# Patient Record
Sex: Female | Born: 1940 | Race: White | Hispanic: No | Marital: Married | Smoking: Never smoker
Health system: Southern US, Community
[De-identification: ages and names within clinical notes are randomized; demographics above are authoritative.]

## PROBLEM LIST (undated history)

## (undated) DIAGNOSIS — I639 Cerebral infarction, unspecified: Secondary | ICD-10-CM

## (undated) DIAGNOSIS — E039 Hypothyroidism, unspecified: Secondary | ICD-10-CM

## (undated) DIAGNOSIS — C801 Malignant (primary) neoplasm, unspecified: Secondary | ICD-10-CM

## (undated) DIAGNOSIS — I7300 Raynaud's syndrome without gangrene: Secondary | ICD-10-CM

## (undated) HISTORY — PX: EYE SURGERY: SHX253

## (undated) HISTORY — DX: Hypothyroidism, unspecified: E03.9

## (undated) HISTORY — DX: Cerebral infarction, unspecified: I63.9

## (undated) HISTORY — DX: Raynaud's syndrome without gangrene: I73.00

---

## 1989-03-25 HISTORY — PX: APPENDECTOMY: SHX54

## 1989-03-25 HISTORY — PX: ABDOMINAL HYSTERECTOMY: SHX81

## 1993-03-25 HISTORY — PX: CHOLECYSTECTOMY: SHX55

## 2008-03-25 HISTORY — PX: BACK SURGERY: SHX140

## 2010-01-23 ENCOUNTER — Ambulatory Visit: Payer: Self-pay | Admitting: Internal Medicine

## 2010-01-23 IMAGING — MG MM CAD SCREENING MAMMO
1 series · 4 of 4 positions shown · non-contrast
Comparison: Mammograms from [facility] dated [DATE] and
[DATE].

REASON FOR EXAM: SCR
COMMENTS:

PROCEDURE:     MAM - MAM DGTL SCREENING MAMMO W/CAD  - [DATE] [DATE]
RESULT:

[Series 9589: R CC · right · 4 of 4 slices shown]
[im 1/4]
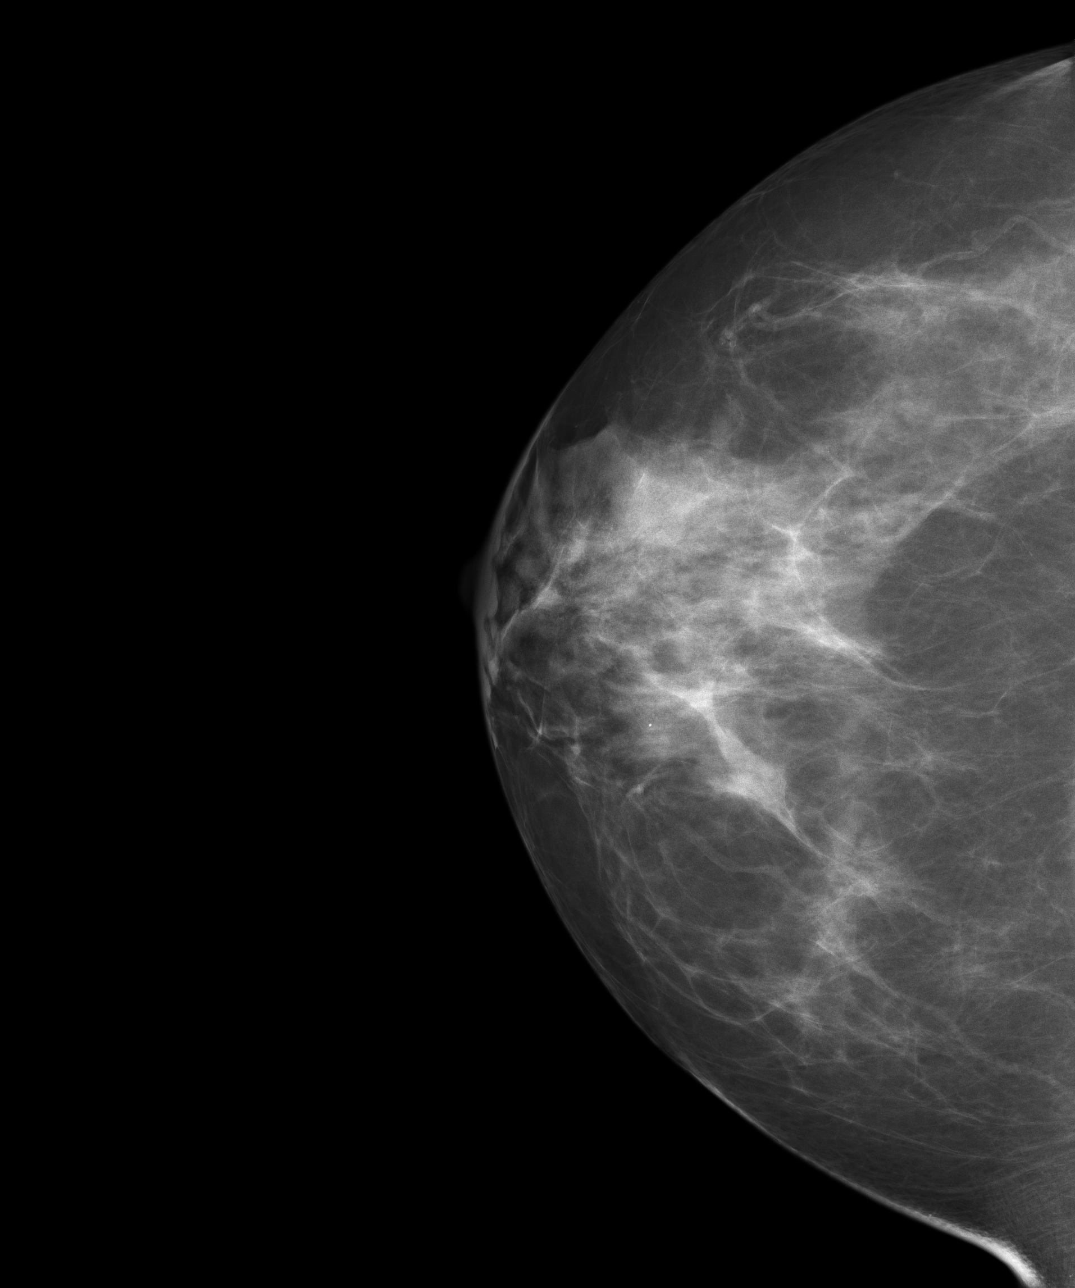
[im 2/4]
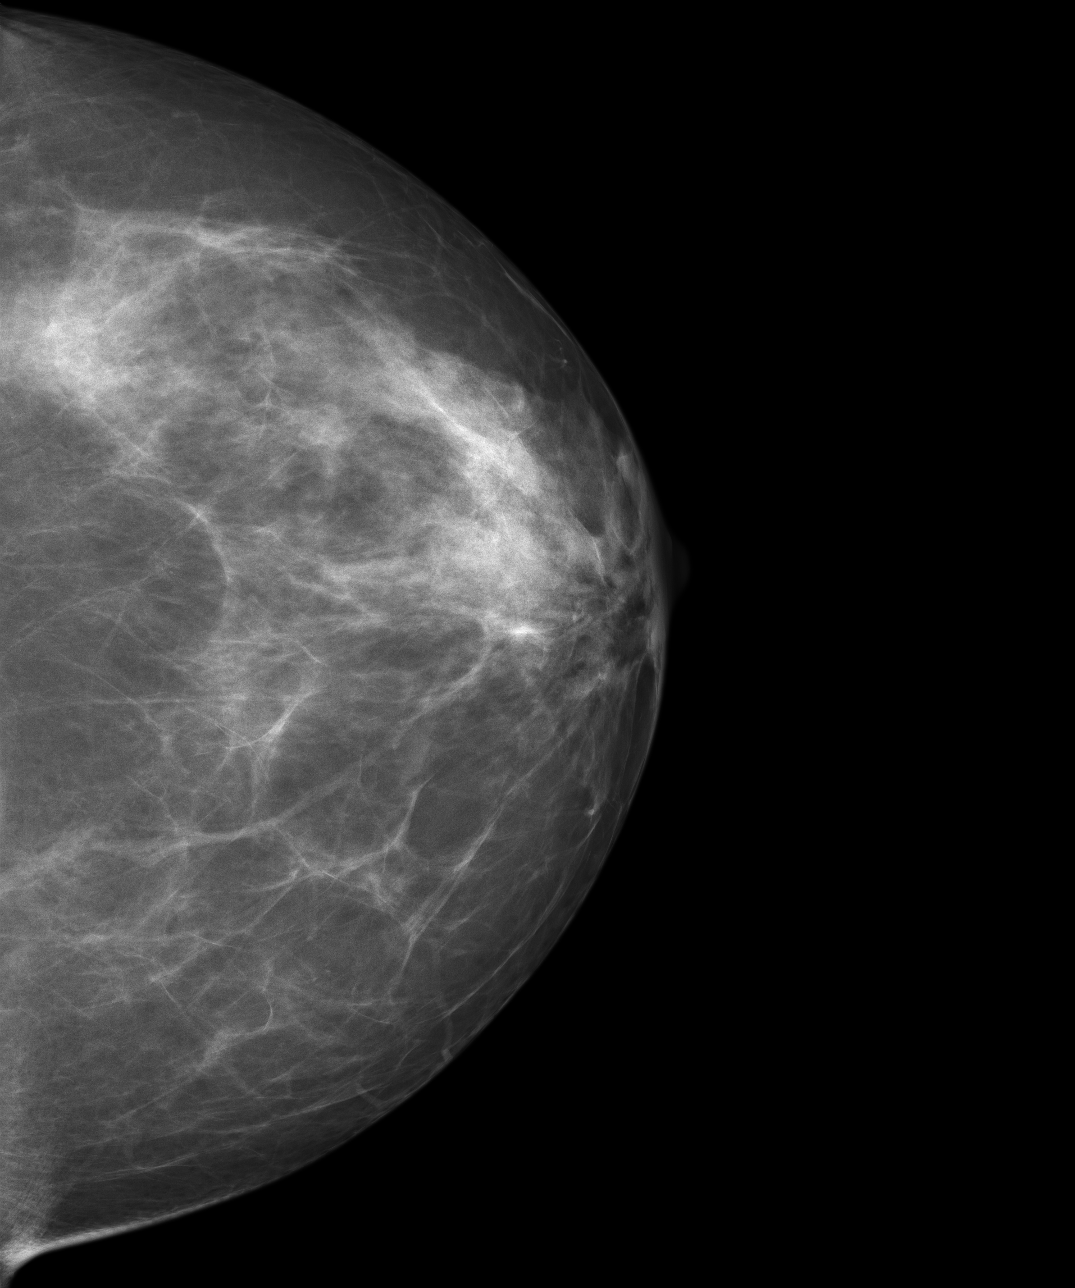
[im 3/4]
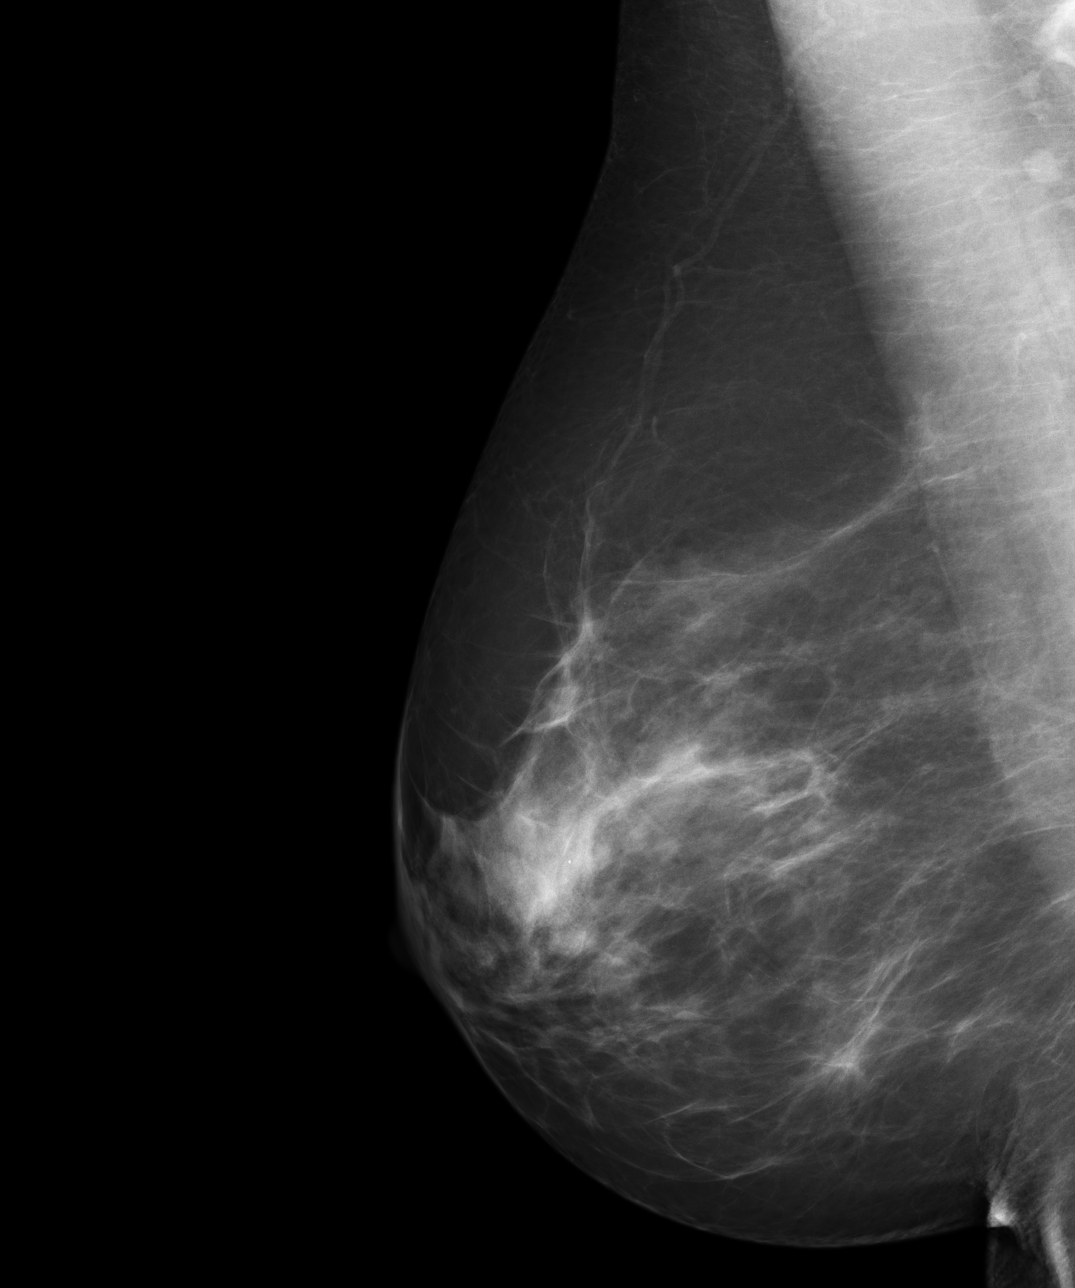
[im 4/4]
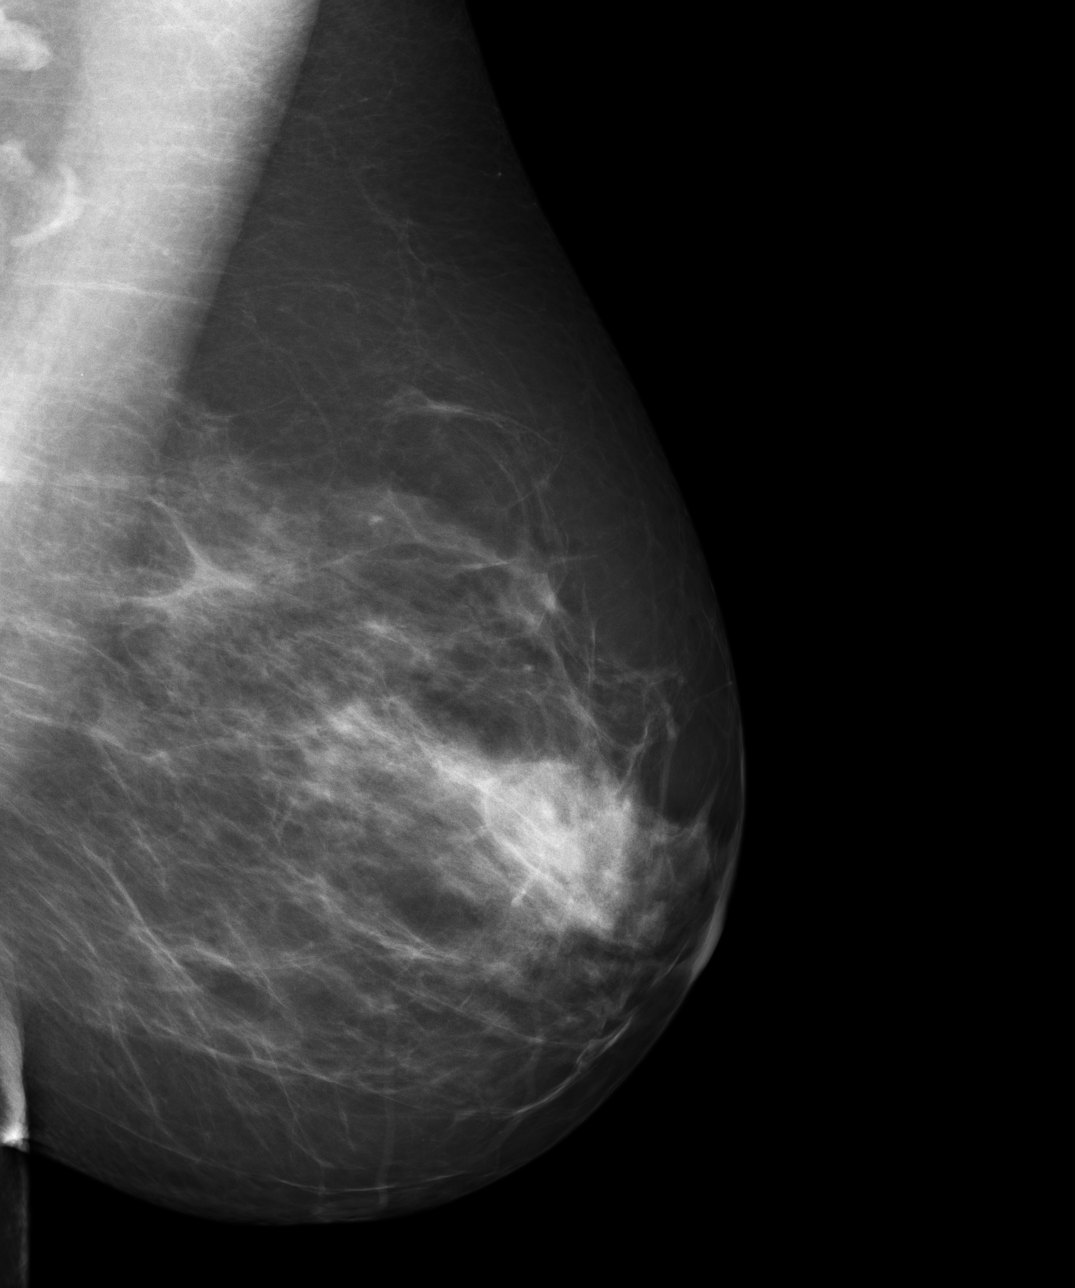

[4 of 4 positions shown; findings below may reference images not displayed]

FINDINGS: The breast tissue is heterogeneously dense.  No new or suspicious
masses or calcifications are identified. No areas of architectural
distortion.
IMPRESSION: BI-RADS: Category 1 - Negative

Continue annual screening mammography.

A NEGATIVE MAMMOGRAM REPORT DOES NOT PRECLUDE BIOPSY OR OTHER EVALUATION OF
A CLINICALLY PALPABLE OR OTHERWISE SUSPICIOUS MASS OR LESION. BREAST CANCER
MAY NOT BE DETECTED BY MAMMOGRAPHY IN UP TO 10% OF CASES.

## 2011-01-29 ENCOUNTER — Ambulatory Visit: Payer: Self-pay | Admitting: Internal Medicine

## 2011-01-29 IMAGING — MG MM CAD SCREENING MAMMO
1 series · 4 of 4 positions shown · non-contrast
Comparison: none

REASON FOR EXAM: scr mammo
COMMENTS:

PROCEDURE:     MAM - MAM DGTL SCREENING MAMMO W/CAD  - [DATE]  [DATE]
RESULT:

[Series 6373: R CC · right · 4 of 4 slices shown]
[im 1/4]
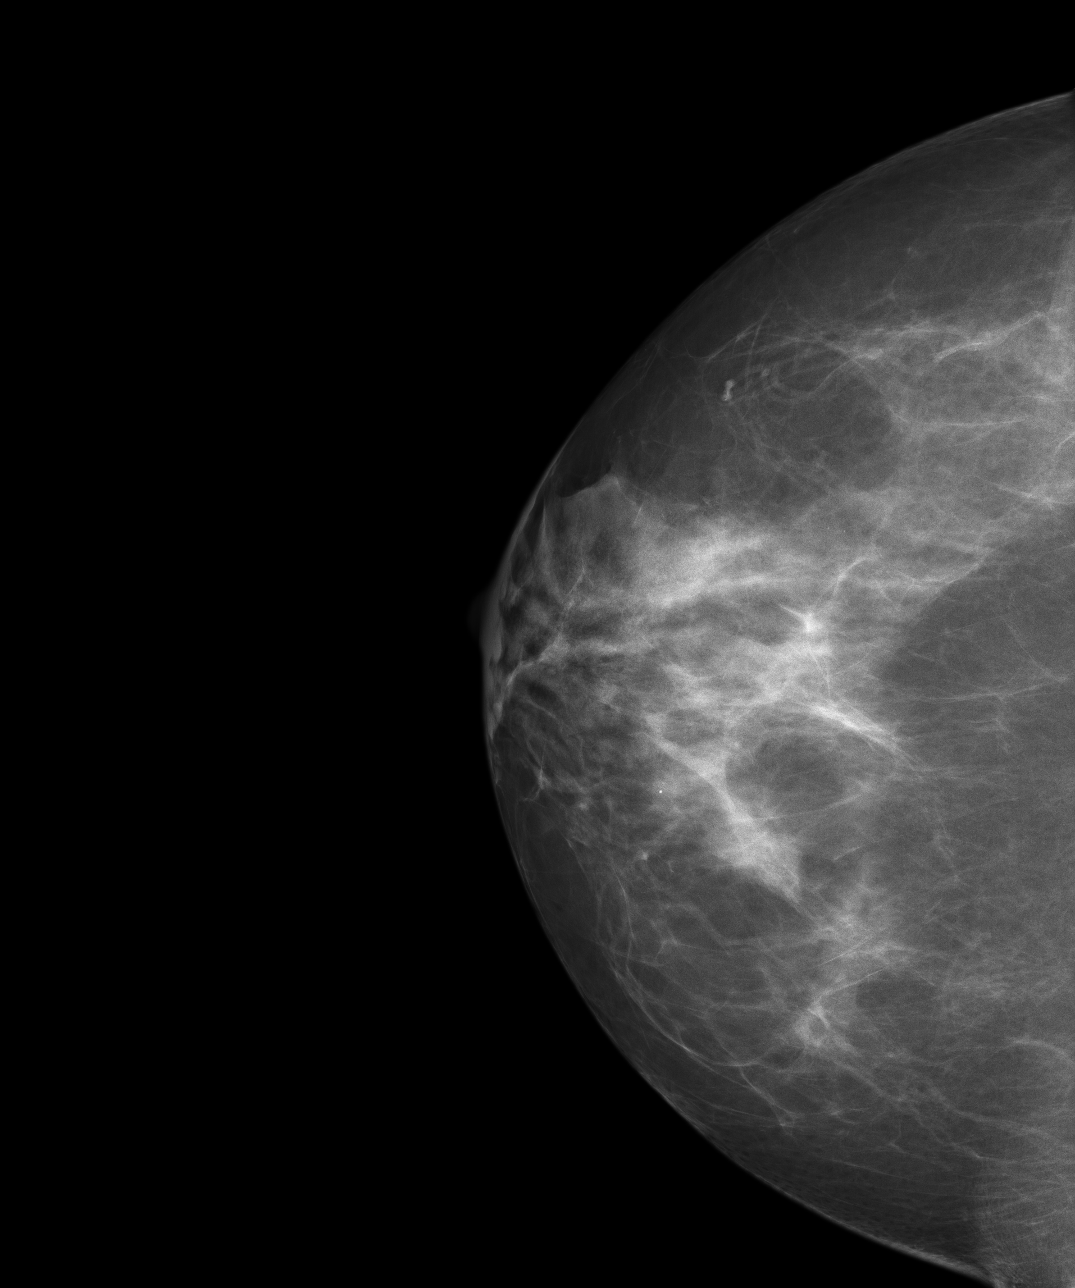
[im 2/4]
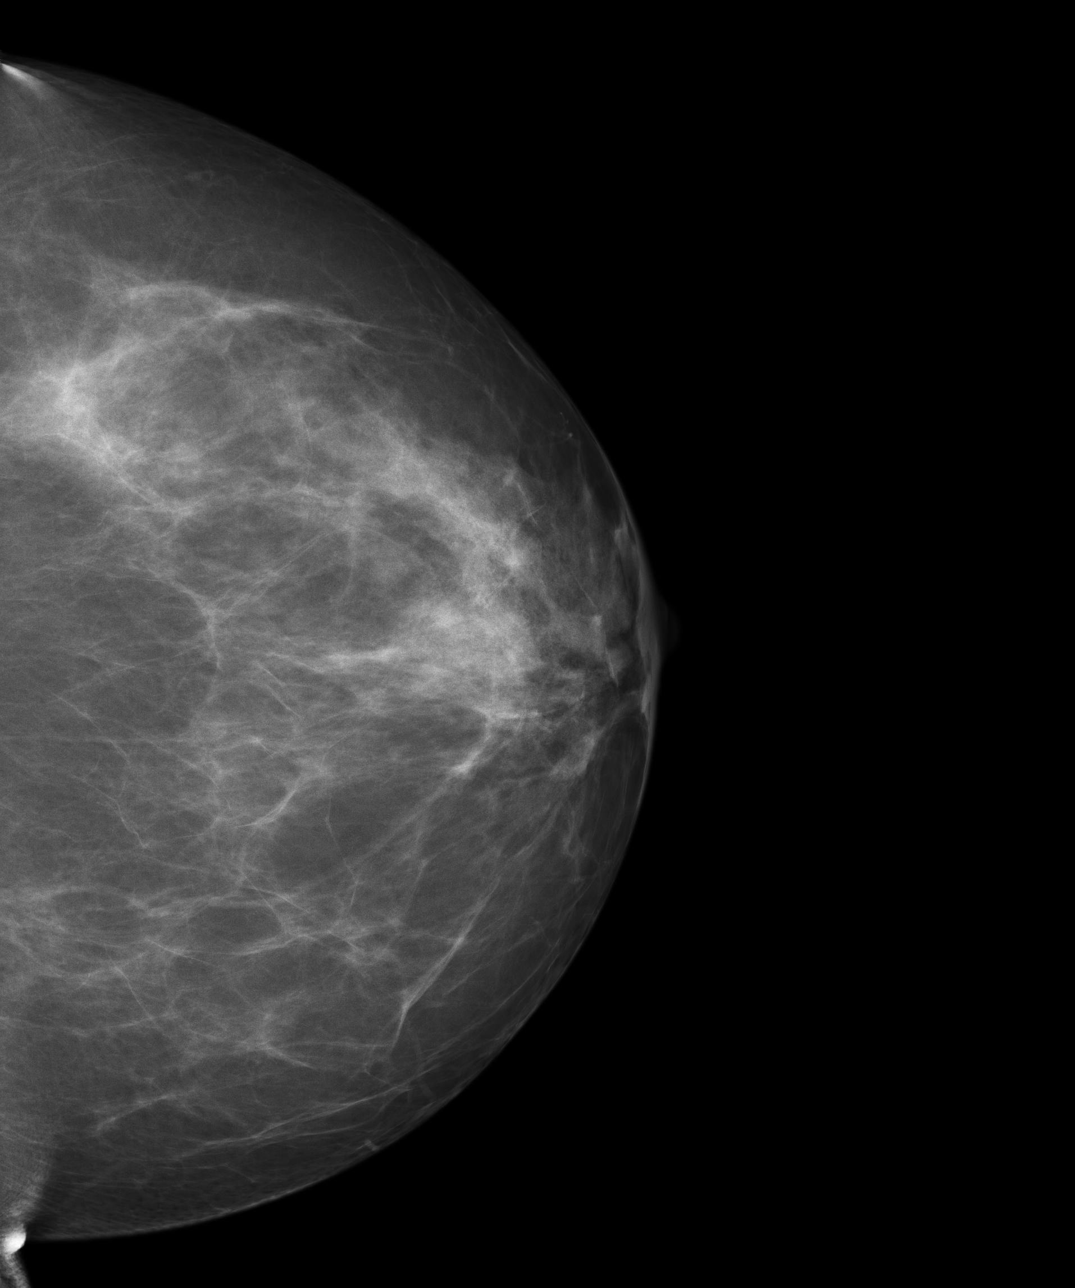
[im 3/4]
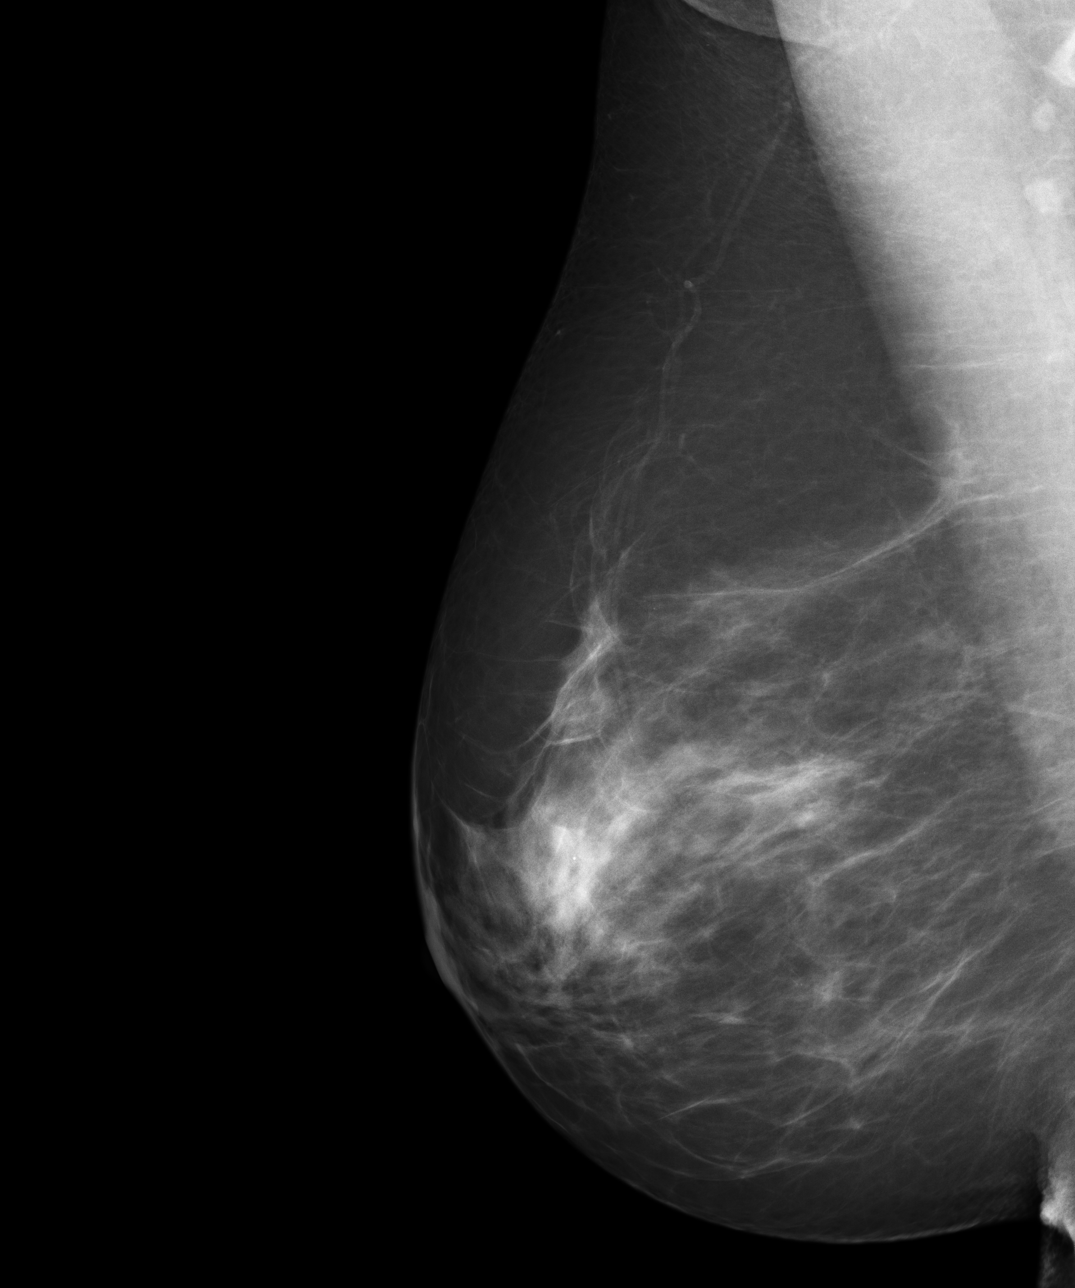
[im 4/4]
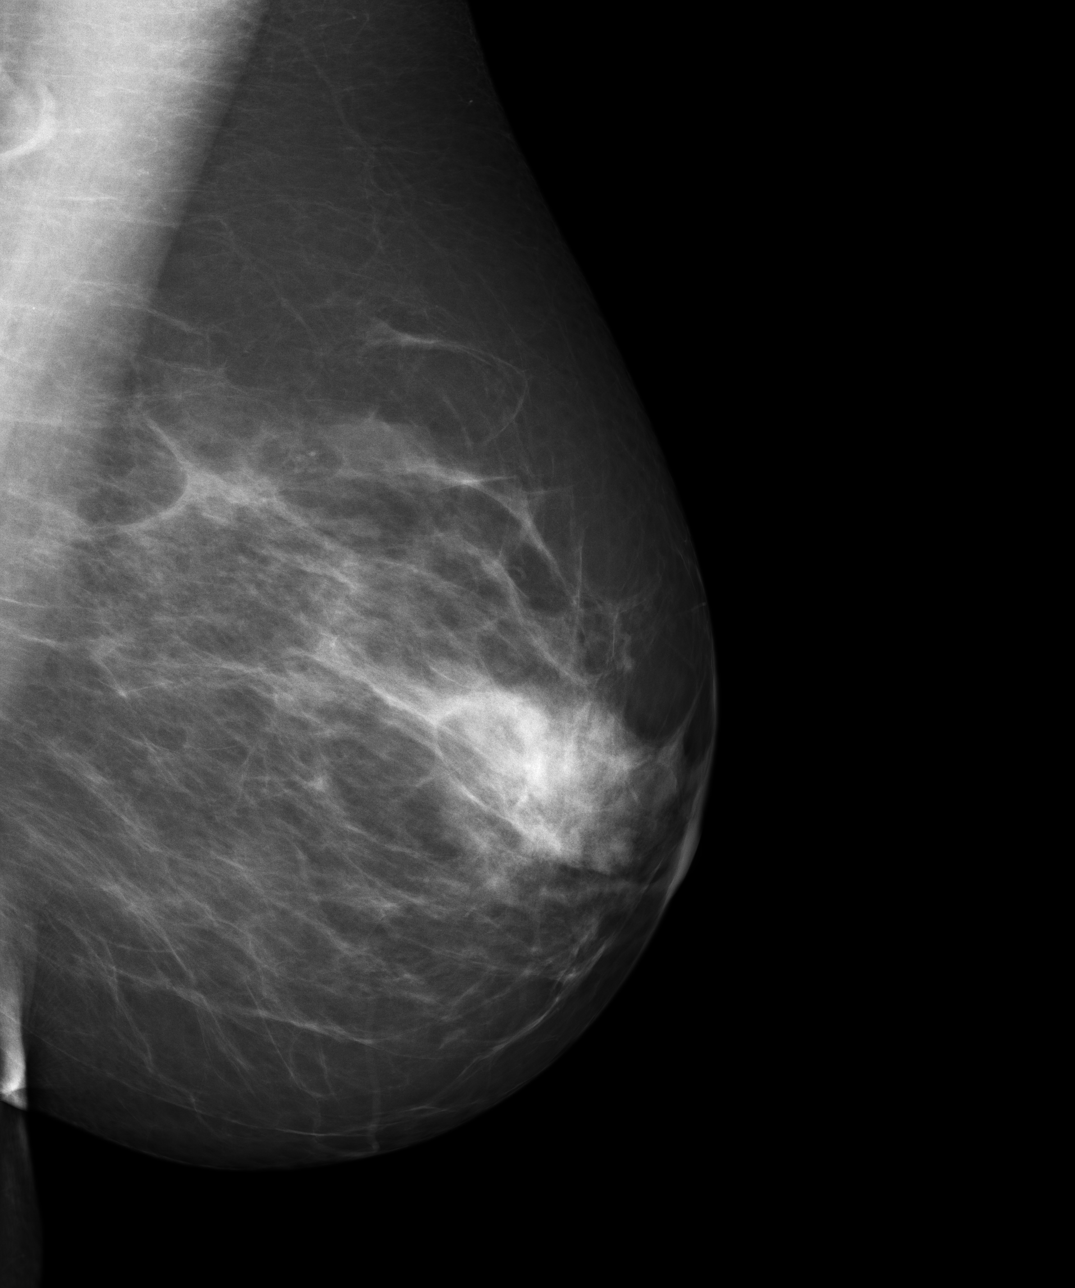

[4 of 4 positions shown; findings below may reference images not displayed]

FINDINGS: Comparison is made to the previous images from [facility]
[REDACTED] at [facility] in [place] [facility]
dated [DATE] and [DATE] as well as to digital images from [HOSPITAL] dated
[DATE].

The breasts exhibit a heterogeneous, moderate to dense parenchymal pattern
without demonstrating an area of developing parenchymal density, dominant
mass, malignant calcification or architectural distortion.
IMPRESSION: Stable, benign appearing bilateral mammogram.

BI-RADS: Category 2 - Benign Findings

RECOMMENDATION:  Please continue to encourage annual mammographic follow-up.

Thank you for this opportunity to contribute to the care of your patient.

A NEGATIVE MAMMOGRAM REPORT DOES NOT PRECLUDE BIOPSY OR OTHER EVALUATION OF
A CLINICALLY PALPABLE OR OTHERWISE SUSPICIOUS MASS OR LESION. BREAST CANCER
MAY NOT BE DETECTED BY MAMMOGRAPHY IN UP TO 10% OF CASES.

## 2011-05-06 ENCOUNTER — Ambulatory Visit: Payer: Self-pay | Admitting: Gastroenterology

## 2011-05-08 LAB — PATHOLOGY REPORT

## 2011-06-27 ENCOUNTER — Ambulatory Visit: Payer: Self-pay | Admitting: Podiatry

## 2012-02-04 ENCOUNTER — Ambulatory Visit: Payer: Self-pay | Admitting: Internal Medicine

## 2012-02-04 IMAGING — MG MM CAD SCREENING MAMMO
1 series · 5 of 5 positions shown · non-contrast
Comparison: none

REASON FOR EXAM: SCR MAMMO NO ORDER
COMMENTS:

PROCEDURE:     MAM - MAM DGTL SCRN MAM NO ORDER W/CAD  - [DATE]  [DATE]
RESULT:     Heterogeneously dense breasts. No mass. No pathologic
calcifications. CAD evaluation nonfocal. No change from prior exams.

[R CC · right · 5 of 5 slices shown]
[im 1/5]
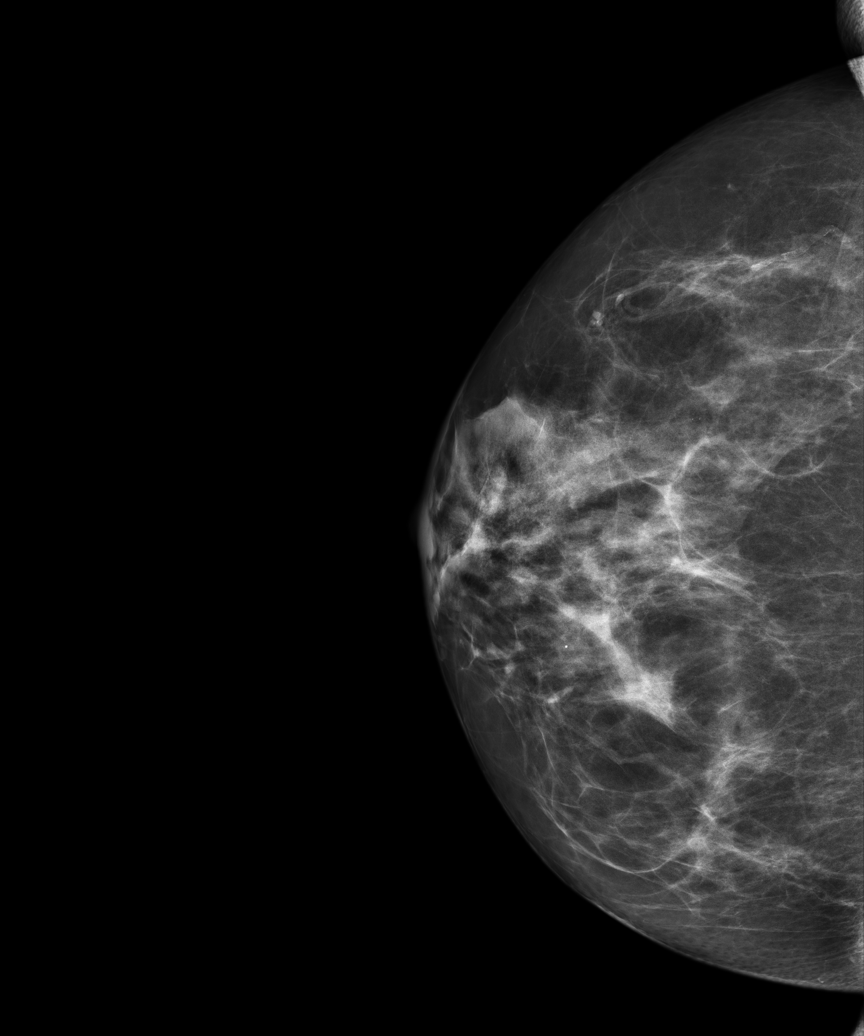
[im 2/5]
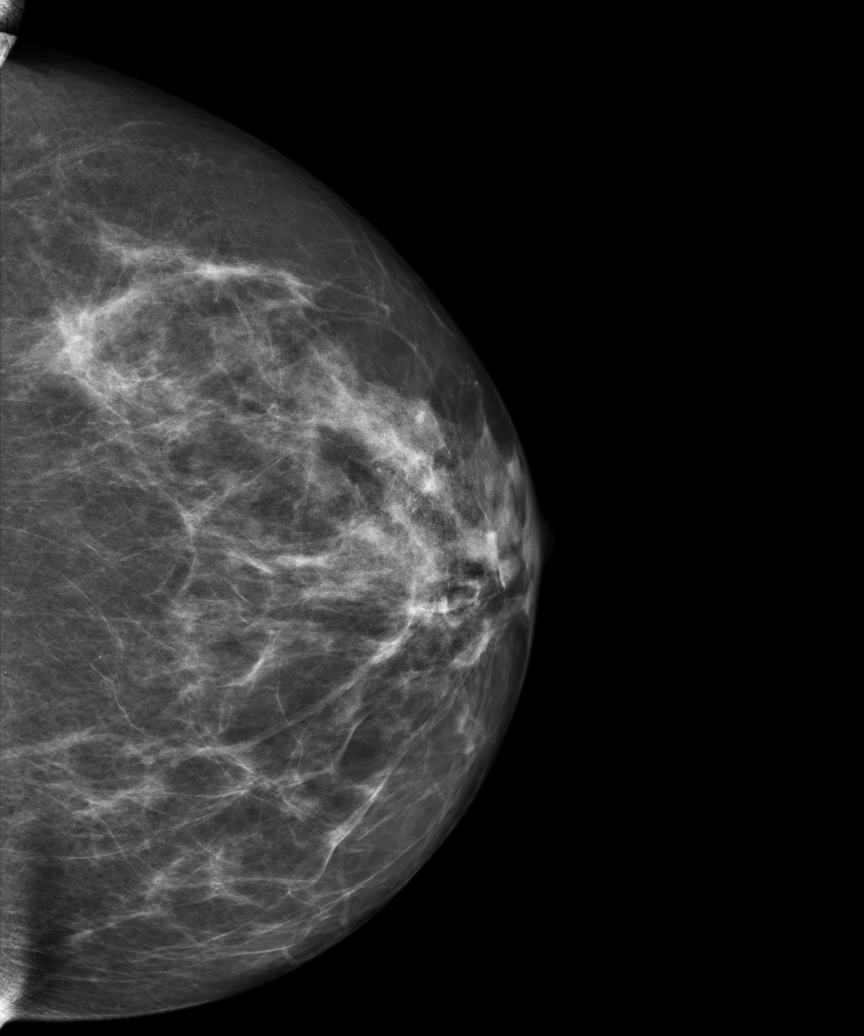
[im 3/5]
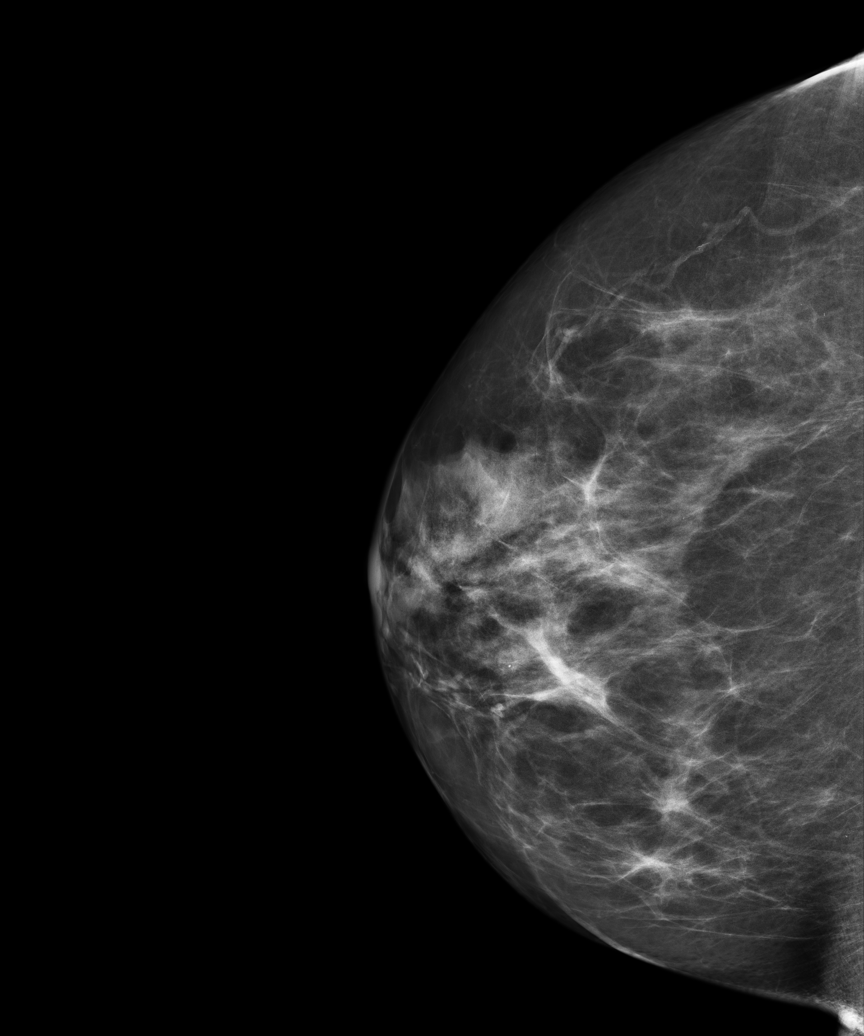
[im 4/5]
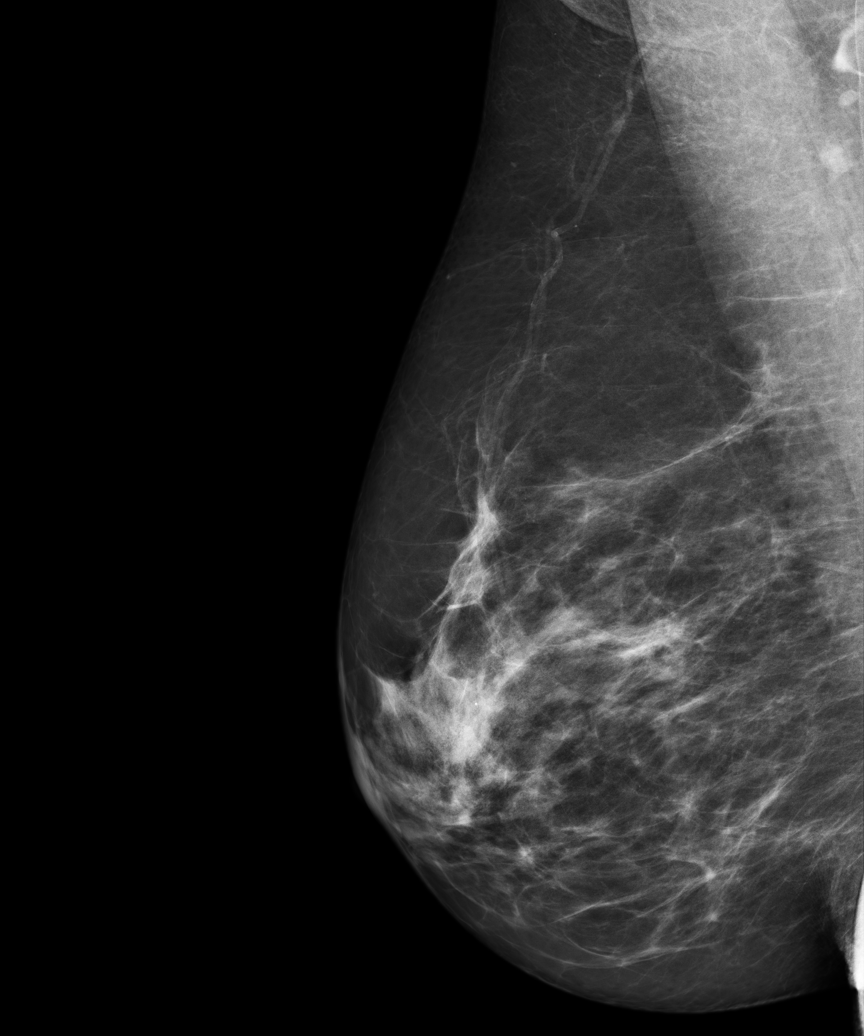
[im 5/5]
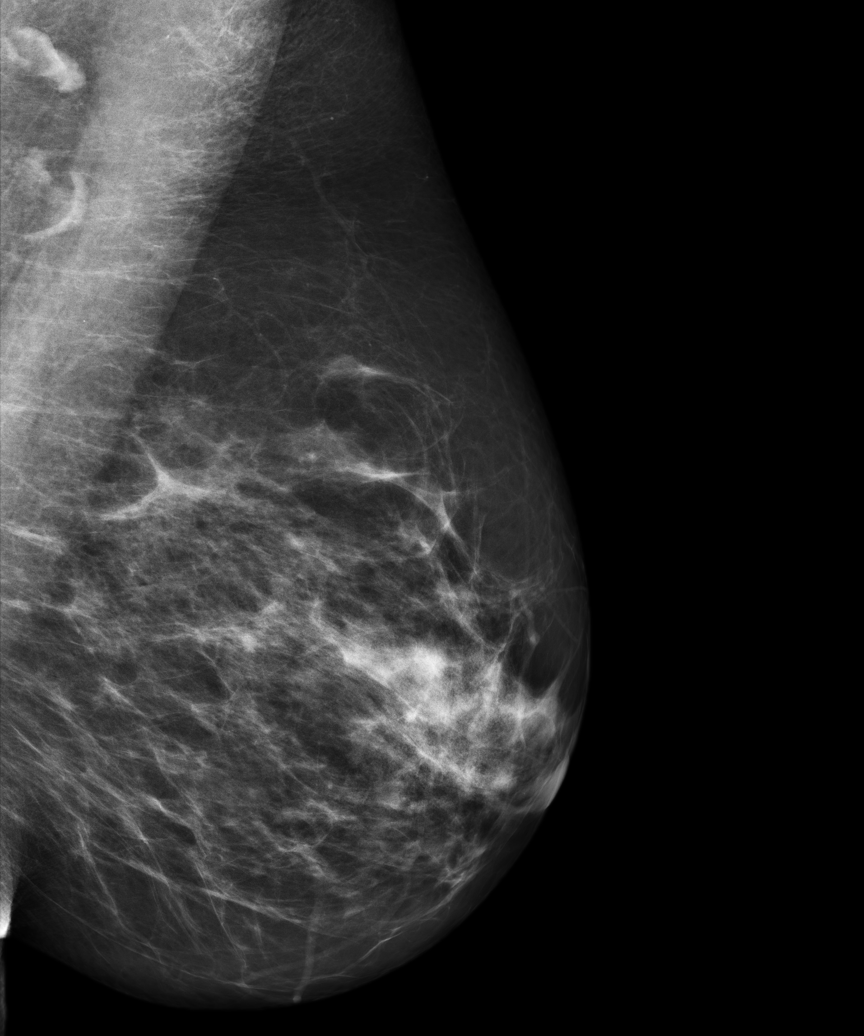

[5 of 5 positions shown; findings below may reference images not displayed]

IMPRESSION: Benign exam.

BI-RADS: Category 2- Benign Finding

A NEGATIVE MAMMOGRAM REPORT DOES NOT PRECLUDE BIOPSY OR OTHER EVALUATION OF
A CLINICALLY PALPABLE OR OTHERWISE SUSPICIOUS MASS OR LESION. BREAST CANCER
MAY NOT BE DETECTED IN UP TO 10% OF CASES.

## 2012-06-24 ENCOUNTER — Ambulatory Visit: Payer: Self-pay | Admitting: Family Medicine

## 2013-02-04 ENCOUNTER — Ambulatory Visit: Payer: Self-pay | Admitting: Internal Medicine

## 2013-02-04 IMAGING — MG MM DIGITAL SCREENING BILAT W/ CAD
1 series · 4 of 4 positions shown · non-contrast
Comparison: Previous exam(s).

CLINICAL DATA: Screening.

EXAM:
DIGITAL SCREENING BILATERAL MAMMOGRAM WITH CAD

[R CC · right · 4 of 4 slices shown]
[im 1/4]
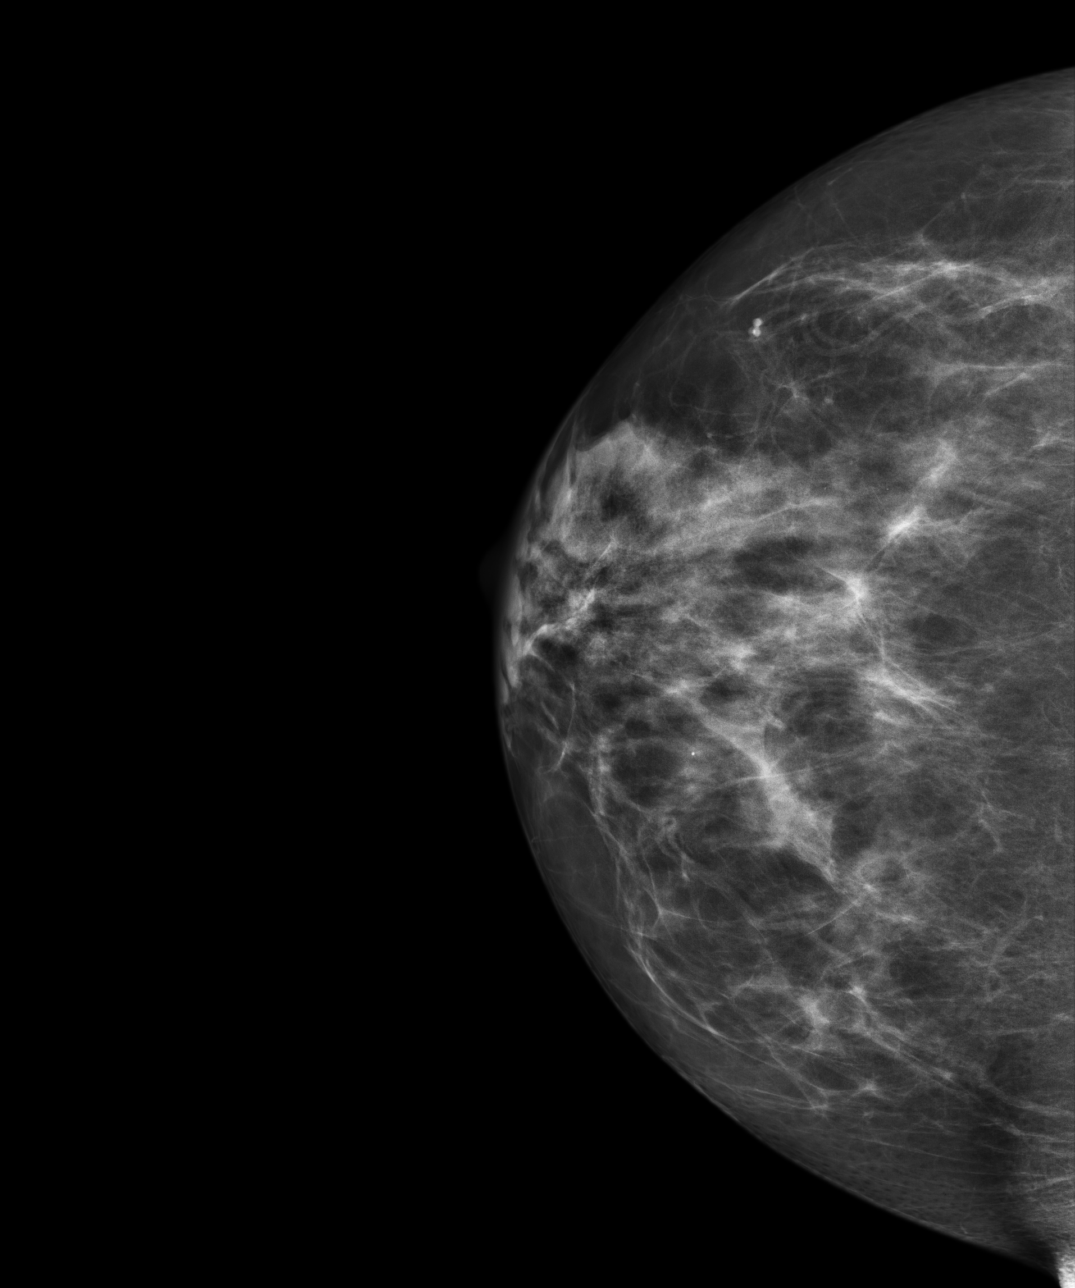
[im 2/4]
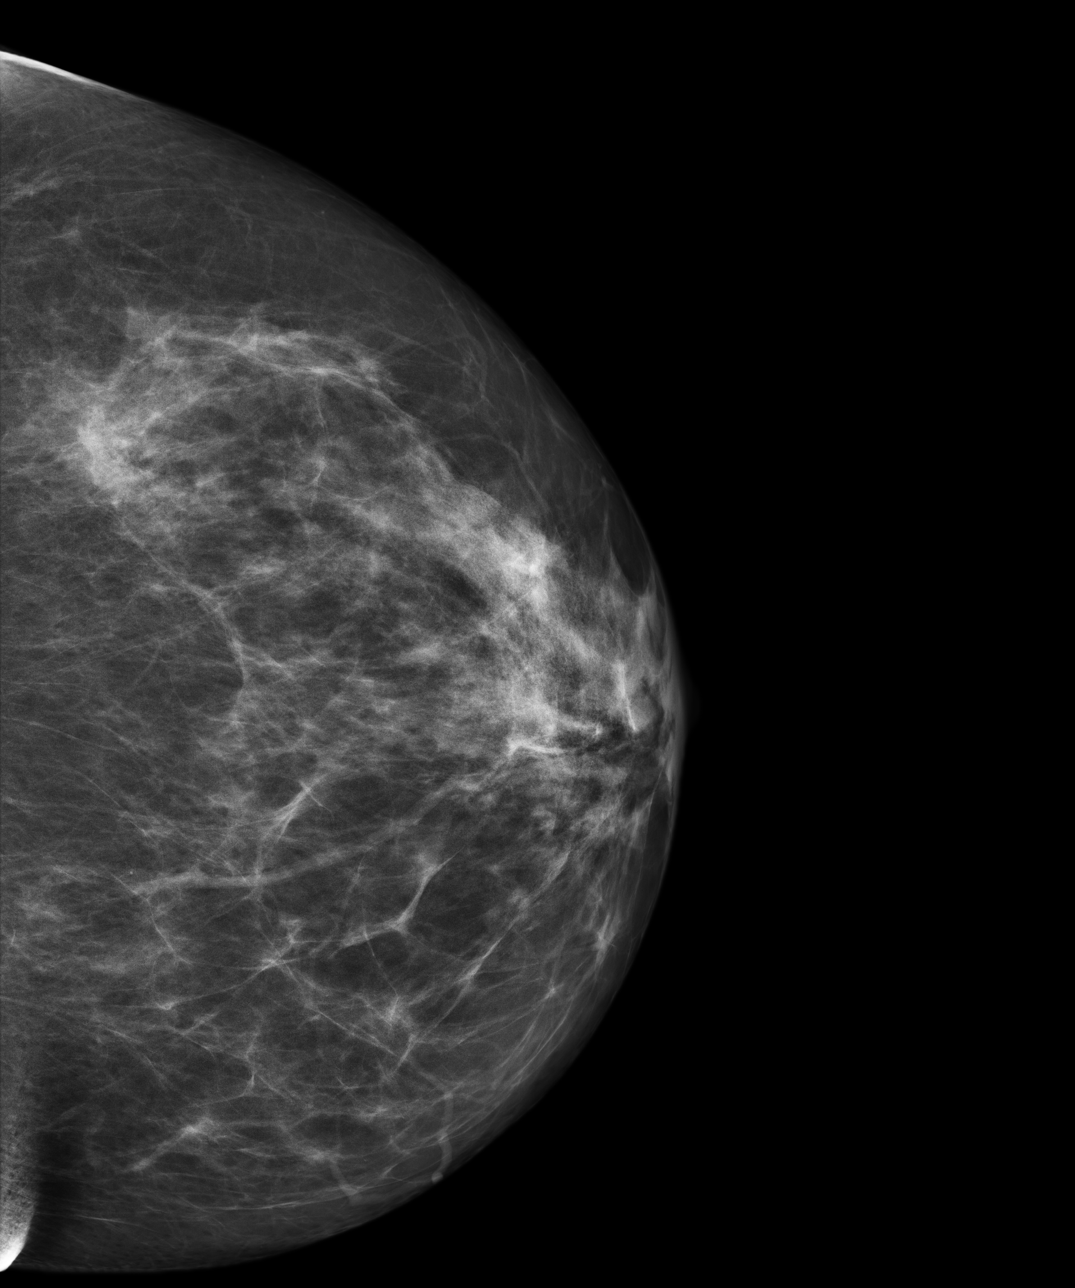
[im 3/4]
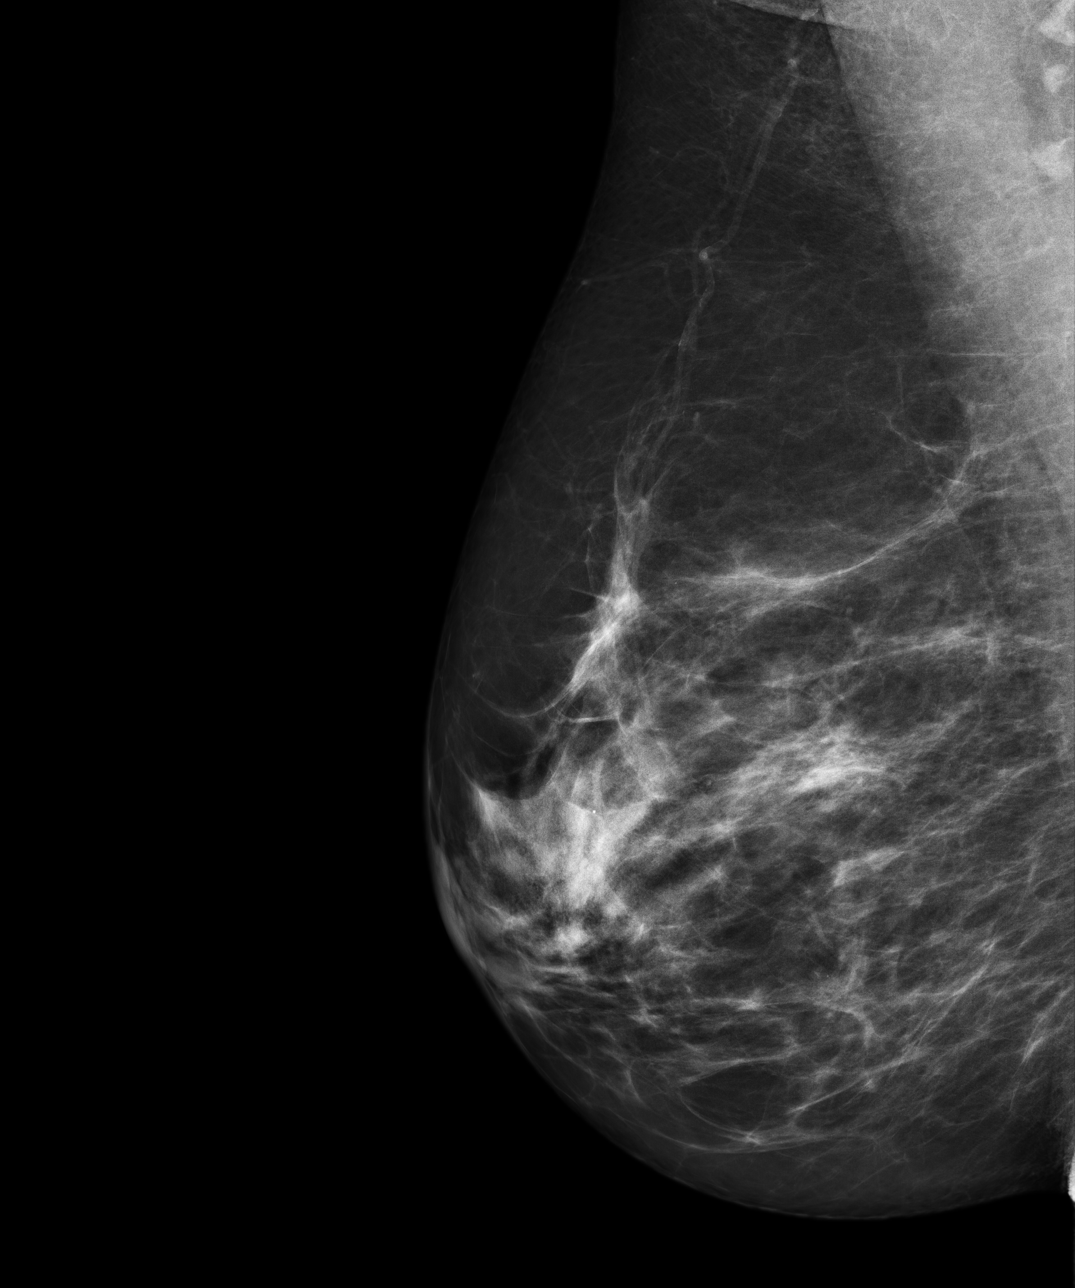
[im 4/4]
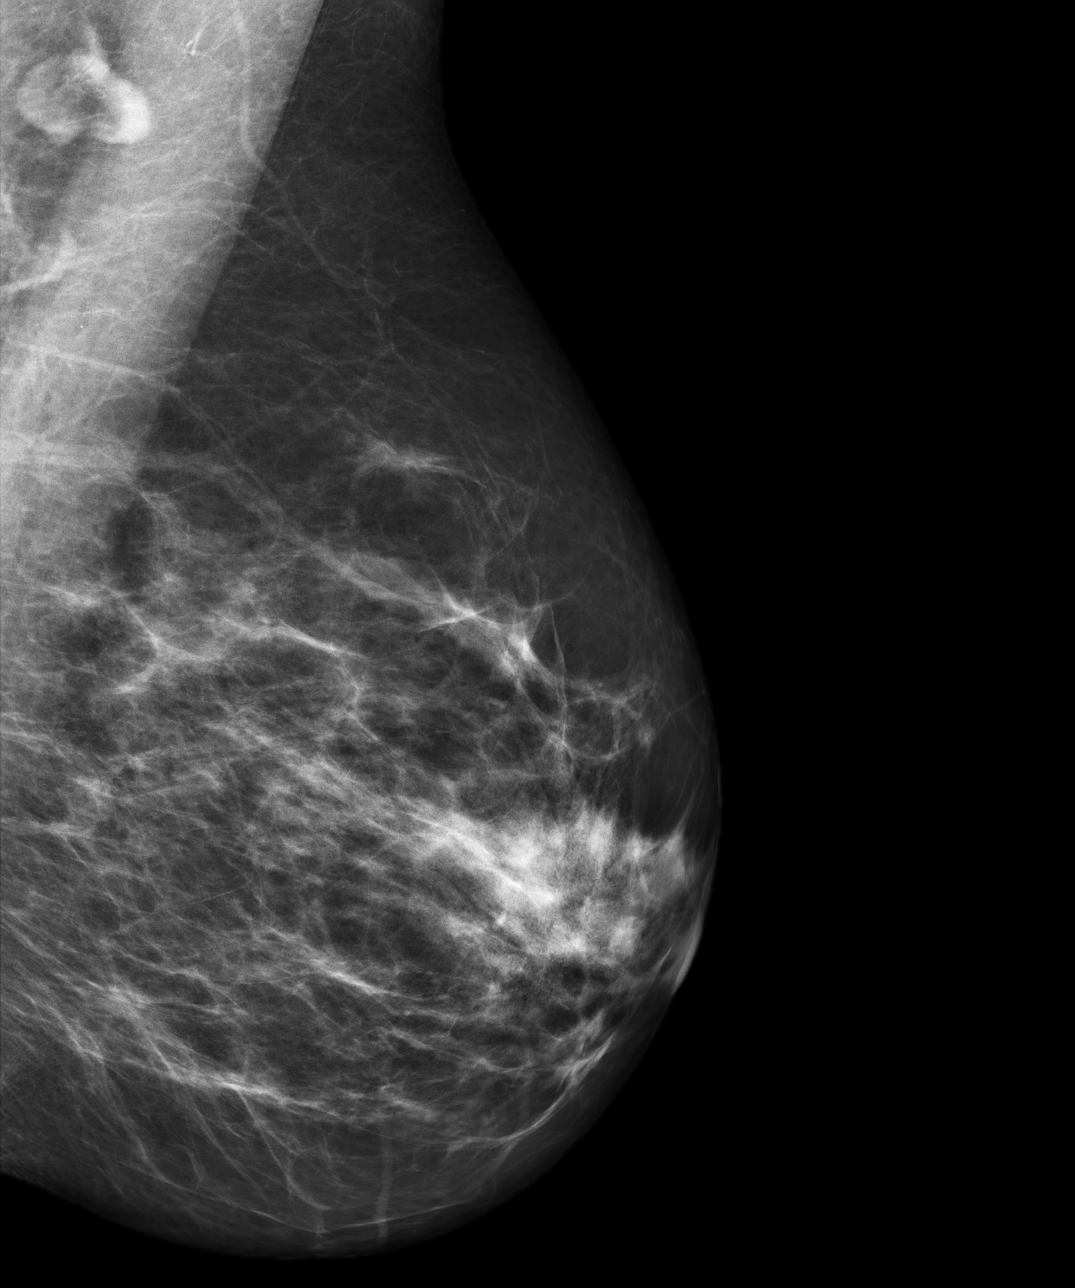

[4 of 4 positions shown; findings below may reference images not displayed]

ACR Breast Density Category b: There are scattered areas of
fibroglandular density.
FINDINGS: There are no findings suspicious for malignancy. Images were
processed with CAD.
IMPRESSION: No mammographic evidence of malignancy. A result letter of this
screening mammogram will be mailed directly to the patient.

RECOMMENDATION:
Screening mammogram in one year. (Code:[HN])

BI-RADS CATEGORY  1: Negative

## 2013-06-14 DIAGNOSIS — E039 Hypothyroidism, unspecified: Secondary | ICD-10-CM | POA: Insufficient documentation

## 2013-06-14 DIAGNOSIS — E785 Hyperlipidemia, unspecified: Secondary | ICD-10-CM | POA: Insufficient documentation

## 2013-06-14 DIAGNOSIS — I10 Essential (primary) hypertension: Secondary | ICD-10-CM | POA: Insufficient documentation

## 2013-06-14 DIAGNOSIS — I7300 Raynaud's syndrome without gangrene: Secondary | ICD-10-CM | POA: Insufficient documentation

## 2013-06-14 DIAGNOSIS — M1990 Unspecified osteoarthritis, unspecified site: Secondary | ICD-10-CM | POA: Insufficient documentation

## 2014-02-07 ENCOUNTER — Ambulatory Visit: Payer: Self-pay | Admitting: Internal Medicine

## 2014-02-07 IMAGING — MG MM DIGITAL SCREENING BILAT W/ CAD
4 series · 4 of 4 positions shown · non-contrast
Comparison: Previous exam(s).

CLINICAL DATA: Screening.

EXAM:
DIGITAL SCREENING BILATERAL MAMMOGRAM WITH CAD

[L MLO]
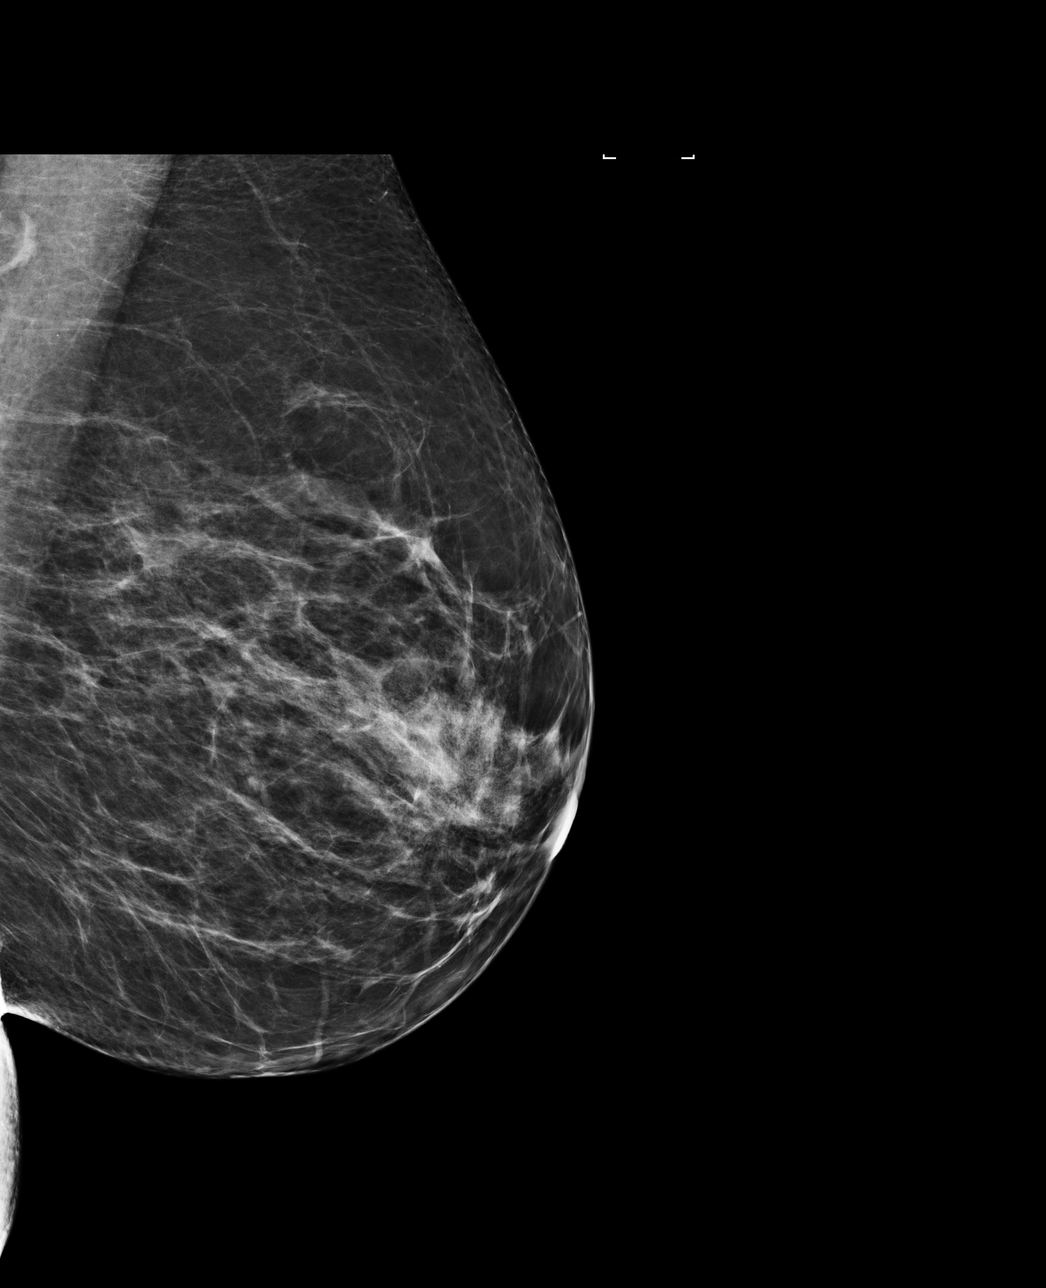

[L CC]
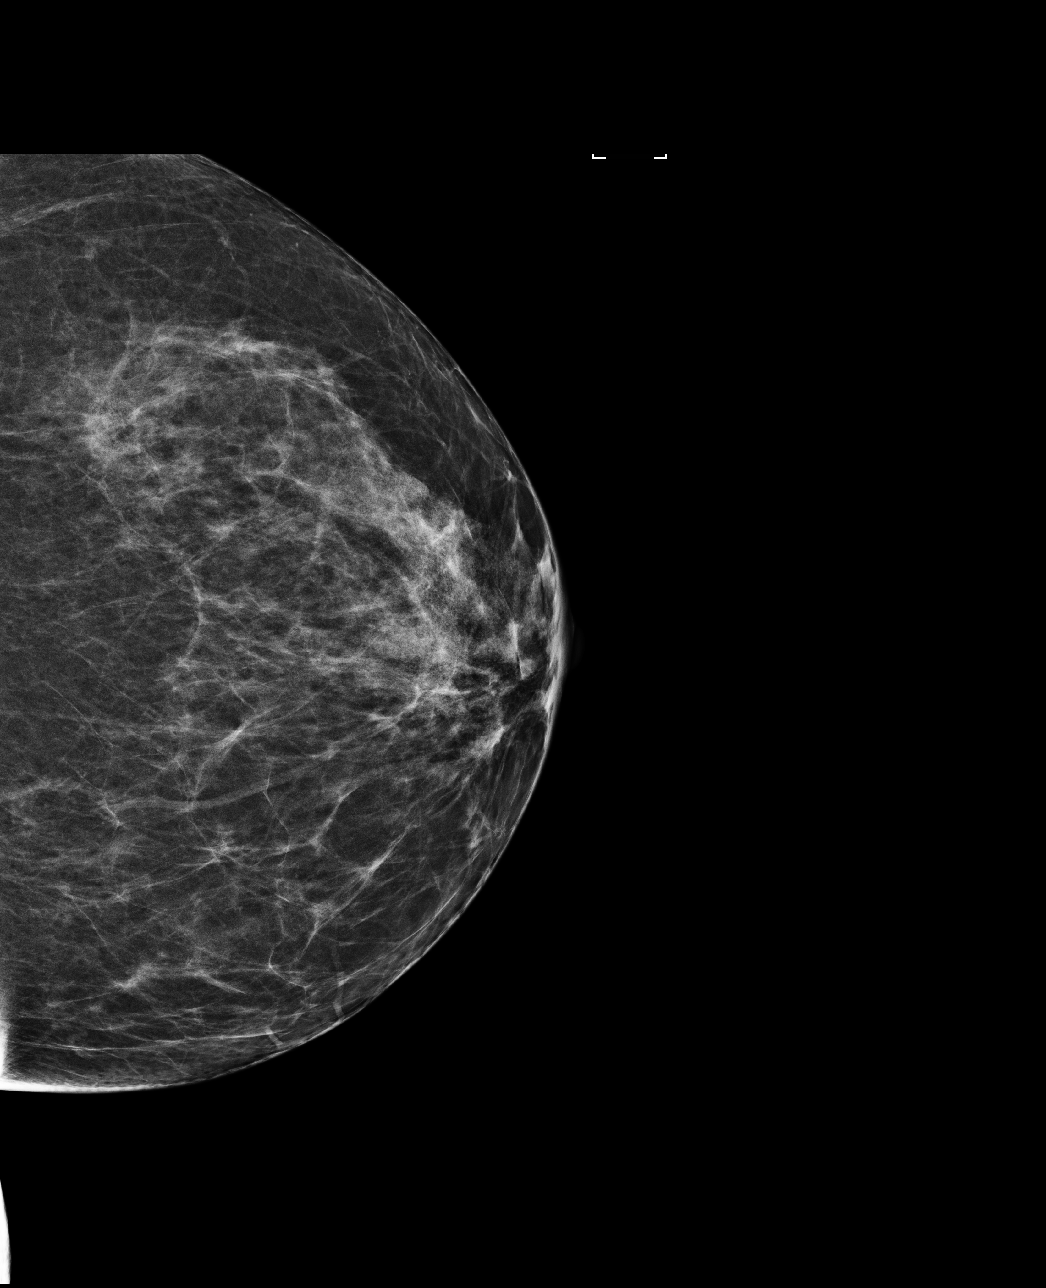

[R MLO]
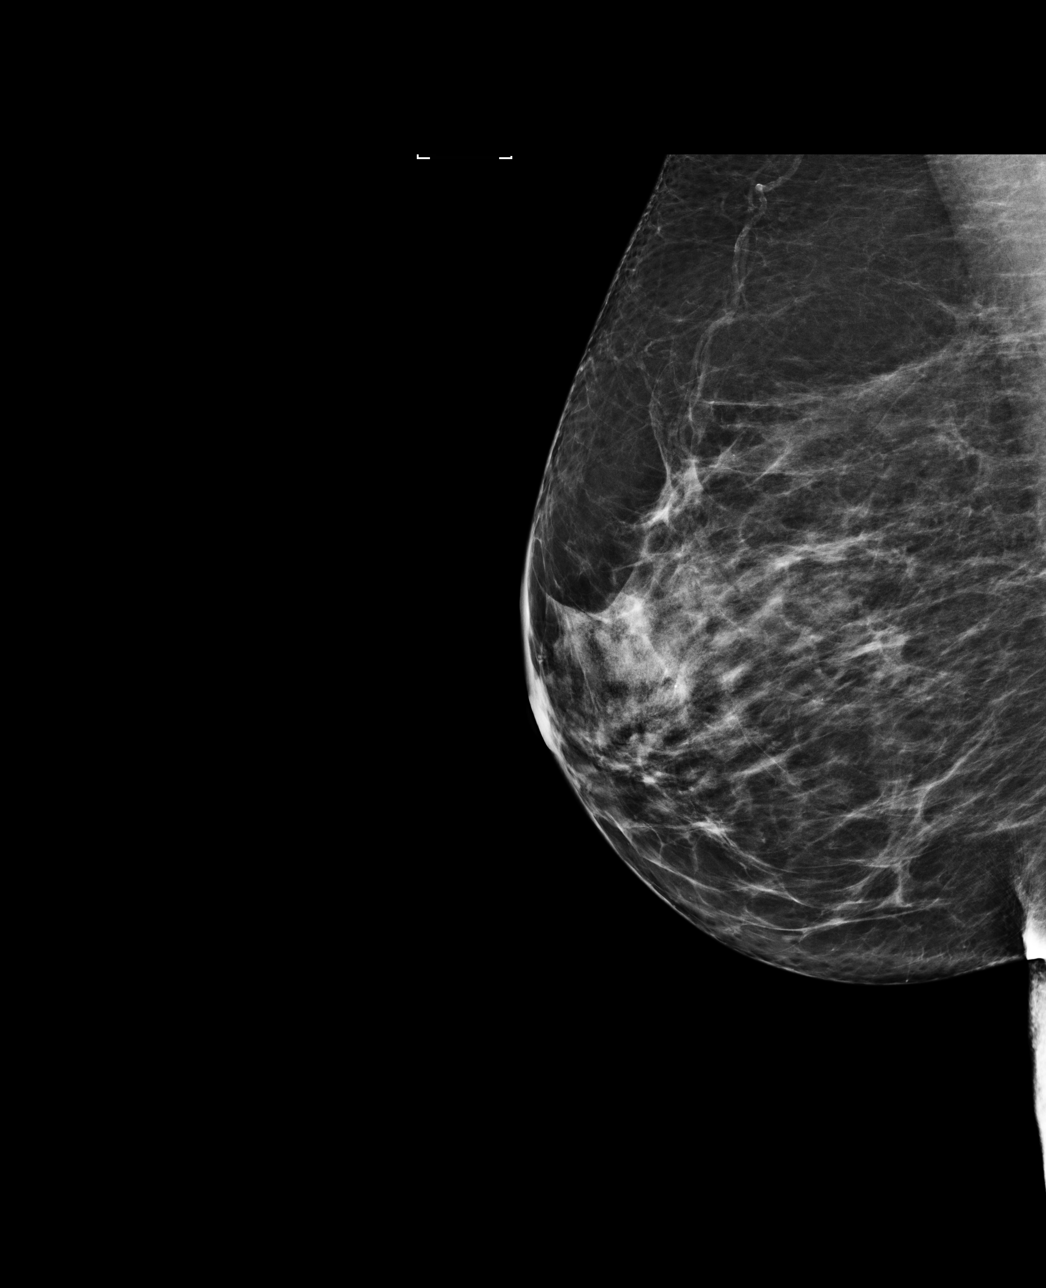

[R CC]
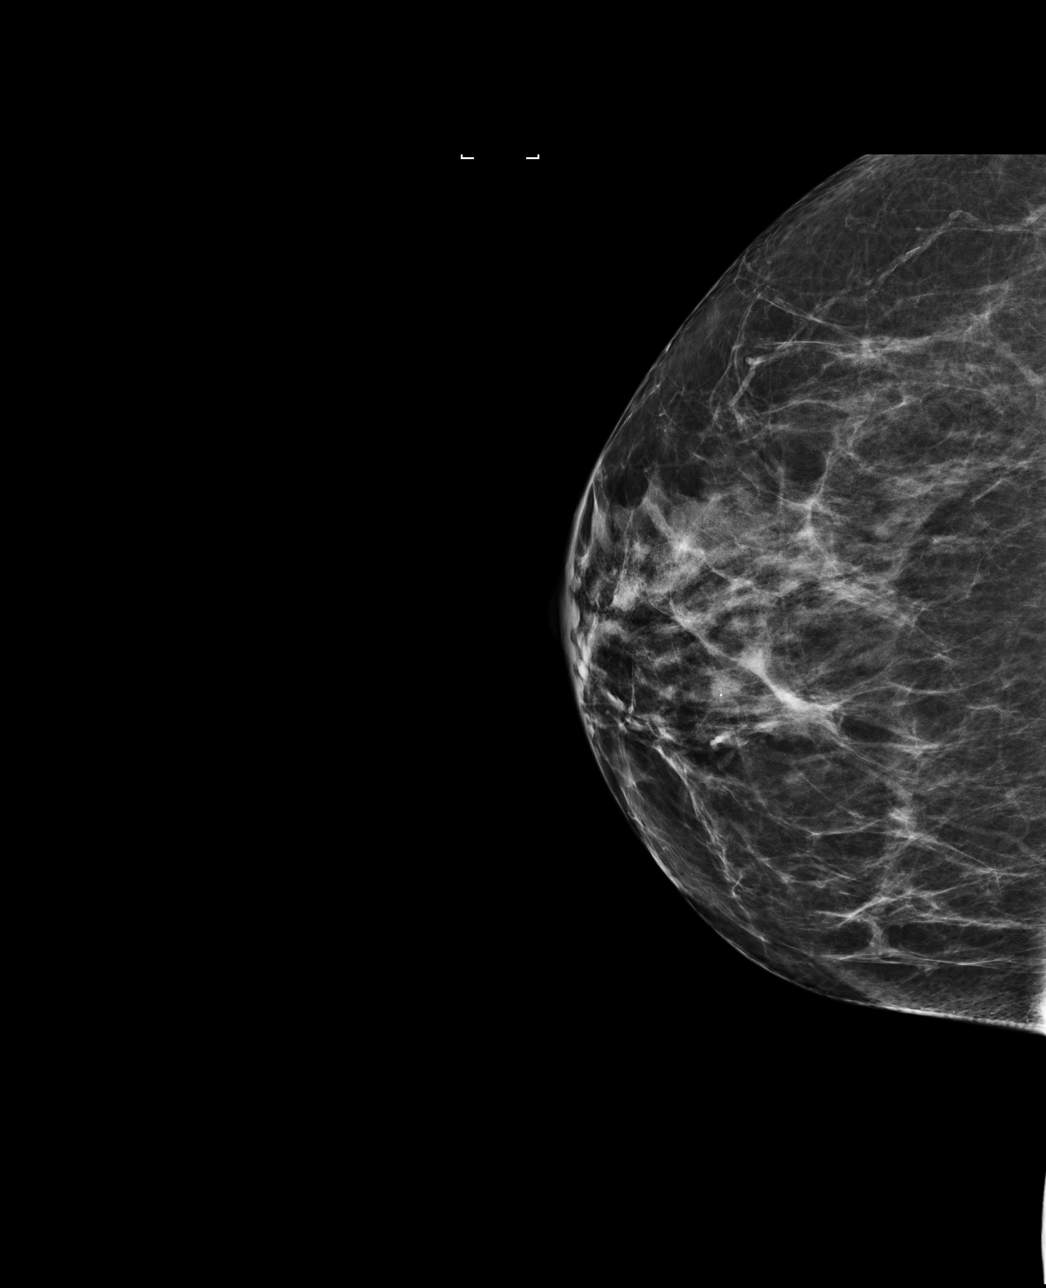

[4 of 4 positions shown; findings below may reference images not displayed]

ACR Breast Density Category c: The breast tissue is heterogeneously
dense, which may obscure small masses.
FINDINGS: There are no findings suspicious for malignancy. Images were
processed with CAD.
IMPRESSION: No mammographic evidence of malignancy. A result letter of this
screening mammogram will be mailed directly to the patient.

RECOMMENDATION:
Screening mammogram in one year. (Code:[0J])

BI-RADS CATEGORY  1: Negative.

## 2014-05-23 DIAGNOSIS — Z85828 Personal history of other malignant neoplasm of skin: Secondary | ICD-10-CM | POA: Insufficient documentation

## 2014-09-29 ENCOUNTER — Other Ambulatory Visit: Payer: Self-pay | Admitting: Internal Medicine

## 2014-09-29 DIAGNOSIS — Z1231 Encounter for screening mammogram for malignant neoplasm of breast: Secondary | ICD-10-CM

## 2015-02-13 ENCOUNTER — Ambulatory Visit
Admission: RE | Admit: 2015-02-13 | Discharge: 2015-02-13 | Disposition: A | Discharge status: Home / Self Care | Payer: Medicare PPO | Source: Ambulatory Visit | Attending: Internal Medicine | Admitting: Internal Medicine

## 2015-02-13 ENCOUNTER — Other Ambulatory Visit: Payer: Self-pay | Admitting: Internal Medicine

## 2015-02-13 DIAGNOSIS — Z1231 Encounter for screening mammogram for malignant neoplasm of breast: Secondary | ICD-10-CM | POA: Insufficient documentation

## 2015-02-13 HISTORY — DX: Malignant (primary) neoplasm, unspecified: C80.1

## 2015-02-13 IMAGING — MG MM SCREENING BREAST TOMO BILATERAL
8 of 12 series · 8 of 28 positions shown · non-contrast
Comparison: Previous exam(s).

CLINICAL DATA: Screening.

EXAM:
DIGITAL SCREENING BILATERAL MAMMOGRAM WITH 3D TOMO WITH CAD

[L MLO synth-2D]
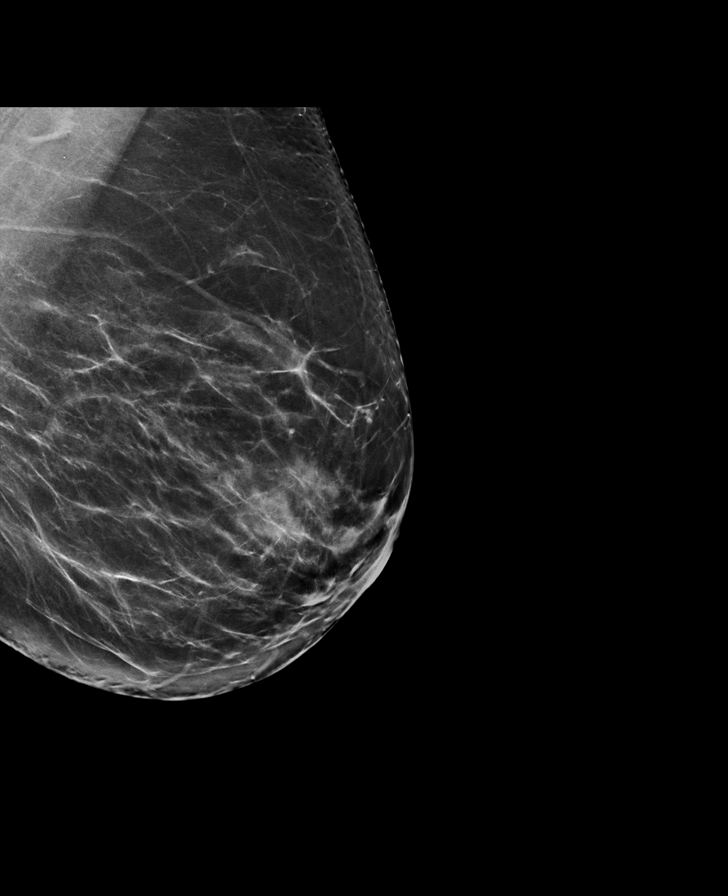

[R MLO synth-2D]
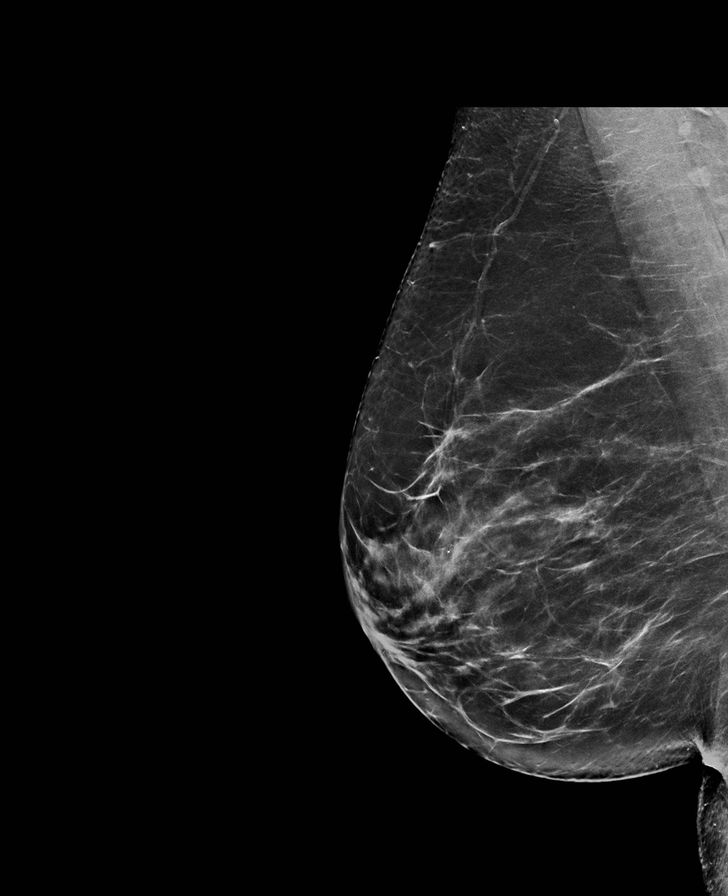

[L CC synth-2D]
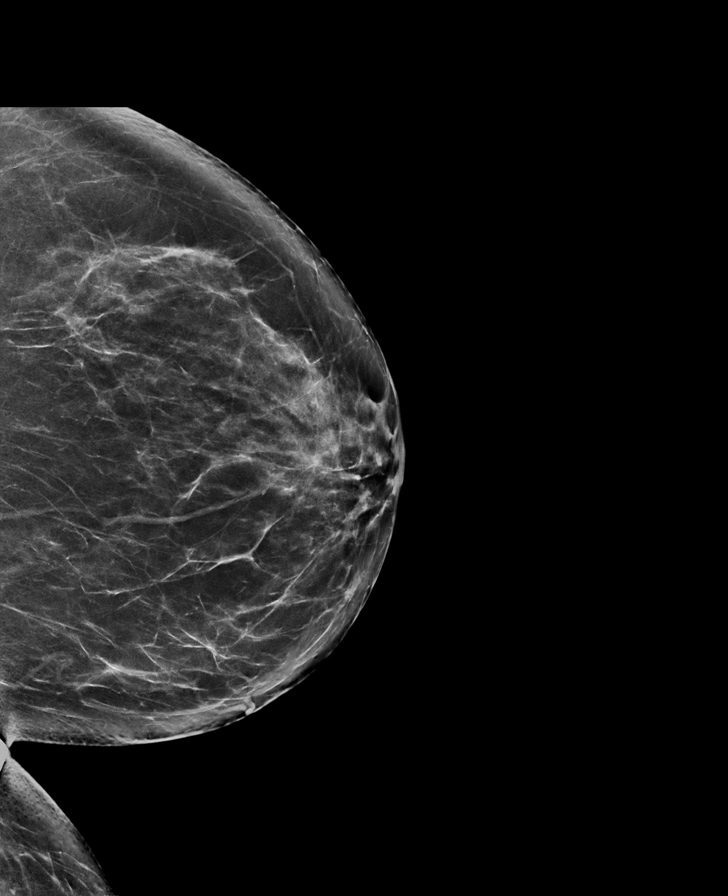

[L MLO]
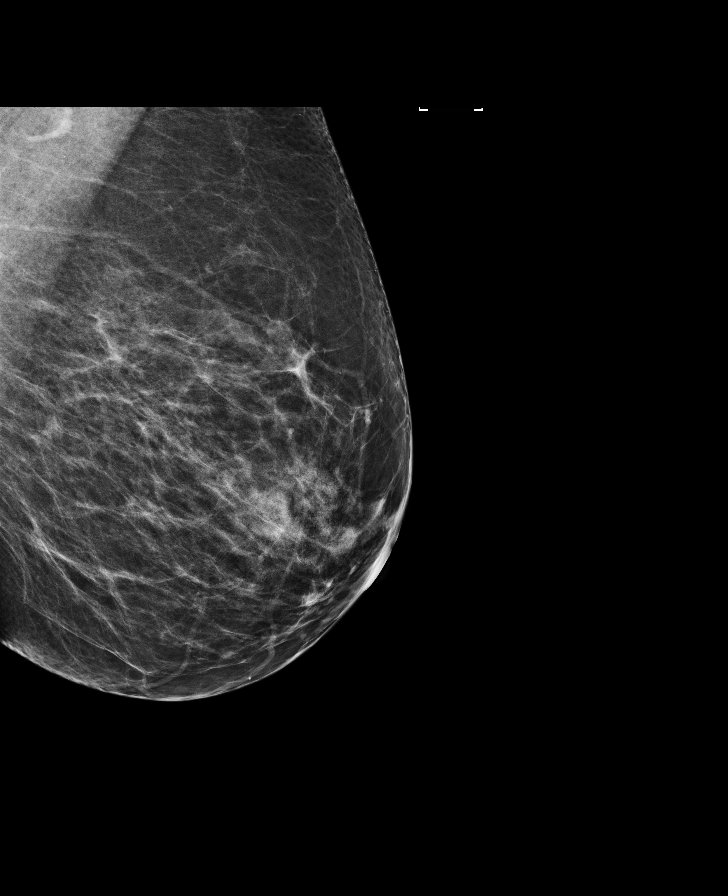

[R CC synth-2D]
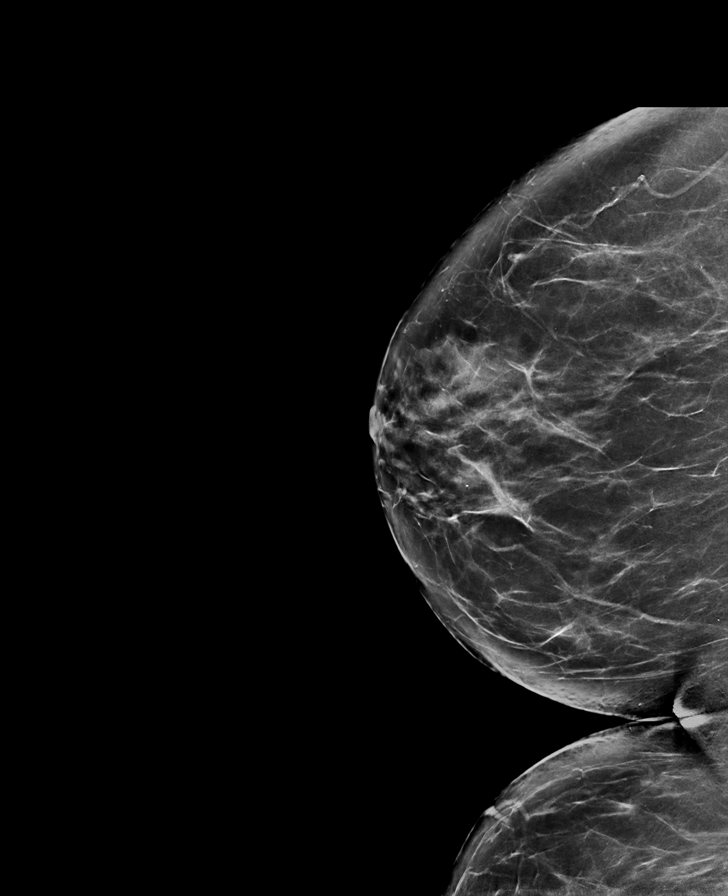

[L CC]
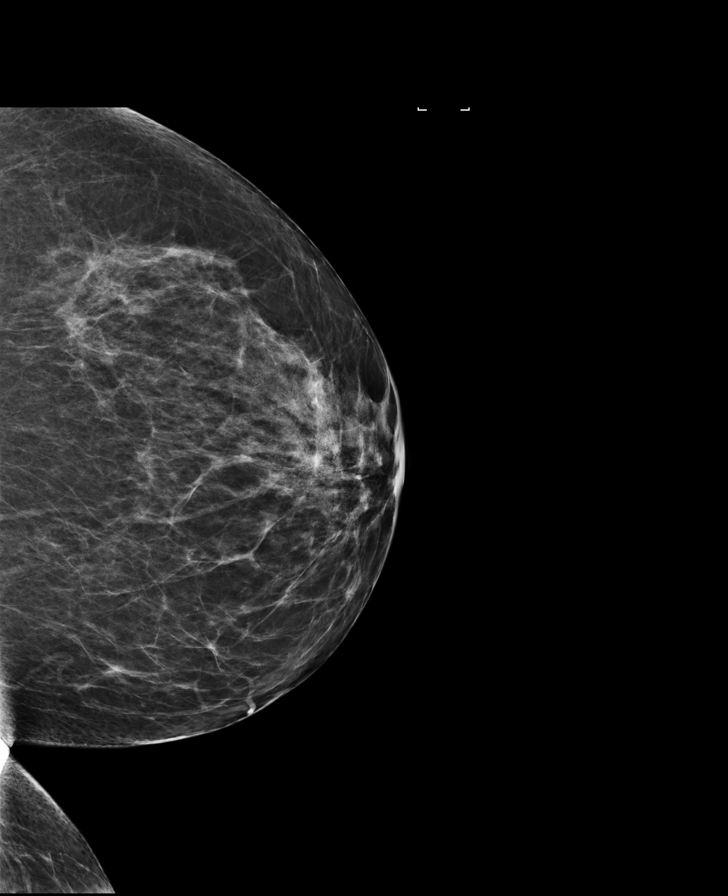

[R MLO]
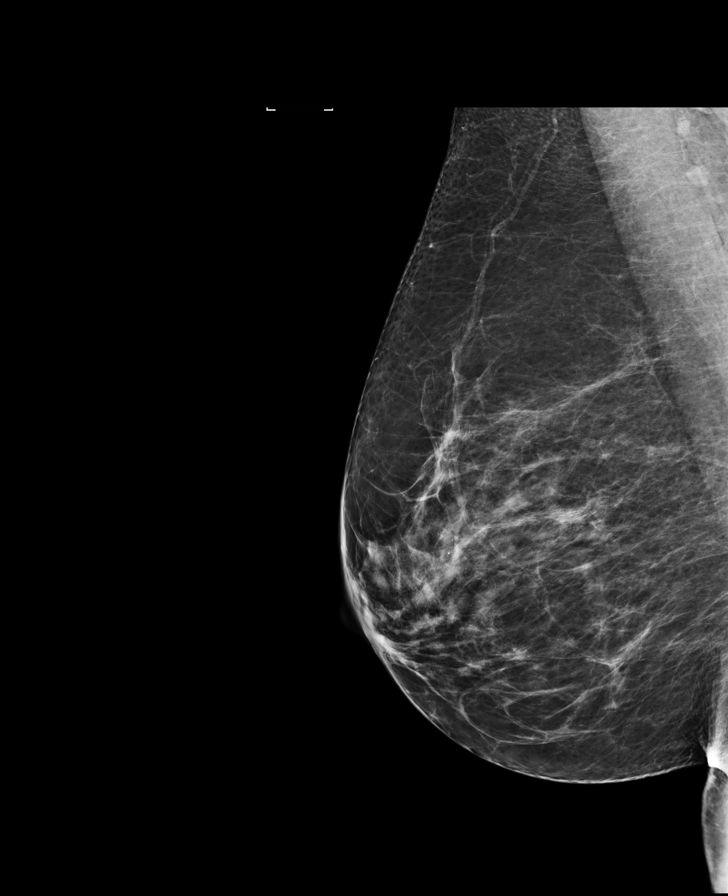

[R CC]
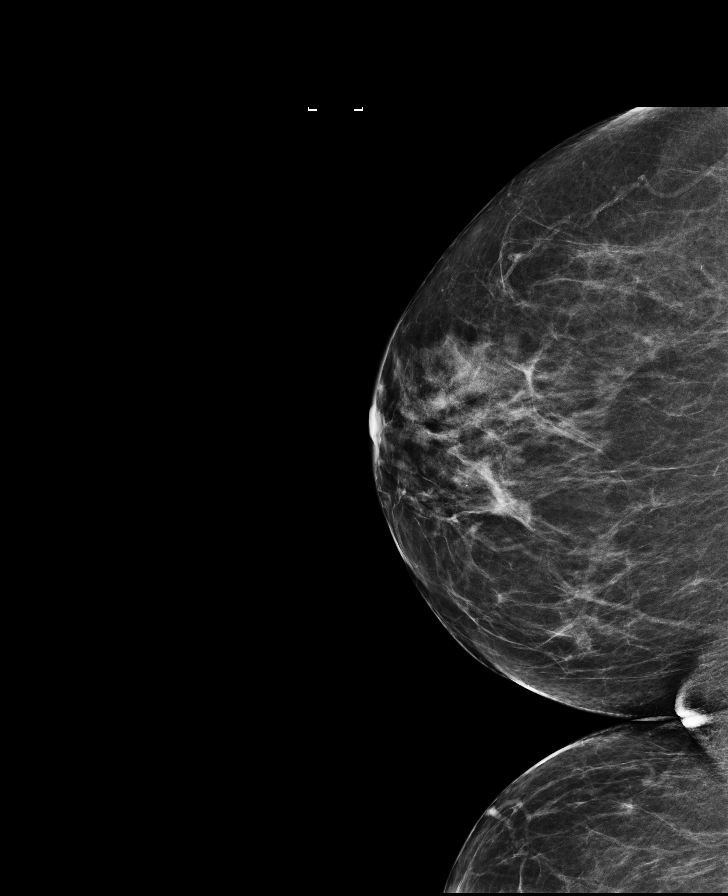

[8 of 28 positions shown; findings below may reference images not displayed]

ACR Breast Density Category b: There are scattered areas of
fibroglandular density.
FINDINGS: There are no findings suspicious for malignancy. Images were
processed with CAD.
IMPRESSION: No mammographic evidence of malignancy. A result letter of this
screening mammogram will be mailed directly to the patient.

RECOMMENDATION:
Screening mammogram in one year. (Code:[F0])

BI-RADS CATEGORY  1: Negative.

## 2015-09-05 ENCOUNTER — Other Ambulatory Visit: Payer: Self-pay | Admitting: Internal Medicine

## 2015-09-05 DIAGNOSIS — Z1231 Encounter for screening mammogram for malignant neoplasm of breast: Secondary | ICD-10-CM

## 2016-02-14 ENCOUNTER — Ambulatory Visit
Admission: RE | Admit: 2016-02-14 | Discharge: 2016-02-14 | Disposition: A | Discharge status: Home / Self Care | Payer: Medicare PPO | Source: Ambulatory Visit | Attending: Internal Medicine | Admitting: Internal Medicine

## 2016-02-14 DIAGNOSIS — Z1231 Encounter for screening mammogram for malignant neoplasm of breast: Secondary | ICD-10-CM | POA: Diagnosis present

## 2016-02-14 IMAGING — MG DIGITAL SCREENING BILATERAL MAMMOGRAM WITH CAD
4 series · 4 of 4 positions shown · non-contrast
Comparison: Previous exam(s).

CLINICAL DATA: Screening.

EXAM:
DIGITAL SCREENING BILATERAL MAMMOGRAM WITH CAD

[L CC]
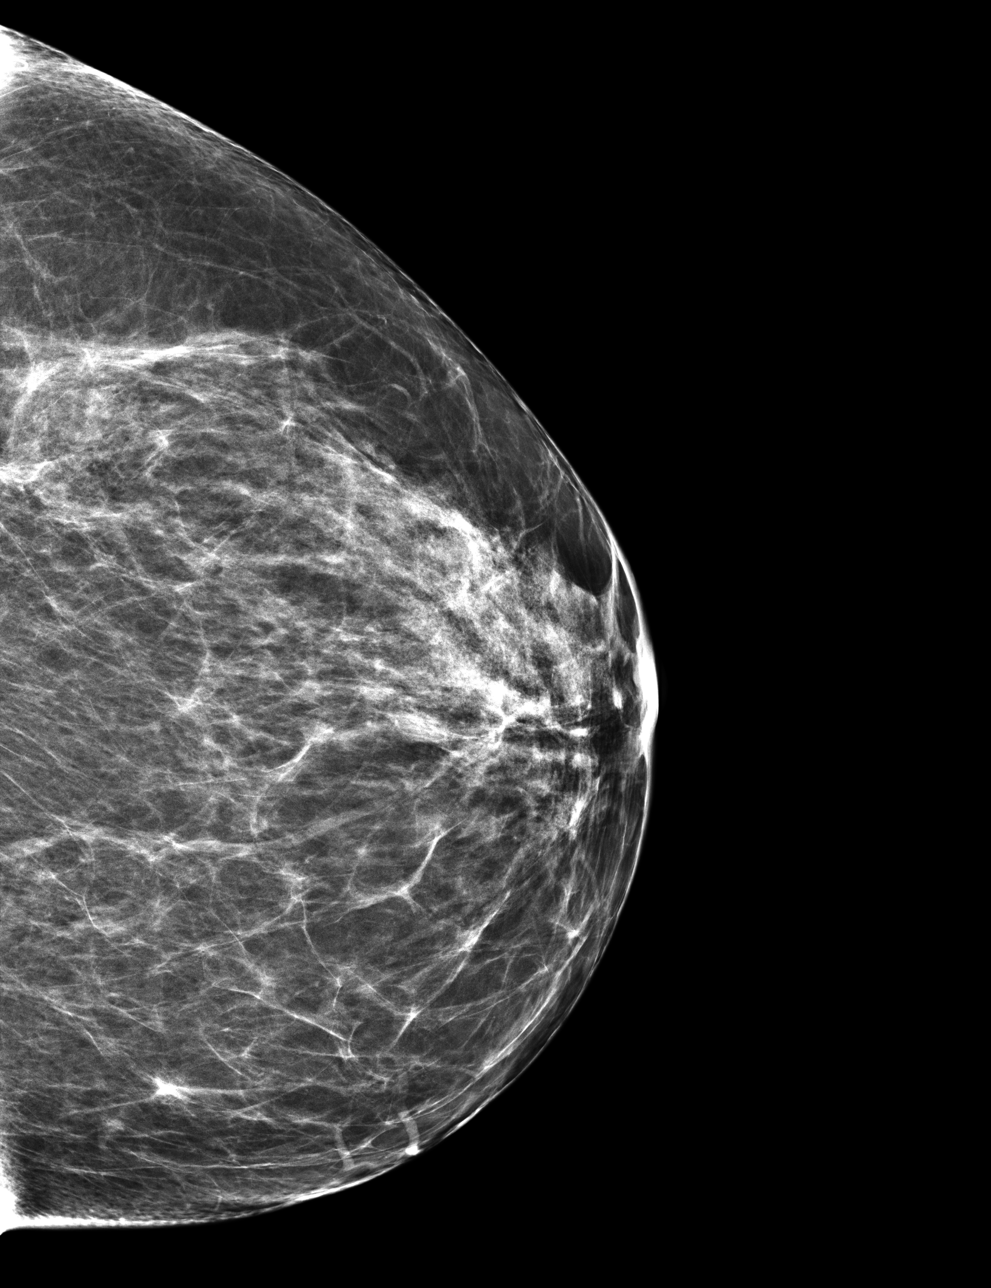

[L MLO]
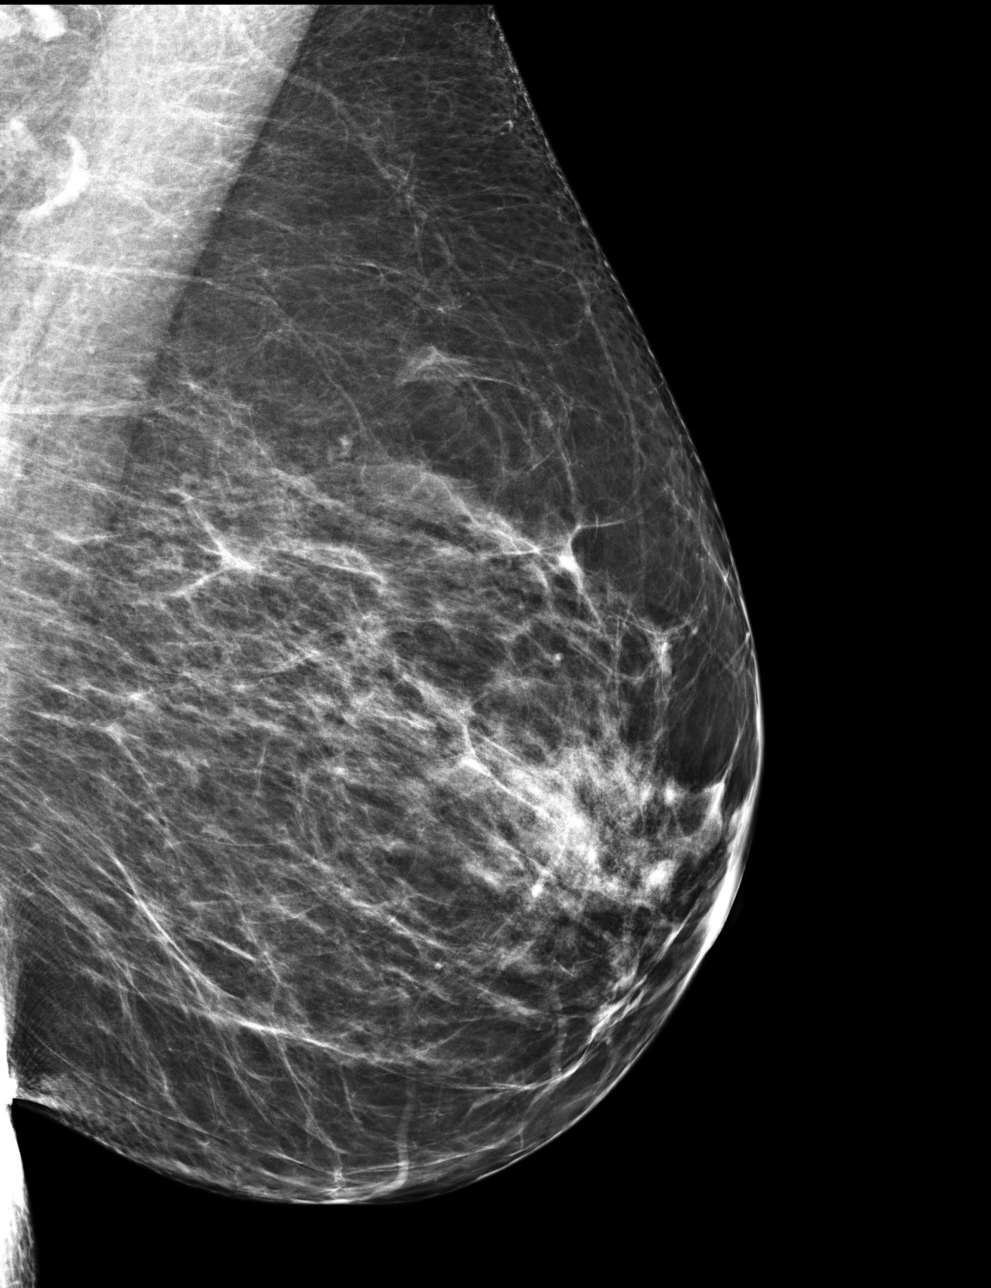

[R MLO]
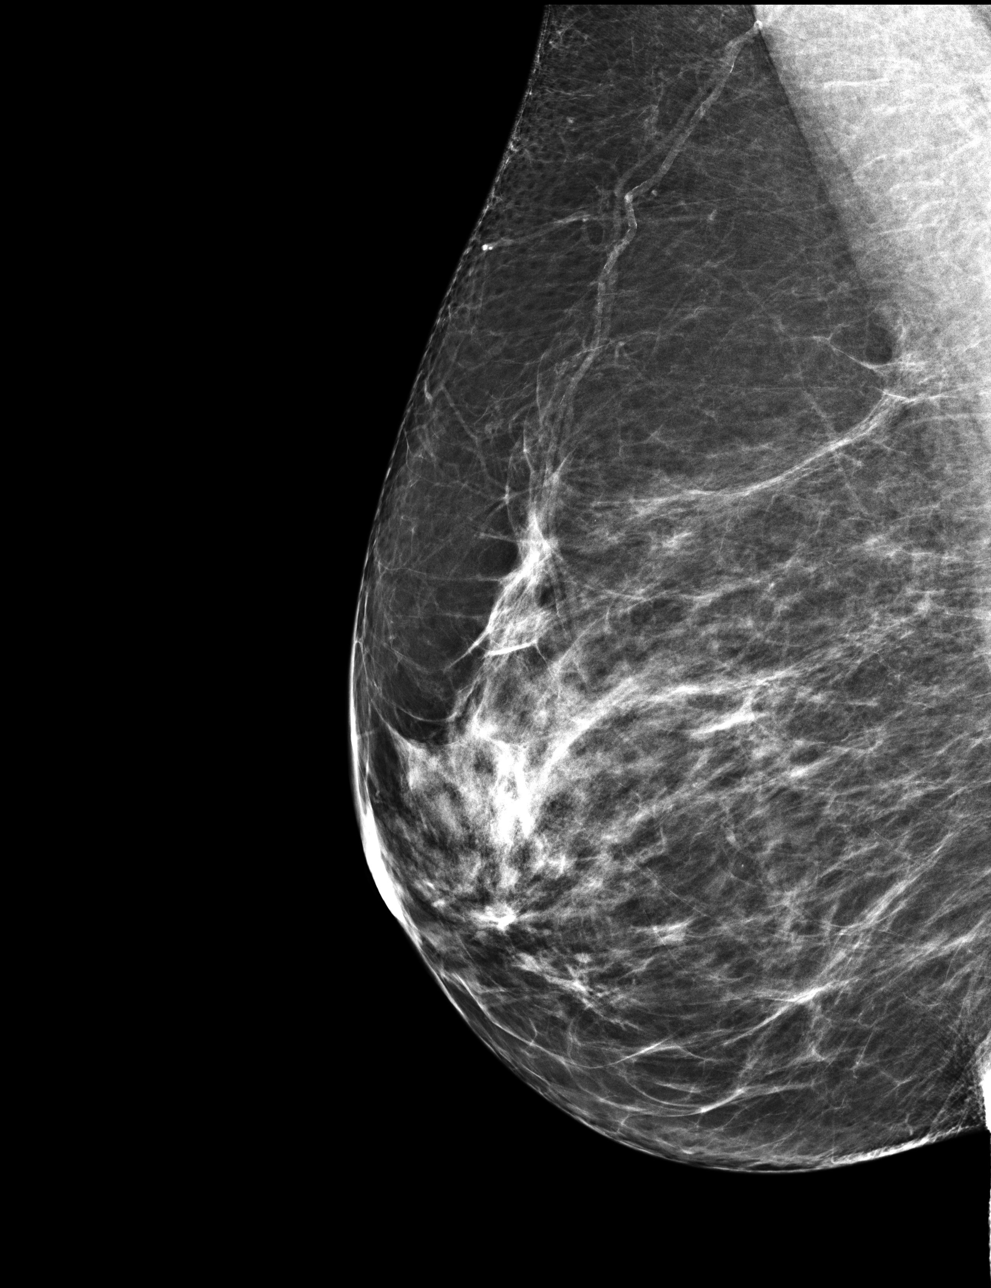

[R CC]
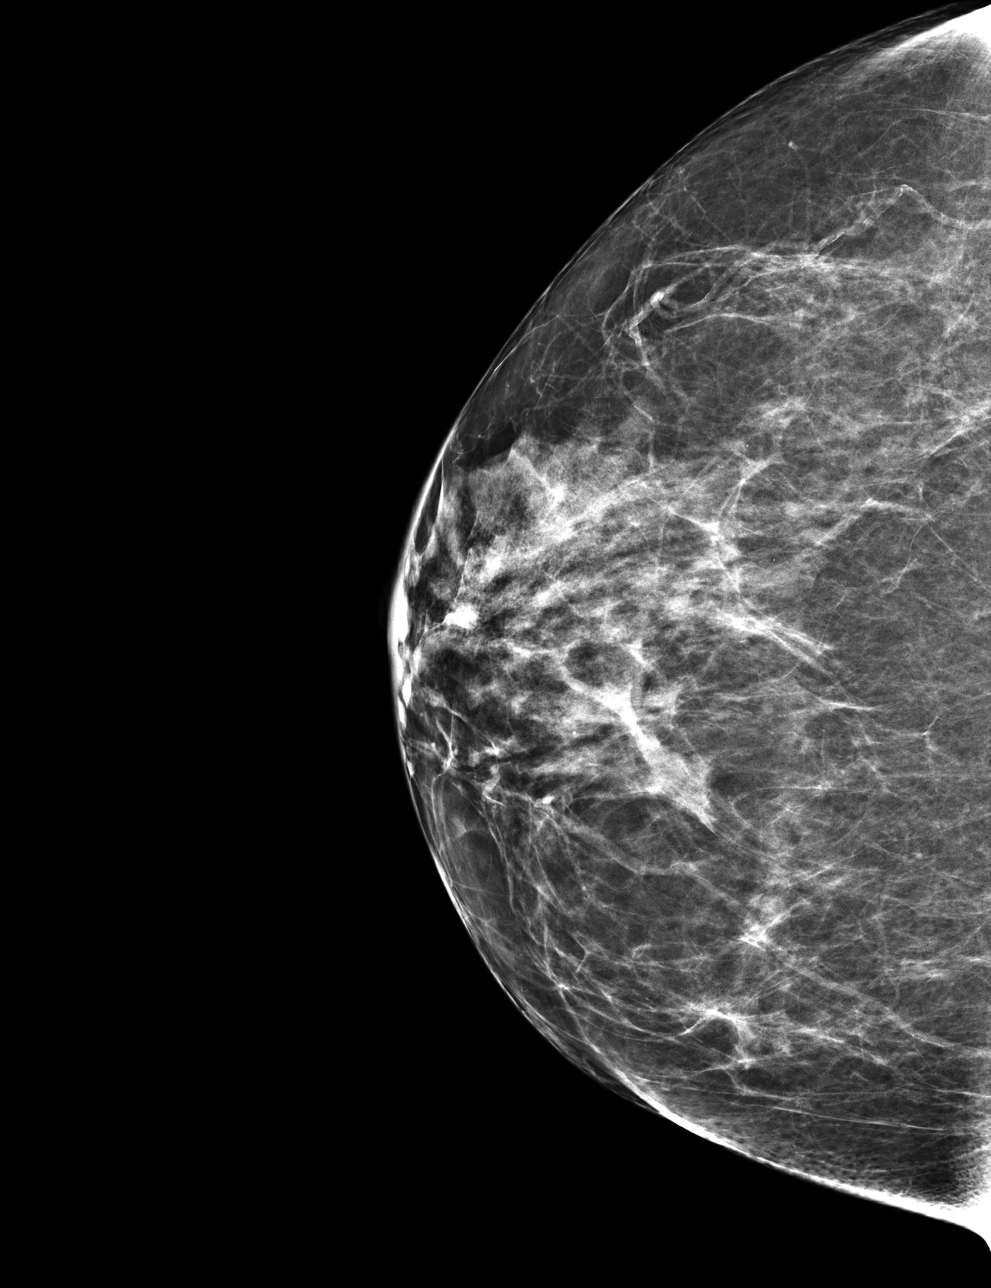

[4 of 4 positions shown; findings below may reference images not displayed]

ACR Breast Density Category c: The breast tissue is heterogeneously
dense, which may obscure small masses.
FINDINGS: There are no findings suspicious for malignancy. Images were
processed with CAD.
IMPRESSION: No mammographic evidence of malignancy. A result letter of this
screening mammogram will be mailed directly to the patient.

RECOMMENDATION:
Screening mammogram in one year. (Code:[0J])

BI-RADS CATEGORY  1: Negative.

## 2016-06-10 ENCOUNTER — Other Ambulatory Visit: Payer: Self-pay | Admitting: Internal Medicine

## 2016-06-10 DIAGNOSIS — Z1231 Encounter for screening mammogram for malignant neoplasm of breast: Secondary | ICD-10-CM

## 2016-10-23 HISTORY — PX: CATARACT EXTRACTION, BILATERAL: SHX1313

## 2017-02-17 ENCOUNTER — Ambulatory Visit
Admission: RE | Admit: 2017-02-17 | Discharge: 2017-02-17 | Disposition: A | Discharge status: Home / Self Care | Payer: Medicare PPO | Source: Ambulatory Visit | Attending: Internal Medicine | Admitting: Internal Medicine

## 2017-02-17 DIAGNOSIS — Z1231 Encounter for screening mammogram for malignant neoplasm of breast: Secondary | ICD-10-CM | POA: Insufficient documentation

## 2017-02-17 IMAGING — MG DIGITAL SCREENING BILATERAL MAMMOGRAM WITH CAD
5 series · 5 of 5 positions shown · non-contrast
Comparison: Previous exam(s).

CLINICAL DATA: Screening.

EXAM:
DIGITAL SCREENING BILATERAL MAMMOGRAM WITH CAD

[L CC]
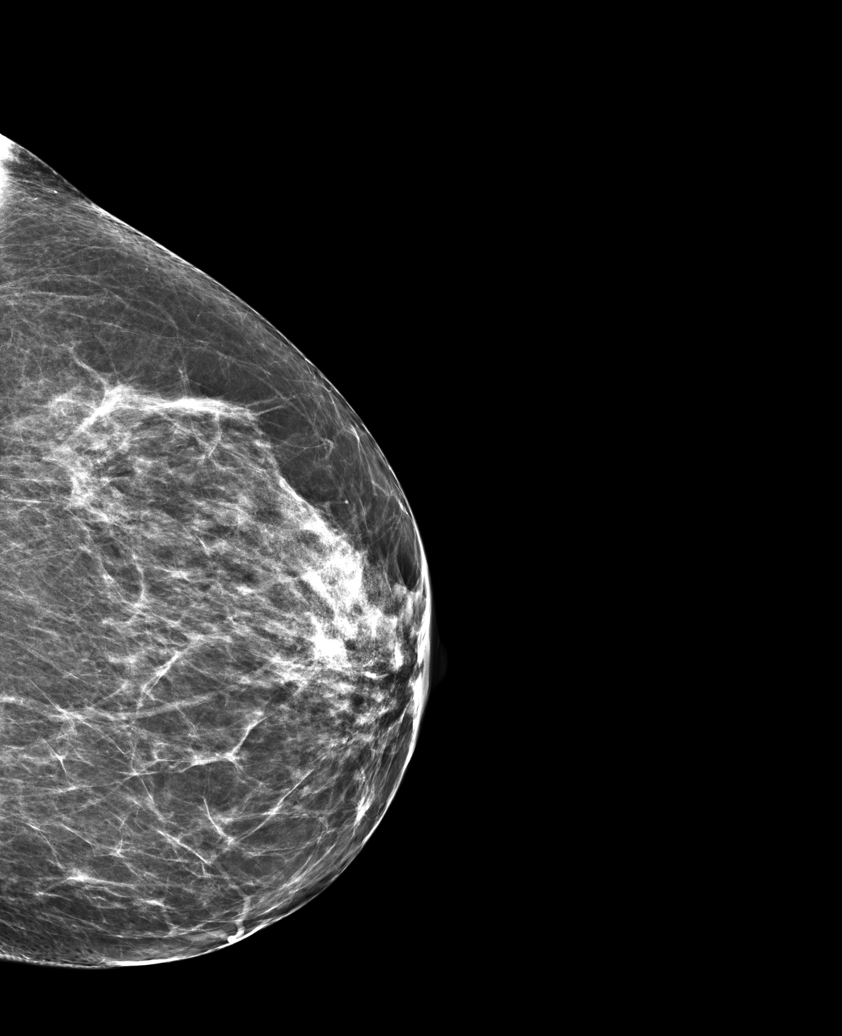

[R MLO]
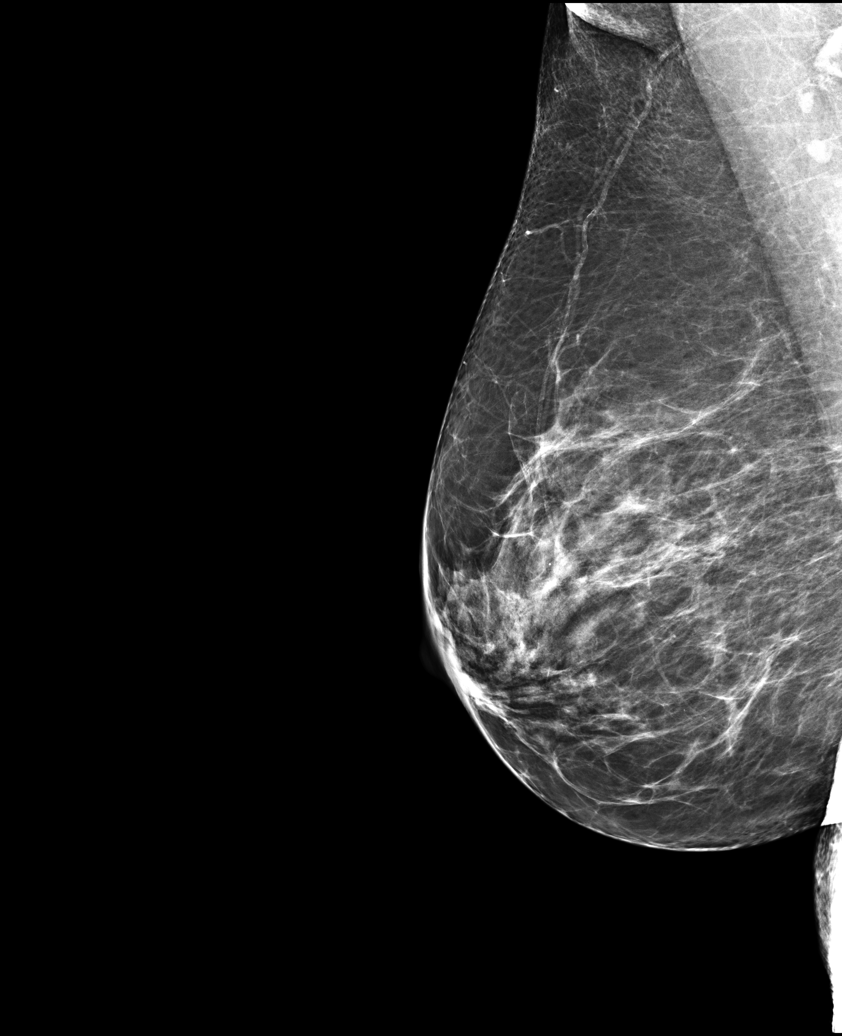

[R CC]
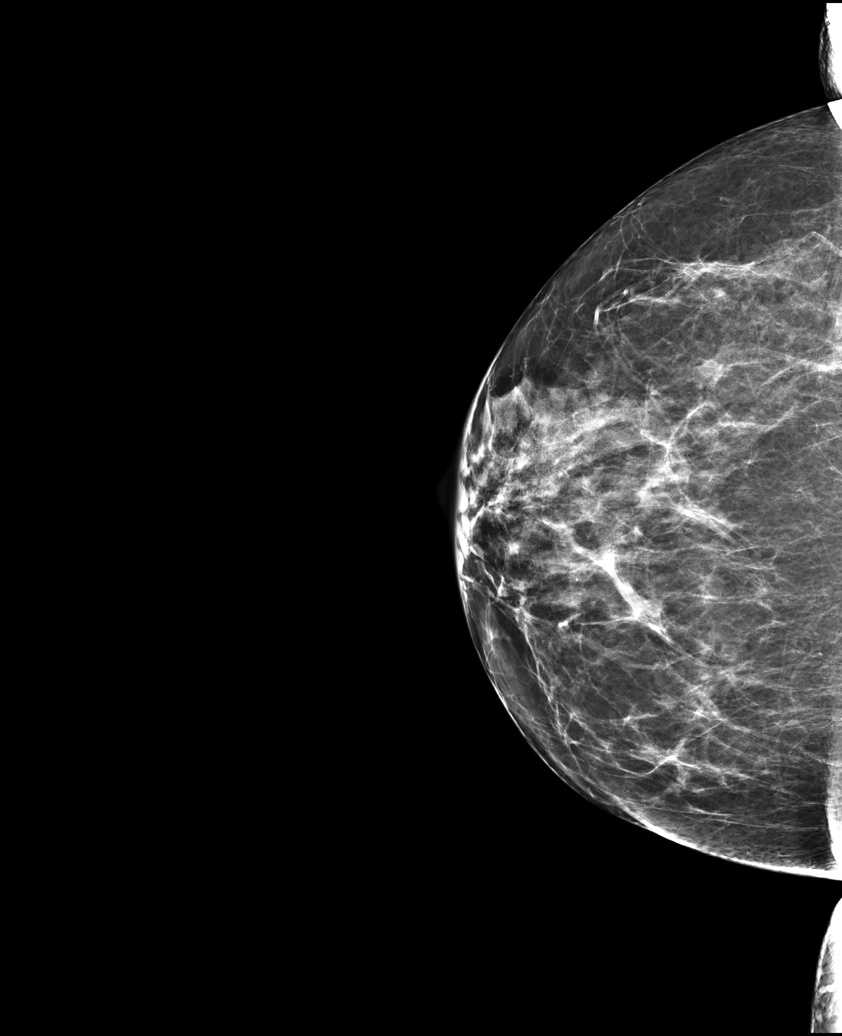

[R XCCL]
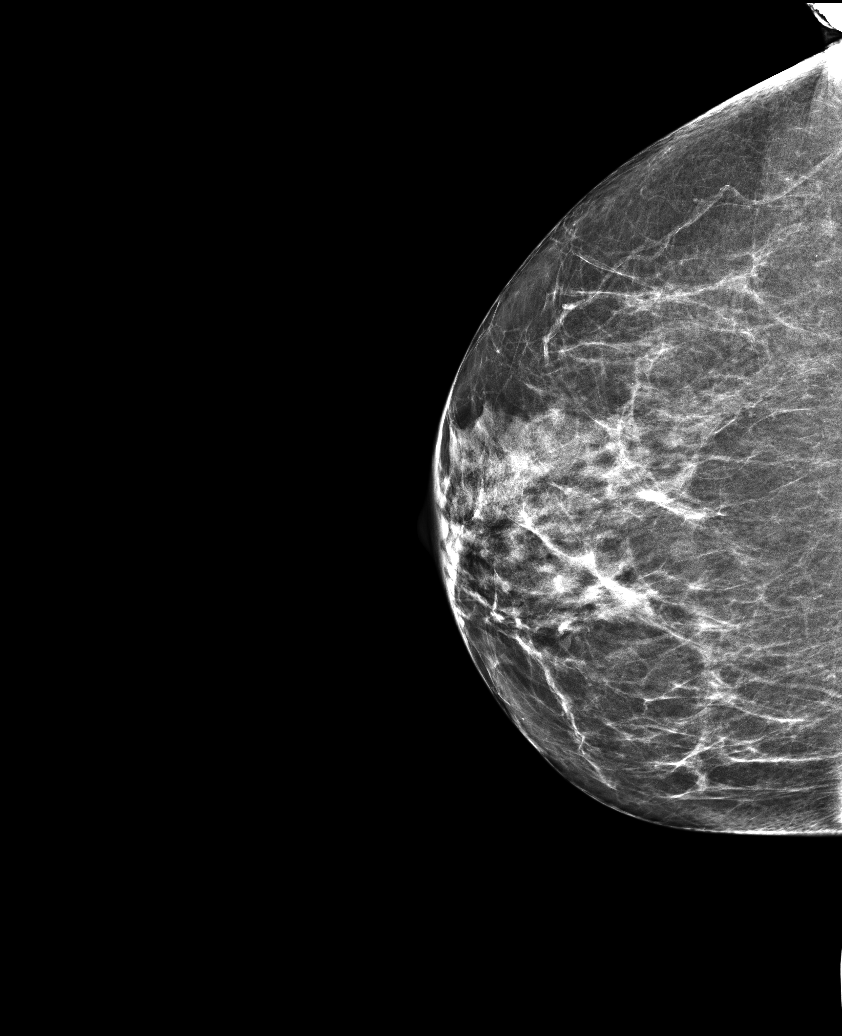

[L MLO]
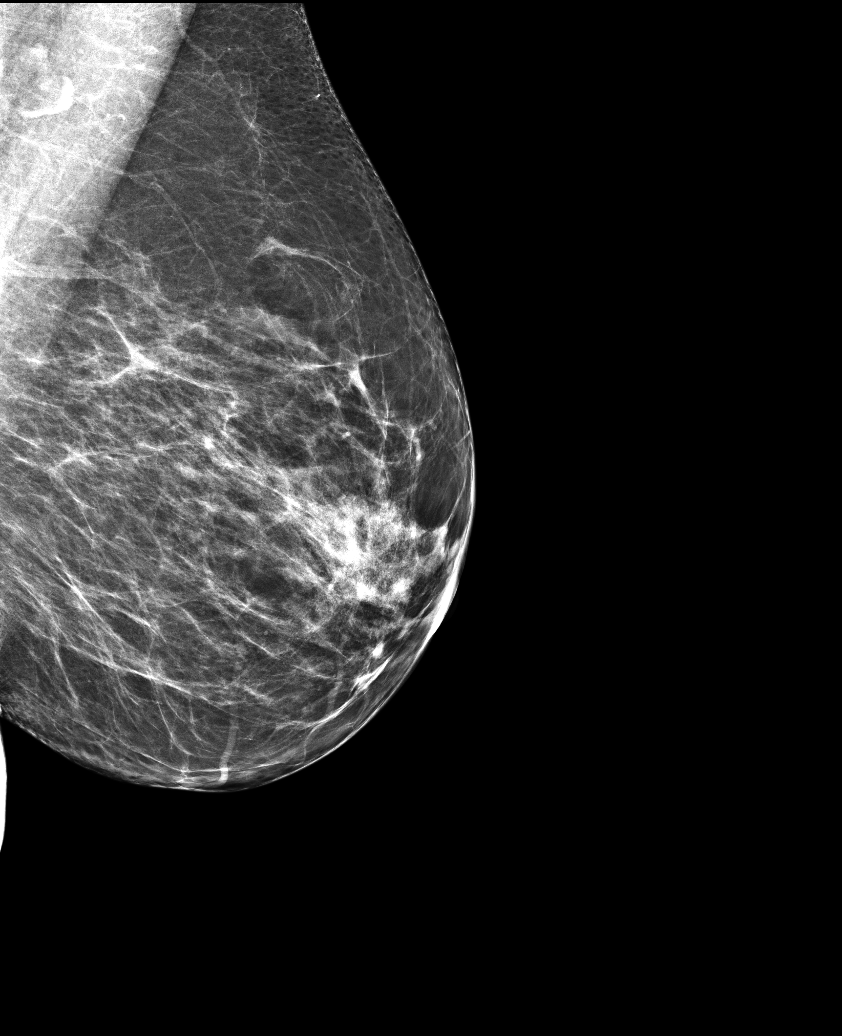

[5 of 5 positions shown; findings below may reference images not displayed]

ACR Breast Density Category c: The breast tissue is heterogeneously
dense, which may obscure small masses.
FINDINGS: There are no findings suspicious for malignancy. Images were
processed with CAD.
IMPRESSION: No mammographic evidence of malignancy. A result letter of this
screening mammogram will be mailed directly to the patient.

RECOMMENDATION:
Screening mammogram in one year. (Code:[0J])

BI-RADS CATEGORY  1: Negative.

## 2017-08-29 ENCOUNTER — Other Ambulatory Visit: Payer: Self-pay | Admitting: Internal Medicine

## 2017-08-29 DIAGNOSIS — Z1231 Encounter for screening mammogram for malignant neoplasm of breast: Secondary | ICD-10-CM

## 2018-02-18 ENCOUNTER — Ambulatory Visit
Admission: RE | Admit: 2018-02-18 | Discharge: 2018-02-18 | Disposition: A | Discharge status: Home / Self Care | Payer: Medicare PPO | Source: Ambulatory Visit | Attending: Internal Medicine | Admitting: Internal Medicine

## 2018-02-18 DIAGNOSIS — Z1231 Encounter for screening mammogram for malignant neoplasm of breast: Secondary | ICD-10-CM | POA: Diagnosis present

## 2018-02-18 IMAGING — MG DIGITAL SCREENING BILATERAL MAMMOGRAM WITH TOMO AND CAD
8 series · 8 of 24 positions shown · non-contrast
Comparison: Previous exam(s).

CLINICAL DATA: Screening.

EXAM:
DIGITAL SCREENING BILATERAL MAMMOGRAM WITH TOMO AND CAD

[L MLO synth-2D]
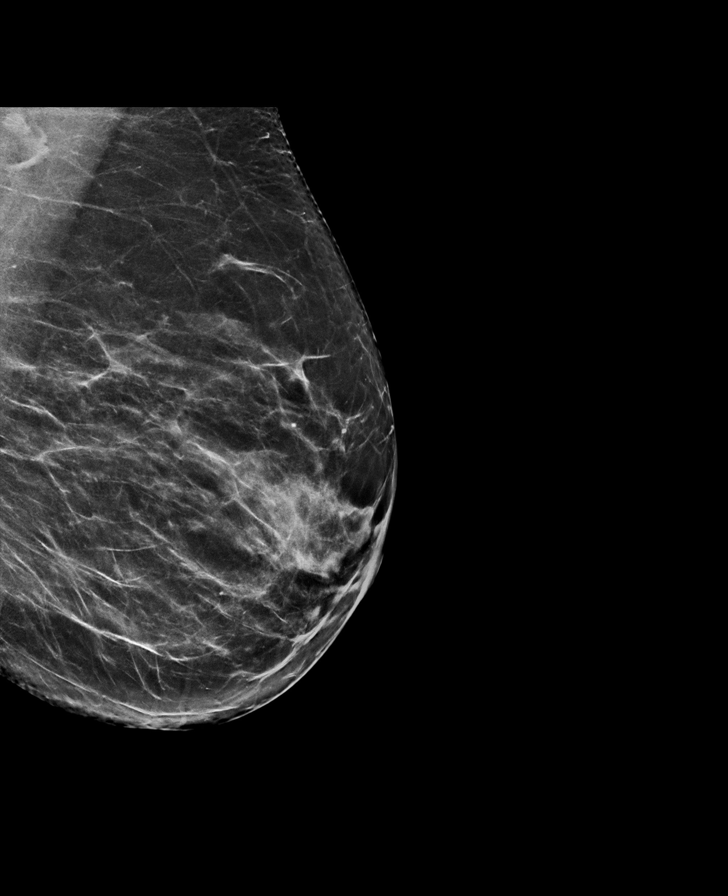

[L CC synth-2D]
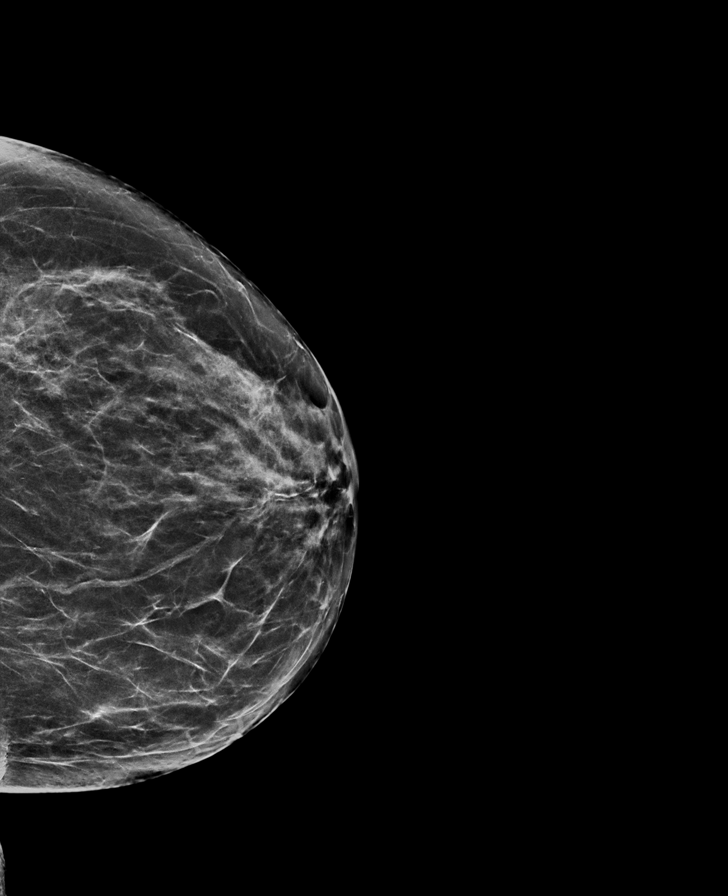

[R MLO synth-2D]
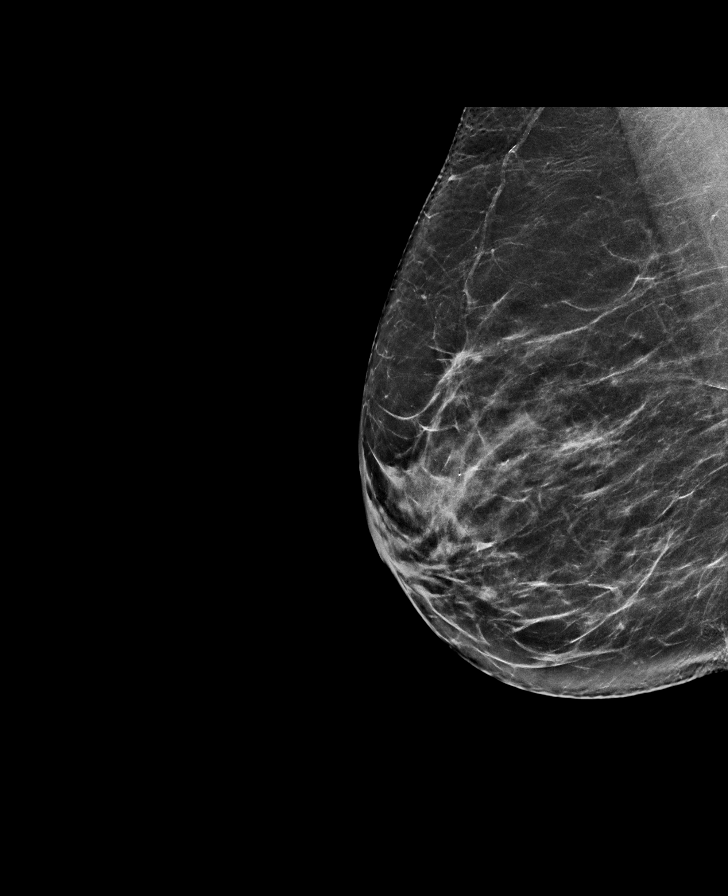

[R CC synth-2D]
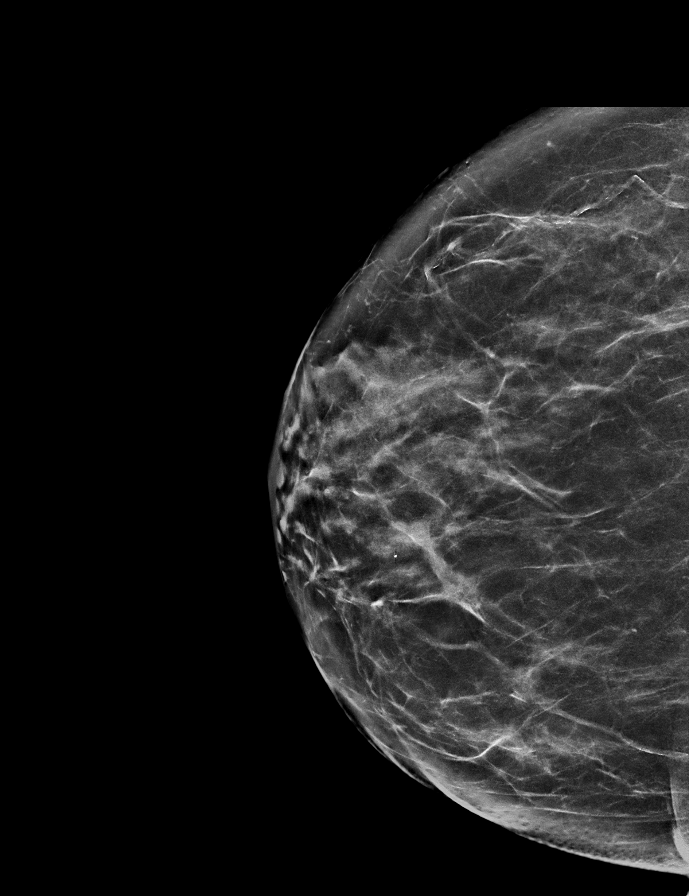

[L CC tomo · tomo slice 35/70.0]
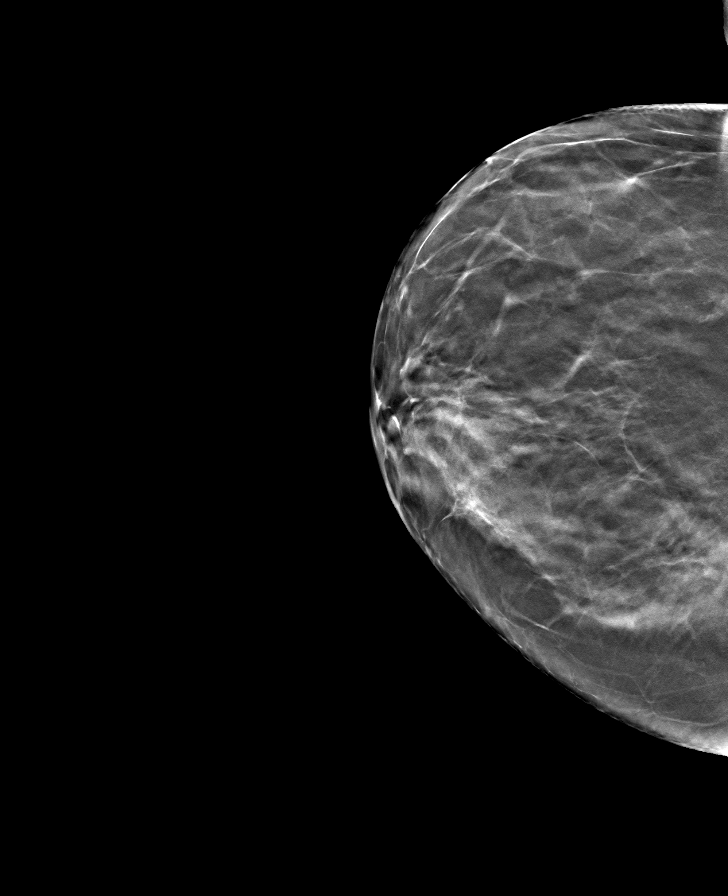

[R CC tomo · tomo slice 36/71.0]
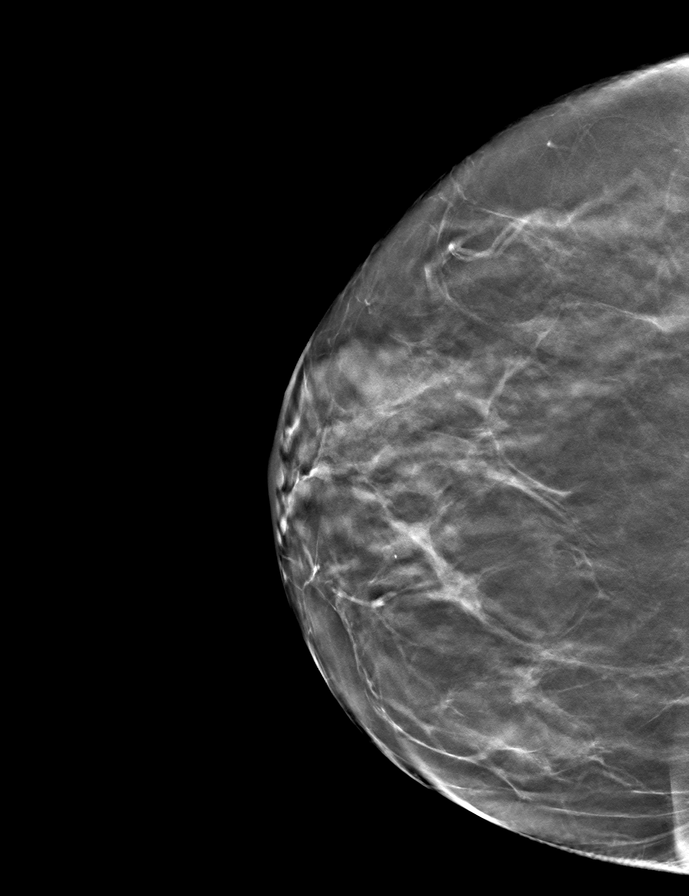

[L MLO tomo · tomo slice 40/79.0]
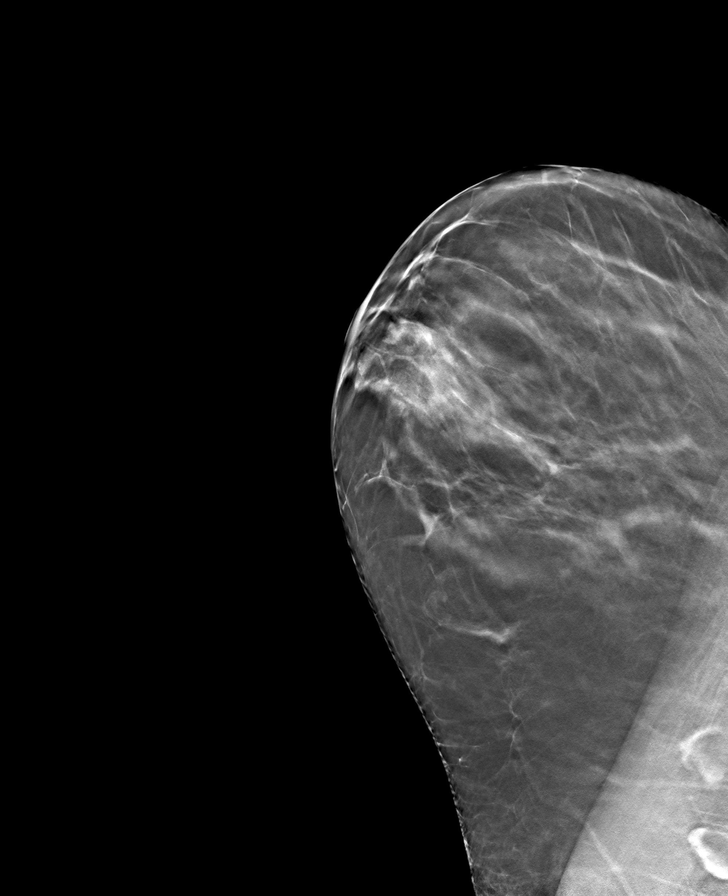

[R MLO tomo · tomo slice 37/73.0]
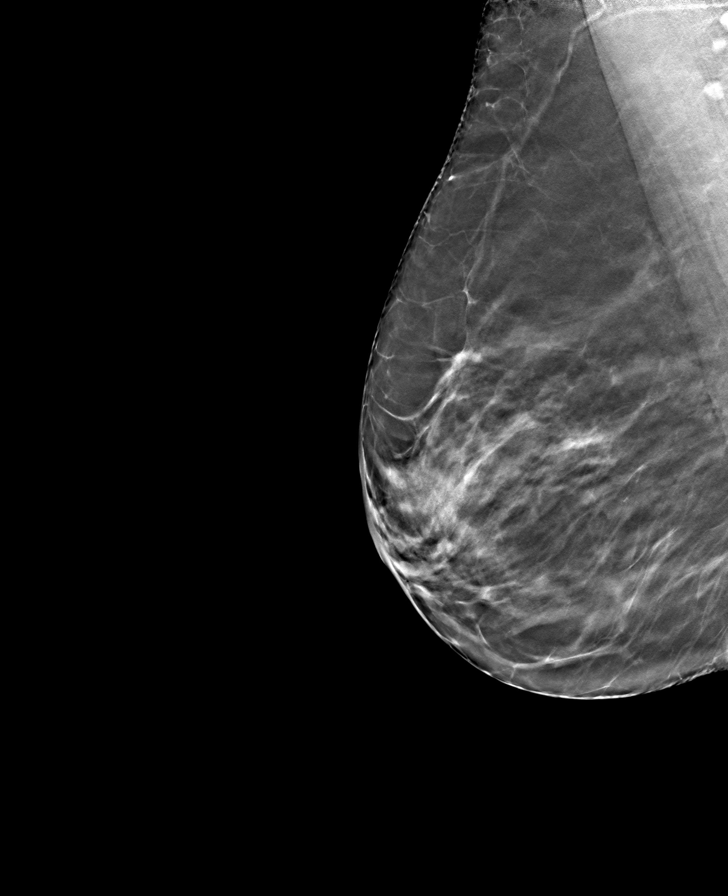

[8 of 24 positions shown; findings below may reference images not displayed]

ACR Breast Density Category c: The breast tissue is heterogeneously
dense, which may obscure small masses.
FINDINGS: There are no findings suspicious for malignancy. Images were
processed with CAD.
IMPRESSION: No mammographic evidence of malignancy. A result letter of this
screening mammogram will be mailed directly to the patient.

RECOMMENDATION:
Screening mammogram in one year. (Code:[5V])

BI-RADS CATEGORY  1: Negative.

## 2018-10-02 DIAGNOSIS — Z860101 Personal history of adenomatous and serrated colon polyps: Secondary | ICD-10-CM | POA: Insufficient documentation

## 2018-10-27 ENCOUNTER — Other Ambulatory Visit: Payer: Self-pay | Admitting: Internal Medicine

## 2018-10-27 DIAGNOSIS — Z1231 Encounter for screening mammogram for malignant neoplasm of breast: Secondary | ICD-10-CM

## 2018-12-04 ENCOUNTER — Other Ambulatory Visit
Admission: RE | Admit: 2018-12-04 | Discharge: 2018-12-04 | Disposition: A | Discharge status: Home / Self Care | Payer: Medicare PPO | Source: Ambulatory Visit | Attending: Internal Medicine | Admitting: Internal Medicine

## 2018-12-04 ENCOUNTER — Other Ambulatory Visit: Payer: Self-pay

## 2018-12-04 DIAGNOSIS — Z01812 Encounter for preprocedural laboratory examination: Secondary | ICD-10-CM | POA: Diagnosis present

## 2018-12-04 DIAGNOSIS — Z20828 Contact with and (suspected) exposure to other viral communicable diseases: Secondary | ICD-10-CM | POA: Diagnosis not present

## 2018-12-04 LAB — SARS CORONAVIRUS 2 (TAT 6-24 HRS): SARS Coronavirus 2: NEGATIVE

## 2018-12-07 ENCOUNTER — Emergency Department
Admission: EM | Admit: 2018-12-07 | Discharge: 2018-12-07 | Disposition: A | Discharge status: Home / Self Care | Payer: Medicare PPO | Attending: Emergency Medicine | Admitting: Emergency Medicine

## 2018-12-07 ENCOUNTER — Other Ambulatory Visit: Payer: Self-pay

## 2018-12-07 ENCOUNTER — Encounter: Payer: Self-pay | Admitting: *Deleted

## 2018-12-07 DIAGNOSIS — Y939 Activity, unspecified: Secondary | ICD-10-CM | POA: Diagnosis not present

## 2018-12-07 DIAGNOSIS — Y999 Unspecified external cause status: Secondary | ICD-10-CM | POA: Diagnosis not present

## 2018-12-07 DIAGNOSIS — Z23 Encounter for immunization: Secondary | ICD-10-CM | POA: Diagnosis not present

## 2018-12-07 DIAGNOSIS — Y929 Unspecified place or not applicable: Secondary | ICD-10-CM | POA: Diagnosis not present

## 2018-12-07 DIAGNOSIS — S81812A Laceration without foreign body, left lower leg, initial encounter: Secondary | ICD-10-CM

## 2018-12-07 DIAGNOSIS — E039 Hypothyroidism, unspecified: Secondary | ICD-10-CM | POA: Diagnosis not present

## 2018-12-07 DIAGNOSIS — S8992XA Unspecified injury of left lower leg, initial encounter: Secondary | ICD-10-CM | POA: Diagnosis present

## 2018-12-07 DIAGNOSIS — W268XXA Contact with other sharp object(s), not elsewhere classified, initial encounter: Secondary | ICD-10-CM | POA: Insufficient documentation

## 2018-12-07 DIAGNOSIS — Z79899 Other long term (current) drug therapy: Secondary | ICD-10-CM | POA: Insufficient documentation

## 2018-12-07 MED ORDER — TETANUS-DIPHTH-ACELL PERTUSSIS 5-2.5-18.5 LF-MCG/0.5 IM SUSP
0.5000 mL | Freq: Once | INTRAMUSCULAR | Status: AC
  Administered 2018-12-07: 0.5 mL via INTRAMUSCULAR
  Filled 2018-12-07: qty 0.5

## 2018-12-07 MED ORDER — CEPHALEXIN 500 MG PO CAPS: 1000.0000 mg | ORAL_CAPSULE | Freq: Two times a day (BID) | ORAL | 0 refills | Status: DC
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
## 2018-12-08 ENCOUNTER — Ambulatory Visit: Payer: Medicare PPO | Admitting: Anesthesiology

## 2018-12-08 ENCOUNTER — Encounter: Payer: Self-pay | Admitting: *Deleted

## 2018-12-08 ENCOUNTER — Ambulatory Visit
Admission: RE | Admit: 2018-12-08 | Discharge: 2018-12-08 | Disposition: A | Discharge status: Home / Self Care | Payer: Medicare PPO | Attending: Internal Medicine | Admitting: Internal Medicine

## 2018-12-08 ENCOUNTER — Encounter
Admission: RE | Disposition: A | Discharge status: Home / Self Care | Payer: Self-pay | Source: Home / Self Care | Attending: Internal Medicine

## 2018-12-08 DIAGNOSIS — Z79899 Other long term (current) drug therapy: Secondary | ICD-10-CM | POA: Diagnosis not present

## 2018-12-08 DIAGNOSIS — E039 Hypothyroidism, unspecified: Secondary | ICD-10-CM | POA: Insufficient documentation

## 2018-12-08 DIAGNOSIS — Z86010 Personal history of colonic polyps: Secondary | ICD-10-CM | POA: Insufficient documentation

## 2018-12-08 DIAGNOSIS — K644 Residual hemorrhoidal skin tags: Secondary | ICD-10-CM | POA: Diagnosis not present

## 2018-12-08 DIAGNOSIS — K640 First degree hemorrhoids: Secondary | ICD-10-CM | POA: Insufficient documentation

## 2018-12-08 DIAGNOSIS — Z885 Allergy status to narcotic agent status: Secondary | ICD-10-CM | POA: Insufficient documentation

## 2018-12-08 DIAGNOSIS — Z880 Allergy status to penicillin: Secondary | ICD-10-CM | POA: Insufficient documentation

## 2018-12-08 DIAGNOSIS — Z882 Allergy status to sulfonamides status: Secondary | ICD-10-CM | POA: Insufficient documentation

## 2018-12-08 DIAGNOSIS — Z1211 Encounter for screening for malignant neoplasm of colon: Secondary | ICD-10-CM | POA: Insufficient documentation

## 2018-12-08 DIAGNOSIS — I7300 Raynaud's syndrome without gangrene: Secondary | ICD-10-CM | POA: Insufficient documentation

## 2018-12-08 DIAGNOSIS — K621 Rectal polyp: Secondary | ICD-10-CM | POA: Insufficient documentation

## 2018-12-08 DIAGNOSIS — Z85828 Personal history of other malignant neoplasm of skin: Secondary | ICD-10-CM | POA: Diagnosis not present

## 2018-12-08 HISTORY — PX: COLONOSCOPY WITH PROPOFOL: SHX5780

## 2018-12-08 SURGERY — COLONOSCOPY WITH PROPOFOL
Anesthesia: General

## 2018-12-08 MED ORDER — PROPOFOL 500 MG/50ML IV EMUL
INTRAVENOUS | Status: DC | PRN
  Administered 2018-12-08: 150 ug/kg/min via INTRAVENOUS

## 2018-12-08 MED ORDER — PROPOFOL 10 MG/ML IV BOLUS
INTRAVENOUS | Status: DC | PRN
  Administered 2018-12-08: 70 mg via INTRAVENOUS

## 2018-12-08 MED ORDER — SODIUM CHLORIDE 0.9 % IV SOLN
INTRAVENOUS | Status: DC
  Administered 2018-12-08: 1000 mL via INTRAVENOUS

## 2018-12-08 MED ORDER — PROPOFOL 500 MG/50ML IV EMUL
INTRAVENOUS | Status: AC
  Filled 2018-12-08: qty 50

## 2018-12-08 NOTE — Anesthesia Post-op Follow-up Note (Signed)
Anesthesia QCDR form completed.        

## 2018-12-08 NOTE — Transfer of Care (Signed)
Immediate Anesthesia Transfer of Care Note  Patient: Reveille Guhl [facility]  Procedure(s) Performed: COLONOSCOPY WITH PROPOFOL (N/A )  Patient Location: PACU  Anesthesia Type:General  Level of Consciousness: awake and alert   Airway & Oxygen Therapy: Patient Spontanous Breathing and Patient connected to nasal cannula oxygen  Post-op Assessment: Report given to RN and Post -op Vital signs reviewed and stable  Post vital signs: Reviewed and stable  Last Vitals:  Vitals Value Taken Time  BP 111/59 12/08/18 1030  Temp    Pulse 77 12/08/18 1032  Resp 24 12/08/18 1032  SpO2 98 % 12/08/18 1032  Vitals shown include unvalidated device data.  Last Pain:  Vitals:   12/08/18 1030  TempSrc:   PainSc: 0-No pain         Complications: No apparent anesthesia complications

## 2018-12-08 NOTE — Anesthesia Preprocedure Evaluation (Signed)
Anesthesia Evaluation  Patient identified by MRN, date of birth, ID band Patient awake    Reviewed: Allergy & Precautions, H&P , NPO status , Patient's Chart, lab work & pertinent test results, reviewed documented beta blocker date and time   Airway Mallampati: II   Neck ROM: full    Dental  (+) Poor Dentition   Pulmonary neg pulmonary ROS,    Pulmonary exam normal        Cardiovascular Exercise Tolerance: Poor negative cardio ROS Normal cardiovascular exam Rhythm:regular Rate:Normal     Neuro/Psych negative neurological ROS  negative psych ROS   GI/Hepatic negative GI ROS, Neg liver ROS,   Endo/Other  negative endocrine ROSHypothyroidism   Renal/GU negative Renal ROS  negative genitourinary   Musculoskeletal   Abdominal   Peds  Hematology negative hematology ROS (+)   Anesthesia Other Findings Past Medical History: No date: Cancer ([place])     Comment:  skin No date: Hypothyroidism No date: Raynaud's disease Past Surgical History: 1991: ABDOMINAL HYSTERECTOMY 1991: APPENDECTOMY 2010: BACK SURGERY     Comment:  Cervical fusion C4-5-6-7 10/2016: CATARACT EXTRACTION, BILATERAL; Bilateral 1995: CHOLECYSTECTOMY No date: EYE SURGERY   Reproductive/Obstetrics negative OB ROS                             Anesthesia Physical Anesthesia Plan  ASA: III  Anesthesia Plan: General   Post-op Pain Management:    Induction:   PONV Risk Score and Plan:   Airway Management Planned:   Additional Equipment:   Intra-op Plan:   Post-operative Plan:   Informed Consent: I have reviewed the patients History and Physical, chart, labs and discussed the procedure including the risks, benefits and alternatives for the proposed anesthesia with the patient or authorized representative who has indicated his/her understanding and acceptance.     Dental Advisory Given  Plan Discussed with:  CRNA  Anesthesia Plan Comments:         Anesthesia Quick Evaluation

## 2018-12-08 NOTE — Anesthesia Postprocedure Evaluation (Signed)
Anesthesia Post Note  Patient: Jamia Abaya Fruchter  Procedure(s) Performed: COLONOSCOPY WITH PROPOFOL (N/A )  Patient location during evaluation: PACU Anesthesia Type: General Level of consciousness: awake and alert Pain management: pain level controlled Vital Signs Assessment: post-procedure vital signs reviewed and stable Respiratory status: spontaneous breathing, nonlabored ventilation, respiratory function stable and patient connected to nasal cannula oxygen Cardiovascular status: blood pressure returned to baseline and stable Postop Assessment: no apparent nausea or vomiting Anesthetic complications: no     Last Vitals:  Vitals:   12/08/18 1040 12/08/18 1050  BP: 114/62 120/75  Pulse: 73   Resp: 20 18  Temp:    SpO2: 96% 100%    Last Pain:  Vitals:   12/08/18 1050  TempSrc:   PainSc: 0-No pain                 Molli Barrows

## 2018-12-08 NOTE — Op Note (Signed)
[facility] Gastroenterology Patient Name: Teresa [name] Procedure Date: 12/08/2018 10:04 AM MRN: [id number] Account #: [id number] Date of Birth: 1940-11-19 Admit Type: Outpatient Age: 78 Room: [facility] ENDO ROOM 2 Gender: Female Note Status: Finalized Procedure:            Colonoscopy Indications:          Surveillance: Personal history of adenomatous polyps on                        last colonoscopy > 5 years ago Providers:            Lorie Apley K. Alice Reichert MD, MD Referring MD:         Leonie Douglas. Doy Hutching, MD (Referring MD) Medicines:            Propofol per Anesthesia Complications:        No immediate complications. Procedure:            Pre-Anesthesia Assessment:                       - The risks and benefits of the procedure and the                        sedation options and risks were discussed with the                        patient. All questions were answered and informed                        consent was obtained.                       - Patient identification and proposed procedure were                        verified prior to the procedure by the nurse. The                        procedure was verified in the procedure room.                       - ASA Grade Assessment: III - A patient with severe                        systemic disease.                       - After reviewing the risks and benefits, the patient                        was deemed in satisfactory condition to undergo the                        procedure.                       After obtaining informed consent, the colonoscope was                        passed under direct vision. Throughout the procedure,  the patient's blood pressure, pulse, and oxygen                        saturations were monitored continuously. The                        Colonoscope was introduced through the anus and                        advanced to the the cecum, identified by appendiceal                orifice and ileocecal valve. The colonoscopy was                        performed without difficulty. The patient tolerated the                        procedure well. The quality of the bowel preparation                        was good. The ileocecal valve, appendiceal orifice, and                        rectum were photographed. Findings:      Hemorrhoids were found on perianal exam.      Non-bleeding internal hemorrhoids were found during retroflexion. The       hemorrhoids were Grade I (internal hemorrhoids that do not prolapse).      Three sessile polyps were found in the rectum. The polyps were 2 to 3 mm       in size. These polyps were removed with a jumbo cold forceps. Resection       and retrieval were complete.      The exam was otherwise without abnormality. Impression:           - Hemorrhoids found on perianal exam.                       - Non-bleeding internal hemorrhoids.                       - Three 2 to 3 mm polyps in the rectum, removed with a                        jumbo cold forceps. Resected and retrieved.                       - The examination was otherwise normal. Recommendation:       - Patient has a contact number available for                        emergencies. The signs and symptoms of potential                        delayed complications were discussed with the patient.                        Return to normal activities tomorrow. Written discharge                        instructions  were provided to the patient.                       - Resume previous diet.                       - Continue present medications.                       - Await pathology results.                       - No repeat colonoscopy due to current age (66 years or                        older).                       - Return to GI office PRN. Procedure Code(s):    --- Professional ---                       [phone], Colonoscopy, flexible; with biopsy, single or                         multiple Diagnosis Code(s):    --- Professional ---                       K62.1, Rectal polyp                       K64.0, First degree hemorrhoids                       Z86.010, Personal history of colonic polyps CPT copyright 2019 [employer]. All rights reserved. The codes documented in this report are preliminary and upon coder review may  be revised to meet current compliance requirements. Efrain Sella MD, MD 12/08/2018 10:28:54 AM This report has been signed electronically. Number of Addenda: 0 Note Initiated On: 12/08/2018 10:04 AM Scope Withdrawal Time: 0 hours 8 minutes 44 seconds  Total Procedure Duration: 0 hours 13 minutes 36 seconds  Estimated Blood Loss: Estimated blood loss: none.      [facility]

## 2018-12-08 NOTE — Interval H&P Note (Signed)
History and Physical Interval Note:  12/08/2018 9:01 AM  Teresa [name]  has presented today for surgery, with the diagnosis of PERSONAL HX.OF COLON POLYPS.  The various methods of treatment have been discussed with the patient and family. After consideration of risks, benefits and other options for treatment, the patient has consented to  Procedure(s): COLONOSCOPY WITH PROPOFOL (N/A) as a surgical intervention.  The patient's history has been reviewed, patient examined, no change in status, stable for surgery.  I have reviewed the patient's chart and labs.  Questions were answered to the patient's satisfaction.     [place], [place]

## 2018-12-08 NOTE — H&P (Signed)
Outpatient short stay form Pre-procedure 12/08/2018 9:01 AM Teodoro K. Alice Reichert, M.D.  Primary Physician: [name], M.D.  Reason for visit:  Personal hx of adenomatous colon polyps.  History of present illness:                            Patient presents for colonoscopy for a personal hx of colon polyps. The patient denies abdominal pain, abnormal weight loss or rectal bleeding.    No current facility-administered medications for this encounter.   Medications Prior to Admission  Medication Sig Dispense Refill Last Dose  . gabapentin (NEURONTIN) 300 MG capsule TAKE 1 CAPSULE EVERY NIGHT   Past Week at Unknown time  . levothyroxine (SYNTHROID, LEVOTHROID) 75 MCG tablet TAKE 1 TABLET ONCE DAILY ON AN EMPTY STOMACH WITH A GLASS OF WATER AT LEAST 30 TO 60 MINUTES BEFORE BREAKFAST   12/07/2018 at Unknown time  . nisoldipine (SULAR) 34 MG 24 hr tablet TAKE 1 TABLET EVERY DAY   12/07/2018 at Unknown time  . cephALEXin (KEFLEX) 500 MG capsule Take 2 capsules (1,000 mg total) by mouth 2 (two) times daily. (Patient not taking: Reported on 12/08/2018) 28 capsule 0 Not Taking at Unknown time  . doxycycline (MONODOX) 100 MG capsule    Completed Course at Unknown time  . Ivermectin (SOOLANTRA) 1 % CREA apply ON THE SKIN daily   Completed Course at Unknown time     Allergies  Allergen Reactions  . Codeine   . Penicillins   . Sulfa Antibiotics      Past Medical History:  Diagnosis Date  . Cancer ([place])    skin  . Hypothyroidism   . Raynaud's disease     Review of systems:  Otherwise negative.    Physical Exam  Gen: Alert, oriented. Appears stated age.  HEENT: [place]/AT. PERRLA. Lungs: CTA, no wheezes. CV: RR nl S1, S2. Abd: soft, benign, no masses. BS+ Ext: No edema. Pulses 2+    Planned procedures: Proceed with colonoscopy. The patient understands the nature of the planned procedure, indications, risks, alternatives and potential complications including but not limited to bleeding,  infection, perforation, damage to internal organs and possible oversedation/side effects from anesthesia. The patient agrees and gives consent to proceed.  Please refer to procedure notes for findings, recommendations and patient disposition/instructions.     Teodoro K. Alice Reichert, M.D. Gastroenterology 12/08/2018  9:01 AM

## 2018-12-09 ENCOUNTER — Encounter: Payer: Self-pay | Admitting: Internal Medicine

## 2018-12-09 LAB — SURGICAL PATHOLOGY

## 2019-02-24 ENCOUNTER — Ambulatory Visit
Admission: RE | Admit: 2019-02-24 | Discharge: 2019-02-24 | Disposition: A | Discharge status: Home / Self Care | Payer: Medicare PPO | Source: Ambulatory Visit | Attending: Internal Medicine | Admitting: Internal Medicine

## 2019-02-24 DIAGNOSIS — Z1231 Encounter for screening mammogram for malignant neoplasm of breast: Secondary | ICD-10-CM | POA: Diagnosis not present

## 2019-02-24 IMAGING — MG DIGITAL SCREENING BILAT W/ TOMO W/ CAD
8 series · 8 of 24 positions shown · non-contrast
Comparison: Previous exam(s).

CLINICAL DATA: Screening.

EXAM:
DIGITAL SCREENING BILATERAL MAMMOGRAM WITH TOMO AND CAD

[L CC synth-2D]
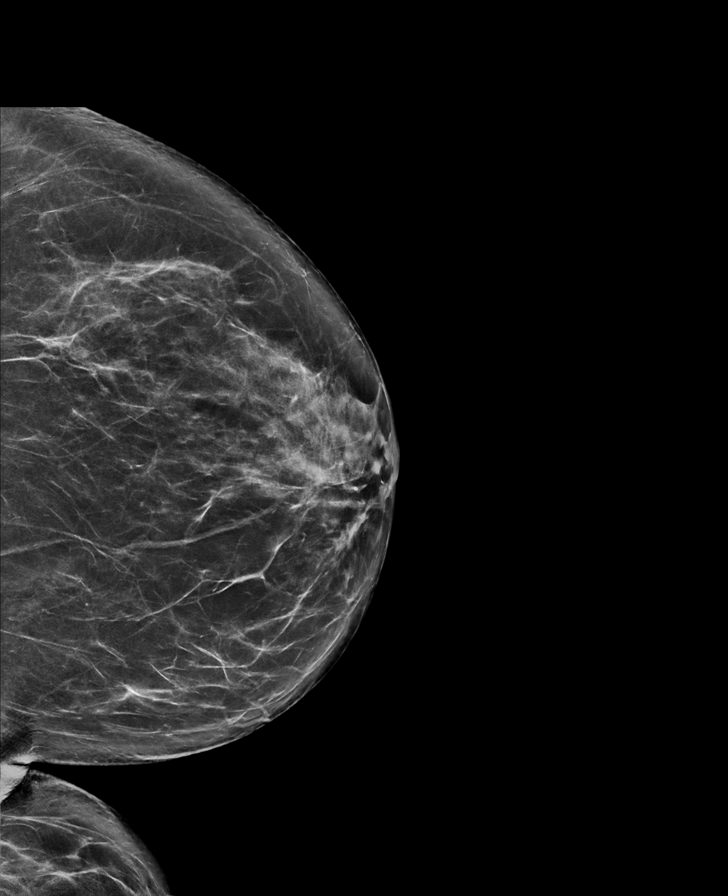

[R MLO synth-2D]
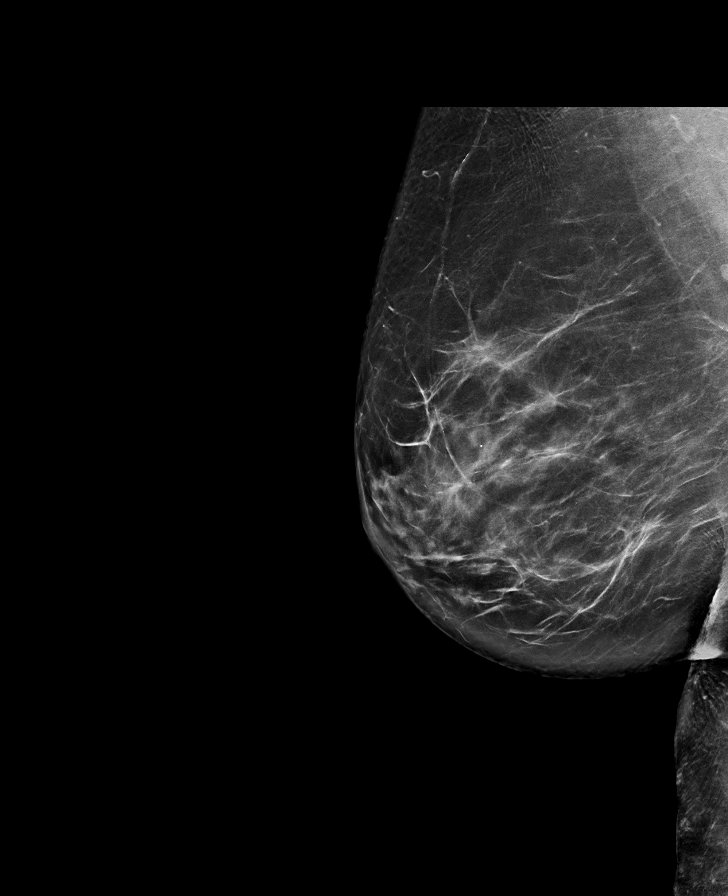

[R CC synth-2D]
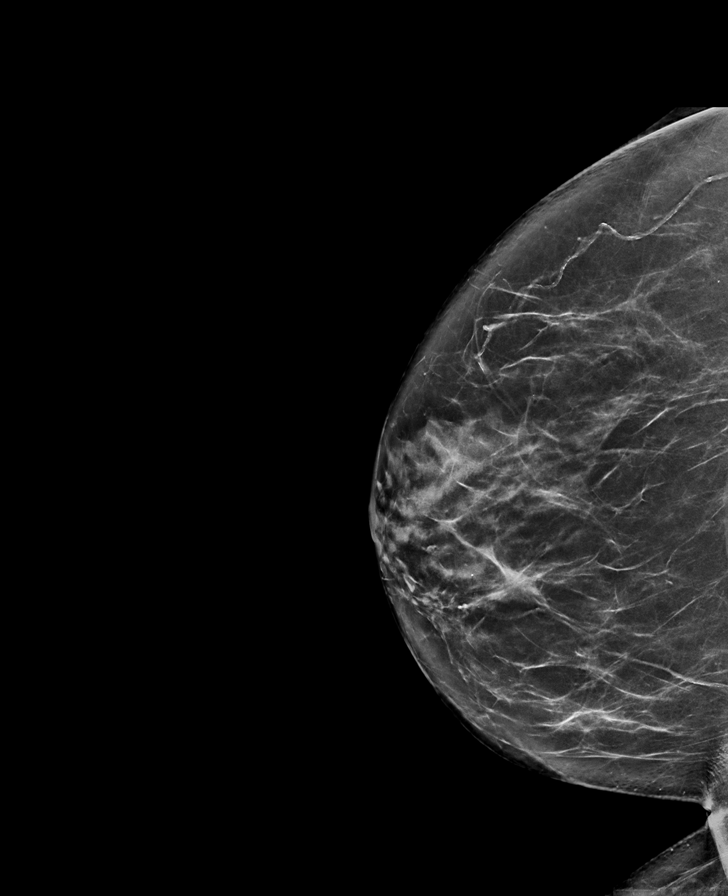

[L MLO synth-2D]
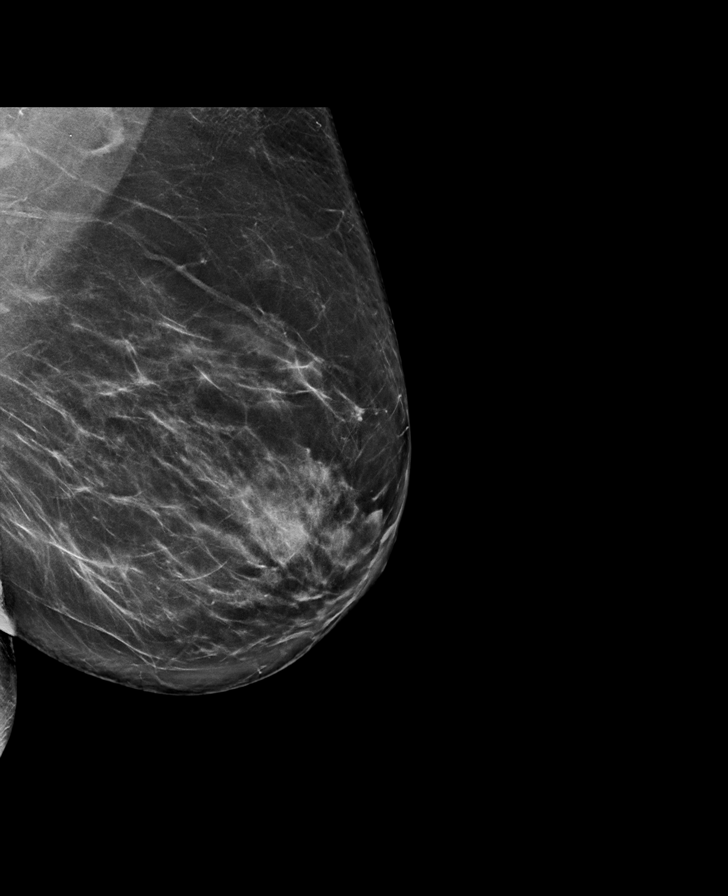

[L CC tomo · tomo slice 39/77.0]
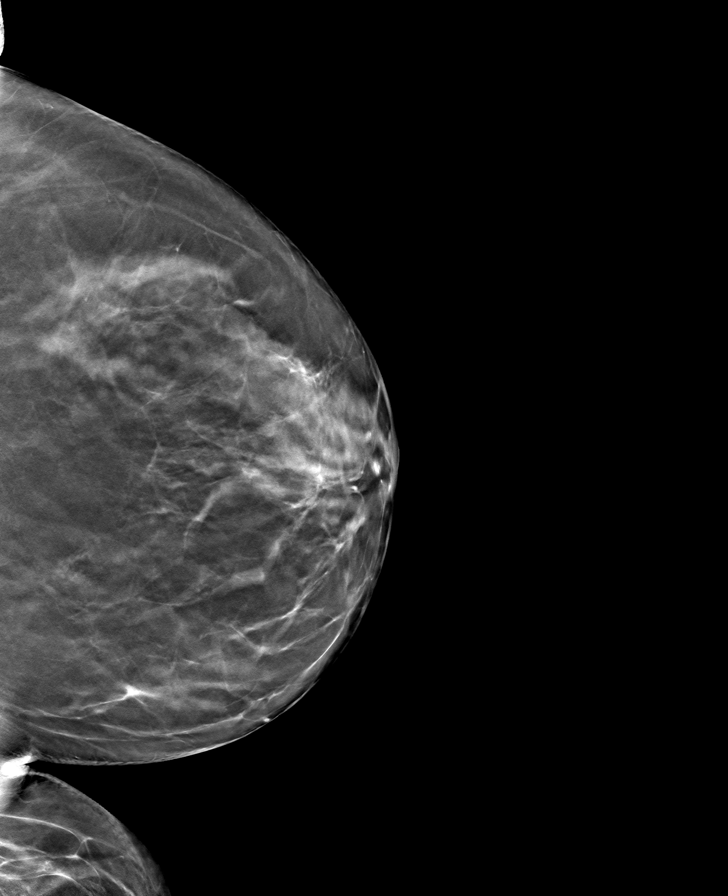

[R CC tomo · tomo slice 39/78.0]
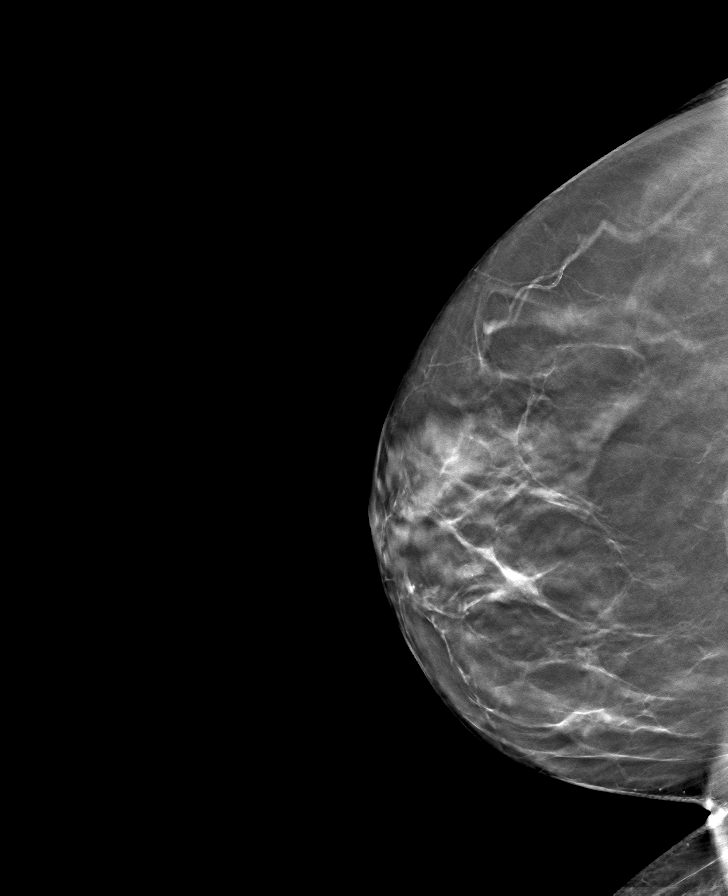

[L MLO tomo · tomo slice 44/87.0]
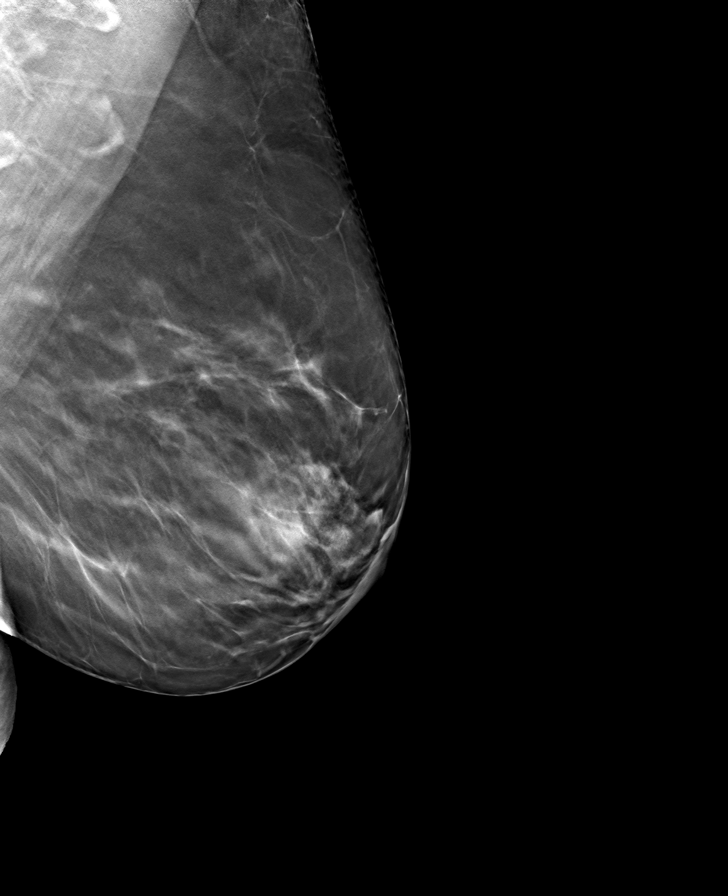

[R MLO tomo · tomo slice 44/87.0]
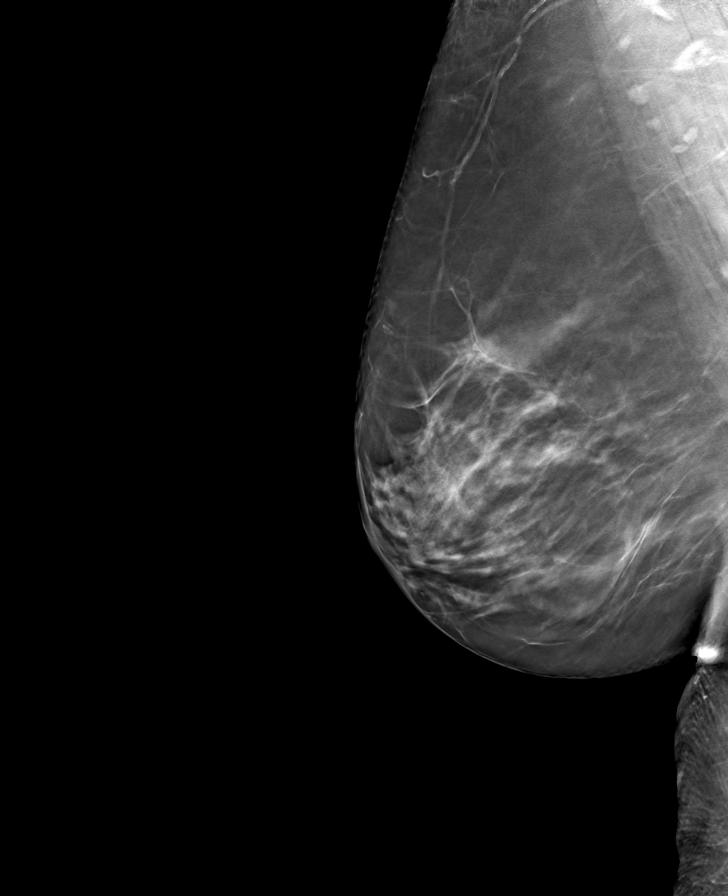

[8 of 24 positions shown; findings below may reference images not displayed]

ACR Breast Density Category b: There are scattered areas of
fibroglandular density.
FINDINGS: There are no findings suspicious for malignancy. Images were
processed with CAD.
IMPRESSION: No mammographic evidence of malignancy. A result letter of this
screening mammogram will be mailed directly to the patient.

RECOMMENDATION:
Screening mammogram in one year. (Code:[TQ])

BI-RADS CATEGORY  1: Negative.

## 2019-05-10 ENCOUNTER — Other Ambulatory Visit: Payer: Self-pay | Admitting: Internal Medicine

## 2019-05-10 DIAGNOSIS — N189 Chronic kidney disease, unspecified: Secondary | ICD-10-CM

## 2019-05-10 DIAGNOSIS — I10 Essential (primary) hypertension: Secondary | ICD-10-CM

## 2019-05-17 ENCOUNTER — Other Ambulatory Visit: Payer: Self-pay

## 2019-05-17 ENCOUNTER — Ambulatory Visit
Admission: RE | Admit: 2019-05-17 | Discharge: 2019-05-17 | Disposition: A | Discharge status: Home / Self Care | Payer: Medicare PPO | Source: Ambulatory Visit | Attending: Internal Medicine | Admitting: Internal Medicine

## 2019-05-17 DIAGNOSIS — N189 Chronic kidney disease, unspecified: Secondary | ICD-10-CM | POA: Diagnosis present

## 2019-05-17 DIAGNOSIS — I10 Essential (primary) hypertension: Secondary | ICD-10-CM

## 2019-05-17 IMAGING — US US RENAL
1 series · 14 of 25 positions shown · non-contrast
Comparison: None available.

CLINICAL DATA: Initial evaluation for chronic kidney disease.

EXAM:
RENAL / URINARY TRACT ULTRASOUND COMPLETE

[Series 1: us renal · 0.25mm/px · 14 of 37 slices shown]
[im 1/37]
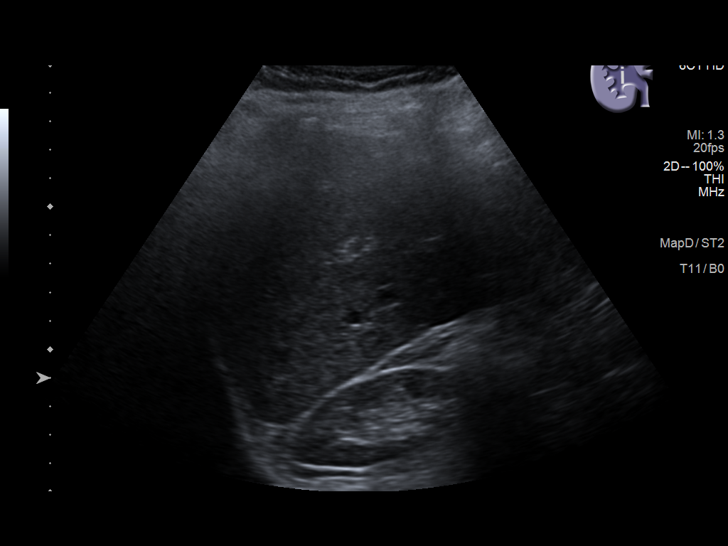
[im 4/37]
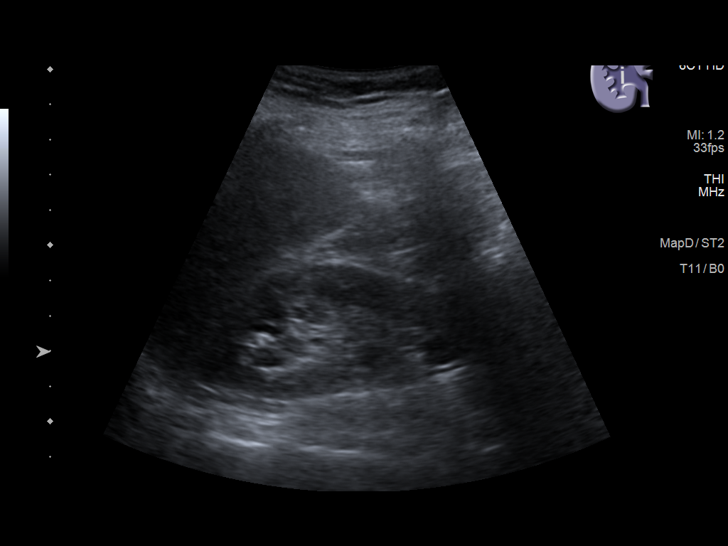
[im 7/37]
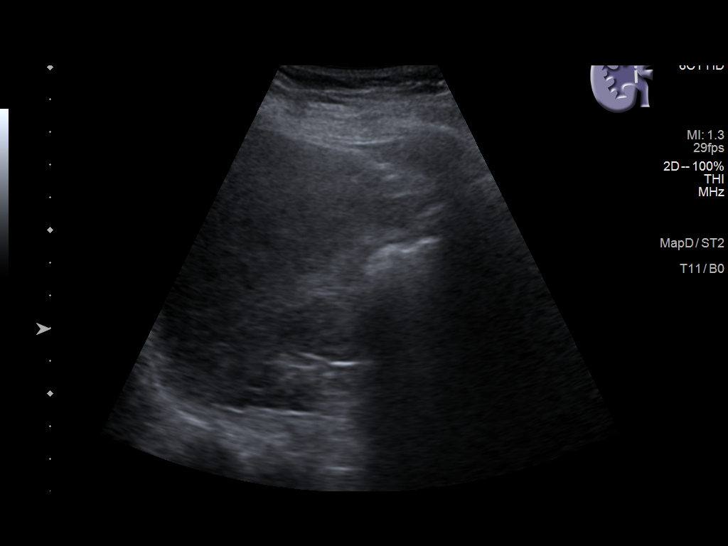
[im 10/37]
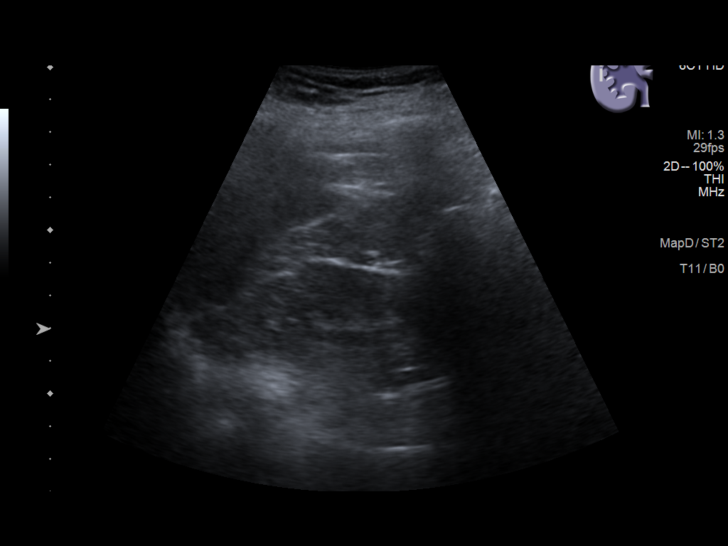
[im 13/37]
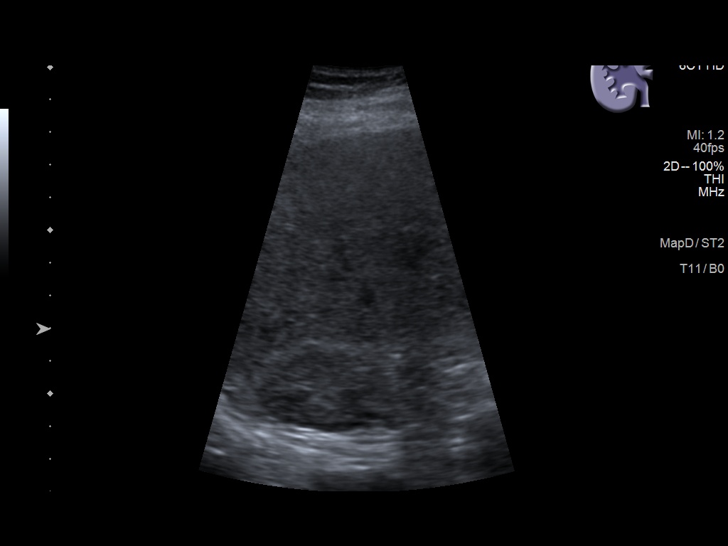
[im 14/37]
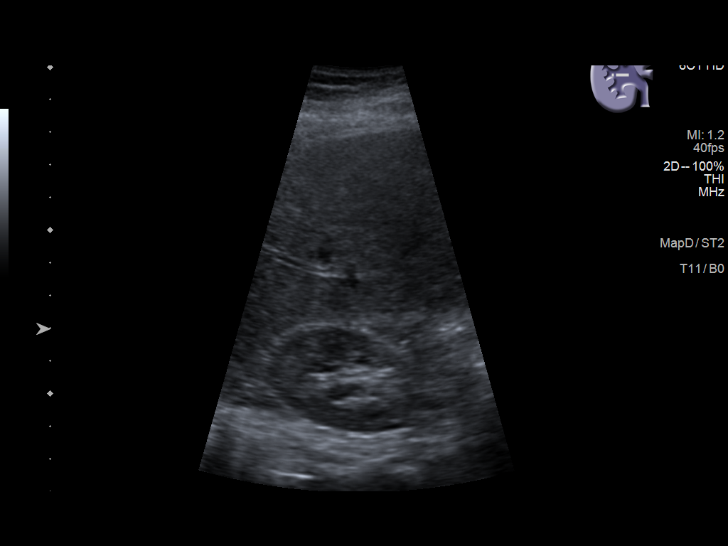
[im 17/37]
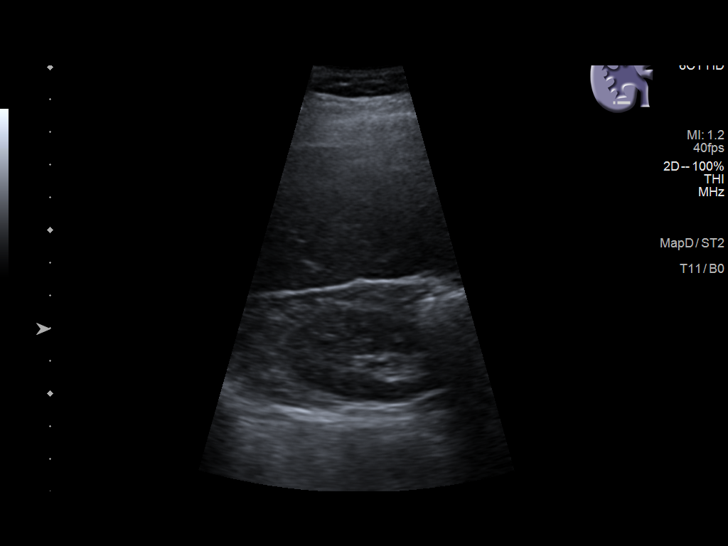
[im 20/37]
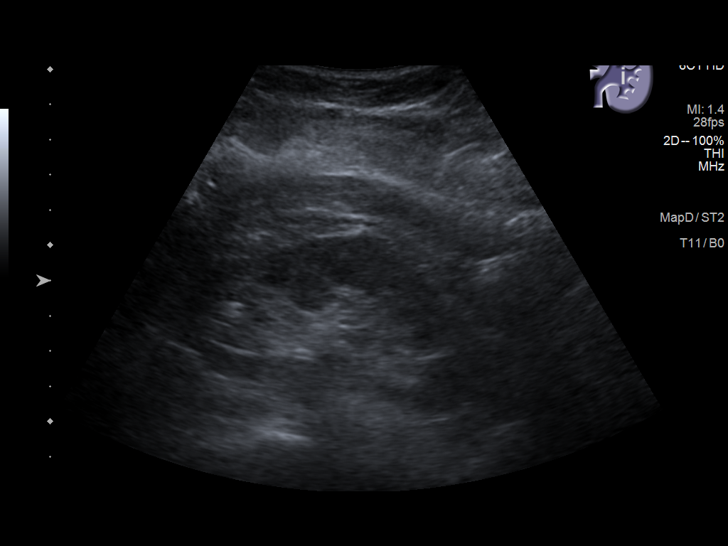
[im 23/37]
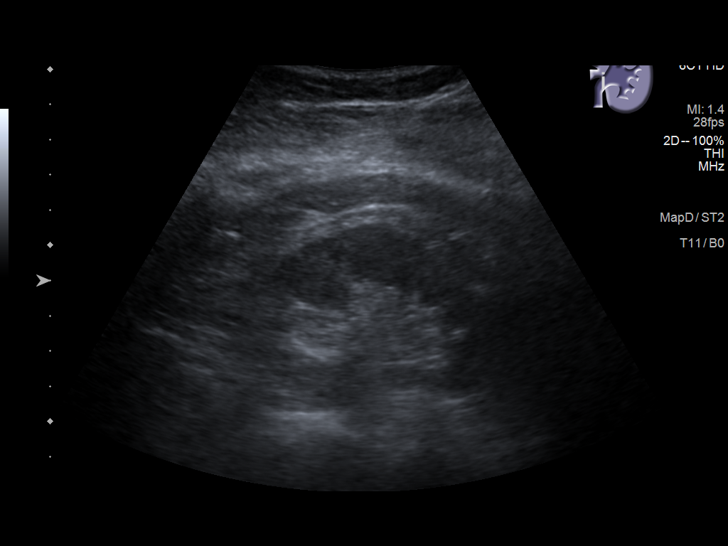
[im 25/37]
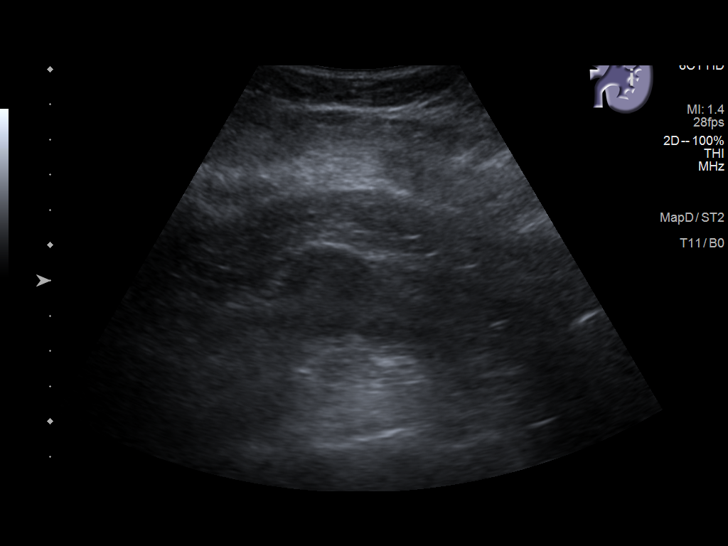
[im 28/37]
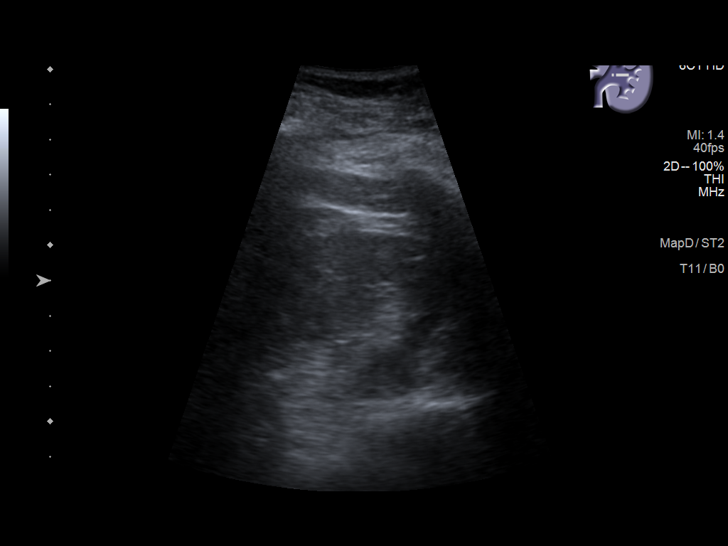
[im 31/37]
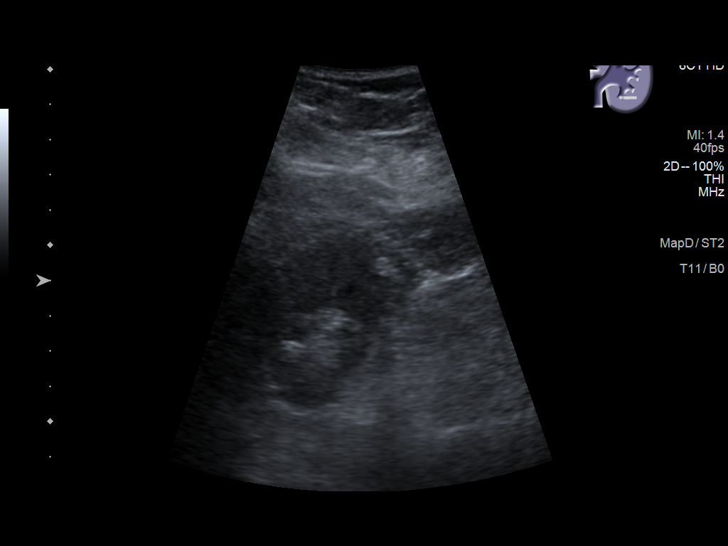
[im 34/37]
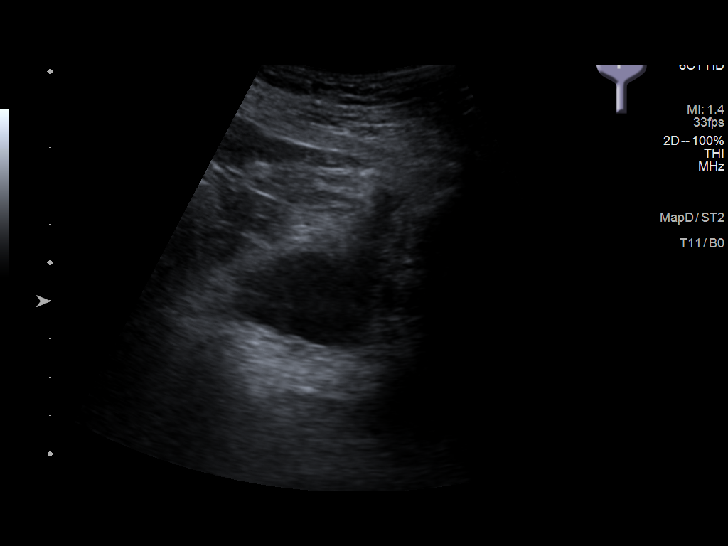
[im 37/37]
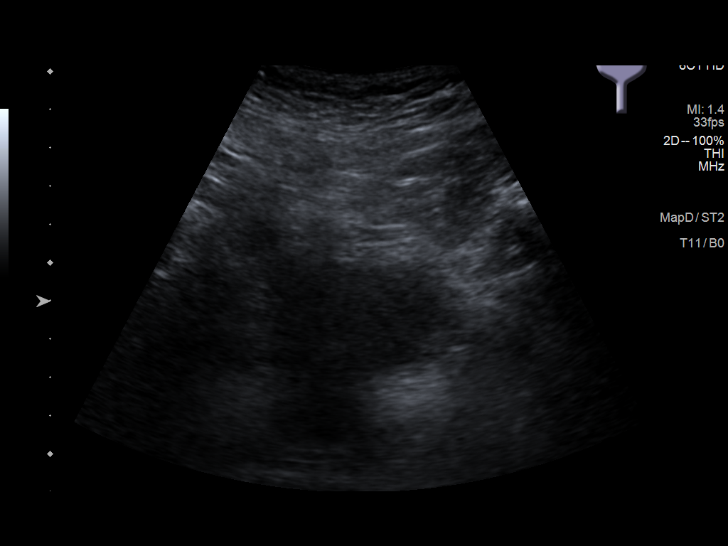

[14 of 25 positions shown; findings below may reference images not displayed]

FINDINGS: Right Kidney:

Renal measurements: 8.7 x 3.8 x 4.3 cm = volume: 73.6 mL.
Echogenicity within normal limits. No mass or hydronephrosis
visualized. No visible nephrolithiasis.

Left Kidney:

Renal measurements: 9.7 x 4.5 x 5.0 cm = volume: 115.1 mL.
Echogenicity within normal limits. No mass or hydronephrosis
visualized. No visible nephrolithiasis.

Bladder:

Appears normal for degree of bladder distention.

Other:

None.
IMPRESSION: 1. Renal asymmetry with the right kidney small as compared to the
left.
2. Otherwise unremarkable and normal renal ultrasound.

## 2019-09-08 ENCOUNTER — Other Ambulatory Visit: Payer: Self-pay | Admitting: Internal Medicine

## 2019-09-08 DIAGNOSIS — Z1231 Encounter for screening mammogram for malignant neoplasm of breast: Secondary | ICD-10-CM

## 2020-01-13 ENCOUNTER — Ambulatory Visit: Payer: Medicare PPO | Admitting: Dermatology

## 2020-01-13 ENCOUNTER — Other Ambulatory Visit: Payer: Self-pay

## 2020-01-13 DIAGNOSIS — D1800 Hemangioma unspecified site: Secondary | ICD-10-CM

## 2020-01-13 DIAGNOSIS — L578 Other skin changes due to chronic exposure to nonionizing radiation: Secondary | ICD-10-CM

## 2020-01-13 DIAGNOSIS — Z85828 Personal history of other malignant neoplasm of skin: Secondary | ICD-10-CM

## 2020-01-13 DIAGNOSIS — Z1283 Encounter for screening for malignant neoplasm of skin: Secondary | ICD-10-CM

## 2020-01-13 DIAGNOSIS — L819 Disorder of pigmentation, unspecified: Secondary | ICD-10-CM

## 2020-01-13 DIAGNOSIS — L719 Rosacea, unspecified: Secondary | ICD-10-CM | POA: Diagnosis not present

## 2020-01-13 DIAGNOSIS — L821 Other seborrheic keratosis: Secondary | ICD-10-CM | POA: Diagnosis not present

## 2020-01-13 DIAGNOSIS — D692 Other nonthrombocytopenic purpura: Secondary | ICD-10-CM

## 2020-01-13 DIAGNOSIS — D229 Melanocytic nevi, unspecified: Secondary | ICD-10-CM

## 2020-01-13 NOTE — Progress Notes (Signed)
   Follow-Up Visit   Subjective  Teresa [name] is a 79 y.o. female who presents for the following: FBSE.  Patient here for full body skin exam and skin cancer screening. She does a h/o cancer > 30 years ago at right forearm. Nothing new or changing that patient is aware of.   The following portions of the chart were reviewed this encounter and updated as appropriate:  Tobacco  Allergies  Meds  Problems  Med Hx  Surg Hx  Fam Hx      Review of Systems:  No other skin or systemic complaints except as noted in HPI or Assessment and Plan.  Objective  Well appearing patient in no apparent distress; mood and affect are within normal limits.  A full examination was performed including scalp, head, eyes, ears, nose, lips, neck, chest, axillae, abdomen, back, buttocks, bilateral upper extremities, bilateral lower extremities, hands, feet, fingers, toes, fingernails, and toenails. All findings within normal limits unless otherwise noted below.  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 Start Rhofade sample. Discussed BBL light treatment.  Given instructions for homemade topical with Afrin mixed in CeraVe PM.  She has doxycycline 20 mg at home to use twice a day as needed for flares (papules).  Advised not to use expired doxycycline.  She will call for refill as needed.  Recommend daily broad spectrum sunscreen SPF 30+ to sun-exposed areas, reapply every 2 hours as needed. Call for new or changing lesions.  Doxycycline should be taken with food to prevent nausea. Do not lay down for 30 minutes after taking. Be cautious with sun exposure and use good sun protection while on this medication.    Lentigines - Scattered tan macules - Discussed due to sun exposure - Benign, observe - Call for any changes  Seborrheic Keratoses - Stuck-on, waxy, tan-brown papules and plaques  - Discussed benign  etiology and prognosis. - Observe - Call for any changes  Melanocytic Nevi - Tan-brown and/or pink-flesh-colored symmetric macules and papules - Benign appearing on exam today - Observation - Call clinic for new or changing moles - Recommend daily use of broad spectrum spf 30+ sunscreen to sun-exposed areas.   Hemangiomas - Red papules - Discussed benign nature - Observe - Call for any changes  Actinic Damage - diffuse scaly erythematous macules with underlying dyspigmentation - Recommend daily broad spectrum sunscreen SPF 30+ to sun-exposed areas, reapply every 2 hours as needed.  - Call for new or changing lesions.  Skin cancer screening performed today.  History of Skin Cancer  -Clear. Observe for recurrence at right arm -Call clinic for new or changing lesions.   -Recommend regular skin exams, daily broad-spectrum spf 30+ sunscreen use, and photoprotection.     Purpura - Violaceous macules and patches at arms - Benign - Related to age, sun damage and/or use of blood thinners - Observe - Can use OTC arnica containing moisturizer such as Dermend Bruise Formula if desired - Call for worsening or other concerns  Return in about 1 year (around 01/12/2021) for TBSE.  Graciella Belton, RMA, am acting as scribe for Forest Gleason, MD .  Documentation: I have reviewed the above documentation for accuracy and completeness, and I agree with the above.  Forest Gleason, MD

## 2020-01-13 NOTE — Patient Instructions (Addendum)
Melanoma ABCDEs  Melanoma is the most dangerous type of skin cancer, and is the leading cause of death from skin disease.  You are more likely to develop melanoma if you:  Have light-colored skin, light-colored eyes, or red or blond hair  Spend a lot of time in the sun  Tan regularly, either outdoors or in a tanning bed  Have had blistering sunburns, especially during childhood  Have a close family member who has had a melanoma  Have atypical moles or large birthmarks  Early detection of melanoma is key since treatment is typically straightforward and cure rates are extremely high if we catch it early.   The first sign of melanoma is often a change in a mole or a new dark spot.  The ABCDE system is a way of remembering the signs of melanoma.  A for asymmetry:  The two halves do not match. B for border:  The edges of the growth are irregular. C for color:  A mixture of colors are present instead of an even brown color. D for diameter:  Melanomas are usually (but not always) greater than 2mm - the size of a pencil eraser. E for evolution:  The spot keeps changing in size, shape, and color.  Please check your skin once per month between visits. You can use a small mirror in front and a large mirror behind you to keep an eye on the back side or your body.   If you see any new or changing lesions before your next follow-up, please call to schedule a visit.  Please continue daily skin protection including broad spectrum sunscreen SPF 30+ to sun-exposed areas, reapplying every 2 hours as needed when you're outdoors.   For rosacea at face - 1 bottle of Afrin nasal spray mixed in 1 bottle of CeraVe pm moisturizer. Apply in the morning one hour before going out to help with flushing.

## 2020-01-15 ENCOUNTER — Encounter: Payer: Self-pay | Admitting: Dermatology

## 2020-02-28 ENCOUNTER — Other Ambulatory Visit: Payer: Self-pay

## 2020-02-28 ENCOUNTER — Ambulatory Visit
Admission: RE | Admit: 2020-02-28 | Discharge: 2020-02-28 | Disposition: A | Discharge status: Home / Self Care | Payer: Medicare PPO | Source: Ambulatory Visit | Attending: Internal Medicine | Admitting: Internal Medicine

## 2020-02-28 DIAGNOSIS — Z1231 Encounter for screening mammogram for malignant neoplasm of breast: Secondary | ICD-10-CM | POA: Diagnosis present

## 2020-02-28 IMAGING — MG DIGITAL SCREENING BILAT W/ TOMO W/ CAD
8 series · 9 of 24 positions shown · non-contrast
Comparison: Previous exam(s).

CLINICAL DATA: Screening.

EXAM:
DIGITAL SCREENING BILATERAL MAMMOGRAM WITH TOMO AND CAD

[R CC synth-2D]
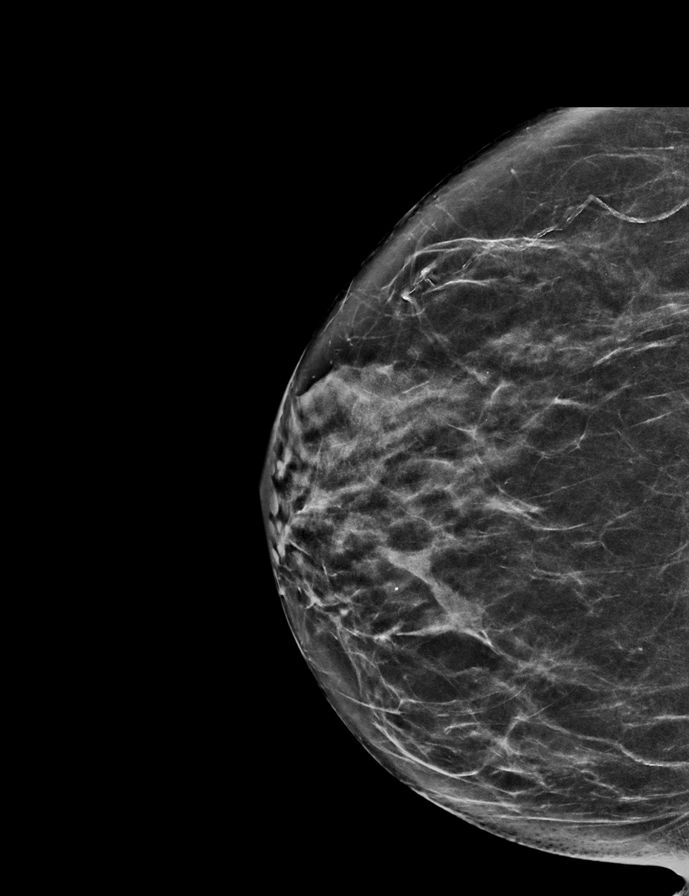

[L MLO synth-2D]
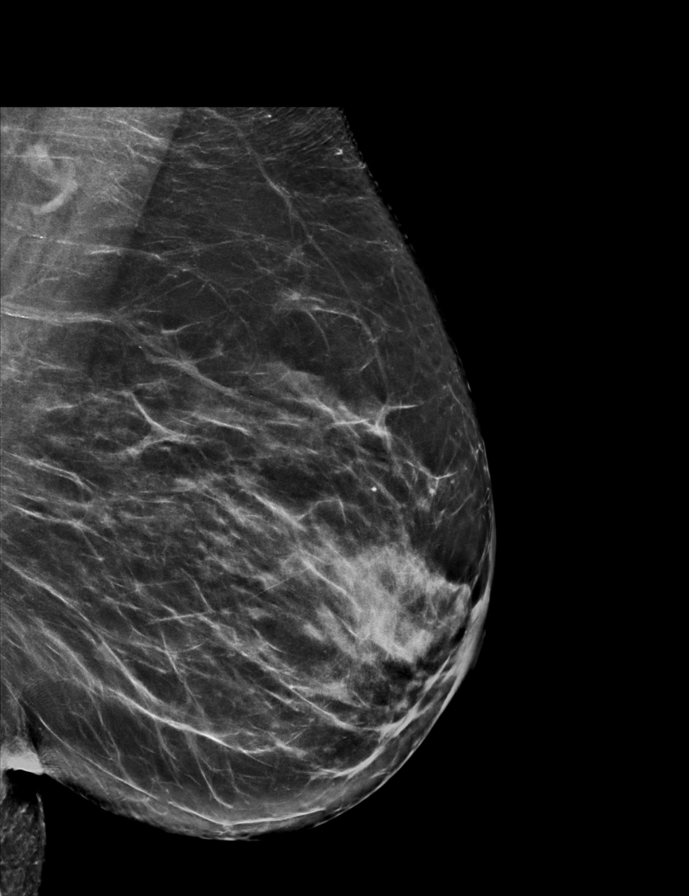

[L CC synth-2D]
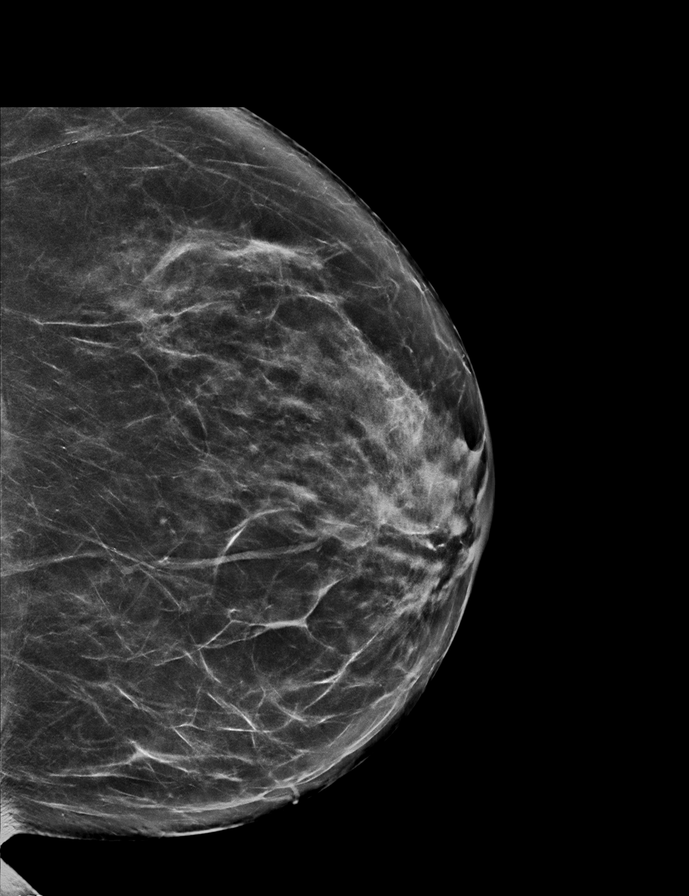

[R MLO synth-2D]
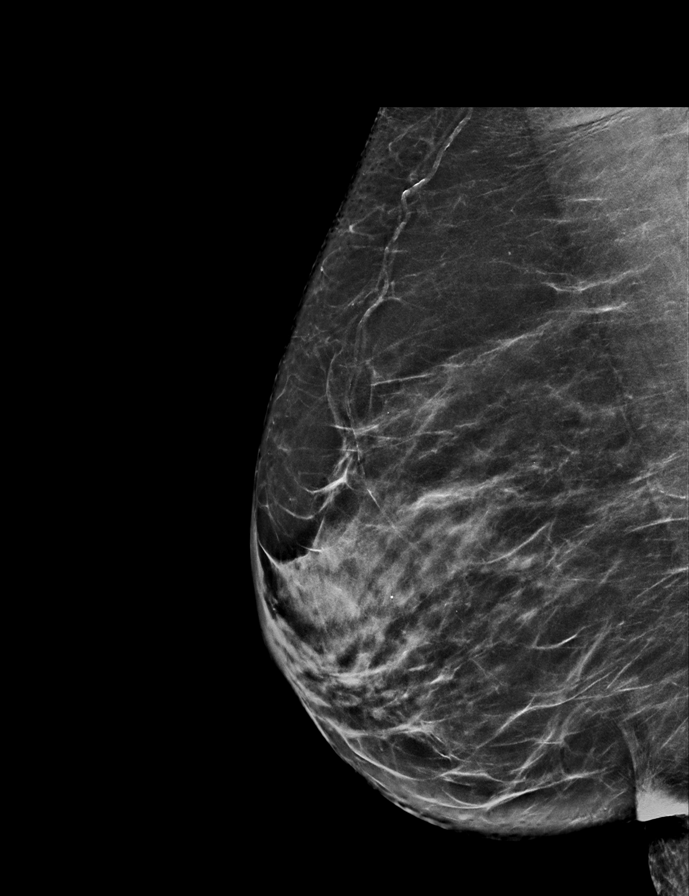

[R MLO tomo · 2 of 67 frames shown]
[frame 22/67]
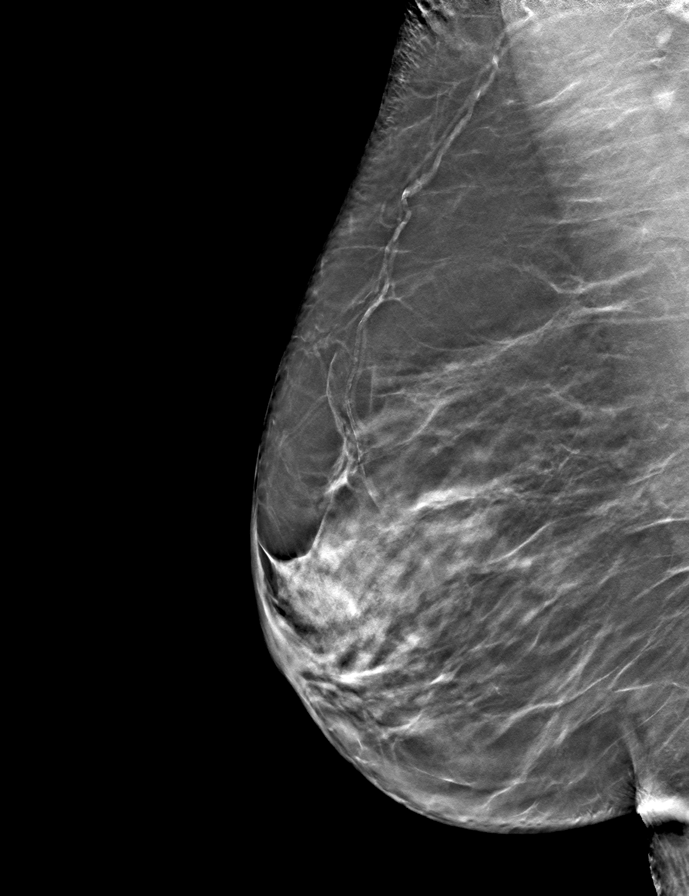
[frame 34/67]
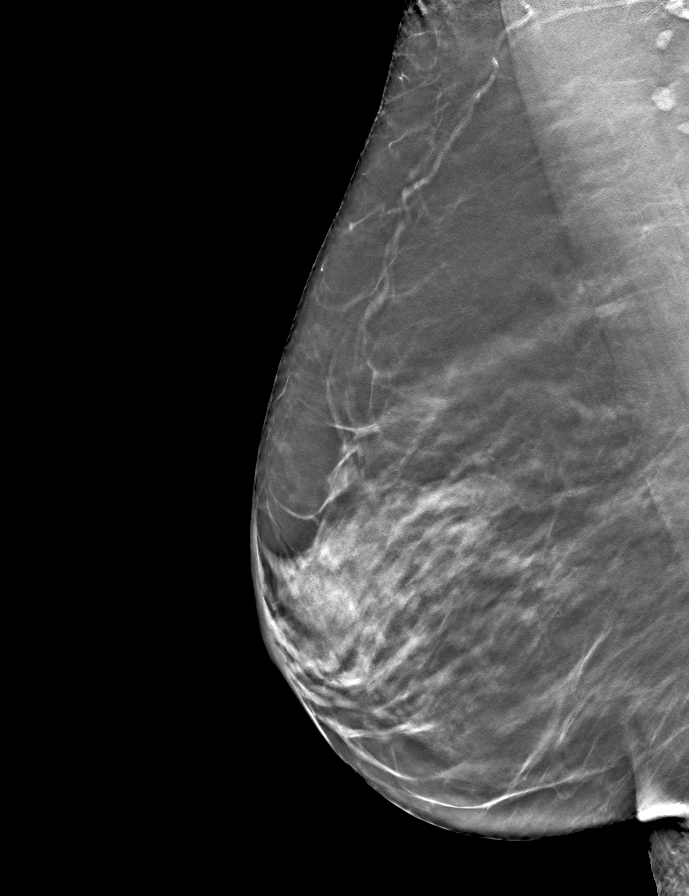

[L CC tomo · tomo slice 35/70.0]
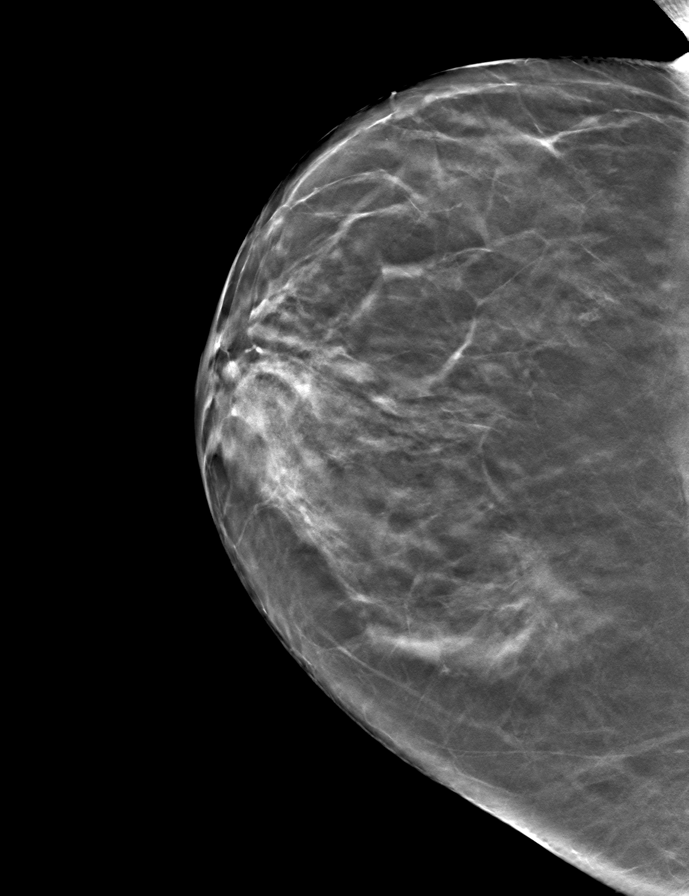

[R CC tomo · tomo slice 31/62.0]
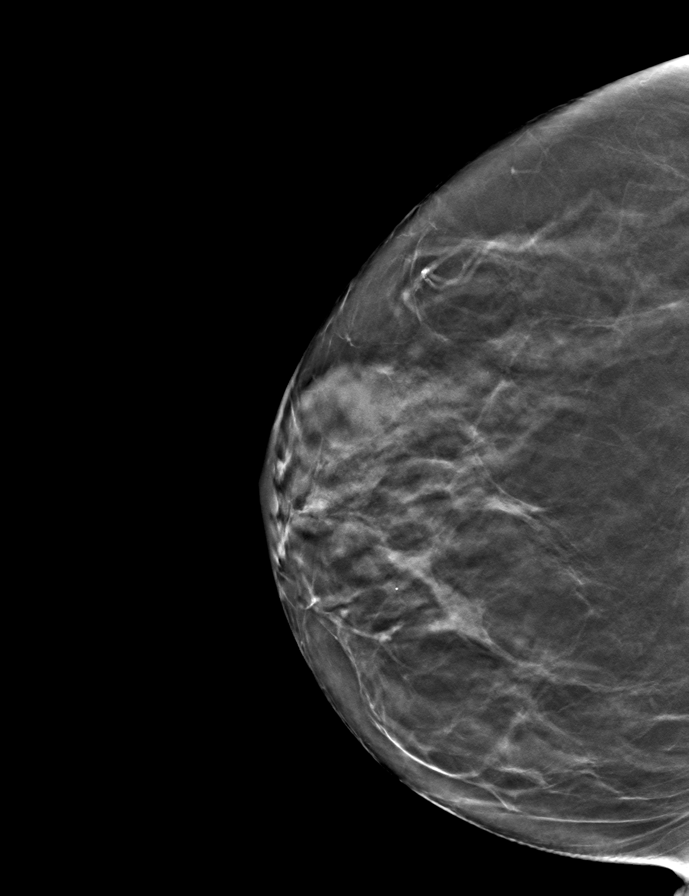

[L MLO tomo · tomo slice 37/74.0]
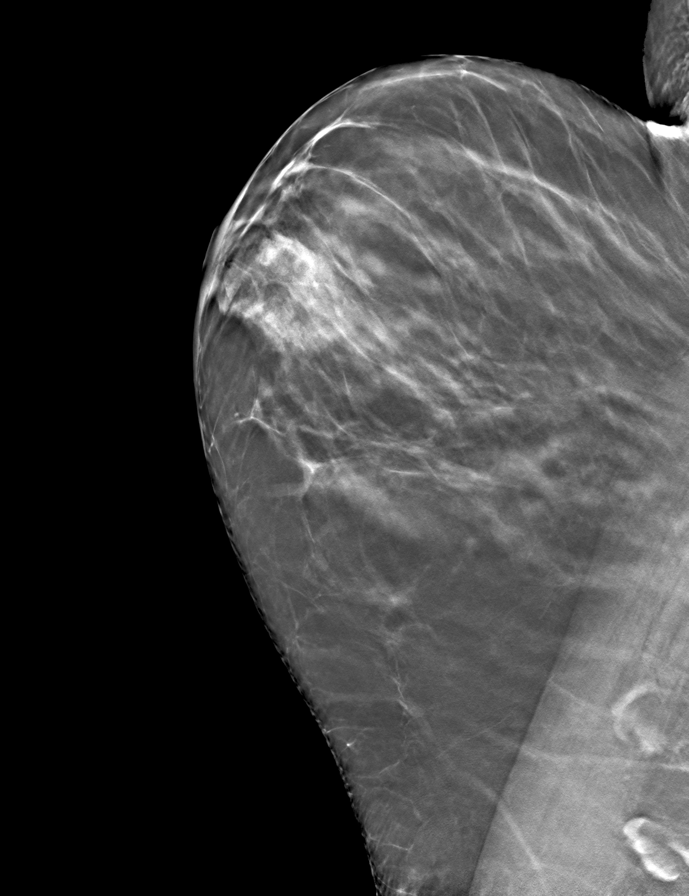

[9 of 24 positions shown; findings below may reference images not displayed]

ACR Breast Density Category c: The breast tissue is heterogeneously
dense, which may obscure small masses.
FINDINGS: There are no findings suspicious for malignancy. Images were
processed with CAD.
IMPRESSION: No mammographic evidence of malignancy. A result letter of this
screening mammogram will be mailed directly to the patient.

RECOMMENDATION:
Screening mammogram in one year. (Code:[5V])

BI-RADS CATEGORY  1: Negative.

## 2020-06-20 DIAGNOSIS — N1832 Chronic kidney disease, stage 3b: Secondary | ICD-10-CM | POA: Insufficient documentation

## 2020-09-20 ENCOUNTER — Other Ambulatory Visit: Payer: Self-pay | Admitting: Internal Medicine

## 2020-09-20 DIAGNOSIS — Z1231 Encounter for screening mammogram for malignant neoplasm of breast: Secondary | ICD-10-CM

## 2020-11-23 ENCOUNTER — Other Ambulatory Visit: Payer: Self-pay | Admitting: Nephrology

## 2020-11-23 DIAGNOSIS — N1832 Chronic kidney disease, stage 3b: Secondary | ICD-10-CM

## 2020-11-23 DIAGNOSIS — R8290 Unspecified abnormal findings in urine: Secondary | ICD-10-CM

## 2020-12-08 ENCOUNTER — Ambulatory Visit
Admission: RE | Admit: 2020-12-08 | Discharge: 2020-12-08 | Disposition: A | Discharge status: Home / Self Care | Payer: Medicare PPO | Source: Ambulatory Visit | Attending: Nephrology | Admitting: Nephrology

## 2020-12-08 ENCOUNTER — Other Ambulatory Visit: Payer: Self-pay

## 2020-12-08 DIAGNOSIS — R8290 Unspecified abnormal findings in urine: Secondary | ICD-10-CM | POA: Insufficient documentation

## 2020-12-08 DIAGNOSIS — N1832 Chronic kidney disease, stage 3b: Secondary | ICD-10-CM | POA: Insufficient documentation

## 2020-12-08 IMAGING — US US RENAL
1 series · 14 of 25 positions shown · non-contrast
Comparison: None.

CLINICAL DATA: Chronic renal disease

EXAM:
RENAL / URINARY TRACT ULTRASOUND COMPLETE

[Series 1: us renal · 0.25mm/px · 14 of 38 slices shown]
[im 1/38]
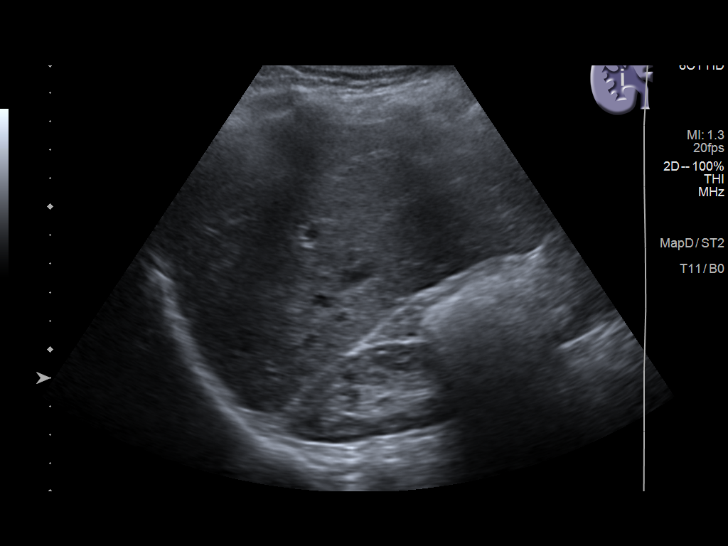
[im 4/38]
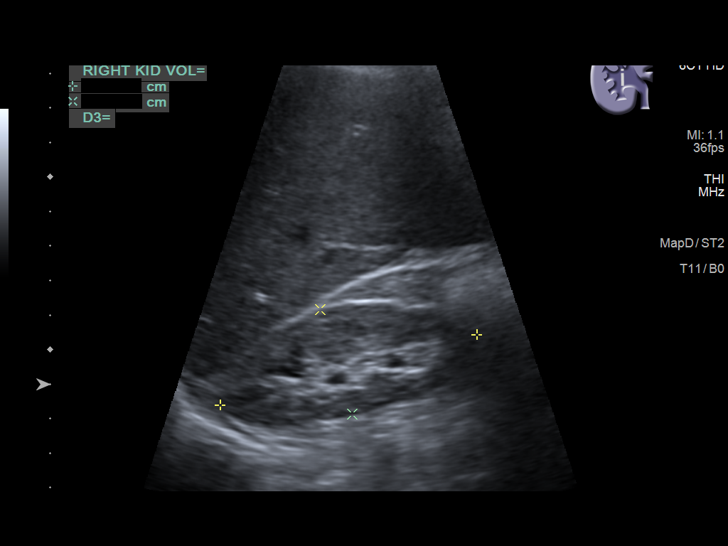
[im 7/38]
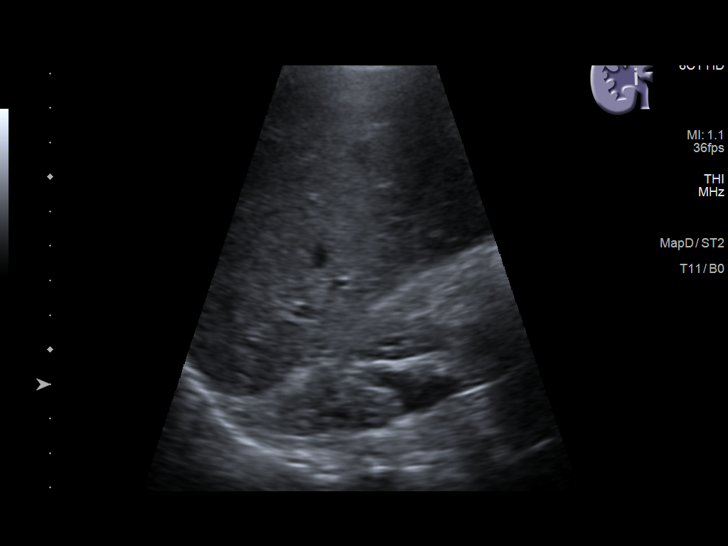
[im 10/38]
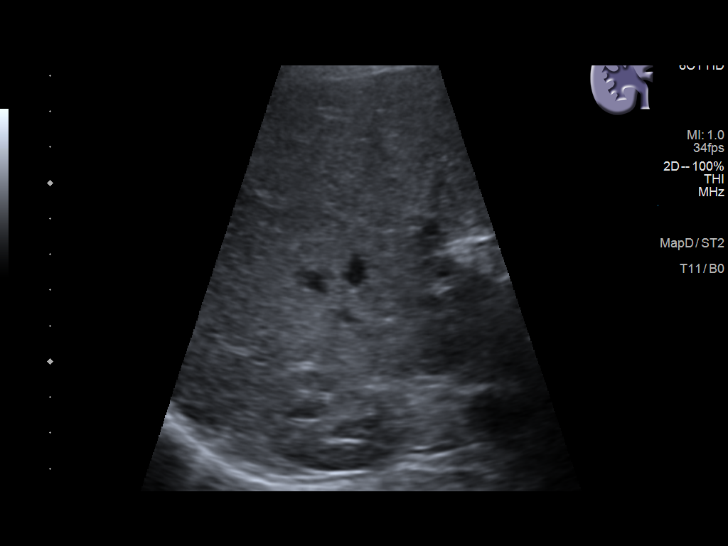
[im 13/38]
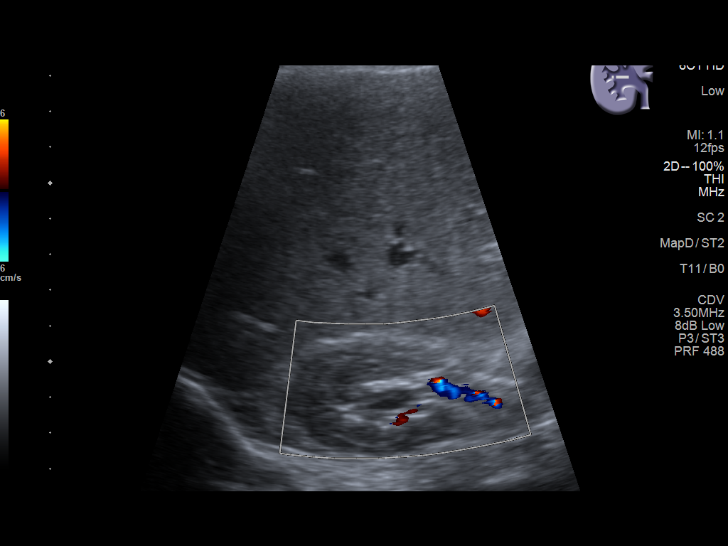
[im 14/38]
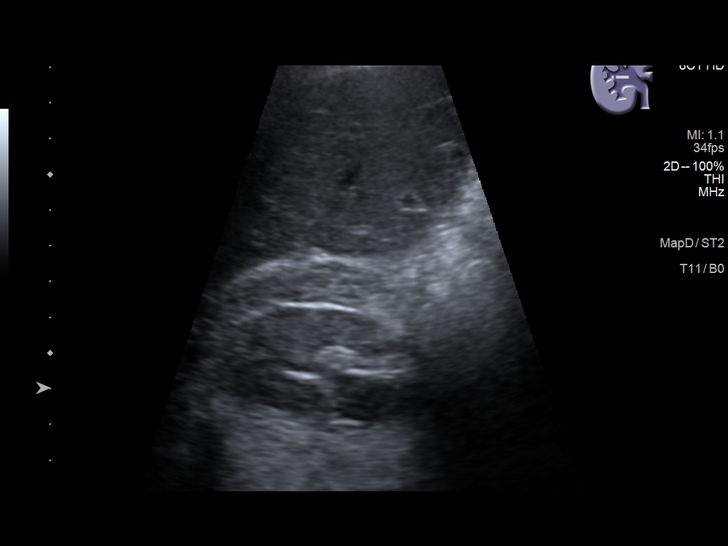
[im 17/38]
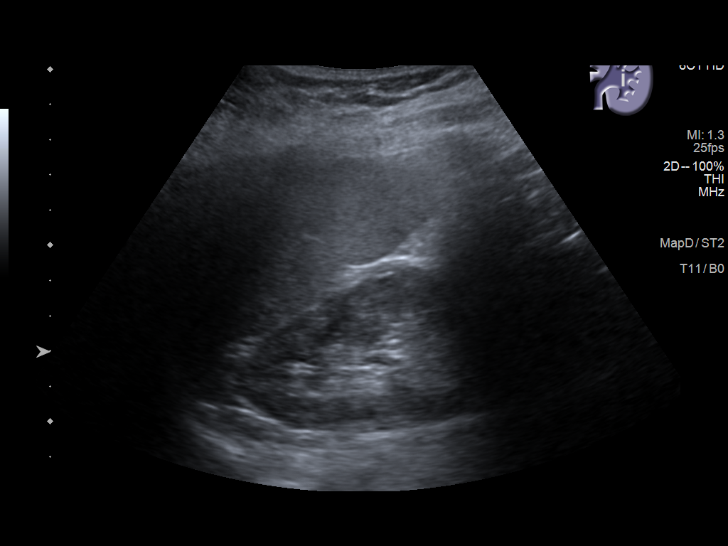
[im 21/38]
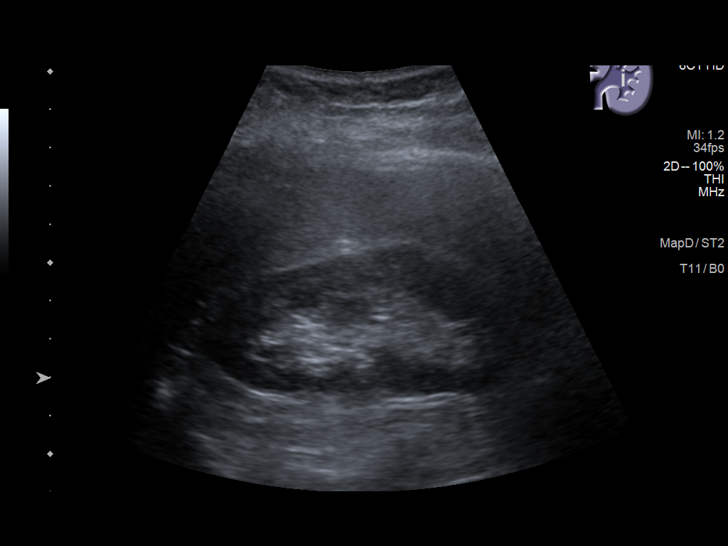
[im 24/38]
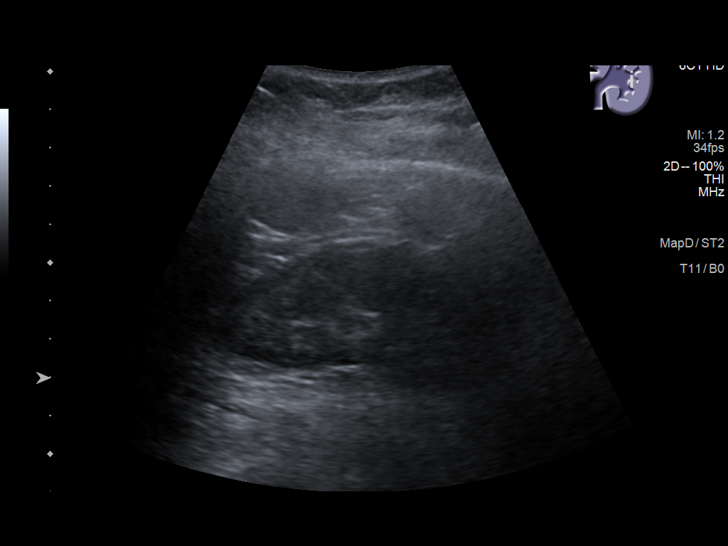
[im 25/38]
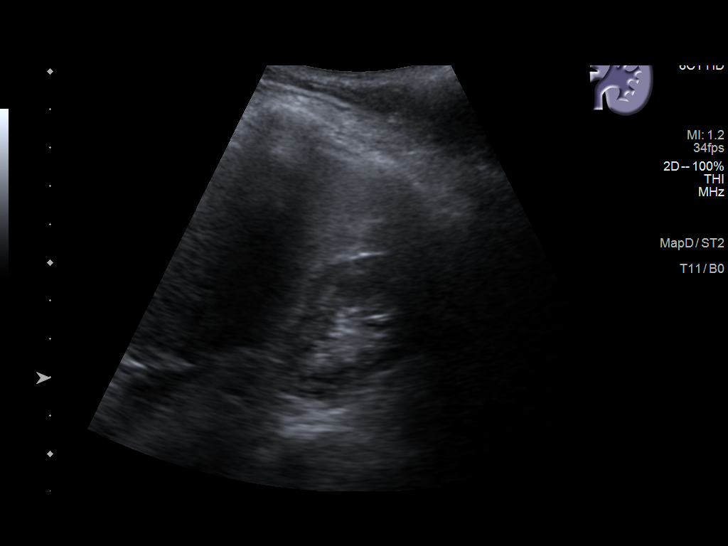
[im 28/38]
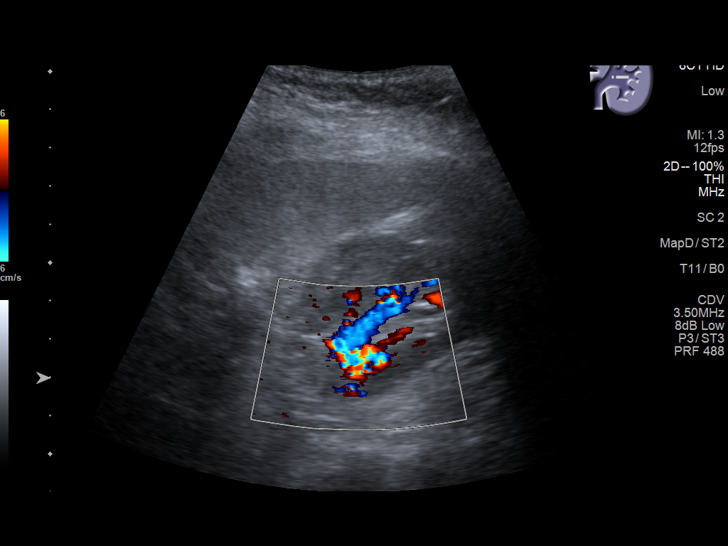
[im 31/38]
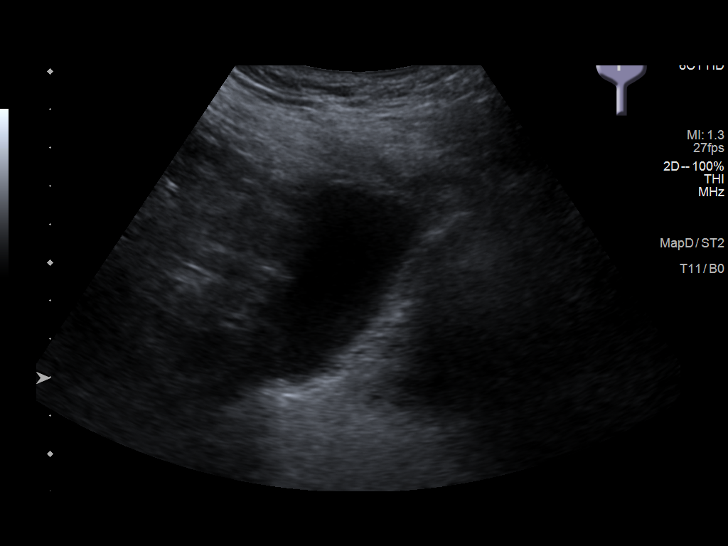
[im 34/38]
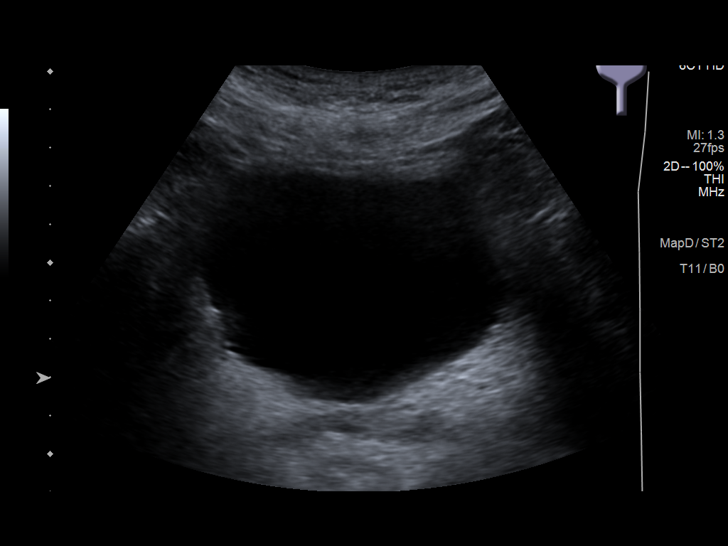
[im 38/38]
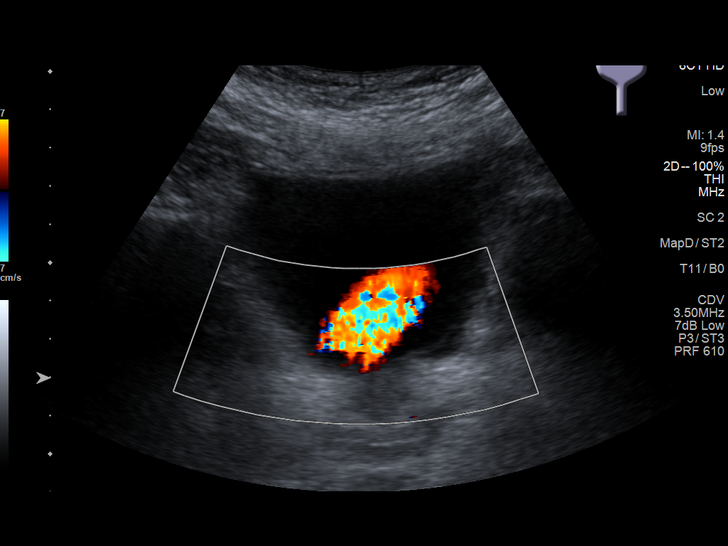

[14 of 25 positions shown; findings below may reference images not displayed]

FINDINGS: Right Kidney:

Renal measurements: 7.7 x 3.2 x 4.0 cm = volume: 51.7 mL.
Echogenicity within normal limits. No mass or hydronephrosis
visualized.

Left Kidney:

Renal measurements: 8.3 x 3.6 x 4.4 cm = volume: 68.0 mL.
Echogenicity within normal limits. No mass or hydronephrosis
visualized.

Bladder:

Appears normal for degree of bladder distention.

Other:

None.
IMPRESSION: The right kidney remains atrophic relative to the left. No other
abnormalities.

## 2021-01-18 ENCOUNTER — Ambulatory Visit: Payer: Medicare PPO | Admitting: Dermatology

## 2021-01-18 ENCOUNTER — Encounter: Payer: Self-pay | Admitting: Dermatology

## 2021-01-18 ENCOUNTER — Other Ambulatory Visit: Payer: Self-pay

## 2021-01-18 DIAGNOSIS — L570 Actinic keratosis: Secondary | ICD-10-CM | POA: Diagnosis not present

## 2021-01-18 DIAGNOSIS — L814 Other melanin hyperpigmentation: Secondary | ICD-10-CM

## 2021-01-18 DIAGNOSIS — D225 Melanocytic nevi of trunk: Secondary | ICD-10-CM

## 2021-01-18 DIAGNOSIS — D2260 Melanocytic nevi of unspecified upper limb, including shoulder: Secondary | ICD-10-CM

## 2021-01-18 DIAGNOSIS — L578 Other skin changes due to chronic exposure to nonionizing radiation: Secondary | ICD-10-CM

## 2021-01-18 DIAGNOSIS — D1801 Hemangioma of skin and subcutaneous tissue: Secondary | ICD-10-CM

## 2021-01-18 DIAGNOSIS — Z85828 Personal history of other malignant neoplasm of skin: Secondary | ICD-10-CM | POA: Diagnosis not present

## 2021-01-18 DIAGNOSIS — D2270 Melanocytic nevi of unspecified lower limb, including hip: Secondary | ICD-10-CM

## 2021-01-18 DIAGNOSIS — Z1283 Encounter for screening for malignant neoplasm of skin: Secondary | ICD-10-CM

## 2021-01-18 DIAGNOSIS — L719 Rosacea, unspecified: Secondary | ICD-10-CM

## 2021-01-18 DIAGNOSIS — L821 Other seborrheic keratosis: Secondary | ICD-10-CM

## 2021-01-18 MED ORDER — MIRVASO 0.33 % EX GEL: 1.0000 "application " | Freq: Every morning | CUTANEOUS | 11 refills | Status: AC

## 2021-01-18 MED ORDER — RHOFADE 1 % EX CREA: 1.0000 "application " | TOPICAL_CREAM | Freq: Every morning | CUTANEOUS | 6 refills | Status: DC

## 2021-01-18 MED ORDER — DOXYCYCLINE HYCLATE 20 MG PO TABS: 20.0000 mg | ORAL_TABLET | Freq: Two times a day (BID) | ORAL | 3 refills | Status: AC

## 2021-01-18 NOTE — Progress Notes (Signed)
Follow-Up Visit   Subjective  Teresa [name] is a 80 y.o. female who presents for the following: Annual Exam (Hx skin CA on right forearm - patient has noticed a peeling lesion on the L brow she would like checked today, and a lesion on her chest she would like checked.). The patient presents for Total-Body Skin Exam (TBSE) for skin cancer screening and mole check.   The following portions of the chart were reviewed this encounter and updated as appropriate:   Tobacco  Allergies  Meds  Problems  Med Hx  Surg Hx  Fam Hx      Review of Systems:  No other skin or systemic complaints except as noted in HPI or Assessment and Plan.  Objective  Well appearing patient in no apparent distress; mood and affect are within normal limits.  A full examination was performed including scalp, head, eyes, ears, nose, lips, neck, chest, axillae, abdomen, back, buttocks, bilateral upper extremities, bilateral lower extremities, hands, feet, fingers, toes, fingernails, and toenails. All findings within normal limits unless otherwise noted below.  Face Erythema of the cheeks and nose.  L forehead above brow x 1 Erythematous thin papules/macules with gritty scale.    Assessment & Plan  Rosacea Face  Chronic condition with duration over one year. Currently well-controlled.  Rosacea is a chronic progressive skin condition usually affecting the face of adults, causing redness and/or acne bumps. It is treatable but not curable. It sometimes affects the eyes (ocular rosacea) as well. It may respond to topical and/or systemic medication and can flare with stress, sun exposure, alcohol, exercise and some foods.  Daily application of broad spectrum spf 30+ sunscreen to face is recommended to reduce flares.  Continue Afrin/CeraVe mix QAM. For temporary redness reduction Mix 1 bottle of Afrin original in 1 bottle of Cerave PM, shake well, and apply 1 hour before you want redness to be improved in the  morning  Continue Doxycycline 20mg  po BID PRN flares. Doxycycline should be taken with food to prevent nausea. Do not lay down for 30 minutes after taking. Be cautious with sun exposure and use good sun protection while on this medication. Pregnant women should not take this medication.      doxycycline (PERIOSTAT) 20 MG tablet - Face Take 1 tablet (20 mg total) by mouth 2 (two) times daily. Take with food.  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 Recommend Vaseline ointment to treated areas while healing.   Call if not resolved after cryotherapy  Actinic keratoses are precancerous spots that appear secondary to cumulative UV radiation exposure/sun exposure over time. They are chronic with expected duration over 1 year. A portion of actinic keratoses will progress to squamous cell carcinoma of the skin. It is not possible to reliably predict which spots will progress to skin cancer and so treatment is recommended to prevent development of skin cancer.  Recommend daily broad spectrum sunscreen SPF 30+ to sun-exposed areas, reapply every 2 hours as needed.  Recommend staying in the shade or wearing long sleeves, sun glasses (UVA+UVB protection) and wide brim hats (4-inch brim around the entire circumference of the hat). Call for new or changing lesions.   Destruction of lesion - L forehead above brow x 1 Complexity: simple   Destruction method: cryotherapy   Informed consent: discussed and consent obtained   Timeout:  patient name, date of  birth, surgical site, and procedure verified Lesion destroyed using liquid nitrogen: Yes   Region frozen until ice ball extended beyond lesion: Yes   Outcome: patient tolerated procedure well with no complications   Post-procedure details: wound care instructions given    Lentigines - Scattered tan macules -  Due to sun exposure - Benign-appearing, observe - Recommend daily broad spectrum sunscreen SPF 30+ to sun-exposed areas, reapply every 2 hours as needed. - Call for any changes  Seborrheic Keratoses - chest, trunk, extremities - Stuck-on, waxy, tan-brown papules and/or plaques  - Benign-appearing - Discussed benign etiology and prognosis. - Observe - Call for any changes  Melanocytic Nevi - chest, trunk, extremities - Tan-brown and/or pink-flesh-colored symmetric macules and papules - Benign appearing on exam today - Observation - Call clinic for new or changing moles - Recommend daily use of broad spectrum spf 30+ sunscreen to sun-exposed areas.   Hemangiomas - Red papules - Discussed benign nature - Observe - Call for any changes  Actinic Damage - Chronic condition, secondary to cumulative UV/sun exposure - diffuse scaly erythematous macules with underlying dyspigmentation - Recommend daily broad spectrum sunscreen SPF 30+ to sun-exposed areas, reapply every 2 hours as needed.  - Staying in the shade or wearing long sleeves, sun glasses (UVA+UVB protection) and wide brim hats (4-inch brim around the entire circumference of the hat) are also recommended for sun protection.  - Call for new or changing lesions.  History of Skin Cancer - R forearm, tx 20 years ago. - No evidence of recurrence today - No lymphadenopathy - Recommend regular full body skin exams - Recommend daily broad spectrum sunscreen SPF 30+ to sun-exposed areas, reapply every 2 hours as needed.  - Call if any new or changing lesions are noted between office visits  Skin cancer screening performed today.  Return in about 1 year (around 01/18/2022) for TBSE.  [name] Redo, CMA, am acting as scribe for Forest Gleason, MD .  Documentation: I have reviewed the above documentation for accuracy and completeness, and I agree with the above.  Forest Gleason, MD

## 2021-01-18 NOTE — Patient Instructions (Signed)

## 2021-02-26 ENCOUNTER — Emergency Department: Payer: Medicare PPO

## 2021-02-26 ENCOUNTER — Observation Stay: Payer: Medicare PPO

## 2021-02-26 ENCOUNTER — Other Ambulatory Visit: Payer: Self-pay

## 2021-02-26 ENCOUNTER — Encounter: Payer: Self-pay | Admitting: Radiology

## 2021-02-26 ENCOUNTER — Inpatient Hospital Stay
Admission: EM | Admit: 2021-02-26 | Discharge: 2021-03-01 | DRG: 065 | Disposition: A | Discharge status: Rehabilitation Facility | Payer: Medicare PPO | Attending: Student | Admitting: Student

## 2021-02-26 DIAGNOSIS — I639 Cerebral infarction, unspecified: Secondary | ICD-10-CM | POA: Diagnosis present

## 2021-02-26 DIAGNOSIS — E876 Hypokalemia: Secondary | ICD-10-CM | POA: Diagnosis not present

## 2021-02-26 DIAGNOSIS — N179 Acute kidney failure, unspecified: Secondary | ICD-10-CM

## 2021-02-26 DIAGNOSIS — E639 Nutritional deficiency, unspecified: Secondary | ICD-10-CM | POA: Diagnosis not present

## 2021-02-26 DIAGNOSIS — I63511 Cerebral infarction due to unspecified occlusion or stenosis of right middle cerebral artery: Secondary | ICD-10-CM | POA: Diagnosis not present

## 2021-02-26 DIAGNOSIS — R29810 Facial weakness: Secondary | ICD-10-CM | POA: Diagnosis present

## 2021-02-26 DIAGNOSIS — E785 Hyperlipidemia, unspecified: Secondary | ICD-10-CM | POA: Diagnosis present

## 2021-02-26 DIAGNOSIS — I129 Hypertensive chronic kidney disease with stage 1 through stage 4 chronic kidney disease, or unspecified chronic kidney disease: Secondary | ICD-10-CM | POA: Diagnosis present

## 2021-02-26 DIAGNOSIS — Z885 Allergy status to narcotic agent status: Secondary | ICD-10-CM

## 2021-02-26 DIAGNOSIS — Z6826 Body mass index (BMI) 26.0-26.9, adult: Secondary | ICD-10-CM

## 2021-02-26 DIAGNOSIS — R109 Unspecified abdominal pain: Secondary | ICD-10-CM

## 2021-02-26 DIAGNOSIS — R531 Weakness: Secondary | ICD-10-CM | POA: Diagnosis not present

## 2021-02-26 DIAGNOSIS — Z90710 Acquired absence of both cervix and uterus: Secondary | ICD-10-CM

## 2021-02-26 DIAGNOSIS — R29710 NIHSS score 10: Secondary | ICD-10-CM | POA: Diagnosis present

## 2021-02-26 DIAGNOSIS — R1312 Dysphagia, oropharyngeal phase: Secondary | ICD-10-CM | POA: Diagnosis present

## 2021-02-26 DIAGNOSIS — N1832 Chronic kidney disease, stage 3b: Secondary | ICD-10-CM | POA: Diagnosis present

## 2021-02-26 DIAGNOSIS — E039 Hypothyroidism, unspecified: Secondary | ICD-10-CM | POA: Diagnosis present

## 2021-02-26 DIAGNOSIS — Z79890 Hormone replacement therapy (postmenopausal): Secondary | ICD-10-CM

## 2021-02-26 DIAGNOSIS — R4781 Slurred speech: Secondary | ICD-10-CM

## 2021-02-26 DIAGNOSIS — Z882 Allergy status to sulfonamides status: Secondary | ICD-10-CM

## 2021-02-26 DIAGNOSIS — Z85828 Personal history of other malignant neoplasm of skin: Secondary | ICD-10-CM

## 2021-02-26 DIAGNOSIS — Z9049 Acquired absence of other specified parts of digestive tract: Secondary | ICD-10-CM

## 2021-02-26 DIAGNOSIS — E669 Obesity, unspecified: Secondary | ICD-10-CM | POA: Diagnosis present

## 2021-02-26 DIAGNOSIS — R471 Dysarthria and anarthria: Secondary | ICD-10-CM | POA: Diagnosis present

## 2021-02-26 DIAGNOSIS — R338 Other retention of urine: Secondary | ICD-10-CM | POA: Diagnosis not present

## 2021-02-26 DIAGNOSIS — G459 Transient cerebral ischemic attack, unspecified: Secondary | ICD-10-CM

## 2021-02-26 DIAGNOSIS — Z20822 Contact with and (suspected) exposure to covid-19: Secondary | ICD-10-CM | POA: Diagnosis present

## 2021-02-26 DIAGNOSIS — Z79899 Other long term (current) drug therapy: Secondary | ICD-10-CM

## 2021-02-26 DIAGNOSIS — I7300 Raynaud's syndrome without gangrene: Secondary | ICD-10-CM | POA: Diagnosis present

## 2021-02-26 DIAGNOSIS — Z981 Arthrodesis status: Secondary | ICD-10-CM

## 2021-02-26 DIAGNOSIS — G8194 Hemiplegia, unspecified affecting left nondominant side: Secondary | ICD-10-CM | POA: Diagnosis present

## 2021-02-26 DIAGNOSIS — Z880 Allergy status to penicillin: Secondary | ICD-10-CM

## 2021-02-26 DIAGNOSIS — N3289 Other specified disorders of bladder: Secondary | ICD-10-CM | POA: Diagnosis present

## 2021-02-26 LAB — CBC
HCT: 39.4 % (ref 36.0–46.0)
Hemoglobin: 13.3 g/dL (ref 12.0–15.0)
MCH: 30.4 pg (ref 26.0–34.0)
MCHC: 33.8 g/dL (ref 30.0–36.0)
MCV: 90 fL (ref 80.0–100.0)
Platelets: 183 10*3/uL (ref 150–400)
RBC: 4.38 MIL/uL (ref 3.87–5.11)
RDW: 12.2 % (ref 11.5–15.5)
WBC: 4.6 10*3/uL (ref 4.0–10.5)
nRBC: 0 % (ref 0.0–0.2)

## 2021-02-26 LAB — URINE DRUG SCREEN, QUALITATIVE (ARMC ONLY)
Amphetamines, Ur Screen: NOT DETECTED
Barbiturates, Ur Screen: NOT DETECTED
Benzodiazepine, Ur Scrn: NOT DETECTED
Cannabinoid 50 Ng, Ur ~~LOC~~: NOT DETECTED
Cocaine Metabolite,Ur ~~LOC~~: NOT DETECTED
MDMA (Ecstasy)Ur Screen: NOT DETECTED
Methadone Scn, Ur: NOT DETECTED
Opiate, Ur Screen: NOT DETECTED
Phencyclidine (PCP) Ur S: NOT DETECTED
Tricyclic, Ur Screen: NOT DETECTED

## 2021-02-26 LAB — DIFFERENTIAL
Abs Immature Granulocytes: 0.01 10*3/uL (ref 0.00–0.07)
Basophils Absolute: 0 10*3/uL (ref 0.0–0.1)
Basophils Relative: 1 %
Eosinophils Absolute: 0.1 10*3/uL (ref 0.0–0.5)
Eosinophils Relative: 2 %
Immature Granulocytes: 0 %
Lymphocytes Relative: 35 %
Lymphs Abs: 1.6 10*3/uL (ref 0.7–4.0)
Monocytes Absolute: 0.3 10*3/uL (ref 0.1–1.0)
Monocytes Relative: 6 %
Neutro Abs: 2.6 10*3/uL (ref 1.7–7.7)
Neutrophils Relative %: 56 %

## 2021-02-26 LAB — URINALYSIS, ROUTINE W REFLEX MICROSCOPIC
Bilirubin Urine: NEGATIVE
Glucose, UA: NEGATIVE mg/dL
Hgb urine dipstick: NEGATIVE
Ketones, ur: NEGATIVE mg/dL
Leukocytes,Ua: NEGATIVE
Nitrite: NEGATIVE
Protein, ur: NEGATIVE mg/dL
Specific Gravity, Urine: 1.005 (ref 1.005–1.030)
pH: 8 (ref 5.0–8.0)

## 2021-02-26 LAB — COMPREHENSIVE METABOLIC PANEL
ALT: 23 U/L (ref 0–44)
AST: 30 U/L (ref 15–41)
Albumin: 3.9 g/dL (ref 3.5–5.0)
Alkaline Phosphatase: 68 U/L (ref 38–126)
Anion gap: 6 (ref 5–15)
BUN: 25 mg/dL — ABNORMAL HIGH (ref 8–23)
CO2: 29 mmol/L (ref 22–32)
Calcium: 9.2 mg/dL (ref 8.9–10.3)
Chloride: 105 mmol/L (ref 98–111)
Creatinine, Ser: 1.13 mg/dL — ABNORMAL HIGH (ref 0.44–1.00)
GFR, Estimated: 50 mL/min — ABNORMAL LOW (ref >60)
Glucose, Bld: 104 mg/dL — ABNORMAL HIGH (ref 70–99)
Potassium: 4.1 mmol/L (ref 3.5–5.1)
Sodium: 140 mmol/L (ref 135–145)
Total Bilirubin: 0.6 mg/dL (ref 0.3–1.2)
Total Protein: 8 g/dL (ref 6.5–8.1)

## 2021-02-26 LAB — LACTIC ACID, PLASMA: Lactic Acid, Venous: 0.9 mmol/L (ref 0.5–1.9)

## 2021-02-26 LAB — ETHANOL: Alcohol, Ethyl (B): <10 mg/dL (ref <10)

## 2021-02-26 LAB — RESP PANEL BY RT-PCR (FLU A&B, COVID) ARPGX2
Influenza A by PCR: NEGATIVE
Influenza B by PCR: NEGATIVE
SARS Coronavirus 2 by RT PCR: NEGATIVE

## 2021-02-26 LAB — LDL CHOLESTEROL, DIRECT: Direct LDL: 83.7 mg/dL (ref 0–99)

## 2021-02-26 LAB — CBG MONITORING, ED: Glucose-Capillary: 78 mg/dL (ref 70–99)

## 2021-02-26 LAB — PROTIME-INR
INR: 1 (ref 0.8–1.2)
Prothrombin Time: 13.1 seconds (ref 11.4–15.2)

## 2021-02-26 LAB — APTT: aPTT: 34 seconds (ref 24–36)

## 2021-02-26 IMAGING — MR MR MRA HEAD W/O CM
1 series · 19 of 48 positions shown · non-contrast
Comparison: None.

CLINICAL DATA: Neuro deficit, acute, stroke suspected

EXAM:
MRA HEAD WITHOUT CONTRAST
MRA NECK WITHOUT CONTRAST
TECHNIQUE: Multiplanar, multiecho pulse sequences of the brain and surrounding
structures were obtained without intravenous contrast. Angiographic
images of the Circle of Willis were obtained using MRA technique
without intravenous contrast. Angiographic images of the neck were
obtained using MRA technique without intravenous contrast. Carotid
stenosis measurements (when applicable) are obtained utilizing
NASCET criteria, using the distal internal carotid diameter as the
denominator.

[Series 13: TOF · axial · 0.5mm · 0.41mm/px · z∈[-112,-15]mm · 19 of 205 slices shown]
[im 1/205]
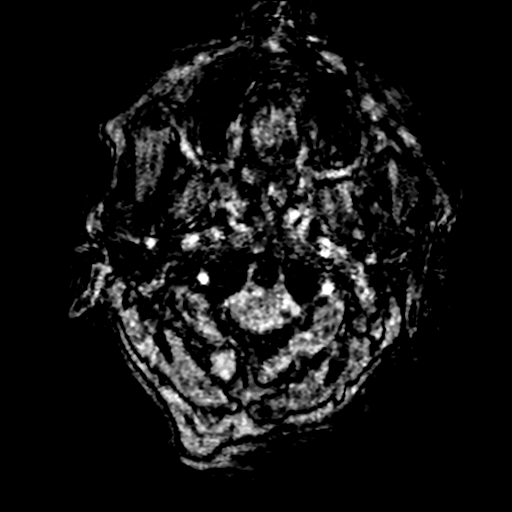
[im 5/205]
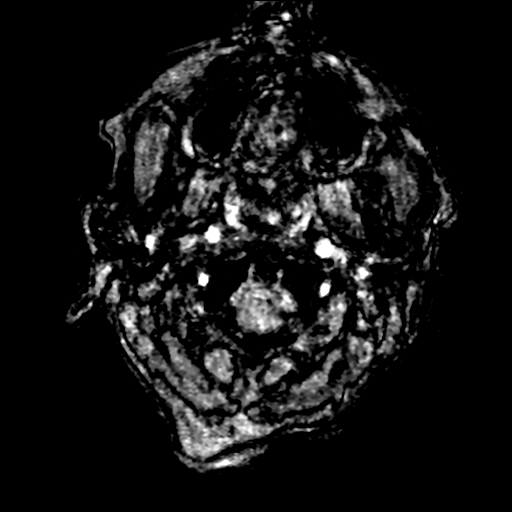
[im 9/205]
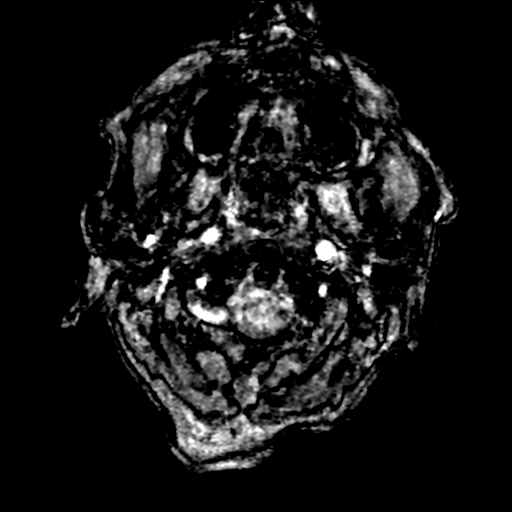
[im 14/205]
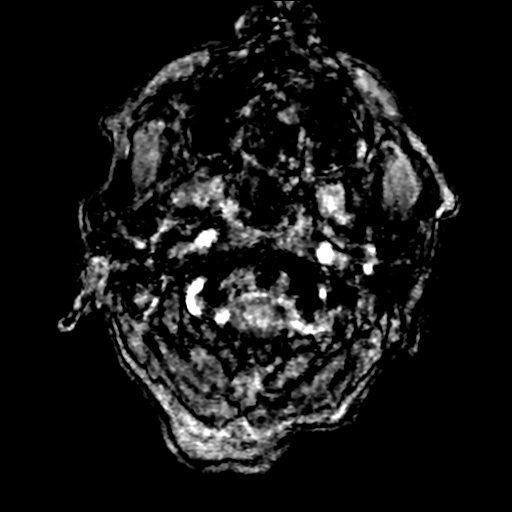
[im 18/205]
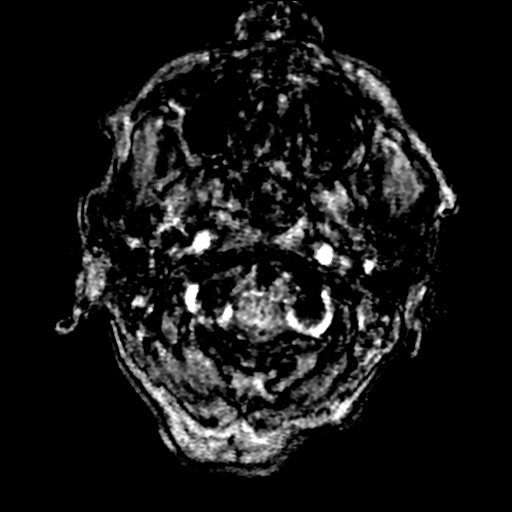
[im 22/205]
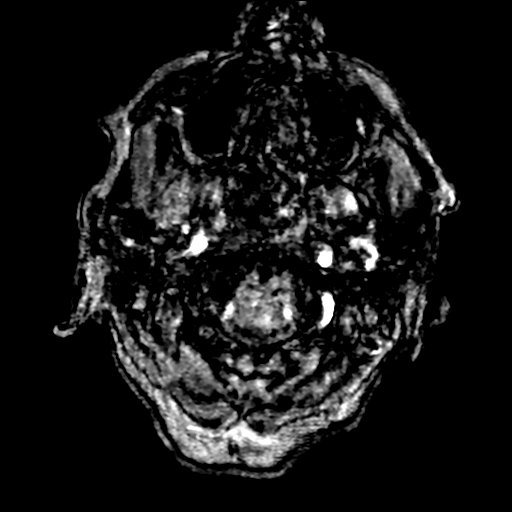
[im 27/205]
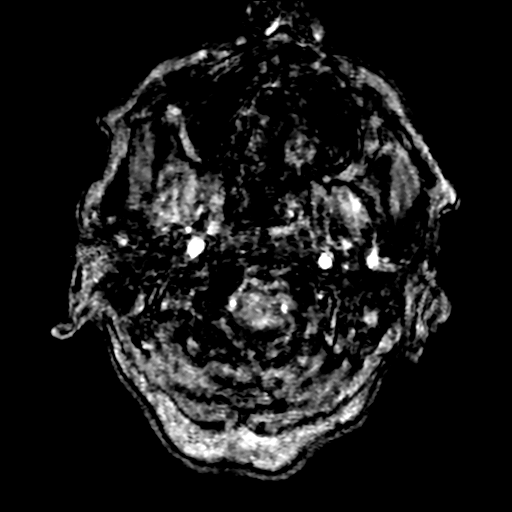
[im 31/205]
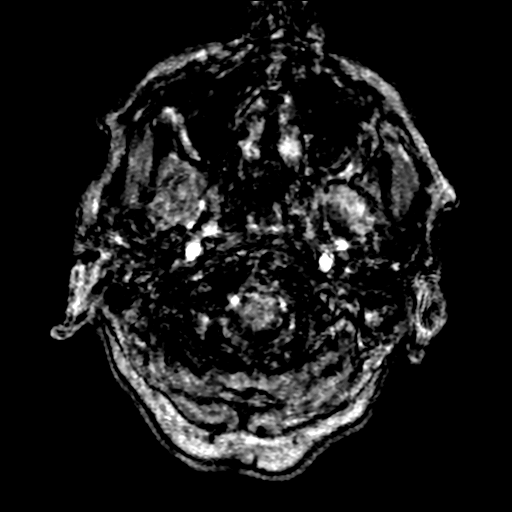
[im 35/205]
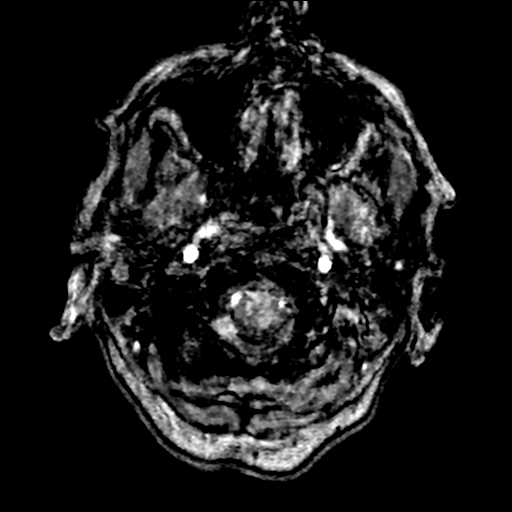
[im 40/205]
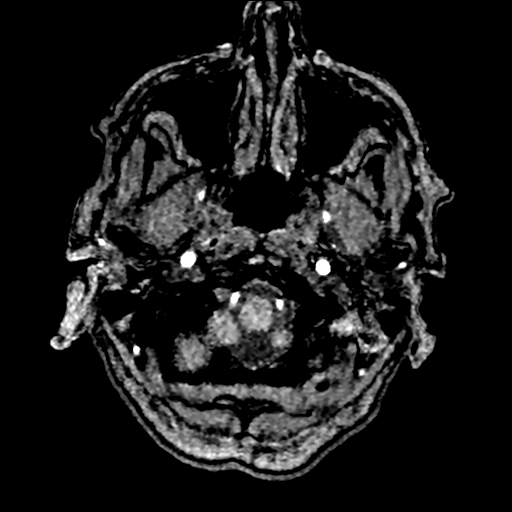
[im 44/205]
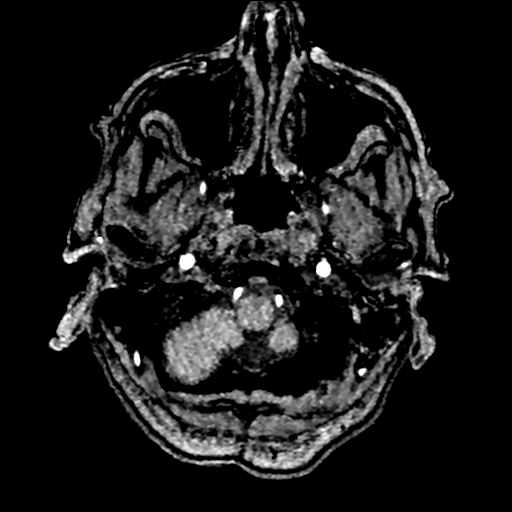
[im 66/205]
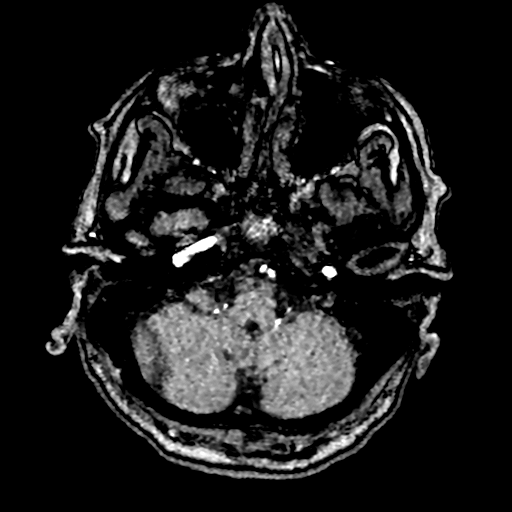
[im 92/205]
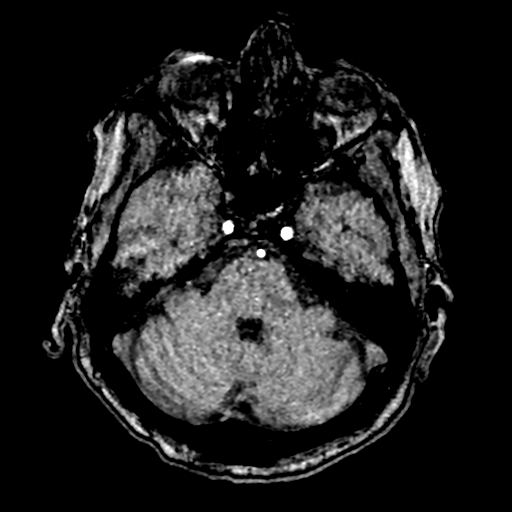
[im 105/205]
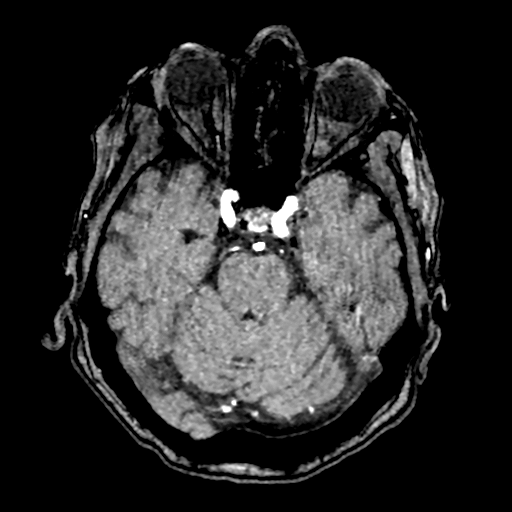
[im 118/205]
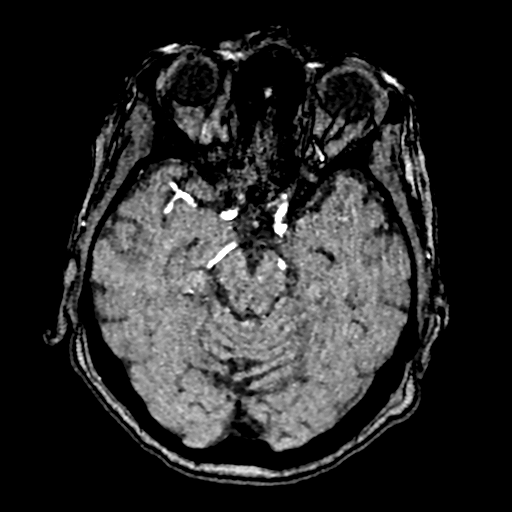
[im 144/205]
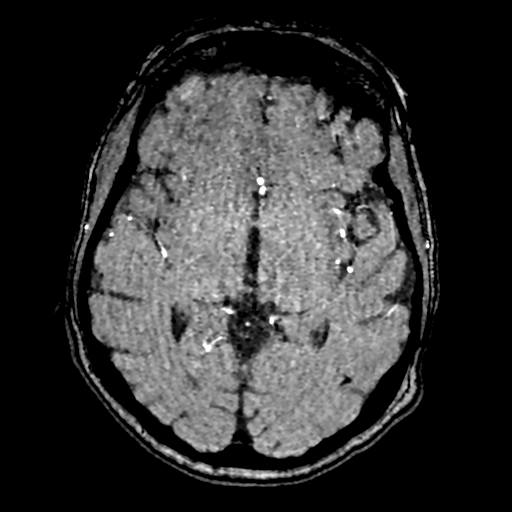
[im 170/205]
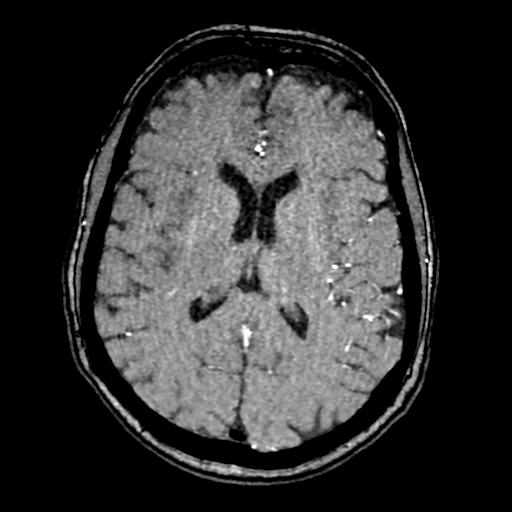
[im 174/205]
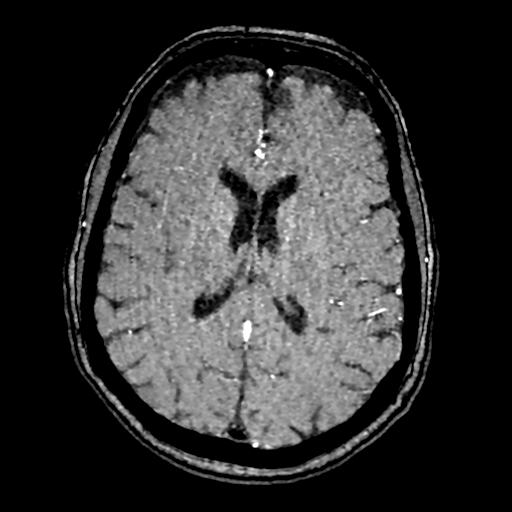
[im 196/205]
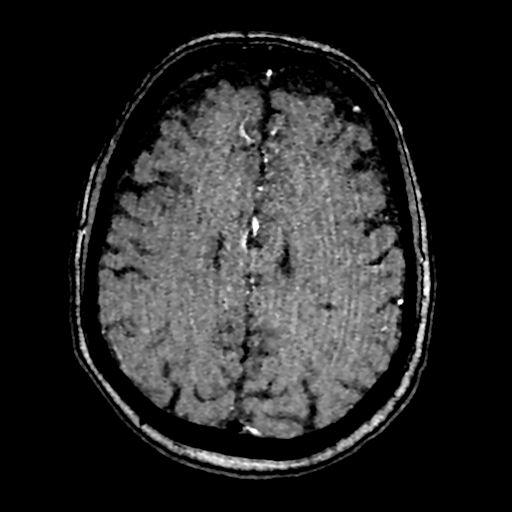

[19 of 48 positions shown; findings below may reference images not displayed]

FINDINGS: MRA HEAD FINDINGS

Intracranial internal carotid arteries are patent. Question of
high-grade stenosis or occlusion of proximal right M2 MCA. Anterior
and middle cerebral arteries are otherwise patent. Intracranial
vertebral arteries are patent. Basilar artery is patent. Posterior
cerebral arteries are patent. Fetal origin of the left PCA.

MRA NECK FINDINGS

Motion artifact is present. Great vessel origins and vertebral
origins are poorly evaluated. The visualized common carotids are
patent. Internal and external carotid arteries are patent.
Visualized extracranial vertebral arteries appear patent.
IMPRESSION: Question high-grade stenosis or occlusion of proximal right M2 MCA.

No occlusion or hemodynamically significant stenosis the neck.

These results were called by telephone at the time of interpretation
on [DATE] at [DATE] to provider [REDACTED] , who verbally
acknowledged these results.

## 2021-02-26 IMAGING — MR MR HEAD W/O CM
8 series · 48 of 48 positions shown · non-contrast
Comparison: None.

CLINICAL DATA: Left-sided weakness and slurred speech

EXAM:
MRI HEAD WITHOUT CONTRAST
TECHNIQUE: Multiplanar, multiecho pulse sequences of the brain and surrounding
structures were obtained without intravenous contrast.

[Series 5: ax dwi_tracew · axial · 3.0mm · 0.71mm/px · z∈[-101,+63]mm · 8 of 56 slices shown]
[im 1/56]
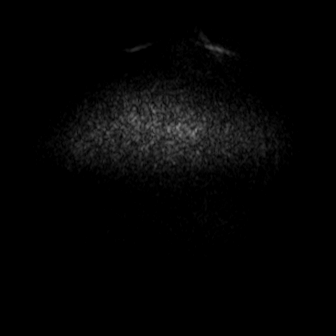
[im 8/56]
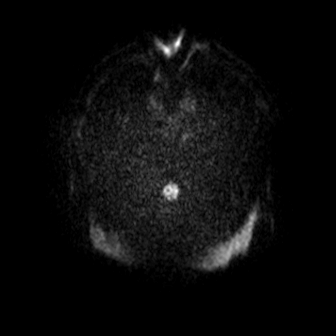
[im 16/56]
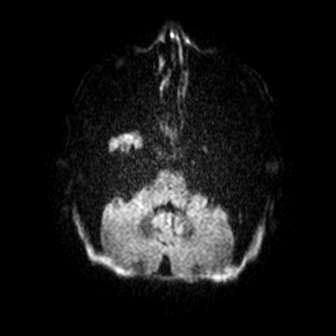
[im 24/56]
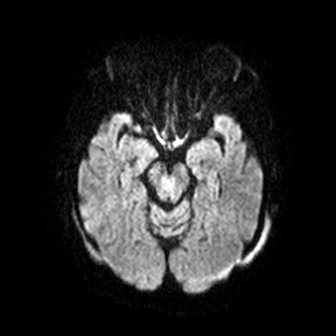
[im 32/56]
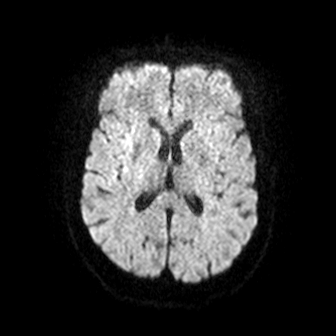
[im 40/56]
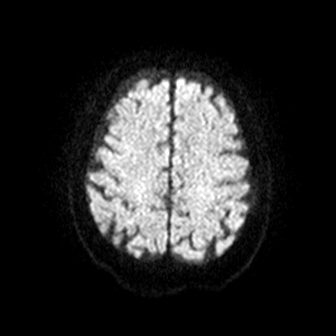
[im 48/56]
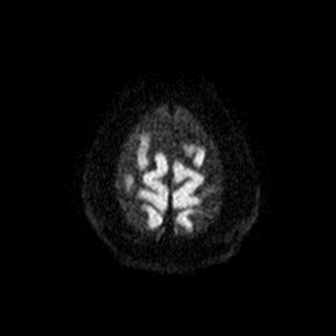
[im 56/56]
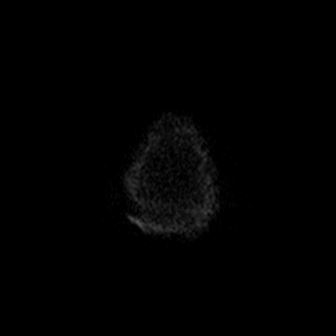

[Series 6: ax dwi_adc · axial · 3.0mm · 0.71mm/px · z∈[-101,+63]mm · 7 of 56 slices shown]
[im 1/56]
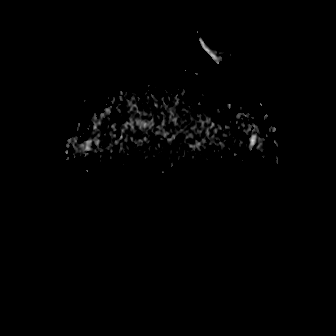
[im 10/56]
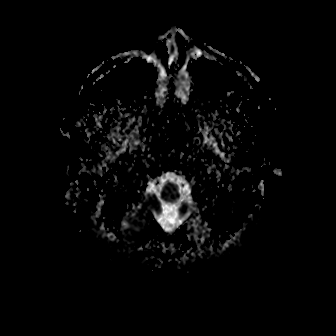
[im 19/56]
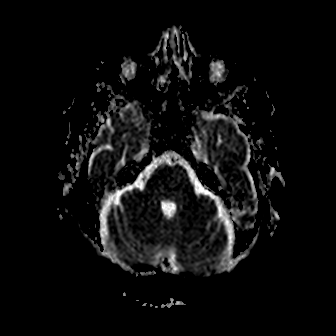
[im 28/56]
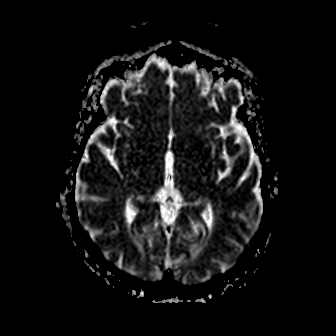
[im 37/56]
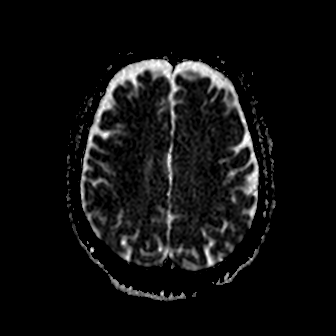
[im 46/56]
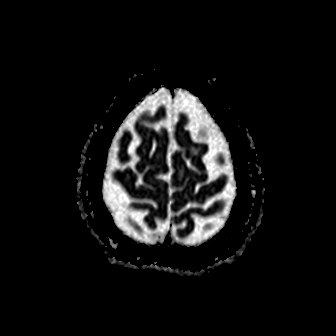
[im 56/56]
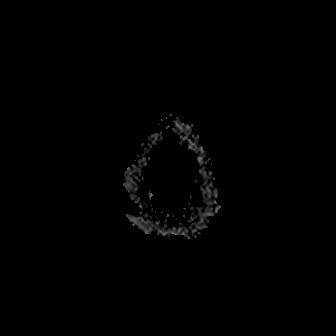

[Series 7: cor dwi_tracew · coronal · 5.0mm · 0.68mm/px · 4 of 36 slices shown]
[im 1/36]
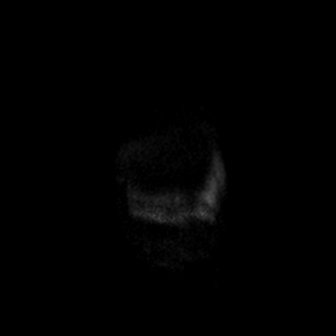
[im 12/36]
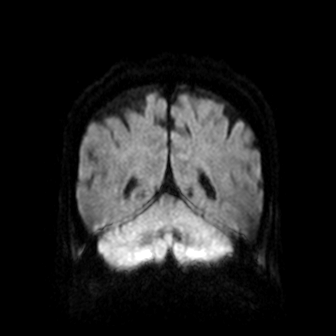
[im 24/36]
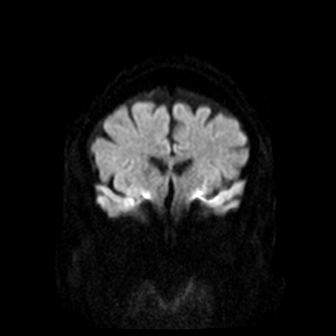
[im 36/36]
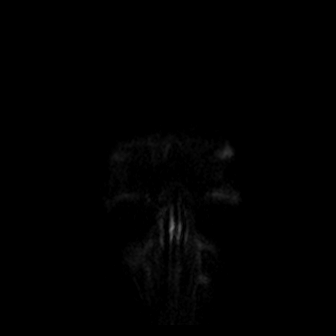

[Series 8: cor dwi_adc · coronal · 5.0mm · 0.68mm/px · 4 of 36 slices shown]
[im 1/36]
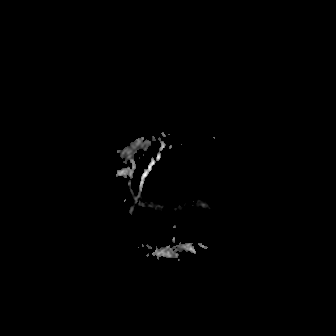
[im 12/36]
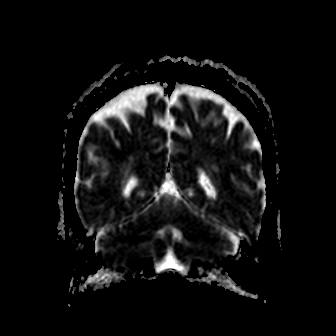
[im 24/36]
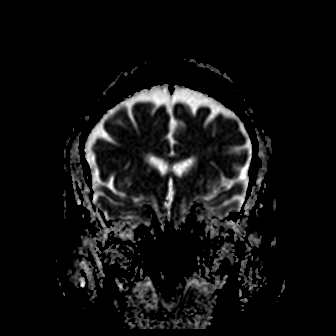
[im 36/36]
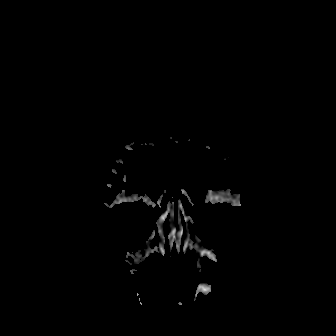

[Series 9: FLAIR · axial · 3.0mm · 0.69mm/px · z∈[-100,+60]mm · 7 of 55 slices shown]
[im 1/55]
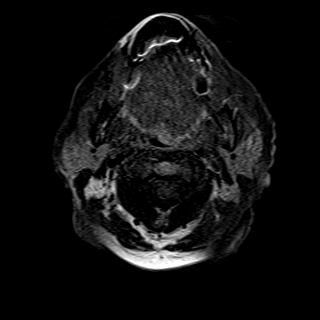
[im 10/55]
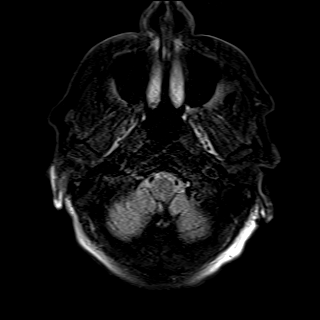
[im 19/55]
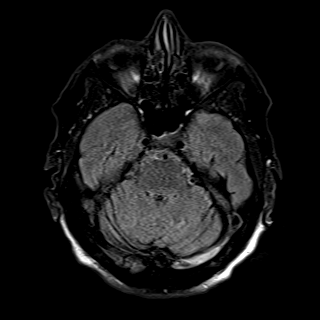
[im 28/55]
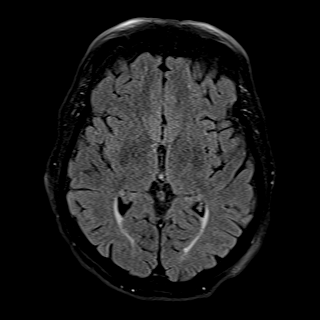
[im 37/55]
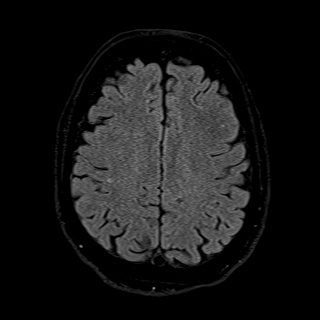
[im 46/55]
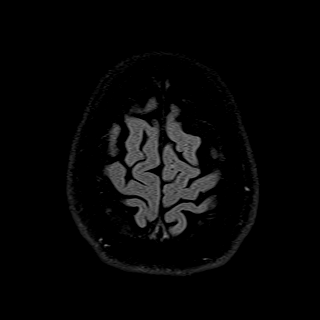
[im 55/55]
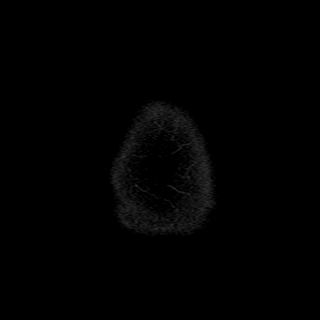

[Series 10: mag_images · axial · 3.0mm · 0.90mm/px · z∈[-95,+56]mm · 6 of 52 slices shown]
[im 1/52]
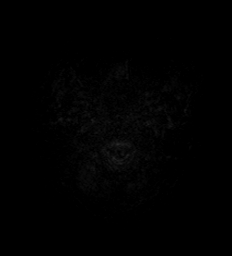
[im 11/52]
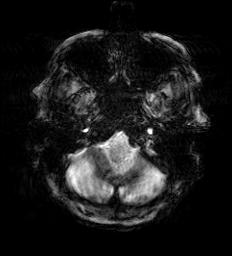
[im 21/52]
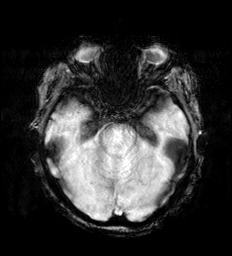
[im 31/52]
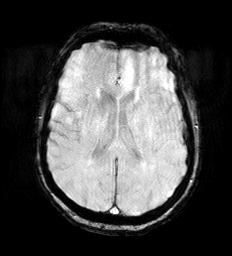
[im 41/52]
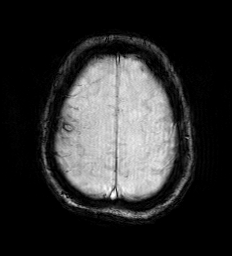
[im 52/52]
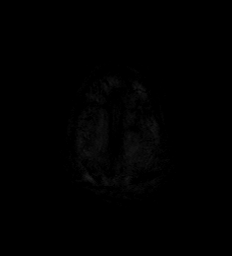

[Series 11: pha_images · axial · 3.0mm · 0.90mm/px · z∈[-95,+56]mm · 6 of 52 slices shown]
[im 1/52]
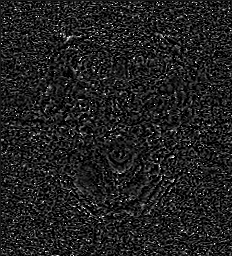
[im 11/52]
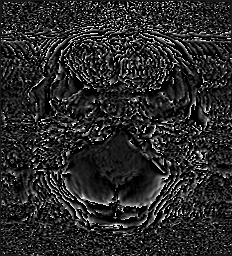
[im 21/52]
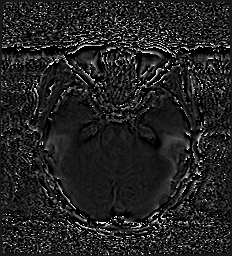
[im 31/52]
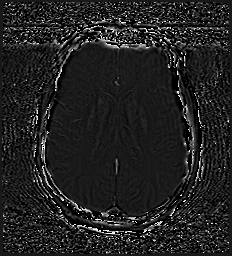
[im 41/52]
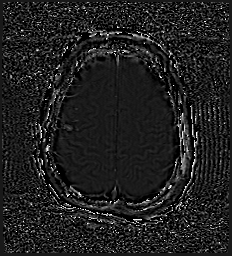
[im 52/52]
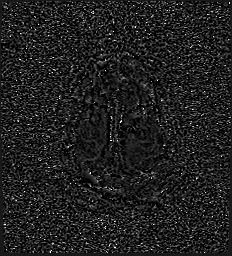

[Series 12: swi_images · axial · 3.0mm · 0.90mm/px · z∈[-95,+56]mm · 6 of 52 slices shown]
[im 1/52]
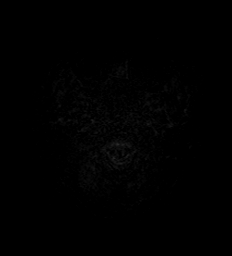
[im 11/52]
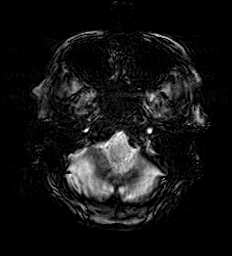
[im 21/52]
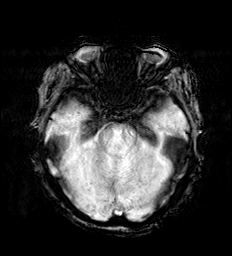
[im 31/52]
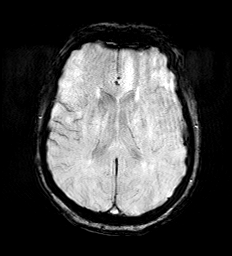
[im 41/52]
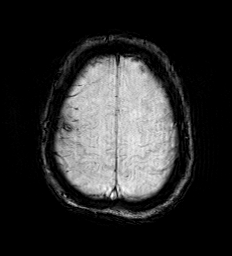
[im 52/52]
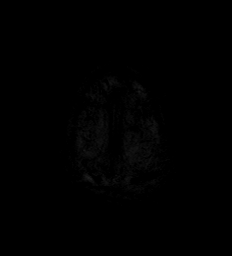

[48 of 48 positions shown; findings below may reference images not displayed]

FINDINGS: DWI, axial T2 FLAIR, and SWI sequences were obtained as part of a
stroke protocol

There is no definite acute infarction. Foci of susceptibility
hypointensity are present in the posterior right frontal and
posterior left temporal lobes likely reflecting chronic blood
products. Patchy foci of T2 hyperintensity in the supratentorial
white matter are nonspecific but may reflect mild chronic
microvascular ischemic changes.
IMPRESSION: No definite acute infarction.

Mild chronic microvascular ischemic changes.

Reviewed with [REDACTED] at the time of imaging completion.

## 2021-02-26 IMAGING — DX DG ABDOMEN 1V
1 series · 1 of 1 positions shown · non-contrast
Comparison: Ultrasound [DATE]

CLINICAL DATA: Weakness slurred speech

EXAM:
ABDOMEN - 1 VIEW

[abdomen supine]
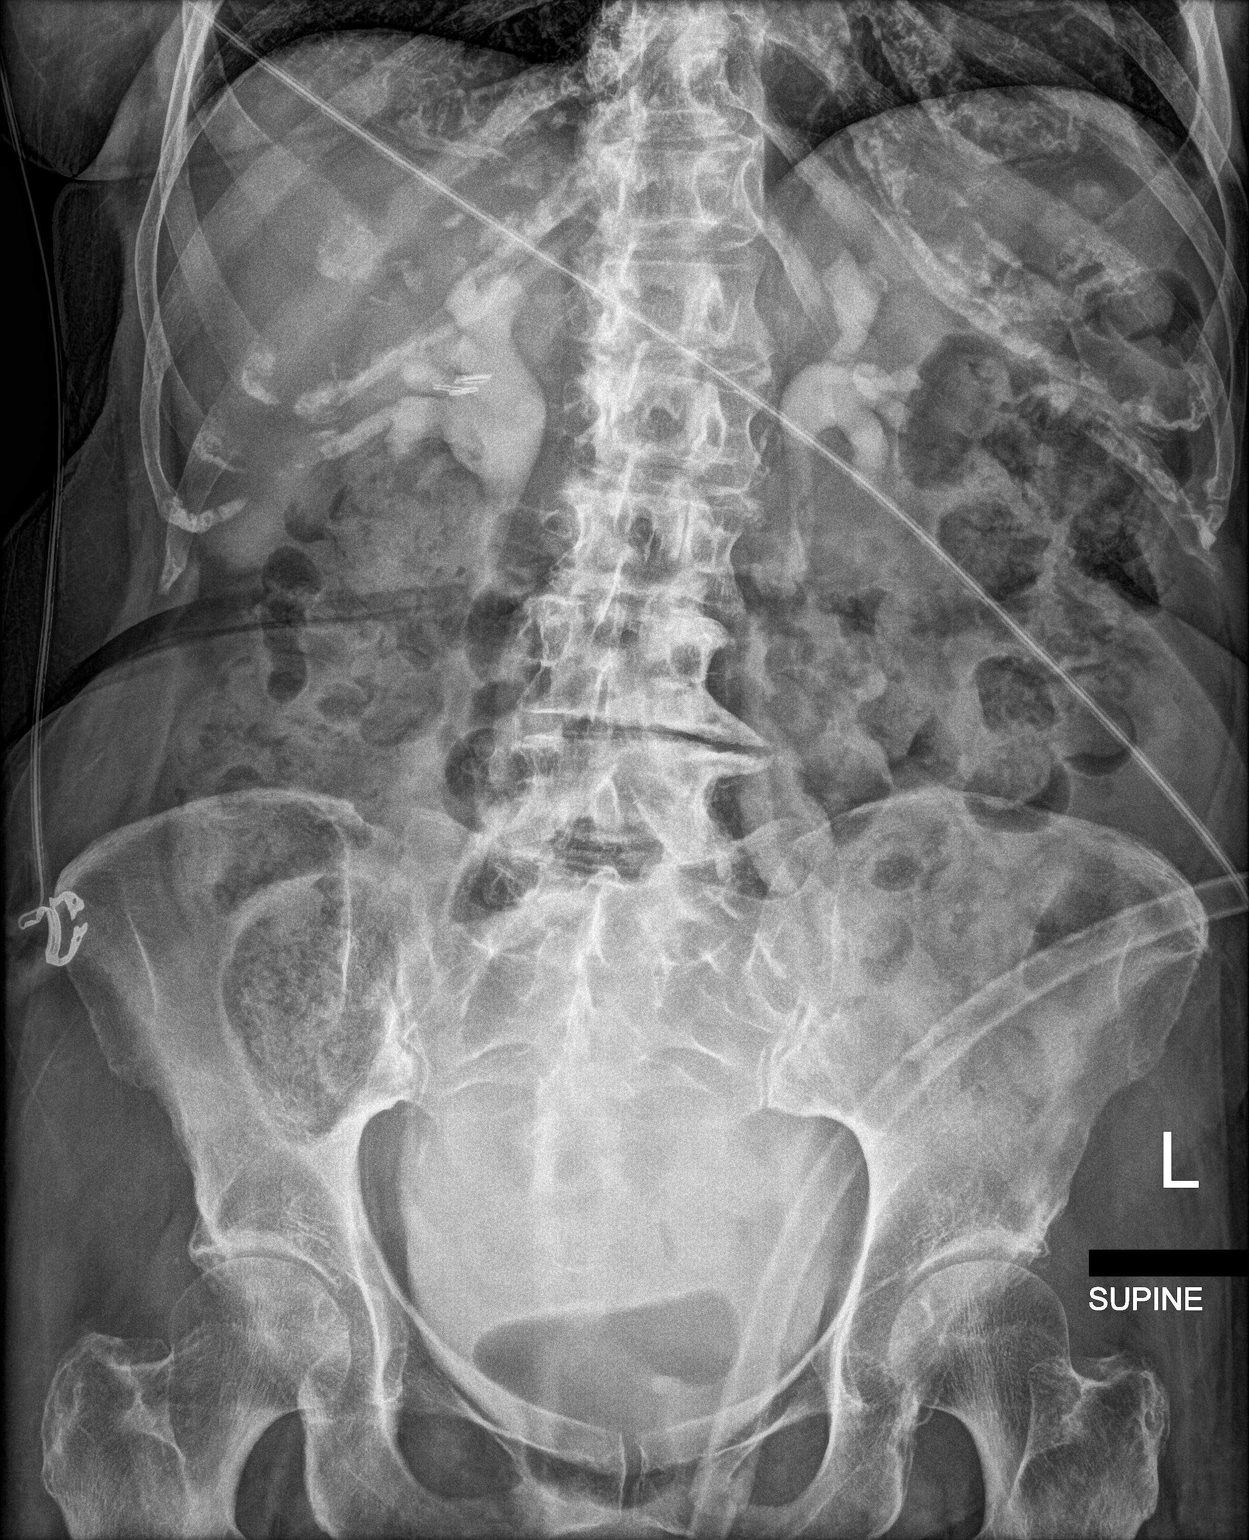

[1 of 1 positions shown; findings below may reference images not displayed]

FINDINGS: Nonobstructed gas pattern with moderate stool. The urinary bladder
is distended with faint contrast. Contrast within the collecting
systems with mild right greater than left hydronephrosis and
hydroureter.
IMPRESSION: 1. Nonobstructed gas pattern
2. Mild right greater than left hydronephrosis and hydroureter based
on excreted contrast within the bladder and collecting systems.
There is distension of the urinary bladder.

## 2021-02-26 IMAGING — MR MR MRA NECK W/O CM
1 series · 19 of 48 positions shown · non-contrast
Comparison: None.

CLINICAL DATA: Neuro deficit, acute, stroke suspected

EXAM:
MRA HEAD WITHOUT CONTRAST
MRA NECK WITHOUT CONTRAST
TECHNIQUE: Multiplanar, multiecho pulse sequences of the brain and surrounding
structures were obtained without intravenous contrast. Angiographic
images of the Circle of Willis were obtained using MRA technique
without intravenous contrast. Angiographic images of the neck were
obtained using MRA technique without intravenous contrast. Carotid
stenosis measurements (when applicable) are obtained utilizing
NASCET criteria, using the distal internal carotid diameter as the
denominator.

[Series 13: TOF · axial · 0.5mm · 0.41mm/px · z∈[-112,-15]mm · 19 of 205 slices shown]
[im 1/205]
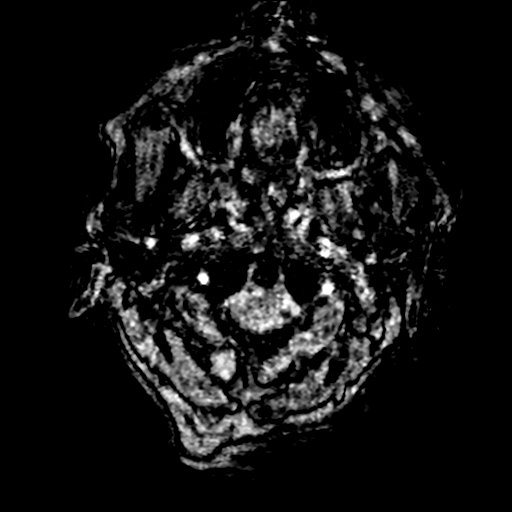
[im 5/205]
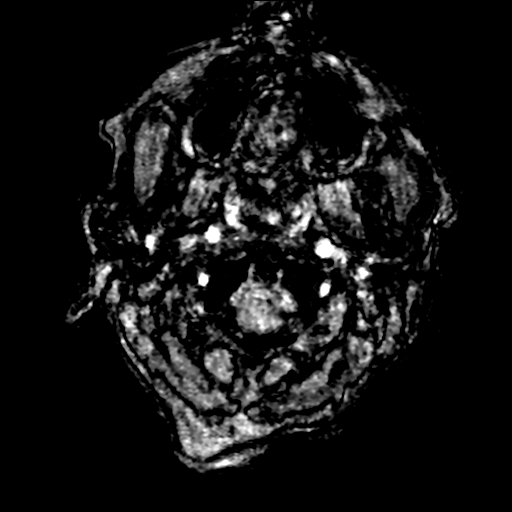
[im 9/205]
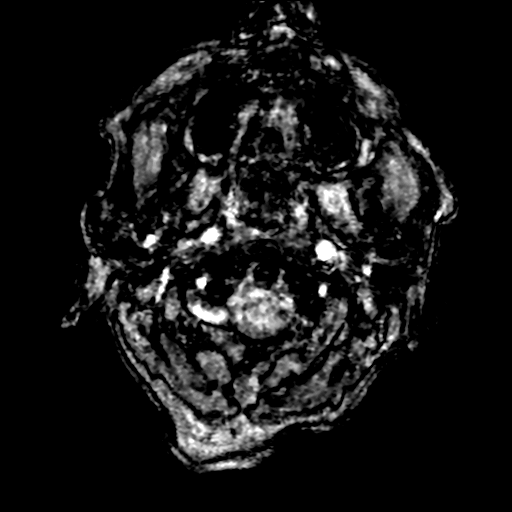
[im 14/205]
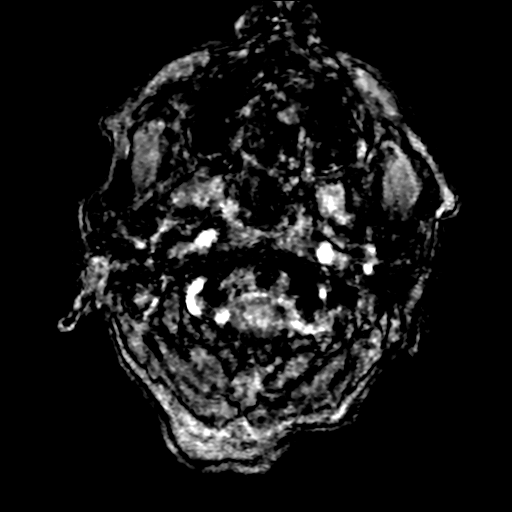
[im 18/205]
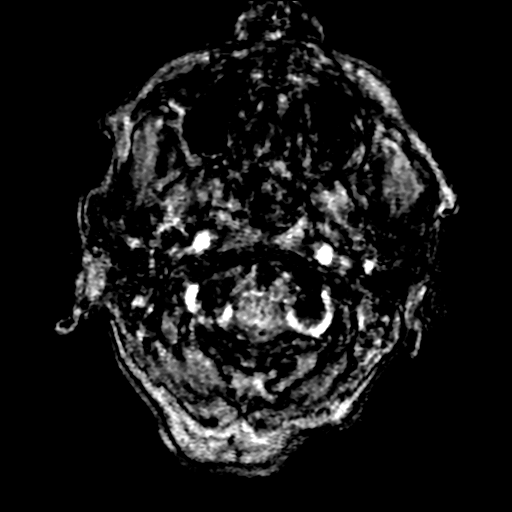
[im 22/205]
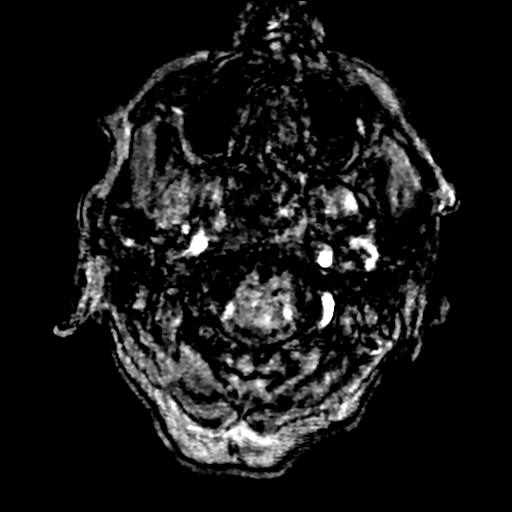
[im 27/205]
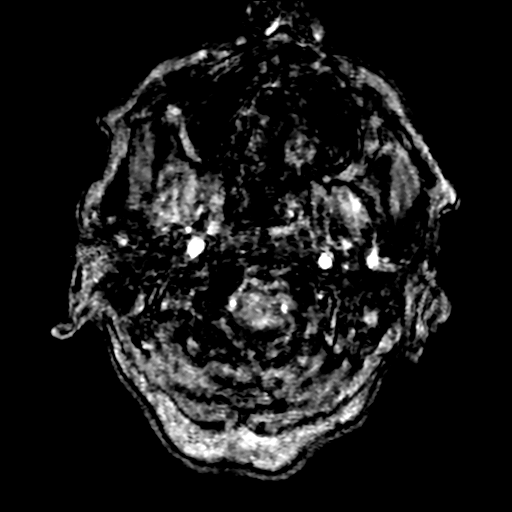
[im 31/205]
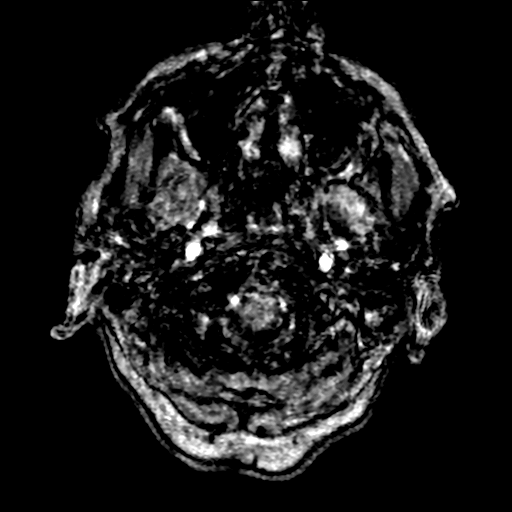
[im 35/205]
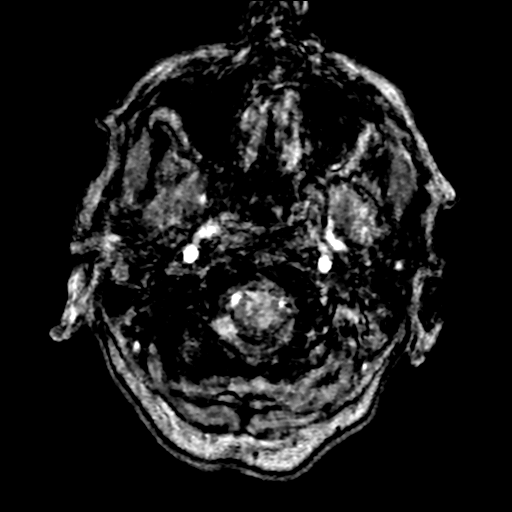
[im 40/205]
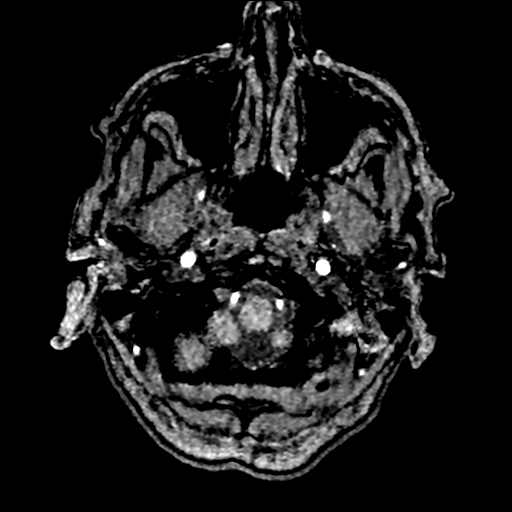
[im 44/205]
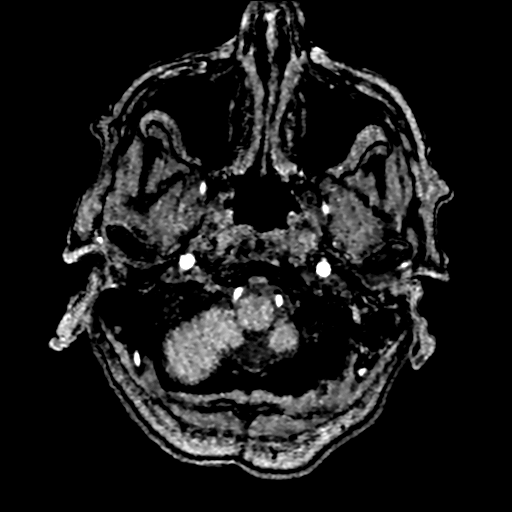
[im 66/205]
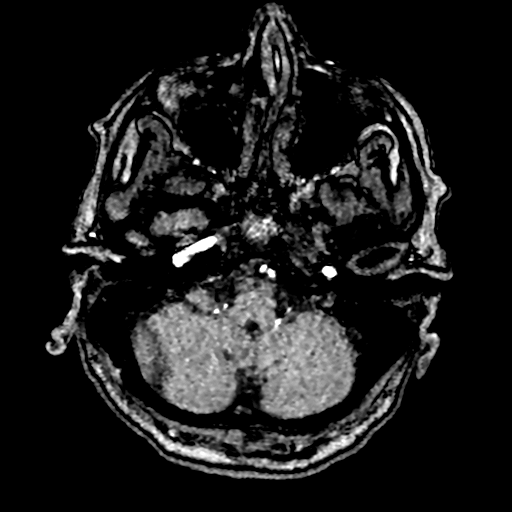
[im 92/205]
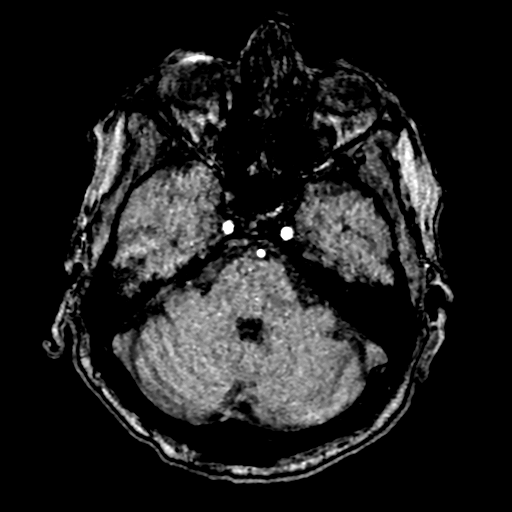
[im 105/205]
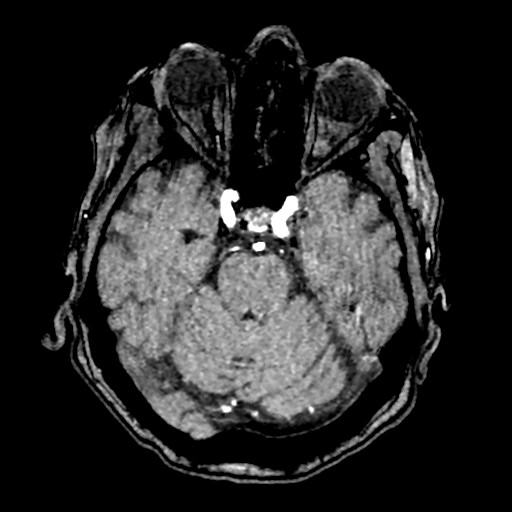
[im 118/205]
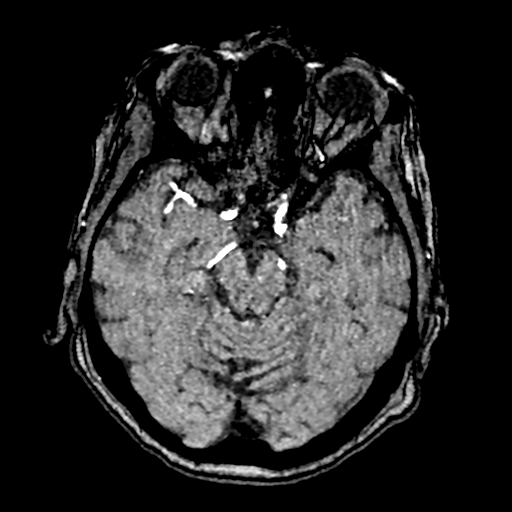
[im 144/205]
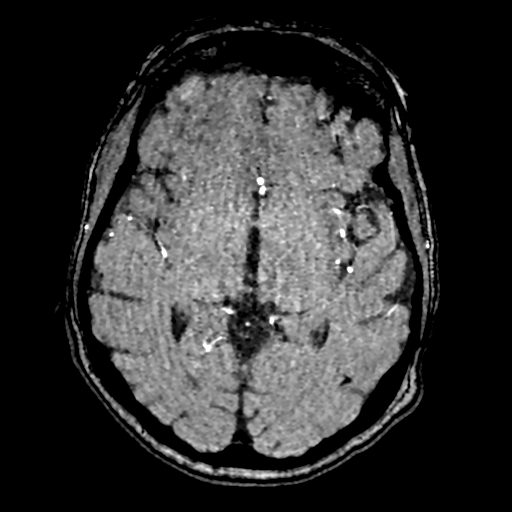
[im 170/205]
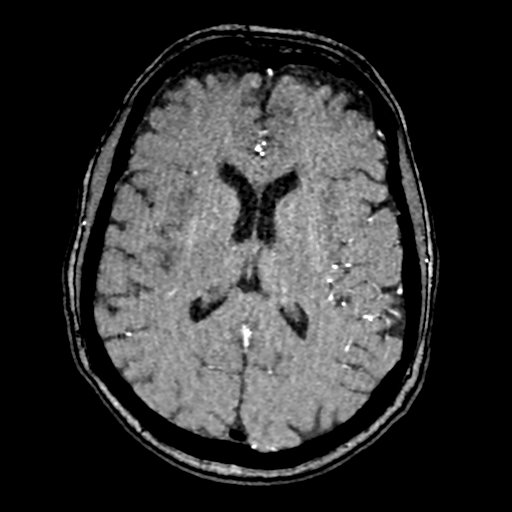
[im 174/205]
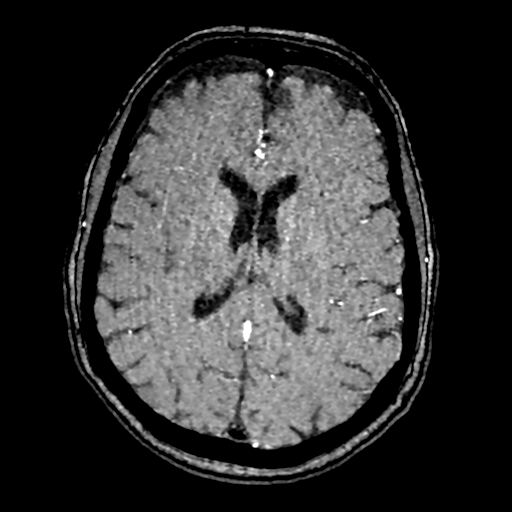
[im 196/205]
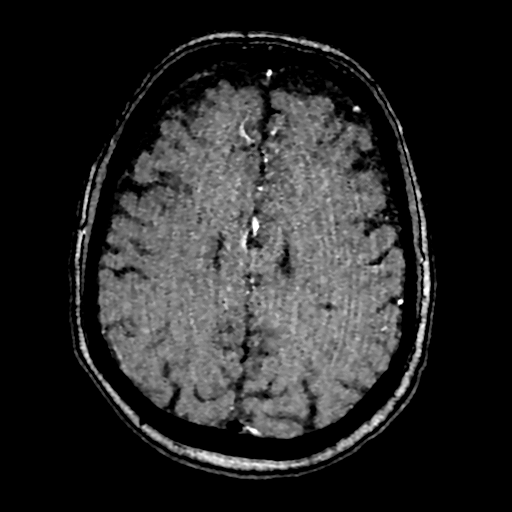

[19 of 48 positions shown; findings below may reference images not displayed]

FINDINGS: MRA HEAD FINDINGS

Intracranial internal carotid arteries are patent. Question of
high-grade stenosis or occlusion of proximal right M2 MCA. Anterior
and middle cerebral arteries are otherwise patent. Intracranial
vertebral arteries are patent. Basilar artery is patent. Posterior
cerebral arteries are patent. Fetal origin of the left PCA.

MRA NECK FINDINGS

Motion artifact is present. Great vessel origins and vertebral
origins are poorly evaluated. The visualized common carotids are
patent. Internal and external carotid arteries are patent.
Visualized extracranial vertebral arteries appear patent.
IMPRESSION: Question high-grade stenosis or occlusion of proximal right M2 MCA.

No occlusion or hemodynamically significant stenosis the neck.

These results were called by telephone at the time of interpretation
on [DATE] at [DATE] to provider [REDACTED] , who verbally
acknowledged these results.

## 2021-02-26 IMAGING — CT CT HEAD CODE STROKE
4 series · 16 of 47 positions shown, 18 images · non-contrast
Comparison: None.

CLINICAL DATA: Code stroke.  Left-sided weakness

EXAM:
CT HEAD WITHOUT CONTRAST
TECHNIQUE: Contiguous axial images were obtained from the base of the skull
through the vertex without intravenous contrast.

[Series 3: head bone · axial · 0.43mm/px · z∈[-113,-83]mm · 3 of 75 slices shown]
[im 8/75  bone]
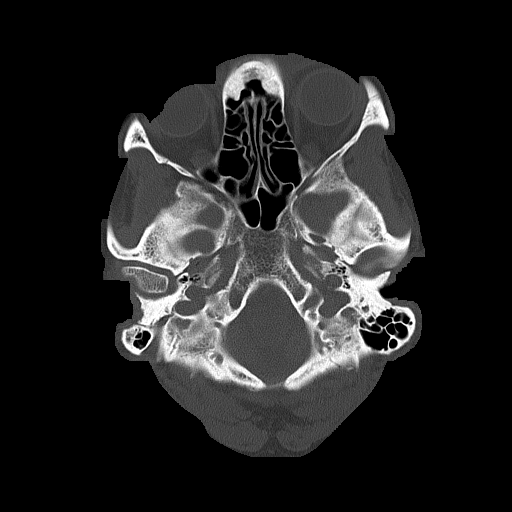
[im 15/75  bone]
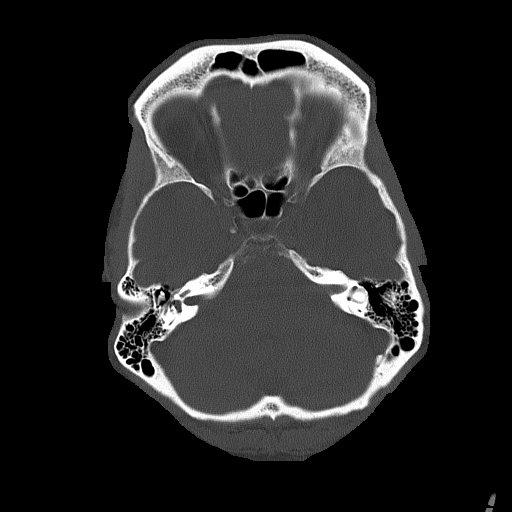
[im 23/75  bone]
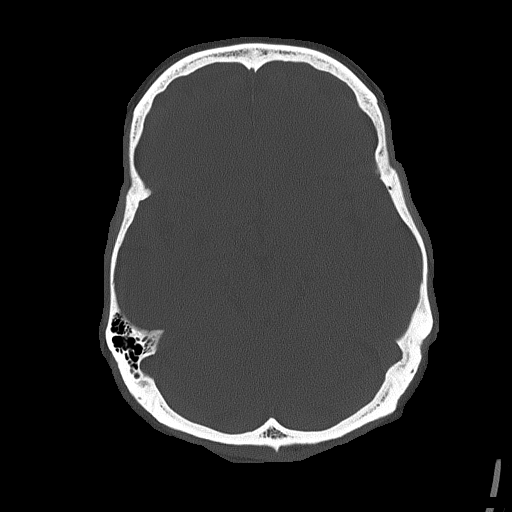

[Series 4: head wo · axial · 0.43mm/px · z∈[-112,-2]mm · 7 of 30 slices shown, 9 images]
[im 4/30  brain]
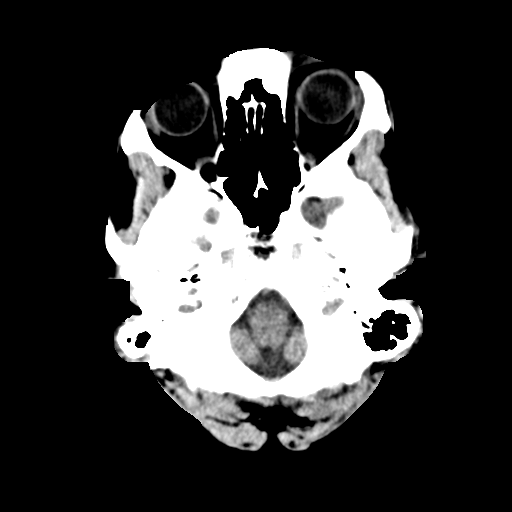
[im 4/30  bone]
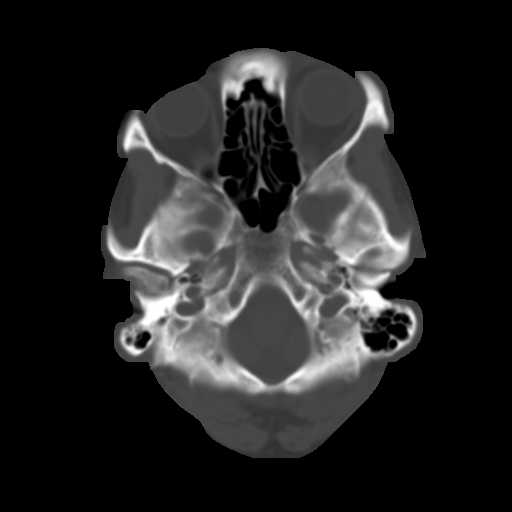
[im 8/30  brain]
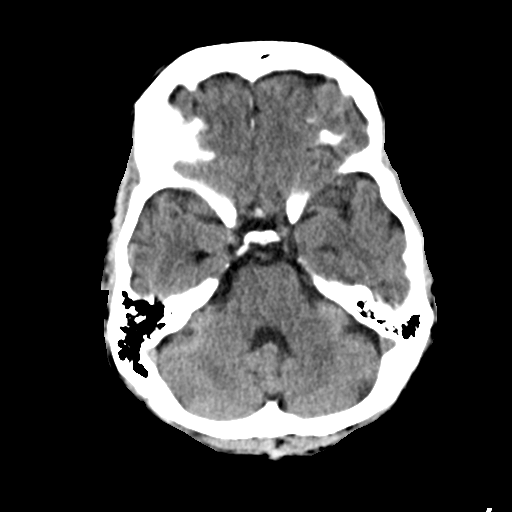
[im 11/30  brain]
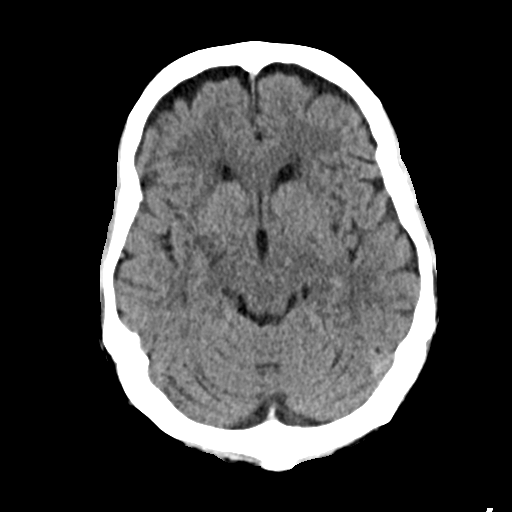
[im 15/30  brain]
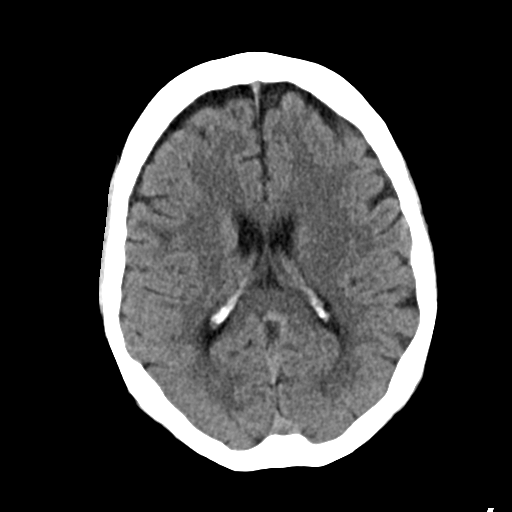
[im 19/30  brain]
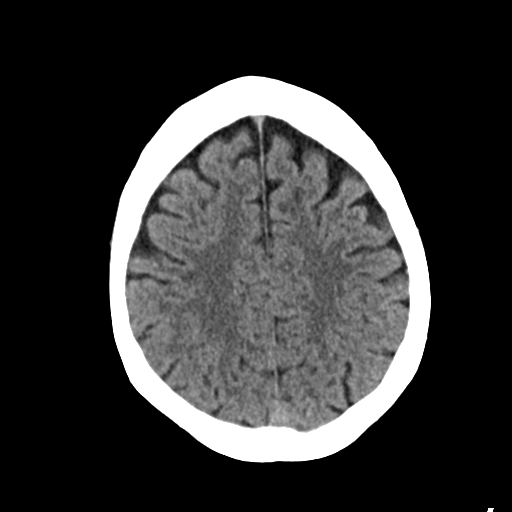
[im 19/30  bone]
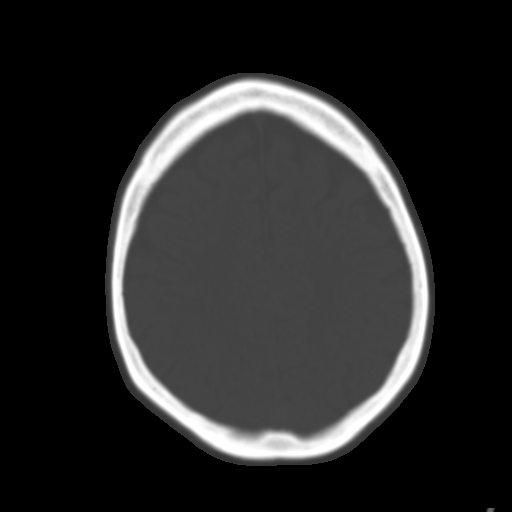
[im 22/30  brain]
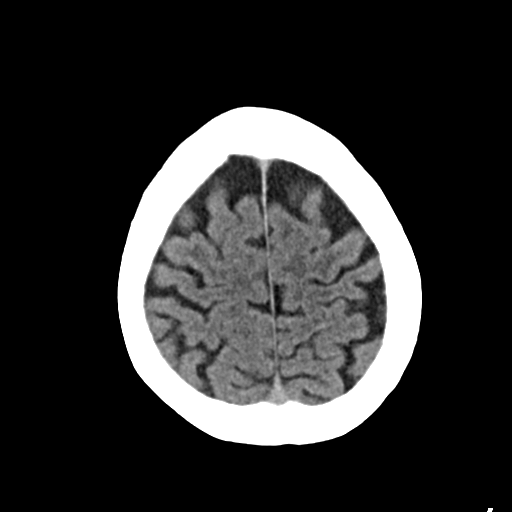
[im 26/30  brain]
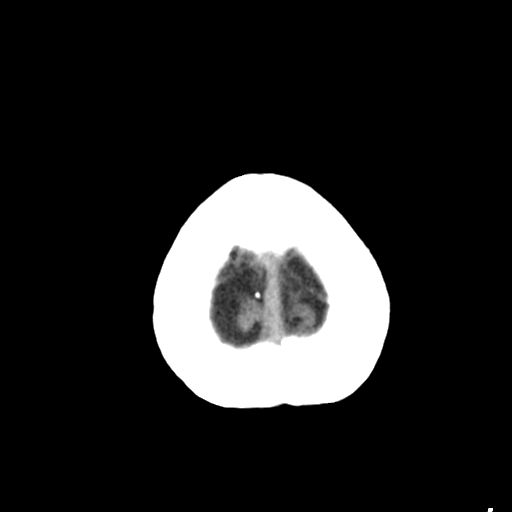

[Series 5: coronal soft tissue · coronal · 0.34mm/px · 3 of 65 slices shown]
[im 22/65  brain]
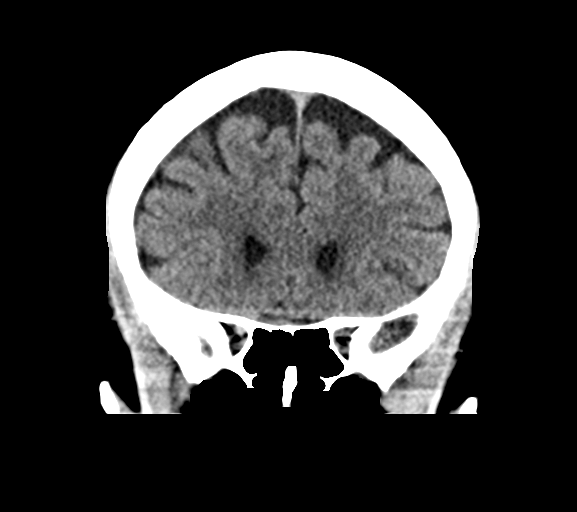
[im 29/65  brain]
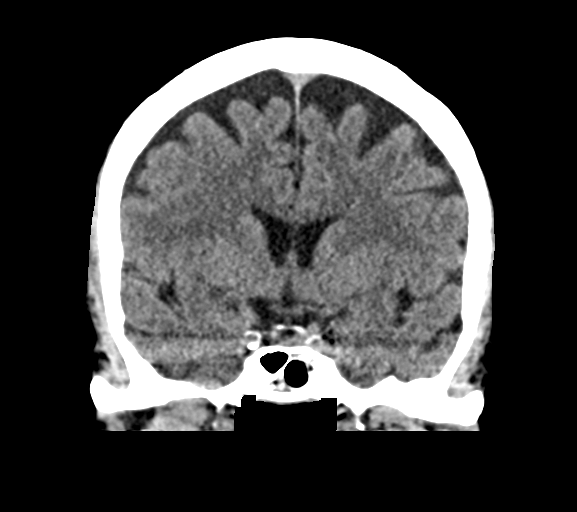
[im 36/65  brain]
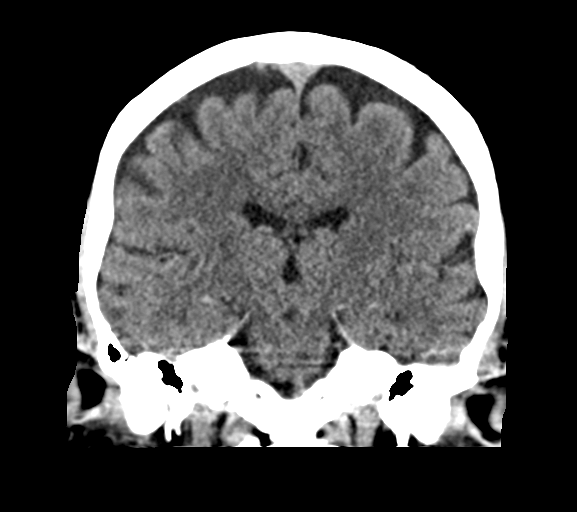

[Series 6: sagittal soft tissue · sagittal · 0.34mm/px · 3 of 54 slices shown]
[im 18/54  brain]
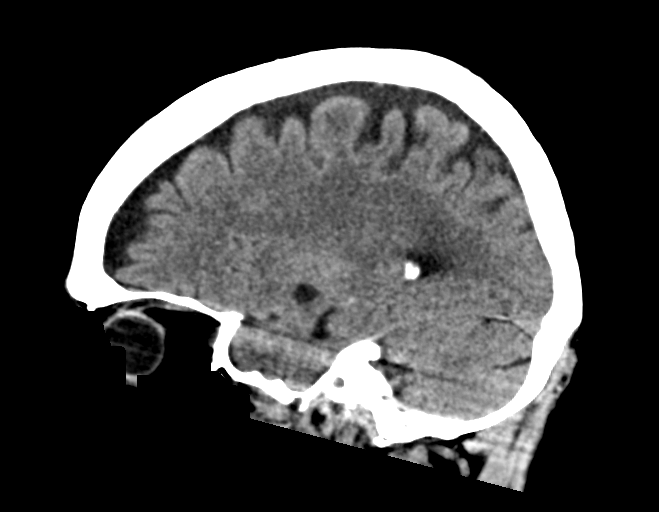
[im 27/54  brain]
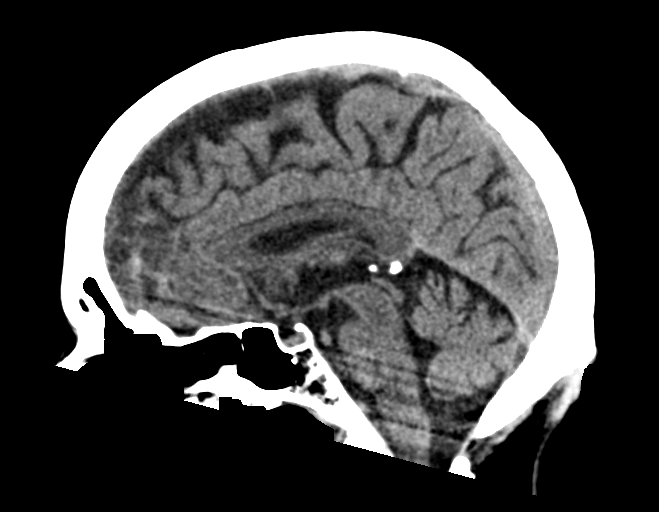
[im 36/54  brain]
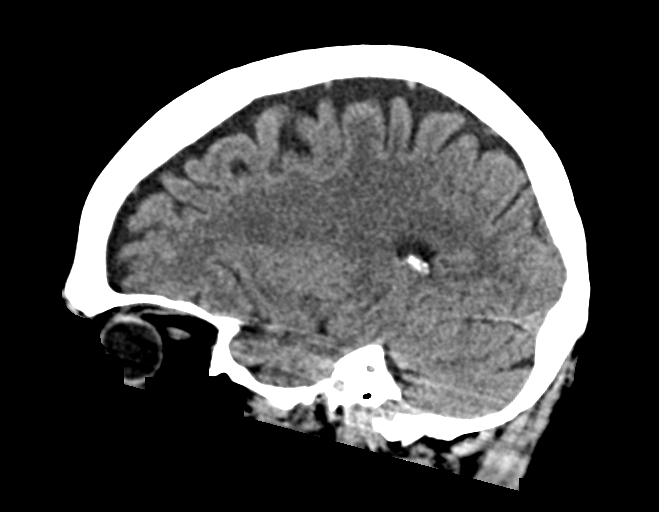

[16 of 47 positions shown; findings below may reference images not displayed]

FINDINGS: Brain: There is no acute intracranial hemorrhage, mass effect, or
edema. Gray-white differentiation is preserved. Patchy low-density
in the supratentorial white matter is nonspecific but may reflect
mild chronic microvascular ischemic changes. Probable prominent
perivascular space at the level of the inferior right basal ganglia.
Ventricles and sulci are within normal limits in size and
configuration. No extra-axial collection.

Vascular: No hyperdense vessel.

Skull: Unremarkable.

Sinuses/Orbits: No acute abnormality.

Other: Mastoid air cells are clear.

ASPECTS (Alberta Stroke Program Early CT Score)

- Ganglionic level infarction (caudate, lentiform nuclei, internal
capsule, insula, M1-M3 cortex): 7

- Supraganglionic infarction (M4-M6 cortex): 3

Total score (0-10 with 10 being normal): 10
IMPRESSION: There is no acute intracranial hemorrhage or evidence of acute
infarction. ASPECT score is 10.

These results were communicated to [REDACTED] at [DATE] on [DATE]
by text page via the [employer] messaging system.

## 2021-02-26 IMAGING — MR MR MRA NECK W/O CM
1 of 2 series · 1 of 48 positions shown · non-contrast
Comparison: None.

CLINICAL DATA: Neuro deficit, acute, stroke suspected

EXAM:
MRA HEAD WITHOUT CONTRAST
MRA NECK WITHOUT CONTRAST
TECHNIQUE: Multiplanar, multiecho pulse sequences of the brain and surrounding
structures were obtained without intravenous contrast. Angiographic
images of the Circle of Willis were obtained using MRA technique
without intravenous contrast. Angiographic images of the neck were
obtained using MRA technique without intravenous contrast. Carotid
stenosis measurements (when applicable) are obtained utilizing
NASCET criteria, using the distal internal carotid diameter as the
denominator.

[Series 1196: lcca · coronal · 0.5mm · 0.43mm/px · 1 of 5 slices shown]
[im 1/5]
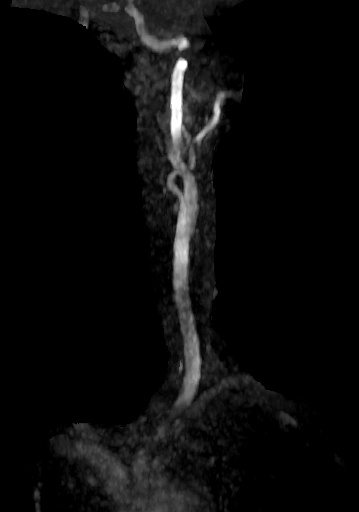

[1 of 48 positions shown; findings below may reference images not displayed]

FINDINGS: MRA HEAD FINDINGS

Intracranial internal carotid arteries are patent. Question of
high-grade stenosis or occlusion of proximal right M2 MCA. Anterior
and middle cerebral arteries are otherwise patent. Intracranial
vertebral arteries are patent. Basilar artery is patent. Posterior
cerebral arteries are patent. Fetal origin of the left PCA.

MRA NECK FINDINGS

Motion artifact is present. Great vessel origins and vertebral
origins are poorly evaluated. The visualized common carotids are
patent. Internal and external carotid arteries are patent.
Visualized extracranial vertebral arteries appear patent.
IMPRESSION: Question high-grade stenosis or occlusion of proximal right M2 MCA.

No occlusion or hemodynamically significant stenosis the neck.

These results were called by telephone at the time of interpretation
on [DATE] at [DATE] to provider [REDACTED] , who verbally
acknowledged these results.

## 2021-02-26 MED ORDER — CLOPIDOGREL BISULFATE 75 MG PO TABS
75.0000 mg | ORAL_TABLET | Freq: Every day | ORAL | Status: DC
  Administered 2021-03-01: 75 mg via ORAL
  Filled 2021-02-26: qty 1

## 2021-02-26 MED ORDER — IOHEXOL 350 MG/ML SOLN
100.0000 mL | Freq: Once | INTRAVENOUS | Status: AC | PRN
  Administered 2021-02-26: 100 mL via INTRAVENOUS

## 2021-02-26 MED ORDER — PANTOPRAZOLE SODIUM 40 MG IV SOLR
40.0000 mg | INTRAVENOUS | Status: DC
  Administered 2021-02-26 – 2021-02-28 (×3): 40 mg via INTRAVENOUS
  Filled 2021-02-26 (×3): qty 40

## 2021-02-26 MED ORDER — SODIUM CHLORIDE 0.9 % IV SOLN: INTRAVENOUS | Status: DC

## 2021-02-26 MED ORDER — STROKE: EARLY STAGES OF RECOVERY BOOK: Freq: Once | Status: DC

## 2021-02-26 MED ORDER — ACETAMINOPHEN 650 MG RE SUPP: 650.0000 mg | RECTAL | Status: DC | PRN

## 2021-02-26 MED ORDER — LEVOTHYROXINE SODIUM 100 MCG PO TABS
100.0000 ug | ORAL_TABLET | Freq: Every day | ORAL | Status: DC
Start: 2021-02-26 — End: 2021-03-01
  Administered 2021-03-01: 100 ug via ORAL
  Filled 2021-02-26: qty 1

## 2021-02-26 MED ORDER — ACETAMINOPHEN 325 MG PO TABS
650.0000 mg | ORAL_TABLET | ORAL | Status: DC | PRN
  Filled 2021-02-26: qty 2

## 2021-02-26 MED ORDER — ACETAMINOPHEN 160 MG/5ML PO SOLN
650.0000 mg | ORAL | Status: DC | PRN
  Filled 2021-02-26: qty 20.3

## 2021-02-26 MED ORDER — SENNOSIDES-DOCUSATE SODIUM 8.6-50 MG PO TABS: 1.0000 | ORAL_TABLET | Freq: Every evening | ORAL | Status: DC | PRN

## 2021-02-26 MED ORDER — ASPIRIN 81 MG PO CHEW
81.0000 mg | CHEWABLE_TABLET | Freq: Every day | ORAL | Status: DC
  Administered 2021-03-01: 81 mg via ORAL
  Filled 2021-02-26 (×2): qty 1

## 2021-02-26 MED ORDER — ENOXAPARIN SODIUM 40 MG/0.4ML IJ SOSY
40.0000 mg | PREFILLED_SYRINGE | INTRAMUSCULAR | Status: DC
  Administered 2021-02-26 – 2021-02-28 (×3): 40 mg via SUBCUTANEOUS
  Filled 2021-02-26 (×3): qty 0.4

## 2021-02-26 MED ORDER — IOHEXOL 350 MG/ML SOLN: 75.0000 mL | Freq: Once | INTRAVENOUS | Status: DC | PRN
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
## 2021-02-26 NOTE — ED Notes (Signed)
Pt to CT scan.

## 2021-02-26 NOTE — ED Notes (Signed)
Notified provider of fail for stroke swallow screen due to choking with water

## 2021-02-26 NOTE — Progress Notes (Signed)
OT Cancellation Note  Patient Details Name: Drisana Schweickert MRN: [id number] DOB: 11/28/1940   Cancelled Treatment:    Reason Eval/Treat Not Completed: Patient at procedure or test/ unavailable  Thank you for OT consult.  Attempted to engage pt in OT evaluation, but pt currently out of room for MRI.  Will continue to follow up as pt is available.  Jeneen Montgomery, OTR/L 02/26/21, 2:45 PM

## 2021-02-26 NOTE — Progress Notes (Signed)
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 Patient is not a candidate for intervention.  Su Monks, MD [facility] [phone]  If 7pm- 7am, please page neurology on call as listed in [facility].
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
## 2021-02-26 NOTE — ED Notes (Signed)
Button pressed on for neuro-cart.

## 2021-02-26 NOTE — ED Notes (Signed)
Pt c/o pressure in abdomen and feeling of abdomen being "bloated and rigid." Patient tender upon palpation. Pain reported as 10/10. Reports started mid afternoon but has gotten worse. Reports normal BM this am. No nausea, vomiting, diarrhea, burping or flatulence reported here. MD notified regarding change in patient.

## 2021-02-26 NOTE — ED Notes (Addendum)
Attending and neurologist notified via secure chat and via telephone with Dr. Quinn Axe of new development that pt cannot feel left arm at all now in addition to the left sided facial weakness and inability to lift left arm or leg. Neurologist aware pt has not been able to follow fingers with eyes to the left also.

## 2021-02-26 NOTE — Significant Event (Signed)
Discussed with neurology.  Despite negative MRI, followup MRA demonstrated possible R M2 occlusion.  Followup CTA showed complete M2 infarct.  Neurology to inform family of diagnosis.  No immediate change in management.  Patient can go to medical telemetry floor  Ralene Muskrat MD

## 2021-02-26 NOTE — Care Management (Addendum)
Notified by bedside RN of patient report of 10/10 abd pain.  Unclear etiology.  Vitals stable except permissive HTN.  Patient had BM today.  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

## 2021-02-26 NOTE — ED Triage Notes (Signed)
Pt in with co left sided weakness and slurred speech that started at 0820 this am, pt does not have a of stroke. Pt with awake and alert does have slurred speech and significant weakness to left arm. Friend also states she has had trouble walking since onset of symptoms.

## 2021-02-26 NOTE — Progress Notes (Signed)
CODE STROKE- PHARMACY COMMUNICATION   Time CODE STROKE called/page received: 0850  Time response to CODE STROKE was made (in person or via phone): Immediately  Time Stroke Kit retrieved from [place] (only if needed): 0930. TNK was mixed. Patient ultimately decided to forego thrombolytics after risk / benefit discussion in preference of continued monitoring  Name of Provider contacted: Dr. Rex Kras 02/26/2021  10:27 AM

## 2021-02-26 NOTE — ED Notes (Signed)
[id number], pt rolled by EDP and headed to CT. All information given to [employer] on screen.

## 2021-02-26 NOTE — Progress Notes (Signed)
Chaplain Maggie made initial visit with patient, husband, and neighbor at bedside. Social support and prayer were central to this visit and appreciated by all. Continued support available per on call chaplain.
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
## 2021-02-26 NOTE — Progress Notes (Signed)
SLP Cancellation Note  Patient Details Name: Teresa [name] MRN: [id number] DOB: 21-Jun-1940   Cancelled treatment:       Reason Eval/Treat Not Completed: Patient at procedure or test/unavailable. Pt OTF for STAT MRA. SLP to continue efforts, as appropriate.    Cherrie Gauze, M.S., [facility] [phone] ([place])  Quintella Baton 02/26/2021, 1:18 PM

## 2021-02-26 NOTE — ED Provider Notes (Signed)
[facility] Emergency Department Provider Note   ____________________________________________   Event Date/Time   First MD Initiated Contact with Patient 02/26/21 [phone]     (approximate)  I have reviewed the triage vital signs and the nursing notes.   HISTORY  Chief Complaint Weakness, Slurred speech    HPI Teresa [name] is a 80 y.o. female with past medical history of hypothyroidism and skin cancer presents to the ED complaining of slurred speech and weakness.  Majority of history is obtained from patient's neighbor due to patient's slurred speech.  Neighbor states that the patient had come over to her house as usual for their morning walk, when she began to notice that patient speech was slurred.  Patient states that she was feeling fine when she had left her home around 820 this morning.  As time went on, neighbor began to notice worsening weakness in patient's left arm, however patient was walking normally at that time.  Patient also reports that she developed numbness along the right side of her face.  The neighbor drove the patient to the ED and she was able to ambulate into the building, however developed a limp in her left leg.  Patient does not take any blood thinners and has not had any recent trauma to her head or neck area.  She has been feeling well recently with no fevers, cough, chest pain, or shortness of breath.        Past Medical History:  Diagnosis Date   Cancer [facility])    skin   Hypothyroidism    Raynaud's disease     Patient Active Problem List   Diagnosis Date Noted   History of nonmelanoma skin cancer 05/23/2014    Past Surgical History:  Procedure Laterality Date   ABDOMINAL HYSTERECTOMY  1991   APPENDECTOMY  1991   BACK SURGERY  2010   Cervical fusion C4-5-6-7   CATARACT EXTRACTION, BILATERAL Bilateral 10/2016   CHOLECYSTECTOMY  1995   COLONOSCOPY WITH PROPOFOL N/A 12/08/2018   Procedure: COLONOSCOPY WITH PROPOFOL;   Surgeon: Toledo, Benay Pike, MD;  Location: [facility] ENDOSCOPY;  Service: Gastroenterology;  Laterality: N/A;   EYE SURGERY      Prior to Admission medications   Medication Sig Start Date End Date Taking? Authorizing Provider  cephALEXin (KEFLEX) 500 MG capsule Take 2 capsules (1,000 mg total) by mouth 2 (two) times daily. Patient not taking: Reported on 12/08/2018 12/07/18   Cuthriell, Charline Bills, PA-C  doxycycline (MONODOX) 100 MG capsule  04/16/17   [provider]  gabapentin (NEURONTIN) 300 MG capsule TAKE 1 CAPSULE EVERY NIGHT 12/26/16   [provider]  Ivermectin 1 % CREA apply ON THE SKIN daily Patient not taking: Reported on 01/18/2021 04/12/17   [provider]  levothyroxine (SYNTHROID, LEVOTHROID) 75 MCG tablet TAKE 1 TABLET ONCE DAILY ON AN EMPTY STOMACH WITH A GLASS OF WATER AT LEAST 30 TO 60 MINUTES BEFORE BREAKFAST 08/29/17   [provider]  nisoldipine (SULAR) 34 MG 24 hr tablet TAKE 1 TABLET EVERY DAY 03/28/14   [provider]    Allergies Codeine, Penicillins, and Sulfa antibiotics  Family History  Problem Relation Age of Onset   Breast cancer Neg Hx     Social History Social History   Tobacco Use   Smoking status: Never   Smokeless tobacco: Never  Vaping Use   Vaping Use: Never used  Substance Use Topics   Alcohol use: Never   Drug use: Never  Review of Systems  Constitutional: No fever/chills Eyes: No visual changes. ENT: No sore throat. Cardiovascular: Denies chest pain. Respiratory: Denies shortness of breath. Gastrointestinal: No abdominal pain.  No nausea, no vomiting.  No diarrhea.  No constipation. Genitourinary: Negative for dysuria. Musculoskeletal: Negative for back pain. Skin: Negative for rash. Neurological: Negative for headaches, positive for slurred speech and left-sided weakness with right facial numbness.  ____________________________________________   PHYSICAL EXAM:  VITAL SIGNS: ED  Triage Vitals [02/26/21 0847]  Enc Vitals Group     BP      Pulse Rate (!) 56     Resp 20     Temp 97.6 F (36.4 C)     Temp Source Oral     SpO2 98 %     Weight      Height      Head Circumference      Peak Flow      Pain Score      Pain Loc      Pain Edu?      Excl. in [place]?     Constitutional: Alert and oriented. Eyes: Conjunctivae are normal. Head: Atraumatic. Nose: No congestion/rhinnorhea. Mouth/Throat: Mucous membranes are moist. Neck: Normal ROM Cardiovascular: Normal rate, regular rhythm. Grossly normal heart sounds.  2+ radial pulses bilaterally. Respiratory: Normal respiratory effort.  No retractions. Lungs CTAB. Gastrointestinal: Soft and nontender. No distention. Genitourinary: deferred Musculoskeletal: No lower extremity tenderness nor edema. Neurologic: Slurred speech noted.  Left-sided facial droop with left hemiparesis noted.  Subjective decrease sensation over right face, sensation intact over left face. Skin:  Skin is warm, dry and intact. No rash noted. Psychiatric: Mood and affect are normal. Speech and behavior are normal.  ____________________________________________   LABS (all labs ordered are listed, but only abnormal results are displayed)  Labs Reviewed  COMPREHENSIVE METABOLIC PANEL - Abnormal; Notable for the following components:      Result Value   Glucose, Bld 104 (*)    BUN 25 (*)    Creatinine, Ser 1.13 (*)    GFR, Estimated 50 (*)    All other components within normal limits  RESP PANEL BY RT-PCR (FLU A&B, COVID) ARPGX2  ETHANOL  PROTIME-INR  APTT  CBC  DIFFERENTIAL  URINE DRUG SCREEN, QUALITATIVE ([facility] ONLY)  URINALYSIS, ROUTINE W REFLEX MICROSCOPIC  CBG MONITORING, ED   ____________________________________________  EKG  ED ECG REPORT I, Blake Divine, the attending physician, personally viewed and interpreted this ECG.   Date: 02/26/2021  EKG Time: 11:16  Rate: 81  Rhythm: normal sinus rhythm  Axis: LAD   Intervals:none  ST&T Change: None   PROCEDURES  Procedure(s) performed (including Critical Care):  Procedures   ____________________________________________   INITIAL IMPRESSION / ASSESSMENT AND PLAN / ED COURSE      80 year old female with past medical history of hypothyroidism and skin cancer presents to the ED with acute onset left facial droop, slurred speech, and left-sided weakness around 820 this morning.  Code stroke was called on arrival and patient brought back to [place] immediately.  CT head is negative for acute process and patient evaluated by neurology.  Symptoms do not fit obvious distribution for stroke given right-sided facial numbness with left-sided weakness, patient's symptoms also seem to be rapidly fluctuating as on my reassessment patient is moving her left side without difficulty and speaking normally.  Plan was made to hold off on TNK given question of functional exam and patient was taken directly to MRI from CT.  MRI was  negative for acute process and suspicion for acute stroke is low at this time.  Labs are unremarkable, per neurology we will admit to the hospital for further neurologic work-up.  Patient had recurrence of left-sided weakness, was reevaluated by neurology but no further intervention needed at this time.  Case discussed with hospitalist for admission.      ____________________________________________   FINAL CLINICAL IMPRESSION(S) / ED DIAGNOSES  Final diagnoses:  Left-sided weakness  Slurred speech     ED Discharge Orders     None        Note:  This document was prepared using Dragon voice recognition software and may include unintentional dictation errors.    Blake Divine, MD 02/26/21 [phone]

## 2021-02-26 NOTE — Code Documentation (Signed)
Taken to MRI by this RN and Dr Quinn Axe.
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
## 2021-02-26 NOTE — Progress Notes (Signed)
Neurology Brief Update  MRI brain wo contrast did not show any acute infarct earlier this afternoon. 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 Patient was re-examined and had worsened; she now does not have any movement against gravity RUE. CTA/CTP ordered.  Su Monks, MD [facility] [phone]  If 7pm- 7am, please page neurology on call as listed in [place].

## 2021-02-26 NOTE — Code Documentation (Signed)
Stroke Response Nurse Documentation Code Documentation  Teresa [name] is a 80 y.o. female arriving to [facility] ED via Private Vehicle on 02/26/2021 with past medical hx of HTN. Code stroke was activated by ED RN.   Patient from home where she was LKW at 56. Pt woke fine, was heading to do her daily 3 mile walk with her neighbor when she began having of left sided weakness and aphasia.   Stroke team at the bedside on patient arrival. Labs drawn and patient cleared for CT by Dr. Charna Archer. Patient to CT with team. NIHSS 10, see documentation for details and code stroke times. The following imaging was completed:  CT, MRI brain. Patient is not a candidate for IV Thrombolytic due to fluctuating symptoms and MRI results.  Care/Plan: Pt will require Q74min VS and Q66min NIH per Dr Quinn Axe until pt is out of TNK window at 1250.   Bedside handoff with ED RN Melissa.    Velta Addison Stroke Designer, fashion/clothing

## 2021-02-26 NOTE — ED Notes (Signed)
Report given to [facility] in [place]

## 2021-02-26 NOTE — ED Notes (Signed)
Called [facility] activated code stroke  [phone]

## 2021-02-26 NOTE — Progress Notes (Signed)
   Inpatient Rehab Admissions Coordinator :  Per therapy recommendations, patient was screened for CIR candidacy by Danne Baxter RN MSN.  At this time patient appears to be a potential candidate for CIR if admitted Inpatient rather than observation status and further assessment of therapy. I will follow up in then am. Please call me with any questions.  Danne Baxter RN MSN Admissions Coordinator [phone]

## 2021-02-27 ENCOUNTER — Inpatient Hospital Stay: Payer: Medicare PPO

## 2021-02-27 ENCOUNTER — Observation Stay: Admit: 2021-02-27 | Payer: Medicare PPO

## 2021-02-27 ENCOUNTER — Inpatient Hospital Stay
Admit: 2021-02-27 | Discharge: 2021-02-27 | Disposition: A | Discharge status: Home / Self Care | Payer: Medicare PPO | Attending: Neurology | Admitting: Neurology

## 2021-02-27 DIAGNOSIS — R29810 Facial weakness: Secondary | ICD-10-CM | POA: Diagnosis present

## 2021-02-27 DIAGNOSIS — N1832 Chronic kidney disease, stage 3b: Secondary | ICD-10-CM | POA: Diagnosis present

## 2021-02-27 DIAGNOSIS — E039 Hypothyroidism, unspecified: Secondary | ICD-10-CM | POA: Diagnosis present

## 2021-02-27 DIAGNOSIS — E669 Obesity, unspecified: Secondary | ICD-10-CM | POA: Diagnosis present

## 2021-02-27 DIAGNOSIS — I639 Cerebral infarction, unspecified: Secondary | ICD-10-CM | POA: Diagnosis present

## 2021-02-27 DIAGNOSIS — Z79899 Other long term (current) drug therapy: Secondary | ICD-10-CM | POA: Diagnosis not present

## 2021-02-27 DIAGNOSIS — Z9049 Acquired absence of other specified parts of digestive tract: Secondary | ICD-10-CM | POA: Diagnosis not present

## 2021-02-27 DIAGNOSIS — Z79890 Hormone replacement therapy (postmenopausal): Secondary | ICD-10-CM | POA: Diagnosis not present

## 2021-02-27 DIAGNOSIS — Z882 Allergy status to sulfonamides status: Secondary | ICD-10-CM | POA: Diagnosis not present

## 2021-02-27 DIAGNOSIS — R471 Dysarthria and anarthria: Secondary | ICD-10-CM | POA: Diagnosis present

## 2021-02-27 DIAGNOSIS — R531 Weakness: Secondary | ICD-10-CM | POA: Diagnosis not present

## 2021-02-27 DIAGNOSIS — E785 Hyperlipidemia, unspecified: Secondary | ICD-10-CM | POA: Diagnosis present

## 2021-02-27 DIAGNOSIS — N3289 Other specified disorders of bladder: Secondary | ICD-10-CM | POA: Diagnosis present

## 2021-02-27 DIAGNOSIS — G459 Transient cerebral ischemic attack, unspecified: Secondary | ICD-10-CM | POA: Diagnosis not present

## 2021-02-27 DIAGNOSIS — Z90710 Acquired absence of both cervix and uterus: Secondary | ICD-10-CM | POA: Diagnosis not present

## 2021-02-27 DIAGNOSIS — Z20822 Contact with and (suspected) exposure to covid-19: Secondary | ICD-10-CM | POA: Diagnosis present

## 2021-02-27 DIAGNOSIS — E639 Nutritional deficiency, unspecified: Secondary | ICD-10-CM | POA: Diagnosis not present

## 2021-02-27 DIAGNOSIS — I63511 Cerebral infarction due to unspecified occlusion or stenosis of right middle cerebral artery: Principal | ICD-10-CM

## 2021-02-27 DIAGNOSIS — Z85828 Personal history of other malignant neoplasm of skin: Secondary | ICD-10-CM | POA: Diagnosis not present

## 2021-02-27 DIAGNOSIS — R1312 Dysphagia, oropharyngeal phase: Secondary | ICD-10-CM | POA: Diagnosis present

## 2021-02-27 DIAGNOSIS — E876 Hypokalemia: Secondary | ICD-10-CM | POA: Diagnosis not present

## 2021-02-27 DIAGNOSIS — I129 Hypertensive chronic kidney disease with stage 1 through stage 4 chronic kidney disease, or unspecified chronic kidney disease: Secondary | ICD-10-CM | POA: Diagnosis present

## 2021-02-27 DIAGNOSIS — R29710 NIHSS score 10: Secondary | ICD-10-CM | POA: Diagnosis present

## 2021-02-27 DIAGNOSIS — G8194 Hemiplegia, unspecified affecting left nondominant side: Secondary | ICD-10-CM | POA: Diagnosis present

## 2021-02-27 DIAGNOSIS — I7300 Raynaud's syndrome without gangrene: Secondary | ICD-10-CM | POA: Diagnosis present

## 2021-02-27 DIAGNOSIS — Z981 Arthrodesis status: Secondary | ICD-10-CM | POA: Diagnosis not present

## 2021-02-27 DIAGNOSIS — R4781 Slurred speech: Secondary | ICD-10-CM | POA: Diagnosis not present

## 2021-02-27 LAB — LIPID PANEL
Cholesterol: 174 mg/dL (ref 0–200)
HDL: 76 mg/dL (ref >40)
LDL Cholesterol: 85 mg/dL (ref 0–99)
Total CHOL/HDL Ratio: 2.3 RATIO
Triglycerides: 65 mg/dL (ref <150)
VLDL: 13 mg/dL (ref 0–40)

## 2021-02-27 LAB — HEMOGLOBIN A1C
Hgb A1c MFr Bld: 5.6 % (ref 4.8–5.6)
Hgb A1c MFr Bld: 5.4 % (ref 4.8–5.6)
Mean Plasma Glucose: 114 mg/dL
Mean Plasma Glucose: 108 mg/dL

## 2021-02-27 IMAGING — US US RENAL
1 series · 14 of 24 positions shown · non-contrast
Comparison: Ultrasound [DATE]

CLINICAL DATA: Acute kidney injury

EXAM:
RENAL / URINARY TRACT ULTRASOUND COMPLETE

[Series 1: us renal · 14 of 24 slices shown]
[im 1/24]
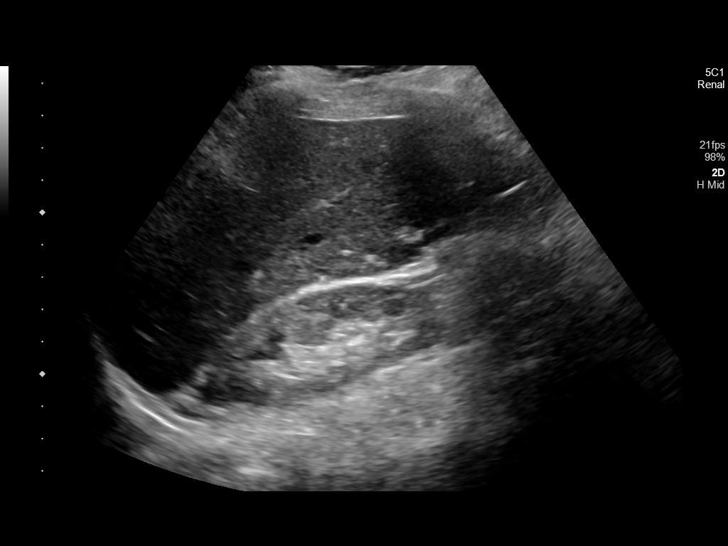
[im 3/24]
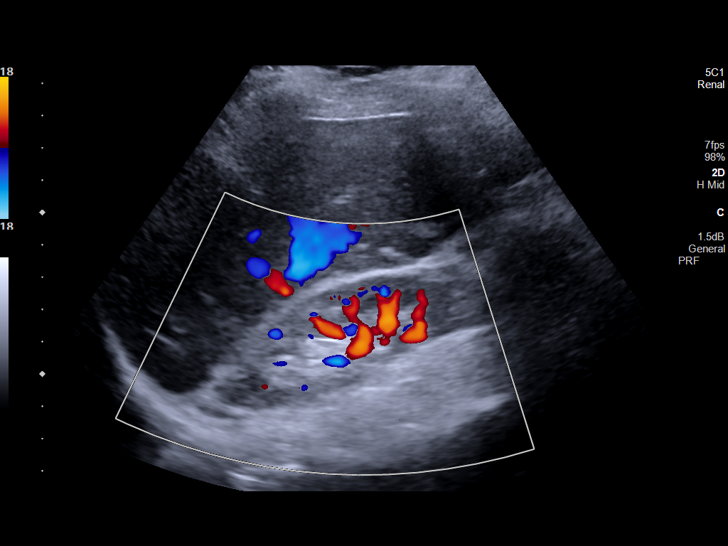
[im 5/24]
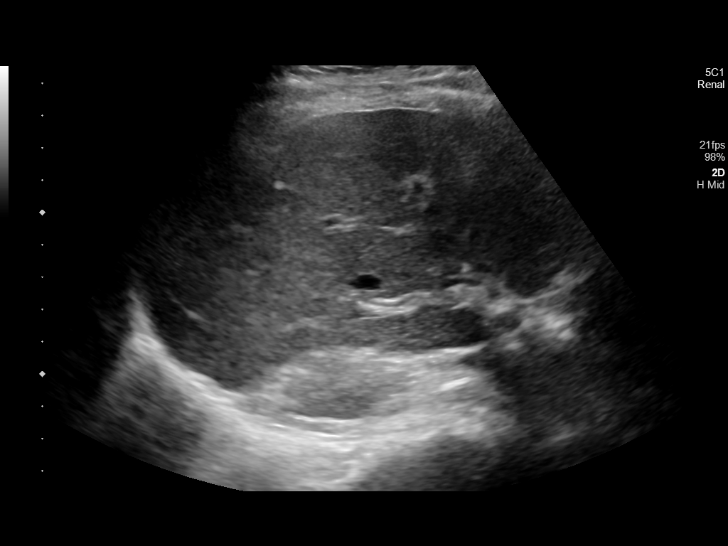
[im 7/24]
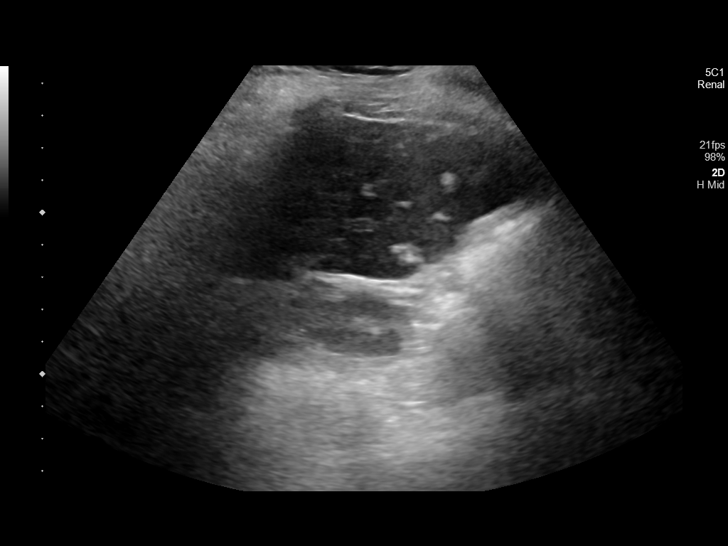
[im 8/24]
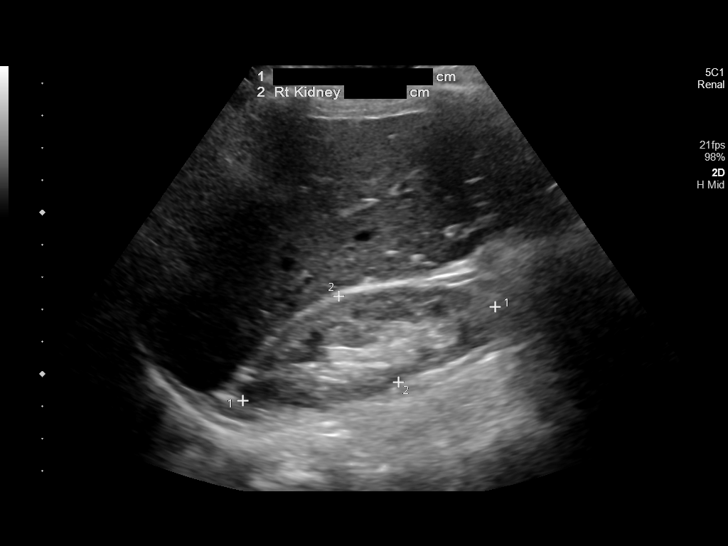
[im 10/24]
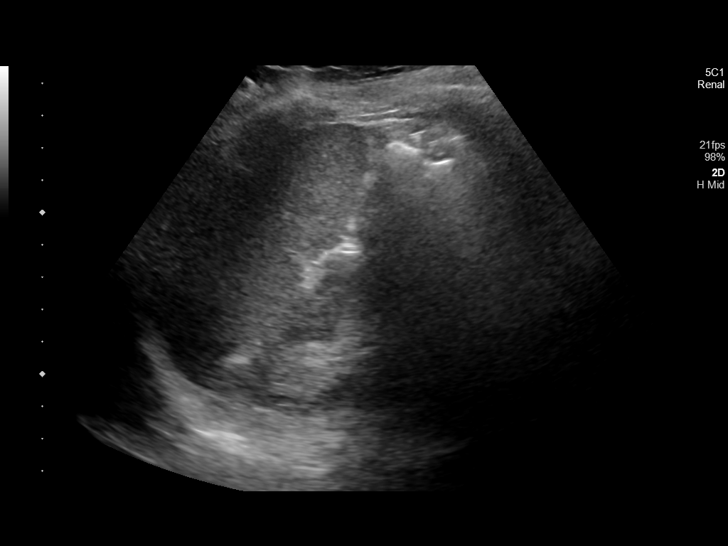
[im 12/24]
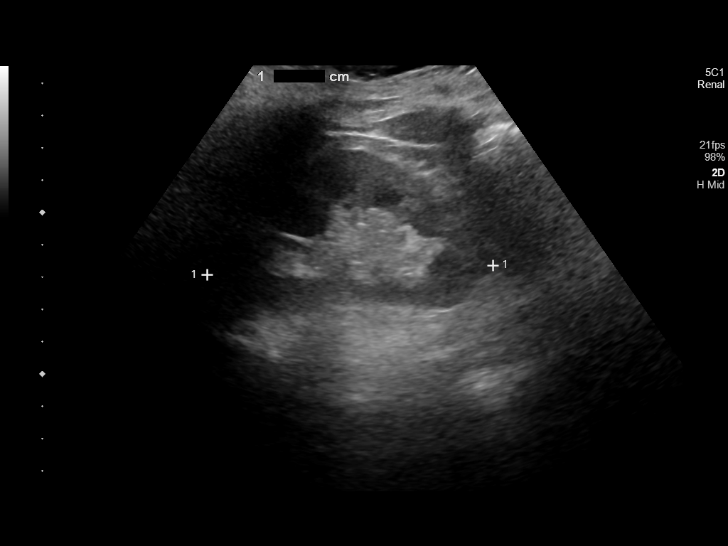
[im 13/24]
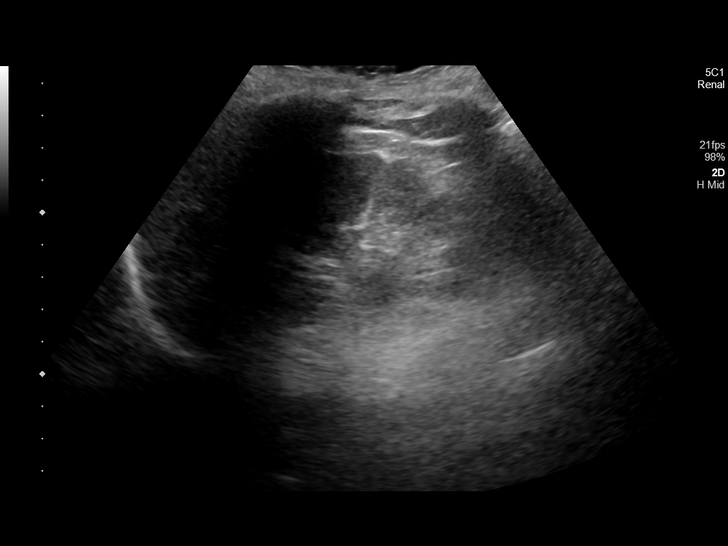
[im 15/24]
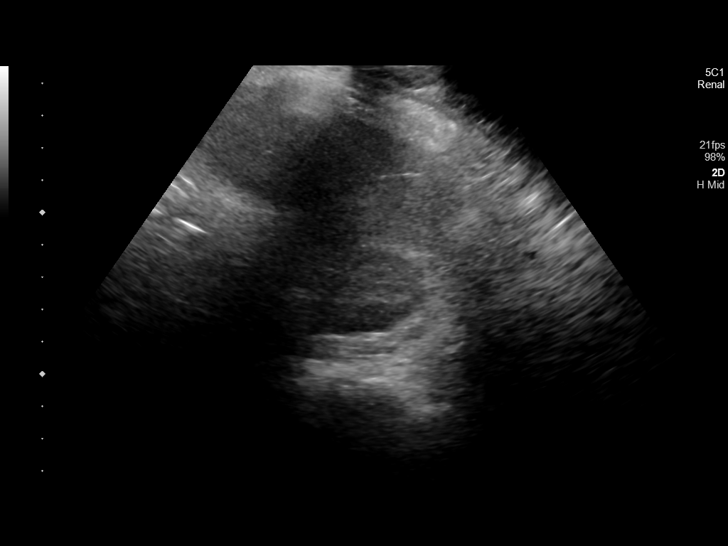
[im 17/24]
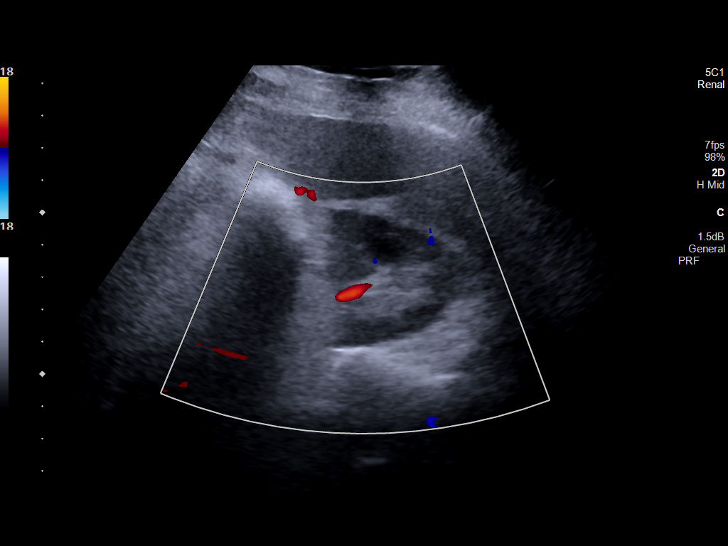
[im 19/24]
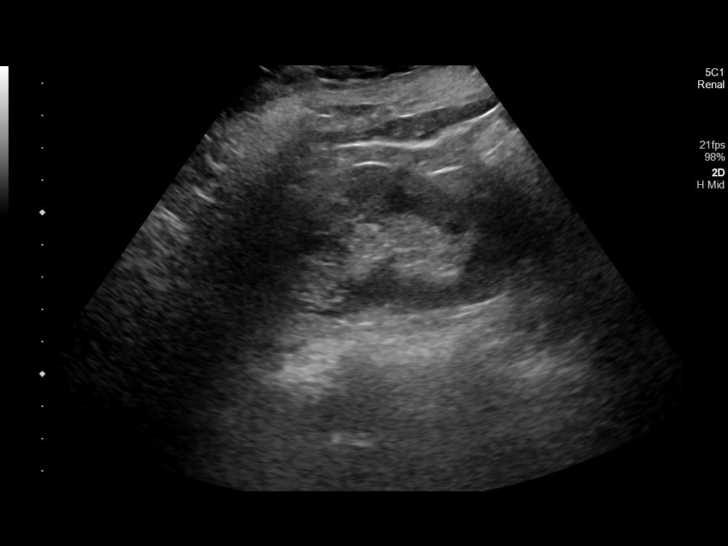
[im 20/24]
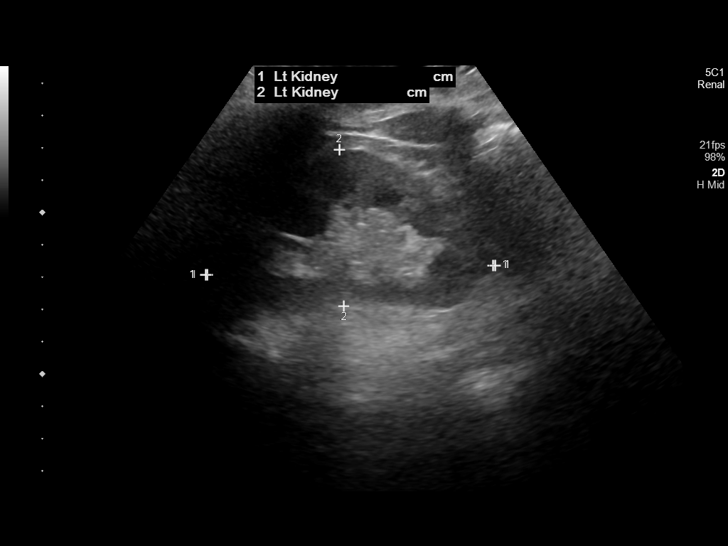
[im 22/24]
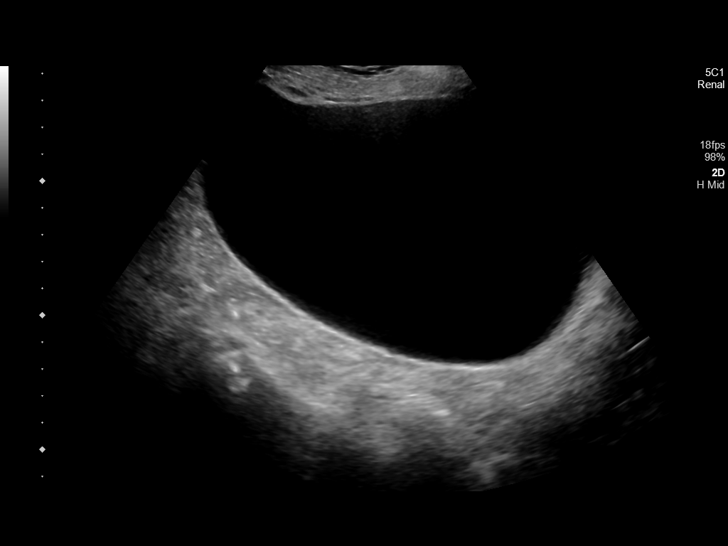
[im 24/24]
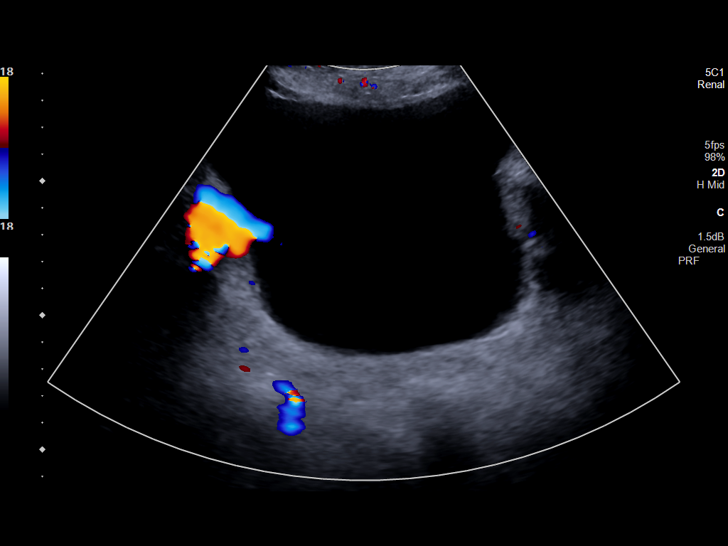

[14 of 24 positions shown; findings below may reference images not displayed]

FINDINGS: Right Kidney:

Renal measurements: 8.3 x 3.2 x 4.1 cm = volume: 58 mL. Cortex is
echogenic. No mass or hydronephrosis.

Left Kidney:

Renal measurements: 9 x 4.8 x 4.3 cm = volume: 98.3 mL. Echogenicity
within normal limits. No mass or hydronephrosis.

Bladder:

Appears normal for degree of bladder distention.

Other:

None.
IMPRESSION: 1. Slightly echogenic right kidney consistent with medical renal
disease. Negative for hydronephrosis
2. Left kidney is within normal limits.

## 2021-02-27 MED ORDER — CHLORHEXIDINE GLUCONATE CLOTH 2 % EX PADS
6.0000 | MEDICATED_PAD | Freq: Every day | CUTANEOUS | Status: DC
  Administered 2021-02-28: 15:00:00 6 via TOPICAL

## 2021-02-27 MED ORDER — MORPHINE SULFATE (PF) 2 MG/ML IV SOLN
0.2500 mg | Freq: Once | INTRAVENOUS | Status: AC
  Administered 2021-02-27: 0.25 mg via INTRAVENOUS
  Filled 2021-02-27: qty 1

## 2021-02-27 MED ORDER — ROSUVASTATIN CALCIUM 10 MG PO TABS
10.0000 mg | ORAL_TABLET | Freq: Every day | ORAL | Status: DC
  Administered 2021-02-28: 10 mg via ORAL
  Filled 2021-02-27: qty 1
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
## 2021-02-27 NOTE — Progress Notes (Signed)
Inpatient Rehabilitation Admissions Coordinator   I will place rehab consult order now that patient moved to inpatient status.  Danne Baxter, RN, MSN Rehab Admissions Coordinator [phone] 02/27/2021 1:23 PM
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
## 2021-02-27 NOTE — Progress Notes (Signed)
Inpatient Rehab Admissions Coordinator:    I spoke with pt.'s son, Herbie [place], regarding potential CIR admit. He stated interest but is not sure family can provide adequate support at discharge. He wants to see how Pt. Does with therapies tomorrow and discuss with other family and will get back to me tomorrow.   Clemens Catholic, [place], [place] Admissions Coordinator  [phone] ([place]) [phone] (office)

## 2021-02-27 NOTE — Progress Notes (Signed)
New order to bladder scan patient and it shows she has greater than 951ml in her bladder but yet urinating maybe not emptying her bladder all the way. Messaged and paged MD about same.

## 2021-02-27 NOTE — Progress Notes (Signed)
*  PRELIMINARY RESULTS* Echocardiogram 2D Echocardiogram has been performed.  Sherrie Sport 02/27/2021, 2:23 PM
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
## 2021-02-27 NOTE — Progress Notes (Signed)
Eeg done 
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
## 2021-02-28 ENCOUNTER — Inpatient Hospital Stay: Payer: Medicare PPO

## 2021-02-28 DIAGNOSIS — I639 Cerebral infarction, unspecified: Secondary | ICD-10-CM

## 2021-02-28 LAB — CBC
HCT: 37.9 % (ref 36.0–46.0)
Hemoglobin: 13.2 g/dL (ref 12.0–15.0)
MCH: 30.8 pg (ref 26.0–34.0)
MCHC: 34.8 g/dL (ref 30.0–36.0)
MCV: 88.3 fL (ref 80.0–100.0)
Platelets: 193 10*3/uL (ref 150–400)
RBC: 4.29 MIL/uL (ref 3.87–5.11)
RDW: 12.5 % (ref 11.5–15.5)
WBC: 6.7 10*3/uL (ref 4.0–10.5)
nRBC: 0 % (ref 0.0–0.2)

## 2021-02-28 LAB — ECHOCARDIOGRAM COMPLETE BUBBLE STUDY
AR max vel: 2.32 cm2
AV Area VTI: 2.84 cm2
AV Area mean vel: 2.17 cm2
AV Mean grad: 3 mmHg
AV Peak grad: 5.4 mmHg
Ao pk vel: 1.16 m/s
Area-P 1/2: 4.49 cm2
MV VTI: 3.09 cm2
S' Lateral: 2.2 cm

## 2021-02-28 LAB — BASIC METABOLIC PANEL
Anion gap: 9 (ref 5–15)
BUN: 22 mg/dL (ref 8–23)
CO2: 23 mmol/L (ref 22–32)
Calcium: 9 mg/dL (ref 8.9–10.3)
Chloride: 108 mmol/L (ref 98–111)
Creatinine, Ser: 1.08 mg/dL — ABNORMAL HIGH (ref 0.44–1.00)
GFR, Estimated: 53 mL/min — ABNORMAL LOW (ref >60)
Glucose, Bld: 94 mg/dL (ref 70–99)
Potassium: 3.4 mmol/L — ABNORMAL LOW (ref 3.5–5.1)
Sodium: 140 mmol/L (ref 135–145)

## 2021-02-28 LAB — MAGNESIUM: Magnesium: 2.2 mg/dL (ref 1.7–2.4)

## 2021-02-28 LAB — PHOSPHORUS: Phosphorus: 2.5 mg/dL (ref 2.5–4.6)

## 2021-02-28 MED ORDER — LOSARTAN POTASSIUM 50 MG PO TABS: 50.0000 mg | ORAL_TABLET | Freq: Every day | ORAL | Status: DC

## 2021-02-28 MED ORDER — POTASSIUM CHLORIDE CRYS ER 20 MEQ PO TBCR: 40.0000 meq | EXTENDED_RELEASE_TABLET | Freq: Once | ORAL | Status: DC

## 2021-02-28 MED ORDER — ADULT MULTIVITAMIN W/MINERALS CH
1.0000 | ORAL_TABLET | Freq: Every day | ORAL | Status: DC
  Administered 2021-02-28 – 2021-03-01 (×2): 1 via ORAL
  Filled 2021-02-28 (×2): qty 1

## 2021-02-28 MED ORDER — POLYETHYLENE GLYCOL 3350 17 G PO PACK
17.0000 g | PACK | Freq: Every day | ORAL | Status: DC
  Filled 2021-02-28: qty 1
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
## 2021-02-28 NOTE — Progress Notes (Addendum)
Inpatient Rehab Admissions Coordinator:   I spoke with Pt.'s daughter, [name], who states she is unsure if fPt. Will have adequate support following discharge from CIR or not. States that her brother's wife is only able to stay with Pt. For a few weeks and that she and pt.'s spouse do not get along. I encouraged her to discuss options as a family, as we must have a clear discharge plan in order to accept Pt. To CIR.   Clemens Catholic, [place], [place] Admissions Coordinator  [phone] ([place]) [phone] (office)

## 2021-02-28 NOTE — Progress Notes (Addendum)
Patient family asked to speak with case management about insurance coverage for rehab and on going care of their mothers recovery. Message sent to case management Awanya to reach out to family.

## 2021-02-28 NOTE — Progress Notes (Signed)
Inpatient Rehab Admissions Coordinator:   I do not have a bed today for this Pt. On CIR. I will call Pt.'s son to see if he has made a decision on CIR vs SNF.  Clemens Catholic, [place], [place] Admissions Coordinator  [phone] ([place]) [phone] (office)
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
## 2021-02-28 NOTE — TOC Progression Note (Addendum)
Transition of Care [facility]) - Progression Note    Patient Details  Name: Teresa [name] MRN: [id number] Date of Birth: 09-Nov-1940  [facility]) CM/SW Contact  Kerin Salen, RN Phone Number: 02/28/2021, 1:30 PM  Clinical Narrative: TOCRN spoke with [place] who indicated she spoke with patient's Son and he agreed to [facility] and they will proceed. TOC to continue to track for discharge needs. Daughter [name] [phone] requested CIR call to discuss patient care plan, says Son will not provide information. Staley notified indicated that she called [name] and left voice message.         Expected Discharge Plan and Services                                                 Social Determinants of Health (SDOH) Interventions    Readmission Risk Interventions No flowsheet data found.

## 2021-02-28 NOTE — Progress Notes (Signed)
Neurology progress note  S: Patient's initial MRI after stroke code initiated did not show any restricted diffusion. Subsequent MRA did show possible focal high-grade stenosis vs occlusion of R M2. On re-examination, patient's L-sided weakness had worsened. CTA/CTP performed at that time confirmed R M2 occlusion and showed a completed infarct in that region. 2/2 only 9 cc mismatch patient was not a candidate for IR. Today, she has minimal movement LUE and LLE which no movement against gravity. She failed her swallow study. She worked hard with PT and OT and they have recommended CIR acute rehab.  TTE: pending  Stroke Labs     Component Value Date/Time   CHOL 174 02/27/2021 0536   TRIG 65 02/27/2021 0536   HDL 76 02/27/2021 0536   CHOLHDL 2.3 02/27/2021 0536   VLDL 13 02/27/2021 0536   LDLCALC 85 02/27/2021 0536   LDLDIRECT 83.7 02/26/2021 0905    Lab Results  Component Value Date/Time   HGBA1C 5.4 02/27/2021 05:36 AM  ;  O:  Vitals:   02/28/21 0610 02/28/21 0743  BP: (!) 158/90 (!) 185/91  Pulse: 82 86  Resp: 19 18  Temp: 98.7 F (37.1 C) 98.7 F (37.1 C)  SpO2: 98% 99%   Gen: A&Ox4 CV: RRR Resp: CTAB  MS: A&Ox4, follows commands Speech: moderate dysarthria, able to name and repeat CN: PERRL, VFF, EOMI, L UMN facial droop, hearing intact to voice Motor: RUE and RLE full strength, LUE and LLE minimal movement and none against gravity Sensory: impaired to LT LUE and LLE Reflexes: 2+ throughout, toes down bilat Coordination: WNL on R, UTA on L Gait: deferred  A/P: 80 yo woman with hx hypothyroidism and Raynaud's p/w L sided weakness and found to have a R MCA infarct 2/2 R M2 occlusion, not candidate for TNK (2/2 hx prior ICH) or IR (2/2 insufficient mismatch volume).  - After 48 hrs, gently lower BP to goal normotension, strict avoidance of hypotension - LDL is at goal, no indication for statin - ASA 81mg  daily + plavix 75mg  daily x21 days f/b plavix 75mg  daily  monotherapy after that - F/u EEG - F/u TTE - q4 hr neuro checks - STAT head CT for any change in neuro exam - Tele - PT/OT/SLP - Stroke education - Amb referral to neurology upon discharge  - Amb cardiac monitor upon discharge  Will continue to follow  Su Monks, MD [facility] [phone]  If 7pm- 7am, please page neurology on call as listed in [place].
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
## 2021-02-28 NOTE — Progress Notes (Signed)
Inpatient Rehab Admissions Coordinator:   I spoke to this Pt.'s son Herbie [place] regarding potential CIR admission. He states that his wife plans to come and stay with Pt. After discharge from CIR and they wish to pursue CIR rather than SNF. I will open a case with Pt.'s insurance and pursue for admission.   Clemens Catholic, [place], [place] Admissions Coordinator  [phone] ([place]) [phone] (office)

## 2021-03-01 ENCOUNTER — Encounter (HOSPITAL_COMMUNITY): Payer: Self-pay | Admitting: Physical Medicine and Rehabilitation

## 2021-03-01 ENCOUNTER — Other Ambulatory Visit: Payer: Self-pay

## 2021-03-01 ENCOUNTER — Inpatient Hospital Stay (HOSPITAL_COMMUNITY)
Admission: RE | Admit: 2021-03-01 | Discharge: 2021-03-23 | DRG: 057 | Disposition: A | Discharge status: Home Health | Payer: Medicare PPO | Source: Other Acute Inpatient Hospital | Attending: Physical Medicine and Rehabilitation | Admitting: Physical Medicine and Rehabilitation

## 2021-03-01 DIAGNOSIS — R32 Unspecified urinary incontinence: Secondary | ICD-10-CM | POA: Diagnosis present

## 2021-03-01 DIAGNOSIS — I63511 Cerebral infarction due to unspecified occlusion or stenosis of right middle cerebral artery: Secondary | ICD-10-CM | POA: Diagnosis not present

## 2021-03-01 DIAGNOSIS — R4189 Other symptoms and signs involving cognitive functions and awareness: Secondary | ICD-10-CM | POA: Diagnosis present

## 2021-03-01 DIAGNOSIS — I10 Essential (primary) hypertension: Secondary | ICD-10-CM | POA: Diagnosis present

## 2021-03-01 DIAGNOSIS — Z79899 Other long term (current) drug therapy: Secondary | ICD-10-CM | POA: Diagnosis not present

## 2021-03-01 DIAGNOSIS — R1312 Dysphagia, oropharyngeal phase: Secondary | ICD-10-CM | POA: Diagnosis present

## 2021-03-01 DIAGNOSIS — Z7982 Long term (current) use of aspirin: Secondary | ICD-10-CM

## 2021-03-01 DIAGNOSIS — M542 Cervicalgia: Secondary | ICD-10-CM | POA: Diagnosis present

## 2021-03-01 DIAGNOSIS — I7300 Raynaud's syndrome without gangrene: Secondary | ICD-10-CM | POA: Diagnosis present

## 2021-03-01 DIAGNOSIS — R339 Retention of urine, unspecified: Secondary | ICD-10-CM | POA: Diagnosis present

## 2021-03-01 DIAGNOSIS — R52 Pain, unspecified: Secondary | ICD-10-CM

## 2021-03-01 DIAGNOSIS — E669 Obesity, unspecified: Secondary | ICD-10-CM | POA: Diagnosis present

## 2021-03-01 DIAGNOSIS — R7989 Other specified abnormal findings of blood chemistry: Secondary | ICD-10-CM | POA: Diagnosis not present

## 2021-03-01 DIAGNOSIS — Z885 Allergy status to narcotic agent status: Secondary | ICD-10-CM | POA: Diagnosis not present

## 2021-03-01 DIAGNOSIS — Z882 Allergy status to sulfonamides status: Secondary | ICD-10-CM | POA: Diagnosis not present

## 2021-03-01 DIAGNOSIS — I69322 Dysarthria following cerebral infarction: Secondary | ICD-10-CM

## 2021-03-01 DIAGNOSIS — I69354 Hemiplegia and hemiparesis following cerebral infarction affecting left non-dominant side: Secondary | ICD-10-CM | POA: Diagnosis present

## 2021-03-01 DIAGNOSIS — I69391 Dysphagia following cerebral infarction: Secondary | ICD-10-CM | POA: Diagnosis not present

## 2021-03-01 DIAGNOSIS — Z79890 Hormone replacement therapy (postmenopausal): Secondary | ICD-10-CM

## 2021-03-01 DIAGNOSIS — E785 Hyperlipidemia, unspecified: Secondary | ICD-10-CM | POA: Diagnosis present

## 2021-03-01 DIAGNOSIS — E039 Hypothyroidism, unspecified: Secondary | ICD-10-CM | POA: Diagnosis present

## 2021-03-01 DIAGNOSIS — I69392 Facial weakness following cerebral infarction: Secondary | ICD-10-CM

## 2021-03-01 DIAGNOSIS — Z880 Allergy status to penicillin: Secondary | ICD-10-CM

## 2021-03-01 DIAGNOSIS — N179 Acute kidney failure, unspecified: Secondary | ICD-10-CM | POA: Diagnosis present

## 2021-03-01 DIAGNOSIS — M47892 Other spondylosis, cervical region: Secondary | ICD-10-CM | POA: Diagnosis present

## 2021-03-01 DIAGNOSIS — I639 Cerebral infarction, unspecified: Secondary | ICD-10-CM | POA: Diagnosis not present

## 2021-03-01 LAB — BASIC METABOLIC PANEL
Anion gap: 9 (ref 5–15)
BUN: 21 mg/dL (ref 8–23)
CO2: 24 mmol/L (ref 22–32)
Calcium: 8.7 mg/dL — ABNORMAL LOW (ref 8.9–10.3)
Chloride: 106 mmol/L (ref 98–111)
Creatinine, Ser: 0.89 mg/dL (ref 0.44–1.00)
GFR, Estimated: >60 mL/min (ref >60)
Glucose, Bld: 94 mg/dL (ref 70–99)
Potassium: 3.4 mmol/L — ABNORMAL LOW (ref 3.5–5.1)
Sodium: 139 mmol/L (ref 135–145)

## 2021-03-01 LAB — CBC
HCT: 35.6 % — ABNORMAL LOW (ref 36.0–46.0)
HCT: 35.6 % — ABNORMAL LOW (ref 36.0–46.0)
Hemoglobin: 12.5 g/dL (ref 12.0–15.0)
Hemoglobin: 12.3 g/dL (ref 12.0–15.0)
MCH: 30.8 pg (ref 26.0–34.0)
MCH: 30.4 pg (ref 26.0–34.0)
MCHC: 35.1 g/dL (ref 30.0–36.0)
MCHC: 34.6 g/dL (ref 30.0–36.0)
MCV: 87.7 fL (ref 80.0–100.0)
MCV: 87.9 fL (ref 80.0–100.0)
Platelets: 180 10*3/uL (ref 150–400)
Platelets: 167 10*3/uL (ref 150–400)
RBC: 4.06 MIL/uL (ref 3.87–5.11)
RBC: 4.05 MIL/uL (ref 3.87–5.11)
RDW: 12.3 % (ref 11.5–15.5)
RDW: 12.3 % (ref 11.5–15.5)
WBC: 7.8 10*3/uL (ref 4.0–10.5)
WBC: 8.6 10*3/uL (ref 4.0–10.5)
nRBC: 0 % (ref 0.0–0.2)
nRBC: 0 % (ref 0.0–0.2)

## 2021-03-01 LAB — PHOSPHORUS: Phosphorus: 2.2 mg/dL — ABNORMAL LOW (ref 2.5–4.6)

## 2021-03-01 LAB — MAGNESIUM: Magnesium: 2.1 mg/dL (ref 1.7–2.4)

## 2021-03-01 LAB — CREATININE, SERUM
Creatinine, Ser: 0.95 mg/dL (ref 0.44–1.00)
GFR, Estimated: >60 mL/min (ref >60)

## 2021-03-01 MED ORDER — SENNOSIDES-DOCUSATE SODIUM 8.6-50 MG PO TABS: 1.0000 | ORAL_TABLET | Freq: Every evening | ORAL | Status: DC | PRN

## 2021-03-01 MED ORDER — ENOXAPARIN SODIUM 40 MG/0.4ML IJ SOSY
40.0000 mg | PREFILLED_SYRINGE | INTRAMUSCULAR | Status: DC
  Administered 2021-03-01 – 2021-03-07 (×7): 40 mg via SUBCUTANEOUS
  Filled 2021-03-01 (×7): qty 0.4

## 2021-03-01 MED ORDER — CLOPIDOGREL BISULFATE 75 MG PO TABS
75.0000 mg | ORAL_TABLET | Freq: Every day | ORAL | Status: DC
  Administered 2021-03-02 – 2021-03-23 (×22): 75 mg via ORAL
  Filled 2021-03-01 (×22): qty 1

## 2021-03-01 MED ORDER — LOSARTAN POTASSIUM 25 MG PO TABS: 75.0000 mg | ORAL_TABLET | Freq: Every day | ORAL | 0 refills | Status: DC

## 2021-03-01 MED ORDER — ROSUVASTATIN CALCIUM 5 MG PO TABS
10.0000 mg | ORAL_TABLET | Freq: Every day | ORAL | Status: DC
  Administered 2021-03-01 – 2021-03-22 (×22): 10 mg via ORAL
  Filled 2021-03-01 (×22): qty 2

## 2021-03-01 MED ORDER — ORAL CARE MOUTH RINSE
15.0000 mL | Freq: Two times a day (BID) | OROMUCOSAL | Status: DC
  Administered 2021-03-01: 10:00:00 15 mL via OROMUCOSAL

## 2021-03-01 MED ORDER — ACETAMINOPHEN 650 MG RE SUPP: 650.0000 mg | RECTAL | Status: DC | PRN

## 2021-03-01 MED ORDER — CLOPIDOGREL BISULFATE 75 MG PO TABS: 75.0000 mg | ORAL_TABLET | Freq: Every day | ORAL | 11 refills | Status: DC

## 2021-03-01 MED ORDER — PANTOPRAZOLE SODIUM 40 MG IV SOLR
40.0000 mg | INTRAVENOUS | Status: DC
  Administered 2021-03-01 – 2021-03-03 (×3): 40 mg via INTRAVENOUS
  Filled 2021-03-01 (×3): qty 40

## 2021-03-01 MED ORDER — K PHOS MONO-SOD PHOS DI & MONO 155-852-130 MG PO TABS
500.0000 mg | ORAL_TABLET | Freq: Three times a day (TID) | ORAL | Status: DC
  Administered 2021-03-01: 10:00:00 500 mg via ORAL
  Filled 2021-03-01 (×3): qty 2

## 2021-03-01 MED ORDER — CHLORHEXIDINE GLUCONATE CLOTH 2 % EX PADS
6.0000 | MEDICATED_PAD | Freq: Two times a day (BID) | CUTANEOUS | Status: DC
  Administered 2021-03-01 – 2021-03-02 (×2): 6 via TOPICAL

## 2021-03-01 MED ORDER — ACETAMINOPHEN 160 MG/5ML PO SOLN: 650.0000 mg | ORAL | Status: DC | PRN

## 2021-03-01 MED ORDER — ASPIRIN 81 MG PO CHEW
81.0000 mg | CHEWABLE_TABLET | Freq: Every day | ORAL | Status: AC
  Administered 2021-03-02 – 2021-03-18 (×17): 81 mg via ORAL
  Filled 2021-03-01 (×17): qty 1

## 2021-03-01 MED ORDER — ENOXAPARIN SODIUM 40 MG/0.4ML IJ SOSY: 40.0000 mg | PREFILLED_SYRINGE | INTRAMUSCULAR | Status: DC

## 2021-03-01 MED ORDER — BLOOD PRESSURE CONTROL BOOK
Freq: Once | Status: AC
Start: 2021-03-01 — End: 2021-03-01
  Filled 2021-03-01: qty 1

## 2021-03-01 MED ORDER — ASPIRIN EC 81 MG PO TBEC: 81.0000 mg | DELAYED_RELEASE_TABLET | Freq: Every day | ORAL | 0 refills | Status: DC

## 2021-03-01 MED ORDER — LEVOTHYROXINE SODIUM 100 MCG PO TABS
100.0000 ug | ORAL_TABLET | Freq: Every day | ORAL | Status: DC
  Administered 2021-03-02 – 2021-03-23 (×22): 100 ug via ORAL
  Filled 2021-03-01 (×23): qty 1

## 2021-03-01 MED ORDER — LOSARTAN POTASSIUM 50 MG PO TABS
75.0000 mg | ORAL_TABLET | Freq: Every day | ORAL | Status: DC
  Administered 2021-03-01: 10:00:00 75 mg via ORAL
  Filled 2021-03-01: qty 1

## 2021-03-01 MED ORDER — POLYETHYLENE GLYCOL 3350 17 G PO PACK
17.0000 g | PACK | Freq: Every day | ORAL | Status: DC
  Administered 2021-03-02 – 2021-03-23 (×21): 17 g via ORAL
  Filled 2021-03-01 (×21): qty 1

## 2021-03-01 MED ORDER — MUSCLE RUB 10-15 % EX CREA
TOPICAL_CREAM | CUTANEOUS | Status: DC | PRN
  Administered 2021-03-12: 1 via TOPICAL
  Filled 2021-03-01: qty 85

## 2021-03-01 MED ORDER — ACETAMINOPHEN 325 MG PO TABS
650.0000 mg | ORAL_TABLET | ORAL | Status: DC | PRN
  Administered 2021-03-02 – 2021-03-14 (×6): 650 mg via ORAL
  Filled 2021-03-01 (×7): qty 2

## 2021-03-01 MED ORDER — ADULT MULTIVITAMIN W/MINERALS CH
1.0000 | ORAL_TABLET | Freq: Every day | ORAL | Status: DC
  Administered 2021-03-02 – 2021-03-23 (×22): 1 via ORAL
  Filled 2021-03-01 (×21): qty 1

## 2021-03-01 MED ORDER — LOSARTAN POTASSIUM 50 MG PO TABS
75.0000 mg | ORAL_TABLET | Freq: Every day | ORAL | Status: DC
  Administered 2021-03-02 – 2021-03-08 (×7): 75 mg via ORAL
  Filled 2021-03-01 (×7): qty 1

## 2021-03-01 MED ORDER — K PHOS MONO-SOD PHOS DI & MONO 155-852-130 MG PO TABS
500.0000 mg | ORAL_TABLET | Freq: Three times a day (TID) | ORAL | Status: AC
  Administered 2021-03-01 – 2021-03-02 (×5): 500 mg via ORAL
  Filled 2021-03-01 (×5): qty 2

## 2021-03-01 NOTE — Procedures (Signed)
Routine EEG Report  Teresa [name] is a 80 y.o. female with a history of stroke who is undergoing an EEG to evaluate for seizures.  Report: This EEG was acquired with electrodes placed according to the International 10-20 electrode system (including Fp1, Fp2, F3, F4, C3, C4, P3, P4, O1, O2, T3, T4, T5, T6, A1, A2, Fz, Cz, Pz). The following electrodes were missing or displaced: none.  The occipital dominant rhythm was 10 Hz. This activity is reactive to stimulation. Drowsiness was manifested by background fragmentation; deeper stages of sleep were identified by K complexes and sleep spindles. There was focal slowing over the right hemisphere. There were no interictal epileptiform discharges. There were no electrographic seizures identified. There was no abnormal response to photic stimulation or hyperventilation.   Impression: This EEG was obtained while awake and asleep and is abnormal due to right hemispheric slowing. No epileptiform abnormalities were observed during the recording.  Su Monks, MD [facility] [phone]  If 7pm- 7am, please page neurology on call as listed in [place].
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
## 2021-03-01 NOTE — Progress Notes (Addendum)
Inpatient Rehabilitation Admission Medication Review by a Pharmacist  A complete drug regimen review was completed for this patient to identify any potential clinically significant medication issues.  High Risk Drug Classes Is patient taking? Indication by Medication  Antipsychotic No   Anticoagulant Yes Lovenox- VTE prophylaxis  Antibiotic No   Opioid No   Antiplatelet Yes Baby aspirin/plavix- CVA prophylaxis  Hypoglycemics/insulin No   Vasoactive Medication Yes Losartan- hypertension  Chemotherapy No   Other Yes Crestor- HLD Synthroid- hypothyroidism Pantoprazole- GERD Miralax- constipation     Type of Medication Issue Identified Description of Issue Recommendation(s)  Drug Interaction(s) (clinically significant)     Duplicate Therapy     Allergy     No Medication Administration End Date  DAPT- Aspirin/Plavix Per neurology note dated 02/27/2021: DAPT (aspirin/Plavix) x3 weeks (started 02/26/2021 End: 03/18/2021) followed by Plavix monotherapy.  Incorrect Dose     Additional Drug Therapy Needed     Significant med changes from prior encounter (inform family/care partners about these prior to discharge).    Other  PTA meds: vitamin D3, Calcium carbonate, flexaril, gabapentin, magnesium oxide, sular, omega-3 fatty acids Restart PTA meds when and if necessary during stay on CIR or upon discharge from CIR    Clinically significant medication issues were identified that warrant physician communication and completion of prescribed/recommended actions by midnight of the next day:  No   Time spent performing this drug regimen review (minutes):  30  Teresa [name] BS, PharmD, BCPS Clinical Pharmacist  03/01/2021 1:46 PM

## 2021-03-01 NOTE — Discharge Instructions (Addendum)
Inpatient Rehab Discharge Instructions  [place] Discharge date and time: No discharge date for patient encounter.   Activities/Precautions/ Functional Status: Activity: activity as tolerated Diet: Regular medications at discharge medications at discharge Wound Care: Routine skin checks Functional status:  ___ No restrictions     ___ Walk up steps independently ___ 24/7 supervision/assistance   ___ Walk up steps with assistance ___ Intermittent supervision/assistance  ___ Bathe/dress independently ___ Walk with walker     _x__ Bathe/dress with assistance ___ Walk Independently    ___ Shower independently ___ Walk with assistance    ___ Shower with assistance ___ No alcohol     ___ Return to work/school ________  Special Instructions:  No driving smoking or alcohol      COMMUNITY REFERRALS UPON DISCHARGE:    Home Health:   PT  OT   SP  AIDE                Agency:  [facility]     Phone: [phone]    Medical Equipment/Items Ordered: WHEELCHAIR                                                 Agency/Supplier: [facility]  [phone]  PRIVATE DUTY LIST GIVEN TO FAMILY TO FOLLOW UP IN CASE WANT HIRED CNA ASSIST    STROKE/TIA DISCHARGE INSTRUCTIONS SMOKING Cigarette smoking nearly doubles your risk of having a stroke & is the single most alterable risk factor  If you smoke or have smoked in the last 12 months, you are advised to quit smoking for your health. Most of the excess cardiovascular risk related to smoking disappears within a year of stopping. Ask you doctor about anti-smoking medications [place] Quit Line: 1-800-QUIT NOW Free Smoking Cessation Classes [phone]  CHOLESTEROL Know your levels; limit fat & cholesterol in your diet  Lipid Panel     Component Value Date/Time   CHOL 174 02/27/2021 0536   TRIG 65 02/27/2021 0536   HDL 76 02/27/2021 0536   CHOLHDL 2.3 02/27/2021 0536   VLDL 13 02/27/2021 0536   LDLCALC 85 02/27/2021 0536      Many patients benefit from treatment even if their cholesterol is at goal. Goal: Total Cholesterol (CHOL) less than 160 Goal:  Triglycerides (TRIG) less than 150 Goal:  HDL greater than 40 Goal:  LDL (LDLCALC) less than 100   BLOOD PRESSURE American Stroke Association blood pressure target is less that 120/80 mm/Hg  Your discharge blood pressure is:  BP: (!) 166/80 Monitor your blood pressure Limit your salt and alcohol intake Many individuals will require more than one medication for high blood pressure  DIABETES (A1c is a blood sugar average for last 3 months) Goal HGBA1c is under 7% (HBGA1c is blood sugar average for last 3 months)  Diabetes: No known diagnosis of diabetes    Lab Results  Component Value Date   HGBA1C 5.4 02/27/2021    Your HGBA1c can be lowered with medications, healthy diet, and exercise. Check your blood sugar as directed by your physician Call your physician if you experience unexplained or low blood sugars.  PHYSICAL ACTIVITY/REHABILITATION Goal is 30 minutes at least 4 days per week  Activity: Increase activity slowly, Therapies: Physical Therapy: Home Health Return to work:  Activity decreases your risk of heart attack and stroke and makes your heart  stronger.  It helps control your weight and blood pressure; helps you relax and can improve your mood. Participate in a regular exercise program. Talk with your doctor about the best form of exercise for you (dancing, walking, swimming, cycling).  DIET/WEIGHT Goal is to maintain a healthy weight  Your discharge diet is:  Diet Order             DIET - DYS 1 Room service appropriate? Yes with Assist; Fluid consistency: Nectar Thick  Diet effective now                   liquids Your height is:  Height: 5\' 2"  (157.5 cm) Your current weight is: Weight: 68.2 kg Your Body Mass Index (BMI) is:  BMI (Calculated): 27.49 Following the type of diet specifically designed for you will help prevent another  stroke. Your goal weight range is:   Your goal Body Mass Index (BMI) is 19-24. Healthy food habits can help reduce 3 risk factors for stroke:  High cholesterol, hypertension, and excess weight.  RESOURCES Stroke/Support Group:  Call [phone]   STROKE EDUCATION PROVIDED/REVIEWED AND GIVEN TO PATIENT Stroke warning signs and symptoms How to activate emergency medical system (call 911). Medications prescribed at discharge. Need for follow-up after discharge. Personal risk factors for stroke. Pneumonia vaccine given: No Flu vaccine given: No My questions have been answered, the writing is legible, and I understand these instructions.  I will adhere to these goals & educational materials that have been provided to me after my discharge from the hospital.      My questions have been answered and I understand these instructions. I will adhere to these goals and the provided educational materials after my discharge from the hospital.  Patient/Caregiver Signature _______________________________ Date __________  Clinician Signature _______________________________________ Date __________  Please bring this form and your medication list with you to all your follow-up doctor's appointments.
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
## 2021-03-01 NOTE — Progress Notes (Signed)
Calorie Count Note  48 hour calorie count ordered.  Diet: dysphagia 1 with nectar thick liquids Supplements: Magic cup TID with meals, each supplement provides 290 kcal and 9 grams of protein; MVI with minerals daily  Pt working with therapy at time of visit. No envelope on door and no meal completions documented in chart. Noted meal tray unattempted in room.   Per chart review, plan to d/c to CIR. Updated RD at [facility] regarding pt's nutrition care plan.   Loistine Chance, RD, LDN, [place] Registered Dietitian II Certified Diabetes Care and Education Specialist Please refer to [facility] for RD and/or RD on-call/weekend/after hours pager
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
## 2021-03-01 NOTE — Progress Notes (Signed)
[facility] IP rehab admissions - I have approval for inpatient rehab admission today at [facility].  Attending MD has cleared patient for admission.  I will admit today.  Call for questions.  [phone]
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
## 2021-03-01 NOTE — Discharge Summary (Signed)
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 volume on perfusion appears similar in extent to infarct on noncontrast head CT. No hemodynamically significant stenosis in the neck. Preliminary results were communicated to Dr. Quinn Axe at 4:12 pm on 02/26/2021 by text page via the [facility] messaging system. Electronically Signed   By: Macy Mis M.D.   On: 02/26/2021 16:26    Microbiology: Recent Results (from the past 240 hour(s))  Resp Panel by RT-PCR (Flu A&B, Covid) Nasopharyngeal Swab     Status: None   Collection Time: 02/26/21 11:30 AM   Specimen: Nasopharyngeal Swab; Nasopharyngeal(NP) swabs in vial transport medium  Result Value Ref Range Status   SARS Coronavirus 2 by RT PCR NEGATIVE NEGATIVE Final    Comment: (NOTE) SARS-CoV-2 target nucleic acids are NOT DETECTED.  The SARS-CoV-2 RNA is generally detectable in upper respiratory specimens during the acute phase of infection. The lowest concentration of SARS-CoV-2 viral copies this assay can detect is 138 copies/mL. A negative result does not preclude SARS-Cov-2 infection and should not be used as the sole basis for treatment or other patient management decisions. A negative  result may occur with  improper specimen collection/handling, submission of specimen other than nasopharyngeal swab, presence of viral mutation(s) within the areas targeted by this assay, and inadequate number of viral copies(<138 copies/mL). A negative result must be combined with clinical observations, patient history, and epidemiological information. The expected result is Negative.  Fact Sheet for Patients:  EntrepreneurPulse.com.au  Fact Sheet for Healthcare Providers:  IncredibleEmployment.be  This test is no t yet approved or cleared by the [place] FDA and  has been authorized for detection and/or diagnosis of SARS-CoV-2 by FDA under an Emergency Use Authorization (EUA). This EUA will remain  in effect (meaning this test can be used) for the duration of the COVID-19 declaration under Section 564(b)(1) of the Act, 21 U.S.C.section 360bbb-3(b)(1), unless the authorization is terminated  or revoked sooner.       Influenza A by PCR NEGATIVE NEGATIVE Final   Influenza B by PCR NEGATIVE NEGATIVE Final    Comment: (NOTE) The Xpert Xpress SARS-CoV-2/FLU/RSV plus assay is intended as an aid in the diagnosis of influenza from Nasopharyngeal swab specimens and should not be used as a sole basis for treatment. Nasal washings and aspirates are unacceptable for Xpert Xpress SARS-CoV-2/FLU/RSV testing.  Fact Sheet for Patients: EntrepreneurPulse.com.au  Fact Sheet for Healthcare Providers: IncredibleEmployment.be  This test is not yet approved or cleared by the [place] FDA and has been authorized for detection and/or diagnosis of SARS-CoV-2 by FDA under an Emergency Use Authorization (EUA). This EUA will remain in effect (meaning this test can be used) for the duration of the COVID-19 declaration under Section 564(b)(1) of the Act, 21 U.S.C. section 360bbb-3(b)(1), unless the authorization is  terminated or revoked.  Performed at [facility], [address] [place], [place] [address]      Labs: CBC: Recent Labs  Lab 02/26/21 [phone] 02/28/21 0359 03/01/21 0416  WBC 4.6 6.7 7.8  NEUTROABS 2.6  --   --   HGB 13.3 13.2 12.5  HCT 39.4 37.9 35.6*  MCV 90.0 88.3 87.7  PLT 183 193 546   Basic Metabolic Panel: Recent Labs  Lab 02/26/21 0905 02/28/21 0359 03/01/21 0416  NA 140 140 139  K 4.1 3.4* 3.4*  CL 105 108 106  CO2 29 23 24   GLUCOSE 104* 94 94  BUN 25* 22 21  CREATININE 1.13* 1.08* 0.89  CALCIUM 9.2 9.0 8.7*  MG  --  2.2 2.1  PHOS  --  2.5 2.2*   Liver Function Tests: Recent Labs  Lab 02/26/21 0905  AST 30  ALT 23  ALKPHOS 68  BILITOT 0.6  PROT 8.0  ALBUMIN 3.9   No results for input(s): LIPASE, AMYLASE in the last 168 hours. No results for input(s): AMMONIA in the last 168 hours. Cardiac Enzymes: No results for input(s): CKTOTAL, CKMB, CKMBINDEX, TROPONINI in the last 168 hours. BNP (last 3 results) No results for input(s): BNP in the last 8760 hours. CBG: Recent Labs  Lab 02/26/21 0847  GLUCAP 78    Time spent: 35 minutes  Signed:  Val Riles  [facility]  03/01/2021 12:29 PM

## 2021-03-02 DIAGNOSIS — I63511 Cerebral infarction due to unspecified occlusion or stenosis of right middle cerebral artery: Secondary | ICD-10-CM | POA: Diagnosis not present

## 2021-03-02 LAB — CBC WITH DIFFERENTIAL/PLATELET
Abs Immature Granulocytes: 0.02 10*3/uL (ref 0.00–0.07)
Basophils Absolute: 0 10*3/uL (ref 0.0–0.1)
Basophils Relative: 1 %
Eosinophils Absolute: 0 10*3/uL (ref 0.0–0.5)
Eosinophils Relative: 1 %
HCT: 36.5 % (ref 36.0–46.0)
Hemoglobin: 13 g/dL (ref 12.0–15.0)
Immature Granulocytes: 0 %
Lymphocytes Relative: 19 %
Lymphs Abs: 1.5 10*3/uL (ref 0.7–4.0)
MCH: 30.9 pg (ref 26.0–34.0)
MCHC: 35.6 g/dL (ref 30.0–36.0)
MCV: 86.7 fL (ref 80.0–100.0)
Monocytes Absolute: 0.8 10*3/uL (ref 0.1–1.0)
Monocytes Relative: 10 %
Neutro Abs: 5.5 10*3/uL (ref 1.7–7.7)
Neutrophils Relative %: 69 %
Platelets: 170 10*3/uL (ref 150–400)
RBC: 4.21 MIL/uL (ref 3.87–5.11)
RDW: 12.2 % (ref 11.5–15.5)
WBC: 7.9 10*3/uL (ref 4.0–10.5)
nRBC: 0 % (ref 0.0–0.2)

## 2021-03-02 LAB — COMPREHENSIVE METABOLIC PANEL
ALT: 17 U/L (ref 0–44)
AST: 23 U/L (ref 15–41)
Albumin: 3 g/dL — ABNORMAL LOW (ref 3.5–5.0)
Alkaline Phosphatase: 64 U/L (ref 38–126)
Anion gap: 12 (ref 5–15)
BUN: 16 mg/dL (ref 8–23)
CO2: 26 mmol/L (ref 22–32)
Calcium: 8.7 mg/dL — ABNORMAL LOW (ref 8.9–10.3)
Chloride: 101 mmol/L (ref 98–111)
Creatinine, Ser: 1.07 mg/dL — ABNORMAL HIGH (ref 0.44–1.00)
GFR, Estimated: 53 mL/min — ABNORMAL LOW (ref >60)
Glucose, Bld: 113 mg/dL — ABNORMAL HIGH (ref 70–99)
Potassium: 3.1 mmol/L — ABNORMAL LOW (ref 3.5–5.1)
Sodium: 139 mmol/L (ref 135–145)
Total Bilirubin: 0.8 mg/dL (ref 0.3–1.2)
Total Protein: 6.8 g/dL (ref 6.5–8.1)

## 2021-03-02 MED ORDER — MAGNESIUM GLUCONATE 500 MG PO TABS
250.0000 mg | ORAL_TABLET | Freq: Every day | ORAL | Status: DC
  Administered 2021-03-02 – 2021-03-22 (×21): 250 mg via ORAL
  Filled 2021-03-02 (×21): qty 1

## 2021-03-02 MED ORDER — TIZANIDINE HCL 2 MG PO TABS
2.0000 mg | ORAL_TABLET | Freq: Every day | ORAL | Status: DC
  Administered 2021-03-02 – 2021-03-22 (×21): 2 mg via ORAL
  Filled 2021-03-02 (×21): qty 1

## 2021-03-02 MED ORDER — POTASSIUM CHLORIDE 20 MEQ PO PACK
40.0000 meq | PACK | Freq: Two times a day (BID) | ORAL | Status: DC
  Administered 2021-03-02 – 2021-03-13 (×23): 40 meq via ORAL
  Filled 2021-03-02 (×23): qty 2
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
## 2021-03-02 NOTE — Progress Notes (Signed)
Inpatient Rehabilitation Care Coordinator Assessment and Plan Patient Details  Name: Teresa [name] MRN: [id number] Date of Birth: 10/14/1940  Today's Date: 03/02/2021  Hospital Problems: Principal Problem:   Right middle cerebral artery stroke [facility])  Past Medical History:  Past Medical History:  Diagnosis Date   Cancer ([place])    skin   Hypothyroidism    Raynaud's disease    Past Surgical History:  Past Surgical History:  Procedure Laterality Date   ABDOMINAL HYSTERECTOMY  1991   APPENDECTOMY  1991   BACK SURGERY  2010   Cervical fusion C4-5-6-7   CATARACT EXTRACTION, BILATERAL Bilateral 10/2016   CHOLECYSTECTOMY  1995   COLONOSCOPY WITH PROPOFOL N/A 12/08/2018   Procedure: COLONOSCOPY WITH PROPOFOL;  Surgeon: Toledo, Benay Pike, MD;  Location: [facility] ENDOSCOPY;  Service: Gastroenterology;  Laterality: N/A;   EYE SURGERY     Social History:  reports that she has never smoked. She has never used smokeless tobacco. She reports that she does not drink alcohol and does not use drugs.  Family / Support Systems Marital Status: Married Patient Roles: Spouse, Parent, Other (Comment) (neighbor) Spouse/Significant Other: [name] [phone] Children: Jennifer-daughter [phone]  Tilman Neat [phone] Other Supports: Daughter in-law Anticipated Caregiver: [name] Ability/Limitations of Caregiver: Husband ca provide supervision level and family working on more assist Caregiver Availability: Other (Comment) (Aware will need 24/7 care at DC) Family Dynamics: Close knit family pt was very independent and active prior to admission. She was the [place] of the family. 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 She is not hapy being in this situation and hopes to do well here and not need much care at discharge. Recent Psychosocial Issues: other health issues-piror neck surgery and is having more pain now since CVA-MD aware Psychiatric History: No issues-history since so independent may benfit from seeing neuro-psych while here, will get input from team Substance Abuse History: No issues  Patient / Family Perceptions, Expectations & Goals Pt/Family understanding of illness & functional limitations: Pt and family can explain her stroke and deficits, she has struggled for the past few weeks and now knows reason why. All have spoken with the MD and feel they have a good understanding of the treatment plan moving forward. All are hopeful she will do well Premorbid pt/family roles/activities: Wife, Mom, grandmother, nieghbor, walker, church member, etc Anticipated changes in roles/activities/participation: resume Pt/family expectations/goals: Pt states: " I will be glad to get out of this bed I'm tired of it and this catheter also."  Husband states: "  We hope she does well here and will make good progress."  [employer]: None Premorbid Home Care/DME Agencies: None Transportation available at discharge: Husband and daughter Is the patient able to respond to transportation needs?: Yes In the past 12 months, has lack of transportation kept you from medical appointments or from getting medications?: No In the past 12 months, has lack of transportation kept you from meetings, work, or from getting things needed for daily living?: No Resource referrals recommended: Neuropsychology  Discharge Planning Living Arrangements: Spouse/significant other Support Systems: Spouse/significant other, Children, Water engineer, Social worker community Type of Residence: Private residence Insurance underwriter Resources: Multimedia programmer (specify) ([place] Medicare) Financial Resources: Social Security, Family Support Financial Screen Referred: No Living Expenses: Own Money Management: Patient, Spouse Does the patient have any problems obtaining your medications?: No Home Management: Pt husband also helps Patient/Family Preliminary Plans: Return home with husband and if needed other fmaily members helping or hired assist. Being evaluated today and goals being set, will work on safe discharge plan. Family will be here daily and provide support. Care Coordinator Barriers to Discharge: Insurance for SNF coverage Care Coordinator Anticipated Follow Up Needs: HH/OP  Clinical Impression  Pleasant motivated female who is willing to work hard in therapies to regain her independence. Her husband and two children are involved and all aware she may require 24/7 physical assist at discharge and to begin working on this. Husband can provide supervision level. Aware with her insurance she can not go to a SNF from here since came to CIR. Await therapy evaluations and work on discharge plan  Elease Hashimoto 03/02/2021, 11:04 AM

## 2021-03-02 NOTE — Progress Notes (Signed)
Occupational Therapy Session Note  Patient Details  Name: Teresa [name] MRN: [id number] Date of Birth: [birth year]  Today's Date: 03/03/2021 OT Individual Time: 1001-1058 OT Individual Time Calculation (min): 57 min    Short Term Goals: Week 1:  OT Short Term Goal 1 (Week 1): Pt will don shirt with minA while maintaining balance EOB with min A OT Short Term Goal 2 (Week 1): Pt will transfer to toilet/ BSC with mod A consistently OT Short Term Goal 3 (Week 1): Pt will locate items on left side in ADL tasks with mod cues OT Short Term Goal 4 (Week 1): Pt will perform sit to stands for toileting tasks with mod A  Skilled Therapeutic Interventions/Progress Updates:  Pt greeted in the bed with min c/o neck pain, per pt premedicated. She stated that she wanted to start session by brushing her teeth, opting to wait until daughter arrived to change clothes. Max A for supine<sit with pt initiating trunk elevation. While EOB, worked on sitting balance/core strengthening and also balance awareness, pt mostly requiring CGA-close supervision for balance assistance. Hand over hand to incorporate the Lt hand during oral care and face washing using her personal facial cream. Max multimodal cues for locating needed items on her Lt side, pt with very limited cervical ROM, not able to turn neck towards the Lt side. Worked on pre functional reach skills, her Lt arm on the table and pt weight shifting forward/back, also with ROM using a pillowcase on tabletop, pt with very minimal shoulder activation with gravity minimized. Min A for sit<stand in [place]. Worked on Lt sided weightbearing/strengthening using Stedy including lateral weight shifting and small "push ups" using the Stedy bar with her Rt hand behind her back (completed while semi perched in [place] and also in full standing). Note that pt needed the knee block on her Lt due to active knee buckling throughout activity. She required a few seated rest breaks due  to fatigue. At end of session she returned to bed, able to bridge to assist OT with chuck pad positioning, OT assisting with foot position. She remained in bed at close of session, all needs within reach and bed alarm set. Tx focus placed on Lt NMR, general strengthening, balance, and Lt attention to maximize functional independence during self care tasks.   Therapy Documentation Precautions:  Precautions Precautions: Fall Precaution Comments: L hemipareisis, L neglect/ inattention Restrictions Weight Bearing Restrictions: No Other Position/Activity Restrictions: shoulder sling for comfort with mobility -- only if it doesn't bother her R cervical spine  ADL: ADL Where Assessed-Eating: Edge of bed Grooming: Moderate assistance Where Assessed-Grooming: Wheelchair Upper Body Bathing: Maximal assistance Where Assessed-Upper Body Bathing: Edge of bed Lower Body Bathing: Maximal assistance Where Assessed-Lower Body Bathing: Edge of bed Upper Body Dressing: Maximal assistance Where Assessed-Upper Body Dressing: Edge of bed Lower Body Dressing: Maximal assistance Where Assessed-Lower Body Dressing: Edge of bed Toileting: Maximal assistance Toilet Transfer: Maximal assistance   Therapy/Group: Individual Therapy  Alleen Kehm A Doyl Bitting 03/03/2021, 1:32 PM
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
## 2021-03-02 NOTE — Progress Notes (Signed)
PROGRESS NOTE   Subjective/Complaints: Teresa [name] finds the foley annoying, discussed trial of void today and she would like this, discussed cath if unable to void, importance of retraining bladded C/o neck pain  ROS: +neck pain   Objective:   DG Swallowing Func-Speech Pathology  Result Date: 02/28/2021 Table formatting from the original result was not included. SLP Summary report below; please see Notes section for complete report Assessment / Plan / Recommendation   Clinical Impressions 02/28/2021 Clinical Impression Patient presents with moderate oropharyngeal dysphagia with sensory and motor impairments. Oral stage is characterized by impaired lip closure with significant left anterior bolus loss, particularly when self-feeding from cup without assistance. Bolus preparation/lingual manipulation is sluggish and there is mild-moderate diffuse oral residue post-swallow; improved clearance with smaller boluses of thicker consistencies (nectar, honey, puree). Pt has reduced sensation/awareness, needing max A to take small sips. Premature spillage occurs with liquids, resulting in penetration/ aspiration before (thin; sensate) and during/after the swallow (nectar; silent) without modifications. Reduced base of tongue retraction results in moderate residue (valleculae, pyriform sinuses) when no compensations used. With head turn left and slight chin tuck, pt demonstrated good airway protection with nectar, honey, puree and mechanical soft solids, as well as improved pharyngeal clearance. Cervical esophageal phase is noted for mildly reduced amplitude of pharyngoesophageal segment opening; there is min-mod retention in PE segment which may be baseline secondary to ACDF hardware. Recommend initiating dysphagia 1 (puree) and nectar thick liquids, meds crushed with full supervision/ assistance with self-feeding. Pt should use left head turn and chin  tuck for all swallows, complete a clearing swallow to reduce oral residue. Given cognitive and sensory deficits; pt will need assistance for rate control, small bites/sips, monitoring for pocketing and anterior loss. If pt unable to maintain adequate means of nutrition by mouth, MD may wish to consider supplemental alternative means of nutrition.   SLP Visit Diagnosis Dysphagia, oropharyngeal phase (R13.12) Impact on safety and function Moderate aspiration risk;Risk for inadequate nutrition/hydration  Teresa Lever, MS, CCC-SLP Speech-Language Pathologist   Recent Labs    03/01/21 1505 03/02/21 0502  WBC 8.6 7.9  HGB 12.3 13.0  HCT 35.6* 36.5  PLT 167 170   Recent Labs    03/01/21 0416 03/01/21 1505 03/02/21 0502  NA 139  --  139  K 3.4*  --  3.1*  CL 106  --  101  CO2 24  --  26  GLUCOSE 94  --  113*  BUN 21  --  16  CREATININE 0.89 0.95 1.07*  CALCIUM 8.7*  --  8.7*    Intake/Output Summary (Last 24 hours) at 03/02/2021 1029 Last data filed at 03/02/2021 0750 Gross per 24 hour  Intake 480 ml  Output 900 ml  Net -420 ml        Physical Exam: Vital Signs Blood pressure (!) 183/98, pulse 76, temperature 97.7 F (36.5 C), temperature source Oral, resp. rate 18, height 5\' 2"  (1.575 m), weight 68.2 kg, SpO2 97 %. Gen: no distress, normal appearing HEENT: oral mucosa pink and moist, R gaze preference; however with multiple cues, could look to left.   Cardio: Reg rate Chest: normal effort, normal  rate of breathing Abdominal:     Comments: Soft, NT, ND, (+)BS- normoactive  Genitourinary:    Comments: Foley- medium amber urine in bag Musculoskeletal:     Cervical back: Normal range of motion. No rigidity.     Comments: RUE/RLE 5/5 LUE- 0/5- biceps, triceps, WE, grip and finger abd LLE 2-/5 in HF.KE.DF and PF  Skin:    General: Skin is warm and dry.     Comments: Buttocks no wounds 2 spots on back of thighs- scratch and birth mark Heels OK- no bogginess Squeezing  abrasion of R upper arm from BP cuff  Neurological:     Comments: Patient is alert.  Moderate dysarthria.  Left noted facial droop.  She is able to name objects.  Follows simple commands Says light touch intact in all 4 extremities, but has L inattention Dysarthric-   Psychiatric:     Comments: Extremely FLAT     Assessment/Plan: 1. Functional deficits which require 3+ hours per day of interdisciplinary therapy in a comprehensive inpatient rehab setting. Physiatrist is providing close team supervision and 24 hour management of active medical problems listed below. Physiatrist and rehab team continue to assess barriers to discharge/monitor patient progress toward functional and medical goals  Care Tool:  Bathing        Body parts bathed by helper: Face     Bathing assist Assist Level: Minimal Assistance - Patient > 75%     Upper Body Dressing/Undressing Upper body dressing   What is the patient wearing?: Hospital gown only    Upper body assist Assist Level: Moderate Assistance - Patient 50 - 74%    Lower Body Dressing/Undressing Lower body dressing      What is the patient wearing?: Hospital gown only     Lower body assist Assist for lower body dressing: Moderate Assistance - Patient 50 - 74%     Toileting Toileting    Toileting assist Assist for toileting: Moderate Assistance - Patient 50 - 74%     Transfers Chair/bed transfer  Transfers assist           Locomotion Ambulation   Ambulation assist              Walk 10 feet activity   Assist           Walk 50 feet activity   Assist           Walk 150 feet activity   Assist           Walk 10 feet on uneven surface  activity   Assist           Wheelchair     Assist Is the patient using a wheelchair?: No             Wheelchair 50 feet with 2 turns activity    Assist            Wheelchair 150 feet activity     Assist          Blood  pressure (!) 183/98, pulse 76, temperature 97.7 F (36.5 C), temperature source Oral, resp. rate 18, height 5\' 2"  (1.575 m), weight 68.2 kg, SpO2 97 %.  Medical Problem List and Plan: 1. 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  Antithrombotics: -DVT/anticoagulation:  Pharmaceutical: Lovenox             -antiplatelet therapy: Aspirin 81 mg daily and Plavix 75 mg day x3 weeks then Plavix alone 3. Neck pain: kpad added. Continue muscle rub and tylenol prn.  4. Mood: Provide emotional support             -antipsychotic agents: N/A 5. Neuropsych: This patient is capable of making decisions on his own behalf. 6. Skin/Wound Care: Routine skin checks 7. Fluids/Electrolytes/Nutrition: Routine in and outs with follow-up chemistries 8.  Dysphagia.  Dysphagia #1 nectar liquids.  Follow-up speech therapy- might need IVFs at night since on nectar thick liquids- check labs in AM 9.  Hypertension.  Cozaar 75 mg daily.  Monitor with increased mobility. Start tizanidine at night which will also help her sleep.  10.  Hyperlipidemia.  Crestor 11.  Hypothyroidism.  Synthroid 12.  Urinary retention.  d/c foley today. Cannot use flomax due to sulfa allergy 13.  AKI.  Baseline creatinine 1.0-1.4. Follow-up chemistries- esp in light of nectar thick liquids. Placed nursing order to encourage 6-8 glasses of water per day. Repeat tomorrow.  14.  Obesity.  BMI 26.52.  Dietary follow-up   LOS: 1 days A FACE TO FACE EVALUATION WAS PERFORMED  Martha Clan P Ailynn Gow 03/02/2021, 10:29 AM

## 2021-03-02 NOTE — Progress Notes (Signed)
Patient ID: Teresa [name], female   DOB: [birth year], 80 y.o.   MRN: [id number] Met with the patient to introduce self, and role. Discussed rehab routine, team schedule and plan of care. Patient reporting neck pain worse since stroke. Discussed secondary risks including HTN, and HLD (LDL 85). Patient noted history of hypothroidism, Raynauds and awareness of medications. Urinary retention post stroke;anticipate removal of foley today. Reviewed toilet protocol with the patient. Continue to follow to discharge to address educational needs and collaborate with the team to facilitate preparation for discharge home with spouse. Margarito Liner

## 2021-03-02 NOTE — Progress Notes (Signed)
[facility] Individual Statement of Services  Patient Name:  Teresa [name]  Date:  03/02/2021  Welcome to the [facility].  Our goal is to provide you with an individualized program based on your diagnosis and situation, designed to meet your specific needs.  With this comprehensive rehabilitation program, you will be expected to participate in at least 3 hours of rehabilitation therapies Monday-Friday, with modified therapy programming on the weekends.  Your rehabilitation program will include the following services:  Physical Therapy (PT), Occupational Therapy (OT), Speech Therapy (ST), 24 hour per day rehabilitation nursing, Neuropsychology, Care Coordinator, Rehabilitation Medicine, Nutrition Services, and Pharmacy Services  Weekly team conferences will be held on Wednesday to discuss your progress.  Your Inpatient Rehabilitation Care Coordinator will talk with you frequently to get your input and to update you on team discussions.  Team conferences with you and your family in attendance may also be held.  Expected length of stay: 18-21 days  Overall anticipated outcome: Over all min assist level  Depending on your progress and recovery, your program may change. Your Inpatient Rehabilitation Care Coordinator will coordinate services and will keep you informed of any changes. Your Inpatient Rehabilitation Care Coordinator's name and contact numbers are listed  below.  The following services may also be recommended but are not provided by the [facility] will be made to provide these services after discharge if needed.  Arrangements include referral to agencies that provide these services.  Your insurance has been verified to be:  [employer] Your primary doctor is:  Press photographer  Pertinent information will be  shared with your doctor and your insurance company.  Inpatient Rehabilitation Care Coordinator:  Ovidio Kin, [place] or Emilia Beck  Information discussed with and copy given to patient by: Elease Hashimoto, 03/02/2021, 11:07 AM

## 2021-03-02 NOTE — Progress Notes (Signed)
Inpatient Rehabilitation  Patient information reviewed and entered into eRehab system by Euphemia Lingerfelt M. Biff Rutigliano, M.A., CCC/SLP, PPS Coordinator.  Information including medical coding, functional ability and quality indicators will be reviewed and updated through discharge.    
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
## 2021-03-02 NOTE — Plan of Care (Signed)
Problem: RH Balance Goal: LTG: Patient will maintain dynamic sitting balance (OT) Description: LTG:  Patient will maintain dynamic sitting balance with assistance during activities of daily living (OT) Flowsheets (Taken 03/02/2021 1523) LTG: Pt will maintain dynamic sitting balance during ADLs with: Supervision/Verbal cueing Goal: LTG Patient will maintain dynamic standing with ADLs (OT) Description: LTG:  Patient will maintain dynamic standing balance with assist during activities of daily living (OT)  Flowsheets (Taken 03/02/2021 1523) LTG: Pt will maintain dynamic standing balance during ADLs with: Minimal Assistance - Patient > 75%   Problem: Sit to Stand Goal: LTG:  Patient will perform sit to stand in prep for activites of daily living with assistance level (OT) Description: LTG:  Patient will perform sit to stand in prep for activites of daily living with assistance level (OT) Flowsheets (Taken 03/02/2021 1523) LTG: PT will perform sit to stand in prep for activites of daily living with assistance level: Minimal Assistance - Patient > 75%   Problem: RH Grooming Goal: LTG Patient will perform grooming w/assist,cues/equip (OT) Description: LTG: Patient will perform grooming with assist, with/without cues using equipment (OT) Flowsheets (Taken 03/02/2021 1523) LTG: Pt will perform grooming with assistance level of: Supervision/Verbal cueing   Problem: RH Bathing Goal: LTG Patient will bathe all body parts with assist levels (OT) Description: LTG: Patient will bathe all body parts with assist levels (OT) Flowsheets (Taken 03/02/2021 1523) LTG: Pt will perform bathing with assistance level/cueing: Minimal Assistance - Patient > 75%   Problem: RH Dressing Goal: LTG Patient will perform upper body dressing (OT) Description: LTG Patient will perform upper body dressing with assist, with/without cues (OT). Flowsheets (Taken 03/02/2021 1523) LTG: Pt will perform upper body dressing with  assistance level of: Supervision/Verbal cueing Goal: LTG Patient will perform lower body dressing w/assist (OT) Description: LTG: Patient will perform lower body dressing with assist, with/without cues in positioning using equipment (OT) Flowsheets (Taken 03/02/2021 1523) LTG: Pt will perform lower body dressing with assistance level of: Minimal Assistance - Patient > 75%   Problem: RH Toileting Goal: LTG Patient will perform toileting task (3/3 steps) with assistance level (OT) Description: LTG: Patient will perform toileting task (3/3 steps) with assistance level (OT)  Flowsheets (Taken 03/02/2021 1523) LTG: Pt will perform toileting task (3/3 steps) with assistance level: Minimal Assistance - Patient > 75%   Problem: RH Vision Goal: RH LTG Vision Theme park manager) Flowsheets (Taken 03/02/2021 1523) LTG: Vision Goals: Pt will attend to left side of body and environment in ADL task with min cues   Problem: RH Functional Use of Upper Extremity Goal: LTG Patient will use RT/LT upper extremity as a (OT) Description: LTG: Patient will use right/left upper extremity as a stabilizer/gross assist/diminished/nondominant/dominant level with assist, with/without cues during functional activity (OT) Flowsheets (Taken 03/02/2021 1523) LTG: Use of upper extremity in functional activities: LUE as a stabilizer LTG: Pt will use upper extremity in functional activity with assistance level of: Supervision/Verbal cueing   Problem: RH Toilet Transfers Goal: LTG Patient will perform toilet transfers w/assist (OT) Description: LTG: Patient will perform toilet transfers with assist, with/without cues using equipment (OT) Flowsheets (Taken 03/02/2021 1523) LTG: Pt will perform toilet transfers with assistance level of: Minimal Assistance - Patient > 75%   Problem: RH Tub/Shower Transfers Goal: LTG Patient will perform tub/shower transfers w/assist (OT) Description: LTG: Patient will perform tub/shower transfers with  assist, with/without cues using equipment (OT) Flowsheets (Taken 03/02/2021 1523) LTG: Pt will perform tub/shower stall transfers with assistance level of: Minimal  Assistance - Patient > 75%   Problem: RH Memory Goal: LTG Patient will demonstrate ability for day to day recall/carry over during activities of daily living with assistance level (OT) Description: LTG:  Patient will demonstrate ability for day to day recall/carry over during activities of daily living with assistance level (OT). Flowsheets (Taken 03/02/2021 1523) LTG:  Patient will demonstrate ability for day to day recall/carry over during activities of daily living with assistance level (OT): Supervision   Problem: RH Attention Goal: LTG Patient will demonstrate this level of attention during functional activites (OT) Description: LTG:  Patient will demonstrate this level of attention during functional activites  (OT) Flowsheets (Taken 03/02/2021 1523) Patient will demonstrate this level of attention during functional activites: Selective Patient will demonstrate above attention level in the following environment: Home LTG: Patient will demonstrate this level of attention during functional activites (OT): Supervision   Problem: RH Awareness Goal: LTG: Patient will demonstrate awareness during functional activites type of (OT) Description: LTG: Patient will demonstrate awareness during functional activites type of (OT) Flowsheets (Taken 03/02/2021 1523) Patient will demonstrate awareness during functional activites type of: Emergent LTG: Patient will demonstrate awareness during functional activites type of (OT): Supervision

## 2021-03-03 DIAGNOSIS — I63511 Cerebral infarction due to unspecified occlusion or stenosis of right middle cerebral artery: Secondary | ICD-10-CM | POA: Diagnosis not present

## 2021-03-03 LAB — BASIC METABOLIC PANEL
Anion gap: 9 (ref 5–15)
BUN: 17 mg/dL (ref 8–23)
CO2: 27 mmol/L (ref 22–32)
Calcium: 8.5 mg/dL — ABNORMAL LOW (ref 8.9–10.3)
Chloride: 102 mmol/L (ref 98–111)
Creatinine, Ser: 0.94 mg/dL (ref 0.44–1.00)
GFR, Estimated: >60 mL/min (ref >60)
Glucose, Bld: 113 mg/dL — ABNORMAL HIGH (ref 70–99)
Potassium: 3.6 mmol/L (ref 3.5–5.1)
Sodium: 138 mmol/L (ref 135–145)

## 2021-03-03 MED ORDER — POLYVINYL ALCOHOL 1.4 % OP SOLN
2.0000 [drp] | OPHTHALMIC | Status: DC | PRN
  Administered 2021-03-04 – 2021-03-08 (×3): 2 [drp] via OPHTHALMIC
  Filled 2021-03-03 (×2): qty 15

## 2021-03-03 MED ORDER — BETHANECHOL CHLORIDE 10 MG PO TABS
5.0000 mg | ORAL_TABLET | Freq: Three times a day (TID) | ORAL | Status: DC
Start: 2021-03-03 — End: 2021-03-08
  Administered 2021-03-03 – 2021-03-08 (×15): 5 mg via ORAL
  Filled 2021-03-03 (×14): qty 1

## 2021-03-03 NOTE — Progress Notes (Signed)
Physical Therapy Session Note  Patient Details  Name: Teresa [name] MRN: [id number] Date of Birth: 10/13/1940  Today's Date: 03/03/2021 PT Individual Time: 0803-0914 PT Individual Time Calculation (min): 71 min   Short Term Goals: Week 1:     Skilled Therapeutic Interventions/Progress Updates:  Pt received supine in bed, reported slight back ache and neck ache but unable to provide numerical value for pain. Nursing present to administer pain meds and offered repositioning, light stretch and modalities throughout session for pain modulation. Emphasis of session on transfers, L NMR and gait training. Pt performed supine <>sit EOB w/mod A for LLE management and trunk support. PT able to sit EOB w/min A for L trunk lean correction. Squat pivot from bed to [facility] on L side w/ max A for L hemiplegia and tactile cues for weight shifting. Stand <>sit on BSC w/mod A and pt voided continently, unable to remove brief completely prior to pt voiding and having BM. Sit <>stand to [facility] w/ mod A and performed peri care w/total A, mod-max A for static standing balance in Stedy w/LUE tucked into gait belt. Used stedy to transport pt from [facility] to EOB and sit <>stand <>sit w/mod A x2 for poor eccentric control and trunk support. Threaded pt's pants through BLEs w/max A while NT supported pt sitting EOB 2/2 fatigue. Sit <>stand w/max A x2 and finished donning pants w/total A. Stand <>sit w/mod A x2 and sit <>supine w/mod A x2. Pt was left supine in bed, all needs in reach.   Therapy Documentation Precautions:  Precautions Precautions: Fall Precaution Comments: L hemipareisis, L neglect/ inattention Restrictions Weight Bearing Restrictions: No Other Position/Activity Restrictions: shoulder sling for comfort with mobility -- only if it doesn't bother her R cervical spine   Therapy/Group: Individual Therapy Cruzita Lederer Aaron Boeh, PT, DPT  03/03/2021, 7:44 AM

## 2021-03-03 NOTE — Plan of Care (Signed)
Problem: Sit to Stand Goal: LTG:  Patient will perform sit to stand with assistance level (PT) Description: LTG:  Patient will perform sit to stand with assistance level (PT) Flowsheets (Taken 03/02/2021 1631) LTG: PT will perform sit to stand in preparation for functional mobility with assistance level: Supervision/Verbal cueing   Problem: RH Balance Goal: LTG Patient will maintain dynamic sitting balance (PT) Description: LTG:  Patient will maintain dynamic sitting balance with assistance during mobility activities (PT) Flowsheets (Taken 03/02/2021 1631) LTG: Pt will maintain dynamic sitting balance during mobility activities with:: Independent with assistive device  Goal: LTG Patient will maintain dynamic standing balance (PT) Description: LTG:  Patient will maintain dynamic standing balance with assistance during mobility activities (PT) Flowsheets (Taken 03/02/2021 1631) LTG: Pt will maintain dynamic standing balance during mobility activities with:: Contact Guard/Touching assist   Problem: RH Bed Mobility Goal: LTG Patient will perform bed mobility with assist (PT) Description: LTG: Patient will perform bed mobility with assistance, with/without cues (PT). Flowsheets (Taken 03/02/2021 1631) LTG: Pt will perform bed mobility with assistance level of: Supervision/Verbal cueing   Problem: RH Bed to Chair Transfers Goal: LTG Patient will perform bed/chair transfers w/assist (PT) Description: LTG: Patient will perform bed to chair transfers with assistance (PT). Flowsheets (Taken 03/02/2021 1631) LTG: Pt will perform Bed to Chair Transfers with assistance level: Contact Guard/Touching assist   Problem: RH Car Transfers Goal: LTG Patient will perform car transfers with assist (PT) Description: LTG: Patient will perform car transfers with assistance (PT). Flowsheets (Taken 03/02/2021 1631) LTG: Pt will perform car transfers with assist:: Contact Guard/Touching assist   Problem: RH  Furniture Transfers Goal: LTG Patient will perform furniture transfers w/assist (OT/PT) Description: LTG: Patient will perform furniture transfers  with assistance (OT/PT). Flowsheets (Taken 03/02/2021 1631) LTG: Pt will perform furniture transfers with assist:: Contact Guard/Touching assist   Problem: RH Ambulation Goal: LTG Patient will ambulate in controlled environment (PT) Description: LTG: Patient will ambulate in a controlled environment, # of feet with assistance (PT). Flowsheets (Taken 03/02/2021 1631) LTG: Pt will ambulate in controlled environ  assist needed:: Minimal Assistance - Patient > 75% LTG: Ambulation distance in controlled environment: 150 ft using LRAD Goal: LTG Patient will ambulate in home environment (PT) Description: LTG: Patient will ambulate in home environment, # of feet with assistance (PT). Flowsheets (Taken 03/02/2021 1631) LTG: Pt will ambulate in home environ  assist needed:: Contact Guard/Touching assist LTG: Ambulation distance in home environment: 50 ft using LRAD   Problem: RH Wheelchair Mobility Goal: LTG Patient will propel w/c in controlled environment (PT) Description: LTG: Patient will propel wheelchair in controlled environment, # of feet with assist (PT) Flowsheets (Taken 03/03/2021 0331) LTG: Pt will propel w/c in controlled environ  assist needed:: Supervision/Verbal cueing LTG: Propel w/c distance in controlled environment: 100 ft Goal: LTG Patient will propel w/c in home environment (PT) Description: LTG: Patient will propel wheelchair in home environment, # of feet with assistance (PT). Flowsheets (Taken 03/02/2021 1631) LTG: Pt will propel w/c in home environ  assist needed:: Supervision/Verbal cueing Distance: wheelchair distance in controlled environment: 100 LTG: Propel w/c distance in home environment: at least 50 ft   Problem: RH Stairs Goal: LTG Patient will ambulate up and down stairs w/assist (PT) Description: LTG: Patient will  ambulate up and down # of stairs with assistance (PT) Flowsheets (Taken 03/02/2021 1631) LTG: Pt will ambulate up/down stairs assist needed:: Contact Guard/Touching assist LTG: Pt will  ambulate up and down number of stairs: at  least one step using LRAD and/ or HR setup as per home environment

## 2021-03-03 NOTE — Progress Notes (Signed)
PROGRESS NOTE   Subjective/Complaints:  Pt reports mvoing LLE now- which is new.  Neck "killing her"- hasn't taken tylenol yet today- using heat pack- will get her kpad.  K+ 3.6 LBM last night.   ROS:  Pt denies SOB, abd pain, CP, N/V/C/D, and vision changes  (+) neck pain   Objective:   No results found. Recent Labs    03/01/21 1505 03/02/21 0502  WBC 8.6 7.9  HGB 12.3 13.0  HCT 35.6* 36.5  PLT 167 170   Recent Labs    03/02/21 0502 03/03/21 0520  NA 139 138  K 3.1* 3.6  CL 101 102  CO2 26 27  GLUCOSE 113* 113*  BUN 16 17  CREATININE 1.07* 0.94  CALCIUM 8.7* 8.5*    Intake/Output Summary (Last 24 hours) at 03/03/2021 1409 Last data filed at 03/03/2021 1200 Gross per 24 hour  Intake 460 ml  Output 900 ml  Net -440 ml        Physical Exam: Vital Signs Blood pressure (!) 147/73, pulse 75, temperature 98.5 F (36.9 C), resp. rate 18, height 5\' 2"  (1.575 m), weight 68.2 kg, SpO2 94 %.   General: awake, alert, appropriate, sitting up in bed; heat pack on neck; NT in room; NAD HENT: R gaze preference- can look Left with cues CV: regular rate; no JVD Pulmonary: CTA B/L; no W/R/R- good air movement GI: soft, NT, ND, (+)BS Psychiatric: appropriate; flat affect Neurological: alert  Musculoskeletal:     Cervical back: Normal range of motion. No rigidity.     Comments: RUE/RLE 5/5 LUE- 0/5- biceps, triceps, WE, grip and finger abd LLE 2-/5 in HF.KE.DF and PF- no significant change today  Skin:    General: Skin is warm and dry.     Comments: Buttocks no wounds 2 spots on back of thighs- scratch and birth mark Heels OK- no bogginess Squeezing abrasion of R upper arm from BP cuff  Neurological:     Comments: Patient is alert.  Moderate dysarthria.  Left noted facial droop.  She is able to name objects.  Follows simple commands Says light touch intact in all 4 extremities, but has L  inattention Dysarthric-   Psychiatric:     Comments: Extremely FLAT     Assessment/Plan: 1. Functional deficits which require 3+ hours per day of interdisciplinary therapy in a comprehensive inpatient rehab setting. Physiatrist is providing close team supervision and 24 hour management of active medical problems listed below. Physiatrist and rehab team continue to assess barriers to discharge/monitor patient progress toward functional and medical goals  Care Tool:  Bathing    Body parts bathed by patient: Face, Abdomen   Body parts bathed by helper: Right arm, Left arm, Chest, Front perineal area, Buttocks, Right upper leg, Left upper leg, Right lower leg, Left lower leg     Bathing assist Assist Level: Moderate Assistance - Patient 50 - 74%     Upper Body Dressing/Undressing Upper body dressing   What is the patient wearing?: Pull over shirt    Upper body assist Assist Level: Total Assistance - Patient < 25%    Lower Body Dressing/Undressing Lower body dressing  What is the patient wearing?: Pants, Incontinence brief     Lower body assist Assist for lower body dressing: Total Assistance - Patient < 25%     Toileting Toileting    Toileting assist Assist for toileting: Total Assistance - Patient < 25%     Transfers Chair/bed transfer  Transfers assist     Chair/bed transfer assist level: 2 Helpers     Locomotion Ambulation   Ambulation assist   Ambulation activity did not occur: Safety/medical concerns          Walk 10 feet activity   Assist  Walk 10 feet activity did not occur: Safety/medical concerns        Walk 50 feet activity   Assist Walk 50 feet with 2 turns activity did not occur: Safety/medical concerns         Walk 150 feet activity   Assist Walk 150 feet activity did not occur: Safety/medical concerns         Walk 10 feet on uneven surface  activity   Assist Walk 10 feet on uneven surfaces activity did not  occur: Safety/medical concerns         Wheelchair     Assist Is the patient using a wheelchair?: Yes Type of Wheelchair: Manual    Wheelchair assist level: Total Assistance - Patient < 25%, Dependent - Patient 0% Max wheelchair distance: 300 ft    Wheelchair 50 feet with 2 turns activity    Assist        Assist Level: Dependent - Patient 0%, Total Assistance - Patient < 25%   Wheelchair 150 feet activity     Assist      Assist Level: Total Assistance - Patient < 25%, Dependent - Patient 0%   Blood pressure (!) 147/73, pulse 75, temperature 98.5 F (36.9 C), resp. rate 18, height 5\' 2"  (1.575 m), weight 68.2 kg, SpO2 94 %.  Medical Problem List and Plan: 1. 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  Antithrombotics: -DVT/anticoagulation:  Pharmaceutical: Lovenox             -antiplatelet therapy: Aspirin 81 mg daily and Plavix 75 mg day x3 weeks then Plavix alone 3. Neck pain: kpad added. Continue muscle rub and tylenol prn.   12/10- ordered kpad and con't regimen per pt request- doesn't want anything different.  4. Mood: Provide emotional support             -antipsychotic agents: N/A 5. Neuropsych: This patient is capable of making decisions on his own behalf. 6. Skin/Wound Care: Routine skin checks 7. Fluids/Electrolytes/Nutrition: Routine in and outs with follow-up chemistries 8.  Dysphagia.  Dysphagia #1 nectar liquids.  Follow-up speech therapy- might need IVFs at night since on nectar thick liquids- check labs in AM  12/10 Cr 0.94 and BUN 17- doing OK- will recheck Mondays. 9.  Hypertension.  Cozaar 75 mg daily.  Monitor with increased mobility. Start tizanidine at night which will also help her sleep.  10.  Hyperlipidemia.  Crestor 11.  Hypothyroidism.  Synthroid 12.  Urinary retention.  d/c foley today. Cannot  use flomax due to sulfa allergy  12/10- Needing in/out caths- will start urecholine- 5 mg TID and monitor 13.  AKI.  Baseline creatinine  1.0-1.4. Follow-up chemistries- esp in light of nectar thick liquids. Placed nursing order to encourage 6-8 glasses of thickened water per day. Repeat tomorrow. 12/10- doing well Cr 0.94 and BUN 17- will follow weekly and prn  14.  Obesity.  BMI 26.52.  Dietary follow-up   LOS: 2 days A FACE TO FACE EVALUATION WAS PERFORMED  Djimon Lundstrom 03/03/2021, 2:09 PM

## 2021-03-03 NOTE — Progress Notes (Signed)
Physical Therapy Session Note  Patient Details  Name: Teresa [name] MRN: [id number] Date of Birth: [birth year]  Today's Date: 03/03/2021 PT Individual Time: 1405-1502 PT Individual Time Calculation (min): 57 min   Short Term Goals: Week 1:      Skilled Therapeutic Interventions/Progress Updates:    Pt received supine in bed with her husband and daughter present and pt agreeable to therapy session. Pt kicks L lower leg slightly up off bed excitedly reporting she has increased movement in it today! Donned TED hose, socks, and shoes with total assist for time management.   Supine>sitting L EOB, HOB partially elevated and using bedrail, with mod assist for L hemibody management and trunk upright - pt able to initiate moving L LE over towards EOB requiring only min assist for this with predominant assist for trunk upright.   R squat pivot EOB>w/c with light mod assist for lifting/pivoting hips and managing L UE during transfer - pt demos good anterior trunk lean with verbal cuing and good ability to clear hips with facilitation for rotating hips.  Transported to/from gym in w/c for time management and energy conservation.  L squat pivot w/c>EOM with light mod assist for lifting/pivoting hips and managing L UE safely - cuing for anterior trunk lean and pivoting hips.  Patient participated in [facility] and demonstrates increased fall risk as noted by score of 4/56.  (<36= high risk for falls, close to 100%; 37-45 significant >80%; 46-51 moderate >50%; 52-55 lower >25%).  Sit<>stands to/from mat with light mod assist for lifting/lowering and blocking/guarding L knee but no true buckling just a soft bend - demos progressively worsening L lean due to L knee slowly flexing due to fatigue. Progressed to pre-gait training via R LE forward/backwards stepping with B HHA and guarding L knee but not buckling on 1st round then requires more of a block during 2nd set due to fatigue - light min assist  for balance 2x8 reps.  Pt reports fatigue throughout session. R squat pivot EOM>w/c as described above. Transported back to room and pt requesting to return to bed. L squat pivot w/c>EOB light mod assist as above. Sit>supine, HOB elevated to decrease neck pain, with mod assist for L hemibody management. Pt educated on importance of getting OOB during the day and is agreeable for nursing assistance to get to w/c or recliner for dinner. Pt left supine in bed with needs in reach, family present, and bed alarm on.  Of note: Pt noted to have a hard lump, approximately the size of a quarter or a little larger, just proximal to the IV in her R forearm - notified Gwenette Greet, Therapist, sports. Also, pt requesting eye drops for "dry eye" with pt reporting she uses "[facility]" at home with family confirming this - MD notified.  Therapy Documentation Precautions:  Precautions Precautions: Fall Precaution Comments: L hemipareisis, L neglect/ inattention Restrictions Weight Bearing Restrictions: No Other Position/Activity Restrictions: shoulder sling for comfort with mobility -- only if it doesn't bother her R cervical spine   Pain:  Denies pain during session and reports Kpad heat pack is helping with her neck pain.   Balance: Standardized Balance Assessment Standardized Balance Assessment: Berg Balance Test Berg Balance Test Sit to Stand: Needs moderate or maximal assist to stand Standing Unsupported: Unable to stand 30 seconds unassisted Sitting with Back Unsupported but Feet Supported on Floor or Stool: Able to sit 2 minutes under supervision Stand to Sit: Needs assistance to sit Transfers: Needs one person  to assist Standing Unsupported with Eyes Closed: Needs help to keep from falling Standing Ubsupported with Feet Together: Needs help to attain position and unable to hold for 15 seconds From Standing, Reach Forward with Outstretched Arm: Loses balance while trying/requires external support From Standing  Position, Pick up Object from Floor: Unable to try/needs assist to keep balance From Standing Position, Turn to Look Behind Over each Shoulder: Needs assist to keep from losing balance and falling Turn 360 Degrees: Needs assistance while turning Standing Unsupported, Alternately Place Feet on Step/Stool: Needs assistance to keep from falling or unable to try Standing Unsupported, One Foot in Front: Loses balance while stepping or standing Standing on One Leg: Unable to try or needs assist to prevent fall Total Score: 4   Therapy/Group: Individual Therapy  Tawana Scale , PT, DPT, NCS, CSRS 03/03/2021, 1:05 PM

## 2021-03-04 MED ORDER — PANTOPRAZOLE SODIUM 40 MG PO TBEC
40.0000 mg | DELAYED_RELEASE_TABLET | Freq: Every day | ORAL | Status: DC
  Administered 2021-03-04 – 2021-03-16 (×13): 40 mg via ORAL
  Filled 2021-03-04 (×10): qty 1

## 2021-03-04 NOTE — Progress Notes (Signed)
Occupational Therapy Session Note  Patient Details  Name: Teresa [name] MRN: [id number] Date of Birth: [birth year]  Today's Date: 03/05/2021 OT Individual Time: 4650-3546 OT Individual Time Calculation (min): 56 min   Short Term Goals: Week 1:  OT Short Term Goal 1 (Week 1): Pt will don shirt with minA while maintaining balance EOB with min A OT Short Term Goal 2 (Week 1): Pt will transfer to toilet/ BSC with mod A consistently OT Short Term Goal 3 (Week 1): Pt will locate items on left side in ADL tasks with mod cues OT Short Term Goal 4 (Week 1): Pt will perform sit to stands for toileting tasks with mod A  Skilled Therapeutic Interventions/Progress Updates:    Pt greeted in the w/c, just finished lunch, requested to use the [facility] to urinate. CGA for sit<stand in [place] and then pt transferred to the [facility]. With increased time provided, pt unable to void bladder but did void bowels. While she voided, OT performed soft tissue massage and trigger release to upper traps/upper paraspinal muscles. Pt reported feeling relief in neck, exhibited slightly improved ROM towards the Lt but pt still unable to adequately rotate neck Lt of midline in large part due to neck muscle tightness. Worked on Lt attention and joint protection while sitting on BSC, cues for pt to retrieve her arm that slid off of her lap. Also worked on improving postural awareness and control, verbal and tactile cues for shoulder retraction and chin retraction. Manual cues for upright posture during sit<stands in [place], cues to "push through arms" for NMR. Note heavy reliance on the knee block of Stedy on her Lt side. Pt able to complete hygiene herself with Mod A seated though exhibited left lateral LOBs during dynamic sitting. She transferred to the w/c after where we blow-dried and brushed hair. Had pt sit unsupported in chair and exhibit shoulder retraction for more erect upright alignment. Pt brushed hair this way. Note that cervical  retraction is very difficult for her at this time. She remained sitting up at close of session, all needs within reach, safety belt fastened, and arm trough secured to the w/c.   Therapy Documentation Precautions:  Precautions Precautions: Fall Precaution Comments: L hemipareisis, L neglect/ inattention Restrictions Weight Bearing Restrictions: No Other Position/Activity Restrictions: shoulder sling for comfort with mobility -- only if it doesn't bother her R cervical spine  Pain: pt denies pain   ADL: ADL Where Assessed-Eating: Edge of bed Grooming: Moderate assistance Where Assessed-Grooming: Wheelchair Upper Body Bathing: Maximal assistance Where Assessed-Upper Body Bathing: Edge of bed Lower Body Bathing: Maximal assistance Where Assessed-Lower Body Bathing: Edge of bed Upper Body Dressing: Maximal assistance Where Assessed-Upper Body Dressing: Edge of bed Lower Body Dressing: Maximal assistance Where Assessed-Lower Body Dressing: Edge of bed Toileting: Maximal assistance Toilet Transfer: Maximal assistance  Therapy/Group: Individual Therapy  Tramar Brueckner A Hubert Derstine 03/05/2021, 3:40 PM

## 2021-03-04 NOTE — IPOC Note (Signed)
Overall Plan of Care [facility]) Patient Details Name: Rawan Riendeau MRN: [id number] DOB: 1940-09-14  Admitting Diagnosis: Right middle cerebral artery stroke [facility])  Hospital Problems: Principal Problem:   Right middle cerebral artery stroke [facility])     Functional Problem List: Nursing Bladder, Medication Management, Safety, Bowel, Endurance, Nutrition  PT Balance, Endurance, Motor, Pain, Perception, Safety, Sensory  OT Balance, Perception, Cognition, Safety, Edema, Endurance, Motor, Pain, Sensory, Vision  SLP Cognition, Perception, Safety, Motor  TR         Basic ADL's: OT Grooming, Bathing, Dressing, Toileting     Advanced  ADL's: OT       Transfers: PT Bed to Chair, Car, Furniture, [employer], Metallurgist: PT Ambulation, Emergency planning/management officer, Stairs     Additional Impairments: OT Fuctional Use of Upper Extremity  SLP Social Cognition, Swallowing   Social Interaction, Attention, Awareness, Memory, Problem Solving  TR      Anticipated Outcomes Item Anticipated Outcome  Self Feeding supervision  Swallowing  mod I   Basic self-care  min A  Toileting  min A   Bathroom Transfers min A  Bowel/Bladder  Manage bowel w mod I and bladder without assist  Transfers  CGA  Locomotion  CGA/ MinA  Communication  mod I  Cognition  supervision  Pain  N/A  Safety/Judgment  maintain safety w cues/reminders   Therapy Plan: PT Intensity: Minimum of 1-2 x/day ,45 to 90 minutes PT Frequency: 5 out of 7 days PT Duration Estimated Length of Stay: 18-21 days OT Intensity: Minimum of 1-2 x/day, 45 to 90 minutes OT Frequency: 5 out of 7 days OT Duration/Estimated Length of Stay: 3-4 weeks SLP Intensity: Minumum of 1-2 x/day, 30 to 90 minutes SLP Frequency: 3 to 5 out of 7 days SLP Duration/Estimated Length of Stay: 3-4 weeks   Due to the current state of emergency, patients may not be receiving their 3-hours of Medicare-mandated therapy.    Team Interventions: Nursing Interventions Bladder Management, Disease Management/Prevention, Medication Management, Discharge Planning, Dysphagia/Aspiration Precaution Training, Bowel Management, Patient/Family Education  PT interventions Ambulation/gait training, Community reintegration, DME/adaptive equipment instruction, Neuromuscular re-education, Psychosocial support, Stair training, UE/LE Strength taining/ROM, Wheelchair propulsion/positioning, UE/LE Coordination activities, Therapeutic Activities, Skin care/wound management, Pain management, Functional electrical stimulation, Training and development officer, Discharge planning, Cognitive remediation/compensation, Disease management/prevention, Functional mobility training, Patient/family education, Splinting/orthotics, Therapeutic Exercise, Visual/perceptual remediation/compensation  OT Interventions Balance/vestibular training, Discharge planning, Functional electrical stimulation, Pain management, Self Care/advanced ADL retraining, Therapeutic Activities, UE/LE Coordination activities, Cognitive remediation/compensation, Disease mangement/prevention, Functional mobility training, Patient/family education, Skin care/wound managment, Therapeutic Exercise, Community reintegration, DME/adaptive equipment instruction, Neuromuscular re-education, Psychosocial support, Splinting/orthotics, UE/LE Strength taining/ROM, Wheelchair propulsion/positioning, Visual/perceptual remediation/compensation  SLP Interventions Cognitive remediation/compensation, Dysphagia/aspiration precaution training, Environmental controls, Medication managment, Functional tasks, Patient/family education, Therapeutic Exercise, Speech/Language facilitation  TR Interventions    SW/CM Interventions Discharge Planning, Psychosocial Support, Patient/Family Education   Barriers to Discharge MD  Medical stability, Home enviroment access/loayout, Incontinence, Lack of/limited family support,  Weight, Weight bearing restrictions, and Nutritional means  Nursing Decreased caregiver support, Nutrition means, Neurogenic Bowel & Bladder, Home environment access/layout 2 level 1 ste main B/B with spouse; daughter to assist  PT Inaccessible home environment, Decreased caregiver support, Home environment access/layout, Incontinence, Lack of/limited family support, Insurance underwriter for SNF coverage    OT      SLP      SW Insurance for SNF coverage     Team Discharge Planning: Destination: PT-Home ,OT- Home , SLP-Home  Projected Follow-up: PT-Home health PT, OT-  Home health OT, Outpatient OT, SLP-24 hour supervision/assistance, Outpatient SLP Projected Equipment Needs: PT-To be determined, OT- To be determined, SLP-None recommended by SLP Equipment Details: PT- , OT-  Patient/family involved in discharge planning: PT- Patient, Family member/caregiver,  OT-Patient, Family member/caregiver, SLP-Patient, Family member/caregiver  MD ELOS: 3-4 weeks Medical Rehab Prognosis:  Fair Assessment: Pt is a 80 yr old female with hx of HTN, 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  Goals- min A to [place]    See Team Conference Notes for weekly updates to the plan of care

## 2021-03-05 DIAGNOSIS — I63511 Cerebral infarction due to unspecified occlusion or stenosis of right middle cerebral artery: Secondary | ICD-10-CM | POA: Diagnosis not present

## 2021-03-05 MED ORDER — DICLOFENAC SODIUM 1 % EX GEL
2.0000 g | Freq: Four times a day (QID) | CUTANEOUS | Status: DC
  Administered 2021-03-05 – 2021-03-23 (×68): 2 g via TOPICAL
  Filled 2021-03-05 (×2): qty 100

## 2021-03-05 NOTE — Progress Notes (Signed)
Occupational Therapy Session Note  Patient Details  Name: Teresa [name] MRN: [id number] Date of Birth: 1941/03/12  Today's Date: 03/05/2021 OT Individual Time: 1131-1157 OT Individual Time Calculation (min): 26 min    Short Term Goals: Week 1:  OT Short Term Goal 1 (Week 1): Pt will don shirt with minA while maintaining balance EOB with min A OT Short Term Goal 2 (Week 1): Pt will transfer to toilet/ BSC with mod A consistently OT Short Term Goal 3 (Week 1): Pt will locate items on left side in ADL tasks with mod cues OT Short Term Goal 4 (Week 1): Pt will perform sit to stands for toileting tasks with mod A   Skilled Therapeutic Interventions/Progress Updates:    Pt greeted at time of session sitting up in wheelchair agreeable to OT session saying "I have to use the bathroom" and no reports of pain throughout. Sit > stand in [place] CGA/Min throughout session and stedy wheelchair > commode (no BSC over top as this would not fit in tight space) and Mod A overall for clothing management but able to perform hygiene seated on commode. Pt needed extended time to use toilet including having water running 2/2 inability to go at first. Therapist assist managing LUE throughout ensuring its placement on stedy rail and encouraging its use throughout session. Standing in stedy to wash hands at sink. Pt needed up to Mod A in standing for dynamic in stedy during toileting. Set up in wheelchair with alarm on call bell in reach.   Therapy Documentation Precautions:  Precautions Precautions: Fall Precaution Comments: L hemipareisis, L neglect/ inattention Restrictions Weight Bearing Restrictions: No Other Position/Activity Restrictions: shoulder sling for comfort with mobility -- only if it doesn't bother her R cervical spine     Therapy/Group: Individual Therapy  Viona Gilmore 03/05/2021, 7:26 AM

## 2021-03-05 NOTE — Progress Notes (Signed)
Physical Therapy Session Note  Patient Details  Name: Teresa [name] MRN: [id number] Date of Birth: May 09, 1940  Today's Date: 03/05/2021 PT Individual Time: 9030-0923 PT Individual Time Calculation (min): 55 min   Short Term Goals: Week 1:  PT Short Term Goal 1 (Week 1): Pt will perform bed mobility with consistent MinA. PT Short Term Goal 2 (Week 1): Pt will perform sit<>stand transfers with consistent CGA. PT Short Term Goal 3 (Week 1): Pt will perform seat to seat transfers wiyh consistent MinA. PT Short Term Goal 4 (Week 1): Pt will initiate gait training. PT Short Term Goal 5 (Week 1): Pt will initiate w/c mobility training.  Skilled Therapeutic Interventions/Progress Updates:     Pt seated in w/c to start session - agreeable to PT tx with some mild reports of neck pain. Rest breaks, distraction, and mobility provided for pain management.   Transported downstairs to 2M rehab gym for time and energy conservation. Squat pivot transfer completed with modA from w/c to mat table. Able to sit unsupported at edge of mat with SBA. Completed repeated sit<>stands for active warm up with mirror for visual feedback to encourage equal weight bearing bilaterally - requiring minA for standing and modA for standing balance without UE support due to L lateral lean. We also worked on static standing balance and postural awareness/control for full upright standing due to flexed hips/trunk and L lateral lean.  Provided back of arm chair for unilateral support in standing which responded well to with minA for balance.   Worked on pre-gait training with forward/backward stepping with RLE and then with LLE - 4x5 reps, minA for both. Able to progress to sequential stepping with minA and back of arm chair for balance. Some motor planning deficits noted with stepping and lacks hip/knee flexion on L during swing, especially during backwards stepping. Frequent rest breaks required 2/2 fatigue.   During rest  breaks, discussed DC planning, home safety, family assist available, etc. Pt returned upstairs to her room where she remained seated in w/c with LUE supported in arm trough, safety belt alarm on, and all immediate needs within reach. Husband updated on pt's mobility during session.  Therapy Documentation Precautions:  Precautions Precautions: Fall Precaution Comments: L hemipareisis, L neglect/ inattention Restrictions Weight Bearing Restrictions: No Other Position/Activity Restrictions: shoulder sling for comfort with mobility -- only if it doesn't bother her R cervical spine General:     Therapy/Group: Individual Therapy  Alger Simons 03/05/2021, 7:46 AM

## 2021-03-05 NOTE — Progress Notes (Signed)
PROGRESS NOTE   Subjective/Complaints: She would like to shower today, I have messaged Horris Latino to see if we can help her shower today She is experiencing constant neck pain despite applying the heating pad all the time  ROS:  Pt denies SOB, abd pain, CP, N/V/C/D, and vision changes  (+) neck pain, +urinary incontinence   Objective:   No results found. No results for input(s): WBC, HGB, HCT, PLT in the last 72 hours.  Recent Labs    03/03/21 0520  NA 138  K 3.6  CL 102  CO2 27  GLUCOSE 113*  BUN 17  CREATININE 0.94  CALCIUM 8.5*    Intake/Output Summary (Last 24 hours) at 03/05/2021 1002 Last data filed at 03/04/2021 1156 Gross per 24 hour  Intake --  Output 250 ml  Net -250 ml        Physical Exam: Vital Signs Blood pressure 131/77, pulse 66, temperature 97.9 F (36.6 C), temperature source Oral, resp. rate 18, height 5\' 2"  (1.575 m), weight 68.2 kg, SpO2 98 %.   General: awake, alert, appropriate, sitting up in bed; heat pack on neck; NT in room; NAD HENT: R gaze preference- can look Left with cues CV: regular rate; no JVD Pulmonary: CTA B/L; no W/R/R- good air movement GI: soft, NT, ND, (+)BS Psychiatric: appropriate; flat affect Neurological: alert  Musculoskeletal:     Cervical back: Normal range of motion. No rigidity.     Comments: RUE/RLE 5/5 LUE- 0/5, WE, grip and finger abd, 2/5 EF and EE LLE 2-/5 in HF.KE.DF and PF- no significant change today  Skin:    General: Skin is warm and dry.     Comments: Buttocks no wounds 2 spots on back of thighs- scratch and birth mark Heels OK- no bogginess Squeezing abrasion of R upper arm from BP cuff  Neurological:     Comments: Patient is alert.  Moderate dysarthria.  Left noted facial droop.  She is able to name objects.  Follows simple commands Says light touch intact in all 4 extremities, but has L inattention Dysarthric-   Psychiatric:      Comments: Extremely FLAT     Assessment/Plan: 1. Functional deficits which require 3+ hours per day of interdisciplinary therapy in a comprehensive inpatient rehab setting. Physiatrist is providing close team supervision and 24 hour management of active medical problems listed below. Physiatrist and rehab team continue to assess barriers to discharge/monitor patient progress toward functional and medical goals  Care Tool:  Bathing    Body parts bathed by patient: Face, Abdomen   Body parts bathed by helper: Right arm, Left arm, Chest, Front perineal area, Buttocks, Right upper leg, Left upper leg, Right lower leg, Left lower leg     Bathing assist Assist Level: Moderate Assistance - Patient 50 - 74%     Upper Body Dressing/Undressing Upper body dressing   What is the patient wearing?: Pull over shirt    Upper body assist Assist Level: Total Assistance - Patient < 25%    Lower Body Dressing/Undressing Lower body dressing      What is the patient wearing?: Pants, Incontinence brief     Lower body assist  Assist for lower body dressing: Total Assistance - Patient < 25%     Toileting Toileting    Toileting assist Assist for toileting: Moderate Assistance - Patient 50 - 74%     Transfers Chair/bed transfer  Transfers assist     Chair/bed transfer assist level: 2 Helpers     Locomotion Ambulation   Ambulation assist   Ambulation activity did not occur: Safety/medical concerns          Walk 10 feet activity   Assist  Walk 10 feet activity did not occur: Safety/medical concerns        Walk 50 feet activity   Assist Walk 50 feet with 2 turns activity did not occur: Safety/medical concerns         Walk 150 feet activity   Assist Walk 150 feet activity did not occur: Safety/medical concerns         Walk 10 feet on uneven surface  activity   Assist Walk 10 feet on uneven surfaces activity did not occur: Safety/medical concerns          Wheelchair     Assist Is the patient using a wheelchair?: Yes Type of Wheelchair: Manual    Wheelchair assist level: Total Assistance - Patient < 25%, Dependent - Patient 0% Max wheelchair distance: 300 ft    Wheelchair 50 feet with 2 turns activity    Assist        Assist Level: Dependent - Patient 0%, Total Assistance - Patient < 25%   Wheelchair 150 feet activity     Assist      Assist Level: Total Assistance - Patient < 25%, Dependent - Patient 0%   Blood pressure 131/77, pulse 66, temperature 97.9 F (36.6 C), temperature source Oral, resp. rate 18, height 5\' 2"  (1.575 m), weight 68.2 kg, SpO2 98 %.  Medical Problem List and Plan: 1. 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  Antithrombotics: -DVT/anticoagulation:  Pharmaceutical: Lovenox             -antiplatelet therapy: Aspirin 81 mg daily and Plavix 75 mg day x3 weeks then Plavix alone 3. Neck pain: kpad added. Continue tylenol prn. Add voltaren gel.  4. Mood: Provide emotional support             -antipsychotic agents: N/A 5. Neuropsych: This patient is capable of making decisions on his own behalf. 6. Skin/Wound Care: Routine skin checks 7. Fluids/Electrolytes/Nutrition: Routine in and outs with follow-up chemistries 8.  Dysphagia.  Dysphagia #1 nectar liquids.  Follow-up speech therapy- might need IVFs at night since on nectar thick liquids- check labs in AM  12/10 Cr 0.94 and BUN 17- doing OK- will recheck Mondays. 9.  Hypertension.  Cozaar 75 mg daily.  Monitor with increased mobility. Start tizanidine at night which will also help her sleep.  10.  Hyperlipidemia.  Crestor 11.  Hypothyroidism.  Synthroid 12.  Urinary retention.  d/c foley. Cannot use flomax due to sulfa allergy, continue urecholine- 5 mg TID and monitor 13.  AKI.  Baseline creatinine  1.0-1.4. Follow-up chemistries- esp in light of nectar thick liquids. Placed nursing order to encourage 6-8 glasses of thickened water per day. Repeat tomorrow. 12/10- doing well Cr 0.94 and BUN 17- will follow  weekly and prn  14.  Obesity.  BMI 26.52.  Provided dietary education 15. Disposition: HFU scheduled.   LOS: 4 days A FACE TO FACE EVALUATION WAS PERFORMED  Lateya Dauria P Britanny Marksberry 03/05/2021, 10:02 AM

## 2021-03-05 NOTE — Progress Notes (Signed)
Patient ID: Teresa [name], female   DOB: 12-04-1940, 80 y.o.   MRN: [id number] Spoke with daughter-jennifer to answer her questions regarding team conference on Wednesday. Discussed aide services and if more than what home health provides will get them a private duty list so they can follow up with

## 2021-03-05 NOTE — Progress Notes (Signed)
Occupational Therapy Session Note  Patient Details  Name: Teresa [name] MRN: [id number] Date of Birth: 1940/07/01  Today's Date: 03/05/2021 OT Individual Time: 7867-6720 OT Individual Time Calculation (min): 70 min    Short Term Goals: Week 1:  OT Short Term Goal 1 (Week 1): Pt will don shirt with minA while maintaining balance EOB with min A OT Short Term Goal 2 (Week 1): Pt will transfer to toilet/ BSC with mod A consistently OT Short Term Goal 3 (Week 1): Pt will locate items on left side in ADL tasks with mod cues OT Short Term Goal 4 (Week 1): Pt will perform sit to stands for toileting tasks with mod A  Skilled Therapeutic Interventions/Progress Updates:    Pt semi reclined, strongly requesting to shower, no c/o pain. Supine to sit with min assist.  Pt had one LOB to left needing mod assist to recover.  Squat pivot to bedside commode with mod assist due to pt reporting she wants to try before showering.  Clothing management with total assist.  Unable to have continent episode with increased time allowed.  Squat pivot to w/c with mod assist.  Transported to bathroom and completed squat pivot to shower bench with max assist due to tight space negotiation and large shower threshold to maneuver.  Pt doffed shirt with max assist then bathed UB with mod assist.  Educated pt on lateral leaning technique to wash buttocks and completed with min assist for dynamic sitting balance.  Pt also needing min assist to support LLE into figure 4 so pt could wash foot successfully.  Pt dried off, then completed squat pivot to w/c with max assist. Educated pt on hemi technique to donn shirt and required max assist to complete.  Also educated on hemi techique to donn brief and pants and needed total assist for time constraint.  Pt left with LUE supported in arm trough, call bell in reach, seat belt alarm on.    Therapy Documentation Precautions:  Precautions Precautions: Fall Precaution Comments: L  hemipareisis, L neglect/ inattention Restrictions Weight Bearing Restrictions: No Other Position/Activity Restrictions: shoulder sling for comfort with mobility -- only if it doesn't bother her R cervical spine    Therapy/Group: Individual Therapy  Ezekiel Slocumb 03/05/2021, 12:58 PM

## 2021-03-06 ENCOUNTER — Inpatient Hospital Stay (HOSPITAL_COMMUNITY): Payer: Medicare PPO

## 2021-03-06 MED ORDER — NIFEDIPINE 10 MG PO CAPS
10.0000 mg | ORAL_CAPSULE | Freq: Every day | ORAL | Status: DC
  Administered 2021-03-06 – 2021-03-08 (×3): 10 mg via ORAL
  Filled 2021-03-06 (×4): qty 1

## 2021-03-06 MED ORDER — CYCLOBENZAPRINE HCL 5 MG PO TABS
5.0000 mg | ORAL_TABLET | Freq: Three times a day (TID) | ORAL | Status: DC | PRN
  Administered 2021-03-08 – 2021-03-22 (×4): 5 mg via ORAL
  Filled 2021-03-06 (×4): qty 1

## 2021-03-06 MED ORDER — ENSURE ENLIVE PO LIQD
237.0000 mL | Freq: Two times a day (BID) | ORAL | Status: DC
  Administered 2021-03-06 – 2021-03-20 (×28): 237 mL via ORAL

## 2021-03-06 NOTE — Progress Notes (Signed)
Occupational Therapy Session Note  Patient Details  Name: Teresa [name] MRN: [id number] Date of Birth: [birth year]  Today's Date: 03/06/2021 OT Individual Time: 9233-0076 OT Individual Time Calculation (min): 55 min    Short Term Goals: Week 1:  OT Short Term Goal 1 (Week 1): Pt will don shirt with minA while maintaining balance EOB with min A OT Short Term Goal 2 (Week 1): Pt will transfer to toilet/ BSC with mod A consistently OT Short Term Goal 3 (Week 1): Pt will locate items on left side in ADL tasks with mod cues OT Short Term Goal 4 (Week 1): Pt will perform sit to stands for toileting tasks with mod A  Skilled Therapeutic Interventions/Progress Updates:    Pt resting in bed upon arrival. Pt reports that her neck is very painful and has been since her stroke. Pt unable to rotate head Lt or Rt. Kpad helps with pain. Pt able to follow objects with eyes during quick assessment. Pt did comment that reading is hard for her at present but denied diplopia. Supine>sit EOB with min A. Pt able to scoot to EOB. Max verbal cues for LUE positioning. Mod A for squat pivot transfer to Rt into recliner. Emphasis on LUE shoulder flexion with trace movement noted. Pt unable to avert gaze to LUE 2/2 inability to rotate head. Pt remained in relciner with all needs within reach and belt alarm activated.   Therapy Documentation Precautions:  Precautions Precautions: Fall Precaution Comments: L hemipareisis, L neglect/ inattention Restrictions Weight Bearing Restrictions: No Other Position/Activity Restrictions: shoulder sling for comfort with mobility -- only if it doesn't bother her R cervical spine   Pain: Pain Assessment Pain Scale: 0-10 Pain Score: 8  Pain Type: Acute pain Pain Location: Neck Pain Intervention(s): MD notified (Comment) (in room for assessment)   Therapy/Group: Individual Therapy  Leroy Libman 03/06/2021, 10:30 AM

## 2021-03-06 NOTE — Progress Notes (Signed)
Speech Language Pathology Daily Session Note  Patient Details  Name: Oona Trammel MRN: [id number] Date of Birth: 1940/09/29  Today's Date: 03/06/2021 SLP Individual Time: 2426-8341 SLP Individual Time Calculation (min): 45 min  Short Term Goals: Week 1: SLP Short Term Goal 1 (Week 1): Patient will tolerate current diet (Dys 2, thin liquids) without overt s/s aspiration or penetration with min-modA cues for awareness and use of strategies. SLP Short Term Goal 2 (Week 1): Patient will demonstrate adequate awareness to errors during completion of ADL and functional tasks, with min-modA verbal, visual cues. SLP Short Term Goal 3 (Week 1): Patient will selectively attend and exhibit adequate turn taking and topic maintenance during conversation with SLP with minA verbal cues to redirect. SLP Short Term Goal 4 (Week 1): Patient will participate in further assessment of complex level cognitive abilities. (money, medication management, etc). SLP Short Term Goal 5 (Week 1): Patient will recall and return demonstrate safety strategies and exercises learned in PT/OT/ST sessions, with minA verbal/visual cues.  Skilled Therapeutic Interventions: Pt seen for skilled ST with focus on cognitive goals, in bed with ongoing complaints of neck pain (MD in for assessment and is aware). Pt reports not change in cognitive or swallow function since CVA, insight and awareness of deficits impaired throughout session. SLP facilitating money counting task by providing mod-max A multimodal cues to attempt to increase accuracy. Pt would report difficulty seeing coins, coins were removed and pt remains with significant difficulty with basic money calculations. Pt also reports difficulty reading her clock, processing numbers appears to be a barrier at this time. Assessed basic writing skills, pt able to write functional information with 100% legibility, reports no change. SLP facilitating task of following written  instructions by providing mod A cues to reduce impulsivity with answers, 40% accuracy. When pt made aware of errors, she stated "my eyes aren't good" however she would read prompt and questions with 100% accuracy. Pt will benefit from ongoing training and education regarding awareness/insight into current cognitive deficits and compensatory strategies. Pt left in bed with all needs within reach and bed alarm set. Cont ST POC.   Pain Pain Assessment Pain Scale: 0-10 Pain Score: 8  Pain Type: Acute pain Pain Location: Neck Pain Intervention(s): MD notified (Comment) (in room for assessment)  Therapy/Group: Individual Therapy  Dewaine Conger 03/06/2021, 9:12 AM

## 2021-03-06 NOTE — Progress Notes (Signed)
Initial Nutrition Assessment  DOCUMENTATION CODES:   Not applicable  INTERVENTION:  Provide Ensure Enlive po BID, each supplement provides 350 kcal and 20 grams of protein  Encourage adequate PO intake.   NUTRITION DIAGNOSIS:   Increased nutrient needs related to  (therapy) as evidenced by estimated needs.  GOAL:   Patient will meet greater than or equal to 90% of their needs  MONITOR:   PO intake, Supplement acceptance, Labs, Weight trends, Diet advancement, Skin, I & O's  REASON FOR ASSESSMENT:    (Poor po)    ASSESSMENT:   80 year old female with history of hypothyroidism, Raynaud's disease presents with progressive onset of left-sided weakness and dysarthria. 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 Therapy evaluations completed due to patient's left-sided weakness and dysarthria was admitted for CIR.  Meal completion has been varied from 15-100%. Pt reports appetite is fine, however felt full during time of visit. Noted pt did complete 100% of meal prior to RD visit. Pt reports eating well prior to admission. RD to order nutritional supplements to aid in caloric and protein needs. Pt encouraged to eat her food at meals to and drink her supplements.   Unable to complete Nutrition-Focused physical exam at this time. Pt busy with nursing cares.   Labs and medications reviewed.   Diet Order:   Diet Order             DIET DYS 2 Room service appropriate? Yes; Fluid consistency: Thin  Diet effective now                   EDUCATION NEEDS:   Not appropriate for education at this time  Skin:  Skin Assessment: Reviewed RN Assessment  Last BM:  12/11  Height:   Ht Readings from Last 1 Encounters:  03/01/21 5\' 2"  (1.575 m)    Weight:   Wt Readings from Last 1 Encounters:  03/01/21 68.2 kg   BMI:  Body mass index is 27.5 kg/m.  Estimated Nutritional Needs:   Kcal:  1700-1850  Protein:  85-95  grams  Fluid:  >/= 1.7 L/day   Corrin Parker, MS, RD, LDN RD pager number/after hours weekend pager number on Amion.

## 2021-03-06 NOTE — Progress Notes (Signed)
PROGRESS NOTE   Subjective/Complaints: C/o Raynaud's syndrome. She takes Nifedipine at home. They have been applying warm packs here which helps C/o neck pain. Heating pad helps. Takes flexeril at home- will add here.   ROS:  Pt denies SOB, abd pain, CP, N/V/C/D, and vision changes  (+) neck pain, +urinary incontinence, +Raynaud's syndrome   Objective:   No results found. No results for input(s): WBC, HGB, HCT, PLT in the last 72 hours.  No results for input(s): NA, K, CL, CO2, GLUCOSE, BUN, CREATININE, CALCIUM in the last 72 hours.   Intake/Output Summary (Last 24 hours) at 03/06/2021 1232 Last data filed at 03/06/2021 0800 Gross per 24 hour  Intake 360 ml  Output 697 ml  Net -337 ml        Physical Exam: Vital Signs Blood pressure 140/73, pulse 65, temperature 97.7 F (36.5 C), temperature source Oral, resp. rate 16, height 5\' 2"  (1.575 m), weight 68.2 kg, SpO2 97 %.   General: awake, alert, appropriate, sitting up in bed; heat pack on neck; NT in room; NAD HENT: R gaze preference- can look Left with cues CV: regular rate; no JVD Pulmonary: CTA B/L; no W/R/R- good air movement GI: soft, NT, ND, (+)BS Psychiatric: appropriate; flat affect Neurological: alert  Musculoskeletal:     Cervical back: Normal range of motion. No rigidity.     Comments: RUE/RLE 5/5 LUE- 0/5, WE, grip and finger abd, 2/5 EF and EE LLE 2-/5 in HF.KE.DF and PF- no significant change today  Skin:    Comments: Buttocks no wounds 2 spots on back of thighs- scratch and birth mark Heels OK- no bogginess Squeezing abrasion of R upper arm from BP cuff  Cold hands and feet Neurological:     Comments: Patient is alert.  Moderate dysarthria.  Left noted facial droop.  She is able to name objects.  Follows simple commands Says light touch intact in all 4 extremities, but has L inattention Dysarthric-   Psychiatric:     Comments: Extremely  FLAT     Assessment/Plan: 1. Functional deficits which require 3+ hours per day of interdisciplinary therapy in a comprehensive inpatient rehab setting. Physiatrist is providing close team supervision and 24 hour management of active medical problems listed below. Physiatrist and rehab team continue to assess barriers to discharge/monitor patient progress toward functional and medical goals  Care Tool:  Bathing    Body parts bathed by patient: Face, Abdomen   Body parts bathed by helper: Right arm, Left arm, Chest, Front perineal area, Buttocks, Right upper leg, Left upper leg, Right lower leg, Left lower leg     Bathing assist Assist Level: Moderate Assistance - Patient 50 - 74%     Upper Body Dressing/Undressing Upper body dressing   What is the patient wearing?: Pull over shirt    Upper body assist Assist Level: Total Assistance - Patient < 25%    Lower Body Dressing/Undressing Lower body dressing      What is the patient wearing?: Pants, Incontinence brief     Lower body assist Assist for lower body dressing: Total Assistance - Patient < 25%     Toileting Toileting  Toileting assist Assist for toileting: Moderate Assistance - Patient 50 - 74%     Transfers Chair/bed transfer  Transfers assist     Chair/bed transfer assist level: 2 Helpers     Locomotion Ambulation   Ambulation assist   Ambulation activity did not occur: Safety/medical concerns          Walk 10 feet activity   Assist  Walk 10 feet activity did not occur: Safety/medical concerns        Walk 50 feet activity   Assist Walk 50 feet with 2 turns activity did not occur: Safety/medical concerns         Walk 150 feet activity   Assist Walk 150 feet activity did not occur: Safety/medical concerns         Walk 10 feet on uneven surface  activity   Assist Walk 10 feet on uneven surfaces activity did not occur: Safety/medical concerns          Wheelchair     Assist Is the patient using a wheelchair?: Yes Type of Wheelchair: Manual    Wheelchair assist level: Total Assistance - Patient < 25%, Dependent - Patient 0% Max wheelchair distance: 300 ft    Wheelchair 50 feet with 2 turns activity    Assist        Assist Level: Total Assistance - Patient < 25%   Wheelchair 150 feet activity     Assist      Assist Level: Total Assistance - Patient < 25%, Dependent - Patient 0%   Blood pressure 140/73, pulse 65, temperature 97.7 F (36.5 C), temperature source Oral, resp. rate 16, height 5\' 2"  (1.575 m), weight 68.2 kg, SpO2 97 %.  Medical Problem List and Plan: 1. 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 Impaired mobility: continue Lovenox             -antiplatelet therapy: Aspirin 81 mg daily and Plavix 75 mg day x3 weeks then Plavix alone 3. Neck pain: kpad added. Continue tylenol prn. Add voltaren gel. Add flexeril 5mg  TID PRN. XR ordered.  4. Mood: Provide emotional support             -antipsychotic agents: N/A 5. Neuropsych: This patient is capable of making decisions on his own behalf. 6. Skin/Wound Care: Routine skin checks 7. Fluids/Electrolytes/Nutrition: Routine in and outs with follow-up chemistries 8.  Dysphagia.  Dysphagia #1 nectar liquids.  Follow-up speech therapy- might need IVFs at night since on nectar thick liquids- check labs in AM  12/10 Cr 0.94 and BUN 17- doing OK- will recheck Mondays. 9.  Hypertension.  Cozaar 75 mg daily.  Monitor with increased mobility. Start tizanidine at night which will also help her sleep.  10.  Hyperlipidemia.  Crestor 11.  Hypothyroidism.  Synthroid 12.  Urinary retention.  d/c foley. Cannot use flomax due to sulfa allergy, continue urecholine- 5 mg TID and monitor 13.  AKI.  Baseline creatinine 1.0-1.4. Follow-up  chemistries- esp in light of nectar thick liquids. Placed nursing order to encourage 6-8 glasses of thickened water per day. Repeat tomorrow. 12/10- doing well Cr 0.94 and BUN 17- will follow weekly and prn  14.  Obesity.  BMI 26.52.  Provided dietary education 15. Raynaud's syndrome: nifedipine ordered  16. Disposition: HFU scheduled.   LOS: 5 days A FACE TO FACE EVALUATION WAS PERFORMED  Teresa [name] Teresa [name] 03/06/2021, 12:32 PM

## 2021-03-06 NOTE — Progress Notes (Signed)
Occupational Therapy Session Note  Patient Details  Name: Teresa [name] MRN: [id number] Date of Birth: 1941-02-03  Today's Date: 03/06/2021 OT Individual Time: 1300-1345 OT Individual Time Calculation (min): 45 min    Short Term Goals: Week 1:  OT Short Term Goal 1 (Week 1): Pt will don shirt with minA while maintaining balance EOB with min A OT Short Term Goal 2 (Week 1): Pt will transfer to toilet/ BSC with mod A consistently OT Short Term Goal 3 (Week 1): Pt will locate items on left side in ADL tasks with mod cues OT Short Term Goal 4 (Week 1): Pt will perform sit to stands for toileting tasks with mod A  Skilled Therapeutic Interventions/Progress Updates:    Pt sitting up in recliner, c/o significant pain and tightness in neck on left side. Provision of soft tissue massage with trigger point release technique to patients tolerance.  Pt reports some improvement felt afterwards.  Stand pivot to w/c using RW with step by step multimodal cues and mod assist including managing walker.  Transported to gym and completed neuro re-ed to left shoulder using mirror for visual feedback, tactile cues to provide external focus, and verbal cues to recruit desired movement including scapular elevation and depression.  Pt completed squat pivot with mod assist to EOM.  LUE weightbearing with max manual support at shoulder and elbow while pt instructed to reach and place cone to left of midline ( to facilitate visual scanning and left attention).  Squat pivot to w/c with mod assist.  Transported back to room and completed stand pivot with RW needing mod-max assist with max multimodal cues for motor planning/sequencing of steps, weight shifting, and RW mgt.  Call bell in reach, seat belt alarm on.  Therapy Documentation Precautions:  Precautions Precautions: Fall Precaution Comments: L hemipareisis, L neglect/ inattention Restrictions Weight Bearing Restrictions: No Other Position/Activity  Restrictions: shoulder sling for comfort with mobility -- only if it doesn't bother her R cervical spine    Therapy/Group: Individual Therapy  Ezekiel Slocumb 03/06/2021, 3:43 PM

## 2021-03-06 NOTE — Progress Notes (Signed)
Physical Therapy Session Note  Patient Details  Name: Teresa [name] MRN: [id number] Date of Birth: 18-Oct-1940  Today's Date: 03/06/2021 PT Individual Time: 1100-1200 PT Individual Time Calculation (min): 60 min   Short Term Goals: Week 1:  PT Short Term Goal 1 (Week 1): Pt will perform bed mobility with consistent MinA. PT Short Term Goal 2 (Week 1): Pt will perform sit<>stand transfers with consistent CGA. PT Short Term Goal 3 (Week 1): Pt will perform seat to seat transfers wiyh consistent MinA. PT Short Term Goal 4 (Week 1): Pt will initiate gait training. PT Short Term Goal 5 (Week 1): Pt will initiate w/c mobility training.  Skilled Therapeutic Interventions/Progress Updates:     Pt seen sitting in recliner to start session - agreeable to PT tx and reports unrated neck pain. Pt with k-pad on heating setting and provided gentle soft tissue release and upper trap stretching for pain management. Pt able to recall previous therapy sessions without cues - states that she struggled with money management in SLP session but contributed difficulties due to "fake coins." Pt with some lack of insight into deficits.   She was able to [facility] hospital socks with supervision assist and increased time needed for paretic L side. Donned socks with minA and required totalA for donning shoes. Pt completed stand<>pivot transfer with modA from recliner to w/c, towards weaker L side. Assist for weight shifting to offload LLE while side stepping with some soft L knee buckling noted. Transported downstairs to 42M rehab gym for time and energy conservation.   Assisted to mat table via lateral scoot transfer, requiring min/modA with poor forward and lateral weight shift during transfer. Required x5 scoots to achieve full transfer. At [facility], able to sit unsupported with SBA. Retrieved RW splint and RW to work on standing balance and to promote weight bearing and attention to LUE. Able to rise to stand with minA to  RW. Initially required minA for standing balance but faded to modA due to L lean. Spent a lot of time on static standing for postural awareness/control, LLE weight bearing. Able to integrate standing balance task which included midline and forward reaching with RUE with min/modA for balance. Pt with some significant L neglect and requires max verbal cueing for visual scanning to L. Scanning L also limited by neck pains.   Pt assisted back to her w/c via lateral scoot transfer with min/modA with similar difficulties as above. Returned to her room and assisted back to recliner via stand<>pivot transfer with modA. Remained seated in recliner with safety belt alarm on, all needs in reach, and pillow supporting paretic LUE.  Therapy Documentation Precautions:  Precautions Precautions: Fall Precaution Comments: L hemipareisis, L neglect/ inattention Restrictions Weight Bearing Restrictions: No Other Position/Activity Restrictions: shoulder sling for comfort with mobility -- only if it doesn't bother her R cervical spine General:    Therapy/Group: Individual Therapy  Alger Simons 03/06/2021, 7:27 AM

## 2021-03-07 NOTE — Progress Notes (Signed)
Patient ID: Teresa [name], female   DOB: [birth year], 80 y.o.   MRN: [id number]  Met with pt and spoke with daughter-Teresa [name] via telephone will meet with Teresa [name] when here. Currently he is installing grab bars in their bathroom according to pt. Discussed goals of CGA-min level and target discharge date of 12/30. Pt is feeling like she is making progress but is hindered y her neck pain but the k-pad helps. She hopes to make good progress while here. Her daughter will work with Dad on safe discharge plan and will leave private duty list in room for them to follow up with. Will continue to work on discharge plans.

## 2021-03-07 NOTE — Progress Notes (Signed)
Occupational Therapy Session Note  Patient Details  Name: Teresa [name] MRN: [id number] Date of Birth: 07-19-1940  Today's Date: 03/07/2021 OT Individual Time: 2419-9144 OT Individual Time Calculation (min): 42 min    Short Term Goals: Week 1:  OT Short Term Goal 1 (Week 1): Pt will don shirt with minA while maintaining balance EOB with min A OT Short Term Goal 2 (Week 1): Pt will transfer to toilet/ BSC with mod A consistently OT Short Term Goal 3 (Week 1): Pt will locate items on left side in ADL tasks with mod cues OT Short Term Goal 4 (Week 1): Pt will perform sit to stands for toileting tasks with mod A  Skilled Therapeutic Interventions/Progress Updates:  Patient met seated in recliner in agreement with OT treatment session. 10/10 pain reported at rest in L aspect of L neck/shoulder. Max A to doff overhead shirt. Gentle soft tissue massage initiated followed by gentle AROM and AAROM at cervical neck to decrease pain and facilitate soft tissue elongation. Patient reporting slight decrease in pain. Attention then shifted to education on self-ROM with patient completing each exercise with Min A and cues for pacing/form. Session concluded with patient seated in recliner with call bell within reach, belt alarm activated and all needs met.   Therapy Documentation Precautions:  Precautions Precautions: Fall Precaution Comments: L hemipareisis, L neglect/ inattention Restrictions Weight Bearing Restrictions: No Other Position/Activity Restrictions: shoulder sling for comfort with mobility -- only if it doesn't bother her R cervical spine General:    Therapy/Group: Individual Therapy  [name] 03/07/2021, 6:57 AM

## 2021-03-07 NOTE — Patient Care Conference (Signed)
Inpatient RehabilitationTeam Conference and Plan of Care Update Date: 03/07/2021   Time: 11:13 AM    Patient Name: Teresa [name] [facility]      Medical Record Number: [id number]  Date of Birth: [birth year] Sex: Female         Room/Bed: 69C02C/5C02C-01 Payor Info: Payor: [employer] MEDICARE / Plan: [place] PPO / Product Type: *No Product type* /    Admit Date/Time:  03/01/2021  1:42 PM  Primary Diagnosis:  Right middle cerebral artery stroke [facility])  Hospital Problems: Principal Problem:   Right middle cerebral artery stroke [facility])    Expected Discharge Date: Expected Discharge Date: 03/23/21  Team Members Present: Physician leading conference: Dr. Leeroy Cha Social Worker Present: Ovidio Kin, LCSW Nurse Present: Dorien Chihuahua, RN PT Present: Ginnie Smart, PT OT Present: Leretha Pol, OT SLP Present: Lillie Columbia, SLP PPS Coordinator present : Gunnar Fusi, SLP     Current Status/Progress Goal Weekly Team Focus  Bowel/Bladder   Continent of bowel. Some incontinence episodes with bladder. LBM 12/11  Full continence  Toilet Q2-3 hr.   Swallow/Nutrition/ Hydration   Dys 2, thin min A for swallow strategies  regular/thin with Mod I  tolerating Dys 2, trials of Dys 3   ADL's   mod assist functional transfers, Trace left shoulder elevation/depression, impaired left attention, limtied cervical ROM, max assist UB/LB dressing, mod bathing, mod/max toileting  min-supervision  soft tissue massage neck, self care training, functional transfers, neuro re-ed LUE, hemi strategies   Mobility   min/modA transfers, min/modA static standing balance, min/modA for pre-gait tasks. Non-ambulatory this date. L neglect  CGA transfers, minA gait  NMR for LLE, pre-gait and gait training, functional transfers (squat pivot), safety awareness education, standing tolerance/balance   Communication             Safety/Cognition/ Behavioral Observations  min-mod A for cognition  Supervision  A  increasing error awareness, functional problem solving, assessing med/money management   Pain   C/o pain to neck. Kpad in place  Pain <3/10  Assess Qshift and prn   Skin   Skin intact  Maintain skin integrity.  Assess Qshift and prn     Discharge Planning:  Family to come up with a plan for 24/7, husband can assist some but not much physcial assist. May need to hire assist for home-daughter involved   Team Discussion: Patient with neck pain which impairs sleep. Pain addressed per MD. Also note left neglect and left inattention, memory issues post CVA. Patient appears to mask deficits; is impulsive with memory deficits and poor error awareness. Patient is incontinent; requiring I+O catheterization.    Patient on target to meet rehab goals: yes, currently needs mod assist for transfers with loss of balance to the left. Needs mod - max cues to correct. Goals for discharge set for CGA  overall and  min assist for gait.  *See Care Plan and progress notes for long and short-term goals.   Revisions to Treatment Plan:  Pain medications adjusted Soft tissue massage and stretch exercises added Xray of neck; noted paraspinal issue/arthropathy Education on hemi techniques/multi-modal cues   Teaching Needs: Safety, transfers, toileting, medication management, secondary risk management, etc.   Current Barriers to Discharge: Decreased caregiver support and Neurogenic bowel and bladder  Possible Resolutions to Barriers: Family education with spouse     Medical Summary Current Status: neck pain with limited range of motion and tight paraspinals, sedation with muscle relaxers, overweight BMI 27.50, urinary retention and incontinence  Barriers to Discharge: Neurogenic Bowel & Bladder;Medical stability  Barriers to Discharge Comments: neck pain with limited range of motion and tight paraspinals, sedation with muscle relaxers, overweight BMI 27.50, urinary retention and incontinence Possible  Resolutions to [employer] Focus: continue soft tissue massage, flexeril PRN during the day, tizanidine at night, neck XR obtained and results discussed, bowel and bladder program   Continued Need for Acute Rehabilitation Level of Care: The patient requires daily medical management by a physician with specialized training in physical medicine and rehabilitation for the following reasons: Direction of a multidisciplinary physical rehabilitation program to maximize functional independence : Yes Medical management of patient stability for increased activity during participation in an intensive rehabilitation regime.: Yes Analysis of laboratory values and/or radiology reports with any subsequent need for medication adjustment and/or medical intervention. : Yes   I attest that I was present, lead the team conference, and concur with the assessment and plan of the team.   Dorien Chihuahua B 03/07/2021, 2:57 PM

## 2021-03-07 NOTE — Progress Notes (Signed)
PROGRESS NOTE   Subjective/Complaints: Continues to have 10/10 neck pain- discussed that XR results show stable hardware with spondylosis and facet arthropathy at the nonsurgical levels. Continue heating pad which is helpful, tizanidine HS and flexeril PRN during the day  ROS:  Pt denies SOB, abd pain, CP, N/V/C/D, and vision changes  (+) neck pain, +urinary incontinence, +Raynaud's syndrome, +increased sleepiness from muscle relaxers   Objective:   DG Cervical Spine 2 or 3 views  Result Date: 03/06/2021 CLINICAL DATA:  Lower neck pain EXAM: CERVICAL SPINE - 2-3 VIEW COMPARISON:  02/26/2021 FINDINGS: Frontal and lateral views of the cervical spine are obtained. Previous ACDF spanning C4 through C7 identified, with bony fusion across the disc spaces. There is prominent spondylosis at C2-3 and to a greater extent C3-4. Alignment remains anatomic. There is diffuse multilevel facet hypertrophy greatest from C2 through C4. Prevertebral soft tissues are unremarkable. Lung apices are clear. IMPRESSION: 1. Stable C4-C7 ACDF. 2. Stable spondylosis and facet hypertrophy at the nonsurgical levels. 3. No acute bony abnormality. Electronically Signed   By: Randa Ngo M.D.   On: 03/06/2021 17:21   No results for input(s): WBC, HGB, HCT, PLT in the last 72 hours.  No results for input(s): NA, K, CL, CO2, GLUCOSE, BUN, CREATININE, CALCIUM in the last 72 hours.   Intake/Output Summary (Last 24 hours) at 03/07/2021 1035 Last data filed at 03/07/2021 0830 Gross per 24 hour  Intake 680 ml  Output --  Net 680 ml        Physical Exam: Vital Signs Blood pressure 120/63, pulse 64, temperature 97.6 F (36.4 C), temperature source Oral, resp. rate 18, height 5\' 2"  (1.575 m), weight 68.2 kg, SpO2 98 %.   General: awake, alert, appropriate, sitting up in bed; heat pack on neck; NT in room; NAD HENT: R gaze preference- can look Left with  cues CV: regular rate; no JVD Pulmonary: CTA B/L; no W/R/R- good air movement GI: soft, NT, ND, (+)BS Psychiatric: appropriate; flat affect Neurological: alert  Musculoskeletal:     Cervical back: Normal range of motion. No rigidity.     Comments: RUE/RLE 5/5 LUE- 0/5, WE, grip and finger abd, 2/5 EF and EE LLE 2-/5 in HF.KE.DF and PF- no significant change today Tight cervical paraspinals  Skin:    Comments: Buttocks no wounds 2 spots on back of thighs- scratch and birth mark Heels OK- no bogginess Squeezing abrasion of R upper arm from BP cuff  Cold hands and feet Neurological:     Comments: Patient is alert.  Moderate dysarthria.  Left noted facial droop.  She is able to name objects.  Follows simple commands Says light touch intact in all 4 extremities, but has L inattention Dysarthric-   Psychiatric:     Comments: Extremely FLAT     Assessment/Plan: 1. Functional deficits which require 3+ hours per day of interdisciplinary therapy in a comprehensive inpatient rehab setting. Physiatrist is providing close team supervision and 24 hour management of active medical problems listed below. Physiatrist and rehab team continue to assess barriers to discharge/monitor patient progress toward functional and medical goals  Care Tool:  Bathing    Body parts  bathed by patient: Face, Abdomen   Body parts bathed by helper: Right arm, Left arm, Chest, Front perineal area, Buttocks, Right upper leg, Left upper leg, Right lower leg, Left lower leg     Bathing assist Assist Level: Moderate Assistance - Patient 50 - 74%     Upper Body Dressing/Undressing Upper body dressing   What is the patient wearing?: Pull over shirt    Upper body assist Assist Level: Total Assistance - Patient < 25%    Lower Body Dressing/Undressing Lower body dressing      What is the patient wearing?: Pants, Incontinence brief     Lower body assist Assist for lower body dressing: Total Assistance -  Patient < 25%     Toileting Toileting    Toileting assist Assist for toileting: Moderate Assistance - Patient 50 - 74%     Transfers Chair/bed transfer  Transfers assist     Chair/bed transfer assist level: 2 Helpers     Locomotion Ambulation   Ambulation assist   Ambulation activity did not occur: Safety/medical concerns          Walk 10 feet activity   Assist  Walk 10 feet activity did not occur: Safety/medical concerns        Walk 50 feet activity   Assist Walk 50 feet with 2 turns activity did not occur: Safety/medical concerns         Walk 150 feet activity   Assist Walk 150 feet activity did not occur: Safety/medical concerns         Walk 10 feet on uneven surface  activity   Assist Walk 10 feet on uneven surfaces activity did not occur: Safety/medical concerns         Wheelchair     Assist Is the patient using a wheelchair?: Yes Type of Wheelchair: Manual    Wheelchair assist level: Total Assistance - Patient < 25%, Dependent - Patient 0% Max wheelchair distance: 300 ft    Wheelchair 50 feet with 2 turns activity    Assist        Assist Level: Total Assistance - Patient < 25%   Wheelchair 150 feet activity     Assist      Assist Level: Total Assistance - Patient < 25%, Dependent - Patient 0%   Blood pressure 120/63, pulse 64, temperature 97.6 F (36.4 C), temperature source Oral, resp. rate 18, height 5\' 2"  (1.575 m), weight 68.2 kg, SpO2 98 %.  Medical Problem List and Plan: 1. 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 Impaired mobility: continue Lovenox             -antiplatelet therapy: Aspirin 81 mg daily and Plavix 75 mg day x3 weeks then Plavix alone 3. Neck pain: kpad added. continue tylenol prn. Add voltaren gel.  Add flexeril 5mg  TID PRN. XR ordered.  4. Mood: Provide emotional support             -antipsychotic agents: N/A 5. Neuropsych: This patient is capable of making decisions on his own behalf. 6. Skin/Wound Care: Routine skin checks 7. Fluids/Electrolytes/Nutrition: Routine in and outs with follow-up chemistries 8.  Dysphagia.  Dysphagia #1 nectar liquids.  Follow-up speech therapy- might need  IVFs at night since on nectar thick liquids- check labs in AM  12/10 Cr 0.94 and BUN 17- doing OK- will recheck Mondays. 9.  Hypertension.  Cozaar 75 mg daily.  Monitor with increased mobility. Continue tizanidine at night which will also help her sleep.  10.  Hyperlipidemia.  Crestor 11.  Hypothyroidism.  Synthroid 12.  Urinary retention.  d/c foley. Cannot use flomax due to sulfa allergy, continue urecholine- 5 mg TID and monitor 13.  AKI.  Baseline creatinine 1.0-1.4. Follow-up chemistries- esp in light of nectar thick liquids. Placed nursing order to encourage 6-8 glasses of thickened water per day. Repeat tomorrow. 12/10- doing well Cr 0.94 and BUN 17- will follow weekly and prn  14.  Obesity.  BMI 26.52.  Provided dietary education 15. Raynaud's syndrome: nifedipine ordered 16. Disposition: HFU scheduled.   LOS: 6 days A FACE TO FACE EVALUATION WAS PERFORMED  Martha Clan P Johnnette Laux 03/07/2021, 10:35 AM

## 2021-03-07 NOTE — Progress Notes (Signed)
Occupational Therapy Session Note  Patient Details  Name: Teresa [name] MRN: [id number] Date of Birth: [birth year]  Today's Date: 03/07/2021 OT Individual Time: 0900-1000 OT Individual Time Calculation (min): 60 min    Short Term Goals: Week 1:  OT Short Term Goal 1 (Week 1): Pt will don shirt with minA while maintaining balance EOB with min A OT Short Term Goal 2 (Week 1): Pt will transfer to toilet/ BSC with mod A consistently OT Short Term Goal 3 (Week 1): Pt will locate items on left side in ADL tasks with mod cues OT Short Term Goal 4 (Week 1): Pt will perform sit to stands for toileting tasks with mod A   Skilled Therapeutic Interventions/Progress Updates:    Pt semi reclined, nursing present to administer medications. Pt requesting to change clothes and get out of bed. Supine to sit with min assist and min multimodal cues for body mechanics.  Pt washed face and brushed teeth with setup and CGA sitting EOB.  Pt required education on hemitechnique to doff shirt successfully and needed max assist to complete.  Pt bathed UB with mod assist for thoroughness under right arm due to LUE flaccidity.  Pt doffed brief with right lateral lean and max assist.  Pt bathed LB with mod assist using left lateral lean to wash buttocks and therapist providing support to prevent LOB.  Pt donned shirt with education on hemitechniques however pt having difficulty following commands for follow through and required max assist to donn.  Pt donned brief and pants at sit<>stand level using RW for balance with max assist for clothing management and min assist to maintain balance.  Donned grip socks with min assist to support BLE in figure 4 and pt able to carryout hemitechnique to donn.  Stand pivot to w/c needing max multimodal cues and mod assist for balance, weightshifting, sequencing and placement, and RW mgt.  Call bell in reach, seat alarm on.   Therapy Documentation Precautions:  Precautions Precautions:  Fall Precaution Comments: L hemipareisis, L neglect/ inattention Restrictions Weight Bearing Restrictions: No Other Position/Activity Restrictions: shoulder sling for comfort with mobility -- only if it doesn't bother her R cervical spine    Therapy/Group: Individual Therapy  Ezekiel Slocumb 03/07/2021, 1:14 PM

## 2021-03-07 NOTE — Progress Notes (Signed)
Physical Therapy Session Note  Patient Details  Name: Teresa [name] MRN: [id number] Date of Birth: 06-27-1940  Today's Date: 03/07/2021 PT Individual Time: 1132-1200 + 1345-1440 PT Individual Time Calculation (min): 28 min  + 55 min  Short Term Goals: Week 1:  PT Short Term Goal 1 (Week 1): Pt will perform bed mobility with consistent MinA. PT Short Term Goal 2 (Week 1): Pt will perform sit<>stand transfers with consistent CGA. PT Short Term Goal 3 (Week 1): Pt will perform seat to seat transfers wiyh consistent MinA. PT Short Term Goal 4 (Week 1): Pt will initiate gait training. PT Short Term Goal 5 (Week 1): Pt will initiate w/c mobility training.  Skilled Therapeutic Interventions/Progress Updates:      1st session: Pt sitting in recliner to start session - agreeable to PT tx. Reports 9/10 L upper neck pain - has kpad on and treatment completed to tolerance with rest breaks, distractions, and mobility provided for pain management. Completed stand<>pivot transfer with modA from recliner to w/c, towards her weaker L side - assist needed for lateral weight shifting while pivoting. Transported in w/c to [facility] rehab gym for time and energy conservation. Therapy dogs (Dixie) in the hallway and pt requesting to pet for therapeutic relaxation and calming. Assisted onto Nustep via stand<>pivot transfer with similar manner as above and cues needed for safety approach and fully turning prior to sitting. Required assist for setup on Nustep and she completed 6 minutes using BLE and RUE at workload set to 4. VC needed for monitoring L Knee alignment due to genu valgus and poor hip abductor activation. Progressed to using BLE only at workload of 2 to isolate LLE hip extensors. Her focused attention, L neglect impacting this as well. Assisted back to w/c via stand<>pivot and returned to her room in w/c and then assisted back to recliner with modA stand pivot transfer. LUE supported with pillows and kpad  applied to neck for pain management. Safety belt alarm on and all needs within reach.   2nd session: Pt seated in recliner to start session with husband at the bedside. Pt agreeable to PT tx and reports 9/10 upper L neck pain (similar as earlier session). Treatment to tolerance with distraction, mobility provided for pain management. Stand<>Pivot transfer completed with modA to w/c and then transported downstairs to [facility] rehab gym for time.   Instructed in gait training in hallways while using R hand rail with therapist on stool to assist with gait facilitation on L side. Ambulated 25ft + 15ft with modA and R hand rail - assist primarily needed for LLE management, lateral weight shifting during swing phases, trunk support due to L lean, and L knee blocking to prevent buckling. Pt able to initiate LLE ~25% of swing phase. Pt deferring further gait trials 2/2 fatigue and neck pain. We discussed DC planning - requested home measurements (doorways) to assess w/c accessibility - she voiced she will speak with husband but may benefit from handout at later date.   Pt assisted onto Kinetron at w/c level with resistance set to 50cm/sec where she completed x6 minutes - emphasis on keeping L knee in neutral alignment due to valgus as well as hip extension to promote carryover into functional gait.  Pt reporting fatigue and requesting to return to room - she completed seated cognitive task with colored peg board where she was able to complete moderate difficulty with min cues. She demonstrated adequate error awareness and recognition as well.   Pt transported  back upstairs to her room where she requested to return to recliner. Stand<>pivot with modA to recliner and LUE supported with pillow - k-pad reapplied to upper back/neck and safety belt engaged at completion of session. All needs met. Husband updated on pt's mobility during session.   Therapy Documentation Precautions:  Precautions Precautions:  Fall Precaution Comments: L hemipareisis, L neglect/ inattention Restrictions Weight Bearing Restrictions: No Other Position/Activity Restrictions: shoulder sling for comfort with mobility -- only if it doesn't bother her R cervical spine General:    Therapy/Group: Individual Therapy  Alger Simons 03/07/2021, 7:41 AM

## 2021-03-08 LAB — CREATININE, SERUM
Creatinine, Ser: 1.42 mg/dL — ABNORMAL HIGH (ref 0.44–1.00)
GFR, Estimated: 38 mL/min — ABNORMAL LOW (ref >60)

## 2021-03-08 MED ORDER — NIFEDIPINE 10 MG PO CAPS
20.0000 mg | ORAL_CAPSULE | Freq: Every day | ORAL | Status: DC
  Administered 2021-03-09: 20 mg via ORAL
  Filled 2021-03-08: qty 2

## 2021-03-08 MED ORDER — LOSARTAN POTASSIUM 50 MG PO TABS
50.0000 mg | ORAL_TABLET | Freq: Every day | ORAL | Status: DC
  Administered 2021-03-09: 50 mg via ORAL
  Filled 2021-03-08: qty 1

## 2021-03-08 MED ORDER — BETHANECHOL CHLORIDE 10 MG PO TABS
10.0000 mg | ORAL_TABLET | Freq: Three times a day (TID) | ORAL | Status: DC
  Administered 2021-03-08 – 2021-03-11 (×9): 10 mg via ORAL
  Filled 2021-03-08 (×9): qty 1

## 2021-03-08 MED ORDER — ENOXAPARIN SODIUM 30 MG/0.3ML IJ SOSY
30.0000 mg | PREFILLED_SYRINGE | INTRAMUSCULAR | Status: DC
  Administered 2021-03-08 – 2021-03-11 (×4): 30 mg via SUBCUTANEOUS
  Filled 2021-03-08 (×4): qty 0.3

## 2021-03-08 NOTE — Progress Notes (Signed)
PROGRESS NOTE   Subjective/Complaints: Her neck pain is currently better after receiving Flexeril, benefits from massage as well.  She is still having trouble urinating, required catheterization this morning Cr 1.42 this morning, up from 1.42  ROS:  Pt denies SOB, abd pain, CP, N/V/C/D, and vision changes  (+) neck pain, +urinary incontinence, +Raynaud's syndrome, +increased sleepiness from muscle relaxers   Objective:   DG Cervical Spine 2 or 3 views  Result Date: 03/06/2021 CLINICAL DATA:  Lower neck pain EXAM: CERVICAL SPINE - 2-3 VIEW COMPARISON:  02/26/2021 FINDINGS: Frontal and lateral views of the cervical spine are obtained. Previous ACDF spanning C4 through C7 identified, with bony fusion across the disc spaces. There is prominent spondylosis at C2-3 and to a greater extent C3-4. Alignment remains anatomic. There is diffuse multilevel facet hypertrophy greatest from C2 through C4. Prevertebral soft tissues are unremarkable. Lung apices are clear. IMPRESSION: 1. Stable C4-C7 ACDF. 2. Stable spondylosis and facet hypertrophy at the nonsurgical levels. 3. No acute bony abnormality. Electronically Signed   By: Randa Ngo M.D.   On: 03/06/2021 17:21   No results for input(s): WBC, HGB, HCT, PLT in the last 72 hours.  Recent Labs    03/08/21 0459  CREATININE 1.42*     Intake/Output Summary (Last 24 hours) at 03/08/2021 1040 Last data filed at 03/08/2021 0900 Gross per 24 hour  Intake 594 ml  Output 700 ml  Net -106 ml        Physical Exam: Vital Signs Blood pressure 105/88, pulse 66, temperature 97.8 F (36.6 C), resp. rate 16, height 5\' 2"  (1.575 m), weight 68.2 kg, SpO2 99 %.   General: awake, alert, appropriate, sitting up in bed; heat pack on neck; NT in room; NAD HENT: R gaze preference- can look Left with cues CV: regular rate; no JVD Pulmonary: CTA B/L; no W/R/R- good air movement GI: soft, NT, ND,  (+)BS Psychiatric: appropriate; flat affect Neurological: alert  Musculoskeletal:     Cervical back: Normal range of motion. No rigidity.     Comments: RUE/RLE 5/5 LUE- 0/5, WE, grip and finger abd, 2/5 EF and EE LLE 2-/5 in HF.KE.DF and PF- no significant change today Tight cervical paraspinals  Skin:    Comments: Buttocks no wounds 2 spots on back of thighs- scratch and birth mark Heels OK- no bogginess Squeezing abrasion of R upper arm from BP cuff  Cold hands and feet Neurological:     Comments: Patient is alert.  Moderate dysarthria.  Left noted facial droop.  She is able to name objects.  Follows simple commands Says light touch intact in all 4 extremities, but has L inattention Dysarthric-   Cognitive deficits Psychiatric:     Comments: Extremely FLAT     Assessment/Plan: 1. Functional deficits which require 3+ hours per day of interdisciplinary therapy in a comprehensive inpatient rehab setting. Physiatrist is providing close team supervision and 24 hour management of active medical problems listed below. Physiatrist and rehab team continue to assess barriers to discharge/monitor patient progress toward functional and medical goals  Care Tool:  Bathing    Body parts bathed by patient: Face, Abdomen   Body parts  bathed by helper: Right arm, Left arm, Chest, Front perineal area, Buttocks, Right upper leg, Left upper leg, Right lower leg, Left lower leg     Bathing assist Assist Level: Moderate Assistance - Patient 50 - 74%     Upper Body Dressing/Undressing Upper body dressing   What is the patient wearing?: Pull over shirt    Upper body assist Assist Level: Total Assistance - Patient < 25%    Lower Body Dressing/Undressing Lower body dressing      What is the patient wearing?: Pants, Incontinence brief     Lower body assist Assist for lower body dressing: Total Assistance - Patient < 25%     Toileting Toileting    Toileting assist Assist for  toileting: Moderate Assistance - Patient 50 - 74%     Transfers Chair/bed transfer  Transfers assist     Chair/bed transfer assist level: 2 Helpers     Locomotion Ambulation   Ambulation assist   Ambulation activity did not occur: Safety/medical concerns          Walk 10 feet activity   Assist  Walk 10 feet activity did not occur: Safety/medical concerns        Walk 50 feet activity   Assist Walk 50 feet with 2 turns activity did not occur: Safety/medical concerns         Walk 150 feet activity   Assist Walk 150 feet activity did not occur: Safety/medical concerns         Walk 10 feet on uneven surface  activity   Assist Walk 10 feet on uneven surfaces activity did not occur: Safety/medical concerns         Wheelchair     Assist Is the patient using a wheelchair?: Yes Type of Wheelchair: Manual    Wheelchair assist level: Total Assistance - Patient < 25%, Dependent - Patient 0% Max wheelchair distance: 300 ft    Wheelchair 50 feet with 2 turns activity    Assist        Assist Level: Total Assistance - Patient < 25%   Wheelchair 150 feet activity     Assist      Assist Level: Total Assistance - Patient < 25%, Dependent - Patient 0%   Blood pressure 105/88, pulse 66, temperature 97.8 F (36.6 C), resp. rate 16, height 5\' 2"  (1.575 m), weight 68.2 kg, SpO2 99 %.  Medical Problem List and Plan: 1. 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 Impaired mobility: continue Lovenox             -antiplatelet therapy: Aspirin 81 mg daily and Plavix 75 mg day x3 weeks then Plavix alone 3. Neck pain: kpad added. continue tylenol prn. Add voltaren gel. Add flexeril 5mg  TID PRN. XR ordered.  4. Mood: Provide emotional support             -antipsychotic agents: N/A 5. Neuropsych: This patient  is capable of making decisions on his own behalf. 6. Skin/Wound Care: Routine skin checks 7. Fluids/Electrolytes/Nutrition: Routine in and outs with follow-up chemistries 8.  Dysphagia.  Dysphagia #1 nectar liquids.  Follow-up speech therapy- might need IVFs at night since on nectar thick liquids- check labs in AM  12/10 Cr 0.94 and BUN 17-  doing OK- will recheck Mondays. 9.  Hypertension.  Decrease Cozaar to 50mg  daily while increasing Nifedipine as in #15.  Monitor with increased mobility. Continue tizanidine at night which will also help her sleep.  10.  Hyperlipidemia.  Crestor 11.  Hypothyroidism.  Synthroid 12.  Urinary retention.  d/c foley. Cannot use flomax due to sulfa allergy, continue urecholine- 5 mg TID and monitor 13.  AKI.  Baseline creatinine 1.0-1.4. Follow-up chemistries- esp in light of nectar thick liquids. Placed nursing order to encourage 6-8 glasses of thickened water per day.  14.  Obesity.  BMI 26.52.  Provided dietary education 15. Raynaud's syndrome:increase nifedipine to 20mg  daily.  16. Cognitive deficits: continue intensive SLP.  16. Disposition: HFU scheduled.   LOS: 7 days A FACE TO FACE EVALUATION WAS PERFORMED  Zaviyar Rahal P Evelene Roussin 03/08/2021, 10:40 AM

## 2021-03-08 NOTE — Progress Notes (Signed)
Occupational Therapy Session Note  Patient Details  Name: Teresa [name] MRN: [id number] Date of Birth: September 07, 1940  Today's Date: 03/08/2021 OT Individual Time: 1400-1430 OT Individual Time Calculation (min): 30 min    Short Term Goals: Week 1:  OT Short Term Goal 1 (Week 1): Pt will don shirt with minA while maintaining balance EOB with min A OT Short Term Goal 2 (Week 1): Pt will transfer to toilet/ BSC with mod A consistently OT Short Term Goal 3 (Week 1): Pt will locate items on left side in ADL tasks with mod cues OT Short Term Goal 4 (Week 1): Pt will perform sit to stands for toileting tasks with mod A  Skilled Therapeutic Interventions/Progress Updates:    Pt sitting up in recliner, requesting to shower. Complaining that her toes have been very cold due to Raynaud's Syndrome. Sit to stand with min assist and ambulated using RW with mod assist and max multimodal cues.  Shower bench transfer with mod assist as well with max cues.  Pt doffed shirt needing mod assist and cues to initiate hemi technique due to pt did not recall from previous session.  Pt doffed pants and brief at sit<>stand level using grab bar for balance and max assist for clothing mgt.  Pt bathed UB and LB using lateral leans for buttocks with min assist overall. Pt dried off and completed tub bench transfer with mod assist.  Ambulated back to recliner needing max cues for weight shifting and BLE placement at RW.  Pt needing review of hemitechnique to donn long sleeved shirt due to attempting to donn over head initially and unable to complete.  Max assist for follow through of technique.  Pt donned brief and pants with max assist with difficulty reaching to thread over left foot.  Kpad applied to bilateral feet with BLE elevated in recliner.  Call bell and seat alarm on at end of session.  Therapy Documentation Precautions:  Precautions Precautions: Fall Precaution Comments: L hemipareisis, L neglect/  inattention Restrictions Weight Bearing Restrictions: No Other Position/Activity Restrictions: shoulder sling for comfort with mobility -- only if it doesn't bother her R cervical spine    Therapy/Group: Individual Therapy  Ezekiel Slocumb 03/08/2021, 6:22 PM

## 2021-03-08 NOTE — Progress Notes (Signed)
Physical Therapy Session Note  Patient Details  Name: Teresa [name] MRN: [id number] Date of Birth: 10-20-1940  Today's Date: 03/08/2021 PT Individual Time: 0800-0900 PT Individual Time Calculation (min): 60 min   Short Term Goals: Week 1:  PT Short Term Goal 1 (Week 1): Pt will perform bed mobility with consistent MinA. PT Short Term Goal 2 (Week 1): Pt will perform sit<>stand transfers with consistent CGA. PT Short Term Goal 3 (Week 1): Pt will perform seat to seat transfers wiyh consistent MinA. PT Short Term Goal 4 (Week 1): Pt will initiate gait training. PT Short Term Goal 5 (Week 1): Pt will initiate w/c mobility training.  Skilled Therapeutic Interventions/Progress Updates:     Patient in bed upon PT arrival. Patient alert and agreeable to PT session. Patient reported 9/10 cervical pain at beginning of session, RN made aware and provided a muscle relaxer during session with significant improvement at end of session with pain at 3/10. PT provided repositioning, AAROM for cervical rotation, soft tissue mobilization, rest breaks, and distraction as pain interventions throughout session.   Patient noted to have poor circulation to L 5th toe resulting in purple color. 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 Provided massage, warming, and patient performed mobility to improved circulation with some improvement. Performed again with patient in sitting with notable improvement with 5th toe white, then pink in color after.  Therapeutic Activity: Bed Mobility: Patient performed supine to sit with min A-CGA in a flat bed without use of bed rails. Provided verbal cues for rolling through L side-lying to come to sitting. Transfers: Patient performed sit to/from stand x2 using the RW with CGA-min A and x2 using R rail with CGA-close supervision. She performed stand pivot bed>w/c with min A using RW with L hand splint. Provided verbal  cues for hand placement and forward weight shift.  Gait Training:  Patient ambulated 30 feet forwards x2 and 30 feet backwards x1 using a L rail and 15 feet using RW with L hand splint with min A for trunk control and AD management, and mod progressing to supervision for L limb advancement. Ambulated with increased L adductor activation leading to crossing over, decreased L hamstring and hip flexor activation at pre-swing producing reduced R foot clearance, and decreased R weight shift with increased hip/knee flexion in R stance. Provided verbal cues for erect posture, and facilitation progressing to verbal cues to correct gait deviations previously stated.  Patient was transported in the w/c with total A throughout session for energy conservation and time management.  Patient in w/c in the room at end of session with breaks locked, seat belt alarm set, and all needs within reach.   Therapy Documentation Precautions:  Precautions Precautions: Fall Precaution Comments: L hemipareisis, L neglect/ inattention Restrictions Weight Bearing Restrictions: No Other Position/Activity Restrictions: shoulder sling for comfort with mobility -- only if it doesn't bother her R cervical spine   Therapy/Group: Individual Therapy  Mliss Wedin L Doshia Dalia PT, DPT  03/08/2021, 12:41 PM

## 2021-03-08 NOTE — Progress Notes (Signed)
Speech Language Pathology Daily Session Note  Patient Details  Name: Shenelle Klas MRN: [id number] Date of Birth: [birth year]  Today's Date: 03/08/2021 SLP Individual Time: 1300-1345 SLP Individual Time Calculation (min): 45 min  Short Term Goals: Week 1: SLP Short Term Goal 1 (Week 1): Patient will tolerate current diet (Dys 2, thin liquids) without overt s/s aspiration or penetration with min-modA cues for awareness and use of strategies. SLP Short Term Goal 2 (Week 1): Patient will demonstrate adequate awareness to errors during completion of ADL and functional tasks, with min-modA verbal, visual cues. SLP Short Term Goal 3 (Week 1): Patient will selectively attend and exhibit adequate turn taking and topic maintenance during conversation with SLP with minA verbal cues to redirect. SLP Short Term Goal 4 (Week 1): Patient will participate in further assessment of complex level cognitive abilities. (money, medication management, etc). SLP Short Term Goal 5 (Week 1): Patient will recall and return demonstrate safety strategies and exercises learned in PT/OT/ST sessions, with minA verbal/visual cues.  Skilled Therapeutic Interventions:   Patient seen for skilled ST session focused on cognitive function goals. Patient was sitting up in recliner with feet elevated on chair when SLP arrived. She attempted to recall SLP's name, saying "Tom?" (Incorrect but is name of therapist she has worked with). Patient participated in portions of CADL and CLQT. Patient demonstrated ability to scan to left side visual field for reading simulated menu and completed patient information form without errors. When completing symbol cancellation section of CLQT, she missed items on left side of page without awareness. She drew a clock with numbers slightly uneven with spacing but no significant difference between left and right side of clock and with patient demonstrating awareness to her errors. Her responses continue  to be very brief, impulsive and monotone. She continues to benefit from skilled SLP intervention to maximize cognitive and swallow function goals prior to discharge.  Pain Pain Assessment Pain Scale: 0-10 Pain Score: 0-No pain  Therapy/Group: Individual Therapy  Sonia Baller, MA, CCC-SLP Speech Therapy

## 2021-03-08 NOTE — Progress Notes (Signed)
Occupational Therapy Session Note  Patient Details  Name: Teresa [name] MRN: [id number] Date of Birth: 1941-01-10  Today's Date: 03/08/2021 OT Individual Time: 1400-1430 OT Individual Time Calculation (min): 30 min    Short Term Goals: Week 1:  OT Short Term Goal 1 (Week 1): Pt will don shirt with minA while maintaining balance EOB with min A OT Short Term Goal 2 (Week 1): Pt will transfer to toilet/ BSC with mod A consistently OT Short Term Goal 3 (Week 1): Pt will locate items on left side in ADL tasks with mod cues OT Short Term Goal 4 (Week 1): Pt will perform sit to stands for toileting tasks with mod A  Skilled Therapeutic Interventions/Progress Updates:  Pt greeted seated in recliner agreeable to OT intervention. Session focus on     AAROM with LUE and functional transfers. Pt completed seated horizontal shoulder ABD/ADD and shoulder flexion/ extension, 2x10 with towel placed under pts LUE and LUE positioned on table in front of pt and support provided at L elbow. Attempted bilateral integration task with weighted ball placed in pts R hand with L hand supported underneath ball and pt instructed to rotate wrists from supination<>pronation however pt reported increased pain in L thenar eminence therefore therapeutic activity suspended. Graded task down and worked on [employer] with un weighted dowel rod  secured to pt LUE, pt completed x10 reps of elbow flexion/extension, chest presses with support at L elbow and forward rows with support at L elbow. Pt completed stand pivot transfer back to bed with MIN A to rise from recliner and MOD A to pivot with Rw to EOB, most assist needed d/t lateral lean and cues needed for overall sequencing of transfer and body mechanics. Pt complete sit>supine with MIN A needing assist to manage trunk. pt left  supine in bed with all needs within reach and bed alarm activated.                     Therapy Documentation Precautions:  Precautions Precautions:  Fall Precaution Comments: L hemipareisis, L neglect/ inattention Restrictions Weight Bearing Restrictions: No Other Position/Activity Restrictions: shoulder sling for comfort with mobility -- only if it doesn't bother her R cervical spine     Therapy/Group: Individual Therapy  Precious Haws 03/08/2021, 3:52 PM

## 2021-03-09 MED ORDER — NISOLDIPINE ER 17 MG PO TB24
34.0000 mg | ORAL_TABLET | Freq: Every day | ORAL | Status: DC
  Administered 2021-03-10 – 2021-03-23 (×14): 34 mg via ORAL
  Filled 2021-03-09 (×15): qty 2

## 2021-03-09 MED ORDER — LOSARTAN POTASSIUM 25 MG PO TABS
25.0000 mg | ORAL_TABLET | Freq: Every day | ORAL | Status: DC
  Administered 2021-03-10 – 2021-03-13 (×4): 25 mg via ORAL
  Filled 2021-03-09 (×4): qty 1

## 2021-03-09 MED ORDER — NIFEDIPINE ER OSMOTIC RELEASE 30 MG PO TB24
30.0000 mg | ORAL_TABLET | Freq: Every day | ORAL | Status: DC
  Filled 2021-03-09: qty 1

## 2021-03-09 MED ORDER — HOME MED STORE IN PYXIS: 1.0000 | Freq: Every day | Status: DC

## 2021-03-09 NOTE — Progress Notes (Signed)
Patients spouse brought in nisoldipine 34 mg tabs. These were prescribed by her pcp, and he instructed her to bring them in I have made our MD aware, Raulkar, that she has them, will take them to the pharmcy

## 2021-03-09 NOTE — Progress Notes (Signed)
Physical Therapy Session Note  Patient Details  Name: Teresa [name] MRN: [id number] Date of Birth: 1940/11/20  Today's Date: 03/09/2021 PT Individual Time: 1435-1500 PT Individual Time Calculation (min): 25 min   Short Term Goals:  Week 2:  PT Short Term Goal 1 (Week 2): Patient will complete transfers with LRAD and MinA consistently PT Short Term Goal 2 (Week 2): Patient will ambulate >65ft with LRAD and MinA PT Short Term Goal 3 (Week 2): Patient will initiate stair training  Skilled Therapeutic Interventions/Progress Updates:   Pt received sitting in WC and agreeable to PT in room. Pt performed LAQ, hip flexion, hipb abduction, hip adduction, ankle DF. Each performed 2 x 10 with cues for attention to task on the LLE as well as decreased speed on BLE to improve neuromotor control. Pt declined any activity out of room or standing at this time. Left sitting in recline with call bell in reach and all needs met.          Therapy Documentation Precautions:  Precautions Precautions: Fall Precaution Comments: L hemipareisis, L neglect/ inattention Restrictions Weight Bearing Restrictions: No Other Position/Activity Restrictions: shoulder sling for comfort with mobility -- only if it doesn't bother her R cervical spine  Vital Signs: Therapy Vitals Temp: 98.5 F (36.9 C) Pulse Rate: 64 Resp: 17 BP: (!) 117/54 Patient Position (if appropriate): Lying Oxygen Therapy SpO2: 100 % O2 Device: Room Air Pain: Pain Assessment Pain Scale: 0-10 Pain Score: 0-No pain     Therapy/Group: Individual Therapy  Lorie Phenix 03/09/2021, 3:51 PM

## 2021-03-09 NOTE — Progress Notes (Signed)
Physical Therapy Weekly Progress Note  Patient Details  Name: Teresa [name] MRN: [id number] Date of Birth: May 30, 1940  Beginning of progress report period: March 02, 2021 End of progress report period: March 09, 2021  Today's Date: 03/09/2021 PT Individual Time: 0831-0930 PT Individual Time Calculation (min): 59 min   Patient has met 4 of 5 short term goals.  Patient is progressing well toward her goals. She remains limited by L hemiparesis resulting in increased assist needed for transfers and gait. She requires grossly ModA for functional mobility including ambulating 22ft with RW.   Patient continues to demonstrate the following deficits muscle weakness, decreased cardiorespiratoy endurance, impaired timing and sequencing, abnormal tone, unbalanced muscle activation, and decreased coordination, decreased motor planning, decreased initiation, decreased awareness, decreased problem solving, decreased safety awareness, and delayed processing, and decreased sitting balance, decreased standing balance, decreased postural control, hemiplegia, and decreased balance strategies and therefore will continue to benefit from skilled PT intervention to increase functional independence with mobility.  Patient progressing toward long term goals..  Continue plan of care.  PT Short Term Goals Week 1:  PT Short Term Goal 1 (Week 1): Pt will perform bed mobility with consistent MinA. PT Short Term Goal 1 - Progress (Week 1): Met PT Short Term Goal 2 (Week 1): Pt will perform sit<>stand transfers with consistent CGA. PT Short Term Goal 2 - Progress (Week 1): Met PT Short Term Goal 3 (Week 1): Pt will perform seat to seat transfers wiyh consistent MinA. PT Short Term Goal 3 - Progress (Week 1): Progressing toward goal PT Short Term Goal 4 (Week 1): Pt will initiate gait training. PT Short Term Goal 4 - Progress (Week 1): Met PT Short Term Goal 5 (Week 1): Pt will initiate w/c mobility training. PT  Short Term Goal 5 - Progress (Week 1): Met Week 2:  PT Short Term Goal 1 (Week 2): Patient will complete transfers with LRAD and MinA consistently PT Short Term Goal 2 (Week 2): Patient will ambulate >59ft with LRAD and MinA PT Short Term Goal 3 (Week 2): Patient will initiate stair training  Skilled Therapeutic Interventions/Progress Updates:    Patient received sitting up in wc, agreeable to PT. She denies pain, but endorses fatigue. TotalA donning socks and sneakers for time. [place] stand pivot to wc. PT transporting patient in wc to therapy gym for time management and energy conservation. Patient ambulating 76ft with RW (hand splint) and initially [place] regressing to MaxA due to fatigue. Compensatory adductor use to advance L LE with noted limb crossing midline at times, decreased L dorsiflexion/foot clearance through swing, poor engagement of L lateral hip stabilizers resulting in trendelenburg gait and increased L lateral trunk lean, incomplete weight shift R noted during R stance phase. MD in/out for assessment. Patient wanting her diet to be upgraded- PT deferring to SLP. Patient completing standing dynamic balance task toe tapping on 4" box with BUE support on RW + MinA. Emphasis on engagement of L lateral hip stabilizers during stance and quads for improved knee control. Patient with adequate foot clearance when advancing limb, difficulty coordinating hip flexion + knee flexion to bring leg back down. Patient returning to room in wc, ambulatory transfer to recliner. Chair alarm on, call light within reach.    Therapy Documentation Precautions:  Precautions Precautions: Fall Precaution Comments: L hemipareisis, L neglect/ inattention Restrictions Weight Bearing Restrictions: No Other Position/Activity Restrictions: shoulder sling for comfort with mobility -- only if it doesn't bother her R cervical spine  Therapy/Group: Individual Therapy  Debbora Dus 03/09/2021, 7:36 AM

## 2021-03-09 NOTE — Progress Notes (Signed)
Occupational Therapy Session Note  Patient Details  Name: Teresa [name] MRN: [id number] Date of Birth: 1940-08-21  Today's Date: 03/09/2021 OT Individual Time: 1352-1430 OT Individual Time Calculation (min): 38 min    Short Term Goals: Week 1:  OT Short Term Goal 1 (Week 1): Pt will don shirt with minA while maintaining balance EOB with min A OT Short Term Goal 2 (Week 1): Pt will transfer to toilet/ BSC with mod A consistently OT Short Term Goal 3 (Week 1): Pt will locate items on left side in ADL tasks with mod cues OT Short Term Goal 4 (Week 1): Pt will perform sit to stands for toileting tasks with mod A  Skilled Therapeutic Interventions/Progress Updates:    Pt received initially supine in bed, reporting that she needed to be cathed. LPN notified and to perform catheterization. Returned later and pt agreeable to therapy. Session focus on transfer retraining, activity tolerance, LUE NMR in prep for improved ADL/IADL/func mobility performance + decreased caregiver burden.  Came to sitting EOB with mod A to progress BLE off bed and to lift trunk. Squat-pivot to her R with min A to block L knee and to lift / pivot hips. Total A to don B shoes for time management. Seated in w/c, completed 1x20 of the following in prep for improved motor return of LUE: forward towel slides, shoulder internal/external rotation, scapular elevation/retraction. With baseball bat + ace wrap to facilitate L grip, pt able to complete 1x20: shoulder flexion, forward/backward circles with max A to support weight of LUE. C/o intermittent L lateral neck pain that ceases with rest and cold feet in setting of Raynaud's disease. Reports relief with heated blanket.  In room, stand-pivot  > recliner with mod A for balance and to progress LLE + RW and L saddle splint.   Pt left seated in recliner with safety belt alarm engaged, call bell in reach, and all immediate needs met.    Therapy Documentation Precautions:   Precautions Precautions: Fall Precaution Comments: L hemipareisis, L neglect/ inattention Restrictions Weight Bearing Restrictions: No Other Position/Activity Restrictions: shoulder sling for comfort with mobility -- only if it doesn't bother her R cervical spine  Pain:   See session note ADL: See Care Tool for more details.  Therapy/Group: Individual Therapy  Volanda Napoleon MS, OTR/L  03/09/2021, 6:53 AM

## 2021-03-09 NOTE — Progress Notes (Signed)
Physical Therapy Session Note  Patient Details  Name: Teresa [name] MRN: [id number] Date of Birth: January 21, 1941  Today's Date: 03/09/2021 PT Individual Time: 1109-1204 PT Individual Time Calculation (min): 55 min   Short Term Goals: Week 2:  PT Short Term Goal 1 (Week 2): Patient will complete transfers with LRAD and MinA consistently PT Short Term Goal 2 (Week 2): Patient will ambulate >25ft with LRAD and MinA PT Short Term Goal 3 (Week 2): Patient will initiate stair training  Skilled Therapeutic Interventions/Progress Updates:  Pt received sitting on [facility] with nurse present to assist with toileting. Pt agreeable to therapy session therefore this therapist assumed care of patient. Nurse requests pt to be returned to bed at end of session if she is not able to continently void bladder due to need for I&O cath - pt allowed increased time on [facility] and sound of water to aid in ability to continently void as well as attempts at gentle pressure to her lower abdomen over the bladder to assist with void, but pt unable (nurse notified).  Sit>stand BSC>stedy with min assist for rising up and L UE management onto stedy bar. Standing with CGA in stedy during total assist LB clothing management and peri-care - while standing pt noted to keep R knee fully extended with L knee flexed causing pelvic obliquity and rotation. Stedy transfer to w/c. Transported to/from gym in w/c for time management and energy conservation.   Sit>stand w/c>R UE support on litegait with light mod assist of 1 for lifting, balance, and guarding L knee for safety but no buckling noted (pt just keeps it flexed). Standing with light min assist donned litegait harness with +2 assist for time management - when therapist cued pt to stand up tall pt reports feeling as though she is standing straight with therapist having to clarify she needs to focus on L hip/knee extension.   Donned L LE DF assist ACE wrap. Gait training ~170ft 2x  overground in litegait harness providing balance support (but not body weight support) with mod assist for facilitating R weight shift onto R stance to allow increased L swing phase advancement and managing litegait harness - able to advance L LE without physical assist but intermittently does not step it through fully causing a step-to pattern with continuous toe drag - during 2nd trial pt demonstrates significant improvement in these gait deviations requiring only min assist towards end of walk to facilitate R weight shift onto R stance with pt achieving reciprocal stepping with L LE >75% of the time.  Standing as above, doffed litegait harness. Transported back to room. L squat pivot w/c>EOB with min assist for rotating hips. Sit>supine with CGA for trunk descent and pt able to bring L LE up onto bed without assist. Pt left supine in bed with needs in reach, L UE supported on pillows, and bed alarm on. Nurse notified of pt's position for I&O.   Therapy Documentation Precautions:  Precautions Precautions: Fall Precaution Comments: L hemipareisis, L neglect/ inattention Restrictions Weight Bearing Restrictions: No Other Position/Activity Restrictions: shoulder sling for comfort with mobility -- only if it doesn't bother her R cervical spine   Pain:  Reports some back discomfort upon attempts at standing up straight therefore modified this position during session for pain management.   Therapy/Group: Individual Therapy  Tawana Scale , PT, DPT, NCS, CSRS  03/09/2021, 8:01 AM

## 2021-03-09 NOTE — Progress Notes (Addendum)
PROGRESS NOTE   Subjective/Complaints: Her neck pain is much better controlled today Still feeling cold hands- has not yet noted benefit from Nifedipine. She thinks she was on extended release 30mg  at home- will bring her up to this dose and reduce Cozaar to 25mg .   ROS:  Pt deniesSOB, abd pain, CP, N/V/C/D, and vision changes  (+) neck pain, +urinary incontinence, +Raynaud's syndrome, +increased sleepiness from muscle relaxers   Objective:   No results found. No results for input(s): WBC, HGB, HCT, PLT in the last 72 hours.  Recent Labs    03/08/21 0459  CREATININE 1.42*     Intake/Output Summary (Last 24 hours) at 03/09/2021 1054 Last data filed at 03/09/2021 0730 Gross per 24 hour  Intake 510 ml  Output 1050 ml  Net -540 ml        Physical Exam: Vital Signs Blood pressure 138/65, pulse 63, temperature 98.1 F (36.7 C), resp. rate 18, height 5\' 2"  (1.575 m), weight 68.2 kg, SpO2 100 %.   General: awake, alert, appropriate, sitting up in bed; heat pack on neck; NT in room; NAD HENT: R gaze preference- can look Left with cues CV: regular rate; no JVD Pulmonary: CTA B/L; no W/R/R- good air movement GI: soft, NT, ND, (+)BS Psychiatric: appropriate; flat affect Neurological: alert  Musculoskeletal:     Cervical back: Normal range of motion. No rigidity.     Comments: RUE/RLE 5/5 LUE- 0/5, WE, grip and finger abd, 2/5 EF and EE LLE 2-/5 in HF.KE.DF and PF- no significant change today Tight cervical paraspinals  Performing leg exercises with therapy in gym Skin:    Comments: Buttocks no wounds 2 spots on back of thighs- scratch and birth mark Heels OK- no bogginess Squeezing abrasion of R upper arm from BP cuff  Cold hands and feet Neurological:     Comments: Patient is alert.  Moderate dysarthria.  Left noted facial droop.  She is able to name objects.  Follows simple commands Says light touch intact  in all 4 extremities, but has L inattention Dysarthric-   Cognitive deficits Psychiatric:     Comments: Extremely FLAT     Assessment/Plan: 1. Functional deficits which require 3+ hours per day of interdisciplinary therapy in a comprehensive inpatient rehab setting. Physiatrist is providing close team supervision and 24 hour management of active medical problems listed below. Physiatrist and rehab team continue to assess barriers to discharge/monitor patient progress toward functional and medical goals  Care Tool:  Bathing    Body parts bathed by patient: Face, Abdomen   Body parts bathed by helper: Right arm, Left arm, Chest, Front perineal area, Buttocks, Right upper leg, Left upper leg, Right lower leg, Left lower leg     Bathing assist Assist Level: Moderate Assistance - Patient 50 - 74%     Upper Body Dressing/Undressing Upper body dressing   What is the patient wearing?: Pull over shirt    Upper body assist Assist Level: Total Assistance - Patient < 25%    Lower Body Dressing/Undressing Lower body dressing      What is the patient wearing?: Pants, Incontinence brief     Lower body assist Assist  for lower body dressing: Total Assistance - Patient < 25%     Toileting Toileting    Toileting assist Assist for toileting: Moderate Assistance - Patient 50 - 74%     Transfers Chair/bed transfer  Transfers assist     Chair/bed transfer assist level: Moderate Assistance - Patient 50 - 74%     Locomotion Ambulation   Ambulation assist   Ambulation activity did not occur: Safety/medical concerns          Walk 10 feet activity   Assist  Walk 10 feet activity did not occur: Safety/medical concerns        Walk 50 feet activity   Assist Walk 50 feet with 2 turns activity did not occur: Safety/medical concerns         Walk 150 feet activity   Assist Walk 150 feet activity did not occur: Safety/medical concerns         Walk 10 feet on  uneven surface  activity   Assist Walk 10 feet on uneven surfaces activity did not occur: Safety/medical concerns         Wheelchair     Assist Is the patient using a wheelchair?: Yes Type of Wheelchair: Manual    Wheelchair assist level: Total Assistance - Patient < 25%, Dependent - Patient 0% Max wheelchair distance: 300 ft    Wheelchair 50 feet with 2 turns activity    Assist        Assist Level: Total Assistance - Patient < 25%   Wheelchair 150 feet activity     Assist      Assist Level: Total Assistance - Patient < 25%, Dependent - Patient 0%   Blood pressure 138/65, pulse 63, temperature 98.1 F (36.7 C), resp. rate 18, height 5\' 2"  (1.575 m), weight 68.2 kg, SpO2 100 %.  Medical Problem List and Plan: 1. 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 Impaired mobility: continue Lovenox             -antiplatelet therapy: Aspirin 81 mg daily and Plavix 75 mg day x3 weeks then Plavix alone 3. Neck pain: kpad added. continue tylenol prn. Add voltaren gel. Add flexeril 5mg  TID PRN. XR ordered.  4. Mood: Provide emotional support             -antipsychotic agents: N/A 5. Neuropsych: This patient is capable of making decisions on his own behalf. 6. Skin/Wound Care: Routine skin checks 7. Fluids/Electrolytes/Nutrition: Routine in and outs with follow-up chemistries 8.  Dysphagia.  Dysphagia #1 nectar liquids.  Follow-up speech therapy- might need IVFs at night since on nectar thick liquids- check labs in AM  12/10 Cr 0.94 and BUN 17- doing OK- will recheck Mondays. 9.  Hypertension.  Decrease Cozaar to 25mg  daily-may need to titrate further given restarting of home nisoldipine which helps with her Raynaud's. Monitor with increased mobility. Continue tizanidine at night which will also help her sleep. Husband brought in  [id number] mg that patient takes at home-placed order that she may use own med starting tomorrow 10.  Hyperlipidemia.  Crestor 11.  Hypothyroidism.  Synthroid 12.  Urinary retention.  d/c foley. Cannot use flomax due to sulfa allergy, continue urecholine- 5 mg TID and monitor 13.  AKI.  Baseline creatinine  1.0-1.4. Follow-up chemistries- esp in light of nectar thick liquids. Placed nursing order to encourage 6-8 glasses of thickened water per day.  14.  Obesity.  BMI 26.52.  Provided dietary education 15. Raynaud's syndrome: Husband brought in [id number] mg that patient takes at home-placed order that she may use own med starting tomorrow 16. Cognitive deficits: continue intensive SLP.  16. Disposition: HFU scheduled.   LOS: 8 days A FACE TO FACE EVALUATION WAS PERFORMED  Teresa [name] 03/09/2021, 10:54 AM

## 2021-03-10 NOTE — Progress Notes (Signed)
Physical Therapy Session Note  Patient Details  Name: Teresa [name] MRN: [id number] Date of Birth: [birth year]  Today's Date: 03/10/2021 PT Individual Time: 1017-1100 and 1401-1445 PT Individual Time Calculation (min): 43 min and 44 min  Short Term Goals: Week 1:  PT Short Term Goal 1 (Week 1): Pt will perform bed mobility with consistent MinA. PT Short Term Goal 1 - Progress (Week 1): Met PT Short Term Goal 2 (Week 1): Pt will perform sit<>stand transfers with consistent CGA. PT Short Term Goal 2 - Progress (Week 1): Met PT Short Term Goal 3 (Week 1): Pt will perform seat to seat transfers wiyh consistent MinA. PT Short Term Goal 3 - Progress (Week 1): Progressing toward goal PT Short Term Goal 4 (Week 1): Pt will initiate gait training. PT Short Term Goal 4 - Progress (Week 1): Met PT Short Term Goal 5 (Week 1): Pt will initiate w/c mobility training. PT Short Term Goal 5 - Progress (Week 1): Met  Skilled Therapeutic Interventions/Progress Updates:   First session:  Pt presents supine in bed and agreeable to therapy.  Pt rolls to left w/ min A but requires mod A for sidelying to sit 2/2 affected side.  Pt sat EOB and able to doff slipper sock right and don same in [place] position, but unable to complete on Left, falling to left w/o min A.  PT donned shoes for time conservation.  Pt transfers sit to stand w/ min A and performed SPT to right w/ min A but no stepping.  Pt wheeled to 5th floor gym for time and energy conservation.  Pt performed multiple sit to stand transfers w/ min A but verbal cues for sequencing, especially LLE placement.  Pt stood into RW and verbal and manual A for placing L hand in aide.  Pt performed toe-taps to 3" step w/ list to left and difficulty w/ lifting LLE.  Pt requires cueing for posture and L knee extension w/ WB.  Facilitation given at L elbow for UE WB.  Pt returned to room and performed squat pivot transfer w/c > recliner.  Chair alarm on and all needs  in reach.  Second session:  Pt presents supine in bed saying goodbye to friends.  Pt agreeable to therapy.  Pt transfers via logroll technique to left w/ mod A.  Pt sta EOB w/ 1 LOB while PT donning shoes.  Pt performs sit to stand transfer w/ min A and steps to w/c.  Ace wrap applied to LLE for improved dorsiflexiona nd pronation.  Pt amb x 70' x 3 w/ max A 2/2 flexed posture, decreased BOS left, and foot clearance.  Pt able to place L hand in aide w/ min A.  Pt requires verbal cues for posture, LLE placement w/ swing-through and the weight acceptance to LLE, maintaining extension in knee.  Pt returned to room and performed squat pivot transfer to recliner.  Chair alarm on and all needs in reach, spouse present.     Therapy Documentation Precautions:  Precautions Precautions: Fall Precaution Comments: L hemipareisis, L neglect/ inattention Restrictions Weight Bearing Restrictions: No Other Position/Activity Restrictions: shoulder sling for comfort with mobility -- only if it doesn't bother her R cervical spine General:   Vital Signs:   Pain:states no pain, but rubs neck. Pain Assessment Pain Scale: 0-10 Pain Score: 4  Pain Type: Acute pain Pain Location: Neck Pain Orientation: Posterior Pain Descriptors / Indicators: Aching Pain Frequency: Intermittent Pain Onset: On-going Pain Intervention(s): Repositioned;Medication (See eMAR)  Mobility:       Therapy/Group: Individual Therapy  Teresa [name] 03/10/2021, 11:01 AM

## 2021-03-11 MED ORDER — BETHANECHOL CHLORIDE 25 MG PO TABS
25.0000 mg | ORAL_TABLET | Freq: Three times a day (TID) | ORAL | Status: DC
  Administered 2021-03-11 – 2021-03-14 (×9): 25 mg via ORAL
  Filled 2021-03-11 (×9): qty 1

## 2021-03-11 NOTE — Progress Notes (Signed)
PROGRESS NOTE   Subjective/Complaints:  No issues overnite , Left arm feels cold   ROS:  Pt deniesSOB, abd pain, CP, N/V/C/D, and vision changes    Objective:   No results found. No results for input(s): WBC, HGB, HCT, PLT in the last 72 hours.  No results for input(s): NA, K, CL, CO2, GLUCOSE, BUN, CREATININE, CALCIUM in the last 72 hours.    Intake/Output Summary (Last 24 hours) at 03/11/2021 0927 Last data filed at 03/11/2021 0806 Gross per 24 hour  Intake 510 ml  Output 2700 ml  Net -2190 ml         Physical Exam: Vital Signs Blood pressure 122/64, pulse (!) 55, temperature (!) 97.4 F (36.3 C), resp. rate 16, height 5\' 2"  (1.575 m), weight 68.2 kg, SpO2 99 %.   General: No acute distress Mood and affect are appropriate Heart: Regular rate and rhythm no rubs murmurs or extra sounds Lungs: Clear to auscultation, breathing unlabored, no rales or wheezes Abdomen: Positive bowel sounds, soft nontender to palpation, nondistended Extremities: No clubbing, cyanosis, or edema Skin: No evidence of breakdown, no evidence of rash    Musculoskeletal:     Cervical back: Normal range of motion. No rigidity.     Comments: RUE/RLE 5/5 LUE- 0/5, WE, grip and finger abd, 2/5 EF and EE LLE 2-/5 in HF.KE.DF and PF- no significant change today  Skin:    Comments: Buttocks no wounds 2 spots on back of thighs- scratch and birth mark Heels OK- no bogginess Squeezing abrasion of R upper arm from BP cuff  Cold hands and feet Neurological:     Comments: Patient is alert.  Moderate dysarthria.  Left noted facial droop.  She is able to name objects.  Follows simple commands Sensation reduced to LT in LUE No evidence of vasomotor changes in hands No temp difference left vs right  Dysarthric-   Cognitive deficits Psychiatric:     Comments: Extremely FLAT     Assessment/Plan: 1. Functional deficits which require 3+  hours per day of interdisciplinary therapy in a comprehensive inpatient rehab setting. Physiatrist is providing close team supervision and 24 hour management of active medical problems listed below. Physiatrist and rehab team continue to assess barriers to discharge/monitor patient progress toward functional and medical goals  Care Tool:  Bathing    Body parts bathed by patient: Face, Abdomen   Body parts bathed by helper: Right arm, Left arm, Chest, Front perineal area, Buttocks, Right upper leg, Left upper leg, Right lower leg, Left lower leg     Bathing assist Assist Level: Moderate Assistance - Patient 50 - 74%     Upper Body Dressing/Undressing Upper body dressing   What is the patient wearing?: Pull over shirt    Upper body assist Assist Level: Total Assistance - Patient < 25%    Lower Body Dressing/Undressing Lower body dressing      What is the patient wearing?: Pants, Incontinence brief     Lower body assist Assist for lower body dressing: Total Assistance - Patient < 25%     Toileting Toileting    Toileting assist Assist for toileting: Moderate Assistance - Patient 50 -  74%     Transfers Chair/bed transfer  Transfers assist     Chair/bed transfer assist level: Minimal Assistance - Patient > 75%     Locomotion Ambulation   Ambulation assist   Ambulation activity did not occur: Safety/medical concerns  Assist level: Maximal Assistance - Patient 25 - 49% Assistive device: Walker-rolling Max distance: 70   Walk 10 feet activity   Assist  Walk 10 feet activity did not occur: Safety/medical concerns    Assistive device: Walker-rolling   Walk 50 feet activity   Assist Walk 50 feet with 2 turns activity did not occur: Safety/medical concerns  Assist level: Maximal Assistance - Patient 25 - 49% Assistive device: Walker-rolling    Walk 150 feet activity   Assist Walk 150 feet activity did not occur: Safety/medical concerns          Walk 10 feet on uneven surface  activity   Assist Walk 10 feet on uneven surfaces activity did not occur: Safety/medical concerns         Wheelchair     Assist Is the patient using a wheelchair?: Yes Type of Wheelchair: Manual    Wheelchair assist level: Total Assistance - Patient < 25%, Dependent - Patient 0% Max wheelchair distance: 300 ft    Wheelchair 50 feet with 2 turns activity    Assist        Assist Level: Total Assistance - Patient < 25%   Wheelchair 150 feet activity     Assist      Assist Level: Total Assistance - Patient < 25%, Dependent - Patient 0%   Blood pressure 122/64, pulse (!) 55, temperature (!) 97.4 F (36.3 C), resp. rate 16, height 5\' 2"  (1.575 m), weight 68.2 kg, SpO2 99 %.  Medical Problem List and Plan: 1. 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 Impaired mobility: continue Lovenox             -antiplatelet therapy: Aspirin 81 mg daily and Plavix 75 mg day x3 weeks then Plavix alone 3. Neck pain: kpad added. continue tylenol prn. Add voltaren gel. Add flexeril 5mg  TID PRN. XR ordered.  4. Mood: Provide emotional support             -antipsychotic agents: N/A 5. Neuropsych: This patient is capable of making decisions on his own behalf. 6. Skin/Wound Care: Routine skin checks 7. Fluids/Electrolytes/Nutrition: Routine in and outs with follow-up chemistries 8.  Dysphagia.  Dysphagia #1 nectar liquids.  Follow-up speech therapy- might need IVFs at night since on nectar thick liquids- check labs in AM  12/10 Cr 0.94 and BUN 17- doing OK- will recheck Mondays. 9.  Hypertension.  Decrease Cozaar to 25mg  daily-may need to titrate further given restarting of home nisoldipine which helps with her Raynaud's. Monitor with increased mobility. Continue tizanidine at night which will also help her sleep.  Husband brought in nisoldipine34 mg that patient takes at home-placed order that she may use own med starting tomorrow 10.  Hyperlipidemia.  Crestor 11.  Hypothyroidism.  Synthroid 12.  Urinary retention.  d/c foley. Cannot use flomax due to sulfa allergy, increase urecholine- 25mg  TID and monitor 13.  AKI.  Baseline creatinine 1.0-1.4. Follow-up chemistries- esp in light of nectar thick liquids. Placed nursing  order to encourage 6-8 glasses of thickened water per day.  14.  Obesity.  BMI 26.52.  Provided dietary education 15. Raynaud's syndrome: Husband brought in nisoldipine34 mg that patient takes at home-placed order that she may use own med starting tomorrow  Complaints of Left arm coldness is likely due to CVA sensory deficits as well as reduced muscular activity no appreciable temp difference noted left to RIght hand  16. Cognitive deficits: continue intensive SLP.  16. Disposition: HFU scheduled.   LOS: 10 days A FACE TO FACE EVALUATION WAS PERFORMED  Charlett Blake 03/11/2021, 9:27 AM

## 2021-03-11 NOTE — Progress Notes (Signed)
Occupational Therapy Session Note  Patient Details  Name: Teresa [name] MRN: [id number] Date of Birth: 1940/05/09  Today's Date: 03/12/2021 OT Individual Time: 1403-1501 OT Individual Time Calculation (min): 58 min   Short Term Goals: Week 1:  OT Short Term Goal 1 (Week 1): Pt will don shirt with minA while maintaining balance EOB with min A OT Short Term Goal 2 (Week 1): Pt will transfer to toilet/ BSC with mod A consistently OT Short Term Goal 3 (Week 1): Pt will locate items on left side in ADL tasks with mod cues OT Short Term Goal 4 (Week 1): Pt will perform sit to stands for toileting tasks with mod A  Skilled Therapeutic Interventions/Progress Updates:    Pt greeted in the recliner with no c/o pain. Agreeable to engage in BADLs. Pt required hand over hand assistance to use her affected Lt hand during UB self care, cues for attention to the Lt hand as well to keep affected limb in a protected position. CGA for sit<stand and pt required Mod balance assist in standing during LB self care when pt assisted with pericare and clothing. RW used for standing support. Mod A for self care overall. Pt completed scapular pinches and shoulder shrugs x5 reps 2 sets with tactile cues for Lt attention. Pt also with noted shoulder activation when completing internal and external rotation ROM. Pt remained sitting up at close of session, all needs within reach, Lt UE elevated, and safety belt fastened.    Therapy Documentation Precautions:  Precautions Precautions: Fall Precaution Comments: L hemipareisis, L neglect/ inattention Restrictions Weight Bearing Restrictions: No Other Position/Activity Restrictions: shoulder sling for comfort with mobility -- only if it doesn't bother her R cervical spine Vital Signs: Therapy Vitals Temp: (!) 97.5 F (36.4 C) Pulse Rate: 75 Resp: 17 BP: 117/60 Patient Position (if appropriate): Sitting Oxygen Therapy SpO2: 99 % O2 Device: Room  Air ADL: ADL Where Assessed-Eating: Edge of bed Grooming: Moderate assistance Where Assessed-Grooming: Wheelchair Upper Body Bathing: Maximal assistance Where Assessed-Upper Body Bathing: Edge of bed Lower Body Bathing: Maximal assistance Where Assessed-Lower Body Bathing: Edge of bed Upper Body Dressing: Maximal assistance Where Assessed-Upper Body Dressing: Edge of bed Lower Body Dressing: Maximal assistance Where Assessed-Lower Body Dressing: Edge of bed Toileting: Maximal assistance Toilet Transfer: Maximal assistance   Therapy/Group: Individual Therapy  Runa Whittingham A Juanjose Mojica 03/12/2021, 3:47 PM

## 2021-03-12 LAB — BASIC METABOLIC PANEL
Anion gap: 7 (ref 5–15)
BUN: 36 mg/dL — ABNORMAL HIGH (ref 8–23)
CO2: 27 mmol/L (ref 22–32)
Calcium: 9.3 mg/dL (ref 8.9–10.3)
Chloride: 104 mmol/L (ref 98–111)
Creatinine, Ser: 1.2 mg/dL — ABNORMAL HIGH (ref 0.44–1.00)
GFR, Estimated: 46 mL/min — ABNORMAL LOW (ref >60)
Glucose, Bld: 110 mg/dL — ABNORMAL HIGH (ref 70–99)
Potassium: 4.5 mmol/L (ref 3.5–5.1)
Sodium: 138 mmol/L (ref 135–145)

## 2021-03-12 MED ORDER — SODIUM CHLORIDE 0.45 % IV SOLN
720.0000 mL | Freq: Every day | INTRAVENOUS | Status: DC
  Administered 2021-03-12 – 2021-03-15 (×4): 720 mL via INTRAVENOUS

## 2021-03-12 MED ORDER — ENOXAPARIN SODIUM 40 MG/0.4ML IJ SOSY
40.0000 mg | PREFILLED_SYRINGE | INTRAMUSCULAR | Status: DC
  Administered 2021-03-12 – 2021-03-18 (×7): 40 mg via SUBCUTANEOUS
  Filled 2021-03-12 (×7): qty 0.4

## 2021-03-12 NOTE — Progress Notes (Signed)
Physical Therapy Session Note  Patient Details  Name: Teresa [name] MRN: [id number] Date of Birth: 06/23/1940  Today's Date: 03/12/2021 PT Individual Time: 1015-1128 PT Individual Time Calculation (min): 73 min   Short Term Goals: Week 2:  PT Short Term Goal 1 (Week 2): Patient will complete transfers with LRAD and MinA consistently PT Short Term Goal 2 (Week 2): Patient will ambulate >38ft with LRAD and MinA PT Short Term Goal 3 (Week 2): Patient will initiate stair training  Skilled Therapeutic Interventions/Progress Updates:     Pt sitting in recliner to start - agreeable to PT tx. Avoided pain questions due to pain focused behavior - treatment to tolerance with no signs or c/o pain during session. Discussed DC planning, PT POC, PT goals at start of session. Asked pt to obtain doorway widths and bed heights - wrote on paper in room for her to share with husband. Doffed hospital socks and donned socks/shoes with totalA for time. Pt completed stand<>pivot transfer with min/modA and no AD from recliner to w.c.   Transported downstairs to 33M rehab gym for time and energy conservation. Ace wrapped LLE for DF assist. Sit<>Stand to RW with CGA/minA and assist needed for LUE placement into RW orthotic. Instructed in gait training where she ambulated 2x78ft (seated rest) with minA, fading to [place] after ~60ft, and RW. Pt able to manage LLE without external assist where PT facilitating lateral weight shift, RW management.   Instructed in stair training, requiring min/modA and 1 hand rail for completing 2x4 steps (seated rest). Required max verbal cues for sequencing and step-to pattern. Increased difficulty with descent > ascent due to worsening L truncal lean.   Attempted w/c propulsion using BLE only to work on LLE  strengthening and motor planning. However, pt unable to adequately incorporate LLE into ext/flex sequencing and would over compensate by using RLE only.  Pt transported back  upstairs to her room for time and energy. Assisted to recliner via stand<>pivot transfer with RW and min/modA with VC needed for safety approach and fully turning prior to sitting. Completed session seated in recliner with BLE elevated, LUE supported with pillows, and safety belt alarm on. All needs in reach.   Therapy Documentation Precautions:  Precautions Precautions: Fall Precaution Comments: L hemipareisis, L neglect/ inattention Restrictions Weight Bearing Restrictions: No Other Position/Activity Restrictions: shoulder sling for comfort with mobility -- only if it doesn't bother her R cervical spine General:    Therapy/Group: Individual Therapy  Alger Simons 03/12/2021, 7:36 AM

## 2021-03-12 NOTE — Progress Notes (Addendum)
Speech Language Pathology Weekly Progress and Session Note  Patient Details  Name: Teresa [name] MRN: [id number] Date of Birth: Feb 15, 1941  Beginning of progress report period: March 02, 2021 End of progress report period: March 12, 2021  Today's Date: 03/12/2021 SLP Individual Time: 6213-0865 SLP Individual Time Calculation (min): 60 min  Short Term Goals: Week 1: SLP Short Term Goal 1 (Week 1): Patient will tolerate current diet (Dys 2, thin liquids) without overt s/s aspiration or penetration with min-modA cues for awareness and use of strategies. SLP Short Term Goal 1 - Progress (Week 1): Met SLP Short Term Goal 2 (Week 1): Patient will demonstrate adequate awareness to errors during completion of ADL and functional tasks, with min-modA verbal, visual cues. SLP Short Term Goal 2 - Progress (Week 1): Met SLP Short Term Goal 3 (Week 1): Patient will selectively attend and exhibit adequate turn taking and topic maintenance during conversation with SLP with minA verbal cues to redirect. SLP Short Term Goal 3 - Progress (Week 1): Met SLP Short Term Goal 4 (Week 1): Patient will participate in further assessment of complex level cognitive abilities. (money, medication management, etc). SLP Short Term Goal 4 - Progress (Week 1): Met SLP Short Term Goal 5 (Week 1): Patient will recall and return demonstrate safety strategies and exercises learned in PT/OT/ST sessions, with minA verbal/visual cues. SLP Short Term Goal 5 - Progress (Week 1): Met  New Short Term Goals: Week 2: SLP Short Term Goal 1 (Week 2): Patient will tolerate current diet (Dys 3, thin liquids) without overt s/s aspiration or penetration with minA cues for awareness and use of strategies. SLP Short Term Goal 2 (Week 2): Patient will recall and return demonstrate safety strategies and exercises learned in PT/OT/ST sessions, with Supervision A verbal/visual cues. SLP Short Term Goal 3 (Week 2): Pt will complete  medication/money management tasks with 90% accuracy provided min A verbal and visual cues SLP Short Term Goal 4 (Week 2): Pt will solve moderately complex problems related to current and discharge environment with adequate awareness of physical/cognitive changes s/p CVA with min A cues  Weekly Progress Updates: Pt has made excellent progress this reporting period and has met 5 out of 5 STGs due to increased error awareness, insight into current deficits and functional recall. Pt requiring less cues to scan left during tasks and is able to recall current precautions with overall min A. Pt diet was upgraded to Dys 3 with thin liquids (straw OK) due to increased oral phase efficiency and awareness of pocketed material. Pt continues to demonstrate some impulsive eating behaviors, continue full supervision for use of compensatory swallow strategies. Pt is responsive to ongoing education regarding ST POC, benefits from cues to recall and utilize throughout rehab stay.   Intensity: Minumum of 1-2 x/day, 30 to 90 minutes Frequency: 3 to 5 out of 7 days Duration/Length of Stay: 03/23/21 Treatment/Interventions: Cognitive remediation/compensation;Dysphagia/aspiration precaution training;Environmental controls;Medication managment;Functional tasks;Patient/family education;Therapeutic Exercise;Speech/Language facilitation   Daily Session  Skilled Therapeutic Interventions: Pt seen for skilled ST with focus on swallowing and cognitive goals, pt up in recliner and motivated for therapeutic tasks. Pt requesting diet upgrade, states Dys 2 texture is not appetizing and she is tolerating with no difficulty and minimal oral stasis/pocketing. Pt observed with trials of Dys 3 and regular solids, mild extended mastication noted however bolus prep and transit efficient. Pt was independent for small bites during this session with slow rate, however admits her baseline eating habits are impulsive and she  is having difficulty  changing (large bites, fast intake). Diet upgraded to Dys 3 with continued use of full supervision for swallow precautions at this time. Pt observed with thin liquids via cup with L anterior loss of bolus which patient was aware off. Pt consuming thin liquids via straw with single and large consecutive sips with no overt s/s aspiration, pt OK for straws with thin at this time. SLP facilitating visual scanning and problem solving task by providing min A cues for 90% accuracy. Pt left in recliner with chair alarm set and all needs within reach. Cont ST POC.      General    Pain Pain Assessment Pain Scale: 0-10 Pain Score: 0-No pain  Therapy/Group: Individual Therapy  Dewaine Conger 03/12/2021, 9:30 AM

## 2021-03-12 NOTE — Progress Notes (Signed)
PROGRESS NOTE   Subjective/Complaints:  Pt reports stiff and cramp in R hip.  Got tylenol  and muscle rub rubbed in- helped some.  LBM yesterday- no other issues.  ROS:   Pt denies SOB, abd pain, CP, N/V/C/D, and vision changes    Objective:   No results found. No results for input(s): WBC, HGB, HCT, PLT in the last 72 hours.  Recent Labs    03/12/21 0536  NA 138  K 4.5  CL 104  CO2 27  GLUCOSE 110*  BUN 36*  CREATININE 1.20*  CALCIUM 9.3     Intake/Output Summary (Last 24 hours) at 03/12/2021 1624 Last data filed at 03/12/2021 1300 Gross per 24 hour  Intake 840 ml  Output 1150 ml  Net -310 ml        Physical Exam: Vital Signs Blood pressure 117/60, pulse 75, temperature (!) 97.5 F (36.4 C), resp. rate 17, height 5\' 2"  (1.575 m), weight 68.2 kg, SpO2 99 %.    General: awake, alert, appropriate, sitting up in bed; nurses at bedside; NAD HENT: conjugate gaze; oropharynx moist CV: regular rate; no JVD Pulmonary: CTA B/L; no W/R/R- good air movement GI: soft, NT, ND, (+)BS Psychiatric: appropriate Neurological: L field cut/R gaze preference; alert   Musculoskeletal:     Cervical back: Normal range of motion. No rigidity.     Comments: RUE/RLE 5/5 LUE- 0/5, WE, grip and finger abd, 2/5 EF and EE LLE 2-/5 in HF.KE.DF and PF- no significant change today  Skin:    Comments: Buttocks no wounds 2 spots on back of thighs- scratch and birth mark Heels OK- no bogginess Squeezing abrasion of R upper arm from BP cuff  Cold hands and feet Neurological:     Comments: Patient is alert.  Moderate dysarthria.  Left noted facial droop.  She is able to name objects.  Follows simple commands Sensation reduced to LT in LUE No evidence of vasomotor changes in hands No temp difference left vs right  Dysarthric-   Cognitive deficits Psychiatric:     Comments: Extremely FLAT     Assessment/Plan: 1.  Functional deficits which require 3+ hours per day of interdisciplinary therapy in a comprehensive inpatient rehab setting. Physiatrist is providing close team supervision and 24 hour management of active medical problems listed below. Physiatrist and rehab team continue to assess barriers to discharge/monitor patient progress toward functional and medical goals  Care Tool:  Bathing    Body parts bathed by patient: Face, Abdomen   Body parts bathed by helper: Right arm, Left arm, Chest, Front perineal area, Buttocks, Right upper leg, Left upper leg, Right lower leg, Left lower leg     Bathing assist Assist Level: Moderate Assistance - Patient 50 - 74%     Upper Body Dressing/Undressing Upper body dressing   What is the patient wearing?: Pull over shirt    Upper body assist Assist Level: Total Assistance - Patient < 25%    Lower Body Dressing/Undressing Lower body dressing      What is the patient wearing?: Pants, Incontinence brief     Lower body assist Assist for lower body dressing: Total Assistance - Patient <  25%     Toileting Toileting    Toileting assist Assist for toileting: Moderate Assistance - Patient 50 - 74%     Transfers Chair/bed transfer  Transfers assist     Chair/bed transfer assist level: Minimal Assistance - Patient > 75%     Locomotion Ambulation   Ambulation assist   Ambulation activity did not occur: Safety/medical concerns  Assist level: Maximal Assistance - Patient 25 - 49% Assistive device: Walker-rolling Max distance: 70   Walk 10 feet activity   Assist  Walk 10 feet activity did not occur: Safety/medical concerns    Assistive device: Walker-rolling   Walk 50 feet activity   Assist Walk 50 feet with 2 turns activity did not occur: Safety/medical concerns  Assist level: Maximal Assistance - Patient 25 - 49% Assistive device: Walker-rolling    Walk 150 feet activity   Assist Walk 150 feet activity did not occur:  Safety/medical concerns         Walk 10 feet on uneven surface  activity   Assist Walk 10 feet on uneven surfaces activity did not occur: Safety/medical concerns         Wheelchair     Assist Is the patient using a wheelchair?: Yes Type of Wheelchair: Manual    Wheelchair assist level: Total Assistance - Patient < 25%, Dependent - Patient 0% Max wheelchair distance: 300 ft    Wheelchair 50 feet with 2 turns activity    Assist        Assist Level: Total Assistance - Patient < 25%   Wheelchair 150 feet activity     Assist      Assist Level: Total Assistance - Patient < 25%, Dependent - Patient 0%   Blood pressure 117/60, pulse 75, temperature (!) 97.5 F (36.4 C), resp. rate 17, height 5\' 2"  (1.575 m), weight 68.2 kg, SpO2 99 %.  Medical Problem List and Plan: 1. 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 Impaired mobility: continue Lovenox             -antiplatelet therapy: Aspirin 81 mg daily and Plavix 75 mg day x3 weeks then Plavix alone 3. Neck pain: kpad added. continue tylenol prn. Add voltaren gel. Add flexeril 5mg  TID PRN. XR ordered.   12/19- stable- con't meds prns- con't regimen 4. Mood: Provide emotional support             -antipsychotic agents: N/A 5. Neuropsych: This patient is capable of making decisions on his own behalf. 6. Skin/Wound Care: Routine skin checks 7. Fluids/Electrolytes/Nutrition: Routine in and outs with follow-up chemistries 8.  Dysphagia.  Dysphagia #1 nectar liquids.  Follow-up speech therapy- might need IVFs at night since on nectar thick liquids- check labs in AM  12/10 Cr 0.94 and BUN 17- doing OK- will recheck Mondays.  12/19- Cr 1.20 and BUN up to 36- was 19- will give IVFs at night- 60cc/hour 1/2NS- and recheck Wednesday.  9.  Hypertension.  Decrease Cozaar to 25mg  daily-may  need to titrate further given restarting of home nisoldipine which helps with her Raynaud's. Monitor with increased mobility. Continue tizanidine at night which will also help her sleep. Husband brought in nisoldipine34 mg that patient takes at home-placed order that she may use own med  starting tomorrow  12/19- BP controlled- con't regimen 10.  Hyperlipidemia.  Crestor 11.  Hypothyroidism.  Synthroid 12.  Urinary retention.  d/c foley. Cannot use flomax due to sulfa allergy, increase urecholine- 25mg  TID and monitor 13.  AKI.  Baseline creatinine 1.0-1.4. Follow-up chemistries- esp in light of nectar thick liquids. Placed nursing order to encourage 6-8 glasses of thickened water per day.   12/19- will start IVFs 1/2 NS 60cc/hour at night-and recheck labs Wednesday 14.  Obesity.  BMI 26.52.  Provided dietary education 15. Raynaud's syndrome: Husband brought in nisoldipine34 mg that patient takes at home-placed order that she may use own med starting tomorrow  Complaints of Left arm coldness is likely due to CVA sensory deficits as well as reduced muscular activity no appreciable temp difference noted left to RIght hand  16. Cognitive deficits: continue intensive SLP.  16. Disposition: HFU scheduled.   LOS: 11 days A FACE TO FACE EVALUATION WAS PERFORMED  Teresa [name] 03/12/2021, 4:24 PM

## 2021-03-12 NOTE — Plan of Care (Signed)
°  Problem: RH SAFETY Goal: RH STG ADHERE TO SAFETY PRECAUTIONS W/ASSISTANCE/DEVICE Description: STG Adhere to Safety Precautions With cues Assistance/Device. Outcome: Progressing   

## 2021-03-13 MED ORDER — POTASSIUM CHLORIDE 20 MEQ PO PACK
20.0000 meq | PACK | Freq: Two times a day (BID) | ORAL | Status: DC
  Administered 2021-03-13 – 2021-03-23 (×20): 20 meq via ORAL
  Filled 2021-03-13 (×20): qty 1

## 2021-03-13 NOTE — Progress Notes (Signed)
PROGRESS NOTE   Subjective/Complaints: Patient has no new complaints.  Things are going well in physical therapy according to PT. ROS:   Pt denies SOB, abd pain, CP, N/V/C/D, and vision changes    Objective:   No results found. No results for input(s): WBC, HGB, HCT, PLT in the last 72 hours.  Recent Labs    03/12/21 0536  NA 138  K 4.5  CL 104  CO2 27  GLUCOSE 110*  BUN 36*  CREATININE 1.20*  CALCIUM 9.3      Intake/Output Summary (Last 24 hours) at 03/13/2021 0855 Last data filed at 03/13/2021 0809 Gross per 24 hour  Intake 720 ml  Output 2100 ml  Net -1380 ml         Physical Exam: Vital Signs Blood pressure 118/62, pulse 60, temperature 98.2 F (36.8 C), resp. rate 17, height 5\' 2"  (1.575 m), weight 68.2 kg, SpO2 100 %.   General: No acute distress Mood and affect are appropriate Heart: Regular rate and rhythm no rubs murmurs or extra sounds Lungs: Clear to auscultation, breathing unlabored, no rales or wheezes Abdomen: Positive bowel sounds, soft nontender to palpation, nondistended Extremities: No clubbing, cyanosis, or edema Skin: No evidence of breakdown, no evidence of rash Musculoskeletal:     Cervical back: Normal range of motion. No rigidity.     Comments: RUE/RLE 5/5 LUE- 0/5, WE, grip and finger abd, 2/5 EF and EE LLE 2-/5 in HF.KE.DF and PF- no significant change today  Skin:    Comments: Buttocks no wounds 2 spots on back of thighs- scratch and birth mark Heels OK- no bogginess Squeezing abrasion of R upper arm from BP cuff  Cold hands and feet Neurological:     Comments: Patient is alert.  Moderate dysarthria.  Left noted facial droop.  She is able to name objects.  Follows simple commands Sensation reduced to LT in LUE No evidence of vasomotor changes in hands No temp difference left vs right  Dysarthric-   Cognitive deficits Psychiatric:     Comments: Extremely FLAT      Assessment/Plan: 1. Functional deficits which require 3+ hours per day of interdisciplinary therapy in a comprehensive inpatient rehab setting. Physiatrist is providing close team supervision and 24 hour management of active medical problems listed below. Physiatrist and rehab team continue to assess barriers to discharge/monitor patient progress toward functional and medical goals  Care Tool:  Bathing    Body parts bathed by patient: Face, Abdomen   Body parts bathed by helper: Right arm, Left arm, Chest, Front perineal area, Buttocks, Right upper leg, Left upper leg, Right lower leg, Left lower leg     Bathing assist Assist Level: Moderate Assistance - Patient 50 - 74%     Upper Body Dressing/Undressing Upper body dressing   What is the patient wearing?: Pull over shirt    Upper body assist Assist Level: Total Assistance - Patient < 25%    Lower Body Dressing/Undressing Lower body dressing      What is the patient wearing?: Pants, Incontinence brief     Lower body assist Assist for lower body dressing: Total Assistance - Patient < 25%  Toileting Toileting    Toileting assist Assist for toileting: Moderate Assistance - Patient 50 - 74%     Transfers Chair/bed transfer  Transfers assist     Chair/bed transfer assist level: Minimal Assistance - Patient > 75%     Locomotion Ambulation   Ambulation assist   Ambulation activity did not occur: Safety/medical concerns  Assist level: Maximal Assistance - Patient 25 - 49% Assistive device: Walker-rolling Max distance: 70   Walk 10 feet activity   Assist  Walk 10 feet activity did not occur: Safety/medical concerns    Assistive device: Walker-rolling   Walk 50 feet activity   Assist Walk 50 feet with 2 turns activity did not occur: Safety/medical concerns  Assist level: Maximal Assistance - Patient 25 - 49% Assistive device: Walker-rolling    Walk 150 feet activity   Assist Walk 150  feet activity did not occur: Safety/medical concerns         Walk 10 feet on uneven surface  activity   Assist Walk 10 feet on uneven surfaces activity did not occur: Safety/medical concerns         Wheelchair     Assist Is the patient using a wheelchair?: Yes Type of Wheelchair: Manual    Wheelchair assist level: Total Assistance - Patient < 25%, Dependent - Patient 0% Max wheelchair distance: 300 ft    Wheelchair 50 feet with 2 turns activity    Assist        Assist Level: Total Assistance - Patient < 25%   Wheelchair 150 feet activity     Assist      Assist Level: Total Assistance - Patient < 25%, Dependent - Patient 0%   Blood pressure 118/62, pulse 60, temperature 98.2 F (36.8 C), resp. rate 17, height 5\' 2"  (1.575 m), weight 68.2 kg, SpO2 100 %.  Medical Problem List and Plan: 1. 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 2. Impaired mobility: continue Lovenox             -antiplatelet therapy: Aspirin 81 mg daily and Plavix 75 mg day x3 weeks then Plavix alone, has 1 more week of aspirin 3. Neck pain: kpad added. continue tylenol prn. Add voltaren gel. Add flexeril 5mg  TID PRN. XR ordered.   12/19- stable- con't meds prns- con't regimen 4. Mood: Provide emotional support             -antipsychotic agents: N/A 5. Neuropsych: This patient is capable of making decisions on his own behalf. 6. Skin/Wound Care: Routine skin checks 7. Fluids/Electrolytes/Nutrition: Routine in and outs with follow-up chemistries 8.  Dysphagia.  Dysphagia #1 nectar liquids.  Follow-up speech therapy- might need IVFs at night since on nectar thick liquids- check labs in AM  12/10 Cr 0.94 and BUN 17- doing OK- will recheck Mondays.  12/19- Cr 1.20 and BUN up to 36- was 19- will give IVFs at night- 60cc/hour  1/2NS- and recheck Wednesday.  9.  Hypertension.  Decrease Cozaar to 25mg  daily-may need to titrate further given restarting of home nisoldipine which helps with her Raynaud's. Monitor with increased mobility. Continue tizanidine at night which will also help her sleep. Husband brought in nisoldipine34 mg that patient takes at home-placed order that she  may use own med starting tomorrow  12/19- BP controlled- con't regimen Vitals:   03/13/21 0458 03/13/21 0819  BP: 139/77 118/62  Pulse: 67 60  Resp: 17   Temp: 98.2 F (36.8 C)   SpO2: 100%     10.  Hyperlipidemia.  Crestor 11.  Hypothyroidism.  Synthroid 12.  Urinary retention.  d/c foley. Cannot use flomax due to sulfa allergy, increase urecholine- 25mg  TID and monitor 13.  AKI.  Baseline creatinine 1.0-1.4. Follow-up chemistries- esp in light of nectar thick liquids. Placed nursing order to encourage 6-8 glasses of thickened water per day.   12/19- will start IVFs 1/2 NS 60cc/hour at night-and recheck labs Wednesday 14.  Obesity.  BMI 26.52.  Provided dietary education 15. Raynaud's syndrome: Husband brought in nisoldipine34 mg that patient takes at home-placed order that she may use own med starting tomorrow  Complaints of Left arm coldness is likely due to CVA sensory deficits as well as reduced muscular activity no appreciable temp difference noted left to RIght hand  16. Cognitive deficits: continue intensive SLP.  16. Disposition: HFU scheduled.   LOS: 12 days A FACE TO FACE EVALUATION WAS PERFORMED  Charlett Blake 03/13/2021, 8:55 AM

## 2021-03-13 NOTE — Progress Notes (Signed)
Physical Therapy Session Note  Patient Details  Name: Teresa [name] MRN: [id number] Date of Birth: 21-Jan-1941  Today's Date: 03/13/2021 PT Individual Time: 1430-1530 PT Individual Time Calculation (min): 60 min   Short Term Goals: Week 2:  PT Short Term Goal 1 (Week 2): Patient will complete transfers with LRAD and MinA consistently PT Short Term Goal 2 (Week 2): Patient will ambulate >80ft with LRAD and MinA PT Short Term Goal 3 (Week 2): Patient will initiate stair training  Skilled Therapeutic Interventions/Progress Updates:     Pt received sitting in w/c, agreeable to PT tx. Daughter and family friend (neighbor) at bedside who were present throughout session. Pt reports no pain. Focused session on increasing activity tolerance, initiating family education with daughter, DC planning, gait training, and stair training. Lengthy discussion at start of session regarding Pt's current mobility status, PT POC, PT Goals, DC planning, home measurements, etc. All questions/concerns addressed at that time.   Pt transported downstairs to 57M rehab gym. Instructed in gait training with L ankle ace wrapped for DF assist. Using RW, she ambulated ~77ft (including x2 180deg turns) with minA to start but fading to [place] after ~25ft due to fatigue. Primary gait deficits include narrow BOS, adduction of LLE, L truncal lean. PT assisting facilitation and management of RW throughout.  Pt then instructed in stair training. She navigated up/down 2x4 6inch steps (seated rest) with min/modA and 1 hand rail. She required max cues for proper sequencing (ascent with R leading and descending with L leading) as pt was unable to recall from yesterday or b/w stair trials.  Pt returned upstairs to room and assisted to bed via stand<>pivot transfer with minA and use of bed rail. Required minA for sit>supine for LLE management and trunk support. Able to reposition herself in bed with verbal cueing.  Pt concluded session  supine in bed with family at bedside, bed alarm on, all immediate needs met.   Therapy Documentation Precautions:  Precautions Precautions: Fall Precaution Comments: L hemipareisis, L neglect/ inattention Restrictions Weight Bearing Restrictions: No Other Position/Activity Restrictions: shoulder sling for comfort with mobility -- only if it doesn't bother her R cervical spine General:    Therapy/Group: Individual Therapy  Keilyn Haggard P Aroldo Galli 03/13/2021, 7:30 AM

## 2021-03-13 NOTE — Progress Notes (Signed)
Speech Language Pathology Daily Session Note  Patient Details  Name: Ophia Shamoon MRN: [id number] Date of Birth: 1940/05/06  Today's Date: 03/13/2021 SLP Individual Time: 0830-0900 SLP Individual Time Calculation (min): 30 min  Short Term Goals: Week 2: SLP Short Term Goal 1 (Week 2): Patient will tolerate current diet (Dys 3, thin liquids) without overt s/s aspiration or penetration with minA cues for awareness and use of strategies. SLP Short Term Goal 2 (Week 2): Patient will recall and return demonstrate safety strategies and exercises learned in PT/OT/ST sessions, with Supervision A verbal/visual cues. SLP Short Term Goal 3 (Week 2): Pt will complete medication/money management tasks with 90% accuracy provided min A verbal and visual cues SLP Short Term Goal 4 (Week 2): Pt will solve moderately complex problems related to current and discharge environment with adequate awareness of physical/cognitive changes s/p CVA with min A cues  Skilled Therapeutic Interventions:  Pt seen for skilled ST with focus on cognitive goals, put sitting upright in recliner and motivated for therapeutic tasks. Pt states she is enjoying and tolerating Dys 3 diet with no difficulty. SLP facilitating functional math tasks by providing overall mod A visual and verbal cues to increase accuracy. 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 Pt is motivated to improve number comprehension at this time. SLP facilitating moderately complex problem recognition task for home environment by providing overall min A cues for insight into how physical and cognitive deficits will impact daily routine at discharge. SLP educating pt on recommendations for 24/7 supervision and assist with all high level cognitive tasks. Pt states daughter will be management bills at discharge but pt wants to utilize laptop like she was able to before. Pt left in recliner with alarm belt activated and all needs  within reach. Cont ST POC.   Pain Pain Assessment Pain Scale: 0-10 Pain Score: 0-No pain  Therapy/Group: Individual Therapy  Dewaine Conger 03/13/2021, 9:12 AM

## 2021-03-13 NOTE — Progress Notes (Signed)
Nutrition Follow-up  DOCUMENTATION CODES:   Not applicable  INTERVENTION:  Provide Ensure Enlive po BID, each supplement provides 350 kcal and 20 grams of protein   Encourage adequate PO intake.   NUTRITION DIAGNOSIS:   Increased nutrient needs related to  (therapy) as evidenced by estimated needs; ongoing  GOAL:   Patient will meet greater than or equal to 90% of their needs; progressing  MONITOR:   PO intake, Supplement acceptance, Labs, Weight trends, Diet advancement, Skin, I & O's  REASON FOR ASSESSMENT:    (Poor po)    ASSESSMENT:   80 year old female with history of hypothyroidism, Raynaud's disease presents with progressive onset of left-sided weakness and dysarthria. 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 Therapy evaluations completed due to patient's left-sided weakness and dysarthria was admitted for CIR.  Meal completion has been varied from 10-100% with 50% intake at lunch today. Pt currently has Ensure ordered and has been consuming them. RD to continue with current orders to aid in caloric and protein needs. Labs and medications reviewed.   Diet Order:   Diet Order             DIET DYS 3 Room service appropriate? Yes; Fluid consistency: Thin  Diet effective now                   EDUCATION NEEDS:   Not appropriate for education at this time  Skin:  Skin Assessment: Reviewed RN Assessment  Last BM:  12/19  Height:   Ht Readings from Last 1 Encounters:  03/01/21 5\' 2"  (1.575 m)    Weight:   Wt Readings from Last 1 Encounters:  03/01/21 68.2 kg    BMI:  Body mass index is 27.5 kg/m.  Estimated Nutritional Needs:   Kcal:  1700-1850  Protein:  85-95 grams  Fluid:  >/= 1.7 L/day  Corrin Parker, MS, RD, LDN RD pager number/after hours weekend pager number on Amion.

## 2021-03-13 NOTE — Progress Notes (Signed)
Occupational Therapy Weekly Progress Note  Patient Details  Name: Teresa [name] MRN: [id number] Date of Birth: Jun 15, 1940  Beginning of progress report period: March 02, 2021 End of progress report period: March 14, 2019  Today's Date: 03/13/2021 OT Individual Time: 0905-1005 OT Individual Time Calculation (min): 60 min    Patient has met 3 of 4 short term goals. Pt is making steady progress and showing return of LUE active movement as well as improved independence with self care tasks and functional mobility.  Pt needs min-mod assist for UB bathing and dressing now which is improved from needing max assist on IE.  Pt is also able to ambulate to bathroom and complete shower bench transfer with min-mod assist which is greatly improved from needing to complete at stand pivot level with max assist on IE.  Pt shows limited ROM left cervical rotation, moderate left inattention and neglect, and motor planning /strength impairments LUE and LLE which remain as primary barriers to progress.    Pt requires skilled OT to ensure maximized independence and safety achieved during self care and functional mobility.    Patient continues to demonstrate the following deficits: muscle weakness and muscle paralysis, decreased cardiorespiratoy endurance, impaired timing and sequencing, abnormal tone, motor apraxia, decreased coordination, and decreased motor planning, decreased visual perceptual skills, decreased midline orientation, decreased attention to left, left side neglect, decreased motor planning, and ideational apraxia, decreased initiation, decreased attention, decreased awareness, decreased problem solving, decreased safety awareness, decreased memory, and delayed processing, and decreased sitting balance, decreased standing balance, decreased postural control, hemiplegia, and decreased balance strategies and therefore will continue to benefit from skilled OT intervention to enhance overall  performance with BADL.  Patient progressing toward long term goals..  Continue plan of care.  OT Short Term Goals Week 1:  OT Short Term Goal 1 (Week 1): Pt will don shirt with minA while maintaining balance EOB with min A OT Short Term Goal 1 - Progress (Week 1): Progressing toward goal OT Short Term Goal 2 (Week 1): Pt will transfer to toilet/ BSC with mod A consistently OT Short Term Goal 2 - Progress (Week 1): Met OT Short Term Goal 3 (Week 1): Pt will locate items on left side in ADL tasks with mod cues OT Short Term Goal 3 - Progress (Week 1): Met OT Short Term Goal 4 (Week 1): Pt will perform sit to stands for toileting tasks with mod A OT Short Term Goal 4 - Progress (Week 1): Met Week 2:  OT Short Term Goal 1 (Week 2): Pt will complete donn/doff pants with min assist and mod multimodal cues. OT Short Term Goal 2 (Week 2): Pt will exhibit dynamic standing balance with CGA at RW for standing self care tasks. OT Short Term Goal 3 (Week 2): Pt will be able to actively rotate head to left approximatly 60 degrees or more to attend to left side. OT Short Term Goal 4 (Week 2): Pt will donn/doff shirt overhead with min assist and mod multimodal cues.  Skilled Therapeutic Interventions/Progress Updates:    Pt sitting up in recliner, requesting to shower, states she wishes she could urinate voluntarily so she can stop getting cath'd. No c/o pain. Pt excited to show therapist increased movement in left shoulder and elbow including abduction and elbow flexion.   MD made aware of bladder retention concerns.  Sit to stand with min assist including manual positioning of left hand on walker splint.  Ambulated to bathroom using RW with min  assist and max multimodal cues for safe RW management, weight shifting, and sequencing of gait.  Stand to sit at shower bench and slide/lift BLE over threshold with min assist and max cues.    Pt doffed shirt with min assist and improved recall of hemitechnique today.   Doffed pants and brief at sit<>stand level using grab bar for balance needing min assist overall.  Pt bathed UB and LB using lateral leans to wash peri area and buttocks with min assist overall. Throughout bathing, pt exhibited active movement of left shoulder and elbow in order to maintain extremity in supported position on shower bench with frequent intermittent cues to attend.   After drying off, pt completed shower bench transfer with BLE over threshold with mod assist due to difficulty sequencing despite max cues given.  Ambulated using RW with mod assist and max cues likely due to fatigue and needing more assist.    Pt donned shirt without initiating hemitechnique and therapist allowed time for pt self problem solving.  Pt did reason that hemitechnique would allow for more independence and ease.  Second attempt pt required min assist to thread left hand through long sleeve then pt able to complete remainder of task with cues only.   Donned brief with max assist and pants with mod assist.  Kpad applied to bilateral feet elevated per pt request.  LUE supported with pillow.  Call bell in reach, seat alarm on.    Therapy Documentation Precautions:  Precautions Precautions: Fall Precaution Comments: L hemipareisis, L neglect/ inattention Restrictions Weight Bearing Restrictions: No Other Position/Activity Restrictions: shoulder sling for comfort with mobility -- only if it doesn't bother her R cervical spine   Therapy/Group: Individual Therapy  Ezekiel Slocumb 03/13/2021, 11:44 AM

## 2021-03-13 NOTE — Progress Notes (Signed)
Physical Therapy Session Note  Patient Details  Name: Teresa [name] MRN: [id number] Date of Birth: Jul 05, 1940  Today's Date: 03/13/2021 PT Individual Time: 1130-1200 PT Individual Time Calculation (min): 30 min   Short Term Goals: Week 2:  PT Short Term Goal 1 (Week 2): Patient will complete transfers with LRAD and MinA consistently PT Short Term Goal 2 (Week 2): Patient will ambulate >20ft with LRAD and MinA PT Short Term Goal 3 (Week 2): Patient will initiate stair training  Skilled Therapeutic Interventions/Progress Updates:     Patient in recliner in the room upon PT arrival. Patient alert and agreeable to PT session. Patient denied pain during session, continues to report that she is "freezing" all the time. Patient requests to keep the door closed to keep the heat in the room, PT placed sign on patient's door stating such to reinforce with staff.   Patient expressed concerns about bladder and bowl management with poor control since her stroke. PT educated on timed toileting and the benefits of using the toilet or bedside commode at consistent intervals to retrain the muscles around the bowl and bladder. Patient reported that she had not voided since around 4am. PT educated on using a every 2 hours schedule to try to void or have a BM, reinforced with nursing staff following session and staff in agreement.   Focused session on in room mobility with RW and toileting to initiate timed toileting schedule. Patient performed sit to stand from the recliner with CGA and mod A for L hand placement in hand splint. She ambulated to and from the bathroom with min A with cues for increased abductor activation in L swing due to crossing over during foot placement, increased R weight and trunk shift to reduce L trunk lean, and increased L hamstring activation in pre-swing for improved foot clearance. Patient performed a toilet transfer with min A using the grab bar and RW. Patient was continent of  bowl and unsuccessful with voiding at this time. Performed peri-care with total A in standing and lower body clothing management with mod A for L side of pants and brief. Patient performed hand hygiene seated in the w/c at the sink with min cue for use of L hand throughout. Reviewed with the patient that she should toilet next between 1330 and 1400. Patient in agreement.   Patient in w/c in the room at end of session with breaks locked, seat belt alarm set, and all needs within reach. Thermostat turned up and multiple blankets provided for patient comfort at end of session.   Therapy Documentation Precautions:  Precautions Precautions: Fall Precaution Comments: L hemipareisis, L neglect/ inattention Restrictions Weight Bearing Restrictions: No Other Position/Activity Restrictions: shoulder sling for comfort with mobility -- only if it doesn't bother her R cervical spine    Therapy/Group: Individual Therapy  Lonney Revak L Aleksa Catterton PT, DPT  03/13/2021, 4:10 PM

## 2021-03-13 NOTE — Progress Notes (Signed)
Patient with upward trend in SCr likely due to AKI from retention. Is allergic to sulfa.  Also has upward trend in K+ -->will decrease K dur 20 meq bid and d/c Cozaar as BP trending down. Will await results of BMET in am. Will check CBC to monitor WBC --question UTI as cause of retention.

## 2021-03-14 LAB — CBC WITH DIFFERENTIAL/PLATELET
Abs Immature Granulocytes: 0.02 10*3/uL (ref 0.00–0.07)
Basophils Absolute: 0 10*3/uL (ref 0.0–0.1)
Basophils Relative: 1 %
Eosinophils Absolute: 0.2 10*3/uL (ref 0.0–0.5)
Eosinophils Relative: 3 %
HCT: 33.8 % — ABNORMAL LOW (ref 36.0–46.0)
Hemoglobin: 11.5 g/dL — ABNORMAL LOW (ref 12.0–15.0)
Immature Granulocytes: 0 %
Lymphocytes Relative: 26 %
Lymphs Abs: 1.8 10*3/uL (ref 0.7–4.0)
MCH: 30.1 pg (ref 26.0–34.0)
MCHC: 34 g/dL (ref 30.0–36.0)
MCV: 88.5 fL (ref 80.0–100.0)
Monocytes Absolute: 0.5 10*3/uL (ref 0.1–1.0)
Monocytes Relative: 8 %
Neutro Abs: 4.3 10*3/uL (ref 1.7–7.7)
Neutrophils Relative %: 62 %
Platelets: 243 10*3/uL (ref 150–400)
RBC: 3.82 MIL/uL — ABNORMAL LOW (ref 3.87–5.11)
RDW: 12.3 % (ref 11.5–15.5)
WBC: 6.9 10*3/uL (ref 4.0–10.5)
nRBC: 0 % (ref 0.0–0.2)

## 2021-03-14 LAB — URINALYSIS, ROUTINE W REFLEX MICROSCOPIC
Bilirubin Urine: NEGATIVE
Glucose, UA: NEGATIVE mg/dL
Hgb urine dipstick: NEGATIVE
Ketones, ur: NEGATIVE mg/dL
Nitrite: NEGATIVE
Protein, ur: NEGATIVE mg/dL
Specific Gravity, Urine: 1.009 (ref 1.005–1.030)
pH: 5 (ref 5.0–8.0)

## 2021-03-14 LAB — BASIC METABOLIC PANEL
Anion gap: 8 (ref 5–15)
BUN: 27 mg/dL — ABNORMAL HIGH (ref 8–23)
CO2: 27 mmol/L (ref 22–32)
Calcium: 9.3 mg/dL (ref 8.9–10.3)
Chloride: 102 mmol/L (ref 98–111)
Creatinine, Ser: 1.13 mg/dL — ABNORMAL HIGH (ref 0.44–1.00)
GFR, Estimated: 50 mL/min — ABNORMAL LOW (ref >60)
Glucose, Bld: 99 mg/dL (ref 70–99)
Potassium: 4.1 mmol/L (ref 3.5–5.1)
Sodium: 137 mmol/L (ref 135–145)

## 2021-03-14 MED ORDER — BETHANECHOL CHLORIDE 25 MG PO TABS
25.0000 mg | ORAL_TABLET | Freq: Four times a day (QID) | ORAL | Status: DC
  Administered 2021-03-14 – 2021-03-19 (×20): 25 mg via ORAL
  Filled 2021-03-14 (×20): qty 1

## 2021-03-14 NOTE — Progress Notes (Signed)
Physical Therapy Session Note  Patient Details  Name: Neena Beecham MRN: [id number] Date of Birth: Aug 15, 1940  Today's Date: 03/14/2021 PT Individual Time: 0801-0858 + 1403 - 1500 PT Individual Time Calculation (min): 57 min  + 57 min  Short Term Goals: Week 2:  PT Short Term Goal 1 (Week 2): Patient will complete transfers with LRAD and MinA consistently PT Short Term Goal 2 (Week 2): Patient will ambulate >53ft with LRAD and MinA PT Short Term Goal 3 (Week 2): Patient will initiate stair training  Skilled Therapeutic Interventions/Progress Updates:     1st session: Pt received supine in bed eating her breakfast, agreeable to PT tx. Reports 9/10 L shoulder pain. Provided soft tissue release, emotional support, and applied her TENS unit from home for pain management. TENS unit on throughout session, using preset continuous setting at level 60 intensity. Pt reports she used this setting at home when she experienced exacerbations in shldr pains. Focused session on increasing activity tolerance, pain management for shoulder, and functional mobility.  She was able to don regular socks and shoes at bed level with setupA via figure-4 technique. Required assist for tieing shoes and would benefit from elastic lases - will reach out to primary OT. Supine<>sit completed with supervision with HOB elevated, effortful related to shoulder pain. Spent some time doing soft tissue release to upper trap, scalenes, and cervicis muscles while she sat EOB. Pt subjectively reporting improved pain but ongoing. Assisted to w/c via squat<>pivot transfer with minA and mod cues for setup. Transported to [facility] rehab gym for time and energy conservation. Assisted onto Nustep in similar manner and then ace wrapped her paretic LUE to Nustep to work on [employer] for upper shoulder pain and to promote weight bearing. Workload set to 4, completed x11 minutes and 50 seconds total. Pt returned to w/c with minA stand<>pivot transfer  and transported back to her room where she was assisted to recliner in similar manner. Made comfortable with pillow supporting LUE and removed TENS. Pt reporting improved shoulder pain, rated 7/10.  Pt concluded session  with belt alarm on, all immediate needs met.  2nd session: Direct handoff of care from OT to start, pt sitting in w/c. Pt agreeable to PT tx. Reports some L sided neck pain. Provided TENS at end of session with settings listed as above, targeting upper trap - ed to remove after 30 min to allow muscles to relax. Lengthy discussion with pt's husband regarding DC planning topics, pt's anticipate mobility level at DC, r/u therapy recommendations, DME recommendations, etc. Asked for pictures of bathroom/bedroom setup to assist with preparing for transition home. Pt then transported downstairs to [facility] ortho gym. Worked on car transfers with car height simulating sedan. Required minA and mod verbal cueing for stand<>pivot transfers into and out of the car. Required assistance for managing LLE in/out of car as well. Pt reports they only own large SUV's, will problem solve this at later date and recommended to her that she use sedan's if possible. Pt then worked on dynamic standing balance and repeated sit<>stands. Sit<>Stands completed with minA but compensation with limited weightbearing on LLE. Provided mirror for visual feedback where she completed several more repetitions with increased LLE weightbearing to promote NMR. Challenged dynamic standing balance with alternating toe taps to 6inch platform using UE support on arm chair and minA for balance. Pt somewhat impulsive with stepping and required VC for slowing down movement patterns to optimize muscle activation and recovery. Pt returned upstairs to her  room. Concluded session seated in w/c with family and friends at the bedside. All needs met, TENS applied.    Therapy Documentation Precautions:  Precautions Precautions: Fall Precaution  Comments: L hemipareisis, L neglect/ inattention Restrictions Weight Bearing Restrictions: No Other Position/Activity Restrictions: shoulder sling for comfort with mobility -- only if it doesn't bother her R cervical spine General:    Therapy/Group: Individual Therapy  Alger Simons 03/14/2021, 7:33 AM

## 2021-03-14 NOTE — Progress Notes (Signed)
Patient ID: Teresa [name], female   DOB: 06/29/1940, 80 y.o.   MRN: [id number] Met with pt and husband to update team conference progress toward her goals of CGA-min assist level and discharge still 12/30. Will need to have husband and daughter come in next week for family training and have scheduled for Wed and Thursday from 1:00-4:00 pm each day. Discussed hopefully pt will be voiding and not need to I & O cath at home. Husband doesn't want to do this if possible and pt is willing to learn but doesn't want to either. Will continue to work toward discharge 12/30. Both agreeable to home health services and have no preference. °

## 2021-03-14 NOTE — Patient Care Conference (Signed)
Inpatient RehabilitationTeam Conference and Plan of Care Update Date: 03/14/2021   Time: 11:17 AM    Patient Name: Teresa [name] [facility]      Medical Record Number: [id number]  Date of Birth: March 05, 1941 Sex: Female         Room/Bed: 83C02C/5C02C-01 Payor Info: Payor: [employer] MEDICARE / Plan: [employer] MEDICARE CHOICE PPO / Product Type: *No Product type* /    Admit Date/Time:  03/01/2021  1:42 PM  Primary Diagnosis:  Right middle cerebral artery stroke [facility])  Hospital Problems: Principal Problem:   Right middle cerebral artery stroke [facility])    Expected Discharge Date: Expected Discharge Date: 03/23/21  Team Members Present: Physician leading conference: Dr. Alysia Penna Social Worker Present: Ovidio Kin, LCSW Nurse Present: Dorien Chihuahua, RN PT Present: Ginnie Smart, PT OT Present: Leretha Pol, OT SLP Present: Nadara Mode, SLP PPS Coordinator present : Gunnar Fusi, SLP     Current Status/Progress Goal Weekly Team Focus  Bowel/Bladder   Continent of bowel. In & out cath Q8hr no void. LBM 12/20  Regain ability to void  Cath Q8hr PRN for no void   Swallow/Nutrition/ Hydration   Dys 3, thin     tolerating Dys 3   ADL's   minA functional transfers and mod A ambulation within room, min-mod assist UB/LB self care, proximal LUE return 2-/5 developing, left inattention and limited left neck rotation present  min-supervision; may be able to upgrade goals if progress continues  LUE/LLE NMR, functional transfer training, hemi strategy training, self care and safety awareness training, dc planning, neck AROM/AAROM cervical rotations   Mobility   minA transfers, minA standing balance, modA gait ~1ft with RW  CGA transfers, minA gait  LLE NMR, gait training, functional transfers, RW management with transfers and gait, DC planning, family education training   Communication             Safety/Cognition/ Behavioral Observations            Pain   Pain to neck, Kpad in place.  Scheduled volteran gel  Pain <3/10  Assess Qshift and prn   Skin   skin intact  Maintain skin integrity.  Assess Qshift and prn     Discharge Planning:  Home with husband and daughter to assist when not [place] private duty list to hire assist if feels needed.   Team Discussion: Patient is improving. Enc po intake; IV fluids at HS. Urinary retention; requiring I+O catheterization q 8 hrs. UA questioned along with adding medication for retention. Neck pain/tightness treated with voltaren gel. Trouble with left LE adduction and poor proprioception.  Patient on target to meet rehab goals: yes, currently needs mod assist for ambulation due to motor planning issues and left LE proprioception deficits. Needs min - mod assist for bathing and dressing. Good carry over of hemi techniques. Needs min assist for stand pivot transfers to the w/c and min assist to stand due to left lean.  Able to ambulate up to 46' with min assist although requires more mod assist with fatigue. Goals for discharge set for  min assist - supervision level. Goals for discharge for cognition per SLP set for supervision.   *See Care Plan and progress notes for long and short-term goals.   Revisions to Treatment Plan:  Upgraded OT goals Gait training, neuro re-education Working on problem solving, awareness   Teaching Needs: Safety, transfers, toileting, medication management, I+O catheterization pending UA and urinary retention, not voiding on her own, secondary risk management, etc.  Current Barriers to Discharge: Decreased caregiver support and urinary retention  Possible Resolutions to Barriers: Family education with spouse and daughter Private duty list provided to family     Medical Summary Current Status: poor fluid intake , still on IVF at noc, Still on urecholine, needs I/O caths  Barriers to Discharge: Medical stability   Possible Resolutions to [employer] Focus: cont local care for neck pain, work  on Left neglect, Check UA, increase urecholine, toilet pt  to encourage voiding   Continued Need for Acute Rehabilitation Level of Care: The patient requires daily medical management by a physician with specialized training in physical medicine and rehabilitation for the following reasons: Direction of a multidisciplinary physical rehabilitation program to maximize functional independence : Yes Medical management of patient stability for increased activity during participation in an intensive rehabilitation regime.: Yes Analysis of laboratory values and/or radiology reports with any subsequent need for medication adjustment and/or medical intervention. : Yes   I attest that I was present, lead the team conference, and concur with the assessment and plan of the team.   Dorien Chihuahua B 03/14/2021, 11:49 AM

## 2021-03-14 NOTE — Progress Notes (Signed)
Physical Therapy Session Note  Patient Details  Name: Teresa [name] MRN: [id number] Date of Birth: Apr 30, 1940  Today's Date: 03/14/2021 PT Individual Time: 1102-1157 PT Individual Time Calculation (min): 55 min   Short Term Goals: Week 2:  PT Short Term Goal 1 (Week 2): Patient will complete transfers with LRAD and MinA consistently PT Short Term Goal 2 (Week 2): Patient will ambulate >51ft with LRAD and MinA PT Short Term Goal 3 (Week 2): Patient will initiate stair training  Skilled Therapeutic Interventions/Progress Updates:    Patient received sitting up in recliner, agreeable to PT. She reports 9/10 pain in L "shoulder," but points to her L neck. Premedicated. PT providing rest breaks, distractions and repositioning to assist with pain management. PT applying K-tape to assist with L shoulder sublux and gentle MFR to L supraspinatus, upper trap and rhomboids. Patient educated on use of theracane with some relief of pain reported. Patient ambulating 16ft with RW + ModA/MaxA. Manual facilitation of R weight shift to allow for adequate L foot clearance. Intermittent L knee buckling. Occasional incomplete weight shift R resulting in shortened L step and decreased foot clearance increasing LOB. Patient returning to room in wc, seatbelt alarm on, call light within reach, RN aware of patients pain level.   Therapy Documentation Precautions:  Precautions Precautions: Fall Precaution Comments: L hemipareisis, L neglect/ inattention Restrictions Weight Bearing Restrictions: No Other Position/Activity Restrictions: shoulder sling for comfort with mobility -- only if it doesn't bother her R cervical spine    Therapy/Group: Individual Therapy  Karoline Caldwell, PT, DPT, CBIS  03/14/2021, 7:39 AM

## 2021-03-14 NOTE — Progress Notes (Signed)
PROGRESS NOTE   Subjective/Complaints: BUN/Creat improving , WBC nl  Discussed need o increase po fluid intake, still on IVF at noc  ROS:   Pt denies SOB, abd pain, CP, N/V/C/D, and vision changes    Objective:   No results found. Recent Labs    03/14/21 0526  WBC 6.9  HGB 11.5*  HCT 33.8*  PLT 243    Recent Labs    03/12/21 0536 03/14/21 0526  NA 138 137  K 4.5 4.1  CL 104 102  CO2 27 27  GLUCOSE 110* 99  BUN 36* 27*  CREATININE 1.20* 1.13*  CALCIUM 9.3 9.3      Intake/Output Summary (Last 24 hours) at 03/14/2021 0900 Last data filed at 03/14/2021 0755 Gross per 24 hour  Intake 1202.33 ml  Output 3950 ml  Net -2747.67 ml         Physical Exam: Vital Signs Blood pressure 119/65, pulse 60, temperature 98.1 F (36.7 C), resp. rate 18, height _0  (1.575 m), weight 68.2 kg, SpO2 99 %.    General: No acute distress Mood and affect are appropriate Heart: Regular rate and rhythm no rubs murmurs or extra sounds Lungs: Clear to auscultation, breathing unlabored, no rales or wheezes Abdomen: Positive bowel sounds, soft nontender to palpation, nondistended Extremities: No clubbing, cyanosis, or edema Skin: No evidence of breakdown, no evidence of rash   Musculoskeletal:     Cervical back: Normal range of motion. No rigidity.     Comments: RUE/RLE 5/5 LUE- 0/5, WE, grip and finger abd, 2/5 EF and EE LLE 2-/5 in HF.KE.DF and PF- no significant change today  Skin:    Comments: Buttocks no wounds 2 spots on back of thighs- scratch and birth mark Heels OK- no bogginess Squeezing abrasion of R upper arm from BP cuff  Cold hands and feet Neurological:     Comments: Patient is alert.  Moderate dysarthria.  Left noted facial droop.  She is able to name objects.  Follows simple commands Sensation reduced to LT in LUE No evidence of vasomotor changes in hands No temp difference left vs right   Dysarthric-       Assessment/Plan: 1. Functional deficits which require 3+ hours per day of interdisciplinary therapy in a comprehensive inpatient rehab setting. Physiatrist is providing close team supervision and 24 hour management of active medical problems listed below. Physiatrist and rehab team continue to assess barriers to discharge/monitor patient progress toward functional and medical goals  Care Tool:  Bathing    Body parts bathed by patient: Face, Abdomen   Body parts bathed by helper: Right arm, Left arm, Chest, Front perineal area, Buttocks, Right upper leg, Left upper leg, Right lower leg, Left lower leg     Bathing assist Assist Level: Minimal Assistance - Patient > 75%     Upper Body Dressing/Undressing Upper body dressing   What is the patient wearing?: Pull over shirt    Upper body assist Assist Level: Moderate Assistance - Patient 50 - 74%    Lower Body Dressing/Undressing Lower body dressing      What is the patient wearing?: Pants, Incontinence brief     Lower body  assist Assist for lower body dressing: Moderate Assistance - Patient 50 - 74%     Toileting Toileting    Toileting assist Assist for toileting: Moderate Assistance - Patient 50 - 74%     Transfers Chair/bed transfer  Transfers assist     Chair/bed transfer assist level: Minimal Assistance - Patient > 75%     Locomotion Ambulation   Ambulation assist   Ambulation activity did not occur: Safety/medical concerns  Assist level: Maximal Assistance - Patient 25 - 49% Assistive device: Walker-rolling Max distance: 70   Walk 10 feet activity   Assist  Walk 10 feet activity did not occur: Safety/medical concerns    Assistive device: Walker-rolling   Walk 50 feet activity   Assist Walk 50 feet with 2 turns activity did not occur: Safety/medical concerns  Assist level: Maximal Assistance - Patient 25 - 49% Assistive device: Walker-rolling    Walk 150 feet  activity   Assist Walk 150 feet activity did not occur: Safety/medical concerns         Walk 10 feet on uneven surface  activity   Assist Walk 10 feet on uneven surfaces activity did not occur: Safety/medical concerns         Wheelchair     Assist Is the patient using a wheelchair?: Yes Type of Wheelchair: Manual    Wheelchair assist level: Total Assistance - Patient < 25%, Dependent - Patient 0% Max wheelchair distance: 300 ft    Wheelchair 50 feet with 2 turns activity    Assist        Assist Level: Total Assistance - Patient < 25%   Wheelchair 150 feet activity     Assist      Assist Level: Total Assistance - Patient < 25%, Dependent - Patient 0%   Blood pressure 119/65, pulse 60, temperature 98.1 F (36.7 C), resp. rate 18, height _0  (1.575 m), weight 68.2 kg, SpO2 99 %.  Medical Problem List and Plan: 1. 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 Formulized individual treatment plan based on medical history, underlying problem and comorbidities.  2. Impaired mobility: continue Lovenox             -antiplatelet therapy: Aspirin 81 mg daily and Plavix 75 mg day x3 weeks then Plavix alone, d/c ASA on 12/26 3. Neck pain: kpad added. continue tylenol prn. Add voltaren gel. Add flexeril 35m TID PRN. XR ordered.   12/19- stable- con't meds prns- con't regimen 4. Mood: Provide emotional support             -antipsychotic agents: N/A 5. Neuropsych: This patient is capable of making decisions on his own behalf. 6. Skin/Wound Care: Routine skin checks 7. Fluids/Electrolytes/Nutrition: Routine in and outs with follow-up chemistries 8.  Dysphagia.  Dysphagia #1 nectar liquids.   Follow-up speech therapy- might need IVFs at night since on nectar thick liquids- check labs in AM  12/10 Cr 0.94 and BUN 17- doing OK- will recheck Mondays.  12/19- Cr 1.20 and BUN up to 36- was 19- will give IVFs at night- 60cc/hour 1/2NS- and recheck Wednesday.  12/21- still has poor po fluid  intake, cont IVF at noc 9.  Hypertension.  Decrease Cozaar to 4m daily-may need to titrate further given restarting of home nisoldipine which helps with her Raynaud's. Monitor with increased mobility. Continue tizanidine at night which will also help her sleep. Husband brought in nisoldipine34 mg that patient takes at home-placed order that she may use own med starting tomorrow  12/19- BP controlled- con't regimen Vitals:   03/14/21 0404 03/14/21 0755  BP: (!) 116/56 119/65  Pulse: 60   Resp: 18   Temp: 98.1 F (36.7 C)   SpO2: 99%     10.  Hyperlipidemia.  Crestor 11.  Hypothyroidism.  Synthroid 12.  Urinary retention.  d/c foley. Cannot use flomax due to sulfa allergy, increase urecholine- 265mQID and monitor, cont I/O cath  13.  AKI.  Baseline creatinine 1.0-1.4. Follow-up chemistries- esp in light of nectar thick liquids. Placed nursing order to encourage 6-8 glasses of thickened water per day.   12/19- will start IVFs 1/2 NS 60cc/hour at night-and recheck labs Wednesday 14.  Obesity.  BMI 26.52.  Provided dietary education 15. Raynaud's syndrome: Husband brought in nisoldipine34 mg that patient takes at home-placed order that she may use own med starting tomorrow  Complaints of Left arm coldness is likely due to CVA sensory deficits as well as reduced muscular activity no appreciable temp difference noted left to RIght hand  16. Cognitive deficits:higher level cont SLP.  16. Disposition: HFU scheduled.   LOS: 13 days A FACE TO FACE EVALUATION WAS PERFORMED  AnCharlett Blake2/21/2022, 9:00 AM

## 2021-03-14 NOTE — Progress Notes (Addendum)
Occupational Therapy Session Note  Patient Details  Name: Teresa [name] MRN: [id number] Date of Birth: June 26, 1940  Today's Date: 03/14/2021 OT Individual Time: 1330-1400 OT Individual Time Calculation (min): 30 min    Short Term Goals: Week 2:  OT Short Term Goal 1 (Week 2): Pt will complete donn/doff pants with min assist and mod multimodal cues. OT Short Term Goal 2 (Week 2): Pt will exhibit dynamic standing balance with CGA at RW for standing self care tasks. OT Short Term Goal 3 (Week 2): Pt will be able to actively rotate head to left approximatly 60 degrees or more to attend to left side. OT Short Term Goal 4 (Week 2): Pt will donn/doff shirt overhead with min assist and mod multimodal cues.  Skilled Therapeutic Interventions/Progress Updates:    Pt sitting up in w/c, c/o 9/10 pain in left side of neck near superior angle of scapula.  Pt transported to ortho gym and completed squat pivot with min assist to EOM.  Sit to supine also with min assist.  Soft tissue massage and trigger point release performed bilateral levator scapulae, scalenes, and upper traps.  Gentle AAROM in gravity assisted position completed left cervical rotation x 10 reps with 15 second holds.  Supine to sit with mod assist.  Educated pt on avoiding repetitive shoulder shrugs (noted pt shrugging during transport to gym) but instead emphasized shoulder depression to reduce overuse of aggravated musculature.  Squat pivot to w/c, Returned to room, direct hand off to PT.   Therapy Documentation Precautions:  Precautions Precautions: Fall Precaution Comments: L hemipareisis, L neglect/ inattention Restrictions Weight Bearing Restrictions: No Other Position/Activity Restrictions: shoulder sling for comfort with mobility -- only if it doesn't bother her R cervical spine   Therapy/Group: Individual Therapy  Ezekiel Slocumb 03/14/2021, 3:20 PM

## 2021-03-15 DIAGNOSIS — I7300 Raynaud's syndrome without gangrene: Secondary | ICD-10-CM

## 2021-03-15 DIAGNOSIS — I10 Essential (primary) hypertension: Secondary | ICD-10-CM

## 2021-03-15 DIAGNOSIS — R7989 Other specified abnormal findings of blood chemistry: Secondary | ICD-10-CM

## 2021-03-15 LAB — CREATININE, SERUM
Creatinine, Ser: 1.13 mg/dL — ABNORMAL HIGH (ref 0.44–1.00)
GFR, Estimated: 50 mL/min — ABNORMAL LOW (ref >60)

## 2021-03-15 NOTE — Progress Notes (Addendum)
Occupational Therapy Session Note  Patient Details  Name: Teresa [name] MRN: [id number] Date of Birth: [birth year]  Today's Date: 03/15/2021 OT Individual Time: 1100-1130 ; 1330-1430 OT Individual Time Calculation (min): 30 min ; and 60 min   Short Term Goals: Week 2:  OT Short Term Goal 1 (Week 2): Pt will complete donn/doff pants with min assist and mod multimodal cues. OT Short Term Goal 2 (Week 2): Pt will exhibit dynamic standing balance with CGA at RW for standing self care tasks. OT Short Term Goal 3 (Week 2): Pt will be able to actively rotate head to left approximatly 60 degrees or more to attend to left side. OT Short Term Goal 4 (Week 2): Pt will donn/doff shirt overhead with min assist and mod multimodal cues.  Skilled Therapeutic Interventions/Progress Updates:    First session: Pt sitting up in w/c, reports her neck feels much better today. Reports left arm trough does not feel supportive enough and feels this may be partly causing neck pain.  Provision of left half lap tray with folded towel and pt reports improved comfort and support.  Pt transported to ortho gym and completed squat pivot with min assist to EOM.  Sit to supine also with min assist.  Soft tissue massage and trigger point release performed left levator scapulae, scalenes, and upper traps.  Gentle AAROM in gravity assisted position completed left cervical rotation x 10 reps with 15 second holds. Noted increased mobility left cervical rotation today compared to yesterday.  Supine to sit with mod assist. Squat pivot to w/c, Returned to room with call bell in reach, seat alarm on.  Second session:   Semireclined in bed, requesting to shower. States her neck still feeling better since last session.  Supine to sit with min assist using bed rails.  Pt needing min assist to maintain static sitting balance due to multiple LOB to left while EOB.  Sit to stand with min assist including manual positioning of left hand on  walker splint.  Ambulated to bathroom using RW with min assist and max multimodal cues for safe RW management, weight shifting, and sequencing of gait.  Stand to sit at shower bench and slide/lift BLE over threshold with min assist and mod cues.     Pt doffed shirt with CGA and no cues needed for technique.  Doffed pants and brief at sit<>stand level using grab bar for balance needing min assist overall.  Pt bathed UB and LB using lateral leans to wash peri area and buttocks with min assist. Pt able to use LUE to wash top of left thigh using active shoulder forward flexion and extension. Pt also able to wash RUE with therapist providing manual support to upper arm and pt flexing /extending elbow actively to complete task. After drying off, pt completed shower bench transfer with BLE over threshold with min assist and max multimodal cues for safe transfer technique due to pt standing up without warning and attempting to step over large threshold. Ambulated using RW with min assist and max cues.    Pt donned shirt without this time initiating hemitechnique accurately however unable to recall next step in sequence needing assist and multimodal cues to fully thread LUE prior to donning over rest of UB.  Donned brief with max assist and pants with mod assist needing cues to recall hemitechnique. Max multimodal cues and min assist needed to side step to left towards HOB using RW. Pt requesting to return to supine needing min assist.  Educated pt on HEP of supine elbow flexion, elbow extension (against gravity with support under upper arm), and shoulder abduction (with elbow bent).  Pt completed each x 10 reps.  OT noted new digital AROM flexion and extension of all digits including thumb.  Provided sponge for HEP finger flex/ext. LUE supported with pillow.  Call bell in reach, bed alarm on.    Therapy Documentation Precautions:  Precautions Precautions: Fall Precaution Comments: L hemipareisis, L neglect/  inattention Restrictions Weight Bearing Restrictions: No Other Position/Activity Restrictions: shoulder sling for comfort with mobility -- only if it doesn't bother her R cervical spine    Therapy/Group: Individual Therapy  Ezekiel Slocumb 03/15/2021, 1:09 PM

## 2021-03-15 NOTE — Progress Notes (Signed)
Speech Language Pathology Daily Session Note  Patient Details  Name: Teresa [name] MRN: [id number] Date of Birth: 02/20/1941  Today's Date: 03/15/2021 SLP Individual Time: 0930-1015 SLP Individual Time Calculation (min): 45 min  Short Term Goals: Week 2: SLP Short Term Goal 1 (Week 2): Patient will tolerate current diet (Dys 3, thin liquids) without overt s/s aspiration or penetration with minA cues for awareness and use of strategies. SLP Short Term Goal 2 (Week 2): Patient will recall and return demonstrate safety strategies and exercises learned in PT/OT/ST sessions, with Supervision A verbal/visual cues. SLP Short Term Goal 3 (Week 2): Pt will complete medication/money management tasks with 90% accuracy provided min A verbal and visual cues SLP Short Term Goal 4 (Week 2): Pt will solve moderately complex problems related to current and discharge environment with adequate awareness of physical/cognitive changes s/p CVA with min A cues  Skilled Therapeutic Interventions:   Patient seen for skilled ST session focused on cognitive function goals. Patient sitting in [facility] and talking on phone but ended phone call when SLP entered room. Of note, she appeared more expressive with varied intonation in voice. She was receptive towards completing various logic puzzles. She did have difficulty with alternating attention and described tasks as "too hard". She did benefit from further instruction, review of instructions and cues to use process of elimination. SLP left some logic puzzles with patient and when returned later in morning, she was working on them independently. Patient also reporting that she likes the upgraded solid textures (dys 3 solids). She was left in [facility] with all needs within reach. She continues to benefit from skilled SLP intervention to maximize cognitive-linguistic and swallow function goals prior to discharge.  Pain Pain Assessment Pain Scale: 0-10 Pain Score: 0-No pain Faces  Pain Scale: No hurt  Therapy/Group: Individual Therapy  Sonia Baller, MA, CCC-SLP Speech Therapy

## 2021-03-15 NOTE — Progress Notes (Addendum)
PROGRESS NOTE   Subjective/Complaints: Pt in good spirits. Just was walking with PT this morning and felt it did well. Some left trap pain d/t trying to "heave" left arm  ROS: Patient denies fever, rash, sore throat, blurred vision, nausea, vomiting, diarrhea, cough, shortness of breath or chest pain, joint or back pain, headache, or mood change.    Objective:   No results found. Recent Labs    03/14/21 0526  WBC 6.9  HGB 11.5*  HCT 33.8*  PLT 243    Recent Labs    03/14/21 0526 03/15/21 0526  NA 137  --   K 4.1  --   CL 102  --   CO2 27  --   GLUCOSE 99  --   BUN 27*  --   CREATININE 1.13* 1.13*  CALCIUM 9.3  --      Intake/Output Summary (Last 24 hours) at 03/15/2021 1135 Last data filed at 03/15/2021 0900 Gross per 24 hour  Intake 694 ml  Output 3700 ml  Net -3006 ml        Physical Exam: Vital Signs Blood pressure 135/73, pulse 73, temperature 97.6 F (36.4 C), resp. rate 16, height 5\' 2"  (1.575 m), weight 68.2 kg, SpO2 97 %.    Constitutional: No distress . Vital signs reviewed. HEENT: NCAT, EOMI, oral membranes moist Neck: supple Cardiovascular: RRR without murmur. No JVD    Respiratory/Chest: CTA Bilaterally without wheezes or rales. Normal effort    GI/Abdomen: BS +, non-tender, non-distended Ext: no clubbing, cyanosis, or edema Psych: pleasant and cooperative  Skin: No evidence of breakdown, no evidence of rash Musculoskeletal:     Cervical back: Normal range of motion. No rigidity.     Comments: RUE/RLE 5/5 LUE- trace WE, grip and finger abd, 2/5 EF and EE LLE 2- to 2/5 in HF.KE.DF and PF- stable to improved  Skin:    Comments: Buttocks no wounds 2 spots on back of thighs- scratch and birth mark Heels OK- no bogginess Squeezing abrasion of R upper arm from BP cuff  Cold hands and feet Neurological:     Comments: Patient is alert.  Moderate dysarthria.  Left noted facial droop.   She is able to name objects.  Follows simple commands Sensation reduced to LT in LUE No evidence of vasomotor changes in hands No temp difference left vs right  Dysarthric-       Assessment/Plan: 1. Functional deficits which require 3+ hours per day of interdisciplinary therapy in a comprehensive inpatient rehab setting. Physiatrist is providing close team supervision and 24 hour management of active medical problems listed below. Physiatrist and rehab team continue to assess barriers to discharge/monitor patient progress toward functional and medical goals  Care Tool:  Bathing    Body parts bathed by patient: Face, Abdomen   Body parts bathed by helper: Right arm, Left arm, Chest, Front perineal area, Buttocks, Right upper leg, Left upper leg, Right lower leg, Left lower leg     Bathing assist Assist Level: Minimal Assistance - Patient > 75%     Upper Body Dressing/Undressing Upper body dressing   What is the patient wearing?: Pull over shirt  Upper body assist Assist Level: Moderate Assistance - Patient 50 - 74%    Lower Body Dressing/Undressing Lower body dressing      What is the patient wearing?: Pants, Incontinence brief     Lower body assist Assist for lower body dressing: Moderate Assistance - Patient 50 - 74%     Toileting Toileting    Toileting assist Assist for toileting: Moderate Assistance - Patient 50 - 74%     Transfers Chair/bed transfer  Transfers assist     Chair/bed transfer assist level: Minimal Assistance - Patient > 75%     Locomotion Ambulation   Ambulation assist   Ambulation activity did not occur: Safety/medical concerns  Assist level: Maximal Assistance - Patient 25 - 49% Assistive device: Walker-rolling Max distance: 70   Walk 10 feet activity   Assist  Walk 10 feet activity did not occur: Safety/medical concerns    Assistive device: Walker-rolling   Walk 50 feet activity   Assist Walk 50 feet with 2 turns  activity did not occur: Safety/medical concerns  Assist level: Maximal Assistance - Patient 25 - 49% Assistive device: Walker-rolling    Walk 150 feet activity   Assist Walk 150 feet activity did not occur: Safety/medical concerns         Walk 10 feet on uneven surface  activity   Assist Walk 10 feet on uneven surfaces activity did not occur: Safety/medical concerns         Wheelchair     Assist Is the patient using a wheelchair?: Yes Type of Wheelchair: Manual    Wheelchair assist level: Total Assistance - Patient < 25%, Dependent - Patient 0% Max wheelchair distance: 300 ft    Wheelchair 50 feet with 2 turns activity    Assist        Assist Level: Total Assistance - Patient < 25%   Wheelchair 150 feet activity     Assist      Assist Level: Total Assistance - Patient < 25%, Dependent - Patient 0%   Blood pressure 135/73, pulse 73, temperature 97.6 F (36.4 C), resp. rate 16, height 5\' 2"  (1.575 m), weight 68.2 kg, SpO2 97 %.  Medical Problem List and Plan: 1. 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  2. Impaired mobility: continue Lovenox             -antiplatelet therapy: Aspirin 81 mg daily and Plavix 75 mg day x3 weeks then Plavix alone, d/c ASA on 12/26 3. Neck pain: kpad added. continue tylenol prn. Continue voltaren gel and flexeril 5mg  TID PRN. 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   12/22 meds seem to be working for her  -discussed importance of shoulder support, proper posture, and good mechanics. She can't try to force things to happen 4. Mood: Provide emotional support             -antipsychotic agents: N/A 5. Neuropsych: This patient is capable of making decisions on his own behalf. 6. Skin/Wound Care: Routine skin checks 7. Fluids/Electrolytes/Nutrition: Routine in and outs with  follow-up chemistries 8.  Dysphagia.  Dysphagia #1 nectar liquids.  Follow-up speech therapy- might need IVFs at night since on nectar thick liquids- check labs in AM  12/22 labs stable but BUN still  elevated  -continue HS IVF, recheck BMET tomorrow 9.  Hypertension.  Decrease Cozaar to 25mg  daily-may need to titrate further given restarting of home nisoldipine which helps with her Raynaud's. Monitor with increased mobility. Continue tizanidine at night which will also help her sleep. Husband brought in nisoldipine34 mg that patient takes at home-placed order that she may use own med starting tomorrow  12/22 controled Vitals:   03/14/21 1945 03/15/21 0452  BP: 107/66 135/73  Pulse: 72 73  Resp: 17 16  Temp: 98 F (36.7 C) 97.6 F (36.4 C)  SpO2: 97% 97%    10.  Hyperlipidemia.  Crestor 11.  Hypothyroidism.  Synthroid 12.  Urinary retention.   Cannot use flomax due to sulfa allergy, -12/22 increased urecholine- 25mg  QID 12/21 - cont I/O cath as needed -oob to void -ivf doesn't help 13.  AKI.  Baseline creatinine 1.0-1.4. Follow-up chemistries- esp in light of nectar thick liquids. Placed nursing order to encourage 6-8 glasses of thickened water per day.   See abovef 14.  Obesity.  BMI 26.52.  Provided dietary education 15. Raynaud's syndrome: Husband brought in nisoldipine34 mg which she's now taking  -discussed her other blood thinners and that they wouldn't make her sx worse  -Complaints of Left arm coldness is likely due to CVA sensory deficits as well as reduced muscular activity no appreciable temp difference noted left to RIght hand --reiterated today 16. Cognitive deficits:higher level cont SLP.      LOS: 14 days A FACE TO FACE EVALUATION WAS PERFORMED  Meredith Staggers 03/15/2021, 11:35 AM

## 2021-03-15 NOTE — Progress Notes (Signed)
Physical Therapy Session Note  Patient Details  Name: Teresa [name] MRN: [id number] Date of Birth: [birth year]  Today's Date: 03/15/2021 PT Individual Time: 0800-0858 PT Individual Time Calculation (min): 58 min   Short Term Goals: Week 2:  PT Short Term Goal 1 (Week 2): Patient will complete transfers with LRAD and MinA consistently PT Short Term Goal 2 (Week 2): Patient will ambulate >86ft with LRAD and MinA PT Short Term Goal 3 (Week 2): Patient will initiate stair training  Skilled Therapeutic Interventions/Progress Updates:     Pt received supine in bed, agreeable to PT tx but she reports that she's waiting on nursing to provide I/O cath as her recent bladder scan showed >322mls. Reached out out RN who confirmed, Special educational needs teacher. Spent time providing education while waiting on nursing, education on home safety, DC planning, POC, etc. Pt's family took pictures of home, as requested yesterday. Reviewed these with patient as she as very large walk-in shower for master bath and tub-shower for guest bath. Husband has installed vertical grab bars within [employer] educational needs teacher. Will relay to primary OT.   Supine<>sit completed with supervision with HOB slightly elevated. Donned shoes and socks at [place] level for time. Squat<>pivot transfer completed at [facility] level to w/c with VC for setup and sequencing. Transported to 5N tower bridge to focus remainder of session on gait training. Completes sit<>stand to RW with CGA and requires maxA for placing LUE into RW orthotic. Ambulated variable distances, ~132ft + ~116ft + ~105ft with minA (fading to modA after ~46ft) and RW on level tile. Pt with improved ability to keep adequate wide BOS to reduce L truncal lean. She's also demonstrating improved LLE swing pattern but limited knee flexion resulting in some toe drag with fatigue. She may benefit from a foot-up brace and will trial this at later date.  Pt returned to her room in w/c for energy conservation.  Remained seated in w/c with safety belt alarm on, LUE supported in trough, setup assist for breakfast. Call bell in reach, all needs met.    Therapy Documentation Precautions:  Precautions Precautions: Fall Precaution Comments: L hemipareisis, L neglect/ inattention Restrictions Weight Bearing Restrictions: No Other Position/Activity Restrictions: shoulder sling for comfort with mobility -- only if it doesn't bother her R cervical spine General:     Therapy/Group: Individual Therapy  Carrera Kiesel P Jantz Main 03/15/2021, 7:30 AM

## 2021-03-16 LAB — BASIC METABOLIC PANEL
Anion gap: 9 (ref 5–15)
BUN: 20 mg/dL (ref 8–23)
CO2: 25 mmol/L (ref 22–32)
Calcium: 9 mg/dL (ref 8.9–10.3)
Chloride: 104 mmol/L (ref 98–111)
Creatinine, Ser: 1.02 mg/dL — ABNORMAL HIGH (ref 0.44–1.00)
GFR, Estimated: 56 mL/min — ABNORMAL LOW (ref >60)
Glucose, Bld: 93 mg/dL (ref 70–99)
Potassium: 3.9 mmol/L (ref 3.5–5.1)
Sodium: 138 mmol/L (ref 135–145)

## 2021-03-16 NOTE — Progress Notes (Addendum)
Physical Therapy Session Note  Patient Details  Name: Teresa [name] MRN: [id number] Date of Birth: [birth year]  Today's Date: 03/16/2021 PT Individual Time: 0800-0900 PT Individual Time Calculation (min): 60 min   Short Term Goals: Week 2:  PT Short Term Goal 1 (Week 2): Patient will complete transfers with LRAD and MinA consistently PT Short Term Goal 2 (Week 2): Patient will ambulate >87ft with LRAD and MinA PT Short Term Goal 3 (Week 2): Patient will initiate stair training Week 3:  PT Short Term Goal 1 (Week 3): STG = LTG due to ELOS  Skilled Therapeutic Interventions/Progress Updates:  Pt resting in bed.  She stated she had abdominal pain, unrated, due to bladder fullness.  PT informed NSG.  neuromuscular re-education via multimodal cues and demo for supine: bil hip adductor squeezes, bil hip adductor squeezes with bil bridging, L straight leg raises with ankle DF.  Seated- bil scapular retraction.  In nearly flat bed, rolling L and sitting up with rail, min assist.  Pt scooted forward on bed with supervision.  Stand pivot transfer to L with min assist.  Toilet transfer with min assist, min assist for doffing clothing.  Pt continent of bowels and voided a couple of drops, pt pt. Peri care by pt by leaning L, CGA for balance.  Check for cleanliness by PT.    Sit> stand with CGA.  Gait training with ACE LLE for foot drop, L hand orthosis on RW, on level tile x 80", including turns, CG/ min assist. Cues for upright posture, forward gaze, keeping feet within RW.  At end of session, pt seated in wc with needs at hand and seat belt alarm set.      Therapy Documentation Precautions:  Precautions Precautions: Fall Precaution Comments: L hemipareisis, L neglect/ inattention Restrictions Weight Bearing Restrictions: No Other Position/Activity Restrictions: shoulder sling for comfort with mobility -- only if it doesn't bother her R cervical spine     Therapy/Group: Individual  Therapy  Tracy Gerken 03/16/2021, 10:26 AM

## 2021-03-16 NOTE — Progress Notes (Signed)
Physical Therapy Weekly Progress Note  Patient Details  Name: Teresa [name] MRN: [id number] Date of Birth: 1940/07/07  Beginning of progress report period: March 09, 2021 End of progress report period: March 16, 2021  Today's Date: 03/16/2021 PT Individual Time: 5456-2563 PT Individual Time Calculation (min): 58 min   Patient has met 3 of 3 short term goals. Pt is making appropriate progress towards goals and is on track to meet LTG. Overall, she requires supervision for bed mobility (with hosiptal bed features), minA for sit<>stands to RW, minA for stand<>pivot and squat<>pivot transfers to w/c, minA for gait ~165ft with RW, and requires min/modA for navigating 4 stairs using 1 rail. At times, she can be self limiting and her acute on chronic L neck pain has been a barrier at times. Family training has been initiated and is ongoing, with formal scheduling for next week prior to DC. Continue PT POC.  Patient continues to demonstrate the following deficits muscle weakness, decreased cardiorespiratoy endurance, motor apraxia, decreased attention to left and decreased motor planning, decreased attention, decreased awareness, decreased problem solving, and decreased safety awareness, and decreased standing balance, decreased postural control, hemiplegia, and decreased balance strategies and therefore will continue to benefit from skilled PT intervention to increase functional independence with mobility.  Patient progressing toward long term goals..  Continue plan of care.  PT Short Term Goals Week 2:  PT Short Term Goal 1 (Week 2): Patient will complete transfers with LRAD and MinA consistently PT Short Term Goal 2 (Week 2): Patient will ambulate >27ft with LRAD and MinA PT Short Term Goal 3 (Week 2): Patient will initiate stair training Week 3:  PT Short Term Goal 1 (Week 3): STG = LTG due to ELOS  Skilled Therapeutic Interventions/Progress Updates:     Pt supine in bed to start -  agreeable to PT tx without reports of pain. Reports fatigue from busy day of therapies. Supine<>sit at supervision level. Donned jacket with modA with poor recall of hemi strategies that were reviewed in detail yesterday with OT. Stand<>pivot transfer with minA from EOB to w/c with assist for lateral weight shifting and mod cues for stepping placement with weaker LLE. Transported downstairs to day room rehab gym. Completed various dynamic standing activities with holiday party - corn hole toss and jingle bell pong - minA for steadying during these and incorporated cognitive task for counting and tallying scores where she required minA for accuracy. Pt then completed functional gait training where she ambulated 2x162ft with RW - required minA for first ~40ft and then modA for remaining distance due to fatigue. Primary gait deficits include adduction of L, foot catch on L, trunk lean L, and assist needed for RW management. Provided foot-up brace for 2nd gait trial with minimal improvements. Pt returned upstairs to her room where she remained seated in w/c with husband at bedside, chair alarm on, all needs in reach.   Therapy Documentation Precautions:  Precautions Precautions: Fall Precaution Comments: L hemipareisis, L neglect/ inattention Restrictions Weight Bearing Restrictions: No Other Position/Activity Restrictions: shoulder sling for comfort with mobility -- only if it doesn't bother her R cervical spine General:    Therapy/Group: Individual Therapy  Kalyani Maeda P Rico Massar PT 03/16/2021, 7:32 AM

## 2021-03-16 NOTE — Progress Notes (Signed)
PROGRESS NOTE   Subjective/Complaints: Pt feels bloated, bladder full. Doesn't like being cathed  ROS: Patient denies fever, rash, sore throat, blurred vision, nausea, vomiting, diarrhea, cough, shortness of breath or chest pain, joint or back pain, headache, or mood change.    Objective:   No results found. Recent Labs    03/14/21 0526  WBC 6.9  HGB 11.5*  HCT 33.8*  PLT 243    Recent Labs    03/14/21 0526 03/15/21 0526 03/16/21 0508  NA 137  --  138  K 4.1  --  3.9  CL 102  --  104  CO2 27  --  25  GLUCOSE 99  --  93  BUN 27*  --  20  CREATININE 1.13* 1.13* 1.02*  CALCIUM 9.3  --  9.0     Intake/Output Summary (Last 24 hours) at 03/16/2021 1102 Last data filed at 03/16/2021 0528 Gross per 24 hour  Intake 1660.05 ml  Output 2600 ml  Net -939.95 ml        Physical Exam: Vital Signs Blood pressure 121/71, pulse (!) 59, temperature 98 F (36.7 C), temperature source Oral, resp. rate 16, height 5\' 2"  (1.575 m), weight 68.2 kg, SpO2 97 %.    Constitutional: No distress . Vital signs reviewed. HEENT: NCAT, EOMI, oral membranes moist Neck: supple Cardiovascular: RRR without murmur. No JVD    Respiratory/Chest: CTA Bilaterally without wheezes or rales. Normal effort    GI/Abdomen: BS +, non-tender, non-distended Ext: no clubbing, cyanosis, or edema Psych: pleasant and cooperative  Skin: No evidence of breakdown, no evidence of rash Musculoskeletal:     Cervical back: Normal range of motion. No rigidity.      RUE/RLE 5/5 Skin:    Comments: Buttocks no wounds 2 spots on back of thighs- scratch and birth mark Hands a little cool but no obvious vasomotor signs Neurological:     Comments: Patient is alert.  Moderate dysarthria.  Left noted facial droop.  She is able to name objects.  Follows simple commands Sensation reduced to LT in LUE RUE/RLE 5/5 LUE- 1-2/5 grip and finger abd, 2/5 EF and EE LLE  3/5 HF.KE.DF and PF- improving!  Dysarthria improving     Assessment/Plan: 1. Functional deficits which require 3+ hours per day of interdisciplinary therapy in a comprehensive inpatient rehab setting. Physiatrist is providing close team supervision and 24 hour management of active medical problems listed below. Physiatrist and rehab team continue to assess barriers to discharge/monitor patient progress toward functional and medical goals  Care Tool:  Bathing    Body parts bathed by patient: Face, Abdomen   Body parts bathed by helper: Right arm, Left arm, Chest, Front perineal area, Buttocks, Right upper leg, Left upper leg, Right lower leg, Left lower leg     Bathing assist Assist Level: Minimal Assistance - Patient > 75%     Upper Body Dressing/Undressing Upper body dressing   What is the patient wearing?: Pull over shirt    Upper body assist Assist Level: Moderate Assistance - Patient 50 - 74%    Lower Body Dressing/Undressing Lower body dressing      What is the patient wearing?:  Pants, Incontinence brief     Lower body assist Assist for lower body dressing: Moderate Assistance - Patient 50 - 74%     Toileting Toileting    Toileting assist Assist for toileting: Moderate Assistance - Patient 50 - 74%     Transfers Chair/bed transfer  Transfers assist     Chair/bed transfer assist level: Minimal Assistance - Patient > 75%     Locomotion Ambulation   Ambulation assist   Ambulation activity did not occur: Safety/medical concerns  Assist level: Minimal Assistance - Patient > 75% Assistive device: Walker-rolling (L hand orthosis) Max distance: 80   Walk 10 feet activity   Assist  Walk 10 feet activity did not occur: Safety/medical concerns    Assistive device: Walker-rolling   Walk 50 feet activity   Assist Walk 50 feet with 2 turns activity did not occur: Safety/medical concerns  Assist level: Minimal Assistance - Patient >  75% Assistive device: Walker-rolling    Walk 150 feet activity   Assist Walk 150 feet activity did not occur: Safety/medical concerns         Walk 10 feet on uneven surface  activity   Assist Walk 10 feet on uneven surfaces activity did not occur: Safety/medical concerns         Wheelchair     Assist Is the patient using a wheelchair?: Yes Type of Wheelchair: Manual    Wheelchair assist level: Total Assistance - Patient < 25%, Dependent - Patient 0% Max wheelchair distance: 300 ft    Wheelchair 50 feet with 2 turns activity    Assist        Assist Level: Total Assistance - Patient < 25%   Wheelchair 150 feet activity     Assist      Assist Level: Total Assistance - Patient < 25%, Dependent - Patient 0%   Blood pressure 121/71, pulse (!) 59, temperature 98 F (36.7 C), temperature source Oral, resp. rate 16, height 5\' 2"  (1.575 m), weight 68.2 kg, SpO2 97 %.  Medical Problem List and Plan: 1. 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  2. Impaired mobility: continue Lovenox             -antiplatelet therapy: Aspirin 81 mg daily and Plavix 75 mg day x3 weeks then Plavix alone, d/c ASA on 12/26 3. Neck pain: kpad added. continue tylenol prn. Continue voltaren gel and flexeril 5mg  TID PRN. 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   12/23 meds seem to be working for her  -havef discussed importance of shoulder support, proper posture, and good mechanics. She can't try to force things to happen 4. Mood: Provide emotional support             -antipsychotic agents: N/A 5. Neuropsych: This patient is capable of making decisions on his own behalf. 6. Skin/Wound Care: Routine skin checks 7. Fluids/Electrolytes/Nutrition: Routine in and outs with follow-up chemistries 8.  Dysphagia.  Dysphagia #1 nectar liquids.   Follow-up speech therapy- might need IVFs at night since on nectar thick liquids   12/24 BUN better, will stop IVF, continue to push po, spoke with pt 9.  Hypertension.  Decrease Cozaar to 25mg  daily-may need to titrate further given restarting of home nisoldipine which helps with  her Raynaud's. Monitor with increased mobility. Continue tizanidine at night which will also help her sleep. Husband brought in nisoldipine34 mg that patient takes at home-placed order that she may use own med starting tomorrow  12/23 controled Vitals:   03/15/21 2006 03/16/21 0415  BP: 113/62 121/71  Pulse: 72 (!) 59  Resp: 18 16  Temp: 98.4 F (36.9 C) 98 F (36.7 C)  SpO2: 98% 97%    10.  Hyperlipidemia.  Crestor 11.  Hypothyroidism.  Synthroid 12.  Urinary retention.   Cannot use flomax due to sulfa allergy, -12/22 increased urecholine- 25mg  QID 12/21 - cont I/O cath as needed -oob to void -dc IVF which should help 13.  AKI.  Baseline creatinine 1.0-1.4. Follow-up chemistries- esp in light of nectar thick liquids. Placed nursing order to encourage 6-8 glasses of thickened water per day.   See above 14.  Obesity.  BMI 26.52.  Provided dietary education 15. Raynaud's syndrome: Husband brought in nisoldipine34 mg which she's now taking  -discussed her other blood thinners and that they wouldn't make her sx worse  -Complaints of Left arm coldness is likely due to CVA sensory deficits as well as reduced muscular activity no appreciable temp difference noted left to RIght hand --stable in appearance 16. Cognitive deficits:higher level cont SLP.      LOS: 15 days A FACE TO FACE EVALUATION WAS PERFORMED  Meredith Staggers 03/16/2021, 11:02 AM

## 2021-03-16 NOTE — Progress Notes (Signed)
Pt had 2 small episodes of urine output but post void residual remains 323ml. In and out cath performed X2 this shift.   Pt is also on nightly iv fluids but has been taking large volume of thin liquid orally since diet has been progressed to thin liquids.   Will asked day shift nurse to bring this up in the morning round to re-evaluate the need.

## 2021-03-16 NOTE — Progress Notes (Signed)
Patient was able to void large amount of urine. NT did a bladder scan right after and read 0 mL.

## 2021-03-16 NOTE — Progress Notes (Signed)
Occupational Therapy Session Note  Patient Details  Name: Teresa [name] MRN: [id number] Date of Birth: 1940-07-06  Today's Date: 03/16/2021 OT Individual Time: 0930-1015 OT Individual Time Calculation (min): 45 min    Short Term Goals: Week 2:  OT Short Term Goal 1 (Week 2): Pt will complete donn/doff pants with min assist and mod multimodal cues. OT Short Term Goal 2 (Week 2): Pt will exhibit dynamic standing balance with CGA at RW for standing self care tasks. OT Short Term Goal 3 (Week 2): Pt will be able to actively rotate head to left approximatly 60 degrees or more to attend to left side. OT Short Term Goal 4 (Week 2): Pt will donn/doff shirt overhead with min assist and mod multimodal cues.  Skilled Therapeutic Interventions/Progress Updates:    Pt received in wc ready for the day. Agreeable to working on LUE.  Pt taken to treatment room to be able to position her at a table level with her elbow height.  In discussing her ongoing L shoulder pain, I told her I felt the pain was actually coming from her upper trap and showed her some neck stretches with self ROM. Pt did repeat demonstrate stretches but will benefit from more practice.   With forearm resting on table on towel, she worked on a/arom of slow towel slides forward and back with a focus on scapular retraction.  Large slow arm circles with gentle activation of scapular muscles.  Then focused on biceps/triceps with A/arom with arm resting on table. Pt has good elicitation of biceps with 20-25% in triceps.  Hand over hand guiding of L hand for grasp and release with forearm pronation for grasp and release of cones.  Then was able to facilitate pt at her wrist for flex and extension to allow her to elicit her finger flexors.  (Pt stated yesterday she could squeeze the foam block but not today). She was able to put some light pressure on the cones.   She participated well, pt returned to room with all needs met. NT working in room  and will reapply seat alarm.   Therapy Documentation Precautions:  Precautions Precautions: Fall Precaution Comments: L hemipareisis, L neglect/ inattention Restrictions Weight Bearing Restrictions: No Other Position/Activity Restrictions: shoulder sling for comfort with mobility -- only if it doesn't bother her R cervical spine  Pain: Pain Assessment Pain Scale: 0-10 Pain Score: 0-No pain at rest    Therapy/Group: Individual Therapy  Adonia Porada 03/16/2021, 11:09 AM

## 2021-03-16 NOTE — Progress Notes (Signed)
Speech Language Pathology Daily Session Note  Patient Details  Name: Teresa [name] MRN: [id number] Date of Birth: Aug 16, 1940  Today's Date: 03/16/2021 SLP Individual Time: 9774-1423 SLP Individual Time Calculation (min): 41 min  Short Term Goals: Week 2: SLP Short Term Goal 1 (Week 2): Patient will tolerate current diet (Dys 3, thin liquids) without overt s/s aspiration or penetration with minA cues for awareness and use of strategies. SLP Short Term Goal 2 (Week 2): Patient will recall and return demonstrate safety strategies and exercises learned in PT/OT/ST sessions, with Supervision A verbal/visual cues. SLP Short Term Goal 3 (Week 2): Pt will complete medication/money management tasks with 90% accuracy provided min A verbal and visual cues SLP Short Term Goal 4 (Week 2): Pt will solve moderately complex problems related to current and discharge environment with adequate awareness of physical/cognitive changes s/p CVA with min A cues  Skilled Therapeutic Interventions: Skilled ST treatment focused on cognitive goals. SLP facilitated session by providing supervision assist verbal cues for comprehension of medication labels and problem solving with simulated medication management safety scenarios. Pt required consistent clarification for flexible thinking with hypothetical situations, taking one tablet vs. two, and once daily vs. twice daily with decreased carry over of this concept throughout task. Pt stated her spouse would be able to provide support with medication management at discharge as needed. Patient was passed off to nursing staff at end of session. Continue per current plan of care.      Pain Pain Assessment Pain Scale: 0-10 Pain Score: 0-No pain  Therapy/Group: Individual Therapy  Teresa [name] T Nhan Qualley 03/16/2021, 11:08 AM

## 2021-03-17 MED ORDER — PANTOPRAZOLE SODIUM 40 MG PO TBEC
40.0000 mg | DELAYED_RELEASE_TABLET | Freq: Every day | ORAL | Status: DC
  Administered 2021-03-17 – 2021-03-23 (×7): 40 mg via ORAL
  Filled 2021-03-17 (×7): qty 1

## 2021-03-17 NOTE — Progress Notes (Signed)
Physical Therapy Session Note  Patient Details  Name: Teresa [name] MRN: [id number] Date of Birth: Apr 10, 1940  Today's Date: 03/17/2021 PT Individual Time: 1230-1326 PT Individual Time Calculation (min): 56 min   Short Term Goals: Week 3:  PT Short Term Goal 1 (Week 3): STG = LTG due to ELOS  Skilled Therapeutic Interventions/Progress Updates:     Pt in [place] with NT assisting to bathroom. NT assisted pt on toilet and then direct handoff of care to PT as pt agreeable to therapy session. Pt reports 6/10 pain in her L neck/shoulder. Used her home TENS unit with preset settings to manage pain during session and this was removed at conclusion of session. She was continent of bladder on toilet but had difficulty going despite the urge - required sound of water to assist. Used [place] for time to transfer her back to her w/c - 0verall minA. Transported patient downstairs in w/c to 67M rehab gym for time. Instructed in curb transfers where she completed x4 curb transfers with RW and minA - required assist for stabilizing and lifting RW to curb and minA for balance as well as she steps up. Retrieved AFO (Walk-on trimmable) for her L foot. She has 3-/5 ankle DF strength but sometimes lacks awareness for L foot due to proprioceptive deficits. She ambulated 18ft + 167ft with minA and RW while using AFO - much improved foot clearance and ability to keep adequate wide BOS. Reached out to scheduleling to schedule AFO consult on Tuesday, 12/27. She demonstrates L truncal lean with fatigue, after ~22ft. She also has difficulty completing 180deg turns to safely sit to surfaces where she requires more of a modA with max verbal cueing. Pt returned upstairs to her room where she remained seated in w/c with 1/2 lap tray supporting LUE, all needs within reach.   Therapy Documentation Precautions:  Precautions Precautions: Fall Precaution Comments: L hemipareisis, L neglect/ inattention Restrictions Weight Bearing  Restrictions: No Other Position/Activity Restrictions: shoulder sling for comfort with mobility -- only if it doesn't bother her R cervical spine General:    Therapy/Group: Individual Therapy  Liany Mumpower P Rodneshia Greenhouse 03/17/2021, 1:25 PM

## 2021-03-17 NOTE — Progress Notes (Signed)
PROGRESS NOTE   Subjective/Complaints: Overall progressing. Notes that her visual acuity has changed since the stroke. Asked if she should see an eye doctor when she goes home.   ROS: Patient denies fever, rash, sore throat,  nausea, vomiting, diarrhea, cough, shortness of breath or chest pain, joint or back pain, headache, or mood change.    Objective:   No results found. No results for input(s): WBC, HGB, HCT, PLT in the last 72 hours.   Recent Labs    03/15/21 0526 03/16/21 0508  NA  --  138  K  --  3.9  CL  --  104  CO2  --  25  GLUCOSE  --  93  BUN  --  20  CREATININE 1.13* 1.02*  CALCIUM  --  9.0     Intake/Output Summary (Last 24 hours) at 03/17/2021 1218 Last data filed at 03/17/2021 1118 Gross per 24 hour  Intake 1053 ml  Output 327 ml  Net 726 ml        Physical Exam: Vital Signs Blood pressure 122/66, pulse (!) 57, temperature 97.6 F (36.4 C), temperature source Oral, resp. rate 18, height 5\' 2"  (1.575 m), weight 68.2 kg, SpO2 98 %.    Constitutional: No distress . Vital signs reviewed. HEENT: NCAT, EOMI, oral membranes moist Neck: supple Cardiovascular: RRR without murmur. No JVD    Respiratory/Chest: CTA Bilaterally without wheezes or rales. Normal effort    GI/Abdomen: BS +, non-tender, non-distended Ext: no clubbing, cyanosis, or edema Psych: pleasant and cooperative   Skin: No evidence of breakdown, no evidence of rash Musculoskeletal:     Cervical back: Normal range of motion. No rigidity.      RUE/RLE 5/5 Skin:    Comments: Buttocks no wounds 2 spots on back of thighs- scratch and birth mark Hands a little cool but no obvious vasomotor signs Neurological:     Comments: Patient is alert.  speech clear.  Left C7.  reasonable insight and awareness. Sensation reduced to LT in LUE RUE/RLE 5/5 LUE- 1-2/5 grip and finger abd, 2/5 EF and EE LLE 3/5 HF.KE.DF and PF- improved         Assessment/Plan: 1. Functional deficits which require 3+ hours per day of interdisciplinary therapy in a comprehensive inpatient rehab setting. Physiatrist is providing close team supervision and 24 hour management of active medical problems listed below. Physiatrist and rehab team continue to assess barriers to discharge/monitor patient progress toward functional and medical goals  Care Tool:  Bathing    Body parts bathed by patient: Face, Abdomen   Body parts bathed by helper: Right arm, Left arm, Chest, Front perineal area, Buttocks, Right upper leg, Left upper leg, Right lower leg, Left lower leg     Bathing assist Assist Level: Minimal Assistance - Patient > 75%     Upper Body Dressing/Undressing Upper body dressing   What is the patient wearing?: Pull over shirt    Upper body assist Assist Level: Moderate Assistance - Patient 50 - 74%    Lower Body Dressing/Undressing Lower body dressing      What is the patient wearing?: Pants, Incontinence brief     Lower body  assist Assist for lower body dressing: Moderate Assistance - Patient 50 - 74%     Toileting Toileting    Toileting assist Assist for toileting: Moderate Assistance - Patient 50 - 74%     Transfers Chair/bed transfer  Transfers assist     Chair/bed transfer assist level: Minimal Assistance - Patient > 75%     Locomotion Ambulation   Ambulation assist   Ambulation activity did not occur: Safety/medical concerns  Assist level: Minimal Assistance - Patient > 75% Assistive device: Walker-rolling (L hand orthosis) Max distance: 80   Walk 10 feet activity   Assist  Walk 10 feet activity did not occur: Safety/medical concerns    Assistive device: Walker-rolling   Walk 50 feet activity   Assist Walk 50 feet with 2 turns activity did not occur: Safety/medical concerns  Assist level: Minimal Assistance - Patient > 75% Assistive device: Walker-rolling    Walk 150 feet  activity   Assist Walk 150 feet activity did not occur: Safety/medical concerns         Walk 10 feet on uneven surface  activity   Assist Walk 10 feet on uneven surfaces activity did not occur: Safety/medical concerns         Wheelchair     Assist Is the patient using a wheelchair?: Yes Type of Wheelchair: Manual    Wheelchair assist level: Total Assistance - Patient < 25%, Dependent - Patient 0% Max wheelchair distance: 300 ft    Wheelchair 50 feet with 2 turns activity    Assist        Assist Level: Total Assistance - Patient < 25%   Wheelchair 150 feet activity     Assist      Assist Level: Total Assistance - Patient < 25%, Dependent - Patient 0%   Blood pressure 122/66, pulse (!) 57, temperature 97.6 F (36.4 C), temperature source Oral, resp. rate 18, height 5\' 2"  (1.575 m), weight 68.2 kg, SpO2 98 %.  Medical Problem List and Plan: 1. 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 Impaired mobility: continue Lovenox             -antiplatelet therapy: Aspirin 81 mg daily and Plavix 75 mg day x3 weeks then Plavix alone, d/c ASA on 12/26 3. Neck pain: kpad added. continue tylenol prn. Continue voltaren gel and flexeril 5mg  TID PRN. 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   12/24 meds seem to be working for her  -maintain posture, left shoulder support 4. Mood: Provide emotional support             -antipsychotic agents: N/A 5. Neuropsych: This patient is capable of making decisions on his own behalf. 6. Skin/Wound Care: Routine skin checks 7. Fluids/Electrolytes/Nutrition: Routine in and outs with follow-up chemistries 8.  Dysphagia.  Dysphagia #1 nectar liquids.  Follow-up speech therapy- might need IVFs at night since on nectar thick liquids   12/24 BUN better, have stopped IVF, continue to push po    -check labs again monday 9.  Hypertension.  Decrease Cozaar to 25mg  daily-may need to titrate further given restarting of home nisoldipine which helps with her Raynaud's. Monitor with increased mobility. Continue tizanidine at night which will also help her sleep. Husband brought in nisoldipine34  mg that patient takes at home-placed order that she may use own med starting tomorrow  12/24 controled Vitals:   03/16/21 2006 03/17/21 0358  BP: 107/70 122/66  Pulse: 66 (!) 57  Resp: 18 18  Temp: 98.1 F (36.7 C) 97.6 F (36.4 C)  SpO2: 98% 98%    10.  Hyperlipidemia.  Crestor 11.  Hypothyroidism.  Synthroid 12.  Urinary retention.   Cannot use flomax due to sulfa allergy, -12/24 beginning to void, no caths in last 24+ hours -continue urecholine- 25mg  QID  -  I/O cath as needed -oob to void   13.  AKI.  Baseline creatinine 1.0-1.4. Follow-up chemistries- esp in light of nectar thick liquids. Placed nursing order to encourage 6-8 glasses of thickened water per day.   See above 14.  Obesity.  BMI 26.52.  Provided dietary education 15. Raynaud's syndrome: Husband brought in nisoldipine34 mg which she's now taking  -have discussed that her  blood thinners wouldn't make her sx worse  -Complaints of Left arm coldness is likely due to CVA sensory deficits as well as reduced muscular activity no appreciable temp difference noted left to RIght hand --stable in appearance 16. Cognitive deficits:higher level cont SLP.      LOS: 16 days A FACE TO FACE EVALUATION WAS PERFORMED  Meredith Staggers 03/17/2021, 12:18 PM

## 2021-03-17 NOTE — Progress Notes (Signed)
Speech Language Pathology Daily Session Note  Patient Details  Name: Teresa [name] MRN: [id number] Date of Birth: 04/15/1940  Today's Date: 03/17/2021 SLP Individual Time: 1030-1100 SLP Individual Time Calculation (min): 30 min  Short Term Goals: Week 2: SLP Short Term Goal 1 (Week 2): Patient will tolerate current diet (Dys 3, thin liquids) without overt s/s aspiration or penetration with minA cues for awareness and use of strategies. SLP Short Term Goal 2 (Week 2): Patient will recall and return demonstrate safety strategies and exercises learned in PT/OT/ST sessions, with Supervision A verbal/visual cues. SLP Short Term Goal 3 (Week 2): Pt will complete medication/money management tasks with 90% accuracy provided min A verbal and visual cues SLP Short Term Goal 4 (Week 2): Pt will solve moderately complex problems related to current and discharge environment with adequate awareness of physical/cognitive changes s/p CVA with min A cues  Skilled Therapeutic Interventions: Skilled ST treatment focused on cognitive goals. Patient identified organization/administration errors in BID pillbox with up to 6 medications with modified independence and additional processing time to achieve 100% accuracy. Patient also generated appropriate solutions with logical reasoning and appropriate safety awareness. Patient sustained attention for duration of session without need for verbal redirection. Patient was left in bed with alarm activated and immediate needs within reach at end of session. Continue per current plan of care.      Pain Pain Assessment Pain Scale: 0-10 Pain Score: 0-No pain  Therapy/Group: Individual Therapy  Teresa [name] 03/17/2021, 10:55 AM

## 2021-03-19 LAB — BASIC METABOLIC PANEL
Anion gap: 12 (ref 5–15)
BUN: 18 mg/dL (ref 8–23)
CO2: 23 mmol/L (ref 22–32)
Calcium: 9.6 mg/dL (ref 8.9–10.3)
Chloride: 101 mmol/L (ref 98–111)
Creatinine, Ser: 1.05 mg/dL — ABNORMAL HIGH (ref 0.44–1.00)
GFR, Estimated: 54 mL/min — ABNORMAL LOW (ref >60)
Glucose, Bld: 102 mg/dL — ABNORMAL HIGH (ref 70–99)
Potassium: 4.1 mmol/L (ref 3.5–5.1)
Sodium: 136 mmol/L (ref 135–145)

## 2021-03-19 MED ORDER — BETHANECHOL CHLORIDE 25 MG PO TABS
25.0000 mg | ORAL_TABLET | Freq: Three times a day (TID) | ORAL | Status: DC
  Administered 2021-03-19 – 2021-03-23 (×12): 25 mg via ORAL
  Filled 2021-03-19 (×12): qty 1

## 2021-03-19 NOTE — Progress Notes (Signed)
PROGRESS NOTE   Subjective/Complaints: No new complaints this morning Urinating well now Still has some neck pain but has not been asking for PRN muscle relaxers during the day  ROS: Patient denies fever, rash, sore throat,  nausea, vomiting, diarrhea, cough, shortness of breath or chest pain, headache, or mood change. +neck pain   Objective:   No results found. No results for input(s): WBC, HGB, HCT, PLT in the last 72 hours.   Recent Labs    03/19/21 0811  NA 136  K 4.1  CL 101  CO2 23  GLUCOSE 102*  BUN 18  CREATININE 1.05*  CALCIUM 9.6     Intake/Output Summary (Last 24 hours) at 03/19/2021 1137 Last data filed at 03/19/2021 0800 Gross per 24 hour  Intake 517 ml  Output --  Net 517 ml        Physical Exam: Vital Signs Blood pressure (!) 144/77, pulse 66, temperature 97.8 F (36.6 C), resp. rate 16, height 5\' 2"  (1.575 m), weight 68.2 kg, SpO2 100 %. Gen: no distress, normal appearing HEENT: oral mucosa pink and moist, NCAT Cardio: Reg rate Chest: normal effort, normal rate of breathing Abd: soft, non-distended Ext: no edema Psych: pleasant, normal affect Musculoskeletal:     Cervical back: Normal range of motion. No rigidity.      RUE/RLE 5/5 Skin:    Comments: Buttocks no wounds 2 spots on back of thighs- scratch and birth mark Hands a little cool but no obvious vasomotor signs Neurological:     Comments: Patient is alert.  speech clear.  Left C7.  reasonable insight and awareness. Sensation reduced to LT in LUE RUE/RLE 5/5 LUE- 1-2/5 grip and finger abd, 2/5 EF and EE LLE 3/5 HF.KE.DF and PF- improved        Assessment/Plan: 1. Functional deficits which require 3+ hours per day of interdisciplinary therapy in a comprehensive inpatient rehab setting. Physiatrist is providing close team supervision and 24 hour management of active medical problems listed below. Physiatrist and rehab  team continue to assess barriers to discharge/monitor patient progress toward functional and medical goals  Care Tool:  Bathing    Body parts bathed by patient: Face, Abdomen   Body parts bathed by helper: Right arm, Left arm, Chest, Front perineal area, Buttocks, Right upper leg, Left upper leg, Right lower leg, Left lower leg     Bathing assist Assist Level: Minimal Assistance - Patient > 75%     Upper Body Dressing/Undressing Upper body dressing   What is the patient wearing?: Pull over shirt    Upper body assist Assist Level: Moderate Assistance - Patient 50 - 74%    Lower Body Dressing/Undressing Lower body dressing      What is the patient wearing?: Pants, Incontinence brief     Lower body assist Assist for lower body dressing: Moderate Assistance - Patient 50 - 74%     Toileting Toileting    Toileting assist Assist for toileting: Moderate Assistance - Patient 50 - 74%     Transfers Chair/bed transfer  Transfers assist     Chair/bed transfer assist level: Minimal Assistance - Patient > 75%     Locomotion Ambulation  Ambulation assist   Ambulation activity did not occur: Safety/medical concerns  Assist level: Minimal Assistance - Patient > 75% Assistive device: Walker-rolling (L hand orthosis) Max distance: 80   Walk 10 feet activity   Assist  Walk 10 feet activity did not occur: Safety/medical concerns    Assistive device: Walker-rolling   Walk 50 feet activity   Assist Walk 50 feet with 2 turns activity did not occur: Safety/medical concerns  Assist level: Minimal Assistance - Patient > 75% Assistive device: Walker-rolling    Walk 150 feet activity   Assist Walk 150 feet activity did not occur: Safety/medical concerns         Walk 10 feet on uneven surface  activity   Assist Walk 10 feet on uneven surfaces activity did not occur: Safety/medical concerns         Wheelchair     Assist Is the patient using a  wheelchair?: Yes Type of Wheelchair: Manual    Wheelchair assist level: Total Assistance - Patient < 25%, Dependent - Patient 0% Max wheelchair distance: 300 ft    Wheelchair 50 feet with 2 turns activity    Assist        Assist Level: Total Assistance - Patient < 25%   Wheelchair 150 feet activity     Assist      Assist Level: Total Assistance - Patient < 25%, Dependent - Patient 0%   Blood pressure (!) 144/77, pulse 66, temperature 97.8 F (36.6 C), resp. rate 16, height 5\' 2"  (1.575 m), weight 68.2 kg, SpO2 100 %.  Medical Problem List and Plan: 1. 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 Ranell Patrick on 04/10/21 2. Impaired mobility: discontinue Lovenox since ambulating >300 feet             -antiplatelet therapy: Aspirin 81 mg daily and Plavix 75 mg day x3 weeks then Plavix alone, d/c ASA on 12/26 3. Neck pain: kpad added-encouraged use. continue tylenol prn. Continue voltaren gel and flexeril 5mg  TID PRN. 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   -maintain posture, left shoulder support 4. Mood: Provide emotional support             -antipsychotic agents: N/A 5. Neuropsych: This patient is capable of making decisions on his own behalf. 6. Skin/Wound Care: Routine skin checks 7. Fluids/Electrolytes/Nutrition: Routine in and outs with follow-up chemistries 8.  Dysphagia.  Dysphagia #1 nectar liquids.  Follow-up speech therapy- might need IVFs at night since on nectar thick liquids   12/24 BUN better, have stopped IVF, continue to push po   -check labs again monday 9.  Hypertension.  Decrease Cozaar to 25mg  daily-may need to titrate further given restarting of home nisoldipine which helps with her Raynaud's. Monitor with increased mobility. Continue tizanidine at night which will also help her sleep.  Husband brought in nisoldipine34 mg that patient takes at home-placed order that she may use own med starting tomorrow  12/24 controled Vitals:   03/18/21 1939 03/19/21 0518  BP: 126/62 (!) 144/77  Pulse: 64 66  Resp: 16 16  Temp: 98.4 F (36.9 C) 97.8 F (36.6 C)  SpO2: 100% 100%    10.  Hyperlipidemia.  Crestor 11.  Hypothyroidism.  Synthroid 12.  Urinary retention.   Cannot use flomax due to sulfa allergy, Wean Urecholine to 25mg  TID -  I/O cath as needed -oob to void   13.  AKI.  Baseline creatinine 1.0-1.4. Follow-up chemistries- esp in light of nectar thick liquids. Placed nursing order to encourage 6-8 glasses of thickened water per day.   See above 14.  Obesity.  BMI 26.52.  Provided dietary education 15. Raynaud's syndrome: Husband brought in nisoldipine34 mg which she's now taking  -have discussed that her  blood thinners wouldn't make her sx worse  -Complaints of Left arm coldness is likely due to CVA sensory deficits as well as reduced muscular activity no appreciable temp difference noted left to RIght hand --stable in appearance 16. Cognitive deficits:higher level cont SLP.      LOS: 18 days A FACE TO FACE EVALUATION WAS PERFORMED  Clide Deutscher Milad Bublitz 03/19/2021, 11:37 AM

## 2021-03-19 NOTE — Progress Notes (Signed)
Physical Therapy Session Note  Patient Details  Name: Teresa [name] MRN: [id number] Date of Birth: [birth year]  Today's Date: 03/19/2021 PT Individual Time: 3662-9476 PT Individual Time Calculation (min): 42 min   Short Term Goals:  Week 3:  PT Short Term Goal 1 (Week 3): STG = LTG due to ELOS  Skilled Therapeutic Interventions/Progress Updates:    Patient received sitting edge of bed, eating breakfast, agreeable to PT. She reports pain in L shoulder only when it's unsupported. PT providing full supervision while patient finished her breakfast. Appropriate static sitting balance noted at edge of bed with B LE supported on ground. PT donning shoes + AFO TotalA for time. CGA stand pivot to wc. Patient completing x8 mins on NuStep B LE, RUE. Patient ambulating ~142ft back to her room with RW and MinA. Consistent verbal cues needed to increase L foot clearance and stride length. Patient, at times, with in incomplete L step and premature weight shift L resulting in sudden L knee hyperextension. Patient remaining up in wc, seatbelt alarm on, call light within reach.   Therapy Documentation Precautions:  Precautions Precautions: Fall Precaution Comments: L hemipareisis, L neglect/ inattention Restrictions Weight Bearing Restrictions: No Other Position/Activity Restrictions: shoulder sling for comfort with mobility -- only if it doesn't bother her R cervical spine     Therapy/Group: Individual Therapy  Karoline Caldwell, PT, DPT, CBIS  03/19/2021, 7:44 AM

## 2021-03-19 NOTE — Progress Notes (Signed)
Occupational Therapy Session Note  Patient Details  Name: Teresa [name] MRN: [id number] Date of Birth: 1940-07-27  Today's Date: 03/19/2021 OT Individual Time: 1062-6948 OT Individual Time Calculation (min): 41 min    Short Term Goals: Week 2:  OT Short Term Goal 1 (Week 2): Pt will complete donn/doff pants with min assist and mod multimodal cues. OT Short Term Goal 2 (Week 2): Pt will exhibit dynamic standing balance with CGA at RW for standing self care tasks. OT Short Term Goal 3 (Week 2): Pt will be able to actively rotate head to left approximatly 60 degrees or more to attend to left side. OT Short Term Goal 4 (Week 2): Pt will donn/doff shirt overhead with min assist and mod multimodal cues.   Skilled Therapeutic Interventions/Progress Updates:    Pt sitting in her w/c with no c/o pain. Ambulatory transfer to the bathroom using RW with L hand splint with min A. Minor L LOB during turn to sit on the [facility], recovered with min A. She was able to complete all toileting tasks with min A overall, mod cueing. Pt returned to her w/c and was taken to the therapy gym. She completed stand pivot transfer to the mat with min A. She transitioned into supine for focus on LUE NMR. Worked on tricep extension and serratus anterior activation. Great activation overall with min-mod facilitation required. Pt transitioned to sidelying and completed bicep/tricep activation with focus on visual attention and motor control. Again, great activation and full  ROM but with poor motor control and wrist activation/support. Pt returned to her room and was left sitting up in the w/c with all needs met, family present.   Therapy Documentation Precautions:  Precautions Precautions: Fall Precaution Comments: L hemipareisis, L neglect/ inattention Restrictions Weight Bearing Restrictions: No Other Position/Activity Restrictions: shoulder sling for comfort with mobility -- only if it doesn't bother her R cervical  spine  Therapy/Group: Individual Therapy  Curtis Sites 03/19/2021, 12:36 PM

## 2021-03-19 NOTE — Progress Notes (Signed)
Physical Therapy Session Note  Patient Details  Name: Teresa [name] MRN: [id number] Date of Birth: [birth year]  Today's Date: 03/19/2021 PT Individual Time: 1696-7893 PT Individual Time Calculation (min): 40 min   Short Term Goals: Week 3:  PT Short Term Goal 1 (Week 3): STG = LTG due to ELOS  Skilled Therapeutic Interventions/Progress Updates:    Pt received supine in bed, agreeable to PT session. No complaints of pain. Supine to sit with Supervision. Assisted pt with donning shoes and L AFO while seated EOB. Sit to stand and stand pivot transfer to w/c with RW and CGA, cues for hand placement during transfer and some assist needed for LUE placement on L hand orthosis on RW. Ambulation x 200 ft, x 100 ft with RW with L hand orthosis, L AFO, and min A for balance. Pt requires cues for increased LLE hip and knee flexion as well as heel strike during gait. With onset of fatigue pt exhibits decreased LLE step length and becomes increasingly unsteady on her feet. Pt also requires max cueing for safety and sequencing when turning to sit back in w/c. Ascend/descend one 6" curb step with RW and min A for balance, cues for sequencing and safety as pt lifts up RW from the ground when turning towards step, needs cues to keep RW on the ground and only to lift RW when actually ascending/descending step. Pt returned to bed at end of session, Supervision for bed mobility. Pt left supine in bed with needs in reach at end of session.  Therapy Documentation Precautions:  Precautions Precautions: Fall Precaution Comments: L hemipareisis, L neglect/ inattention Restrictions Weight Bearing Restrictions: No Other Position/Activity Restrictions: shoulder sling for comfort with mobility -- only if it doesn't bother her R cervical spine     Therapy/Group: Individual Therapy   Excell Seltzer, PT, DPT, CSRS  03/19/2021, 5:08 PM

## 2021-03-19 NOTE — Progress Notes (Signed)
Speech Language Pathology Weekly Progress and Session Note  Patient Details  Name: Teresa [name] MRN: [id number] Date of Birth: 1941-01-01  Beginning of progress report period: March 12, 2021 End of progress report period: March 20, 2021  Today's Date: 03/20/2021 SLP Individual Time: 6812-7517 SLP Individual Time Calculation (min): 31 min  Short Term Goals: Week 2: SLP Short Term Goal 1 (Week 2): Patient will tolerate current diet (Dys 3, thin liquids) without overt s/s aspiration or penetration with minA cues for awareness and use of strategies. SLP Short Term Goal 1 - Progress (Week 2): Met SLP Short Term Goal 2 (Week 2): Patient will recall and return demonstrate safety strategies and exercises learned in PT/OT/ST sessions, with Supervision A verbal/visual cues. SLP Short Term Goal 2 - Progress (Week 2): Met SLP Short Term Goal 3 (Week 2): Pt will complete medication/money management tasks with 90% accuracy provided min A verbal and visual cues SLP Short Term Goal 3 - Progress (Week 2): Met SLP Short Term Goal 4 (Week 2): Pt will solve moderately complex problems related to current and discharge environment with adequate awareness of physical/cognitive changes s/p CVA with min A cues SLP Short Term Goal 4 - Progress (Week 2): Met    New Short Term Goals: Week 3: SLP Short Term Goal 1 (Week 3): STG=LTG due to ELOS  Weekly Progress Updates: Patient has made excellent gains and has met 4 of 4 STGs this reporting period. Currently, patient demonstrates improved attention, problem solving, and awareness requiring overall supervision A cues to complete functional and complex tasks accurately and safely. Pt also demonstrates improved oropharyngeal swallow function and diet was advanced to regular textures and thin liquids. Patient implements safe swallowing compensatory strategies with modified independence. Patient and family education is ongoing. Patient would benefit from  continued skilled SLP intervention to maximize cognitive functioning and overall functional independence prior to discharge. Pt is expected to continue to improve with cognition with ongoing skilled SLP intervention.     Intensity: Minumum of 1-2 x/day, 30 to 90 minutes Frequency: 3 to 5 out of 7 days Duration/Length of Stay: 03/23/21 Treatment/Interventions: Cognitive remediation/compensation;Dysphagia/aspiration precaution training;Environmental controls;Medication managment;Functional tasks;Patient/family education;Therapeutic Exercise;Speech/Language facilitation  Daily Session Skilled Therapeutic Interventions: Skilled ST treatment focused on swallowing and cognitive goals. Patient sitting upright in wheelchair on arrival and agreeable to regular texture PO trials. Patient consumed regular textures (cookie, crackers) with efficient mastication, trace oral residuals, and without overt s/s of aspiration. Patient independently cleared oral residuals with lingual sweep and thin liquid rinse without s/s of aspiration. No pocketing witnessed. Pt overall mod I for swallowing compensatory strategies. Recommend diet advancement to regular textures, thin liquids at this time. Recommend intermittent supervision during meals. Patient and spouse educated and verbalized agreement. SLP also reinforced swallow safety and patient returned understanding through teach back. Patient recalled safety strategies from physical therapy session earlier with sup A question cues. Patient was left in wheelchair with alarm activated and immediate needs within reach at end of session. Continue per current plan of care.      General    Pain  No pain  Therapy/Group: Individual Therapy  [name] 03/20/2021, 3:46 PM

## 2021-03-19 NOTE — Progress Notes (Signed)
Occupational Therapy Session Note  Patient Details  Name: Teresa [name] MRN: [id number] Date of Birth: 1941-01-07  Today's Date: 03/19/2021 OT Individual Time: 1003-1056 OT Individual Time Calculation (min): 53 min    Short Term Goals: Week 1:  OT Short Term Goal 1 (Week 1): Pt will don shirt with minA while maintaining balance EOB with min A OT Short Term Goal 1 - Progress (Week 1): Progressing toward goal OT Short Term Goal 2 (Week 1): Pt will transfer to toilet/ BSC with mod A consistently OT Short Term Goal 2 - Progress (Week 1): Met OT Short Term Goal 3 (Week 1): Pt will locate items on left side in ADL tasks with mod cues OT Short Term Goal 3 - Progress (Week 1): Met OT Short Term Goal 4 (Week 1): Pt will perform sit to stands for toileting tasks with mod A OT Short Term Goal 4 - Progress (Week 1): Met Week 2:  OT Short Term Goal 1 (Week 2): Pt will complete donn/doff pants with min assist and mod multimodal cues. OT Short Term Goal 2 (Week 2): Pt will exhibit dynamic standing balance with CGA at RW for standing self care tasks. OT Short Term Goal 3 (Week 2): Pt will be able to actively rotate head to left approximatly 60 degrees or more to attend to left side. OT Short Term Goal 4 (Week 2): Pt will donn/doff shirt overhead with min assist and mod multimodal cues.  Skilled Therapeutic Interventions/Progress Updates:    Pt received seated in w/c, no c/o pain and, agreeable to therapy. Session focus on self-care retraining, activity tolerance, transfer retraining, hemi dressing techniques in prep for improved ADL/IADL/func mobility performance + decreased caregiver burden. Requesting to shower. Ambulatory TTB transfer with min A to progress LLE and for balance with RW + L saddle splint. Min A to progress BLE over shower lip. Doffed shirt with max A to pull over head and remove BUE, total A to doff pants/brief/shoes/L AFO. Pt able to bathe UB with min A to bathe RUE, able to recall  one handed techniques to open bottles and apply soap to wash-cloth with increased time. Bathed LB with min A for balance when reaching for feet, able to bathe periarea via lateral leans. Exited shower in similar manner. Redonned B shoes/L AFO with total A and completed ambulatory transfer > w/c same manner as before. Applied deodorant with S, donned shirt with min A to thread LUE. Donned brief/pants with light mod A to thread LLE and to pull over L hip. Ambulated > sink to complete oral care in standing with min A for balance and set-up A for tooth brush. Ambulatory transfer > w/c with min to mod A to progress LLE due to fatigue.  Pt left seated in w/c with safety belt alarm engaged, call bell in reach, and all immediate needs met.    Therapy Documentation Precautions:  Precautions Precautions: Fall Precaution Comments: L hemipareisis, L neglect/ inattention Restrictions Weight Bearing Restrictions: No Other Position/Activity Restrictions: shoulder sling for comfort with mobility -- only if it doesn't bother her R cervical spine  Pain: no c/o   ADL: See Care Tool for more details.  Therapy/Group: Individual Therapy  Volanda Napoleon MS, OTR/L  03/19/2021, 6:48 AM

## 2021-03-20 NOTE — Progress Notes (Signed)
PROGRESS NOTE   Subjective/Complaints: No new complaints this morning Voiding well Has neck pain but trying to move through it Continues to have coldness in feet and hands  ROS: Patient denies fever, rash, sore throat,  nausea, vomiting, diarrhea, cough, shortness of breath or chest pain, headache, or mood change. +neck pain, +cold peripheral extremities   Objective:   No results found. No results for input(s): WBC, HGB, HCT, PLT in the last 72 hours.   Recent Labs    03/19/21 0811  NA 136  K 4.1  CL 101  CO2 23  GLUCOSE 102*  BUN 18  CREATININE 1.05*  CALCIUM 9.6     Intake/Output Summary (Last 24 hours) at 03/20/2021 0934 Last data filed at 03/20/2021 0831 Gross per 24 hour  Intake 460 ml  Output 1 ml  Net 459 ml        Physical Exam: Vital Signs Blood pressure 136/69, pulse 66, temperature 97.8 F (36.6 C), resp. rate 16, height 5\' 2"  (1.575 m), weight 68.2 kg, SpO2 100 %. Gen: no distress, normal appearing HEENT: oral mucosa pink and moist, NCAT Cardio: Reg rate Chest: normal effort, normal rate of breathing Abd: soft, non-distended Ext: no edema Psych: pleasant, normal affect Musculoskeletal:     Cervical back: Normal range of motion. No rigidity.      RUE/RLE 5/5 Skin:    Comments: Buttocks no wounds 2 spots on back of thighs- scratch and birth mark Hands a little cool but no obvious vasomotor signs Neurological:     Comments: Patient is alert.  speech clear.  Left C7.  reasonable insight and awareness. Sensation reduced to LT in LUE RUE/RLE 5/5 LUE- 1-2/5 grip and finger abd, 2/5 EF and EE LLE 3/5 HF.KE.DF and PF- improved        Assessment/Plan: 1. Functional deficits which require 3+ hours per day of interdisciplinary therapy in a comprehensive inpatient rehab setting. Physiatrist is providing close team supervision and 24 hour management of active medical problems listed  below. Physiatrist and rehab team continue to assess barriers to discharge/monitor patient progress toward functional and medical goals  Care Tool:  Bathing    Body parts bathed by patient: Face, Abdomen, Chest, Front perineal area, Buttocks, Right upper leg, Left upper leg, Right lower leg, Left lower leg, Left arm   Body parts bathed by helper: Right arm     Bathing assist Assist Level: Minimal Assistance - Patient > 75%     Upper Body Dressing/Undressing Upper body dressing   What is the patient wearing?: Pull over shirt    Upper body assist Assist Level: Moderate Assistance - Patient 50 - 74%    Lower Body Dressing/Undressing Lower body dressing      What is the patient wearing?: Pants, Incontinence brief     Lower body assist Assist for lower body dressing: Moderate Assistance - Patient 50 - 74%     Toileting Toileting    Toileting assist Assist for toileting: Moderate Assistance - Patient 50 - 74%     Transfers Chair/bed transfer  Transfers assist     Chair/bed transfer assist level: Minimal Assistance - Patient > 75%  Locomotion Ambulation   Ambulation assist   Ambulation activity did not occur: Safety/medical concerns  Assist level: Minimal Assistance - Patient > 75% Assistive device: Walker-rolling (L hand orthosis) Max distance: 80   Walk 10 feet activity   Assist  Walk 10 feet activity did not occur: Safety/medical concerns    Assistive device: Walker-rolling   Walk 50 feet activity   Assist Walk 50 feet with 2 turns activity did not occur: Safety/medical concerns  Assist level: Minimal Assistance - Patient > 75% Assistive device: Walker-rolling    Walk 150 feet activity   Assist Walk 150 feet activity did not occur: Safety/medical concerns         Walk 10 feet on uneven surface  activity   Assist Walk 10 feet on uneven surfaces activity did not occur: Safety/medical concerns          Wheelchair     Assist Is the patient using a wheelchair?: Yes Type of Wheelchair: Manual    Wheelchair assist level: Total Assistance - Patient < 25%, Dependent - Patient 0% Max wheelchair distance: 300 ft    Wheelchair 50 feet with 2 turns activity    Assist        Assist Level: Total Assistance - Patient < 25%   Wheelchair 150 feet activity     Assist      Assist Level: Total Assistance - Patient < 25%, Dependent - Patient 0%   Blood pressure 136/69, pulse 66, temperature 97.8 F (36.6 C), resp. rate 16, height 5\' 2"  (1.575 m), weight 68.2 kg, SpO2 100 %.  Medical Problem List and Plan: 1. 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 Ranell Patrick on 04/10/21  Commended on excellent progress 2. Impaired mobility: discontinue Lovenox since ambulating >300 feet. Discussed with patient.              -antiplatelet therapy: Aspirin 81 mg daily and Plavix 75 mg day x3 weeks then Plavix alone, d/c ASA on 12/26 3. Neck pain: kpad added-encouraged use. continue tylenol prn. Continue voltaren gel and flexeril 5mg  TID PRN. 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   -maintain posture, left shoulder support 4. Mood: Provide emotional support             -antipsychotic agents: N/A 5. Neuropsych: This patient is capable of making decisions on his own behalf. 6. Skin/Wound Care: Routine skin checks 7. Fluids/Electrolytes/Nutrition: Routine in and outs with follow-up chemistries 8.  Dysphagia.  Dysphagia #1 nectar liquids.  Follow-up speech therapy- might need IVFs at night since on nectar thick liquids   12/24 BUN better, have stopped IVF, continue to push po   -check labs again monday 9.  Hypertension.  Decrease Cozaar to 25mg  daily-may need to titrate further given restarting of home nisoldipine which helps with her  Raynaud's. Monitor with increased mobility. Continue tizanidine at night which will also help her sleep. Husband brought in nisoldipine34 mg that patient takes at home-placed order that she may use own med starting tomorrow  12/24 controled Vitals:   03/19/21 1921 03/20/21 0453  BP: 115/68 136/69  Pulse: 60 66  Resp: 14 16  Temp: 98.2 F (36.8 C) 97.8 F (36.6 C)  SpO2: 99% 100%  10.  Hyperlipidemia.  Crestor 11.  Hypothyroidism.  Synthroid 12.  Urinary retention.   Cannot use flomax due to sulfa allergy, Wean Urecholine to 25mg  TID. Discussed with patient. Maintain this dose -oob to void   13.  AKI.  Baseline creatinine 1.0-1.4. Follow-up chemistries- esp in light of nectar thick liquids. Placed nursing order to encourage 6-8 glasses of thickened water per day.   See above 14.  Obesity.  BMI 26.52.  Provided dietary education 15. Raynaud's syndrome: Husband brought in nisoldipine34 mg which she's now taking. Has not helped. Encouraged heating pads, aloe lotion 16. Cognitive deficits:higher level cont SLP.      LOS: 19 days A FACE TO FACE EVALUATION WAS PERFORMED  Clide Deutscher Julien Oscar 03/20/2021, 9:34 AM

## 2021-03-20 NOTE — Progress Notes (Signed)
Physical Therapy Session Note  Patient Details  Name: Teresa [name] MRN: [id number] Date of Birth: January 01, 1941  Today's Date: 03/20/2021 PT Individual Time: 7342-8768; 1157-2620 PT Individual Time Calculation (min): 58 min and 27 mins  Short Term Goals: Week 3:  PT Short Term Goal 1 (Week 3): STG = LTG due to ELOS  Skilled Therapeutic Interventions/Progress Updates:    Session 1: Patient received supine in bed, agreeable to PT. She denies pain. Able to come sit edge of bed with supervision. PT donning shoes + AFO with TotalA for time. CGA stand pivot to wc. PT transporting patient in wc to therapy gym for time management and energy conservation. Patient ambulating 25ft with RW + walk-on AFO with CGA/MinA. Verbal cues for increased L stride length. Patient with L knee flexion through stance and may benefit from increased knee stability in stance. Trialed Matrix Max GRAFO with increased knee stability, but slight genu recurvatum. Trialed Sprystep Max GRAFO with much improved gait mechanics noted. She may benefit from toe cap to assist in advancing L LE. Orthotist notified of need for AFO consult tomorrow at 1pm. Patient returning to room in wc, seatbelt alarm on, call light within reach.   Session 2: Patient received sitting up in wc, handoff from OT. She denies pain. PT transporting patient in wc to therapy gym for time management and energy conservation. She completed 12 mins on NuStep B LE only for improved large amplitude reciprocal stepping. Patient ambulating ~15ft back to her room with RW and MinA. Verbal cues for L LE abduction and increased foot clearance, especially with turns. Patient remaining up in wc, chair alarm on, call light within reach.    Therapy Documentation Precautions:  Precautions Precautions: Fall Precaution Comments: L hemipareisis, L neglect/ inattention Restrictions Weight Bearing Restrictions: No Other Position/Activity Restrictions: shoulder sling for  comfort with mobility -- only if it doesn't bother her R cervical spine     Therapy/Group: Individual Therapy  Karoline Caldwell, PT, DPT, CBIS  03/20/2021, 7:36 AM

## 2021-03-20 NOTE — Progress Notes (Signed)
Nutrition Follow-up  DOCUMENTATION CODES:   Not applicable  INTERVENTION:  Provide Ensure Enlive po BID, each supplement provides 350 kcal and 20 grams of protein   Encourage adequate PO intake.   NUTRITION DIAGNOSIS:   Increased nutrient needs related to  (therapy) as evidenced by estimated needs; ongoing  GOAL:   Patient will meet greater than or equal to 90% of their needs; progressing  MONITOR:   PO intake, Supplement acceptance, Labs, Weight trends, Diet advancement, Skin, I & O's  REASON FOR ASSESSMENT:    (Poor po)    ASSESSMENT:   80 year old female with history of hypothyroidism, Raynaud's disease presents with progressive onset of left-sided weakness and dysarthria. 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 Therapy evaluations completed due to patient's left-sided weakness and dysarthria was admitted for CIR.  Meal completion has been varied from 10-100% with 100% intake today. Pt currently has Ensure ordered and has been consuming them. RD to continue with current orders to aid in caloric and protein needs.   Labs and medications reviewed.   Diet Order:   Diet Order             DIET DYS 3 Room service appropriate? Yes; Fluid consistency: Thin  Diet effective now                   EDUCATION NEEDS:   Not appropriate for education at this time  Skin:  Skin Assessment: Reviewed RN Assessment  Last BM:  12/19  Height:   Ht Readings from Last 1 Encounters:  03/01/21 5\' 2"  (1.575 m)    Weight:   Wt Readings from Last 1 Encounters:  03/01/21 68.2 kg   BMI:  Body mass index is 27.5 kg/m.  Estimated Nutritional Needs:   Kcal:  1700-1850  Protein:  85-95 grams  Fluid:  >/= 1.7 L/day  Corrin Parker, MS, RD, LDN RD pager number/after hours weekend pager number on Amion.

## 2021-03-20 NOTE — Progress Notes (Signed)
Occupational Therapy Session Note  Patient Details  Name: Teresa [name] MRN: [id number] Date of Birth: 1940-10-26  Today's Date: 03/20/2021 OT Individual Time: 1332-1430 OT Individual Time Calculation (min): 58 min    Short Term Goals: Week 2:  OT Short Term Goal 1 (Week 2): Pt will complete donn/doff pants with min assist and mod multimodal cues. OT Short Term Goal 2 (Week 2): Pt will exhibit dynamic standing balance with CGA at RW for standing self care tasks. OT Short Term Goal 3 (Week 2): Pt will be able to actively rotate head to left approximatly 60 degrees or more to attend to left side. OT Short Term Goal 4 (Week 2): Pt will donn/doff shirt overhead with min assist and mod multimodal cues.  Skilled Therapeutic Interventions/Progress Updates:  Pt greeted seated in w/c agreeable to OT intervention. Session focus on functional mobility, dynamic standing balance, LUE AROM/NMR and decreasing overall burden of care. Pt transported to gym with total A where pt completed various standing LUE/LLE NMR therapeutic activities with pt instructed to stand to elevated mat table and reach out of BOS to L side to retrieve items with RUE in order to facilitate increased WB through LUE and LLE. Pt completed all standing therapeutic activities with MIN A- CGA. Pt also completed seated LUE AROM tasks including working on wrist pronation/supination with LUE positioned on table with pt instructed to supinate wrist and work on digit flexion/extension and repeat same therex with wrist pronated, pt able to complete wrist supination/pronation 2+/5, digit flexion/extension 2+/5. Pt transported back to room with total A where pt handed off to PT for next session                                       Therapy Documentation Precautions:  Precautions Precautions: Fall Precaution Comments: L hemipareisis, L neglect/ inattention Restrictions Weight Bearing Restrictions: No Other Position/Activity Restrictions:  shoulder sling for comfort with mobility -- only if it doesn't bother her R cervical spine  Pain: unrated pain reported in L shoulder, rest breaks, repositioning, decreasing ROM provided as pain mgmt strategy.     Therapy/Group: Individual Therapy  Corinne Ports [facility] 03/20/2021, 4:01 PM

## 2021-03-21 NOTE — Progress Notes (Signed)
Occupational Therapy Discharge Summary  Patient Details  Name: Teresa [name] MRN: [id number] Date of Birth: 05/03/1940   Patient has met 19 of 15 long term goals due to improved activity tolerance, improved balance, postural control, ability to compensate for deficits, improved attention, improved awareness, and improved coordination.  Patient to discharge at [facility] Assist level.  Patient's care partner is independent to provide the necessary physical and cognitive assistance at discharge.    Reasons goals not met: Pt continues to require up to min A to incorporate LUE functionally, continues to require min A to thread LUE during UB dressing.  Recommendation:  Patient will benefit from ongoing skilled OT services in home health setting to continue to advance functional skills in the area of BADL, iADL, and Reduce care partner burden.  Equipment: No equipment provided  Reasons for discharge: treatment goals met and discharge from hospital  Patient/family agrees with progress made and goals achieved: Yes  OT Discharge Precautions/Restrictions  Precautions Precautions: Fall Precaution Comments: L hemipareisis, L neglect/ inattention Restrictions Weight Bearing Restrictions: No  ADL ADL Where Assessed-Eating: Edge of bed Grooming: Supervision/safety Where Assessed-Grooming: Standing at sink Upper Body Bathing: Minimal assistance Where Assessed-Upper Body Bathing: Shower Lower Body Bathing: Minimal assistance Where Assessed-Lower Body Bathing: Shower Upper Body Dressing: Minimal assistance Where Assessed-Upper Body Dressing: Edge of bed Lower Body Dressing: Minimal assistance Where Assessed-Lower Body Dressing: Edge of bed Toileting: Maximal assistance Toilet Transfer: Minimal assistance Toilet Transfer Method: Counselling psychologist: Grab bars, Geophysical data processor: Minimal assistance Social research officer, government Method:  Heritage manager: Radio broadcast assistant, Grab bars ADL Comments: overall MIN A level for ADLS, with cues needed for attention to LUE and safety awareness Vision Baseline Vision/History: 1 Wears glasses Patient Visual Report: Blurring of vision;Other (comment) (reports blurry vision when reading  cell phone) Vision Assessment?: Yes Eye Alignment: Impaired (comment) Ocular Range of Motion: Within Functional Limits Alignment/Gaze Preference: Within Defined Limits Tracking/Visual Pursuits: Able to track stimulus in all quads without difficulty Saccades: Within functional limits Convergence: Within functional limits Visual Fields: No apparent deficits Additional Comments: L inattention noted during ADLS but no visual field cut from testing Perception  Perception: Impaired Inattention/Neglect: Does not attend to left side of body (improving but contines to be impaired) Praxis Praxis: Impaired Praxis Impairment Details: Initiation;Motor planning Praxis-Other Comments: continues to present with L inattention needing cues at times to incorporate LUE into tasks but improving with repetition Cognition Overall Cognitive Status: Impaired/Different from baseline Arousal/Alertness: Awake/alert Orientation Level: Oriented X4 Year: 2022 Month: December Day of Week: Correct Immediate Memory Recall: Sock;Blue;Bed Memory Recall Sock: Without Cue Memory Recall Blue: Without Cue Memory Recall Bed: Without Cue Sensation Sensation Light Touch: Appears Intact Hot/Cold: Appears Intact Motor  Motor Motor: Hemiplegia;Abnormal postural alignment and control;Abnormal tone Motor - Discharge Observations: L hemi Mobility  Bed Mobility Bed Mobility: Not assessed Transfers Sit to Stand: Contact Guard/Touching assist Stand to Sit: Contact Guard/Touching assist  Trunk/Postural Assessment  Cervical Assessment Cervical Assessment: Exceptions to [facility] (forward head) Thoracic  Assessment Thoracic Assessment: Exceptions to [facility] (rounded shoulders) Lumbar Assessment Lumbar Assessment: Exceptions to [facility] (posterior pelvic tilt in sitting) Postural Control Postural Control: Deficits on evaluation (reliance on BUE support on surface/RW in stance)  Balance Balance Balance Assessed: Yes Static Sitting Balance Static Sitting - Balance Support: Feet supported Static Sitting - Level of Assistance: 5: Stand by assistance Dynamic Sitting Balance Dynamic Sitting - Balance Support: During functional activity;Feet supported;Right upper extremity supported  Dynamic Sitting - Level of Assistance: 5: Stand by assistance Static Standing Balance Static Standing - Balance Support: During functional activity;Bilateral upper extremity supported Static Standing - Level of Assistance: 5: Stand by assistance Dynamic Standing Balance Dynamic Standing - Balance Support: During functional activity;Bilateral upper extremity supported Dynamic Standing - Level of Assistance: 5: Stand by assistance Extremity/Trunk Assessment RUE Assessment RUE Assessment: Within Functional Limits LUE Assessment LUE Assessment: Exceptions to [facility] LUE Body System: Neuro Brunstrum levels for arm and hand: Arm;Hand Brunstrum level for arm: Stage II Synergy is developing Brunstrum level for hand: Stage II Synergy is developing LUE Strength Left Shoulder Flexion: 2-/5 Left Elbow Flexion: 2-/5 Left Elbow Extension: 2-/5 Left Hand Gross Grasp: Impaired LUE Tone LUE Tone: Hypotonic   Precious Haws 03/21/2021, 9:24 AM

## 2021-03-21 NOTE — Progress Notes (Addendum)
Occupational Therapy Session Note  Patient Details  Name: Teresa [name] MRN: [id number] Date of Birth: February 08, 1941  Today's Date: 03/21/2021 OT Individual Time: 0800-0926 session 1 OT Individual Time Calculation (min): 86 min  Session 2: 1401-1501 ( 60 mins)   Short Term Goals: Week 2:  OT Short Term Goal 1 (Week 2): Pt will complete donn/doff pants with min assist and mod multimodal cues. OT Short Term Goal 2 (Week 2): Pt will exhibit dynamic standing balance with CGA at RW for standing self care tasks. OT Short Term Goal 3 (Week 2): Pt will be able to actively rotate head to left approximatly 60 degrees or more to attend to left side. OT Short Term Goal 4 (Week 2): Pt will donn/doff shirt overhead with min assist and mod multimodal cues.  Skilled Therapeutic Interventions/Progress Updates:  Session 1: Pt greeted  seated on EOB with NT present  agreeable to OT intervention. Session focus on BADL reeducation, functional mobility, dynamic standing balance and decreasing overall caregiver burden.  Pt agreeable to complete ambulatory toilet transfer from EOB>toilet with rw and MIN A for balance when initially standing from EOB. Pt completed 3/3 toileting tasks with CGA needing only anterior pericare. Pt completed ambulatory shower transfer to TTB in walkin shower with MIN A. Pt completed bathing from TTB with overall MIN A needing assist to wash back and RUE d/t L hemi. Pt completes posterior periarea bathing via lateral leans from shower seat. Pt with good carryover of hemitechniques and uses LUE as stabilizer during bathing. Pt exited shower with MIN A, education provided on fall prevention strategies when exiting shower. Pt completed ambulatory tranfser back to bed with MIN A with Rw, pt completed dressing from EOB needing MINA for UB dressing needing assist to orient shirt and thread LUE into shirt and MINA to don pants via sit<>stand. Pt needed total A to don shoes and AFO. Pt completed  functional mobility from EOB>sink to rw and MIN A. Pt stood at sink for standing oral care with supervision needing intermittent cues to remember to incorporate LUE into ADLS. Education provided on compensatory methods for ADLs such as pump soap bottles, and automatic toothpaste dispenser. Ambulatory transfer from sink to w/c with rw and MIN A.  pt left seated in w/c with chair alarm activated and all needs within reach.      Session 2:    Pt greeted seated in w/c with family present, pt agreeable to OT intervention. Session focus on BADL reeducation, functional mobility, family education and decreasing overall caregiver burden. Education provided on pts current level of assist for ADLS ( MIN A). Reinforced importance of having someone with pt when she is up and moving, discussed how to guard pt to during ambulation using gait belt. Pts daughter and husband, both demo'ed ambulatory toilet transfer from w/c>bathroom with Rw MOD cues for positioning during mobility and importance of communication with each other during all mobility tasks. Pt transported downstairs to ADL apt where pt completed functional mobility in/out of bed with daughter and husband both walking with her. Education provided on w/c parts and importance of locking brakes as well as fall prevention strategies for home. Pts family declined practicing shower transfer but verbally reviewed the steps. Education provided on general placement for LUE and encouraging pt to utilize LUE during all ADLS. Pt transported back to room with total A where pt left up in w/c with all needs within reach.  Therapy Documentation Precautions:  Precautions Precautions: Fall Precaution Comments: L hemipareisis, L neglect/ inattention Restrictions Weight Bearing Restrictions: No Other Position/Activity Restrictions: shoulder sling for comfort with mobility -- only if it doesn't bother her R cervical spine  Pain:  Session  1: no pain reported during session  Session 2: unrated pain in L shoulder, rest breaks and repositioning provided as needed.     Therapy/Group: Individual Therapy  Corinne Ports [facility] 03/21/2021, 9:58 AM

## 2021-03-21 NOTE — Progress Notes (Signed)
Physical Therapy Session Note  Patient Details  Name: Teresa [name] MRN: [id number] Date of Birth: Dec 04, 1940  Today's Date: 03/21/2021 PT Individual Time: 1300-1355 PT Individual Time Calculation (min): 55 min   Short Term Goals: Week 3:  PT Short Term Goal 1 (Week 3): STG = LTG due to ELOS  Skilled Therapeutic Interventions/Progress Updates:    Patient received sitting up in wc, agreeable to PT. She denies pain. Dtr and husband at bedside for family education. PT transporting patient in wc to therapy gym for time management and energy conservation. Orthotist present for AFO consult. Patient ambulating ~156ft with RW and MinA. Orthotist and PT deciding patient best suited for Matrix Max GRAFO with a toe cap. PT educating family on use of gait belt, guarding on L side, gait deviations with and without AFO, risk for falling, L inattention. Patient family ambulating ~140ft with RW and ~MinA. PT needing to intervene 2x to help prevent patient from falling. Family will benefit from additional family education and reinforcement of guarding and patients deficits. PT educating patient and family on safe step negotiation with RW to enter her house. Patient required Max verbal cuing to ascend with R LE and descend with L LE. PT providing MinA for 1 trial. Family then providing assist and PT needed to step in 2x to prevent patient from falling. PT extensively educating patient and family on moving slowly and intentionally and not rushing, or as patient puts it "trying to pass the test." Patient returning to room in wc, chair alarm on, call light within reach, family at bedside.  -PT intervening x2 to prevent fall -4" step x3 MinA/ModA  -PT intervening x3 to prevent fall  -Max cues for safety -up in chair  Therapy Documentation Precautions:  Precautions Precautions: Fall Precaution Comments: L hemipareisis, L neglect/ inattention Restrictions Weight Bearing Restrictions: No Other  Position/Activity Restrictions: shoulder sling for comfort with mobility -- only if it doesn't bother her R cervical spine    Therapy/Group: Individual Therapy  Karoline Caldwell, PT, DPT, CBIS  03/21/2021, 7:51 AM

## 2021-03-21 NOTE — Patient Care Conference (Signed)
Inpatient RehabilitationTeam Conference and Plan of Care Update Date: 03/21/2021   Time: 11:19 AM    Patient Name: Teresa [name] [facility]      Medical Record Number: [id number]  Date of Birth: 1940/11/11 Sex: Female         Room/Bed: 23C02C/5C02C-01 Payor Info: Payor: [employer] MEDICARE / Plan: [place] PPO / Product Type: *No Product type* /    Admit Date/Time:  03/01/2021  1:42 PM  Primary Diagnosis:  Right middle cerebral artery stroke [facility])  Hospital Problems: Principal Problem:   Right middle cerebral artery stroke [facility])    Expected Discharge Date: Expected Discharge Date: 03/23/21  Team Members Present: Physician leading conference: Dr. Leeroy Cha Social Worker Present: Ovidio Kin, LCSW Nurse Present: Dorien Chihuahua, RN PT Present: Francena Hanly, PT OT Present: Clyda Greener, OT SLP Present: Lillie Columbia, SLP     Current Status/Progress Goal Weekly Team Focus  Bowel/Bladder   Continent of B/B. LBM 12/27  Continue to void without needing caths.  Toilet Q2-3hr and PRN   Swallow/Nutrition/ Hydration   Diet advanced to reg/thin. Mod I  regular/thin with Mod I  tolerance of reg diet with safe swallowing compensatory strategies   ADL's   bathing with MIN A from shower level, UB dressing with MIN A, LB dressing with MIN A, transfers with Rw and MIN A, toileting with MIN A- CGA  MIN A- supervision  family education, DME training.   Mobility   CGA transfers, CGA STS/ gait >148ft  CGA transfers, minA gait  d/c planning, pt/fam ed, awareness   Communication             Safety/Cognition/ Behavioral Observations  sup A  sup A  family ed, error awareness, attention, med management   Pain   Neck pain, Kpad in place.  Pain <3/10  Assess Qshift and prn   Skin   Skin intact  Maintain skin integrity.  Assess Qshift and prn     Discharge Planning:  Family education today with husband and daughter, aware pt will require 24/7 care. Pt motivated to get as  independent as possible before DC   Team Discussion: Patient restarted on home meds per MD. Voiding on her own; PVRs discontinued. Neck pain addressed; decreased use of muscle relaxers during the day due to cognitive deficits. Continue to note Raynaud's symptoms. Patient requires cues for safety with poor insight into deficits.  Patient on target to meet rehab goals: yes, currently needs min assist for use of left UE, and, min assist for bathing and dressing. Requires CG assist for transfers and min assist for gait with left foot drag over longer distances. Goals for discharge set for CGA overall and min assist for gait and supervision for cognition.  *See Care Plan and progress notes for long and short-term goals.   Revisions to Treatment Plan:  AFO consult with toe tap   Teaching Needs: Safety, cues, medication management, secondary risk management, transfers, toileting, etc.   Current Barriers to Discharge: Decreased caregiver support  Possible Resolutions to Barriers: Family education scheduled 03/21/21     Medical Summary Current Status: neck pain, cold extremities, now voiding on her own, impaired cognition, left upper extremity weakness, right middle cerebral artery stroke    Barriers to Discharge Comments: neck pain, cold extremities, impaired cognition, left upper extremity weakness, right middle cerebral artery stroke Possible Resolutions to Barriers/Weekly Focus: continue tizanidine at night for muscle spasms/insomnia- wean as tolerated, continue heating pad during the day, continue calcium channel  blocker.   Continued Need for Acute Rehabilitation Level of Care: The patient requires daily medical management by a physician with specialized training in physical medicine and rehabilitation for the following reasons: Direction of a multidisciplinary physical rehabilitation program to maximize functional independence : Yes Medical management of patient stability for increased  activity during participation in an intensive rehabilitation regime.: Yes Analysis of laboratory values and/or radiology reports with any subsequent need for medication adjustment and/or medical intervention. : Yes   I attest that I was present, lead the team conference, and concur with the assessment and plan of the team.   Margarito Liner 03/21/2021, 3:21 PM

## 2021-03-21 NOTE — Progress Notes (Signed)
Family Education provided today with patient/husband/daughter Discussed current medications, purpose, dose, frequency, and route. Discussed safety/current transfer methods, disease processes, nutrition, and pain management.

## 2021-03-21 NOTE — Progress Notes (Signed)
Patient ID: Teresa [name], female   DOB: 01/25/1941, 80 y.o.   MRN: [id number] Met with pt, daughter and husband to discuss how education was going today. All report it went well but do plan to come in tomorrow for more, to make sure comfortable with pt's care needs. Discussed home health and equipment they have everything except the wheelchair which has been ordered. Will touch base tomorrow and plan for discharge Friday. Aware of team conference update and how well pt is doing in her therapies, needs to slow down and listen to the cues given. °

## 2021-03-21 NOTE — Discharge Summary (Signed)
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  Speech therapy follow-up for dysphagia as well as dysarthria.  Demonstrates improved attention problem solving and awareness.  Demonstrates improved oropharyngeal swallow.  Full family teaching completed plan discharged to home       Disposition: Discharge to home   Diet: Regular   Special Instructions: No driving smoking or alcohol  Continue to hold low Cozaar for now as blood pressure had remained soft.  Follow-up with PCP  Medications at discharge 1.  Tylenol as needed 2.  Urecholine 25 mg  3 times daily taper as directed 3.  Plavix 75 mg p.o. daily 4.  Flexeril 5 mg 3 times daily as needed muscle spasms 5.  Voltaren gel 2 g 4 times daily 6.  Synthroid 100 mcg p.o. daily 7.  Magnesium gluconate 250 mg p.o. nightly 8.  Multivitamin daily 9.  Sular 34 mg daily 10.  Protonix 40 mg daily 11.  MiraLAX daily hold for loose stools 12.  Crestor 10 mg p.o. nightly 13.  Zanaflex 2 mg p.o. nightly 14.  Neurontin 300 mg nightly  30-35 minutes were spent completing discharge summary and discharge planning  Discharge Instructions     Ambulatory referral to Neurology   Complete by: As directed    An appointment is requested in approximately: 4 weeks  right MCA infarction   Ambulatory referral to Physical Medicine Rehab   Complete by: As directed    Moderate complexity follow-up 1 to 2 weeks right MCA infarction        Follow-up Information     Raulkar, Clide Deutscher, MD Follow up.   Specialty: Physical Medicine and Rehabilitation Why: 04/10/21 please arrive at 1:20pm for 1:40pm follow-up appointment, thank you! Contact information: [address] [address] [phone]                 Signed: Cathlyn Parsons 03/23/2021, 5:18 AM

## 2021-03-21 NOTE — Progress Notes (Signed)
PROGRESS NOTE   Subjective/Complaints: Team conference this morning Needs an AFO with toe cap Needs cues for motor planning Questionable insight into deficits  ROS: Patient denies fever, rash, sore throat,  nausea, vomiting, diarrhea, cough, shortness of breath or chest pain, headache, or mood change. +neck pain, +cold peripheral extremities, +left sided weakness, +questionable insight regarding deficits as per team   Objective:   No results found. No results for input(s): WBC, HGB, HCT, PLT in the last 72 hours.   Recent Labs    03/19/21 0811  NA 136  K 4.1  CL 101  CO2 23  GLUCOSE 102*  BUN 18  CREATININE 1.05*  CALCIUM 9.6     Intake/Output Summary (Last 24 hours) at 03/21/2021 0913 Last data filed at 03/21/2021 0844 Gross per 24 hour  Intake 657 ml  Output --  Net 657 ml        Physical Exam: Vital Signs Blood pressure 109/83, pulse 66, temperature 97.8 F (36.6 C), resp. rate 16, height 5\' 2"  (1.575 m), weight 68.2 kg, SpO2 100 %. Gen: no distress, normal appearing HEENT: oral mucosa pink and moist, NCAT Cardio: Reg rate Chest: normal effort, normal rate of breathing Abd: soft, non-distended Ext: no edema Psych: pleasant, normal affect Musculoskeletal:     Cervical back: Normal range of motion. No rigidity.      RUE/RLE 5/5 Ambulating MinA with RW Skin:    Comments: Buttocks no wounds 2 spots on back of thighs- scratch and birth mark Hands a little cool but no obvious vasomotor signs Neurological:     Comments: Patient is alert.  speech clear.  Left C7.  reasonable insight and awareness. Sensation reduced to LT in LUE RUE/RLE 5/5 LUE- 1-2/5 grip and finger abd, 2/5 EF and EE LLE 3/5 HF.KE.DF and PF- improved        Assessment/Plan: 1. Functional deficits which require 3+ hours per day of interdisciplinary therapy in a comprehensive inpatient rehab setting. Physiatrist is providing  close team supervision and 24 hour management of active medical problems listed below. Physiatrist and rehab team continue to assess barriers to discharge/monitor patient progress toward functional and medical goals  Care Tool:  Bathing    Body parts bathed by patient: Face, Abdomen, Chest, Front perineal area, Buttocks, Right upper leg, Left upper leg, Right lower leg, Left lower leg, Left arm   Body parts bathed by helper: Right arm     Bathing assist Assist Level: Minimal Assistance - Patient > 75%     Upper Body Dressing/Undressing Upper body dressing   What is the patient wearing?: Pull over shirt    Upper body assist Assist Level: Moderate Assistance - Patient 50 - 74%    Lower Body Dressing/Undressing Lower body dressing      What is the patient wearing?: Pants, Incontinence brief     Lower body assist Assist for lower body dressing: Moderate Assistance - Patient 50 - 74%     Toileting Toileting    Toileting assist Assist for toileting: Moderate Assistance - Patient 50 - 74%     Transfers Chair/bed transfer  Transfers assist     Chair/bed transfer assist level: Minimal Assistance -  Patient > 75%     Locomotion Ambulation   Ambulation assist   Ambulation activity did not occur: Safety/medical concerns  Assist level: Minimal Assistance - Patient > 75% Assistive device: Walker-rolling (L hand orthosis) Max distance: 80   Walk 10 feet activity   Assist  Walk 10 feet activity did not occur: Safety/medical concerns    Assistive device: Walker-rolling   Walk 50 feet activity   Assist Walk 50 feet with 2 turns activity did not occur: Safety/medical concerns  Assist level: Minimal Assistance - Patient > 75% Assistive device: Walker-rolling    Walk 150 feet activity   Assist Walk 150 feet activity did not occur: Safety/medical concerns         Walk 10 feet on uneven surface  activity   Assist Walk 10 feet on uneven surfaces  activity did not occur: Safety/medical concerns         Wheelchair     Assist Is the patient using a wheelchair?: Yes Type of Wheelchair: Manual    Wheelchair assist level: Total Assistance - Patient < 25%, Dependent - Patient 0% Max wheelchair distance: 300 ft    Wheelchair 50 feet with 2 turns activity    Assist        Assist Level: Total Assistance - Patient < 25%   Wheelchair 150 feet activity     Assist      Assist Level: Total Assistance - Patient < 25%, Dependent - Patient 0%   Blood pressure 109/83, pulse 66, temperature 97.8 F (36.6 C), resp. rate 16, height 5\' 2"  (8.315 m), weight 68.2 kg, SpO2 100 %.  Medical Problem List and Plan: 1. 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 Ranell Patrick on 04/10/21  Commended on excellent progress  Team conference today.   Discussed wheelchair delivery.  2. Impaired mobility: discontinue Lovenox since ambulating >300 feet. Discussed with patient.              -antiplatelet therapy: continue Aspirin 81 mg daily and Plavix 75 mg day x3 weeks then Plavix alone, d/c ASA on 12/26 3. Neck pain: kpad added-encouraged use. continue tylenol prn. Continue voltaren gel and flexeril 5mg  TID PRN. 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 Advised purchasing heating pad for home  -maintain posture, left shoulder support 4. Mood: Provide emotional support             -antipsychotic agents: N/A 5. Neuropsych: This patient is capable of making decisions on his own behalf. 6. Skin/Wound Care: Routine skin checks 7. Fluids/Electrolytes/Nutrition: Routine in and outs with follow-up chemistries 8.  Dysphagia.  Dysphagia #1 nectar liquids.  Follow-up speech therapy- might need IVFs at night since on nectar thick liquids   12/24 BUN better, have stopped IVF, continue to push po    -check labs again monday 9.  Hypertension.  Decrease Cozaar to 25mg  daily-may need to titrate further given restarting of home nisoldipine which helps with her Raynaud's. Monitor with increased mobility. Continue tizanidine at night which will also help her sleep. Husband brought in nisoldipine34 mg that patient takes at home-placed order that she may use own med starting tomorrow  12/24 controled Vitals:   03/20/21 1925 03/21/21 0437  BP: 121/68 109/83  Pulse:  62 66  Resp: 18 16  Temp: 97.6 F (36.4 C) 97.8 F (36.6 C)  SpO2: 98% 100%    10.  Hyperlipidemia.  Crestor 11.  Hypothyroidism.  Synthroid 12.  Urinary retention.   Cannot use flomax due to sulfa allergy, Wean Urecholine to 25mg  TID. Discussed with patient. Maintain this dose -oob to void   13.  AKI.  Baseline creatinine 1.0-1.4. Follow-up chemistries- esp in light of nectar thick liquids. Placed nursing order to encourage 6-8 glasses of thickened water per day.   See above 14.  Obesity.  BMI 26.52.  Provided dietary education 15. Raynaud's syndrome: Husband brought in nisoldipine34 mg which she's now taking. Has not helped. Encouraged heating pads, aloe lotion. Discussed foods that can increase nitric oxide production and improve blood flow.  16. Cognitive deficits:higher level cont SLP.      LOS: 20 days A FACE TO FACE EVALUATION WAS PERFORMED  Teresa [name] Teresa [name] 03/21/2021, 9:13 AM

## 2021-03-22 ENCOUNTER — Other Ambulatory Visit (HOSPITAL_COMMUNITY): Payer: Self-pay

## 2021-03-22 MED ORDER — OMEGA-3-ACID ETHYL ESTERS 1 G PO CAPS
1000.0000 mg | ORAL_CAPSULE | Freq: Every day | ORAL | 0 refills | Status: AC
  Filled 2021-03-22: qty 30, 30d supply, fill #0

## 2021-03-22 MED ORDER — ACETAMINOPHEN 325 MG PO TABS: 650.0000 mg | ORAL_TABLET | ORAL | Status: DC | PRN

## 2021-03-22 MED ORDER — VITAMIN D3 25 MCG PO TABS
1000.0000 [IU] | ORAL_TABLET | Freq: Every day | ORAL | 0 refills | Status: AC
  Filled 2021-03-22: qty 30, 30d supply, fill #0

## 2021-03-22 MED ORDER — POLYETHYLENE GLYCOL 3350 17 G PO PACK: 17.0000 g | PACK | Freq: Every day | ORAL | 0 refills | Status: DC

## 2021-03-22 MED ORDER — PANTOPRAZOLE SODIUM 40 MG PO TBEC
40.0000 mg | DELAYED_RELEASE_TABLET | Freq: Every day | ORAL | 0 refills | Status: DC
  Filled 2021-03-22: qty 30, 30d supply, fill #0

## 2021-03-22 MED ORDER — MAGNESIUM OXIDE 400 MG PO TABS
400.0000 mg | ORAL_TABLET | Freq: Every day | ORAL | 0 refills | Status: DC
  Filled 2021-03-22: qty 30, 30d supply, fill #0

## 2021-03-22 MED ORDER — CLOPIDOGREL BISULFATE 75 MG PO TABS
75.0000 mg | ORAL_TABLET | Freq: Every day | ORAL | 0 refills | Status: DC
  Filled 2021-03-22: qty 30, 30d supply, fill #0

## 2021-03-22 MED ORDER — GABAPENTIN 300 MG PO CAPS
300.0000 mg | ORAL_CAPSULE | Freq: Every day | ORAL | 0 refills | Status: AC
  Filled 2021-03-22: qty 30, 30d supply, fill #0

## 2021-03-22 MED ORDER — CYCLOBENZAPRINE HCL 5 MG PO TABS
5.0000 mg | ORAL_TABLET | Freq: Three times a day (TID) | ORAL | 0 refills | Status: DC | PRN
  Filled 2021-03-22: qty 60, 20d supply, fill #0

## 2021-03-22 MED ORDER — CALCIUM CARB-CHOLECALCIFEROL 600-10 MG-MCG PO TABS
2.0000 | ORAL_TABLET | Freq: Every day | ORAL | 0 refills | Status: AC
  Filled 2021-03-22: qty 60, 30d supply, fill #0

## 2021-03-22 MED ORDER — DICLOFENAC SODIUM 1 % EX GEL
2.0000 g | Freq: Four times a day (QID) | CUTANEOUS | 0 refills | Status: DC
  Filled 2021-03-22: qty 200, 25d supply, fill #0

## 2021-03-22 MED ORDER — BETHANECHOL CHLORIDE 25 MG PO TABS
25.0000 mg | ORAL_TABLET | Freq: Three times a day (TID) | ORAL | 0 refills | Status: DC
  Filled 2021-03-22: qty 21, 7d supply, fill #0

## 2021-03-22 MED ORDER — LEVOTHYROXINE SODIUM 100 MCG PO TABS
100.0000 ug | ORAL_TABLET | Freq: Every day | ORAL | 0 refills | Status: AC
  Filled 2021-03-22: qty 30, 30d supply, fill #0

## 2021-03-22 MED ORDER — TIZANIDINE HCL 2 MG PO TABS
2.0000 mg | ORAL_TABLET | Freq: Every day | ORAL | 0 refills | Status: DC
  Filled 2021-03-22: qty 30, 30d supply, fill #0

## 2021-03-22 MED ORDER — ROSUVASTATIN CALCIUM 10 MG PO TABS
10.0000 mg | ORAL_TABLET | Freq: Every day | ORAL | 0 refills | Status: AC
  Filled 2021-03-22: qty 30, 30d supply, fill #0

## 2021-03-22 NOTE — Progress Notes (Signed)
Speech Language Pathology Discharge Summary  Patient Details  Name: Teresa [name] MRN: [id number] Date of Birth: January 18, 1941  Today's Date: 03/22/2021 SLP Individual Time: 1500-1530 SLP Individual Time Calculation (min): 30 min   Skilled Therapeutic Interventions:  Patient seen with spouse and daughter present for family/discharge education. SLP reviewed progress with swallow function and recommended patient use her judgement when encountering food items she has difficulty chewing. SLP also recommended patient work with family on complex problem solving tasks, to adhere to PT and OT recommendations and not attempt to make any changes in recommendations without consulting with them (here and upon discharge ([facility]). Patient left in chair with family present and all needs within reach. She is appropriate for discharge at this time.     Patient has met 6 of 6 long term goals.  Patient to discharge at overall Supervision;Modified Independent level.  Reasons goals not met: N/A   Clinical Impression/Discharge Summary: Patient made very good progress and met all 6 LTG's. At time of discharge, she is functioning at a modified independent level for swallow safety, tolerating a regular texture solids, thin liquids diet. She is at supervision A level for complex problem solving, attention and reasoning tasks but for basic safety awareness she is at a supervision to mod I level. Patient continues with monotone voice but this is improving and patient commenting, responding, etc at conversational level without needing cues to initiate or elaborate. Patient is appropriate for discharge home with family at this time.  Care Partner:        Recommendation:  24 hour supervision/assistance;Outpatient SLP;Home Health SLP  Rationale for SLP Follow Up: Maximize cognitive function and independence   Equipment: N/A for speech   Reasons for discharge: Discharged from hospital;Treatment goals met    Patient/Family Agrees with Progress Made and Goals Achieved: Yes   Sonia Baller, MA, CCC-SLP Speech Therapy

## 2021-03-22 NOTE — Progress Notes (Signed)
Inpatient Rehabilitation Discharge Medication Review by a Pharmacist  A complete drug regimen review was completed for this patient to identify any potential clinically significant medication issues.  High Risk Drug Classes Is patient taking? Indication by Medication  Antipsychotic No   Anticoagulant No   Antibiotic No   Opioid No   Antiplatelet Yes plavix- CVA prophylaxis  Hypoglycemics/insulin No   Vasoactive Medication Yes Sular- hypertension  Chemotherapy No   Other Yes Crestor- HLD Lovaza- hyper-triglycerides Synthroid- hypothyroidism Pantoprazole- GERD Miralax- constipation     Type of Medication Issue Identified Description of Issue Recommendation(s)  Drug Interaction(s) (clinically significant)     Duplicate Therapy     Allergy     No Medication Administration End Date     Incorrect Dose     Additional Drug Therapy Needed     Significant med changes from prior encounter (inform family/care partners about these prior to discharge).    Other       Clinically significant medication issues were identified that warrant physician communication and completion of prescribed/recommended actions by midnight of the next day:  No   Time spent performing this drug regimen review (minutes):  30  Fama Muenchow BS, PharmD, BCPS Clinical Pharmacist  03/22/2021 11:54 AM

## 2021-03-22 NOTE — Plan of Care (Signed)
Problem: RH Dressing Goal: LTG Patient will perform upper body dressing (OT) Description: LTG Patient will perform upper body dressing with assist, with/without cues (OT). Outcome: Not Met (add Reason) Flowsheets (Taken 03/22/2021 1448) LTG: Pt will perform upper body dressing with assistance level of: (Continues to require min A to thread LUE.) --   Problem: RH Functional Use of Upper Extremity Goal: LTG Patient will use RT/LT upper extremity as a (OT) Description: LTG: Patient will use right/left upper extremity as a stabilizer/gross assist/diminished/nondominant/dominant level with assist, with/without cues during functional activity (OT) Outcome: Not Met (add Reason) Flowsheets (Taken 03/22/2021 1448) LTG: Use of upper extremity in functional activities: (Continues to require up to min A to incorporate LUE functionally.) --   Problem: RH Balance Goal: LTG: Patient will maintain dynamic sitting balance (OT) Description: LTG:  Patient will maintain dynamic sitting balance with assistance during activities of daily living (OT) Outcome: Completed/Met Goal: LTG Patient will maintain dynamic standing with ADLs (OT) Description: LTG:  Patient will maintain dynamic standing balance with assist during activities of daily living (OT)  Outcome: Completed/Met   Problem: Sit to Stand Goal: LTG:  Patient will perform sit to stand in prep for activites of daily living with assistance level (OT) Description: LTG:  Patient will perform sit to stand in prep for activites of daily living with assistance level (OT) Outcome: Completed/Met   Problem: RH Grooming Goal: LTG Patient will perform grooming w/assist,cues/equip (OT) Description: LTG: Patient will perform grooming with assist, with/without cues using equipment (OT) Outcome: Completed/Met   Problem: RH Bathing Goal: LTG Patient will bathe all body parts with assist levels (OT) Description: LTG: Patient will bathe all body parts with assist  levels (OT) Outcome: Completed/Met   Problem: RH Dressing Goal: LTG Patient will perform lower body dressing w/assist (OT) Description: LTG: Patient will perform lower body dressing with assist, with/without cues in positioning using equipment (OT) Outcome: Completed/Met   Problem: RH Toileting Goal: LTG Patient will perform toileting task (3/3 steps) with assistance level (OT) Description: LTG: Patient will perform toileting task (3/3 steps) with assistance level (OT)  Outcome: Completed/Met   Problem: RH Toilet Transfers Goal: LTG Patient will perform toilet transfers w/assist (OT) Description: LTG: Patient will perform toilet transfers with assist, with/without cues using equipment (OT) Outcome: Completed/Met   Problem: RH Tub/Shower Transfers Goal: LTG Patient will perform tub/shower transfers w/assist (OT) Description: LTG: Patient will perform tub/shower transfers with assist, with/without cues using equipment (OT) Outcome: Completed/Met   Problem: RH Memory Goal: LTG Patient will demonstrate ability for day to day recall/carry over during activities of daily living with assistance level (OT) Description: LTG:  Patient will demonstrate ability for day to day recall/carry over during activities of daily living with assistance level (OT). Outcome: Completed/Met   Problem: RH Attention Goal: LTG Patient will demonstrate this level of attention during functional activites (OT) Description: LTG:  Patient will demonstrate this level of attention during functional activites  (OT) Outcome: Completed/Met   Problem: RH Awareness Goal: LTG: Patient will demonstrate awareness during functional activites type of (OT) Description: LTG: Patient will demonstrate awareness during functional activites type of (OT) Outcome: Completed/Met   Problem: RH Furniture Transfers Goal: LTG Patient will perform furniture transfers w/assist (OT/PT) Description: LTG: Patient will perform furniture  transfers  with assistance (OT/PT). Outcome: Completed/Met

## 2021-03-22 NOTE — Progress Notes (Signed)
Occupational Therapy Session Note  Patient Details  Name: Teresa [name] MRN: [id number] Date of Birth: [birth year]  Today's Date: 03/22/2021 OT Individual Time: 3382-5053 OT Individual Time Calculation (min): 46 min    Short Term Goals: Week 2:  OT Short Term Goal 1 (Week 2): Pt will complete donn/doff pants with min assist and mod multimodal cues. OT Short Term Goal 2 (Week 2): Pt will exhibit dynamic standing balance with CGA at RW for standing self care tasks. OT Short Term Goal 3 (Week 2): Pt will be able to actively rotate head to left approximatly 60 degrees or more to attend to left side. OT Short Term Goal 4 (Week 2): Pt will donn/doff shirt overhead with min assist and mod multimodal cues.  Skilled Therapeutic Interventions/Progress Updates:  Pt greeted supine in bed  agreeable to OT intervention. Session focus on BADL reeducation, functional mobility, dynamic standing balance and decreasing overall caregiver burden. Pt reports increased in L shoulder, pt reports "sleeping on it wrong." Pt requested to apply personal TENS unit for pain mgmt. Donned TENS unit on LUE for pain mgmt set at 3.5- 4 for during of session ( 30 mins total). Pt completed dressing from EOB with MIN A, pt is able to verbally recall correct technique but needs assist to implement into dressing. Pt needed MIN A to don pants needing most assist to pull pants up to waist line on L side and for balance assist. Pt completed ambulatory transfer from EOB>sink for grooming tasks with pt able to complete oral care in standing with supervision, good carryover of education from last session to relation to compensatory methods for ADLs. Pt completed functional mobility to w/c with rw and CGA, pt still needs intermittent cues to remember to place LUE in walker splint as well as remembering to remove L hand when sitting. Issued pt dycem to be placed in walker splint as pt reports her hand slips out. Pt left seated in w/c with  chair alarm activated and all needs within reach.   Therapy Documentation Precautions:  Precautions Precautions: Fall Precaution Comments: L hemipareisis, L neglect/ inattention Restrictions Weight Bearing Restrictions: No Other Position/Activity Restrictions: shoulder sling for comfort with mobility -- only if it doesn't bother her R cervical spine     Therapy/Group: Individual Therapy  Corinne Ports [facility] 03/22/2021, 12:04 PM

## 2021-03-22 NOTE — Discharge Summary (Signed)
Physical Therapy Discharge Summary  Patient Details  Name: Teresa [name] MRN: [id number] Date of Birth: September 24, 1940  Patient has met 6 of 12 long term goals due to improved activity tolerance, improved balance, improved postural control, increased strength, ability to compensate for deficits, functional use of  left upper extremity and left lower extremity, improved attention, and improved awareness.  Patient to discharge at an ambulatory level [place].   Patient's care partner is independent to provide the necessary physical and cognitive assistance at discharge. Family education has been ongoing since her hospitalization with formal training scheduled as well. Husband and daughter trained in home safety, transfers, and short distance gait.   Reasons goals not met: Pt required minA for dynamic standing balance due decreased righting reactions and ability to reduce L truncal lean. Similarly with transfers, she required minA overall for safety and inconsistencies with performance. MinA was also needed for stairs due to L sided weakness, decreased safety awareness, and impaired ability to recall proper sequencing. Family training completed to ensure family can provide appropriate assist for safe DC.   Recommendation:  Patient will benefit from ongoing skilled PT services in home health setting to continue to advance safe functional mobility, address ongoing impairments in LLE NMR, dynamic balance, gait training, home safety, caregiver training, and minimize fall risk.  Equipment: 18x18 w/c. Pt owns RW  Reasons for discharge: treatment goals met and discharge from hospital  Patient/family agrees with progress made and goals achieved: Yes  PT Discharge Precautions/Restrictions Precautions Precautions: Fall Precaution Comments: L hemipareisis, L neglect/ inattention Restrictions Weight Bearing Restrictions: No Vital Signs Therapy Vitals Temp: (!) 97.3 F (36.3 C) Temp Source:  Oral Pulse Rate: 77 Resp: 15 BP: 111/61 Patient Position (if appropriate): Sitting Oxygen Therapy SpO2: 100 % O2 Device: Room Air Pain Interference Pain Interference Pain Effect on Sleep: 3. Frequently Pain Interference with Therapy Activities: 2. Occasionally Pain Interference with Day-to-Day Activities: 3. Frequently Vision/Perception  Vision - History Ability to See in Adequate Light: 0 Adequate Perception Perception: Impaired Inattention/Neglect: Does not attend to left side of body (improving but continues to be impaired) Praxis Praxis: Impaired Praxis Impairment Details: Motor planning Praxis-Other Comments: continues to have L inattention, needing cues to incoporate LUE into tasks.  Cognition Overall Cognitive Status: Impaired/Different from baseline Arousal/Alertness: Awake/alert Orientation Level: Oriented X4 Attention: Sustained;Selective Sustained Attention: Appears intact Sustained Attention Impairment: Functional basic Selective Attention: Impaired Selective Attention Impairment: Verbal basic;Functional basic Problem Solving: Impaired Problem Solving Impairment: Verbal complex;Functional complex Safety/Judgment: Impaired Sensation Sensation Light Touch: Appears Intact Hot/Cold: Appears Intact Proprioception: Appears Intact Stereognosis: Appears Intact Coordination Gross Motor Movements are Fluid and Coordinated: No Coordination and Movement Description: Limited by L hemi and some apraxia Motor  Motor Motor: Hemiplegia;Abnormal postural alignment and control;Abnormal tone Motor - Discharge Observations: L hemi  Mobility Bed Mobility Bed Mobility: Supine to Sit;Sit to Supine Rolling Right: Supervision/verbal cueing Rolling Left: Supervision/Verbal cueing Left Sidelying to Sit: Supervision/Verbal cueing Supine to Sit: Supervision/Verbal cueing Sit to Supine: Supervision/Verbal cueing Sit to Sidelying Left: Supervision/Verbal  cueing Transfers Transfers: Sit to Stand;Stand to Sit;Stand Pivot Transfers;Squat Pivot Transfers Sit to Stand: Contact Guard/Touching assist Stand to Sit: Contact Guard/Touching assist Stand Pivot Transfers: Contact Guard/Touching assist Squat Pivot Transfers: Contact Guard/Touching assist Transfer (Assistive device): Rolling walker Locomotion  Gait Ambulation: Yes Gait Assistance: Minimal Assistance - Patient > 75% Gait Distance (Feet): 150 Feet Assistive device: Rolling walker Gait Assistance Details: Verbal cues for safe use of DME/AE;Verbal cues for precautions/safety;Verbal cues for  technique;Verbal cues for sequencing;Verbal cues for gait pattern;Tactile cues for weight shifting;Tactile cues for posture;Tactile cues for placement;Manual facilitation for weight shifting Gait Gait: Yes Gait Pattern: Impaired Gait Pattern: Step-through pattern;Decreased step length - right;Decreased stance time - left;Decreased dorsiflexion - left;Decreased hip/knee flexion - left;Left flexed knee in stance;Trunk flexed;Lateral trunk lean to left;Narrow base of support;Poor foot clearance - left Gait velocity: decreased Stairs / Additional Locomotion Stairs: Yes Stairs Assistance: Minimal Assistance - Patient > 75% Stair Management Technique: One rail Right Number of Stairs: 12 Height of Stairs: 6  Trunk/Postural Assessment  Cervical Assessment Cervical Assessment: Exceptions to [facility] (forward head) Thoracic Assessment Thoracic Assessment: Exceptions to [facility] (rounded shlders) Lumbar Assessment Lumbar Assessment: Exceptions to [facility] (posteror pelvic tilt) Postural Control Postural Control: Deficits on evaluation (reliance of BUE support to RW/surface)  Balance Balance Balance Assessed: Yes Static Sitting Balance Static Sitting - Balance Support: Feet supported Static Sitting - Level of Assistance: 5: Stand by assistance Dynamic Sitting Balance Dynamic Sitting - Balance Support: During  functional activity;Feet supported;Right upper extremity supported Dynamic Sitting - Level of Assistance: 5: Stand by assistance Static Standing Balance Static Standing - Balance Support: Bilateral upper extremity supported Static Standing - Level of Assistance: 5: Stand by assistance Dynamic Standing Balance Dynamic Standing - Balance Support: During functional activity;Bilateral upper extremity supported Dynamic Standing - Level of Assistance: 4: Min assist Extremity Assessment  RUE Assessment RUE Assessment: Within Functional Limits LUE Assessment LUE Assessment: Exceptions to [facility] LUE Body System: Neuro Brunstrum levels for arm and hand: Arm;Hand Brunstrum level for arm: Stage II Synergy is developing Brunstrum level for hand: Stage II Synergy is developing LUE Strength Left Shoulder Flexion: 2-/5 Left Elbow Flexion: 2-/5 Left Elbow Extension: 2-/5 Left Hand Gross Grasp: Impaired LUE Tone LUE Tone: Hypotonic RLE Assessment RLE Assessment: Within Functional Limits General Strength Comments: Grossly 4+/ 5 prox to distal LLE Assessment LLE Assessment: Exceptions to [facility] LLE Strength LLE Overall Strength: Deficits Left Hip Flexion: 3-/5 Left Hip Extension: 3/5 Left Hip ABduction: 3/5 Left Hip ADduction: 3/5 Left Knee Flexion: 3+/5 Left Knee Extension: 4-/5 Left Ankle Dorsiflexion: 3-/5 Left Ankle Plantar Flexion: 2/5    Troy Kanouse P Dave Mannes PT 03/22/2021, 3:36 PM

## 2021-03-22 NOTE — Progress Notes (Signed)
Physical Therapy Session Note  Patient Details  Name: Teresa [name] MRN: [id number] Date of Birth: 04-29-1940  Today's Date: 03/22/2021 PT Individual Time: 1402-1500 PT Individual Time Calculation (min): 58 min   Short Term Goals:  Week 3:  PT Short Term Goal 1 (Week 3): STG = LTG due to ELOS   Skilled Therapeutic Interventions/Progress Updates:   Pt received sitting in WC and agreeable to PT. Family present for education. Car transfer with min assist from family and CGA from PT with cues for guarding technique and improved use of sit>pivot to reduce fall risk. .   Gait training with RW 3x17ft with CGA for safety and cues for improved awareness of LLE as well as increased step length on in turns. Step management with RW to simulate access to home. Performed x 6 with CGA from PT and min assist from PT with moderate cues from PT for safe guarding technique to reduce fall risk and improve instruction to manage AD and improve awareness of LLE. Marland Kitchen   PT instructed pt in Grad day assessment to measure progress toward goals. See below for details.  Patient returned to room and left sitting in [facility] with call bell in reach and all needs met.         Therapy Documentation Precautions:  Precautions Precautions: Fall Precaution Comments: L hemipareisis, L neglect/ inattention Restrictions Weight Bearing Restrictions: No Other Position/Activity Restrictions: shoulder sling for comfort with mobility -- only if it doesn't bother her R cervical spine  Pain: Pain Assessment Pain Scale: 0-10 Pain Score: 0-No pain Mobility: Bed Mobility Bed Mobility: Supine to Sit;Sit to Supine Rolling Right: Supervision/verbal cueing Rolling Left: Supervision/Verbal cueing Left Sidelying to Sit: Supervision/Verbal cueing Supine to Sit: Supervision/Verbal cueing Sit to Supine: Supervision/Verbal cueing Sit to Sidelying Left: Supervision/Verbal cueing Transfers Transfers: Sit to Stand;Stand to  Sit;Stand Pivot Transfers;Squat Pivot Transfers Sit to Stand: Contact Guard/Touching assist Stand to Sit: Contact Guard/Touching assist Stand Pivot Transfers: Contact Guard/Touching assist Squat Pivot Transfers: Contact Guard/Touching assist Transfer (Assistive device): Rolling walker Locomotion : Gait Ambulation: Yes Gait Assistance: Minimal Assistance - Patient > 75% Gait Distance (Feet): 150 Feet Assistive device: Rolling walker Gait Assistance Details: Verbal cues for safe use of DME/AE;Verbal cues for precautions/safety;Verbal cues for technique;Verbal cues for sequencing;Verbal cues for gait pattern;Tactile cues for weight shifting;Tactile cues for posture;Tactile cues for placement;Manual facilitation for weight shifting Gait Gait: Yes Gait Pattern: Impaired Gait Pattern: Step-through pattern;Decreased step length - right;Decreased stance time - left;Decreased dorsiflexion - left;Decreased hip/knee flexion - left;Left flexed knee in stance;Trunk flexed;Lateral trunk lean to left;Narrow base of support;Poor foot clearance - left Gait velocity: decreased Stairs / Additional Locomotion Stairs: Yes Stairs Assistance: Minimal Assistance - Patient > 75% Stair Management Technique: One rail Right Number of Stairs: 12 Height of Stairs: 6  Trunk/Postural Assessment : Cervical Assessment Cervical Assessment: Exceptions to [facility] (forward head) Thoracic Assessment Thoracic Assessment: Exceptions to [facility] (rounded shlders) Lumbar Assessment Lumbar Assessment: Exceptions to [facility] (posteror pelvic tilt) Postural Control Postural Control: Deficits on evaluation (reliance of BUE support to RW/surface)  Balance: Balance Balance Assessed: Yes Static Sitting Balance Static Sitting - Balance Support: Feet supported Static Sitting - Level of Assistance: 5: Stand by assistance Dynamic Sitting Balance Dynamic Sitting - Balance Support: During functional activity;Feet supported;Right upper extremity  supported Dynamic Sitting - Level of Assistance: 5: Stand by assistance Static Standing Balance Static Standing - Balance Support: Bilateral upper extremity supported Static Standing - Level of Assistance: 5: Stand by assistance  Dynamic Standing Balance Dynamic Standing - Balance Support: During functional activity;Bilateral upper extremity supported Dynamic Standing - Level of Assistance: 4: Min assist  Therapy/Group: Individual Therapy  Lorie Phenix 03/22/2021, 5:51 PM

## 2021-03-22 NOTE — Progress Notes (Signed)
PROGRESS NOTE   Subjective/Complaints: No new complaints this morning Neck is hurting worse than usual so I have asked Danae Chen to give her a flexeril and patient will use her TENS unit and heating pad  ROS: Patient denies fever, rash, sore throat,  nausea, vomiting, diarrhea, cough, shortness of breath or chest pain, headache, or mood change. +neck pain- worse this morning, +cold peripheral extremities, +left sided weakness, +questionable insight regarding deficits as per team   Objective:   No results found. No results for input(s): WBC, HGB, HCT, PLT in the last 72 hours.   No results for input(s): NA, K, CL, CO2, GLUCOSE, BUN, CREATININE, CALCIUM in the last 72 hours.    Intake/Output Summary (Last 24 hours) at 03/22/2021 1245 Last data filed at 03/22/2021 0930 Gross per 24 hour  Intake 678 ml  Output --  Net 678 ml        Physical Exam: Vital Signs Blood pressure 111/72, pulse 62, temperature 97.9 F (36.6 C), temperature source Oral, resp. rate 16, height 5\' 2"  (1.575 m), weight 68.2 kg, SpO2 99 %. Gen: no distress, normal appearing HEENT: oral mucosa pink and moist, NCAT Cardio: Reg rate Chest: normal effort, normal rate of breathing Abd: soft, non-distended Ext: no edema Psych: pleasant, normal affect Musculoskeletal:     Cervical back: Normal range of motion. No rigidity.      RUE/RLE 5/5 Ambulating MinA with RW Skin:    Comments: Buttocks no wounds 2 spots on back of thighs- scratch and birth mark Hands a little cool but no obvious vasomotor signs Neurological:     Comments: Patient is alert.  speech clear.  Left C7.  reasonable insight and awareness. Sensation reduced to LT in LUE RUE/RLE 5/5 LUE- 1-2/5 grip and finger abd, 2/5 EF and EE LLE 3/5 HF.KE.DF and PF- improved        Assessment/Plan: 1. Functional deficits which require 3+ hours per day of interdisciplinary therapy in a  comprehensive inpatient rehab setting. Physiatrist is providing close team supervision and 24 hour management of active medical problems listed below. Physiatrist and rehab team continue to assess barriers to discharge/monitor patient progress toward functional and medical goals  Care Tool:  Bathing    Body parts bathed by patient: Face, Abdomen, Chest, Front perineal area, Buttocks, Right upper leg, Left upper leg, Right lower leg, Left lower leg, Left arm   Body parts bathed by helper: Right arm     Bathing assist Assist Level: Minimal Assistance - Patient > 75%     Upper Body Dressing/Undressing Upper body dressing   What is the patient wearing?: Pull over shirt    Upper body assist Assist Level: Minimal Assistance - Patient > 75%    Lower Body Dressing/Undressing Lower body dressing      What is the patient wearing?: Pants, Incontinence brief     Lower body assist Assist for lower body dressing: Minimal Assistance - Patient > 75%     Toileting Toileting    Toileting assist Assist for toileting: Contact Guard/Touching assist     Transfers Chair/bed transfer  Transfers assist     Chair/bed transfer assist level: Minimal Assistance - Patient >  75%     Locomotion Ambulation   Ambulation assist   Ambulation activity did not occur: Safety/medical concerns  Assist level: Minimal Assistance - Patient > 75% Assistive device: Walker-rolling (L hand orthosis) Max distance: 80   Walk 10 feet activity   Assist  Walk 10 feet activity did not occur: Safety/medical concerns    Assistive device: Walker-rolling   Walk 50 feet activity   Assist Walk 50 feet with 2 turns activity did not occur: Safety/medical concerns  Assist level: Minimal Assistance - Patient > 75% Assistive device: Walker-rolling    Walk 150 feet activity   Assist Walk 150 feet activity did not occur: Safety/medical concerns         Walk 10 feet on uneven surface   activity   Assist Walk 10 feet on uneven surfaces activity did not occur: Safety/medical concerns         Wheelchair     Assist Is the patient using a wheelchair?: Yes Type of Wheelchair: Manual    Wheelchair assist level: Total Assistance - Patient < 25%, Dependent - Patient 0% Max wheelchair distance: 300 ft    Wheelchair 50 feet with 2 turns activity    Assist        Assist Level: Total Assistance - Patient < 25%   Wheelchair 150 feet activity     Assist      Assist Level: Total Assistance - Patient < 25%, Dependent - Patient 0%   Blood pressure 111/72, pulse 62, temperature 97.9 F (36.6 C), temperature source Oral, resp. rate 16, height 5\' 2"  (1.575 m), weight 68.2 kg, SpO2 99 %.  Medical Problem List and Plan: 1. 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 Ranell Patrick on 04/10/21  Commended on excellent progress  Team conference today.   Discussed wheelchair delivery.  2. Impaired mobility: discontinue Lovenox since ambulating >300 feet. Discussed with patient.              -antiplatelet therapy: continue Aspirin 81 mg daily and Plavix 75 mg day x3 weeks then Plavix alone, d/c ASA on 12/26 3. Neck pain: kpad added-encouraged use. continue tylenol prn. Continue voltaren gel and flexeril 5mg  TID PRN. Encouraged use of TENS unit. 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 Advised purchasing heating pad for home  -maintain posture, left shoulder support 4. Mood: Provide emotional support             -antipsychotic agents: N/A 5. Neuropsych: This patient is capable of making decisions on his own behalf. 6. Skin/Wound Care: Routine skin checks 7. Fluids/Electrolytes/Nutrition: Routine in and outs with follow-up chemistries 8.  Dysphagia.  Dysphagia #1 nectar liquids.  Follow-up speech therapy- might  need IVFs at night since on nectar thick liquids   12/24 BUN better, have stopped IVF, continue to push po   -check labs again monday 9.  Hypertension.  Decrease Cozaar to 25mg  daily-may need to titrate further given restarting of home nisoldipine which helps with her Raynaud's. Monitor with increased mobility. Continue tizanidine at night which will also help her sleep. Husband brought in nisoldipine34 mg that patient takes at home-placed order that she may use own med starting tomorrow  12/24 controled Vitals:   03/21/21 1944 03/22/21 0457  BP: 108/63 111/72  Pulse: 63 62  Resp: 18 16  Temp: 98 F (36.7 C) 97.9 F (36.6 C)  SpO2: 98% 99%    10.  Hyperlipidemia.  Continue Crestor 11.  Hypothyroidism.  Synthroid 12.  Urinary retention.   Cannot use flomax due to sulfa allergy, Wean Urecholine to 25mg  TID. Discussed with patient. Maintain this dose -oob to void   13.  AKI.  Baseline creatinine 1.0-1.4. Follow-up chemistries- esp in light of nectar thick liquids. Placed nursing order to encourage 6-8 glasses of thickened water per day.   See above 14.  Obesity.  BMI 26.52.  Provided dietary education 15. Raynaud's syndrome: Husband brought in nisoldipine34 mg which she's now taking. Has not helped. Encouraged heating pads, aloe lotion. Discussed foods that can increase nitric oxide production and improve blood flow.  16. Cognitive deficits:higher level cont SLP.      LOS: 21 days A FACE TO FACE EVALUATION WAS PERFORMED  Martha Clan P Margie Brink 03/22/2021, 12:45 PM

## 2021-03-22 NOTE — Progress Notes (Signed)
Occupational Therapy Session Note  Patient Details  Name: Keyuana Wank MRN: [id number] Date of Birth: 05-15-1940  Today's Date: 03/22/2021 OT Individual Time: 4656-8127 OT Individual Time Calculation (min): 53 min    Short Term Goals: Week 1:  OT Short Term Goal 1 (Week 1): Pt will don shirt with minA while maintaining balance EOB with min A OT Short Term Goal 1 - Progress (Week 1): Progressing toward goal OT Short Term Goal 2 (Week 1): Pt will transfer to toilet/ BSC with mod A consistently OT Short Term Goal 2 - Progress (Week 1): Met OT Short Term Goal 3 (Week 1): Pt will locate items on left side in ADL tasks with mod cues OT Short Term Goal 3 - Progress (Week 1): Met OT Short Term Goal 4 (Week 1): Pt will perform sit to stands for toileting tasks with mod A OT Short Term Goal 4 - Progress (Week 1): Met  Skilled Therapeutic Interventions/Progress Updates:    Pt received seated in w/c with daughter/ husband present, ongoing L neck pain with activity but improves with rest, agreeable to therapy. Session focus on self-care retraining, activity tolerance, transfer retraining, LUE management, LUE NMR, family education in prep for improved ADL/IADL/func mobility performance + decreased caregiver burden. Daughter able to facilitate ambulatory toilet transfer providing CGA with use of RW + L saddle splint. Appropriate use of gait belt and safety precautions with equipment. Pt able to don B shoes with S and elastic shoe laces. AFO to be delivered to room later today. Reviewed current dressing performance at overall min A via hemi-techniques. Family reports feeling comfortable with mobility/ADL as reviewed in yesterday's education session.   Reviewed LUE resting hand splint application, hemiplegic therapeutic positioning and management. Issued printed HEP including forward towel slides, putty squeezes, scapular elevation/retraction/protraction. Pt able to return demonstration of therex  appropriate. Issued soft tan theraputty and pt able to practice flattening against table, squeezing, and rolling back and forth. Reviewed mental imagery practice and provided link to audio guides ([email or web address].)  Family/pt with no further questions at this time.  Pt left seated in w/c with family present awaiting following PT session, call bell in reach, and all immediate needs met.    Therapy Documentation Precautions:  Precautions Precautions: Fall Precaution Comments: L hemipareisis, L neglect/ inattention Restrictions Weight Bearing Restrictions: No Other Position/Activity Restrictions: shoulder sling for comfort with mobility -- only if it doesn't bother her R cervical spine  Pain:   See session note ADL: See Care Tool for more details.  Therapy/Group: Individual Therapy  Volanda Napoleon MS, OTR/L  03/22/2021, 6:54 AM

## 2021-03-22 NOTE — Progress Notes (Signed)
Inpatient Rehabilitation Care Coordinator Discharge Note   Patient Details  Name: Teresa [name] MRN: [id number] Date of Birth: September 30, 1940   Discharge location: [place] TO ASSIST BUT DOES WORK  Length of Stay: 22 DAYS  Discharge activity level: CGA-MIN LEVEL  Home/community participation: ACTIVE  Patient response IF:BPPHKF Literacy - How often do you need to have someone help you when you read instructions, pamphlets, or other written material from your doctor or pharmacy?: Never  Patient response EX:MDYJWL Isolation - How often do you feel lonely or isolated from those around you?: Never  Services provided included: MD, RD, PT, OT, SLP, RN, CM, TR, Pharmacy, SW  Financial Services:  Charity fundraiser Utilized: Environmental education officer MEDICARE  Choices offered to/list presented to: PT AND HUSBAND  Follow-up services arranged:  Home Health, DME, Patient/Family has no preference for HH/DME agencies Home Health Agency: [facility]-PT, OT, SP, AIDE    DME : [facility]-WHEELCHAIR GETTING REST OF EQUIPMENT FOR SON WHO HAS THE BATHROOM AND [place]    Patient response to transportation need: Is the patient able to respond to transportation needs?: Yes In the past 12 months, has lack of transportation kept you from medical appointments or from getting medications?: No In the past 12 months, has lack of transportation kept you from meetings, work, or from getting things needed for daily living?: No    Comments (or additional information):HUSBAND AND DAUGHTER WERE IN FOR TWO DAYS OF [place].  Patient/Family verbalized understanding of follow-up arrangements:  Yes  Individual responsible for coordination of the follow-up plan: ROBERT=HUSBAND [phone]  Confirmed correct DME delivered: Elease Hashimoto 03/22/2021    [place], Gardiner [place]

## 2021-03-23 ENCOUNTER — Other Ambulatory Visit (HOSPITAL_COMMUNITY): Payer: Self-pay

## 2021-03-23 NOTE — Progress Notes (Signed)
PA Dan in with pt/family to discuss discharge instructions. Pt/family in agreement. Pt/family had no further questions. Pt belongings gathered, meds returned to family. Pt left per wheelchair to private vehicle. No complications noted.  Sheela Stack, LPN

## 2021-03-23 NOTE — Progress Notes (Signed)
PROGRESS NOTE   Subjective/Complaints: No new complaints this morning.  Ambulated 215 feet yesterday! Discussed discharge plan, follow-up plan Using heating pad for her pain  ROS: Patient denies fever, rash, sore throat,  nausea, vomiting, diarrhea, cough, shortness of breath or chest pain, headache, or mood change. +neck pain- worse this morning, +cold peripheral extremities, +left sided weakness, +questionable insight regarding deficits as per team   Objective:   No results found. No results for input(s): WBC, HGB, HCT, PLT in the last 72 hours.   No results for input(s): NA, K, CL, CO2, GLUCOSE, BUN, CREATININE, CALCIUM in the last 72 hours.    Intake/Output Summary (Last 24 hours) at 03/23/2021 0912 Last data filed at 03/23/2021 0626 Gross per 24 hour  Intake 792 ml  Output 301 ml  Net 491 ml        Physical Exam: Vital Signs Blood pressure 123/62, pulse 60, temperature 97.6 F (36.4 C), resp. rate 18, height 5\' 2"  (1.575 m), weight 68.2 kg, SpO2 99 %. Gen: no distress, normal appearing HEENT: oral mucosa pink and moist, NCAT Cardio: Reg rate Chest: normal effort, normal rate of breathing Abd: soft, non-distended Ext: no edema Psych: pleasant, normal affect Musculoskeletal:     Cervical back: Normal range of motion. No rigidity.      RUE/RLE 5/5 Ambulating MinA with RW Skin:    Comments: Buttocks no wounds 2 spots on back of thighs- scratch and birth mark Hands a little cool but no obvious vasomotor signs Neurological:     Comments: Patient is alert.  speech clear.  Left C7.  reasonable insight and awareness. Sensation reduced to LT in LUE RUE/RLE 5/5 LUE- 1-2/5 grip and finger abd, 2/5 EF and EE LLE 3/5 HF.KE.DF and PF- improved        Assessment/Plan: 1. Functional deficits which require 3+ hours per day of interdisciplinary therapy in a comprehensive inpatient rehab setting. Physiatrist is  providing close team supervision and 24 hour management of active medical problems listed below. Physiatrist and rehab team continue to assess barriers to discharge/monitor patient progress toward functional and medical goals  Care Tool:  Bathing    Body parts bathed by patient: Face, Abdomen, Chest, Front perineal area, Buttocks, Right upper leg, Left upper leg, Right lower leg, Left lower leg, Left arm   Body parts bathed by helper: Right arm     Bathing assist Assist Level: Minimal Assistance - Patient > 75%     Upper Body Dressing/Undressing Upper body dressing   What is the patient wearing?: Pull over shirt    Upper body assist Assist Level: Minimal Assistance - Patient > 75%    Lower Body Dressing/Undressing Lower body dressing      What is the patient wearing?: Pants, Incontinence brief     Lower body assist Assist for lower body dressing: Minimal Assistance - Patient > 75%     Toileting Toileting    Toileting assist Assist for toileting: Contact Guard/Touching assist     Transfers Chair/bed transfer  Transfers assist     Chair/bed transfer assist level: Minimal Assistance - Patient > 75%     Locomotion Ambulation   Ambulation assist  Ambulation activity did not occur: Safety/medical concerns  Assist level: Minimal Assistance - Patient > 75% Assistive device: Walker-rolling (L hand orthosis) Max distance: 80   Walk 10 feet activity   Assist  Walk 10 feet activity did not occur: Safety/medical concerns    Assistive device: Walker-rolling   Walk 50 feet activity   Assist Walk 50 feet with 2 turns activity did not occur: Safety/medical concerns  Assist level: Minimal Assistance - Patient > 75% Assistive device: Walker-rolling    Walk 150 feet activity   Assist Walk 150 feet activity did not occur: Safety/medical concerns         Walk 10 feet on uneven surface  activity   Assist Walk 10 feet on uneven surfaces activity did  not occur: Safety/medical concerns         Wheelchair     Assist Is the patient using a wheelchair?: Yes Type of Wheelchair: Manual    Wheelchair assist level: Total Assistance - Patient < 25%, Dependent - Patient 0% Max wheelchair distance: 300 ft    Wheelchair 50 feet with 2 turns activity    Assist        Assist Level: Total Assistance - Patient < 25%   Wheelchair 150 feet activity     Assist      Assist Level: Total Assistance - Patient < 25%, Dependent - Patient 0%   Blood pressure 123/62, pulse 60, temperature 97.6 F (36.4 C), resp. rate 18, height 5\' 2"  (1.575 m), weight 68.2 kg, SpO2 99 %.  Medical Problem List and Plan: 1. 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 Ranell [name] on 04/10/21  Commended on excellent progress  Discussed wheelchair delivery.  2. Impaired mobility: discontinue Lovenox since ambulating >300 feet. Discussed with patient.              -antiplatelet therapy: continue Aspirin 81 mg daily and Plavix 75 mg day x3 weeks then Plavix alone, d/c ASA on 12/26 3. Neck pain: kpad added-encouraged use. continue tylenol prn. Continue voltaren gel and flexeril 5mg  TID PRN. Encouraged use of TENS unit. 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 Advised purchasing heating pad for home  -maintain posture, left shoulder support 4. Mood: Provide emotional support             -antipsychotic agents: N/A 5. Neuropsych: This patient is capable of making decisions on his own behalf. 6. Skin/Wound Care: Routine skin checks 7. Fluids/Electrolytes/Nutrition: Routine in and outs with follow-up chemistries 8.  Dysphagia.  Dysphagia #1 nectar liquids.  Follow-up speech therapy- might need IVFs at night since on nectar thick liquids   12/24 BUN better, have stopped IVF, continue to push po   -check labs again monday 9.  Hypertension.   Continue Cozaar to 25mg  daily-may need to titrate further given restarting of home nisoldipine which helps with her Raynaud's. Monitor with increased mobility. Continue tizanidine at night which will also help her sleep. Husband brought in nisoldipine34 mg that patient takes at home-placed order that she may use own med  Vitals:   03/22/21 1929 03/23/21 0310  BP: 109/63 123/62  Pulse: 78 60  Resp: 18 18  Temp: 97.8 F (36.6 C) 97.6 F (36.4 C)  SpO2: 94% 99%    10.  Hyperlipidemia.  Continue Crestor 11.  Hypothyroidism.  Synthroid 12.  Urinary retention.   Cannot use flomax due to sulfa allergy, Wean Urecholine to 25mg  TID. Discussed with patient. Maintain this dose -oob to void   13.  AKI.  Baseline creatinine 1.0-1.4. Follow-up chemistries- esp in light of nectar thick liquids. Placed nursing order to encourage 6-8 glasses of thickened water per day.   See above 14.  Obesity.  BMI 26.52.  Provided dietary education 15. Raynaud's syndrome: Husband brought in nisoldipine34 mg which she's now taking. Has not helped. Encouraged heating pads, aloe lotion. Discussed foods that can increase nitric oxide production and improve blood flow.  16. Cognitive deficits:higher level cont SLP.    >30 minutes spent in discharge of patient including review of medications and follow-up appointments, physical examination, and in answering all patient's questions      LOS: 22 days A FACE TO FACE EVALUATION WAS PERFORMED  Teresa [name] Teresa [name] 03/23/2021, 9:12 AM

## 2021-03-27 ENCOUNTER — Telehealth: Payer: Self-pay

## 2021-03-27 NOTE — Telephone Encounter (Signed)
Transitional Care Call--who you spoke with Herbie Baltimore (Son)   Are you/is patient experiencing any problems since coming home? No problem. Are there any questions regarding any aspect of care? No.  Are there any questions regarding medications administration/dosing? None. Are meds being taken as prescribed?Yes. Patient should review meds with caller to confirm. Medication reviewed.  Have there been any falls? No Has Home Health been to the house and/or have they contacted you? Yes. If not, have you tried to contact them? No problem. Can we help you contact them?N/A Are bowels and bladder emptying properly? Yes. Are there any unexpected incontinence issues? No.If applicable, is patient following bowel/bladder programs?N/A Any fevers, problems with breathing, unexpected pain?No Are there any skin problems or new areas of breakdown?No Has the patient/family member arranged specialty MD follow up (ie cardiology/neurology/renal/surgical/etc)?  Yes. Can we help arrange?No need.  Does the patient need any other services or support that we can help arrange? Are caregivers following through as expected in assisting the patient?Yes.        11. Has the patient quit smoking, drinking alcohol, or using drugs as recommended? Patient does not indulge.   Appointment Date/Time/ Arrival time/ and who they are seeing Dr. Ranell Patrick on 04/10/2021 at 1:40pm [place]

## 2021-03-27 NOTE — Telephone Encounter (Signed)
Teresa [name] has only a few does of Urecholine on hand. The family wanted to know if you will refill the script? They thought she was to be winged off it.   If [facility] [address]  Call back phone [phone] Teresa [name] her Son).

## 2021-03-28 ENCOUNTER — Telehealth: Payer: Self-pay | Admitting: *Deleted

## 2021-03-28 ENCOUNTER — Other Ambulatory Visit: Payer: Self-pay | Admitting: Physical Medicine and Rehabilitation

## 2021-03-28 MED ORDER — BETHANECHOL CHLORIDE 10 MG PO TABS: 10.0000 mg | ORAL_TABLET | Freq: Three times a day (TID) | ORAL | 3 refills | Status: DC

## 2021-03-28 NOTE — Telephone Encounter (Signed)
Notified Rx sent in and to wean off as she can.

## 2021-03-28 NOTE — Telephone Encounter (Signed)
[place] PT with [facility] called for POC 2wk4, 1wk1.  Approval given.

## 2021-03-29 ENCOUNTER — Telehealth (HOSPITAL_COMMUNITY): Payer: Self-pay | Admitting: Pharmacist

## 2021-03-29 ENCOUNTER — Other Ambulatory Visit (HOSPITAL_COMMUNITY): Payer: Self-pay

## 2021-04-02 ENCOUNTER — Other Ambulatory Visit (HOSPITAL_COMMUNITY): Payer: Self-pay

## 2021-04-03 ENCOUNTER — Telehealth: Payer: Self-pay | Admitting: *Deleted

## 2021-04-03 ENCOUNTER — Other Ambulatory Visit: Payer: Self-pay | Admitting: Physical Medicine and Rehabilitation

## 2021-04-03 MED ORDER — NYSTATIN 100000 UNIT/ML MT SUSP: 5.0000 mL | Freq: Four times a day (QID) | OROMUCOSAL | 0 refills | Status: DC

## 2021-04-03 NOTE — Telephone Encounter (Signed)
Diedre called from [facility] and says that Teresa [name] has thrush on her tongue and is asking for an Rx to be sent in for nystatin swish and swallow and a refill because they have been seeing it in pts and it generally lasts longer than 10 days and they have to call back for refill.  Please advise.

## 2021-04-04 ENCOUNTER — Telehealth: Payer: Self-pay

## 2021-04-04 MED ORDER — CLOPIDOGREL BISULFATE 75 MG PO TABS: 75.0000 mg | ORAL_TABLET | Freq: Every day | ORAL | 0 refills | Status: AC

## 2021-04-04 NOTE — Telephone Encounter (Signed)
Patient called and stated the Nystatin mouthwash was not called into the pharmacy. [facility] and [facility] stated [facility] was picked up

## 2021-04-04 NOTE — Telephone Encounter (Signed)
RX refill for Plavix sent to [facility]

## 2021-04-04 NOTE — Telephone Encounter (Signed)
Notified. 

## 2021-04-05 ENCOUNTER — Other Ambulatory Visit (HOSPITAL_COMMUNITY): Payer: Self-pay

## 2021-04-07 ENCOUNTER — Other Ambulatory Visit: Payer: Self-pay | Admitting: Physical Medicine and Rehabilitation

## 2021-04-10 ENCOUNTER — Encounter: Payer: Medicare PPO | Attending: Physical Medicine and Rehabilitation | Admitting: Physical Medicine and Rehabilitation

## 2021-04-10 ENCOUNTER — Encounter: Payer: Self-pay | Admitting: Physical Medicine and Rehabilitation

## 2021-04-10 ENCOUNTER — Other Ambulatory Visit: Payer: Self-pay

## 2021-04-10 ENCOUNTER — Telehealth: Payer: Self-pay

## 2021-04-10 VITALS — BP 130/82 | HR 80 | Temp 97.9°F | Ht 62.0 in | Wt 140.0 lb

## 2021-04-10 DIAGNOSIS — M7918 Myalgia, other site: Secondary | ICD-10-CM | POA: Insufficient documentation

## 2021-04-10 DIAGNOSIS — I63511 Cerebral infarction due to unspecified occlusion or stenosis of right middle cerebral artery: Secondary | ICD-10-CM | POA: Diagnosis not present

## 2021-04-10 MED ORDER — NAC 600 MG PO CAPS: 600.0000 mg | ORAL_CAPSULE | Freq: Two times a day (BID) | ORAL | 0 refills | Status: AC

## 2021-04-10 MED ORDER — NYSTATIN 100000 UNIT/ML MT SUSP: 5.0000 mL | Freq: Four times a day (QID) | OROMUCOSAL | 3 refills | Status: DC

## 2021-04-10 NOTE — Progress Notes (Signed)
Subjective:    Patient ID: Teresa [name], female    DOB: 14-Oct-1940, 81 y.o.   MRN: [id number]  HPI Mrs. Dishner is a 81 year old woman who presents for follow-up after CIR admission post-stroke  1) Urinary retention -weaned off urecholine.   2) Neck pain: -killing her -using the heating pad -massaged for a couple of days by daughter -going to try TENS unit   3) Raynaud's syndrome: -back on her home medicine.   4) HTN - well controlled    Pain Inventory Average Pain 2 Pain Right Now 2 My pain is sharp and swelling  LOCATION OF PAIN  neck and left shoulder  BOWEL Number of stools per week: 3    BLADDER Normal    Mobility use a walker how many minutes can you walk? 3 ability to climb steps?  no Do you have any goals in this area?  yes  Function retired I need assistance with the following:  feeding, dressing, and bathing Do you have any goals in this area?  no  Neuro/Psych trouble walking  Prior Studies Any changes since last visit?  no  Physicians involved in your care Any changes since last visit?  no   Family History  Problem Relation Age of Onset   Breast cancer Neg Hx    Social History   Socioeconomic History   Marital status: Married    Spouse name: Cydnee Fuquay   Number of children: Not on file   Years of education: Not on file   Highest education level: Not on file  Occupational History   Not on file  Tobacco Use   Smoking status: Never   Smokeless tobacco: Never  Vaping Use   Vaping Use: Never used  Substance and Sexual Activity   Alcohol use: Never   Drug use: Never   Sexual activity: Not on file  Other Topics Concern   Not on file  Social History Narrative   Not on file   Social Determinants of Health   Financial Resource Strain: Not on file  Food Insecurity: Not on file  Transportation Needs: Not on file  Physical Activity: Not on file  Stress: Not on file  Social Connections: Not on file   Past  Surgical History:  Procedure Laterality Date   [place]  2010   Cervical fusion C4-5-6-7   CATARACT EXTRACTION, BILATERAL Bilateral 10/2016   CHOLECYSTECTOMY  1995   COLONOSCOPY WITH PROPOFOL N/A 12/08/2018   Procedure: COLONOSCOPY WITH PROPOFOL;  Surgeon: Toledo, Benay Pike, MD;  Location: [facility] ENDOSCOPY;  Service: Gastroenterology;  Laterality: N/A;   EYE SURGERY     Past Medical History:  Diagnosis Date   Cancer ([place])    skin   Hypothyroidism    Raynaud's disease    BP 130/82    Pulse 80    Temp 97.9 F (36.6 C)    Ht 5\' 2"  (1.575 m)    Wt 140 lb (63.5 kg)    SpO2 100%    BMI 25.61 kg/m   Opioid Risk Score:   Fall Risk Score:  `1  Depression screen PHQ 2/9  Depression screen PHQ 2/9 04/10/2021  Decreased Interest 0  Down, Depressed, Hopeless 0  PHQ - 2 Score 0  Altered sleeping 0  Tired, decreased energy 0  Change in appetite 0  Feeling bad or failure about yourself  0  Trouble concentrating 0  Moving slowly or  fidgety/restless 0  Suicidal thoughts 0  PHQ-9 Score 0  Difficult doing work/chores Somewhat difficult     Review of Systems  Constitutional: Negative.   HENT: Negative.    Eyes: Negative.   Respiratory: Negative.    Cardiovascular: Negative.   Gastrointestinal: Negative.   Endocrine: Negative.   Genitourinary: Negative.   Musculoskeletal:  Positive for neck pain and neck stiffness.  Skin: Negative.   Allergic/Immunologic: Negative.   Neurological: Negative.   Hematological: Negative.   Psychiatric/Behavioral: Negative.        Objective:   Physical Exam  Gen: no distress, normal appearing HEENT: oral mucosa pink and moist, NCAT Cardio: Reg rate Chest: normal effort, normal rate of breathing Abd: soft, non-distended Ext: left hand swelling Psych: pleasant, normal affect Skin: intact Neuro: Ambulating with RW    Assessment & Plan:  1) CVA -continue therapies -provided dietary and  exercise counseling -discussed that hypertension is number one reversible risk factor for stroke.  2) HTN: -continue tizanidine -Advised regarding healthy foods that can help lower blood pressure and provided with a list: 1) citrus foods- high in vitamins and minerals 2) salmon and other fatty fish - reduces inflammation and oxylipins 3) swiss chard (leafy green)- high level of nitrates 4) pumpkin seeds- one of the best natural sources of magnesium 5) Beans and lentils- high in fiber, magnesium, and potassium 6) Berries- high in flavonoids 7) Amaranth (whole grain, can be cooked similarly to rice and oats)- high in magnesium and fiber 8) Pistachios- even more effective at reducing BP than other nuts 9) Carrots- high in phenolic compounds that relax blood vessels and reduce inflammation 10) Celery- contain phthalides that relax tissues of arterial walls 11) Tomatoes- can also improve cholesterol and reduce risk of heart disease 12) Broccoli- good source of magnesium, calcium, and potassium 13) Greek yogurt: high in potassium and calcium 14) Herbs and spices: Celery seed, cilantro, saffron, lemongrass, black cumin, ginseng, cinnamon, cardamom, sweet basil, and ginger 15) Chia and flax seeds- also help to lower cholesterol and blood sugar 16) Beets- high levels of nitrates that relax blood vessels  17) spinach and bananas- high in potassium  -Provided lise of supplements that can help with hypertension:  1) magnesium: one high quality brand is Bioptemizers since it contains all 7 types of magnesium, otherwise over the counter magnesium gluconate 400mg  is a good option 2) B vitamins 3) vitamin D 4) potassium 5) CoQ10 6) L-arginine 7) Vitamin C 8) Beetroot -Educated that goal BP is 120/80. -Made goal to incorporate some of the above foods into diet.

## 2021-04-10 NOTE — Patient Instructions (Addendum)
NAC 600mg  BID Bioptemizer's magnesium: magnesium glycinate.

## 2021-04-10 NOTE — Telephone Encounter (Signed)
Error. Patient has an office visit today. Faxed request in chart.

## 2021-04-11 ENCOUNTER — Telehealth: Payer: Self-pay

## 2021-04-11 NOTE — Telephone Encounter (Signed)
[facility]atin 10 day supply. [facility] called [facility] to get clarification. [facility] that [facility] a prescription for a 10 day supply of Nystatin but they also have a 3 day supply but patient never picked up so they can put it back and fill the 10 day supply. Verbal order given for them to put back 3 day supply and fill the 10 day supply.

## 2021-04-17 DIAGNOSIS — I69354 Hemiplegia and hemiparesis following cerebral infarction affecting left non-dominant side: Secondary | ICD-10-CM | POA: Insufficient documentation

## 2021-04-25 NOTE — Telephone Encounter (Signed)
ERROR

## 2021-05-17 ENCOUNTER — Encounter: Payer: Self-pay | Admitting: Neurology

## 2021-05-17 ENCOUNTER — Ambulatory Visit: Payer: Medicare PPO | Admitting: Neurology

## 2021-05-17 VITALS — Ht 62.0 in | Wt 144.0 lb

## 2021-05-17 DIAGNOSIS — I63511 Cerebral infarction due to unspecified occlusion or stenosis of right middle cerebral artery: Secondary | ICD-10-CM | POA: Diagnosis not present

## 2021-05-17 DIAGNOSIS — I639 Cerebral infarction, unspecified: Secondary | ICD-10-CM

## 2021-05-17 NOTE — Patient Instructions (Signed)
I had a long d/w patient and her husband about her recent cryptogenic stroke, risk for recurrent stroke/TIAs, personally independently reviewed imaging studies and stroke evaluation results and answered questions.Continue Plavix (clopidogrel ) 75 mg daily  for secondary stroke prevention and maintain strict control of hypertension with blood pressure goal below 130/90, diabetes with hemoglobin A1c goal below 6.5% and lipids with LDL cholesterol goal below 70 mg/dL. I also advised the patient to eat a healthy diet with plenty of whole grains, cereals, fruits and vegetables, exercise regularly and maintain ideal body weight . Refer for outpatient loop recorder for PAF. Patient prefers it being done in [place].Followup in the future with my NP in 3 months. Stroke Prevention Some medical conditions and behaviors can lead to a higher chance of having a stroke. You can help prevent a stroke by eating healthy, exercising, not smoking, and managing any medical conditions you have. Stroke is a leading cause of functional impairment. Primary prevention is particularly important because a majority of strokes are first-time events. Stroke changes the lives of not only those who experience a stroke but also their family and other caregivers. How can this condition affect me? A stroke is a medical emergency and should be treated right away. A stroke can lead to brain damage and can sometimes be life-threatening. If a person gets medical treatment right away, there is a better chance of surviving and recovering from a stroke. What can increase my risk? The following medical conditions may increase your risk of a stroke: Cardiovascular disease. High blood pressure (hypertension). Diabetes. High cholesterol. Sickle cell disease. Blood clotting disorders (hypercoagulable state). Obesity. Sleep disorders (obstructive sleep apnea). Other risk factors include: Being older than age 31. Having a history of blood  clots, stroke, or mini-stroke (transient ischemic attack, TIA). Genetic factors, such as race, ethnicity, or a family history of stroke. Smoking cigarettes or using other tobacco products. Taking birth control pills, especially if you also use tobacco. Heavy use of alcohol or drugs, especially cocaine and methamphetamine. Physical inactivity. What actions can I take to prevent this? Manage your health conditions High cholesterol levels. Eating a healthy diet is important for preventing high cholesterol. If cholesterol cannot be managed through diet alone, you may need to take medicines. Take any prescribed medicines to control your cholesterol as told by your health care provider. Hypertension. To reduce your risk of stroke, try to keep your blood pressure below 130/80. Eating a healthy diet and exercising regularly are important for controlling blood pressure. If these steps are not enough to manage your blood pressure, you may need to take medicines. Take any prescribed medicines to control hypertension as told by your health care provider. Ask your health care provider if you should monitor your blood pressure at home. Have your blood pressure checked every year, even if your blood pressure is normal. Blood pressure increases with age and some medical conditions. Diabetes. Eating a healthy diet and exercising regularly are important parts of managing your blood sugar (glucose). If your blood sugar cannot be managed through diet and exercise, you may need to take medicines. Take any prescribed medicines to control your diabetes as told by your health care provider. Get evaluated for obstructive sleep apnea. Talk to your health care provider about getting a sleep evaluation if you snore a lot or have excessive sleepiness. Make sure that any other medical conditions you have, such as atrial fibrillation or atherosclerosis, are managed. Nutrition Follow instructions from your health care  provider about  what to eat or drink to help manage your health condition. These instructions may include: Reducing your daily calorie intake. Limiting how much salt (sodium) you use to 1,500 milligrams (mg) each day. Using only healthy fats for cooking, such as olive oil, canola oil, or sunflower oil. Eating healthy foods. You can do this by: Choosing foods that are high in fiber, such as whole grains, and fresh fruits and vegetables. Eating at least 5 servings of fruits and vegetables a day. Try to fill one-half of your plate with fruits and vegetables at each meal. Choosing lean protein foods, such as lean cuts of meat, poultry without skin, fish, tofu, beans, and nuts. Eating low-fat dairy products. Avoiding foods that are high in sodium. This can help lower blood pressure. Avoiding foods that have saturated fat, trans fat, and cholesterol. This can help prevent high cholesterol. Avoiding processed and prepared foods. Counting your daily carbohydrate intake.  Lifestyle If you drink alcohol: Limit how much you have to: 0-1 drink a day for women who are not pregnant. 0-2 drinks a day for men. Know how much alcohol is in your drink. In the [place] one drink equals one 12 oz bottle of beer (369mL), one 5 oz glass of wine (126mL), or one 1 oz glass of hard liquor (15mL). Do not use any products that contain nicotine or tobacco. These products include cigarettes, chewing tobacco, and vaping devices, such as e-cigarettes. If you need help quitting, ask your health care provider. Avoid secondhand smoke. Do not use drugs. Activity  Try to stay at a healthy weight. Get at least 30 minutes of exercise on most days, such as: Fast walking. Biking. Swimming. Medicines Take over-the-counter and prescription medicines only as told by your health care provider. Aspirin or blood thinners (antiplatelets or anticoagulants) may be recommended to reduce your risk of forming blood clots that can lead to  stroke. Avoid taking birth control pills. Talk to your health care provider about the risks of taking birth control pills if: You are over 49 years old. You smoke. You get very bad headaches. You have had a blood clot. Where to find more information American Stroke Association: www.strokeassociation.org Get help right away if: You or a loved one has any symptoms of a stroke. "BE FAST" is an easy way to remember the main warning signs of a stroke: B - Balance. Signs are dizziness, sudden trouble walking, or loss of balance. E - Eyes. Signs are trouble seeing or a sudden change in vision. F - Face. Signs are sudden weakness or numbness of the face, or the face or eyelid drooping on one side. A - Arms. Signs are weakness or numbness in an arm. This happens suddenly and usually on one side of the body. S - Speech. Signs are sudden trouble speaking, slurred speech, or trouble understanding what people say. T - Time. Time to call emergency services. Write down what time symptoms started. You or a loved one has other signs of a stroke, such as: A sudden, severe headache with no known cause. Nausea or vomiting. Seizure. These symptoms may represent a serious problem that is an emergency. Do not wait to see if the symptoms will go away. Get medical help right away. Call your local emergency services (911 in the [place] Do not drive yourself to the hospital. Summary You can help to prevent a stroke by eating healthy, exercising, not smoking, limiting alcohol intake, and managing any medical conditions you may have. Do not use any  products that contain nicotine or tobacco. These include cigarettes, chewing tobacco, and vaping devices, such as e-cigarettes. If you need help quitting, ask your health care provider. Remember "BE FAST" for warning signs of a stroke. Get help right away if you or a loved one has any of these signs. This information is not intended to replace advice given to you by your  health care provider. Make sure you discuss any questions you have with your health care provider. Document Revised: 10/11/2019 Document Reviewed: 10/11/2019 Elsevier Patient Education  [place].

## 2021-05-17 NOTE — Progress Notes (Signed)
[facility] [address] [name]. [place] [address] [phone]       OFFICE CONSULT NOTE  Ms. Teresa [name] [facility] Date of Birth:  11/15/1940 Medical Record Number:  [id number]   Referring MD: Alvin Critchley  Reason for Referral: Stroke  HPI: Teresa [name] is a 81 year old lady seen today for initial office consultation visit for stroke.  She is accompanied by her husband.  History is obtained from them and review of electronic medical records and opossum reviewed available pertinent imaging films in PACS.  She has past medical history of Raynaud's disease, hypothyroidism and skin cancer.  She presented on 02/26/2021 to [facility] for sudden onset of dysarthria and left-sided weakness.  She she was fine till 820 in the morning that day when she went for a walk with her neighbor.  She was brought in as a code stroke on initial evaluation by neurologist Dr. Quinn Axe patient had some inconsistent features on exam with some left-sided weakness but some giveaway weakness which could be overcome with coaching and exam was fluctuating due to inconsistency with exam thrombolysis and was not done but urgent MRI scan of the brain was obtained which actually did not show an acute stroke.  Subsequently a CT angiogram was obtained which showed a right M2 branch occlusion and at that time the CT scan showed a low-density in the right frontal and subinsular region consistent with acute stroke.  Echocardiogram showed ejection fraction of 65 to 70% without cardiac source of embolism.  Hemoglobin A1c of 5.4.  LDL cholesterol is 83.7.  Urine urine drug screen was negative.  Plan was to do outpatient cardiac monitoring in the discharge summary however it seems to not have happened so far.  Patient was transferred from [facility] to inpatient rehab at [facility], [place] where she is was there for a few weeks and is now being at home.  She is finished home physical and occupational therapy and plans to start outpatient therapy soon.  She  is now able to walk and uses a cane.  Left leg is still heavy and drags.  Speech has recovered back to baseline.  She is currently on Plavix which is tolerating well without bruising or bleeding.  Her blood pressure she states is quite well controlled at home though it is elevated slightly in my office today at 149/68.  She remains on Crestor which is tolerating well without muscle aches and pains.  She has no other complaints today.  ROS:   14 system review of systems is positive for weakness, difficulty walking all other systems negative  PMH:  Past Medical History:  Diagnosis Date   Cancer ([place])    skin   Hypothyroidism    Raynaud's disease     Social History:  Social History   Socioeconomic History   Marital status: Married    Spouse name: Teresa [name]   Number of children: Not on file   Years of education: Not on file   Highest education level: Not on file  Occupational History   Not on file  Tobacco Use   Smoking status: Never   Smokeless tobacco: Never  Vaping Use   Vaping Use: Never used  Substance and Sexual Activity   Alcohol use: Never   Drug use: Never   Sexual activity: Not on file  Other Topics Concern   Not on file  Social History Narrative   Not on file   Social Determinants of Health   Financial Resource Strain: Not on file  Food Insecurity: Not on file  Transportation Needs: Not on file  Physical Activity: Not on file  Stress: Not on file  Social Connections: Not on file  Intimate Partner Violence: Not on file    Medications:   Current Outpatient Medications on File Prior to Visit  Medication Sig Dispense Refill   Acetylcysteine (NAC) 600 MG CAPS Take 1 capsule (600 mg total) by mouth 2 (two) times daily. 60 capsule 0   Calcium Carb-Cholecalciferol 600-10 MG-MCG TABS Take 2 tablets by mouth daily. 60 tablet 0   clopidogrel (PLAVIX) 75 MG tablet Take 1 tablet (75 mg total) by mouth daily. 90 tablet 0   diclofenac Sodium (VOLTAREN) 1 % GEL  Apply 2 g topically 4 (four) times daily. 200 g 0   gabapentin (NEURONTIN) 300 MG capsule Take 1 capsule (300 mg total) by mouth at bedtime. 30 capsule 0   levothyroxine (SYNTHROID) 100 MCG tablet Take 1 tablet (100 mcg total) by mouth daily before breakfast. 30 tablet 0   magnesium oxide (MAG-OX) 400 MG tablet Take 1 tablet (400 mg total) by mouth daily. 30 tablet 0   Multiple Vitamins-Minerals (MULTIVITAMIN WITH MINERALS) tablet Take 1 tablet by mouth daily.     nisoldipine (SULAR) 34 MG 24 hr tablet Take 34 mg by mouth daily.     omega-3 acid ethyl esters (LOVAZA) 1 g capsule Take 1 capsule (1,000 mg total) by mouth daily. 30 capsule 0   rosuvastatin (CRESTOR) 10 MG tablet Take 1 tablet (10 mg total) by mouth at bedtime. 30 tablet 0   Vitamin D3 (VITAMIN D) 25 MCG tablet Take 1 tablet (1,000 Units total) by mouth daily. 30 tablet 0   No current facility-administered medications on file prior to visit.    Allergies:   Allergies  Allergen Reactions   Codeine Itching and Rash    Other reaction(s): Vomiting   Misc. Sulfonamide Containing Compounds Rash   Penicillins Itching, Rash and Swelling   Sulfa Antibiotics Hives, Itching and Rash    Physical Exam General: well developed, well nourished, elderly  lady seated, in no evident distress Head: head normocephalic and atraumatic.   Neck: supple with no carotid or supraclavicular bruits Cardiovascular: regular rate and rhythm, no murmurs Musculoskeletal: no deformity Skin:  no rash/petichiae Vascular:  Normal pulses all extremities  Neurologic Exam Mental Status: Awake and fully alert. Oriented to place and time. Recent and remote memory intact. Attention span, concentration and fund of knowledge appropriate. Mood and affect appropriate.  Cranial Nerves: Fundoscopic exam reveals sharp disc margins. Pupils equal, briskly reactive to light. Extraocular movements full without nystagmus. Visual fields full to confrontation. Hearing intact.  Facial sensation intact.  Mild left lower facial weakness., tongue, palate moves normally and symmetrically.  Tongue deviates slightly to the left upon protrusion but has full range of movement. 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  Orbits right over left upper extremity.  Mild weakness of left elbow extension and trace weakness of left hip flexion and ankle dorsiflexion.  Tone is slightly increased on the left compared to the right.. Sensory.: intact to touch , pinprick , position and vibratory sensation.  Coordination: Rapid alternating movements normal in all extremities. Finger-to-nose and heel-to-shin performed accurately bilaterally. Gait and Station: Arises from chair without difficulty. Stance is normal.  Uses a 4 pronged cane.  Gait demonstrates slight stiffness and dragging of the left leg.Gatha Mayer to heel, toe and tandem  walk without difficulty.  Reflexes: 2+ on the left and 1+ on the right.. Toes downgoing.   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  She is doing well but has mild residual left-sided weakness.  Vascular risk factors of hyperlipidemia and hypertension     PLAN:I had a long d/w patient and her husband about her recent cryptogenic stroke, risk for recurrent stroke/TIAs, personally independently reviewed imaging studies and stroke evaluation results and answered questions.Continue Plavix (clopidogrel ) 75 mg daily  for secondary stroke prevention and maintain strict control of hypertension with blood pressure goal below 130/90, diabetes with hemoglobin A1c goal below 6.5% and lipids with LDL cholesterol goal below 70 mg/dL. I also advised the patient to eat a healthy diet with plenty of whole grains, cereals, fruits and vegetables, exercise regularly and maintain ideal body  weight . Refer for outpatient loop recorder for PAF. Patient prefers it being done in [place].Followup in the future with my NP in 3 months.  Greater than 50% time during this 50-minute consultation visit was spent on counseling and coordination of care about her left hemiparesis and cryptogenic stroke and need for further evaluation including loop recorder and answering questions. Antony Contras, MD  Note: This document was prepared with digital dictation and possible smart phrase technology. Any transcriptional errors that result from this process are unintentional.

## 2021-05-24 ENCOUNTER — Encounter: Payer: Self-pay | Admitting: Physical Therapy

## 2021-05-24 ENCOUNTER — Other Ambulatory Visit: Payer: Self-pay

## 2021-05-24 ENCOUNTER — Encounter: Payer: Self-pay | Admitting: Occupational Therapy

## 2021-05-24 ENCOUNTER — Ambulatory Visit: Payer: Medicare PPO

## 2021-05-24 ENCOUNTER — Ambulatory Visit: Payer: Medicare PPO | Attending: Internal Medicine | Admitting: Occupational Therapy

## 2021-05-24 DIAGNOSIS — R278 Other lack of coordination: Secondary | ICD-10-CM | POA: Diagnosis present

## 2021-05-24 DIAGNOSIS — I63511 Cerebral infarction due to unspecified occlusion or stenosis of right middle cerebral artery: Secondary | ICD-10-CM

## 2021-05-24 DIAGNOSIS — R482 Apraxia: Secondary | ICD-10-CM | POA: Insufficient documentation

## 2021-05-24 DIAGNOSIS — R269 Unspecified abnormalities of gait and mobility: Secondary | ICD-10-CM

## 2021-05-24 DIAGNOSIS — M6281 Muscle weakness (generalized): Secondary | ICD-10-CM | POA: Diagnosis present

## 2021-05-24 DIAGNOSIS — R2681 Unsteadiness on feet: Secondary | ICD-10-CM | POA: Insufficient documentation

## 2021-05-24 DIAGNOSIS — M25512 Pain in left shoulder: Secondary | ICD-10-CM | POA: Diagnosis present

## 2021-05-24 DIAGNOSIS — G8929 Other chronic pain: Secondary | ICD-10-CM | POA: Diagnosis present

## 2021-05-24 DIAGNOSIS — R262 Difficulty in walking, not elsewhere classified: Secondary | ICD-10-CM | POA: Insufficient documentation

## 2021-05-24 DIAGNOSIS — R2689 Other abnormalities of gait and mobility: Secondary | ICD-10-CM | POA: Insufficient documentation

## 2021-05-24 NOTE — Therapy (Signed)
[place] MAIN [facility] SERVICES [address] [place], [place], [address] Phone: [phone]   Fax:  [phone]  Physical Therapy Evaluation  Patient Details  Name: Teresa [name] MRN: [id number] Date of Birth: [birth year] Referring Provider (PT): [name], MD   Encounter Date: 05/24/2021   PT End of Session - 05/24/21 0938     Visit Number 1    Number of Visits 25    Date for PT Re-Evaluation 08/16/21    PT Start Time 1000    PT Stop Time 1102    PT Time Calculation (min) 62 min    Equipment Utilized During Treatment Gait belt    Activity Tolerance Patient tolerated treatment well    Behavior During Therapy [facility] for tasks assessed/performed             Past Medical History:  Diagnosis Date   Cancer ([place])    skin   Hypothyroidism    Raynaud's disease     Past Surgical History:  Procedure Laterality Date   [place]   BACK SURGERY  2010   Cervical fusion C4-5-6-7   CATARACT EXTRACTION, BILATERAL Bilateral 10/2016   CHOLECYSTECTOMY  1995   COLONOSCOPY WITH PROPOFOL N/A 12/08/2018   Procedure: COLONOSCOPY WITH PROPOFOL;  Surgeon: Toledo, Benay Pike, MD;  Location: [facility] ENDOSCOPY;  Service: Gastroenterology;  Laterality: N/A;   EYE SURGERY      There were no vitals filed for this visit.    Subjective Assessment - 05/24/21 0928     Subjective Pt is a 81 y.o. 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 Pt completed CIR from 03/02/21-03/22/21. PMH includes: skin cancer, hypothyroidism, and Raynaud's disease, history of cervical fusion in 2010 C4-C7.    Pertinent History Pt is a 82 y.o. 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 Pt completed CIR from 03/02/21-03/22/21. PMH includes: skin cancer, hypothyroidism, and Raynaud's disease, history of cervical fusion in 2010 C4-C7.  Pt reports HH PT/OT/SLP was performed from January 2nd to February 28th  with focus on ADL completion, ambulation. Pt's big goal tranisitioning to ambulating up to 3 miles a day which pt was doing prior to stroke. Pt has been ambulating in [employer] with husband but last day or two pt has noticed increased L foot drag but states it is better today. Pt denies any falls at home or community. Pt reports balance deficits with turns primarily. States her balance is good with her quad cane. Wishing to return to full independence in ambulation with household and community ambulation. Wishing to be able to asc/desc full flight of stairs with ability to carry decorative items and other household items without need for handrail and/or AD.    Limitations Lifting;Standing;Walking;House hold activities    How long can you sit comfortably? unlimited    How long can you stand comfortably? unlimited    How long can you walk comfortably? ~ 1.5 hours    Patient Stated Goals Return to independent walking for 3 miles. Return to to crochet.    Currently in Pain? No/denies   Does report LUE pain up to 3-4/10 on average               [facility] PT Assessment - 05/24/21 0931       Assessment   Medical Diagnosis R sided MCA CVA    Referring Provider (PT) [name], MD  Onset Date/Surgical Date 02/26/21    Next MD Visit June 21, 2021    Prior Therapy Yes   CIR from 03/02/21-03/22/21     Precautions   Precautions Fall      Restrictions   Weight Bearing Restrictions No      Balance Screen   Has the patient fallen in the past 6 months No    Has the patient had a decrease in activity level because of a fear of falling?  No    Is the patient reluctant to leave their home because of a fear of falling?  No      Home Ecologist residence    Living Arrangements Spouse/significant other    Available Help at Discharge Family    Type of [place] to enter    Entrance Stairs-Number of Steps 1    Entrance Stairs-Rails None    Home  Layout Two level    Alternate Level Stairs-Number of Steps full flight    Alternate Level Stairs-Rails [place] - quad;Walker - 2 wheels;Shower seat;Grab bars - tub/shower;Grab bars - toilet   Handicapped toilet     Prior Function   Level of Independence Independent   Prior to CVA and currently   Vocation Retired    [employer] Retired    Teacher, music and walking      Cognition   Overall Cognitive Status Within Functional Limits for tasks assessed      Functional Gait  Assessment   Gait assessed  Yes    Gait Level Surface Walks 20 ft in less than 7 sec but greater than 5.5 sec, uses assistive device, slower speed, mild gait deviations, or deviates 6-10 in outside of the 12 in walkway width.    Change in Gait Speed Makes only minor adjustments to walking speed, or accomplishes a change in speed with significant gait deviations, deviates 10-15 in outside the 12 in walkway width, or changes speed but loses balance but is able to recover and continue walking.    Gait with Horizontal Head Turns Performs head turns smoothly with slight change in gait velocity (eg, minor disruption to smooth gait path), deviates 6-10 in outside 12 in walkway width, or uses an assistive device.    Gait with Vertical Head Turns Performs task with slight change in gait velocity (eg, minor disruption to smooth gait path), deviates 6 - 10 in outside 12 in walkway width or uses assistive device    Gait and Pivot Turn Pivot turns safely in greater than 3 sec and stops with no loss of balance, or pivot turns safely within 3 sec and stops with mild imbalance, requires small steps to catch balance.    Step Over Obstacle Is able to step over one shoe box (4.5 in total height) but must slow down and adjust steps to clear box safely. May require verbal cueing.    Gait with Narrow Base of Support Ambulates 4-7 steps.    Gait with Eyes Closed Walks 20 ft, uses assistive device, slower speed, mild  gait deviations, deviates 6-10 in outside 12 in walkway width. Ambulates 20 ft in less than 9 sec but greater than 7 sec.    Ambulating Backwards Walks 20 ft, uses assistive device, slower speed, mild gait deviations, deviates 6-10 in outside 12 in walkway width.    Steps Alternating feet, must use rail.    Total Score 17  OBJECTIVE  Musculoskeletal Tremor: Absent Bulk: Normal Tone: Normal, no clonus   Posture No gross abnormalities noted in standing or seated posture  Gait Pt presents with decreased stance time and decreased foot clerance on LLE. Limited hip extension bilaterally noted during toe off.    Strength R/L 4+/4 Hip flexion 4/4 Hip external rotation 3/3+ Hip internal rotation 5/5 Hip extension  4/4 Hip abduction 5/5 Hip adduction 5/5 Knee extension 5/5 Knee flexion 5/5 Ankle Plantarflexion 4/4 Ankle Dorsiflexion   NEUROLOGICAL:  Mental Status Patient's fund of knowledge is within normal limits for educational level.    Sensation Grossly intact to light touch bilateral UEs/LEs as determined by testing dermatomes C2-T2/L2-S2 respectively. Proprioception and hot/cold testing deferred on this date    Coordination/Cerebellar Heel to Shin: WNL Rapid alternating movements: WNL    FUNCTIONAL OUTCOME MEASURES   Results Comments  BERG NT Fall risk, in need of intervention  DGI NT   FGA 17/30   TUG NT   5TSTS 17.51 seconds At increased risk for falls  6 Minute Walk Test 765' with no AD Increased risk of falls for community distances. Cut off of 1,200'.  10 Meter Gait Speed Self-selected with out AD: 15.78 s = 0.63 m/s; Self selected with AD: 16.84s = .59  m/s Below normative values for full community ambulation           Stairs:  Pt relies on RUE support on railing and step to pattern with ascending/descending.    Objective measurements completed on examination: See above findings.    PT Education - 05/24/21 0932     Education  Details POC    Person(s) Educated Patient    Methods Explanation;Demonstration;Tactile cues;Verbal cues;Handout    Comprehension Verbalized understanding;Returned demonstration;Need further instruction              PT Short Term Goals - 05/24/21 0932       PT SHORT TERM GOAL #1   Title Pt will be indep with HEP to improve balance, strength, and gait to optimize independence with ADL completion    Baseline 05/24/21: Initiated    Time 6    Period Weeks    Status New    Target Date 07/05/21               PT Long Term Goals - 05/24/21 0933       PT LONG TERM GOAL #1   Title Pt will improve FOTO to target score to demonstrate clinically significant improvement in functional mobility    Baseline 05/24/21: next session    Time 12    Period Weeks    Status New    Target Date 08/02/21      PT LONG TERM GOAL #2   Title Pt will improve 5xSTS to 12 sec or less to indicate clinically significant improvement in LE strength    Baseline 05/24/21: 17.51 sec    Time 12    Period Weeks    Status New    Target Date 08/16/21      PT LONG TERM GOAL #3   Title Pt will improve 6 MWT by 165' to indicate clinically significant improvement in community ambulation distance for geriatric stroke norms.    Baseline 05/24/21: 765'    Time 12    Period Weeks    Status New    Target Date 08/16/21      PT LONG TERM GOAL #4   Title Pt will improve 10 m gait speed to at least  1.0 m/s to demonstrate reduced risk of falls with community ambulation tasks.    Baseline 05/24/21: .63 m/s without AD    Time 12    Period Weeks    Status New    Target Date 08/16/21      PT LONG TERM GOAL #5   Title Pt will improve FGA  by at least 5 point to demonstrate clinically signifcant reduced risk of falls with dynamic balance/ambulation tasks.    Baseline 05/24/21: 17    Time 12    Period Weeks    Status New    Target Date 08/16/21                    Plan - 05/24/21 1610     Clinical Impression  Statement Pt is a pleasant 81 year-old female referred to OPPT for R sided MCA CVA on 02/26/21 with L sided deficits. PT examination reveals deficits in LLE muscle strength via MMT > RLE, decreased 5xSTS below age matched norms, decreased gait speed and 6MWT, and gait mechanics. These deficits in strength, gait and balance increase pt's risk of falls with household and community ambulation. Pt will benefit from skilled PT services to address these deficits, improve balance, and decrease risk for future falls to optimize return to PLOF.    Personal Factors and Comorbidities Age;Time since onset of injury/illness/exacerbation;Comorbidity 3+;Past/Current Experience    Examination-Activity Limitations Squat;Stairs;Locomotion Level;Stand    Examination-Participation Restrictions Shop;Community Activity;Yard Work    [employer]chant [employer] officer Evolving/Moderate complexity    Clinical Decision Making Moderate    Rehab Potential Good    PT Frequency 2x / week    PT Duration 12 weeks    PT Treatment/Interventions ADLs/Self Care Home Management;Canalith Repostioning;Electrical Stimulation;DME Instruction;Gait training;Stair training;Functional mobility training;Therapeutic activities;Patient/family education;Therapeutic exercise;Balance training;Neuromuscular re-education;Energy conservation;Vestibular;Passive range of motion    PT Next Visit Plan FOTO    PT Home Exercise Plan Seated L ankle DF    Consulted and Agree with Plan of Care Patient             Patient will benefit from skilled therapeutic intervention in order to improve the following deficits and impairments:  Abnormal gait, Pain, Decreased mobility, Decreased activity tolerance, Decreased endurance, Decreased strength, Decreased balance, Difficulty walking  Visit Diagnosis: Right middle cerebral artery stroke (HCC)  Abnormality of gait and mobility  Difficulty in walking, not elsewhere classified  Muscle weakness  (generalized)     Problem List Patient Active Problem List   Diagnosis Date Noted   Right middle cerebral artery stroke ([place]) 03/01/2021   Stroke ([place]) 02/27/2021   History of nonmelanoma skin cancer 05/23/2014    Salem Caster. Fairly IV, PT, DPT Physical Therapist- [facility]  05/24/2021, 3:46 PM  [place] MAIN [facility] SERVICES [address] [place], [place], [address] Phone: [phone]   Fax:  [phone]  Name: Teresa [name] MRN: [id number] Date of Birth: 09/17/1940

## 2021-05-26 ENCOUNTER — Encounter: Payer: Self-pay | Admitting: Occupational Therapy

## 2021-05-26 NOTE — Therapy (Signed)
[place] [facility] MAIN [facility] SERVICES [address] [place], [place], [address] Phone: [phone]   Fax:  [phone]  Occupational Therapy Evaluation  Patient Details  Name: Teresa [name] MRN: [id number] Date of Birth: Jul 22, 1940 No data recorded  Encounter Date: 05/24/2021   OT End of Session - 05/26/21 1016     Visit Number 1    Number of Visits 24    Date for OT Re-Evaluation 08/18/21    OT Start Time 0900    OT Stop Time 1000    OT Time Calculation (min) 60 min    Activity Tolerance Patient tolerated treatment well    Behavior During Therapy [facility] for tasks assessed/performed             Past Medical History:  Diagnosis Date   Cancer (HCC)    skin   Hypothyroidism    Raynaud's disease     Past Surgical History:  Procedure Laterality Date   ABDOMINAL HYSTERECTOMY  1991   APPENDECTOMY  1991   BACK SURGERY  2010   Cervical fusion C4-5-6-7   CATARACT EXTRACTION, BILATERAL Bilateral 10/2016   CHOLECYSTECTOMY  1995   COLONOSCOPY WITH PROPOFOL N/A 12/08/2018   Procedure: COLONOSCOPY WITH PROPOFOL;  Surgeon: Toledo, Boykin Nearing, MD;  Location: [facility] ENDOSCOPY;  Service: Gastroenterology;  Laterality: N/A;   EYE SURGERY      There were no vitals filed for this visit.   Subjective Assessment - 05/26/21 1012     Subjective  Pt reports she was going on a 3 mile walk with a friend and when she got to her friend's house, she started to slur her speech and stumbled into the house.  Her friend called 911.    Pertinent History February 26, 2021, pt reports she had a CVA, came to [facility] to ER and then was transferred to [facility] in [place] where she was admitted and after acute care she went to inpatient rehab.  Following inpt rehab, pt went home and had home health.    Patient Stated Goals Pt reports her goal is to get back to normal, be as independent as possible.    Currently in Pain? Yes    Pain Score 6     Pain Location  Shoulder    Pain Orientation Left    Pain Descriptors / Indicators Aching;Discomfort    Pain Type Chronic pain    Pain Onset More than a month ago    Pain Frequency Intermittent    Multiple Pain Sites No               [facility] OT Assessment - 05/26/21 1120       Assessment   Medical Diagnosis R sided MCA CVA    Onset Date/Surgical Date 02/26/21    Hand Dominance Right    Next MD Visit June 21, 2021    Prior Therapy Yes   CIR from 03/02/21-03/22/21     Precautions   Precautions Fall      Restrictions   Weight Bearing Restrictions No      Prior Function   Level of Independence Independent   Prior to CVA and currently   Vocation Retired    [employer] Retired    Nurse, adult and walking      ADL   Eating/Feeding Needs assist with cutting food    Grooming Minimal assistance    Upper Body Bathing Minimal assistance    Lower Body Bathing Increased time  Upper Body Dressing Minimal assistance;Increased time    Lower Body Dressing Increased time;Needs assist for fasteners;Minimal assistance    Toilet Transfer Modified independent    Toileting - Clothing Manipulation Increased time    Toileting -  Hygiene Increase time    Tub/Shower Transfer Supervision/safety      IADL   Prior Level of Function Shopping independent    Shopping Needs to be accompanied on any shopping trip    Prior Level of Function Light Housekeeping independent    Light Housekeeping Needs help with all home maintenance tasks    Prior Level of Function Meal Prep independent    Meal Prep Able to complete simple cold meal and snack prep    Prior Level of Function Community Mobility independent    Community Mobility Relies on family or friends for transportation    Prior Level of Function Medication Managment independent    Medication Management Is responsible for taking medication in correct dosages at correct time    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 independently (budgets, writes checks, pays rent, bills goes to bank), collects and keeps track of income      Mobility   Mobility Status Needs assist    Mobility Status Comments quad cane and sometimes uses hemi walker      Written Expression   Dominant Hand Right      Vision - History   Additional Comments pt reported changes in her vision in her left eye, difficulty reading with left eye      Cognition   Overall Cognitive Status Within Functional Limits for tasks assessed      Sensation   Light Touch Appears Intact    Hot/Cold Appears Intact    Additional Comments reports occasional tingling into the fingers      Coordination   Right 9 Hole Peg Test 31    Left 9 Hole Peg Test unable      Edema   Edema Pt with moderate edema present in left hand      AROM   Overall AROM  Deficits    Overall AROM Comments RUE within normal limits, LUE shoulder flexion  to 47 degrees, AB 50, elbow flexion 130, extension -32 but has full PROM of elbow, supination to neutral, wrist flex 30, extension 10.      Strength   Overall Strength Deficits    Overall Strength Comments RUE 5/5, LUE-NT pt without full ROM      Left Hand AROM   L Thumb Opposition to Index --   gross opposition to index finger only at eval   L Index  MCP 0-90 55 Degrees   extension -30   L Index PIP 0-100 60 Degrees   extension -20   L Long  MCP 0-90 50 Degrees   extension -30   L Long PIP 0-100 55 Degrees   extension -30   L Ring  MCP 0-90 50 Degrees   extension -15   L Ring PIP 0-100 70 Degrees   extension -30   L Little  MCP 0-90 45 Degrees   extension -10   L Little PIP 0-100 70 Degrees   extension -15     Hand Function   Right Hand Grip (lbs) 50    Left Hand Grip (lbs) 0            Pt seen for manual skills with mobs to left hand at carpals and metacarpals  for spreads to decrease edema, retrograde massage techniques applied to decrease edema with education on massage and  positioning for home.  Pt assessed and fitted for isotoner compression glove to left hand for edema control.  Pt required min to mod assist to don glove.  Able to doff glove. Instructed on use for home.    Reassess edema next session.                   OT Education - 05/26/21 1016     Education Details Role of OT, goals, plan of care    Person(s) Educated Patient    Methods Explanation    Comprehension Verbalized understanding                 OT Long Term Goals - 05/26/21 1730       OT LONG TERM GOAL #1   Title Pt will be independent with home exercise program.    Baseline no current program    Time 12    Period Weeks    Status New    Target Date 08/18/21      OT LONG TERM GOAL #2   Title Pt will complete UB and LB dressing with modified independence including buttons, snaps and zippers.    Baseline requires min assist at eval    Time 12    Period Weeks    Status New    Target Date 08/18/21      OT LONG TERM GOAL #3   Title Pt will perform shower transfer with modified independence.    Baseline Pt requires supervision to min assist for shower transfer at home.    Time 6    Period Weeks    Status New    Target Date 07/07/21      OT LONG TERM GOAL #4   Title Pt will improve L hand grip by 10# to assist with holding items in left hand securely.    Baseline no grip in left hand at eval    Time 12    Period Weeks    Status New    Target Date 08/18/21      OT LONG TERM GOAL #5   Title Pt will improve left shoulder flexion by 10 degrees to improve reaching to obtain self care items from shelf/shoulder height.    Baseline difficulty with reach, shoulder flexion to 47 degrees.    Time 12    Period Weeks    Status New    Target Date 07/07/21      Long Term Additional Goals   Additional Long Term Goals Yes      OT LONG TERM GOAL #6   Title Pt will improve FOTO score to 47 or above to demonstrate a clinically relevant change in function to impact  ADL tasks.    Baseline score of 30 at eval    Time 12    Period Weeks    Status New    Target Date 08/18/21      OT LONG TERM GOAL #7   Title Pt will demonstrate ability to pick up small objects and complete 9 hole peg test in less than 2 mins.    Baseline unable to perform at eval    Time 12    Period Weeks    Status New    Target Date 08/18/21                   Plan - 05/26/21 1017  Clinical Impression Statement Pt is 81 yo female who suffered a CVA in Dec 2022, referred to OP OT for evaluation.  Pt presents with left hemiplegia, left shoulder pain, decreased ROM, decreased balance, edema in left UE, decreased coordination and decreased ability to perform self care and IADL tasks.  Pt would benefit from skilled OT services to maximize safety and independence in necessary daily tasks.    OT Occupational Profile and History Detailed Assessment- Review of Records and additional review of physical, cognitive, psychosocial history related to current functional performance    Occupational performance deficits (Please refer to evaluation for details): ADL's;IADL's;Leisure;Rest and Sleep    Body Structure / Function / Physical Skills ADL;Coordination;Endurance;GMC;UE functional use;Balance;IADL;Pain;Dexterity;FMC;Strength;Edema;Mobility;ROM    Psychosocial Skills Environmental  Adaptations;Habits;Routines and Behaviors    Rehab Potential Good    Clinical Decision Making Several treatment options, min-mod task modification necessary    Comorbidities Affecting Occupational Performance: May have comorbidities impacting occupational performance    Modification or Assistance to Complete Evaluation  Min-Moderate modification of tasks or assist with assess necessary to complete eval    OT Frequency 2x / week    OT Duration 12 weeks    OT Treatment/Interventions Self-care/ADL training;Cryotherapy;Paraffin;Therapeutic exercise;DME and/or AE instruction;Functional Mobility Training;Balance  training;Electrical Stimulation;Ultrasound;Neuromuscular education;Manual Therapy;Splinting;Moist Heat;Contrast Bath;Passive range of motion;Therapeutic activities;Patient/family education;Coping strategies training    Consulted and Agree with Plan of Care Patient             Patient will benefit from skilled therapeutic intervention in order to improve the following deficits and impairments:   Body Structure / Function / Physical Skills: ADL, Coordination, Endurance, GMC, UE functional use, Balance, IADL, Pain, Dexterity, FMC, Strength, Edema, Mobility, ROM   Psychosocial Skills: Environmental  Adaptations, Habits, Routines and Behaviors   Visit Diagnosis: Muscle weakness (generalized)  Other lack of coordination  Chronic left shoulder pain    Problem List Patient Active Problem List   Diagnosis Date Noted   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 [place], [place], [address] Phone: [phone]   Fax:  [phone]  Name: Teresa [name] MRN: [id number] Date of Birth: [birth year]

## 2021-05-30 ENCOUNTER — Ambulatory Visit: Payer: Medicare PPO

## 2021-05-30 ENCOUNTER — Other Ambulatory Visit: Payer: Self-pay

## 2021-05-30 DIAGNOSIS — M6281 Muscle weakness (generalized): Secondary | ICD-10-CM

## 2021-05-30 DIAGNOSIS — R278 Other lack of coordination: Secondary | ICD-10-CM

## 2021-05-30 DIAGNOSIS — R269 Unspecified abnormalities of gait and mobility: Secondary | ICD-10-CM

## 2021-05-30 DIAGNOSIS — R482 Apraxia: Secondary | ICD-10-CM

## 2021-05-30 DIAGNOSIS — R262 Difficulty in walking, not elsewhere classified: Secondary | ICD-10-CM

## 2021-05-30 DIAGNOSIS — R2681 Unsteadiness on feet: Secondary | ICD-10-CM

## 2021-05-30 NOTE — Therapy (Signed)
[place] MAIN [facility] SERVICES [address] [place], [place], [address] Phone: [phone]   Fax:  [phone]  Occupational Therapy Treatment  Patient Details  Name: Teresa [name] MRN: [id number] Date of Birth: 1940-05-09 No data recorded  Encounter Date: 05/30/2021   OT End of Session - 05/30/21 1620     Visit Number 2    Number of Visits 24    Date for OT Re-Evaluation 08/18/21    OT Start Time 1015    OT Stop Time 1100    OT Time Calculation (min) 45 min    Activity Tolerance Patient tolerated treatment well    Behavior During Therapy [facility] for tasks assessed/performed             Past Medical History:  Diagnosis Date   Cancer ([place])    skin   Hypothyroidism    Raynaud's disease     Past Surgical History:  Procedure Laterality Date   [place]   BACK SURGERY  2010   Cervical fusion C4-5-6-7   CATARACT EXTRACTION, BILATERAL Bilateral 10/2016   CHOLECYSTECTOMY  1995   COLONOSCOPY WITH PROPOFOL N/A 12/08/2018   Procedure: COLONOSCOPY WITH PROPOFOL;  Surgeon: Toledo, Benay Pike, MD;  Location: [facility] ENDOSCOPY;  Service: Gastroenterology;  Laterality: N/A;   EYE SURGERY      There were no vitals filed for this visit.   Subjective Assessment - 05/30/21 1617     Subjective  Pt reports being eager to get back to "normal."    Pertinent History February 26, 2021, pt reports she had a CVA, came to [facility] to ER and then was transferred to [facility] in [place] where she was admitted and after acute care she went to inpatient rehab.  Following inpt rehab, pt went home and had home health.    Patient Stated Goals Pt reports her goal is to get back to normal, be as independent as possible.    Currently in Pain? Yes    Pain Score 9    only with activity, and pain is shooting/short lived   Pain Location Shoulder    Pain Orientation Left    Pain Descriptors / Indicators Aching;Shooting     Pain Type Chronic pain    Pain Radiating Towards shoulder to hand    Pain Onset More than a month ago    Pain Frequency Intermittent    Aggravating Factors  activity    Pain Relieving Factors rest, gentle ROM, heat    Effect of Pain on Daily Activities pt guards LUE during activity and transfers    Multiple Pain Sites No            Occupational Therapy Treatment: Manual Therapy: Performed manual L scapular gliding all planes in supine d/t L shoulder pain and limited mobility.  Performed gentle passive and prolonged stretching for L wrist flex/ext and digit flex/ext.  Instructed pt in self passive stretching techniques reinforcing slow/prolonged stretch for L wrist and hand with good return demo.  Performed edema massage to L hand and instructed pt in massage technique; pt verbalized understanding, but further review needed.  Pt has been wearing isotoner glove to L hand to manage edema.    Therapeutic Exercise: In supine, performed AAROM for L shoulder flex/abd/horiz abd/add/ER to top of head, elbow extension against gravity x10 each; min A-min guard to control movements and achieve end range within pain tolerance, though with repetition pt reported no  more pain.  Able to perform L elbow flexion with min resistance for 10 reps.  Performed gentle passive forearm pron/sup to tolerance.  In sitting, performed L shoulder IR with L hand to posterior hip x10 reps, min A from OT to prevent hand dragging on mat.  Instructed pt in scapular retraction with good return demo.    Response to Treatment: Pt was pleased to report she is now able to flex her elbow on her own, which she reports is new movement as of yesterday.  Pt reported initial 0/10 pain in L shoulder at rest, up to 9/10 pain when she tries to lift LUE at home.  Following manual scapular mobility and a few repetitions of each shoulder plane with active assist to achieve end range, pt reported no discomfort in L shoulder, though pt does  tend to still guard toward end range.  Initiated HEP this day with L shoulder AROM/AAROM all planes, and self passive stretching for L wrist and hand, reinforcing slow/prolonged stretch for wrist extension and digit flexion.  Further training needed.  Pt will continue to benefit from skilled OT to manage L shoulder pain, to increase LUE strength and coordination, and manage edema in L hand, working to increase engagement of LUE into daily tasks.      OT Education - 05/30/21 1619     Education Details LUE HEP including AROM and PROM    Person(s) Educated Patient    Methods Explanation    Comprehension Verbalized understanding;Returned demonstration;Verbal cues required;Need further instruction;Tactile cues required                 OT Long Term Goals - 05/26/21 1730       OT LONG TERM GOAL #1   Title Pt will be independent with home exercise program.    Baseline no current program    Time 12    Period Weeks    Status New    Target Date 08/18/21      OT LONG TERM GOAL #2   Title Pt will complete UB and LB dressing with modified independence including buttons, snaps and zippers.    Baseline requires min assist at eval    Time 12    Period Weeks    Status New    Target Date 08/18/21      OT LONG TERM GOAL #3   Title Pt will perform shower transfer with modified independence.    Baseline Pt requires supervision to min assist for shower transfer at home.    Time 6    Period Weeks    Status New    Target Date 07/07/21      OT LONG TERM GOAL #4   Title Pt will improve L hand grip by 10# to assist with holding items in left hand securely.    Baseline no grip in left hand at eval    Time 12    Period Weeks    Status New    Target Date 08/18/21      OT LONG TERM GOAL #5   Title Pt will improve left shoulder flexion by 10 degrees to improve reaching to obtain self care items from shelf/shoulder height.    Baseline difficulty with reach, shoulder flexion to 47 degrees.     Time 12    Period Weeks    Status New    Target Date 07/07/21      Long Term Additional Goals   Additional Long Term Goals Yes  OT LONG TERM GOAL #6   Title Pt will improve FOTO score to 47 or above to demonstrate a clinically relevant change in function to impact ADL tasks.    Baseline score of 30 at eval    Time 12    Period Weeks    Status New    Target Date 08/18/21      OT LONG TERM GOAL #7   Title Pt will demonstrate ability to pick up small objects and complete 9 hole peg test in less than 2 mins.    Baseline unable to perform at eval    Time 12    Period Weeks    Status New    Target Date 08/18/21              Plan - 05/30/21 1635     Clinical Impression Statement Pt was pleased to report she is now able to flex her elbow on her own, which she reports is new movement as of yesterday.  Pt reported initial 0/10 pain in L shoulder at rest, up to 9/10 pain when she tries to lift LUE at home.  Following manual scapular mobility and a few repetitions of each shoulder plane with active assist to achieve end range, pt reported no discomfort in L shoulder, though pt does tend to still guard toward end range.  Initiated HEP this day with L shoulder AROM/AAROM all planes, and self passive stretching for L wrist and hand, reinforcing slow/prolonged stretch for wrist extension and digit flexion.  Further training needed.  Pt will continue to benefit from skilled OT to manage L shoulder pain, to increase LUE strength and coordination, and manage edema in L hand, working to increase engagement of LUE into daily tasks.    OT Occupational Profile and History Detailed Assessment- Review of Records and additional review of physical, cognitive, psychosocial history related to current functional performance    Occupational performance deficits (Please refer to evaluation for details): ADL's;IADL's;Leisure;Rest and Sleep    Body Structure / Function / Physical Skills  ADL;Coordination;Endurance;GMC;UE functional use;Balance;IADL;Pain;Dexterity;FMC;Strength;Edema;Mobility;ROM    Psychosocial Skills Environmental  Adaptations;Habits;Routines and Behaviors    Rehab Potential Good    Clinical Decision Making Several treatment options, min-mod task modification necessary    Comorbidities Affecting Occupational Performance: May have comorbidities impacting occupational performance    Modification or Assistance to Complete Evaluation  Min-Moderate modification of tasks or assist with assess necessary to complete eval    OT Frequency 2x / week    OT Duration 12 weeks    OT Treatment/Interventions Self-care/ADL training;Cryotherapy;Paraffin;Therapeutic exercise;DME and/or AE instruction;Functional Mobility Training;Balance training;Electrical Stimulation;Ultrasound;Neuromuscular education;Manual Therapy;Splinting;Moist Heat;Contrast Bath;Passive range of motion;Therapeutic activities;Patient/family education;Coping strategies training    Consulted and Agree with Plan of Care Patient             Patient will benefit from skilled therapeutic intervention in order to improve the following deficits and impairments:   Body Structure / Function / Physical Skills: ADL, Coordination, Endurance, GMC, UE functional use, Balance, IADL, Pain, Dexterity, FMC, Strength, Edema, Mobility, ROM   Psychosocial Skills: Environmental  Adaptations, Habits, Routines and Behaviors   Visit Diagnosis: Apraxia  Muscle weakness (generalized)  Other lack of coordination    Problem List Patient Active Problem List   Diagnosis Date Noted   Right middle cerebral artery stroke ([place]) 03/01/2021   Stroke ([place]) 02/27/2021   History of nonmelanoma skin cancer 05/23/2014   Leta Speller, MS, OTR/L  Darleene Cleaver, OT 05/30/2021, 4:35 PM  [place] MAIN [facility] SERVICES [address] [place], [place], [address] Phone: [phone]   Fax:   [phone]  Name: Saveah Bahar MRN: [id number] Date of Birth: Apr 11, 1940

## 2021-05-30 NOTE — Therapy (Signed)
[place] MAIN [facility] SERVICES [address] [place], [place], [address] Phone: [phone]   Fax:  [phone]  Physical Therapy Treatment  Patient Details  Name: Teresa [name] MRN: [id number] Date of Birth: Aug 07, 1940 Referring Provider (PT): [name], MD   Encounter Date: 05/30/2021   PT End of Session - 05/30/21 1034     Visit Number 2    Number of Visits 25    Date for PT Re-Evaluation 08/16/21    PT Start Time 0930    PT Stop Time 6712    PT Time Calculation (min) 44 min    Equipment Utilized During Treatment Gait belt    Activity Tolerance Patient tolerated treatment well    Behavior During Therapy [facility] for tasks assessed/performed             Past Medical History:  Diagnosis Date   Cancer ([place])    skin   Hypothyroidism    Raynaud's disease     Past Surgical History:  Procedure Laterality Date   [place]   BACK SURGERY  2010   Cervical fusion C4-5-6-7   CATARACT EXTRACTION, BILATERAL Bilateral 10/2016   CHOLECYSTECTOMY  1995   COLONOSCOPY WITH PROPOFOL N/A 12/08/2018   Procedure: COLONOSCOPY WITH PROPOFOL;  Surgeon: Toledo, Benay Pike, MD;  Location: [facility] ENDOSCOPY;  Service: Gastroenterology;  Laterality: N/A;   EYE SURGERY      There were no vitals filed for this visit.   Subjective Assessment - 05/30/21 0937     Subjective Patient reports doing okay- Trying to walk in home using her quad cane. States her left arm is heavy and continues to swell.    Pertinent History Pt is a 81 y.o. 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 Pt completed CIR from 03/02/21-03/22/21. PMH includes: skin cancer, hypothyroidism, and Raynaud's disease, history of cervical fusion in 2010 C4-C7.  Pt reports HH PT/OT/SLP was performed from January 2nd to February 28th with focus on ADL completion, ambulation. Pt's big goal tranisitioning to ambulating up to 3 miles a day  which pt was doing prior to stroke. Pt has been ambulating in [employer] with husband but last day or two pt has noticed increased L foot drag but states it is better today. Pt denies any falls at home or community. Pt reports balance deficits with turns primarily. States her balance is good with her quad cane. Wishing to return to full independence in ambulation with household and community ambulation. Wishing to be able to asc/desc full flight of stairs with ability to carry decorative items and other household items without need for handrail and/or AD.    Limitations Lifting;Standing;Walking;House hold activities    How long can you sit comfortably? unlimited    How long can you stand comfortably? unlimited    How long can you walk comfortably? ~ 1.5 hours    Patient Stated Goals Return to independent walking for 3 miles. Return to to crochet.    Currently in Pain? Yes    Pain Location Hand   Heaviness feeling   Pain Orientation Left    Pain Descriptors / Indicators Aching    Pain Type Chronic pain    Pain Onset More than a month ago    Pain Frequency Intermittent              INTERVENTIONS:     Heel to toe sequencing with blue TB x  15 reps (mild noticeable fatigue with last few reps with decreased heel strike)   Single leg stance- 3-10 sec hold x left, then right LE x multiple attempts (increased difficulty with left vs. Right but much improved with practice- completing 10 sec)  Sit to stand - Staggered feet x 10 reps then switch feet position for 10 more witthout UE support  Step up with 1 leg with added hip march on opp (high knee toward bar) x 12 reps each Leg  Tandem standing- up to 20 sec each leg position x multiple attempts.  Gait in hallway- Approx 150 feet using no AD- While in hallway- performing horizontal head turns - calling out items on sticky notes positioned on walls- Mild gait deviation- slower pace - lateral drift to left  Education provided throughout session via  VC/TC and demonstration to facilitate movement at target joints and correct muscle activation for all testing and exercises performed.   Access Code: [email or web address] URL: [email or web address] Date: 05/30/2021 Prepared by: Sande Brothers  Exercises Single Leg Stance - 1 x daily - 3 x weekly - 3 sets - 5 reps - 5-10 sec hold Tandem Stance - 1 x daily - 3 x weekly - 3 sets - 5 reps - 20 sec hold Staggered Sit-to-Stand - 1 x daily - 3 x weekly - 2 sets - 10 reps                        PT Education - 05/30/21 1034     Education Details Exercise technique; edu in HEP-Access Code: [email or web address]  URL: [email or web address]    Person(s) Educated Patient    Methods Explanation;Demonstration;Tactile cues;Verbal cues;Handout    Comprehension Verbalized understanding;Returned demonstration;Verbal cues required;Tactile cues required;Need further instruction              PT Short Term Goals - 05/24/21 0932       PT SHORT TERM GOAL #1   Title Pt will be indep with HEP to improve balance, strength, and gait to optimize independence with ADL completion    Baseline 05/24/21: Initiated    Time 6    Period Weeks    Status New    Target Date 07/05/21               PT Long Term Goals - 05/24/21 0933       PT LONG TERM GOAL #1   Title Pt will improve FOTO to target score to demonstrate clinically significant improvement in functional mobility    Baseline 05/24/21: next session    Time 12    Period Weeks    Status New    Target Date 08/02/21      PT LONG TERM GOAL #2   Title Pt will improve 5xSTS to 12 sec or less to indicate clinically significant improvement in LE strength    Baseline 05/24/21: 17.51 sec    Time 12    Period Weeks    Status New    Target Date 08/16/21      PT LONG TERM GOAL #3   Title Pt will improve 6 MWT by 165' to indicate clinically significant improvement in community ambulation distance for geriatric stroke norms.     Baseline 05/24/21: 765'    Time 12    Period Weeks    Status New    Target Date 08/16/21      PT LONG TERM GOAL #4   Title Pt will improve 10 m gait speed to at  least 1.0 m/s to demonstrate reduced risk of falls with community ambulation tasks.    Baseline 05/24/21: .63 m/s without AD    Time 12    Period Weeks    Status New    Target Date 08/16/21      PT LONG TERM GOAL #5   Title Pt will improve FGA  by at least 5 point to demonstrate clinically signifcant reduced risk of falls with dynamic balance/ambulation tasks.    Baseline 05/24/21: 17    Time 12    Period Weeks    Status New    Target Date 08/16/21                   Plan - 05/30/21 1034     Clinical Impression Statement Patient presented with good motivation for today's session. She responded well to all VC and visual demo and able to return demo of all prescribed activity safely. She exhibited improvement with SLS and tandem standing with practice. Patient will benefit from continued PT services to improve her functional strength and mobility to resume her previous level of function with decreased risk of falling.    Personal Factors and Comorbidities Age;Time since onset of injury/illness/exacerbation;Comorbidity 3+;Past/Current Experience    Examination-Activity Limitations Squat;Stairs;Locomotion Level;Stand    Examination-Participation Restrictions Shop;Community Activity;Yard Work    [employer] Evolving/Moderate complexity    Rehab Potential Good    PT Frequency 2x / week    PT Duration 12 weeks    PT Treatment/Interventions ADLs/Self Care Home Management;Canalith Repostioning;Electrical Stimulation;DME Instruction;Gait training;Stair training;Functional mobility training;Therapeutic activities;Patient/family education;Therapeutic exercise;Balance training;Neuromuscular re-education;Energy conservation;Vestibular;Passive range of motion    PT Next Visit Plan FOTO: Progress LE strength, Gait  training - heel to toe sequencing, progress balance as appropriate.    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 [place], [place], [address] Phone: [phone]   Fax:  [phone]  Name: Teresa [name] MRN: [id number] Date of Birth: Apr 24, 1940

## 2021-06-01 ENCOUNTER — Other Ambulatory Visit: Payer: Self-pay

## 2021-06-01 ENCOUNTER — Ambulatory Visit: Payer: Medicare PPO

## 2021-06-01 DIAGNOSIS — R278 Other lack of coordination: Secondary | ICD-10-CM

## 2021-06-01 DIAGNOSIS — M6281 Muscle weakness (generalized): Secondary | ICD-10-CM

## 2021-06-01 DIAGNOSIS — R2681 Unsteadiness on feet: Secondary | ICD-10-CM

## 2021-06-01 DIAGNOSIS — R2689 Other abnormalities of gait and mobility: Secondary | ICD-10-CM

## 2021-06-01 NOTE — Therapy (Signed)
[place] MAIN [facility] SERVICES [address] [place], [place], [address] Phone: [phone]   Fax:  [phone]  Occupational Therapy Treatment  Patient Details  Name: Dulse Rutan MRN: [id number] Date of Birth: 10-19-1940 No data recorded  Encounter Date: 06/01/2021   OT End of Session - 06/01/21 1019     Visit Number 3    Number of Visits 24    Date for OT Re-Evaluation 08/18/21    OT Start Time 0845    OT Stop Time 0930    OT Time Calculation (min) 45 min    Activity Tolerance Patient tolerated treatment well    Behavior During Therapy [facility] for tasks assessed/performed             Past Medical History:  Diagnosis Date   Cancer ([place])    skin   Hypothyroidism    Raynaud's disease     Past Surgical History:  Procedure Laterality Date   [place]  2010   Cervical fusion C4-5-6-7   CATARACT EXTRACTION, BILATERAL Bilateral 10/2016   CHOLECYSTECTOMY  1995   COLONOSCOPY WITH PROPOFOL N/A 12/08/2018   Procedure: COLONOSCOPY WITH PROPOFOL;  Surgeon: Toledo, Benay Pike, MD;  Location: [facility] ENDOSCOPY;  Service: Gastroenterology;  Laterality: N/A;   EYE SURGERY      There were no vitals filed for this visit.   Subjective Assessment - 06/01/21 1017     Subjective  "I bumped my L shoulder into the wall yesterday when I was doing my exercises on 1 foot.  It's not bruised but it's very sore."    Pertinent History February 26, 2021, pt reports she had a CVA, came to [facility] to ER and then was transferred to [facility] in [place] where she was admitted and after acute care she went to inpatient rehab.  Following inpt rehab, pt went home and had home health.    Patient Stated Goals Pt reports her goal is to get back to normal, be as independent as possible.    Currently in Pain? Yes    Pain Score 6     Pain Location Shoulder    Pain Orientation Left    Pain Descriptors /  Indicators Aching;Sore    Pain Type Chronic pain    Pain Radiating Towards shouulder to hand    Pain Onset More than a month ago    Pain Frequency Intermittent    Aggravating Factors  activity    Pain Relieving Factors rest, gentle ROM, heat    Effect of Pain on Daily Activities pt guards LUE during activity and transfers    Multiple Pain Sites No           Occupational Therapy Treatment Manual Therapy: Performed gentle passive and prolonged stretching for L wrist flex/ext and digit flex/ext.  Isolated flexion stretch for L hand digits MCP, PIP, and DIP flexion of digits 2-5, and thumb IP and MP flexion.  Reviewed self passive stretching techniques reinforcing slow/prolonged stretch for L wrist and hand with good return demo. Performed edema massage to L hand and reviewed massage technique with pt; pt verbalized understanding and reports she's been massaging her hand at home.     Therapeutic Exercise: Instructed pt in gentle table slide for bilat shoulder flex, tactile cues for L side x7.  Pt participated in seated UBE x 2.5 min forward rotation and x 2.5 min reverse rotation, working to achieve  reciprocal motion and increase LUE strength for self care.  Pt required intermittent vc to reduce slight L lateral lean and able to correct each time with this cue.  ACE wrap used to secure L hand to pedal with good tolerance.  Pt intermittently was able to remove R arm from pedal to demo good use of L arm to continue forward and reverse rotations.  OT adjusted resistance throughout as needed.  In supine, performed AAROM for L shoulder flex/abd/horiz abd/add, ER/IR with arm abducted at 45.  Min A-min guard to control movements and achieve end range within pain tolerance.  Performed gentle passive prolonged forearm pron/sup to tolerance and L hand active hand squeezes x7, alternated by passive digit extension after each rep.    Response to Treatment: Pt reporting increased soreness at L shoulder this day  after bumping it on the wall when working on her single leg stance exercises at home yesterday.  PT reported adjusting exercise to use countertop for support.  Pt reports no bruising but soreness present.  Pt declined hot or cold pack today, stating that she's been using a heating pad at home.  Pt tolerated all shoulder and hand exercises well this day.  Kept L shoulder mobility within a pain free range in supine with good tolerance.  Following prolonged passive stretching and edema massage to L hand, pt was able to touch very tip of fingertips to palm.  Decreased edema in L hand this day.  OT reinforced continued focus on self passive finger flexion and wrist extension at home; pt reports good carryover.  Pt will continue to benefit from skilled OT to manage L shoulder pain, to increase LUE strength and coordination, and manage edema in L hand, working to increase engagement of LUE into daily tasks.      OT Education - 06/01/21 1018     Education Details LUE HEP including PROM/AAROM/AROM, edema management for L hand    Person(s) Educated Patient    Methods Explanation    Comprehension Verbalized understanding;Returned demonstration;Verbal cues required;Need further instruction;Tactile cues required                 OT Long Term Goals - 05/26/21 1730       OT LONG TERM GOAL #1   Title Pt will be independent with home exercise program.    Baseline no current program    Time 12    Period Weeks    Status New    Target Date 08/18/21      OT LONG TERM GOAL #2   Title Pt will complete UB and LB dressing with modified independence including buttons, snaps and zippers.    Baseline requires min assist at eval    Time 12    Period Weeks    Status New    Target Date 08/18/21      OT LONG TERM GOAL #3   Title Pt will perform shower transfer with modified independence.    Baseline Pt requires supervision to min assist for shower transfer at home.    Time 6    Period Weeks    Status New     Target Date 07/07/21      OT LONG TERM GOAL #4   Title Pt will improve L hand grip by 10# to assist with holding items in left hand securely.    Baseline no grip in left hand at eval    Time 12    Period Weeks    Status New  Target Date 08/18/21      OT LONG TERM GOAL #5   Title Pt will improve left shoulder flexion by 10 degrees to improve reaching to obtain self care items from shelf/shoulder height.    Baseline difficulty with reach, shoulder flexion to 47 degrees.    Time 12    Period Weeks    Status New    Target Date 07/07/21      Long Term Additional Goals   Additional Long Term Goals Yes      OT LONG TERM GOAL #6   Title Pt will improve FOTO score to 47 or above to demonstrate a clinically relevant change in function to impact ADL tasks.    Baseline score of 30 at eval    Time 12    Period Weeks    Status New    Target Date 08/18/21      OT LONG TERM GOAL #7   Title Pt will demonstrate ability to pick up small objects and complete 9 hole peg test in less than 2 mins.    Baseline unable to perform at eval    Time 12    Period Weeks    Status New    Target Date 08/18/21              Plan - 06/01/21 1038     Clinical Impression Statement Pt reporting increased soreness at L shoulder this day after bumping it on the wall when working on her single leg stance exercises at home yesterday.  PT reported adjusting exercise to use countertop for support.  Pt reports no bruising but soreness present.  Pt declined hot or cold pack today, stating that she's been using a heating pad at home.  Pt tolerated all shoulder and hand exercises well this day.  Kept L shoulder mobility within a pain free range in supine with good tolerance.  Following prolonged passive stretching and edema massage to L hand, pt was able to touch very tip of fingertips to palm.  Decreased edema in L hand this day.  OT reinforced continued focus on self passive finger flexion and wrist extension at  home; pt reports good carryover.  Pt will continue to benefit from skilled OT to manage L shoulder pain, to increase LUE strength and coordination, and manage edema in L hand, working to increase engagement of LUE into daily tasks.    OT Occupational Profile and History Detailed Assessment- Review of Records and additional review of physical, cognitive, psychosocial history related to current functional performance    Occupational performance deficits (Please refer to evaluation for details): ADL's;IADL's;Leisure;Rest and Sleep    Body Structure / Function / Physical Skills ADL;Coordination;Endurance;GMC;UE functional use;Balance;IADL;Pain;Dexterity;FMC;Strength;Edema;Mobility;ROM    Psychosocial Skills Environmental  Adaptations;Habits;Routines and Behaviors    Rehab Potential Good    Clinical Decision Making Several treatment options, min-mod task modification necessary    Comorbidities Affecting Occupational Performance: May have comorbidities impacting occupational performance    Modification or Assistance to Complete Evaluation  Min-Moderate modification of tasks or assist with assess necessary to complete eval    OT Frequency 2x / week    OT Duration 12 weeks    OT Treatment/Interventions Self-care/ADL training;Cryotherapy;Paraffin;Therapeutic exercise;DME and/or AE instruction;Functional Mobility Training;Balance training;Electrical Stimulation;Ultrasound;Neuromuscular education;Manual Therapy;Splinting;Moist Heat;Contrast Bath;Passive range of motion;Therapeutic activities;Patient/family education;Coping strategies training    Consulted and Agree with Plan of Care Patient             Patient will benefit from skilled therapeutic intervention in order  to improve the following deficits and impairments:   Body Structure / Function / Physical Skills: ADL, Coordination, Endurance, GMC, UE functional use, Balance, IADL, Pain, Dexterity, FMC, Strength, Edema, Mobility, ROM   Psychosocial  Skills: Environmental  Adaptations, Habits, Routines and Behaviors   Visit Diagnosis: Muscle weakness (generalized)  Other lack of coordination    Problem List Patient Active Problem List   Diagnosis Date Noted   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

## 2021-06-01 NOTE — Therapy (Signed)
[place] [facility] SERVICES [address] [place], [place], [address] Phone: [phone]   Fax:  [phone]  Physical Therapy Treatment  Patient Details  Name: Teresa [name] MRN: [id number] Date of Birth: 01-28-1941 Referring Provider (PT): [name], MD   Encounter Date: 06/01/2021   PT End of Session - 06/01/21 1046     Visit Number 3    Number of Visits 25    Date for PT Re-Evaluation 08/16/21    PT Start Time 0802    PT Stop Time 0845    PT Time Calculation (min) 43 min    Equipment Utilized During Treatment Gait belt    Activity Tolerance Patient tolerated treatment well    Behavior During Therapy [facility] for tasks assessed/performed             Past Medical History:  Diagnosis Date   Cancer ([place])    skin   Hypothyroidism    Raynaud's disease     Past Surgical History:  Procedure Laterality Date   [place]   BACK SURGERY  2010   Cervical fusion C4-5-6-7   CATARACT EXTRACTION, BILATERAL Bilateral 10/2016   CHOLECYSTECTOMY  1995   COLONOSCOPY WITH PROPOFOL N/A 12/08/2018   Procedure: COLONOSCOPY WITH PROPOFOL;  Surgeon: Toledo, Benay Pike, MD;  Location: [facility] ENDOSCOPY;  Service: Gastroenterology;  Laterality: N/A;   EYE SURGERY      There were no vitals filed for this visit.   Subjective Assessment - 06/01/21 0804     Subjective The patient reports she fell into the wall but not to the floor when attempting single-leg balance at home.  She reports she was doing well but then started to feel tired. She reports no bruising of her left shoulder but some pain and rates it currently a 6 out of 10.  She reports no other injury.    Pertinent History Pt is a 81 y.o. 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 Pt completed CIR from 03/02/21-03/22/21. PMH includes: skin cancer, hypothyroidism, and Raynaud's disease, history of cervical fusion in 2010 C4-C7.  Pt  reports HH PT/OT/SLP was performed from January 2nd to February 28th with focus on ADL completion, ambulation. Pt's big goal tranisitioning to ambulating up to 3 miles a day which pt was doing prior to stroke. Pt has been ambulating in [employer] with husband but last day or two pt has noticed increased L foot drag but states it is better today. Pt denies any falls at home or community. Pt reports balance deficits with turns primarily. States her balance is good with her quad cane. Wishing to return to full independence in ambulation with household and community ambulation. Wishing to be able to asc/desc full flight of stairs with ability to carry decorative items and other household items without need for handrail and/or AD.    Limitations Lifting;Standing;Walking;House hold activities    How long can you sit comfortably? unlimited    How long can you stand comfortably? unlimited    How long can you walk comfortably? ~ 1.5 hours    Patient Stated Goals Return to independent walking for 3 miles. Return to to crochet.    Currently in Pain? Yes    Pain Score 6     Pain Location Shoulder    Pain Orientation Left    Pain Type Acute pain    Pain Onset Yesterday  Interventions-  Neuro Re-ed: gait belt donned and CGA provided throughout unless otherwise specified  SLB 2x30 sec each LE with at least one finger support on bar  Tandem stance 2x30 sec each LE -progressed to performing for several minutes with cog dual task. Pt with multiple errors with counting, naming animals, naming people, in addition to increased sway.  NBOS EC 60 sec; pt with increased sway  On Airex NBOS EC 60 sec; requires up to min a  WBOS EO with vertical and horizontal head turns x multiple of each, B directions  Gait in hallway- Approx 4x150 feet using no AD- While in hallway- performing horizontal/diagonal head turns - calling out items on sticky notes positioned on walls- continued mild gait deviation-  slower pace - difficulty maintaining heel-toe sequencing.  Agility ladder for heel-toe sequencing and dynamic balance with and without UE support - pt with a few errors, however, rates easy 8x length of bars  Cone heel taps with focus on LLE, UUE support. Performed for SLB progression and eccentric control x multiple reps, knocks over cones/  Access Code: [email or web address] URL: [email or web address] Date: 06/01/2021 Prepared by: Ricard Dillon  Exercises Single Leg Stance with Support - 1 x daily - 3 x weekly - 3 sets - 5 reps - 5-10 hold     Education provided throughout session via VC/TC and demonstration to facilitate movement at target joints and correct muscle activation for all testing and exercises performed.         PT Education - 06/01/21 1045     Education Details Updated HEP to change single-leg balance to be performed with upper extremitiy support.  Instructed patients in importance of not performing challenging exercises when feeling too tired.    Person(s) Educated Patient    Methods Explanation;Demonstration;Verbal cues;Handout    Comprehension Verbalized understanding;Returned demonstration              PT Short Term Goals - 05/24/21 0932       PT SHORT TERM GOAL #1   Title Pt will be indep with HEP to improve balance, strength, and gait to optimize independence with ADL completion    Baseline 05/24/21: Initiated    Time 6    Period Weeks    Status New    Target Date 07/05/21               PT Long Term Goals - 05/24/21 0933       PT LONG TERM GOAL #1   Title Pt will improve FOTO to target score to demonstrate clinically significant improvement in functional mobility    Baseline 05/24/21: next session    Time 12    Period Weeks    Status New    Target Date 08/02/21      PT LONG TERM GOAL #2   Title Pt will improve 5xSTS to 12 sec or less to indicate clinically significant improvement in LE strength    Baseline 05/24/21: 17.51 sec    Time  12    Period Weeks    Status New    Target Date 08/16/21      PT LONG TERM GOAL #3   Title Pt will improve 6 MWT by 165' to indicate clinically significant improvement in community ambulation distance for geriatric stroke norms.    Baseline 05/24/21: 765'    Time 12    Period Weeks    Status New    Target Date 08/16/21      PT LONG TERM GOAL #4  Title Pt will improve 10 m gait speed to at least 1.0 m/s to demonstrate reduced risk of falls with community ambulation tasks.    Baseline 05/24/21: .63 m/s without AD    Time 12    Period Weeks    Status New    Target Date 08/16/21      PT LONG TERM GOAL #5   Title Pt will improve FGA  by at least 5 point to demonstrate clinically signifcant reduced risk of falls with dynamic balance/ambulation tasks.    Baseline 05/24/21: 17    Time 12    Period Weeks    Status New    Target Date 08/16/21                   Plan - 06/01/21 1047     Clinical Impression Statement PT updated patient's HEP to single-leg balance with support due to patient fall into wall when attempting single-leg balance at home without support.  Patient verbalized understanding and demonstrated proper technique.  Patient most challenged this session with dual task balance intervention with multiple errors.  The patient will benefit from further skilled PT to improve strength, balance, and mobility to return to previous level of function and to decrease fall risk.    Personal Factors and Comorbidities Age;Time since onset of injury/illness/exacerbation;Comorbidity 3+;Past/Current Experience    Examination-Activity Limitations Squat;Stairs;Locomotion Level;Stand    Examination-Participation Restrictions Shop;Community Activity;Yard Work    [employer] officer Evolving/Moderate complexity    Rehab Potential Good    PT Frequency 2x / week    PT Duration 12 weeks    PT Treatment/Interventions ADLs/Self Care Home Management;Canalith Repostioning;Electrical  Stimulation;DME Instruction;Gait training;Stair training;Functional mobility training;Therapeutic activities;Patient/family education;Therapeutic exercise;Balance training;Neuromuscular re-education;Energy conservation;Vestibular;Passive range of motion    PT Next Visit Plan FOTO: Progress LE strength, Gait training - heel to toe sequencing, progress balance as appropriate.    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 [place], [place], [address] Phone: [phone]   Fax:  [phone]  Name: Teresa [name] MRN: [id number] Date of Birth: Nov 15, 1940

## 2021-06-04 ENCOUNTER — Other Ambulatory Visit: Payer: Self-pay

## 2021-06-04 ENCOUNTER — Ambulatory Visit: Payer: Medicare PPO | Admitting: Occupational Therapy

## 2021-06-04 ENCOUNTER — Ambulatory Visit: Payer: Medicare PPO

## 2021-06-04 DIAGNOSIS — R278 Other lack of coordination: Secondary | ICD-10-CM

## 2021-06-04 DIAGNOSIS — R2689 Other abnormalities of gait and mobility: Secondary | ICD-10-CM

## 2021-06-04 DIAGNOSIS — M6281 Muscle weakness (generalized): Secondary | ICD-10-CM

## 2021-06-04 DIAGNOSIS — G8929 Other chronic pain: Secondary | ICD-10-CM

## 2021-06-04 DIAGNOSIS — R2681 Unsteadiness on feet: Secondary | ICD-10-CM

## 2021-06-04 NOTE — Therapy (Signed)
[place] MAIN [facility] SERVICES [address] [place], [place], [address] Phone: [phone]   Fax:  [phone]  Physical Therapy Treatment  Patient Details  Name: Teresa [name] MRN: [id number] Date of Birth: [birth year] Referring Provider (PT): [name], MD   Encounter Date: 06/04/2021   PT End of Session - 06/04/21 1114     Visit Number 4    Number of Visits 25    Date for PT Re-Evaluation 08/16/21    PT Start Time 0917    PT Stop Time 1000    PT Time Calculation (min) 43 min    Equipment Utilized During Treatment Gait belt    Activity Tolerance Patient tolerated treatment well    Behavior During Therapy [facility] for tasks assessed/performed             Past Medical History:  Diagnosis Date   Cancer ([place])    skin   Hypothyroidism    Raynaud's disease     Past Surgical History:  Procedure Laterality Date   [place]   BACK SURGERY  2010   Cervical fusion C4-5-6-7   CATARACT EXTRACTION, BILATERAL Bilateral 10/2016   CHOLECYSTECTOMY  1995   COLONOSCOPY WITH PROPOFOL N/A 12/08/2018   Procedure: COLONOSCOPY WITH PROPOFOL;  Surgeon: Toledo, Benay Pike, MD;  Location: [facility] ENDOSCOPY;  Service: Gastroenterology;  Laterality: N/A;   EYE SURGERY      There were no vitals filed for this visit.   Subjective Assessment - 06/04/21 0916     Subjective Pt reports no pain currently. Pt reports no falls/stumbles. Pt reports performing her HEP and that it went well.    Pertinent History Pt is a 81 y.o. 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 Pt completed CIR from 03/02/21-03/22/21. PMH includes: skin cancer, hypothyroidism, and Raynaud's disease, history of cervical fusion in 2010 C4-C7.  Pt reports HH PT/OT/SLP was performed from January 2nd to February 28th with focus on ADL completion, ambulation. Pt's big goal tranisitioning to ambulating up to 3 miles a day which pt was  doing prior to stroke. Pt has been ambulating in [employer] with husband but last day or two pt has noticed increased L foot drag but states it is better today. Pt denies any falls at home or community. Pt reports balance deficits with turns primarily. States her balance is good with her quad cane. Wishing to return to full independence in ambulation with household and community ambulation. Wishing to be able to asc/desc full flight of stairs with ability to carry decorative items and other household items without need for handrail and/or AD.    Limitations Lifting;Standing;Walking;House hold activities    How long can you sit comfortably? unlimited    How long can you stand comfortably? unlimited    How long can you walk comfortably? ~ 1.5 hours    Patient Stated Goals Return to independent walking for 3 miles. Return to to crochet.    Currently in Pain? No/denies    Pain Onset More than a month ago               Interventions-   Neuro Re-ed: gait belt donned and CGA provided throughout unless otherwise specified   SLB 2x30 sec each LE with intermittent UE support. Rates medium   Tandem stance with dual cog task 45 sec each LE; intermittent UE support, 3 errors.       Cone  heel taps with focus on LLE, UUE support. Performed for SLB progression and eccentric control x multiple reps, knocks over cones 2x   Gait in hallway- Approx 2x150 feet using no AD- While in hallway- performing horizontal/diagonal head turns - calling out items on sticky notes positioned on walls- continued mild gait deviation- continued difficulty maintaining heel-toe sequencing. Cuing for upright posture  Gait in hallway with vertical head turns 2x length of hallway. Pt rates as somewhat challenging.  On Airex NBOS EC 2x90 sec; requires up to min a, still with increased sway. Intermittent UE support on bar   Therex-  Standing hip abduction 1x20 each LE; rates easy  --added 2.5# for 2x12 each LE, addition of TC/VC  to maintain upright posture, correct technique. Pt struggles with compensating with hip flexion and knee flexion, particularly on LLE.  STS 12x. VC technique/decrease knee valgus  --progressed to placement of airex under RLE to promote weightbearing through LLE. Pt performs 12 reps, rates challenging.  Standing hip abduction with 2# - 2x12 B, difficulty with L hip ext.   Retro-stepping in // bars to promote L hip extension with 2# weights donned 8x. PT provides TC for technique  Education provided throughout session via VC/TC and demonstration to facilitate movement at target joints and correct muscle activation for all testing and exercises performed.       PT Short Term Goals - 05/24/21 0932       PT SHORT TERM GOAL #1   Title Pt will be indep with HEP to improve balance, strength, and gait to optimize independence with ADL completion    Baseline 05/24/21: Initiated    Time 6    Period Weeks    Status New    Target Date 07/05/21               PT Long Term Goals - 05/24/21 0933       PT LONG TERM GOAL #1   Title Pt will improve FOTO to target score to demonstrate clinically significant improvement in functional mobility    Baseline 05/24/21: next session    Time 12    Period Weeks    Status New    Target Date 08/02/21      PT LONG TERM GOAL #2   Title Pt will improve 5xSTS to 12 sec or less to indicate clinically significant improvement in LE strength    Baseline 05/24/21: 17.51 sec    Time 12    Period Weeks    Status New    Target Date 08/16/21      PT LONG TERM GOAL #3   Title Pt will improve 6 MWT by 165' to indicate clinically significant improvement in community ambulation distance for geriatric stroke norms.    Baseline 05/24/21: 765'    Time 12    Period Weeks    Status New    Target Date 08/16/21      PT LONG TERM GOAL #4   Title Pt will improve 10 m gait speed to at least 1.0 m/s to demonstrate reduced risk of falls with community ambulation tasks.     Baseline 05/24/21: .63 m/s without AD    Time 12    Period Weeks    Status New    Target Date 08/16/21      PT LONG TERM GOAL #5   Title Pt will improve FGA  by at least 5 point to demonstrate clinically signifcant reduced risk of falls with dynamic balance/ambulation tasks.  Baseline 05/24/21: 17    Time 12    Period Weeks    Status New    Target Date 08/16/21                   Plan - 06/04/21 1117     Clinical Impression Statement Pt able to progress to more challenging strengthening interventions on this date, with focus on promoting correct mechanics and weightbering of LLE. Pt most challenged with L hip extension and maintaining technique with hip abduction interventions, indicating decreased mobility and strength. The pt will benefit from further skilled PT to improve strength, balance and mobility to decrease fall risk.    Personal Factors and Comorbidities Age;Time since onset of injury/illness/exacerbation;Comorbidity 3+;Past/Current Experience    Examination-Activity Limitations Squat;Stairs;Locomotion Level;Stand    Examination-Participation Restrictions Shop;Community Activity;Yard Work    [employer] officer Evolving/Moderate complexity    Rehab Potential Good    PT Frequency 2x / week    PT Duration 12 weeks    PT Treatment/Interventions ADLs/Self Care Home Management;Canalith Repostioning;Electrical Stimulation;DME Instruction;Gait training;Stair training;Functional mobility training;Therapeutic activities;Patient/family education;Therapeutic exercise;Balance training;Neuromuscular re-education;Energy conservation;Vestibular;Passive range of motion    PT Next Visit Plan FOTO: Progress LE strength, Gait training - heel to toe sequencing, progress balance as appropriate.    PT Home Exercise Plan Seated L ankle DF; 3/10 Access Code: [email or web address]    Consulted and Agree with Plan of Care Patient             Patient will benefit from skilled therapeutic  intervention in order to improve the following deficits and impairments:  Abnormal gait, Pain, Decreased mobility, Decreased activity tolerance, Decreased endurance, Decreased strength, Decreased balance, Difficulty walking  Visit Diagnosis: Unsteadiness on feet  Other lack of coordination  Muscle weakness (generalized)  Other abnormalities of gait and mobility     Problem List Patient Active Problem List   Diagnosis Date Noted   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 [place], [place], [address] Phone: [phone]   Fax:  [phone]  Name: Teresa [name] MRN: [id number] Date of Birth: 11/30/1940

## 2021-06-06 ENCOUNTER — Ambulatory Visit: Payer: Medicare PPO

## 2021-06-06 ENCOUNTER — Ambulatory Visit: Payer: Medicare PPO | Admitting: Occupational Therapy

## 2021-06-06 ENCOUNTER — Other Ambulatory Visit: Payer: Self-pay

## 2021-06-06 DIAGNOSIS — R2681 Unsteadiness on feet: Secondary | ICD-10-CM

## 2021-06-06 DIAGNOSIS — R269 Unspecified abnormalities of gait and mobility: Secondary | ICD-10-CM

## 2021-06-06 DIAGNOSIS — R2689 Other abnormalities of gait and mobility: Secondary | ICD-10-CM

## 2021-06-06 DIAGNOSIS — R482 Apraxia: Secondary | ICD-10-CM

## 2021-06-06 DIAGNOSIS — R278 Other lack of coordination: Secondary | ICD-10-CM

## 2021-06-06 DIAGNOSIS — M6281 Muscle weakness (generalized): Secondary | ICD-10-CM

## 2021-06-06 DIAGNOSIS — R262 Difficulty in walking, not elsewhere classified: Secondary | ICD-10-CM

## 2021-06-06 DIAGNOSIS — I63511 Cerebral infarction due to unspecified occlusion or stenosis of right middle cerebral artery: Secondary | ICD-10-CM

## 2021-06-06 NOTE — Therapy (Signed)
[place] [facility] [facility] ?[place], [place], [address] ?Phone: [phone]   Fax:  [phone] ? ?Physical Therapy Treatment ? ?Patient Details  ?Name: Teresa [name] [facility] ?MRN: [id number] ?Date of Birth: 1941-02-22 ?Referring Provider (PT): [name], MD ? ? ?Encounter Date: 06/06/2021 ? ? PT End of Session - 06/06/21 2057   ? ? Visit Number 5   ? Number of Visits 25   ? Date for PT Re-Evaluation 08/16/21   ? PT Start Time [id number]   ? PT Stop Time 1014   ? PT Time Calculation (min) 43 min   ? Equipment Utilized During Treatment Gait belt   ? Activity Tolerance Patient tolerated treatment well   ? Behavior During Therapy [facility] for tasks assessed/performed   ? ?  ?  ? ?  ? ? ?Past Medical History:  ?Diagnosis Date  ? Cancer [facility])   ? skin  ? Hypothyroidism   ? Raynaud's disease   ? ? ?Past Surgical History:  ?Procedure Laterality Date  ? ABDOMINAL HYSTERECTOMY  1991  ? APPENDECTOMY  1991  ? BACK SURGERY  2010  ? Cervical fusion C4-5-6-7  ? CATARACT EXTRACTION, BILATERAL Bilateral 10/2016  ? CHOLECYSTECTOMY  1995  ? COLONOSCOPY WITH PROPOFOL N/A 12/08/2018  ? Procedure: COLONOSCOPY WITH PROPOFOL;  Surgeon: Toledo, Benay Pike, MD;  Location: [facility] ENDOSCOPY;  Service: Gastroenterology;  Laterality: N/A;  ? EYE SURGERY    ? ? ?There were no vitals filed for this visit. ? ? Subjective Assessment - 06/06/21 0931   ? ? Subjective Patient reports doing well overall. States compliant with exercises and no falls. She denies any pain.   ? Pertinent History Pt is a 81 y.o. 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 Pt completed CIR from 03/02/21-03/22/21. PMH includes: skin cancer, hypothyroidism, and Raynaud's disease, history of cervical fusion in 2010 C4-C7.  Pt reports HH PT/OT/SLP was performed from January 2nd to February 28th with focus on ADL completion, ambulation. Pt's big goal tranisitioning to ambulating up to 3 miles a day which pt was doing  prior to stroke. Pt has been ambulating in [employer] with husband but last day or two pt has noticed increased L foot drag but states it is better today. Pt denies any falls at home or community. Pt reports balance deficits with turns primarily. States her balance is good with her quad cane. Wishing to return to full independence in ambulation with household and community ambulation. Wishing to be able to asc/desc full flight of stairs with ability to carry decorative items and other household items without need for handrail and/or AD.   ? Limitations Lifting;Standing;Walking;House hold activities   ? How long can you sit comfortably? unlimited   ? How long can you stand comfortably? unlimited   ? How long can you walk comfortably? ~ 1.5 hours   ? Patient Stated Goals Return to independent walking for 3 miles. Return to to crochet.   ? Currently in Pain? No/denies   ? Pain Onset More than a month ago   ? ?  ?  ? ?  ? ? ?INTERVENTIONS:  ? ?Neuromuscular re-ed: ? ?Dynamic marching on airex pad x 2 min ?Staggered standing on blue airex pad x 20 sec with eyes open and eyes closed x 20 sec x 2 trials ?Staggered stand (1 foot on airex pad and 1 on 4" step) - playing 2 games of Hangman- staggered standing in above  position. Increased unsteadiness initially during 1st game requiring some UE support. Decreased UE reaching during 2nd game. ? ?Sit to stand (feet staggered- 1 LE on airex pad)  x 10 reps then switch LE x 10 more reps.  ? ?Placing letter on magnet board in Alphabetical order - staggered and later standing on 1/2 foam (curve side up) x 3 trials.  ? ?Education provided throughout session via VC/TC and demonstration to facilitate movement at target joints and correct muscle activation for all testing and exercises performed.  ? ? ? ? ? ? ? ? ? ? ? ? ? ? ? ? ? ? ? ? ? ? ? ? ? ? ? PT Education - 06/06/21 0933   ? ? Education Details Balance exercise technique   ? Person(s) Educated Patient   ? Methods  Explanation;Demonstration;Tactile cues;Verbal cues   ? Comprehension Verbalized understanding;Returned demonstration;Verbal cues required;Tactile cues required;Need further instruction   ? ?  ?  ? ?  ? ? ? PT Short Term Goals - 05/24/21 0932   ? ?  ? PT SHORT TERM GOAL #1  ? Title Pt will be indep with HEP to improve balance, strength, and gait to optimize independence with ADL completion   ? Baseline 05/24/21: Initiated   ? Time 6   ? Period Weeks   ? Status New   ? Target Date 07/05/21   ? ?  ?  ? ?  ? ? ? ? PT Long Term Goals - 05/24/21 0933   ? ?  ? PT LONG TERM GOAL #1  ? Title Pt will improve FOTO to target score to demonstrate clinically significant improvement in functional mobility   ? Baseline 05/24/21: next session   ? Time 12   ? Period Weeks   ? Status New   ? Target Date 08/02/21   ?  ? PT LONG TERM GOAL #2  ? Title Pt will improve 5xSTS to 12 sec or less to indicate clinically significant improvement in LE strength   ? Baseline 05/24/21: 17.51 sec   ? Time 12   ? Period Weeks   ? Status New   ? Target Date 08/16/21   ?  ? PT LONG TERM GOAL #3  ? Title Pt will improve 6 MWT by 165' to indicate clinically significant improvement in community ambulation distance for geriatric stroke norms.   ? Baseline 05/24/21: 765'   ? Time 12   ? Period Weeks   ? Status New   ? Target Date 08/16/21   ?  ? PT LONG TERM GOAL #4  ? Title Pt will improve 10 m gait speed to at least 1.0 m/s to demonstrate reduced risk of falls with community ambulation tasks.   ? Baseline 05/24/21: .63 m/s without AD   ? Time 12   ? Period Weeks   ? Status New   ? Target Date 08/16/21   ?  ? PT LONG TERM GOAL #5  ? Title Pt will improve FGA  by at least 5 point to demonstrate clinically signifcant reduced risk of falls with dynamic balance/ambulation tasks.   ? Baseline 05/24/21: 17   ? Time 12   ? Period Weeks   ? Status New   ? Target Date 08/16/21   ? ?  ?  ? ?  ? ? ? ? ? ? ? ? Plan - 06/06/21 0934   ? ? Clinical Impression Statement Patient  participated well with balance activities today- able to perform  some dual tasking and challenged with static/dynamic balance today. She was able to adapt quickly with new situations and return demonstration with improved balance with practice. The pt will benefit from further skilled PT to improve strength, balance and mobility to decrease fall risk.   ? Personal Factors and Comorbidities Age;Time since onset of injury/illness/exacerbation;Comorbidity 3+;Past/Current Experience   ? Examination-Activity Limitations Squat;Stairs;Locomotion Level;Stand   ? Examination-Participation Restrictions Shop;Community Activity;[employer] Work   ? Stability/Clinical Decision Making Evolving/Moderate complexity   ? Rehab Potential Good   ? PT Frequency 2x / week   ? PT Duration 12 weeks   ? PT Treatment/Interventions ADLs/Self Care Home Management;Canalith Repostioning;Electrical Stimulation;DME Instruction;Gait training;Stair training;Functional mobility training;Therapeutic activities;Patient/family education;Therapeutic exercise;Balance training;Neuromuscular re-education;Energy conservation;Vestibular;Passive range of motion   ? PT Next Visit Plan FOTO: Progress LE strength, Gait training - heel to toe sequencing, progress balance as appropriate.   ? PT Home Exercise Plan Seated L ankle DF; 3/10 Access Code: [email or web address]   ? Consulted and Agree with Plan of Care Patient   ? ?  ?  ? ?  ? ? ?Patient will benefit from skilled therapeutic intervention in order to improve the following deficits and impairments:  Abnormal gait, Pain, Decreased mobility, Decreased activity tolerance, Decreased endurance, Decreased strength, Decreased balance, Difficulty walking ? ?Visit Diagnosis: ?Unsteadiness on feet ? ?Other lack of coordination ? ?Muscle weakness (generalized) ? ?Other abnormalities of gait and mobility ? ?Apraxia ? ?Abnormality of gait and mobility ? ?Difficulty in walking, not elsewhere classified ? ?Right middle cerebral artery stroke  [facility]) ? ? ? ? ?Problem List ?Patient Active Problem List  ? Diagnosis Date Noted  ? Right middle cerebral artery stroke ([place]) 03/01/2021  ? Stroke [facility]) 02/27/2021  ? History of nonmelanoma skin cancer 05/23/2014  ? ? ?Morton Stall

## 2021-06-08 NOTE — Therapy (Signed)
[place] ?[place] [facility] ?[place], [place], [address] ?Phone: [phone]   Fax:  [phone] ? ?Occupational Therapy Treatment ? ?Patient Details  ?Name: Teresa [name] [facility] ?MRN: [id number] ?Date of Birth: 09-11-1940 ?No data recorded ? ?Encounter Date: 06/04/2021 ? ? OT End of Session - 06/07/21 0941   ? ? Visit Number 4   ? Number of Visits 24   ? Date for OT Re-Evaluation 08/18/21   ? OT Start Time 0830   ? OT Stop Time 0915   ? OT Time Calculation (min) 45 min   ? Activity Tolerance Patient tolerated treatment well   ? Behavior During Therapy [facility] for tasks assessed/performed   ? ?  ?  ? ?  ? ? ?Past Medical History:  ?Diagnosis Date  ? Cancer [facility])   ? skin  ? Hypothyroidism   ? Raynaud's disease   ? ? ?Past Surgical History:  ?Procedure Laterality Date  ? ABDOMINAL HYSTERECTOMY  1991  ? APPENDECTOMY  1991  ? BACK SURGERY  2010  ? Cervical fusion C4-5-6-7  ? CATARACT EXTRACTION, BILATERAL Bilateral 10/2016  ? CHOLECYSTECTOMY  1995  ? COLONOSCOPY WITH PROPOFOL N/A 12/08/2018  ? Procedure: COLONOSCOPY WITH PROPOFOL;  Surgeon: Toledo, Benay Pike, MD;  Location: [facility] ENDOSCOPY;  Service: Gastroenterology;  Laterality: N/A;  ? EYE SURGERY    ? ? ?There were no vitals filed for this visit. ? ? Subjective Assessment - 06/07/21 0939   ? ? Subjective  Pt reports her shoulder is feeling better after bumping into the wall last week.  "It is doing much better,"   ? Pertinent History February 26, 2021, pt reports she had a CVA, came to [facility] to ER and then was transferred to [facility] in [place] where she was admitted and after acute care she went to inpatient rehab.  Following inpt rehab, pt went home and had home health.   ? Patient Stated Goals Pt reports her goal is to get back to normal, be as independent as possible.   ? Currently in Pain? No/denies   ? Pain Score 0-No pain   ? Pain Onset More than a month ago   ? ?  ?  ? ?  ? ? ?Mat exercise gross grasp  and release with jumbo pegs ?Manual Therapy: ?Pt seen for manual mobilization skills to left scapula in sidelying for elevation, depression, facilitation of upwards rotation followed by AAROM for the same motions as well as combining motions for forwards and backwards shoulder roll.  Manual massage to left hand, wrist and forearm with carpals spreads to help facilitate edema drainage from hand and digits along with manual lymphatic drainage skills to address edema management.  Pt can doff compression glove independently and requires increase time and min assist to fold down glove for donning.   ? ?Neuro Reed: ?Pt seen for facilitation of left shoulder motion in supine for shoulder flexion to 90 degrees with attempts at place and hold with assist from therapist.  Performed 2 sets of 5 reps, utilized tapping and facilitory techniques.  Shoulder placed into 90 degrees of shoulder flexion and worked towards elbow flexion and extension/triceps for 2 sets of 5 reps.  Focused on return from flexion with arm with control of the movement, therapist providing support for the weight of the arm as needed.  Gross grasp and release patterns with use of jumbo pegs this date from seated position, pt unable to obtain items from table  or bucket but able to take from therapist when handed objects and then released into the bucket with cues.   ? ?Response to tx: ?Pt continues to demonstrate moderate edema in left hand and digits at times.  Edema is impacted with use of compression glove for edema management and pt is able to don glove after assistance to fold down the end of the glove along with increased time.  She responded well to manual massage and techniques for edema management in the clinic.  She reports as the day goes on, the edema is often worse later in the day.  Discussed positioning, especially while sleeping, she tends to be a back sleeper.  ROM remains limited in digits with presence of edema and is able to demonstrate  improved motion after manual techniques applied.  Continue to work towards goals in plan of care to maximize safety and independence in necessary daily tasks.  ? ? ? ? ? ? ? ? ? ? ? ? ? ? ? ? ? ? ? ? ? OT Education - 06/07/21 0940   ? ? Education Details edema control, exercises in supine for shoulder   ? Person(s) Educated Patient   ? Methods Explanation   ? Comprehension Verbalized understanding;Returned demonstration;Verbal cues required;Need further instruction;Tactile cues required   ? ?  ?  ? ?  ? ? ? ? ? ? OT Long Term Goals - 05/26/21 1730   ? ?  ? OT LONG TERM GOAL #1  ? Title Pt will be independent with home exercise program.   ? Baseline no current program   ? Time 12   ? Period Weeks   ? Status New   ? Target Date 08/18/21   ?  ? OT LONG TERM GOAL #2  ? Title Pt will complete UB and LB dressing with modified independence including buttons, snaps and zippers.   ? Baseline requires min assist at eval   ? Time 12   ? Period Weeks   ? Status New   ? Target Date 08/18/21   ?  ? OT LONG TERM GOAL #3  ? Title Pt will perform shower transfer with modified independence.   ? Baseline Pt requires supervision to min assist for shower transfer at home.   ? Time 6   ? Period Weeks   ? Status New   ? Target Date 07/07/21   ?  ? OT LONG TERM GOAL #4  ? Title Pt will improve L hand grip by 10# to assist with holding items in left hand securely.   ? Baseline no grip in left hand at eval   ? Time 12   ? Period Weeks   ? Status New   ? Target Date 08/18/21   ?  ? OT LONG TERM GOAL #5  ? Title Pt will improve left shoulder flexion by 10 degrees to improve reaching to obtain self care items from shelf/shoulder height.   ? Baseline difficulty with reach, shoulder flexion to 47 degrees.   ? Time 12   ? Period Weeks   ? Status New   ? Target Date 07/07/21   ?  ? Long Term Additional Goals  ? Additional Long Term Goals Yes   ?  ? OT LONG TERM GOAL #6  ? Title Pt will improve FOTO score to 47 or above to demonstrate a clinically  relevant change in function to impact ADL tasks.   ? Baseline score of 30 at eval   ?  Time 12   ? Period Weeks   ? Status New   ? Target Date 08/18/21   ?  ? OT LONG TERM GOAL #7  ? Title Pt will demonstrate ability to pick up small objects and complete 9 hole peg test in less than 2 mins.   ? Baseline unable to perform at eval   ? Time 12   ? Period Weeks   ? Status New   ? Target Date 08/18/21   ? ?  ?  ? ?  ? ? ? ? ? ? ? ? Plan - 06/07/21 0941   ? ? Clinical Impression Statement Pt continues to demonstrate moderate edema in left hand and digits at times.  Edema is impacted with use of compression glove for edema management and pt is able to don glove after assistance to fold down the end of the glove along with increased time.  She responded well to manual massage and techniques for edema management in the clinic.  She reports as the day goes on, the edema is often worse later in the day.  Discussed positioning, especially while sleeping, she tends to be a back sleeper.  ROM remains limited in digits with presence of edema and is able to demonstrate improved motion after manual techniques applied.  Continue to work towards goals in plan of care to maximize safety and independence in necessary daily tasks.   ? OT Occupational Profile and History Detailed Assessment- Review of Records and additional review of physical, cognitive, psychosocial history related to current functional performance   ? Occupational performance deficits (Please refer to evaluation for details): ADL's;IADL's;Leisure;Rest and Sleep   ? Body Structure / Function / Physical Skills ADL;Coordination;Endurance;GMC;UE functional use;Balance;IADL;Pain;Dexterity;FMC;Strength;Edema;Mobility;ROM   ? Psychosocial Skills Environmental  Adaptations;Habits;Routines and Behaviors   ? Rehab Potential Good   ? Clinical Decision Making Several treatment options, min-mod task modification necessary   ? Comorbidities Affecting Occupational Performance: May have  comorbidities impacting occupational performance   ? Modification or Assistance to Complete Evaluation  Min-Moderate modification of tasks or assist with assess necessary to complete eval   ? OT Frequ

## 2021-06-11 ENCOUNTER — Encounter: Payer: Self-pay | Admitting: Occupational Therapy

## 2021-06-11 NOTE — Therapy (Signed)
[place] [facility] ?[place], [place], [address] ?Phone: [phone]   Fax:  [phone] ? ?Occupational Therapy Treatment ? ?Patient Details  ?Name: Montoya Watkin [facility] ?MRN: [id number] ?Date of Birth: [birth year] ?No data recorded ? ?Encounter Date: 06/06/2021 ? ? OT End of Session - 06/11/21 1403   ? ? Visit Number 5   ? Number of Visits 24   ? Date for OT Re-Evaluation 08/18/21   ? OT Start Time 0845   ? OT Stop Time 0930   ? OT Time Calculation (min) 45 min   ? Activity Tolerance Patient tolerated treatment well   ? Behavior During Therapy [facility] for tasks assessed/performed   ? ?  ?  ? ?  ? ? ?Past Medical History:  ?Diagnosis Date  ? Cancer [facility])   ? skin  ? Hypothyroidism   ? Raynaud's disease   ? ? ?Past Surgical History:  ?Procedure Laterality Date  ? ABDOMINAL HYSTERECTOMY  1991  ? APPENDECTOMY  1991  ? BACK SURGERY  2010  ? Cervical fusion C4-5-6-7  ? CATARACT EXTRACTION, BILATERAL Bilateral 10/2016  ? CHOLECYSTECTOMY  1995  ? COLONOSCOPY WITH PROPOFOL N/A 12/08/2018  ? Procedure: COLONOSCOPY WITH PROPOFOL;  Surgeon: Toledo, Benay Pike, MD;  Location: [facility] ENDOSCOPY;  Service: Gastroenterology;  Laterality: N/A;  ? EYE SURGERY    ? ? ?There were no vitals filed for this visit. ? ? Subjective Assessment - 06/11/21 1401   ? ? Subjective  Pt reports she is doing well today and feels her edema is better at times.  Worse in the later part of the day but glove is helping.  Discussed sleeping positions.   ? Pertinent History February 26, 2021, pt reports she had a CVA, came to [facility] to ER and then was transferred to [facility] in [place] where she was admitted and after acute care she went to inpatient rehab.  Following inpt rehab, pt went home and had home health.   ? Patient Stated Goals Pt reports her goal is to get back to normal, be as independent as possible.   ? Currently in Pain? Yes   ? Pain Score 2    ? Pain Location Hand   ? Pain  Orientation Left   ? Pain Descriptors / Indicators Aching;Sore;Tightness   ? Pain Type Chronic pain   ? Pain Onset More than a month ago   ? Pain Frequency Intermittent   ? Aggravating Factors  activity, arm in a dependent position   ? ?  ?  ? ?  ? ? ?Manual Therapy: ?Pt seen for manual mobilization skills to left scapula in sidelying for elevation, depression, facilitation of upwards rotation followed by AAROM for the same motions as well as combining motions for forwards and backwards shoulder roll.  Manual massage to left hand, wrist and forearm with carpals spreads to help facilitate edema drainage from hand and digits along with manual lymphatic drainage skills to address edema management.  Discussed positioning of left arm while sleeping, she reports if trying to sleep on her side the arm pulls more and causes discomfort, she reports she is often a back sleeper, therapist demonstrated to patient how to place her arm on pillows for elevation in supine position to help with edema control, also from a sitting position when she is watching TV.   ? ?  ?Neuro Reed: ?Pt seen for facilitation of left shoulder motion in supine for shoulder flexion to  90 degrees with attempts at place and hold with assist from therapist.  Performed 2 sets of 8 reps, utilized tapping and facilitory techniques.  Shoulder placed into 90 degrees of shoulder flexion and worked towards elbow flexion and extension/triceps for 2 sets of 8 reps.  Focused on return from flexion with arm with control of the movement, therapist providing support for the weight of the arm as needed.  ?Attempts at composite fisting when edema was decreased in hand this date, still not able to complete but demonstrates increased active finger flexion when edema is more controlled after manual techniques and use of compression glove.  Gross grasp and release of 1/2 inch sized objects with cues and facilitation.   ? ?Response to tx: ?Pt continues to progress well,  decreased edema in left hand, digits and wrist with use of compression glove, she also responds well to manual techniques to decrease edema followed by AAROM of hand moving towards composite fisting.  Pt educated on positioning in bed and in sitting for edema control at home.  Decreased pain in arm and hand when edema is managed.  She is becoming more independent with donning compression glove, needs occasional assist to fold glove down for ease of donning.  Pt continues to respond well to therapeutic intervention, continues towards goals in plan of care to improve ROM, strength and edema control measures to promote increased function of left UE in daily tasks.  ? ? ? ? ? ? ? ? ? ? ? ? ? ? ? ? ? ? ? OT Education - 06/11/21 1403   ? ? Education Details edema control, exercises in supine for shoulder   ? Person(s) Educated Patient   ? Methods Explanation   ? Comprehension Verbalized understanding;Returned demonstration;Verbal cues required;Need further instruction;Tactile cues required   ? ?  ?  ? ?  ? ? ? ? ? ? OT Long Term Goals - 05/26/21 1730   ? ?  ? OT LONG TERM GOAL #1  ? Title Pt will be independent with home exercise program.   ? Baseline no current program   ? Time 12   ? Period Weeks   ? Status New   ? Target Date 08/18/21   ?  ? OT LONG TERM GOAL #2  ? Title Pt will complete UB and LB dressing with modified independence including buttons, snaps and zippers.   ? Baseline requires min assist at eval   ? Time 12   ? Period Weeks   ? Status New   ? Target Date 08/18/21   ?  ? OT LONG TERM GOAL #3  ? Title Pt will perform shower transfer with modified independence.   ? Baseline Pt requires supervision to min assist for shower transfer at home.   ? Time 6   ? Period Weeks   ? Status New   ? Target Date 07/07/21   ?  ? OT LONG TERM GOAL #4  ? Title Pt will improve L hand grip by 10# to assist with holding items in left hand securely.   ? Baseline no grip in left hand at eval   ? Time 12   ? Period Weeks   ?  Status New   ? Target Date 08/18/21   ?  ? OT LONG TERM GOAL #5  ? Title Pt will improve left shoulder flexion by 10 degrees to improve reaching to obtain self care items from shelf/shoulder height.   ? Baseline difficulty with reach, shoulder  flexion to 47 degrees.   ? Time 12   ? Period Weeks   ? Status New   ? Target Date 07/07/21   ?  ? Long Term Additional Goals  ? Additional Long Term Goals Yes   ?  ? OT LONG TERM GOAL #6  ? Title Pt will improve FOTO score to 47 or above to demonstrate a clinically relevant change in function to impact ADL tasks.   ? Baseline score of 30 at eval   ? Time 12   ? Period Weeks   ? Status New   ? Target Date 08/18/21   ?  ? OT LONG TERM GOAL #7  ? Title Pt will demonstrate ability to pick up small objects and complete 9 hole peg test in less than 2 mins.   ? Baseline unable to perform at eval   ? Time 12   ? Period Weeks   ? Status New   ? Target Date 08/18/21   ? ?  ?  ? ?  ? ? ? ? ? ? ? ? Plan - 06/11/21 1404   ? ? Clinical Impression Statement Pt continues to progress well, decreased edema in left hand, digits and wrist with use of compression glove, she also responds well to manual techniques to decrease edema followed by AAROM of hand moving towards composite fisting.  Pt educated on positioning in bed and in sitting for edema control at home.  Decreased pain in arm and hand when edema is managed.  She is becoming more independent with donning compression glove, needs occasional assist to fold glove down for ease of donning.  Pt continues to respond well to therapeutic intervention, continues towards goals in plan of care to improve ROM, strength and edema control measures to promote increased function of left UE in daily tasks.   ? OT Occupational Profile and History Detailed Assessment- Review of Records and additional review of physical, cognitive, psychosocial history related to current functional performance   ? Occupational performance deficits (Please refer to  evaluation for details): ADL's;IADL's;Leisure;Rest and Sleep   ? Body Structure / Function / Physical Skills ADL;Coordination;Endurance;GMC;UE functional use;Balance;IADL;Pain;Dexterity;FMC;Strength;Edema;Mobility;ROM   ? Ps

## 2021-06-12 ENCOUNTER — Other Ambulatory Visit: Payer: Self-pay

## 2021-06-12 ENCOUNTER — Ambulatory Visit: Payer: Medicare PPO | Admitting: Occupational Therapy

## 2021-06-12 ENCOUNTER — Ambulatory Visit: Payer: Medicare PPO

## 2021-06-12 DIAGNOSIS — M6281 Muscle weakness (generalized): Secondary | ICD-10-CM

## 2021-06-12 DIAGNOSIS — R278 Other lack of coordination: Secondary | ICD-10-CM

## 2021-06-12 DIAGNOSIS — R2689 Other abnormalities of gait and mobility: Secondary | ICD-10-CM

## 2021-06-12 DIAGNOSIS — R2681 Unsteadiness on feet: Secondary | ICD-10-CM

## 2021-06-12 DIAGNOSIS — I63511 Cerebral infarction due to unspecified occlusion or stenosis of right middle cerebral artery: Secondary | ICD-10-CM

## 2021-06-12 DIAGNOSIS — R269 Unspecified abnormalities of gait and mobility: Secondary | ICD-10-CM

## 2021-06-12 DIAGNOSIS — R262 Difficulty in walking, not elsewhere classified: Secondary | ICD-10-CM

## 2021-06-12 NOTE — Therapy (Signed)
?[facility] ?[place], [place], [address] ?Phone: [phone]   Fax:  [phone] ? ?Physical Therapy Treatment ? ?Patient Details  ?Name: Teresa [name] [facility] ?MRN: [id number] ?Date of Birth: [birth year] ?Referring Provider (PT): [name], MD ? ? ?Encounter Date: 06/12/2021 ? ? PT End of Session - 06/12/21 0956   ? ? Visit Number 6   ? Number of Visits 25   ? Date for PT Re-Evaluation 08/16/21   ? PT Start Time 1016   ? PT Stop Time 1059   ? PT Time Calculation (min) 43 min   ? Equipment Utilized During Treatment Gait belt   ? Activity Tolerance Patient tolerated treatment well   ? Behavior During Therapy [facility] for tasks assessed/performed   ? ?  ?  ? ?  ? ? ?Past Medical History:  ?Diagnosis Date  ? Cancer [facility])   ? skin  ? Hypothyroidism   ? Raynaud's disease   ? ? ?Past Surgical History:  ?Procedure Laterality Date  ? ABDOMINAL HYSTERECTOMY  1991  ? APPENDECTOMY  1991  ? BACK SURGERY  2010  ? Cervical fusion C4-5-6-7  ? CATARACT EXTRACTION, BILATERAL Bilateral 10/2016  ? CHOLECYSTECTOMY  1995  ? COLONOSCOPY WITH PROPOFOL N/A 12/08/2018  ? Procedure: COLONOSCOPY WITH PROPOFOL;  Surgeon: Toledo, Benay Pike, MD;  Location: [facility] ENDOSCOPY;  Service: Gastroenterology;  Laterality: N/A;  ? EYE SURGERY    ? ? ?There were no vitals filed for this visit. ? ? Subjective Assessment - 06/12/21 0942   ? ? Subjective "I'm dragging my left LE today."   ? Pertinent History Pt is a 81 y.o. 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 Pt completed CIR from 03/02/21-03/22/21. PMH includes: skin cancer, hypothyroidism, and Raynaud's disease, history of cervical fusion in 2010 C4-C7.  Pt reports HH PT/OT/SLP was performed from January 2nd to February 28th with focus on ADL completion, ambulation. Pt's big goal tranisitioning to ambulating up to 3 miles a day which pt was doing prior to stroke. Pt has been ambulating in [employer] with husband but last day  or two pt has noticed increased L foot drag but states it is better today. Pt denies any falls at home or community. Pt reports balance deficits with turns primarily. States her balance is good with her quad cane. Wishing to return to full independence in ambulation with household and community ambulation. Wishing to be able to asc/desc full flight of stairs with ability to carry decorative items and other household items without need for handrail and/or AD.   ? Limitations Lifting;Standing;Walking;House hold activities   ? How long can you sit comfortably? unlimited   ? How long can you stand comfortably? unlimited   ? How long can you walk comfortably? ~ 1.5 hours   ? Patient Stated Goals Return to independent walking for 3 miles. Return to to crochet.   ? Currently in Pain? No/denies   ? Pain Onset --   ? ?  ?  ? ?  ? ? ?INTERVENTIONS:  ? ?Neuromuscular re-ed:  ? ?Dynamic marching on blue airex pad x 2 min with min  UE support.  ? ?Dynamic squat on airex pad with light UE touch x 15 reps.  ? ?Tandem stand on balance beam x 30 sec x 3 trials (using ankle righting reaction with no LOB)  ?Tandem walk on blue airex pad - forward/backward x length of beam (intermittent use of  UE for support)  ? ?Static stand on 1/2 foam x 20 sec hold x 3 ?Dynamic side step on 1/2 foam x length of foam and back x 10 ? ?Modified lunge squat from airex pad to green therapad x 15 reps each leg ? ?Heel to toe sequencing using RTB around left ankle x 15 reps.  ? ?Biodex gait trainer 3 min- time on each foot = r= 43%; L=57% ?Step length= 0.32 m on Right and 0.37 m and gait velocity= 0.33 m/s ? ?Education provided throughout session via VC/TC and demonstration to facilitate movement at target joints and correct muscle activation for all testing and exercises performed.  ? ? ? ? ? ? ? ? ? ? ? ? ? ? ? ? ? ? ? ? ? ? ? ? PT Education - 06/12/21 0946   ? ? Education Details exercise technique   ? Person(s) Educated Patient   ? Methods  Explanation;Demonstration;Tactile cues;Verbal cues   ? Comprehension Verbalized understanding;Returned demonstration;Verbal cues required;Tactile cues required;Need further instruction   ? ?  ?  ? ?  ? ? ? PT Short Term Goals - 05/24/21 0932   ? ?  ? PT SHORT TERM GOAL #1  ? Title Pt will be indep with HEP to improve balance, strength, and gait to optimize independence with ADL completion   ? Baseline 05/24/21: Initiated   ? Time 6   ? Period Weeks   ? Status New   ? Target Date 07/05/21   ? ?  ?  ? ?  ? ? ? ? PT Long Term Goals - 05/24/21 0933   ? ?  ? PT LONG TERM GOAL #1  ? Title Pt will improve FOTO to target score to demonstrate clinically significant improvement in functional mobility   ? Baseline 05/24/21: next session   ? Time 12   ? Period Weeks   ? Status New   ? Target Date 08/02/21   ?  ? PT LONG TERM GOAL #2  ? Title Pt will improve 5xSTS to 12 sec or less to indicate clinically significant improvement in LE strength   ? Baseline 05/24/21: 17.51 sec   ? Time 12   ? Period Weeks   ? Status New   ? Target Date 08/16/21   ?  ? PT LONG TERM GOAL #3  ? Title Pt will improve 6 MWT by 165' to indicate clinically significant improvement in community ambulation distance for geriatric stroke norms.   ? Baseline 05/24/21: 765'   ? Time 12   ? Period Weeks   ? Status New   ? Target Date 08/16/21   ?  ? PT LONG TERM GOAL #4  ? Title Pt will improve 10 m gait speed to at least 1.0 m/s to demonstrate reduced risk of falls with community ambulation tasks.   ? Baseline 05/24/21: .63 m/s without AD   ? Time 12   ? Period Weeks   ? Status New   ? Target Date 08/16/21   ?  ? PT LONG TERM GOAL #5  ? Title Pt will improve FGA  by at least 5 point to demonstrate clinically signifcant reduced risk of falls with dynamic balance/ambulation tasks.   ? Baseline 05/24/21: 17   ? Time 12   ? Period Weeks   ? Status New   ? Target Date 08/16/21   ? ?  ?  ? ?  ? ? ? ? ? ? ? ? Plan - 06/12/21  0956   ? ? Clinical Impression Statement Patient  presents with good motivation today- Able to progress her balance with more dynamic activity. She was challenged with standing on foam and later 1/2 foam yet adapted well and decreased unsteadiness with practice. She required excessive VC to take a longer step with standing and only fair carryover with VC on gait trainer today. The pt will benefit from further skilled PT to improve strength, balance and mobility to decrease fall risk.   ? Personal Factors and Comorbidities Age;Time since onset of injury/illness/exacerbation;Comorbidity 3+;Past/Current Experience   ? Examination-Activity Limitations Squat;Stairs;Locomotion Level;Stand   ? Examination-Participation Restrictions Shop;Community Activity;[employer] Work   ? Stability/Clinical Decision Making Evolving/Moderate complexity   ? Rehab Potential Good   ? PT Frequency 2x / week   ? PT Duration 12 weeks   ? PT Treatment/Interventions ADLs/Self Care Home Management;Canalith Repostioning;Electrical Stimulation;DME Instruction;Gait training;Stair training;Functional mobility training;Therapeutic activities;Patient/family education;Therapeutic exercise;Balance training;Neuromuscular re-education;Energy conservation;Vestibular;Passive range of motion   ? PT Next Visit Plan FOTO: Progress LE strength, Gait training - heel to toe sequencing, progress balance as appropriate.   ? PT Home Exercise Plan Seated L ankle DF; 3/10 Access Code: [email or web address]   ? Consulted and Agree with Plan of Care Patient   ? ?  ?  ? ?  ? ? ?Patient will benefit from skilled therapeutic intervention in order to improve the following deficits and impairments:  Abnormal gait, Pain, Decreased mobility, Decreased activity tolerance, Decreased endurance, Decreased strength, Decreased balance, Difficulty walking ? ?Visit Diagnosis: ?Abnormality of gait and mobility ? ?Difficulty in walking, not elsewhere classified ? ?Muscle weakness (generalized) ? ?Other abnormalities of gait and mobility ? ?Other lack of  coordination ? ?Unsteadiness on feet ? ?Right middle cerebral artery stroke [facility]) ? ? ? ? ?Problem List ?Patient Active Problem List  ? Diagnosis Date Noted  ? Right middle cerebral artery stroke ([place]) 03/01/2021  ? Str

## 2021-06-14 ENCOUNTER — Ambulatory Visit: Payer: Medicare PPO | Admitting: Physical Therapy

## 2021-06-14 ENCOUNTER — Encounter: Payer: Self-pay | Admitting: Physical Therapy

## 2021-06-14 ENCOUNTER — Other Ambulatory Visit: Payer: Self-pay

## 2021-06-14 ENCOUNTER — Ambulatory Visit: Payer: Medicare PPO

## 2021-06-14 DIAGNOSIS — R269 Unspecified abnormalities of gait and mobility: Secondary | ICD-10-CM

## 2021-06-14 DIAGNOSIS — M6281 Muscle weakness (generalized): Secondary | ICD-10-CM | POA: Diagnosis not present

## 2021-06-14 DIAGNOSIS — R278 Other lack of coordination: Secondary | ICD-10-CM

## 2021-06-14 DIAGNOSIS — R2681 Unsteadiness on feet: Secondary | ICD-10-CM

## 2021-06-14 DIAGNOSIS — R482 Apraxia: Secondary | ICD-10-CM

## 2021-06-14 DIAGNOSIS — R2689 Other abnormalities of gait and mobility: Secondary | ICD-10-CM

## 2021-06-14 DIAGNOSIS — R262 Difficulty in walking, not elsewhere classified: Secondary | ICD-10-CM

## 2021-06-14 NOTE — Patient Instructions (Signed)
?  Access Code: [email or web address] ?URL: [email or web address] ?Date: 06/14/2021 ?Prepared by: Blanche East ? ?Exercises ?- Single Leg Stance  - 1 x daily - 3 x weekly - 3 sets - 5 reps - 5-10 sec hold ?- Tandem Stance  - 1 x daily - 3 x weekly - 3 sets - 5 reps - 20 sec hold ?- Staggered Sit-to-Stand  - 1 x daily - 3 x weekly - 2 sets - 10 reps ?- [place]oca with Unilateral Counter Support  - 1 x daily - 3 x weekly - 1 sets - 5 reps ? ?

## 2021-06-14 NOTE — Therapy (Signed)
[place] [facility] ?[place], [place], [address] ?Phone: [phone]   Fax:  [phone] ? ?Occupational Therapy Treatment ? ?Patient Details  ?Name: Teresa [name] [facility] ?MRN: [id number] ?Date of Birth: 06/06/1940 ?No data recorded ? ?Encounter Date: 06/14/2021 ? ? OT End of Session - 06/14/21 1634   ? ? Visit Number 6   ? Number of Visits 24   ? Date for OT Re-Evaluation 08/18/21   ? OT Start Time 0930   ? OT Stop Time 1015   ? OT Time Calculation (min) 45 min   ? Activity Tolerance Patient tolerated treatment well   ? Behavior During Therapy [facility] for tasks assessed/performed   ? ?  ?  ? ?  ? ? ?Past Medical History:  ?Diagnosis Date  ? Cancer [facility])   ? skin  ? Hypothyroidism   ? Raynaud's disease   ? ? ?Past Surgical History:  ?Procedure Laterality Date  ? ABDOMINAL HYSTERECTOMY  1991  ? APPENDECTOMY  1991  ? BACK SURGERY  2010  ? Cervical fusion C4-5-6-7  ? CATARACT EXTRACTION, BILATERAL Bilateral 10/2016  ? CHOLECYSTECTOMY  1995  ? COLONOSCOPY WITH PROPOFOL N/A 12/08/2018  ? Procedure: COLONOSCOPY WITH PROPOFOL;  Surgeon: Toledo, Benay Pike, MD;  Location: [facility] ENDOSCOPY;  Service: Gastroenterology;  Laterality: N/A;  ? EYE SURGERY    ? ? ?There were no vitals filed for this visit. ? ? Subjective Assessment - 06/13/21 1631   ? ? Subjective  Pt reports she was able to start grasping items without thinking the other day (hair brush) using L hand.   ? Pertinent History February 26, 2021, pt reports she had a CVA, came to [facility] to ER and then was transferred to [facility] in [place] where she was admitted and after acute care she went to inpatient rehab.  Following inpt rehab, pt went home and had home health.   ? Patient Stated Goals Pt reports her goal is to get back to normal, be as independent as possible.   ? Currently in Pain? Yes   ? Pain Score 2    ? Pain Location Hand   ? Pain Orientation Left   ? Pain Descriptors / Indicators  Aching;Sore;Tightness   ? Pain Type Chronic pain   ? Pain Radiating Towards shoulder to hand   ? Pain Onset More than a month ago   ? Pain Frequency Intermittent   ? Aggravating Factors  activity, arm in a dependent position   ? Pain Relieving Factors rest, gentle ROM, heat   ? Effect of Pain on Daily Activities pt guards LUE during activity and transfers   ? Multiple Pain Sites No   ? ?  ?  ? ?  ? ?Occupational Therapy Treatment: ?Therapeutic Exercise: ?Pt participated in sitting UBE x 4 min forward rotation x 2 min reverse rotation, working to increase L shoulder mobility and provide reciprocal movement for LUE.  Ace wrap used to secure L hand to pedal d/t weak grip, and constant monitoring provided to intermittent support LUE during UBE tension adjustments; tension decreased for reverse rotation.  In sitting, performed passive stretching to L wrist flex/ext, and flex/ext of digits 1-5, each joint isolated for flexion in each digit.  Instructed pt in self passive stretching as noted above with good return demo.  Performed digit abd/add and hand tapping on table to facilitate L wrist ext x2 sets 10 reps each.  Vc needed to engage L thumb  into abduction during these reps.  Performed active assisted fisting and extension of all L hand digits x10 each.  ? ?Neuro re-ed: ?Facilitated grasp/release of small balls on table top using L hand.  Pt required sequencing cues to increase accuracy.  Pt 50% accurate to pick up and place golf ball into container over multiple trials, and >75% accurate with similar size balls that were lighter in weight.   ? ?Response to Treatment: ?See Plan/clinical impression below. ? ? ? ? OT Education - 06/14/21 1633   ? ? Education Details self ROM of L wrist and hand, grasp/release exercises   ? Person(s) Educated Patient   ? Methods Explanation;Demonstration;Tactile cues;Verbal cues   ? Comprehension Verbalized understanding;Returned demonstration;Verbal cues required;Need further  instruction;Tactile cues required   ? ?  ?  ? ?  ? ? ? ? ? ? OT Long Term Goals - 05/26/21 1730   ? ?  ? OT LONG TERM GOAL #1  ? Title Pt will be independent with home exercise program.   ? Baseline no current program   ? Time 12   ? Period Weeks   ? Status New   ? Target Date 08/18/21   ?  ? OT LONG TERM GOAL #2  ? Title Pt will complete UB and LB dressing with modified independence including buttons, snaps and zippers.   ? Baseline requires min assist at eval   ? Time 12   ? Period Weeks   ? Status New   ? Target Date 08/18/21   ?  ? OT LONG TERM GOAL #3  ? Title Pt will perform shower transfer with modified independence.   ? Baseline Pt requires supervision to min assist for shower transfer at home.   ? Time 6   ? Period Weeks   ? Status New   ? Target Date 07/07/21   ?  ? OT LONG TERM GOAL #4  ? Title Pt will improve L hand grip by 10# to assist with holding items in left hand securely.   ? Baseline no grip in left hand at eval   ? Time 12   ? Period Weeks   ? Status New   ? Target Date 08/18/21   ?  ? OT LONG TERM GOAL #5  ? Title Pt will improve left shoulder flexion by 10 degrees to improve reaching to obtain self care items from shelf/shoulder height.   ? Baseline difficulty with reach, shoulder flexion to 47 degrees.   ? Time 12   ? Period Weeks   ? Status New   ? Target Date 07/07/21   ?  ? Long Term Additional Goals  ? Additional Long Term Goals Yes   ?  ? OT LONG TERM GOAL #6  ? Title Pt will improve FOTO score to 47 or above to demonstrate a clinically relevant change in function to impact ADL tasks.   ? Baseline score of 30 at eval   ? Time 12   ? Period Weeks   ? Status New   ? Target Date 08/18/21   ?  ? OT LONG TERM GOAL #7  ? Title Pt will demonstrate ability to pick up small objects and complete 9 hole peg test in less than 2 mins.   ? Baseline unable to perform at eval   ? Time 12   ? Period Weeks   ? Status New   ? Target Date 08/18/21   ? ?  ?  ? ?  ? ? ?  Plan - 06/14/21 1647   ? ? Clinical  Impression Statement Pt stated she just started to be able to grasp some things at home since yesterday, and reports picking up her hair brush yesterday without thinking using L hand.  Focussed on slow passive stretching and AAROM to L hand this day in prep for grasp/release activities.  Pt 50% accurate to pick up and place golf ball into container over multiple trials, and >75% accurate with similar size balls that were lighter in weight.  Pt tolerated UBE well without pain in shoulder. Pt continues to progress with LUE AROM and functional use.  Pt will continue to benefit from skilled OT to increase LUE strength and coordination and manage edema and stiffness in L hand, working to increase engagement of LUE into daily tasks.   ? OT Occupational Profile and History Detailed Assessment- Review of Records and additional review of physical, cognitive, psychosocial history related to current functional performance   ? Occupational performance deficits (Please refer to evaluation for details): ADL's;IADL's;Leisure;Rest and Sleep   ? Body Structure / Function / Physical Skills ADL;Coordination;Endurance;GMC;UE functional use;Balance;IADL;Pain;Dexterity;FMC;Strength;Edema;Mobility;ROM   ? Psychosocial Skills Environmental  Adaptations;Habits;Routines and Behaviors   ? Rehab Potential Good   ? Clinical Decision Making Several treatment options, min-mod task modification necessary   ? Comorbidities Affecting Occupational Performance: May have comorbidities impacting occupational performance   ? Modification or Assistance to Complete Evaluation  Min-Moderate modification of tasks or assist with assess necessary to complete eval   ? OT Frequency 2x / week   ? OT Duration 12 weeks   ? OT Treatment/Interventions Self-care/ADL training;Cryotherapy;Paraffin;Therapeutic exercise;DME and/or AE instruction;Functional Mobility Training;Balance training;Electrical Stimulation;Ultrasound;Neuromuscular education;Manual  Therapy;Splinting;Moist Heat;Contrast Bath;Passive range of motion;Therapeutic activities;Patient/family education;Coping strategies training   ? Consulted and Agree with Plan of Care Patient   ? ?  ?  ? ?  ? ? ?Patient will benefit from skilled

## 2021-06-14 NOTE — Therapy (Signed)
?[facility] ?[place], [place], [address] ?Phone: [phone]   Fax:  [phone] ? ?Physical Therapy Treatment ? ?Patient Details  ?Name: Teresa [name] [facility] ?MRN: [id number] ?Date of Birth: 1940-06-07 ?Referring Provider (PT): [name], MD ? ? ?Encounter Date: 06/14/2021 ? ? PT End of Session - 06/14/21 1025   ? ? Visit Number 7   ? Number of Visits 25   ? Date for PT Re-Evaluation 08/16/21   ? PT Start Time 1018   ? PT Stop Time 1100   ? PT Time Calculation (min) 42 min   ? Equipment Utilized During Treatment Gait belt   ? Activity Tolerance Patient tolerated treatment well   ? Behavior During Therapy [facility] for tasks assessed/performed   ? ?  ?  ? ?  ? ? ?Past Medical History:  ?Diagnosis Date  ? Cancer [facility])   ? skin  ? Hypothyroidism   ? Raynaud's disease   ? ? ?Past Surgical History:  ?Procedure Laterality Date  ? ABDOMINAL HYSTERECTOMY  1991  ? APPENDECTOMY  1991  ? BACK SURGERY  2010  ? Cervical fusion C4-5-6-7  ? CATARACT EXTRACTION, BILATERAL Bilateral 10/2016  ? CHOLECYSTECTOMY  1995  ? COLONOSCOPY WITH PROPOFOL N/A 12/08/2018  ? Procedure: COLONOSCOPY WITH PROPOFOL;  Surgeon: Toledo, Benay Pike, MD;  Location: [facility] ENDOSCOPY;  Service: Gastroenterology;  Laterality: N/A;  ? EYE SURGERY    ? ? ?There were no vitals filed for this visit. ? ? Subjective Assessment - 06/14/21 1023   ? ? Subjective Patient reports doing okay today. She reports walking around her home without AD but she does bump her arm into the chair rails. She denies any soreness. She denies any new falls. She reports doing HEP every day. She reports "They are getting easy."   ? Pertinent History Pt is a 81 y.o. 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 Pt completed CIR from 03/02/21-03/22/21. PMH includes: skin cancer, hypothyroidism, and Raynaud's disease, history of cervical fusion in 2010 C4-C7.  Pt reports HH PT/OT/SLP was performed from January  2nd to February 28th with focus on ADL completion, ambulation. Pt's big goal tranisitioning to ambulating up to 3 miles a day which pt was doing prior to stroke. Pt has been ambulating in [employer] with husband but last day or two pt has noticed increased L foot drag but states it is better today. Pt denies any falls at home or community. Pt reports balance deficits with turns primarily. States her balance is good with her quad cane. Wishing to return to full independence in ambulation with household and community ambulation. Wishing to be able to asc/desc full flight of stairs with ability to carry decorative items and other household items without need for handrail and/or AD.   ? Limitations Lifting;Standing;Walking;House hold activities   ? How long can you sit comfortably? unlimited   ? How long can you stand comfortably? unlimited   ? How long can you walk comfortably? ~ 1.5 hours   ? Patient Stated Goals Return to independent walking for 3 miles. Return to to crochet.   ? ?  ?  ? ?  ? ? ? ?INTERVENTIONS:  ? ?Exercise: ?Standing at steps: ?Hip flexion against green tband tapping foot to 3rd step x15 reps each LE; ? ?Standing by support surface: ?4# ankle weight: ?-BLE heel/toe raises x15 reps with cues to keep knee extended for better calf activation; ?-hip  abduction SLR x15 reps each LE with cues to keep hip oriented to neutral for better abductor strengthening;  ? ?Stepping over orange hurdle 4# x5 reps unsupported, exhibits increased unsteadiness when stepping over with LLE due to impaired coordination; ? ?Forward step weight shift to BOSU x10 reps each LE unsupported, 4# ankle weight, required CGA to min A when stepping with LLE, less instability with RLE;  ?  ?Neuromuscular re-ed:  ?  ?Standing on airex pad: ?-feet together: ?Unsupported standing, 30 sec hold x1 rep, no instability; ?Head turns side/side x5 reps, no instability ?Head turns up/down x5 reps, no instability; ?Trunk rotation x5 reps with good motor  control and balance; ?Forward/backward step airex to airex x5 reps unsupported ?Standing one foot on airex, other foot on 2nd airex, staggered stance: ?Unsupported x20 sec ?Progressed to head turns side/side x5 reps;  ?  ?Dynamic squat on airex pad unsupported x3-4 with good stability;  ?  ?Instructed patient in grapevine activity, crossing in front/behind with UE rail assist, moderate difficulty reported with feet drag and having trouble with foot clearance.  ? ?  ?Education provided throughout session via VC/TC and demonstration to facilitate movement at target joints and correct muscle activation for all testing and exercises performed.  ? ? ?Advanced HEP- see patient instructions;  ? ? ? ? ? ? ? ? ? ? ? ? ? ? ? PT Education - 06/14/21 1024   ? ? Education Details exercise technique/positioning;   ? Person(s) Educated Patient   ? Methods Explanation;Verbal cues   ? Comprehension Verbalized understanding;Returned demonstration;Verbal cues required;Need further instruction   ? ?  ?  ? ?  ? ? ? PT Short Term Goals - 05/24/21 0932   ? ?  ? PT SHORT TERM GOAL #1  ? Title Pt will be indep with HEP to improve balance, strength, and gait to optimize independence with ADL completion   ? Baseline 05/24/21: Initiated   ? Time 6   ? Period Weeks   ? Status New   ? Target Date 07/05/21   ? ?  ?  ? ?  ? ? ? ? PT Long Term Goals - 05/24/21 0933   ? ?  ? PT LONG TERM GOAL #1  ? Title Pt will improve FOTO to target score to demonstrate clinically significant improvement in functional mobility   ? Baseline 05/24/21: next session   ? Time 12   ? Period Weeks   ? Status New   ? Target Date 08/02/21   ?  ? PT LONG TERM GOAL #2  ? Title Pt will improve 5xSTS to 12 sec or less to indicate clinically significant improvement in LE strength   ? Baseline 05/24/21: 17.51 sec   ? Time 12   ? Period Weeks   ? Status New   ? Target Date 08/16/21   ?  ? PT LONG TERM GOAL #3  ? Title Pt will improve 6 MWT by 165' to indicate clinically significant  improvement in community ambulation distance for geriatric stroke norms.   ? Baseline 05/24/21: 765'   ? Time 12   ? Period Weeks   ? Status New   ? Target Date 08/16/21   ?  ? PT LONG TERM GOAL #4  ? Title Pt will improve 10 m gait speed to at least 1.0 m/s to demonstrate reduced risk of falls with community ambulation tasks.   ? Baseline 05/24/21: .63 m/s without AD   ? Time 12   ?  Period Weeks   ? Status New   ? Target Date 08/16/21   ?  ? PT LONG TERM GOAL #5  ? Title Pt will improve FGA  by at least 5 point to demonstrate clinically signifcant reduced risk of falls with dynamic balance/ambulation tasks.   ? Baseline 05/24/21: 17   ? Time 12   ? Period Weeks   ? Status New   ? Target Date 08/16/21   ? ?  ?  ? ?  ? ? ? ? ? ? ? ? Plan - 06/14/21 1511   ? ? Clinical Impression Statement Patient motivated and participated well within session. She was instructed in advanced LE strengthening exercise. She does require cues for proper positioning and to slow down LE movement for optimal strengthening. Patient reports exercises were "easy" but exhibits compensation due to weakness requiring cues to correct positioning for optimal muscle activation. She was instructed in advanced balance tasks utilizing compliant surface to challenge stance control. she does require CGA to min A with reduced rail assist. Instructed patient in grapevine exercise to challenge coordination and stance control. She reports moderate difficulty often dragging LLE having trouble with foot placement. Added to HEP with cues to hold onto counter for balance. She would benefit from additional skilled PT Intervention to improve strength, balance and gait safety;   ? Personal Factors and Comorbidities Age;Time since onset of injury/illness/exacerbation;Comorbidity 3+;Past/Current Experience   ? Examination-Activity Limitations Squat;Stairs;Locomotion Level;Stand   ? Examination-Participation Restrictions Shop;Community Activity;[employer] Work   ?  Stability/Clinical Decision Making Evolving/Moderate complexity   ? Rehab Potential Good   ? PT Frequency 2x / week   ? PT Duration 12 weeks   ? PT Treatment/Interventions ADLs/Self Care Home Management;Canalith Reposti

## 2021-06-16 ENCOUNTER — Encounter: Payer: Self-pay | Admitting: Occupational Therapy

## 2021-06-16 NOTE — Therapy (Signed)
?[facility] ?[place], [place], [address] ?Phone: [phone]   Fax:  [phone] ? ?Occupational Therapy Treatment ? ?Patient Details  ?Name: Teresa [name] ?MRN: [id number] ?Date of Birth: 1940/11/29 ?No data recorded ? ?Encounter Date: 06/12/2021 ? ? OT End of Session - 06/15/21 1437   ? ? Visit Number 6   ? Number of Visits 24   ? Date for OT Re-Evaluation 08/18/21   ? OT Start Time [phone]   ? OT Stop Time 1015   ? OT Time Calculation (min) 46 min   ? Activity Tolerance Patient tolerated treatment well   ? Behavior During Therapy [facility] for tasks assessed/performed   ? ?  ?  ? ?  ? ? ?Past Medical History:  ?Diagnosis Date  ? Cancer [facility])   ? skin  ? Hypothyroidism   ? Raynaud's disease   ? ? ?Past Surgical History:  ?Procedure Laterality Date  ? ABDOMINAL HYSTERECTOMY  1991  ? APPENDECTOMY  1991  ? BACK SURGERY  2010  ? Cervical fusion C4-5-6-7  ? CATARACT EXTRACTION, BILATERAL Bilateral 10/2016  ? CHOLECYSTECTOMY  1995  ? COLONOSCOPY WITH PROPOFOL N/A 12/08/2018  ? Procedure: COLONOSCOPY WITH PROPOFOL;  Surgeon: Toledo, Benay Pike, MD;  Location: [facility] ENDOSCOPY;  Service: Gastroenterology;  Laterality: N/A;  ? EYE SURGERY    ? ? ?There were no vitals filed for this visit. ? ? Subjective Assessment - 06/15/21 1434   ? ? Subjective  Pt reports she was able to start grasping items without thinking the other day (hair brush) using L hand.   ? Pertinent History February 26, 2021, pt reports she had a CVA, came to [facility] to ER and then was transferred to [facility] in [place] where she was admitted and after acute care she went to inpatient rehab.  Following inpt rehab, pt went home and had home health.   ? Patient Stated Goals Pt reports her goal is to get back to normal, be as independent as possible.   ? Currently in Pain? Yes   ? Pain Score 2    ? Pain Location Hand   ? Pain Orientation Left   ? Pain Descriptors / Indicators  Aching;Tightness;Sore   ? Pain Type Chronic pain   ? Pain Onset More than a month ago   ? Pain Frequency Intermittent   ? ?  ?  ? ?  ? ?Pt seen for therapeutic exercises in sitting, Shoulder ROM for shoulder flexion, ABD, elbow flexion/extension, supination/pronation, wrist flexion/extension, digit flexion and extension.  Pt seen for PROM for scapular elevation, retraction and upwards rotation followed by facilitation of A/AAROM for same motions.  Pt performing table slides with use of towel under hand for forward flexion, ABD, diagonal patterns and then circular patterns to simulate table wiping.   ?Gross grasp and release of [place] discs, continues to demonstrate difficulty with  isolated finger movements and unable to turn and flip.   ? ?Focus on Isolated finger index and middle, difficulty with ring and small.  Thumb ABD ? range, tight web space, manual stretch by therapist for web space to improve motion of thumb and fingers for use in grasping and hand function activities.  ? ?Response to tx: ?Pt continues to progress, she is demonstrating improved grasping, initiation of some isolated finger movements.  Decreased pain noted in the shoulder and improved reaching during tasks in session.  She reports improved use for brushing her hair  and starting to use hand more spontaneously.  Overall reduction in edema this session.  Continue to work towards goals in plan of care to maximize safety and independence in necessary daily tasks.  ? ? ? ? ? ? ? ? ? ? ? ? ? ? ? ? ? ? ? ? ? OT Education - 06/15/21 1436   ? ? Education Details self ROM of L wrist and hand, grasp/release exercises   ? Person(s) Educated Patient   ? Methods Explanation;Demonstration;Tactile cues;Verbal cues   ? Comprehension Verbalized understanding;Returned demonstration;Verbal cues required;Need further instruction;Tactile cues required   ? ?  ?  ? ?  ? ? ? ? ? ? OT Long Term Goals - 05/26/21 1730   ? ?  ? OT LONG TERM GOAL #1  ? Title Pt will be  independent with home exercise program.   ? Baseline no current program   ? Time 12   ? Period Weeks   ? Status New   ? Target Date 08/18/21   ?  ? OT LONG TERM GOAL #2  ? Title Pt will complete UB and LB dressing with modified independence including buttons, snaps and zippers.   ? Baseline requires min assist at eval   ? Time 12   ? Period Weeks   ? Status New   ? Target Date 08/18/21   ?  ? OT LONG TERM GOAL #3  ? Title Pt will perform shower transfer with modified independence.   ? Baseline Pt requires supervision to min assist for shower transfer at home.   ? Time 6   ? Period Weeks   ? Status New   ? Target Date 07/07/21   ?  ? OT LONG TERM GOAL #4  ? Title Pt will improve L hand grip by 10# to assist with holding items in left hand securely.   ? Baseline no grip in left hand at eval   ? Time 12   ? Period Weeks   ? Status New   ? Target Date 08/18/21   ?  ? OT LONG TERM GOAL #5  ? Title Pt will improve left shoulder flexion by 10 degrees to improve reaching to obtain self care items from shelf/shoulder height.   ? Baseline difficulty with reach, shoulder flexion to 47 degrees.   ? Time 12   ? Period Weeks   ? Status New   ? Target Date 07/07/21   ?  ? Long Term Additional Goals  ? Additional Long Term Goals Yes   ?  ? OT LONG TERM GOAL #6  ? Title Pt will improve FOTO score to 47 or above to demonstrate a clinically relevant change in function to impact ADL tasks.   ? Baseline score of 30 at eval   ? Time 12   ? Period Weeks   ? Status New   ? Target Date 08/18/21   ?  ? OT LONG TERM GOAL #7  ? Title Pt will demonstrate ability to pick up small objects and complete 9 hole peg test in less than 2 mins.   ? Baseline unable to perform at eval   ? Time 12   ? Period Weeks   ? Status New   ? Target Date 08/18/21   ? ?  ?  ? ?  ? ? ? ? ? ? ? ? Plan - 06/15/21 1438   ? ? Clinical Impression Statement Pt continues to progress, she is demonstrating improved grasping, initiation  of some isolated finger movements.   Decreased pain noted in the shoulder and improved reaching during tasks in session.  She reports improved use for brushing her hair and starting to use hand more spontaneously.  Overall reduction in edema this session.  Continue to work towards goals in plan of care to maximize safety and independence in necessary daily tasks.   ? OT Occupational Profile and History Detailed Assessment- Review of Records and additional review of physical, cognitive, psychosocial history related to current functional performance   ? Occupational performance deficits (Please refer to evaluation for details): ADL's;IADL's;Leisure;Rest and Sleep   ? Body Structure / Function / Physical Skills ADL;Coordination;Endurance;GMC;UE functional use;Balance;IADL;Pain;Dexterity;FMC;Strength;Edema;Mobility;ROM   ? Psychosocial Skills Environmental  Adaptations;Habits;Routines and Behaviors   ? Rehab Potential Good   ? Clinical Decision Making Several treatment options, min-mod task modification necessary   ? Comorbidities Affecting Occupational Performance: May have comorbidities impacting occupational performance   ? Modification or Assistance to Complete Evaluation  Min-Moderate modification of tasks or assist with assess necessary to complete eval   ? OT Frequency 2x / week   ? OT Duration 12 weeks   ? OT Treatment/Interventions Self-care/ADL training;Cryotherapy;Paraffin;Therapeutic exercise;DME and/or AE instruction;Functional Mobility Training;Balance training;Electrical Stimulation;Ultrasound;Neuromuscular education;Manual Therapy;Splinting;Moist Heat;Contrast Bath;Passive range of motion;Therapeutic activities;Patient/family education;Coping strategies training   ? Consulted and Agree with Plan of Care Patient   ? ?  ?  ? ?  ? ? ?Patient will benefit from skilled therapeutic intervention in order to improve the following deficits and impairments:   ?Body Structure / Function / Physical Skills: ADL, Coordination, Endurance, GMC, UE  functional use, Balance, IADL, Pain, Dexterity, FMC, Strength, Edema, Mobility, ROM ?  ?Psychosocial Skills: Environmental  Adaptations, Habits, Routines and Behaviors ? ? ?Visit Diagnosis: ?Muscle weakness (generalized) ? ?Ot

## 2021-06-19 ENCOUNTER — Ambulatory Visit: Payer: Medicare PPO | Admitting: Occupational Therapy

## 2021-06-19 ENCOUNTER — Encounter: Payer: Self-pay | Admitting: Occupational Therapy

## 2021-06-19 ENCOUNTER — Other Ambulatory Visit: Payer: Self-pay

## 2021-06-19 DIAGNOSIS — R278 Other lack of coordination: Secondary | ICD-10-CM

## 2021-06-19 DIAGNOSIS — G8929 Other chronic pain: Secondary | ICD-10-CM

## 2021-06-19 DIAGNOSIS — M6281 Muscle weakness (generalized): Secondary | ICD-10-CM

## 2021-06-21 ENCOUNTER — Ambulatory Visit: Payer: Medicare PPO

## 2021-06-21 ENCOUNTER — Other Ambulatory Visit: Payer: Self-pay | Admitting: Internal Medicine

## 2021-06-21 DIAGNOSIS — R482 Apraxia: Secondary | ICD-10-CM

## 2021-06-21 DIAGNOSIS — M6281 Muscle weakness (generalized): Secondary | ICD-10-CM

## 2021-06-21 DIAGNOSIS — R262 Difficulty in walking, not elsewhere classified: Secondary | ICD-10-CM

## 2021-06-21 DIAGNOSIS — R278 Other lack of coordination: Secondary | ICD-10-CM

## 2021-06-21 DIAGNOSIS — R269 Unspecified abnormalities of gait and mobility: Secondary | ICD-10-CM

## 2021-06-21 DIAGNOSIS — R2681 Unsteadiness on feet: Secondary | ICD-10-CM

## 2021-06-21 DIAGNOSIS — R2689 Other abnormalities of gait and mobility: Secondary | ICD-10-CM

## 2021-06-21 DIAGNOSIS — Z1231 Encounter for screening mammogram for malignant neoplasm of breast: Secondary | ICD-10-CM

## 2021-06-21 NOTE — Therapy (Signed)
[facility] ?[place], [place], [address] ?Phone: [phone]   Fax:  [phone] ? ?Physical Therapy Treatment ? ?Patient Details  ?Name: Teresa [name] ?MRN: [id number] ?Date of Birth: 05-Sep-1940 ?Referring Provider (PT): [name], MD ? ? ?Encounter Date: 06/21/2021 ? ? PT End of Session - 06/21/21 1352   ? ? Visit Number 8   ? Number of Visits 25   ? Date for PT Re-Evaluation 08/16/21   ? PT Start Time 1347   ? PT Stop Time 1430   ? PT Time Calculation (min) 43 min   ? Equipment Utilized During Treatment Gait belt   ? Activity Tolerance Patient tolerated treatment well   ? Behavior During Therapy [facility] for tasks assessed/performed   ? ?  ?  ? ?  ? ? ?Past Medical History:  ?Diagnosis Date  ? Cancer [facility])   ? skin  ? Hypothyroidism   ? Raynaud's disease   ? ? ?Past Surgical History:  ?Procedure Laterality Date  ? ABDOMINAL HYSTERECTOMY  1991  ? APPENDECTOMY  1991  ? BACK SURGERY  2010  ? Cervical fusion C4-5-6-7  ? CATARACT EXTRACTION, BILATERAL Bilateral 10/2016  ? CHOLECYSTECTOMY  1995  ? COLONOSCOPY WITH PROPOFOL N/A 12/08/2018  ? Procedure: COLONOSCOPY WITH PROPOFOL;  Surgeon: Toledo, Benay Pike, MD;  Location: [facility] ENDOSCOPY;  Service: Gastroenterology;  Laterality: N/A;  ? EYE SURGERY    ? ? ?There were no vitals filed for this visit. ? ? Subjective Assessment - 06/21/21 1349   ? ? Subjective Patient reports the MD has now ordered Speech Therapy due to some facial droop.   ? Pertinent History Pt is a 81 y.o. 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 Pt completed CIR from 03/02/21-03/22/21. PMH includes: skin cancer, hypothyroidism, and Raynaud's disease, history of cervical fusion in 2010 C4-C7.  Pt reports HH PT/OT/SLP was performed from January 2nd to February 28th with focus on ADL completion, ambulation. Pt's big goal tranisitioning to ambulating up to 3 miles a day which pt was doing prior to stroke. Pt has  been ambulating in [employer] with husband but last day or two pt has noticed increased L foot drag but states it is better today. Pt denies any falls at home or community. Pt reports balance deficits with turns primarily. States her balance is good with her quad cane. Wishing to return to full independence in ambulation with household and community ambulation. Wishing to be able to asc/desc full flight of stairs with ability to carry decorative items and other household items without need for handrail and/or AD.   ? Limitations Lifting;Standing;Walking;House hold activities   ? How long can you sit comfortably? unlimited   ? How long can you stand comfortably? unlimited   ? How long can you walk comfortably? ~ 1.5 hours   ? Patient Stated Goals Return to independent walking for 3 miles. Return to to crochet.   ? Currently in Pain? No/denies   ? ?  ?  ? ?  ? ? ? ? ?INTERVENTIONS:  ? ? ?Neuromuscular re-ed:  ? ?Dynamic lateral step up (left and right) onto airex pad- x 15 reps ? ?Rocker board - A/P - Initially trying to stand still and even on board - patient with posterior lean with reaching for UE support. She did improve overall with increased practice. Then progressed to ant/post lean- rocking onto toes/heels with 1 UE support.  ? ?  Rocker board - Lateral weight shift- x 20 reps without significant difficulty.  ? ?Standing with feet wide on airex pad ?Unsupported standing, 30 sec hold x1 rep, no instability; ?Head turns side/side x5 reps, no instability ?Head turns up/down x5 reps, no instability; ?Trunk rotation x5 reps with good motor control and balance; ? ?Steps- Exercise: ?Standing at steps: ?Hip flexion tapping foot to 3rd step x15 reps each LE ?Patient was instructed in walking up steps with use of left rail with feet positioned at 45 deg - She was able to hold rail with right arm and perform without difficulty.  ? ?Sit to stand without UE Support and airex pad under 1 foot x 10 reps then switch and perform 10  more with opp LE ? ?Gait in hallway x 80 feet without an AD x 2 focusing on erect posture and clearing left foot from floor.  ?Gait in hallway x 80 feet without an AD x 2 with horizontal head turns (calling out items on sticky note)  - No significant gait deviations ?Gait in hallway x 80 feet without an AD x 2 with vertical head turns - mild decreased cadence/speed yet no LOB or dizziness.  ? ?Education provided throughout session via VC/TC and demonstration to facilitate movement at target joints and correct muscle activation for all testing and exercises performed.  ? ? ? ? ? ? ? ? ? ? ? ? ? ? ? ? ? ? ? ? ? PT Education - 06/21/21 1352   ? ? Education Details Exercise technique   ? Person(s) Educated Patient   ? Methods Explanation;Demonstration;Tactile cues;Verbal cues   ? Comprehension Verbalized understanding;Returned demonstration;Verbal cues required;Tactile cues required;Need further instruction   ? ?  ?  ? ?  ? ? ? PT Short Term Goals - 05/24/21 0932   ? ?  ? PT SHORT TERM GOAL #1  ? Title Pt will be indep with HEP to improve balance, strength, and gait to optimize independence with ADL completion   ? Baseline 05/24/21: Initiated   ? Time 6   ? Period Weeks   ? Status New   ? Target Date 07/05/21   ? ?  ?  ? ?  ? ? ? ? PT Long Term Goals - 05/24/21 0933   ? ?  ? PT LONG TERM GOAL #1  ? Title Pt will improve FOTO to target score to demonstrate clinically significant improvement in functional mobility   ? Baseline 05/24/21: next session   ? Time 12   ? Period Weeks   ? Status New   ? Target Date 08/02/21   ?  ? PT LONG TERM GOAL #2  ? Title Pt will improve 5xSTS to 12 sec or less to indicate clinically significant improvement in LE strength   ? Baseline 05/24/21: 17.51 sec   ? Time 12   ? Period Weeks   ? Status New   ? Target Date 08/16/21   ?  ? PT LONG TERM GOAL #3  ? Title Pt will improve 6 MWT by 165' to indicate clinically significant improvement in community ambulation distance for geriatric stroke norms.    ? Baseline 05/24/21: 765'   ? Time 12   ? Period Weeks   ? Status New   ? Target Date 08/16/21   ?  ? PT LONG TERM GOAL #4  ? Title Pt will improve 10 m gait speed to at least 1.0 m/s to demonstrate reduced risk of falls with community  ambulation tasks.   ? Baseline 05/24/21: .63 m/s without AD   ? Time 12   ? Period Weeks   ? Status New   ? Target Date 08/16/21   ?  ? PT LONG TERM GOAL #5  ? Title Pt will improve FGA  by at least 5 point to demonstrate clinically signifcant reduced risk of falls with dynamic balance/ambulation tasks.   ? Baseline 05/24/21: 17   ? Time 12   ? Period Weeks   ? Status New   ? Target Date 08/16/21   ? ?  ?  ? ?  ? ? ? ? ? ? ? ? Plan - 06/21/21 1504   ? ? Clinical Impression Statement Patient presented today with good motivation. Patient was challenged some with balance - exhibiting posterior lean initially unable to self correct but did improve with practice. Patient was able to demonstrate improved ability to negotiate steps with VC's for technique. She was able to walk today without a cane with concentration on picking up left foot and no LOB with gait activities. The pt will benefit from further skilled PT to improve strength, balance and mobility to decrease fall risk   ? Personal Factors and Comorbidities Age;Time since onset of injury/illness/exacerbation;Comorbidity 3+;Past/Current Experience   ? Examination-Activity Limitations Squat;Stairs;Locomotion Level;Stand   ? Examination-Participation Restrictions Shop;Community Activity;[employer] Work   ? Stability/Clinical Decision Making Evolving/Moderate complexity   ? Rehab Potential Good   ? PT Frequency 2x / week   ? PT Duration 12 weeks   ? PT Treatment/Interventions ADLs/Self Care Home Management;Canalith Repostioning;Electrical Stimulation;DME Instruction;Gait training;Stair training;Functional mobility training;Therapeutic activities;Patient/family education;Therapeutic exercise;Balance training;Neuromuscular re-education;Energy  conservation;Vestibular;Passive range of motion   ? PT Next Visit Plan Progress LE strength, Gait training - heel to toe sequencing, progress balance as appropriate.   ? PT Home Exercise Plan Seated L ankle DF; 3

## 2021-06-21 NOTE — Therapy (Signed)
[facility] ?[place], [place], [address] ?Phone: [phone]   Fax:  [phone] ? ?Occupational Therapy Treatment ? ?Patient Details  ?Name: Teresa [name] ?MRN: [id number] ?Date of Birth: Dec 25, 1940 ?No data recorded ? ?Encounter Date: 06/19/2021 ? ? OT End of Session - 06/20/21 1143   ? ? Visit Number 8   ? Number of Visits 24   ? Date for OT Re-Evaluation 08/18/21   ? OT Start Time 1014   ? OT Stop Time 1100   ? OT Time Calculation (min) 46 min   ? Activity Tolerance Patient tolerated treatment well   ? Behavior During Therapy [facility] for tasks assessed/performed   ? ?  ?  ? ?  ? ? ?Past Medical History:  ?Diagnosis Date  ? Cancer [facility])   ? skin  ? Hypothyroidism   ? Raynaud's disease   ? ? ?Past Surgical History:  ?Procedure Laterality Date  ? ABDOMINAL HYSTERECTOMY  1991  ? APPENDECTOMY  1991  ? BACK SURGERY  2010  ? Cervical fusion C4-5-6-7  ? CATARACT EXTRACTION, BILATERAL Bilateral 10/2016  ? CHOLECYSTECTOMY  1995  ? COLONOSCOPY WITH PROPOFOL N/A 12/08/2018  ? Procedure: COLONOSCOPY WITH PROPOFOL;  Surgeon: Toledo, Benay Pike, MD;  Location: [facility] ENDOSCOPY;  Service: Gastroenterology;  Laterality: N/A;  ? EYE SURGERY    ? ? ?There were no vitals filed for this visit. ? ? Subjective Assessment - 06/20/21 1140   ? ? Subjective  Pt reports she is doing well, glad to not wear a mask in therapy now.   ? Pertinent History February 26, 2021, pt reports she had a CVA, came to [facility] to ER and then was transferred to [facility] in [place] where she was admitted and after acute care she went to inpatient rehab.  Following inpt rehab, pt went home and had home health.   ? Patient Stated Goals Pt reports her goal is to get back to normal, be as independent as possible.   ? Currently in Pain? Yes   ? Pain Score 2    ? Pain Location Hand   ? Pain Orientation Left   ? Pain Descriptors / Indicators Aching;Tightness   ? Pain Type Chronic pain   ?  Pain Onset More than a month ago   ? Pain Frequency Intermittent   ? ?  ?  ? ?  ? ? ?Manual Therapy for edema management: ?Pt seen for manual mobilization skills to left scapula in sitting for elevation, depression, facilitation of upwards rotation followed by AAROM for the same motions as well as combining motions for forwards and backwards shoulder roll.  Manual massage to left hand, wrist and forearm with carpals spreads to help facilitate edema drainage from hand and digits along with manual lymphatic drainage skills to address edema management.  ? ?Manual stretch by therapist for web space to improve motion of thumb and fingers for use in grasping and hand function activities.  ? ?Therapeutic Exercises:  ?Pt seen for therapeutic exercises in sitting, Shoulder A/AROM for shoulder flexion, ABD, diagonal patterns, elbow flexion/extension, supination/pronation, wrist flexion/extension, digit flexion and extension.  ?Gross grasp and release of variety of objects, cylindrical, spherical and block shapes.  Cues to lift fingers more when dropping items into a container to help clear the container sides.   ? ?Response to tx:   ?Pt has continued to demonstrate improvements in edema management of left hand and wrist.  She is  able to fold and don glove with modified independence now given increased time.  She is utilizing her left hand more into her daily tasks at home.  Pt starting to demonstrate initiation of isolated finger movements and improved grasping/finger flexion as edema decreases.  Continue to work towards goals in plan of care to improve left UE ROM, strength and function for use in ADL and IADL tasks.  ? ?  ? ? ? ? ? ? ? ? ? ? ? ? ? ? ? ? ? ? OT Education - 06/21/21 1142   ? ? Education Details self ROM of L wrist and hand, grasp/release exercises   ? Person(s) Educated Patient   ? Methods Explanation;Demonstration;Tactile cues;Verbal cues   ? Comprehension Verbalized understanding;Returned  demonstration;Verbal cues required;Need further instruction;Tactile cues required   ? ?  ?  ? ?  ? ? ? ? ? ? OT Long Term Goals - 05/26/21 1730   ? ?  ? OT LONG TERM GOAL #1  ? Title Pt will be independent with home exercise program.   ? Baseline no current program   ? Time 12   ? Period Weeks   ? Status New   ? Target Date 08/18/21   ?  ? OT LONG TERM GOAL #2  ? Title Pt will complete UB and LB dressing with modified independence including buttons, snaps and zippers.   ? Baseline requires min assist at eval   ? Time 12   ? Period Weeks   ? Status New   ? Target Date 08/18/21   ?  ? OT LONG TERM GOAL #3  ? Title Pt will perform shower transfer with modified independence.   ? Baseline Pt requires supervision to min assist for shower transfer at home.   ? Time 6   ? Period Weeks   ? Status New   ? Target Date 07/07/21   ?  ? OT LONG TERM GOAL #4  ? Title Pt will improve L hand grip by 10# to assist with holding items in left hand securely.   ? Baseline no grip in left hand at eval   ? Time 12   ? Period Weeks   ? Status New   ? Target Date 08/18/21   ?  ? OT LONG TERM GOAL #5  ? Title Pt will improve left shoulder flexion by 10 degrees to improve reaching to obtain self care items from shelf/shoulder height.   ? Baseline difficulty with reach, shoulder flexion to 47 degrees.   ? Time 12   ? Period Weeks   ? Status New   ? Target Date 07/07/21   ?  ? Long Term Additional Goals  ? Additional Long Term Goals Yes   ?  ? OT LONG TERM GOAL #6  ? Title Pt will improve FOTO score to 47 or above to demonstrate a clinically relevant change in function to impact ADL tasks.   ? Baseline score of 30 at eval   ? Time 12   ? Period Weeks   ? Status New   ? Target Date 08/18/21   ?  ? OT LONG TERM GOAL #7  ? Title Pt will demonstrate ability to pick up small objects and complete 9 hole peg test in less than 2 mins.   ? Baseline unable to perform at eval   ? Time 12   ? Period Weeks   ? Status New   ? Target Date 08/18/21   ? ?  ?   ? ?  ? ? ? ? ? ? ? ?  Plan - 06/20/21 1143   ? ? Clinical Impression Statement Pt has continued to demonstrate improvements in edema management of left hand and wrist.  She is able to fold and don glove with modified independence now given increased time.  She is utilizing her left hand more into her daily tasks at home.  Pt starting to demonstrate initiation of isolated finger movements and improved grasping/finger flexion as edema decreases.  Continue to work towards goals in plan of care to improve left UE ROM, strength and function for use in ADL and IADL tasks.   ? OT Occupational Profile and History Detailed Assessment- Review of Records and additional review of physical, cognitive, psychosocial history related to current functional performance   ? Occupational performance deficits (Please refer to evaluation for details): ADL's;IADL's;Leisure;Rest and Sleep   ? Body Structure / Function / Physical Skills ADL;Coordination;Endurance;GMC;UE functional use;Balance;IADL;Pain;Dexterity;FMC;Strength;Edema;Mobility;ROM   ? Psychosocial Skills Environmental  Adaptations;Habits;Routines and Behaviors   ? Rehab Potential Good   ? Clinical Decision Making Several treatment options, min-mod task modification necessary   ? Comorbidities Affecting Occupational Performance: May have comorbidities impacting occupational performance   ? Modification or Assistance to Complete Evaluation  Min-Moderate modification of tasks or assist with assess necessary to complete eval   ? OT Frequency 2x / week   ? OT Duration 12 weeks   ? OT Treatment/Interventions Self-care/ADL training;Cryotherapy;Paraffin;Therapeutic exercise;DME and/or AE instruction;Functional Mobility Training;Balance training;Electrical Stimulation;Ultrasound;Neuromuscular education;Manual Therapy;Splinting;Moist Heat;Contrast Bath;Passive range of motion;Therapeutic activities;Patient/family education;Coping strategies training   ? Consulted and Agree with Plan of  Care Patient   ? ?  ?  ? ?  ? ? ?Patient will benefit from skilled therapeutic intervention in order to improve the following deficits and impairments:   ?Body Structure / Function / Physical Skills: ADL, Coordination, [place]

## 2021-06-22 NOTE — Therapy (Signed)
[facility] ?[place], [place], [address] ?Phone: [phone]   Fax:  [phone] ? ?Occupational Therapy Treatment ? ?Patient Details  ?Name: Teresa [name] ?MRN: [id number] ?Date of Birth: Aug 09, 1940 ?No data recorded ? ?Encounter Date: 06/21/2021 ? ? OT End of Session - 06/22/21 1634   ? ? Visit Number 9   ? Number of Visits 24   ? Date for OT Re-Evaluation 08/18/21   ? OT Start Time 1300   ? OT Stop Time 1345   ? OT Time Calculation (min) 45 min   ? Activity Tolerance Patient tolerated treatment well   ? Behavior During Therapy [facility] for tasks assessed/performed   ? ?  ?  ? ?  ? ? ?Past Medical History:  ?Diagnosis Date  ? Cancer [name])   ? skin  ? Hypothyroidism   ? Raynaud's disease   ? ? ?Past Surgical History:  ?Procedure Laterality Date  ? ABDOMINAL HYSTERECTOMY  1991  ? APPENDECTOMY  1991  ? BACK SURGERY  2010  ? Cervical fusion C4-5-6-7  ? CATARACT EXTRACTION, BILATERAL Bilateral 10/2016  ? CHOLECYSTECTOMY  1995  ? COLONOSCOPY WITH PROPOFOL N/A 12/08/2018  ? Procedure: COLONOSCOPY WITH PROPOFOL;  Surgeon: Toledo, Benay Pike, MD;  Location: [facility] ENDOSCOPY;  Service: Gastroenterology;  Laterality: N/A;  ? EYE SURGERY    ? ? ?There were no vitals filed for this visit. ? ? Subjective Assessment - 06/21/21 1632   ? ? Subjective  Pt reports her L shoulder continues to bother her.   ? Pertinent History February 26, 2021, pt reports she had a CVA, came to [name] to ER and then was transferred to [facility] in [place] where she was admitted and after acute care she went to inpatient rehab.  Following inpt rehab, pt went home and had home health.   ? Patient Stated Goals Pt reports her goal is to get back to normal, be as independent as possible.   ? Currently in Pain? Yes   ? Pain Score 3    ? Pain Location Shoulder   ? Pain Orientation Left   ? Pain Descriptors / Indicators Aching;Tightness   ? Pain Type Chronic pain   ? Pain Radiating  Towards shoulder to hand   ? Pain Onset More than a month ago   ? Pain Frequency Intermittent   ? Aggravating Factors  activity, arm in a dependent position, raising LUE   ? Pain Relieving Factors rest, gentle ROM, heat   ? Effect of Pain on Daily Activities pt guards LUE during activity and transfers   ? Multiple Pain Sites No   ? ?  ?  ? ?  ? ?Occupational Therapy Treatment: ?Therapeutic Exercise: ?Performed passive stretching for L scapula for increasing elevation, depression, protraction, and retraction.  Performed PROM and AAROM for L shoulder flex/abd/ER/horiz abd/add, passive forearm pron/sup, wrist flex/ext, and digit flex/ext/abd/add.  Prolonged MCP and PIP flexion stretch of all digits, working towards fingertips to palm by end of session.   ? ?Neuro re-ed: ?Performed grasp/release of jumbo pegs and placed into pegboard.  Pt uses a loose quadrupod grasp to pick up pegs, rolling pegs to edge of table to rake into palm.  Vc for attempting tripod grasp; was able when peg was placed in hand by OT where pt could grasp the top, but unable to achieve this grip with peg horizontal on table top.  ? ?Response to Treatment: ?Pt reports her L  shoulder continues to bother her towards posterior capsule.  Pt thinks she may be lifting her arm too high overhead when she performs self ROM.  OT reinforced importance of avoiding overstretching, keeping all planes within a pain free range to provide a gentle stretch.  Reviewed active scapular ROM with good return demo, and encouraged pt complete daily for reducing shoulder stiffness and pain.  Pt reported improved pain in L shoulder by end of session, less tightness after gentle passive and AAROM to LUE.  Pt making steady gains with grasp/release of small objects in L hand.  Pt is ~50% accurate to place jumbo pegs into pegboard without dropping or resetting grip.  Pt will continue to benefit from skilled OT to increase LUE strength and coordination and manage edema and  stiffness in L hand, working to increase engagement of LUE into daily tasks.  ? ? OT Education - 06/21/21 1633   ? ? Education Details self ROM of L wrist and hand and shoulder   ? Person(s) Educated Patient   ? Methods Explanation;Demonstration;Tactile cues;Verbal cues   ? Comprehension Verbalized understanding;Returned demonstration;Verbal cues required;Need further instruction;Tactile cues required   ? ?  ?  ? ?  ? ? ? ? ? ? OT Long Term Goals - 05/26/21 1730   ? ?  ? OT LONG TERM GOAL #1  ? Title Pt will be independent with home exercise program.   ? Baseline no current program   ? Time 12   ? Period Weeks   ? Status New   ? Target Date 08/18/21   ?  ? OT LONG TERM GOAL #2  ? Title Pt will complete UB and LB dressing with modified independence including buttons, snaps and zippers.   ? Baseline requires min assist at eval   ? Time 12   ? Period Weeks   ? Status New   ? Target Date 08/18/21   ?  ? OT LONG TERM GOAL #3  ? Title Pt will perform shower transfer with modified independence.   ? Baseline Pt requires supervision to min assist for shower transfer at home.   ? Time 6   ? Period Weeks   ? Status New   ? Target Date 07/07/21   ?  ? OT LONG TERM GOAL #4  ? Title Pt will improve L hand grip by 10# to assist with holding items in left hand securely.   ? Baseline no grip in left hand at eval   ? Time 12   ? Period Weeks   ? Status New   ? Target Date 08/18/21   ?  ? OT LONG TERM GOAL #5  ? Title Pt will improve left shoulder flexion by 10 degrees to improve reaching to obtain self care items from shelf/shoulder height.   ? Baseline difficulty with reach, shoulder flexion to 47 degrees.   ? Time 12   ? Period Weeks   ? Status New   ? Target Date 07/07/21   ?  ? Long Term Additional Goals  ? Additional Long Term Goals Yes   ?  ? OT LONG TERM GOAL #6  ? Title Pt will improve FOTO score to 47 or above to demonstrate a clinically relevant change in function to impact ADL tasks.   ? Baseline score of 30 at eval   ?  Time 12   ? Period Weeks   ? Status New   ? Target Date 08/18/21   ?  ? OT  LONG TERM GOAL #7  ? Title Pt will demonstrate ability to pick up small objects and complete 9 hole peg test in less than 2 mins.   ? Baseline unable to perform at eval   ? Time 12   ? Period Weeks   ? Status New   ? Target Date 08/18/21   ? ?  ?  ? ?  ? ? ? ? Plan - 06/21/21 1652   ? ? Clinical Impression Statement Pt reports her L shoulder continues to bother her towards posterior capsule.  Pt thinks she may be lifting her arm too high overhead when she performs self ROM.  OT reinforced importance of avoiding overstretching, keeping all planes within a pain free range to provide a gentle stretch.  Reviewed active scapular ROM with good return demo, and encouraged pt complete daily for reducing shoulder stiffness and pain.  Pt reported improved pain in L shoulder by end of session, less tightness after gentle passive and AAROM to LUE.  Pt making steady gains with grasp/release of small objects in L hand.  Pt is ~50% accurate to place jumbo pegs into pegboard without dropping or resetting grip.  Pt will continue to benefit from skilled OT to increase LUE strength and coordination and manage edema and stiffness in L hand, working to increase engagement of LUE into daily tasks.   ? OT Occupational Profile and History Detailed Assessment- Review of Records and additional review of physical, cognitive, psychosocial history related to current functional performance   ? Occupational performance deficits (Please refer to evaluation for details): ADL's;IADL's;Leisure;Rest and Sleep   ? Body Structure / Function / Physical Skills ADL;Coordination;Endurance;GMC;UE functional use;Balance;IADL;Pain;Dexterity;FMC;Strength;Edema;Mobility;ROM   ? Psychosocial Skills Environmental  Adaptations;Habits;Routines and Behaviors   ? Rehab Potential Good   ? Clinical Decision Making Several treatment options, min-mod task modification necessary   ? Comorbidities  Affecting Occupational Performance: May have comorbidities impacting occupational performance   ? Modification or Assistance to Complete Evaluation  Min-Moderate modification of tasks or assist with assess necessa

## 2021-06-26 ENCOUNTER — Ambulatory Visit: Payer: Medicare PPO | Attending: Internal Medicine

## 2021-06-26 ENCOUNTER — Ambulatory Visit: Payer: Medicare PPO | Admitting: Occupational Therapy

## 2021-06-26 DIAGNOSIS — G8929 Other chronic pain: Secondary | ICD-10-CM | POA: Insufficient documentation

## 2021-06-26 DIAGNOSIS — R1312 Dysphagia, oropharyngeal phase: Secondary | ICD-10-CM | POA: Diagnosis present

## 2021-06-26 DIAGNOSIS — R482 Apraxia: Secondary | ICD-10-CM | POA: Diagnosis present

## 2021-06-26 DIAGNOSIS — R2689 Other abnormalities of gait and mobility: Secondary | ICD-10-CM | POA: Diagnosis present

## 2021-06-26 DIAGNOSIS — I63511 Cerebral infarction due to unspecified occlusion or stenosis of right middle cerebral artery: Secondary | ICD-10-CM | POA: Diagnosis present

## 2021-06-26 DIAGNOSIS — R471 Dysarthria and anarthria: Secondary | ICD-10-CM | POA: Insufficient documentation

## 2021-06-26 DIAGNOSIS — M25512 Pain in left shoulder: Secondary | ICD-10-CM | POA: Insufficient documentation

## 2021-06-26 DIAGNOSIS — M25551 Pain in right hip: Secondary | ICD-10-CM | POA: Insufficient documentation

## 2021-06-26 DIAGNOSIS — R262 Difficulty in walking, not elsewhere classified: Secondary | ICD-10-CM | POA: Diagnosis present

## 2021-06-26 DIAGNOSIS — R1311 Dysphagia, oral phase: Secondary | ICD-10-CM | POA: Insufficient documentation

## 2021-06-26 DIAGNOSIS — M6281 Muscle weakness (generalized): Secondary | ICD-10-CM

## 2021-06-26 DIAGNOSIS — R278 Other lack of coordination: Secondary | ICD-10-CM | POA: Insufficient documentation

## 2021-06-26 DIAGNOSIS — R2681 Unsteadiness on feet: Secondary | ICD-10-CM | POA: Insufficient documentation

## 2021-06-26 DIAGNOSIS — R269 Unspecified abnormalities of gait and mobility: Secondary | ICD-10-CM | POA: Diagnosis present

## 2021-06-26 NOTE — Therapy (Signed)
[place] [facility] ?[place], [place], [address] ?Phone: [phone]   Fax:  [phone] ? ?Physical Therapy Treatment ? ?Patient Details  ?Name: Teresa [name] [facility] ?MRN: [id number] ?Date of Birth: March 11, 1941 ?Referring Provider (PT): [name], MD ? ? ?Encounter Date: 06/26/2021 ? ? PT End of Session - 06/26/21 1544   ? ? Visit Number 9   ? Number of Visits 25   ? Date for PT Re-Evaluation 08/16/21   ? PT Start Time 1147   ? PT Stop Time 6962   ? PT Time Calculation (min) 42 min   ? Equipment Utilized During Treatment Gait belt   ? Activity Tolerance Patient tolerated treatment well   ? Behavior During Therapy [facility] for tasks assessed/performed   ? ?  ?  ? ?  ? ? ?Past Medical History:  ?Diagnosis Date  ? Cancer [facility])   ? skin  ? Hypothyroidism   ? Raynaud's disease   ? ? ?Past Surgical History:  ?Procedure Laterality Date  ? ABDOMINAL HYSTERECTOMY  1991  ? APPENDECTOMY  1991  ? BACK SURGERY  2010  ? Cervical fusion C4-5-6-7  ? CATARACT EXTRACTION, BILATERAL Bilateral 10/2016  ? CHOLECYSTECTOMY  1995  ? COLONOSCOPY WITH PROPOFOL N/A 12/08/2018  ? Procedure: COLONOSCOPY WITH PROPOFOL;  Surgeon: Toledo, Benay Pike, MD;  Location: [facility] ENDOSCOPY;  Service: Gastroenterology;  Laterality: N/A;  ? EYE SURGERY    ? ? ?There were no vitals filed for this visit. ? ? Subjective Assessment - 06/26/21 1144   ? ? Subjective Pt reports her arm feels 100% better after working with OT. Pt reports no aches/pains currently.   ? Pertinent History Pt is a 81 y.o. 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 Pt completed CIR from 03/02/21-03/22/21. PMH includes: skin cancer, hypothyroidism, and Raynaud's disease, history of cervical fusion in 2010 C4-C7.  Pt reports HH PT/OT/SLP was performed from January 2nd to February 28th with focus on ADL completion, ambulation. Pt's big goal tranisitioning to ambulating up to 3 miles a day which pt was doing prior to  stroke. Pt has been ambulating in [employer] with husband but last day or two pt has noticed increased L foot drag but states it is better today. Pt denies any falls at home or community. Pt reports balance deficits with turns primarily. States her balance is good with her quad cane. Wishing to return to full independence in ambulation with household and community ambulation. Wishing to be able to asc/desc full flight of stairs with ability to carry decorative items and other household items without need for handrail and/or AD.   ? Limitations Lifting;Standing;Walking;House hold activities   ? How long can you sit comfortably? unlimited   ? How long can you stand comfortably? unlimited   ? How long can you walk comfortably? ~ 1.5 hours   ? Patient Stated Goals Return to independent walking for 3 miles. Return to to crochet.   ? Currently in Pain? No/denies   ? Pain Onset More than a month ago   ? ?  ?  ? ?  ? ? ?INTERVENTIONS:  ? ?Neuromuscular re-ed:  gait belt donned and CGA provided throughout unless otherwise specified  ?  ?Dynamic lateral step up (left and right) onto airex pad- x 20 each direction. Pt with min cuing for foot clearance.  ? ?Rocker board - Lateral weight shift and forward/backward weight shift x several reps of each -  pt rates as easy ? ?Steps- Exercise: ? ?PT provided further instruction in the following- ?Pt ascends stairs using left rail, feet positioned at 45 deg - Pt descends stairs similarly holding rail with RUE step-to pattern 2x through. Only one instance with first set of slight foot drag. Close CGA was provided throughout. ?  ?Sit to stand without UE Support and airex pad under 1 foot x 10 reps performed on each LE. Cuing to not brace off of Les. Pt rates easy, exhibits no loss of stability/balance ? ?Ambulating with SPC, while reading sticky notes for dual cog task and to utilize horizontal and vertical head turns -  3x length of hall. Pt reports she feels improved foot clearance, no foot  drag noted. Pt rates easy  ? ?Cone taps with each LE, no UE support x multiple reps - knocks one cone over. Pt rates easy.  ? ?On airex at support surface: ?WBOS with vertical, horizontal head turns 10x for each, minimal increase in sway. ? ?Alternating toe taps on step no UE support 20x alternating.  ? ?Forward/backward and lateral stepping over hurdle to promote foot clearance. Pt rates challenging going forward/backward but easy with lateral stepping. Exhibits circumduction compensation with forward/backward stepping but improves with cuing. ? ?standing on airex alternating cone taps x multiple reps each LE. No UE support. PT completes without knocking over cones. ?  ?Education provided throughout session via VC/TC and demonstration to facilitate movement at target joints and correct muscle activation for all testing and exercises performed.  ?  ? ? ? ? ? PT Education - 06/26/21 1544   ? ? Education Details exercise technique, body mechanics   ? Person(s) Educated Patient   ? Methods Explanation;Demonstration;Verbal cues   ? Comprehension Verbalized understanding;Returned demonstration   ? ?  ?  ? ?  ? ? ? PT Short Term Goals - 05/24/21 0932   ? ?  ? PT SHORT TERM GOAL #1  ? Title Pt will be indep with HEP to improve balance, strength, and gait to optimize independence with ADL completion   ? Baseline 05/24/21: Initiated   ? Time 6   ? Period Weeks   ? Status New   ? Target Date 07/05/21   ? ?  ?  ? ?  ? ? ? ? PT Long Term Goals - 05/24/21 0933   ? ?  ? PT LONG TERM GOAL #1  ? Title Pt will improve FOTO to target score to demonstrate clinically significant improvement in functional mobility   ? Baseline 05/24/21: next session   ? Time 12   ? Period Weeks   ? Status New   ? Target Date 08/02/21   ?  ? PT LONG TERM GOAL #2  ? Title Pt will improve 5xSTS to 12 sec or less to indicate clinically significant improvement in LE strength   ? Baseline 05/24/21: 17.51 sec   ? Time 12   ? Period Weeks   ? Status New   ? Target  Date 08/16/21   ?  ? PT LONG TERM GOAL #3  ? Title Pt will improve 6 MWT by 165' to indicate clinically significant improvement in community ambulation distance for geriatric stroke norms.   ? Baseline 05/24/21: 765'   ? Time 12   ? Period Weeks   ? Status New   ? Target Date 08/16/21   ?  ? PT LONG TERM GOAL #4  ? Title Pt will improve 10 m gait speed  to at least 1.0 m/s to demonstrate reduced risk of falls with community ambulation tasks.   ? Baseline 05/24/21: .63 m/s without AD   ? Time 12   ? Period Weeks   ? Status New   ? Target Date 08/16/21   ?  ? PT LONG TERM GOAL #5  ? Title Pt will improve FGA  by at least 5 point to demonstrate clinically signifcant reduced risk of falls with dynamic balance/ambulation tasks.   ? Baseline 05/24/21: 17   ? Time 12   ? Period Weeks   ? Status New   ? Target Date 08/16/21   ? ?  ?  ? ?  ? ? ? ? ? ? ? ? Plan - 06/26/21 1553   ? ? Clinical Impression Statement Pt with excellent motivation to participate in session. Pt able to perform more challenging compliant surface and SLB interventions, requiring only CGA. Pt with most difficuly with foot clearance intervention over hurdle when stepping forward/backward. The pt will benefit from further skilled PT to improve strength, balance and mobility to decrease fall risk.   ? Personal Factors and Comorbidities Age;Time since onset of injury/illness/exacerbation;Comorbidity 3+;Past/Current Experience   ? Examination-Activity Limitations Squat;Stairs;Locomotion Level;Stand   ? Examination-Participation Restrictions Shop;Community Activity;[employer] Work   ? Stability/Clinical Decision Making Evolving/Moderate complexity   ? Rehab Potential Good   ? PT Frequency 2x / week   ? PT Duration 12 weeks   ? PT Treatment/Interventions ADLs/Self Care Home Management;Canalith Repostioning;Electrical Stimulation;DME Instruction;Gait training;Stair training;Functional mobility training;Therapeutic activities;Patient/family education;Therapeutic  exercise;Balance training;Neuromuscular re-education;Energy conservation;Vestibular;Passive range of motion   ? PT Next Visit Plan Progress LE strength, Gait training - heel to toe sequencing, progress balance as app

## 2021-06-28 ENCOUNTER — Institutional Professional Consult (permissible substitution): Payer: Medicare PPO | Admitting: Internal Medicine

## 2021-06-28 ENCOUNTER — Encounter: Payer: Self-pay | Admitting: Occupational Therapy

## 2021-06-28 NOTE — Therapy (Signed)
[place] [facility] ?[place], [place], [address] ?Phone: [phone]   Fax:  [phone] ? ?Occupational Therapy Treatment/Progress Update ?Reporting period from 05/24/2021 ?to 06/26/2021 ? ?Patient Details  ?Name: Teresa [name] ?MRN: [id number] ?Date of Birth: June 26, 1940 ?No data recorded ? ?Encounter Date: 06/26/2021 ? ? OT End of Session - 06/28/21 0902   ? ? Visit Number 10   ? Number of Visits 24   ? Date for OT Re-Evaluation 08/18/21   ? OT Start Time 1100   ? OT Stop Time 1145   ? OT Time Calculation (min) 45 min   ? Activity Tolerance Patient tolerated treatment well   ? Behavior During Therapy [facility] for tasks assessed/performed   ? ?  ?  ? ?  ? ? ?Past Medical History:  ?Diagnosis Date  ? Cancer [facility])   ? skin  ? Hypothyroidism   ? Raynaud's disease   ? ? ?Past Surgical History:  ?Procedure Laterality Date  ? ABDOMINAL HYSTERECTOMY  1991  ? APPENDECTOMY  1991  ? BACK SURGERY  2010  ? Cervical fusion C4-5-6-7  ? CATARACT EXTRACTION, BILATERAL Bilateral 10/2016  ? CHOLECYSTECTOMY  1995  ? COLONOSCOPY WITH PROPOFOL N/A 12/08/2018  ? Procedure: COLONOSCOPY WITH PROPOFOL;  Surgeon: Toledo, Benay Pike, MD;  Location: [facility] ENDOSCOPY;  Service: Gastroenterology;  Laterality: N/A;  ? EYE SURGERY    ? ? ?There were no vitals filed for this visit. ? ? Subjective Assessment - 06/28/21 0900   ? ? Subjective  Pt reports she feels she is making progress but would like to progress much faster with her arm.  Edema is improving overall and reports "today is a good day"   ? Pertinent History February 26, 2021, pt reports she had a CVA, came to [facility] to ER and then was transferred to [facility] in [place] where she was admitted and after acute care she went to inpatient rehab.  Following inpt rehab, pt went home and had home health.   ? Patient Stated Goals Pt reports her goal is to get back to normal, be as independent as possible.   ? Currently in Pain? Yes    ? Pain Score 3    ? Pain Location Shoulder   ? Pain Orientation Left   ? Pain Descriptors / Indicators Aching;Tightness   ? Pain Type Chronic pain   ? Pain Onset More than a month ago   ? Pain Frequency Intermittent   ? ?  ?  ? ?  ? ? ?Manual Therapy: ?Mobilization to left scapula for elevation, retraction and upwards rotation followed by AAROM with facilitation for same movements.  Soft tissue mobs to left hand and wrist, carpal spreads, radius on ulna mobs to impact edema in left hand and wrist as well as improve movement towards supination of the forearm.   ? ?Neuromuscular Reeducation: ?Pt seen for facilitation of active movement in left UE for shoulder flexion to 90 degrees in supine, place and hold for 3 secs, worked towards control of left arm with decent from flexion.  Shoulder protraction for 2 sets of 5 reps each with assist to keep arm in 90 degrees of flexion.  Elbow extension with tapping to triceps for 2 sets of 5 reps with shoulder placed in 90 degrees of flexion.  ADD of arm reaching towards opposite shoulder with guiding from therapist.  Elbow extension with arm at her side with mild resistance from therapist. Supination/pronation followed  by gross grasp and release of med sized objects. ? ?Reassessment of goals ? ?Response to tx: ?Pt has continued to make excellent progress, edema in left hand has decreased with use of contrast, positioning, edema compression glove and increased use.  Pt responding well to facilitation techniques for movement in left UE with improved control of LUE, improved place and hold and improvements noted towards gross grasping. She still is unable to produce a composite fist but with decreased edema in hand she is able to move towards improved finger flexion towards fisting.  Pain present at times but overall decreased.  Pt continues to benefit from skilled OT to maximize safety and independence in necessary daily tasks.  ? ? ? ? ? ? ? ? ? ? ? ? ? ? ? ? ? ? ? ? OT  Education - 06/28/21 0901   ? ? Education Details shoulder stabilization exercises in supine, grasp and release   ? Person(s) Educated Patient   ? Methods Explanation;Demonstration;Tactile cues;Verbal cues   ? Comprehension Verbalized understanding;Returned demonstration;Verbal cues required;Need further instruction;Tactile cues required   ? ?  ?  ? ?  ? ? ? ? ? ? OT Long Term Goals - 06/28/21 0904   ? ?  ? OT LONG TERM GOAL #1  ? Title Pt will be independent with home exercise program.   ? Baseline Eval: no current program, 10th visit:  continue to add new exercises as pt progresses   ? Time 12   ? Period Weeks   ? Status On-going   ? Target Date 08/18/21   ?  ? OT LONG TERM GOAL #2  ? Title Pt will complete UB and LB dressing with modified independence including buttons, snaps and zippers.   ? Baseline requires min assist at eval, 10th visit: occasional assist with buttons   ? Time 12   ? Period Weeks   ? Status On-going   ? Target Date 08/18/21   ?  ? OT LONG TERM GOAL #3  ? Title Pt will perform shower transfer with modified independence.   ? Baseline Pt requires supervision to min assist for shower transfer at home. 10th visit: supervision   ? Time 6   ? Period Weeks   ? Status On-going   ? Target Date 07/07/21   ?  ? OT LONG TERM GOAL #4  ? Title Pt will improve L hand grip by 10# to assist with holding items in left hand securely.   ? Baseline no grip in left hand at eval, 10th visit:  improved flexion but still working towards composite fisting and grip   ? Time 12   ? Period Weeks   ? Status On-going   ? Target Date 08/18/21   ?  ? OT LONG TERM GOAL #5  ? Title Pt will improve left shoulder flexion by 10 degrees to improve reaching to obtain self care items from shelf/shoulder height.   ? Baseline difficulty with reach, shoulder flexion to 47 degrees.   ? Time 12   ? Period Weeks   ? Status On-going   ? Target Date 07/07/21   ?  ? OT LONG TERM GOAL #6  ? Title Pt will improve FOTO score to 47 or above to  demonstrate a clinically relevant change in function to impact ADL tasks.   ? Baseline score of 30 at eval   ? Time 12   ? Period Weeks   ? Status On-going   ? Target Date  08/18/21   ?  ? OT LONG TERM GOAL #7  ? Title Pt will demonstrate ability to pick up small objects and complete 9 hole peg test in less than 2 mins.   ? Baseline unable to perform at eval, 10th visit: still unable to pick up small pegs   ? Time 12   ? Period Weeks   ? Status On-going   ? Target Date 08/18/21   ? ?  ?  ? ?  ? ? ? ? ? ? ? ? Plan - 06/28/21 0902   ? ? Clinical Impression Statement Pt has continued to make excellent progress, edema in left hand has decreased with use of contrast, positioning, edema compression glove and increased use.  Pt responding well to facilitation techniques for movement in left UE with improved control of LUE, improved place and hold and improvements noted towards gross grasping. She still is unable to produce a composite fist but with decreased edema in hand she is able to move towards improved finger flexion towards fisting.  Pain present at times but overall decreased.  Pt continues to benefit from skilled OT to maximize safety and independence in necessary daily tasks.   ? OT Occupational Profile and History Detailed Assessment- Review of Records and additional review of physical, cognitive, psychosocial history related to current functional performance   ? Occupational performance deficits (Please refer to evaluation for details): ADL's;IADL's;Leisure;Rest and Sleep   ? Body Structure / Function / Physical Skills ADL;Coordination;Endurance;GMC;UE functional use;Balance;IADL;Pain;Dexterity;FMC;Strength;Edema;Mobility;ROM   ? Psychosocial Skills Environmental  Adaptations;Habits;Routines and Behaviors   ? Rehab Potential Good   ? Clinical Decision Making Several treatment options, min-mod task modification necessary   ? Comorbidities Affecting Occupational Performance: May have comorbidities impacting  occupational performance   ? Modification or Assistance to Complete Evaluation  Min-Moderate modification of tasks or assist with assess necessary to complete eval   ? OT Frequency 2x / week   ? OT Duration 12 weeks

## 2021-06-29 ENCOUNTER — Ambulatory Visit: Payer: Medicare PPO

## 2021-06-29 ENCOUNTER — Ambulatory Visit: Payer: Medicare PPO | Admitting: Occupational Therapy

## 2021-06-29 DIAGNOSIS — R278 Other lack of coordination: Secondary | ICD-10-CM

## 2021-06-29 DIAGNOSIS — M6281 Muscle weakness (generalized): Secondary | ICD-10-CM

## 2021-06-29 DIAGNOSIS — R2681 Unsteadiness on feet: Secondary | ICD-10-CM | POA: Diagnosis not present

## 2021-06-29 DIAGNOSIS — G8929 Other chronic pain: Secondary | ICD-10-CM

## 2021-06-29 DIAGNOSIS — I63511 Cerebral infarction due to unspecified occlusion or stenosis of right middle cerebral artery: Secondary | ICD-10-CM

## 2021-06-29 NOTE — Therapy (Signed)
[place] [facility] ?[place], [place], [address] ?Phone: [phone]   Fax:  [phone] ? ?Physical Therapy Treatment/Physical Therapy Progress Note ? ? ?Dates of reporting period  05/24/2021   to   06/29/2021 ? ? ?Patient Details  ?Name: Teresa [name] [facility] ?MRN: [id number] ?Date of Birth: 17-Oct-1940 ?Referring Provider (PT): [name], MD ? ? ?Encounter Date: 06/29/2021 ? ? PT End of Session - 06/29/21 1112   ? ? Visit Number 10   ? Number of Visits 25   ? Date for PT Re-Evaluation 08/16/21   ? PT Start Time 5170   ? PT Stop Time 1013   ? PT Time Calculation (min) 41 min   ? Equipment Utilized During Treatment Gait belt   ? Activity Tolerance Patient tolerated treatment well   ? Behavior During Therapy [facility] for tasks assessed/performed   ? ?  ?  ? ?  ? ? ?Past Medical History:  ?Diagnosis Date  ? Cancer [facility])   ? skin  ? Hypothyroidism   ? Raynaud's disease   ? ? ?Past Surgical History:  ?Procedure Laterality Date  ? ABDOMINAL HYSTERECTOMY  1991  ? APPENDECTOMY  1991  ? BACK SURGERY  2010  ? Cervical fusion C4-5-6-7  ? CATARACT EXTRACTION, BILATERAL Bilateral 10/2016  ? CHOLECYSTECTOMY  1995  ? COLONOSCOPY WITH PROPOFOL N/A 12/08/2018  ? Procedure: COLONOSCOPY WITH PROPOFOL;  Surgeon: Toledo, Benay Pike, MD;  Location: [facility] ENDOSCOPY;  Service: Gastroenterology;  Laterality: N/A;  ? EYE SURGERY    ? ? ?There were no vitals filed for this visit. ? ? Subjective Assessment - 06/29/21 0928   ? ? Subjective Pt reports arm feels tender at the moment. Pt reports no stumbles/falls. Pt reports no pain.   ? Pertinent History Pt is a 81 y.o. 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 Pt completed CIR from 03/02/21-03/22/21. PMH includes: skin cancer, hypothyroidism, and Raynaud's disease, history of cervical fusion in 2010 C4-C7.  Pt reports HH PT/OT/SLP was performed from January 2nd to February 28th with focus on ADL completion, ambulation. Pt's  big goal tranisitioning to ambulating up to 3 miles a day which pt was doing prior to stroke. Pt has been ambulating in [employer] with husband but last day or two pt has noticed increased L foot drag but states it is better today. Pt denies any falls at home or community. Pt reports balance deficits with turns primarily. States her balance is good with her quad cane. Wishing to return to full independence in ambulation with household and community ambulation. Wishing to be able to asc/desc full flight of stairs with ability to carry decorative items and other household items without need for handrail and/or AD.   ? Limitations Lifting;Standing;Walking;House hold activities   ? How long can you sit comfortably? unlimited   ? How long can you stand comfortably? unlimited   ? How long can you walk comfortably? ~ 1.5 hours   ? Patient Stated Goals Return to independent walking for 3 miles. Return to to crochet.   ? Currently in Pain? No/denies   ? Pain Onset More than a month ago   ? ?  ?  ? ?  ? ?  ?INTERVENTIONS:  ? ?Goals reassessed for progress note on this date. Refer to goal section for details.  ? ?PT instructs pt through technique with the following tests, pt then completes each test- ?FOTO: 64  ?5xSTS: 14.2  sec (improved) ?6MWT: 842 ft with SPC (improved) ?10MWT: 0.83 m/s without AD (improved) ?FGA: 22 (improved) ? ? ? ?  ?Education provided throughout session via VC/TC and demonstration to facilitate movement at target joints and correct muscle activation for all testing and exercises performed.  ? ? ? ? ? PT Education - 06/29/21 1114   ? ? Education Details goals   ? Person(s) Educated Patient   ? Methods Explanation;Demonstration;Tactile cues;Verbal cues   ? Comprehension Verbalized understanding;Returned demonstration;Verbal cues required;Need further instruction   ? ?  ?  ? ?  ? ? ? PT Short Term Goals - 06/29/21 1115   ? ?  ? PT SHORT TERM GOAL #1  ? Title Pt will be indep with HEP to improve balance,  strength, and gait to optimize independence with ADL completion   ? Baseline 05/24/21: Initiated 4/7: to be advanced   ? Time 6   ? Period Weeks   ? Status On-going   ? Target Date 07/05/21   ? ?  ?  ? ?  ? ? ? ? PT Long Term Goals - 06/29/21 0932   ? ?  ? PT LONG TERM GOAL #1  ? Title Pt will improve FOTO to target score to 67 demonstrate clinically significant improvement in functional mobility   ? Baseline 05/24/21: next session 4/7: 64   ? Time 12   ? Period Weeks   ? Status On-going   ? Target Date 08/02/21   ?  ? PT LONG TERM GOAL #2  ? Title Pt will improve 5xSTS to 12 sec or less to indicate clinically significant improvement in LE strength   ? Baseline 05/24/21: 17.51 sec 4/7: 14.2 sec hands-free   ? Time 12   ? Period Weeks   ? Status Partially Met   ? Target Date 08/16/21   ?  ? PT LONG TERM GOAL #3  ? Title Pt will improve 6 MWT by 165' to indicate clinically significant improvement in community ambulation distance for geriatric stroke norms.   ? Baseline 05/24/21: 765' 4/6: 842 ft with SPC   ? Time 12   ? Period Weeks   ? Status On-going   ? Target Date 08/16/21   ?  ? PT LONG TERM GOAL #4  ? Title Pt will improve 10 m gait speed to at least 1.0 m/s to demonstrate reduced risk of falls with community ambulation tasks.   ? Baseline 05/24/21: .63 m/s without AD 4/6: 0.83 without an AD   ? Time 12   ? Period Weeks   ? Status On-going   ? Target Date 08/16/21   ?  ? PT LONG TERM GOAL #5  ? Title Pt will improve FGA  by at least 5 point to demonstrate clinically signifcant reduced risk of falls with dynamic balance/ambulation tasks.   ? Baseline 05/24/21: 17; 4/7: 22   ? Time 12   ? Period Weeks   ? Status Achieved   ? Target Date 08/16/21   ? ?  ?  ? ?  ? ? ? ? ? ? ? ? Plan - 06/29/21 1116   ? ? Clinical Impression Statement Goals retested for progress note. Pt making gains AEB improvements on 5xSTS, 6MWT, 10MWT and FGA. This indicates decreased fall risk and increased BLE power, improved gait speed and  ability/functional capacity, and improved balance. While pt making gains, she has not yet met these goals. The patient's condition has the potential to improve in response  to therapy. Maximum improvement is yet to be obtained. The anticipated improvement is attainable and reasonable in a generally predictable time.  The pt will benefit from further skilled PT to continue to improve gait, strength, balance and mobility to increase ease and safety with ADLs and decrease fall risk.   ? Personal Factors and Comorbidities Age;Time since onset of injury/illness/exacerbation;Comorbidity 3+;Past/Current Experience   ? Examination-Activity Limitations Squat;Stairs;Locomotion Level;Stand   ? Examination-Participation Restrictions Shop;Community Activity;[employer] Work   ? Stability/Clinical Decision Making Evolving/Moderate complexity   ? Rehab Potential Good   ? PT Frequency 2x / week   ? PT Duration 12 weeks   ? PT Treatment/Interventions ADLs/Self Care Home Management;Canalith Repostioning;Electrical Stimulation;DME Instruction;Gait training;Stair training;Functional mobility training;Therapeutic activities;Patient/family education;Therapeutic exercise;Balance training;Neuromuscular re-education;Energy conservation;Vestibular;Passive range of motion   ? PT Next Visit Plan Progress LE strength, Gait training - heel to toe sequencing, progress balance as appropriate. Continue plan   ? PT Home Exercise Plan Seated L ankle DF; 3/10 Access Code: [email or web address]; no updates   ? Consulted and Agree with Plan of Care Patient   ? ?  ?  ? ?  ? ? ?Patient will benefit from skilled therapeutic intervention in order to improve the following deficits and impairments:  Abnormal gait, Pain, Decreased mobility, Decreased activity tolerance, Decreased endurance, Decreased strength, Decreased balance, Difficulty walking ? ?Visit Diagnosis: ?Right middle cerebral artery stroke (HCC) ? ?Unsteadiness on feet ? ?Other lack of coordination ? ?Muscle weakness  (generalized) ? ? ? ? ?Problem List ?Patient Active Problem List  ? Diagnosis Date Noted  ? Right middle cerebral artery stroke ([place]) 03/01/2021  ? Stroke [facility]) 02/27/2021  ? History of nonmelanoma skin cancer 05/23/2014

## 2021-06-30 ENCOUNTER — Encounter: Payer: Self-pay | Admitting: Occupational Therapy

## 2021-06-30 NOTE — Therapy (Addendum)
?[facility] ?[place], [place], [address] ?Phone: [phone]   Fax:  [phone] ? ?Occupational Therapy Treatment ? ?Patient Details  ?Name: Teresa [name] ?MRN: [id number] ?Date of Birth: [birth year] ?No data recorded ? ?Encounter Date: 06/29/2021 ? ? OT End of Session - 06/30/21 2156   ? ? Visit Number 11   ? Number of Visits 24   ? Date for OT Re-Evaluation 08/18/21   ? OT Start Time 1015   ? OT Stop Time 1100   ? OT Time Calculation (min) 45 min   ? Activity Tolerance Patient tolerated treatment well   ? Behavior During Therapy [facility] for tasks assessed/performed   ? ?  ?  ? ?  ? ? ?Past Medical History:  ?Diagnosis Date  ? Cancer [facility])   ? skin  ? Hypothyroidism   ? Raynaud's disease   ? ? ?Past Surgical History:  ?Procedure Laterality Date  ? ABDOMINAL HYSTERECTOMY  1991  ? APPENDECTOMY  1991  ? BACK SURGERY  2010  ? Cervical fusion C4-5-6-7  ? CATARACT EXTRACTION, BILATERAL Bilateral 10/2016  ? CHOLECYSTECTOMY  1995  ? COLONOSCOPY WITH PROPOFOL N/A 12/08/2018  ? Procedure: COLONOSCOPY WITH PROPOFOL;  Surgeon: Toledo, Benay Pike, MD;  Location: [facility] ENDOSCOPY;  Service: Gastroenterology;  Laterality: N/A;  ? EYE SURGERY    ? ? ?There were no vitals filed for this visit. ? ? Subjective Assessment - 06/30/21 2155   ? ? Subjective  Pt reports her family is coming over for Easter dinner this weekend, husband is helping to prepare food items along with her daughter   ? Pertinent History February 26, 2021, pt reports she had a CVA, came to [facility] to ER and then was transferred to [facility] in [place] where she was admitted and after acute care she went to inpatient rehab.  Following inpt rehab, pt went home and had home health.   ? Patient Stated Goals Pt reports her goal is to get back to normal, be as independent as possible.   ? Currently in Pain? Yes   ? Pain Score 3    ? Pain Location Shoulder   ? Pain Orientation Left   ? Pain  Descriptors / Indicators Aching;Tightness   ? Pain Type Chronic pain   ? Pain Onset More than a month ago   ? Pain Frequency Intermittent   ? ?  ?  ? ?  ? ?Manual Therapy: ?In supine, Mobilization to left scapula for elevation, retraction and upwards rotation followed by AAROM with facilitation for same movements.  Soft tissue mobs to left hand and wrist, carpal spreads, radius on ulna mobs to impact edema in left hand and wrist as well as improve movement towards supination of the forearm.  Pt with compression glove to left hand on arrival, reports edema has been down more unless she has her arm hanging in a dependent position for a longer period of time.   ?  ?Neuromuscular Reeducation: ?Pt seen for facilitation of active movement in left UE for shoulder flexion to 90 degrees in supine, place and hold for 5 secs, worked towards control of left arm with decent from flexion.  Shoulder protraction for 2 sets of 5 reps each with assist to keep arm in 90 degrees of flexion.  Elbow extension with tapping to triceps for 2 sets of 5 reps with shoulder placed in 90 degrees of flexion.  ADD of arm reaching towards opposite  shoulder with guiding from therapist.  Elbow extension with arm at her side with mild resistance from therapist. Supination/pronation followed by gross grasp and release of med sized objects. PROM to left hand with focus on PIP and DIP flexion within pain tolerance.   ? ? ?Response to tx: ?Pt reported some relief from pain after intervention last session and wanted to focus on exercises again in supine this date.  Pt's edema has been more consistently decreased with use of compression glove, massage, manual skills and positioning.  She does see an increase in edema if her arm is in a dependent position for any length of time.  Pt excited for the weekend since her family is coming to visit for the Easter holiday, she has been directing her husband on some of the cooking tasks and she is hoping to be able  to help make a pie for the gathering.  She has been consistently engaging in daily ROM and exercises at home.   ? ? ? ? ? ? ? ? ? ? ? ? ? ? ? ? ? ? ? ? ? OT Education - 06/30/21 2156   ? ? Education Details shoulder stabilization exercises in supine, grasp and release   ? Person(s) Educated Patient   ? Methods Explanation;Demonstration;Tactile cues;Verbal cues   ? Comprehension Verbalized understanding;Returned demonstration;Verbal cues required;Need further instruction;Tactile cues required   ? ?  ?  ? ?  ? ? ? ? ? ? OT Long Term Goals - 06/28/21 0904   ? ?  ? OT LONG TERM GOAL #1  ? Title Pt will be independent with home exercise program.   ? Baseline Eval: no current program, 10th visit:  continue to add new exercises as pt progresses   ? Time 12   ? Period Weeks   ? Status On-going   ? Target Date 08/18/21   ?  ? OT LONG TERM GOAL #2  ? Title Pt will complete UB and LB dressing with modified independence including buttons, snaps and zippers.   ? Baseline requires min assist at eval, 10th visit: occasional assist with buttons   ? Time 12   ? Period Weeks   ? Status On-going   ? Target Date 08/18/21   ?  ? OT LONG TERM GOAL #3  ? Title Pt will perform shower transfer with modified independence.   ? Baseline Pt requires supervision to min assist for shower transfer at home. 10th visit: supervision   ? Time 6   ? Period Weeks   ? Status On-going   ? Target Date 07/07/21   ?  ? OT LONG TERM GOAL #4  ? Title Pt will improve L hand grip by 10# to assist with holding items in left hand securely.   ? Baseline no grip in left hand at eval, 10th visit:  improved flexion but still working towards composite fisting and grip   ? Time 12   ? Period Weeks   ? Status On-going   ? Target Date 08/18/21   ?  ? OT LONG TERM GOAL #5  ? Title Pt will improve left shoulder flexion by 10 degrees to improve reaching to obtain self care items from shelf/shoulder height.   ? Baseline difficulty with reach, shoulder flexion to 47 degrees.    ? Time 12   ? Period Weeks   ? Status On-going   ? Target Date 07/07/21   ?  ? OT LONG TERM GOAL #6  ? Title  Pt will improve FOTO score to 47 or above to demonstrate a clinically relevant change in function to impact ADL tasks.   ? Baseline score of 30 at eval   ? Time 12   ? Period Weeks   ? Status On-going   ? Target Date 08/18/21   ?  ? OT LONG TERM GOAL #7  ? Title Pt will demonstrate ability to pick up small objects and complete 9 hole peg test in less than 2 mins.   ? Baseline unable to perform at eval, 10th visit: still unable to pick up small pegs   ? Time 12   ? Period Weeks   ? Status On-going   ? Target Date 08/18/21   ? ?  ?  ? ?  ? ? ? ? ? ? ? ? Plan - 06/30/21 2156   ? ? Clinical Impression Statement Pt reported some relief from pain after intervention last session and wanted to focus on exercises again in supine this date.  Pt's edema has been more consistently decreased with use of compression glove, massage, manual skills and positioning.  She does see an increase in edema if her arm is in a dependent position for any length of time.  Pt excited for the weekend since her family is coming to visit for the Easter holiday, she has been directing her husband on some of the cooking tasks and she is hoping to be able to help make a pie for the gathering.  She has been consistently engaging in daily ROM and exercises at home.    ? OT Occupational Profile and History Detailed Assessment- Review of Records and additional review of physical, cognitive, psychosocial history related to current functional performance   ? Occupational performance deficits (Please refer to evaluation for details): ADL's;IADL's;Leisure;Rest and Sleep   ? Body Structure / Function / Physical Skills ADL;Coordination;Endurance;GMC;UE functional use;Balance;IADL;Pain;Dexterity;FMC;Strength;Edema;Mobility;ROM   ? Psychosocial Skills Environmental  Adaptations;Habits;Routines and Behaviors   ? Rehab Potential Good   ? Clinical Decision  Making Several treatment options, min-mod task modification necessary   ? Comorbidities Affecting Occupational Performance: May have comorbidities impacting occupational performance   ? Modification or Assist

## 2021-07-03 NOTE — Progress Notes (Signed)
? ?ELECTROPHYSIOLOGY CONSULT NOTE  ?Patient ID: Teresa [name] ?MRN: [id number], DOB/AGE: May 05, 1940  ? ?Admit date: (Not on file) ?Date of Consult: 07/05/2021 ? ?Primary Physician: Idelle Crouch, MD ?Primary Cardiologist: new ?Reason for Consultation: Cryptogenic stroke; recommendations regarding Implantable Loop Recorder ? ?History of Present Illness  ?Teresa [name] was admitted on 12/22 with acute CVA. CT angio showed right M2 branch occlusion.  She is now referred by neurology and Dr. Doy Hutching for consideration of a loop recorder in the context of her embolic stroke of unknown source (ESUS) ? ?Residual left hand issues.  Balance issues.  Left leg is better.  Speech is also better she continues to struggle with some drooling and tingling around her mouth. ? ?she has been monitored on telemetry which has demonstrated no arrhythmias. No cause has been identified.   ? ?Echo >> EF 65-70% ?EP has been asked to evaluate for placement of an implantable loop recorder to monitor for atrial fibrillation. ? ? ?Date Cr K Hgb  ?12/22 1.05 4.1 11.5<<13.3  ?      ? ? ? ?Past Medical History ?Past Medical History:  ?Diagnosis Date  ? 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 Hypothyroidism   ? Raynaud's disease   ?  ?Past Surgical History ?Past Surgical History:  ?Procedure Laterality Date  ? ABDOMINAL HYSTERECTOMY  1991  ? APPENDECTOMY  1991  ? BACK SURGERY  2010  ? Cervical fusion C4-5-6-7  ? CATARACT EXTRACTION, BILATERAL Bilateral 10/2016  ? CHOLECYSTECTOMY  1995  ? COLONOSCOPY WITH PROPOFOL N/A 12/08/2018  ? Procedure: COLONOSCOPY WITH PROPOFOL;  Surgeon: Toledo, Benay Pike, MD;  Location: [facility] ENDOSCOPY;  Service: Gastroenterology;  Laterality: N/A;  ? EYE SURGERY    ?  ?Allergies/Intolerances ?Allergies  ?Allergen Reactions  ? Codeine Itching and Rash  ?  Other reaction(s): Vomiting  ? Misc. Sulfonamide Containing Compounds Rash  ? Penicillins Itching, Rash and Swelling  ? Sulfa Antibiotics Hives, Itching and Rash  ? ?Inpatient  Medications ? ? ?Social History ?Social History  ? ?Socioeconomic History  ? Marital status: Married  ?  Spouse name: Jazarah Capili  ? Number of children: Not on file  ? Years of education: Not on file  ? Highest education level: Not on file  ?Occupational History  ? Not on file  ?Tobacco Use  ? Smoking status: Never  ? Smokeless tobacco: Never  ?Vaping Use  ? Vaping Use: Never used  ?Substance and Sexual Activity  ? Alcohol use: Never  ? Drug use: Never  ? Sexual activity: Not on file  ?Other Topics Concern  ? Not on file  ?Social History Narrative  ? Not on file  ? ?Social Determinants of Health  ? ?Financial Resource Strain: Not on file  ?Food Insecurity: Not on file  ?Transportation Needs: Not on file  ?Physical Activity: Not on file  ?Stress: Not on file  ?Social Connections: Not on file  ?Intimate Partner Violence: Not on file  ?  ?Review of Systems ?General: No chills, fever, night sweats or weight changes  ?Cardiovascular:  No chest pain, dyspnea on exertion, edema, orthopnea, palpitations, paroxysmal nocturnal dyspnea ?Dermatological: No rash, lesions or masses ?Respiratory: No cough, dyspnea ?Urologic: No hematuria, dysuria ?Abdominal: No nausea, vomiting, diarrhea, bright red blood per rectum, melena, or hematemesis ?Neurologic: No visual changes, weakness, changes in mental status ?All other systems reviewed and are otherwise negative except as noted above. ? ?Physical Exam ?Blood pressure 120/80, pulse 70, height '5\' 2"'$  (1.575 m),  weight 143 lb (64.9 kg), SpO2 98 %.  ?General: Well developed, well appearing 81 y.o. female in no acute distress. ?HEENT: Normocephalic, atraumatic. EOMs intact. Sclera nonicteric. Oropharynx clear.  ?Neck: Supple without bruits. No JVD. ?Lungs: Respirations regular and unlabored, CTA bilaterally. No wheezes, rales or rhonchi. ?Heart: RRR. S1, S2 present. No murmurs, rub, S3 or S4. ?Abdomen: Soft, non-tender, non-distended. BS present x 4 quadrants. No hepatosplenomegaly.   ?Extremities: No clubbing, cyanosis or edema. DP/PT/Radials 2+ and equal bilaterally. ?Psych: Normal affect. ?Neuro: Alert and oriented X 3. Moves all extremities spontaneously.  Swelling of her left hand decreased movement of her left hand ?Musculoskeletal: No kyphosis. ?Skin: Intact. Warm and dry. No rashes or petechiae in exposed areas. ?  ?Labs ?Lab Results  ?Component Value Date  ? WBC 6.9 03/14/2021  ? HGB 11.5 (L) 03/14/2021  ? HCT 33.8 (L) 03/14/2021  ? MCV 88.5 03/14/2021  ? PLT 243 03/14/2021  ? No results for input(s): NA, K, CL, CO2, BUN, CREATININE, CALCIUM, PROT, BILITOT, ALKPHOS, ALT, AST, GLUCOSE in the last 168 hours. ? ?Invalid input(s): LABALBU ?No results for input(s): INR in the last 72 hours. ? ?Radiology/Studies ?No results found. ? ?  ? ?12-lead ECG sinus at 70 ?Telemetry reviewed from the hospitalization 12/22-VT nonsustained but no atrial arrhythmias ? ? ?Assessment and Plan ?Cryptogenic stroke ?Anemia ? ? ?Evaluation has been negative here to date looking for atrial fibrillation as a cause for the embolus associated with her stroke. ?We discussed the role of monitoring for afib in the setting of Cryptogenic Stroke and the lack of prospective data on anticoagulation in pts with SCAF identified on monitoring, the strong data for the role of anticoagulation in patients with known afib and antecedent stroke and the lack of data supporting the use of antiplatelet therapy in patients with atrial fibrillation for stroke prevention..  ? ?We have discussed the procedure itself is potential benefits as well as the risks largely limited to bleeding and infection.  She understands these risks and is willing to proceed ? ?Pre op Dx Cryptogenic Stroke  ?Post op Dx Same ? ?Procedure  Loop Recorder implantation ? ?After routine prep and drape of the left parasternal area, a small incision was created. A [employer] LINQ Reveal Loop Recorder  Serial Number  [id number] was inserted.   ? ?SteriStrip dressing was   applied. ? ?The patient tolerated the procedure without apparent complication. ? ?EBL < 10cc ? ? ?Signed, ?Virl Axe ?07/05/2021, 8:44 AM ? ? ? ?  ?

## 2021-07-04 ENCOUNTER — Ambulatory Visit (INDEPENDENT_AMBULATORY_CARE_PROVIDER_SITE_OTHER): Payer: Medicare PPO | Admitting: Internal Medicine

## 2021-07-04 ENCOUNTER — Ambulatory Visit: Payer: Medicare PPO | Admitting: Occupational Therapy

## 2021-07-04 ENCOUNTER — Ambulatory Visit: Payer: Medicare PPO

## 2021-07-04 ENCOUNTER — Encounter: Payer: Self-pay | Admitting: Internal Medicine

## 2021-07-04 ENCOUNTER — Encounter: Payer: Self-pay | Admitting: Occupational Therapy

## 2021-07-04 VITALS — BP 120/80 | HR 70 | Ht 62.0 in | Wt 143.0 lb

## 2021-07-04 DIAGNOSIS — M6281 Muscle weakness (generalized): Secondary | ICD-10-CM

## 2021-07-04 DIAGNOSIS — R262 Difficulty in walking, not elsewhere classified: Secondary | ICD-10-CM

## 2021-07-04 DIAGNOSIS — G8929 Other chronic pain: Secondary | ICD-10-CM

## 2021-07-04 DIAGNOSIS — I639 Cerebral infarction, unspecified: Secondary | ICD-10-CM

## 2021-07-04 DIAGNOSIS — R2681 Unsteadiness on feet: Secondary | ICD-10-CM | POA: Diagnosis not present

## 2021-07-04 DIAGNOSIS — R278 Other lack of coordination: Secondary | ICD-10-CM

## 2021-07-04 DIAGNOSIS — I63511 Cerebral infarction due to unspecified occlusion or stenosis of right middle cerebral artery: Secondary | ICD-10-CM

## 2021-07-04 NOTE — Therapy (Signed)
[place] [facility] ?[place], [place], [address] ?Phone: [phone]   Fax:  [phone] ? ?Occupational Therapy Treatment ? ?Patient Details  ?Name: Beretta Ginsberg [facility] ?MRN: [id number] ?Date of Birth: Mar 22, 1941 ?No data recorded ? ?Encounter Date: 07/04/2021 ? ? OT End of Session - 07/04/21 1820   ? ? Visit Number 12   ? Number of Visits 24   ? Date for OT Re-Evaluation 08/18/21   ? OT Start Time 1013   ? OT Stop Time 1100   ? OT Time Calculation (min) 47 min   ? Activity Tolerance Patient tolerated treatment well   ? Behavior During Therapy [facility] for tasks assessed/performed   ? ?  ?  ? ?  ? ? ?Past Medical History:  ?Diagnosis Date  ? 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 Hypothyroidism   ? Raynaud's disease   ? ? ?Past Surgical History:  ?Procedure Laterality Date  ? ABDOMINAL HYSTERECTOMY  1991  ? APPENDECTOMY  1991  ? BACK SURGERY  2010  ? Cervical fusion C4-5-6-7  ? CATARACT EXTRACTION, BILATERAL Bilateral 10/2016  ? CHOLECYSTECTOMY  1995  ? COLONOSCOPY WITH PROPOFOL N/A 12/08/2018  ? Procedure: COLONOSCOPY WITH PROPOFOL;  Surgeon: Toledo, Benay Pike, MD;  Location: [facility] ENDOSCOPY;  Service: Gastroenterology;  Laterality: N/A;  ? EYE SURGERY    ? ? ?There were no vitals filed for this visit. ? ? Subjective Assessment - 07/04/21 1818   ? ? Subjective  Pt reports her shoulder is hurting more today.   ? Pertinent History February 26, 2021, pt reports she had a CVA, came to [facility] to ER and then was transferred to [facility] in [place] where she was admitted and after acute care she went to inpatient rehab.  Following inpt rehab, pt went home and had home health.   ? Patient Stated Goals Pt reports her goal is to get back to normal, be as independent as possible.   ? Currently in Pain? Yes   ? Pain Score 3    ? Pain Location Arm   ? Pain Orientation Left   ? Pain Descriptors / Indicators Aching;Tightness   ? Pain Onset More than a month ago   ? Pain Frequency  Intermittent   ? Multiple Pain Sites No   ? ?  ?  ? ?  ? ? ?Pt not wearing compression glove this date in therapy, edema minimal today.   ? ?Manual Therapy: ?In sitting, manual soft tissue massage to bicep and upper trapezius to insertion point on neck to decrease pain and improve ROM followed by PROM movements of the shoulder within pain tolerances. ?In supine, Mobilization to left scapula for elevation, retraction and upwards rotation followed by AAROM with facilitation for same movements.  Soft tissue mobs to left hand and wrist, carpal spreads, radius on ulna mobs to impact edema in left hand and wrist as well as improve movement towards supination of the forearm.    ? ?Neuromuscular Reeducation: ?Pt seen for facilitation of active movement in left UE for shoulder flexion to 90 degrees in supine, place and hold for 3-5 secs, difficulty with motion this date due to shoulder pain.  Shoulder protraction for 2 sets of 5 reps each with assist to keep arm in 90 degrees of flexion.  Facilitation of Elbow extension with tapping to triceps for 2 sets of 5 reps with shoulder placed in 90 degrees of flexion.  Slow prolonged stretch for  ER.  ADD of arm reaching towards opposite shoulder with guiding from therapist.  Elbow extension with arm at her side with mild resistance from therapist. Supination/pronation with manual sstretch towards end ranges.   ?PROM to left hand with focus on PIP and DIP flexion within pain tolerance.   ?  ? ?Response to tx: ?Pt limited by pain this date in shoulder along bicep as well as upper traps and neck.  Reports pain started when she got up this date and has lasted for the morning.  Edema in left hand decreased this date, glove not worn during therapy but will plan to place it back on when she returns home.  Pt responds well to manual techniques and mobilizations.  Difficulty with ROM of shoulder this date due to pain and required additional assist from therapist for support of arm.  May  consider kinesiotaping of arm if pain persists.  She had a pain patch on this date.  Continue to work towards goals in plan of care to decrease edema, pain and improve ROM and function of left UE for use in ADL and IADL tasks.   ? ? ? ? ? ? ? ? ? ? ? ? ? ? ? ? ? ? ? ? OT Education - 07/04/21 1820   ? ? Education Details shoulder stabilization exercises in supine, grasp and release   ? Person(s) Educated Patient   ? Methods Explanation;Demonstration;Tactile cues;Verbal cues   ? Comprehension Verbalized understanding;Returned demonstration;Verbal cues required;Need further instruction;Tactile cues required   ? ?  ?  ? ?  ? ? ? ? ? ? OT Long Term Goals - 06/28/21 0904   ? ?  ? OT LONG TERM GOAL #1  ? Title Pt will be independent with home exercise program.   ? Baseline Eval: no current program, 10th visit:  continue to add new exercises as pt progresses   ? Time 12   ? Period Weeks   ? Status On-going   ? Target Date 08/18/21   ?  ? OT LONG TERM GOAL #2  ? Title Pt will complete UB and LB dressing with modified independence including buttons, snaps and zippers.   ? Baseline requires min assist at eval, 10th visit: occasional assist with buttons   ? Time 12   ? Period Weeks   ? Status On-going   ? Target Date 08/18/21   ?  ? OT LONG TERM GOAL #3  ? Title Pt will perform shower transfer with modified independence.   ? Baseline Pt requires supervision to min assist for shower transfer at home. 10th visit: supervision   ? Time 6   ? Period Weeks   ? Status On-going   ? Target Date 07/07/21   ?  ? OT LONG TERM GOAL #4  ? Title Pt will improve L hand grip by 10# to assist with holding items in left hand securely.   ? Baseline no grip in left hand at eval, 10th visit:  improved flexion but still working towards composite fisting and grip   ? Time 12   ? Period Weeks   ? Status On-going   ? Target Date 08/18/21   ?  ? OT LONG TERM GOAL #5  ? Title Pt will improve left shoulder flexion by 10 degrees to improve reaching to  obtain self care items from shelf/shoulder height.   ? Baseline difficulty with reach, shoulder flexion to 47 degrees.   ? Time 12   ? Period Weeks   ?  Status On-going   ? Target Date 07/07/21   ?  ? OT LONG TERM GOAL #6  ? Title Pt will improve FOTO score to 47 or above to demonstrate a clinically relevant change in function to impact ADL tasks.   ? Baseline score of 30 at eval   ? Time 12   ? Period Weeks   ? Status On-going   ? Target Date 08/18/21   ?  ? OT LONG TERM GOAL #7  ? Title Pt will demonstrate ability to pick up small objects and complete 9 hole peg test in less than 2 mins.   ? Baseline unable to perform at eval, 10th visit: still unable to pick up small pegs   ? Time 12   ? Period Weeks   ? Status On-going   ? Target Date 08/18/21   ? ?  ?  ? ?  ? ? ? ? ? ? ? ? Plan - 07/04/21 1821   ? ? Clinical Impression Statement Pt limited by pain this date in shoulder along bicep as well as upper traps and neck.  Reports pain started when she got up this date and has lasted for the morning.  Edema in left hand decreased this date, glove not worn during therapy but will plan to place it back on when she returns home.  Pt responds well to manual techniques and mobilizations.  Difficulty with ROM of shoulder this date due to pain and required additional assist from therapist for support of arm.  May consider kinesiotaping of arm if pain persists.  She had a pain patch on this date.  Continue to work towards goals in plan of care to decrease edema, pain and improve ROM and function of left UE for use in ADL and IADL tasks.   ? OT Occupational Profile and History Detailed Assessment- Review of Records and additional review of physical, cognitive, psychosocial history related to current functional performance   ? Occupational performance deficits (Please refer to evaluation for details): ADL's;IADL's;Leisure;Rest and Sleep   ? Body Structure / Function / Physical Skills ADL;Coordination;Endurance;GMC;UE functional  use;Balance;IADL;Pain;Dexterity;FMC;Strength;Edema;Mobility;ROM   ? Psychosocial Skills Environmental  Adaptations;Habits;Routines and Behaviors   ? Rehab Potential Good   ? Clinical Decision Making Several trea

## 2021-07-04 NOTE — Therapy (Signed)
[place] [facility] ?[place], [place], [address] ?Phone: [phone]   Fax:  [phone] ? ?Physical Therapy Treatment ? ?Patient Details  ?Name: Teresa [name] ?MRN: [id number] ?Date of Birth: Oct 15, 1940 ?Referring Provider (PT): [name], MD ? ? ?Encounter Date: 07/04/2021 ? ? PT End of Session - 07/04/21 1514   ? ? Visit Number 11   ? Number of Visits 25   ? Date for PT Re-Evaluation 08/16/21   ? PT Start Time 1102   ? PT Stop Time 1145   ? PT Time Calculation (min) 43 min   ? Equipment Utilized During Treatment Gait belt   ? Activity Tolerance Patient tolerated treatment well   ? Behavior During Therapy [name] for tasks assessed/performed   ? ?  ?  ? ?  ? ? ?Past Medical History:  ?Diagnosis Date  ? Cancer [facility])   ? skin  ? Hypothyroidism   ? Raynaud's disease   ? ? ?Past Surgical History:  ?Procedure Laterality Date  ? ABDOMINAL HYSTERECTOMY  1991  ? APPENDECTOMY  1991  ? BACK SURGERY  2010  ? Cervical fusion C4-5-6-7  ? CATARACT EXTRACTION, BILATERAL Bilateral 10/2016  ? CHOLECYSTECTOMY  1995  ? COLONOSCOPY WITH PROPOFOL N/A 12/08/2018  ? Procedure: COLONOSCOPY WITH PROPOFOL;  Surgeon: Toledo, Benay Pike, MD;  Location: [facility] ENDOSCOPY;  Service: Gastroenterology;  Laterality: N/A;  ? EYE SURGERY    ? ? ?There were no vitals filed for this visit. ? ? Subjective Assessment - 07/04/21 1103   ? ? Subjective Pt with some LUE pain currently. Pt reports no falls/stumbles. Pt thinks she rolled onto her L arm while sleeping and hurt it.   ? Pertinent History Pt is a 81 y.o. 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 Pt completed CIR from 03/02/21-03/22/21. PMH includes: skin cancer, hypothyroidism, and Raynaud's disease, history of cervical fusion in 2010 C4-C7.  Pt reports HH PT/OT/SLP was performed from January 2nd to February 28th with focus on ADL completion, ambulation. Pt's big goal tranisitioning to ambulating up to 3 miles a  day which pt was doing prior to stroke. Pt has been ambulating in [employer] with husband but last day or two pt has noticed increased L foot drag but states it is better today. Pt denies any falls at home or community. Pt reports balance deficits with turns primarily. States her balance is good with her quad cane. Wishing to return to full independence in ambulation with household and community ambulation. Wishing to be able to asc/desc full flight of stairs with ability to carry decorative items and other household items without need for handrail and/or AD.   ? Limitations Lifting;Standing;Walking;House hold activities   ? How long can you sit comfortably? unlimited   ? How long can you stand comfortably? unlimited   ? How long can you walk comfortably? ~ 1.5 hours   ? Patient Stated Goals Return to independent walking for 3 miles. Return to to crochet.   ? Currently in Pain? Yes   ? Pain Location Arm   ? Pain Orientation Left   ? Pain Onset More than a month ago   ? ?  ?  ? ?  ? ? ?  ?INTERVENTIONS:  ?Gait belt donned and CGA provided throughout unless otherwise specified  ? ?STS 15x hands-free  ?STS 8x with airex pad under BLES. Pt rates challenging due to balance challenge. ? ?At support surface: ?  Rocker board - Lateral weight shift and forward/backward weight shift x several reps for each ?  ?Half foam static stand 2x30 sec; intermittent UE support ?Half foam tandem stance intermittent UE support 2x30 sec each LE ?Ambulating in hallway x 60 ft without AD, emphasis on heel-toe pattern, decrease foot drag ?Ambulating down hallway without AD, pt reading sticky notes to incorporate vertical and horizontal head turns 2x 60 ft  ?Slow marches x 60 ft ? ?Obstacle course with cones, hurdle, half-foam, airex pad 4x through ?-with addition of dual cog task ?-with addition of dual motor task ?Comments: pt knocks over cones more frequently with addition of dual task, difficulty fully clearing hurdle ? ?  ?Cone taps with  increasing complexity of sequence (started out with two cones, progressed to four cones of different colors). Pt able to maintain balance at beginning, fatigues with reps and knocks over cones. Pt performs for several minutes.  ? ?Alternating toe taps onto 6" step, pt standing on airex pad. Pt uses intermittent UE support 2x20 alternating ? ?NBOS EC on airex 60 sec; intemrittent UE support ? ?  ?Education provided throughout session via VC/TC and demonstration to facilitate movement at target joints and correct muscle activation for all testing and exercises performed.  ? ? ? PT Education - 07/04/21 1514   ? ? Education Details exercise technique, body mechanics   ? Person(s) Educated Patient   ? Methods Explanation;Demonstration;Verbal cues   ? Comprehension Verbalized understanding;Returned demonstration;Verbal cues required;Need further instruction   ? ?  ?  ? ?  ? ? ? PT Short Term Goals - 06/29/21 1115   ? ?  ? PT SHORT TERM GOAL #1  ? Title Pt will be indep with HEP to improve balance, strength, and gait to optimize independence with ADL completion   ? Baseline 05/24/21: Initiated 4/7: to be advanced   ? Time 6   ? Period Weeks   ? Status On-going   ? Target Date 07/05/21   ? ?  ?  ? ?  ? ? ? ? PT Long Term Goals - 06/29/21 0932   ? ?  ? PT LONG TERM GOAL #1  ? Title Pt will improve FOTO to target score to 67 demonstrate clinically significant improvement in functional mobility   ? Baseline 05/24/21: next session 4/7: 64   ? Time 12   ? Period Weeks   ? Status On-going   ? Target Date 08/02/21   ?  ? PT LONG TERM GOAL #2  ? Title Pt will improve 5xSTS to 12 sec or less to indicate clinically significant improvement in LE strength   ? Baseline 05/24/21: 17.51 sec 4/7: 14.2 sec hands-free   ? Time 12   ? Period Weeks   ? Status Partially Met   ? Target Date 08/16/21   ?  ? PT LONG TERM GOAL #3  ? Title Pt will improve 6 MWT by 165' to indicate clinically significant improvement in community ambulation distance for  geriatric stroke norms.   ? Baseline 05/24/21: 765' 4/6: 842 ft with SPC   ? Time 12   ? Period Weeks   ? Status On-going   ? Target Date 08/16/21   ?  ? PT LONG TERM GOAL #4  ? Title Pt will improve 10 m gait speed to at least 1.0 m/s to demonstrate reduced risk of falls with community ambulation tasks.   ? Baseline 05/24/21: .63 m/s without AD 4/6: 0.83 without an AD   ?   Time 12   ? Period Weeks   ? Status On-going   ? Target Date 08/16/21   ?  ? PT LONG TERM GOAL #5  ? Title Pt will improve FGA  by at least 5 point to demonstrate clinically signifcant reduced risk of falls with dynamic balance/ambulation tasks.   ? Baseline 05/24/21: 17; 4/7: 22   ? Time 12   ? Period Weeks   ? Status Achieved   ? Target Date 08/16/21   ? ?  ?  ? ?  ? ? ? ? ? ? ? ? Plan - 07/04/21 1711   ? ? Clinical Impression Statement Pt highly motivated to participate in session. She tolerates interventions well without pain and without reports of fatigue. Pt able to advance to more challenging balance interventions that involve dual cog and motor tasks. Pt also ambulates without AD during session (but with CGA). While pt shows progress, pt with increased difficulty navigating cones, clearing obstacles with addition of dual task. The pt will benefit from further skilled PT to improve strenth, gait, balance and mobility.   ? Personal Factors and Comorbidities Age;Time since onset of injury/illness/exacerbation;Comorbidity 3+;Past/Current Experience   ? Examination-Activity Limitations Squat;Stairs;Locomotion Level;Stand   ? Examination-Participation Restrictions Shop;Community Activity;Yard Work   ? Stability/Clinical Decision Making Evolving/Moderate complexity   ? Rehab Potential Good   ? PT Frequency 2x / week   ? PT Duration 12 weeks   ? PT Treatment/Interventions ADLs/Self Care Home Management;Canalith Repostioning;Electrical Stimulation;DME Instruction;Gait training;Stair training;Functional mobility training;Therapeutic  activities;Patient/family education;Therapeutic exercise;Balance training;Neuromuscular re-education;Energy conservation;Vestibular;Passive range of motion   ? PT Next Visit Plan Progress LE strength, Gait training - heel to toe sequen

## 2021-07-04 NOTE — Patient Instructions (Addendum)
Medication Instructions:  ?- Your physician recommends that you continue on your current medications as directed. Please refer to the Current Medication list given to you today. ? ?*If you need a refill on your cardiac medications before your next appointment, please call your pharmacy* ? ? ?Lab Work: ?- none ordered ? ?If you have labs (blood work) drawn today and your tests are completely normal, you will receive your results only by: ?MyChart Message (if you have MyChart) OR ?A paper copy in the mail ?If you have any lab test that is abnormal or we need to change your treatment, we will call you to review the results. ? ? ?Testing/Procedures: ?- none ordered ? ? ?Follow-Up: ?At [facility], you and your health needs are our priority.  As part of our continuing mission to provide you with exceptional heart care, we have created designated Provider Care Teams.  These Care Teams include your primary Cardiologist (physician) and Advanced Practice Providers (APPs -  Physician Assistants and Nurse Practitioners) who all work together to provide you with the care you need, when you need it. ? ?We recommend signing up for the patient portal called "MyChart".  Sign up information is provided on this After Visit Summary.  MyChart is used to connect with patients for Virtual Visits (Telemedicine).  Patients are able to view lab/test results, encounter notes, upcoming appointments, etc.  Non-urgent messages can be sent to your provider as well.   ?To learn more about what you can do with MyChart, go to [email or web address].   ? ?Your next appointment:   ?As needed  ? ?The format for your next appointment:   ?In Person ? ?Provider:   ?Virl Axe, MD  ? ? ?Other Instructions ?- Post Loop Recorder implant instructions: ? ?1) You may shower tomorrow night ?2) You may remove your tegaderm (top) dressing on Day 4 post procedure: Sunday 07/08/21  ?3) You may remove your steri strips on Day 6 (Tuesday 07/10/21) post procedure  if they have not fallen off on their own. ?4) If you have any bleeding issues or concerns with your implant site after getting home, please call our [facility] directly at [phone]. ? ? ?Important Information About Sugar ? ? ? ? ? ? ?

## 2021-07-05 ENCOUNTER — Ambulatory Visit: Payer: Medicare PPO | Admitting: Occupational Therapy

## 2021-07-05 ENCOUNTER — Encounter: Payer: Self-pay | Admitting: Occupational Therapy

## 2021-07-05 ENCOUNTER — Ambulatory Visit: Payer: Medicare PPO | Admitting: Speech Pathology

## 2021-07-05 ENCOUNTER — Ambulatory Visit: Payer: Medicare PPO

## 2021-07-05 DIAGNOSIS — R278 Other lack of coordination: Secondary | ICD-10-CM

## 2021-07-05 DIAGNOSIS — I63511 Cerebral infarction due to unspecified occlusion or stenosis of right middle cerebral artery: Secondary | ICD-10-CM

## 2021-07-05 DIAGNOSIS — R1312 Dysphagia, oropharyngeal phase: Secondary | ICD-10-CM

## 2021-07-05 DIAGNOSIS — M6281 Muscle weakness (generalized): Secondary | ICD-10-CM

## 2021-07-05 DIAGNOSIS — R2681 Unsteadiness on feet: Secondary | ICD-10-CM | POA: Diagnosis not present

## 2021-07-05 DIAGNOSIS — R471 Dysarthria and anarthria: Secondary | ICD-10-CM

## 2021-07-05 DIAGNOSIS — G8929 Other chronic pain: Secondary | ICD-10-CM

## 2021-07-05 NOTE — Therapy (Signed)
[place] [facility] ?[place], [place], [address] ?Phone: [phone]   Fax:  [phone] ? ?Physical Therapy Treatment ? ?Patient Details  ?Name: Teresa [name] [facility] ?MRN: [id number] ?Date of Birth: 1940-06-01 ?Referring Provider (PT): [name], MD ? ? ?Encounter Date: 07/05/2021 ? ? PT End of Session - 07/05/21 1210   ? ? Visit Number 12   ? Number of Visits 25   ? Date for PT Re-Evaluation 08/16/21   ? PT Start Time [phone]   ? PT Stop Time 1015   ? PT Time Calculation (min) 39 min   ? Equipment Utilized During Treatment Gait belt   ? Activity Tolerance Patient tolerated treatment well   ? Behavior During Therapy [facility] for tasks assessed/performed   ? ?  ?  ? ?  ? ? ?Past Medical History:  ?Diagnosis Date  ? Cancer [facility])   ? skin  ? Hypothyroidism   ? Raynaud's disease   ? ? ?Past Surgical History:  ?Procedure Laterality Date  ? ABDOMINAL HYSTERECTOMY  1991  ? APPENDECTOMY  1991  ? BACK SURGERY  2010  ? Cervical fusion C4-5-6-7  ? CATARACT EXTRACTION, BILATERAL Bilateral 10/2016  ? CHOLECYSTECTOMY  1995  ? COLONOSCOPY WITH PROPOFOL N/A 12/08/2018  ? Procedure: COLONOSCOPY WITH PROPOFOL;  Surgeon: Toledo, Benay Pike, MD;  Location: [facility] ENDOSCOPY;  Service: Gastroenterology;  Laterality: N/A;  ? EYE SURGERY    ? ? ?There were no vitals filed for this visit. ? ? Subjective Assessment - 07/05/21 0936   ? ? Subjective Pt reports she had "chip" put in that will monitor her heart. Pt reports it is a little tender where they placed chip on her chest. Pt reports no stumbles/falls.   ? Pertinent History Pt is a 81 y.o. 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 Pt completed CIR from 03/02/21-03/22/21. PMH includes: skin cancer, hypothyroidism, and Raynaud's disease, history of cervical fusion in 2010 C4-C7.  Pt reports HH PT/OT/SLP was performed from January 2nd to February 28th with focus on ADL completion, ambulation. Pt's big goal  tranisitioning to ambulating up to 3 miles a day which pt was doing prior to stroke. Pt has been ambulating in [employer] with husband but last day or two pt has noticed increased L foot drag but states it is better today. Pt denies any falls at home or community. Pt reports balance deficits with turns primarily. States her balance is good with her quad cane. Wishing to return to full independence in ambulation with household and community ambulation. Wishing to be able to asc/desc full flight of stairs with ability to carry decorative items and other household items without need for handrail and/or AD.   ? Limitations Lifting;Standing;Walking;House hold activities   ? How long can you sit comfortably? unlimited   ? How long can you stand comfortably? unlimited   ? How long can you walk comfortably? ~ 1.5 hours   ? Patient Stated Goals Return to independent walking for 3 miles. Return to to crochet.   ? Currently in Pain? Yes   pt reports tenderness from chip placement  ? Pain Onset More than a month ago   ? ?  ?  ? ?  ? ? ? ? ? ?INTERVENTIONS:  ? ?Gait belt donned and CGA provided throughout unless otherwise specified  ? ? ?Therex-  ? ?STS  2x10. Pt cued for parallel LE positioning and to decrease knee valgus.  ?  ?  Seated adductor squeezes with ball 10x with 3 sec hold per rep ? ?Stability ball rollouts forward/backward x 10 reps (PT supported LUE) ? ?  ? ?NMR: ?Obstacle course with cones (5), hurdle. Pt to navigate around cones and step over hurdle without stopping x 4 times through ?-with addition of dual cog task 2 times through ?-with addition of dual motor task 4 times through ?Comments: pt knocks over cone x1, few instances of foot drag noted throughout. Improved performance from previous session. ?  ?LE cone taps to progress SLB - tapping 1-3 cones at a time. Pt knocks over cones 3x, decreased postural stability, uses frequent UE support. Pt rates medium. ? ?NBOS EC on airex 2x60 sec; one instance of LOB. Pt uses  UE support to regain balance, PT provides min a.  ? ?Darts balance intervention (velcro darts). Pt completes in NBOS and semi-tandem (each LE) x several minutes. Pt exhibits increased sway. Uses UE support on rail 2x.  ?  ?Education provided throughout session via VC/TC and demonstration to facilitate movement at target joints and correct muscle activation for all testing and exercises performed.  ?  ? ? ? PT Education - 07/05/21 1210   ? ? Education Details exercise technique, body mechanics   ? Person(s) Educated Patient   ? Methods Explanation;Demonstration;Tactile cues;Verbal cues   ? Comprehension Verbalized understanding;Returned demonstration;Verbal cues required;Need further instruction   ? ?  ?  ? ?  ? ? ? PT Short Term Goals - 06/29/21 1115   ? ?  ? PT SHORT TERM GOAL #1  ? Title Pt will be indep with HEP to improve balance, strength, and gait to optimize independence with ADL completion   ? Baseline 05/24/21: Initiated 4/7: to be advanced   ? Time 6   ? Period Weeks   ? Status On-going   ? Target Date 07/05/21   ? ?  ?  ? ?  ? ? ? ? PT Long Term Goals - 06/29/21 0932   ? ?  ? PT LONG TERM GOAL #1  ? Title Pt will improve FOTO to target score to 67 demonstrate clinically significant improvement in functional mobility   ? Baseline 05/24/21: next session 4/7: 64   ? Time 12   ? Period Weeks   ? Status On-going   ? Target Date 08/02/21   ?  ? PT LONG TERM GOAL #2  ? Title Pt will improve 5xSTS to 12 sec or less to indicate clinically significant improvement in LE strength   ? Baseline 05/24/21: 17.51 sec 4/7: 14.2 sec hands-free   ? Time 12   ? Period Weeks   ? Status Partially Met   ? Target Date 08/16/21   ?  ? PT LONG TERM GOAL #3  ? Title Pt will improve 6 MWT by 165' to indicate clinically significant improvement in community ambulation distance for geriatric stroke norms.   ? Baseline 05/24/21: 765' 4/6: 842 ft with SPC   ? Time 12   ? Period Weeks   ? Status On-going   ? Target Date 08/16/21   ?  ? PT LONG  TERM GOAL #4  ? Title Pt will improve 10 m gait speed to at least 1.0 m/s to demonstrate reduced risk of falls with community ambulation tasks.   ? Baseline 05/24/21: .63 m/s without AD 4/6: 0.83 without an AD   ? Time 12   ? Period Weeks   ? Status On-going   ? Target Date  08/16/21   ?  ? PT LONG TERM GOAL #5  ? Title Pt will improve FGA  by at least 5 point to demonstrate clinically signifcant reduced risk of falls with dynamic balance/ambulation tasks.   ? Baseline 05/24/21: 17; 4/7: 22   ? Time 12   ? Period Weeks   ? Status Achieved   ? Target Date 08/16/21   ? ?  ?  ? ?  ? ? ? ? ? ? ? ? Plan - 07/05/21 1215   ? ? Clinical Impression Statement Pt with improved obstacle course performance compared to prior session. She made fewer erros with addition of dual task and only knocked over a cone 1x. While pt shows progress, she is most challenged with SLB cone tapping activity, requiring frequent UE support. The pt will benefit from further skilled PT to improve gait, balance and mobility.   ? Personal Factors and Comorbidities Age;Time since onset of injury/illness/exacerbation;Comorbidity 3+;Past/Current Experience   ? Examination-Activity Limitations Squat;Stairs;Locomotion Level;Stand   ? Examination-Participation Restrictions Shop;Community Activity;[employer] Work   ? Stability/Clinical Decision Making Evolving/Moderate complexity   ? Rehab Potential Good   ? PT Frequency 2x / week   ? PT Duration 12 weeks   ? PT Treatment/Interventions ADLs/Self Care Home Management;Canalith Repostioning;Electrical Stimulation;DME Instruction;Gait training;Stair training;Functional mobility training;Therapeutic activities;Patient/family education;Therapeutic exercise;Balance training;Neuromuscular re-education;Energy conservation;Vestibular;Passive range of motion   ? PT Next Visit Plan Progress LE strength, Gait training - heel to toe sequencing, progress balance as appropriate. Continue plan   ? PT Home Exercise Plan Seated L ankle  DF; 3/10 Access Code: [email or web address]; no updates   ? Consulted and Agree with Plan of Care Patient   ? ?  ?  ? ?  ? ? ?Patient will benefit from skilled therapeutic intervention in order to improve the following deficits an

## 2021-07-05 NOTE — Therapy (Signed)
[place] ?[facility] [facility] ?[place], [place], [address] ?Phone: [phone]   Fax:  [phone] ? ?Occupational Therapy Treatment ? ?Patient Details  ?Name: Teresa [name] [facility] ?MRN: [id number] ?Date of Birth: [birth year] ?No data recorded ? ?Encounter Date: 07/05/2021 ? ? OT End of Session - 07/05/21 1003   ? ? Visit Number 13   ? Number of Visits 24   ? Date for OT Re-Evaluation 08/18/21   ? OT Start Time 1015   ? OT Stop Time 1100   ? OT Time Calculation (min) 45 min   ? Activity Tolerance Patient tolerated treatment well   ? Behavior During Therapy [facility] for tasks assessed/performed   ? ?  ?  ? ?  ? ? ?Past Medical History:  ?Diagnosis Date  ? Cancer [facility])   ? skin  ? Hypothyroidism   ? Raynaud's disease   ? ? ?Past Surgical History:  ?Procedure Laterality Date  ? ABDOMINAL HYSTERECTOMY  1991  ? APPENDECTOMY  1991  ? BACK SURGERY  2010  ? Cervical fusion C4-5-6-7  ? CATARACT EXTRACTION, BILATERAL Bilateral 10/2016  ? CHOLECYSTECTOMY  1995  ? COLONOSCOPY WITH PROPOFOL N/A 12/08/2018  ? Procedure: COLONOSCOPY WITH PROPOFOL;  Surgeon: Toledo, Benay Pike, MD;  Location: [facility] ENDOSCOPY;  Service: Gastroenterology;  Laterality: N/A;  ? EYE SURGERY    ? ? ?There were no vitals filed for this visit. ? ? Subjective Assessment - 07/05/21 1001   ? ? Subjective  Pt reports getting a chip implanted to monitor her A-fib.   ? Pertinent History February 26, 2021, pt reports she had a CVA, came to [facility] to ER and then was transferred to [facility] in [place] where she was admitted and after acute care she went to inpatient rehab.  Following inpt rehab, pt went home and had home health.   ? Patient Stated Goals Pt reports her goal is to get back to normal, be as independent as possible.   ? Currently in Pain? Yes   ? Pain Score 3    ? Pain Location Arm   ? Pain Orientation Left   ? Pain Descriptors / Indicators Aching   ? Pain Type Chronic pain   ? Pain Onset More than a  month ago   ? ?  ?  ? ?  ? ? ? ? ? ?Neuromuscular Reeducation ?Pt worked on pinch strengthening in the left hand for 2 and 3pt pinch using yellow and red, resistive clips. Requires SETUP placing clip at edge of table and therapist stabilizing clip for pt to pinch effectively. Pt places clips at ~1 and ~2 inch heights on stick and removes with increased time.  ? ?Therapeutic Exercise ?A/AROM LUE for shoulder flexion to 70* unassisted and 90* assisted. Tolerates x10 reps external rotation,  horizontal adduction, elbow flexion/extension in supine with pain in L neck/shoulder during flexion. PROM to left hand with focus on PIP and DIP flexion within pain tolerance.   ?  ? ? ? ? OT Education - 07/05/21 1002   ? ? Education Details shoulder stabilization exercises in supine, grasp and release   ? Person(s) Educated Patient   ? Methods Explanation;Demonstration;Tactile cues;Verbal cues   ? Comprehension Verbalized understanding;Returned demonstration;Verbal cues required;Need further instruction;Tactile cues required   ? ?  ?  ? ?  ? ? ? ? ? ? OT Long Term Goals - 06/28/21 0904   ? ?  ? OT LONG TERM GOAL #1  ?  Title Pt will be independent with home exercise program.   ? Baseline Eval: no current program, 10th visit:  continue to add new exercises as pt progresses   ? Time 12   ? Period Weeks   ? Status On-going   ? Target Date 08/18/21   ?  ? OT LONG TERM GOAL #2  ? Title Pt will complete UB and LB dressing with modified independence including buttons, snaps and zippers.   ? Baseline requires min assist at eval, 10th visit: occasional assist with buttons   ? Time 12   ? Period Weeks   ? Status On-going   ? Target Date 08/18/21   ?  ? OT LONG TERM GOAL #3  ? Title Pt will perform shower transfer with modified independence.   ? Baseline Pt requires supervision to min assist for shower transfer at home. 10th visit: supervision   ? Time 6   ? Period Weeks   ? Status On-going   ? Target Date 07/07/21   ?  ? OT LONG TERM GOAL #4   ? Title Pt will improve L hand grip by 10# to assist with holding items in left hand securely.   ? Baseline no grip in left hand at eval, 10th visit:  improved flexion but still working towards composite fisting and grip   ? Time 12   ? Period Weeks   ? Status On-going   ? Target Date 08/18/21   ?  ? OT LONG TERM GOAL #5  ? Title Pt will improve left shoulder flexion by 10 degrees to improve reaching to obtain self care items from shelf/shoulder height.   ? Baseline difficulty with reach, shoulder flexion to 47 degrees.   ? Time 12   ? Period Weeks   ? Status On-going   ? Target Date 07/07/21   ?  ? OT LONG TERM GOAL #6  ? Title Pt will improve FOTO score to 47 or above to demonstrate a clinically relevant change in function to impact ADL tasks.   ? Baseline score of 30 at eval   ? Time 12   ? Period Weeks   ? Status On-going   ? Target Date 08/18/21   ?  ? OT LONG TERM GOAL #7  ? Title Pt will demonstrate ability to pick up small objects and complete 9 hole peg test in less than 2 mins.   ? Baseline unable to perform at eval, 10th visit: still unable to pick up small pegs   ? Time 12   ? Period Weeks   ? Status On-going   ? Target Date 08/18/21   ? ?  ?  ? ?  ? ? ? ? ? ? ? ? Plan - 07/05/21 1004   ? ? Clinical Impression Statement Pt reports no pain at rest however pain with mobility. Pt presents wearing edema glove, doffs and dons with SETUP assist to turn glove right side out, signifcantly incresaed time to complete. Improved pinch stregth tolerating yellow and red resistive clips to place on stick at ~ 1 and ~2 inch heights. Continue to work towards goals in plan of care to decrease edema, pain and improve ROM and function of left UE for use in ADL and IADL tasks.   ? OT Occupational Profile and History Detailed Assessment- Review of Records and additional review of physical, cognitive, psychosocial history related to current functional performance   ? Occupational performance deficits (Please refer to  evaluation for details): ADL's;IADL's;Leisure;Rest  and Sleep   ? Body Structure / Function / Physical Skills ADL;Coordination;Endurance;GMC;UE functional use;Balance;IADL;Pain;Dexterity;FMC;Strength;Edema;Mobility;ROM   ? Psychosocial Skills Environmental  Adaptations;Habits;Routines and Behaviors   ? Rehab Potential Good   ? Clinical Decision Making Several treatment options, min-mod task modification necessary   ? Comorbidities Affecting Occupational Performance: May have comorbidities impacting occupational performance   ? Modification or Assistance to Complete Evaluation  Min-Moderate modification of tasks or assist with assess necessary to complete eval   ? OT Frequency 2x / week   ? OT Duration 12 weeks   ? OT Treatment/Interventions Self-care/ADL training;Cryotherapy;Paraffin;Therapeutic exercise;DME and/or AE instruction;Functional Mobility Training;Balance training;Electrical Stimulation;Ultrasound;Neuromuscular education;Manual Therapy;Splinting;Moist Heat;Contrast Bath;Passive range of motion;Therapeutic activities;Patient/family education;Coping strategies training   ? Consulted and Agree with Plan of Care Patient   ? ?  ?  ? ?  ? ? ?Patient will benefit from skilled therapeutic intervention in order to improve the following deficits and impairments:   ?Body Structure / Function / Physical Skills: ADL, Coordination, Endurance, GMC, UE functional use, Balance, IADL, Pain, Dexterity, FMC, Strength, Edema, Mobility, ROM ?  ?Psychosocial Skills: Environmental  Adaptations, Habits, Routines and Behaviors ? ? ?Visit Diagnosis: ?Muscle weakness (generalized) ? ?Chronic left shoulder pain ? ?Other lack of coordination ? ? ? ?Problem List ?Patient Active Problem List  ? Diagnosis Date Noted  ? 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 Stroke [facility]) 02/27/2021  ? History of nonmelanoma skin cancer 05/23/2014  ? ? ?Dessie Coma, M.S. OTR/L  ?07/05/21, 11:15 AM  ?ascom [phone] ? ?[facility] [facility] ?[place], [place], [address] ?Phone: [phone]   Fax:  [phone] ? ?Name: Sharis Keeran [facility] ?MRN: [id number] ?Date of Birth: 1940/04/24 ? ?

## 2021-07-06 NOTE — Therapy (Signed)
[place] ?[facility] ?[place], [place], [address] ?Phone: [phone]   Fax:  [phone] ? ?Speech Language Pathology Evaluation ? ?Patient Details  ?Name: Denijah Karrer [facility] ?MRN: [id number] ?Date of Birth: 11/20/1940 ?Referring Provider (SLP): [name] ? ? ?Encounter Date: 07/05/2021 ? ? End of Session - 07/06/21 0734   ? ? Visit Number 1   ? Number of Visits 25   ? Date for SLP Re-Evaluation 09/28/21   ? Authorization Type [employer] Medicare Choice PPO   ? Authorization Time Period 07/05/2021 thru 09/28/2021   ? Authorization - Visit Number 1   ? Progress Note Due on Visit 10   ? SLP Start Time 1100   ? SLP Stop Time  1140   ? SLP Time Calculation (min) 40 min   ? Activity Tolerance Patient tolerated treatment well   ? ?  ?  ? ?  ? ? ?Past Medical History:  ?Diagnosis Date  ? Cancer [facility])   ? skin  ? Hypothyroidism   ? Raynaud's disease   ? ? ?Past Surgical History:  ?Procedure Laterality Date  ? ABDOMINAL HYSTERECTOMY  1991  ? APPENDECTOMY  1991  ? BACK SURGERY  2010  ? Cervical fusion C4-5-6-7  ? CATARACT EXTRACTION, BILATERAL Bilateral 10/2016  ? CHOLECYSTECTOMY  1995  ? COLONOSCOPY WITH PROPOFOL N/A 12/08/2018  ? Procedure: COLONOSCOPY WITH PROPOFOL;  Surgeon: Toledo, Benay Pike, MD;  Location: [facility] ENDOSCOPY;  Service: Gastroenterology;  Laterality: N/A;  ? EYE SURGERY    ? ? ?There were no vitals filed for this visit. ? ? Subjective Assessment - 07/06/21 0728   ? ? Subjective pt pleasant, good historian   ? Currently in Pain? No/denies   ? ?  ?  ? ?  ? ? ? ? ? SLP Evaluation [facility] - 07/06/21 7517   ? ?  ? SLP Visit Information  ? SLP Received On 07/05/21   ? Referring Provider (SLP) [name]   ? Onset Date 02/26/2021   referral 06/21/2021  ? Medical Diagnosis Oropharyngeal dysphagia d/t R MCA   ?  ? General Information  ? HPI Pt is a 81 y.o. 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 Pt completed CIR from 03/02/21-03/22/21.  PMH includes: skin cancer, hypothyroidism, and Raynaud's disease, history of cervical fusion in 2010 C4-C7.  Pt reports HH PT/OT/SLP was performed from January 2nd to February 28th with focus on ADL completion, ambulation.   ? Behavioral/Cognition appropriate, good historian   ? Mobility Status ambulatory with single point cane   ?  ? Balance Screen  ? Has the patient fallen in the past 6 months No   ? Has the patient had a decrease in activity level because of a fear of falling?  No   ? Is the patient reluctant to leave their home because of a fear of falling?  No   ?  ? Prior Functional Status  ? Cognitive/Linguistic Baseline Within functional limits   ? Type of Home House   ?  Lives With Spouse   ? Available Support Family;Friend(s)   ?  ? Cognition  ? Overall Cognitive Status Within Functional Limits for tasks assessed   ?  ? Auditory Comprehension  ? Overall Auditory Comprehension Appears within functional limits for tasks assessed   ?  ? Visual Recognition/Discrimination  ? Discrimination Within Function Limits   ?  ? Reading Comprehension  ? Reading Status Within  funtional limits   ?  ? Expression  ? Primary Mode of Expression Verbal   ?  ? Verbal Expression  ? Overall Verbal Expression Appears within functional limits for tasks assessed   ?  ? Written Expression  ? Dominant Hand Right   ? Written Expression Not tested   ?  ? Oral Motor/Sensory Function  ? Overall Oral Motor/Sensory Function Impaired   ? Labial ROM Reduced left   ? Labial Symmetry Abnormal symmetry left   ? Labial Strength Reduced Left   ? Labial Sensation Reduced Left   ? Labial Coordination Reduced   ? Lingual ROM Reduced left   ? Lingual Symmetry Abnormal symmetry left   ? Lingual Strength Reduced Left   ? Lingual Sensation Reduced Left   ? Lingual Coordination Reduced   ? Facial ROM Reduced left   ? Facial Symmetry Left droop   ? Facial Strength Reduced   ? Facial Sensation Reduced   ? Facial Coordination Reduced   ? Velum Within  Functional Limits   ? Mandible Within Functional Limits   ?  ? Motor Speech  ? Overall Motor Speech Impaired   ? Respiration Within functional limits   ? Phonation Normal   ? Resonance Within functional limits   ? Articulation Impaired   ? Level of Impairment Sentence   ? Intelligibility Intelligibility reduced   ? Word 75-100% accurate   ? Phrase 75-100% accurate   ? Sentence 75-100% accurate   ? Conversation 75-100% accurate   ? Motor Planning Witnin functional limits   ? Motor Speech Errors Not applicable   ?  ? Standardized Assessments  ? Standardized Assessments  Other Assessment   Clinical Swallow Evaluation  ? ?  ?  ? ?  ? ? ? ? ? ? ? ? ? ? ? ? ? ? ? ? ? ? SLP Education - 07/06/21 0733   ? ? Education Details ST POC, repeat instrumental swallow study   ? Person(s) Educated Patient   ? Methods Explanation;Demonstration   ? Comprehension Verbalized understanding;Returned demonstration   ? ?  ?  ? ?  ? ? ? SLP Short Term Goals - 07/06/21 0750   ? ?  ? SLP SHORT TERM GOAL #1  ? Title Pt will utilize speech intelligibility strategies to achieve > 95% speech intelligibility at the sentence level.   ? Baseline < 90%   ? Time 10   ? Period --   sessions  ? Status New   ?  ? SLP SHORT TERM GOAL #2  ? Title Pt will perform pharyngeal strengthening exercises with rare Min A.   ? Baseline new goal   ? Time 10   ? Period --   sessions  ? Status New   ?  ? SLP SHORT TERM GOAL #3  ? Title Pt will participate in instrumental swallow study.   ? Baseline new goal   ? Time 10   ? Period --   sessions  ? Status New   ? ?  ?  ? ?  ? ? ? SLP Long Term Goals - 07/06/21 0752   ? ?  ? SLP LONG TERM GOAL #1  ? Title Pt will be Mod I for use of speech intelligibility strategies to achieve > 95% speech intelligibility across multiple settings and communication partners.   ? Baseline 85%   ? Time 12   ? Period Weeks   ? Status New   ? Target Date  09/28/21   ? ?  ?  ? ?  ? ? ? Plan - 07/06/21 0735   ? ? Clinical Impression Statement Pt  presents today with continued concern for oropharyngeal dysphagia as she reports increased s/s of aspiration when consuming food and liquids.      ? ?Pt with previous MBSS on 02/28/2021 which revealed moderate oropharyngeal dysphagia with sensory and motor impairments resulting in sensed aspiration of thin liquids and silent aspiration of nectar thick liquids. Recommended dysphagia 1 diet with nectar thick liquids, head turn to LEFT with chin tuck.      ? ?During this evaluation pt continues to present with moderate left sided facial weakness, anterior spillage of salvia, pocketing of food on her left and as well as wet vocal quality, and multiple delayed throat clears after consuming thin liquids. All of which are concerning for possible continued delayed swallow initiation and inability to protect airway.      ? 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 While pt's speech intelligibility is 90% at the simple conversation level, this is much impaired when compared to baseline abilities.      ? ?At this time, recommend scheduling another instrumental swallow study to assess swallow function and ability to protect her airway when consuming foods and liquids. Also recommend skilled ST intervention to target her moderate dysarthria to improve functional communication for return to community activities and improve quality of life.   ? Speech Therapy Frequency 2x / week   ? Duration 12 weeks   ? Treatment/Interventions Aspiration precaution training;Pharyngeal strengthening exercises;Oral motor exercises;Compensatory strategies;Patient/family education;SLP instruction and feedback;Diet toleration management by SLP;Trials of upgraded texture/liquids   ? Potential to Achieve Goals Good   ? Consulted and Agree with Plan of Care Patient   ? ?  ?  ? ?  ? ? ?Patient will benefit from skilled therapeutic intervention in  order to improve the following deficits and impairments:   ?Dysphagia, oropharyngeal phase ? ?Dysarthria and anarthria ? ?Right middle cerebral artery stroke [facility]) ? ? ? ?Problem List ?Patient Active Problem Nicoletta Dress

## 2021-07-10 ENCOUNTER — Ambulatory Visit: Payer: Medicare PPO

## 2021-07-10 ENCOUNTER — Ambulatory Visit: Payer: Medicare PPO | Admitting: Speech Pathology

## 2021-07-10 ENCOUNTER — Ambulatory Visit: Payer: Medicare PPO | Admitting: Occupational Therapy

## 2021-07-10 DIAGNOSIS — R262 Difficulty in walking, not elsewhere classified: Secondary | ICD-10-CM

## 2021-07-10 DIAGNOSIS — M6281 Muscle weakness (generalized): Secondary | ICD-10-CM

## 2021-07-10 DIAGNOSIS — R278 Other lack of coordination: Secondary | ICD-10-CM

## 2021-07-10 DIAGNOSIS — I63511 Cerebral infarction due to unspecified occlusion or stenosis of right middle cerebral artery: Secondary | ICD-10-CM

## 2021-07-10 DIAGNOSIS — R1311 Dysphagia, oral phase: Secondary | ICD-10-CM

## 2021-07-10 DIAGNOSIS — R269 Unspecified abnormalities of gait and mobility: Secondary | ICD-10-CM

## 2021-07-10 DIAGNOSIS — R482 Apraxia: Secondary | ICD-10-CM

## 2021-07-10 DIAGNOSIS — R2689 Other abnormalities of gait and mobility: Secondary | ICD-10-CM

## 2021-07-10 DIAGNOSIS — R2681 Unsteadiness on feet: Secondary | ICD-10-CM

## 2021-07-10 DIAGNOSIS — R471 Dysarthria and anarthria: Secondary | ICD-10-CM

## 2021-07-10 DIAGNOSIS — G8929 Other chronic pain: Secondary | ICD-10-CM

## 2021-07-10 NOTE — Patient Instructions (Signed)
HEP (exercises completed this date) ?Compensatory strategies discussed ? ?

## 2021-07-10 NOTE — Therapy (Signed)
[place] ?[facility] ?[place], [place], [address] ?Phone: [phone]   Fax:  [phone] ? ?Speech Language Pathology Treatment ? ?Patient Details  ?Name: Teresa [name] [facility] ?MRN: [id number] ?Date of Birth: Nov 08, 1940 ?Referring Provider (SLP): [name] ? ? ?Encounter Date: 07/10/2021 ? ? End of Session - 07/10/21 1243   ? ? Visit Number 2   ? Number of Visits 25   ? Date for SLP Re-Evaluation 09/28/21   ? Authorization Type [employer] Medicare Choice PPO   ? Authorization Time Period 07/05/2021 thru 09/28/2021   ? Authorization - Visit Number 2   ? Progress Note Due on Visit 10   ? SLP Start Time 1100   ? SLP Stop Time  1200   ? SLP Time Calculation (min) 60 min   ? Activity Tolerance Patient tolerated treatment well;No increased pain   ? ?  ?  ? ?  ? ? ?Past Medical History:  ?Diagnosis Date  ? Cancer [facility]   ? skin  ? Hypothyroidism   ? Raynaud's disease   ? ? ?Past Surgical History:  ?Procedure Laterality Date  ? ABDOMINAL HYSTERECTOMY  1991  ? APPENDECTOMY  1991  ? BACK SURGERY  2010  ? Cervical fusion C4-5-6-7  ? CATARACT EXTRACTION, BILATERAL Bilateral 10/2016  ? CHOLECYSTECTOMY  1995  ? COLONOSCOPY WITH PROPOFOL N/A 12/08/2018  ? Procedure: COLONOSCOPY WITH PROPOFOL;  Surgeon: Toledo, Benay Pike, MD;  Location: [facility] ENDOSCOPY;  Service: Gastroenterology;  Laterality: N/A;  ? EYE SURGERY    ? ? ?There were no vitals filed for this visit. ? ? Subjective Assessment - 07/10/21 1220   ? ? Subjective Patient reported anterior loss of AM coffee to severe degree "it all went down my shirt, I was soaked"   ? Currently in Pain? No/denies   ? ?  ?  ? ?  ? ? ? ? ? ? ? ? ADULT SLP TREATMENT - 07/10/21 0001   ? ?  ? Treatment Provided  ? Treatment provided Dysphagia;Cognitive-Linquistic   ?  ? Dysphagia Treatment  ? Temperature Spikes Noted No   ? Respiratory Status Room air   ? Oral Cavity - Dentition Adequate natural dentition   ? Treatment Methods Therapeutic  exercise;Compensation strategy training;Patient/caregiver education;Skilled observation   ? Patient observed directly with PO's Yes   ? Type of PO's observed Regular;Thin liquids   ? Feeding Able to feed self   ? Liquids provided via Cup   ? Amount of cueing Independent   ? Other treatment/comments no s/s aspiration; no s/s oral deficits though notably patient already utilizing compensatory strategies: slow rate, small single bites/sips, R bolus presentation and lingual/digital sweeps to check for pocketing--in a structured monitored environment. Suspect increased oral deficits with reduced compensatory strategy use in the home (unstructured) environment as that is when patient reports anterior loss, pocketing, and choking/gagging during the oral phase.   ?  ? Pain Assessment  ? Pain Assessment No/denies pain   ?  ? Cognitive-Linquistic Treatment  ? Skilled Treatment Oral strengthening targeted for improved oral dysphagia and dysarthria. Targeted with x3 sets of x59mnute isometric orbicularis and buccal "squeeze". x3 sets of x10 repetitions for isotonic exercise of orbicularis and buccal "squeeze" x3 sets of x10 repetitions for isotonic exercise lingual lateralizations to the weaker left side.  Trained compensatory dysarthria strategies   ?  ? Assessment / Recommendations / Plan  ? Plan Continue with current plan of care;MBS   ? ?  ?  ? ?  ? ? ?  SLP Education - 07/10/21 1243   ? ? Education Details dysphagia exercises and compensatory strategies   ? Person(s) Educated Patient   ? Methods Explanation;Demonstration   ? Comprehension Verbalized understanding;Need further instruction   ? ?  ?  ? ?  ? ? ? SLP Short Term Goals - 07/06/21 0750   ? ?  ? SLP SHORT TERM GOAL #1  ? Title Pt will utilize speech intelligibility strategies to achieve > 95% speech intelligibility at the sentence level.   ? Baseline < 90%   ? Time 10   ? Period --   sessions  ? Status New   ?  ? SLP SHORT TERM GOAL #2  ? Title Pt will perform  pharyngeal strengthening exercises with rare Min A.   ? Baseline new goal   ? Time 10   ? Period --   sessions  ? Status New   ?  ? SLP SHORT TERM GOAL #3  ? Title Pt will participate in instrumental swallow study.   ? Baseline new goal   ? Time 10   ? Period --   sessions  ? Status New   ? ?  ?  ? ?  ? ? ? SLP Long Term Goals - 07/06/21 0752   ? ?  ? SLP LONG TERM GOAL #1  ? Title Pt will be Mod I for use of speech intelligibility strategies to achieve > 95% speech intelligibility across multiple settings and communication partners.   ? Baseline 85%   ? Time 12   ? Period Weeks   ? Status New   ? Target Date 09/28/21   ? ?  ?  ? ?  ? ? ? Plan - 07/10/21 1244   ? ? Clinical Impression Statement SLP follow up intervention targeting oral dysphagia as reported by patient: anterior spillage of salvia, pocketing of food on her left iso mild left sided facial weakness. Trained and educated swallowing exercises as above with good participation and muscular engagement. Continue to recommend scheduling another instrumental swallow study to assess swallow function and ability to protect her airway when consuming foods and liquids--currently pending MD order. Also recommend skilled ST intervention to target her oral dysphagia and dysarthria to improve functional communication for return to community activities and improve quality of life.   ? Speech Therapy Frequency 2x / week   ? Duration 12 weeks   ? Potential to Achieve Goals Good   ? Consulted and Agree with Plan of Care Patient   ? ?  ?  ? ?  ? ? ?Patient will benefit from skilled therapeutic intervention in order to improve the following deficits and impairments:   ?Dysphagia, oral phase ? ?Dysarthria and anarthria ? ? ? ?Problem List ?Patient Active Problem List  ? Diagnosis Date Noted  ? 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 Stroke [facility]) 02/27/2021  ? History of nonmelanoma skin cancer 05/23/2014  ? ?Tanzania L. Cambren Helm, M.A. CCC-SLP ?Adult-based  Speech Language Pathologist ?[place] ?[phone] ? ? ?Dalbert Batman, CCC-SLP ?07/10/2021, 12:50 PM ? ?[place] ?[facility] ?[place], [place], [address] ?Phone: [phone]   Fax:  [phone] ? ? ?Name: Tanaysia Bhardwaj [facility] ?MRN: [id number] ?Date of Birth: [birth year] ? ?

## 2021-07-10 NOTE — Therapy (Signed)
[place] ?[facility] [facility] ?[place], [place], [address] ?Phone: [phone]   Fax:  [phone] ? ?Physical Therapy Treatment ? ?Patient Details  ?Name: Teresa [name] [facility] ?MRN: [id number] ?Date of Birth: 1940-05-17 ?Referring Provider (PT): [name], MD ? ? ?Encounter Date: 07/10/2021 ? ? PT End of Session - 07/10/21 0945   ? ? Visit Number 13   ? Number of Visits 25   ? Date for PT Re-Evaluation 08/16/21   ? Authorization Type [employer] Medicare PPO-   ? Authorization Time Period Cert 09/28/80-07/16/51   ? Progress Note Due on Visit 20   ? PT Start Time 6144   ? PT Stop Time 1012   ? PT Time Calculation (min) 40 min   ? Equipment Utilized During Treatment Gait belt   ? Activity Tolerance Patient tolerated treatment well;No increased pain   ? Behavior During Therapy [facility] for tasks assessed/performed   ? ?  ?  ? ?  ? ? ?Past Medical History:  ?Diagnosis Date  ? Cancer [facility])   ? skin  ? Hypothyroidism   ? Raynaud's disease   ? ? ?Past Surgical History:  ?Procedure Laterality Date  ? ABDOMINAL HYSTERECTOMY  1991  ? APPENDECTOMY  1991  ? BACK SURGERY  2010  ? Cervical fusion C4-5-6-7  ? CATARACT EXTRACTION, BILATERAL Bilateral 10/2016  ? CHOLECYSTECTOMY  1995  ? COLONOSCOPY WITH PROPOFOL N/A 12/08/2018  ? Procedure: COLONOSCOPY WITH PROPOFOL;  Surgeon: Toledo, Benay Pike, MD;  Location: [facility] ENDOSCOPY;  Service: Gastroenterology;  Laterality: N/A;  ? EYE SURGERY    ? ? ?There were no vitals filed for this visit. ? ? Subjective Assessment - 07/10/21 0934   ? ? Subjective Pt reports doing well today, her right sciatica continues to be aggravated, bu tno worse than last week. HEP is going well.   ? Pertinent History Pt is a 81 y.o. 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 Pt completed CIR from 03/02/21-03/22/21. PMH includes: skin cancer, hypothyroidism, and Raynaud's disease, history of cervical fusion in 2010 C4-C7.  Pt reports HH PT/OT/SLP was  performed from January 2nd to February 28th with focus on ADL completion, ambulation. Pt's big goal tranisitioning to ambulating up to 3 miles a day which pt was doing prior to stroke. Pt has been ambulating in [employer] with husband but last day or two pt has noticed increased L foot drag but states it is better today. Pt denies any falls at home or community. Pt reports balance deficits with turns primarily. States her balance is good with her quad cane. Wishing to return to full independence in ambulation with household and community ambulation. Wishing to be able to asc/desc full flight of stairs with ability to carry decorative items and other household items without need for handrail and/or AD.   ? Patient Stated Goals Return to independent walking for 3 miles. Return to to crochet.   ? Currently in Pain? Yes   ? Pain Score 2    Rt PSIS area  ? ?  ?  ? ?  ? ? ?INTERVENTION:  ?overground AMB c SPC, 431f, s SPC 2020f ?STS from plinth 1x10, hands free ?LLE seated marching 1cx15  ?STS from plinth RLE on airex 1x10, cues for mechnics  ? ?MFR to right postero latral gluteals 4x45sec ?P/ROM Rt ip FABER to subjective stretch level 2x30sec ?Seated Revearse clam, YTB at ankles, yellow ball at knees to keep  joint space open ? ?Left ankle weight 2.5lb  ?Left right side stepping, fwd, backward AMB, counterclokwise cirlces, cues to exaggerate foot clearance left  ? ? ? ? ? ? ? ? PT Education - 07/10/21 0947   ? ? Education Details transfers stratgeies for low surfaces   ? Person(s) Educated Patient   ? Methods Explanation   ? Comprehension Verbalized understanding;Returned demonstration   ? ?  ?  ? ?  ? ? ? PT Short Term Goals - 06/29/21 1115   ? ?  ? PT SHORT TERM GOAL #1  ? Title Pt will be indep with HEP to improve balance, strength, and gait to optimize independence with ADL completion   ? Baseline 05/24/21: Initiated 4/7: to be advanced   ? Time 6   ? Period Weeks   ? Status On-going   ? Target Date 07/05/21   ? ?  ?  ? ?   ? ? ? ? PT Long Term Goals - 06/29/21 0932   ? ?  ? PT LONG TERM GOAL #1  ? Title Pt will improve FOTO to target score to 67 demonstrate clinically significant improvement in functional mobility   ? Baseline 05/24/21: next session 4/7: 64   ? Time 12   ? Period Weeks   ? Status On-going   ? Target Date 08/02/21   ?  ? PT LONG TERM GOAL #2  ? Title Pt will improve 5xSTS to 12 sec or less to indicate clinically significant improvement in LE strength   ? Baseline 05/24/21: 17.51 sec 4/7: 14.2 sec hands-free   ? Time 12   ? Period Weeks   ? Status Partially Met   ? Target Date 08/16/21   ?  ? PT LONG TERM GOAL #3  ? Title Pt will improve 6 MWT by 165' to indicate clinically significant improvement in community ambulation distance for geriatric stroke norms.   ? Baseline 05/24/21: 765' 4/6: 842 ft with SPC   ? Time 12   ? Period Weeks   ? Status On-going   ? Target Date 08/16/21   ?  ? PT LONG TERM GOAL #4  ? Title Pt will improve 10 m gait speed to at least 1.0 m/s to demonstrate reduced risk of falls with community ambulation tasks.   ? Baseline 05/24/21: .63 m/s without AD 4/6: 0.83 without an AD   ? Time 12   ? Period Weeks   ? Status On-going   ? Target Date 08/16/21   ?  ? PT LONG TERM GOAL #5  ? Title Pt will improve FGA  by at least 5 point to demonstrate clinically signifcant reduced risk of falls with dynamic balance/ambulation tasks.   ? Baseline 05/24/21: 17; 4/7: 22   ? Time 12   ? Period Weeks   ? Status Achieved   ? Target Date 08/16/21   ? ?  ?  ? ?  ? ? ? ? ? ? ? ? Plan - 07/10/21 0948   ? ? Clinical Impression Statement Conitnued to work on hemiplegic gait training and strengthening. Pt has persistent Right pelvis pain near PSIS, responds well to sustained release, stretch, and activation. Discussed role of Rt trendelenburg compounding other post CVA LLE clearance issues.   ? Personal Factors and Comorbidities Age;Time since onset of injury/illness/exacerbation;Comorbidity 3+;Past/Current Experience   ?  Examination-Activity Limitations Squat;Stairs;Locomotion Level;Stand   ? Examination-Participation Restrictions Shop;Community Activity;[place] Work   ? Stability/Clinical Decision Making Evolving/Moderate complexity   ?  Clinical Decision Making Moderate   ? Rehab Potential Good   ? PT Frequency 2x / week   ? PT Duration 12 weeks   ? PT Treatment/Interventions ADLs/Self Care Home Management;Canalith Repostioning;Electrical Stimulation;DME Instruction;Gait training;Stair training;Functional mobility training;Therapeutic activities;Patient/family education;Therapeutic exercise;Balance training;Neuromuscular re-education;Energy conservation;Vestibular;Passive range of motion   ? PT Next Visit Plan Progress LE strength, Gait training - heel to toe sequencing, progress balance as appropriate. Continue plan   ? PT Home Exercise Plan Seated L ankle DF; 3/10 Access Code: [email or web address]; no updates   ? Consulted and Agree with Plan of Care Patient   ? ?  ?  ? ?  ? ? ?Patient will benefit from skilled therapeutic intervention in order to improve the following deficits and impairments:  Abnormal gait, Pain, Decreased mobility, Decreased activity tolerance, Decreased endurance, Decreased strength, Decreased balance, Difficulty walking ? ?Visit Diagnosis: ?Right middle cerebral artery stroke (HCC) ? ?Muscle weakness (generalized) ? ?Chronic left shoulder pain ? ?Other lack of coordination ? ?Unsteadiness on feet ? ?Difficulty in walking, not elsewhere classified ? ?Apraxia ? ?Abnormality of gait and mobility ? ?Other abnormalities of gait and mobility ? ? ? ? ?Problem List ?Patient Active Problem List  ? Diagnosis Date Noted  ? Right middle cerebral artery stroke ([place]) 03/01/2021  ? Stroke [facility]) 02/27/2021  ? History of nonmelanoma skin cancer 05/23/2014  ? ?10:20 AM, 07/10/21 ?Etta Grandchild, PT, DPT ?Physical Therapist - [place] ?[facility]  ?Outpatient Physical Therapy- [place] ?[phone]     ? ?Satchel Heidinger C, PT ?07/10/2021, 9:53 AM ? ?[place] ?[place] [facility] ?[place], [place], [address] ?Phone: [phone]   Fax:  [phone] ? ?Name: Yoshiye Kraft [facility]

## 2021-07-11 ENCOUNTER — Ambulatory Visit: Payer: Medicare PPO | Admitting: Speech Pathology

## 2021-07-11 DIAGNOSIS — R2681 Unsteadiness on feet: Secondary | ICD-10-CM | POA: Diagnosis not present

## 2021-07-11 DIAGNOSIS — R1311 Dysphagia, oral phase: Secondary | ICD-10-CM

## 2021-07-11 DIAGNOSIS — R471 Dysarthria and anarthria: Secondary | ICD-10-CM

## 2021-07-11 NOTE — Therapy (Signed)
[place] ?[facility] ?[place], [place], [address] ?Phone: [phone]   Fax:  [phone] ? ?Speech Language Pathology Treatment ? ?Patient Details  ?Name: Teresa [name] [facility] ?MRN: [id number] ?Date of Birth: May 03, 1940 ?Referring Provider (SLP): [name] ? ? ?Encounter Date: 07/11/2021 ? ? End of Session - 07/11/21 1232   ? ? Visit Number 3   ? Number of Visits 25   ? Date for SLP Re-Evaluation 09/28/21   ? Authorization Type [employer] Medicare Choice PPO   ? Authorization Time Period 07/05/2021 thru 09/28/2021   ? Authorization - Visit Number 3   ? Progress Note Due on Visit 10   ? SLP Start Time 1000   ? SLP Stop Time  1100   ? SLP Time Calculation (min) 60 min   ? Activity Tolerance Patient tolerated treatment well;No increased pain   ? ?  ?  ? ?  ? ? ?Past Medical History:  ?Diagnosis Date  ? Cancer [facility])   ? skin  ? Hypothyroidism   ? Raynaud's disease   ? ? ?Past Surgical History:  ?Procedure Laterality Date  ? ABDOMINAL HYSTERECTOMY  1991  ? APPENDECTOMY  1991  ? BACK SURGERY  2010  ? Cervical fusion C4-5-6-7  ? CATARACT EXTRACTION, BILATERAL Bilateral 10/2016  ? CHOLECYSTECTOMY  1995  ? COLONOSCOPY WITH PROPOFOL N/A 12/08/2018  ? Procedure: COLONOSCOPY WITH PROPOFOL;  Surgeon: Toledo, Benay Pike, MD;  Location: [facility] ENDOSCOPY;  Service: Gastroenterology;  Laterality: N/A;  ? EYE SURGERY    ? ? ?There were no vitals filed for this visit. ? ? Subjective Assessment - 07/11/21 1221   ? ? Subjective Patient brought in home mug to further assess most often occuring anterior loss   ? Currently in Pain? No/denies   ? ?  ?  ? ?  ? ? ? ? ? ? ? ? ADULT SLP TREATMENT - 07/11/21 0001   ? ?  ? Treatment Provided  ? Treatment provided Dysphagia;Cognitive-Linquistic   ?  ? Dysphagia Treatment  ? Treatment Methods Skilled observation;Therapeutic exercise;Compensation strategy training;Patient/caregiver education   ? Pharyngeal Phase Signs & Symptoms Immediate cough    ? Other treatment/comments Patient demonstrated thin liquid sips from home mug as is reported with increased anterior loss.Patient demonstrated 60% acc with only 2/5 anterior loss on initial set. 40% acc on second set x5 iso patient taking seequential sips. Patient improved on the 3rd set with 100% accuracy (no loss) x5 single sips with moderate cueing during completion to take small single sips and squeeze lips to cup.    During trials x1 immediate cough mostly likely r/t discoordination as did not persist with continued trials. Patient reported this does occur from time to time however given ongoing intake of reg diet/thin liquids with stable pulmonary status suspect patient is able to manage with minimal occurences. Educated patient re: s/s aspiration compensatory strategies to reduce, and aspiration pna prevention strategies including increased and thorough oral care. Given that patient is mobile, independent for intake/hygience, and cognitively able to comply with recommended strategies preventoin precautions she is at low risk of aspiration pna.   ?  ? Pain Assessment  ? Pain Assessment No/denies pain   ?  ? Cognitive-Linquistic Treatment  ? Skilled Treatment Oral strengthening targeted for reduced oral dysphagia and dysarthria with improved speech. Targeted with x3 sets of x79mnute isometric orbicularis and buccal "squeeze". x3 sets of x10 repetitions for isotonic exercise of orbicularis and buccal "squeeze"  Trained compensatory dysarthria strategies at the sentence level with SLP modeled production. Specifically targeted over articulation as rate and volume are appropriate.   ? ?  ?  ? ?  ? ? ? SLP Education - 07/11/21 1231   ? ? Education Details strategies   ? Person(s) Educated Patient   ? Methods Explanation;Demonstration   ? Comprehension Verbalized understanding;Need further instruction;Verbal cues required   ? ?  ?  ? ?  ? ? ? SLP Short Term Goals - 07/06/21 0750   ? ?  ? SLP SHORT TERM GOAL #1  ?  Title Pt will utilize speech intelligibility strategies to achieve > 95% speech intelligibility at the sentence level.   ? Baseline < 90%   ? Time 10   ? Period --   sessions  ? Status New   ?  ? SLP SHORT TERM GOAL #2  ? Title Pt will perform pharyngeal strengthening exercises with rare Min A.   ? Baseline new goal   ? Time 10   ? Period --   sessions  ? Status New   ?  ? SLP SHORT TERM GOAL #3  ? Title Pt will participate in instrumental swallow study.   ? Baseline new goal   ? Time 10   ? Period --   sessions  ? Status New   ? ?  ?  ? ?  ? ? ? SLP Long Term Goals - 07/06/21 0752   ? ?  ? SLP LONG TERM GOAL #1  ? Title Pt will be Mod I for use of speech intelligibility strategies to achieve > 95% speech intelligibility across multiple settings and communication partners.   ? Baseline 85%   ? Time 12   ? Period Weeks   ? Status New   ? Target Date 09/28/21   ? ?  ?  ? ?  ? ? ? Plan - 07/11/21 1232   ? ? Clinical Impression Statement SLP follow up intervention targeting oral dysphagia and dysarthria as reported by patient: anterior spillage of salvia and slurred speech iso mild left sided facial weakness. Trained and educated swallowing exercises as above with good participation and muscular engagement. Continue to recommend scheduling another instrumental swallow study to assess pathophysiology and determine POC r/t physiology--currently pending MD order. Also recommend skilled ST intervention to target her oral dysphagia and dysarthria to improve functional communication for return to community activities and improve quality of life.   ? Speech Therapy Frequency 2x / week   ? Duration 12 weeks   ? Treatment/Interventions Aspiration precaution training;Pharyngeal strengthening exercises;Oral motor exercises;Compensatory strategies;Patient/family education;SLP instruction and feedback;Diet toleration management by SLP;Trials of upgraded texture/liquids   ? Potential to Achieve Goals Good   ? Consulted and Agree with  Plan of Care Patient   ? ?  ?  ? ?  ? ? ?Patient will benefit from skilled therapeutic intervention in order to improve the following deficits and impairments:   ?Dysarthria and anarthria ? ?Dysphagia, oral phase ? ? ? ?Problem List ?Patient Active Problem List  ? Diagnosis Date Noted  ? Right middle cerebral artery stroke ([place]) 03/01/2021  ? Stroke [facility]) 02/27/2021  ? History of nonmelanoma skin cancer 05/23/2014  ? ?[place] L. Tessy Pawelski, M.A. CCC-SLP ?Adult-based Speech Language Pathologist ?[place] ?[phone] ? ? ?Dalbert Batman, CCC-SLP ?07/11/2021, 12:34 PM ? ?[place] ?[facility] ?[place], [place], [address] ?Phone: [phone]  Fax:  [phone] ? ? ?Name: Raylea Adcox [facility] ?MRN: [id number] ?Date of Birth: 09-13-1940 ? ?

## 2021-07-11 NOTE — Patient Instructions (Signed)
Compensatory swallow strategies: ?Small single sips ?Squeeze lips to cup for mug/cup drinking ? ?Aspiration pna prevention strategies ?Oral care minimum 2x/day (3-4 even better) ?Upright in chair for intake ?Mobility as able per PT/MD ?Cough hard when urge presents ? ?Dysarthria strategies: ?Talk loud ?Talk slower ?Really move your mouth ?

## 2021-07-12 ENCOUNTER — Other Ambulatory Visit: Payer: Self-pay | Admitting: Internal Medicine

## 2021-07-12 DIAGNOSIS — I69354 Hemiplegia and hemiparesis following cerebral infarction affecting left non-dominant side: Secondary | ICD-10-CM

## 2021-07-13 ENCOUNTER — Encounter: Payer: Self-pay | Admitting: Occupational Therapy

## 2021-07-13 ENCOUNTER — Ambulatory Visit: Payer: Medicare PPO | Admitting: Speech Pathology

## 2021-07-13 ENCOUNTER — Ambulatory Visit: Payer: Medicare PPO

## 2021-07-13 DIAGNOSIS — R278 Other lack of coordination: Secondary | ICD-10-CM

## 2021-07-13 DIAGNOSIS — R2681 Unsteadiness on feet: Secondary | ICD-10-CM

## 2021-07-13 DIAGNOSIS — M6281 Muscle weakness (generalized): Secondary | ICD-10-CM

## 2021-07-13 DIAGNOSIS — R269 Unspecified abnormalities of gait and mobility: Secondary | ICD-10-CM

## 2021-07-13 DIAGNOSIS — G8929 Other chronic pain: Secondary | ICD-10-CM

## 2021-07-13 DIAGNOSIS — R262 Difficulty in walking, not elsewhere classified: Secondary | ICD-10-CM

## 2021-07-13 NOTE — Therapy (Addendum)
[place] ?[facility] ?[place], [place], [address] ?Phone: [phone]   Fax:  [phone] ? ?Occupational Therapy Treatment ? ?Patient Details  ?Name: Teresa [name] [facility] ?MRN: [id number] ?Date of Birth: January 09, 1941 ?No data recorded ? ?Encounter Date: 07/10/2021 ? ? OT End of Session - 07/13/21 0819   ? ? Visit Number 14   ? Number of Visits 24   ? Date for OT Re-Evaluation 08/18/21   ? OT Start Time 1015   ? OT Stop Time 1059   ? OT Time Calculation (min) 44 min   ? Activity Tolerance Patient tolerated treatment well   ? Behavior During Therapy [facility] for tasks assessed/performed   ? ?  ?  ? ?  ? ? ?Past Medical History:  ?Diagnosis Date  ? Cancer [facility])   ? skin  ? Hypothyroidism   ? Raynaud's disease   ? ? ?Past Surgical History:  ?Procedure Laterality Date  ? ABDOMINAL HYSTERECTOMY  1991  ? APPENDECTOMY  1991  ? BACK SURGERY  2010  ? Cervical fusion C4-5-6-7  ? CATARACT EXTRACTION, BILATERAL Bilateral 10/2016  ? CHOLECYSTECTOMY  1995  ? COLONOSCOPY WITH PROPOFOL N/A 12/08/2018  ? Procedure: COLONOSCOPY WITH PROPOFOL;  Surgeon: Toledo, Benay Pike, MD;  Location: [facility] ENDOSCOPY;  Service: Gastroenterology;  Laterality: N/A;  ? EYE SURGERY    ? ? ?There were no vitals filed for this visit. ? ? Subjective Assessment - 07/13/21 0817   ? ? Subjective  Pt reports she is planning to go to the [facility] for a musical tonight with some friends.   ? Pertinent History February 26, 2021, pt reports she had a CVA, came to [facility] to ER and then was transferred to [facility] in [place] where she was admitted and after acute care she went to inpatient rehab.  Following inpt rehab, pt went home and had home health.   ? Patient Stated Goals Pt reports her goal is to get back to normal, be as independent as possible.   ? Currently in Pain? No/denies   ? Pain Score 0-No pain   ? ?  ?  ? ?  ? ?Therapeutic Exercises: ?Pt seen for Tabletop ROM exercises with use of  washcloth under hand and reaching in multi directions to wipe table, front to back, side to side and in both circular directions.  ?1/10 pain left shoulder towards posterior of joint today. ?Pt seen for manual skills to left hand, wrist and forearm for edema control.  Metacarpal spreads, spreads to web space, retrograde massage to digits, hand and forearm.   ?Finger ROM both passive and active with IP flexion of thumb.   ? ?[place] discs grasp and release, stack 3 at a time and remove.  Cues for little finger dragging on table.  ? ? ?Response to tx: ?Pt continues to progress well, pain at times.  Edema continues to improve in hand and digits.  Continues to wear her compression glove and knowledgeable regarding positioning.  Pt has progressed with active motion of the wrist and hand and now able to pick up larger items with a more secure grip.  Continue to work towards goals in plan of care to maximize safety and independence in necessary daily tasks.  ? ?  ? ? ? ? ? ? ? ? ? ? ? ? ? ? ? ? ? ? ? ? OT Education - 07/13/21 0819   ? ? Education Details tabletop ROM exercises,  grasp and release   ? Person(s) Educated Patient   ? Methods Explanation;Demonstration;Tactile cues;Verbal cues   ? Comprehension Verbalized understanding;Returned demonstration;Verbal cues required;Need further instruction;Tactile cues required   ? ?  ?  ? ?  ? ? ? ? ? ? OT Long Term Goals - 06/28/21 0904   ? ?  ? OT LONG TERM GOAL #1  ? Title Pt will be independent with home exercise program.   ? Baseline Eval: no current program, 10th visit:  continue to add new exercises as pt progresses   ? Time 12   ? Period Weeks   ? Status On-going   ? Target Date 08/18/21   ?  ? OT LONG TERM GOAL #2  ? Title Pt will complete UB and LB dressing with modified independence including buttons, snaps and zippers.   ? Baseline requires min assist at eval, 10th visit: occasional assist with buttons   ? Time 12   ? Period Weeks   ? Status On-going   ? Target Date  08/18/21   ?  ? OT LONG TERM GOAL #3  ? Title Pt will perform shower transfer with modified independence.   ? Baseline Pt requires supervision to min assist for shower transfer at home. 10th visit: supervision   ? Time 6   ? Period Weeks   ? Status On-going   ? Target Date 07/07/21   ?  ? OT LONG TERM GOAL #4  ? Title Pt will improve L hand grip by 10# to assist with holding items in left hand securely.   ? Baseline no grip in left hand at eval, 10th visit:  improved flexion but still working towards composite fisting and grip   ? Time 12   ? Period Weeks   ? Status On-going   ? Target Date 08/18/21   ?  ? OT LONG TERM GOAL #5  ? Title Pt will improve left shoulder flexion by 10 degrees to improve reaching to obtain self care items from shelf/shoulder height.   ? Baseline difficulty with reach, shoulder flexion to 47 degrees.   ? Time 12   ? Period Weeks   ? Status On-going   ? Target Date 07/07/21   ?  ? OT LONG TERM GOAL #6  ? Title Pt will improve FOTO score to 47 or above to demonstrate a clinically relevant change in function to impact ADL tasks.   ? Baseline score of 30 at eval   ? Time 12   ? Period Weeks   ? Status On-going   ? Target Date 08/18/21   ?  ? OT LONG TERM GOAL #7  ? Title Pt will demonstrate ability to pick up small objects and complete 9 hole peg test in less than 2 mins.   ? Baseline unable to perform at eval, 10th visit: still unable to pick up small pegs   ? Time 12   ? Period Weeks   ? Status On-going   ? Target Date 08/18/21   ? ?  ?  ? ?  ? ? ? ? ? ? ? ? Plan - 07/13/21 0820   ? ? Clinical Impression Statement Pt continues to progress well, pain at times.  Edema continues to improve in hand and digits.  Continues to wear her compression glove and knowledgeable regarding positioning.  Pt has progressed with active motion of the wrist and hand and now able to pick up larger items with a more secure grip.  Continue  to work towards goals in plan of care to maximize safety and independence in  necessary daily tasks.   ? OT Occupational Profile and History Detailed Assessment- Review of Records and additional review of physical, cognitive, psychosocial history related to current functional performance   ? Occupational performance deficits (Please refer to evaluation for details): ADL's;IADL's;Leisure;Rest and Sleep   ? Body Structure / Function / Physical Skills ADL;Coordination;Endurance;GMC;UE functional use;Balance;IADL;Pain;Dexterity;FMC;Strength;Edema;Mobility;ROM   ? Psychosocial Skills Environmental  Adaptations;Habits;Routines and Behaviors   ? Rehab Potential Good   ? Clinical Decision Making Several treatment options, min-mod task modification necessary   ? Comorbidities Affecting Occupational Performance: May have comorbidities impacting occupational performance   ? Modification or Assistance to Complete Evaluation  Min-Moderate modification of tasks or assist with assess necessary to complete eval   ? OT Frequency 2x / week   ? OT Duration 12 weeks   ? OT Treatment/Interventions Self-care/ADL training;Cryotherapy;Paraffin;Therapeutic exercise;DME and/or AE instruction;Functional Mobility Training;Balance training;Electrical Stimulation;Ultrasound;Neuromuscular education;Manual Therapy;Splinting;Moist Heat;Contrast Bath;Passive range of motion;Therapeutic activities;Patient/family education;Coping strategies training   ? Consulted and Agree with Plan of Care Patient   ? ?  ?  ? ?  ? ? ?Patient will benefit from skilled therapeutic intervention in order to improve the following deficits and impairments:   ?Body Structure / Function / Physical Skills: ADL, Coordination, Endurance, GMC, UE functional use, Balance, IADL, Pain, Dexterity, FMC, Strength, Edema, Mobility, ROM ?  ?Psychosocial Skills: Environmental  Adaptations, Habits, Routines and Behaviors ? ? ?Visit Diagnosis: ?Muscle weakness (generalized) ? ?Chronic left shoulder pain ? ? ? ?Problem List ?Patient Active Problem List  ? Diagnosis  Date Noted  ? Right middle cerebral artery stroke ([place]) 03/01/2021  ? Stroke [facility]) 02/27/2021  ? History of nonmelanoma skin cancer 05/23/2014  ? ?Lowen Mansouri T Jaedan Huttner, OTR/L, CLT ? ?Keean Wilmeth, OT ?07/13/2021, 10:0

## 2021-07-13 NOTE — Therapy (Signed)
[place] ?[facility] ?[place], [place], [address] ?Phone: [phone]   Fax:  [phone] ? ?Physical Therapy Treatment ? ?Patient Details  ?Name: Teresa [name] ?MRN: [id number] ?Date of Birth: 02/22/1941 ?Referring Provider (Teresa [name]): [name], MD ? ? ?Encounter Date: 07/13/2021 ? ? Teresa [name] End of Session - 07/13/21 3953   ? ? Visit Number 14   ? Number of Visits 25   ? Date for Teresa [name] Re-Evaluation 08/16/21   ? Authorization Type [employer] Medicare PPO-   ? Authorization Time Period Cert 2/0/23-3/43/56   ? Progress Note Due on Visit 20   ? Teresa [name] Start Time 0930   ? Teresa [name] Stop Time 1010   ? Teresa [name] Time Calculation (min) 40 min   ? Equipment Utilized During Treatment Gait belt   ? Activity Tolerance Patient tolerated treatment well;No increased pain   ? Behavior During Therapy [name] for tasks assessed/performed   ? ?  ?  ? ?  ? ? ?Past Medical History:  ?Diagnosis Date  ? Cancer [facility])   ? skin  ? Hypothyroidism   ? Raynaud's disease   ? ? ?Past Surgical History:  ?Procedure Laterality Date  ? ABDOMINAL HYSTERECTOMY  1991  ? APPENDECTOMY  1991  ? BACK SURGERY  2010  ? Cervical fusion C4-5-6-7  ? CATARACT EXTRACTION, BILATERAL Bilateral 10/2016  ? CHOLECYSTECTOMY  1995  ? COLONOSCOPY WITH PROPOFOL N/A 12/08/2018  ? Procedure: COLONOSCOPY WITH PROPOFOL;  Surgeon: Toledo, Benay Pike, MD;  Location: [facility] ENDOSCOPY;  Service: Gastroenterology;  Laterality: N/A;  ? EYE SURGERY    ? ? ?There were no vitals filed for this visit. ? ? Subjective Assessment - 07/13/21 0935   ? ? Subjective Teresa [name] doing well today. She is concerned about her continued pain in he rback getting out of bed in the morning. She went for her first walk in neighborhood with neighbor, but she did not take her cane because she doesn't like her cane, but her foot did end up dragging on the way back. Teresa [name] reports myofascial release to Rt PSIS area provided full pain resolution after session and today.   ? Pertinent  History Teresa [name] is a 81 y.o. 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 Teresa [name] completed CIR from 03/02/21-03/22/21. PMH includes: skin cancer, hypothyroidism, and Raynaud's disease, history of cervical fusion in 2010 C4-C7.  Teresa [name] reports HH Teresa [name]/OT/SLP was performed from January 2nd to February 28th with focus on ADL completion, ambulation. Teresa [name]'s big goal tranisitioning to ambulating up to 3 miles a day which Teresa [name] was doing prior to stroke. Teresa [name] has been ambulating in [employer] with husband but last day or two Teresa [name] has noticed increased L foot drag but states it is better today. Teresa [name] denies any falls at home or community. Teresa [name] reports balance deficits with turns primarily. States her balance is good with her quad cane. Wishing to return to full independence in ambulation with household and community ambulation. Wishing to be able to asc/desc full flight of stairs with ability to carry decorative items and other household items without need for handrail and/or AD.   ? Currently in Pain? No/denies   ? ?  ?  ? ?  ? ?Left ankle weight 5lb ?-318ft AMB with 4WW (TB anchor left hand on handle)  ?-bilat toe taps (5lb AW left) ?-retro walking (5lb AW left) ?-side stepping (5lb AW left) ?-balance beam walking  ?-hedgehog walking  ?-left toe taps eyes  closed ? ?-release work to Rt posterior proximal gluteals, bilat paraspinals, left quadratus (no pain returning to sitting form supine)  ? ? ? ? ? Teresa [name] Education - 07/13/21 0948   ? ? Education Details emphasized need to incorporate SPC at home for safety during fitness wlaking   ? Person(s) Educated Patient   ? Methods Explanation   ? Comprehension Verbalized understanding;Returned demonstration   ? ?  ?  ? ?  ? ? ? Teresa [name] Short Term Goals - 06/29/21 1115   ? ?  ? Teresa [name] SHORT TERM GOAL #1  ? Title Teresa [name] will be indep with HEP to improve balance, strength, and gait to optimize independence with ADL completion   ? Baseline 05/24/21: Initiated 4/7: to be advanced   ? Time 6   ? Period Weeks   ? Status  On-going   ? Target Date 07/05/21   ? ?  ?  ? ?  ? ? ? ? Teresa [name] Long Term Goals - 06/29/21 0932   ? ?  ? Teresa [name] LONG TERM GOAL #1  ? Title Teresa [name] will improve FOTO to target score to 67 demonstrate clinically significant improvement in functional mobility   ? Baseline 05/24/21: next session 4/7: 64   ? Time 12   ? Period Weeks   ? Status On-going   ? Target Date 08/02/21   ?  ? Teresa [name] LONG TERM GOAL #2  ? Title Teresa [name] will improve 5xSTS to 12 sec or less to indicate clinically significant improvement in LE strength   ? Baseline 05/24/21: 17.51 sec 4/7: 14.2 sec hands-free   ? Time 12   ? Period Weeks   ? Status Partially Met   ? Target Date 08/16/21   ?  ? Teresa [name] LONG TERM GOAL #3  ? Title Teresa [name] will improve 6 MWT by 165' to indicate clinically significant improvement in community ambulation distance for geriatric stroke norms.   ? Baseline 05/24/21: 765' 4/6: 842 ft with SPC   ? Time 12   ? Period Weeks   ? Status On-going   ? Target Date 08/16/21   ?  ? Teresa [name] LONG TERM GOAL #4  ? Title Teresa [name] will improve 10 m gait speed to at least 1.0 m/s to demonstrate reduced risk of falls with community ambulation tasks.   ? Baseline 05/24/21: .63 m/s without AD 4/6: 0.83 without an AD   ? Time 12   ? Period Weeks   ? Status On-going   ? Target Date 08/16/21   ?  ? Teresa [name] LONG TERM GOAL #5  ? Title Teresa [name] will improve FGA  by at least 5 point to demonstrate clinically signifcant reduced risk of falls with dynamic balance/ambulation tasks.   ? Baseline 05/24/21: 17; 4/7: 22   ? Time 12   ? Period Weeks   ? Status Achieved   ? Target Date 08/16/21   ? ?  ?  ? ?  ? ? ? ? ? ? ? ? Plan - 07/13/21 0950   ? ? Clinical Impression Statement Still working hard toward strenght and motor control training. Visual observation of LLE provides best feed back for left leg control in gait. Finished with manual therapies which provided a great deal of pain relief today and yesterday.   ? Personal Factors and Comorbidities Age;Time since onset of injury/illness/exacerbation;Comorbidity  3+;Past/Current Experience   ? Examination-Activity Limitations Squat;Stairs;Locomotion Level;Stand   ? Examination-Participation Restrictions Shop;Community Activity;[place] Work   ? Stability/Clinical Decision Making Evolving/Moderate complexity   ?  Clinical Decision Making Moderate   ? Rehab Potential Good   ? Teresa [name] Frequency 2x / week   ? Teresa [name] Duration 12 weeks   ? Teresa [name] Treatment/Interventions ADLs/Self Care Home Management;Canalith Repostioning;Electrical Stimulation;DME Instruction;Gait training;Stair training;Functional mobility training;Therapeutic activities;Patient/family education;Therapeutic exercise;Balance training;Neuromuscular re-education;Energy conservation;Vestibular;Passive range of motion   ? Teresa [name] Home Exercise Plan Seated L ankle DF; 3/10 Access Code: [email or web address]; no updates   ? Consulted and Agree with Plan of Care Patient   ? ?  ?  ? ?  ? ? ?Patient will benefit from skilled therapeutic intervention in order to improve the following deficits and impairments:  Abnormal gait, Pain, Decreased mobility, Decreased activity tolerance, Decreased endurance, Decreased strength, Decreased balance, Difficulty walking ? ?Visit Diagnosis: ?Muscle weakness (generalized) ? ?Chronic left shoulder pain ? ?Other lack of coordination ? ?Unsteadiness on feet ? ?Difficulty in walking, not elsewhere classified ? ?Abnormality of gait and mobility ? ? ? ? ?Problem List ?Patient Active Problem List  ? Diagnosis Date Noted  ? Right middle cerebral artery stroke ([place]) 03/01/2021  ? Stroke [facility]) 02/27/2021  ? History of nonmelanoma skin cancer 05/23/2014  ? ? ?Teresa [name], Teresa [name] ?07/13/2021, 10:06 AM ? ?[place] ?[facility] ?[place], [place], [address] ?Phone: [phone]   Fax:  [phone] ? ?Name: Teresa [name] ?MRN: [id number] ?Date of Birth: May 23, 1940 ? ? ? ?

## 2021-07-14 NOTE — Therapy (Signed)
[place] ?[place] [facility] ?[address], [place], [address] ?Phone: [phone]   Fax:  [phone] ? ?Occupational Therapy Treatment ? ?Patient Details  ?Name: [name] ?MRN: [id number] ?Date of Birth: 1940-10-25 ?No data recorded ? ?Encounter Date: 07/13/2021 ? ? OT End of Session - 07/14/21 1240   ? ? Visit Number 15   ? Number of Visits 24   ? Date for OT Re-Evaluation 08/18/21   ? OT Start Time 1015   ? OT Stop Time 1100   ? OT Time Calculation (min) 45 min   ? Activity Tolerance Patient tolerated treatment well   ? Behavior During Therapy [facility] for tasks assessed/performed   ? ?  ?  ? ?  ? ? ?Past Medical History:  ?Diagnosis Date  ? Cancer [facility])   ? skin  ? Hypothyroidism   ? Raynaud's disease   ? ? ?Past Surgical History:  ?Procedure Laterality Date  ? ABDOMINAL HYSTERECTOMY  1991  ? APPENDECTOMY  1991  ? BACK SURGERY  2010  ? Cervical fusion C4-5-6-7  ? CATARACT EXTRACTION, BILATERAL Bilateral 10/2016  ? CHOLECYSTECTOMY  1995  ? COLONOSCOPY WITH PROPOFOL N/A 12/08/2018  ? Procedure: COLONOSCOPY WITH PROPOFOL;  Surgeon: Toledo, Benay Pike, MD;  Location: [facility] ENDOSCOPY;  Service: Gastroenterology;  Laterality: N/A;  ? EYE SURGERY    ? ? ?There were no vitals filed for this visit. ? ? Subjective Assessment - 07/13/21 1238   ? ? Subjective  Pt reported she started walking outside again in her neighborhood a short distance.   ? Pertinent History February 26, 2021, pt reports she had a CVA, came to [facility] to ER and then was transferred to [facility] in [place] where she was admitted and after acute care she went to inpatient rehab.  Following inpt rehab, pt went home and had home health.   ? Patient Stated Goals Pt reports her goal is to get back to normal, be as independent as possible.   ? Currently in Pain? No/denies   ? Pain Score 0-No pain   ? Pain Onset More than a month ago   ? ?  ?  ? ?  ? ?Occupational Therapy Treatment: ?Manual  Therapy: ?Performed edema massage to L hand and wrist.  Pt acknowledges slightly more swelling at present d/t L hand dependently hanging during PT session.  Performed gentle, slow, prolonged stretch for L hand all digits for MP, PIP, and DIP flex/ext.  Performed manual L scapular mobility all planes.   ? ?Therapeutic Exercise: ?In supine, performed place and hold x5 with L shoulder flexed to 90, elbow extended, requiring intermittent min guard and tactile cues to maintain elbow extension, with OT providing assist for eccentric contraction lowering L arm back to mat.  Instructed pt in place and hold technique to carry over at home daily.  Performed AAROM for L shoulder flex, horiz abd, abd, ER/IR x10 each; cued pt for bilat movements to facilitate mirroring.  Performed L tricep extension against gravity, with OT stabilizing humerus in 90 degrees flexion in supine position x10 each.  Performed AROM for L wrist all planes, and digit flex/ext, UD/RD followed by passive stretch to achieve end range.  ? ?Response to Treatment: ?Pt was pleased to demonstrate increased L active shoulder flexion at start of session, nearing ~90 degrees.  Pt continues to develop increased proximal LUE strength.  Continued need for addressing L hand edema and ROM in order to better  perform grasp/release activities with L hand.  Pt verbalized that she has started short walks again in her neighborhood, but tolerating only short distances d/t LLE fatigue and dragging foot towards then end of her walk.  Pt will continue to benefit from skilled OT to increase LUE strength and coordination and manage edema and stiffness in L hand, working to increase engagement of LUE into daily tasks. ? ? OT Education - 07/13/21 1239   ? ? Education Details supine exercises, shoulder and hand exercises   ? Person(s) Educated Patient   ? Methods Explanation;Demonstration;Tactile cues;Verbal cues   ? Comprehension Verbalized understanding;Returned  demonstration;Verbal cues required;Need further instruction;Tactile cues required   ? ?  ?  ? ?  ? ? ? ? ? ? OT Long Term Goals - 06/28/21 0904   ? ?  ? OT LONG TERM GOAL #1  ? Title Pt will be independent with home exercise program.   ? Baseline Eval: no current program, 10th visit:  continue to add new exercises as pt progresses   ? Time 12   ? Period Weeks   ? Status On-going   ? Target Date 08/18/21   ?  ? OT LONG TERM GOAL #2  ? Title Pt will complete UB and LB dressing with modified independence including buttons, snaps and zippers.   ? Baseline requires min assist at eval, 10th visit: occasional assist with buttons   ? Time 12   ? Period Weeks   ? Status On-going   ? Target Date 08/18/21   ?  ? OT LONG TERM GOAL #3  ? Title Pt will perform shower transfer with modified independence.   ? Baseline Pt requires supervision to min assist for shower transfer at home. 10th visit: supervision   ? Time 6   ? Period Weeks   ? Status On-going   ? Target Date 07/07/21   ?  ? OT LONG TERM GOAL #4  ? Title Pt will improve L hand grip by 10# to assist with holding items in left hand securely.   ? Baseline no grip in left hand at eval, 10th visit:  improved flexion but still working towards composite fisting and grip   ? Time 12   ? Period Weeks   ? Status On-going   ? Target Date 08/18/21   ?  ? OT LONG TERM GOAL #5  ? Title Pt will improve left shoulder flexion by 10 degrees to improve reaching to obtain self care items from shelf/shoulder height.   ? Baseline difficulty with reach, shoulder flexion to 47 degrees.   ? Time 12   ? Period Weeks   ? Status On-going   ? Target Date 07/07/21   ?  ? OT LONG TERM GOAL #6  ? Title Pt will improve FOTO score to 47 or above to demonstrate a clinically relevant change in function to impact ADL tasks.   ? Baseline score of 30 at eval   ? Time 12   ? Period Weeks   ? Status On-going   ? Target Date 08/18/21   ?  ? OT LONG TERM GOAL #7  ? Title Pt will demonstrate ability to pick up  small objects and complete 9 hole peg test in less than 2 mins.   ? Baseline unable to perform at eval, 10th visit: still unable to pick up small pegs   ? Time 12   ? Period Weeks   ? Status On-going   ? Target Date  08/18/21   ? ?  ?  ? ?  ? ? ? Plan - 07/13/21 1300   ? ? Clinical Impression Statement Pt was pleased to demonstrate increased L active shoulder flexion at start of session, nearing ~90 degrees.  Pt continues to develop increased proximal LUE strength.  Continued need for addressing L hand edema and ROM in order to better perform grasp/release activities with L hand.  Pt verbalized that she has started short walks again in her neighborhood, but tolerating only short distances d/t LLE fatigue and dragging foot towards then end of her walk.  Pt will continue to benefit from skilled OT to increase LUE strength and coordination and manage edema and stiffness in L hand, working to increase engagement of LUE into daily tasks.   ? OT Occupational Profile and History Detailed Assessment- Review of Records and additional review of physical, cognitive, psychosocial history related to current functional performance   ? Occupational performance deficits (Please refer to evaluation for details): ADL's;IADL's;Leisure;Rest and Sleep   ? Body Structure / Function / Physical Skills ADL;Coordination;Endurance;GMC;UE functional use;Balance;IADL;Pain;Dexterity;FMC;Strength;Edema;Mobility;ROM   ? Psychosocial Skills Environmental  Adaptations;Habits;Routines and Behaviors   ? Rehab Potential Good   ? Clinical Decision Making Several treatment options, min-mod task modification necessary   ? Comorbidities Affecting Occupational Performance: May have comorbidities impacting occupational performance   ? Modification or Assistance to Complete Evaluation  Min-Moderate modification of tasks or assist with assess necessary to complete eval   ? OT Frequency 2x / week   ? OT Duration 12 weeks   ? OT Treatment/Interventions  Self-care/ADL training;Cryotherapy;Paraffin;Therapeutic exercise;DME and/or AE instruction;Functional Mobility Training;Balance training;Electrical Stimulation;Ultrasound;Neuromuscular education;Manual Therapy;Splinting;Moist Heat;Contrast Ba

## 2021-07-17 ENCOUNTER — Ambulatory Visit: Payer: Medicare PPO | Admitting: Speech Pathology

## 2021-07-17 ENCOUNTER — Ambulatory Visit
Admission: RE | Admit: 2021-07-17 | Discharge: 2021-07-17 | Disposition: A | Discharge status: Home / Self Care | Payer: Medicare PPO | Source: Ambulatory Visit | Attending: Internal Medicine | Admitting: Internal Medicine

## 2021-07-17 DIAGNOSIS — I69354 Hemiplegia and hemiparesis following cerebral infarction affecting left non-dominant side: Secondary | ICD-10-CM | POA: Insufficient documentation

## 2021-07-17 DIAGNOSIS — R471 Dysarthria and anarthria: Secondary | ICD-10-CM | POA: Diagnosis not present

## 2021-07-17 DIAGNOSIS — R1312 Dysphagia, oropharyngeal phase: Secondary | ICD-10-CM | POA: Insufficient documentation

## 2021-07-17 IMAGING — RF DG SWALLOWING FUNCTION
11 of 14 series · 15 of 24 positions shown · non-contrast
Comparison: None.

CLINICAL DATA: Hemiparesis affecting left side as late effect of
stroke

EXAM:
MODIFIED BARIUM SWALLOW
TECHNIQUE: Different consistencies of barium were administered orally to the
patient by the Speech Pathologist. Imaging of the pharynx was
performed in the lateral projection. The radiologist was present in
the fluoroscopy room for this study, providing personal supervision.
FLUOROSCOPY:
Radiation Exposure Index (as provided by the fluoroscopic device):
15.1 mGy Kerma

[Series 1: cp_standard · 0.08mm/px · 1 of 110 frames shown]
[frame 9/110]
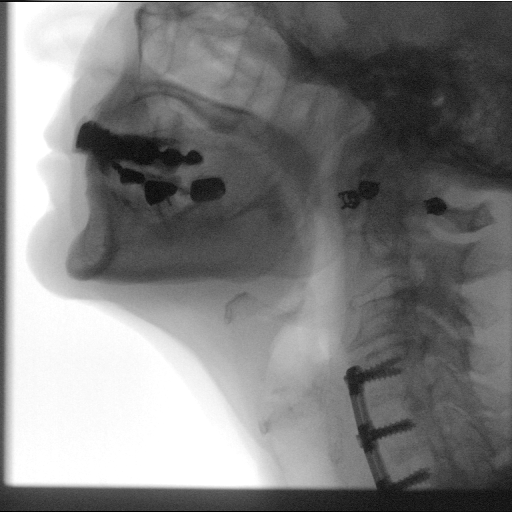

[Series 1: run · 1 of 24 frames shown (1 of 10)]
[frame 11/24]
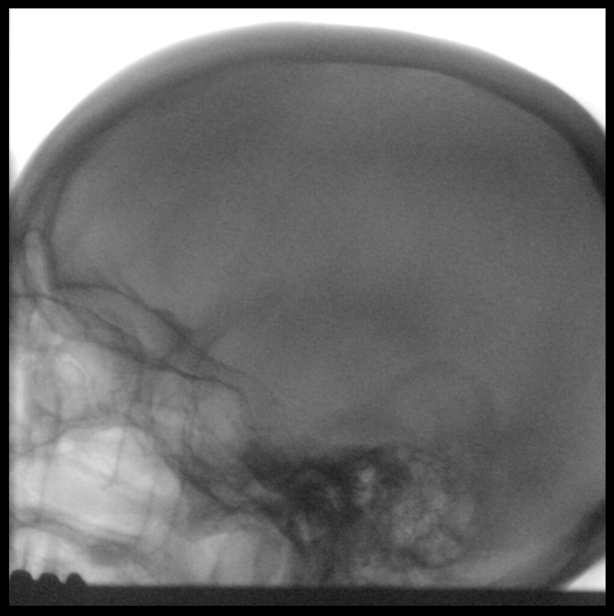

[Series 2: run · 1 of 103 frames shown (2 of 10)]
[frame 52/103]
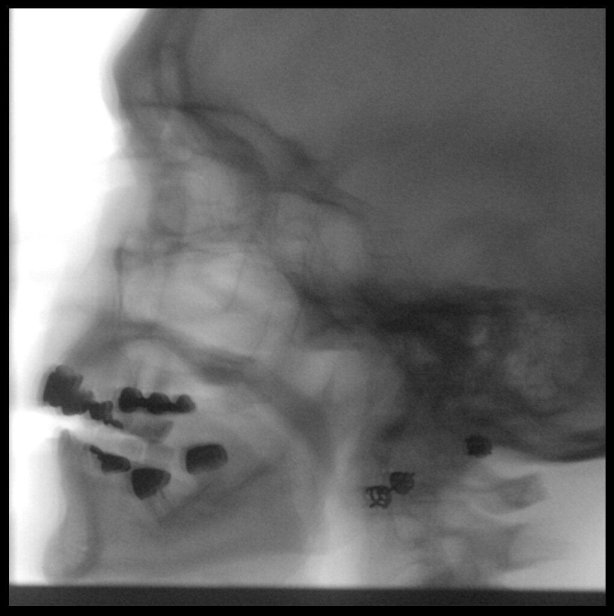

[Series 3: run · 1 of 31 frames shown (3 of 10)]
[frame 3/31]
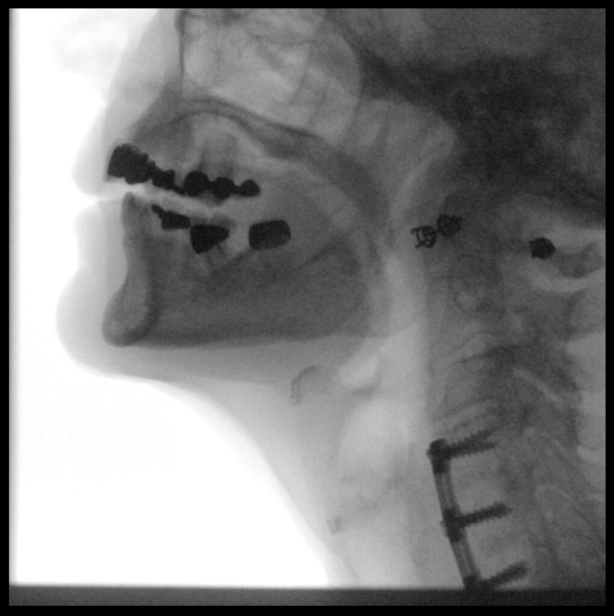

[Series 4: run · 2 of 121 frames shown (4 of 10)]
[frame 19/121]
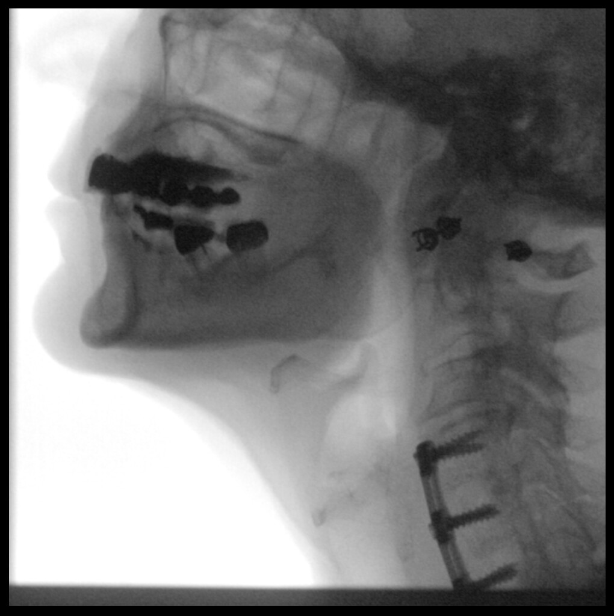
[frame 103/121]
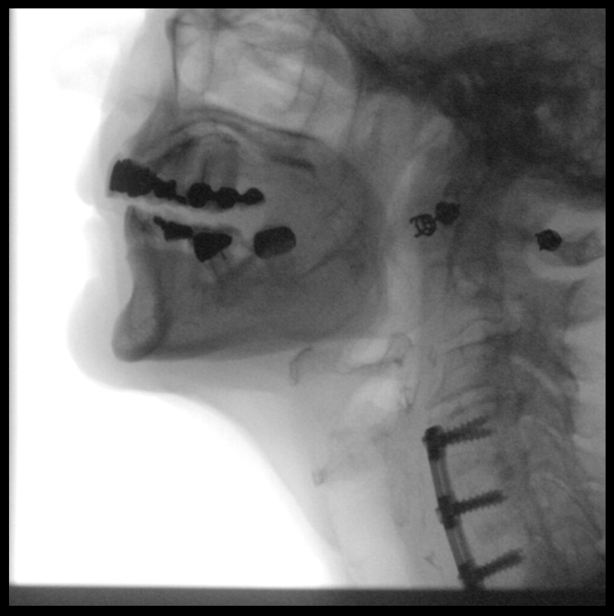

[Series 6: run · 1 of 414 frames shown (5 of 10)]
[frame 1/414]
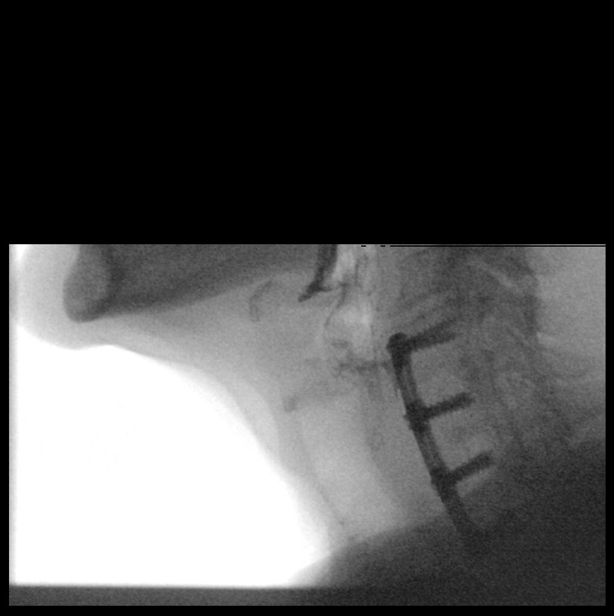

[Series 7: run · 2 of 245 frames shown (6 of 10)]
[frame 123/245]
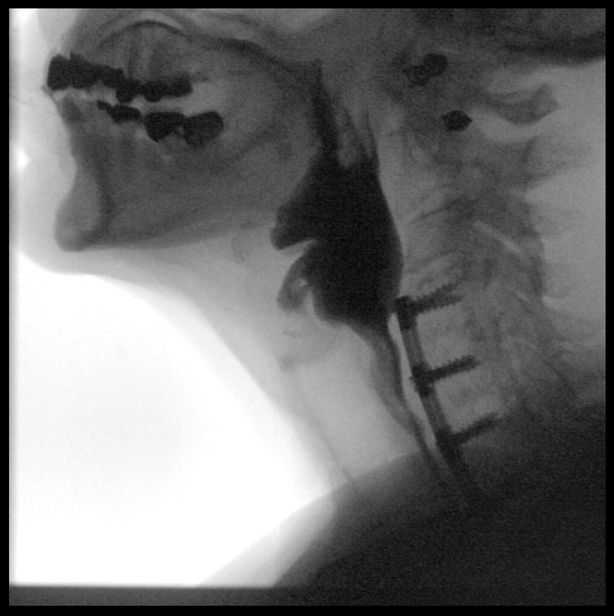
[frame 209/245]
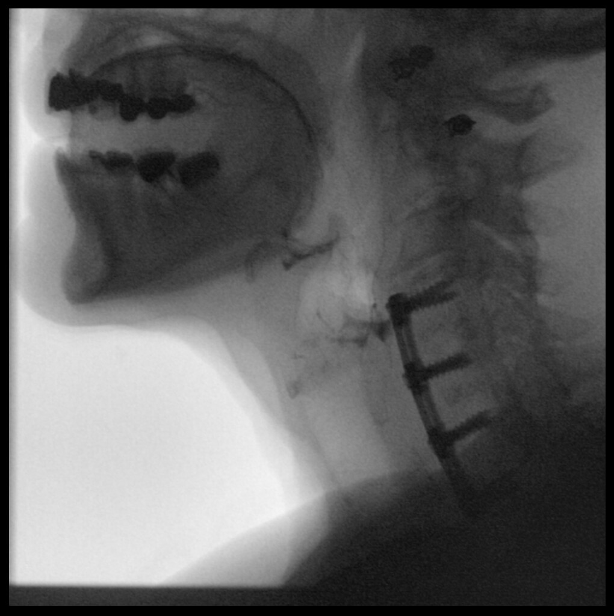

[Series 9: run · 2 of 116 frames shown (7 of 10)]
[frame 18/116]
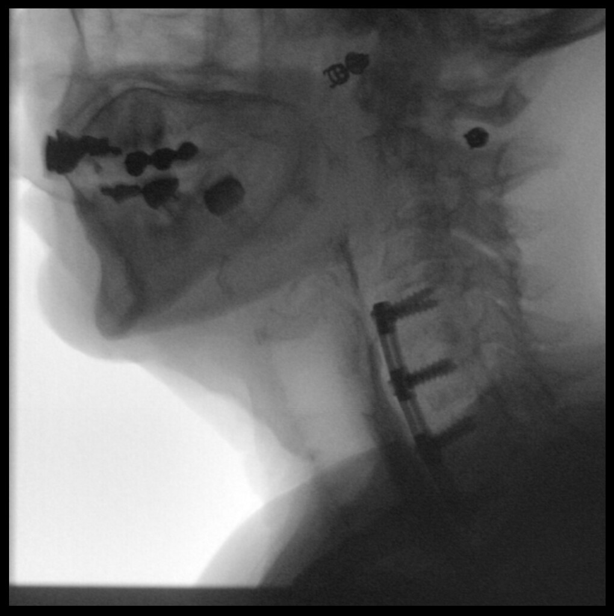
[frame 59/116]
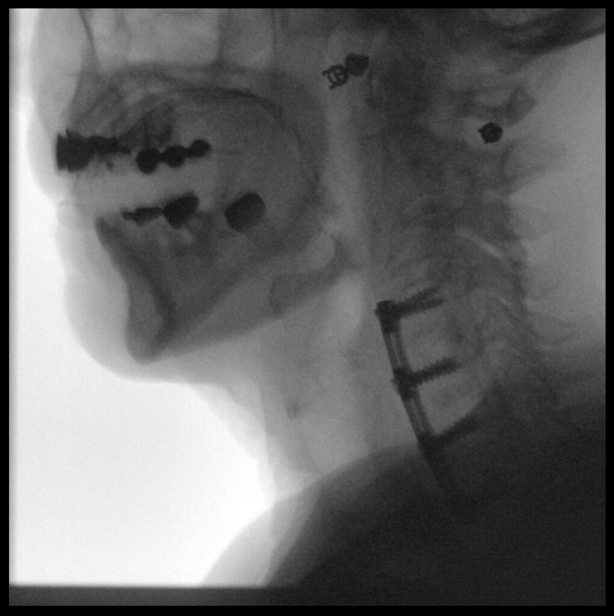

[Series 10: run · 1 of 149 frames shown (8 of 10)]
[frame 127/149]
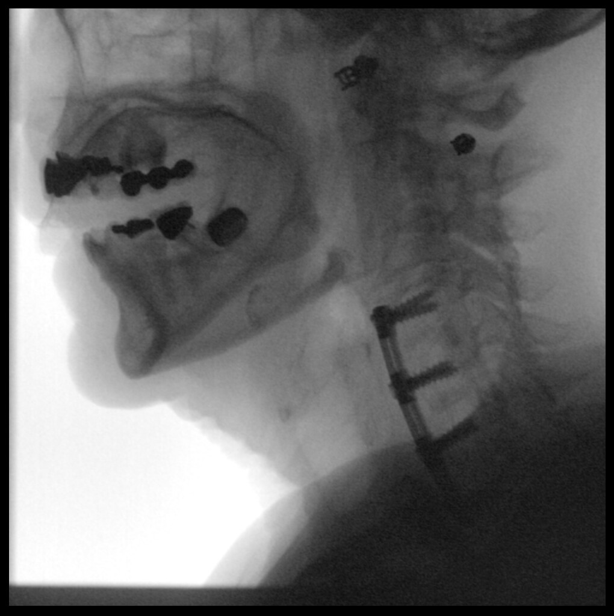

[Series 12: run · 2 of 245 frames shown (9 of 10)]
[frame 1/245]
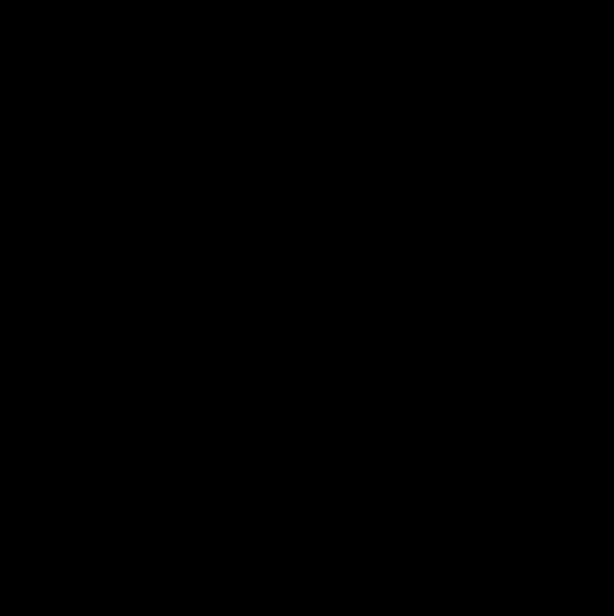
[frame 123/245]
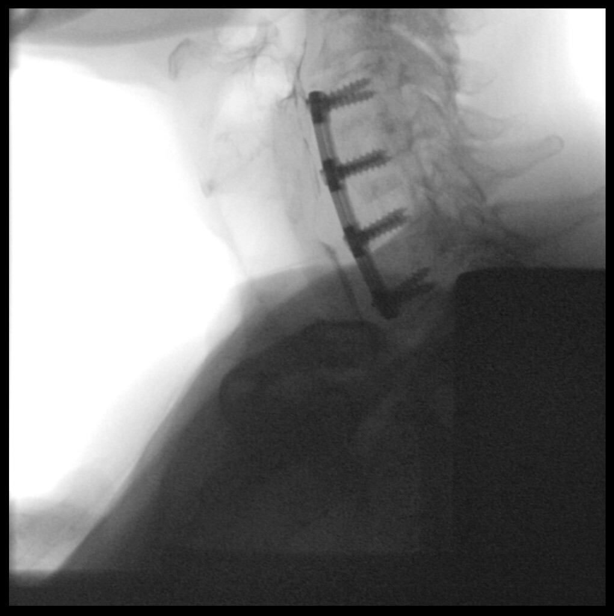

[Series 13: run · 1 of 160 frames shown (10 of 10)]
[frame 137/160]
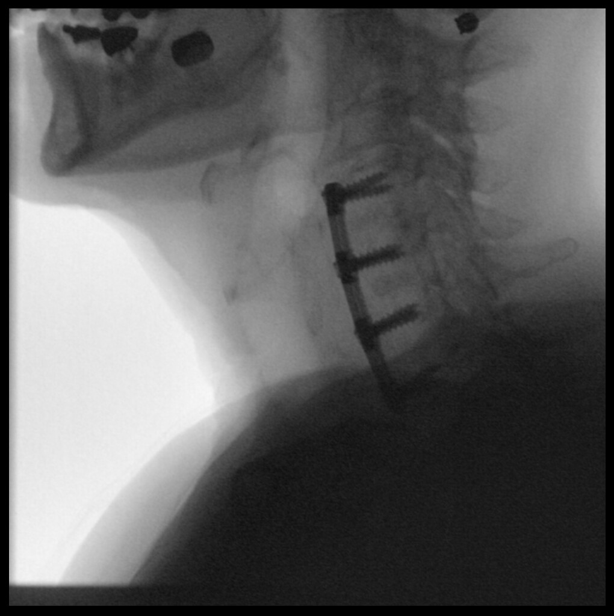

[15 of 24 positions shown; findings below may reference images not displayed]

FINDINGS: Vestibular Penetration:  With thin liquids.

Aspiration:  None seen.

Other: Barium pill transiently stuck within the valleculae and with
slow transit through the upper esophagus. In both cases, pill passed
with additional swallows.
IMPRESSION: No aspiration.

Please refer to the Speech Pathologists report for complete details
and recommendations.

## 2021-07-18 ENCOUNTER — Ambulatory Visit: Payer: Medicare PPO

## 2021-07-18 DIAGNOSIS — M6281 Muscle weakness (generalized): Secondary | ICD-10-CM

## 2021-07-18 DIAGNOSIS — R262 Difficulty in walking, not elsewhere classified: Secondary | ICD-10-CM

## 2021-07-18 DIAGNOSIS — R2681 Unsteadiness on feet: Secondary | ICD-10-CM

## 2021-07-18 DIAGNOSIS — R278 Other lack of coordination: Secondary | ICD-10-CM

## 2021-07-18 DIAGNOSIS — I63511 Cerebral infarction due to unspecified occlusion or stenosis of right middle cerebral artery: Secondary | ICD-10-CM

## 2021-07-18 NOTE — Progress Notes (Signed)
Modified Barium Swallow Progress Note ? ?Patient Details  ?Name: Teresa [name] [facility] ?MRN: [id number] ?Date of Birth: [birth year] ? ?Today's Date: 07/18/2021 ? ?Modified Barium Swallow completed.  Full report located under Chart Review in the Imaging Section. ? ?Brief recommendations include the following: ? ?Clinical Impression ? Although she continues to experience moderate left facial flaccidity, pt presents with much improved oropharyngeal abilities when compared to previous MBSS on 02/28/2021. During today's study, pt presents with oral phase that is c/b improved oral control of bolus resulting in bolus cohesion and no anterior spillage when consuming thin liquids via cup, straw and puree. Pt's pharyngeal phase is also improved in the timeliness of the swallow as well as strength. When consuming thin liquids via straw, pt has difficulty coordinating the straw as she also draws air into the straw. This resulted in 1 instance of silent aspiration. Cervical esophageal phase is noted for mildly reduced amplitude of pharyngoesophageal segment opening; there is min-mod retention in PE segment which may be baseline secondary to ACDF hardware.     ? ?SLP met with pt in Outpatient office to provide education on the results of this study as well as recommendation of drinking via cup only. While pt was delighted by results of study, she becomes emotional when discussing facial weakness, inability to smile as well as salvia escaping from her mouth. Continued skilled ST intervention appears appropriate to establish compensatory strategies targeting the above. ?  ?Swallow Evaluation Recommendations ? ?   ? ? SLP Diet Recommendations: Regular solids;Thin liquid ? ? Liquid Administration via: Cup ? ? Medication Administration: Whole meds with liquid ? ? Supervision: Patient able to self feed ? ? Compensations: Minimize environmental distractions;Slow rate;Small sips/bites ? ? Postural Changes: Seated upright at 90 degrees ? ? Oral  Care Recommendations: Oral care BID ? ?   ? ? ?Lavana Huckeba B. Rutherford Nail, M.S., CCC-SLP, CBIS ?Speech-Language Pathologist ?Rehabilitation Services ?Office [phone] ? ?Latishia Suitt ?07/18/2021,12:42 PM ?

## 2021-07-18 NOTE — Therapy (Signed)
[facility] ?[place], [place], [address] ?Phone: [phone]   Fax:  [phone] ? ?Physical Therapy Treatment ? ?Patient Details  ?Name: Teresa [name] [facility] ?MRN: [id number] ?Date of Birth: 1940/11/07 ?Referring Provider (PT): [name], MD ? ? ?Encounter Date: 07/18/2021 ? ? PT End of Session - 07/18/21 1656   ? ? Visit Number 15   ? Number of Visits 25   ? Date for PT Re-Evaluation 08/16/21   ? Authorization Type [employer] PPO-   ? Authorization Time Period Cert 08/30/59-9/50/93   ? Progress Note Due on Visit 20   ? PT Start Time 1104   ? PT Stop Time 1145   ? PT Time Calculation (min) 41 min   ? Equipment Utilized During Treatment Gait belt   ? Activity Tolerance Patient tolerated treatment well;No increased pain   ? Behavior During Therapy [facility] for tasks assessed/performed   ? ?  ?  ? ?  ? ? ?Past Medical History:  ?Diagnosis Date  ? Cancer [facility])   ? skin  ? Hypothyroidism   ? Raynaud's disease   ? ? ?Past Surgical History:  ?Procedure Laterality Date  ? ABDOMINAL HYSTERECTOMY  1991  ? APPENDECTOMY  1991  ? BACK SURGERY  2010  ? Cervical fusion C4-5-6-7  ? CATARACT EXTRACTION, BILATERAL Bilateral 10/2016  ? CHOLECYSTECTOMY  1995  ? COLONOSCOPY WITH PROPOFOL N/A 12/08/2018  ? Procedure: COLONOSCOPY WITH PROPOFOL;  Surgeon: Toledo, Benay Pike, MD;  Location: [facility] ENDOSCOPY;  Service: Gastroenterology;  Laterality: N/A;  ? EYE SURGERY    ? ? ?There were no vitals filed for this visit. ? ? Subjective Assessment - 07/18/21 1103   ? ? Subjective Pt reports she is feeling good today. She reports no falls. She reports some improvement in sciatic pain.   ? Pertinent History Pt is a 81 y.o. 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 Pt completed CIR from 03/02/21-03/22/21. PMH includes: skin cancer, hypothyroidism, and Raynaud's disease, history of cervical fusion in 2010 C4-C7.  Pt reports HH PT/OT/SLP was performed from January  2nd to February 28th with focus on ADL completion, ambulation. Pt's big goal tranisitioning to ambulating up to 3 miles a day which pt was doing prior to stroke. Pt has been ambulating in [employer] with husband but last day or two pt has noticed increased L foot drag but states it is better today. Pt denies any falls at home or community. Pt reports balance deficits with turns primarily. States her balance is good with her quad cane. Wishing to return to full independence in ambulation with household and community ambulation. Wishing to be able to asc/desc full flight of stairs with ability to carry decorative items and other household items without need for handrail and/or AD.   ? Limitations Lifting;Standing;Walking;House hold activities   ? How long can you sit comfortably? unlimited   ? How long can you stand comfortably? unlimited   ? How long can you walk comfortably? ~ 1.5 hours   ? Patient Stated Goals Return to independent walking for 3 miles. Return to to crochet.   ? Currently in Pain? No/denies   ? Pain Onset More than a month ago   ? ?  ?  ? ?  ? ? ?INTERVENTIONS:  ? ?NMR: ?In long hallway: ?Reading sticky notes to incorporate horziontal and vertical head turns. Pt reports intervention is challenging 2x through.  ? ?Slow walking  march down long hallway 2x through. Pt rates as challenging. Pt does exhibit mild unsteadiness. ? ?Obstacle course: airex pad, hurdle, and picking up and placing down cones 2x through. Pt rates easy, however, does exhibit compensations (circumduction around hurdle), no issue with balance picking up cones.  ? ?Half-bosu (flat side up) static standing 2x60 sec  ? -progressed to static standing with spelling out alphabet on board for dual cog and motor task x several minutes. UUE support with affected UE for sensory input.  ? ?TherEx: ?Cable machine: ?Resisted walking performed at 2.5#, 7.5#, 12.5# FWD/BCKWD focus on eccentric control x multiple reps.  ? ?Gait- ?Treadmill gait training  performed 0.2-0.4 mph at 1-3% elevation. Performed BLE and isolated LLE x several minutes of each. BUE support on bars. Cuing to promote correct sequencing: heel strike, step-width and length, toe-off of LLE. Pt improves following cues. Notable carryover once off of treadmill ambulating on firm surface.  ? ?Pt educated throughout session about proper posture and technique with exercises. Improved exercise technique, movement at target joints, use of target muscles after min to mod verbal, visual, tactile cues. ? ?  ? ? ? PT Education - 07/18/21 1655   ? ? Education Details exercise technique   ? Person(s) Educated Patient   ? Methods Explanation;Demonstration;Verbal cues   ? Comprehension Verbalized understanding;Returned demonstration;Need further instruction   ? ?  ?  ? ?  ? ? ? PT Short Term Goals - 06/29/21 1115   ? ?  ? PT SHORT TERM GOAL #1  ? Title Pt will be indep with HEP to improve balance, strength, and gait to optimize independence with ADL completion   ? Baseline 05/24/21: Initiated 4/7: to be advanced   ? Time 6   ? Period Weeks   ? Status On-going   ? Target Date 07/05/21   ? ?  ?  ? ?  ? ? ? ? PT Long Term Goals - 06/29/21 0932   ? ?  ? PT LONG TERM GOAL #1  ? Title Pt will improve FOTO to target score to 67 demonstrate clinically significant improvement in functional mobility   ? Baseline 05/24/21: next session 4/7: 64   ? Time 12   ? Period Weeks   ? Status On-going   ? Target Date 08/02/21   ?  ? PT LONG TERM GOAL #2  ? Title Pt will improve 5xSTS to 12 sec or less to indicate clinically significant improvement in LE strength   ? Baseline 05/24/21: 17.51 sec 4/7: 14.2 sec hands-free   ? Time 12   ? Period Weeks   ? Status Partially Met   ? Target Date 08/16/21   ?  ? PT LONG TERM GOAL #3  ? Title Pt will improve 6 MWT by 165' to indicate clinically significant improvement in community ambulation distance for geriatric stroke norms.   ? Baseline 05/24/21: 765' 4/6: 842 ft with SPC   ? Time 12   ? Period  Weeks   ? Status On-going   ? Target Date 08/16/21   ?  ? PT LONG TERM GOAL #4  ? Title Pt will improve 10 m gait speed to at least 1.0 m/s to demonstrate reduced risk of falls with community ambulation tasks.   ? Baseline 05/24/21: .63 m/s without AD 4/6: 0.83 without an AD   ? Time 12   ? Period Weeks   ? Status On-going   ? Target Date 08/16/21   ?  ?  PT LONG TERM GOAL #5  ? Title Pt will improve FGA  by at least 5 point to demonstrate clinically signifcant reduced risk of falls with dynamic balance/ambulation tasks.   ? Baseline 05/24/21: 17; 4/7: 22   ? Time 12   ? Period Weeks   ? Status Achieved   ? Target Date 08/16/21   ? ?  ?  ? ?  ? ? ? ? ? ? ? ? Plan - 07/18/21 1656   ? ? Clinical Impression Statement Pt with excellent motivation to participate in session. She tolerates interventions well without fatigue and without pain. Pt continues to advance to more challenging balance tasks, including bending to pick up cones, and exhibits no loss in postural stability. Part of session focused on gait training on treadmill at 1-3% elevation (pt reports she wants to get back to her walks, which involve hills). Pt exhibited within session carryover off of treadmill following cues to correct LLE sequencing. The pt will benefit from further skilled PT to continue to improve strength, gait, balance and mobility.   ? Personal Factors and Comorbidities Age;Time since onset of injury/illness/exacerbation;Comorbidity 3+;Past/Current Experience   ? Examination-Activity Limitations Squat;Stairs;Locomotion Level;Stand   ? Examination-Participation Restrictions Shop;Community Activity;[employer] Work   ? Stability/Clinical Decision Making Evolving/Moderate complexity   ? Rehab Potential Good   ? PT Frequency 2x / week   ? PT Duration 12 weeks   ? PT Treatment/Interventions ADLs/Self Care Home Management;Canalith Repostioning;Electrical Stimulation;DME Instruction;Gait training;Stair training;Functional mobility training;Therapeutic  activities;Patient/family education;Therapeutic exercise;Balance training;Neuromuscular re-education;Energy conservation;Vestibular;Passive range of motion   ? PT Next Visit Plan Progress LE strength, Gait t

## 2021-07-19 NOTE — Therapy (Signed)
[place] [facility] ?[place], [place], [address] ?Phone: [phone]   Fax:  [phone] ? ?Occupational Therapy Treatment ? ?Patient Details  ?Name: Teresa [name] ?MRN: [id number] ?Date of Birth: [birth year] ?No data recorded ? ?Encounter Date: 07/18/2021 ? ? OT End of Session - 07/19/21 0810   ? ? Visit Number 16   ? Number of Visits 24   ? Date for OT Re-Evaluation 08/18/21   ? OT Start Time 1145   ? OT Stop Time 1230   ? OT Time Calculation (min) 45 min   ? Activity Tolerance Patient tolerated treatment well   ? Behavior During Therapy [name] for tasks assessed/performed   ? ?  ?  ? ?  ? ? ?Past Medical History:  ?Diagnosis Date  ? Cancer [name])   ? skin  ? Hypothyroidism   ? Raynaud's disease   ? ? ?Past Surgical History:  ?Procedure Laterality Date  ? ABDOMINAL HYSTERECTOMY  1991  ? APPENDECTOMY  1991  ? BACK SURGERY  2010  ? Cervical fusion C4-5-6-7  ? CATARACT EXTRACTION, BILATERAL Bilateral 10/2016  ? CHOLECYSTECTOMY  1995  ? COLONOSCOPY WITH PROPOFOL N/A 12/08/2018  ? Procedure: COLONOSCOPY WITH PROPOFOL;  Surgeon: Toledo, Benay Pike, MD;  Location: [facility] ENDOSCOPY;  Service: Gastroenterology;  Laterality: N/A;  ? EYE SURGERY    ? ? ?There were no vitals filed for this visit. ? ? Subjective Assessment - 07/18/21 0808   ? ? Subjective  Pt reporting continued improvements with lifting LUE.   ? Pertinent History February 26, 2021, pt reports she had a CVA, came to [facility] to ER and then was transferred to [facility] in [place] where she was admitted and after acute care she went to inpatient rehab.  Following inpt rehab, pt went home and had home health.   ? Patient Stated Goals Pt reports her goal is to get back to normal, be as independent as possible.   ? Currently in Pain? No/denies   ? Pain Score 0-No pain   ? Pain Onset More than a month ago   ? ?  ?  ? ?  ? ?Occupational Therapy Treatment: ?Therapeutic Exercise: ?Performed supine passive  L shoulder stretch for flex/abd/horiz add/ER, forearm pron/sup, wrist flex/ext, and digit flex/ext in prep for neuro re-ed activities for LUE and increasing flexibility for self care tasks. ? ?Neuro re-ed: ?Worked grasp/release and reaching in all planes for Saebo ring using LUE (supine position).  Pt required intermittent cues for increasing finger flexion for better grasp of ring, and performed intermittent passive digit flex/ext for better grasp.  Cues for extending L elbow for increasing reach with L shoulder flex and abd.  Practiced co-contractions in LUE with sustaining L elbow flexion (supine position) while performing active pron/sup.  Pt with limited range for supination, but tolerates passive stretch well.  Practiced reciprocal arm swing with mobility walking from clinic to registration desk upstairs, pt requiring intermittent min vc to maintain swing.   ? ?Response to Treatment: ?See Plan/clinical impression below. ? ? OT Education - 07/18/21 0809   ? ? Education Details supine exercises, shoulder and hand exercises   ? Person(s) Educated Patient   ? Methods Explanation;Demonstration;Tactile cues;Verbal cues   ? Comprehension Verbalized understanding;Returned demonstration;Verbal cues required;Need further instruction;Tactile cues required   ? ?  ?  ? ?  ? ? ? ? ? ? OT Long Term Goals - 06/28/21 0904   ? ?  ?  OT LONG TERM GOAL #1  ? Title Pt will be independent with home exercise program.   ? Baseline Eval: no current program, 10th visit:  continue to add new exercises as pt progresses   ? Time 12   ? Period Weeks   ? Status On-going   ? Target Date 08/18/21   ?  ? OT LONG TERM GOAL #2  ? Title Pt will complete UB and LB dressing with modified independence including buttons, snaps and zippers.   ? Baseline requires min assist at eval, 10th visit: occasional assist with buttons   ? Time 12   ? Period Weeks   ? Status On-going   ? Target Date 08/18/21   ?  ? OT LONG TERM GOAL #3  ? Title Pt will perform  shower transfer with modified independence.   ? Baseline Pt requires supervision to min assist for shower transfer at home. 10th visit: supervision   ? Time 6   ? Period Weeks   ? Status On-going   ? Target Date 07/07/21   ?  ? OT LONG TERM GOAL #4  ? Title Pt will improve L hand grip by 10# to assist with holding items in left hand securely.   ? Baseline no grip in left hand at eval, 10th visit:  improved flexion but still working towards composite fisting and grip   ? Time 12   ? Period Weeks   ? Status On-going   ? Target Date 08/18/21   ?  ? OT LONG TERM GOAL #5  ? Title Pt will improve left shoulder flexion by 10 degrees to improve reaching to obtain self care items from shelf/shoulder height.   ? Baseline difficulty with reach, shoulder flexion to 47 degrees.   ? Time 12   ? Period Weeks   ? Status On-going   ? Target Date 07/07/21   ?  ? OT LONG TERM GOAL #6  ? Title Pt will improve FOTO score to 47 or above to demonstrate a clinically relevant change in function to impact ADL tasks.   ? Baseline score of 30 at eval   ? Time 12   ? Period Weeks   ? Status On-going   ? Target Date 08/18/21   ?  ? OT LONG TERM GOAL #7  ? Title Pt will demonstrate ability to pick up small objects and complete 9 hole peg test in less than 2 mins.   ? Baseline unable to perform at eval, 10th visit: still unable to pick up small pegs   ? Time 12   ? Period Weeks   ? Status On-going   ? Target Date 08/18/21   ? ?  ?  ? ?  ? ? ? Plan - 07/18/21 0830   ? ? Clinical Impression Statement Good tolerance to supine reaching exercises with LUE.  Increased grasp/release following passive digit flex/ext stretch for L hand digits.  Pt denied L shoulder pain today and is continuing to make gains with AROM throughout LUE.  Pt will continue to benefit from skilled OT to increase LUE strength, flexibility, and coordination and manage edema and stiffness in L hand, working to increase engagement of LUE into daily tasks.   ? OT Occupational Profile  and History Detailed Assessment- Review of Records and additional review of physical, cognitive, psychosocial history related to current functional performance   ? Occupational performance deficits (Please refer to evaluation for details): ADL's;IADL's;Leisure;Rest and Sleep   ? Body Structure /  Function / Physical Skills ADL;Coordination;Endurance;GMC;UE functional use;Balance;IADL;Pain;Dexterity;FMC;Strength;Edema;Mobility;ROM   ? Psychosocial Skills Environmental  Adaptations;Habits;Routines and Behaviors   ? Rehab Potential Good   ? Clinical Decision Making Several treatment options, min-mod task modification necessary   ? Comorbidities Affecting Occupational Performance: May have comorbidities impacting occupational performance   ? Modification or Assistance to Complete Evaluation  Min-Moderate modification of tasks or assist with assess necessary to complete eval   ? OT Frequency 2x / week   ? OT Duration 12 weeks   ? OT Treatment/Interventions Self-care/ADL training;Cryotherapy;Paraffin;Therapeutic exercise;DME and/or AE instruction;Functional Mobility Training;Balance training;Electrical Stimulation;Ultrasound;Neuromuscular education;Manual Therapy;Splinting;Moist Heat;Contrast Bath;Passive range of motion;Therapeutic activities;Patient/family education;Coping strategies training   ? Consulted and Agree with Plan of Care Patient   ? ?  ?  ? ?  ? ? ?Patient will benefit from skilled therapeutic intervention in order to improve the following deficits and impairments:   ?Body Structure / Function / Physical Skills: ADL, Coordination, Endurance, GMC, UE functional use, Balance, IADL, Pain, Dexterity, FMC, Strength, Edema, Mobility, ROM ?  ?Psychosocial Skills: Environmental  Adaptations, Habits, Routines and Behaviors ? ? ?Visit Diagnosis: ?Muscle weakness (generalized) ? ?Other lack of coordination ? ?Right middle cerebral artery stroke [facility]) ? ? ? ?Problem List ?Patient Active Problem List  ? Diagnosis Date  Noted  ? Right middle cerebral artery stroke ([place]) 03/01/2021  ? Stroke [name]) 02/27/2021  ? History of nonmelanoma skin cancer 05/23/2014  ? ?Teresa Speller, MS, OTR/L ? ?Darleene Cleaver, OT ?07/19/2021,

## 2021-07-20 ENCOUNTER — Ambulatory Visit: Payer: Medicare PPO | Admitting: Speech Pathology

## 2021-07-20 ENCOUNTER — Encounter: Payer: Self-pay | Admitting: Occupational Therapy

## 2021-07-20 ENCOUNTER — Ambulatory Visit: Payer: Medicare PPO | Admitting: Occupational Therapy

## 2021-07-20 ENCOUNTER — Ambulatory Visit: Payer: Medicare PPO

## 2021-07-20 DIAGNOSIS — R2681 Unsteadiness on feet: Secondary | ICD-10-CM

## 2021-07-20 DIAGNOSIS — R262 Difficulty in walking, not elsewhere classified: Secondary | ICD-10-CM

## 2021-07-20 DIAGNOSIS — R471 Dysarthria and anarthria: Secondary | ICD-10-CM

## 2021-07-20 DIAGNOSIS — G8929 Other chronic pain: Secondary | ICD-10-CM

## 2021-07-20 DIAGNOSIS — I63511 Cerebral infarction due to unspecified occlusion or stenosis of right middle cerebral artery: Secondary | ICD-10-CM

## 2021-07-20 DIAGNOSIS — R278 Other lack of coordination: Secondary | ICD-10-CM

## 2021-07-20 DIAGNOSIS — M25551 Pain in right hip: Secondary | ICD-10-CM

## 2021-07-20 DIAGNOSIS — M6281 Muscle weakness (generalized): Secondary | ICD-10-CM

## 2021-07-20 NOTE — Therapy (Signed)
[place] ?[facility] ?[place], [place], [address] ?Phone: [phone]   Fax:  [phone] ? ?Speech Language Pathology Treatment ? ?Patient Details  ?Name: Teresa [name] ?MRN: [id number] ?Date of Birth: 10-05-1940 ?Referring Provider (SLP): [name] ? ? ?Encounter Date: 07/20/2021 ? ? End of Session - 07/20/21 2147   ? ? Visit Number 4   ? Number of Visits 25   ? Date for SLP Re-Evaluation 09/28/21   ? Authorization Type [employer] Medicare Choice PPO   ? Authorization Time Period 07/05/2021 thru 09/28/2021   ? Authorization - Visit Number 4   ? Progress Note Due on Visit 10   ? SLP Start Time 1100   ? SLP Stop Time  1200   ? SLP Time Calculation (min) 60 min   ? Activity Tolerance Patient tolerated treatment well   ? ?  ?  ? ?  ? ? ?Past Medical History:  ?Diagnosis Date  ? Cancer [facility])   ? skin  ? Hypothyroidism   ? Raynaud's disease   ? ? ?Past Surgical History:  ?Procedure Laterality Date  ? ABDOMINAL HYSTERECTOMY  1991  ? APPENDECTOMY  1991  ? BACK SURGERY  2010  ? Cervical fusion C4-5-6-7  ? CATARACT EXTRACTION, BILATERAL Bilateral 10/2016  ? CHOLECYSTECTOMY  1995  ? COLONOSCOPY WITH PROPOFOL N/A 12/08/2018  ? Procedure: COLONOSCOPY WITH PROPOFOL;  Surgeon: Toledo, Benay Pike, MD;  Location: [facility] ENDOSCOPY;  Service: Gastroenterology;  Laterality: N/A;  ? EYE SURGERY    ? ? ?There were no vitals filed for this visit. ? ? Subjective Assessment - 07/20/21 2140   ? ? Subjective pt pleasant, wiping her mouth to prevent salvia from escaping mouth   ? Currently in Pain? No/denies   ? ?  ?  ? ?  ? ? ? ? ? ? ? ? ADULT SLP TREATMENT - 07/20/21 0001   ? ?  ? Treatment Provided  ? Treatment provided Cognitive-Linquistic   ?  ? Cognitive-Linquistic Treatment  ? Treatment focused on Dysarthria;Patient/family/caregiver education   ? Skilled Treatment Skilled treatment session focused on pt's oral motor impairments. SLP facilitated session by providing moderate  assistance to perform exagerated smile, side pucker, alternating side pucker, facial stimulation with smile.  ? ?Pt asked to complete the Eating Assessment Tool (EAT-10). The EAT-10 is a symptom-specific outcome instrument for dysphagia. It consists of ten statements that a patient rates on a scale of 0-4, with 0=no problem to 4=severe problem. A score of 3 or more is abnormal. Subjective dysphagia symptoms can predict aspiration risk: a score of 15 or more indicates the patient is 2.2 times more likely to aspirate Teresa [name] et al, 2015).  The mean (+/- SD) EAT-10 score of the normal cohort was 0.40 +/- 1.01.       ? ?1. My swallowing problem has caused me to lose weight.   0 ?  2. My swallowing problem interferes with my ability to go out for meals.  3  ?3. Swallowing liquids takes extra effort.   4 ?4. Swallowing solids takes extra effort.   4 ?5. Swallowing pills takes extra effort.   3 ?6. Swallowing is painful.   2 ?7. The pleasure of eating is affected by my swallowing.   3 ?8. When I swallow food sticks in my throat.   3 ?9. I cough when I eat.   2 ?10. Swallowing is stressful.  3 ? ?Total EAT-10: 27   ? ?  ?  ? ?  ? ? ?  SLP Education - 07/20/21 2146   ? ? Education Details oral motor exercises   ? Person(s) Educated Patient   ? Methods Explanation;Demonstration;Verbal cues;Handout   ? Comprehension Verbalized understanding;Verbal cues required;Need further instruction   ? ?  ?  ? ?  ? ? ? SLP Short Term Goals - 07/06/21 0750   ? ?  ? SLP SHORT TERM GOAL #1  ? Title Pt will utilize speech intelligibility strategies to achieve > 95% speech intelligibility at the sentence level.   ? Baseline < 90%   ? Time 10   ? Period --   sessions  ? Status New   ?  ? SLP SHORT TERM GOAL #2  ? Title Pt will perform pharyngeal strengthening exercises with rare Min A.   ? Baseline new goal   ? Time 10   ? Period --   sessions  ? Status New   ?  ? SLP SHORT TERM GOAL #3  ? Title Pt will participate in instrumental swallow study.    ? Baseline new goal   ? Time 10   ? Period --   sessions  ? Status New   ? ?  ?  ? ?  ? ? ? SLP Long Term Goals - 07/06/21 0752   ? ?  ? SLP LONG TERM GOAL #1  ? Title Pt will be Mod I for use of speech intelligibility strategies to achieve > 95% speech intelligibility across multiple settings and communication partners.   ? Baseline 85%   ? Time 12   ? Period Weeks   ? Status New   ? Target Date 09/28/21   ? ?  ?  ? ?  ? ? ? Plan - 07/20/21 2147   ? ? Clinical Impression Statement Recommend short course of skilled ST intervention to target oral motor weakness and to improve pt's concerns with dysphagia.   ? Speech Therapy Frequency 2x / week   ? Duration 12 weeks   ? Treatment/Interventions Aspiration precaution training;Pharyngeal strengthening exercises;Oral motor exercises;Compensatory strategies;Patient/family education;SLP instruction and feedback;Diet toleration management by SLP;Trials of upgraded texture/liquids   ? Potential to Achieve Goals Good   ? SLP Home Exercise Plan provided, see pt instructions section   ? Consulted and Agree with Plan of Care Patient   ? ?  ?  ? ?  ? ? ?Patient will benefit from skilled therapeutic intervention in order to improve the following deficits and impairments:   ?Dysarthria and anarthria ? ?Right middle cerebral artery stroke [facility]) ? ? ? ?Problem List ?Patient Active Problem List  ? Diagnosis Date Noted  ? Right middle cerebral artery stroke ([place]) 03/01/2021  ? Stroke [facility]) 02/27/2021  ? History of nonmelanoma skin cancer 05/23/2014  ? ?Alvy Alsop B. Rutherford Nail, M.S., CCC-SLP, CBIS ?Speech-Language Pathologist ?Rehabilitation Services ?Office [phone] ? ?[place], [place] ?07/20/2021, 9:49 PM ? ?[place] ?[facility] ?[place], [place], [address] ?Phone: [phone]   Fax:  [phone] ? ? ?Name: Teresa [name] [facility] ?MRN: [id number] ?Date of Birth: 10-19-1940 ? ?

## 2021-07-20 NOTE — Therapy (Signed)
[place] ?[facility] [facility] ?[place], [place], [address] ?Phone: [phone]   Fax:  [phone] ? ?Physical Therapy Treatment ? ?Patient Details  ?Name: Teresa [name] [facility] ?MRN: [id number] ?Date of Birth: 09-11-1940 ?Referring Provider (PT): [name], MD ? ? ?Encounter Date: 07/20/2021 ? ? PT End of Session - 07/20/21 0926   ? ? Visit Number 16   ? Number of Visits 25   ? Date for PT Re-Evaluation 08/16/21   ? Authorization Type [employer] Medicare PPO-   ? Authorization Time Period Cert 8/0/16-5/53/74   ? Progress Note Due on Visit 20   ? PT Start Time [phone]   ? PT Stop Time 1015   ? PT Time Calculation (min) 44 min   ? Equipment Utilized During Treatment Gait belt   ? Activity Tolerance Patient tolerated treatment well   ? Behavior During Therapy [facility] for tasks assessed/performed   ? ?  ?  ? ?  ? ? ?Past Medical History:  ?Diagnosis Date  ? Cancer [facility])   ? skin  ? Hypothyroidism   ? Raynaud's disease   ? ? ?Past Surgical History:  ?Procedure Laterality Date  ? ABDOMINAL HYSTERECTOMY  1991  ? APPENDECTOMY  1991  ? BACK SURGERY  2010  ? Cervical fusion C4-5-6-7  ? CATARACT EXTRACTION, BILATERAL Bilateral 10/2016  ? CHOLECYSTECTOMY  1995  ? COLONOSCOPY WITH PROPOFOL N/A 12/08/2018  ? Procedure: COLONOSCOPY WITH PROPOFOL;  Surgeon: Toledo, Benay Pike, MD;  Location: [facility] END[facility];  Service: Gastroenterology;  Laterality: N/A;  ? EYE SURGERY    ? ? ?There were no vitals filed for this visit. ? ? Subjective Assessment - 07/20/21 0929   ? ? Subjective Pt reports no falls. Pt having some R hip pain today. She thinks this is due to compensating with RLE due to L side weakness.   ? Pertinent History Pt is a 81 y.o. 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 Pt completed CIR from 03/02/21-03/22/21. PMH includes: skin cancer, hypothyroidism, and Raynaud's disease, history of cervical fusion in 2010 C4-C7.  Pt reports HH PT/OT/SLP was performed from  January 2nd to February 28th with focus on ADL completion, ambulation. Pt's big goal tranisitioning to ambulating up to 3 miles a day which pt was doing prior to stroke. Pt has been ambulating in [employer] with husband but last day or two pt has noticed increased L foot drag but states it is better today. Pt denies any falls at home or community. Pt reports balance deficits with turns primarily. States her balance is good with her quad cane. Wishing to return to full independence in ambulation with household and community ambulation. Wishing to be able to asc/desc full flight of stairs with ability to carry decorative items and other household items without need for handrail and/or AD.   ? Limitations Lifting;Standing;Walking;House hold activities   ? How long can you sit comfortably? unlimited   ? How long can you stand comfortably? unlimited   ? How long can you walk comfortably? ~ 1.5 hours   ? Patient Stated Goals Return to independent walking for 3 miles. Return to to crochet.   ? Currently in Pain? Yes   ? Pain Location Hip   ? Pain Orientation Right   ? Pain Onset 1 to 4 weeks ago   ? ?  ?  ? ?  ? ? ?INTERVENTIONS:  ?  ? ?TherEx:  ?Seated: ?hamstring stretch 2x30  sec B ?Piriformis stretch 30 sec B   ?STS with physioball held between [place] for adduction strengthening 10x  ?Standing abduction 12x BLEs, 8x BLEs. LUE on rail to promote sensory feedback. ?Heel raises 15x BLEs. Performed through full ROM. Continues use of LUE on support surface to promote sensory feedback through LUE ? ?NMR: ? ?Cone taps with BUE support x multiple reps over 4 min each LE. Discontinued once pt reported increase in L hip pain. ? ? ?Manual: ?On plinth ?Pt with pain with adduction/IR of R hip (PT brings pt through this ROM). Pt without reports of pain when brought through R hip ER. Some pain with flexion beyond 90 deg. This improved following the below interventions-  ? ?LTRs 20x B within pain-free range. Full available ROM to R side,  however, limited when R side on stretch to L. ? ?Long duration stretching provided by PT: ?RLE hamstring stretch, pt able to gradually increase ROM tolerance with stretch with time. Pt noted with slight decrease in hamstring flexibility. ?B piriformis stretch ?B IT-band stretch ?R knee to chest stretch- had to decrease ROM due to pain with initial reps. This improved following therex listed below. ? ?Therex: ?On mat table: ?Glute bridge 1x10, 1x15 ?Pball hamstring curls 2x10 ?SLR through full ROM 10x B. Pt reports no pain or pulling sensation ? ? ?Pt educated throughout session about proper posture and technique with exercises. Improved exercise technique, movement at target joints, use of target muscles after min to mod verbal, visual, tactile cues. ? ?  ?Pt educated throughout session about proper posture and technique with exercises. Improved exercise technique, movement at target joints, use of target muscles after min to mod verbal, visual, tactile cues. ? ? ? ? PT Education - 07/20/21 1034   ? ? Education Details exercise technique, body mechanics   ? Person(s) Educated Patient   ? Methods Explanation;Demonstration;Tactile cues;Verbal cues   ? Comprehension Verbalized understanding;Returned demonstration;Need further instruction   ? ?  ?  ? ?  ? ? ? PT Short Term Goals - 06/29/21 1115   ? ?  ? PT SHORT TERM GOAL #1  ? Title Pt will be indep with HEP to improve balance, strength, and gait to optimize independence with ADL completion   ? Baseline 05/24/21: Initiated 4/7: to be advanced   ? Time 6   ? Period Weeks   ? Status On-going   ? Target Date 07/05/21   ? ?  ?  ? ?  ? ? ? ? PT Long Term Goals - 06/29/21 0932   ? ?  ? PT LONG TERM GOAL #1  ? Title Pt will improve FOTO to target score to 67 demonstrate clinically significant improvement in functional mobility   ? Baseline 05/24/21: next session 4/7: 64   ? Time 12   ? Period Weeks   ? Status On-going   ? Target Date 08/02/21   ?  ? PT LONG TERM GOAL #2  ? Title  Pt will improve 5xSTS to 12 sec or less to indicate clinically significant improvement in LE strength   ? Baseline 05/24/21: 17.51 sec 4/7: 14.2 sec hands-free   ? Time 12   ? Period Weeks   ? Status Partially Met   ? Target Date 08/16/21   ?  ? PT LONG TERM GOAL #3  ? Title Pt will improve 6 MWT by 165' to indicate clinically significant improvement in community ambulation distance for geriatric stroke norms.   ? Baseline 05/24/21:  765' 4/6: 842 ft with SPC   ? Time 12   ? Period Weeks   ? Status On-going   ? Target Date 08/16/21   ?  ? PT LONG TERM GOAL #4  ? Title Pt will improve 10 m gait speed to at least 1.0 m/s to demonstrate reduced risk of falls with community ambulation tasks.   ? Baseline 05/24/21: .63 m/s without AD 4/6: 0.83 without an AD   ? Time 12   ? Period Weeks   ? Status On-going   ? Target Date 08/16/21   ?  ? PT LONG TERM GOAL #5  ? Title Pt will improve FGA  by at least 5 point to demonstrate clinically signifcant reduced risk of falls with dynamic balance/ambulation tasks.   ? Baseline 05/24/21: 17; 4/7: 22   ? Time 12   ? Period Weeks   ? Status Achieved   ? Target Date 08/16/21   ? ?  ?  ? ?  ? ? ? ? ? ? ? ? Plan - 07/20/21 1036   ? ? Clinical Impression Statement Session somewhat limited to supine therex due to increased R hip pain on this date. Pt noted to have pain primarily with hip adduction and IR. Pt with initial pain with full ROM R hip flexion, but this resolved within session following interventions. Will continue to monitor. The pt will benefit from further skilled PT to improve strength, balance, gait and mobility to increase QOL and decrease fall risk.   ? Personal Factors and Comorbidities Age;Time since onset of injury/illness/exacerbation;Comorbidity 3+;Past/Current Experience   ? Examination-Activity Limitations Squat;Stairs;Locomotion Level;Stand   ? Examination-Participation Restrictions Shop;Community Activity;[place] Work   ? Stability/Clinical Decision Making Evolving/Moderate  complexity   ? Rehab Potential Good   ? PT Frequency 2x / week   ? PT Duration 12 weeks   ? PT Treatment/Interventions ADLs/Self Care Home Management;Canalith Repostioning;Electrical Stimulation;DME Instruction;G

## 2021-07-20 NOTE — Patient Instructions (Signed)
Handout provided detailing oral motor exercises, complete per directions ?

## 2021-07-22 NOTE — Therapy (Signed)
[place] [facility] ?[place], [place], [address] ?Phone: [phone]   Fax:  [phone] ? ?Occupational Therapy Treatment ? ?Patient Details  ?Name: Teresa [name] ?MRN: [id number] ?Date of Birth: [birth year] ?No data recorded ? ?Encounter Date: 07/20/2021 ? ? OT End of Session - 07/22/21 1607   ? ? Visit Number 17   ? Number of Visits 24   ? Date for OT Re-Evaluation 08/18/21   ? OT Start Time 1015   ? OT Stop Time 1100   ? OT Time Calculation (min) 45 min   ? Activity Tolerance Patient tolerated treatment well   ? Behavior During Therapy [facility] for tasks assessed/performed   ? ?  ?  ? ?  ? ? ?Past Medical History:  ?Diagnosis Date  ? Cancer [facility])   ? skin  ? Hypothyroidism   ? Raynaud's disease   ? ? ?Past Surgical History:  ?Procedure Laterality Date  ? ABDOMINAL HYSTERECTOMY  1991  ? APPENDECTOMY  1991  ? BACK SURGERY  2010  ? Cervical fusion C4-5-6-7  ? CATARACT EXTRACTION, BILATERAL Bilateral 10/2016  ? CHOLECYSTECTOMY  1995  ? COLONOSCOPY WITH PROPOFOL N/A 12/08/2018  ? Procedure: COLONOSCOPY WITH PROPOFOL;  Surgeon: Toledo, Benay Pike, MD;  Location: [facility] ENDOSCOPY;  Service: Gastroenterology;  Laterality: N/A;  ? EYE SURGERY    ? ? ?There were no vitals filed for this visit. ? ? Subjective Assessment - 07/22/21 1607   ? ? Subjective  Pt reporting continued improvements with lifting LUE.   ? Pertinent History February 26, 2021, pt reports she had a CVA, came to [facility] to ER and then was transferred to [facility] in [place] where she was admitted and after acute care she went to inpatient rehab.  Following inpt rehab, pt went home and had home health.   ? Patient Stated Goals Pt reports her goal is to get back to normal, be as independent as possible.   ? Pain Onset More than a month ago   ? ?  ?  ? ?  ? ? ?Patient reports she is having a good week. She went on a walk with her friend that other day, but feels like she may have overdone it.  She has not returned to additional walking this week in her neighborhood. Her plan is to slowly get back to walking and potentially walking to additional house each time she goes out. ?Patient reports some pain in her left shoulder around the bicep area, dull ache most of the time but occasional shooting pain with select movements.  ? ?Therapeutic Exercises:  ?Patient seen for shoulder stabilization exercises in supine with shoulder flexion 90? with place and hold for 3 to 5 seconds. Elbow extension with shoulder flexion at 90? bringing into her forehead and pushing back up. Patient performing reaching across body to opposite shoulder with therapist guiding and assist. Elbow, flexion and extension, external rotation, with prolong stretch from therapist. Passive range of motion performed by therapist to left digits and hand towards composite flexion/fisting followed by AAROM for finger flexion towards composite fisting. ? ?Manual Therapy: ?Mobilization to left scapula for elevation, retraction and upwards rotation prior to ROM exercises.  ?Daisy Blossom tool applied use to  upper arm and biceps with sweeping motion to you restore muscle fibers and address tissue restrictions.   ?Manual massage and soft tissue techniques to left shoulder and bicep area to increase ROM.   ? ?Response to tx: ?  Patient continuing to make good progress towards goals. She reports pain in left area with select patterns involving shoulder and arm. She continues to demonstrate mild edema in the left digits and bruised, but has benefited from compression with the use of an Isotoner gloves for edema. She is independent with donning/doffing glove.  Pt limited by pain at times in left UE around bicep area when performing range of motion or attempts with active use in tasks. She responded well to therapeutic techniques this date and continues to benefit from skilled OT services to improve functional use of left UE.    ? ? ? ? ? ? ? ? ? ? ? ? ? ? ? ? ? ? ? ? ? OT Education - 07/22/21 1607   ? ? Education Details supine exercises, shoulder and hand exercises   ? Person(s) Educated Patient   ? Methods Explanation;Demonstration;Tactile cues;Verbal cues   ? Comprehension Verbalized understanding;Returned demonstration;Verbal cues required;Need further instruction;Tactile cues required   ? ?  ?  ? ?  ? ? ? ? ? ? OT Long Term Goals - 06/28/21 0904   ? ?  ? OT LONG TERM GOAL #1  ? Title Pt will be independent with home exercise program.   ? Baseline Eval: no current program, 10th visit:  continue to add new exercises as pt progresses   ? Time 12   ? Period Weeks   ? Status On-going   ? Target Date 08/18/21   ?  ? OT LONG TERM GOAL #2  ? Title Pt will complete UB and LB dressing with modified independence including buttons, snaps and zippers.   ? Baseline requires min assist at eval, 10th visit: occasional assist with buttons   ? Time 12   ? Period Weeks   ? Status On-going   ? Target Date 08/18/21   ?  ? OT LONG TERM GOAL #3  ? Title Pt will perform shower transfer with modified independence.   ? Baseline Pt requires supervision to min assist for shower transfer at home. 10th visit: supervision   ? Time 6   ? Period Weeks   ? Status On-going   ? Target Date 07/07/21   ?  ? OT LONG TERM GOAL #4  ? Title Pt will improve L hand grip by 10# to assist with holding items in left hand securely.   ? Baseline no grip in left hand at eval, 10th visit:  improved flexion but still working towards composite fisting and grip   ? Time 12   ? Period Weeks   ? Status On-going   ? Target Date 08/18/21   ?  ? OT LONG TERM GOAL #5  ? Title Pt will improve left shoulder flexion by 10 degrees to improve reaching to obtain self care items from shelf/shoulder height.   ? Baseline difficulty with reach, shoulder flexion to 47 degrees.   ? Time 12   ? Period Weeks   ? Status On-going   ? Target Date 07/07/21   ?  ? OT LONG TERM GOAL #6  ? Title Pt will improve  FOTO score to 47 or above to demonstrate a clinically relevant change in function to impact ADL tasks.   ? Baseline score of 30 at eval   ? Time 12   ? Period Weeks   ? Status On-going   ? Target Date 08/18/21   ?  ? OT LONG TERM GOAL #7  ? Title Pt will demonstrate ability  to pick up small objects and complete 9 hole peg test in less than 2 mins.   ? Baseline unable to perform at eval, 10th visit: still unable to pick up small pegs   ? Time 12   ? Period Weeks   ? Status On-going   ? Target Date 08/18/21   ? ?  ?  ? ?  ? ? ? ? ? ? ? ? Plan - 07/22/21 1608   ? ? Clinical Impression Statement Patient continuing to make good progress towards goals. She reports pain in left area with select patterns involving shoulder and arm. She continues to demonstrate mild edema in the left digits and bruised, but has benefited from compression with the use of an Isotoner gloves for edema. She is independent with donning/doffing glove.  Pt limited by pain at times in left UE around bicep area when performing range of motion or attempts with active use in tasks. She responded well to therapeutic techniques this date and continues to benefit from skilled OT services to improve functional use of left UE.   ? OT Occupational Profile and History Detailed Assessment- Review of Records and additional review of physical, cognitive, psychosocial history related to current functional performance   ? Occupational performance deficits (Please refer to evaluation for details): ADL's;IADL's;Leisure;Rest and Sleep   ? Body Structure / Function / Physical Skills ADL;Coordination;Endurance;GMC;UE functional use;Balance;IADL;Pain;Dexterity;FMC;Strength;Edema;Mobility;ROM   ? Psychosocial Skills Environmental  Adaptations;Habits;Routines and Behaviors   ? Rehab Potential Good   ? Clinical Decision Making Several treatment options, min-mod task modification necessary   ? Comorbidities Affecting Occupational Performance: May have comorbidities  impacting occupational performance   ? Modification or Assistance to Complete Evaluation  Min-Moderate modification of tasks or assist with assess necessary to complete eval   ? OT Frequency 2x / week   ? OT Duration 12 weeks   ? OT Treatment/Inter

## 2021-07-23 ENCOUNTER — Ambulatory Visit: Payer: Medicare PPO

## 2021-07-24 ENCOUNTER — Ambulatory Visit: Payer: Medicare PPO | Attending: Internal Medicine

## 2021-07-24 ENCOUNTER — Ambulatory Visit: Payer: Medicare PPO | Admitting: Occupational Therapy

## 2021-07-24 ENCOUNTER — Ambulatory Visit: Payer: Medicare PPO | Admitting: Speech Pathology

## 2021-07-24 DIAGNOSIS — M25512 Pain in left shoulder: Secondary | ICD-10-CM | POA: Diagnosis present

## 2021-07-24 DIAGNOSIS — G8929 Other chronic pain: Secondary | ICD-10-CM | POA: Diagnosis present

## 2021-07-24 DIAGNOSIS — R471 Dysarthria and anarthria: Secondary | ICD-10-CM | POA: Insufficient documentation

## 2021-07-24 DIAGNOSIS — R278 Other lack of coordination: Secondary | ICD-10-CM

## 2021-07-24 DIAGNOSIS — R2681 Unsteadiness on feet: Secondary | ICD-10-CM

## 2021-07-24 DIAGNOSIS — I63511 Cerebral infarction due to unspecified occlusion or stenosis of right middle cerebral artery: Secondary | ICD-10-CM | POA: Diagnosis present

## 2021-07-24 DIAGNOSIS — R1312 Dysphagia, oropharyngeal phase: Secondary | ICD-10-CM | POA: Insufficient documentation

## 2021-07-24 DIAGNOSIS — Z1231 Encounter for screening mammogram for malignant neoplasm of breast: Secondary | ICD-10-CM | POA: Diagnosis present

## 2021-07-24 DIAGNOSIS — M6281 Muscle weakness (generalized): Secondary | ICD-10-CM | POA: Diagnosis present

## 2021-07-24 DIAGNOSIS — R1311 Dysphagia, oral phase: Secondary | ICD-10-CM | POA: Diagnosis present

## 2021-07-24 DIAGNOSIS — R262 Difficulty in walking, not elsewhere classified: Secondary | ICD-10-CM | POA: Insufficient documentation

## 2021-07-24 DIAGNOSIS — M25551 Pain in right hip: Secondary | ICD-10-CM | POA: Insufficient documentation

## 2021-07-24 DIAGNOSIS — R482 Apraxia: Secondary | ICD-10-CM | POA: Diagnosis present

## 2021-07-24 NOTE — Therapy (Signed)
[place] [facility] ?[place], [place], [address] ?Phone: [phone]   Fax:  [phone] ? ?Physical Therapy Treatment ? ?Patient Details  ?Name: Teresa [name] ?MRN: [id number] ?Date of Birth: 08-07-1940 ?Referring Provider (PT): [name], MD ? ? ?Encounter Date: 07/24/2021 ? ? PT End of Session - 07/24/21 1710   ? ? Visit Number 17   ? Number of Visits 25   ? Date for PT Re-Evaluation 08/16/21   ? Authorization Type [employer] Medicare PPO-   ? Authorization Time Period Cert 08/29/32-1/93/79   ? Progress Note Due on Visit 20   ? PT Start Time [phone]   ? PT Stop Time 1014   ? PT Time Calculation (min) 40 min   ? Equipment Utilized During Treatment Gait belt   ? Activity Tolerance Patient tolerated treatment well   ? Behavior During Therapy [facility] for tasks assessed/performed   ? ?  ?  ? ?  ? ? ?Past Medical History:  ?Diagnosis Date  ? Cancer [facility])   ? skin  ? Hypothyroidism   ? Raynaud's disease   ? ? ?Past Surgical History:  ?Procedure Laterality Date  ? ABDOMINAL HYSTERECTOMY  1991  ? APPENDECTOMY  1991  ? BACK SURGERY  2010  ? Cervical fusion C4-5-6-7  ? CATARACT EXTRACTION, BILATERAL Bilateral 10/2016  ? CHOLECYSTECTOMY  1995  ? COLONOSCOPY WITH PROPOFOL N/A 12/08/2018  ? Procedure: COLONOSCOPY WITH PROPOFOL;  Surgeon: Toledo, Benay Pike, MD;  Location: [facility] ENDOSCOPY;  Service: Gastroenterology;  Laterality: N/A;  ? EYE SURGERY    ? ? ?There were no vitals filed for this visit. ? ? Subjective Assessment - 07/24/21 0935   ? ? Subjective Pt reports minimal R hip pain today. Pt reports no falls. No other changes reported.   ? Pertinent History Pt is a 81 y.o. 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 Pt completed CIR from 03/02/21-03/22/21. PMH includes: skin cancer, hypothyroidism, and Raynaud's disease, history of cervical fusion in 2010 C4-C7.  Pt reports HH PT/OT/SLP was performed from January 2nd to February 28th with focus on ADL  completion, ambulation. Pt's big goal tranisitioning to ambulating up to 3 miles a day which pt was doing prior to stroke. Pt has been ambulating in [employer] with husband but last day or two pt has noticed increased L foot drag but states it is better today. Pt denies any falls at home or community. Pt reports balance deficits with turns primarily. States her balance is good with her quad cane. Wishing to return to full independence in ambulation with household and community ambulation. Wishing to be able to asc/desc full flight of stairs with ability to carry decorative items and other household items without need for handrail and/or AD.   ? Limitations Lifting;Standing;Walking;House hold activities   ? How long can you sit comfortably? unlimited   ? How long can you stand comfortably? unlimited   ? How long can you walk comfortably? ~ 1.5 hours   ? Patient Stated Goals Return to independent walking for 3 miles. Return to to crochet.   ? Currently in Pain? Yes   ? Pain Location Hip   ? Pain Orientation Right   ? Pain Onset More than a month ago   ? ?  ?  ? ?  ? ? ? ?INTERVENTIONS:  ? ?NMR: Gait belt donned and CGA provided unless otherwise noted ? ?Ambulating in long hall (no AD)  reading sticky notes to incorporate vertical and horizontal head turns 3x length of hall. Pt reports sensation of unsteadiness. ? ?Cone tapes forward stepping and crossing over stepping x multiple reps each LE ?Airex high knee marches x multiple reps alt. LE ?Airex pad EC WBOS 2x30 sec ?Airex pad EC NBOS 2x30 sec ? ?Slow high knee marching gait 4x10 meters without AD ? ?Ambulation with horizontal head turns 2x over 10 meters ? ?5 cones placed on floor for obstacle course: pt instructed to weave through cones, and complete full turns 10x through ? ?Semi-tandem gait with continuously narrowing BOS over 10 meters. Challenging ? ?Pt educated throughout session about proper posture and technique with exercises. Improved exercise technique, movement  at target joints, use of target muscles after min to mod verbal, visual, tactile cues. ? ? ? PT Education - 07/24/21 1710   ? ? Education Details exercise technique   ? Person(s) Educated Patient   ? Methods Explanation;Demonstration;Tactile cues;Verbal cues   ? Comprehension Verbalized understanding;Returned demonstration;Need further instruction   ? ?  ?  ? ?  ? ? ? PT Short Term Goals - 06/29/21 1115   ? ?  ? PT SHORT TERM GOAL #1  ? Title Pt will be indep with HEP to improve balance, strength, and gait to optimize independence with ADL completion   ? Baseline 05/24/21: Initiated 4/7: to be advanced   ? Time 6   ? Period Weeks   ? Status On-going   ? Target Date 07/05/21   ? ?  ?  ? ?  ? ? ? ? PT Long Term Goals - 06/29/21 0932   ? ?  ? PT LONG TERM GOAL #1  ? Title Pt will improve FOTO to target score to 67 demonstrate clinically significant improvement in functional mobility   ? Baseline 05/24/21: next session 4/7: 64   ? Time 12   ? Period Weeks   ? Status On-going   ? Target Date 08/02/21   ?  ? PT LONG TERM GOAL #2  ? Title Pt will improve 5xSTS to 12 sec or less to indicate clinically significant improvement in LE strength   ? Baseline 05/24/21: 17.51 sec 4/7: 14.2 sec hands-free   ? Time 12   ? Period Weeks   ? Status Partially Met   ? Target Date 08/16/21   ?  ? PT LONG TERM GOAL #3  ? Title Pt will improve 6 MWT by 165' to indicate clinically significant improvement in community ambulation distance for geriatric stroke norms.   ? Baseline 05/24/21: 765' 4/6: 842 ft with SPC   ? Time 12   ? Period Weeks   ? Status On-going   ? Target Date 08/16/21   ?  ? PT LONG TERM GOAL #4  ? Title Pt will improve 10 m gait speed to at least 1.0 m/s to demonstrate reduced risk of falls with community ambulation tasks.   ? Baseline 05/24/21: .63 m/s without AD 4/6: 0.83 without an AD   ? Time 12   ? Period Weeks   ? Status On-going   ? Target Date 08/16/21   ?  ? PT LONG TERM GOAL #5  ? Title Pt will improve FGA  by at least 5  point to demonstrate clinically signifcant reduced risk of falls with dynamic balance/ambulation tasks.   ? Baseline 05/24/21: 17; 4/7: 22   ? Time 12   ? Period Weeks   ? Status Achieved   ?  Target Date 08/16/21   ? ?  ?  ? ?  ? ? ? ? ? ? ? ? Plan - 07/24/21 1711   ? ? Clinical Impression Statement Pt with excellent motivation to particpate in session. She tolerates interventions well without pain. Pt continues to be able to perform majority of balance interventions without her AD and with no more than CGA. While pt progressing, she still has difficulty with semi-tandem gait and using head turns while ambulating. The pt will benefit from further PT to address these deficits to decrease fall risk.   ? Personal Factors and Comorbidities Age;Time since onset of injury/illness/exacerbation;Comorbidity 3+;Past/Current Experience   ? Examination-Activity Limitations Squat;Stairs;Locomotion Level;Stand   ? Examination-Participation Restrictions Shop;Community Activity;[employer] Work   ? Stability/Clinical Decision Making Evolving/Moderate complexity   ? Rehab Potential Good   ? PT Frequency 2x / week   ? PT Duration 12 weeks   ? PT Treatment/Interventions ADLs/Self Care Home Management;Canalith Repostioning;Electrical Stimulation;DME Instruction;Gait training;Stair training;Functional mobility training;Therapeutic activities;Patient/family education;Therapeutic exercise;Balance training;Neuromuscular re-education;Energy conservation;Vestibular;Passive range of motion   ? PT Next Visit Plan Progress LE strength, Gait training - heel to toe sequencing, progress balance as appropriate. Continue plan   ? PT Home Exercise Plan Seated L ankle DF; 3/10 Access Code: [email or web address]; no updates   ? Consulted and Agree with Plan of Care Patient   ? ?  ?  ? ?  ? ? ?Patient will benefit from skilled therapeutic intervention in order to improve the following deficits and impairments:  Abnormal gait, Pain, Decreased mobility, Decreased activity  tolerance, Decreased endurance, Decreased strength, Decreased balance, Difficulty walking ? ?Visit Diagnosis: ?Unsteadiness on feet ? ?Difficulty in walking, not elsewhere classified ? ?Other lack of coordi

## 2021-07-24 NOTE — Therapy (Signed)
[place] [facility] ?[place], [place], [address] ?Phone: [phone]   Fax:  [phone] ? ?Speech Language Pathology Treatment ? ?Patient Details  ?Name: Teresa [name] ?MRN: [id number] ?Date of Birth: Mar 28, 1940 ?Referring Provider (SLP): [name] ? ? ?Encounter Date: 07/24/2021 ? ? End of Session - 07/24/21 1319   ? ? Visit Number 5   ? Number of Visits 25   ? Date for SLP Re-Evaluation 09/28/21   ? Authorization Type [employer] Medicare Choice PPO   ? Authorization Time Period 07/05/2021 thru 09/28/2021   ? Authorization - Visit Number 5   ? Progress Note Due on Visit 10   ? SLP Start Time 1015   ? SLP Stop Time  1100   ? SLP Time Calculation (min) 45 min   ? Activity Tolerance Patient tolerated treatment well   ? ?  ?  ? ?  ? ? ?Past Medical History:  ?Diagnosis Date  ? Cancer [name])   ? skin  ? Hypothyroidism   ? Raynaud's disease   ? ? ?Past Surgical History:  ?Procedure Laterality Date  ? ABDOMINAL HYSTERECTOMY  1991  ? APPENDECTOMY  1991  ? BACK SURGERY  2010  ? Cervical fusion C4-5-6-7  ? CATARACT EXTRACTION, BILATERAL Bilateral 10/2016  ? CHOLECYSTECTOMY  1995  ? COLONOSCOPY WITH PROPOFOL N/A 12/08/2018  ? Procedure: COLONOSCOPY WITH PROPOFOL;  Surgeon: Toledo, Benay Pike, MD;  Location: [facility] ENDOSCOPY;  Service: Gastroenterology;  Laterality: N/A;  ? EYE SURGERY    ? ? ?There were no vitals filed for this visit. ? ? Subjective Assessment - 07/24/21 1310   ? ? Subjective "I keep forgetting how to do these," re exercises   ? Currently in Pain? No/denies   ? ?  ?  ? ?  ? ? ? ? ? ? ? ? ADULT SLP TREATMENT - 07/24/21 1311   ? ?  ? General Information  ? Behavior/Cognition Alert;Cooperative;Pleasant mood   ?  ? Treatment Provided  ? Treatment provided Dysphagia   ?  ? Dysphagia Treatment  ? Temperature Spikes Noted No   ? Treatment Methods Therapeutic exercise   ? Amount of cueing Moderate   ? Other treatment/comments Continued training in HEP for  oral motor weakness by reviewing exercises, providing feedback on technique and education on rationale for frequency/intensity of exercise. Pt reports completing 5 reps of each exercise. Reports she frequently forgets how to complete. Demonstrated using her access code to view video demonstrations on [place]; pt reports familiar with this process from PT exercises and will try this at home. Pt completed reps of assigned tasks (3 sets of 10) in front a mirror with mod cues for holding for appropriate duration and for appropriate technique. Able to fade support to occasional min A with pt counting reps on her fingers and SLP counting hold time aloud.   ?  ? Progression Toward Goals  ? Progression toward goals Progressing toward goals   ? ?  ?  ? ?  ? ? ? SLP Education - 07/24/21 1318   ? ? Education Details Use mirror for feedback   ? Person(s) Educated Patient   ? Methods Explanation;Demonstration   ? Comprehension Verbalized understanding   ? ?  ?  ? ?  ? ? ? SLP Short Term Goals - 07/06/21 0750   ? ?  ? SLP SHORT TERM GOAL #1  ? Title Pt will utilize speech intelligibility strategies to achieve >  95% speech intelligibility at the sentence level.   ? Baseline < 90%   ? Time 10   ? Period --   sessions  ? Status New   ?  ? SLP SHORT TERM GOAL #2  ? Title Pt will perform pharyngeal strengthening exercises with rare Min A.   ? Baseline new goal   ? Time 10   ? Period --   sessions  ? Status New   ?  ? SLP SHORT TERM GOAL #3  ? Title Pt will participate in instrumental swallow study.   ? Baseline new goal   ? Time 10   ? Period --   sessions  ? Status New   ? ?  ?  ? ?  ? ? ? SLP Long Term Goals - 07/06/21 0752   ? ?  ? SLP LONG TERM GOAL #1  ? Title Pt will be Mod I for use of speech intelligibility strategies to achieve > 95% speech intelligibility across multiple settings and communication partners.   ? Baseline 85%   ? Time 12   ? Period Weeks   ? Status New   ? Target Date 09/28/21   ? ?  ?  ? ?  ? ? ? Plan -  07/24/21 1319   ? ? Clinical Impression Statement Pt continues to report drooling. Required overall min-mod cues with HEP today. Continue short course of skilled ST intervention to target oral motor weakness and to improve pt's concerns with dysphagia.   ? Speech Therapy Frequency 2x / week   ? Duration 12 weeks   ? Treatment/Interventions Aspiration precaution training;Pharyngeal strengthening exercises;Oral motor exercises;Compensatory strategies;Patient/family education;SLP instruction and feedback;Diet toleration management by SLP;Trials of upgraded texture/liquids   ? Potential to Achieve Goals Good   ? SLP Home Exercise Plan provided, see pt instructions section   ? Consulted and Agree with Plan of Care Patient   ? ?  ?  ? ?  ? ? ?Patient will benefit from skilled therapeutic intervention in order to improve the following deficits and impairments:   ?Dysphagia, oral phase ? ?Right middle cerebral artery stroke [name]) ? ? ? ?Problem List ?Patient Active Problem List  ? Diagnosis Date Noted  ? Right middle cerebral artery stroke ([place]) 03/01/2021  ? Stroke [facility]) 02/27/2021  ? History of nonmelanoma skin cancer 05/23/2014  ? ?Deneise Lever, MS, CCC-SLP ?Speech-Language Pathologist ?[phone] ? ?Aliene Altes, CCC-SLP ?07/24/2021, 1:20 PM ? ? ?[facility] ?[place], [place], [address] ?Phone: [phone]   Fax:  [phone] ? ? ?Name: Teresa [name] ?MRN: [id number] ?Date of Birth: Jul 27, 1940 ? ?

## 2021-07-27 ENCOUNTER — Ambulatory Visit: Payer: Medicare PPO | Admitting: Occupational Therapy

## 2021-07-27 ENCOUNTER — Ambulatory Visit: Payer: Medicare PPO

## 2021-07-27 ENCOUNTER — Ambulatory Visit: Payer: Medicare PPO | Admitting: Speech Pathology

## 2021-07-27 ENCOUNTER — Ambulatory Visit
Admission: RE | Admit: 2021-07-27 | Discharge: 2021-07-27 | Disposition: A | Discharge status: Home / Self Care | Payer: Medicare PPO | Source: Ambulatory Visit | Attending: Internal Medicine | Admitting: Internal Medicine

## 2021-07-27 DIAGNOSIS — I63511 Cerebral infarction due to unspecified occlusion or stenosis of right middle cerebral artery: Secondary | ICD-10-CM

## 2021-07-27 DIAGNOSIS — R2681 Unsteadiness on feet: Secondary | ICD-10-CM | POA: Diagnosis not present

## 2021-07-27 DIAGNOSIS — R278 Other lack of coordination: Secondary | ICD-10-CM

## 2021-07-27 DIAGNOSIS — R1312 Dysphagia, oropharyngeal phase: Secondary | ICD-10-CM

## 2021-07-27 DIAGNOSIS — R262 Difficulty in walking, not elsewhere classified: Secondary | ICD-10-CM

## 2021-07-27 DIAGNOSIS — Z1231 Encounter for screening mammogram for malignant neoplasm of breast: Secondary | ICD-10-CM

## 2021-07-27 DIAGNOSIS — M6281 Muscle weakness (generalized): Secondary | ICD-10-CM

## 2021-07-27 DIAGNOSIS — G8929 Other chronic pain: Secondary | ICD-10-CM

## 2021-07-27 DIAGNOSIS — R471 Dysarthria and anarthria: Secondary | ICD-10-CM

## 2021-07-27 IMAGING — MG MM DIGITAL SCREENING BILAT W/ TOMO AND CAD
6 of 10 series · 6 of 30 positions shown · non-contrast
Comparison: Previous exam(s).

CLINICAL DATA: Screening.

EXAM:
DIGITAL SCREENING BILATERAL MAMMOGRAM WITH TOMOSYNTHESIS AND CAD
TECHNIQUE: Bilateral screening digital craniocaudal and mediolateral oblique
mammograms were obtained. Bilateral screening digital breast
tomosynthesis was performed. The images were evaluated with
computer-aided detection.

[L MLO synth-2D]
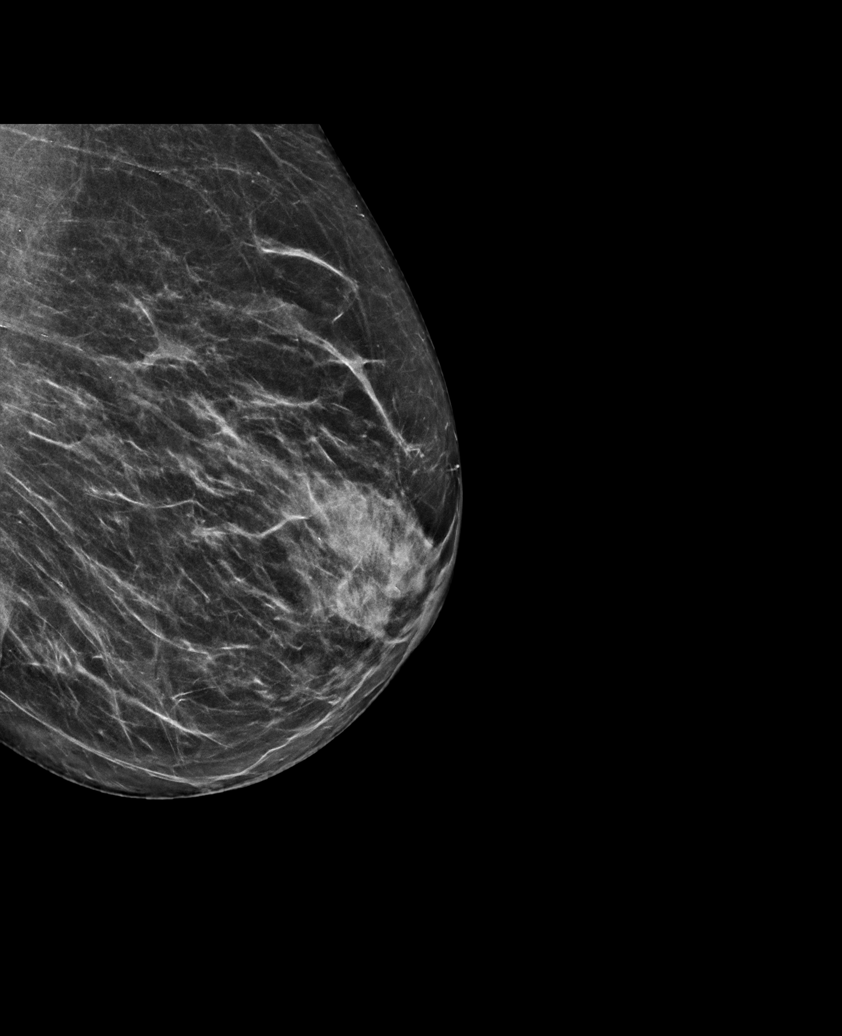

[L CC synth-2D]
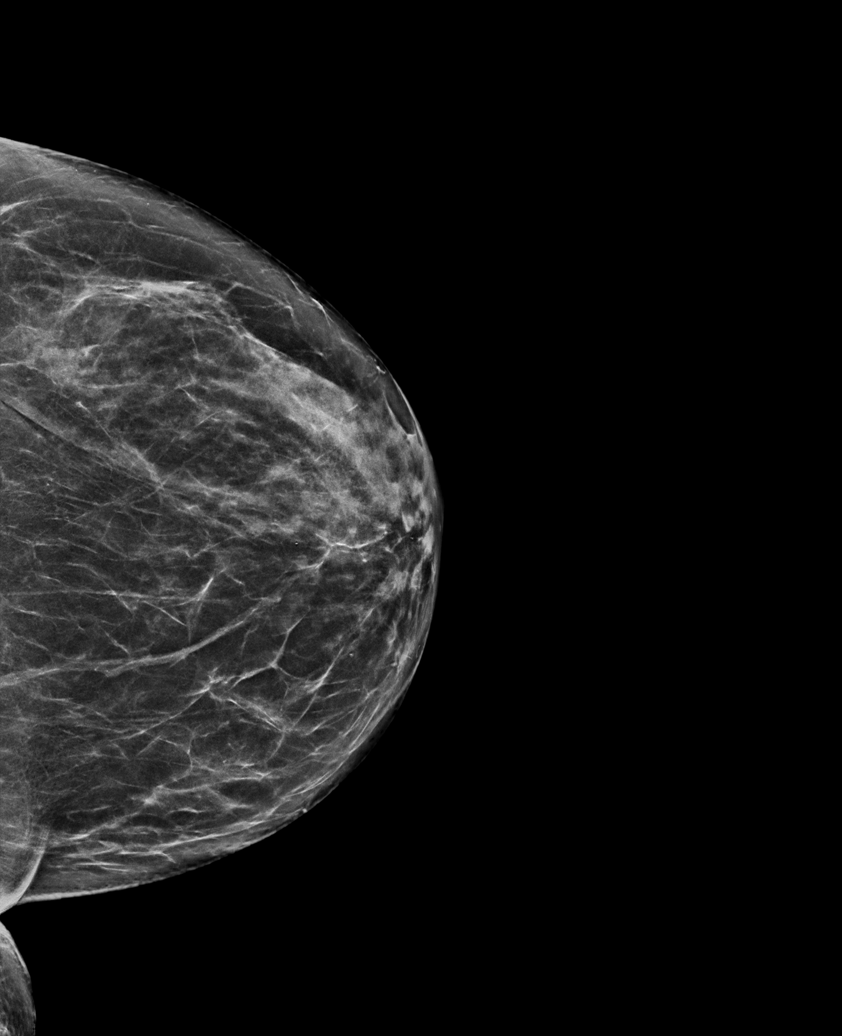

[R CC synth-2D]
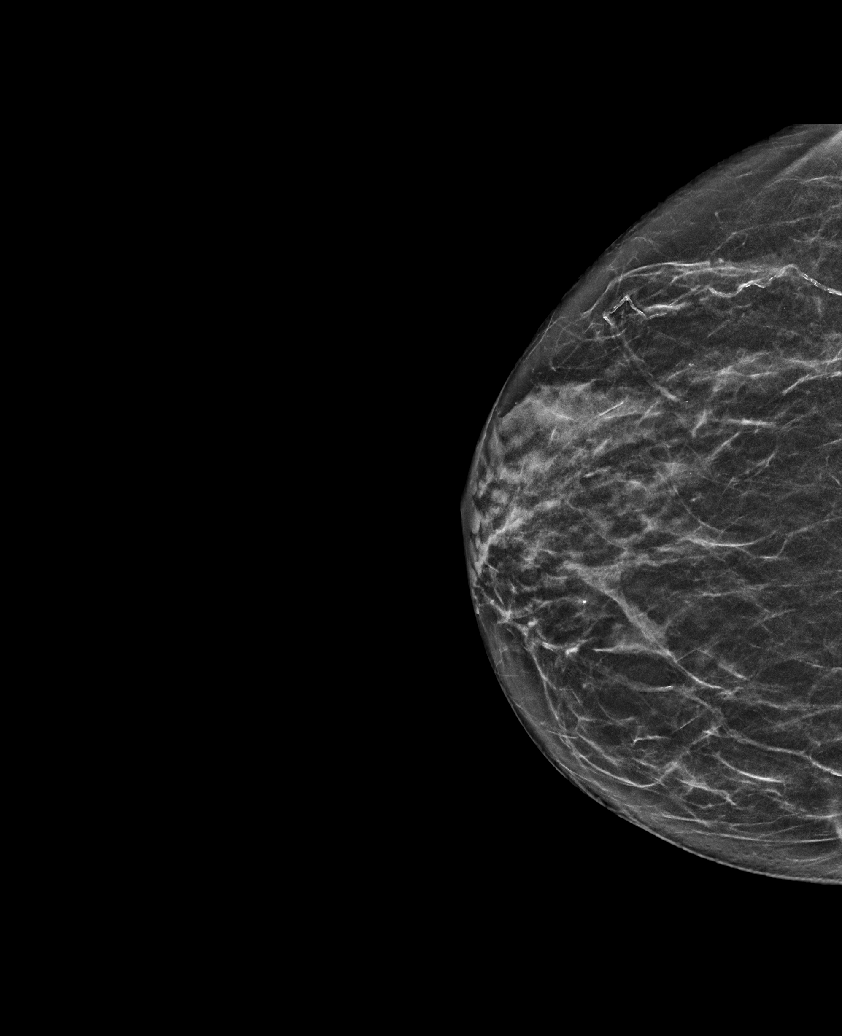

[L XCCL synth-2D]
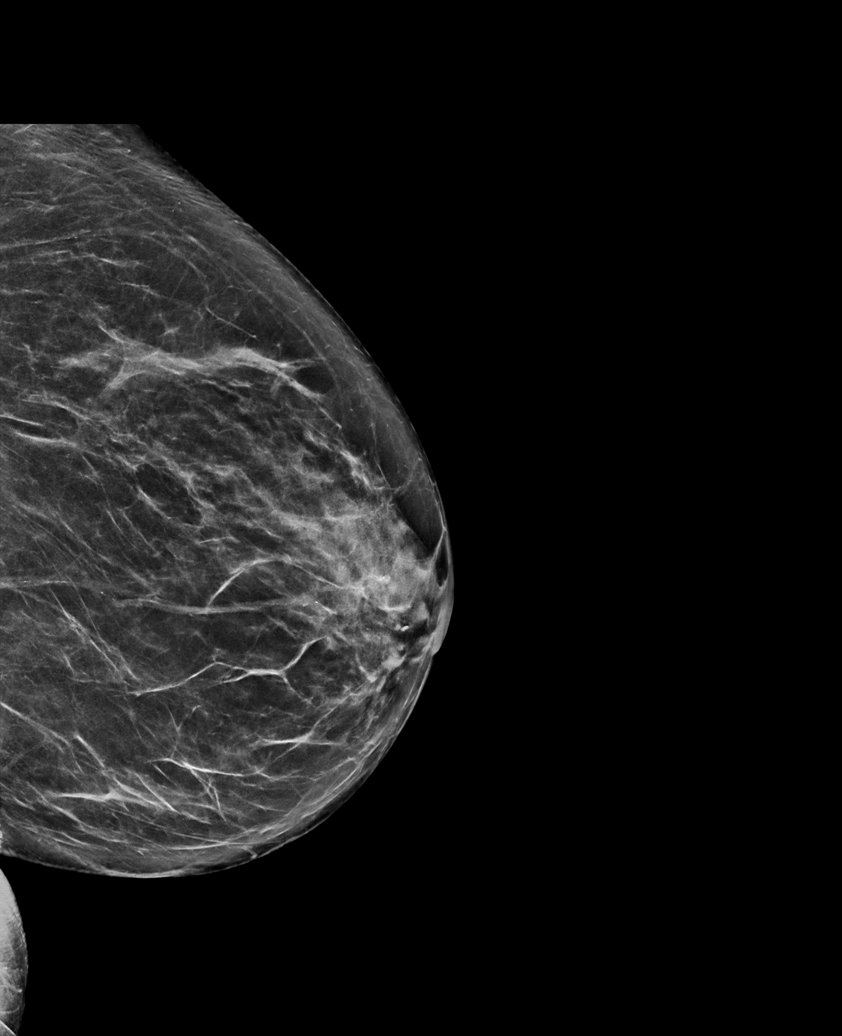

[R MLO synth-2D]
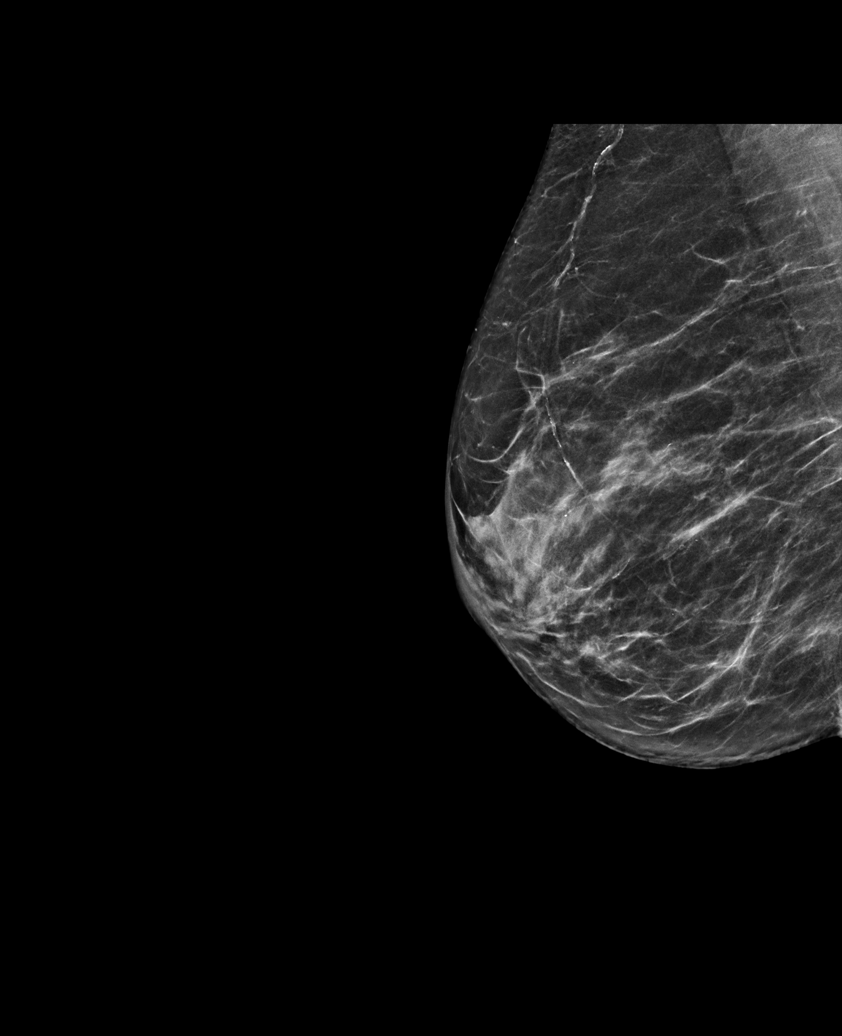

[L CC tomo · tomo slice 35/69.0]
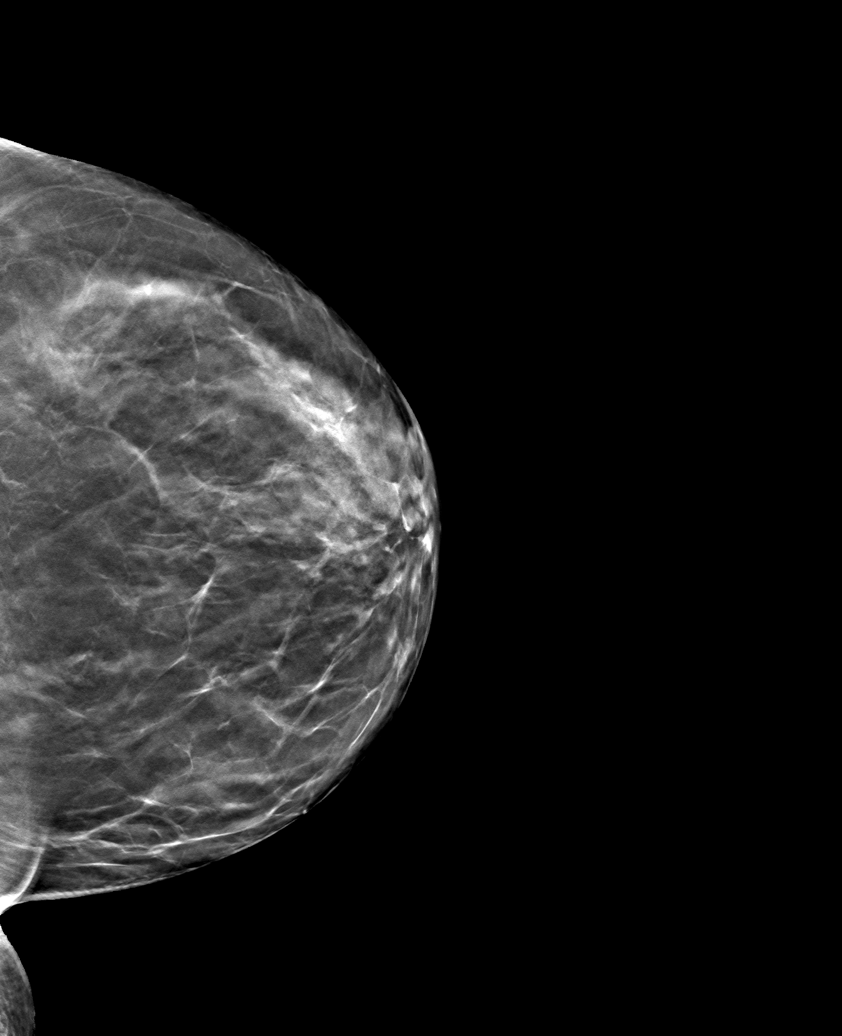

[6 of 30 positions shown; findings below may reference images not displayed]

ACR Breast Density Category c: The breast tissue is heterogeneously
dense, which may obscure small masses.
FINDINGS: There are no findings suspicious for malignancy.
IMPRESSION: No mammographic evidence of malignancy. A result letter of this
screening mammogram will be mailed directly to the patient.

RECOMMENDATION:
Screening mammogram in one year. (Code:[V2])

BI-RADS CATEGORY  1: Negative.

## 2021-07-27 NOTE — Therapy (Signed)
[place] [facility] ?[place], [place], [address] ?Phone: [phone]   Fax:  [phone] ? ?Physical Therapy Treatment ? ?Patient Details  ?Name: Teresa [name] [facility] ?MRN: [id number] ?Date of Birth: 1940/09/28 ?Referring Provider (PT): [name], MD ? ? ?Encounter Date: 07/27/2021 ? ? PT End of Session - 07/27/21 0946   ? ? Visit Number 18   ? Number of Visits 25   ? Date for PT Re-Evaluation 08/16/21   ? Authorization Type [employer] PPO-   ? Authorization Time Period Cert 03/29/38-0/86/76   ? Progress Note Due on Visit 20   ? PT Start Time [phone]   ? PT Stop Time 1015   ? PT Time Calculation (min) 42 min   ? Equipment Utilized During Treatment Gait belt   ? Activity Tolerance Patient tolerated treatment well   ? Behavior During Therapy [facility] for tasks assessed/performed   ? ?  ?  ? ?  ? ? ?Past Medical History:  ?Diagnosis Date  ? Cancer [facility])   ? skin  ? Hypothyroidism   ? Raynaud's disease   ? ? ?Past Surgical History:  ?Procedure Laterality Date  ? ABDOMINAL HYSTERECTOMY  1991  ? APPENDECTOMY  1991  ? BACK SURGERY  2010  ? Cervical fusion C4-5-6-7  ? CATARACT EXTRACTION, BILATERAL Bilateral 10/2016  ? CHOLECYSTECTOMY  1995  ? COLONOSCOPY WITH PROPOFOL N/A 12/08/2018  ? Procedure: COLONOSCOPY WITH PROPOFOL;  Surgeon: Toledo, Benay Pike, MD;  Location: [facility] ENDOSCOPY;  Service: Gastroenterology;  Laterality: N/A;  ? EYE SURGERY    ? ? ?There were no vitals filed for this visit. ? ? Subjective Assessment - 07/27/21 0935   ? ? Subjective Pt reports no falls. Pt reports hip is feeling good today.   ? Pertinent History Pt is a 81 y.o. 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 Pt completed CIR from 03/02/21-03/22/21. PMH includes: skin cancer, hypothyroidism, and Raynaud's disease, history of cervical fusion in 2010 C4-C7.  Pt reports HH PT/OT/SLP was performed from January 2nd to February 28th with focus on ADL completion, ambulation.  Pt's big goal tranisitioning to ambulating up to 3 miles a day which pt was doing prior to stroke. Pt has been ambulating in [employer] with husband but last day or two pt has noticed increased L foot drag but states it is better today. Pt denies any falls at home or community. Pt reports balance deficits with turns primarily. States her balance is good with her quad cane. Wishing to return to full independence in ambulation with household and community ambulation. Wishing to be able to asc/desc full flight of stairs with ability to carry decorative items and other household items without need for handrail and/or AD.   ? Limitations Lifting;Standing;Walking;House hold activities   ? How long can you sit comfortably? unlimited   ? How long can you stand comfortably? unlimited   ? How long can you walk comfortably? ~ 1.5 hours   ? Patient Stated Goals Return to independent walking for 3 miles. Return to to crochet.   ? Currently in Pain? No/denies   ? Pain Onset More than a month ago   ? ?  ?  ? ?  ? ?INTERVENTIONS:  ?  ?Gait belt donned and CGA provided unless otherwise noted ? ?Ambulating over red mat 6x walking perimeter of mat. Pt reports fatigue felt in B quads.  Recovery interval required.  ? ?Matrix cable  machine ?25# 10x ?LLE only 25# 10x ?40# 15x ? ?Ambulating over red mat ambulating forward/backward and laterally bilat x 4 min ? ?Mini-hopping with BLEs and UUE support in // bars. Pt cued not to use LUE due to LUE being affected limb. Pt completes 2x length of bars. Pt rates easy ? ?SLB first 20 sec static position, then pt performs vertical and horizontal head turns 2 round each LE 10x for each condition, performed bilat ? ?Treadmill gait training: pt ambulates up to 1.1 mph at 1% elevation x 5 min. Mirror used as cue, pt cued for heel-toe sequencing, step-length.  ?Following treadmill training: ?Gait training 2x10 meter without AD, with CGA, continued cues for heel-toe sequencing. ? ?Ambulating heel taps on  hedgehogs 5x through. Pt rates eay ?Ambulating  heel taps on cones 8x through. Pt knocks over cones 2x. ? ?Pt educated throughout session about proper posture and technique with exercises. Improved exercise technique, movement at target joints, use of target muscles after min to mod verbal, visual, tactile cues. ? ? ? PT Education - 07/27/21 1210   ? ? Education Details exercise technique   ? Person(s) Educated Patient   ? Methods Explanation;Demonstration;Verbal cues;Tactile cues   ? Comprehension Verbalized understanding;Returned demonstration;Verbal cues required;Need further instruction   ? ?  ?  ? ?  ? ? ? PT Short Term Goals - 06/29/21 1115   ? ?  ? PT SHORT TERM GOAL #1  ? Title Pt will be indep with HEP to improve balance, strength, and gait to optimize independence with ADL completion   ? Baseline 05/24/21: Initiated 4/7: to be advanced   ? Time 6   ? Period Weeks   ? Status On-going   ? Target Date 07/05/21   ? ?  ?  ? ?  ? ? ? ? PT Long Term Goals - 06/29/21 0932   ? ?  ? PT LONG TERM GOAL #1  ? Title Pt will improve FOTO to target score to 67 demonstrate clinically significant improvement in functional mobility   ? Baseline 05/24/21: next session 4/7: 64   ? Time 12   ? Period Weeks   ? Status On-going   ? Target Date 08/02/21   ?  ? PT LONG TERM GOAL #2  ? Title Pt will improve 5xSTS to 12 sec or less to indicate clinically significant improvement in LE strength   ? Baseline 05/24/21: 17.51 sec 4/7: 14.2 sec hands-free   ? Time 12   ? Period Weeks   ? Status Partially Met   ? Target Date 08/16/21   ?  ? PT LONG TERM GOAL #3  ? Title Pt will improve 6 MWT by 165' to indicate clinically significant improvement in community ambulation distance for geriatric stroke norms.   ? Baseline 05/24/21: 765' 4/6: 842 ft with SPC   ? Time 12   ? Period Weeks   ? Status On-going   ? Target Date 08/16/21   ?  ? PT LONG TERM GOAL #4  ? Title Pt will improve 10 m gait speed to at least 1.0 m/s to demonstrate reduced risk of  falls with community ambulation tasks.   ? Baseline 05/24/21: .63 m/s without AD 4/6: 0.83 without an AD   ? Time 12   ? Period Weeks   ? Status On-going   ? Target Date 08/16/21   ?  ? PT LONG TERM GOAL #5  ? Title Pt will improve FGA  by at  least 5 point to demonstrate clinically signifcant reduced risk of falls with dynamic balance/ambulation tasks.   ? Baseline 05/24/21: 17; 4/7: 22   ? Time 12   ? Period Weeks   ? Status Achieved   ? Target Date 08/16/21   ? ?  ?  ? ?  ? ? ? ? ? ? ? ? Plan - 07/27/21 1210   ? ? Clinical Impression Statement Pt continues to demonstrate progress AEB advancing to more challenging strengthening and balance interventions. Continued focus on treadmill gait training as well with focus on improving LLE heel-toe sequencing. While pt shows progress, the pt fatigued quickly with ambulating over compliant surfaces, and has some difficulty with heel taps on cones indicating continued need to improve SLB. The pt will benefit from further skilled PT to address these deficits to increase strength, balance and mobility and to decrease fall risk.   ? Personal Factors and Comorbidities Age;Time since onset of injury/illness/exacerbation;Comorbidity 3+;Past/Current Experience   ? Examination-Activity Limitations Squat;Stairs;Locomotion Level;Stand   ? Examination-Participation Restrictions Shop;Community Activity;[employer] Work   ? Stability/Clinical Decision Making Evolving/Moderate complexity   ? Rehab Potential Good   ? PT Frequency 2x / week   ? PT Duration 12 weeks   ? PT Treatment/Interventions ADLs/Self Care Home Management;Canalith Repostioning;Electrical Stimulation;DME Instruction;Gait training;Stair training;Functional mobility training;Therapeutic activities;Patient/family education;Therapeutic exercise;Balance training;Neuromuscular re-education;Energy conservation;Vestibular;Passive range of motion   ? PT Next Visit Plan Progress LE strength, Gait training - heel to toe sequencing, progress  balance as appropriate. Continue plan   ? PT Home Exercise Plan Seated L ankle DF; 3/10 Access Code: [email or web address]; no updates   ? Consulted and Agree with Plan of Care Patient   ? ?  ?  ? ?  ? ? ?Patient will benefit

## 2021-07-28 ENCOUNTER — Encounter: Payer: Self-pay | Admitting: Occupational Therapy

## 2021-07-28 NOTE — Patient Instructions (Signed)
Complete oral motor strengthen exercises, use compensatory swallow strategies, use strategies to avoid throat clearing ?

## 2021-07-28 NOTE — Therapy (Signed)
[place] [facility] ?[place], [place], [address] ?Phone: [phone]   Fax:  [phone] ? ?Occupational Therapy Treatment ? ?Patient Details  ?Name: Teresa [name] [facility] ?MRN: [id number] ?Date of Birth: 05-24-1940 ?No data recorded ? ?Encounter Date: 07/24/2021 ? ? OT End of Session - 07/28/21 1631   ? ? Visit Number 18   ? Number of Visits 24   ? Date for OT Re-Evaluation 08/18/21   ? OT Start Time 1100   ? OT Stop Time 1146   ? OT Time Calculation (min) 46 min   ? Activity Tolerance Patient tolerated treatment well   ? Behavior During Therapy [facility] for tasks assessed/performed   ? ?  ?  ? ?  ? ? ?Past Medical History:  ?Diagnosis Date  ? Cancer [facility])   ? skin  ? Hypothyroidism   ? Raynaud's disease   ? ? ?Past Surgical History:  ?Procedure Laterality Date  ? ABDOMINAL HYSTERECTOMY  1991  ? APPENDECTOMY  1991  ? BACK SURGERY  2010  ? Cervical fusion C4-5-6-7  ? CATARACT EXTRACTION, BILATERAL Bilateral 10/2016  ? CHOLECYSTECTOMY  1995  ? COLONOSCOPY WITH PROPOFOL N/A 12/08/2018  ? Procedure: COLONOSCOPY WITH PROPOFOL;  Surgeon: Toledo, Benay Pike, MD;  Location: [facility] ENDOSCOPY;  Service: Gastroenterology;  Laterality: N/A;  ? EYE SURGERY    ? ? ?There were no vitals filed for this visit. ? ? Subjective Assessment - 07/28/21 1629   ? ? Subjective  Pt reports she has been working on moving her arm more, pain at times in the shoulder and along bicep area on the left.  Husband performing almost all homemaking tasks except laundry.   ? Pertinent History February 26, 2021, pt reports she had a CVA, came to [facility] to ER and then was transferred to [facility] in [place] where she was admitted and after acute care she went to inpatient rehab.  Following inpt rehab, pt went home and had home health.   ? Patient Stated Goals Pt reports her goal is to get back to normal, be as independent as possible.   ? Currently in Pain? Yes   ? Pain Score 2    ? Pain Location  Arm   ? Pain Orientation Left   ? Pain Descriptors / Indicators Aching   ? Pain Type Chronic pain   ? ?  ?  ? ?  ? ? ?Patient reports arm feeling better after session last week working on shoulder stabilization and use of [place] tool to left bicep. ? ?Manual therapy: ?Patient seen this date for scapular mobilizations in supine for shoulder elevation retraction, protraction and upward rotation in preparation for use in active range of motion and reaching tasks.  Soft tissue mobilizations to left hand and wrist with carpal and metacarpal stretches to facilitate edema control. ?Use of Daisy Blossom tool to left bicep with sweeping motion to decrease fascial restrictions.  ? ?Neuro: ?Patient seen for facilitation of active movement in supine for placing hold of left shoulder to 90 degrees of shoulder flexion.  Able to maintain position for up to 5 seconds with some dropping of motion by 10 degrees.  Patient able to recover to 90 degrees of shoulder flexion with verbal cues and tapping to triceps.  Patient performing elbow extension with facilitation of triceps with her arm held in 90 degrees of shoulder flexion.  Patient able to initiate small circles in both directions with therapist assisting at  times to maintain arm position.  Shoulder protraction performed for 10 repetitions in supine with therapist providing support as needed.  Facilitation of finger flexion on the left after prolonged stretching. ? ?Response to treatment: ?Patient responding well to treatment and demonstrating improved active range of motion of left shoulder with reaching.  She is demonstrating improved control of holding objects in her left hand although still dropping occasionally.  Items are 1 to 2 inches in size and will need to progress to smaller items for grasping and manipulation in the future.  Patient has some mild pain at times in the left bicep and shoulder area but notes improvement after therapy and use of tool to decrease adhesions and  restore fascia.  Continue to work towards goals and plan of care to improve left upper extremity function for use and ADL and IADL tasks ? ? ? ? ? ? ? ? ? ? ? ? ? ? ? ? ? ? ? ? ? ? ? OT Education - 07/28/21 1630   ? ? Education Details shoulder stabilization exercises, ROM, edema control   ? Person(s) Educated Patient   ? Methods Explanation;Demonstration;Tactile cues;Verbal cues   ? Comprehension Verbalized understanding;Returned demonstration;Verbal cues required;Need further instruction;Tactile cues required   ? ?  ?  ? ?  ? ? ? ? ? ? OT Long Term Goals - 06/28/21 0904   ? ?  ? OT LONG TERM GOAL #1  ? Title Pt will be independent with home exercise program.   ? Baseline Eval: no current program, 10th visit:  continue to add new exercises as pt progresses   ? Time 12   ? Period Weeks   ? Status On-going   ? Target Date 08/18/21   ?  ? OT LONG TERM GOAL #2  ? Title Pt will complete UB and LB dressing with modified independence including buttons, snaps and zippers.   ? Baseline requires min assist at eval, 10th visit: occasional assist with buttons   ? Time 12   ? Period Weeks   ? Status On-going   ? Target Date 08/18/21   ?  ? OT LONG TERM GOAL #3  ? Title Pt will perform shower transfer with modified independence.   ? Baseline Pt requires supervision to min assist for shower transfer at home. 10th visit: supervision   ? Time 6   ? Period Weeks   ? Status On-going   ? Target Date 07/07/21   ?  ? OT LONG TERM GOAL #4  ? Title Pt will improve L hand grip by 10# to assist with holding items in left hand securely.   ? Baseline no grip in left hand at eval, 10th visit:  improved flexion but still working towards composite fisting and grip   ? Time 12   ? Period Weeks   ? Status On-going   ? Target Date 08/18/21   ?  ? OT LONG TERM GOAL #5  ? Title Pt will improve left shoulder flexion by 10 degrees to improve reaching to obtain self care items from shelf/shoulder height.   ? Baseline difficulty with reach, shoulder  flexion to 47 degrees.   ? Time 12   ? Period Weeks   ? Status On-going   ? Target Date 07/07/21   ?  ? OT LONG TERM GOAL #6  ? Title Pt will improve FOTO score to 47 or above to demonstrate a clinically relevant change in function to impact ADL tasks.   ?  Baseline score of 30 at eval   ? Time 12   ? Period Weeks   ? Status On-going   ? Target Date 08/18/21   ?  ? OT LONG TERM GOAL #7  ? Title Pt will demonstrate ability to pick up small objects and complete 9 hole peg test in less than 2 mins.   ? Baseline unable to perform at eval, 10th visit: still unable to pick up small pegs   ? Time 12   ? Period Weeks   ? Status On-going   ? Target Date 08/18/21   ? ?  ?  ? ?  ? ? ? ? ? ? ? ? Plan - 07/28/21 1632   ? ? Clinical Impression Statement Patient responding well to treatment and demonstrating improved active range of motion of left shoulder with reaching.  She is demonstrating improved control of holding objects in her left hand although still dropping occasionally.  Items are 1 to 2 inches in size and will need to progress to smaller items for grasping and manipulation in the future.  Patient has some mild pain at times in the left bicep and shoulder area but notes improvement after therapy and use of tool to decrease adhesions and restore fascia.  Continue to work towards goals and plan of care to improve left upper extremity function for use and ADL and IADL tasks   ? OT Occupational Profile and History Detailed Assessment- Review of Records and additional review of physical, cognitive, psychosocial history related to current functional performance   ? Occupational performance deficits (Please refer to evaluation for details): ADL's;IADL's;Leisure;Rest and Sleep   ? Body Structure / Function / Physical Skills ADL;Coordination;Endurance;GMC;UE functional use;Balance;IADL;Pain;Dexterity;FMC;Strength;Edema;Mobility;ROM   ? Psychosocial Skills Environmental  Adaptations;Habits;Routines and Behaviors   ? Rehab  Potential Good   ? Clinical Decision Making Several treatment options, min-mod task modification necessary   ? Comorbidities Affecting Occupational Performance: May have comorbidities impacting occupational perfor

## 2021-07-28 NOTE — Therapy (Signed)
[place] ?[facility] ?[place], [place], [address] ?Phone: [phone]   Fax:  [phone] ? ?Occupational Therapy Treatment ? ?Patient Details  ?Name: Teresa [name] [facility] ?MRN: [id number] ?Date of Birth: 1940-03-29 ?No data recorded ? ?Encounter Date: 07/27/2021 ? ? OT End of Session - 07/28/21 1645   ? ? Visit Number 19   ? Number of Visits 24   ? Date for OT Re-Evaluation 08/18/21   ? OT Start Time 1014   ? OT Stop Time 1100   ? OT Time Calculation (min) 46 min   ? Activity Tolerance Patient tolerated treatment well   ? Behavior During Therapy [facility] for tasks assessed/performed   ? ?  ?  ? ?  ? ? ?Past Medical History:  ?Diagnosis Date  ? Cancer [facility])   ? skin  ? Hypothyroidism   ? Raynaud's disease   ? ? ?Past Surgical History:  ?Procedure Laterality Date  ? ABDOMINAL HYSTERECTOMY  1991  ? APPENDECTOMY  1991  ? BACK SURGERY  2010  ? Cervical fusion C4-5-6-7  ? CATARACT EXTRACTION, BILATERAL Bilateral 10/2016  ? CHOLECYSTECTOMY  1995  ? COLONOSCOPY WITH PROPOFOL N/A 12/08/2018  ? Procedure: COLONOSCOPY WITH PROPOFOL;  Surgeon: Toledo, Benay Pike, MD;  Location: [facility] ENDOSCOPY;  Service: Gastroenterology;  Laterality: N/A;  ? EYE SURGERY    ? ? ?There were no vitals filed for this visit. ? ? Subjective Assessment - 07/28/21 1645   ? ? Subjective  Pain in left shoulder 2/10. Fingers were bending last night without the glove, fingers are cold today after walking in PT.   ? Pertinent History February 26, 2021, pt reports she had a CVA, came to [facility] to ER and then was transferred to [facility] in [place] where she was admitted and after acute care she went to inpatient rehab.  Following inpt rehab, pt went home and had home health.   ? Patient Stated Goals Pt reports her goal is to get back to normal, be as independent as possible.   ? Currently in Pain? Yes   ? Pain Score 2    ? Pain Location Arm   ? Pain Orientation Left   ? Pain Descriptors /  Indicators Aching   ? Pain Type Chronic pain   ? Pain Onset More than a month ago   ? Pain Frequency Intermittent   ? ?  ?  ? ?  ? ?Manual therapy: ?Patient seen this date for scapular mobilizations in supine for shoulder elevation retraction, protraction and upward rotation in preparation prior to neuro and active movement.   Soft tissue mobilizations to left hand and wrist with carpal and metacarpal stretches to facilitate edema control.  Use of Daisy Blossom tool to left bicep with sweeping motion to decrease fascial restrictions and decrease pain.  ?  ?Neuro: ?Patient seen for facilitation of active movement in supine for placing hold of left shoulder to 90 degrees of shoulder flexion.  Able to maintain position for 3-5 seconds. Patient performing elbow extension with facilitation of triceps with her arm held in 90 degrees of shoulder flexion.  Pt performing reaching to opposite shoulder in supine, then bringing left arm back down to table fully extending elbow with cues.  Patient able to perform small circles in both directions with therapist assisting at times to maintain arm position.  Triceps extension with shoulder in 90 degrees of flexion, touching forehead and back up.  Shoulder protraction performed for  10 repetitions in supine with therapist providing support as needed.  Facilitation of finger flexion on the left after prolonged stretching. ? ?Reaching tasks included picking up of [place] discs this week and placing onto 45 degrees angled wedge on tabletop.  Cues to decrease shoulder hiking and substitution of movements, responds well to cues both verbal and tactile. ? ?  ?Response to treatment: ?Pt continues to focus on active movement of left UE, has some mild pain in biceps but responding well to therapeutic intervention strategies.  She is very motivated and demonstrates good carryover with tasks and exercises at home.  She is improving with control of left UE with movements in both flexion and with  control on return from flexion in supine.  She is more consistent with being able to hold arm in 90 degrees of flexion of shoulder and able to correct when arm starts to drift.  Her active ROM in her left hand improving for flexion working towards composite fisting motion.  Continue to work towards goals to improve LUE ROM, strength, coordination and return to functional activities at home and in the community.   ? ? ? ? ? ? ? ? ? ? ? ? ? ? ? ? ? ? ? ? ? ? OT Education - 07/28/21 1645   ? ? Education Details shoulder stabilization exercises, ROM, edema control   ? Person(s) Educated Patient   ? Methods Explanation;Demonstration;Tactile cues;Verbal cues   ? Comprehension Verbalized understanding;Returned demonstration;Verbal cues required;Need further instruction;Tactile cues required   ? ?  ?  ? ?  ? ? ? ? ? ? OT Long Term Goals - 06/28/21 0904   ? ?  ? OT LONG TERM GOAL #1  ? Title Pt will be independent with home exercise program.   ? Baseline Eval: no current program, 10th visit:  continue to add new exercises as pt progresses   ? Time 12   ? Period Weeks   ? Status On-going   ? Target Date 08/18/21   ?  ? OT LONG TERM GOAL #2  ? Title Pt will complete UB and LB dressing with modified independence including buttons, snaps and zippers.   ? Baseline requires min assist at eval, 10th visit: occasional assist with buttons   ? Time 12   ? Period Weeks   ? Status On-going   ? Target Date 08/18/21   ?  ? OT LONG TERM GOAL #3  ? Title Pt will perform shower transfer with modified independence.   ? Baseline Pt requires supervision to min assist for shower transfer at home. 10th visit: supervision   ? Time 6   ? Period Weeks   ? Status On-going   ? Target Date 07/07/21   ?  ? OT LONG TERM GOAL #4  ? Title Pt will improve L hand grip by 10# to assist with holding items in left hand securely.   ? Baseline no grip in left hand at eval, 10th visit:  improved flexion but still working towards composite fisting and grip   ? Time  12   ? Period Weeks   ? Status On-going   ? Target Date 08/18/21   ?  ? OT LONG TERM GOAL #5  ? Title Pt will improve left shoulder flexion by 10 degrees to improve reaching to obtain self care items from shelf/shoulder height.   ? Baseline difficulty with reach, shoulder flexion to 47 degrees.   ? Time 12   ? Period Weeks   ?  Status On-going   ? Target Date 07/07/21   ?  ? OT LONG TERM GOAL #6  ? Title Pt will improve FOTO score to 47 or above to demonstrate a clinically relevant change in function to impact ADL tasks.   ? Baseline score of 30 at eval   ? Time 12   ? Period Weeks   ? Status On-going   ? Target Date 08/18/21   ?  ? OT LONG TERM GOAL #7  ? Title Pt will demonstrate ability to pick up small objects and complete 9 hole peg test in less than 2 mins.   ? Baseline unable to perform at eval, 10th visit: still unable to pick up small pegs   ? Time 12   ? Period Weeks   ? Status On-going   ? Target Date 08/18/21   ? ?  ?  ? ?  ? ? ? ? ? ? ? ? Plan - 07/28/21 1646   ? ? Clinical Impression Statement Pt continues to focus on active movement of left UE, has some mild pain in biceps but responding well to therapeutic intervention strategies.  She is very motivated and demonstrates good carryover with tasks and exercises at home.  She is improving with control of left UE with movements in both flexion and with control on return from flexion in supine.  She is more consistent with being able to hold arm in 90 degrees of flexion of shoulder and able to correct when arm starts to drift.  Her active ROM in her left hand improving for flexion working towards composite fisting motion.  Continue to work towards goals to improve LUE ROM, strength, coordination and return to functional activities at home and in the community.   ? OT Occupational Profile and History Detailed Assessment- Review of Records and additional review of physical, cognitive, psychosocial history related to current functional performance   ?  Occupational performance deficits (Please refer to evaluation for details): ADL's;IADL's;Leisure;Rest and Sleep   ? Body Structure / Function / Physical Skills ADL;Coordination;Endurance;GMC;UE functional use;Balance;I

## 2021-07-28 NOTE — Therapy (Signed)
[place] ?[facility] ?[place], [place], [address] ?Phone: [phone]   Fax:  [phone] ? ?Speech Language Pathology Treatment ? ?Patient Details  ?Name: Teresa [name] ?MRN: [id number] ?Date of Birth: 1940/09/04 ?Referring Provider (SLP): [name] ? ? ?Encounter Date: 07/27/2021 ? ? End of Session - 07/28/21 1222   ? ? Visit Number 6   ? Number of Visits 25   ? Date for SLP Re-Evaluation 09/28/21   ? Authorization Type [employer] Medicare Choice PPO   ? Authorization Time Period 07/05/2021 thru 09/28/2021   ? Authorization - Visit Number 6   ? Progress Note Due on Visit 10   ? SLP Start Time 0800   ? SLP Stop Time  0900   ? SLP Time Calculation (min) 60 min   ? Activity Tolerance Patient tolerated treatment well   ? ?  ?  ? ?  ? ? ?Past Medical History:  ?Diagnosis Date  ? Cancer [facility])   ? skin  ? Hypothyroidism   ? Raynaud's disease   ? ? ?Past Surgical History:  ?Procedure Laterality Date  ? ABDOMINAL HYSTERECTOMY  1991  ? APPENDECTOMY  1991  ? BACK SURGERY  2010  ? Cervical fusion C4-5-6-7  ? CATARACT EXTRACTION, BILATERAL Bilateral 10/2016  ? CHOLECYSTECTOMY  1995  ? COLONOSCOPY WITH PROPOFOL N/A 12/08/2018  ? Procedure: COLONOSCOPY WITH PROPOFOL;  Surgeon: Toledo, Benay Pike, MD;  Location: [facility] ENDOSCOPY;  Service: Gastroenterology;  Laterality: N/A;  ? EYE SURGERY    ? ? ?There were no vitals filed for this visit. ? ? Subjective Assessment - 07/28/21 1205   ? ? Subjective "my cheeks are sore, I have been practicing"   ? Currently in Pain? No/denies   ? ?  ?  ? ?  ? ? ? ? ? ? ? ? ADULT SLP TREATMENT - 07/28/21 0001   ? ?  ? Treatment Provided  ? Treatment provided Cognitive-Linquistic   ?  ? Cognitive-Linquistic Treatment  ? Treatment focused on Dysarthria;Patient/family/caregiver education   ? Skilled Treatment Skilled treatment session focused on pt's speech intelligibility goals as well as dysphagia goals. SLP facilitated session by providing  moderate multimodal cues to pt to achieve accuracy when perfomring oral motor exercises. Specifically, pt required cues to read directions, imitate SLP movements, slow rate of reps. With rare Min A, pt able to slow rate of speech to achieve > 95% speech intelligibility at the conversation level. SLP further facilitated session by providing review of salvia management strategies. Pt with increased throat clear throughout session. Education with printed handouts provided to pt on vocal hygiene including avoiding throat clearing. While pt voiced understanding, she is likely to require review of information in future sessions.   ? ?  ?  ? ?  ? ? ? SLP Education - 07/28/21 1222   ? ? Education Details throat clearing   ? Person(s) Educated Patient   ? Methods Explanation;Demonstration;Verbal cues;Handout   ? Comprehension Verbalized understanding;Need further instruction   ? ?  ?  ? ?  ? ? ? SLP Short Term Goals - 07/06/21 0750   ? ?  ? SLP SHORT TERM GOAL #1  ? Title Pt will utilize speech intelligibility strategies to achieve > 95% speech intelligibility at the sentence level.   ? Baseline < 90%   ? Time 10   ? Period --   sessions  ? Status New   ?  ? SLP SHORT TERM  GOAL #2  ? Title Pt will perform pharyngeal strengthening exercises with rare Min A.   ? Baseline new goal   ? Time 10   ? Period --   sessions  ? Status New   ?  ? SLP SHORT TERM GOAL #3  ? Title Pt will participate in instrumental swallow study.   ? Baseline new goal   ? Time 10   ? Period --   sessions  ? Status New   ? ?  ?  ? ?  ? ? ? SLP Long Term Goals - 07/06/21 0752   ? ?  ? SLP LONG TERM GOAL #1  ? Title Pt will be Mod I for use of speech intelligibility strategies to achieve > 95% speech intelligibility across multiple settings and communication partners.   ? Baseline 85%   ? Time 12   ? Period Weeks   ? Status New   ? Target Date 09/28/21   ? ?  ?  ? ?  ? ? ? Plan - 07/28/21 1223   ? ? Clinical Impression Statement Pt reports that she is  gaining sensation and as a result she is swallowing her salvia more often and is drooling less. In addition, her husband is not commenting on left anterior spillage of food which means that spillage has reduced. She also reports that her husband is also able to understand her speech and doesn't say "I can't understand you" as often as he was several weeks ago. Continue short course of skilled ST intervention to target oral motor weakness and it improve pt's concerns with dysphagia.   ? Speech Therapy Frequency 2x / week   ? Treatment/Interventions Aspiration precaution training;Pharyngeal strengthening exercises;Oral motor exercises;Compensatory strategies;Patient/family education;SLP instruction and feedback;Diet toleration management by SLP;Trials of upgraded texture/liquids   ? Potential to Achieve Goals Good   ? SLP Home Exercise Plan provided, see pt instructions section   ? Consulted and Agree with Plan of Care Patient   ? ?  ?  ? ?  ? ? ?Patient will benefit from skilled therapeutic intervention in order to improve the following deficits and impairments:   ?Dysphagia, oropharyngeal phase ? ?Dysarthria and anarthria ? ?Right middle cerebral artery stroke [facility]) ? ? ? ?Problem List ?Patient Active Problem List  ? Diagnosis Date Noted  ? 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 Stroke [facility]) 02/27/2021  ? History of nonmelanoma skin cancer 05/23/2014  ? ?Pharoah Goggins B. Rutherford Nail, M.S., CCC-SLP, CBIS ?Speech-Language Pathologist ?Rehabilitation Services ?Office [phone] ? ?[place], [place] ?07/28/2021, 12:27 PM ? ?[place] ?[facility] ?[place], [place], [address] ?Phone: [phone]   Fax:  [phone] ? ? ?Name: Teresa [name] ?MRN: [id number] ?Date of Birth: 07-05-1940 ? ?

## 2021-07-31 ENCOUNTER — Ambulatory Visit: Payer: Medicare PPO

## 2021-07-31 ENCOUNTER — Ambulatory Visit: Payer: Medicare PPO | Admitting: Occupational Therapy

## 2021-07-31 ENCOUNTER — Ambulatory Visit: Payer: Medicare PPO | Admitting: Speech Pathology

## 2021-07-31 DIAGNOSIS — I63511 Cerebral infarction due to unspecified occlusion or stenosis of right middle cerebral artery: Secondary | ICD-10-CM

## 2021-07-31 DIAGNOSIS — R2681 Unsteadiness on feet: Secondary | ICD-10-CM

## 2021-07-31 DIAGNOSIS — M6281 Muscle weakness (generalized): Secondary | ICD-10-CM

## 2021-07-31 DIAGNOSIS — R1312 Dysphagia, oropharyngeal phase: Secondary | ICD-10-CM

## 2021-07-31 DIAGNOSIS — R262 Difficulty in walking, not elsewhere classified: Secondary | ICD-10-CM

## 2021-07-31 DIAGNOSIS — R278 Other lack of coordination: Secondary | ICD-10-CM

## 2021-07-31 DIAGNOSIS — R471 Dysarthria and anarthria: Secondary | ICD-10-CM

## 2021-07-31 NOTE — Patient Instructions (Signed)
Continue oral motor exercises, use of speech intelligibility strategies  ?

## 2021-07-31 NOTE — Therapy (Signed)
?[place] [facility] ?[place], [place], [address] ?Phone: [phone]   Fax:  [phone] ? ?Speech Language Pathology Treatment ? ?Patient Details  ?Name: Teresa [name] [facility] ?MRN: [id number] ?Date of Birth: 03-Mar-1941 ?Referring Provider (SLP): [name] ? ? ?Encounter Date: 07/31/2021 ? ? End of Session - 07/31/21 2156   ? ? Visit Number 7   ? Number of Visits 25   ? Date for SLP Re-Evaluation 09/28/21   ? Authorization Type [employer] Medicare Choice PPO   ? Authorization Time Period 07/05/2021 thru 09/28/2021   ? Authorization - Visit Number 7   ? Progress Note Due on Visit 10   ? SLP Start Time 0800   ? SLP Stop Time  0900   ? SLP Time Calculation (min) 60 min   ? Activity Tolerance Patient tolerated treatment well   ? ?  ?  ? ?  ? ? ?Past Medical History:  ?Diagnosis Date  ? Cancer [facility])   ? skin  ? Hypothyroidism   ? Raynaud's disease   ? ? ?Past Surgical History:  ?Procedure Laterality Date  ? ABDOMINAL HYSTERECTOMY  1991  ? APPENDECTOMY  1991  ? BACK SURGERY  2010  ? Cervical fusion C4-5-6-7  ? CATARACT EXTRACTION, BILATERAL Bilateral 10/2016  ? CHOLECYSTECTOMY  1995  ? COLONOSCOPY WITH PROPOFOL N/A 12/08/2018  ? Procedure: COLONOSCOPY WITH PROPOFOL;  Surgeon: Toledo, Benay Pike, MD;  Location: [facility] ENDOSCOPY;  Service: Gastroenterology;  Laterality: N/A;  ? EYE SURGERY    ? ? ?There were no vitals filed for this visit. ? ? Subjective Assessment - 07/31/21 2149   ? ? Subjective Pt arrived with bottle of water, Luden's cough drop in and reports significantly reduced throat clearing,"my husband even noticed it"   ? Currently in Pain? No/denies   ? ?  ?  ? ?  ? ? ? ? ? ? ? ? ADULT SLP TREATMENT - 07/31/21 0001   ? ?  ? Treatment Provided  ? Treatment provided Cognitive-Linquistic   ?  ? Cognitive-Linquistic Treatment  ? Treatment focused on Dysarthria;Patient/family/caregiver education   ? Skilled Treatment Skilled treatment session focused on advancing  pt's speech intelligibility thru oral motor exercises and use of speech intelligibility strategies at the conversation level. Pt was independent with completing each oral motor exercise as well as use of speech intelligibility strategies to achieve > 95% speech intelligibiity at the complex conversation level.   ? ?  ?  ? ?  ? ? ? SLP Education - 07/31/21 2155   ? ? Education Details improvement in oral motor exercises as well as facial movement   ? Person(s) Educated Patient   ? Methods Explanation   ? Comprehension Verbalized understanding;Returned demonstration   ? ?  ?  ? ?  ? ? ? SLP Short Term Goals - 07/06/21 0750   ? ?  ? SLP SHORT TERM GOAL #1  ? Title Pt will utilize speech intelligibility strategies to achieve > 95% speech intelligibility at the sentence level.   ? Baseline < 90%   ? Time 10   ? Period --   sessions  ? Status New   ?  ? SLP SHORT TERM GOAL #2  ? Title Pt will perform pharyngeal strengthening exercises with rare Min A.   ? Baseline new goal   ? Time 10   ? Period --   sessions  ? Status New   ?  ? SLP SHORT  TERM GOAL #3  ? Title Pt will participate in instrumental swallow study.   ? Baseline new goal   ? Time 10   ? Period --   sessions  ? Status New   ? ?  ?  ? ?  ? ? ? SLP Long Term Goals - 07/06/21 0752   ? ?  ? SLP LONG TERM GOAL #1  ? Title Pt will be Mod I for use of speech intelligibility strategies to achieve > 95% speech intelligibility across multiple settings and communication partners.   ? Baseline 85%   ? Time 12   ? Period Weeks   ? Status New   ? Target Date 09/28/21   ? ?  ?  ? ?  ? ? ? Plan - 07/31/21 2157   ? ? Clinical Impression Statement Pt continues to report improved satisfaction with facial movement and states, "my husband noticed that it isn't sagging as much, the exercieses are working." She also reports, "no food hanging, very little drooling." In addition pt completed another EAT-10 that is vastly improved over previous report. As such recommend an additional  session to complete education.   ? Speech Therapy Frequency 2x / week   ? Duration 12 weeks   ? Treatment/Interventions Aspiration precaution training;Pharyngeal strengthening exercises;Oral motor exercises;Compensatory strategies;Patient/family education;SLP instruction and feedback;Diet toleration management by SLP;Trials of upgraded texture/liquids   ? Potential to Achieve Goals Good   ? SLP Home Exercise Plan provided, see pt instructions section   ? Consulted and Agree with Plan of Care Patient   ? ?  ?  ? ?  ? ? ?Patient will benefit from skilled therapeutic intervention in order to improve the following deficits and impairments:   ?Dysarthria and anarthria ? ?Dysphagia, oropharyngeal phase ? ?Right middle cerebral artery stroke [facility]) ? ? ? ?Problem List ?Patient Active Problem List  ? Diagnosis Date Noted  ? Right middle cerebral artery stroke ([place]) 03/01/2021  ? Stroke [facility]) 02/27/2021  ? History of nonmelanoma skin cancer 05/23/2014  ? ?Ishika [place] B. Rutherford Nail, M.S., CCC-SLP, CBIS ?Speech-Language Pathologist ?Rehabilitation Services ?Office [phone] ? ?[place], [place] ?07/31/2021, 10:03 PM ? ?[place] ?[facility] ?[place], [place], [address] ?Phone: [phone]   Fax:  [phone] ? ? ?Name: Dunia Pringle [facility] ?MRN: [id number] ?Date of Birth: 1940-08-21 ? ?

## 2021-07-31 NOTE — Therapy (Signed)
[place] [facility] ?[place], [place], [address] ?Phone: [phone]   Fax:  [phone] ? ?Physical Therapy Treatment ? ?Patient Details  ?Name: Teresa [name] [facility] ?MRN: [id number] ?Date of Birth: [birth year] ?Referring Provider (PT): [name], MD ? ? ?Encounter Date: 07/31/2021 ? ? PT End of Session - 08/01/21 1022   ? ? Visit Number 19   ? Number of Visits 25   ? Date for PT Re-Evaluation 08/16/21   ? Authorization Type [employer] Medicare PPO-   ? Authorization Time Period Cert 0/9/98-3/38/25   ? PT Start Time 0539   ? PT Stop Time 1013   ? PT Time Calculation (min) 41 min   ? Equipment Utilized During Treatment Gait belt   ? Activity Tolerance Patient tolerated treatment well   ? Behavior During Therapy [facility] for tasks assessed/performed   ? ?  ?  ? ?  ? ? ?Past Medical History:  ?Diagnosis Date  ? Cancer [facility])   ? skin  ? Hypothyroidism   ? Raynaud's disease   ? ? ?Past Surgical History:  ?Procedure Laterality Date  ? ABDOMINAL HYSTERECTOMY  1991  ? APPENDECTOMY  1991  ? BACK SURGERY  2010  ? Cervical fusion C4-5-6-7  ? CATARACT EXTRACTION, BILATERAL Bilateral 10/2016  ? CHOLECYSTECTOMY  1995  ? COLONOSCOPY WITH PROPOFOL N/A 12/08/2018  ? Procedure: COLONOSCOPY WITH PROPOFOL;  Surgeon: Toledo, Benay Pike, MD;  Location: [facility] ENDOSCOPY;  Service: Gastroenterology;  Laterality: N/A;  ? EYE SURGERY    ? ? ?There were no vitals filed for this visit. ? ? Subjective Assessment - 07/31/21 0937   ? ? Subjective Pt reports she can "feel" R hip, but not fully pain. Main concern is she is still dragging her foot.   ? Pertinent History Pt is a 81 y.o. 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 Pt completed CIR from 03/02/21-03/22/21. PMH includes: skin cancer, hypothyroidism, and Raynaud's disease, history of cervical fusion in 2010 C4-C7.  Pt reports HH PT/OT/SLP was performed from January 2nd to February 28th with focus on ADL completion,  ambulation. Pt's big goal tranisitioning to ambulating up to 3 miles a day which pt was doing prior to stroke. Pt has been ambulating in [employer] with husband but last day or two pt has noticed increased L foot drag but states it is better today. Pt denies any falls at home or community. Pt reports balance deficits with turns primarily. States her balance is good with her quad cane. Wishing to return to full independence in ambulation with household and community ambulation. Wishing to be able to asc/desc full flight of stairs with ability to carry decorative items and other household items without need for handrail and/or AD.   ? Limitations Lifting;Standing;Walking;House hold activities   ? How long can you sit comfortably? unlimited   ? How long can you stand comfortably? unlimited   ? How long can you walk comfortably? ~ 1.5 hours   ? Patient Stated Goals Return to independent walking for 3 miles. Return to to crochet.   ? Currently in Pain? Yes   ? Pain Location Hip   ? Pain Orientation Right   ? Pain Onset More than a month ago   ? ?  ?  ? ?  ? ? ?INTERVENTIONS:  ?  ?Gait belt donned and CGA provided unless otherwise noted ? ? ?Pt seated: ?PT assists pt with L great toe ext.  stretch 2x60 sec. ?This is followed up with stepping in place with LLE to practice L toe off ? ? ?Standing gastrocsoleus stretch LLE  2x60 sec ? ?Seated LLE DF with 5 sec holds x 10 reps ? Added 2# ankle weight over L foot to increase challenge  ? ?Talocrural joint mobilization LLE 3x30 sec bouts. ? ?Standing gastroc stretch 2x30 sec LLE ? ?Seated DF x multiple reps B ? ?Instructed pt in HEP: ? Access Code: [email or web address] ?URL: [email or web address] ?Date: 07/31/2021 ?Prepared by: Ricard Dillon ? ?Exercises ?- Standing Gastroc Stretch on Step with Counter Support  - 1 x daily - 7 x weekly - 2 sets - 2 reps - 30 hold ?- Seated Toe Raise  - 1 x daily - 5 x weekly - 3 sets - 15 reps ? ? ?NMR  ? ?Ambulation through clinic x several minutes  with focus on LLE toe-off and heel strike. Multi-modal cuing provided. ? ?Cross-over stepping 8x. Slight twinge in R hip that improved with technique modification.  ? ?Heel tap on box LLE to promote heel strike x multiple reps. ? ?Pt educated throughout session about proper posture and technique with exercises. Improved exercise technique, movement at target joints, use of target muscles after min to mod verbal, visual, tactile cues. ?  ?  ? ? PT Education - 08/01/21 1022   ? ? Education Details exercise technique, gait mechanics   ? Person(s) Educated Patient   ? Methods Explanation;Demonstration;Verbal cues;Tactile cues   ? Comprehension Verbalized understanding;Returned demonstration;Need further instruction;Tactile cues required;Verbal cues required   ? ?  ?  ? ?  ? ? ? PT Short Term Goals - 06/29/21 1115   ? ?  ? PT SHORT TERM GOAL #1  ? Title Pt will be indep with HEP to improve balance, strength, and gait to optimize independence with ADL completion   ? Baseline 05/24/21: Initiated 4/7: to be advanced   ? Time 6   ? Period Weeks   ? Status On-going   ? Target Date 07/05/21   ? ?  ?  ? ?  ? ? ? ? PT Long Term Goals - 06/29/21 0932   ? ?  ? PT LONG TERM GOAL #1  ? Title Pt will improve FOTO to target score to 67 demonstrate clinically significant improvement in functional mobility   ? Baseline 05/24/21: next session 4/7: 64   ? Time 12   ? Period Weeks   ? Status On-going   ? Target Date 08/02/21   ?  ? PT LONG TERM GOAL #2  ? Title Pt will improve 5xSTS to 12 sec or less to indicate clinically significant improvement in LE strength   ? Baseline 05/24/21: 17.51 sec 4/7: 14.2 sec hands-free   ? Time 12   ? Period Weeks   ? Status Partially Met   ? Target Date 08/16/21   ?  ? PT LONG TERM GOAL #3  ? Title Pt will improve 6 MWT by 165' to indicate clinically significant improvement in community ambulation distance for geriatric stroke norms.   ? Baseline 05/24/21: 765' 4/6: 842 ft with SPC   ? Time 12   ? Period Weeks   ?  Status On-going   ? Target Date 08/16/21   ?  ? PT LONG TERM GOAL #4  ? Title Pt will improve 10 m gait speed to at least 1.0 m/s to demonstrate reduced risk of falls with community ambulation tasks.   ? Baseline 05/24/21: .63  m/s without AD 4/6: 0.83 without an AD   ? Time 12   ? Period Weeks   ? Status On-going   ? Target Date 08/16/21   ?  ? PT LONG TERM GOAL #5  ? Title Pt will improve FGA  by at least 5 point to demonstrate clinically signifcant reduced risk of falls with dynamic balance/ambulation tasks.   ? Baseline 05/24/21: 17; 4/7: 22   ? Time 12   ? Period Weeks   ? Status Achieved   ? Target Date 08/16/21   ? ?  ?  ? ?  ? ? ? ? ? ? ? ? Plan - 08/01/21 1024   ? ? Clinical Impression Statement Focus this session on LLE toe-off and heel strike. Pt found to have limitations due to tight gastrocsol. PT added standing gastroc stretch and seated toe raise to HEP to address these deficits. The pt will benefit from further skilled PT to improve gait, strength, balance and mobility.   ? Personal Factors and Comorbidities Age;Time since onset of injury/illness/exacerbation;Comorbidity 3+;Past/Current Experience   ? Examination-Activity Limitations Squat;Stairs;Locomotion Level;Stand   ? Examination-Participation Restrictions Shop;Community Activity;[place] Work   ? Stability/Clinical Decision Making Evolving/Moderate complexity   ? Rehab Potential Good   ? PT Frequency 2x / week   ? PT Duration 12 weeks   ? PT Treatment/Interventions ADLs/Self Care Home Management;Canalith Repostioning;Electrical Stimulation;DME Instruction;Gait training;Stair training;Functional mobility training;Therapeutic activities;Patient/family education;Therapeutic exercise;Balance training;Neuromuscular re-education;Energy conservation;Vestibular;Passive range of motion   ? PT Next Visit Plan Progress LE strength, Gait training - heel to toe sequencing, progress balance as appropriate. Continue plan   ? PT Home Exercise Plan Seated L ankle DF;  3/10 Access Code: [email or web address]; 5/10:  Access Code: [email or web address]   ? Consulted and Agree with Plan of Care Patient   ? ?  ?  ? ?  ? ? ?Patient will benefit from skilled therapeutic intervention in order to improve th

## 2021-08-01 ENCOUNTER — Encounter: Payer: Self-pay | Admitting: Occupational Therapy

## 2021-08-01 NOTE — Therapy (Signed)
[place] [facility] [address] [place], [place], [address] Phone: [phone]   Fax:  [phone]  Occupational Therapy Treatment/Progress Update Reporting period from 06-26-2021 to 07-31-2021  Patient Details  Name: Teresa [name] MRN: [id number] Date of Birth: [birth year] No data recorded  Encounter Date: 07/31/2021   OT End of Session - 08/08/21 1939     Visit Number 20    Number of Visits 24    Date for OT Re-Evaluation 08/18/21    OT Start Time 1015    OT Stop Time 1100    OT Time Calculation (min) 45 min    Activity Tolerance Patient tolerated treatment well    Behavior During Therapy [facility] for tasks assessed/performed             Past Medical History:  Diagnosis Date   Cancer ([place])    skin   Hypothyroidism    Raynaud's disease     Past Surgical History:  Procedure Laterality Date   [place]   BACK SURGERY  2010   Cervical fusion C4-5-6-7   CATARACT EXTRACTION, BILATERAL Bilateral 10/2016   CHOLECYSTECTOMY  1995   COLONOSCOPY WITH PROPOFOL N/A 12/08/2018   Procedure: COLONOSCOPY WITH PROPOFOL;  Surgeon: Toledo, Benay Pike, MD;  Location: [facility] ENDOSCOPY;  Service: Gastroenterology;  Laterality: N/A;   EYE SURGERY      There were no vitals filed for this visit.   Subjective Assessment - 08/09/21 0829     Subjective  Pt reports her pain in her shoulder is increased today, 4/10.  Pain decreased to 2/10 at the end of the session.    Pertinent History February 26, 2021, pt reports she had a CVA, came to [facility] to ER and then was transferred to [facility] in [place] where she was admitted and after acute care she went to inpatient rehab.  Following inpt rehab, pt went home and had home health.    Patient Stated Goals Pt reports her goal is to get back to normal, be as independent as possible.    Currently in Pain? Yes    Pain Score 4     Pain Location Shoulder     Pain Orientation Left    Pain Descriptors / Indicators Aching    Pain Type Chronic pain    Pain Onset More than a month ago    Pain Frequency Intermittent               Manual Therapy:   Pt seen for scapular mobilizations performed in sidelying on mat for shoulder elevation, retraction, and upwards rotation.  Soft tissue massage to left bicep area to decrease pain and improve range of motion.    Neuromuscular reed:   Pt seen for scapular stabilization exercises in supine for place and hold with left arm in 90 degrees of shoulder flexion, elbow extension with arm in this same position with facilitation for triceps extension.  Facilitation of reaching to opposite shoulder and back to original position.  Place and hold with arm in 90 degrees of flexion and moving in small circles clockwise and counterclockwise.  Gross grasp and release with placing med ball pegs into grid and removing with left UE.  Pt demonstrates difficulty with picking up and manipulating pegs to place into specific slots, able to place if handed each peg.   Pt's goals updated this session with progress update.     Response to tx:  Pt remains unable to perform 9 hole peg test with left UE, improved active motion in left UE shoulder, elbow, forearm, wrist and hand.  Pt continues to complain of pain at times in shoulder which tends to decrease after therapeutic intervention.  Pt demonstrates difficulty with manipulation of small items, able to pick up ball pegs but has difficulty with turning and manipulating to place into grid.  Pt continues to benefit from skilled OT services to maximize safety and independence in necessary daily tasks.                   OT Education - 08/08/21 1939     Education Details ROM, shoulder exercises, manipulation of small objects    Person(s) Educated Patient    Methods Explanation;Demonstration;Tactile cues;Verbal cues    Comprehension Verbalized understanding;Returned  demonstration;Verbal cues required;Need further instruction;Tactile cues required                 OT Long Term Goals - 08/08/21 1941       OT LONG TERM GOAL #1   Title Pt will be independent with home exercise program.    Baseline Eval: no current program, 10th visit:  continue to add new exercises as pt progresses, 2oth:  continue to update HEP    Time 12    Period Weeks    Status On-going    Target Date 08/18/21      OT LONG TERM GOAL #2   Title Pt will complete UB and LB dressing with modified independence including buttons, snaps and zippers.    Baseline requires min assist at eval, 10th visit: occasional assist with buttons, 20th:  able to perform one handed, but difficulty with bilateral UE    Time 12    Period Weeks    Status On-going    Target Date 08/18/21      OT LONG TERM GOAL #3   Title Pt will perform shower transfer with modified independence.    Baseline Pt requires supervision to min assist for shower transfer at home. 10th visit: supervision    Time 6    Period Weeks    Status Achieved    Target Date 07/07/21      OT LONG TERM GOAL #4   Title Pt will improve L hand grip by 10# to assist with holding items in left hand securely.    Baseline no grip in left hand at eval, 10th visit:  improved flexion but still working towards composite fisting and grip. 20th:  continues to demo decreased grip    Time 12    Period Weeks    Status On-going    Target Date 08/18/21      OT LONG TERM GOAL #5   Title Pt will improve left shoulder flexion by 10 degrees to improve reaching to obtain self care items from shelf/shoulder height.    Baseline difficulty with reach, shoulder flexion to 47 degrees.    Time 12    Period Weeks    Status On-going    Target Date 07/07/21      OT LONG TERM GOAL #6   Title Pt will improve FOTO score to 47 or above to demonstrate a clinically relevant change in function to impact ADL tasks.    Baseline score of 30 at eval    Time 12     Period Weeks    Status On-going    Target Date 08/18/21      OT LONG TERM GOAL #7  Title Pt will demonstrate ability to pick up small objects and complete 9 hole peg test in less than 2 mins.    Baseline unable to perform at eval, 10th visit: still unable to pick up small pegs, 20th: unable to pick up pegs but improving    Time 12    Period Weeks    Status On-going    Target Date 08/18/21                   Plan - 08/08/21 1940     Clinical Impression Statement Pt remains unable to perform 9 hole peg test with left UE, improved active motion in left UE shoulder, elbow, forearm, wrist and hand.   ?Pt continues to complain of pain at times in shoulder which tends to decrease after therapeutic intervention.  Pt demonstrates difficulty with manipulation of small items, able to pick up ball pegs but has difficulty with turning and manipulating to place into grid.  Pt continues to benefit from skilled OT services to maximize safety and independence in necessary daily tasks.    OT Occupational Profile and History Detailed Assessment- Review of Records and additional review of physical, cognitive, psychosocial history related to current functional performance    Occupational performance deficits (Please refer to evaluation for details): ADL's;IADL's;Leisure;Rest and Sleep    Body Structure / Function / Physical Skills ADL;Coordination;Endurance;GMC;UE functional use;Balance;IADL;Pain;Dexterity;FMC;Strength;Edema;Mobility;ROM    Psychosocial Skills Environmental  Adaptations;Habits;Routines and Behaviors    Rehab Potential Good    Clinical Decision Making Several treatment options, min-mod task modification necessary    Comorbidities Affecting Occupational Performance: May have comorbidities impacting occupational performance    Modification or Assistance to Complete Evaluation  Min-Moderate modification of tasks or assist with assess necessary to complete eval    OT Frequency 2x / week     OT Duration 12 weeks    OT Treatment/Interventions Self-care/ADL training;Cryotherapy;Paraffin;Therapeutic exercise;DME and/or AE instruction;Functional Mobility Training;Balance training;Electrical Stimulation;Ultrasound;Neuromuscular education;Manual Therapy;Splinting;Moist Heat;Contrast Bath;Passive range of motion;Therapeutic activities;Patient/family education;Coping strategies training    Consulted and Agree with Plan of Care Patient             Patient will benefit from skilled therapeutic intervention in order to improve the following deficits and impairments:   Body Structure / Function / Physical Skills: ADL, Coordination, Endurance, GMC, UE functional use, Balance, IADL, Pain, Dexterity, FMC, Strength, Edema, Mobility, ROM   Psychosocial Skills: Environmental  Adaptations, Habits, Routines and Behaviors   Visit Diagnosis: Muscle weakness (generalized)  Other lack of coordination  Unsteadiness on feet    Problem List Patient Active Problem List   Diagnosis Date Noted   Right middle cerebral artery stroke ([place]) 03/01/2021   Stroke ([place]) 02/27/2021   History of nonmelanoma skin cancer 05/23/2014   Lya Holben Oneita Jolly, OTR/L, CLT  Bruna Dills, OT 08/09/2021, 8:01 PM  [facility] [address] [place], [place], [address] Phone: [phone]   Fax:  [phone]  Name: Teresa [name] MRN: [id number] Date of Birth: [birth year]

## 2021-08-03 ENCOUNTER — Ambulatory Visit: Payer: Medicare PPO

## 2021-08-03 ENCOUNTER — Ambulatory Visit: Payer: Medicare PPO | Admitting: Occupational Therapy

## 2021-08-03 ENCOUNTER — Ambulatory Visit: Payer: Medicare PPO | Admitting: Speech Pathology

## 2021-08-03 DIAGNOSIS — I63511 Cerebral infarction due to unspecified occlusion or stenosis of right middle cerebral artery: Secondary | ICD-10-CM

## 2021-08-03 DIAGNOSIS — R2681 Unsteadiness on feet: Secondary | ICD-10-CM

## 2021-08-03 DIAGNOSIS — R278 Other lack of coordination: Secondary | ICD-10-CM

## 2021-08-03 DIAGNOSIS — M6281 Muscle weakness (generalized): Secondary | ICD-10-CM

## 2021-08-03 DIAGNOSIS — M25551 Pain in right hip: Secondary | ICD-10-CM

## 2021-08-03 DIAGNOSIS — R262 Difficulty in walking, not elsewhere classified: Secondary | ICD-10-CM

## 2021-08-03 DIAGNOSIS — R1312 Dysphagia, oropharyngeal phase: Secondary | ICD-10-CM

## 2021-08-03 DIAGNOSIS — R471 Dysarthria and anarthria: Secondary | ICD-10-CM

## 2021-08-03 NOTE — Therapy (Signed)
[place] ?[facility] ?[place], [place], [address] ?Phone: [phone]   Fax:  [phone] ? ?Speech Language Pathology Treatment ?DISCHARGE SUMMARY ? ?Patient Details  ?Name: Teresa [name] ?MRN: [id number] ?Date of Birth: [birth year] ?Referring Provider (SLP): [name] ? ? ?Encounter Date: 08/03/2021 ? ? End of Session - 08/03/21 1516   ? ? Visit Number 8   ? Number of Visits 25   ? Date for SLP Re-Evaluation 09/28/21   ? Authorization Type [employer] Medicare Choice PPO   ? Authorization Time Period 07/05/2021 thru 09/28/2021   ? Authorization - Visit Number 8   ? Progress Note Due on Visit 10   ? SLP Start Time 0800   ? SLP Stop Time  0900   ? SLP Time Calculation (min) 60 min   ? Activity Tolerance Patient tolerated treatment well   ? ?  ?  ? ?  ? ? ?Past Medical History:  ?Diagnosis Date  ? Cancer [facility])   ? skin  ? Hypothyroidism   ? Raynaud's disease   ? ? ?Past Surgical History:  ?Procedure Laterality Date  ? ABDOMINAL HYSTERECTOMY  1991  ? APPENDECTOMY  1991  ? BACK SURGERY  2010  ? Cervical fusion C4-5-6-7  ? CATARACT EXTRACTION, BILATERAL Bilateral 10/2016  ? CHOLECYSTECTOMY  1995  ? COLONOSCOPY WITH PROPOFOL N/A 12/08/2018  ? Procedure: COLONOSCOPY WITH PROPOFOL;  Surgeon: Toledo, Benay Pike, MD;  Location: [facility] ENDOSCOPY;  Service: Gastroenterology;  Laterality: N/A;  ? EYE SURGERY    ? ? ?There were no vitals filed for this visit. ? ? Subjective Assessment - 08/03/21 0811   ? ? Subjective "Am I smiling" pt's facial expressions looked much more positive and happy when sitting in the waiting area. "Thursday night I choked on a pea"   ? Currently in Pain? No/denies   ? ?  ?  ? ?  ? ? ? ? ? ? ? ? ADULT SLP TREATMENT - 08/03/21 0001   ? ?  ? Treatment Provided  ? Treatment provided Cognitive-Linquistic;Dysphagia   ?  ? Cognitive-Linquistic Treatment  ? Treatment focused on Dysarthria;Patient/family/caregiver education   ? Skilled Treatment  Skilled treatment session focused on dysphagia, oral motor and dysarthria. SLP faciltiated session by providing educaiton on safe swallow strategies such as reducing distractions and making eating/swallowing a concious effort. Pt independent with oral motor exercises and is showing some good results in facial movement. At rest, pt's facial weakness is not noticable. In fact pt's facial express while sitting in the reception area appeared brighter as a result.   ? ?  ?  ? ?  ? ? ? SLP Education - 08/03/21 1516   ? ? Education Details safe swallow strategies   ? Person(s) Educated Patient   ? Methods Explanation   ? Comprehension Verbalized understanding;Returned demonstration   ? ?  ?  ? ?  ? ? ? SLP Short Term Goals - 07/06/21 0750   ? ?  ? SLP SHORT TERM GOAL #1  ? Title Pt will utilize speech intelligibility strategies to achieve > 95% speech intelligibility at the sentence level.   ? Baseline < 90%   ? Time 10   ? Period --   sessions  ? Status New   ?  ? SLP SHORT TERM GOAL #2  ? Title Pt will perform pharyngeal strengthening exercises with rare Min A.   ? Baseline new goal   ? Time 10   ?  Period --   sessions  ? Status New   ?  ? SLP SHORT TERM GOAL #3  ? Title Pt will participate in instrumental swallow study.   ? Baseline new goal   ? Time 10   ? Period --   sessions  ? Status New   ? ?  ?  ? ?  ? ? ? SLP Long Term Goals - 07/06/21 0752   ? ?  ? SLP LONG TERM GOAL #1  ? Title Pt will be Mod I for use of speech intelligibility strategies to achieve > 95% speech intelligibility across multiple settings and communication partners.   ? Baseline 85%   ? Time 12   ? Period Weeks   ? Status New   ? Target Date 09/28/21   ? ?  ?  ? ?  ? ? ? Plan - 08/03/21 1516   ? ? Clinical Impression Statement Pt has successful meant all of her goals and the scores on the EAT-10 all fall within normal limits. All education has been completed regarding speech intelligibility strategies and oral motor exercises. At this time, pt is  appropriate for discharge from skilled ST intervention.   ? Speech Therapy Frequency 2x / week   ? Duration 12 weeks   ? Treatment/Interventions Aspiration precaution training;Pharyngeal strengthening exercises;Oral motor exercises;Compensatory strategies;Patient/family education;SLP instruction and feedback;Diet toleration management by SLP;Trials of upgraded texture/liquids   ? Potential to Achieve Goals Good   ? SLP Home Exercise Plan provided, see pt instructions section   ? Consulted and Agree with Plan of Care Patient   ? ?  ?  ? ?  ? ?Problem List ?Patient Active Problem List  ? Diagnosis Date Noted  ? 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 Stroke [facility]) 02/27/2021  ? History of nonmelanoma skin cancer 05/23/2014  ? ?Ronda Rajkumar B. Rutherford Nail, M.S., CCC-SLP, CBIS ?Speech-Language Pathologist ?Rehabilitation Services ?Office [phone] ? ?[name], [name] ?08/03/2021, 3:21 PM ? ?[place] ?[place] [facility] ?[place], [place], [address] ?Phone: [phone]   Fax:  [phone] ? ? ?Name: Teresa [name] ?MRN: [id number] ?Date of Birth: 1940-10-22 ? ?

## 2021-08-03 NOTE — Patient Instructions (Signed)
Continue oral motor exercises ?

## 2021-08-03 NOTE — Therapy (Signed)
[place] [facility] ?[place], [place], [address] ?Phone: [phone]   Fax:  [phone] ? ?Physical Therapy Treatment/Physical Therapy Progress Note ? ? ?Dates of reporting period  06/29/2021   to   08/03/2021 ? ? ?Patient Details  ?Name: Teresa [name] [facility] ?MRN: [id number] ?Date of Birth: May 20, 1940 ?Referring Provider (PT): [name], MD ? ? ?Encounter Date: 08/03/2021 ? ? PT End of Session - 08/03/21 1135   ? ? Visit Number 20   ? Number of Visits 25   ? Date for PT Re-Evaluation 08/16/21   ? Authorization Type [employer] PPO-   ? Authorization Time Period Cert 08/26/82-6/65/99   ? PT Start Time 0902   ? PT Stop Time [phone]   ? PT Time Calculation (min) 41 min   ? Equipment Utilized During Treatment Gait belt   ? Activity Tolerance Patient tolerated treatment well   ? Behavior During Therapy [facility] for tasks assessed/performed   ? ?  ?  ? ?  ? ? ?Past Medical History:  ?Diagnosis Date  ? Cancer [facility])   ? skin  ? Hypothyroidism   ? Raynaud's disease   ? ? ?Past Surgical History:  ?Procedure Laterality Date  ? ABDOMINAL HYSTERECTOMY  1991  ? APPENDECTOMY  1991  ? BACK SURGERY  2010  ? Cervical fusion C4-5-6-7  ? CATARACT EXTRACTION, BILATERAL Bilateral 10/2016  ? CHOLECYSTECTOMY  1995  ? COLONOSCOPY WITH PROPOFOL N/A 12/08/2018  ? Procedure: COLONOSCOPY WITH PROPOFOL;  Surgeon: Toledo, Benay Pike, MD;  Location: [facility] ENDOSCOPY;  Service: Gastroenterology;  Laterality: N/A;  ? EYE SURGERY    ? ? ?There were no vitals filed for this visit. ? ? Subjective Assessment - 08/03/21 0903   ? ? Subjective Pt reports increased R hip pain today, 4/10.   ? Pertinent History Pt is a 81 y.o. 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 Pt completed CIR from 03/02/21-03/22/21. PMH includes: skin cancer, hypothyroidism, and Raynaud's disease, history of cervical fusion in 2010 C4-C7.  Pt reports HH PT/OT/SLP was performed from January 2nd to February 28th  with focus on ADL completion, ambulation. Pt's big goal tranisitioning to ambulating up to 3 miles a day which pt was doing prior to stroke. Pt has been ambulating in [employer] with husband but last day or two pt has noticed increased L foot drag but states it is better today. Pt denies any falls at home or community. Pt reports balance deficits with turns primarily. States her balance is good with her quad cane. Wishing to return to full independence in ambulation with household and community ambulation. Wishing to be able to asc/desc full flight of stairs with ability to carry decorative items and other household items without need for handrail and/or AD.   ? Limitations Lifting;Standing;Walking;House hold activities   ? How long can you sit comfortably? unlimited   ? How long can you stand comfortably? unlimited   ? How long can you walk comfortably? ~ 1.5 hours   ? Patient Stated Goals Return to independent walking for 3 miles. Return to to crochet.   ? Currently in Pain? Yes   ? Pain Score 4    ? Pain Location Hip   ? Pain Orientation Right   ? Pain Onset More than a month ago   ? ?  ?  ? ?  ? ? ? ? ?INTERVENTIONS - goals completed for progress note. See goal section  for details ? ?Instructed pt in technique with testing ? ?5xSTS:  ?2x warm-up trial with hands and hands-free  ?10.3 sec hands-free ? ?6MWT: 1060 ft without AD ?Heat donned to R hip while pt completes FOTO. Reports heat feels good.  ? ?FOTO: 70% ? ?10MWT: 0.93 m/s without AD ? ? ?Ascending/descending steps 2x ?Comments: Pt starts with UUE support. Able to complete recip pattern with UUE support. Without UE support able to ascend steps step-to and descend steps with UUE support step-2. ? ? ?SLB: 3-4 sec each LE ? ?Airex pad: 30-60 sec of the following ?EO, NBOS  ?EC, NBOS ?One foot on floor one on airex EO and EC each LE position ? ?EO with vertical head turns ?EO with horizontal head turns ?Comments: CGA throughout, intermittent UE support  ? ? ?Pt  educated throughout session about proper posture and technique with exercises. Improved exercise technique, movement at target joints, use of target muscles after min to mod verbal, visual, tactile cues. ? ? ? ? ? PT Short Term Goals - 08/03/21 1130   ? ?  ? PT SHORT TERM GOAL #1  ? Title Pt will be indep with HEP to improve balance, strength, and gait to optimize independence with ADL completion   ? Baseline 05/24/21: Initiated 4/7: to be advanced; 5/12: to be advanced   ? Time 6   ? Period Weeks   ? Status On-going   ? Target Date 09/14/21   ? ?  ?  ? ?  ? ? ? ? PT Long Term Goals - 08/03/21 0909   ? ?  ? PT LONG TERM GOAL #1  ? Title Pt will improve FOTO to target score to 67 demonstrate clinically significant improvement in functional mobility   ? Baseline 05/24/21: next session 4/7: 64; 5/12: 70   ? Time 12   ? Period Weeks   ? Status Achieved   ? Target Date 08/02/21   ?  ? PT LONG TERM GOAL #2  ? Title Pt will improve 5xSTS to 12 sec or less to indicate clinically significant improvement in LE strength   ? Baseline 05/24/21: 17.51 sec 4/7: 14.2 sec hands-free; 5/12: 10.3 sec hands-free   ? Time 12   ? Period Weeks   ? Status Achieved   ? Target Date 08/16/21   ?  ? PT LONG TERM GOAL #3  ? Title Pt will improve 6 MWT by 165' to indicate clinically significant improvement in community ambulation distance for geriatric stroke norms.   ? Baseline 05/24/21: 765' 4/6: 842 ft with SPC; 5/12: 1060 ft without AD   ? Time 12   ? Period Weeks   ? Status Achieved   ? Target Date 08/16/21   ?  ? PT LONG TERM GOAL #4  ? Title Pt will improve 10 m gait speed to at least 1.0 m/s to demonstrate reduced risk of falls with community ambulation tasks.   ? Baseline 05/24/21: .63 m/s without AD 4/6: 0.83 without an AD; 5/12: 0.93 m/s   ? Time 12   ? Period Weeks   ? Status Partially Met   ? Target Date 08/16/21   ?  ? PT LONG TERM GOAL #5  ? Title Pt will improve FGA  by at least 5 point to demonstrate clinically signifcant reduced risk of  falls with dynamic balance/ambulation tasks.   ? Baseline 05/24/21: 17; 4/7: 22   ? Time 12   ? Period Weeks   ? Status  Achieved   ? Target Date 08/16/21   ?  ? Additional Long Term Goals  ? Additional Long Term Goals Yes   ?  ? PT LONG TERM GOAL #6  ? Title Pt will improve 6 MWT by 165' to indicate clinically significant improvement in community ambulation distance for geriatric stroke norms.   ? Baseline 5/12: 5/12: 1060 ft without AD   ? Time 12   ? Period Weeks   ? Status New   ? Target Date 10/26/21   ?  ? PT LONG TERM GOAL #7  ? Title Patient will tolerate 5 seconds of single leg stance bilat without loss of balance to improve ability to get in and out of shower safely.   ? Baseline 5/12: 3-4 sec bilat   ? Time 12   ? Period Weeks   ? Status New   ? Target Date 10/26/21   ? ?  ?  ? ?  ? ? ? ? ? ? ? ? Plan - 08/03/21 1131   ? ? Clinical Impression Statement Goals retested for progress note. Pt making gains AEB achieving FOTO and 6MWT goals. Pt partially met 5xSTS and 10MWT goal. This indicates improved functional mobility, gait ability/capacity and a decrease in fall risk. PT created new 6MWT goal to further improve pt gait capacity and endurance. Additionally, a SLB has been added. Patient's condition has the potential to improve in response to therapy. Maximum improvement is yet to be obtained. The anticipated improvement is attainable and reasonable in a generally predictable time.   ? Personal Factors and Comorbidities Age;Time since onset of injury/illness/exacerbation;Comorbidity 3+;Past/Current Experience   ? Examination-Activity Limitations Squat;Stairs;Locomotion Level;Stand   ? Examination-Participation Restrictions Shop;Community Activity;[employer] Work   ? Stability/Clinical Decision Making Evolving/Moderate complexity   ? Rehab Potential Good   ? PT Frequency 2x / week   ? PT Duration 12 weeks   ? PT Treatment/Interventions ADLs/Self Care Home Management;Canalith Repostioning;Electrical Stimulation;DME  Instruction;Gait training;Stair training;Functional mobility training;Therapeutic activities;Patient/family education;Therapeutic exercise;Balance training;Neuromuscular re-education;Energy conservation;Ves

## 2021-08-07 ENCOUNTER — Ambulatory Visit: Payer: Medicare PPO | Admitting: Occupational Therapy

## 2021-08-07 ENCOUNTER — Encounter: Payer: Medicare PPO | Admitting: Speech Pathology

## 2021-08-07 ENCOUNTER — Ambulatory Visit: Payer: Medicare PPO

## 2021-08-07 DIAGNOSIS — R278 Other lack of coordination: Secondary | ICD-10-CM

## 2021-08-07 DIAGNOSIS — M6281 Muscle weakness (generalized): Secondary | ICD-10-CM

## 2021-08-07 DIAGNOSIS — R2681 Unsteadiness on feet: Secondary | ICD-10-CM

## 2021-08-07 DIAGNOSIS — R262 Difficulty in walking, not elsewhere classified: Secondary | ICD-10-CM

## 2021-08-07 NOTE — Therapy (Signed)
[place] [facility] ?[place], [place], [address] ?Phone: [phone]   Fax:  [phone] ? ?Physical Therapy Treatment ? ?Patient Details  ?Name: Teresa [name] ?MRN: [id number] ?Date of Birth: January 28, 1941 ?Referring Provider (PT): [name], MD ? ? ?Encounter Date: 08/07/2021 ? ? PT End of Session - 08/08/21 0937   ? ? Visit Number 21   ? Number of Visits 25   ? Date for PT Re-Evaluation 08/16/21   ? Authorization Type [employer] PPO-   ? Authorization Time Period Cert 07/26/62-6/80/32   ? PT Start Time [phone]   ? PT Stop Time 1013   ? PT Time Calculation (min) 42 min   ? Equipment Utilized During Treatment Gait belt   ? Activity Tolerance Patient tolerated treatment well   ? Behavior During Therapy [facility] for tasks assessed/performed   ? ?  ?  ? ?  ? ? ?Past Medical History:  ?Diagnosis Date  ? Cancer [name])   ? skin  ? Hypothyroidism   ? Raynaud's disease   ? ? ?Past Surgical History:  ?Procedure Laterality Date  ? ABDOMINAL HYSTERECTOMY  1991  ? APPENDECTOMY  1991  ? BACK SURGERY  2010  ? Cervical fusion C4-5-6-7  ? CATARACT EXTRACTION, BILATERAL Bilateral 10/2016  ? CHOLECYSTECTOMY  1995  ? COLONOSCOPY WITH PROPOFOL N/A 12/08/2018  ? Procedure: COLONOSCOPY WITH PROPOFOL;  Surgeon: Toledo, Benay Pike, MD;  Location: [facility] ENDOSCOPY;  Service: Gastroenterology;  Laterality: N/A;  ? EYE SURGERY    ? ? ?There were no vitals filed for this visit. ? ? Subjective Assessment - 08/07/21 0932   ? ? Subjective Pt rates LUE pain as a 3-4/10. Pt says moving it makes it feel better. Pt "swayed" into door frame, has bruises on LUE. Pt reports this happened Sunday. Pt reports increased unsteadiness over past few days.   ? Pertinent History Pt is a 81 y.o. 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 Pt completed CIR from 03/02/21-03/22/21. PMH includes: skin cancer, hypothyroidism, and Raynaud's disease, history of cervical fusion in 2010 C4-C7.   Pt reports HH PT/OT/SLP was performed from January 2nd to February 28th with focus on ADL completion, ambulation. Pt's big goal tranisitioning to ambulating up to 3 miles a day which pt was doing prior to stroke. Pt has been ambulating in [employer] with husband but last day or two pt has noticed increased L foot drag but states it is better today. Pt denies any falls at home or community. Pt reports balance deficits with turns primarily. States her balance is good with her quad cane. Wishing to return to full independence in ambulation with household and community ambulation. Wishing to be able to asc/desc full flight of stairs with ability to carry decorative items and other household items without need for handrail and/or AD.   ? Limitations Lifting;Standing;Walking;House hold activities   ? How long can you sit comfortably? unlimited   ? How long can you stand comfortably? unlimited   ? How long can you walk comfortably? ~ 1.5 hours   ? Patient Stated Goals Return to independent walking for 3 miles. Return to to crochet.   ? Currently in Pain? Yes   ? Pain Score 4    ? Pain Orientation Left   ? Pain Onset More than a month ago   ? ?  ?  ? ?  ? ? ?INTERVENTIONS - gait belt donned and CGA  provided unless otherwise specified ? ?Gait- ?Ambulating/gait analysis. Pt exhibits R elevated shoulder, L depressed shoulder and lateral lean to L side, every few steps with L side shift 2x 148, cuing for upright posture.  ? ? ?NMR-  ?SLB  x multiple bouts each LE 20-30 sec each ? ?Airex: ?EO WBOS 30 sec ?EC NBOS 2x30-45 sec ? ?Ascending/descending steps 3x ?Pt starts with UUE support. Able to complete recip pattern with UUE support. exhibits some L foot drag with descending, decreasing stability when attempting without UE support with descending but not ascending. ? ?Step-ups onto and off of 6" step alt. leading LE no UE support x multiple reps  ? ?Two airex pads: lateral stepping onto 2 airex pads and forward/backward stepping onto  2 airex pads x multiple reps of each ? ?On half-foam: ?Static standing 30 sec ?Static standing in DF 2x30 sec; adjusted due to intensity of stretch felt ?Static standing in PF 2x30 sec ? ?TherEx ?Leg press ?LLE only- ?25# 2x10-12 ?27# 10x ? ? ? ?Pt educated throughout session about proper posture and technique with exercises. Improved exercise technique, movement at target joints, use of target muscles after min to mod verbal, visual, tactile cues. ? ? ? ? ? ? ? ? PT Education - 08/08/21 0937   ? ? Education Details exercise technique, body mechanics   ? Person(s) Educated Patient   ? Methods Explanation;Demonstration;Verbal cues   ? Comprehension Verbalized understanding;Returned demonstration;Need further instruction   ? ?  ?  ? ?  ? ? ? PT Short Term Goals - 08/03/21 1130   ? ?  ? PT SHORT TERM GOAL #1  ? Title Pt will be indep with HEP to improve balance, strength, and gait to optimize independence with ADL completion   ? Baseline 05/24/21: Initiated 4/7: to be advanced; 5/12: to be advanced   ? Time 6   ? Period Weeks   ? Status On-going   ? Target Date 09/14/21   ? ?  ?  ? ?  ? ? ? ? PT Long Term Goals - 08/03/21 0909   ? ?  ? PT LONG TERM GOAL #1  ? Title Pt will improve FOTO to target score to 67 demonstrate clinically significant improvement in functional mobility   ? Baseline 05/24/21: next session 4/7: 64; 5/12: 70   ? Time 12   ? Period Weeks   ? Status Achieved   ? Target Date 08/02/21   ?  ? PT LONG TERM GOAL #2  ? Title Pt will improve 5xSTS to 12 sec or less to indicate clinically significant improvement in LE strength   ? Baseline 05/24/21: 17.51 sec 4/7: 14.2 sec hands-free; 5/12: 10.3 sec hands-free   ? Time 12   ? Period Weeks   ? Status Achieved   ? Target Date 08/16/21   ?  ? PT LONG TERM GOAL #3  ? Title Pt will improve 6 MWT by 165' to indicate clinically significant improvement in community ambulation distance for geriatric stroke norms.   ? Baseline 05/24/21: 765' 4/6: 842 ft with SPC; 5/12: 1060  ft without AD   ? Time 12   ? Period Weeks   ? Status Achieved   ? Target Date 08/16/21   ?  ? PT LONG TERM GOAL #4  ? Title Pt will improve 10 m gait speed to at least 1.0 m/s to demonstrate reduced risk of falls with community ambulation tasks.   ? Baseline 05/24/21: .63 m/s without  AD 4/6: 0.83 without an AD; 5/12: 0.93 m/s   ? Time 12   ? Period Weeks   ? Status Partially Met   ? Target Date 08/16/21   ?  ? PT LONG TERM GOAL #5  ? Title Pt will improve FGA  by at least 5 point to demonstrate clinically signifcant reduced risk of falls with dynamic balance/ambulation tasks.   ? Baseline 05/24/21: 17; 4/7: 22   ? Time 12   ? Period Weeks   ? Status Achieved   ? Target Date 08/16/21   ?  ? Additional Long Term Goals  ? Additional Long Term Goals Yes   ?  ? PT LONG TERM GOAL #6  ? Title Pt will improve 6 MWT by 165' to indicate clinically significant improvement in community ambulation distance for geriatric stroke norms.   ? Baseline 5/12: 5/12: 1060 ft without AD   ? Time 12   ? Period Weeks   ? Status New   ? Target Date 10/26/21   ?  ? PT LONG TERM GOAL #7  ? Title Patient will tolerate 5 seconds of single leg stance bilat without loss of balance to improve ability to get in and out of shower safely.   ? Baseline 5/12: 3-4 sec bilat   ? Time 12   ? Period Weeks   ? Status New   ? Target Date 10/26/21   ? ?  ?  ? ?  ? ? ? ? ? ? ? ? Plan - 08/08/21 0938   ? ? Clinical Impression Statement Pt exhibits L shift with gait on this date, possibly influenced by L UE pain/heaviness (chronic). Instructed pt in correct gait mechanics/posture and she was able to correct in session. Pt advanced with balance training on stairs, however, still has difficulty with LLE foot drag when descending steps. The pt will benefit from further skilled PT to improve gait, strength, balance and mobility.   ? Personal Factors and Comorbidities Age;Time since onset of injury/illness/exacerbation;Comorbidity 3+;Past/Current Experience   ?  Examination-Activity Limitations Squat;Stairs;Locomotion Level;Stand   ? Examination-Participation Restrictions Shop;Community Activity;[employer] Work   ? Stability/Clinical Decision Making Evolving/Moderate complexi

## 2021-08-09 ENCOUNTER — Encounter: Payer: Self-pay | Admitting: Occupational Therapy

## 2021-08-09 NOTE — Therapy (Signed)
[place] [facility] SERVICES [address] [place], [place], [address] Phone: [phone]   Fax:  [phone]  Occupational Therapy Treatment  Patient Details  Name: Teresa [name] MRN: [id number] Date of Birth: 08/03/1940 No data recorded  Encounter Date: 08/03/2021   OT End of Session - 08/09/21 2005     Visit Number 21    Number of Visits 24    Date for OT Re-Evaluation 08/18/21    OT Start Time 1016    OT Stop Time 1100    OT Time Calculation (min) 44 min    Activity Tolerance Patient tolerated treatment well    Behavior During Therapy [facility] for tasks assessed/performed             Past Medical History:  Diagnosis Date   Cancer ([place])    skin   Hypothyroidism    Raynaud's disease     Past Surgical History:  Procedure Laterality Date   [place]   BACK SURGERY  2010   Cervical fusion C4-5-6-7   CATARACT EXTRACTION, BILATERAL Bilateral 10/2016   CHOLECYSTECTOMY  1995   COLONOSCOPY WITH PROPOFOL N/A 12/08/2018   Procedure: COLONOSCOPY WITH PROPOFOL;  Surgeon: Toledo, Benay Pike, MD;  Location: [facility] ENDOSCOPY;  Service: Gastroenterology;  Laterality: N/A;   EYE SURGERY      There were no vitals filed for this visit.   Subjective Assessment - 08/09/21 2004     Subjective  Pt reports she heard a pop in her left arm yesterday, reports it doesn't feel any different than before.    Pertinent History February 26, 2021, pt reports she had a CVA, came to [facility] to ER and then was transferred to [facility] in [place] where she was admitted and after acute care she went to inpatient rehab.  Following inpt rehab, pt went home and had home health.    Patient Stated Goals Pt reports her goal is to get back to normal, be as independent as possible.    Currently in Pain? Yes    Pain Score 3     Pain Location Shoulder    Pain Orientation Left    Pain Descriptors / Indicators Aching     Pain Type Chronic pain    Pain Onset More than a month ago    Pain Frequency Intermittent             Manual Therapy:   Pt seen for scapular mobilizations performed in sidelying on mat for shoulder elevation, retraction, and upwards rotation.  Soft tissue massage to left bicep area to decrease pain and improve range of motion.     Neuromuscular reed:   Pt seen for scapular stabilization exercises in supine for place and hold with left arm in 90 degrees of shoulder flexion, elbow extension with arm in this same position with facilitation for triceps extension.  Facilitation of reaching to opposite shoulder and back to original position.  Place and hold with arm in 90 degrees of flexion with slow descent from this position with emphasis on control of the arm.  Prolonged stretching towards ER.  Mild resistive elbow extension in supine, active forearm supination.    Therapeutic Exercises:  In sitting, pt performing ROM with forward reaching with left hand on 45 degree angle wedge, pushing washcloth to the top of the wedge then performing diagonal patterns.  Cues required at times for finger extension.  Resistive light blue putty for  grip, lateral pinch, cues for proper form and technique.   Response to tx: Pt continues to progress well, she has some shoulder pain at times, responds well to tx.  Able to demonstrate active shoulder flexion to 110 today in sitting.  Grip and pinch slowly improving but still has some mild edema in her left digits which limit full composite fisting with active movement.  She still wears her compression glove as needed.  Continue to work towards goals in plan of care to decrease pain, increase active motion, increase functional use of left UE.                      OT Education - 08/09/21 2153     Education Details ROM, shoulder exercises, manipulation of small objects    Person(s) Educated Patient    Methods Explanation;Demonstration;Tactile  cues;Verbal cues    Comprehension Verbalized understanding;Returned demonstration;Verbal cues required;Need further instruction;Tactile cues required                 OT Long Term Goals - 08/08/21 1941       OT LONG TERM GOAL #1   Title Pt will be independent with home exercise program.    Baseline Eval: no current program, 10th visit:  continue to add new exercises as pt progresses, 2oth:  continue to update HEP    Time 12    Period Weeks    Status On-going    Target Date 08/18/21      OT LONG TERM GOAL #2   Title Pt will complete UB and LB dressing with modified independence including buttons, snaps and zippers.    Baseline requires min assist at eval, 10th visit: occasional assist with buttons, 20th:  able to perform one handed, but difficulty with bilateral UE    Time 12    Period Weeks    Status On-going    Target Date 08/18/21      OT LONG TERM GOAL #3   Title Pt will perform shower transfer with modified independence.    Baseline Pt requires supervision to min assist for shower transfer at home. 10th visit: supervision    Time 6    Period Weeks    Status Achieved    Target Date 07/07/21      OT LONG TERM GOAL #4   Title Pt will improve L hand grip by 10# to assist with holding items in left hand securely.    Baseline no grip in left hand at eval, 10th visit:  improved flexion but still working towards composite fisting and grip. 20th:  continues to demo decreased grip    Time 12    Period Weeks    Status On-going    Target Date 08/18/21      OT LONG TERM GOAL #5   Title Pt will improve left shoulder flexion by 10 degrees to improve reaching to obtain self care items from shelf/shoulder height.    Baseline difficulty with reach, shoulder flexion to 47 degrees.    Time 12    Period Weeks    Status On-going    Target Date 07/07/21      OT LONG TERM GOAL #6   Title Pt will improve FOTO score to 47 or above to demonstrate a clinically relevant change in  function to impact ADL tasks.    Baseline score of 30 at eval    Time 12    Period Weeks    Status On-going  Target Date 08/18/21      OT LONG TERM GOAL #7   Title Pt will demonstrate ability to pick up small objects and complete 9 hole peg test in less than 2 mins.    Baseline unable to perform at eval, 10th visit: still unable to pick up small pegs, 20th: unable to pick up pegs but improving    Time 12    Period Weeks    Status On-going    Target Date 08/18/21                   Plan - 08/09/21 2005     Clinical Impression Statement Pt continues to progress well, she has some shoulder pain at times, responds well to tx.  Able to demonstrate active shoulder flexion to 110 today in sitting.  Grip and pinch slowly improving but still has some mild edema in her left digits which limit full composite fisting with active movement.  She still wears her compression glove as needed.  Continue to work towards goals in plan of care to decrease pain, increase active motion, increase functional use of left UE.    OT Occupational Profile and History Detailed Assessment- Review of Records and additional review of physical, cognitive, psychosocial history related to current functional performance    Occupational performance deficits (Please refer to evaluation for details): ADL's;IADL's;Leisure;Rest and Sleep    Body Structure / Function / Physical Skills ADL;Coordination;Endurance;GMC;UE functional use;Balance;IADL;Pain;Dexterity;FMC;Strength;Edema;Mobility;ROM    Psychosocial Skills Environmental  Adaptations;Habits;Routines and Behaviors    Rehab Potential Good    Clinical Decision Making Several treatment options, min-mod task modification necessary    Comorbidities Affecting Occupational Performance: May have comorbidities impacting occupational performance    Modification or Assistance to Complete Evaluation  Min-Moderate modification of tasks or assist with assess necessary to complete  eval    OT Frequency 2x / week    OT Duration 12 weeks    OT Treatment/Interventions Self-care/ADL training;Cryotherapy;Paraffin;Therapeutic exercise;DME and/or AE instruction;Functional Mobility Training;Balance training;Electrical Stimulation;Ultrasound;Neuromuscular education;Manual Therapy;Splinting;Moist Heat;Contrast Bath;Passive range of motion;Therapeutic activities;Patient/family education;Coping strategies training    Consulted and Agree with Plan of Care Patient             Patient will benefit from skilled therapeutic intervention in order to improve the following deficits and impairments:   Body Structure / Function / Physical Skills: ADL, Coordination, Endurance, GMC, UE functional use, Balance, IADL, Pain, Dexterity, FMC, Strength, Edema, Mobility, ROM   Psychosocial Skills: Environmental  Adaptations, Habits, Routines and Behaviors   Visit Diagnosis: Muscle weakness (generalized)  Other lack of coordination  Unsteadiness on feet    Problem List Patient Active Problem List   Diagnosis Date Noted   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

## 2021-08-09 NOTE — Therapy (Signed)
[place] [facility] SERVICES [address] [place], [place], [address] Phone: [phone]   Fax:  [phone]  Occupational Therapy Treatment  Patient Details  Name: Teresa [name] MRN: [id number] Date of Birth: 06-20-1940 No data recorded  Encounter Date: 08/07/2021   OT End of Session - 08/09/21 2206     Visit Number 22    Number of Visits 24    Date for OT Re-Evaluation 08/18/21    OT Start Time 1014    OT Stop Time 1100    OT Time Calculation (min) 46 min    Activity Tolerance Patient tolerated treatment well    Behavior During Therapy [facility] for tasks assessed/performed             Past Medical History:  Diagnosis Date   Cancer ([place])    skin   Hypothyroidism    Raynaud's disease     Past Surgical History:  Procedure Laterality Date   [place]   BACK SURGERY  2010   Cervical fusion C4-5-6-7   CATARACT EXTRACTION, BILATERAL Bilateral 10/2016   CHOLECYSTECTOMY  1995   COLONOSCOPY WITH PROPOFOL N/A 12/08/2018   Procedure: COLONOSCOPY WITH PROPOFOL;  Surgeon: Toledo, Benay Pike, MD;  Location: [facility] ENDOSCOPY;  Service: Gastroenterology;  Laterality: N/A;   EYE SURGERY      There were no vitals filed for this visit.   Subjective Assessment - 08/09/21 2204     Subjective  Pt reports she will be going next week to see the musical Frozen, one of her granddaughters would love to go with her but she doesn't have an extra ticket.  Pt reports pain in the left shoulder but reports it always feels better after therapy.    Pertinent History February 26, 2021, pt reports she had a CVA, came to [facility] to ER and then was transferred to [facility] in [place] where she was admitted and after acute care she went to inpatient rehab.  Following inpt rehab, pt went home and had home health.    Patient Stated Goals Pt reports her goal is to get back to normal, be as independent as possible.     Currently in Pain? Yes    Pain Score 3     Pain Location Shoulder    Pain Orientation Left    Pain Descriptors / Indicators Aching    Pain Type Chronic pain    Pain Onset More than a month ago    Pain Frequency Intermittent             Manual Therapy:   Pt seen for scapular mobilizations performed in sidelying on mat for shoulder elevation, retraction, and upwards rotation.  Soft tissue massage to left bicep area to decrease pain and improve range of motion.     Neuromuscular reed:   Pt seen for scapular stabilization exercises in supine for place and hold with left arm in 90 degrees of shoulder flexion, elbow extension with arm in this same position with facilitation for triceps extension.  Facilitation of reaching to opposite shoulder and back to original position.  Place and hold with arm in 90 degrees of flexion with slow descent from this position with emphasis on control of the arm.  Prolonged stretching towards ER.  Mild resistive elbow extension in supine, active forearm supination.   Pt engaged in activity with focus on grasp and release of small PVC pipe pieces and also attempting to  put pieces together with connectors.  She worked on the task 2 ways, one with holding the connector stable with left hand while right hand attached the pieces and 2) worked towards using left hand to attach and remove the pieces.  She demonstrates decreased strength and pinch with using left hand to put the pieces together securely.    Response to tx: Pt progressing well, remains limited by pain in left arm around bicep area when performing ROM exercises.  She responds well to manual therapy to decrease pain and improve ROM.  She is continuing to work towards developing grasp and release of items as well as gross level manipulation skills.  She remains motivated and is consistent with home program.  Edema in hand and gradually decreased and pt is able to demonstrate improved digit flexion.  Continue to  work towards goals in plan of care to improve LUE functional use for ADL and IADL tasks.                         OT Education - 08/09/21 2205     Education Details ROM, shoulder exercises, gross grasp and release    Person(s) Educated Patient    Methods Explanation;Demonstration;Tactile cues;Verbal cues    Comprehension Verbalized understanding;Returned demonstration;Verbal cues required;Need further instruction;Tactile cues required                 OT Long Term Goals - 08/08/21 1941       OT LONG TERM GOAL #1   Title Pt will be independent with home exercise program.    Baseline Eval: no current program, 10th visit:  continue to add new exercises as pt progresses, 2oth:  continue to update HEP    Time 12    Period Weeks    Status On-going    Target Date 08/18/21      OT LONG TERM GOAL #2   Title Pt will complete UB and LB dressing with modified independence including buttons, snaps and zippers.    Baseline requires min assist at eval, 10th visit: occasional assist with buttons, 20th:  able to perform one handed, but difficulty with bilateral UE    Time 12    Period Weeks    Status On-going    Target Date 08/18/21      OT LONG TERM GOAL #3   Title Pt will perform shower transfer with modified independence.    Baseline Pt requires supervision to min assist for shower transfer at home. 10th visit: supervision    Time 6    Period Weeks    Status Achieved    Target Date 07/07/21      OT LONG TERM GOAL #4   Title Pt will improve L hand grip by 10# to assist with holding items in left hand securely.    Baseline no grip in left hand at eval, 10th visit:  improved flexion but still working towards composite fisting and grip. 20th:  continues to demo decreased grip    Time 12    Period Weeks    Status On-going    Target Date 08/18/21      OT LONG TERM GOAL #5   Title Pt will improve left shoulder flexion by 10 degrees to improve reaching to obtain self  care items from shelf/shoulder height.    Baseline difficulty with reach, shoulder flexion to 47 degrees.    Time 12    Period Weeks    Status On-going  Target Date 07/07/21      OT LONG TERM GOAL #6   Title Pt will improve FOTO score to 47 or above to demonstrate a clinically relevant change in function to impact ADL tasks.    Baseline score of 30 at eval    Time 12    Period Weeks    Status On-going    Target Date 08/18/21      OT LONG TERM GOAL #7   Title Pt will demonstrate ability to pick up small objects and complete 9 hole peg test in less than 2 mins.    Baseline unable to perform at eval, 10th visit: still unable to pick up small pegs, 20th: unable to pick up pegs but improving    Time 12    Period Weeks    Status On-going    Target Date 08/18/21                   Plan - 08/09/21 2206     Clinical Impression Statement Pt progressing well, remains limited by pain in left arm around bicep area when performing ROM exercises.  She responds well to manual therapy to decrease pain and improve ROM.  She is continuing to work towards developing grasp and release of items as well as gross level manipulation skills.  She remains motivated and is consistent with home program.  Edema in hand and gradually decreased and pt is able to demonstrate improved digit flexion.  Continue to work towards goals in plan of care to improve LUE functional use for ADL and IADL tasks.    OT Occupational Profile and History Detailed Assessment- Review of Records and additional review of physical, cognitive, psychosocial history related to current functional performance    Occupational performance deficits (Please refer to evaluation for details): ADL's;IADL's;Leisure;Rest and Sleep    Body Structure / Function / Physical Skills ADL;Coordination;Endurance;GMC;UE functional use;Balance;IADL;Pain;Dexterity;FMC;Strength;Edema;Mobility;ROM    Psychosocial Skills Environmental   Adaptations;Habits;Routines and Behaviors    Rehab Potential Good    Clinical Decision Making Several treatment options, min-mod task modification necessary    Comorbidities Affecting Occupational Performance: May have comorbidities impacting occupational performance    Modification or Assistance to Complete Evaluation  Min-Moderate modification of tasks or assist with assess necessary to complete eval    OT Frequency 2x / week    OT Duration 12 weeks    OT Treatment/Interventions Self-care/ADL training;Cryotherapy;Paraffin;Therapeutic exercise;DME and/or AE instruction;Functional Mobility Training;Balance training;Electrical Stimulation;Ultrasound;Neuromuscular education;Manual Therapy;Splinting;Moist Heat;Contrast Bath;Passive range of motion;Therapeutic activities;Patient/family education;Coping strategies training    Consulted and Agree with Plan of Care Patient             Patient will benefit from skilled therapeutic intervention in order to improve the following deficits and impairments:   Body Structure / Function / Physical Skills: ADL, Coordination, Endurance, GMC, UE functional use, Balance, IADL, Pain, Dexterity, FMC, Strength, Edema, Mobility, ROM   Psychosocial Skills: Environmental  Adaptations, Habits, Routines and Behaviors   Visit Diagnosis: Muscle weakness (generalized)  Other lack of coordination  Unsteadiness on feet    Problem List Patient Active Problem List   Diagnosis Date Noted   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

## 2021-08-10 ENCOUNTER — Ambulatory Visit: Payer: Medicare PPO

## 2021-08-10 ENCOUNTER — Encounter: Payer: Medicare PPO | Admitting: Speech Pathology

## 2021-08-10 DIAGNOSIS — R278 Other lack of coordination: Secondary | ICD-10-CM

## 2021-08-10 DIAGNOSIS — R262 Difficulty in walking, not elsewhere classified: Secondary | ICD-10-CM

## 2021-08-10 DIAGNOSIS — R2681 Unsteadiness on feet: Secondary | ICD-10-CM | POA: Diagnosis not present

## 2021-08-10 DIAGNOSIS — I63511 Cerebral infarction due to unspecified occlusion or stenosis of right middle cerebral artery: Secondary | ICD-10-CM

## 2021-08-10 DIAGNOSIS — M6281 Muscle weakness (generalized): Secondary | ICD-10-CM

## 2021-08-10 NOTE — Therapy (Signed)
[place] MAIN [facility] SERVICES [address] [place], [place], [address] Phone: [phone]   Fax:  [phone]  Physical Therapy Treatment  Patient Details  Name: Teresa [name] MRN: [id number] Date of Birth: [birth year] Referring Provider (PT): [name], MD   Encounter Date: 08/10/2021   PT End of Session - 08/10/21 0928     Visit Number 22    Number of Visits 25    Date for PT Re-Evaluation 08/16/21    Authorization Type [employer] PPO-    Authorization Time Period Cert 03/31/33-6/70/14    PT Start Time 0928    PT Stop Time 1014    PT Time Calculation (min) 46 min    Equipment Utilized During Treatment Gait belt    Activity Tolerance Patient tolerated treatment well    Behavior During Therapy [facility] for tasks assessed/performed             Past Medical History:  Diagnosis Date   Cancer ([place])    skin   Hypothyroidism    Raynaud's disease     Past Surgical History:  Procedure Laterality Date   [place]   BACK SURGERY  2010   Cervical fusion C4-5-6-7   CATARACT EXTRACTION, BILATERAL Bilateral 10/2016   CHOLECYSTECTOMY  1995   COLONOSCOPY WITH PROPOFOL N/A 12/08/2018   Procedure: COLONOSCOPY WITH PROPOFOL;  Surgeon: Toledo, Benay Pike, MD;  Location: [facility] ENDOSCOPY;  Service: Gastroenterology;  Laterality: N/A;   EYE SURGERY      There were no vitals filed for this visit.   Subjective Assessment - 08/10/21 0928     Subjective Pt is reporting increased pain in the R hip today.  She is hoping that it's the weather versus anything else.  Pt believes that the rain over the next couple of days is what is likely bothering it.    Pertinent History Pt is a 81 y.o. 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 Pt completed CIR from 03/02/21-03/22/21. PMH includes: skin cancer, hypothyroidism, and Raynaud's disease, history of cervical fusion in 2010 C4-C7.  Pt reports  HH PT/OT/SLP was performed from January 2nd to February 28th with focus on ADL completion, ambulation. Pt's big goal tranisitioning to ambulating up to 3 miles a day which pt was doing prior to stroke. Pt has been ambulating in [employer] with husband but last day or two pt has noticed increased L foot drag but states it is better today. Pt denies any falls at home or community. Pt reports balance deficits with turns primarily. States her balance is good with her quad cane. Wishing to return to full independence in ambulation with household and community ambulation. Wishing to be able to asc/desc full flight of stairs with ability to carry decorative items and other household items without need for handrail and/or AD.    Limitations Lifting;Standing;Walking;House hold activities    How long can you sit comfortably? unlimited    How long can you stand comfortably? unlimited    How long can you walk comfortably? ~ 1.5 hours    Patient Stated Goals Return to independent walking for 3 miles. Return to to crochet.    Pain Score 5     Pain Location Back    Pain Orientation Right;Posterior    Pain Descriptors / Indicators Aching    Pain Onset More than a month ago    Multiple Pain Sites Yes  Pain Score 5    Pain Location Shoulder    Pain Orientation Left    Pain Descriptors / Indicators Aching    Pain Type Chronic pain                INTERVENTIONS - gait belt donned and CGA provided unless otherwise specified     NMR-  SLB  x multiple bouts each LE 20-30 sec each   Airex: EO WBOS 30 sec EC NBOS 2x30-45 sec  Two airex pads: lateral stepping onto 2 airex pads and forward/backward stepping onto 2 airex pads x multiple reps of each     TherEx  Supine hamstring stretch with therapist applied overpressure, 30 sec bouts each LE Supine figure four stretch with therapist applied overpressure, 30 sec bouts each LE Supine piriformis stretch with therapist applied overpressure, 30 sec bouts each  LE Resisted crab walks with RTB around distal thigh, 28 ft, x4 each direction Seated resisted marches with RTB, 2x15 each LE    Manual  Sidelying STM to piriformis and gluteal region of the R hip for pain modulation and to be able to perform exercises with decreased pain TP release technique applied and pt educated on potential soreness 24-48 hours following.    Pt educated throughout session about proper posture and technique with exercises. Improved exercise technique, movement at target joints, use of target muscles after min to mod verbal, visual, tactile cues.        PT Short Term Goals - 08/03/21 1130       PT SHORT TERM GOAL #1   Title Pt will be indep with HEP to improve balance, strength, and gait to optimize independence with ADL completion    Baseline 05/24/21: Initiated 4/7: to be advanced; 5/12: to be advanced    Time 6    Period Weeks    Status On-going    Target Date 09/14/21               PT Long Term Goals - 08/03/21 0909       PT LONG TERM GOAL #1   Title Pt will improve FOTO to target score to 67 demonstrate clinically significant improvement in functional mobility    Baseline 05/24/21: next session 4/7: 64; 5/12: 70    Time 12    Period Weeks    Status Achieved    Target Date 08/02/21      PT LONG TERM GOAL #2   Title Pt will improve 5xSTS to 12 sec or less to indicate clinically significant improvement in LE strength    Baseline 05/24/21: 17.51 sec 4/7: 14.2 sec hands-free; 5/12: 10.3 sec hands-free    Time 12    Period Weeks    Status Achieved    Target Date 08/16/21      PT LONG TERM GOAL #3   Title Pt will improve 6 MWT by 165' to indicate clinically significant improvement in community ambulation distance for geriatric stroke norms.    Baseline 05/24/21: 765' 4/6: 842 ft with SPC; 5/12: 1060 ft without AD    Time 12    Period Weeks    Status Achieved    Target Date 08/16/21      PT LONG TERM GOAL #4   Title Pt will improve 10 m gait  speed to at least 1.0 m/s to demonstrate reduced risk of falls with community ambulation tasks.    Baseline 05/24/21: .63 m/s without AD 4/6: 0.83 without an AD; 5/12: 0.93 m/s  Time 12    Period Weeks    Status Partially Met    Target Date 08/16/21      PT LONG TERM GOAL #5   Title Pt will improve FGA  by at least 5 point to demonstrate clinically signifcant reduced risk of falls with dynamic balance/ambulation tasks.    Baseline 05/24/21: 17; 4/7: 22    Time 12    Period Weeks    Status Achieved    Target Date 08/16/21      Additional Long Term Goals   Additional Long Term Goals Yes      PT LONG TERM GOAL #6   Title Pt will improve 6 MWT by 165' to indicate clinically significant improvement in community ambulation distance for geriatric stroke norms.    Baseline 5/12: 5/12: 1060 ft without AD    Time 12    Period Weeks    Status New    Target Date 10/26/21      PT LONG TERM GOAL #7   Title Patient will tolerate 5 seconds of single leg stance bilat without loss of balance to improve ability to get in and out of shower safely.    Baseline 5/12: 3-4 sec bilat    Time 12    Period Weeks    Status New    Target Date 10/26/21                   Plan - 08/10/21 2919     Clinical Impression Statement Pt responded well to manual therapy approach and noted the pain to be decreased in her hip with walking.  Pt progressed to being able to ambulate without the cain and use of SBA when ambulating to next OT appointment.  Pt will continue to progress with skilled therapy in order to address the current deficits and relieve discomfort in hips as well.    Personal Factors and Comorbidities Age;Time since onset of injury/illness/exacerbation;Comorbidity 3+;Past/Current Experience    Examination-Activity Limitations Squat;Stairs;Locomotion Level;Stand    Examination-Participation Restrictions Shop;Community Activity;Yard Work    Conservation officer, historic buildings Evolving/Moderate  complexity    Rehab Potential Good    PT Frequency 2x / week    PT Duration 12 weeks    PT Treatment/Interventions ADLs/Self Care Home Management;Canalith Repostioning;Electrical Stimulation;DME Instruction;Gait training;Stair training;Functional mobility training;Therapeutic activities;Patient/family education;Therapeutic exercise;Balance training;Neuromuscular re-education;Energy conservation;Vestibular;Passive range of motion    PT Next Visit Plan Progress LE strength, Gait training - heel to toe sequencing, progress balance as appropriate. Continue plan    PT Home Exercise Plan Seated L ankle DF; 3/10 Access Code: [email or web address]; 5/10:  Access Code: [email or web address]; no updates    Consulted and Agree with Plan of Care Patient             Patient will benefit from skilled therapeutic intervention in order to improve the following deficits and impairments:  Abnormal gait, Pain, Decreased mobility, Decreased activity tolerance, Decreased endurance, Decreased strength, Decreased balance, Difficulty walking  Visit Diagnosis: Muscle weakness (generalized)  Other lack of coordination  Unsteadiness on feet  Difficulty in walking, not elsewhere classified     Problem List Patient Active Problem List   Diagnosis Date Noted   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 SERVICES [address] [place], [place], [address] Phone: [phone]   Fax:  [phone]  Name: Teresa [name] MRN: [id number] Date of Birth: 03/07/1941

## 2021-08-11 NOTE — Therapy (Signed)
[place] MAIN [facility] SERVICES [address] [place], [place], [address] Phone: [phone]   Fax:  [phone]  Occupational Therapy Treatment  Patient Details  Name: Teresa [name] MRN: [id number] Date of Birth: [birth year] No data recorded  Encounter Date: 08/10/2021   OT End of Session - 08/11/21 1332     Visit Number 23    Number of Visits 24    Date for OT Re-Evaluation 08/18/21    OT Start Time 1015    OT Stop Time 1100    OT Time Calculation (min) 45 min    Activity Tolerance Patient tolerated treatment well    Behavior During Therapy [facility] for tasks assessed/performed             Past Medical History:  Diagnosis Date   Cancer ([place])    skin   Hypothyroidism    Raynaud's disease     Past Surgical History:  Procedure Laterality Date   [place]   BACK SURGERY  2010   Cervical fusion C4-5-6-7   CATARACT EXTRACTION, BILATERAL Bilateral 10/2016   CHOLECYSTECTOMY  1995   COLONOSCOPY WITH PROPOFOL N/A 12/08/2018   Procedure: COLONOSCOPY WITH PROPOFOL;  Surgeon: Toledo, Benay Pike, MD;  Location: [facility] ENDOSCOPY;  Service: Gastroenterology;  Laterality: N/A;   EYE SURGERY      There were no vitals filed for this visit.   Subjective Assessment - 08/10/21 1329     Subjective  Pt continues to report improving stiffness in L hand.    Pertinent History February 26, 2021, pt reports she had a CVA, came to [facility] to ER and then was transferred to [facility] in [place] where she was admitted and after acute care she went to inpatient rehab.  Following inpt rehab, pt went home and had home health.    Patient Stated Goals Pt reports her goal is to get back to normal, be as independent as possible.    Currently in Pain? Yes    Pain Score 5     Pain Location Shoulder    Pain Orientation Left    Pain Descriptors / Indicators Aching;Sore    Pain Type Chronic pain    Pain Radiating  Towards shoulder to hand    Pain Onset More than a month ago    Pain Frequency Intermittent    Aggravating Factors  activity, raising LUE    Pain Relieving Factors rest, gentle ROM, heat    Effect of Pain on Daily Activities pt guards LUE during    Multiple Pain Sites No            Occupational Therapy Treatment: Moist heat applied to L shoulder x 15  min for pain reduction/muscle relaxation with simultaneous passive stretching to L shoulder, working to increase ROM for self care performance.   Manual Therapy: Performed scapular mobilizations in supine on mat for shoulder elevation, retraction, and upwards rotation.  Soft tissue massage to left bicep area to decrease pain and improve range of motion.    Therapeutic Exercise: Performed gentle PROM and AAROM for L shoulder flex/abd/ER/horiz abd/add to tolerance.  With 1# dowel, performed bilat gentle shoulder flexion, abd, horiz abd/add and ER with OT providing min A to guide LUE to keep ROM within pain free range.  Performed 10 each.  Performed slow, passive stretching for L wrist flex/ext and digit flex/ext, followed by active digit flex/ext and thumb flex/ext x 3 sets of 10  reps each, vc and tactile cues to maximize range in each direction.    Response to Treatment: Pt progressing well and continues to be limited by pain in left arm around bicep area when performing ROM exercises.  Pt responds well to manual therapy to decrease pain and improve ROM.  L hand edema continues to improve, though pt can not yet wear her rings on the L hand.  Pt is improving with her grasp on the L hand as noted by ability to maintain grip on dowel with BUE exercises without use of ACE wrap, occasional adjustment of grip with OT assist.  Pt remains motivated and consistent with home program.  Continue to work towards goals in plan of care to improve LUE functional use for ADL and IADL tasks.       OT Education - 08/10/21 1331     Education Details ROM,  shoulder exercises    Person(s) Educated Patient    Methods Explanation;Demonstration;Tactile cues;Verbal cues    Comprehension Verbalized understanding;Returned demonstration;Verbal cues required;Need further instruction;Tactile cues required                 OT Long Term Goals - 08/08/21 1941       OT LONG TERM GOAL #1   Title Pt will be independent with home exercise program.    Baseline Eval: no current program, 10th visit:  continue to add new exercises as pt progresses, 2oth:  continue to update HEP    Time 12    Period Weeks    Status On-going    Target Date 08/18/21      OT LONG TERM GOAL #2   Title Pt will complete UB and LB dressing with modified independence including buttons, snaps and zippers.    Baseline requires min assist at eval, 10th visit: occasional assist with buttons, 20th:  able to perform one handed, but difficulty with bilateral UE    Time 12    Period Weeks    Status On-going    Target Date 08/18/21      OT LONG TERM GOAL #3   Title Pt will perform shower transfer with modified independence.    Baseline Pt requires supervision to min assist for shower transfer at home. 10th visit: supervision    Time 6    Period Weeks    Status Achieved    Target Date 07/07/21      OT LONG TERM GOAL #4   Title Pt will improve L hand grip by 10# to assist with holding items in left hand securely.    Baseline no grip in left hand at eval, 10th visit:  improved flexion but still working towards composite fisting and grip. 20th:  continues to demo decreased grip    Time 12    Period Weeks    Status On-going    Target Date 08/18/21      OT LONG TERM GOAL #5   Title Pt will improve left shoulder flexion by 10 degrees to improve reaching to obtain self care items from shelf/shoulder height.    Baseline difficulty with reach, shoulder flexion to 47 degrees.    Time 12    Period Weeks    Status On-going    Target Date 07/07/21      OT LONG TERM GOAL #6   Title  Pt will improve FOTO score to 47 or above to demonstrate a clinically relevant change in function to impact ADL tasks.    Baseline score of 30 at eval  Time 12    Period Weeks    Status On-going    Target Date 08/18/21      OT LONG TERM GOAL #7   Title Pt will demonstrate ability to pick up small objects and complete 9 hole peg test in less than 2 mins.    Baseline unable to perform at eval, 10th visit: still unable to pick up small pegs, 20th: unable to pick up pegs but improving    Time 12    Period Weeks    Status On-going    Target Date 08/18/21              Plan - 08/10/21 1345     Clinical Impression Statement Pt progressing well and continues to be limited by pain in left arm around bicep area when performing ROM exercises.  Pt responds well to manual therapy to decrease pain and improve ROM.  L hand edema continues to improve, though pt can not yet wear her rings on the L hand.  Pt is improving with her grasp on the L hand as noted by ability to maintain grip on dowel with BUE exercises without use of ACE wrap, occasional adjustment of grip with OT assist.  Pt remains motivated and consistent with home program.  Continue to work towards goals in plan of care to improve LUE functional use for ADL and IADL tasks.    OT Occupational Profile and History Detailed Assessment- Review of Records and additional review of physical, cognitive, psychosocial history related to current functional performance    Occupational performance deficits (Please refer to evaluation for details): ADL's;IADL's;Leisure;Rest and Sleep    Body Structure / Function / Physical Skills ADL;Coordination;Endurance;GMC;UE functional use;Balance;IADL;Pain;Dexterity;FMC;Strength;Edema;Mobility;ROM    Psychosocial Skills Environmental  Adaptations;Habits;Routines and Behaviors    Rehab Potential Good    Clinical Decision Making Several treatment options, min-mod task modification necessary    Comorbidities  Affecting Occupational Performance: May have comorbidities impacting occupational performance    Modification or Assistance to Complete Evaluation  Min-Moderate modification of tasks or assist with assess necessary to complete eval    OT Frequency 2x / week    OT Duration 12 weeks    OT Treatment/Interventions Self-care/ADL training;Cryotherapy;Paraffin;Therapeutic exercise;DME and/or AE instruction;Functional Mobility Training;Balance training;Electrical Stimulation;Ultrasound;Neuromuscular education;Manual Therapy;Splinting;Moist Heat;Contrast Bath;Passive range of motion;Therapeutic activities;Patient/family education;Coping strategies training    Consulted and Agree with Plan of Care Patient             Patient will benefit from skilled therapeutic intervention in order to improve the following deficits and impairments:   Body Structure / Function / Physical Skills: ADL, Coordination, Endurance, GMC, UE functional use, Balance, IADL, Pain, Dexterity, FMC, Strength, Edema, Mobility, ROM   Psychosocial Skills: Environmental  Adaptations, Habits, Routines and Behaviors   Visit Diagnosis: Muscle weakness (generalized)  Other lack of coordination  Right middle cerebral artery stroke [facility])    Problem List Patient Active Problem List   Diagnosis Date Noted   Right middle cerebral artery stroke ([place]) 03/01/2021   Stroke ([place]) 02/27/2021   History of nonmelanoma skin cancer 05/23/2014   Leta Speller, MS, OTR/L  Darleene Cleaver, OT 08/11/2021, 1:46 PM  [place] [address] [place], [place], [address] Phone: [phone]   Fax:  [phone]  Name: Teresa [name] MRN: [id number] Date of Birth: [birth year]

## 2021-08-13 ENCOUNTER — Encounter: Payer: Medicare PPO | Admitting: Speech Pathology

## 2021-08-14 ENCOUNTER — Ambulatory Visit: Payer: Medicare PPO

## 2021-08-14 ENCOUNTER — Ambulatory Visit: Payer: Medicare PPO | Admitting: Occupational Therapy

## 2021-08-14 DIAGNOSIS — M6281 Muscle weakness (generalized): Secondary | ICD-10-CM

## 2021-08-14 DIAGNOSIS — R278 Other lack of coordination: Secondary | ICD-10-CM

## 2021-08-14 DIAGNOSIS — R262 Difficulty in walking, not elsewhere classified: Secondary | ICD-10-CM

## 2021-08-14 DIAGNOSIS — I63511 Cerebral infarction due to unspecified occlusion or stenosis of right middle cerebral artery: Secondary | ICD-10-CM

## 2021-08-14 DIAGNOSIS — R2681 Unsteadiness on feet: Secondary | ICD-10-CM

## 2021-08-14 DIAGNOSIS — M25551 Pain in right hip: Secondary | ICD-10-CM

## 2021-08-14 NOTE — Therapy (Signed)
[place] MAIN [facility] SERVICES [address] [place], [place], [address] Phone: [phone]   Fax:  [phone]  Physical Therapy Treatment  Patient Details  Name: Teresa [name] MRN: [id number] Date of Birth: Apr 14, 1940 Referring Provider (PT): [name], MD   Encounter Date: 08/14/2021   PT End of Session - 08/14/21 1506     Visit Number 23    Number of Visits 25    Date for PT Re-Evaluation 08/16/21    Authorization Type [employer] PPO-    Authorization Time Period Cert 07/28/68-1/41/03    PT Start Time 1017    PT Stop Time 1100    PT Time Calculation (min) 43 min    Equipment Utilized During Treatment Gait belt    Activity Tolerance Patient tolerated treatment well    Behavior During Therapy WFL for tasks assessed/performed             Past Medical History:  Diagnosis Date   Cancer ([place])    skin   Hypothyroidism    Raynaud's disease     Past Surgical History:  Procedure Laterality Date   [place]   BACK SURGERY  2010   Cervical fusion C4-5-6-7   CATARACT EXTRACTION, BILATERAL Bilateral 10/2016   CHOLECYSTECTOMY  1995   COLONOSCOPY WITH PROPOFOL N/A 12/08/2018   Procedure: COLONOSCOPY WITH PROPOFOL;  Surgeon: Toledo, Benay Pike, MD;  Location: [facility] ENDOSCOPY;  Service: Gastroenterology;  Laterality: N/A;   EYE SURGERY      There were no vitals filed for this visit.   Subjective Assessment - 08/14/21 1019     Subjective Pt states hip pain is "there." Pt reports no stumbles/falls.    Pertinent History Pt is a 81 y.o. 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 Pt completed CIR from 03/02/21-03/22/21. PMH includes: skin cancer, hypothyroidism, and Raynaud's disease, history of cervical fusion in 2010 C4-C7.  Pt reports HH PT/OT/SLP was performed from January 2nd to February 28th with focus on ADL completion, ambulation. Pt's big goal tranisitioning to  ambulating up to 3 miles a day which pt was doing prior to stroke. Pt has been ambulating in [employer] with husband but last day or two pt has noticed increased L foot drag but states it is better today. Pt denies any falls at home or community. Pt reports balance deficits with turns primarily. States her balance is good with her quad cane. Wishing to return to full independence in ambulation with household and community ambulation. Wishing to be able to asc/desc full flight of stairs with ability to carry decorative items and other household items without need for handrail and/or AD.    Limitations Lifting;Standing;Walking;House hold activities    How long can you sit comfortably? unlimited    How long can you stand comfortably? unlimited    How long can you walk comfortably? ~ 1.5 hours    Patient Stated Goals Return to independent walking for 3 miles. Return to to crochet.    Currently in Pain? Yes    Pain Location Hip    Pain Orientation Right    Pain Onset More than a month ago               INTERVENTIONS - gait belt donned and CGA provided unless otherwise specified   NMR Ambulating 2x148 ft cuing for heel-toe sequencing, upright posture  -added reading sticky notes 2x length of long hallway  to incorporate head turns for additional balance challenge  Ambulating for increased speed over 10 meters, cuing to maintain correct sequencing- Trial 1- 0.71 m/s Trial 2- 0.90 m/s Trial 3- 0.93 m/s  SLB  x 2x30 sec bilat.. Some pain on RLE due to glute pain (chronic). Pt with rest interval and heat donned to R hip following intervention.   Airex: EO WBOS 30 sec EC NBOS 4x30 sec, requires up to min assist  Hurdle forward/backward stepping to promote SLB and foot clearance x multiple reps Hurdle laterally to promote SLB and foot clearance x multiple reps Comments: Cuing to avoid circumduction when stepping forward/backward  STS with bilat LE on airex 4x. Discontinued due to R hip/glute  pain, otherwise no LOB/unsteadiness.   TRX squat to 1 ft block with airex pad on it 4x. Discontinued due to R glute/hip pain. Held TRX in RUE only.   Pt with recovery interval while heat donned to R hip /glute.  Tandem walking over 10 meters 2x- up to min assist required due to unsteadiness.  Attempted figure four stretch (PT assisted pt into position) on RLE x 30 sec. Pt reports feeling discomfort mostly in R groin region/hip flexor more than R glute. Pt reports feeling pain is not deep but "closer to the surface"  Describes as a nag and a pull.    Pt educated throughout session about proper posture and technique with exercises. Improved exercise technique, movement at target joints, use of target muscles after min to mod verbal, visual, tactile cues.   PT Education - 08/14/21 1506     Education Details exercise technique, body mechanics    Person(s) Educated Patient    Methods Explanation;Demonstration;Verbal cues    Comprehension Verbalized understanding;Returned demonstration;Verbal cues required;Need further instruction              PT Short Term Goals - 08/03/21 1130       PT SHORT TERM GOAL #1   Title Pt will be indep with HEP to improve balance, strength, and gait to optimize independence with ADL completion    Baseline 05/24/21: Initiated 4/7: to be advanced; 5/12: to be advanced    Time 6    Period Weeks    Status On-going    Target Date 09/14/21               PT Long Term Goals - 08/03/21 0909       PT LONG TERM GOAL #1   Title Pt will improve FOTO to target score to 67 demonstrate clinically significant improvement in functional mobility    Baseline 05/24/21: next session 4/7: 64; 5/12: 70    Time 12    Period Weeks    Status Achieved    Target Date 08/02/21      PT LONG TERM GOAL #2   Title Pt will improve 5xSTS to 12 sec or less to indicate clinically significant improvement in LE strength    Baseline 05/24/21: 17.51 sec 4/7: 14.2 sec hands-free; 5/12:  10.3 sec hands-free    Time 12    Period Weeks    Status Achieved    Target Date 08/16/21      PT LONG TERM GOAL #3   Title Pt will improve 6 MWT by 165' to indicate clinically significant improvement in community ambulation distance for geriatric stroke norms.    Baseline 05/24/21: 765' 4/6: 842 ft with SPC; 5/12: 1060 ft without AD    Time 12    Period Weeks  Status Achieved    Target Date 08/16/21      PT LONG TERM GOAL #4   Title Pt will improve 10 m gait speed to at least 1.0 m/s to demonstrate reduced risk of falls with community ambulation tasks.    Baseline 05/24/21: .63 m/s without AD 4/6: 0.83 without an AD; 5/12: 0.93 m/s    Time 12    Period Weeks    Status Partially Met    Target Date 08/16/21      PT LONG TERM GOAL #5   Title Pt will improve FGA  by at least 5 point to demonstrate clinically signifcant reduced risk of falls with dynamic balance/ambulation tasks.    Baseline 05/24/21: 17; 4/7: 22    Time 12    Period Weeks    Status Achieved    Target Date 08/16/21      Additional Long Term Goals   Additional Long Term Goals Yes      PT LONG TERM GOAL #6   Title Pt will improve 6 MWT by 165' to indicate clinically significant improvement in community ambulation distance for geriatric stroke norms.    Baseline 5/12: 5/12: 1060 ft without AD    Time 12    Period Weeks    Status New    Target Date 10/26/21      PT LONG TERM GOAL #7   Title Patient will tolerate 5 seconds of single leg stance bilat without loss of balance to improve ability to get in and out of shower safely.    Baseline 5/12: 3-4 sec bilat    Time 12    Period Weeks    Status New    Target Date 10/26/21                   Plan - 08/14/21 1507     Clinical Impression Statement Focus of session on gait mechanics, foot clearance and SLB. Pt's SLB mostly limited due to R glute/groin/hip pain that improves with rest and worsens with specific positions. Figure four stretch, in addition to  SLB intervention and STS brought on these symptoms. Discussed with pt follow-up with PCP if pain continues as she has had this for a few weeks, may benefit from PT to focus specifically on this pain. Will continue to monitor. The pt will benefit from further skilled PT to improve gait, balance and mobility to decrease fall risk.    Personal Factors and Comorbidities Age;Time since onset of injury/illness/exacerbation;Comorbidity 3+;Past/Current Experience    Examination-Activity Limitations Squat;Stairs;Locomotion Level;Stand    Examination-Participation Restrictions Shop;Community Activity;Yard Work    Conservation officer, historic buildings Evolving/Moderate complexity    Rehab Potential Good    PT Frequency 2x / week    PT Duration 12 weeks    PT Treatment/Interventions ADLs/Self Care Home Management;Canalith Repostioning;Electrical Stimulation;DME Instruction;Gait training;Stair training;Functional mobility training;Therapeutic activities;Patient/family education;Therapeutic exercise;Balance training;Neuromuscular re-education;Energy conservation;Vestibular;Passive range of motion    PT Next Visit Plan Progress LE strength, Gait training - heel to toe sequencing, progress balance as appropriate. Continue plan    PT Home Exercise Plan Seated L ankle DF; 3/10 Access Code: [email or web address]; 5/10:  Access Code: [email or web address]; no updates    Consulted and Agree with Plan of Care Patient             Patient will benefit from skilled therapeutic intervention in order to improve the following deficits and impairments:  Abnormal gait, Pain, Decreased mobility, Decreased activity tolerance, Decreased endurance, Decreased strength, Decreased balance, Difficulty walking  Visit Diagnosis: Unsteadiness on feet  Muscle weakness (generalized)  Pain in right hip  Difficulty in walking, not elsewhere classified     Problem List Patient Active Problem List   Diagnosis Date Noted   Right middle cerebral artery  stroke ([place]) 03/01/2021   Stroke ([place]) 02/27/2021   History of nonmelanoma skin cancer 05/23/2014    Zollie Pee, PT 08/14/2021, 3:15 PM  [facility] [address] [place], [place], [address] Phone: [phone]   Fax:  [phone]  Name: Teresa [name] MRN: [id number] Date of Birth: January 13, 1941

## 2021-08-16 ENCOUNTER — Encounter: Payer: Self-pay | Admitting: Occupational Therapy

## 2021-08-16 NOTE — Therapy (Addendum)
[place] MAIN [facility] SERVICES [address] [place], [place], [address] Phone: [phone]   Fax:  [phone]  Occupational Therapy Treatment  Patient Details  Name: Teresa [name] MRN: [id number] Date of Birth: 01-Jan-1941 No data recorded  Encounter Date: 08/14/2021   OT End of Session - 08/16/21 2040     Visit Number 24    Number of Visits 24    Date for OT Re-Evaluation 08/18/21    OT Start Time 1100    OT Stop Time 1145    OT Time Calculation (min) 45 min    Activity Tolerance Patient tolerated treatment well    Behavior During Therapy [facility] for tasks assessed/performed             Past Medical History:  Diagnosis Date   Cancer ([place])    skin   Hypothyroidism    Raynaud's disease     Past Surgical History:  Procedure Laterality Date   [place]   BACK SURGERY  2010   Cervical fusion C4-5-6-7   CATARACT EXTRACTION, BILATERAL Bilateral 10/2016   CHOLECYSTECTOMY  1995   COLONOSCOPY WITH PROPOFOL N/A 12/08/2018   Procedure: COLONOSCOPY WITH PROPOFOL;  Surgeon: Toledo, Benay Pike, MD;  Location: [facility] ENDOSCOPY;  Service: Gastroenterology;  Laterality: N/A;   EYE SURGERY      There were no vitals filed for this visit.   Subjective Assessment - 08/16/21 2038     Subjective  Pt reports Right hip pain, 3/10.  Left shoulder pain with movement, 2/10.  Wearing compression glove but reports she could do a composite fist yesterday.    Pertinent History February 26, 2021, pt reports she had a CVA, came to [facility] to ER and then was transferred to [facility] in [place] where she was admitted and after acute care she went to inpatient rehab.  Following inpt rehab, pt went home and had home health.    Patient Stated Goals Pt reports her goal is to get back to normal, be as independent as possible.    Currently in Pain? Yes    Pain Score 2     Pain Location Hand    Pain Orientation Left     Pain Descriptors / Indicators Aching;Sore    Pain Type Chronic pain    Pain Onset More than a month ago    Pain Frequency Intermittent    Multiple Pain Sites Yes    Pain Score 3    Pain Location Hip    Pain Orientation Right    Pain Descriptors / Indicators Aching    Pain Type Chronic pain    Pain Onset In the past 7 days    Pain Frequency Intermittent            Therapeutic Exercise:  Pt seen for ROM exercises in sitting, AAROM with therapist assist to perform shoulder flexion on left, ABD, elbow flexion/extension,  Supination/pronation with holding a larger red grip pen for visual feedback for end ranges as well as working on gripping through the range.  Wrist flex/ext over arm rest and holding pen, 10 reps then 10 with AAROM towards end extension, some mild pain noted at end range.   Neuromuscular Reeducation:  Pt seen for grasping of jumbo pegs from bucket, able to pick up and place in to grid today which she couldn't do in the past, increased effort and lacks precision for task but grossly able to complete 5  reps x 2 sets Pt instructed on picking pegs up from table when placed in vertical position, then progressed to horizontal position.   Cues for finger extension with release as well as wrist extension and shoulder flexion.    Response to tx: Pt progressing with active shoulder motion, able to demonstrate shoulder flexion to 110 degrees in sitting today.  She reports full fisting at times at home after working her hand and when edema is decreased.  Pt improving with grasping and manipulation skills but still lacks coordination and precision in movements.  Will need to perform re-certification next session with goal update and measurements.       Rationale for Evaluation and Treatment Rehabilitation                  OT Education - 08/16/21 2040     Education Details ROM, shoulder exercises    Person(s) Educated Patient    Methods  Explanation;Demonstration;Tactile cues;Verbal cues    Comprehension Verbalized understanding;Returned demonstration;Verbal cues required;Need further instruction;Tactile cues required                 OT Long Term Goals - 08/08/21 1941       OT LONG TERM GOAL #1   Title Pt will be independent with home exercise program.    Baseline Eval: no current program, 10th visit:  continue to add new exercises as pt progresses, 2oth:  continue to update HEP    Time 12    Period Weeks    Status On-going    Target Date 08/18/21      OT LONG TERM GOAL #2   Title Pt will complete UB and LB dressing with modified independence including buttons, snaps and zippers.    Baseline requires min assist at eval, 10th visit: occasional assist with buttons, 20th:  able to perform one handed, but difficulty with bilateral UE    Time 12    Period Weeks    Status On-going    Target Date 08/18/21      OT LONG TERM GOAL #3   Title Pt will perform shower transfer with modified independence.    Baseline Pt requires supervision to min assist for shower transfer at home. 10th visit: supervision    Time 6    Period Weeks    Status Achieved    Target Date 07/07/21      OT LONG TERM GOAL #4   Title Pt will improve L hand grip by 10# to assist with holding items in left hand securely.    Baseline no grip in left hand at eval, 10th visit:  improved flexion but still working towards composite fisting and grip. 20th:  continues to demo decreased grip    Time 12    Period Weeks    Status On-going    Target Date 08/18/21      OT LONG TERM GOAL #5   Title Pt will improve left shoulder flexion by 10 degrees to improve reaching to obtain self care items from shelf/shoulder height.    Baseline difficulty with reach, shoulder flexion to 47 degrees.    Time 12    Period Weeks    Status On-going    Target Date 07/07/21      OT LONG TERM GOAL #6   Title Pt will improve FOTO score to 47 or above to demonstrate a  clinically relevant change in function to impact ADL tasks.    Baseline score of 30 at eval  Time 12    Period Weeks    Status On-going    Target Date 08/18/21      OT LONG TERM GOAL #7   Title Pt will demonstrate ability to pick up small objects and complete 9 hole peg test in less than 2 mins.    Baseline unable to perform at eval, 10th visit: still unable to pick up small pegs, 20th: unable to pick up pegs but improving    Time 12    Period Weeks    Status On-going    Target Date 08/18/21                   Plan - 08/16/21 2041     Clinical Impression Statement Pt progressing with active shoulder motion, able to demonstrate shoulder flexion to 110 degrees in sitting today.  She reports full fisting at times at home after working her hand and when edema is decreased.  Pt improving with grasping and manipulation skills but still lacks coordination and precision in movements.  Will need to perform re-certification next session with goal update and measurements.    OT Occupational Profile and History Detailed Assessment- Review of Records and additional review of physical, cognitive, psychosocial history related to current functional performance    Occupational performance deficits (Please refer to evaluation for details): ADL's;IADL's;Leisure;Rest and Sleep    Body Structure / Function / Physical Skills ADL;Coordination;Endurance;GMC;UE functional use;Balance;IADL;Pain;Dexterity;FMC;Strength;Edema;Mobility;ROM    Psychosocial Skills Environmental  Adaptations;Habits;Routines and Behaviors    Rehab Potential Good    Clinical Decision Making Several treatment options, min-mod task modification necessary    Comorbidities Affecting Occupational Performance: May have comorbidities impacting occupational performance    Modification or Assistance to Complete Evaluation  Min-Moderate modification of tasks or assist with assess necessary to complete eval    OT Frequency 2x / week    OT  Duration 12 weeks    OT Treatment/Interventions Self-care/ADL training;Cryotherapy;Paraffin;Therapeutic exercise;DME and/or AE instruction;Functional Mobility Training;Balance training;Electrical Stimulation;Ultrasound;Neuromuscular education;Manual Therapy;Splinting;Moist Heat;Contrast Bath;Passive range of motion;Therapeutic activities;Patient/family education;Coping strategies training    Consulted and Agree with Plan of Care Patient             Patient will benefit from skilled therapeutic intervention in order to improve the following deficits and impairments:   Body Structure / Function / Physical Skills: ADL, Coordination, Endurance, GMC, UE functional use, Balance, IADL, Pain, Dexterity, FMC, Strength, Edema, Mobility, ROM   Psychosocial Skills: Environmental  Adaptations, Habits, Routines and Behaviors   Visit Diagnosis: Muscle weakness (generalized)  Other lack of coordination  Right middle cerebral artery stroke [facility])    Problem List Patient Active Problem List   Diagnosis Date Noted   Right middle cerebral artery stroke ([place]) 03/01/2021   Stroke ([place]) 02/27/2021   History of nonmelanoma skin cancer 05/23/2014   Trissa Molina Oneita Jolly, OTR/L, CLT  Dearra Myhand, OT 08/16/2021, 9:44 PM  [facility] [address] [place], [place], [address] Phone: [phone]   Fax:  [phone]  Name: Teresa [name] MRN: [id number] Date of Birth: August 25, 1940

## 2021-08-17 ENCOUNTER — Ambulatory Visit: Payer: Medicare PPO

## 2021-08-17 ENCOUNTER — Encounter: Payer: Medicare PPO | Admitting: Speech Pathology

## 2021-08-17 DIAGNOSIS — R482 Apraxia: Secondary | ICD-10-CM

## 2021-08-17 DIAGNOSIS — M25551 Pain in right hip: Secondary | ICD-10-CM

## 2021-08-17 DIAGNOSIS — R2681 Unsteadiness on feet: Secondary | ICD-10-CM | POA: Diagnosis not present

## 2021-08-17 DIAGNOSIS — R262 Difficulty in walking, not elsewhere classified: Secondary | ICD-10-CM

## 2021-08-17 DIAGNOSIS — M6281 Muscle weakness (generalized): Secondary | ICD-10-CM

## 2021-08-17 DIAGNOSIS — R278 Other lack of coordination: Secondary | ICD-10-CM

## 2021-08-17 NOTE — Therapy (Signed)
[place] MAIN [facility] SERVICES [address] [place], [place], [address] Phone: [phone]   Fax:  [phone]  Physical Therapy Treatment/RECERT Patient Details  Name: Teresa [name] MRN: [id number] Date of Birth: 1940-07-31 Referring Provider (PT): [name], MD   Encounter Date: 08/17/2021   PT End of Session - 08/17/21 1058     Visit Number 24    Number of Visits 40    Date for PT Re-Evaluation 10/12/21    Authorization Type [employer] PPO-    Authorization Time Period cert 09/15/7626-06/07/1759    PT Start Time 1006    PT Stop Time 1051    PT Time Calculation (min) 45 min    Equipment Utilized During Treatment Gait belt    Activity Tolerance Patient tolerated treatment well    Behavior During Therapy [facility] for tasks assessed/performed             Past Medical History:  Diagnosis Date   Cancer ([place])    skin   Hypothyroidism    Raynaud's disease     Past Surgical History:  Procedure Laterality Date   [place]   BACK SURGERY  2010   Cervical fusion C4-5-6-7   CATARACT EXTRACTION, BILATERAL Bilateral 10/2016   CHOLECYSTECTOMY  1995   COLONOSCOPY WITH PROPOFOL N/A 12/08/2018   Procedure: COLONOSCOPY WITH PROPOFOL;  Surgeon: Toledo, Benay Pike, MD;  Location: [facility] ENDOSCOPY;  Service: Gastroenterology;  Laterality: N/A;   EYE SURGERY      There were no vitals filed for this visit.   Subjective Assessment - 08/17/21 1005     Subjective Pt reports her hip feels fine today. She reports no stumbles/falls.    Pertinent History Pt is a 81 y.o. 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 Pt completed CIR from 03/02/21-03/22/21. PMH includes: skin cancer, hypothyroidism, and Raynaud's disease, history of cervical fusion in 2010 C4-C7.  Pt reports HH PT/OT/SLP was performed from January 2nd to February 28th with focus on ADL completion, ambulation. Pt's big goal  tranisitioning to ambulating up to 3 miles a day which pt was doing prior to stroke. Pt has been ambulating in [employer] with husband but last day or two pt has noticed increased L foot drag but states it is better today. Pt denies any falls at home or community. Pt reports balance deficits with turns primarily. States her balance is good with her quad cane. Wishing to return to full independence in ambulation with household and community ambulation. Wishing to be able to asc/desc full flight of stairs with ability to carry decorative items and other household items without need for handrail and/or AD.    Limitations Lifting;Standing;Walking;House hold activities    How long can you sit comfortably? unlimited    How long can you stand comfortably? unlimited    How long can you walk comfortably? ~ 1.5 hours    Patient Stated Goals Return to independent walking for 3 miles. Return to to crochet.    Currently in Pain? No/denies    Pain Onset More than a month ago    Pain Onset In the past 7 days              Interventions- Goals reassessed for progress note. PT instructs pt in technique with all goals assessed.   FOTO: 72%  10MWT: 1.16 m/s no AD - achieved 2x warm-up trial  SLB: L 21 sec R  11 sec  6MWT: 1178. Pt reports onset of mild R hip pain with last couple laps.  Heat donned to R hip for brief rest breaks throughout. No adverse reaction to heat. Pt reports improves R hip symptoms that were felt with 6MWT.  Ambulating around cones to promote turning, walking in and out of dimly lit rooms, and picking up cones from floor. 2 instances of mild unsteadiness with ambulating around cones.  360 turns against wall 4x. Discontinued due to reports of nausea. This completely resolves with rest.   Instructed pt in rolling technique bilaterally on plinth for improved bed mobility 2x through. TC/demo/VC provided.    Pt educated throughout session about proper posture and technique with  exercises. Improved exercise technique, movement at target joints, use of target muscles after min to mod verbal, visual, tactile cues.     PT Short Term Goals - 08/03/21 1130       PT SHORT TERM GOAL #1   Title Pt will be indep with HEP to improve balance, strength, and gait to optimize independence with ADL completion    Baseline 05/24/21: Initiated 4/7: to be advanced; 5/12: to be advanced    Time 6    Period Weeks    Status On-going    Target Date 09/14/21               PT Long Term Goals - 08/17/21 1010       PT LONG TERM GOAL #1   Title Pt will improve FOTO to target score to 67 demonstrate clinically significant improvement in functional mobility    Baseline 05/24/21: next session 4/7: 64; 5/12: 70; 5/26: 72%    Time 12    Period Weeks    Status Achieved    Target Date 08/02/21      PT LONG TERM GOAL #2   Title Pt will improve 5xSTS to 12 sec or less to indicate clinically significant improvement in LE strength    Baseline 05/24/21: 17.51 sec 4/7: 14.2 sec hands-free; 5/12: 10.3 sec hands-free    Time 12    Period Weeks    Status Achieved    Target Date 08/16/21      PT LONG TERM GOAL #3   Title Pt will improve 6 MWT by 165' to indicate clinically significant improvement in community ambulation distance for geriatric stroke norms.    Baseline 05/24/21: 765' 4/6: 842 ft with SPC; 5/12: 1060 ft without AD    Time 12    Period Weeks    Status Achieved    Target Date 08/16/21      PT LONG TERM GOAL #4   Title Pt will improve 10 m gait speed to at least 1.0 m/s to demonstrate reduced risk of falls with community ambulation tasks.    Baseline 05/24/21: .63 m/s without AD 4/6: 0.83 without an AD; 5/12: 0.93 m/s; 5/26: 1.16 m/s    Time 12    Period Weeks    Status Achieved    Target Date 08/16/21      PT LONG TERM GOAL #5   Title Pt will improve FGA  by at least 5 point to demonstrate clinically signifcant reduced risk of falls with dynamic balance/ambulation tasks.     Baseline 05/24/21: 17; 4/7: 22    Time 12    Period Weeks    Status Achieved    Target Date 08/16/21      Additional Long Term Goals   Additional Long Term Goals Yes  PT LONG TERM GOAL #6   Title Pt will improve 6 MWT by 165' to indicate clinically significant improvement in community ambulation distance for geriatric stroke norms.    Baseline 5/12: 5/12: 1060 ft without AD; 5/26: 1178 ft no AD    Time 8    Period Weeks    Status Partially Met    Target Date 10/12/21      PT LONG TERM GOAL #7   Title Patient will tolerate 5 seconds of single leg stance bilat without loss of balance to improve ability to get in and out of shower safely.    Baseline 5/12: 3-4 sec bilat; 5/26: L 21 seconds, R 11 seconds    Time 12    Period Weeks    Status Achieved    Target Date 10/26/21      PT LONG TERM GOAL #8   Title Pt will report a 50% improvement in steadiness with turning to walk into and out of rooms in her home in order to reduce fall risk.    Baseline 5/26: pt reports unsteadiness 75% of the time    Time 8    Period Weeks    Status New    Target Date 10/12/21                   Plan - 08/17/21 1059     Clinical Impression Statement Goals reassessed for recert. Pt has met all but one remaining therapy goal, and has partially met remaining 6MWT goal. 6MWT likely impacted by onset of R hip pain that resolved with rest and use of heat. These improvements indicate improved perceived functional mobility, QOL, gait speed/ability and balance. Pt reports remaining deficits include unsteadiness/stumbling with turning into and out of rooms in her home, and difficulty with bed mobility. PT has added balance goal to address remaining deficits (see goal section for details). Patient's condition has the potential to improve in response to therapy. Maximum improvement is yet to be obtained. The anticipated improvement is attainable and reasonable in a generally predictable time. The pt will  benefit from further skilled PT to continue to improve balance, mobility, strength and gait to increase ease and safety wtih ADLs.    Personal Factors and Comorbidities Age;Time since onset of injury/illness/exacerbation;Comorbidity 3+;Past/Current Experience    Examination-Activity Limitations Squat;Stairs;Locomotion Level;Stand    Examination-Participation Restrictions Shop;Community Activity;Yard Work    [employer] Evolving/Moderate complexity    Rehab Potential Good    PT Frequency 2x / week    PT Duration 12 weeks    PT Treatment/Interventions ADLs/Self Care Home Management;Canalith Repostioning;Electrical Stimulation;DME Instruction;Gait training;Stair training;Functional mobility training;Therapeutic activities;Patient/family education;Therapeutic exercise;Balance training;Neuromuscular re-education;Energy conservation;Vestibular;Passive range of motion    PT Next Visit Plan Progress LE strength, Gait training - heel to toe sequencing, progress balance as appropriate. Continue plan    PT Home Exercise Plan Seated L ankle DF; 3/10 Access Code: [email or web address]; 5/10:  Access Code: [email or web address]; no updates    Consulted and Agree with Plan of Care Patient             Patient will benefit from skilled therapeutic intervention in order to improve the following deficits and impairments:  Abnormal gait, Pain, Decreased mobility, Decreased activity tolerance, Decreased endurance, Decreased strength, Decreased balance, Difficulty walking  Visit Diagnosis: Unsteadiness on feet  Difficulty in walking, not elsewhere classified  Muscle weakness (generalized)  Pain in right hip  Other lack of coordination     Problem List Patient Active Problem List  Diagnosis Date Noted   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

## 2021-08-17 NOTE — Therapy (Signed)
[place] MAIN [facility] SERVICES [address] [place], [place], [address] Phone: [phone]   Fax:  [phone]  Occupational Therapy Recertification  Patient Details  Name: Teresa [name] MRN: [id number] Date of Birth: Sep 08, 1940 No data recorded  Encounter Date: 08/17/2021   OT End of Session - 08/17/21 1841     Visit Number 25    Number of Visits 48    Date for OT Re-Evaluation 11/08/21    OT Start Time 0915    OT Stop Time 1005    OT Time Calculation (min) 50 min    Activity Tolerance Patient tolerated treatment well    Behavior During Therapy [facility] for tasks assessed/performed             Past Medical History:  Diagnosis Date   Cancer ([place])    skin   Hypothyroidism    Raynaud's disease     Past Surgical History:  Procedure Laterality Date   [place]   BACK SURGERY  2010   Cervical fusion C4-5-6-7   CATARACT EXTRACTION, BILATERAL Bilateral 10/2016   CHOLECYSTECTOMY  1995   COLONOSCOPY WITH PROPOFOL N/A 12/08/2018   Procedure: COLONOSCOPY WITH PROPOFOL;  Surgeon: Toledo, Benay Pike, MD;  Location: [facility] ENDOSCOPY;  Service: Gastroenterology;  Laterality: N/A;   EYE SURGERY      There were no vitals filed for this visit.   Subjective Assessment - 08/17/21 1836     Subjective  Pt reports that she continues to stretch her hand daily and wear her compression glove.    Pertinent History February 26, 2021, pt reports she had a CVA, came to [facility] to ER and then was transferred to [facility] in [place] where she was admitted and after acute care she went to inpatient rehab.  Following inpt rehab, pt went home and had home health.    Patient Stated Goals Pt reports her goal is to get back to normal, be as independent as possible.    Currently in Pain? Yes    Pain Score 2     Pain Location Arm    Pain Orientation Left;Proximal;Distal    Pain Descriptors / Indicators  Aching;Sore;Tightness    Pain Type Chronic pain    Pain Radiating Towards shoulder to hand    Pain Onset More than a month ago    Pain Frequency Intermittent    Aggravating Factors  end range stretching of digits, raising LUE to ~90 degrees    Pain Relieving Factors rest, gentle ROM, heat    Effect of Pain on Daily Activities limited use of LUE for self care    Multiple Pain Sites Yes    Pain Score 3    Pain Location Hip    Pain Orientation Right    Pain Descriptors / Indicators Aching    Pain Type Chronic pain    Pain Onset In the past 7 days    Pain Frequency Intermittent                [facility] OT Assessment - 08/17/21 0001       Assessment   Medical Diagnosis R sided MCA CVA    Onset Date/Surgical Date 02/26/21    Hand Dominance Right      Observation/Other Assessments   Focus on Therapeutic Outcomes (FOTO)  42 (30 at initial eval)      AROM   Overall AROM Comments L shoulder flex 85 flex, abd 57,  elbow flex 135, ext -10, wrist flex 56, ext 30      Left Hand AROM   L Index  MCP 0-90 65 Degrees   ext -20   L Index PIP 0-100 70 Degrees   ext -20   L Long  MCP 0-90 75 Degrees   ext -10   L Long PIP 0-100 75 Degrees   ext -15   L Ring  MCP 0-90 70 Degrees   ext -10   L Ring PIP 0-100 80 Degrees   ext -30   L Little  MCP 0-90 50 Degrees   full ext   L Little PIP 0-100 75 Degrees   ext -30     Hand Function   Right Hand Grip (lbs) 48    Right Hand Lateral Pinch 12 lbs    Right Hand 3 Point Pinch 16 lbs    Left Hand Grip (lbs) 0    Left Hand Lateral Pinch 3 lbs    Left 3 point pinch 1 lbs            Occupational Therapy Treatment/Recertification: Therapeutic Exercise: Performed passive L wrist flex/ext, digit flex/ext all digits, L manual scapular mobility all planes, and AAROM for L shoulder flex/abd/horiz abd/add, working to increase joint flexibility for increased functional use of LUE. Objective measures taken, goals updated/revised as needed.  HEP  reviewed.  Pt indep with current program which includes AROM/PROM for L wrist and hand, AROM for L forearm and elbow, and AROM/AAROM for L shoulder.    Response to Treatment: Pt is making steady gains towards all OT goals.  FOTO score has improved from 30 to 42.  Pt can used her arm for gross movements with some difficulty, ie pushing a door closed, lifting her arm to wash or thread arm through a shirt sleeve.  L hand digit flex/ext measurements continues to improve.  Pt is able to make a composite fist after several min of passive stretching.  Pt was able to register 3# of L lateral pinch and 1# of 3 point pinch this date, previously 0.  Pt continues to make gains with L active shoulder flexion, but stiffness and pain set in at ~90 degrees.  Pt continues to wear her compression glove daily, as well as perform self edema massage techniques.  Edema in L hand has improved but pt is not yet able to wear her rings on the L hand.  Pt remains very motivated and compliant with HEP.  Pt will continue to benefit from skilled OT to work towards goals in plan of care to improve LUE functional use for ADL and IADL tasks   OT Education - 08/17/21 1840     Education Details progress towards goals, HEP review, UE dressing strategies    Person(s) Educated Patient    Methods Explanation;Demonstration;Tactile cues;Verbal cues    Comprehension Verbalized understanding;Returned demonstration;Verbal cues required;Need further instruction;Tactile cues required                 OT Long Term Goals - 08/17/21 0925       OT LONG TERM GOAL #1   Title Pt will be independent with home exercise program.    Baseline Eval: no current program, 10th visit:  continue to add new exercises as pt progresses, 20th:  continue to update HEP; 08/17/21: continue to progress HEP when indicated    Time 12    Period Weeks    Status On-going    Target Date 11/08/21  OT LONG TERM GOAL #2   Title Pt will complete UB and LB  dressing with modified independence including buttons, snaps and zippers.    Baseline requires min assist at eval, 10th visit: occasional assist with buttons, 20th:  able to perform one handed, but difficulty with bilateral UE; 08/17/21: pt reports inconsistent with 1 hand, reviewed techniques this visit    Time 12    Period Weeks    Status On-going    Target Date 11/08/21      OT LONG TERM GOAL #3   Title Pt will perform shower transfer with modified independence.    Baseline Pt requires supervision to min assist for shower transfer at home. 10th visit: supervision; 08/17/21: supv    Time 6    Period Weeks    Status Partially Met    Target Date 09/27/21      OT LONG TERM GOAL #4   Title Pt will improve L hand grip by 10# to assist with holding items in left hand securely.    Baseline no grip in left hand at eval, 10th visit:  improved flexion but still working towards composite fisting and grip. 20th:  continues to demo decreased grip; 08/17/21: active digit flexion improving, but not yet able to register grip on dynamometer    Time 12    Period Weeks    Status On-going    Target Date 11/08/21      OT LONG TERM GOAL #5   Title Pt will improve left shoulder flexion to 100 degrees or better to improve reaching to obtain self care items from shelf/shoulder height.    Baseline difficulty with reach, shoulder flexion to 47 degrees; 08/17/21: L shoulder flexion 85, but not yet able to consistently hold ADL supplies in L hand when reaching    Time 12    Period Weeks    Status Revised    Target Date 11/08/21      OT LONG TERM GOAL #6   Title Pt will improve FOTO score to 47 or above to demonstrate a clinically relevant change in function to impact ADL tasks.    Baseline score of 30 at eval; 08/17/21: FOTO: 42    Time 12    Period Weeks    Status On-going    Target Date 11/08/21      OT LONG TERM GOAL #7   Title Pt will demonstrate ability to pick up small objects and complete 9 hole peg  test in less than 2 mins.    Baseline unable to perform at eval, 10th visit: still unable to pick up small pegs, 20th: unable to pick up pegs but improving; 08/17/21: not attempted d/t time constraints, will assess next visit    Time 12    Period Weeks    Status On-going    Target Date 11/08/21              Plan - 08/17/21 1912     Clinical Impression Statement Pt is making steady gains towards all OT goals.  FOTO score has improved from 30 to 42.  Pt can used her arm for gross movements with some difficulty, ie pushing a door closed, lifting her arm to wash or thread arm through a shirt sleeve.  L hand digit flex/ext measurements continues to improve.  Pt is able to make a composite fist after several min of passive stretching.  Pt was able to register 3# of L lateral pinch and 1# of 3 point pinch this  date, previously 0.  Pt continues to make gains with L active shoulder flexion, but stiffness and pain set in at ~90 degrees.  Pt continues to wear her compression glove daily, as well as perform self edema massage techniques.  Edema in L hand has improved but pt is not yet able to wear her rings on the L hand.  Pt remains very motivated and compliant with HEP.  Pt will continue to benefit from skilled OT to work towards goals in plan of care to improve LUE functional use for ADL and IADL tasks    OT Occupational Profile and History Detailed Assessment- Review of Records and additional review of physical, cognitive, psychosocial history related to current functional performance    Occupational performance deficits (Please refer to evaluation for details): ADL's;IADL's;Leisure;Rest and Sleep    Body Structure / Function / Physical Skills ADL;Coordination;Endurance;GMC;UE functional use;Balance;IADL;Pain;Dexterity;FMC;Strength;Edema;Mobility;ROM    Psychosocial Skills Environmental  Adaptations;Habits;Routines and Behaviors    Rehab Potential Good    Clinical Decision Making Several treatment  options, min-mod task modification necessary    Comorbidities Affecting Occupational Performance: May have comorbidities impacting occupational performance    Modification or Assistance to Complete Evaluation  Min-Moderate modification of tasks or assist with assess necessary to complete eval    OT Frequency 2x / week    OT Duration 12 weeks    OT Treatment/Interventions Self-care/ADL training;Cryotherapy;Paraffin;Therapeutic exercise;DME and/or AE instruction;Functional Mobility Training;Balance training;Electrical Stimulation;Ultrasound;Neuromuscular education;Manual Therapy;Splinting;Moist Heat;Contrast Bath;Passive range of motion;Therapeutic activities;Patient/family education;Coping strategies training    Plan OT recert    Consulted and Agree with Plan of Care Patient             Patient will benefit from skilled therapeutic intervention in order to improve the following deficits and impairments:   Body Structure / Function / Physical Skills: ADL, Coordination, Endurance, GMC, UE functional use, Balance, IADL, Pain, Dexterity, FMC, Strength, Edema, Mobility, ROM   Psychosocial Skills: Environmental  Adaptations, Habits, Routines and Behaviors   Visit Diagnosis: Apraxia  Muscle weakness (generalized)  Other lack of coordination    Problem List Patient Active Problem List   Diagnosis Date Noted   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 [place], [place], [address] Phone: [phone]   Fax:  [phone]  Name: Teresa [name] MRN: [id number] Date of Birth: 24-Mar-1941

## 2021-08-21 ENCOUNTER — Ambulatory Visit: Payer: Medicare PPO | Admitting: Adult Health

## 2021-08-21 ENCOUNTER — Ambulatory Visit (INDEPENDENT_AMBULATORY_CARE_PROVIDER_SITE_OTHER): Payer: Medicare PPO

## 2021-08-21 ENCOUNTER — Encounter: Payer: Self-pay | Admitting: Adult Health

## 2021-08-21 ENCOUNTER — Encounter: Payer: Medicare PPO | Admitting: Speech Pathology

## 2021-08-21 VITALS — BP 138/79 | HR 77 | Ht 62.0 in | Wt 144.0 lb

## 2021-08-21 DIAGNOSIS — G8114 Spastic hemiplegia affecting left nondominant side: Secondary | ICD-10-CM | POA: Diagnosis not present

## 2021-08-21 DIAGNOSIS — I639 Cerebral infarction, unspecified: Secondary | ICD-10-CM

## 2021-08-21 NOTE — Patient Instructions (Addendum)
Continue working with therapies for hopeful ongoing recovery  Please let me know if you would like to look in to doing botox for left hand spasticity (tightness) but hopefully this will continue to get better working with therapies!   Continue clopidogrel 75 mg daily  and Crestor for secondary stroke prevention  Your loop recorder will be monitored by cardiology - first download scheduled today  Continue to follow up with PCP regarding cholesterol and blood pressure management  Maintain strict control of hypertension with blood pressure goal below 130/90 and cholesterol with LDL cholesterol (bad cholesterol) goal below 70 mg/dL.   Signs of a Stroke? Follow the BEFAST method:  Balance Watch for a sudden loss of balance, trouble with coordination or vertigo Eyes Is there a sudden loss of vision in one or both eyes? Or double vision?  Face: Ask the person to smile. Does one side of the face droop or is it numb?  Arms: Ask the person to raise both arms. Does one arm drift downward? Is there weakness or numbness of a leg? Speech: Ask the person to repeat a simple phrase. Does the speech sound slurred/strange? Is the person confused ? Time: If you observe any of these signs, call 911.     Followup in the future with me in 5 months or call earlier if needed       Thank you for coming to see [place] at [facility]. I hope we have been able to provide you high quality care today.  You may receive a patient satisfaction survey over the next few weeks. We would appreciate your feedback and comments so that we may continue to improve ourselves and the health of our patients.

## 2021-08-21 NOTE — Progress Notes (Signed)
[facility] [address] [place] [address] [phone]       OFFICE FOLLOW UP NOTE  Ms. Teresa [name] Date of Birth:  [birth year] Medical Record Number:  [id number]   Referring MD: Alvin Critchley  Primary neurologist: Dr. Leonie Man Reason for Referral: Stroke   Chief Complaint  Patient presents with   Follow-up    Rm 2 here for 3 month f/u. Reports she has been doing well working with [facility] physical therapy and occpational therapy 2 times per week.       HPI:   Update 08/21/2021 JM: Patient returns for 79-monthstroke follow-up accompanied by her husband.  She was previously seen by Dr. SLeonie Man  Continues to work with PT/OT making gradual recovery of left-sided weakness.  Denies any residual left leg weakness.  Ambulates with quad cane while outdoors and for long distance, does not use in home, denies any recent falls.  Will have difficulty making turns.  Continues to have hand and arm weakness, has difficulty picking up objects with left hand, use of compression glove which helps with hand swelling. Compliant on Plavix and Crestor, denies significant side effects.  Blood pressure today 138/79.  Does not routinely monitor at home as typically stable. Had loop recorder placed 4/13 by Dr. KCaryl Comeswith initial download scheduled today.  Routinely follows with PCP.  No new concerns at this time.    History provided for reference purposes only Consult visit 05/17/2021 Dr. SLeonie Man Ms. PFesselis a 81year old lady seen today for initial office consultation visit for stroke.  She is accompanied by her husband.  History is obtained from them and review of electronic medical records and opossum reviewed available pertinent imaging films in PACS.  She has past medical history of Raynaud's disease, hypothyroidism and skin cancer.  [facility].  She she was fine till 820 in the morning that day when  she went for a walk with her neighbor.  She was brought in as a code stroke on initial evaluation by neurologist Dr. SQuinn Axepatient had some inconsistent features on exam with some left-sided weakness but some giveaway weakness which could be overcome with coaching and exam was fluctuating due to inconsistency with exam thrombolysis and was not done but urgent MRI scan of the brain was obtained which actually did not show an acute stroke.  Subsequently a CT angiogram was obtained which showed a right M2 branch occlusion and at that time the CT scan showed a low-density in the right frontal and subinsular region consistent with acute stroke.  Echocardiogram showed ejection fraction of 65 to 70% without cardiac source of embolism.  Hemoglobin A1c of 5.4.  LDL cholesterol is 83.7.  Urine urine drug screen was negative.  Plan was to do outpatient cardiac monitoring in the discharge summary however it seems to not have happened so far.  [facility] and is now being at home.  She is finished home physical and occupational therapy and plans to start outpatient therapy soon.  She is now able to walk and uses a cane.  Left leg is still heavy and drags.  Speech has recovered back to baseline.  She is currently on Plavix which is tolerating well without bruising or bleeding.  Her blood pressure she states is quite well controlled at home though it is elevated slightly  in my office today at 149/68.  She remains on Crestor which is tolerating well without muscle aches and pains.  She has no other complaints today.    ROS:   14 system review of systems is positive for those listed in HPI and all other systems negative  PMH:  Past Medical History:  Diagnosis Date   Cancer ([place])    skin   Hypothyroidism    Raynaud's disease     Social History:  Social History   Socioeconomic History   Marital status: Married    Spouse name:  Teresa [name]   Number of children: Not on file   Years of education: Not on file   Highest education level: Not on file  Occupational History   Not on file  Tobacco Use   Smoking status: Never   Smokeless tobacco: Never  Vaping Use   Vaping Use: Never used  Substance and Sexual Activity   Alcohol use: Never   Drug use: Never   Sexual activity: Not on file  Other Topics Concern   Not on file  Social History Narrative   Not on file   Social Determinants of Health   Financial Resource Strain: Not on file  Food Insecurity: Not on file  Transportation Needs: Not on file  Physical Activity: Not on file  Stress: Not on file  Social Connections: Not on file  Intimate Partner Violence: Not on file    Medications:   Current Outpatient Medications on File Prior to Visit  Medication Sig Dispense Refill   Acetylcysteine (NAC) 600 MG CAPS Take 1 capsule (600 mg total) by mouth 2 (two) times daily. 60 capsule 0   Calcium Carb-Cholecalciferol 600-10 MG-MCG TABS Take 2 tablets by mouth daily. 60 tablet 0   clopidogrel (PLAVIX) 75 MG tablet Take 1 tablet (75 mg total) by mouth daily. 90 tablet 0   cyclobenzaprine (FLEXERIL) 10 MG tablet Take by mouth.     diclofenac Sodium (VOLTAREN) 1 % GEL Apply 2 g topically 4 (four) times daily. 200 g 0   doxycycline (PERIOSTAT) 20 MG tablet Take by mouth.     famotidine (PEPCID) 40 MG tablet Take 1 tablet by mouth at bedtime.     gabapentin (NEURONTIN) 300 MG capsule Take 1 capsule (300 mg total) by mouth at bedtime. 30 capsule 0   levothyroxine (SYNTHROID) 100 MCG tablet Take 1 tablet (100 mcg total) by mouth daily before breakfast. 30 tablet 0   Multiple Vitamins-Minerals (MULTIVITAMIN WITH MINERALS) tablet Take 1 tablet by mouth daily.     nabumetone (RELAFEN) 500 MG tablet Take by mouth.     nisoldipine (SULAR) 34 MG 24 hr tablet Take 34 mg by mouth daily.     omega-3 acid ethyl esters (LOVAZA) 1 g capsule Take 1 capsule (1,000 mg total) by  mouth daily. 30 capsule 0   rosuvastatin (CRESTOR) 10 MG tablet Take 1 tablet (10 mg total) by mouth at bedtime. 30 tablet 0   Vitamin D3 (VITAMIN D) 25 MCG tablet Take 1 tablet (1,000 Units total) by mouth daily. 30 tablet 0   No current facility-administered medications on file prior to visit.    Allergies:   Allergies  Allergen Reactions   Codeine Itching and Rash    Other reaction(s): Vomiting   Misc. Sulfonamide Containing Compounds Rash   Penicillins Itching, Rash and Swelling   Sulfa Antibiotics Hives, Itching and Rash    Physical Exam Today's Vitals   08/21/21 1003  BP: 138/79  Pulse: 77  Weight: 144 lb (65.3 kg)  Height: '5\' 2"'$  (1.575 m)   Body mass index is 26.34 kg/m.   General: well developed, well nourished, pleasant elderly lady seated, in no evident distress Head: head normocephalic and atraumatic.   Neck: supple with no carotid or supraclavicular bruits Cardiovascular: regular rate and rhythm, no murmurs Musculoskeletal: no deformity Skin:  no rash/petichiae Vascular:  Normal pulses all extremities  Neurologic Exam Mental Status: Awake and fully alert.  Fluent speech and language.  Oriented to place and time. Recent and remote memory intact. Attention span, concentration and fund of knowledge appropriate. Mood and affect appropriate.  Cranial Nerves: Pupils equal, briskly reactive to light. Extraocular movements full without nystagmus. Visual fields full to confrontation. Hearing intact. Facial sensation intact.  Mild left lower facial weakness., tongue, palate moves normally and symmetrically. Motor: Normal strength and tone right upper and lower extremity  LUE: 4/5 proximal and 3+-4-/5 distal, increased tone distally, mild hand edema LLE: 5/5 Sensory.: intact to touch , pinprick , position and vibratory sensation.  Coordination: Rapid alternating movements normal on right side. Finger-to-nose performed accurately RUE and heel-to-shin performed accurately  bilaterally. Gait and Station: Arises from chair with mild difficulty. Stance is normal.  Gait demonstrates decreased LLE stride length and step height with use of four-point cane.  Unble to heel, toe and tandem walk Reflexes: 2+ on the left and 1+ on the right.. Toes downgoing.         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 S/p ILR 07/04/2021 by Dr. Caryl Comes.  She is doing well but has mild residual left spastic hemiparesis and gait impairment/imbalance.  Vascular risk factors of hyperlipidemia and hypertension     PLAN:  -Continue working with PT/OT for hopeful ongoing recovery -Consider use of Botox for left hand spasticity -she will further discuss with OT and call office if interested in pursuing in the future -Continue Plavix and Crestor 10 mg daily for secondary stroke prevention measures -Ensure continued close PCP follow-up for aggressive stroke risk factor management including BP goal<130/90 and HLD with LDL goal<70 -Loop recorder monitored by cardiology -initial download scheduled today    Follow-up in 5 months or call earlier if needed    CC:  Idelle Crouch, MD   I spent 32 minutes of face-to-face and non-face-to-face time with patient and husband.  This included previsit chart review, lab review, study review, electronic health record documentation, patient and husband education and discussion regarding history of prior stroke with residual deficits, secondary stroke prevention measures and aggressive stroke risk factor management and answered all other questions to patient's and husband's satisfaction  Frann Rider, [facility]  [facility] [address] [place], [place] [address]  Phone [phone] Fax [phone] Note: This document was prepared with digital dictation and possible smart phrase technology. Any transcriptional errors  that result from this process are unintentional.

## 2021-08-22 ENCOUNTER — Ambulatory Visit: Payer: Medicare PPO | Admitting: Occupational Therapy

## 2021-08-22 ENCOUNTER — Ambulatory Visit: Payer: Medicare PPO

## 2021-08-22 DIAGNOSIS — R2681 Unsteadiness on feet: Secondary | ICD-10-CM | POA: Diagnosis not present

## 2021-08-22 DIAGNOSIS — M6281 Muscle weakness (generalized): Secondary | ICD-10-CM

## 2021-08-22 DIAGNOSIS — R482 Apraxia: Secondary | ICD-10-CM

## 2021-08-22 DIAGNOSIS — R278 Other lack of coordination: Secondary | ICD-10-CM

## 2021-08-22 DIAGNOSIS — R262 Difficulty in walking, not elsewhere classified: Secondary | ICD-10-CM

## 2021-08-22 LAB — CUP PACEART REMOTE DEVICE CHECK
Date Time Interrogation Session: 20230530225111
Implantable Pulse Generator Implant Date: 19440924

## 2021-08-22 NOTE — Therapy (Addendum)
[place] MAIN [facility] SERVICES [address] [place], [place], [address] Phone: [phone]   Fax:  [phone]  Occupational Therapy Treatment  Patient Details  Name: Teresa [name] MRN: [id number] Date of Birth: February 17, 1941 No data recorded  Encounter Date: 08/22/2021   OT End of Session - 08/22/21 2019     Visit Number 26    Number of Visits 48    Date for OT Re-Evaluation 11/08/21    OT Start Time 1015    OT Stop Time 1100    OT Time Calculation (min) 45 min    Activity Tolerance Patient tolerated treatment well    Behavior During Therapy [facility] for tasks assessed/performed             Past Medical History:  Diagnosis Date   Cancer ([place])    skin   Hypothyroidism    Raynaud's disease     Past Surgical History:  Procedure Laterality Date   [place]   BACK SURGERY  2010   Cervical fusion C4-5-6-7   CATARACT EXTRACTION, BILATERAL Bilateral 10/2016   CHOLECYSTECTOMY  1995   COLONOSCOPY WITH PROPOFOL N/A 12/08/2018   Procedure: COLONOSCOPY WITH PROPOFOL;  Surgeon: Toledo, Benay Pike, MD;  Location: [facility] ENDOSCOPY;  Service: Gastroenterology;  Laterality: N/A;   EYE SURGERY      There were no vitals filed for this visit.   Subjective Assessment - 08/22/21 2018     Subjective  Pt reports she is having pain in her neck today    Pertinent History February 26, 2021, pt reports she had a CVA, came to [facility] to ER and then was transferred to [facility] in [place] where she was admitted and after acute care she went to inpatient rehab.  Following inpt rehab, pt went home and had home health.    Patient Stated Goals Pt reports her goal is to get back to normal, be as independent as possible.    Currently in Pain? Yes    Pain Score 3     Pain Location Neck    Pain Orientation Left    Pain Onset More than a month ago    Pain Onset In the past 7 days            Pt reports she  went to the [facility] with friends to see [place].  No difficulties with ambulation to get into the venue.  Neck pain 3/10 Arm pain 2/10   Manual therapy to left side of neck and upper traps to release tension and stretch to allow increased movement of left UE.   Therapeutic Exercises:  Pt seen for UE strengthening with use of Yellow theraband exercises with assist from therapist for shoulder flexion, ABD, diagonal patterns, elbow flex/ext and shoulder rows for scapular retraction.  Assist for placing theraband in and around left hand and guiding during active motion to facilitate movement patterns.  ROM and strengthening with use of 1# dowel in sitting , guiding from therapist as needed, cues for proper form and technique.   Neuromuscular Reeducation:   Pt seen for use of playing Cards, flipping cards, grasping, cues for thumb finger combinations with alternating fingers on top versus thumb on top with flipping motions.  Cards placed at edge of the table and flipping to place into a stack with emphasis on coordination and pinch patterns.  Pt to add card flipping to HEP  Response to tx: Pt continues  to make good progress in all areas, remains limited by pain in left UE with active motion and with attempts to reach greater than 90 degrees of shoulder flexion.  She is making progress towards hand function and now able to grossly grasp playing cards which require a bit more precision in her pinch and grasping skills.  Requires therapist assist and guiding with theraband and dowel this date in sitting for facilitation of ROM with proper form and technique.  Continue to work towards goals in plan of care to maximize safety and independence in daily tasks.                        OT Education - 08/22/21 2018     Education Details reaching, grasp and release, cards    Person(s) Educated Patient    Methods Explanation;Demonstration;Tactile cues;Verbal cues    Comprehension  Verbalized understanding;Returned demonstration;Verbal cues required;Need further instruction;Tactile cues required                 OT Long Term Goals - 08/17/21 0925       OT LONG TERM GOAL #1   Title Pt will be independent with home exercise program.    Baseline Eval: no current program, 10th visit:  continue to add new exercises as pt progresses, 20th:  continue to update HEP; 08/17/21: continue to progress HEP when indicated    Time 12    Period Weeks    Status On-going    Target Date 11/08/21      OT LONG TERM GOAL #2   Title Pt will complete UB and LB dressing with modified independence including buttons, snaps and zippers.    Baseline requires min assist at eval, 10th visit: occasional assist with buttons, 20th:  able to perform one handed, but difficulty with bilateral UE; 08/17/21: pt reports inconsistent with 1 hand, reviewed techniques this visit    Time 12    Period Weeks    Status On-going    Target Date 11/08/21      OT LONG TERM GOAL #3   Title Pt will perform shower transfer with modified independence.    Baseline Pt requires supervision to min assist for shower transfer at home. 10th visit: supervision; 08/17/21: supv    Time 6    Period Weeks    Status Partially Met    Target Date 09/27/21      OT LONG TERM GOAL #4   Title Pt will improve L hand grip by 10# to assist with holding items in left hand securely.    Baseline no grip in left hand at eval, 10th visit:  improved flexion but still working towards composite fisting and grip. 20th:  continues to demo decreased grip; 08/17/21: active digit flexion improving, but not yet able to register grip on dynamometer    Time 12    Period Weeks    Status On-going    Target Date 11/08/21      OT LONG TERM GOAL #5   Title Pt will improve left shoulder flexion to 100 degrees or better to improve reaching to obtain self care items from shelf/shoulder height.    Baseline difficulty with reach, shoulder flexion to 47  degrees; 08/17/21: L shoulder flexion 85, but not yet able to consistently hold ADL supplies in L hand when reaching    Time 12    Period Weeks    Status Revised    Target Date 11/08/21  OT LONG TERM GOAL #6   Title Pt will improve FOTO score to 47 or above to demonstrate a clinically relevant change in function to impact ADL tasks.    Baseline score of 30 at eval; 08/17/21: FOTO: 42    Time 12    Period Weeks    Status On-going    Target Date 11/08/21      OT LONG TERM GOAL #7   Title Pt will demonstrate ability to pick up small objects and complete 9 hole peg test in less than 2 mins.    Baseline unable to perform at eval, 10th visit: still unable to pick up small pegs, 20th: unable to pick up pegs but improving; 08/17/21: not attempted d/t time constraints, will assess next visit    Time 12    Period Weeks    Status On-going    Target Date 11/08/21                   Plan - 08/22/21 2019     Clinical Impression Statement P    OT Occupational Profile and History Detailed Assessment- Review of Records and additional review of physical, cognitive, psychosocial history related to current functional performance    Occupational performance deficits (Please refer to evaluation for details): ADL's;IADL's;Leisure;Rest and Sleep    Body Structure / Function / Physical Skills ADL;Coordination;Endurance;GMC;UE functional use;Balance;IADL;Pain;Dexterity;FMC;Strength;Edema;Mobility;ROM    Psychosocial Skills Environmental  Adaptations;Habits;Routines and Behaviors    Rehab Potential Good    Clinical Decision Making Several treatment options, min-mod task modification necessary    Comorbidities Affecting Occupational Performance: May have comorbidities impacting occupational performance    Modification or Assistance to Complete Evaluation  Min-Moderate modification of tasks or assist with assess necessary to complete eval    OT Frequency 2x / week    OT Duration 12 weeks    OT  Treatment/Interventions Self-care/ADL training;Cryotherapy;Paraffin;Therapeutic exercise;DME and/or AE instruction;Functional Mobility Training;Balance training;Electrical Stimulation;Ultrasound;Neuromuscular education;Manual Therapy;Splinting;Moist Heat;Contrast Bath;Passive range of motion;Therapeutic activities;Patient/family education;Coping strategies training    Plan OT recert    Consulted and Agree with Plan of Care Patient             Patient will benefit from skilled therapeutic intervention in order to improve the following deficits and impairments:   Body Structure / Function / Physical Skills: ADL, Coordination, Endurance, GMC, UE functional use, Balance, IADL, Pain, Dexterity, FMC, Strength, Edema, Mobility, ROM   Psychosocial Skills: Environmental  Adaptations, Habits, Routines and Behaviors   Visit Diagnosis: Muscle weakness (generalized)  Other lack of coordination  Apraxia    Problem List Patient Active Problem List   Diagnosis Date Noted   Right middle cerebral artery stroke ([place]) 03/01/2021   Stroke ([place]) 02/27/2021   History of nonmelanoma skin cancer 05/23/2014   Erlin Gardella Oneita Jolly, OTR/L, CLT  Murle Otting, OT 08/22/2021, 8:20 PM  [place] [address] [place], [place], [address] Phone: [phone]   Fax:  [phone]  Name: Teresa [name] MRN: [id number] Date of Birth: Feb 02, 1941

## 2021-08-22 NOTE — Therapy (Signed)
[place] MAIN [facility] SERVICES [address] [place], [place], [address] Phone: [phone]   Fax:  [phone]  Physical Therapy Treatment  Patient Details  Name: Teresa [name] MRN: [id number] Date of Birth: 04-01-1940 Referring Provider (PT): [name], MD   Encounter Date: 08/22/2021   PT End of Session - 08/22/21 0936     Visit Number 25    Number of Visits 40    Date for PT Re-Evaluation 10/12/21    Authorization Type [employer] PPO-    Authorization Time Period cert 2/72/5366-4/40/3474    PT Start Time 0932    PT Stop Time 1013    PT Time Calculation (min) 41 min    Equipment Utilized During Treatment Gait belt    Activity Tolerance Patient tolerated treatment well    Behavior During Therapy [facility] for tasks assessed/performed             Past Medical History:  Diagnosis Date   Cancer ([place])    skin   Hypothyroidism    Raynaud's disease     Past Surgical History:  Procedure Laterality Date   [place]   BACK SURGERY  2010   Cervical fusion C4-5-6-7   CATARACT EXTRACTION, BILATERAL Bilateral 10/2016   CHOLECYSTECTOMY  1995   COLONOSCOPY WITH PROPOFOL N/A 12/08/2018   Procedure: COLONOSCOPY WITH PROPOFOL;  Surgeon: Toledo, Benay Pike, MD;  Location: [facility] ENDOSCOPY;  Service: Gastroenterology;  Laterality: N/A;   EYE SURGERY      There were no vitals filed for this visit.   Subjective Assessment - 08/22/21 0931     Subjective Pt with no major changes. No stumbles/falls. Pt reports bed mobility technique has been helpful.    Pertinent History Pt is a 81 y.o. 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 Pt completed CIR from 03/02/21-03/22/21. PMH includes: skin cancer, hypothyroidism, and Raynaud's disease, history of cervical fusion in 2010 C4-C7.  Pt reports HH PT/OT/SLP was performed from January 2nd to February 28th with focus on ADL completion,  ambulation. Pt's big goal tranisitioning to ambulating up to 3 miles a day which pt was doing prior to stroke. Pt has been ambulating in [employer] with husband but last day or two pt has noticed increased L foot drag but states it is better today. Pt denies any falls at home or community. Pt reports balance deficits with turns primarily. States her balance is good with her quad cane. Wishing to return to full independence in ambulation with household and community ambulation. Wishing to be able to asc/desc full flight of stairs with ability to carry decorative items and other household items without need for handrail and/or AD.    Limitations Lifting;Standing;Walking;House hold activities    How long can you sit comfortably? unlimited    How long can you stand comfortably? unlimited    How long can you walk comfortably? ~ 1.5 hours    Patient Stated Goals Return to independent walking for 3 miles. Return to to crochet.    Currently in Pain? No/denies    Pain Onset More than a month ago    Pain Onset In the past 7 days              Interventions- gait belt donned and CGA provided unless otherwise specified  Ambulating in hallway, reading sticky notes to incorporate vertical and horizontal head turns 2x without AD. VC/demo for technique to  utilize cervical rotation.  Ambulating in hallway with vertical head turns 2x without AD Most difficulty with turning to initiate second set  SPT assisting with intervention- SPT calling out which color balance pod to ambulate to and ambulate around to promote balance with turning and balance with dual cog task x several minutes  Progressed to adding in alternating toe tap on balance pod to promote SLB   Pt with few instances of foot drag throughout  At support surface- SLB 2x30 sec each LE   Ambulating forward followed by sudden pivot turns x multiple reps bilaterally - no unsteadiness noted.   360 turns against wall 10x. No nausea/symptoms with  intervention on this date.  Obstacle course with cones and balance pods to promote turning and scanning for objects - pt knocks into cones/pods multiple times  -progressed to performing with dual motor task   -further progression to performing with dual cognitive and motor task  Airex pad: NBOS with vertical and horizontal head turns x multiple reps of each NBOS EC 60 sec with increased sway    Pt educated throughout session about proper posture and technique with exercises. Improved exercise technique, movement at target joints, use of target muscles after min to mod verbal, visual, tactile cues.  Rationale for Evaluation and Treatment Rehabilitation      PT Education - 08/22/21 0935     Education Details exercise technique    Person(s) Educated Patient    Methods Explanation;Demonstration;Verbal cues    Comprehension Verbalized understanding;Returned demonstration;Need further instruction              PT Short Term Goals - 08/03/21 1130       PT SHORT TERM GOAL #1   Title Pt will be indep with HEP to improve balance, strength, and gait to optimize independence with ADL completion    Baseline 05/24/21: Initiated 4/7: to be advanced; 5/12: to be advanced    Time 6    Period Weeks    Status On-going    Target Date 09/14/21               PT Long Term Goals - 08/17/21 1010       PT LONG TERM GOAL #1   Title Pt will improve FOTO to target score to 67 demonstrate clinically significant improvement in functional mobility    Baseline 05/24/21: next session 4/7: 64; 5/12: 70; 5/26: 72%    Time 12    Period Weeks    Status Achieved    Target Date 08/02/21      PT LONG TERM GOAL #2   Title Pt will improve 5xSTS to 12 sec or less to indicate clinically significant improvement in LE strength    Baseline 05/24/21: 17.51 sec 4/7: 14.2 sec hands-free; 5/12: 10.3 sec hands-free    Time 12    Period Weeks    Status Achieved    Target Date 08/16/21      PT LONG TERM GOAL #3    Title Pt will improve 6 MWT by 165' to indicate clinically significant improvement in community ambulation distance for geriatric stroke norms.    Baseline 05/24/21: 765' 4/6: 842 ft with SPC; 5/12: 1060 ft without AD    Time 12    Period Weeks    Status Achieved    Target Date 08/16/21      PT LONG TERM GOAL #4   Title Pt will improve 10 m gait speed to at least 1.0 m/s to demonstrate reduced risk  of falls with community ambulation tasks.    Baseline 05/24/21: .63 m/s without AD 4/6: 0.83 without an AD; 5/12: 0.93 m/s; 5/26: 1.16 m/s    Time 12    Period Weeks    Status Achieved    Target Date 08/16/21      PT LONG TERM GOAL #5   Title Pt will improve FGA  by at least 5 point to demonstrate clinically signifcant reduced risk of falls with dynamic balance/ambulation tasks.    Baseline 05/24/21: 17; 4/7: 22    Time 12    Period Weeks    Status Achieved    Target Date 08/16/21      Additional Long Term Goals   Additional Long Term Goals Yes      PT LONG TERM GOAL #6   Title Pt will improve 6 MWT by 165' to indicate clinically significant improvement in community ambulation distance for geriatric stroke norms.    Baseline 5/12: 5/12: 1060 ft without AD; 5/26: 1178 ft no AD    Time 8    Period Weeks    Status Partially Met    Target Date 10/12/21      PT LONG TERM GOAL #7   Title Patient will tolerate 5 seconds of single leg stance bilat without loss of balance to improve ability to get in and out of shower safely.    Baseline 5/12: 3-4 sec bilat; 5/26: L 21 seconds, R 11 seconds    Time 12    Period Weeks    Status Achieved    Target Date 10/26/21      PT LONG TERM GOAL #8   Title Pt will report a 50% improvement in steadiness with turning to walk into and out of rooms in her home in order to reduce fall risk.    Baseline 5/26: pt reports unsteadiness 75% of the time    Time 8    Period Weeks    Status New    Target Date 10/12/21                   Plan -  08/22/21 1405     Clinical Impression Statement Focus of session on balance with turning. Pt noted to have increased sway with turning while ambulating, and additionally exhibited decreased scanning for objects. During obstacle course pt knocked into multiple objects. This did improve some with repitition of task. Will continue to focus on this impairment next session. The pt will benefit from further skilled PT to improve balance, strength, gait and mobility to decrease fall risk and increase QOL.    Personal Factors and Comorbidities Age;Time since onset of injury/illness/exacerbation;Comorbidity 3+;Past/Current Experience    Examination-Activity Limitations Squat;Stairs;Locomotion Level;Stand    Examination-Participation Restrictions Shop;Community Activity;Yard Work    [employer] Evolving/Moderate complexity    Rehab Potential Good    PT Frequency 2x / week    PT Duration 12 weeks    PT Treatment/Interventions ADLs/Self Care Home Management;Canalith Repostioning;Electrical Stimulation;DME Instruction;Gait training;Stair training;Functional mobility training;Therapeutic activities;Patient/family education;Therapeutic exercise;Balance training;Neuromuscular re-education;Energy conservation;Vestibular;Passive range of motion    PT Next Visit Plan Progress LE strength, Gait training - heel to toe sequencing, progress balance as appropriate. Continue plan    PT Home Exercise Plan Seated L ankle DF; 3/10 Access Code: [email or web address]; 5/10:  Access Code: [email or web address]; no updates    Consulted and Agree with Plan of Care Patient             Patient will benefit from skilled  therapeutic intervention in order to improve the following deficits and impairments:  Abnormal gait, Pain, Decreased mobility, Decreased activity tolerance, Decreased endurance, Decreased strength, Decreased balance, Difficulty walking  Visit Diagnosis: Unsteadiness on feet  Difficulty in walking, not elsewhere  classified  Muscle weakness (generalized)     Problem List Patient Active Problem List   Diagnosis Date Noted   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 [place], [place], [address] Phone: [phone]   Fax:  [phone]  Name: Teresa [name] MRN: [id number] Date of Birth: 12/27/1940

## 2021-08-23 ENCOUNTER — Encounter: Payer: Medicare PPO | Admitting: Speech Pathology

## 2021-08-24 ENCOUNTER — Ambulatory Visit: Payer: Medicare PPO

## 2021-08-24 ENCOUNTER — Ambulatory Visit: Payer: Medicare PPO | Attending: Internal Medicine

## 2021-08-24 DIAGNOSIS — I63511 Cerebral infarction due to unspecified occlusion or stenosis of right middle cerebral artery: Secondary | ICD-10-CM | POA: Insufficient documentation

## 2021-08-24 DIAGNOSIS — R262 Difficulty in walking, not elsewhere classified: Secondary | ICD-10-CM | POA: Diagnosis present

## 2021-08-24 DIAGNOSIS — R2681 Unsteadiness on feet: Secondary | ICD-10-CM | POA: Insufficient documentation

## 2021-08-24 DIAGNOSIS — R278 Other lack of coordination: Secondary | ICD-10-CM | POA: Insufficient documentation

## 2021-08-24 DIAGNOSIS — R482 Apraxia: Secondary | ICD-10-CM | POA: Insufficient documentation

## 2021-08-24 DIAGNOSIS — M6281 Muscle weakness (generalized): Secondary | ICD-10-CM

## 2021-08-24 DIAGNOSIS — M79604 Pain in right leg: Secondary | ICD-10-CM | POA: Insufficient documentation

## 2021-08-24 NOTE — Therapy (Signed)
[place] MAIN [facility] SERVICES [address] [place], [place], [address] Phone: [phone]   Fax:  [phone]  Occupational Therapy Treatment  Patient Details  Name: Teresa [name] MRN: [id number] Date of Birth: May 27, 1940 No data recorded  Encounter Date: 08/24/2021   OT End of Session - 08/24/21 1704     Visit Number 27    Number of Visits 48    Date for OT Re-Evaluation 11/08/21    OT Start Time 6803    OT Stop Time 1100    OT Time Calculation (min) 45 min    Activity Tolerance Patient tolerated treatment well    Behavior During Therapy [facility] for tasks assessed/performed             Past Medical History:  Diagnosis Date   Cancer ([place])    skin   Hypothyroidism    Raynaud's disease     Past Surgical History:  Procedure Laterality Date   [place]   BACK SURGERY  2010   Cervical fusion C4-5-6-7   CATARACT EXTRACTION, BILATERAL Bilateral 10/2016   CHOLECYSTECTOMY  1995   COLONOSCOPY WITH PROPOFOL N/A 12/08/2018   Procedure: COLONOSCOPY WITH PROPOFOL;  Surgeon: Toledo, Benay Pike, MD;  Location: [facility] ENDOSCOPY;  Service: Gastroenterology;  Laterality: N/A;   EYE SURGERY      There were no vitals filed for this visit.   Subjective Assessment - 08/24/21 1658     Subjective  Pt reports a lot of neck and upper trap stiffness today.    Pertinent History February 26, 2021, pt reports she had a CVA, came to [facility] to ER and then was transferred to [facility] in [place] where she was admitted and after acute care she went to inpatient rehab.  Following inpt rehab, pt went home and had home health.    Patient Stated Goals Pt reports her goal is to get back to normal, be as independent as possible.    Currently in Pain? Yes    Pain Score 3     Pain Location Neck    Pain Orientation Left    Pain Descriptors / Indicators Sore;Tightness    Pain Type Chronic pain    Pain Radiating  Towards upper trap, shoulder    Pain Onset More than a month ago    Pain Frequency Constant    Pain Relieving Factors heat, massage, ROM    Effect of Pain on Daily Activities general discomfort sitting/standing    Multiple Pain Sites Yes    Pain Score 3    Pain Location Hip    Pain Orientation Right    Pain Descriptors / Indicators Aching    Pain Type Chronic pain    Pain Onset In the past 7 days    Pain Frequency Intermittent            Occupational Therapy Treatment: Manual Therapy: Targeted tight musculature at L upper trap with soft tissue massage, working to reduce pain and stiffness in neck and shoulder.  Performed manual scapular gliding all planes, and passive L shoulder depression and retraction stretch.  Edema massage performed to L hand digits in prep for PROM and functional use.   Therapeutic Exercise: Performed passive L wrist flex/ext, digit flex/ext all digits, and PROM/AAROM for L shoulder flex/abd/horiz abd/add, prolonged stretch for ER, working to increase joint flexibility for increased functional use of LUE.  Instructed pt in self passive stretching for L shoulder  ER with elbow at side with good return demo and encouraged daily completion.  Performed AAROM for L hand closed fist, alternated by digit extension, digit abd/add x10 each, cues for increasing range with thumb and second digit with each ext and abduction rep.   Neuro re-ed: Practiced grasping circular wooden blocks from table top, reaching/grasping/releasing with L hand in all planes of motion below shoulder level.  Pt required min/mod A to reach for block at shoulder level.  Practiced closed hand around block and performing pron/sup without dropping block in hand with good ability.  May progress to smaller item next session.   Response to Treatment: Practiced grasping circular wooden blocks from table top, reaching/grasping/releasing with L hand in all planes of motion below shoulder level.  Pt required  min/mod A to reach for block at shoulder level d/t weakness and stiffness in L shoulder.  Practiced closed hand around block and performing pron/sup without dropping block in hand with good ability.  May progress to smaller item next session.  Pt continues to report neck and upper trap pain.  Pt responded well to [facility] and passive scapular and shoulder mobility.  Upper arm bicep knotting appears much better.  Pt will continue to benefit from skilled OT to work towards goals in plan of care to improve LUE functional use for ADL and IADL tasks     OT Education - 08/24/21 1703     Education Details self stretch for ER of L shoulder    Person(s) Educated Patient    Methods Explanation;Demonstration;Tactile cues;Verbal cues    Comprehension Verbalized understanding;Returned demonstration;Verbal cues required;Need further instruction;Tactile cues required                 OT Long Term Goals - 08/17/21 0925       OT LONG TERM GOAL #1   Title Pt will be independent with home exercise program.    Baseline Eval: no current program, 10th visit:  continue to add new exercises as pt progresses, 20th:  continue to update HEP; 08/17/21: continue to progress HEP when indicated    Time 12    Period Weeks    Status On-going    Target Date 11/08/21      OT LONG TERM GOAL #2   Title Pt will complete UB and LB dressing with modified independence including buttons, snaps and zippers.    Baseline requires min assist at eval, 10th visit: occasional assist with buttons, 20th:  able to perform one handed, but difficulty with bilateral UE; 08/17/21: pt reports inconsistent with 1 hand, reviewed techniques this visit    Time 12    Period Weeks    Status On-going    Target Date 11/08/21      OT LONG TERM GOAL #3   Title Pt will perform shower transfer with modified independence.    Baseline Pt requires supervision to min assist for shower transfer at home. 10th visit: supervision; 08/17/21: supv    Time 6     Period Weeks    Status Partially Met    Target Date 09/27/21      OT LONG TERM GOAL #4   Title Pt will improve L hand grip by 10# to assist with holding items in left hand securely.    Baseline no grip in left hand at eval, 10th visit:  improved flexion but still working towards composite fisting and grip. 20th:  continues to demo decreased grip; 08/17/21: active digit flexion improving, but not yet able to  register grip on dynamometer    Time 12    Period Weeks    Status On-going    Target Date 11/08/21      OT LONG TERM GOAL #5   Title Pt will improve left shoulder flexion to 100 degrees or better to improve reaching to obtain self care items from shelf/shoulder height.    Baseline difficulty with reach, shoulder flexion to 47 degrees; 08/17/21: L shoulder flexion 85, but not yet able to consistently hold ADL supplies in L hand when reaching    Time 12    Period Weeks    Status Revised    Target Date 11/08/21      OT LONG TERM GOAL #6   Title Pt will improve FOTO score to 47 or above to demonstrate a clinically relevant change in function to impact ADL tasks.    Baseline score of 30 at eval; 08/17/21: FOTO: 42    Time 12    Period Weeks    Status On-going    Target Date 11/08/21      OT LONG TERM GOAL #7   Title Pt will demonstrate ability to pick up small objects and complete 9 hole peg test in less than 2 mins.    Baseline unable to perform at eval, 10th visit: still unable to pick up small pegs, 20th: unable to pick up pegs but improving; 08/17/21: not attempted d/t time constraints, will assess next visit    Time 12    Period Weeks    Status On-going    Target Date 11/08/21             Plan - 08/24/21 1958     Clinical Impression Statement Practiced grasping circular wooden blocks from table top, reaching/grasping/releasing with L hand in all planes of motion below shoulder level.  Pt required min/mod A to reach for block at shoulder level d/t weakness and stiffness in L  shoulder.  Practiced closed hand around block and performing pron/sup without dropping block in hand with good ability.  May progress to smaller item next session.  Pt continues to report neck and upper trap pain.  Pt responded well to [facility] and passive scapular and shoulder mobility.  Upper arm bicep knotting appears much better.  Pt will continue to benefit from skilled OT to work towards goals in plan of care to improve LUE functional use for ADL and IADL tasks    OT Occupational Profile and History Detailed Assessment- Review of Records and additional review of physical, cognitive, psychosocial history related to current functional performance    Occupational performance deficits (Please refer to evaluation for details): ADL's;IADL's;Leisure;Rest and Sleep    Body Structure / Function / Physical Skills ADL;Coordination;Endurance;GMC;UE functional use;Balance;IADL;Pain;Dexterity;FMC;Strength;Edema;Mobility;ROM    Psychosocial Skills Environmental  Adaptations;Habits;Routines and Behaviors    Rehab Potential Good    Clinical Decision Making Several treatment options, min-mod task modification necessary    Comorbidities Affecting Occupational Performance: May have comorbidities impacting occupational performance    Modification or Assistance to Complete Evaluation  Min-Moderate modification of tasks or assist with assess necessary to complete eval    OT Frequency 2x / week    OT Duration 12 weeks    OT Treatment/Interventions Self-care/ADL training;Cryotherapy;Paraffin;Therapeutic exercise;DME and/or AE instruction;Functional Mobility Training;Balance training;Electrical Stimulation;Ultrasound;Neuromuscular education;Manual Therapy;Splinting;Moist Heat;Contrast Bath;Passive range of motion;Therapeutic activities;Patient/family education;Coping strategies training    Plan OT recert    Consulted and Agree with Plan of Care Patient  Patient will benefit from skilled therapeutic  intervention in order to improve the following deficits and impairments:   Body Structure / Function / Physical Skills: ADL, Coordination, Endurance, GMC, UE functional use, Balance, IADL, Pain, Dexterity, FMC, Strength, Edema, Mobility, ROM   Psychosocial Skills: Environmental  Adaptations, Habits, Routines and Behaviors   Visit Diagnosis: Muscle weakness (generalized)  Other lack of coordination  Right middle cerebral artery stroke [facility])    Problem List Patient Active Problem List   Diagnosis Date Noted   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 [place], [place], [address] Phone: [phone]   Fax:  [phone]  Name: Teresa [name] MRN: [id number] Date of Birth: 23-May-1940

## 2021-08-24 NOTE — Therapy (Signed)
[place] [facility] SERVICES [address] [place], [place], [address] Phone: [phone]   Fax:  [phone]  Physical Therapy Treatment  Patient Details  Name: Teresa [name] MRN: [id number] Date of Birth: 04-18-1940 Referring Provider (PT): [name], MD   Encounter Date: 08/24/2021   PT End of Session - 08/24/21 1039     Visit Number 26    Number of Visits 40    Date for PT Re-Evaluation 10/12/21    Authorization Type [employer] PPO-    Authorization Time Period cert 10/11/9468-9/62/8366    PT Start Time 0925    PT Stop Time 1006    PT Time Calculation (min) 41 min    Equipment Utilized During Treatment Gait belt    Activity Tolerance Patient tolerated treatment well    Behavior During Therapy [facility] for tasks assessed/performed             Past Medical History:  Diagnosis Date   Cancer ([place])    skin   Hypothyroidism    Raynaud's disease     Past Surgical History:  Procedure Laterality Date   [place]   BACK SURGERY  2010   Cervical fusion C4-5-6-7   CATARACT EXTRACTION, BILATERAL Bilateral 10/2016   CHOLECYSTECTOMY  1995   COLONOSCOPY WITH PROPOFOL N/A 12/08/2018   Procedure: COLONOSCOPY WITH PROPOFOL;  Surgeon: Toledo, Benay Pike, MD;  Location: [facility] ENDOSCOPY;  Service: Gastroenterology;  Laterality: N/A;   EYE SURGERY      There were no vitals filed for this visit.   Subjective Assessment - 08/24/21 0922     Subjective No major changes since previous sessions. R hip not currently hurting.    Pertinent History Pt is a 81 y.o. 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 Pt completed CIR from 03/02/21-03/22/21. PMH includes: skin cancer, hypothyroidism, and Raynaud's disease, history of cervical fusion in 2010 C4-C7.  Pt reports HH PT/OT/SLP was performed from January 2nd to February 28th with focus on ADL completion, ambulation. Pt's big goal  tranisitioning to ambulating up to 3 miles a day which pt was doing prior to stroke. Pt has been ambulating in [employer] with husband but last day or two pt has noticed increased L foot drag but states it is better today. Pt denies any falls at home or community. Pt reports balance deficits with turns primarily. States her balance is good with her quad cane. Wishing to return to full independence in ambulation with household and community ambulation. Wishing to be able to asc/desc full flight of stairs with ability to carry decorative items and other household items without need for handrail and/or AD.    Limitations Lifting;Standing;Walking;House hold activities    How long can you sit comfortably? unlimited    How long can you stand comfortably? unlimited    How long can you walk comfortably? ~ 1.5 hours    Patient Stated Goals Return to independent walking for 3 miles. Return to to crochet.    Currently in Pain? No/denies    Pain Onset More than a month ago    Pain Onset In the past 7 days              Interventions- gait belt donned and CGA provided unless otherwise specified  Obstacle course: multiple cones on floor, pt instructed to ambulate around cones to promote turning and scanning for obstacles. Pt knocks into cones intermittently.  -  progressed to use of dual cog task while completing obstacle course  -progressed to addition of agility ladder with dual cog task to increase challenge/attention to scanning  -progressed to addition of high knee march through agility ladder to promote SLB  Obstacle course: multiple cones placed on floor. Pt instructed to utilize toe taps on cones(performed each LE) to promote SLB and to navigate around cones without knocking them over x multiple reps  Standing on airex pad WBOS. Pt instructed to utilize horizontal head turns/visual scanning while identifying like colors on two different pictures for dual task.   -progressed to performing in tandem  stance  Ambulating in hallway, reading sticky notes to incorporate vertical and horizontal head turns 2x without AD. VC/demo for technique to utilize cervical rotation.   Ambulating in hallway with vertical head turns 2x length of hallway. More challenging for pt. Exhibits some decreased postural stability.    Ball toss with PT, passing laterally to promote turning. SPT providing CGA x 5 min  On airex: Slow high knee march- cuing for foot placement 2x20 alt LE  Semi-tandem gait on airex beam 8x. Pt rates as challenging. Noted unsteadiness.   SLB with EC and EO x multiple 20-30 sec bouts each LE  NBOS EC 60 sec  -progressed to performing with vertical head turns. Increased sway noted.     Pt educated throughout session about proper posture and technique with exercises. Improved exercise technique, movement at target joints, use of target muscles after min to mod verbal, visual, tactile cues.   Rationale for Evaluation and Treatment Rehabilitation    PT Education - 08/24/21 1038     Education Details exercise technique, body mechanics, instruction in visual scanning for safety    Person(s) Educated Patient    Methods Explanation;Demonstration;Verbal cues    Comprehension Verbalized understanding;Returned demonstration;Verbal cues required;Need further instruction              PT Short Term Goals - 08/03/21 1130       PT SHORT TERM GOAL #1   Title Pt will be indep with HEP to improve balance, strength, and gait to optimize independence with ADL completion    Baseline 05/24/21: Initiated 4/7: to be advanced; 5/12: to be advanced    Time 6    Period Weeks    Status On-going    Target Date 09/14/21               PT Long Term Goals - 08/17/21 1010       PT LONG TERM GOAL #1   Title Pt will improve FOTO to target score to 67 demonstrate clinically significant improvement in functional mobility    Baseline 05/24/21: next session 4/7: 64; 5/12: 70; 5/26: 72%    Time 12     Period Weeks    Status Achieved    Target Date 08/02/21      PT LONG TERM GOAL #2   Title Pt will improve 5xSTS to 12 sec or less to indicate clinically significant improvement in LE strength    Baseline 05/24/21: 17.51 sec 4/7: 14.2 sec hands-free; 5/12: 10.3 sec hands-free    Time 12    Period Weeks    Status Achieved    Target Date 08/16/21      PT LONG TERM GOAL #3   Title Pt will improve 6 MWT by 165' to indicate clinically significant improvement in community ambulation distance for geriatric stroke norms.    Baseline 05/24/21: 765' 4/6: 842 ft with  SPC; 5/12: 1060 ft without AD    Time 12    Period Weeks    Status Achieved    Target Date 08/16/21      PT LONG TERM GOAL #4   Title Pt will improve 10 m gait speed to at least 1.0 m/s to demonstrate reduced risk of falls with community ambulation tasks.    Baseline 05/24/21: .63 m/s without AD 4/6: 0.83 without an AD; 5/12: 0.93 m/s; 5/26: 1.16 m/s    Time 12    Period Weeks    Status Achieved    Target Date 08/16/21      PT LONG TERM GOAL #5   Title Pt will improve FGA  by at least 5 point to demonstrate clinically signifcant reduced risk of falls with dynamic balance/ambulation tasks.    Baseline 05/24/21: 17; 4/7: 22    Time 12    Period Weeks    Status Achieved    Target Date 08/16/21      Additional Long Term Goals   Additional Long Term Goals Yes      PT LONG TERM GOAL #6   Title Pt will improve 6 MWT by 165' to indicate clinically significant improvement in community ambulation distance for geriatric stroke norms.    Baseline 5/12: 5/12: 1060 ft without AD; 5/26: 1178 ft no AD    Time 8    Period Weeks    Status Partially Met    Target Date 10/12/21      PT LONG TERM GOAL #7   Title Patient will tolerate 5 seconds of single leg stance bilat without loss of balance to improve ability to get in and out of shower safely.    Baseline 5/12: 3-4 sec bilat; 5/26: L 21 seconds, R 11 seconds    Time 12    Period Weeks     Status Achieved    Target Date 10/26/21      PT LONG TERM GOAL #8   Title Pt will report a 50% improvement in steadiness with turning to walk into and out of rooms in her home in order to reduce fall risk.    Baseline 5/26: pt reports unsteadiness 75% of the time    Time 8    Period Weeks    Status New    Target Date 10/12/21                   Plan - 08/24/21 1007     Clinical Impression Statement Continued focus on interventions that promote scanning and SLB. Pt able to perform more challenging versions of these interventions involving cognitive dual task, but does continue to knock into cones. After multiple repitions pt with carryover verbalize noticing when her foot was too close to objects. The pt will benefit from further skilled PT to improve balance, gait, and mobility to decrease fall risk and increase QOL.    Personal Factors and Comorbidities Age;Time since onset of injury/illness/exacerbation;Comorbidity 3+;Past/Current Experience    Examination-Activity Limitations Squat;Stairs;Locomotion Level;Stand    Examination-Participation Restrictions Shop;Community Activity;Yard Work    [employer] officer Evolving/Moderate complexity    Rehab Potential Good    PT Frequency 2x / week    PT Duration 12 weeks    PT Treatment/Interventions ADLs/Self Care Home Management;Canalith Repostioning;Electrical Stimulation;DME Instruction;Gait training;Stair training;Functional mobility training;Therapeutic activities;Patient/family education;Therapeutic exercise;Balance training;Neuromuscular re-education;Energy conservation;Vestibular;Passive range of motion    PT Next Visit Plan Progress LE strength, Gait training - heel to toe sequencing, progress balance as appropriate.  Continue plan    PT Home Exercise Plan Seated L ankle DF; 3/10 Access Code: [email or web address]; 5/10:  Access Code: [email or web address]; no updates    Consulted and Agree with Plan of Care Patient              Patient will benefit from skilled therapeutic intervention in order to improve the following deficits and impairments:  Abnormal gait, Pain, Decreased mobility, Decreased activity tolerance, Decreased endurance, Decreased strength, Decreased balance, Difficulty walking  Visit Diagnosis: Other lack of coordination  Difficulty in walking, not elsewhere classified  Unsteadiness on feet  Muscle weakness (generalized)     Problem List Patient Active Problem List   Diagnosis Date Noted   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

## 2021-08-28 ENCOUNTER — Ambulatory Visit: Payer: Medicare PPO

## 2021-08-28 DIAGNOSIS — R278 Other lack of coordination: Secondary | ICD-10-CM

## 2021-08-28 DIAGNOSIS — R262 Difficulty in walking, not elsewhere classified: Secondary | ICD-10-CM

## 2021-08-28 DIAGNOSIS — M6281 Muscle weakness (generalized): Secondary | ICD-10-CM

## 2021-08-28 DIAGNOSIS — R2681 Unsteadiness on feet: Secondary | ICD-10-CM

## 2021-08-28 DIAGNOSIS — I63511 Cerebral infarction due to unspecified occlusion or stenosis of right middle cerebral artery: Secondary | ICD-10-CM

## 2021-08-28 NOTE — Therapy (Signed)
[place] [facility] SERVICES [address] [place], [place], [address] Phone: [phone]   Fax:  [phone]  Physical Therapy Treatment  Patient Details  Name: Teresa [name] MRN: [id number] Date of Birth: 1940-05-23 Referring Provider (PT): Aram Beecham, MD   Encounter Date: 08/28/2021   PT End of Session - 08/28/21 1435     Visit Number 27    Number of Visits 40    Date for PT Re-Evaluation 10/12/21    Authorization Type [employer] PPO-    Authorization Time Period cert 3/66/0065-11/23/9168    PT Start Time 1347    PT Stop Time 1430    PT Time Calculation (min) 43 min    Equipment Utilized During Treatment Gait belt    Activity Tolerance Patient tolerated treatment well    Behavior During Therapy [facility] for tasks assessed/performed             Past Medical History:  Diagnosis Date   Cancer ([facility])    skin   Hypothyroidism    Raynaud's disease     Past Surgical History:  Procedure Laterality Date   ABDOMINAL HYSTERECTOMY  1991   APPENDECTOMY  1991   BACK SURGERY  2010   Cervical fusion C4-5-6-7   CATARACT EXTRACTION, BILATERAL Bilateral 10/2016   CHOLECYSTECTOMY  1995   COLONOSCOPY WITH PROPOFOL N/A 12/08/2018   Procedure: COLONOSCOPY WITH PROPOFOL;  Surgeon: Toledo, Boykin Nearing, MD;  Location: [facility] ENDOSCOPY;  Service: Gastroenterology;  Laterality: N/A;   EYE SURGERY      There were no vitals filed for this visit.   Subjective Assessment - 08/28/21 1434     Subjective No changes since last appointment. No pain currently.    Pertinent History Pt is a 81 y.o. 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 Pt completed CIR from 03/02/21-03/22/21. PMH includes: skin cancer, hypothyroidism, and Raynaud's disease, history of cervical fusion in 2010 C4-C7.  Pt reports HH PT/OT/SLP was performed from January 2nd to February 28th with focus on ADL completion, ambulation. Pt's big goal tranisitioning to  ambulating up to 3 miles a day which pt was doing prior to stroke. Pt has been ambulating in [employer] with husband but last day or two pt has noticed increased L foot drag but states it is better today. Pt denies any falls at home or community. Pt reports balance deficits with turns primarily. States her balance is good with her quad cane. Wishing to return to full independence in ambulation with household and community ambulation. Wishing to be able to asc/desc full flight of stairs with ability to carry decorative items and other household items without need for handrail and/or AD.    Limitations Lifting;Standing;Walking;House hold activities    How long can you sit comfortably? unlimited    How long can you stand comfortably? unlimited    How long can you walk comfortably? ~ 1.5 hours    Patient Stated Goals Return to independent walking for 3 miles. Return to to crochet.    Currently in Pain? No/denies    Pain Onset More than a month ago    Pain Onset In the past 7 days                 Interventions- gait belt donned and CGA provided unless otherwise specified  NMR: Ambulating in hallway, reading sticky notes to incorporate vertical and horizontal head turns 2x without AD. VC to utilize cervical rotation.  Ambulating in hallway, with vertical head turns 2x without AD.  Amb. outside with and without SPC over grass, pine straw, up/down slope, up small curb, cuing for pt to perform with head turns. No LOB   Obstacle course: multiple cones on floor, agility ladder, pt instructed to ambulate around cones and to ambulate through agility ladder to promote turning and scanning for obstacles.              -progressed to use of dual cog and motor task while completing obstacle course  Airex WBOS EC 2x30 sec  Therex Treadmill: Ambulating for endurance on treadmill 1.2-1.4 mph at 0-1% elevation x 3 min. Discontinued per pt request due to fatigue.   Pt educated throughout session about proper  posture and technique with exercises. Improved exercise technique, movement at target joints, use of target muscles after min to mod verbal, visual, tactile cues.   Rationale for Evaluation and Treatment Rehabilitation      PT Education - 08/28/21 1435     Education Details exercise technique, body mechanics    Person(s) Educated Patient    Methods Explanation;Verbal cues;Demonstration    Comprehension Verbalized understanding;Returned demonstration;Verbal cues required;Need further instruction              PT Short Term Goals - 08/03/21 1130       PT SHORT TERM GOAL #1   Title Pt will be indep with HEP to improve balance, strength, and gait to optimize independence with ADL completion    Baseline 05/24/21: Initiated 4/7: to be advanced; 5/12: to be advanced    Time 6    Period Weeks    Status On-going    Target Date 09/14/21               PT Long Term Goals - 08/17/21 1010       PT LONG TERM GOAL #1   Title Pt will improve FOTO to target score to 67 demonstrate clinically significant improvement in functional mobility    Baseline 05/24/21: next session 4/7: 64; 5/12: 70; 5/26: 72%    Time 12    Period Weeks    Status Achieved    Target Date 08/02/21      PT LONG TERM GOAL #2   Title Pt will improve 5xSTS to 12 sec or less to indicate clinically significant improvement in LE strength    Baseline 05/24/21: 17.51 sec 4/7: 14.2 sec hands-free; 5/12: 10.3 sec hands-free    Time 12    Period Weeks    Status Achieved    Target Date 08/16/21      PT LONG TERM GOAL #3   Title Pt will improve 6 MWT by 165' to indicate clinically significant improvement in community ambulation distance for geriatric stroke norms.    Baseline 05/24/21: 765' 4/6: 842 ft with SPC; 5/12: 1060 ft without AD    Time 12    Period Weeks    Status Achieved    Target Date 08/16/21      PT LONG TERM GOAL #4   Title Pt will improve 10 m gait speed to at least 1.0 m/s to demonstrate reduced risk of  falls with community ambulation tasks.    Baseline 05/24/21: .63 m/s without AD 4/6: 0.83 without an AD; 5/12: 0.93 m/s; 5/26: 1.16 m/s    Time 12    Period Weeks    Status Achieved    Target Date 08/16/21      PT LONG TERM GOAL #5  Title Pt will improve FGA  by at least 5 point to demonstrate clinically signifcant reduced risk of falls with dynamic balance/ambulation tasks.    Baseline 05/24/21: 17; 4/7: 22    Time 12    Period Weeks    Status Achieved    Target Date 08/16/21      Additional Long Term Goals   Additional Long Term Goals Yes      PT LONG TERM GOAL #6   Title Pt will improve 6 MWT by 165' to indicate clinically significant improvement in community ambulation distance for geriatric stroke norms.    Baseline 5/12: 5/12: 1060 ft without AD; 5/26: 1178 ft no AD    Time 8    Period Weeks    Status Partially Met    Target Date 10/12/21      PT LONG TERM GOAL #7   Title Patient will tolerate 5 seconds of single leg stance bilat without loss of balance to improve ability to get in and out of shower safely.    Baseline 5/12: 3-4 sec bilat; 5/26: L 21 seconds, R 11 seconds    Time 12    Period Weeks    Status Achieved    Target Date 10/26/21      PT LONG TERM GOAL #8   Title Pt will report a 50% improvement in steadiness with turning to walk into and out of rooms in her home in order to reduce fall risk.    Baseline 5/26: pt reports unsteadiness 75% of the time    Time 8    Period Weeks    Status New    Target Date 10/12/21                   Plan - 08/28/21 1437     Clinical Impression Statement Pt shows improvement by ambulating over various surfaces (firm and compliant) outside with and without her AD and with no more than CGA. Pt able to complete this without LOB. Pt also completes cone obstacle course with only 2-3 instances of knocking over cones. Pt most challenged with endurance for long-distance ambulation. One of patient's personal goals is to return  to her 3 mile walks. The pt will benefit from further skilled PT to improve strength, gait, balance and mobility to increase QOL and decrease fall risk.    Personal Factors and Comorbidities Age;Time since onset of injury/illness/exacerbation;Comorbidity 3+;Past/Current Experience    Examination-Activity Limitations Squat;Stairs;Locomotion Level;Stand    Examination-Participation Restrictions Shop;Community Activity;Yard Work    [employer]chant [employer] officer Evolving/Moderate complexity    Rehab Potential Good    PT Frequency 2x / week    PT Duration 12 weeks    PT Treatment/Interventions ADLs/Self Care Home Management;Canalith Repostioning;Electrical Stimulation;DME Instruction;Gait training;Stair training;Functional mobility training;Therapeutic activities;Patient/family education;Therapeutic exercise;Balance training;Neuromuscular re-education;Energy conservation;Vestibular;Passive range of motion    PT Next Visit Plan Progress LE strength, Gait training - heel to toe sequencing, progress balance as appropriate. Continue plan    PT Home Exercise Plan Seated L ankle DF; 3/10 Access Code: [email or web address]; 5/10:  Access Code: [email or web address]; no updates    Consulted and Agree with Plan of Care Patient             Patient will benefit from skilled therapeutic intervention in order to improve the following deficits and impairments:  Abnormal gait, Pain, Decreased mobility, Decreased activity tolerance, Decreased endurance, Decreased strength, Decreased balance, Difficulty walking  Visit Diagnosis: Unsteadiness on feet  Muscle weakness (generalized)  Difficulty in walking, not  elsewhere classified     Problem List Patient Active Problem List   Diagnosis Date Noted   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 [place], [place], [address] Phone: [phone]   Fax:  [phone]  Name: Teresa [name] MRN: [id number] Date of Birth: 1940/04/11

## 2021-08-29 NOTE — Therapy (Signed)
[place] MAIN [facility] SERVICES [address] [place], [place], [address] Phone: [phone]   Fax:  [phone]  Occupational Therapy Treatment  Patient Details  Name: Teresa [name] MRN: [id number] Date of Birth: 27-Nov-1940 No data recorded  Encounter Date: 08/28/2021   OT End of Session - 08/28/21 1515     Visit Number 28    Number of Visits 48    Date for OT Re-Evaluation 11/08/21    OT Start Time 1300    OT Stop Time 1345    OT Time Calculation (min) 45 min    Activity Tolerance Patient tolerated treatment well    Behavior During Therapy [facility] for tasks assessed/performed             Past Medical History:  Diagnosis Date   Cancer ([place])    skin   Hypothyroidism    Raynaud's disease     Past Surgical History:  Procedure Laterality Date   [place]   BACK SURGERY  2010   Cervical fusion C4-5-6-7   CATARACT EXTRACTION, BILATERAL Bilateral 10/2016   CHOLECYSTECTOMY  1995   COLONOSCOPY WITH PROPOFOL N/A 12/08/2018   Procedure: COLONOSCOPY WITH PROPOFOL;  Surgeon: Toledo, Benay Pike, MD;  Location: [facility] ENDOSCOPY;  Service: Gastroenterology;  Laterality: N/A;   EYE SURGERY      There were no vitals filed for this visit.   Subjective Assessment - 08/28/21 1513     Subjective  Pt reports she bumped her left arm again and has some new bruises.    Pertinent History February 26, 2021, pt reports she had a CVA, came to [facility] to ER and then was transferred to [facility] in [place] where she was admitted and after acute care she went to inpatient rehab.  Following inpt rehab, pt went home and had home health.    Patient Stated Goals Pt reports her goal is to get back to normal, be as independent as possible.    Currently in Pain? Yes    Pain Score 3     Pain Location Neck    Pain Orientation Left    Pain Descriptors / Indicators Sore;Tightness    Pain Type Chronic pain    Pain  Radiating Towards upper trap, shoulder    Pain Onset More than a month ago    Pain Frequency Constant    Pain Relieving Factors heat, massage    Multiple Pain Sites Yes    Pain Onset In the past 7 days            Occupational Therapy Treatment: Therapeutic Exercise: Performed passive L wrist flex/ext, digit flex/ext all digits, and PROM/AAROM for L shoulder flex/abd/horiz abd/add, prolonged stretch for ER, working to increase joint flexibility for increased functional use of LUE.  Reviewed self passive stretching for L shoulder ER with elbow at side with good return demo and encouraged continued daily completion.  Completed 3 sets 10 reps on 1.5# digiflex on L hand, intermittent cues to engage all digits, alternated by passive digit extension.     Neuro re-ed: Facilitated FM and dexterity skills, picking up jumbo pegs from container and placing into pegboard.  Practiced storing and discarding 2-3 pegs in hand before discarding 1 at a time, cues for technique to minimize dropping.   Response to Treatment: Pt steadily increasing L hand grip, with good use of 1.5# digiflex today.  Pt is able to squeeze digiflex for 3 sets  10 reps at least 75% of full range on most reps.  L hand digit flexion, wrist ext, and L shoulder flex/abd/ER flexibility continue to improve.  Pt continues to respond well to moist heat for pain and muscle stiffness at neck and shoulders, alternating between L/R shoulders/upper traps while pt participated in therapeutic exercise and neuro re-ed noted above.  Pt will continue to benefit from skilled OT to work towards goals in plan of care to improve LUE functional use for ADL and IADL tasks   OT Education - 08/28/21 1514     Education Details L hand strengthening    Person(s) Educated Patient    Methods Explanation;Demonstration;Tactile cues;Verbal cues    Comprehension Verbalized understanding;Returned demonstration;Verbal cues required;Need further instruction;Tactile  cues required                 OT Long Term Goals - 08/17/21 0925       OT LONG TERM GOAL #1   Title Pt will be independent with home exercise program.    Baseline Eval: no current program, 10th visit:  continue to add new exercises as pt progresses, 20th:  continue to update HEP; 08/17/21: continue to progress HEP when indicated    Time 12    Period Weeks    Status On-going    Target Date 11/08/21      OT LONG TERM GOAL #2   Title Pt will complete UB and LB dressing with modified independence including buttons, snaps and zippers.    Baseline requires min assist at eval, 10th visit: occasional assist with buttons, 20th:  able to perform one handed, but difficulty with bilateral UE; 08/17/21: pt reports inconsistent with 1 hand, reviewed techniques this visit    Time 12    Period Weeks    Status On-going    Target Date 11/08/21      OT LONG TERM GOAL #3   Title Pt will perform shower transfer with modified independence.    Baseline Pt requires supervision to min assist for shower transfer at home. 10th visit: supervision; 08/17/21: supv    Time 6    Period Weeks    Status Partially Met    Target Date 09/27/21      OT LONG TERM GOAL #4   Title Pt will improve L hand grip by 10# to assist with holding items in left hand securely.    Baseline no grip in left hand at eval, 10th visit:  improved flexion but still working towards composite fisting and grip. 20th:  continues to demo decreased grip; 08/17/21: active digit flexion improving, but not yet able to register grip on dynamometer    Time 12    Period Weeks    Status On-going    Target Date 11/08/21      OT LONG TERM GOAL #5   Title Pt will improve left shoulder flexion to 100 degrees or better to improve reaching to obtain self care items from shelf/shoulder height.    Baseline difficulty with reach, shoulder flexion to 47 degrees; 08/17/21: L shoulder flexion 85, but not yet able to consistently hold ADL supplies in L hand  when reaching    Time 12    Period Weeks    Status Revised    Target Date 11/08/21      OT LONG TERM GOAL #6   Title Pt will improve FOTO score to 47 or above to demonstrate a clinically relevant change in function to impact ADL tasks.    Baseline  score of 30 at eval; 08/17/21: FOTO: 42    Time 12    Period Weeks    Status On-going    Target Date 11/08/21      OT LONG TERM GOAL #7   Title Pt will demonstrate ability to pick up small objects and complete 9 hole peg test in less than 2 mins.    Baseline unable to perform at eval, 10th visit: still unable to pick up small pegs, 20th: unable to pick up pegs but improving; 08/17/21: not attempted d/t time constraints, will assess next visit    Time 12    Period Weeks    Status On-going    Target Date 11/08/21              Plan - 08/28/21 0828     Clinical Impression Statement Pt steadily increasing L hand grip, with good use of 1.5# digiflex today.  Pt is able to squeeze digiflex for 3 sets 10 reps at least 75% of full range on most reps.  L hand digit flexion, wrist ext, and L shoulder flex/abd/ER flexibility continue to improve.  Pt continues to respond well to moist heat for pain and muscle stiffness at neck and shoulders, alternating between L/R shoulders/upper traps while pt participated in therapeutic exercise and neuro re-ed noted above.  Pt will continue to benefit from skilled OT to work towards goals in plan of care to improve LUE functional use for ADL and IADL tasks    OT Occupational Profile and History Detailed Assessment- Review of Records and additional review of physical, cognitive, psychosocial history related to current functional performance    Occupational performance deficits (Please refer to evaluation for details): ADL's;IADL's;Leisure;Rest and Sleep    Body Structure / Function / Physical Skills ADL;Coordination;Endurance;GMC;UE functional use;Balance;IADL;Pain;Dexterity;FMC;Strength;Edema;Mobility;ROM     Psychosocial Skills Environmental  Adaptations;Habits;Routines and Behaviors    Rehab Potential Good    Clinical Decision Making Several treatment options, min-mod task modification necessary    Comorbidities Affecting Occupational Performance: May have comorbidities impacting occupational performance    Modification or Assistance to Complete Evaluation  Min-Moderate modification of tasks or assist with assess necessary to complete eval    OT Frequency 2x / week    OT Duration 12 weeks    OT Treatment/Interventions Self-care/ADL training;Cryotherapy;Paraffin;Therapeutic exercise;DME and/or AE instruction;Functional Mobility Training;Balance training;Electrical Stimulation;Ultrasound;Neuromuscular education;Manual Therapy;Splinting;Moist Heat;Contrast Bath;Passive range of motion;Therapeutic activities;Patient/family education;Coping strategies training    Plan OT recert    Consulted and Agree with Plan of Care Patient             Patient will benefit from skilled therapeutic intervention in order to improve the following deficits and impairments:   Body Structure / Function / Physical Skills: ADL, Coordination, Endurance, GMC, UE functional use, Balance, IADL, Pain, Dexterity, FMC, Strength, Edema, Mobility, ROM   Psychosocial Skills: Environmental  Adaptations, Habits, Routines and Behaviors   Visit Diagnosis: Muscle weakness (generalized)  Other lack of coordination  Right middle cerebral artery stroke [facility])    Problem List Patient Active Problem List   Diagnosis Date Noted   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 [place], [place], [address] Phone: [phone]   Fax:  [phone]  Name: Teresa [name] MRN: [id number] Date of  Birth: 08-Oct-1940

## 2021-08-30 ENCOUNTER — Ambulatory Visit: Payer: Medicare PPO

## 2021-08-30 DIAGNOSIS — R278 Other lack of coordination: Secondary | ICD-10-CM | POA: Diagnosis not present

## 2021-08-30 DIAGNOSIS — I63511 Cerebral infarction due to unspecified occlusion or stenosis of right middle cerebral artery: Secondary | ICD-10-CM

## 2021-08-30 DIAGNOSIS — M6281 Muscle weakness (generalized): Secondary | ICD-10-CM

## 2021-08-31 ENCOUNTER — Ambulatory Visit: Payer: Medicare PPO

## 2021-08-31 NOTE — Therapy (Signed)
[place] MAIN [facility] SERVICES [address] [place], [place], [address] Phone: [phone]   Fax:  [phone]  Occupational Therapy Treatment  Patient Details  Name: Teresa [name] MRN: [id number] Date of Birth: [birth year] No data recorded  Encounter Date: 08/30/2021   OT End of Session - 08/31/21 1840     Visit Number 29    Number of Visits 48    Date for OT Re-Evaluation 11/08/21    OT Start Time 1345    OT Stop Time 1430    OT Time Calculation (min) 45 min    Activity Tolerance Patient tolerated treatment well    Behavior During Therapy [facility] for tasks assessed/performed             Past Medical History:  Diagnosis Date   Cancer ([place])    skin   Hypothyroidism    Raynaud's disease     Past Surgical History:  Procedure Laterality Date   [place]   BACK SURGERY  2010   Cervical fusion C4-5-6-7   CATARACT EXTRACTION, BILATERAL Bilateral 10/2016   CHOLECYSTECTOMY  1995   COLONOSCOPY WITH PROPOFOL N/A 12/08/2018   Procedure: COLONOSCOPY WITH PROPOFOL;  Surgeon: Toledo, Benay Pike, MD;  Location: [facility] ENDOSCOPY;  Service: Gastroenterology;  Laterality: N/A;   EYE SURGERY      There were no vitals filed for this visit.   Subjective Assessment - 08/30/21 1838     Subjective  Pt reports no new bruises today.    Pertinent History February 26, 2021, pt reports she had a CVA, came to [facility] to ER and then was transferred to [facility] in [place] where she was admitted and after acute care she went to inpatient rehab.  Following inpt rehab, pt went home and had home health.    Patient Stated Goals Pt reports her goal is to get back to normal, be as independent as possible.    Currently in Pain? Yes    Pain Score 2     Pain Location Neck    Pain Orientation Left;Right    Pain Descriptors / Indicators Sore;Tightness    Pain Type Chronic pain    Pain Radiating Towards upper  trap/shoulder    Pain Onset More than a month ago    Pain Frequency Constant    Pain Relieving Factors heat, massage    Effect of Pain on Daily Activities general discomfort    Multiple Pain Sites Yes    Pain Score 3    Pain Location Hip    Pain Orientation Right    Pain Descriptors / Indicators Aching    Pain Type Chronic pain    Pain Onset In the past 7 days    Pain Frequency Intermittent           Occupational Therapy Treatment: Therapeutic Exercise: Performed passive L wrist flex/ext, digit flex/ext all digits, and PROM/AAROM for L shoulder flex/abd/horiz abd/add, ER, active IR with bilat hands behind back x10 each, prolonged passive stretch for ER, working to increase joint flexibility for increased functional use of LUE.  Therapist provided manual resistance for L elbow flex/ext, forearm pron/sup, and wrist flex/ext x10 each.    Neuro re-ed: Worked with L hand using [place] board manipulatives to target pinching, pulling, twisting, rolling with items with resisted velcro to simulate functional hand skills.  Pt required supv/vc for technique and prehension patterns, and ongoing supv to scale activity up or down,  moving from heavier resistance with large velcro strip, to low resistance with thin velcro strip, to min A to keep manipulatives in a straight line moving up and down the board.    Response to Treatment: Pt continues to reports consistent attempts at engaging L arm into daily tasks.  Pt was pleased with her ability today to use the various EZ board tools and practiced grasping the longer roll/dowel with her L hand to simulate brushing L side of her head.  Pt fatigued with [place] board, but was able to complete 1 trial of each tool moving tool up and down the board for 1 full rep with supv-intermittent min A.  Pt will continue to benefit from skilled OT to work towards goals in plan of care to improve LUE functional use for ADL and IADL tasks    OT Education - 08/30/21 1839      Education Details L hand strengthening    Person(s) Educated Patient    Methods Explanation;Demonstration;Tactile cues;Verbal cues    Comprehension Verbalized understanding;Returned demonstration;Verbal cues required;Need further instruction;Tactile cues required                 OT Long Term Goals - 08/17/21 0925       OT LONG TERM GOAL #1   Title Pt will be independent with home exercise program.    Baseline Eval: no current program, 10th visit:  continue to add new exercises as pt progresses, 20th:  continue to update HEP; 08/17/21: continue to progress HEP when indicated    Time 12    Period Weeks    Status On-going    Target Date 11/08/21      OT LONG TERM GOAL #2   Title Pt will complete UB and LB dressing with modified independence including buttons, snaps and zippers.    Baseline requires min assist at eval, 10th visit: occasional assist with buttons, 20th:  able to perform one handed, but difficulty with bilateral UE; 08/17/21: pt reports inconsistent with 1 hand, reviewed techniques this visit    Time 12    Period Weeks    Status On-going    Target Date 11/08/21      OT LONG TERM GOAL #3   Title Pt will perform shower transfer with modified independence.    Baseline Pt requires supervision to min assist for shower transfer at home. 10th visit: supervision; 08/17/21: supv    Time 6    Period Weeks    Status Partially Met    Target Date 09/27/21      OT LONG TERM GOAL #4   Title Pt will improve L hand grip by 10# to assist with holding items in left hand securely.    Baseline no grip in left hand at eval, 10th visit:  improved flexion but still working towards composite fisting and grip. 20th:  continues to demo decreased grip; 08/17/21: active digit flexion improving, but not yet able to register grip on dynamometer    Time 12    Period Weeks    Status On-going    Target Date 11/08/21      OT LONG TERM GOAL #5   Title Pt will improve left shoulder flexion to 100  degrees or better to improve reaching to obtain self care items from shelf/shoulder height.    Baseline difficulty with reach, shoulder flexion to 47 degrees; 08/17/21: L shoulder flexion 85, but not yet able to consistently hold ADL supplies in L hand when reaching    Time  12    Period Weeks    Status Revised    Target Date 11/08/21      OT LONG TERM GOAL #6   Title Pt will improve FOTO score to 47 or above to demonstrate a clinically relevant change in function to impact ADL tasks.    Baseline score of 30 at eval; 08/17/21: FOTO: 42    Time 12    Period Weeks    Status On-going    Target Date 11/08/21      OT LONG TERM GOAL #7   Title Pt will demonstrate ability to pick up small objects and complete 9 hole peg test in less than 2 mins.    Baseline unable to perform at eval, 10th visit: still unable to pick up small pegs, 20th: unable to pick up pegs but improving; 08/17/21: not attempted d/t time constraints, will assess next visit    Time 12    Period Weeks    Status On-going    Target Date 11/08/21             Plan - 08/30/21 1857     Clinical Impression Statement Pt continues to reports consistent attempts at engaging L arm into daily tasks.  Pt was pleased with her ability today to use the various EZ board tools and practiced grasping the longer roll/dowel with her L hand to simulate brushing L side of her head.  Pt fatigued with [place] board, but was able to complete 1 trial of each tool moving tool up and down the board for 1 full rep with supv-intermittent min A.  Pt will continue to benefit from skilled OT to work towards goals in plan of care to improve LUE functional use for ADL and IADL tasks.    OT Occupational Profile and History Detailed Assessment- Review of Records and additional review of physical, cognitive, psychosocial history related to current functional performance    Occupational performance deficits (Please refer to evaluation for details):  ADL's;IADL's;Leisure;Rest and Sleep    Body Structure / Function / Physical Skills ADL;Coordination;Endurance;GMC;UE functional use;Balance;IADL;Pain;Dexterity;FMC;Strength;Edema;Mobility;ROM    Psychosocial Skills Environmental  Adaptations;Habits;Routines and Behaviors    Rehab Potential Good    Clinical Decision Making Several treatment options, min-mod task modification necessary    Comorbidities Affecting Occupational Performance: May have comorbidities impacting occupational performance    Modification or Assistance to Complete Evaluation  Min-Moderate modification of tasks or assist with assess necessary to complete eval    OT Frequency 2x / week    OT Duration 12 weeks    OT Treatment/Interventions Self-care/ADL training;Cryotherapy;Paraffin;Therapeutic exercise;DME and/or AE instruction;Functional Mobility Training;Balance training;Electrical Stimulation;Ultrasound;Neuromuscular education;Manual Therapy;Splinting;Moist Heat;Contrast Bath;Passive range of motion;Therapeutic activities;Patient/family education;Coping strategies training    Plan OT recert    Consulted and Agree with Plan of Care Patient             Patient will benefit from skilled therapeutic intervention in order to improve the following deficits and impairments:   Body Structure / Function / Physical Skills: ADL, Coordination, Endurance, GMC, UE functional use, Balance, IADL, Pain, Dexterity, FMC, Strength, Edema, Mobility, ROM   Psychosocial Skills: Environmental  Adaptations, Habits, Routines and Behaviors   Visit Diagnosis: Muscle weakness (generalized)  Other lack of coordination  Right middle cerebral artery stroke [facility])    Problem List Patient Active Problem List   Diagnosis Date Noted   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 [place], [place], [address] Phone: [phone]   Fax:  [phone]  Name: Jalayia Bagheri MRN: [id number] Date of Birth: Mar 31, 1940

## 2021-09-03 ENCOUNTER — Ambulatory Visit: Payer: Medicare PPO

## 2021-09-04 ENCOUNTER — Encounter: Payer: Self-pay | Admitting: Physical Therapy

## 2021-09-04 ENCOUNTER — Ambulatory Visit: Payer: Medicare PPO | Admitting: Physical Therapy

## 2021-09-04 ENCOUNTER — Ambulatory Visit: Payer: Medicare PPO

## 2021-09-04 DIAGNOSIS — R278 Other lack of coordination: Secondary | ICD-10-CM | POA: Diagnosis not present

## 2021-09-04 DIAGNOSIS — M6281 Muscle weakness (generalized): Secondary | ICD-10-CM

## 2021-09-04 DIAGNOSIS — R482 Apraxia: Secondary | ICD-10-CM

## 2021-09-04 DIAGNOSIS — R262 Difficulty in walking, not elsewhere classified: Secondary | ICD-10-CM

## 2021-09-04 DIAGNOSIS — R2681 Unsteadiness on feet: Secondary | ICD-10-CM

## 2021-09-04 DIAGNOSIS — I63511 Cerebral infarction due to unspecified occlusion or stenosis of right middle cerebral artery: Secondary | ICD-10-CM

## 2021-09-04 NOTE — Therapy (Signed)
[place] MAIN [facility] SERVICES [address] [place], [place], [address] Phone: [phone]   Fax:  [phone]  Physical Therapy Treatment  Patient Details  Name: Teresa [name] MRN: [id number] Date of Birth: [birth year] Referring Provider (PT): [name], MD   Encounter Date: 09/04/2021   PT End of Session - 09/04/21 1108     Visit Number 28    Number of Visits 40    Date for PT Re-Evaluation 10/12/21    Authorization Type [employer] PPO-    Authorization Time Period cert 5/85/2778-2/42/3536    PT Start Time 1105    PT Stop Time 1145    PT Time Calculation (min) 40 min    Equipment Utilized During Treatment Gait belt    Activity Tolerance Patient tolerated treatment well    Behavior During Therapy WFL for tasks assessed/performed             Past Medical History:  Diagnosis Date   Cancer ([place])    skin   Hypothyroidism    Raynaud's disease     Past Surgical History:  Procedure Laterality Date   [place]   BACK SURGERY  2010   Cervical fusion C4-5-6-7   CATARACT EXTRACTION, BILATERAL Bilateral 10/2016   CHOLECYSTECTOMY  1995   COLONOSCOPY WITH PROPOFOL N/A 12/08/2018   Procedure: COLONOSCOPY WITH PROPOFOL;  Surgeon: Toledo, Benay Pike, MD;  Location: [facility] ENDOSCOPY;  Service: Gastroenterology;  Laterality: N/A;   EYE SURGERY      There were no vitals filed for this visit.   Subjective Assessment - 09/04/21 1107     Subjective Pt reports doing well. She denies any new changes. Denies any new falls but does bump into doorways. She reports doing HEP daily;    Pertinent History Pt is a 81 y.o. 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 Pt completed CIR from 03/02/21-03/22/21. PMH includes: skin cancer, hypothyroidism, and Raynaud's disease, history of cervical fusion in 2010 C4-C7.  Pt reports HH PT/OT/SLP was performed from January 2nd to February 28th  with focus on ADL completion, ambulation. Pt's big goal tranisitioning to ambulating up to 3 miles a day which pt was doing prior to stroke. Pt has been ambulating in [employer] with husband but last day or two pt has noticed increased L foot drag but states it is better today. Pt denies any falls at home or community. Pt reports balance deficits with turns primarily. States her balance is good with her quad cane. Wishing to return to full independence in ambulation with household and community ambulation. Wishing to be able to asc/desc full flight of stairs with ability to carry decorative items and other household items without need for handrail and/or AD.    Limitations Lifting;Standing;Walking;House hold activities    How long can you sit comfortably? unlimited    How long can you stand comfortably? unlimited    How long can you walk comfortably? ~ 1.5 hours    Patient Stated Goals Return to independent walking for 3 miles. Return to to crochet.    Currently in Pain? No/denies    Pain Onset More than a month ago    Pain Onset In the past 7 days                 Interventions- gait belt donned and CGA provided unless otherwise specified   NMR: Ambulating in hallway, reading sticky notes to  incorporate vertical and horizontal head turns 1x without AD forward and backward . VC to utilize cervical rotation.    Side stepping on level surface x10 feet and reach across body to challenge weight shift x5 reps each direction;     Ladder drills: Forward SLS 3 sec hold x1 laps  Progressed to SLS with head turns side/side x1 lap, requiring min A with increased unsteadiness Coordination drill with out-out-in-in reciprocal stepping x2 laps with moderate difficulty often mis-stepping requiring cues for sequencing Side stepping while holding small ball, chest press when stepping out and then bringing ball to chest when stepping together to challenge dual task x1 lap each direction, close supervision to  CGA   Stepping over 1/2 bolster LLE only and then step together, unsupported x10 reps with cues for heel strike  Progressed to stepping over with arm lift for dual task UE/LE movement x10 reps;    Therex Treadmill: Ambulating for endurance on treadmill 1.5-1.7 interval training, 30 sec bouts mph at 1% elevation x 3.5 min x2 sets (at start and end of session). Total distance ambulated 0.18 miles Pt required cues to increase step length for better foot clearance;    Pt educated throughout session about proper posture and technique with exercises. Improved exercise technique, movement at target joints, use of target muscles after min to mod verbal, visual, tactile cues.   Rationale for Evaluation and Treatment Rehabilitation                           PT Education - 09/04/21 1108     Education Details exercise technique/environmental scanning;    Person(s) Educated Patient    Methods Explanation;Verbal cues    Comprehension Verbalized understanding;Returned demonstration;Verbal cues required;Need further instruction              PT Short Term Goals - 08/03/21 1130       PT SHORT TERM GOAL #1   Title Pt will be indep with HEP to improve balance, strength, and gait to optimize independence with ADL completion    Baseline 05/24/21: Initiated 4/7: to be advanced; 5/12: to be advanced    Time 6    Period Weeks    Status On-going    Target Date 09/14/21               PT Long Term Goals - 08/17/21 1010       PT LONG TERM GOAL #1   Title Pt will improve FOTO to target score to 67 demonstrate clinically significant improvement in functional mobility    Baseline 05/24/21: next session 4/7: 64; 5/12: 70; 5/26: 72%    Time 12    Period Weeks    Status Achieved    Target Date 08/02/21      PT LONG TERM GOAL #2   Title Pt will improve 5xSTS to 12 sec or less to indicate clinically significant improvement in LE strength    Baseline 05/24/21: 17.51 sec 4/7:  14.2 sec hands-free; 5/12: 10.3 sec hands-free    Time 12    Period Weeks    Status Achieved    Target Date 08/16/21      PT LONG TERM GOAL #3   Title Pt will improve 6 MWT by 165' to indicate clinically significant improvement in community ambulation distance for geriatric stroke norms.    Baseline 05/24/21: 765' 4/6: 842 ft with SPC; 5/12: 1060 ft without AD    Time 12      Period Weeks    Status Achieved    Target Date 08/16/21      PT LONG TERM GOAL #4   Title Pt will improve 10 m gait speed to at least 1.0 m/s to demonstrate reduced risk of falls with community ambulation tasks.    Baseline 05/24/21: .63 m/s without AD 4/6: 0.83 without an AD; 5/12: 0.93 m/s; 5/26: 1.16 m/s    Time 12    Period Weeks    Status Achieved    Target Date 08/16/21      PT LONG TERM GOAL #5   Title Pt will improve FGA  by at least 5 point to demonstrate clinically signifcant reduced risk of falls with dynamic balance/ambulation tasks.    Baseline 05/24/21: 17; 4/7: 22    Time 12    Period Weeks    Status Achieved    Target Date 08/16/21      Additional Long Term Goals   Additional Long Term Goals Yes      PT LONG TERM GOAL #6   Title Pt will improve 6 MWT by 165' to indicate clinically significant improvement in community ambulation distance for geriatric stroke norms.    Baseline 5/12: 5/12: 1060 ft without AD; 5/26: 1178 ft no AD    Time 8    Period Weeks    Status Partially Met    Target Date 10/12/21      PT LONG TERM GOAL #7   Title Patient will tolerate 5 seconds of single leg stance bilat without loss of balance to improve ability to get in and out of shower safely.    Baseline 5/12: 3-4 sec bilat; 5/26: L 21 seconds, R 11 seconds    Time 12    Period Weeks    Status Achieved    Target Date 10/26/21      PT LONG TERM GOAL #8   Title Pt will report a 50% improvement in steadiness with turning to walk into and out of rooms in her home in order to reduce fall risk.    Baseline 5/26: pt  reports unsteadiness 75% of the time    Time 8    Period Weeks    Status New    Target Date 10/12/21                   Plan - 09/04/21 1200     Clinical Impression Statement Patient motivated and participated well within session. She exhibits increased LLE foot drag during session which was alleviated with increased exercise. Patient was challenged with dual task and coordination exercise. She required increased cues for sequencing. Patient had significant difficulty with backward walking with lateral head turns reporting increased dizziness. She required CGA to min A throughout session. She would benefit from additional skilled PT Intervention to improve strength, balance and mobility; Pt heavily fatigued after walking on treadmill. Will continue to address conditioning to improve self goals of increased long distance walking;    Personal Factors and Comorbidities Age;Time since onset of injury/illness/exacerbation;Comorbidity 3+;Past/Current Experience    Examination-Activity Limitations Squat;Stairs;Locomotion Level;Stand    Examination-Participation Restrictions Shop;Community Activity;Yard Work    [employer] Evolving/Moderate complexity    Rehab Potential Good    PT Frequency 2x / week    PT Duration 12 weeks    PT Treatment/Interventions ADLs/Self Care Home Management;Canalith Repostioning;Electrical Stimulation;DME Instruction;Gait training;Stair training;Functional mobility training;Therapeutic activities;Patient/family education;Therapeutic exercise;Balance training;Neuromuscular re-education;Energy conservation;Vestibular;Passive range of motion    PT Next Visit Plan Progress LE  strength, Gait training - heel to toe sequencing, progress balance as appropriate. Continue plan    PT Home Exercise Plan Seated L ankle DF; 3/10 Access Code: [email or web address]; 5/10:  Access Code: [email or web address]; no updates    Consulted and Agree with Plan of Care Patient              Patient will benefit from skilled therapeutic intervention in order to improve the following deficits and impairments:  Abnormal gait, Pain, Decreased mobility, Decreased activity tolerance, Decreased endurance, Decreased strength, Decreased balance, Difficulty walking  Visit Diagnosis: Muscle weakness (generalized)  Other lack of coordination  Unsteadiness on feet  Difficulty in walking, not elsewhere classified  Apraxia     Problem List Patient Active Problem List   Diagnosis Date Noted   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 [place], [place], [address] Phone: [phone]   Fax:  [phone]  Name: Teresa [name] MRN: [id number] Date of Birth: Feb 07, 1941

## 2021-09-05 NOTE — Progress Notes (Signed)
Carelink Summary Report / Loop Recorder 

## 2021-09-05 NOTE — Therapy (Signed)
[place] MAIN [facility] SERVICES [address] [place], [place], [address] Phone: [phone]   Fax:  [phone]  Occupational Therapy Progress note Reporting period beginning 07/31/21-09/04/21  Patient Details  Name: Teresa [name] MRN: [id number] Date of Birth: January 06, 1941 No data recorded  Encounter Date: 09/04/2021   OT End of Session - 09/05/21 1228     Visit Number 30    Number of Visits 48    Date for OT Re-Evaluation 11/08/21    OT Start Time 3267    OT Stop Time 1230    OT Time Calculation (min) 45 min    Activity Tolerance Patient tolerated treatment well    Behavior During Therapy [facility] for tasks assessed/performed             Past Medical History:  Diagnosis Date   Cancer ([place])    skin   Hypothyroidism    Raynaud's disease     Past Surgical History:  Procedure Laterality Date   [place]   BACK SURGERY  2010   Cervical fusion C4-5-6-7   CATARACT EXTRACTION, BILATERAL Bilateral 10/2016   CHOLECYSTECTOMY  1995   COLONOSCOPY WITH PROPOFOL N/A 12/08/2018   Procedure: COLONOSCOPY WITH PROPOFOL;  Surgeon: Toledo, Benay Pike, MD;  Location: [facility] END[facility]COPY;  Service: Gastroenterology;  Laterality: N/A;   EYE SURGERY      There were no vitals filed for this visit.   Subjective Assessment - 09/04/21 1227     Subjective  Pt reports doing well today.    Pertinent History February 26, 2021, pt reports she had a CVA, came to [facility] to ER and then was transferred to [facility] in [place] where she was admitted and after acute care she went to inpatient rehab.  Following inpt rehab, pt went home and had home health.    Patient Stated Goals Pt reports her goal is to get back to normal, be as independent as possible.    Currently in Pain? No/denies    Pain Score 0-No pain    Pain Onset More than a month ago    Pain Onset In the past 7 days                [facility] OT  Assessment - 09/05/21 0001       Observation/Other Assessments   Focus on Therapeutic Outcomes (FOTO)  54      Coordination   Right 9 Hole Peg Test 26 sec    Left 9 Hole Peg Test 5 min, 53 sec      AROM   Overall AROM Comments L shoulder flex 85 flex, abd 75, elbow flex 135, ext -10, wrist flex 58, ext 41      Hand Function   Right Hand Grip (lbs) 48    Right Hand Lateral Pinch 12 lbs    Right Hand 3 Point Pinch 16 lbs    Left Hand Grip (lbs) 1    Left Hand Lateral Pinch 4 lbs    Left 3 point pinch 4 lbs            Occupational Therapy Treatment: Therapeutic Exercise: Performed passive L wrist flex/ext, digit flex/ext all digits, and PROM/AAROM for L shoulder flex/abd/horiz abd/add, ER, active IR with bilat hands behind back x10 each, prolonged passive stretch for ER, working to increase joint flexibility for increased functional use of LUE.  Objective measures taken and goals updated, see note for details.  Response to Treatment: Pt continues to reports consistent attempts at engaging L arm into daily tasks.  L wrist extension, grip, ER, and abd are improving; OT provided encouragement to start making attempts at holding the shower hose in her L hand to rinse her hair, practice combing hair with L despite being R hand dominant in order to gain the ROM needed for working toward blow drying and curling hair.  Pt verbalized understanding.  Pt's FOTO score has improved to 54, up from 42 at last assessment period.  Pt was able to successfully complete 9 hole peg test today for the first time; see note for details.  Pt shows improvement in L grip, lateral and 2 point pinch, though still very limited with hand strength as stiffness is still present.  Pt will continue to benefit from skilled OT to work towards goals in plan of care to improve LUE functional use for ADL and IADL tasks      OT Education - 09/04/21 1228     Education Details Progress towards OT goals    Person(s)  Educated Patient    Methods Explanation;Verbal cues    Comprehension Verbalized understanding                 OT Long Term Goals - 09/04/21 1243       OT LONG TERM GOAL #1   Title Pt will be independent with home exercise program.    Baseline Eval: no current program, 10th visit:  continue to add new exercises as pt progresses, 20th:  continue to update HEP; 08/17/21: continue to progress HEP when indicated    Time 12    Period Weeks    Status On-going    Target Date 11/08/21      OT LONG TERM GOAL #2   Title Pt will complete UB and LB dressing with modified independence including buttons, snaps and zippers.    Baseline requires min assist at eval, 10th visit: occasional assist with buttons, 20th:  able to perform one handed, but difficulty with bilateral UE; 08/17/21: pt reports inconsistent with 1 hand, reviewed techniques this visit    Time 12    Period Weeks    Status On-going    Target Date 11/08/21      OT LONG TERM GOAL #3   Title Pt will perform shower transfer with modified independence.    Baseline Pt requires supervision to min assist for shower transfer at home. 10th visit: supervision; 08/17/21: supv    Time 6    Period Weeks    Status Partially Met    Target Date 09/27/21      OT LONG TERM GOAL #4   Title Pt will improve L hand grip by 10# to assist with holding items in left hand securely.    Baseline no grip in left hand at eval, 10th visit:  improved flexion but still working towards composite fisting and grip. 20th:  continues to demo decreased grip; 08/17/21: active digit flexion improving, but not yet able to register grip on dynamometer; 09/04/21: L grip 1#    Time 12    Period Weeks    Status On-going    Target Date 11/08/21      OT LONG TERM GOAL #5   Title Pt will improve left shoulder flexion to 100 degrees or better to improve reaching to obtain self care items from shelf/shoulder height.    Baseline difficulty with reach, shoulder flexion to 47  degrees; 08/17/21: L shoulder flexion  85, but not yet able to consistently hold ADL supplies in L hand when reaching; 09/04/21: flexion 85    Time 12    Period Weeks    Status On-going    Target Date 11/08/21      OT LONG TERM GOAL #6   Title Pt will improve FOTO score to 47 or above to demonstrate a clinically relevant change in function to impact ADL tasks.    Baseline score of 30 at eval; 08/17/21: FOTO: 42; 09/04/21: FOTO : 54    Time 12    Period Weeks    Status On-going    Target Date 11/08/21      OT LONG TERM GOAL #7   Title Pt will demonstrate ability to pick up small objects and complete 9 hole peg test in less than 2 mins.    Baseline unable to perform at eval, 10th visit: still unable to pick up small pegs, 20th: unable to pick up pegs but improving; 08/17/21: not attempted d/t time constraints, will assess next visit; 09/04/21: 9 hole peg test in 5 min 53 sec    Time 12    Period Weeks    Status On-going    Target Date 11/08/21                   Plan - 09/04/21 1253     Clinical Impression Statement Pt continues to reports consistent attempts at engaging L arm into daily tasks.  L wrist extension, grip, ER, and abd are improving; OT provided encouragement to start making attempts at holding the shower hose in her L hand to rinse her hair, practice combing hair with L despite being R hand dominant in order to gain the ROM needed for working toward blow drying and curling hair.  Pt verbalized understanding.  Pt's FOTO score has improved to 54, up from 42 at last assessment period.  Pt was able to successfully complete 9 hole peg test today for the first time; see note for details.  Pt shows improvement in L grip, lateral and 2 point pinch, though still very limited with hand strength as stiffness is still present.  Pt will continue to benefit from skilled OT to work towards goals in plan of care to improve LUE functional use for ADL and IADL tasks.    OT Occupational Profile  and History Detailed Assessment- Review of Records and additional review of physical, cognitive, psychosocial history related to current functional performance    Occupational performance deficits (Please refer to evaluation for details): ADL's;IADL's;Leisure;Rest and Sleep    Body Structure / Function / Physical Skills ADL;Coordination;Endurance;GMC;UE functional use;Balance;IADL;Pain;Dexterity;FMC;Strength;Edema;Mobility;ROM    Psychosocial Skills Environmental  Adaptations;Habits;Routines and Behaviors    Rehab Potential Good    Clinical Decision Making Several treatment options, min-mod task modification necessary    Comorbidities Affecting Occupational Performance: May have comorbidities impacting occupational performance    Modification or Assistance to Complete Evaluation  Min-Moderate modification of tasks or assist with assess necessary to complete eval    OT Frequency 2x / week    OT Duration 12 weeks    OT Treatment/Interventions Self-care/ADL training;Cryotherapy;Paraffin;Therapeutic exercise;DME and/or AE instruction;Functional Mobility Training;Balance training;Electrical Stimulation;Ultrasound;Neuromuscular education;Manual Therapy;Splinting;Moist Heat;Contrast Bath;Passive range of motion;Therapeutic activities;Patient/family education;Coping strategies training    Plan OT recert    Consulted and Agree with Plan of Care Patient             Patient will benefit from skilled therapeutic intervention in order to improve the following deficits  and impairments:   Body Structure / Function / Physical Skills: ADL, Coordination, Endurance, GMC, UE functional use, Balance, IADL, Pain, Dexterity, FMC, Strength, Edema, Mobility, ROM   Psychosocial Skills: Environmental  Adaptations, Habits, Routines and Behaviors   Visit Diagnosis: Muscle weakness (generalized)  Other lack of coordination  Right middle cerebral artery stroke [facility])    Problem List Patient Active Problem List    Diagnosis Date Noted   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

## 2021-09-06 ENCOUNTER — Ambulatory Visit: Payer: Medicare PPO

## 2021-09-06 ENCOUNTER — Ambulatory Visit: Payer: Medicare PPO | Admitting: Occupational Therapy

## 2021-09-06 ENCOUNTER — Encounter: Payer: Self-pay | Admitting: Occupational Therapy

## 2021-09-06 DIAGNOSIS — R2681 Unsteadiness on feet: Secondary | ICD-10-CM

## 2021-09-06 DIAGNOSIS — M6281 Muscle weakness (generalized): Secondary | ICD-10-CM

## 2021-09-06 DIAGNOSIS — I63511 Cerebral infarction due to unspecified occlusion or stenosis of right middle cerebral artery: Secondary | ICD-10-CM

## 2021-09-06 DIAGNOSIS — R278 Other lack of coordination: Secondary | ICD-10-CM | POA: Diagnosis not present

## 2021-09-06 DIAGNOSIS — R262 Difficulty in walking, not elsewhere classified: Secondary | ICD-10-CM

## 2021-09-06 NOTE — Therapy (Signed)
[place] MAIN [facility] SERVICES [address] [place], [place], [address] Phone: [phone]   Fax:  [phone]  Occupational Therapy Treatment  Patient Details  Name: Teresa [name] MRN: [id number] Date of Birth: Aug 12, 1940 No data recorded  Encounter Date: 09/06/2021   OT End of Session - 09/06/21 1027     Visit Number 31    Number of Visits 48    Date for OT Re-Evaluation 11/08/21    OT Start Time 1020    OT Stop Time 1106    OT Time Calculation (min) 46 min    Activity Tolerance Patient tolerated treatment well    Behavior During Therapy [facility] for tasks assessed/performed             Past Medical History:  Diagnosis Date   Cancer ([place])    skin   Hypothyroidism    Raynaud's disease     Past Surgical History:  Procedure Laterality Date   [place]   BACK SURGERY  2010   Cervical fusion C4-5-6-7   CATARACT EXTRACTION, BILATERAL Bilateral 10/2016   CHOLECYSTECTOMY  1995   COLONOSCOPY WITH PROPOFOL N/A 12/08/2018   Procedure: COLONOSCOPY WITH PROPOFOL;  Surgeon: Toledo, Benay Pike, MD;  Location: [facility] ENDOSCOPY;  Service: Gastroenterology;  Laterality: N/A;   EYE SURGERY      There were no vitals filed for this visit.   Subjective Assessment - 09/06/21 1024     Subjective  Pt reports she has bilateral shoulder pain.    Pertinent History February 26, 2021, pt reports she had a CVA, came to [facility] to ER and then was transferred to [facility] in [place] where she was admitted and after acute care she went to inpatient rehab.  Following inpt rehab, pt went home and had home health.    Patient Stated Goals Pt reports her goal is to get back to normal, be as independent as possible.    Currently in Pain? Yes    Pain Score 3     Pain Location Shoulder    Pain Orientation Right;Left    Pain Descriptors / Indicators Aching;Sore    Pain Type Chronic pain    Pain Onset More than a  month ago    Pain Frequency Constant           Rationale for Evaluation and Treatment Rehabilitation   Pt is proud she was able to finally do the peg test last time for the first time since starting therapy.  "It is still really hard".  Reports she has pain in the left upper arm and bicep area, 3/10 this date.  States "I feel like both arms are really sore from holding on to the treadmill last time."  Manual therapy:  Soft tissue massage to left bicep and upper arm with additional focus on pectoralis muscles with manual stretch to decrease tightness.   Therapeutic Exercises: Pt seen for AAROM of LUE for shoulder flexion to 80-85 degrees, ABD to 85 and ER with prolonged stretching.  Ball on table stretches, placing hand on top of ball on tabletop with fingers ABD and open palm, moving forwards and backwards, side to side and then circular motions in both directions with hand over hand assist from therapist.  Resistive Velcro board for left wrist ROM and stretching with use of resistive velcro large dowel, 2 reps up and back.  Cues at times for gripping patterns.  Resistive Velcro finger extension,  lateral pinch with large barrel, therapist guiding and assist as needed for proper form and technique.   Large jumbo wooden pegs place and remove, able to retrieve out of bucket today.   Response to tx:   Pt continues to be limited by pain in left UE around bicep and upper arm, tenderness left upper extremity mid shaft.  Pt continues to progress in all areas, mild edema remains in left hand at times but responds well to use of compression glove, massage and exercises.  Edema increases if patient has her UE in a dependent position for any length of time.  Pt's grasping patterns improving and now able to retrieve larger objects from bucket rather than having therapist hand her items.  Continue to work towards goals to improve LUE pain, ROM, strength and functional use for ADL and IADL tasks.  She is planning  to follow up next week with her MD.                       OT Education - 09/06/21 1026     Education Details ROM, exercises, tabletop    Person(s) Educated Patient    Methods Explanation;Verbal cues    Comprehension Verbalized understanding                 OT Long Term Goals - 09/04/21 1243       OT LONG TERM GOAL #1   Title Pt will be independent with home exercise program.    Baseline Eval: no current program, 10th visit:  continue to add new exercises as pt progresses, 20th:  continue to update HEP; 08/17/21: continue to progress HEP when indicated    Time 12    Period Weeks    Status On-going    Target Date 11/08/21      OT LONG TERM GOAL #2   Title Pt will complete UB and LB dressing with modified independence including buttons, snaps and zippers.    Baseline requires min assist at eval, 10th visit: occasional assist with buttons, 20th:  able to perform one handed, but difficulty with bilateral UE; 08/17/21: pt reports inconsistent with 1 hand, reviewed techniques this visit    Time 12    Period Weeks    Status On-going    Target Date 11/08/21      OT LONG TERM GOAL #3   Title Pt will perform shower transfer with modified independence.    Baseline Pt requires supervision to min assist for shower transfer at home. 10th visit: supervision; 08/17/21: supv    Time 6    Period Weeks    Status Partially Met    Target Date 09/27/21      OT LONG TERM GOAL #4   Title Pt will improve L hand grip by 10# to assist with holding items in left hand securely.    Baseline no grip in left hand at eval, 10th visit:  improved flexion but still working towards composite fisting and grip. 20th:  continues to demo decreased grip; 08/17/21: active digit flexion improving, but not yet able to register grip on dynamometer; 09/04/21: L grip 1#    Time 12    Period Weeks    Status On-going    Target Date 11/08/21      OT LONG TERM GOAL #5   Title Pt will improve left  shoulder flexion to 100 degrees or better to improve reaching to obtain self care items from shelf/shoulder height.    Baseline  difficulty with reach, shoulder flexion to 47 degrees; 08/17/21: L shoulder flexion 85, but not yet able to consistently hold ADL supplies in L hand when reaching; 09/04/21: flexion 85    Time 12    Period Weeks    Status On-going    Target Date 11/08/21      OT LONG TERM GOAL #6   Title Pt will improve FOTO score to 47 or above to demonstrate a clinically relevant change in function to impact ADL tasks.    Baseline score of 30 at eval; 08/17/21: FOTO: 42; 09/04/21: FOTO : 54    Time 12    Period Weeks    Status On-going    Target Date 11/08/21      OT LONG TERM GOAL #7   Title Pt will demonstrate ability to pick up small objects and complete 9 hole peg test in less than 2 mins.    Baseline unable to perform at eval, 10th visit: still unable to pick up small pegs, 20th: unable to pick up pegs but improving; 08/17/21: not attempted d/t time constraints, will assess next visit; 09/04/21: 9 hole peg test in 5 min 53 sec    Time 12    Period Weeks    Status On-going    Target Date 11/08/21                    Patient will benefit from skilled therapeutic intervention in order to improve the following deficits and impairments:           Visit Diagnosis: Muscle weakness (generalized)  Other lack of coordination  Right middle cerebral artery stroke (HCC)  Unsteadiness on feet    Problem List Patient Active Problem List   Diagnosis Date Noted   Right middle cerebral artery stroke ([place]) 03/01/2021   Stroke ([place]) 02/27/2021   History of nonmelanoma skin cancer 05/23/2014   Judd Mccubbin Oneita Jolly, OTR/L, CLT  Orva Gwaltney, OT 09/06/2021, 11:37 AM  [place] [address] [place], [place], [address] Phone: [phone]   Fax:  [phone]  Name: Marquisha Nikolov MRN: [id number] Date of Birth:  04-22-1940

## 2021-09-06 NOTE — Therapy (Signed)
[place] MAIN [facility] SERVICES [address] [place], [place], [address] Phone: [phone]   Fax:  [phone]  Physical Therapy Treatment  Patient Details  Name: Teresa [name] MRN: [id number] Date of Birth: June 10, 1940 Referring Provider (PT): [name], MD   Encounter Date: 09/06/2021   PT End of Session - 09/06/21 1032     Visit Number 29    Number of Visits 40    Date for PT Re-Evaluation 10/12/21    Authorization Type [employer] PPO-    Authorization Time Period cert 5/62/5638-9/37/3428    PT Start Time 0930    PT Stop Time 1014    PT Time Calculation (min) 44 min    Equipment Utilized During Treatment Gait belt    Activity Tolerance Patient tolerated treatment well    Behavior During Therapy [facility] for tasks assessed/performed             Past Medical History:  Diagnosis Date   Cancer ([place])    skin   Hypothyroidism    Raynaud's disease     Past Surgical History:  Procedure Laterality Date   [place]   BACK SURGERY  2010   Cervical fusion C4-5-6-7   CATARACT EXTRACTION, BILATERAL Bilateral 10/2016   CHOLECYSTECTOMY  1995   COLONOSCOPY WITH PROPOFOL N/A 12/08/2018   Procedure: COLONOSCOPY WITH PROPOFOL;  Surgeon: Toledo, Benay Pike, MD;  Location: [facility] ENDOSCOPY;  Service: Gastroenterology;  Laterality: N/A;   EYE SURGERY      There were no vitals filed for this visit.   Subjective Assessment - 09/06/21 0931     Subjective Pt reports "treadmill killed me" last session, BUE have been sore since. No other changes. No stumbles/falls. Pt reports one new bruise on L forarm from "hitting" doorknob walking into house from garage.    Patient is accompained by: Family member    Pertinent History Pt is a 81 y.o. 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 Pt completed CIR from 03/02/21-03/22/21. PMH includes: skin cancer, hypothyroidism, and Raynaud's  disease, history of cervical fusion in 2010 C4-C7.  Pt reports HH PT/OT/SLP was performed from January 2nd to February 28th with focus on ADL completion, ambulation. Pt's big goal tranisitioning to ambulating up to 3 miles a day which pt was doing prior to stroke. Pt has been ambulating in [employer] with husband but last day or two pt has noticed increased L foot drag but states it is better today. Pt denies any falls at home or community. Pt reports balance deficits with turns primarily. States her balance is good with her quad cane. Wishing to return to full independence in ambulation with household and community ambulation. Wishing to be able to asc/desc full flight of stairs with ability to carry decorative items and other household items without need for handrail and/or AD.    Limitations Lifting;Standing;Walking;House hold activities    How long can you sit comfortably? unlimited    How long can you stand comfortably? unlimited    How long can you walk comfortably? ~ 1.5 hours    Patient Stated Goals Return to independent walking for 3 miles. Return to to crochet.    Currently in Pain? No/denies    Pain Onset More than a month ago    Pain Onset In the past 7 days              INTERVENTION - gait belt  donned and CGA provided unless otherwise specified    Therex:  Treadmill: Ambulating for endurance on treadmill 0.4-0.7 mph x 5 min, intermittent UE support, limited speed to reduce UE support need due to pt's increased pain in BUE   NMR:  Airex: NBOS, EO: 30 sec NBOS, head turns: 30 sec each direction (vertical & horizontal) WBOS, EC: 2 x 30 sec Semi tandem, EC: 30 sec, mod sway, intermittent UE support to correct Semi tandem, EO: 30 sec each way  Ambulation in hallway, around cones and reading sticky notes to improve environmental scanning, 4x w/o AD, challenging for pt due to dual tasking   Ambulating in hallway, reading sticky notes to incorporate vertical and horizontal head  turns 1x without AD forward and backward . VC to utilize cervical rotation.    Stepping over hurdle, FWD/BKWD: 10x, w/o UE support, VC to avoid bringing foot around hurdle.  Stepping over hurdle, LTL: 10x each direction, occ UE support, VC for big initial step.   Ladder drills: - Coordination drill with out-out-in-in reciprocal stepping x 4 laps, mod difficulty with sequencing  Progressed to dual task of naming girl/boy names, 2 laps - Slow march, one foot in each, x 4, VC for speed of movement Progressed to dual task of naming girl/boy names, 2 laps    Pt educated throughout session about proper posture and technique with exercises. Improved exercise technique, movement at target joints, use of target muscles after min to mod verbal, visual, tactile cues.  Rationale for Evaluation and Treatment Rehabilitation  Teresa [name], SPT  This entire session was performed under direct supervision and direction of a licensed therapist. I have personally read, edited and approve of the note as written. Ricard Dillon PT, DPT     PT Education - 09/06/21 1032     Education Details exercise technique, body mechanics    Person(s) Educated Patient    Methods Explanation;Demonstration;Tactile cues;Verbal cues    Comprehension Verbalized understanding;Returned demonstration;Verbal cues required;Tactile cues required;Need further instruction              PT Short Term Goals - 08/03/21 1130       PT SHORT TERM GOAL #1   Title Pt will be indep with HEP to improve balance, strength, and gait to optimize independence with ADL completion    Baseline 05/24/21: Initiated 4/7: to be advanced; 5/12: to be advanced    Time 6    Period Weeks    Status On-going    Target Date 09/14/21               PT Long Term Goals - 08/17/21 1010       PT LONG TERM GOAL #1   Title Pt will improve FOTO to target score to 67 demonstrate clinically significant improvement in functional mobility    Baseline  05/24/21: next session 4/7: 64; 5/12: 70; 5/26: 72%    Time 12    Period Weeks    Status Achieved    Target Date 08/02/21      PT LONG TERM GOAL #2   Title Pt will improve 5xSTS to 12 sec or less to indicate clinically significant improvement in LE strength    Baseline 05/24/21: 17.51 sec 4/7: 14.2 sec hands-free; 5/12: 10.3 sec hands-free    Time 12    Period Weeks    Status Achieved    Target Date 08/16/21      PT LONG TERM GOAL #3   Title Pt will improve 6 MWT  by 15' to indicate clinically significant improvement in community ambulation distance for geriatric stroke norms.    Baseline 05/24/21: 765' 4/6: 842 ft with SPC; 5/12: 1060 ft without AD    Time 12    Period Weeks    Status Achieved    Target Date 08/16/21      PT LONG TERM GOAL #4   Title Pt will improve 10 m gait speed to at least 1.0 m/s to demonstrate reduced risk of falls with community ambulation tasks.    Baseline 05/24/21: .63 m/s without AD 4/6: 0.83 without an AD; 5/12: 0.93 m/s; 5/26: 1.16 m/s    Time 12    Period Weeks    Status Achieved    Target Date 08/16/21      PT LONG TERM GOAL #5   Title Pt will improve FGA  by at least 5 point to demonstrate clinically signifcant reduced risk of falls with dynamic balance/ambulation tasks.    Baseline 05/24/21: 17; 4/7: 22    Time 12    Period Weeks    Status Achieved    Target Date 08/16/21      Additional Long Term Goals   Additional Long Term Goals Yes      PT LONG TERM GOAL #6   Title Pt will improve 6 MWT by 165' to indicate clinically significant improvement in community ambulation distance for geriatric stroke norms.    Baseline 5/12: 5/12: 1060 ft without AD; 5/26: 1178 ft no AD    Time 8    Period Weeks    Status Partially Met    Target Date 10/12/21      PT LONG TERM GOAL #7   Title Patient will tolerate 5 seconds of single leg stance bilat without loss of balance to improve ability to get in and out of shower safely.    Baseline 5/12: 3-4 sec bilat;  5/26: L 21 seconds, R 11 seconds    Time 12    Period Weeks    Status Achieved    Target Date 10/26/21      PT LONG TERM GOAL #8   Title Pt will report a 50% improvement in steadiness with turning to walk into and out of rooms in her home in order to reduce fall risk.    Baseline 5/26: pt reports unsteadiness 75% of the time    Time 8    Period Weeks    Status New    Target Date 10/12/21                   Plan - 09/06/21 1033     Clinical Impression Statement Pt highly motivated and participated well within session. Decreased walking speed on treadmill today for safety secondary to reports of increase BUE soreness and desire to not use UE support when walking. Pt continues to be challenged with dual task and coordination exercises, required increased cues for sequencing. Pt would benefit from further skilled PT to improve strength, balance and mobility.    Personal Factors and Comorbidities Age;Time since onset of injury/illness/exacerbation;Comorbidity 3+;Past/Current Experience    Examination-Activity Limitations Squat;Stairs;Locomotion Level;Stand    Examination-Participation Restrictions Shop;Community Activity;Yard Work    Conservation officer, historic buildings Evolving/Moderate complexity    Rehab Potential Good    PT Frequency 2x / week    PT Duration 12 weeks    PT Treatment/Interventions ADLs/Self Care Home Management;Canalith Repostioning;Electrical Stimulation;DME Instruction;Gait training;Stair training;Functional mobility training;Therapeutic activities;Patient/family education;Therapeutic exercise;Balance training;Neuromuscular re-education;Energy conservation;Vestibular;Passive range of motion  PT Next Visit Plan Endurance, strengthening, balance, incorproation of dual tasks into exercises, continue with current POC    PT Home Exercise Plan Seated L ankle DF; 3/10 Access Code: [email or web address]; 5/10:  Access Code: [email or web address]; no updates    Consulted and Agree with Plan of  Care Patient             Patient will benefit from skilled therapeutic intervention in order to improve the following deficits and impairments:  Abnormal gait, Pain, Decreased mobility, Decreased activity tolerance, Decreased endurance, Decreased strength, Decreased balance, Difficulty walking  Visit Diagnosis: Unsteadiness on feet  Difficulty in walking, not elsewhere classified  Muscle weakness (generalized)  Other lack of coordination     Problem List Patient Active Problem List   Diagnosis Date Noted   Right middle cerebral artery stroke ([place]) 03/01/2021   Stroke ([place]) 02/27/2021   History of nonmelanoma skin cancer 05/23/2014    Teresa [name], Student-PT 09/06/2021, 12:16 PM  [place] MAIN [facility] SERVICES [address] [place], [place], [address] Phone: [phone]   Fax:  [phone]  Name: Teresa [name] MRN: [id number] Date of Birth: [birth year]

## 2021-09-10 ENCOUNTER — Ambulatory Visit: Payer: Medicare PPO | Admitting: Occupational Therapy

## 2021-09-10 ENCOUNTER — Ambulatory Visit: Payer: Medicare PPO

## 2021-09-10 ENCOUNTER — Encounter: Payer: Self-pay | Admitting: Occupational Therapy

## 2021-09-10 DIAGNOSIS — R278 Other lack of coordination: Secondary | ICD-10-CM | POA: Diagnosis not present

## 2021-09-10 DIAGNOSIS — R262 Difficulty in walking, not elsewhere classified: Secondary | ICD-10-CM

## 2021-09-10 DIAGNOSIS — M6281 Muscle weakness (generalized): Secondary | ICD-10-CM

## 2021-09-10 DIAGNOSIS — I63511 Cerebral infarction due to unspecified occlusion or stenosis of right middle cerebral artery: Secondary | ICD-10-CM

## 2021-09-10 DIAGNOSIS — R2681 Unsteadiness on feet: Secondary | ICD-10-CM

## 2021-09-10 NOTE — Therapy (Signed)
[place] MAIN [facility] SERVICES [address] [place], [place], [address] Phone: [phone]   Fax:  [phone]  Physical Therapy Treatment/Physical Therapy Progress Note   Dates of reporting period  08/03/2021   to   09/10/2021   Patient Details  Name: Teresa [name] MRN: [id number] Date of Birth: 03-Dec-1940 Referring Provider (PT): [name], MD   Encounter Date: 09/10/2021   PT End of Session - 09/10/21 1111     Visit Number 30    Number of Visits 40    Date for PT Re-Evaluation 10/12/21    Authorization Type [employer] PPO-    Authorization Time Period cert 3/33/8329-1/91/6606    PT Start Time 1016    PT Stop Time 1058    PT Time Calculation (min) 42 min    Equipment Utilized During Treatment Gait belt    Activity Tolerance Patient tolerated treatment well    Behavior During Therapy [facility] for tasks assessed/performed             Past Medical History:  Diagnosis Date   Cancer ([place])    skin   Hypothyroidism    Raynaud's disease     Past Surgical History:  Procedure Laterality Date   [place]   BACK SURGERY  2010   Cervical fusion C4-5-6-7   CATARACT EXTRACTION, BILATERAL Bilateral 10/2016   CHOLECYSTECTOMY  1995   COLONOSCOPY WITH PROPOFOL N/A 12/08/2018   Procedure: COLONOSCOPY WITH PROPOFOL;  Surgeon: Toledo, Benay Pike, MD;  Location: [facility] ENDOSCOPY;  Service: Gastroenterology;  Laterality: N/A;   EYE SURGERY      There were no vitals filed for this visit.   Subjective Assessment - 09/10/21 1016     Subjective Pt reports no pain, no stumbles/falls, but did sway into door handle when walking in her bathroom. Pt exhibits bruise on R wrist.    Patient is accompained by: Family member    Pertinent History Pt is a 81 y.o. 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 Pt completed CIR from 03/02/21-03/22/21. PMH includes: skin cancer, hypothyroidism,  and Raynaud's disease, history of cervical fusion in 2010 C4-C7.  Pt reports HH PT/OT/SLP was performed from January 2nd to February 28th with focus on ADL completion, ambulation. Pt's big goal tranisitioning to ambulating up to 3 miles a day which pt was doing prior to stroke. Pt has been ambulating in [employer] with husband but last day or two pt has noticed increased L foot drag but states it is better today. Pt denies any falls at home or community. Pt reports balance deficits with turns primarily. States her balance is good with her quad cane. Wishing to return to full independence in ambulation with household and community ambulation. Wishing to be able to asc/desc full flight of stairs with ability to carry decorative items and other household items without need for handrail and/or AD.    Limitations Lifting;Standing;Walking;House hold activities    How long can you sit comfortably? unlimited    How long can you stand comfortably? unlimited    How long can you walk comfortably? ~ 1.5 hours    Patient Stated Goals Return to independent walking for 3 miles. Return to to crochet.    Currently in Pain? No/denies    Pain Onset More than a month ago    Pain Onset In the past 7 days  INTERVENTION - gait belt donned and CGA provided unless otherwise specified  Therex:  6MWT:  1177 ft (previously 1178 ft on 08/17/2021)    Seated: LAQ - 2.5# weights donned each LE 2x12-15 reps each LE. Reports intervention is fatiguing.     NMR:  Obstacle course: cones placed to make narrow and wider paths for pt to ambulate through to promote scanning/LE positioning awareness x multiple reps. Pt knocks into cones but improves with practice.   Progressed to PT and/or SPT ambulating near pt to while ambulating through obstacle course to promote scanning and UE positioning awareness x multiple reps.    Ambulation outside weaving around tables, chairs, up/down curbs and over uneven surfaces such as  grass x 10 minutes. VC throughout for UE positioning to avoid sway into tables/chairs. Pt improves with reps.  Performed to promote scanning to decrease sway into objects, and to promote LE mm and cardioresp. endurance.     Pt educated throughout session about proper posture and technique with exercises. Improved exercise technique, movement at target joints, use of target muscles after min to mod verbal, visual, tactile cues.   Rationale for Evaluation and Treatment Rehabilitation Ricard Dillon PT, DPT      PT Short Term Goals - 09/10/21 1114       PT SHORT TERM GOAL #1   Title Pt will be indep with HEP to improve balance, strength, and gait to optimize independence with ADL completion    Baseline 05/24/21: Initiated 4/7: to be advanced; 5/12: to be advanced; 6/19: to be advanced    Time 6    Period Weeks    Status On-going    Target Date 09/14/21               PT Long Term Goals - 09/10/21 1018       PT LONG TERM GOAL #1   Title Pt will improve FOTO to target score to 67 demonstrate clinically significant improvement in functional mobility    Baseline 05/24/21: next session 4/7: 64; 5/12: 70; 5/26: 72%    Time 12    Period Weeks    Status Achieved    Target Date 08/02/21      PT LONG TERM GOAL #2   Title Pt will improve 5xSTS to 12 sec or less to indicate clinically significant improvement in LE strength    Baseline 05/24/21: 17.51 sec 4/7: 14.2 sec hands-free; 5/12: 10.3 sec hands-free    Time 12    Period Weeks    Status Achieved    Target Date 08/16/21      PT LONG TERM GOAL #3   Title Pt will improve 6 MWT by 165' to indicate clinically significant improvement in community ambulation distance for geriatric stroke norms.    Baseline 05/24/21: 765' 4/6: 842 ft with SPC; 5/12: 1060 ft without AD;    Time 12    Period Weeks    Status Achieved    Target Date 08/16/21      PT LONG TERM GOAL #4   Title Pt will improve 10 m gait speed to at least 1.0 m/s to demonstrate  reduced risk of falls with community ambulation tasks.    Baseline 05/24/21: .63 m/s without AD 4/6: 0.83 without an AD; 5/12: 0.93 m/s; 5/26: 1.16 m/s    Time 12    Period Weeks    Status Achieved    Target Date 08/16/21      PT LONG TERM GOAL #5   Title Pt  will improve FGA  by at least 5 point to demonstrate clinically signifcant reduced risk of falls with dynamic balance/ambulation tasks.    Baseline 05/24/21: 17; 4/7: 22    Time 12    Period Weeks    Status Achieved    Target Date 08/16/21      PT LONG TERM GOAL #6   Title Pt will improve 6 MWT by 165' to indicate clinically significant improvement in community ambulation distance for geriatric stroke norms.    Baseline 5/12: 5/12: 1060 ft without AD; 5/26: 1178 ft no AD; 1177 ft no AD    Time 8    Period Weeks    Status Partially Met    Target Date 10/12/21      PT LONG TERM GOAL #7   Title Patient will tolerate 5 seconds of single leg stance bilat without loss of balance to improve ability to get in and out of shower safely.    Baseline 5/12: 3-4 sec bilat; 5/26: L 21 seconds, R 11 seconds    Time 12    Period Weeks    Status Achieved    Target Date 10/26/21      PT LONG TERM GOAL #8   Title Pt will report a 50% improvement in steadiness with turning to walk into and out of rooms in her home in order to reduce fall risk.    Baseline 5/26: pt reports unsteadiness 75% of the time; 6/19: pt feels unsteady 50% of the time    Time 8    Period Weeks    Status On-going    Target Date 10/12/21                   Plan - 09/10/21 1115     Clinical Impression Statement Goals retested for progress note. Pt 6MWT performance similar to previous assessment (difference of 1 ft). Pt still reports fatigue with test as well. Pt does report improvement with steadiness at home ambulating and turning in and out of rooms. Remainder of session largely focused on balance and gait activities that promote scanning and awareness of LE/UE  positioning. Pt improved ability to scan and negotiate obstacles with reps. The pt would benefit from further skilled PT to address these deficits in order to improve strength, balance and mobility and to decrease fall risk.    Personal Factors and Comorbidities Age;Time since onset of injury/illness/exacerbation;Comorbidity 3+;Past/Current Experience    Examination-Activity Limitations Squat;Stairs;Locomotion Level;Stand    Examination-Participation Restrictions Shop;Community Activity;Yard Work    [employer] Evolving/Moderate complexity    Rehab Potential Good    PT Frequency 2x / week    PT Duration 12 weeks    PT Treatment/Interventions ADLs/Self Care Home Management;Canalith Repostioning;Electrical Stimulation;DME Instruction;Gait training;Stair training;Functional mobility training;Therapeutic activities;Patient/family education;Therapeutic exercise;Balance training;Neuromuscular re-education;Energy conservation;Vestibular;Passive range of motion    PT Next Visit Plan Endurance, strengthening, balance, incorproation of dual tasks into exercises, continue with current POC    PT Home Exercise Plan Seated L ankle DF; 3/10 Access Code: [email or web address]; 5/10:  Access Code: [email or web address]; no updates    Consulted and Agree with Plan of Care Patient             Patient will benefit from skilled therapeutic intervention in order to improve the following deficits and impairments:  Abnormal gait, Pain, Decreased mobility, Decreased activity tolerance, Decreased endurance, Decreased strength, Decreased balance, Difficulty walking  Visit Diagnosis: Difficulty in walking, not elsewhere classified  Other lack of coordination  Unsteadiness on feet  Muscle weakness (generalized)     Problem List Patient Active Problem List   Diagnosis Date Noted   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 [place], [place], [address] Phone: [phone]   Fax:  [phone]  Name: Caylea Foronda MRN: [id number] Date of Birth: Aug 31, 1940

## 2021-09-11 NOTE — Therapy (Signed)
[place] MAIN [facility] SERVICES [address] [place], [place], [address] Phone: [phone]   Fax:  [phone]  Occupational Therapy Treatment  Patient Details  Name: Teresa [name] MRN: [id number] Date of Birth: [birth year] No data recorded  Encounter Date: 09/10/2021   OT End of Session - 09/11/21 2253     Visit Number 32    Number of Visits 48    Date for OT Re-Evaluation 11/08/21    OT Start Time 1100    OT Stop Time 1147    OT Time Calculation (min) 47 min    Activity Tolerance Patient tolerated treatment well    Behavior During Therapy [facility] for tasks assessed/performed             Past Medical History:  Diagnosis Date   Cancer ([place])    skin   Hypothyroidism    Raynaud's disease     Past Surgical History:  Procedure Laterality Date   [place]   BACK SURGERY  2010   Cervical fusion C4-5-6-7   CATARACT EXTRACTION, BILATERAL Bilateral 10/2016   CHOLECYSTECTOMY  1995   COLONOSCOPY WITH PROPOFOL N/A 12/08/2018   Procedure: COLONOSCOPY WITH PROPOFOL;  Surgeon: Toledo, Benay Pike, MD;  Location: [facility] ENDOSCOPY;  Service: Gastroenterology;  Laterality: N/A;   EYE SURGERY      There were no vitals filed for this visit.   Subjective Assessment - 09/11/21 2252     Subjective  Pt reports she is doing well, no complaints this date.    Pertinent History February 26, 2021, pt reports she had a CVA, came to [facility] to ER and then was transferred to [facility] in [place] where she was admitted and after acute care she went to inpatient rehab.  Following inpt rehab, pt went home and had home health.    Patient Stated Goals Pt reports her goal is to get back to normal, be as independent as possible.    Currently in Pain? Yes    Pain Score 2     Pain Location Arm    Pain Orientation Left    Pain Descriptors / Indicators Tightness    Pain Type Chronic pain    Pain Onset More than a  month ago    Pain Frequency Intermittent              Therapeutic Exercises:  Pt seen for AAROM to left UE from seated position for shoulder flexion, ABD, ER, elbow flexion/extension, supination/pronation, wrist flex/ext and then PROM followed by AAROM for composite fisting.  Advanced to light Grip with 1st setting 6# , added yellow dycem to handle for secure grip to pick up jumbo pegs and place into container, 18 reps completed Grip with hand gripper only 15 times 2nd setting for increased difficulty but able to perform 4-5 full reps then demonstrated hand fatigue. Resistive Velcro squares to pull off with cues for tip to tip pattern   Neuro: Manipulation of small glass beads, gross grasp with effort, beads with flat side up, increased difficulty and unable if flat side is down.    Response to tx: Pt advancing this date to light resistive gripping with hand gripper on 1st setting, [place], 6#.  Pt able to use gripper to pick up pegs, cues for proper hand grip on gripper, decreased control at times and required occasional assist.  Added dycem to assist with grip.  Continues to work towards gross grasp,  release and coordination tasks with object 1/2 inch in size or larger.  Continue to work towards goals in plan of care to improve left UE ROM, strength and functional use for daily tasks.               OT Education - 09/11/21 2253     Education Details ROM, exercises, tabletop, strengthening    Person(s) Educated Patient    Methods Explanation;Verbal cues    Comprehension Verbalized understanding                 OT Long Term Goals - 09/04/21 1243       OT LONG TERM GOAL #1   Title Pt will be independent with home exercise program.    Baseline Eval: no current program, 10th visit:  continue to add new exercises as pt progresses, 20th:  continue to update HEP; 08/17/21: continue to progress HEP when indicated    Time 12    Period Weeks    Status On-going     Target Date 11/08/21      OT LONG TERM GOAL #2   Title Pt will complete UB and LB dressing with modified independence including buttons, snaps and zippers.    Baseline requires min assist at eval, 10th visit: occasional assist with buttons, 20th:  able to perform one handed, but difficulty with bilateral UE; 08/17/21: pt reports inconsistent with 1 hand, reviewed techniques this visit    Time 12    Period Weeks    Status On-going    Target Date 11/08/21      OT LONG TERM GOAL #3   Title Pt will perform shower transfer with modified independence.    Baseline Pt requires supervision to min assist for shower transfer at home. 10th visit: supervision; 08/17/21: supv    Time 6    Period Weeks    Status Partially Met    Target Date 09/27/21      OT LONG TERM GOAL #4   Title Pt will improve L hand grip by 10# to assist with holding items in left hand securely.    Baseline no grip in left hand at eval, 10th visit:  improved flexion but still working towards composite fisting and grip. 20th:  continues to demo decreased grip; 08/17/21: active digit flexion improving, but not yet able to register grip on dynamometer; 09/04/21: L grip 1#    Time 12    Period Weeks    Status On-going    Target Date 11/08/21      OT LONG TERM GOAL #5   Title Pt will improve left shoulder flexion to 100 degrees or better to improve reaching to obtain self care items from shelf/shoulder height.    Baseline difficulty with reach, shoulder flexion to 47 degrees; 08/17/21: L shoulder flexion 85, but not yet able to consistently hold ADL supplies in L hand when reaching; 09/04/21: flexion 85    Time 12    Period Weeks    Status On-going    Target Date 11/08/21      OT LONG TERM GOAL #6   Title Pt will improve FOTO score to 47 or above to demonstrate a clinically relevant change in function to impact ADL tasks.    Baseline score of 30 at eval; 08/17/21: FOTO: 42; 09/04/21: FOTO : 54    Time 12    Period Weeks    Status  On-going    Target Date 11/08/21      OT LONG TERM  GOAL #7   Title Pt will demonstrate ability to pick up small objects and complete 9 hole peg test in less than 2 mins.    Baseline unable to perform at eval, 10th visit: still unable to pick up small pegs, 20th: unable to pick up pegs but improving; 08/17/21: not attempted d/t time constraints, will assess next visit; 09/04/21: 9 hole peg test in 5 min 53 sec    Time 12    Period Weeks    Status On-going    Target Date 11/08/21                   Plan - 09/11/21 2254     Clinical Impression Statement Pt advancing this date to light resistive gripping with hand gripper on 1st setting, [place], 6#.  Pt able to use gripper to pick up pegs, cues for proper hand grip on gripper, decreased control at times and required occasional assist.  Added dycem to assist with grip.  Continues to work towards gross grasp, release and coordination tasks with object 1/2 inch in size or larger.  Continue to work towards goals in plan of care to improve left UE ROM, strength and functional use for daily tasks.    OT Occupational Profile and History Detailed Assessment- Review of Records and additional review of physical, cognitive, psychosocial history related to current functional performance    Occupational performance deficits (Please refer to evaluation for details): ADL's;IADL's;Leisure;Rest and Sleep    Body Structure / Function / Physical Skills ADL;Coordination;Endurance;GMC;UE functional use;Balance;IADL;Pain;Dexterity;FMC;Strength;Edema;Mobility;ROM    Psychosocial Skills Environmental  Adaptations;Habits;Routines and Behaviors    Rehab Potential Good    Clinical Decision Making Several treatment options, min-mod task modification necessary    Comorbidities Affecting Occupational Performance: May have comorbidities impacting occupational performance    Modification or Assistance to Complete Evaluation  Min-Moderate modification of tasks or  assist with assess necessary to complete eval    OT Frequency 2x / week    OT Duration 12 weeks    OT Treatment/Interventions Self-care/ADL training;Cryotherapy;Paraffin;Therapeutic exercise;DME and/or AE instruction;Functional Mobility Training;Balance training;Electrical Stimulation;Ultrasound;Neuromuscular education;Manual Therapy;Splinting;Moist Heat;Contrast Bath;Passive range of motion;Therapeutic activities;Patient/family education;Coping strategies training    Plan OT recert    Consulted and Agree with Plan of Care Patient             Patient will benefit from skilled therapeutic intervention in order to improve the following deficits and impairments:   Body Structure / Function / Physical Skills: ADL, Coordination, Endurance, GMC, UE functional use, Balance, IADL, Pain, Dexterity, FMC, Strength, Edema, Mobility, ROM   Psychosocial Skills: Environmental  Adaptations, Habits, Routines and Behaviors   Visit Diagnosis: Muscle weakness (generalized)  Other lack of coordination  Right middle cerebral artery stroke [facility])    Problem List Patient Active Problem List   Diagnosis Date Noted   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

## 2021-09-12 ENCOUNTER — Ambulatory Visit: Payer: Medicare PPO

## 2021-09-12 DIAGNOSIS — R262 Difficulty in walking, not elsewhere classified: Secondary | ICD-10-CM

## 2021-09-12 DIAGNOSIS — M6281 Muscle weakness (generalized): Secondary | ICD-10-CM

## 2021-09-12 DIAGNOSIS — R278 Other lack of coordination: Secondary | ICD-10-CM

## 2021-09-12 DIAGNOSIS — I63511 Cerebral infarction due to unspecified occlusion or stenosis of right middle cerebral artery: Secondary | ICD-10-CM

## 2021-09-12 DIAGNOSIS — R2681 Unsteadiness on feet: Secondary | ICD-10-CM

## 2021-09-12 NOTE — Therapy (Signed)
[place] MAIN [facility] SERVICES [address] [place], [place], [address] Phone: [phone]   Fax:  [phone]  Patient Details  Name: Teresa [name] MRN: [id number] Date of Birth: December 02, 1940 Referring Provider:  Idelle Crouch, MD  Encounter Date: 09/12/2021   OUTPATIENT PHYSICAL THERAPY TREATMENT NOTE   Patient Name: Teresa [name] MRN: [id number] DOB:10-30-1940, 81 y.o., female Today's Date: 09/12/2021  PCP: Idelle Crouch, MD REFERRING PROVIDER: Idelle Crouch, MD   PT End of Session - 09/12/21 1316     Visit Number 31    Number of Visits 40    Date for PT Re-Evaluation 10/12/21    Authorization Type [employer] PPO-    Authorization Time Period cert 0/92/3300-7/62/2633    PT Start Time 1101    PT Stop Time 1144    PT Time Calculation (min) 43 min    Equipment Utilized During Treatment Gait belt    Activity Tolerance Patient tolerated treatment well    Behavior During Therapy WFL for tasks assessed/performed             Past Medical History:  Diagnosis Date   Cancer ([place])    skin   Hypothyroidism    Raynaud's disease    Past Surgical History:  Procedure Laterality Date   [place]   BACK SURGERY  2010   Cervical fusion C4-5-6-7   CATARACT EXTRACTION, BILATERAL Bilateral 10/2016   CHOLECYSTECTOMY  1995   COLONOSCOPY WITH PROPOFOL N/A 12/08/2018   Procedure: COLONOSCOPY WITH PROPOFOL;  Surgeon: Toledo, Benay Pike, MD;  Location: [facility] ENDOSCOPY;  Service: Gastroenterology;  Laterality: N/A;   EYE SURGERY     Patient Active Problem List   Diagnosis Date Noted   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 Pt completed CIR from 03/02/21-03/22/21. PMH includes: skin cancer, hypothyroidism, and Raynaud's disease, history of cervical fusion in 2010 C4-C7. Pt reports HH PT/OT/SLP was performed from January 2nd to February 28th with focus on ADL completion, ambulation. Pt's big goal tranisitioning to ambulating up to 3 miles a day which pt was doing prior to stroke. Pt has been ambulating in [employer] with husband but last day or two pt has noticed increased L foot drag but states it is better today. Pt denies any falls at home or community. Pt reports balance deficits with turns primarily. States her balance is good with her quad cane. Wishing to return to full independence in ambulation with household and community ambulation. Wishing to be able to asc/desc full flight of stairs with ability to carry decorative items and other household items without need for handrail and/or AD.  PRECAUTIONS: fall  SUBJECTIVE: Pt reports no changes since last session. Pt reports arms are "finally feeling better".  PAIN:  Are you having pain? No     TODAY'S TREATMENT:  09/12/21 - gait belt donned and CGA provided unless otherwise specified    On airex pad: - NBOS  EO: 30 sec - NBOS EC: 2 x 30 sec - Slow march: multiple reps for 60 sec - Tandem stance: 2 x 30 sec each way  LAQ: 3# weights donned each LE 2 x 12 reps each LE. Reports as fatiguing.   Ambulating in hallway without AD: - reading sticky notes to incorporate horizontal head turns: 2x, VC to utilize cervical rotation.  - vertical head turns to promote dynamic balance: 2x  - passing ball side to side with PT/SPT to promote dynamic balance with turning: 4x, min  LOB  Obstacle course: cones placed to make narrow and wider gates for pt to ambulate through to promote scanning/LE positioning awareness x multiple reps. Pt knocks into cones but improves with practice. Cones place to encourage turning in narrow spaces.   On static surface: - Balloon toss, NBOS, 90 sec    PATIENT EDUCATION: Education details: Pt educated throughout session about proper posture and technique with exercises. Improved exercise technique, movement at target joints, use of target muscles after min to mod verbal, visual, tactile cues.  Person educated: Patient Education method: Explanation, Demonstration, and Verbal cues Education comprehension: verbalized understanding, returned demonstration, verbal cues required, and needs further education   HOME EXERCISE PROGRAM: Seated L ankle DF; 3/10 Access Code: [email or web address]; 5/10: Access Code: [email or web address]; no updates as of 09/12/21   Izola Price, SPT  This entire session was performed under direct supervision and direction of a licensed therapist/therapist assistant . I have personally read, edited and approve of the note as written. Ricard Dillon PT, DPT     PT Short Term Goals -       PT SHORT TERM GOAL #1   Title Pt will be indep with HEP to improve balance, strength, and gait to optimize independence with ADL completion    Baseline 05/24/21: Initiated 4/7: to be advanced; 5/12: to be advanced; 6/19: to be advanced    Time 6    Period Weeks    Status On-going    Target Date 09/14/21              PT Long Term Goals -       PT LONG TERM GOAL #1   Title Pt will improve FOTO to target score to 67 demonstrate clinically significant improvement in functional mobility    Baseline 05/24/21: next session 4/7: 64; 5/12: 70; 5/26: 72%    Time 12    Period Weeks    Status Achieved    Target Date 08/02/21      PT LONG TERM GOAL #2   Title Pt will improve 5xSTS to 12 sec or less to indicate clinically significant improvement  in LE strength    Baseline 05/24/21: 17.51 sec 4/7: 14.2 sec hands-free; 5/12: 10.3 sec hands-free    Time 12    Period Weeks    Status Achieved    Target Date 08/16/21      PT LONG TERM GOAL #3   Title Pt will improve 6 MWT by 165' to indicate clinically significant improvement in community ambulation distance for geriatric stroke norms.    Baseline 05/24/21: 765' 4/6: 842 ft with SPC; 5/12: 1060 ft without AD;    Time 12    Period Weeks    Status Achieved    Target Date 08/16/21      PT LONG TERM GOAL #4   Title Pt will improve 10 m gait speed to at least 1.0 m/s to demonstrate reduced risk of falls with community ambulation tasks.  Baseline 05/24/21: .63 m/s without AD 4/6: 0.83 without an AD; 5/12: 0.93 m/s; 5/26: 1.16 m/s    Time 12    Period Weeks    Status Achieved    Target Date 08/16/21      PT LONG TERM GOAL #5   Title Pt will improve FGA  by at least 5 point to demonstrate clinically signifcant reduced risk of falls with dynamic balance/ambulation tasks.    Baseline 05/24/21: 17; 4/7: 22    Time 12    Period Weeks    Status Achieved    Target Date 08/16/21      PT LONG TERM GOAL #6   Title Pt will improve 6 MWT by 165' to indicate clinically significant improvement in community ambulation distance for geriatric stroke norms.    Baseline 5/12: 5/12: 1060 ft without AD; 5/26: 1178 ft no AD; 1177 ft no AD    Time 8    Period Weeks    Status Partially Met    Target Date 10/12/21      PT LONG TERM GOAL #7   Title Patient will tolerate 5 seconds of single leg stance bilat without loss of balance to improve ability to get in and out of shower safely.    Baseline 5/12: 3-4 sec bilat; 5/26: L 21 seconds, R 11 seconds    Time 12    Period Weeks    Status Achieved    Target Date 10/26/21      PT LONG TERM GOAL #8   Title Pt will report a 50% improvement in steadiness with turning to walk into and out of rooms in her home in order to reduce fall risk.    Baseline 5/26: pt  reports unsteadiness 75% of the time; 6/19: pt feels unsteady 50% of the time    Time 8    Period Weeks    Status On-going    Target Date 10/12/21              Plan -     Clinical Impression Statement Session's main focus was on balance and gait activities to promote navigating obstacles and LE positioning awareness. Pt continues to demonstrate improvement with obstacle negotiation with repitition. Noted min LOB throughout session. Pt would benefit from further skilled PT to improve strength, balance and mobility.    Personal Factors and Comorbidities Age;Time since onset of injury/illness/exacerbation;Comorbidity 3+;Past/Current Experience    Examination-Activity Limitations Squat;Stairs;Locomotion Level;Stand    Examination-Participation Restrictions Shop;Community Activity;Yard Work    [employer] Evolving/Moderate complexity    Rehab Potential Good    PT Frequency 2x / week    PT Duration 12 weeks    PT Treatment/Interventions ADLs/Self Care Home Management;Canalith Repostioning;Electrical Stimulation;DME Instruction;Gait training;Stair training;Functional mobility training;Therapeutic activities;Patient/family education;Therapeutic exercise;Balance training;Neuromuscular re-education;Energy conservation;Vestibular;Passive range of motion    PT Next Visit Plan Endurance, strengthening, balance, incorproation of dual tasks into exercises, continue with POC    PT Home Exercise Plan Seated L ankle DF; 3/10 Access Code: [email or web address]; 5/10:  Access Code: [email or web address]; no updates    Consulted and Agree with Plan of Care Patient               Zollie Pee, PT 09/12/2021, 3:52 PM  [facility] [address] [place], [place], [address] Phone: [phone]   Fax:  [phone]

## 2021-09-13 NOTE — Therapy (Signed)
[place] MAIN [facility] SERVICES [address] [place], [place], [address] Phone: [phone]   Fax:  [phone]  Occupational Therapy Treatment  Patient Details  Name: Teresa [name] MRN: [id number] Date of Birth: 01-Aug-1940 No data recorded  Encounter Date: 09/12/2021   OT End of Session - 09/13/21 0853     Visit Number 33    Number of Visits 48    Date for OT Re-Evaluation 11/08/21    OT Start Time 3818    OT Stop Time 1230    OT Time Calculation (min) 45 min    Activity Tolerance Patient tolerated treatment well    Behavior During Therapy [facility] for tasks assessed/performed             Past Medical History:  Diagnosis Date   Cancer ([place])    skin   Hypothyroidism    Raynaud's disease     Past Surgical History:  Procedure Laterality Date   [place]   BACK SURGERY  2010   Cervical fusion C4-5-6-7   CATARACT EXTRACTION, BILATERAL Bilateral 10/2016   CHOLECYSTECTOMY  1995   COLONOSCOPY WITH PROPOFOL N/A 12/08/2018   Procedure: COLONOSCOPY WITH PROPOFOL;  Surgeon: Toledo, Benay Pike, MD;  Location: [facility] ENDOSCOPY;  Service: Gastroenterology;  Laterality: N/A;   EYE SURGERY      There were no vitals filed for this visit.  Occupational Therapy Treatment: Therapeutic Exercise: Performed passive L wrist flex/ext, digit flex/ext all digits, and PROM/AAROM for L shoulder flex/abd/horiz abd/add, ER x2 sets 10 reps each. Performed AROM for same x10 reps each.  Performed prolonged passive stretch for ER, working to increase joint flexibility for increased functional use of LUE.  Completed L grip strengthening with hand gripper (1 red band for light resistance) for 3 sets 10 reps each.    Self Care: Issued built up foam handle for fork and comb for home.  Practiced cutting skills with fork in L hand and knife in the R, with pt able to cut small pieces of soft putty with built up handles.  Pt  states she's been trying to hold the knife and saw with the L hand at home, but was unable, so was pleased to be able to switch hands and perform successfully.  Pt practiced gripping ball and gripping foam piece during L shoulder ROM to simulate maintain hold of grooming tools (dryer, comb, curling iron) while reaching to head.  Able to manage without releasing items in hand.  Response to Treatment: Pt reports she was able to hold her blow dryer and dry her hair the other day using her L hand.  Pt states she also started to do a little curling with her curling iron, but spouse helped with the top and back of head for curling.  Issued built up foam handle for fork and comb for home.  Practiced cutting skills with fork in L hand and knife in the R, with pt able to cut small pieces of soft putty with built up handles.  Pt states she's been trying to hold the knife and saw with the L hand at home, but was unable, so was pleased to be able to switch hands and perform successfully.  Pt states she still can't get her rings on her L hand, but edema is improving and encouraged pt that with continued use of her L hand, edema should continue to improve.  Pt will continue to benefit from skilled  OT to work towards goals in plan of care to improve LUE functional use for ADL and IADL tasks.    OT Long Term Goals - 09/04/21 1243       OT LONG TERM GOAL #1   Title Pt will be independent with home exercise program.    Baseline Eval: no current program, 10th visit:  continue to add new exercises as pt progresses, 20th:  continue to update HEP; 08/17/21: continue to progress HEP when indicated    Time 12    Period Weeks    Status On-going    Target Date 11/08/21      OT LONG TERM GOAL #2   Title Pt will complete UB and LB dressing with modified independence including buttons, snaps and zippers.    Baseline requires min assist at eval, 10th visit: occasional assist with buttons, 20th:  able to perform one handed, but  difficulty with bilateral UE; 08/17/21: pt reports inconsistent with 1 hand, reviewed techniques this visit    Time 12    Period Weeks    Status On-going    Target Date 11/08/21      OT LONG TERM GOAL #3   Title Pt will perform shower transfer with modified independence.    Baseline Pt requires supervision to min assist for shower transfer at home. 10th visit: supervision; 08/17/21: supv    Time 6    Period Weeks    Status Partially Met    Target Date 09/27/21      OT LONG TERM GOAL #4   Title Pt will improve L hand grip by 10# to assist with holding items in left hand securely.    Baseline no grip in left hand at eval, 10th visit:  improved flexion but still working towards composite fisting and grip. 20th:  continues to demo decreased grip; 08/17/21: active digit flexion improving, but not yet able to register grip on dynamometer; 09/04/21: L grip 1#    Time 12    Period Weeks    Status On-going    Target Date 11/08/21      OT LONG TERM GOAL #5   Title Pt will improve left shoulder flexion to 100 degrees or better to improve reaching to obtain self care items from shelf/shoulder height.    Baseline difficulty with reach, shoulder flexion to 47 degrees; 08/17/21: L shoulder flexion 85, but not yet able to consistently hold ADL supplies in L hand when reaching; 09/04/21: flexion 85    Time 12    Period Weeks    Status On-going    Target Date 11/08/21      OT LONG TERM GOAL #6   Title Pt will improve FOTO score to 47 or above to demonstrate a clinically relevant change in function to impact ADL tasks.    Baseline score of 30 at eval; 08/17/21: FOTO: 42; 09/04/21: FOTO : 54    Time 12    Period Weeks    Status On-going    Target Date 11/08/21      OT LONG TERM GOAL #7   Title Pt will demonstrate ability to pick up small objects and complete 9 hole peg test in less than 2 mins.    Baseline unable to perform at eval, 10th visit: still unable to pick up small pegs, 20th: unable to pick up  pegs but improving; 08/17/21: not attempted d/t time constraints, will assess next visit; 09/04/21: 9 hole peg test in 5 min 53 sec    Time  12    Period Weeks    Status On-going    Target Date 11/08/21              Plan - 09/12/21 0916     Clinical Impression Statement Pt reports she was able to hold her blow dryer and dry her hair the other day using her L hand.  Pt states she also started to do a little curling with her curling iron, but spouse helped with the top and back of head for curling.  Issued built up foam handle for fork and comb for home.  Practiced cutting skills with fork in L hand and knife in the R, with pt able to cut small pieces of soft putty with built up handles.  Pt states she's been trying to hold the knife and saw with the L hand at home, but was unable, so was pleased to be able to switch hands and perform successfully.  Pt states she still can't get her rings on her L hand, but edema is improving and encouraged pt that with continued use of her L hand, edema should continue to improve.  Pt will continue to benefit from skilled OT to work towards goals in plan of care to improve LUE functional use for ADL and IADL tasks.    OT Occupational Profile and History Detailed Assessment- Review of Records and additional review of physical, cognitive, psychosocial history related to current functional performance    Occupational performance deficits (Please refer to evaluation for details): ADL's;IADL's;Leisure;Rest and Sleep    Body Structure / Function / Physical Skills ADL;Coordination;Endurance;GMC;UE functional use;Balance;IADL;Pain;Dexterity;FMC;Strength;Edema;Mobility;ROM    Psychosocial Skills Environmental  Adaptations;Habits;Routines and Behaviors    Rehab Potential Good    Clinical Decision Making Several treatment options, min-mod task modification necessary    Comorbidities Affecting Occupational Performance: May have comorbidities impacting occupational performance     Modification or Assistance to Complete Evaluation  Min-Moderate modification of tasks or assist with assess necessary to complete eval    OT Frequency 2x / week    OT Duration 12 weeks    OT Treatment/Interventions Self-care/ADL training;Cryotherapy;Paraffin;Therapeutic exercise;DME and/or AE instruction;Functional Mobility Training;Balance training;Electrical Stimulation;Ultrasound;Neuromuscular education;Manual Therapy;Splinting;Moist Heat;Contrast Bath;Passive range of motion;Therapeutic activities;Patient/family education;Coping strategies training    Plan OT recert    Consulted and Agree with Plan of Care Patient             Patient will benefit from skilled therapeutic intervention in order to improve the following deficits and impairments:   Body Structure / Function / Physical Skills: ADL, Coordination, Endurance, GMC, UE functional use, Balance, IADL, Pain, Dexterity, FMC, Strength, Edema, Mobility, ROM   Psychosocial Skills: Environmental  Adaptations, Habits, Routines and Behaviors   Visit Diagnosis: Muscle weakness (generalized)  Other lack of coordination  Right middle cerebral artery stroke [facility])    Problem List Patient Active Problem List   Diagnosis Date Noted   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 [place], [place], [address] Phone: [phone]   Fax:  [phone]  Name: Teresa [name] MRN: [id number] Date of Birth: 1940-11-09

## 2021-09-15 ENCOUNTER — Emergency Department: Payer: Medicare PPO

## 2021-09-15 ENCOUNTER — Other Ambulatory Visit: Payer: Self-pay

## 2021-09-15 ENCOUNTER — Emergency Department
Admission: EM | Admit: 2021-09-15 | Discharge: 2021-09-15 | Disposition: A | Discharge status: Home / Self Care | Payer: Medicare PPO | Attending: Emergency Medicine | Admitting: Emergency Medicine

## 2021-09-15 ENCOUNTER — Encounter: Payer: Self-pay | Admitting: Emergency Medicine

## 2021-09-15 DIAGNOSIS — S8254XA Nondisplaced fracture of medial malleolus of right tibia, initial encounter for closed fracture: Secondary | ICD-10-CM | POA: Insufficient documentation

## 2021-09-15 DIAGNOSIS — E039 Hypothyroidism, unspecified: Secondary | ICD-10-CM | POA: Diagnosis not present

## 2021-09-15 DIAGNOSIS — S79911A Unspecified injury of right hip, initial encounter: Secondary | ICD-10-CM | POA: Diagnosis present

## 2021-09-15 DIAGNOSIS — Z859 Personal history of malignant neoplasm, unspecified: Secondary | ICD-10-CM | POA: Insufficient documentation

## 2021-09-15 DIAGNOSIS — W010XXA Fall on same level from slipping, tripping and stumbling without subsequent striking against object, initial encounter: Secondary | ICD-10-CM | POA: Diagnosis not present

## 2021-09-15 DIAGNOSIS — Y92009 Unspecified place in unspecified non-institutional (private) residence as the place of occurrence of the external cause: Secondary | ICD-10-CM | POA: Insufficient documentation

## 2021-09-15 DIAGNOSIS — S82391A Other fracture of lower end of right tibia, initial encounter for closed fracture: Secondary | ICD-10-CM

## 2021-09-15 MED ORDER — OXYCODONE HCL 5 MG PO TABS
5.0000 mg | ORAL_TABLET | Freq: Once | ORAL | Status: AC
  Administered 2021-09-15: 5 mg via ORAL
  Filled 2021-09-15: qty 1

## 2021-09-15 MED ORDER — OXYCODONE HCL 5 MG PO TABS: ORAL_TABLET | ORAL | 0 refills | Status: DC

## 2021-09-15 NOTE — ED Provider Notes (Signed)
[facility] Provider Note    Event Date/Time   First MD Initiated Contact with Patient 09/15/21 1030     (approximate)   History   Fall and Ankle Pain   HPI  Teresa [name] is a 81 y.o. female   presents to the ED by family members with a mechanical fall that occurred this morning at home.  Patient states that she slipped on her pajama pants causing her to fall forward.  She reports that she briefly landed on her right hip but that her right knee and right ankle have continued to hurt.  She denies any head injury or loss of consciousness and family states that she is at her midline.  Patient denies any previous ankle or knee problems.  Patient has a history of cancer, hypothyroidism, Raynaud's disease, CVA.  Patient is currently working with physical therapy 2 days a week at [facility].      Physical Exam   Triage Vital Signs: ED Triage Vitals  Enc Vitals Group     BP 09/15/21 1021 (!) 141/92     Pulse Rate 09/15/21 1021 84     Resp 09/15/21 1021 18     Temp 09/15/21 1021 98.7 F (37.1 C)     Temp Source 09/15/21 1021 Oral     SpO2 09/15/21 1021 97 %     Weight 09/15/21 1019 140 lb (63.5 kg)     Height 09/15/21 1019 5\' 2"  (1.575 m)     Head Circumference --      Peak Flow --      Pain Score 09/15/21 1018 8     Pain Loc --      Pain Edu? --      Excl. in GC? --     Most recent vital signs: Vitals:   09/15/21 1021  BP: (!) 141/92  Pulse: 84  Resp: 18  Temp: 98.7 F (37.1 C)  SpO2: 97%     General: Awake, no distress.  CV:  Good peripheral perfusion.  Resp:  Normal effort.  Abd:  No distention.  Other:  There is tenderness on palpation of the right hip laterally.  No point tenderness or effusion is noted on palpation of the right knee.  No abrasions or discoloration noted.  Patient is able to bend her right knee without any difficulty and denies any radiation into her hip.  There is marked tenderness on palpation of the right  ankle bilaterally.  No gross deformity present.  No soft tissue edema, abrasion or discoloration is present.  Pulses are even bilaterally.  Motor or sensory function intact.     ED Results / Procedures / Treatments   Labs (all labs ordered are listed, but only abnormal results are displayed) Labs Reviewed - No data to display   RADIOLOGY  Right hip with pelvis views were reviewed and interpreted by myself independent of the radiologist and was negative for fracture or dislocation.  Right ankle x-ray was reviewed and interpreted as negative however radiologist mentioned a area of lucency at the posterior distal tibia which is questionable for a nondisplaced fracture.  This area was seen on the lateral view only.  It was suggested that we obtain a CT scan was symptomatic in this area which raises suspicions for a fracture.  CT scan right ankle per radiologist is positive for a nondisplaced oblique coursing intra-articular fracture through the posterior malleolar region of the tibia.  No fracture of the medial malleolus or  distal fibula.  Ankle mortise is normal.   PROCEDURES:  Critical Care performed:   Procedures   MEDICATIONS ORDERED IN ED: Medications  oxyCODONE (Oxy IR/ROXICODONE) immediate release tablet 5 mg (5 mg Oral Given 09/15/21 1254)     IMPRESSION / MDM / ASSESSMENT AND PLAN / ED COURSE  I reviewed the triage vital signs and the nursing notes.   Differential diagnosis includes, but is not limited to, sprain right ankle, sprain right foot, contusion right hip, fracture right hip, fractured ankle, dislocation.  81 year old female presents to the ED with complaint right lower extremity pain after a mechanical fall this morning in which she slipped on her but pants this morning.  Patient and family deny any head injury or loss of consciousness.  X-rays of the right hip were negative for fracture and reassuring.  Right ankle with soft tissue edema was suspicious for fracture  and on plain films there was a suggestion of a nondisplaced fracture versus a nutrient at the posterior tibial view.  CT scan confirms that this is a nondisplaced fracture and patient and family were made aware.  I consulted Dr. Joice Lofts who is on-call for orthopedics who states that she will be followed up in the office either by the orthopedic office or the podiatry office.  Patient was placed in a posterior OCL and a OCL stirrup splint.  Patient is aware that she cannot bear weight with this and already has a walker that she can use at home.  Patient was given oxycodone IR 5 mg while in the emergency department without any development of nausea.  Patient was made aware that this medication would be sent to the pharmacy and to be careful as this could cause drowsiness and increase her risk for falling again.  She will leave the splint on and elevate her leg to reduce swelling.  Family was present while these instructions were being given.     Patient's presentation is most consistent with acute complicated illness / injury requiring diagnostic workup.  FINAL CLINICAL IMPRESSION(S) / ED DIAGNOSES   Final diagnoses:  Closed traumatic nondisplaced fracture of posterior malleolus of right tibia, initial encounter     Rx / DC Orders   ED Discharge Orders          Ordered    oxyCODONE (OXY IR/ROXICODONE) 5 MG immediate release tablet        09/15/21 1309             Note:  This document was prepared using Dragon voice recognition software and may include unintentional dictation errors.   Tommi Rumps, PA-C 09/15/21 1456    Georga Hacking, MD 09/15/21 [phone]

## 2021-09-17 ENCOUNTER — Ambulatory Visit: Payer: Medicare PPO

## 2021-09-17 DIAGNOSIS — M6281 Muscle weakness (generalized): Secondary | ICD-10-CM

## 2021-09-17 DIAGNOSIS — R278 Other lack of coordination: Secondary | ICD-10-CM

## 2021-09-17 DIAGNOSIS — I63511 Cerebral infarction due to unspecified occlusion or stenosis of right middle cerebral artery: Secondary | ICD-10-CM

## 2021-09-17 DIAGNOSIS — R2681 Unsteadiness on feet: Secondary | ICD-10-CM

## 2021-09-17 DIAGNOSIS — M79604 Pain in right leg: Secondary | ICD-10-CM

## 2021-09-17 DIAGNOSIS — R262 Difficulty in walking, not elsewhere classified: Secondary | ICD-10-CM

## 2021-09-19 ENCOUNTER — Ambulatory Visit: Payer: Medicare PPO

## 2021-09-19 DIAGNOSIS — M6281 Muscle weakness (generalized): Secondary | ICD-10-CM

## 2021-09-19 DIAGNOSIS — I63511 Cerebral infarction due to unspecified occlusion or stenosis of right middle cerebral artery: Secondary | ICD-10-CM

## 2021-09-19 DIAGNOSIS — R278 Other lack of coordination: Secondary | ICD-10-CM | POA: Diagnosis not present

## 2021-09-19 NOTE — Therapy (Signed)
OUTPATIENT OCCUPATIONAL THERAPY TREATMENT NOTE   Patient Name: Teresa [name] MRN: [id number] DOB:[birth year], 81 y.o., female Today's Date: 09/19/2021  PCP: Dr. [name] REFERRING PROVIDER: Dr. [name]   OT End of Session - 09/19/21 1117     Visit Number 35    Number of Visits 53    Date for OT Re-Evaluation 11/08/21    OT Start Time 0830    OT Stop Time 0915    OT Time Calculation (min) 45 min    Activity Tolerance Patient tolerated treatment well    Behavior During Therapy [facility] for tasks assessed/performed             Past Medical History:  Diagnosis Date   Cancer ([place])    skin   Hypothyroidism    Raynaud's disease    Past Surgical History:  Procedure Laterality Date   [place]   BACK SURGERY  2010   Cervical fusion C4-5-6-7   CATARACT EXTRACTION, BILATERAL Bilateral 10/2016   CHOLECYSTECTOMY  1995   COLONOSCOPY WITH PROPOFOL N/A 12/08/2018   Procedure: COLONOSCOPY WITH PROPOFOL;  Surgeon: Toledo, Benay Pike, MD;  Location: [facility] ENDOSCOPY;  Service: Gastroenterology;  Laterality: N/A;   EYE SURGERY     Patient Active Problem List   Diagnosis Date Noted   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  Following inpt rehab, pt went home and had home health.   PRECAUTIONS: fall  SUBJECTIVE: "I see Dr. Roland Rack next Thurs to see if I can get in a walking boot vs a cast."  PAIN:  Are  you having pain? Yes: NPRS scale: 8/10 Pain location: R shoulder Pain description: sore Aggravating factors: raising R arm (sore from spouse lifting pt out of a chair since ankle injury)  Relieving factors: rest, heat, otc pain meds     OBJECTIVE:   TODAY'S TREATMENT:  Therapeutic Exercise: Performed passive L wrist flex/ext, digit flex/ext all digits, and PROM/AAROM for L shoulder flex/abd/horiz abd/add, ER x2 sets 10 reps each. Performed prolonged passive stretch for ER, working to increase joint flexibility for increased functional use of LUE.  Completed L grip strengthening with hand gripper (1 red band for light resistance) for 3 sets 10 reps each, followed by passive wrist and digit extension stretch after each set.  Facilitated pinch strengthening for lateral and 3 point pinch using yellow and red clothespins and clipping them onto a vertical dowel.  Pt was able to pinch the green pins with effort, but not enough to clip onto the dowel.  Facilitated reaching with LUE moving clips from table top into container placed at shoulder height after removing pins from dowel.    PATIENT EDUCATION: Education details: advised spouse on proper pt handling; encouraged use of gait belt or waist of pants and avoid lifting pt from under arms. Person educated: Spouse Education method: Explanation and Verbal cues Education comprehension: verbalized understanding   HOME EXERCISE PROGRAM Continue  to engage LUE into ADLs; continue gripping and pinching exercises for LUE, and L shoulder AROM/AAROM      OT Long Term Goals -       OT LONG TERM GOAL #1   Title Pt will be independent with home exercise program.    Baseline Eval: no current program, 10th visit:  continue to add new exercises as pt progresses, 20th:  continue to update HEP; 08/17/21: continue to progress HEP when indicated    Time 12    Period Weeks    Status On-going    Target Date 11/08/21      OT LONG TERM GOAL #2   Title Pt will  complete UB and LB dressing with modified independence including buttons, snaps and zippers.    Baseline requires min assist at eval, 10th visit: occasional assist with buttons, 20th:  able to perform one handed, but difficulty with bilateral UE; 08/17/21: pt reports inconsistent with 1 hand, reviewed techniques this visit    Time 12    Period Weeks    Status On-going    Target Date 11/08/21      OT LONG TERM GOAL #3   Title Pt will perform shower transfer with modified independence.    Baseline Pt requires supervision to min assist for shower transfer at home. 10th visit: supervision; 08/17/21: supv    Time 6    Period Weeks    Status Partially Met    Target Date 09/27/21      OT LONG TERM GOAL #4   Title Pt will improve L hand grip by 10# to assist with holding items in left hand securely.    Baseline no grip in left hand at eval, 10th visit:  improved flexion but still working towards composite fisting and grip. 20th:  continues to demo decreased grip; 08/17/21: active digit flexion improving, but not yet able to register grip on dynamometer; 09/04/21: L grip 1#    Time 12    Period Weeks    Status On-going    Target Date 11/08/21      OT LONG TERM GOAL #5   Title Pt will improve left shoulder flexion to 100 degrees or better to improve reaching to obtain self care items from shelf/shoulder height.    Baseline difficulty with reach, shoulder flexion to 47 degrees; 08/17/21: L shoulder flexion 85, but not yet able to consistently hold ADL supplies in L hand when reaching; 09/04/21: flexion 85    Time 12    Period Weeks    Status On-going    Target Date 11/08/21      OT LONG TERM GOAL #6   Title Pt will improve FOTO score to 47 or above to demonstrate a clinically relevant change in function to impact ADL tasks.    Baseline score of 30 at eval; 08/17/21: FOTO: 42; 09/04/21: FOTO : 54    Time 12    Period Weeks    Status On-going    Target Date 11/08/21      OT LONG TERM GOAL #7    Title Pt will demonstrate ability to pick up small objects and complete 9 hole peg test in less than 2 mins.    Baseline unable to perform at eval, 10th visit: still unable to pick up small pegs, 20th: unable to pick up pegs but improving; 08/17/21: not attempted d/t time constraints, will assess next visit; 09/04/21: 9 hole peg test in 5 min 53 sec    Time 12  Period Weeks    Status On-going    Target Date 11/08/21              Plan -     Clinical Impression Statement Pt reports she's been using her L hand well to transfer and walk with the walker since she sustained her R ankle hairline fx over the weekend.   Pt is now TTWB to the RLE until she sees Dr. Roland Rack next [place].  R shoulder is sore from spouse helping to lift pt out of a chair since this ankle injury.  OT provided education on proper pt handling technique and need to use gait belt or waist of pants; spouse verbalized understanding and both agreed they would start using the gait belt.  Pt continues to make steady progress with engaging LUE into ADLs.  Pt will continue to benefit from skilled OT to work towards goals in plan of care to improve LUE functional use for ADL and IADL tasks.   OT Occupational Profile and History Detailed Assessment- Review of Records and additional review of physical, cognitive, psychosocial history related to current functional performance    Occupational performance deficits (Please refer to evaluation for details): ADL's;IADL's;Leisure;Rest and Sleep    Body Structure / Function / Physical Skills ADL;Coordination;Endurance;GMC;UE functional use;Balance;IADL;Pain;Dexterity;FMC;Strength;Edema;Mobility;ROM    Psychosocial Skills Environmental  Adaptations;Habits;Routines and Behaviors    Rehab Potential Good    Clinical Decision Making Several treatment options, min-mod task modification necessary    Comorbidities Affecting Occupational Performance: May have comorbidities impacting occupational performance     Modification or Assistance to Complete Evaluation  Min-Moderate modification of tasks or assist with assess necessary to complete eval    OT Frequency 2x / week    OT Duration 12 weeks    OT Treatment/Interventions Self-care/ADL training;Cryotherapy;Paraffin;Therapeutic exercise;DME and/or AE instruction;Functional Mobility Training;Balance training;Electrical Stimulation;Ultrasound;Neuromuscular education;Manual Therapy;Splinting;Moist Heat;Contrast Bath;Passive range of motion;Therapeutic activities;Patient/family education;Coping strategies training    Plan OT recert    Consulted and Agree with Plan of Care Patient             Leta Speller, MS, OTR/L  Darleene Cleaver, OT 09/19/2021, 11:23 AM

## 2021-09-24 ENCOUNTER — Ambulatory Visit (INDEPENDENT_AMBULATORY_CARE_PROVIDER_SITE_OTHER): Payer: Medicare PPO

## 2021-09-24 ENCOUNTER — Ambulatory Visit: Payer: Medicare PPO | Admitting: Physical Therapy

## 2021-09-24 ENCOUNTER — Ambulatory Visit: Payer: Medicare PPO | Attending: Internal Medicine

## 2021-09-24 DIAGNOSIS — I639 Cerebral infarction, unspecified: Secondary | ICD-10-CM | POA: Diagnosis not present

## 2021-09-24 DIAGNOSIS — M6281 Muscle weakness (generalized): Secondary | ICD-10-CM | POA: Insufficient documentation

## 2021-09-24 DIAGNOSIS — I63511 Cerebral infarction due to unspecified occlusion or stenosis of right middle cerebral artery: Secondary | ICD-10-CM | POA: Insufficient documentation

## 2021-09-24 DIAGNOSIS — R262 Difficulty in walking, not elsewhere classified: Secondary | ICD-10-CM | POA: Diagnosis present

## 2021-09-24 DIAGNOSIS — R278 Other lack of coordination: Secondary | ICD-10-CM | POA: Insufficient documentation

## 2021-09-24 NOTE — Therapy (Signed)
OUTPATIENT OCCUPATIONAL THERAPY TREATMENT NOTE   Patient Name: Teresa [name] MRN: [id number] DOB:May 15, 1940, 81 y.o., female Today's Date: 09/24/2021  PCP: Dr. [name] REFERRING PROVIDER: Dr. [name]   OT End of Session - 09/24/21 1651     Visit Number 36    Number of Visits 48    Date for OT Re-Evaluation 11/08/21    OT Start Time 1100    OT Stop Time 1145    OT Time Calculation (min) 45 min    Activity Tolerance Patient tolerated treatment well    Behavior During Therapy [facility] for tasks assessed/performed             Past Medical History:  Diagnosis Date   Cancer ([place])    skin   Hypothyroidism    Raynaud's disease    Past Surgical History:  Procedure Laterality Date   [place]   BACK SURGERY  2010   Cervical fusion C4-5-6-7   CATARACT EXTRACTION, BILATERAL Bilateral 10/2016   CHOLECYSTECTOMY  1995   COLONOSCOPY WITH PROPOFOL N/A 12/08/2018   Procedure: COLONOSCOPY WITH PROPOFOL;  Surgeon: Toledo, Benay Pike, MD;  Location: [facility] ENDOSCOPY;  Service: Gastroenterology;  Laterality: N/A;   EYE SURGERY     Patient Active Problem List   Diagnosis Date Noted   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  Following inpt rehab, pt went home and had home health.   PRECAUTIONS: fall  SUBJECTIVE: "My hand is looking good."  PAIN:  Are you having pain? Yes: NPRS scale: 3/10 Pain  location: neck Pain description: sore Aggravating factors: N/A Relieving factors: rest, heat, otc pain meds     OBJECTIVE:   TODAY'S TREATMENT:  Therapeutic Exercise: Performed passive L wrist flex/ext, digit flex/ext all digits, and PROM/AAROM for L shoulder flex/abd/horiz abd/add, ER.  Performed prolonged passive stretch for ER, working to increase joint flexibility for increased functional use of LUE.  Performed AAROM with 1# dowel to complete chest press, shoulder flex, and ER to top of head x3 sets 10 reps each; cues for technique and body mechanics, and ensuring pt maintain a tighter grip on dowel with the L hand.  Completed L grip strengthening with hand gripper (1 red band for light resistance) for 3 sets 10 reps each, followed by passive wrist and digit extension stretch after each set.  Facilitated pinch strengthening for lateral and 3 point pinch using yellow and red clothespins and clipping them onto a vertical dowel.  Pt was able to pinch the green pins with effort, but not enough to clip onto the dowel but was able to remove green pins from dowel with effort and extra time.  Facilitated reaching with LUE moving clips from table top into container placed at shoulder height after removing pins from dowel.  Vc for body mechanics when reaching for pins on table top, cuing pt to extend elbow for further reach vs leaning forward from torso.  Encouraged pt have back against back rest of chair to minimize compensatory leaning.   PATIENT EDUCATION: Education details: advised spouse on proper pt handling; encouraged use of gait belt or waist of pants and avoid lifting pt from under arms. Person educated: Spouse Education method: Merchandiser, retail cues Education comprehension: verbalized understanding   HOME EXERCISE PROGRAM Continue to engage LUE into ADLs; continue gripping and pinching exercises for LUE, and L shoulder AROM/AAROM      OT Long Term Goals -       OT LONG TERM  GOAL #1   Title Pt will be independent with home exercise program.    Baseline Eval: no current program, 10th visit:  continue to add new exercises as pt progresses, 20th:  continue to update HEP; 08/17/21: continue to progress HEP when indicated    Time 12    Period Weeks    Status On-going    Target Date 11/08/21      OT LONG TERM GOAL #2   Title Pt will complete UB and LB dressing with modified independence including buttons, snaps and zippers.    Baseline requires min assist at eval, 10th visit: occasional assist with buttons, 20th:  able to perform one handed, but difficulty with bilateral UE; 08/17/21: pt reports inconsistent with 1 hand, reviewed techniques this visit    Time 12    Period Weeks    Status On-going    Target Date 11/08/21      OT LONG TERM GOAL #3   Title Pt will perform shower transfer with modified independence.    Baseline Pt requires supervision to min assist for shower transfer at home. 10th visit: supervision; 08/17/21: supv    Time 6    Period Weeks    Status Partially Met    Target Date 09/27/21      OT LONG TERM GOAL #4   Title Pt will improve L hand grip by 10# to assist with holding items in left hand securely.    Baseline no grip in left hand at eval, 10th visit:  improved flexion but still working towards composite fisting and grip. 20th:  continues to demo decreased grip; 08/17/21: active digit flexion improving, but not yet able to register grip on dynamometer; 09/04/21: L grip 1#    Time 12    Period Weeks    Status On-going    Target Date 11/08/21      OT LONG TERM GOAL #5   Title Pt will improve left shoulder flexion to 100 degrees or better to improve reaching to obtain self care items from shelf/shoulder height.    Baseline difficulty with reach, shoulder flexion to 47 degrees; 08/17/21: L shoulder flexion 85, but not yet able to consistently hold ADL supplies in L hand when reaching; 09/04/21: flexion 85    Time 12    Period Weeks    Status  On-going    Target Date 11/08/21      OT LONG TERM GOAL #6   Title Pt will improve FOTO score to 47 or above to demonstrate a clinically relevant change in function to impact ADL tasks.    Baseline score of 30 at eval; 08/17/21: FOTO: 42; 09/04/21: FOTO : 54    Time 12    Period Weeks    Status On-going    Target Date 11/08/21      OT LONG TERM GOAL #7   Title Pt will demonstrate ability to pick up small objects and complete 9 hole  peg test in less than 2 mins.    Baseline unable to perform at eval, 10th visit: still unable to pick up small pegs, 20th: unable to pick up pegs but improving; 08/17/21: not attempted d/t time constraints, will assess next visit; 09/04/21: 9 hole peg test in 5 min 53 sec    Time 12    Period Weeks    Status On-going    Target Date 11/08/21              Plan -     Clinical Impression Statement Pt acknowledged L hand swelling continues to improve, with OT providing positive reinforcement that swelling has been improving with pt engaging her L hand more into daily tasks, especially since she's had to use her walker since her ankle injury.  Pt reports R shoulder is no longer painful since spouse started using the gait belt to transfer pt as recommended by OT.  Pt is making steady improvements with grip and pinch strengthening.  Pt continues to make steady progress with engaging LUE into ADLs.  Pt will continue to benefit from skilled OT to work towards goals in plan of care to improve LUE functional use for ADL and IADL tasks.   OT Occupational Profile and History Detailed Assessment- Review of Records and additional review of physical, cognitive, psychosocial history related to current functional performance    Occupational performance deficits (Please refer to evaluation for details): ADL's;IADL's;Leisure;Rest and Sleep    Body Structure / Function / Physical Skills ADL;Coordination;Endurance;GMC;UE functional  use;Balance;IADL;Pain;Dexterity;FMC;Strength;Edema;Mobility;ROM    Psychosocial Skills Environmental  Adaptations;Habits;Routines and Behaviors    Rehab Potential Good    Clinical Decision Making Several treatment options, min-mod task modification necessary    Comorbidities Affecting Occupational Performance: May have comorbidities impacting occupational performance    Modification or Assistance to Complete Evaluation  Min-Moderate modification of tasks or assist with assess necessary to complete eval    OT Frequency 2x / week    OT Duration 12 weeks    OT Treatment/Interventions Self-care/ADL training;Cryotherapy;Paraffin;Therapeutic exercise;DME and/or AE instruction;Functional Mobility Training;Balance training;Electrical Stimulation;Ultrasound;Neuromuscular education;Manual Therapy;Splinting;Moist Heat;Contrast Bath;Passive range of motion;Therapeutic activities;Patient/family education;Coping strategies training    Plan OT recert    Consulted and Agree with Plan of Care Patient             Leta Speller, MS, OTR/L  Darleene Cleaver, OT 09/24/2021, 4:52 PM

## 2021-09-25 LAB — CUP PACEART REMOTE DEVICE CHECK
Date Time Interrogation Session: 20230702224830
Implantable Pulse Generator Implant Date: 19440924

## 2021-09-26 ENCOUNTER — Ambulatory Visit: Payer: Medicare PPO | Admitting: Occupational Therapy

## 2021-09-26 DIAGNOSIS — M6281 Muscle weakness (generalized): Secondary | ICD-10-CM | POA: Diagnosis not present

## 2021-09-26 DIAGNOSIS — R278 Other lack of coordination: Secondary | ICD-10-CM

## 2021-09-27 ENCOUNTER — Encounter: Payer: Medicare PPO | Admitting: Occupational Therapy

## 2021-09-28 ENCOUNTER — Encounter: Payer: Self-pay | Admitting: Occupational Therapy

## 2021-09-28 NOTE — Therapy (Signed)
OUTPATIENT OCCUPATIONAL THERAPY TREATMENT NOTE   Patient Name: Teresa [name] MRN: [id number] DOB:1941/01/12, 81 y.o., female Today's Date: 09/28/2021  PCP: Dr. [name] REFERRING PROVIDER: Dr. [name]   OT End of Session - 09/28/21 0947     Visit Number 37    Number of Visits 48    Date for OT Re-Evaluation 11/08/21    OT Start Time 1106    OT Stop Time 1155    OT Time Calculation (min) 49 min    Activity Tolerance Patient tolerated treatment well    Behavior During Therapy [facility] for tasks assessed/performed             Past Medical History:  Diagnosis Date   Cancer ([place])    skin   Hypothyroidism    Raynaud's disease    Past Surgical History:  Procedure Laterality Date   [place]   BACK SURGERY  2010   Cervical fusion C4-5-6-7   CATARACT EXTRACTION, BILATERAL Bilateral 10/2016   CHOLECYSTECTOMY  1995   COLONOSCOPY WITH PROPOFOL N/A 12/08/2018   Procedure: COLONOSCOPY WITH PROPOFOL;  Surgeon: Toledo, Benay Pike, MD;  Location: [facility] ENDOSCOPY;  Service: Gastroenterology;  Laterality: N/A;   EYE SURGERY     Patient Active Problem List   Diagnosis Date Noted   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  Following inpt rehab, pt went home and had home health.   PRECAUTIONS: fall  SUBJECTIVE: Pt reports she had fallen since seeing this therapist, now has a Closed fracture of posterior malleolus of right tibia, she went to the ER  and in a splint/wrap while waiting for edema to decrease.  She has a scheduled appt tomorrow with orthopaedist for evaluation.  Husband present during session this date.    PAIN:  Are you having pain? Yes: NPRS scale: 3/10 Pain location: right leg  Pain description: sore Aggravating factors: N/A Relieving factors: rest, heat, otc pain meds     OBJECTIVE:  09/26/2021  TODAY'S TREATMENT:  Therapeutic Exercise: Pt seen this date for AA/AROM for left UE from seated position with use of wedge on tabletop.  Washcloth placed under left hand and pt performing motions for forward reaching and back, side to side, "painting the 4 corners" (moving in a square), then diagonal patterns in an X formation.  Verbal cues for facilitation of normal movement patterns and decrease substitution of movements at the shoulder during task.  Use of juxtaciser in left hand with emphasis on gripping pattern as well as wrist movements to facilitate moving washer from one end of the tool to the other, occasional hand over hand from therapist required.  Resistive yellow and red pinch pins for lateral pinch with left UE, cues for prehension patterns.  Medium ball with hand on top and facilitation of forward reach, side to side movements.    Neuromuscular Reeducation:  Pt seen for manipulation of coins from flat surface with left hand, pt has to compensate and pull  coins to edge of table to pick up and place into container.  Dropping items frequently and demonstrates difficulty with smaller coins.     PATIENT EDUCATION: Education details: facilitation of reach, manipulation of coins. Person educated: Spouse Education method: Merchandiser, retail cues Education comprehension: verbalized understanding   HOME EXERCISE PROGRAM Continue to engage LUE into ADLs; continue gripping and pinching exercises for LUE, and L shoulder AROM/AAROM      OT Long Term Goals -       OT LONG TERM GOAL #1   Title Pt will be  independent with home exercise program.    Baseline Eval: no current program, 10th visit:  continue to add new exercises as pt progresses, 20th:  continue to update HEP; 08/17/21: continue to progress HEP when indicated    Time 12    Period Weeks    Status On-going    Target Date 11/08/21      OT LONG TERM GOAL #2   Title Pt will complete UB and LB dressing with modified independence including buttons, snaps and zippers.    Baseline requires min assist at eval, 10th visit: occasional assist with buttons, 20th:  able to perform one handed, but difficulty with bilateral UE; 08/17/21: pt reports inconsistent with 1 hand, reviewed techniques this visit    Time 12    Period Weeks    Status On-going    Target Date 11/08/21      OT LONG TERM GOAL #3   Title Pt will perform shower transfer with modified independence.    Baseline Pt requires supervision to min assist for shower transfer at home. 10th visit: supervision; 08/17/21: supv    Time 6    Period Weeks    Status Partially Met    Target Date 09/27/21      OT LONG TERM GOAL #4   Title Pt will improve L hand grip by 10# to assist with holding items in left hand securely.    Baseline no grip in left hand at eval, 10th visit:  improved flexion but still working towards composite fisting and grip. 20th:  continues to demo decreased grip; 08/17/21: active digit flexion improving, but not yet able to register grip on dynamometer; 09/04/21: L grip 1#    Time 12    Period Weeks    Status On-going    Target Date 11/08/21      OT LONG TERM GOAL #5   Title Pt will improve left shoulder flexion to 100 degrees or better to improve reaching to obtain self care items from shelf/shoulder height.    Baseline difficulty with reach, shoulder flexion to 47 degrees; 08/17/21: L shoulder flexion 85, but not yet able to consistently hold ADL supplies in L hand when reaching; 09/04/21: flexion 85    Time 12    Period Weeks    Status On-going    Target Date  11/08/21      OT LONG TERM GOAL #6   Title Pt will improve FOTO score to 47 or above to demonstrate a clinically relevant change in function to impact ADL tasks.    Baseline score of 30 at eval; 08/17/21: FOTO: 42; 09/04/21: FOTO : 54    Time 12    Period Weeks    Status On-going    Target Date 11/08/21      OT LONG TERM GOAL #7   Title Pt will demonstrate ability to pick up small objects and complete 9 hole peg test in less than  2 mins.    Baseline unable to perform at eval, 10th visit: still unable to pick up small pegs, 20th: unable to pick up pegs but improving; 08/17/21: not attempted d/t time constraints, will assess next visit; 09/04/21: 9 hole peg test in 5 min 53 sec    Time 12    Period Weeks    Status On-going    Target Date 11/08/21              Plan -     Clinical Impression Statement Pt with recent Closed fracture of posterior malleolus of right tibia of right leg and unable to perform functional mobility and tasks in standing until after she sees the ortho doctor tomorrow for further evaluation.  Pt continues to progress with left UE ROM, strength and function.  Improved movement patterns with AAROM tabletop on wedge this date, occasional cues to decrease substitution of movements and/or hiking of shoulder.  Pt demonstrates ability now to pick up coins but has difficulty with picking up from flat surface and requires pulling to edge of table.  She is able to place a couple in resistive bank and the rest in open container.  Continue to work towards goals in plan of care to maximize safety and independence in necessary daily tasks.  Will await further instructions on plan for right LE fracture after MD appt tomorrow.    OT Occupational Profile and History Detailed Assessment- Review of Records and additional review of physical, cognitive, psychosocial history related to current functional performance    Occupational performance deficits (Please refer to evaluation for details):  ADL's;IADL's;Leisure;Rest and Sleep    Body Structure / Function / Physical Skills ADL;Coordination;Endurance;GMC;UE functional use;Balance;IADL;Pain;Dexterity;FMC;Strength;Edema;Mobility;ROM    Psychosocial Skills Environmental  Adaptations;Habits;Routines and Behaviors    Rehab Potential Good    Clinical Decision Making Several treatment options, min-mod task modification necessary    Comorbidities Affecting Occupational Performance: May have comorbidities impacting occupational performance    Modification or Assistance to Complete Evaluation  Min-Moderate modification of tasks or assist with assess necessary to complete eval    OT Frequency 2x / week    OT Duration 12 weeks    OT Treatment/Interventions Self-care/ADL training;Cryotherapy;Paraffin;Therapeutic exercise;DME and/or AE instruction;Functional Mobility Training;Balance training;Electrical Stimulation;Ultrasound;Neuromuscular education;Manual Therapy;Splinting;Moist Heat;Contrast Bath;Passive range of motion;Therapeutic activities;Patient/family education;Coping strategies training    Plan OT recert    Consulted and Agree with Plan of Care Patient             Tishara Pizano Oneita Jolly, OTR/L, CLT  Makenzie Weisner, OT 09/28/2021, 10:10 AM

## 2021-10-01 ENCOUNTER — Ambulatory Visit: Payer: Medicare PPO | Admitting: Occupational Therapy

## 2021-10-01 ENCOUNTER — Ambulatory Visit: Payer: Medicare PPO

## 2021-10-01 DIAGNOSIS — R262 Difficulty in walking, not elsewhere classified: Secondary | ICD-10-CM

## 2021-10-01 DIAGNOSIS — M6281 Muscle weakness (generalized): Secondary | ICD-10-CM

## 2021-10-01 DIAGNOSIS — R278 Other lack of coordination: Secondary | ICD-10-CM

## 2021-10-01 NOTE — Therapy (Addendum)
[place] [facility] [address] [place], [place], [address] Phone: [phone]   Fax:  [phone]  Patient Details  Name: Teresa [name] MRN: [id number] Date of Birth: 09-03-1940 Referring Provider:  Idelle Crouch, MD  Encounter Date: 10/01/2021   OUTPATIENT PHYSICAL THERAPY TREATMENT NOTE/Discharge Summary  Patient Name: Teresa [name] MRN: [id number] DOB:[birth year], 81 y.o., female Today's Date: 10/01/2021  PCP: Idelle Crouch, MD REFERRING PROVIDER: Idelle Crouch, MD   PT End of Session - 10/01/21 1207     Visit Number 32    Number of Visits 40    Date for PT Re-Evaluation 10/12/21    Authorization Type [employer] PPO-    Authorization Time Period cert 5/40/0867-09/11/5091    PT Start Time 1147    PT Stop Time 2671    PT Time Calculation (min) 9 min    Activity Tolerance Patient tolerated treatment well;No increased pain    Behavior During Therapy WFL for tasks assessed/performed               Past Medical History:  Diagnosis Date   Cancer ([place])    skin   Hypothyroidism    Raynaud's disease    Past Surgical History:  Procedure Laterality Date   ABDOMINAL HYSTERECTOMY  1991   APPENDECTOMY  1991   BACK SURGERY  2010   Cervical fusion C4-5-6-7   CATARACT EXTRACTION, BILATERAL Bilateral 10/2016   CHOLECYSTECTOMY  1995   COLONOSCOPY WITH PROPOFOL N/A 12/08/2018   Procedure: COLONOSCOPY WITH PROPOFOL;  Surgeon: Toledo, Benay Pike, MD;  Location: [facility] ENDOSCOPY;  Service: Gastroenterology;  Laterality: N/A;   EYE SURGERY     Patient Active Problem List   Diagnosis Date Noted   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 Pt completed CIR from 03/02/21-03/22/21. PMH includes: skin cancer, hypothyroidism, and Raynaud's disease, history of cervical fusion in 2010 C4-C7. Pt reports HH PT/OT/SLP was performed from January 2nd to February 28th with focus on ADL completion, ambulation. Pt's big goal tranisitioning to ambulating up to 3 miles a day which pt was doing prior to stroke. Pt has been ambulating in [employer] with husband but last day or two pt has noticed increased L foot drag but states it is better today. Pt denies any falls at home or community. Pt reports balance deficits with turns primarily. States her balance is good with her quad cane. Wishing to return to full independence in ambulation with household and community ambulation. Wishing to be able to asc/desc full flight of stairs with ability to carry decorative items and other household items without need for handrail and/or AD.  PRECAUTIONS: fall  SUBJECTIVE: Pt accompanied by spouse to PT. Pt in transport chair. Pt recently had follow-up with PA on 09/27/2021 for recent closed traumatic nondisplaced fracture of posterior malleolus of right tibia. Pt wearing R boot and per pt is to be wearing boot for next 4 weeks. She is WBAT and reports has been ambulating for limited distances at home with her QC. Pain in RLE is 3/10. She has  another follow-up with her physician for her ankle in 2 weeks.   PAIN:  Are you having pain? Yes, R LE, currently 3/10.     TODAY'S TREATMENT:    10/01/2021: UNBILLED. No treatment provided on this date. Instructed pt to seek new PT referral for RLE in order to address current mobility limitations. Pt and her spouse verbalize understanding, agreeable to plan.   PATIENT EDUCATION: Education details: Education pt on plan. Prior: Pt educated throughout session  about proper posture and technique with exercises. Improved exercise technique, movement at target joints, use of target muscles after min to mod verbal, visual, tactile cues.  Person educated: Patient Education method: Explanation Education comprehension: verbalized understanding   HOME EXERCISE PROGRAM: no updates 10/01/2021  6/26: Access Code: [email or web address] URL: [email or web address] Date: 09/17/2021 Prepared by: Ricard Dillon  Exercises - Sitting Knee Extension with Resistance  - 1 x daily - 5 x weekly - 2 sets - 15 reps - perform on LLE only - Seated Hamstring Curl with Anchored Resistance  - 1 x daily - 5 x weekly - 2 sets - 15 reps - perform on LLE only - Seated March with Resistance  - 1 x daily - 5 x weekly - 2 sets - 15 reps - Seated Hip Abduction with Resistance  - 1 x daily - 5 x weekly - 2 sets - 15 reps  Seated L ankle DF; 3/10 Access Code: [email or web address]; 5/10: Access Code: [email or web address]; no updates as of 09/12/21;     PT Short Term Goals -       PT SHORT TERM GOAL #1   Title Pt will be indep with HEP to improve balance, strength, and gait to optimize independence with ADL completion    Baseline 05/24/21: Initiated 4/7: to be advanced; 5/12: to be advanced; 6/19: to be advanced    Time 6    Period Weeks    Status On-going    Target Date 09/14/21              PT Long Term Goals -       PT LONG TERM GOAL #1   Title Pt will improve FOTO to target score to 67 demonstrate clinically significant improvement in functional mobility    Baseline 05/24/21: next session 4/7: 64; 5/12: 70; 5/26: 72%    Time 12    Period Weeks    Status Achieved    Target Date 08/02/21      PT LONG TERM GOAL #2   Title Pt will improve 5xSTS to 12 sec or less to indicate clinically significant improvement in LE strength    Baseline 05/24/21: 17.51 sec 4/7: 14.2 sec hands-free; 5/12: 10.3 sec hands-free    Time 12    Period Weeks    Status Achieved    Target Date 08/16/21      PT LONG  TERM GOAL #3   Title Pt will improve 6 MWT by 165' to indicate clinically significant improvement in community ambulation distance for geriatric stroke norms.    Baseline 05/24/21: 765' 4/6: 842 ft with SPC; 5/12: 1060 ft without AD;    Time 12    Period Weeks    Status Achieved    Target Date 08/16/21      PT LONG TERM GOAL #4   Title Pt will improve 10 m gait speed to at least 1.0 m/s to demonstrate reduced risk of falls with community ambulation tasks.    Baseline 05/24/21: .63 m/s without AD 4/6: 0.83 without  an AD; 5/12: 0.93 m/s; 5/26: 1.16 m/s    Time 12    Period Weeks    Status Achieved    Target Date 08/16/21      PT LONG TERM GOAL #5   Title Pt will improve FGA  by at least 5 point to demonstrate clinically signifcant reduced risk of falls with dynamic balance/ambulation tasks.    Baseline 05/24/21: 17; 4/7: 22    Time 12    Period Weeks    Status Achieved    Target Date 08/16/21      PT LONG TERM GOAL #6   Title Pt will improve 6 MWT by 165' to indicate clinically significant improvement in community ambulation distance for geriatric stroke norms.    Baseline 5/12: 5/12: 1060 ft without AD; 5/26: 1178 ft no AD; 1177 ft no AD; 7/10: currently limited due to R ankle fx   Time 8    Period Weeks    Status Partially Met    Target Date 10/12/21      PT LONG TERM GOAL #7   Title Patient will tolerate 5 seconds of single leg stance bilat without loss of balance to improve ability to get in and out of shower safely.    Baseline 5/12: 3-4 sec bilat; 5/26: L 21 seconds, R 11 seconds    Time 12    Period Weeks    Status Achieved    Target Date 10/26/21      PT LONG TERM GOAL #8   Title Pt will report a 50% improvement in steadiness with turning to walk into and out of rooms in her home in order to reduce fall risk.    Baseline 5/26: pt reports unsteadiness 75% of the time; 6/19: pt feels unsteady 50% of the time; 7/10: pt with fall and fx posterior malleolus of R tibia on 6/24    Time 8    Period Weeks    Status NOT MET    Target Date 10/12/21              Plan -     Clinical Impression Statement No interventions provided on this date, however, PT did discuss with pt and her spouse to seek new PT referral to address mobility limitations due to recent R ankle fracture. Pt and her spouse verbalized understanding. Pt is to be discharged from current plan of care on this date, but would benefit from further skilled PT to address R ankle fx once new referral provided. Pt agreeable to plan.    Personal Factors and Comorbidities Age;Time since onset of injury/illness/exacerbation;Comorbidity 3+;Past/Current Experience    Examination-Activity Limitations Squat;Stairs;Locomotion Level;Stand    Examination-Participation Restrictions Shop;Community Activity;Yard Work    Merchant navy officer Evolving/Moderate complexity    Rehab Potential Good    PT Frequency 2x / week    PT Duration 12 weeks    PT Treatment/Interventions ADLs/Self Care Home Management;Canalith Repostioning;Electrical Stimulation;DME Instruction;Gait training;Stair training;Functional mobility training;Therapeutic activities;Patient/family education;Therapeutic exercise;Balance training;Neuromuscular re-education;Energy conservation;Vestibular;Passive range of motion    PT Next Visit Plan Endurance, strengthening, balance, incorproation of dual tasks into exercises, continue with POC    PT Home Exercise Plan Seated L ankle DF; 3/10 Access Code: [email or web address]; 5/10:  Access Code: [email or web address]; no updates    Consulted and Agree with Plan of Care Patient               Zollie Pee, PT 10/01/2021, 12:09 PM  [address]  [place], [place], [address] Phone: [phone]   Fax:  [phone]

## 2021-10-02 ENCOUNTER — Encounter: Payer: Medicare PPO | Admitting: Occupational Therapy

## 2021-10-03 ENCOUNTER — Ambulatory Visit: Payer: Medicare PPO | Admitting: Physical Therapy

## 2021-10-03 ENCOUNTER — Ambulatory Visit: Payer: Medicare PPO | Admitting: Occupational Therapy

## 2021-10-03 DIAGNOSIS — M6281 Muscle weakness (generalized): Secondary | ICD-10-CM

## 2021-10-03 DIAGNOSIS — R278 Other lack of coordination: Secondary | ICD-10-CM

## 2021-10-03 DIAGNOSIS — I63511 Cerebral infarction due to unspecified occlusion or stenosis of right middle cerebral artery: Secondary | ICD-10-CM

## 2021-10-04 ENCOUNTER — Ambulatory Visit: Payer: Medicare PPO | Admitting: Physical Therapy

## 2021-10-06 ENCOUNTER — Encounter: Payer: Self-pay | Admitting: Occupational Therapy

## 2021-10-06 NOTE — Therapy (Signed)
OUTPATIENT OCCUPATIONAL THERAPY TREATMENT NOTE   Patient Name: Teresa [name] MRN: [id number] DOB:Sep 16, 1940, 81 y.o., female Today's Date: 10/06/2021  PCP: Dr. [name] REFERRING PROVIDER: Dr. [name]   OT End of Session - 10/06/21 2120     Visit Number [id number]    Number of Visits 48    Date for OT Re-Evaluation 11/08/21    OT Start Time 1100    OT Stop Time 1146    OT Time Calculation (min) 46 min    Activity Tolerance Patient tolerated treatment well    Behavior During Therapy [facility] for tasks assessed/performed             Past Medical History:  Diagnosis Date   Cancer ([place])    skin   Hypothyroidism    Raynaud's disease    Past Surgical History:  Procedure Laterality Date   [place]   BACK SURGERY  2010   Cervical fusion C4-5-6-7   CATARACT EXTRACTION, BILATERAL Bilateral 10/2016   CHOLECYSTECTOMY  1995   COLONOSCOPY WITH PROPOFOL N/A 12/08/2018   Procedure: COLONOSCOPY WITH PROPOFOL;  Surgeon: Toledo, Benay Pike, MD;  Location: [facility] ENDOSCOPY;  Service: Gastroenterology;  Laterality: N/A;   EYE SURGERY     Patient Active Problem List   Diagnosis Date Noted   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  Following inpt rehab, pt went home and had home health.   PRECAUTIONS: fall  SUBJECTIVE:   Has boot now and not doing PT until boot is removed   PAIN:  Are you having pain? Yes:  NPRS scale: 3/10 Pain location: right leg  Pain description: sore Aggravating factors: N/A Relieving factors: rest, heat, otc pain meds     OBJECTIVE:    TODAY'S TREATMENT:  Therapeutic Exercise: Pt seen for reaching tasks from seated position with left UE, multiple planes of motion. Pinch strengthening for left UE with use of Resistive pins yellow, red green with difficulty with placing but able to remove from side of container once placed.   Therapist demonstration and cues for proper pinch patterns.    Neuromuscular Reeducation:  Pt seen for coordination skills with use of Connect 4 with checker pieces to pick up one at a time and placing into grid.  Advanced to picking up one and moving to palm, holding while grasping another at finger tips to place into board.  Attempted moderate resistance board with push pins, able to place 8, removed 15 with increased effort and mild difficulty  Med cylindrical pegs, turning and flipping 8 then stacking one on the other with effort.  Pt required cues for prehension patterns with the above tasks.     PATIENT EDUCATION: Education details: facilitation of reach, manipulation of coins. Person educated: Spouse Education method: Explanation and Verbal cues Education comprehension: verbalized understanding, demonstration   HOME EXERCISE PROGRAM Continue to engage LUE into ADLs; continue gripping and pinching exercises for LUE, and L  shoulder AROM/AAROM      OT Long Term Goals -       OT LONG TERM GOAL #1   Title Pt will be independent with home exercise program.    Baseline Eval: no current program, 10th visit:  continue to add new exercises as pt progresses, 20th:  continue to update HEP; 08/17/21: continue to progress HEP when indicated    Time 12    Period Weeks    Status On-going    Target Date 11/08/21      OT LONG TERM GOAL #2   Title Pt will complete UB and LB dressing with modified independence including buttons, snaps  and zippers.    Baseline requires min assist at eval, 10th visit: occasional assist with buttons, 20th:  able to perform one handed, but difficulty with bilateral UE; 08/17/21: pt reports inconsistent with 1 hand, reviewed techniques this visit    Time 12    Period Weeks    Status On-going    Target Date 11/08/21      OT LONG TERM GOAL #3   Title Pt will perform shower transfer with modified independence.    Baseline Pt requires supervision to min assist for shower transfer at home. 10th visit: supervision; 08/17/21: supv    Time 6    Period Weeks    Status Partially Met    Target Date 09/27/21      OT LONG TERM GOAL #4   Title Pt will improve L hand grip by 10# to assist with holding items in left hand securely.    Baseline no grip in left hand at eval, 10th visit:  improved flexion but still working towards composite fisting and grip. 20th:  continues to demo decreased grip; 08/17/21: active digit flexion improving, but not yet able to register grip on dynamometer; 09/04/21: L grip 1#    Time 12    Period Weeks    Status On-going    Target Date 11/08/21      OT LONG TERM GOAL #5   Title Pt will improve left shoulder flexion to 100 degrees or better to improve reaching to obtain self care items from shelf/shoulder height.    Baseline difficulty with reach, shoulder flexion to 47 degrees; 08/17/21: L shoulder flexion 85, but not yet able to consistently hold ADL supplies in L hand when reaching; 09/04/21: flexion 85    Time 12    Period Weeks    Status On-going    Target Date 11/08/21      OT LONG TERM GOAL #6   Title Pt will improve FOTO score to 47 or above to demonstrate a clinically relevant change in function to impact ADL tasks.    Baseline score of 30 at eval; 08/17/21: FOTO: 42; 09/04/21: FOTO : 54    Time 12    Period Weeks    Status On-going    Target Date 11/08/21      OT LONG TERM GOAL #7   Title Pt will demonstrate ability to pick up small objects and complete 9 hole peg  test in less than 2 mins.    Baseline unable to perform at eval, 10th visit: still unable to pick up small pegs, 20th: unable to pick up pegs but improving; 08/17/21: not attempted d/t time constraints, will assess next visit; 09/04/21: 9 hole peg test in 5 min 53 sec    Time 12    Period Weeks    Status On-going    Target Date 11/08/21  Plan -     Clinical Impression Statement Pt demonstrating improvements in speed, dexterity with manipulation of items this date.  Precision of movements improving when reaching and picking up items.  Pinch strength continues to demonstrate improvements and now able to demonstrate advancing to green pinch pins to remove from container.  Continue to work towards left UE improved function for ADL and IADL tasks.     OT Occupational Profile and History Detailed Assessment- Review of Records and additional review of physical, cognitive, psychosocial history related to current functional performance    Occupational performance deficits (Please refer to evaluation for details): ADL's;IADL's;Leisure;Rest and Sleep    Body Structure / Function / Physical Skills ADL;Coordination;Endurance;GMC;UE functional use;Balance;IADL;Pain;Dexterity;FMC;Strength;Edema;Mobility;ROM    Psychosocial Skills Environmental  Adaptations;Habits;Routines and Behaviors    Rehab Potential Good    Clinical Decision Making Several treatment options, min-mod task modification necessary    Comorbidities Affecting Occupational Performance: May have comorbidities impacting occupational performance    Modification or Assistance to Complete Evaluation  Min-Moderate modification of tasks or assist with assess necessary to complete eval    OT Frequency 2x / week    OT Duration 12 weeks    OT Treatment/Interventions Self-care/ADL training;Cryotherapy;Paraffin;Therapeutic exercise;DME and/or AE instruction;Functional Mobility Training;Balance training;Electrical  Stimulation;Ultrasound;Neuromuscular education;Manual Therapy;Splinting;Moist Heat;Contrast Bath;Passive range of motion;Therapeutic activities;Patient/family education;Coping strategies training    Plan OT recert    Consulted and Agree with Plan of Care Patient             Teneshia Hedeen Colette Ribas, OT 10/06/2021, 9:21 PM

## 2021-10-06 NOTE — Therapy (Signed)
OUTPATIENT OCCUPATIONAL THERAPY TREATMENT NOTE   Patient Name: Teresa [name] MRN: [id number] DOB:[birth year], 81 y.o., female Today's Date: 10/06/2021  PCP: Dr. [name] REFERRING PROVIDER: Dr. [name]   OT End of Session - 10/06/21 2011     Visit Number 38    Number of Visits 48    Date for OT Re-Evaluation 11/08/21    OT Start Time 1100    OT Stop Time 1145    OT Time Calculation (min) 45 min    Activity Tolerance Patient tolerated treatment well    Behavior During Therapy [facility] for tasks assessed/performed             Past Medical History:  Diagnosis Date   Cancer ([place])    skin   Hypothyroidism    Raynaud's disease    Past Surgical History:  Procedure Laterality Date   [place]   BACK SURGERY  2010   Cervical fusion C4-5-6-7   CATARACT EXTRACTION, BILATERAL Bilateral 10/2016   CHOLECYSTECTOMY  1995   COLONOSCOPY WITH PROPOFOL N/A 12/08/2018   Procedure: COLONOSCOPY WITH PROPOFOL;  Surgeon: Toledo, Benay Pike, MD;  Location: [facility] ENDOSCOPY;  Service: Gastroenterology;  Laterality: N/A;   EYE SURGERY     Patient Active Problem List   Diagnosis Date Noted   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  Following inpt rehab, pt went home and had home health.   PRECAUTIONS: fall  SUBJECTIVE:     PAIN:  Are you having pain? Yes: NPRS scale: 3/10 Pain location: right leg  Pain description: sore Aggravating factors:  N/A Relieving factors: rest, heat, otc pain meds     OBJECTIVE:    TODAY'S TREATMENT:  Therapeutic Exercise: Pt seen for left UE strengthening exercises with use of weighted 1# dowel for overhead press, chest press, ABD, ADD, forwards and backwards circles, elbow flexion/extension, dowel climbing, 2 sets of 10 reps each with therapist demo and cues for proper form and technique.   Grip strengthening with use of 6# with assist from therapist to stabilize and hold hand gripper, worked towards sustained gripping patterns.     Neuromuscular Reeducation:  Pt seen for coordination task with use of Plumbers pipes to assemble pipes with a variety types of connectors, use of bilateral UEs but cues to utilize left hand as lead of task.  Pt with improved dexterity and speed with task this date.  When reaching for pieces from container, she is able to clear her fingers from container and no longer dragging hand across container.    PATIENT EDUCATION: Education details: facilitation of reach, manipulation of coins. Person educated: Spouse Education method: Explanation and Verbal cues Education comprehension: verbalized understanding   HOME EXERCISE PROGRAM Continue to engage LUE into ADLs; continue gripping and pinching exercises for LUE, and L shoulder AROM/AAROM      OT Long Term Goals -       OT LONG TERM GOAL #1   Title Pt will be  independent with home exercise program.    Baseline Eval: no current program, 10th visit:  continue to add new exercises as pt progresses, 20th:  continue to update HEP; 08/17/21: continue to progress HEP when indicated    Time 12    Period Weeks    Status On-going    Target Date 11/08/21      OT LONG TERM GOAL #2   Title Pt will complete UB and LB dressing with modified independence including buttons, snaps and zippers.    Baseline requires min assist at eval, 10th visit: occasional assist with buttons, 20th:  able to perform one handed, but difficulty  with bilateral UE; 08/17/21: pt reports inconsistent with 1 hand, reviewed techniques this visit    Time 12    Period Weeks    Status On-going    Target Date 11/08/21      OT LONG TERM GOAL #3   Title Pt will perform shower transfer with modified independence.    Baseline Pt requires supervision to min assist for shower transfer at home. 10th visit: supervision; 08/17/21: supv    Time 6    Period Weeks    Status Partially Met    Target Date 09/27/21      OT LONG TERM GOAL #4   Title Pt will improve L hand grip by 10# to assist with holding items in left hand securely.    Baseline no grip in left hand at eval, 10th visit:  improved flexion but still working towards composite fisting and grip. 20th:  continues to demo decreased grip; 08/17/21: active digit flexion improving, but not yet able to register grip on dynamometer; 09/04/21: L grip 1#    Time 12    Period Weeks    Status On-going    Target Date 11/08/21      OT LONG TERM GOAL #5   Title Pt will improve left shoulder flexion to 100 degrees or better to improve reaching to obtain self care items from shelf/shoulder height.    Baseline difficulty with reach, shoulder flexion to 47 degrees; 08/17/21: L shoulder flexion 85, but not yet able to consistently hold ADL supplies in L hand when reaching; 09/04/21: flexion 85    Time 12    Period Weeks    Status On-going    Target Date 11/08/21      OT LONG TERM GOAL #6   Title Pt will improve FOTO score to 47 or above to demonstrate a clinically relevant change in function to impact ADL tasks.    Baseline score of 30 at eval; 08/17/21: FOTO: 42; 09/04/21: FOTO : 54    Time 12    Period Weeks    Status On-going    Target Date 11/08/21      OT LONG TERM GOAL #7   Title Pt will demonstrate ability to pick up small objects and complete 9 hole peg test in less than 2 mins.    Baseline unable to perform at eval, 10th visit: still unable to pick up small pegs, 20th: unable to pick up pegs but  improving; 08/17/21: not attempted d/t time constraints, will assess next visit; 09/04/21: 9 hole peg test in 5 min 53 sec    Time 12    Period Weeks    Status On-going    Target Date 11/08/21              Plan -     Clinical Impression Statement Pt has continued to progress well with  left UE ROM, strength and coordination.  Pt demonstrating improved reaching patterns, able to lift and clear hand/fingers and arm from containers with obtaining items.  Grip improved and able to perform resistive hand gripper with therapist helping to hold and stabilize gripper.  Continue to work towards goals in plan of care to improve independence in necessary daily tasks.    OT Occupational Profile and History Detailed Assessment- Review of Records and additional review of physical, cognitive, psychosocial history related to current functional performance    Occupational performance deficits (Please refer to evaluation for details): ADL's;IADL's;Leisure;Rest and Sleep    Body Structure / Function / Physical Skills ADL;Coordination;Endurance;GMC;UE functional use;Balance;IADL;Pain;Dexterity;FMC;Strength;Edema;Mobility;ROM    Psychosocial Skills Environmental  Adaptations;Habits;Routines and Behaviors    Rehab Potential Good    Clinical Decision Making Several treatment options, min-mod task modification necessary    Comorbidities Affecting Occupational Performance: May have comorbidities impacting occupational performance    Modification or Assistance to Complete Evaluation  Min-Moderate modification of tasks or assist with assess necessary to complete eval    OT Frequency 2x / week    OT Duration 12 weeks    OT Treatment/Interventions Self-care/ADL training;Cryotherapy;Paraffin;Therapeutic exercise;DME and/or AE instruction;Functional Mobility Training;Balance training;Electrical Stimulation;Ultrasound;Neuromuscular education;Manual Therapy;Splinting;Moist Heat;Contrast Bath;Passive range of motion;Therapeutic  activities;Patient/family education;Coping strategies training    Plan OT recert    Consulted and Agree with Plan of Care Patient             Cheryllynn Sarff Colette Ribas, OT 10/06/2021, 8:12 PM

## 2021-10-08 ENCOUNTER — Ambulatory Visit: Payer: Medicare PPO | Admitting: Occupational Therapy

## 2021-10-08 ENCOUNTER — Ambulatory Visit: Payer: Medicare PPO

## 2021-10-08 DIAGNOSIS — I63511 Cerebral infarction due to unspecified occlusion or stenosis of right middle cerebral artery: Secondary | ICD-10-CM

## 2021-10-08 DIAGNOSIS — M6281 Muscle weakness (generalized): Secondary | ICD-10-CM | POA: Diagnosis not present

## 2021-10-08 DIAGNOSIS — R278 Other lack of coordination: Secondary | ICD-10-CM

## 2021-10-10 ENCOUNTER — Ambulatory Visit: Payer: Medicare PPO

## 2021-10-10 ENCOUNTER — Ambulatory Visit: Payer: Medicare PPO | Admitting: Physical Therapy

## 2021-10-10 DIAGNOSIS — R278 Other lack of coordination: Secondary | ICD-10-CM

## 2021-10-10 DIAGNOSIS — M6281 Muscle weakness (generalized): Secondary | ICD-10-CM | POA: Diagnosis not present

## 2021-10-10 DIAGNOSIS — I63511 Cerebral infarction due to unspecified occlusion or stenosis of right middle cerebral artery: Secondary | ICD-10-CM

## 2021-10-10 NOTE — Therapy (Signed)
OUTPATIENT OCCUPATIONAL THERAPY TREATMENT NOTE/Progress Update Reporting period from to 10/08/2021   Patient Name: Teresa [name] MRN: [id number] DOB:12-14-1940, 81 y.o., female Today's Date: 10/08/2021  PCP: Dr. [name] REFERRING PROVIDER: Dr. [name]   OT End of Session - 10/10/21 1133     Visit Number 40    Number of Visits 48    Date for OT Re-Evaluation 11/08/21    OT Start Time 2585    OT Stop Time 1100    OT Time Calculation (min) 45 min    Activity Tolerance Patient tolerated treatment well    Behavior During Therapy [facility] for tasks assessed/performed             Past Medical History:  Diagnosis Date   Cancer ([place])    skin   Hypothyroidism    Raynaud's disease    Past Surgical History:  Procedure Laterality Date   [place]   BACK SURGERY  2010   Cervical fusion C4-5-6-7   CATARACT EXTRACTION, BILATERAL Bilateral 10/2016   CHOLECYSTECTOMY  1995   COLONOSCOPY WITH PROPOFOL N/A 12/08/2018   Procedure: COLONOSCOPY WITH PROPOFOL;  Surgeon: Toledo, Benay Pike, MD;  Location: [facility] ENDOSCOPY;  Service: Gastroenterology;  Laterality: N/A;   EYE SURGERY     Patient Active Problem List   Diagnosis Date Noted   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   Stroke ([place]) 02/27/2021   History of nonmelanoma skin cancer 05/23/2014    ONSET DATE: 02/26/2021  REFERRING DIAG: R MCA CVA  THERAPY DIAG:  Muscle weakness (generalized)  Other lack of coordination  Right middle cerebral artery stroke [facility])  Rationale for Evaluation and Treatment Rehabilitation  PERTINENT HISTORY: February 26, 2021, pt reports she had a CVA, came to [facility] to ER and then was transferred to [facility] in [place] where she was admitted and after acute care she went to inpatient rehab.  Following inpt rehab, pt went home and had home health.   PRECAUTIONS: fall  SUBJECTIVE:   Pt reports she is following up with  the doctor tomorrow and having another xray to determine if she will have to continue to wear the large boot.  She is unsure when she will restart PT, it all depends on the outcome of her doctor appt and progress.  Pt reports she is now having some shoulder pain in her right unaffected side potentially from overuse and with using the cane.    PAIN:  Are you having pain? Yes: NPRS scale: 3/10 Pain location: right leg  Pain description: sore Aggravating factors: N/A Relieving factors: rest, heat, otc pain meds     OBJECTIVE:    TODAY'S TREATMENT:  Therapeutic Exercise: Pt seen this date for reassessment of hand skills and left UE function, see below flowsheet for details   [facility] OT Assessment - 10/10/21 1138       Coordination   Left 9 Hole Peg Test unable to complete this date, was able to complete 6 of 9 pegs in 4 mins 20 secs.      Hand Function   Right Hand Grip (lbs) 48    Right Hand Lateral Pinch 12 lbs    Right Hand 3 Point Pinch 16 lbs    Left Hand Grip (lbs) 5    Left Hand Lateral Pinch 5 lbs    Left 3 point pinch 3 lbs             Pt reports  cold fingers in left hand this date, complicated with Raynaud's syndrome.  She demonstrated difficulty with completion of the 9 hole peg test this date, was able to place 6/9 pegs in 4 mins and 20 secs but reported hand fatigue, coldness in fingers and frustration and requested to end task.   Grip strengthening with hand gripper on 1st setting of 6#, therapist helping to hold and guide gripper to pick up pegs to move from board to bucket.  Rest breaks as needed.  Limited reaching this date due to pain in bilateral shoulders.   ADL:  Pt able to complete UB and LB dressing with modified independence with the exception of her bra, she mostly utilizes clothing that has elastic waist or pull on items without buttons and zippers.  She did report being able to complete buttons recently with one of her shirts.  She does continue to require  assistance with her bra.  She had been progressing with her shower transfers and completing without assistance however since her recent fall and now wearing a boot, she requires min guard to supervision for shower transfers for safety.    Neuromuscular Reeducation:  Pt seen for manipulation of [place] discs this date for turning, flipping, stacking of discs with use of left hand.  Occasional cues for prehension and grasping patterns to promote isolated finger movements for turning/flipping.  Pt's speed and dexterity continue to improve with manipulation tasks.      PATIENT EDUCATION: Education details: facilitation of reach, manipulation of coins. Person educated: Spouse Education method: Merchandiser, retail cues Education comprehension: verbalized understanding, demonstration   HOME EXERCISE PROGRAM Continue to engage LUE into ADLs; continue gripping and pinching exercises for LUE, and L shoulder AROM/AAROM      OT Long Term Goals -       OT LONG TERM GOAL #1   Title Pt will be independent with home exercise program.    Baseline Eval: no current program, 10th visit:  continue to add new exercises as pt progresses, 20th:  continue to update HEP; 08/17/21: continue to progress HEP when indicated.  Visit 40: adding new exercises as pt progresses   Time 12    Period Weeks    Status On-going    Target Date 11/08/21      OT LONG TERM GOAL #2   Title Pt will complete UB and LB dressing with modified independence including buttons, snaps and zippers.    Baseline requires min assist at eval, 10th visit: occasional assist with buttons, 20th:  able to perform one handed, but difficulty with bilateral UE; 08/17/21: pt reports inconsistent with 1 hand, reviewed techniques this visit, Visit 40:  Pt requires assist with bra   Time 12    Period Weeks    Status On-going    Target Date 11/08/21      OT LONG TERM GOAL #3   Title Pt will perform shower transfer with modified independence.     Baseline Pt requires supervision to min assist for shower transfer at home. 10th visit: supervision; 08/17/21: supv .  Visit 40:  Pt had met goal but had recent fall and now requiring min guard to supv   Time 6    Period Weeks    Status Partially Met    Target Date 09/27/21      OT LONG TERM GOAL #4   Title Pt will improve L hand grip by 10# to assist with holding items in left hand securely.    Baseline  no grip in left hand at eval, 10th visit:  improved flexion but still working towards composite fisting and grip. 20th:  continues to demo decreased grip; 08/17/21: active digit flexion improving, but not yet able to register grip on dynamometer; 09/04/21: L grip 1# 7/17:  5#    Time 12    Period Weeks    Status On-going    Target Date 11/08/21      OT LONG TERM GOAL #5   Title Pt will improve left shoulder flexion to 100 degrees or better to improve reaching to obtain self care items from shelf/shoulder height.    Baseline difficulty with reach, shoulder flexion to 47 degrees; 08/17/21: L shoulder flexion 85, but not yet able to consistently hold ADL supplies in L hand when reaching; 09/04/21: flexion 85 7/17:  shoulder flexion to 90   Time 12    Period Weeks    Status On-going    Target Date 11/08/21      OT LONG TERM GOAL #6   Title Pt will improve FOTO score to 47 or above to demonstrate a clinically relevant change in function to impact ADL tasks.    Baseline score of 30 at eval; 08/17/21: FOTO: 42; 09/04/21: FOTO : 54 , FOTO 7/7:  60   Time 12    Period Weeks    Status On-going    Target Date 11/08/21      OT LONG TERM GOAL #7   Title Pt will demonstrate ability to pick up small objects and complete 9 hole peg test in less than 2 mins.    Baseline unable to perform at eval, 10th visit: still unable to pick up small pegs, 20th: unable to pick up pegs but improving; 08/17/21: not attempted d/t time constraints, will assess next visit; 09/04/21: 9 hole peg test in 5 min 53 sec;  7/17:  Pt  unable to complete this date but was able to place 6 of 9 pegs in 4 mins 20 secs   Time 12    Period Weeks    Status On-going    Target Date 11/08/21              Plan -     Clinical Impression Statement Pt has continued to make excellent progress in the last cert period despite recent fall with injury to right LE.  Her grip has increased in the left hand from 1# to 5#, her manipulation skills remain slow but her speed and dexterity have improved in recent weeks.  She demonstrated some stiffness in left hand this date, complicated by Raynaud's syndrome and had difficulty with completion of 9 hole peg test. FOTO score has continued to improve and now score of 60 which exceeded her projected score.  She has progressed with basic self care tasks but continues to require assist with her bra and shower transfers (due to recent fall).  She has continued to benefit from skilled OT services to maximize safety and independence in necessary daily tasks.     OT Occupational Profile and History Detailed Assessment- Review of Records and additional review of physical, cognitive, psychosocial history related to current functional performance    Occupational performance deficits (Please refer to evaluation for details): ADL's;IADL's;Leisure;Rest and Sleep    Body Structure / Function / Physical Skills ADL;Coordination;Endurance;GMC;UE functional use;Balance;IADL;Pain;Dexterity;FMC;Strength;Edema;Mobility;ROM    Psychosocial Skills Environmental  Adaptations;Habits;Routines and Behaviors    Rehab Potential Good    Clinical Decision Making Several treatment options, min-mod task modification necessary  Comorbidities Affecting Occupational Performance: May have comorbidities impacting occupational performance    Modification or Assistance to Complete Evaluation  Min-Moderate modification of tasks or assist with assess necessary to complete eval    OT Frequency 2x / week    OT Duration 12 weeks    OT  Treatment/Interventions Self-care/ADL training;Cryotherapy;Paraffin;Therapeutic exercise;DME and/or AE instruction;Functional Mobility Training;Balance training;Electrical Stimulation;Ultrasound;Neuromuscular education;Manual Therapy;Splinting;Moist Heat;Contrast Bath;Passive range of motion;Therapeutic activities;Patient/family education;Coping strategies training    Plan OT recert    Consulted and Agree with Plan of Care Patient             Charliene Inoue Colette Ribas, OT 10/10/2021, 11:39 AM

## 2021-10-11 ENCOUNTER — Ambulatory Visit: Payer: Medicare PPO

## 2021-10-11 NOTE — Therapy (Signed)
OUTPATIENT OCCUPATIONAL THERAPY TREATMENT NOTE  Patient Name: Teresa [name] MRN: [id number] DOB:12/05/1940, 81 y.o., female Today's Date: 10/08/2021  PCP: Dr. [name] REFERRING PROVIDER: Dr. [name]   OT End of Session - 10/11/21 0824     Visit Number 41    Number of Visits 48    Date for OT Re-Evaluation 11/08/21    OT Start Time 1120    OT Stop Time 1324    OT Time Calculation (min) 45 min    Activity Tolerance Patient tolerated treatment well    Behavior During Therapy [facility] for tasks assessed/performed             Past Medical History:  Diagnosis Date   Cancer ([place])    skin   Hypothyroidism    Raynaud's disease    Past Surgical History:  Procedure Laterality Date   [place]   BACK SURGERY  2010   Cervical fusion C4-5-6-7   CATARACT EXTRACTION, BILATERAL Bilateral 10/2016   CHOLECYSTECTOMY  1995   COLONOSCOPY WITH PROPOFOL N/A 12/08/2018   Procedure: COLONOSCOPY WITH PROPOFOL;  Surgeon: Toledo, Benay Pike, MD;  Location: [facility] ENDOSCOPY;  Service: Gastroenterology;  Laterality: N/A;   EYE SURGERY     Patient Active Problem List   Diagnosis Date Noted   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  Following inpt rehab, pt went home and had home health.   PRECAUTIONS: fall  SUBJECTIVE:   Pt reports hand has loosened up today as compared to last session.  PAIN:  Are you having  pain? Yes: NPRS scale: 3/10 Pain location: right leg  Pain description: sore Aggravating factors: N/A Relieving factors: rest, heat, otc pain meds     OBJECTIVE:  TODAY'S TREATMENT:  Moist heat applied to R shoulder initially, but alternated throughout between R/L throughout session with simultaneous completion of therapeutic exercise, working to reduce pain and muscle stiffness bilat. Therapeutic Exercise: Performed bilat shoulder elevation/depression and retraction/protraction x10 each.  Completed passive L shoulder flex/abd/horiz abd/add, ER, and L wrist flex/ext, digit flex/ext all digits.  Completed 5 sets 10 reps of hand gripper with 1 red band for resistance, rest and passive digit and wrist extension stretch between sets.    Neuro re-ed: Pt worked with thin wooden dowels (approximate size of her crochet needles) using L hand to remove dowels from container and placing vertically on wooden base, having to line up dowel into hole, then removed to place back into container.  Practiced holding dowel in L hand while using R hand to thread washers onto dowel, intermittent cues for adjusting grip and pinch to limit dropping.  After fairly good manipulation of these dowels, OT encouraged pt practice with her crochet needles at home this week, as this was pt's preferred leisure activity prior to her CVA.  Pt receptive and pleased with her ability to manipulate dowels.     PATIENT EDUCATION: Education details: grasp patterns  Person educated: pt Education method: Explanation and Verbal cues Education comprehension: verbalized understanding, demonstration   HOME EXERCISE PROGRAM Continue to engage LUE into ADLs; continue gripping and pinching exercises for LUE, and L shoulder AROM/AAROM      OT Long Term Goals -       OT LONG TERM GOAL #1   Title Pt will be independent with home exercise program.    Baseline Eval: no current program, 10th visit:  continue to add new exercises as pt  progresses, 20th:  continue to update HEP; 08/17/21: continue to progress HEP when indicated.  Visit 40: adding new exercises as pt progresses   Time 12    Period Weeks    Status On-going    Target Date 11/08/21      OT LONG TERM GOAL #2   Title Pt will complete UB and LB dressing with modified independence including buttons, snaps and zippers.    Baseline requires min assist at eval, 10th visit: occasional assist with buttons, 20th:  able to perform one handed, but difficulty with bilateral UE; 08/17/21: pt reports inconsistent with 1 hand, reviewed techniques this visit, Visit 40:  Pt requires assist with bra   Time 12    Period Weeks    Status On-going    Target Date 11/08/21      OT LONG TERM GOAL #3   Title Pt will perform shower transfer with modified independence.    Baseline Pt requires supervision to min assist for shower transfer at home. 10th visit: supervision; 08/17/21: supv .  Visit 40:  Pt had met goal but had recent fall and now requiring min guard to supv   Time 6    Period Weeks    Status Partially Met    Target Date 09/27/21      OT LONG TERM GOAL #4   Title Pt will improve L hand grip by 10# to assist with holding items in left hand securely.    Baseline no grip in left hand at eval, 10th visit:  improved flexion but still working towards composite fisting and grip. 20th:  continues to demo decreased grip; 08/17/21: active digit flexion improving, but not yet able to register grip on dynamometer; 09/04/21: L grip 1# 7/17:  5#    Time 12    Period Weeks    Status On-going    Target Date 11/08/21      OT LONG TERM GOAL #5   Title Pt will improve left shoulder flexion to 100 degrees or better to improve reaching to obtain self care items from shelf/shoulder height.    Baseline difficulty with reach, shoulder flexion to 47 degrees; 08/17/21: L shoulder flexion 85, but not yet able to consistently hold ADL supplies in L hand when reaching; 09/04/21: flexion 85 7/17:  shoulder  flexion to 90   Time 12    Period Weeks    Status On-going    Target Date 11/08/21      OT LONG TERM GOAL #6   Title Pt will improve FOTO score to 47 or above to demonstrate a clinically relevant change in function to impact ADL tasks.    Baseline score of 30 at eval; 08/17/21: FOTO: 42; 09/04/21: FOTO : 54 , FOTO 7/7:  60   Time 12    Period Weeks    Status On-going    Target Date 11/08/21      OT LONG TERM GOAL #7   Title Pt will demonstrate ability to pick up  small objects and complete 9 hole peg test in less than 2 mins.    Baseline unable to perform at eval, 10th visit: still unable to pick up small pegs, 20th: unable to pick up pegs but improving; 08/17/21: not attempted d/t time constraints, will assess next visit; 09/04/21: 9 hole peg test in 5 min 53 sec;  7/17:  Pt unable to complete this date but was able to place 6 of 9 pegs in 4 mins 20 secs   Time 12    Period Weeks    Status On-going    Target Date 11/08/21              Plan -     Clinical Impression Statement Pt reports she was able to fasten her bra and completely dress herself this morning.  Pt fastens bra in the front at baseline and turns it around.  L hand dexterity continues to improve.  Noting more active movement from the PIPs in L hand, and active and passive composite fist significantly improved from last week.  OT adjusted cane 1 notch lower and made recommendation to utilize walker at home for a few days until shoulder pain improves, working to spread out the load of weight throughout [place].  Encouraged use of heating pads to bilat shoulders at home as needed for pain.  Pt did well manipulating thin dowels in L hand as goal was to simulate use of crochet needles.  Pt had a few dropped dowels during manipulation activities but overall anticipate pt will be able to start working on her crochet for short periods at home, and OT encouraged pt make attempts this week.  Pt receptive to all education and recommendations  this date.  Pt will continue to benefit from skilled OT to work towards goals in plan of care to improve LUE functional use for ADL and IADL tasks.   OT Occupational Profile and History Detailed Assessment- Review of Records and additional review of physical, cognitive, psychosocial history related to current functional performance    Occupational performance deficits (Please refer to evaluation for details): ADL's;IADL's;Leisure;Rest and Sleep    Body Structure / Function / Physical Skills ADL;Coordination;Endurance;GMC;UE functional use;Balance;IADL;Pain;Dexterity;FMC;Strength;Edema;Mobility;ROM    Psychosocial Skills Environmental  Adaptations;Habits;Routines and Behaviors    Rehab Potential Good    Clinical Decision Making Several treatment options, min-mod task modification necessary    Comorbidities Affecting Occupational Performance: May have comorbidities impacting occupational performance    Modification or Assistance to Complete Evaluation  Min-Moderate modification of tasks or assist with assess necessary to complete eval    OT Frequency 2x / week    OT Duration 12 weeks    OT Treatment/Interventions Self-care/ADL training;Cryotherapy;Paraffin;Therapeutic exercise;DME and/or AE instruction;Functional Mobility Training;Balance training;Electrical Stimulation;Ultrasound;Neuromuscular education;Manual Therapy;Splinting;Moist Heat;Contrast Bath;Passive range of motion;Therapeutic activities;Patient/family education;Coping strategies training    Plan OT recert    Consulted and Agree with Plan of Care Patient             Leta Speller, MS, OTR/L   Darleene Cleaver, OT 10/11/2021, 8:27 AM

## 2021-10-15 ENCOUNTER — Ambulatory Visit: Payer: Medicare PPO

## 2021-10-16 ENCOUNTER — Ambulatory Visit: Payer: Medicare PPO

## 2021-10-17 ENCOUNTER — Ambulatory Visit: Payer: Medicare PPO | Admitting: Physical Therapy

## 2021-10-17 ENCOUNTER — Ambulatory Visit: Payer: Medicare PPO

## 2021-10-17 DIAGNOSIS — I63511 Cerebral infarction due to unspecified occlusion or stenosis of right middle cerebral artery: Secondary | ICD-10-CM

## 2021-10-17 DIAGNOSIS — M6281 Muscle weakness (generalized): Secondary | ICD-10-CM

## 2021-10-17 DIAGNOSIS — R278 Other lack of coordination: Secondary | ICD-10-CM

## 2021-10-17 NOTE — Therapy (Signed)
OUTPATIENT OCCUPATIONAL THERAPY TREATMENT NOTE  Patient Name: Teresa [name] MRN: [id number] DOB:Dec 07, 1940, 81 y.o., female Today's Date: 10/08/2021  PCP: Dr. [name] REFERRING PROVIDER: Dr. [name]   OT End of Session - 10/17/21 1543     Visit Number 42    Number of Visits 48    Date for OT Re-Evaluation 11/08/21    OT Start Time 0930    OT Stop Time 1015    OT Time Calculation (min) 45 min    Activity Tolerance Patient tolerated treatment well    Behavior During Therapy [facility] for tasks assessed/performed             Past Medical History:  Diagnosis Date   Cancer ([place])    skin   Hypothyroidism    Raynaud's disease    Past Surgical History:  Procedure Laterality Date   [place]   BACK SURGERY  2010   Cervical fusion C4-5-6-7   CATARACT EXTRACTION, BILATERAL Bilateral 10/2016   CHOLECYSTECTOMY  1995   COLONOSCOPY WITH PROPOFOL N/A 12/08/2018   Procedure: COLONOSCOPY WITH PROPOFOL;  Surgeon: Toledo, Benay Pike, MD;  Location: [facility] ENDOSCOPY;  Service: Gastroenterology;  Laterality: N/A;   EYE SURGERY     Patient Active Problem List   Diagnosis Date Noted   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 in [place] where she was admitted and after acute care she went to inpatient rehab.  Following inpt rehab, pt went home and had home health.   PRECAUTIONS: fall  SUBJECTIVE:   Pt brought in her crochet needle and yarn to show OT her work over the weekend.  Pt was  able to start a small area, stating that it took her 3 hours and would normally take her 10 min to do this particular section, but she was proud of what she had done.  PAIN:  Are you having pain? Yes: NPRS scale: 3/10 Pain location: right leg, R shoulder  Pain description: sore Aggravating factors: N/A Relieving factors: rest, heat, otc pain meds   OBJECTIVE:  TODAY'S TREATMENT:  Moist heat applied to R shoulder throughout session with simultaneous completion of therapeutic exercise for the LUE, working to reduce pain and muscle stiffness bilat.  Therapeutic Exercise: Completed passive L shoulder flex/abd/horiz abd/add, ER, forearm pron/sup, L wrist flex/ext, and digit flex/ext all digits in prep for functional activities/exercises.  Completed 5 sets 10 reps of hand gripper with 1 red band for resistance, rest and passive digit and wrist extension stretch between sets.    Neuro re-ed: Pt worked with [employer] with focus on gripping while performing wrist flex/ext and forearm pron/sup, as well as lateral pinch to simulate turning a key.  Pt required intermittent assist to level manipulatives and intermittent min A to work through Furniture conservator/restorer.   PATIENT EDUCATION: Education details: combination movements with manipulation of [place] board tools/manipulatives  Person educated: pt Education method: Explanation and Verbal cues Education comprehension: verbalized understanding, demonstration   HOME EXERCISE PROGRAM Continue to engage  LUE into ADLs; continue gripping and pinching exercises for LUE, and L shoulder AROM/AAROM     OT Long Term Goals -       OT LONG TERM GOAL #1   Title Pt will be independent with home exercise program.    Baseline Eval: no current program, 10th visit:  continue to add new exercises as pt progresses, 20th:  continue to update HEP; 08/17/21: continue to progress HEP when indicated.  Visit 40: adding new exercises as pt progresses   Time 12     Period Weeks    Status On-going    Target Date 11/08/21      OT LONG TERM GOAL #2   Title Pt will complete UB and LB dressing with modified independence including buttons, snaps and zippers.    Baseline requires min assist at eval, 10th visit: occasional assist with buttons, 20th:  able to perform one handed, but difficulty with bilateral UE; 08/17/21: pt reports inconsistent with 1 hand, reviewed techniques this visit, Visit 40:  Pt requires assist with bra   Time 12    Period Weeks    Status On-going    Target Date 11/08/21      OT LONG TERM GOAL #3   Title Pt will perform shower transfer with modified independence.    Baseline Pt requires supervision to min assist for shower transfer at home. 10th visit: supervision; 08/17/21: supv .  Visit 40:  Pt had met goal but had recent fall and now requiring min guard to supv   Time 6    Period Weeks    Status Partially Met    Target Date 09/27/21      OT LONG TERM GOAL #4   Title Pt will improve L hand grip by 10# to assist with holding items in left hand securely.    Baseline no grip in left hand at eval, 10th visit:  improved flexion but still working towards composite fisting and grip. 20th:  continues to demo decreased grip; 08/17/21: active digit flexion improving, but not yet able to register grip on dynamometer; 09/04/21: L grip 1# 7/17:  5#    Time 12    Period Weeks    Status On-going    Target Date 11/08/21      OT LONG TERM GOAL #5   Title Pt will improve left shoulder flexion to 100 degrees or better to improve reaching to obtain self care items from shelf/shoulder height.    Baseline difficulty with reach, shoulder flexion to 47 degrees; 08/17/21: L shoulder flexion 85, but not yet able to consistently hold ADL supplies in L hand when reaching; 09/04/21: flexion 85 7/17:  shoulder flexion to 90   Time 12    Period Weeks    Status On-going    Target Date 11/08/21      OT LONG TERM GOAL #6   Title Pt will improve FOTO score to 47  or above to demonstrate a clinically relevant change in function to impact ADL tasks.    Baseline score of 30 at eval; 08/17/21: FOTO: 42; 09/04/21: FOTO : 54 , FOTO 7/7:  60   Time 12    Period Weeks    Status On-going    Target Date 11/08/21      OT LONG TERM GOAL #7   Title Pt will demonstrate ability to pick up small objects and complete 9 hole peg test in less than 2 mins.    Baseline unable to perform at eval, 10th  visit: still unable to pick up small pegs, 20th: unable to pick up pegs but improving; 08/17/21: not attempted d/t time constraints, will assess next visit; 09/04/21: 9 hole peg test in 5 min 53 sec;  7/17:  Pt unable to complete this date but was able to place 6 of 9 pegs in 4 mins 20 secs   Time 12    Period Weeks    Status On-going    Target Date 11/08/21              Plan -     Clinical Impression Statement Pt brought in her crochet needle and yarn to show OT her work over the weekend.  Pt was able to start a small area, stating that it took her 3 hours and would normally take her 10 min to do this particular section, but she was proud of what she had done.  OT encouraged continued practice with short sessions to minimize frustration.  Pt continues to show improved tolerance for hand gripper and is presenting with increased digit flexibility and dexterity.  Pt states improved R shoulder pain since cane adjustment last session.  Pt will continue to benefit from skilled OT to work towards goals in plan of care to improve LUE functional use for ADL and IADL tasks.   OT Occupational Profile and History Detailed Assessment- Review of Records and additional review of physical, cognitive, psychosocial history related to current functional performance    Occupational performance deficits (Please refer to evaluation for details): ADL's;IADL's;Leisure;Rest and Sleep    Body Structure / Function / Physical Skills ADL;Coordination;Endurance;GMC;UE functional  use;Balance;IADL;Pain;Dexterity;FMC;Strength;Edema;Mobility;ROM    Psychosocial Skills Environmental  Adaptations;Habits;Routines and Behaviors    Rehab Potential Good    Clinical Decision Making Several treatment options, min-mod task modification necessary    Comorbidities Affecting Occupational Performance: May have comorbidities impacting occupational performance    Modification or Assistance to Complete Evaluation  Min-Moderate modification of tasks or assist with assess necessary to complete eval    OT Frequency 2x / week    OT Duration 12 weeks    OT Treatment/Interventions Self-care/ADL training;Cryotherapy;Paraffin;Therapeutic exercise;DME and/or AE instruction;Functional Mobility Training;Balance training;Electrical Stimulation;Ultrasound;Neuromuscular education;Manual Therapy;Splinting;Moist Heat;Contrast Bath;Passive range of motion;Therapeutic activities;Patient/family education;Coping strategies training    Plan OT recert    Consulted and Agree with Plan of Care Patient             Leta Speller, MS, OTR/L   Darleene Cleaver, OT 10/17/2021, 3:44 PM

## 2021-10-17 NOTE — Progress Notes (Signed)
Carelink Summary Report / Loop Recorder 

## 2021-10-22 ENCOUNTER — Ambulatory Visit: Payer: Medicare PPO

## 2021-10-22 DIAGNOSIS — M6281 Muscle weakness (generalized): Secondary | ICD-10-CM | POA: Diagnosis not present

## 2021-10-22 DIAGNOSIS — I63511 Cerebral infarction due to unspecified occlusion or stenosis of right middle cerebral artery: Secondary | ICD-10-CM

## 2021-10-22 DIAGNOSIS — R278 Other lack of coordination: Secondary | ICD-10-CM

## 2021-10-22 NOTE — Therapy (Signed)
OUTPATIENT OCCUPATIONAL THERAPY TREATMENT NOTE  Patient Name: Teresa [name] MRN: [id number] DOB:1940-10-11, 81 y.o., female Today's Date: 10/08/2021  PCP: Dr. [name] REFERRING PROVIDER: Dr. [name]   OT End of Session - 10/22/21 1717     Visit Number 43    Number of Visits 48    Date for OT Re-Evaluation 11/08/21    OT Start Time 1301    OT Stop Time 1346    OT Time Calculation (min) 45 min    Activity Tolerance Patient tolerated treatment well    Behavior During Therapy [facility] for tasks assessed/performed             Past Medical History:  Diagnosis Date   Cancer ([place])    skin   Hypothyroidism    Raynaud's disease    Past Surgical History:  Procedure Laterality Date   [place]   BACK SURGERY  2010   Cervical fusion C4-5-6-7   CATARACT EXTRACTION, BILATERAL Bilateral 10/2016   CHOLECYSTECTOMY  1995   COLONOSCOPY WITH PROPOFOL N/A 12/08/2018   Procedure: COLONOSCOPY WITH PROPOFOL;  Surgeon: Toledo, Benay Pike, MD;  Location: [facility] ENDOSCOPY;  Service: Gastroenterology;  Laterality: N/A;   EYE SURGERY     Patient Active Problem List   Diagnosis Date Noted   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  Following inpt rehab, pt went home and had home health.   PRECAUTIONS: fall  SUBJECTIVE:   Pt reports her R shoulder is still hurting, but more so when she gets up in the morning.   PAIN:  Are you having pain? Yes: NPRS scale: 3/10 Pain location:  R shoulder  Pain description: sore Aggravating factors: N/A Relieving factors: rest, heat, otc pain meds   OBJECTIVE:  TODAY'S TREATMENT:  Moist heat applied to R shoulder throughout session with simultaneous completion of therapeutic exercise for the LUE, working to reduce pain and muscle stiffness bilat.  Therapeutic Exercise: Completed passive L shoulder flex/abd/horiz abd/add, ER, forearm pron/sup, L wrist flex/ext, and digit flex/ext all digits in prep for functional activities/exercises.  Completed 6 sets 10 reps of hand gripper with 1 red band for resistance, rest and passive digit and wrist extension stretch between sets.  End of session performed passive PIP flexion at digits 2-4 to increase flexibility for making full composite fist d/t trouble with storing items in hand as noted below.  Neuro re-ed: Pt worked with [place] stones to facilitate scooping objects from dish, storing in palm, then discarding from palm.  Practiced 2 and 3 point pinch to gather stones from dish and from table top to place in wooden game board.  OT provided verbal and visual cueing to extend L arm when reaching rather than leaning forward from the torso.  Pt was able to correct with these cues.    PATIENT EDUCATION: Education details: normal movement patterns when reaching with LUE, working to reduce compensatory leaning from the trunk.  Person educated: pt Education method: Explanation and Verbal cues Education comprehension: verbalized understanding, demonstration   HOME EXERCISE PROGRAM Continue to engage LUE into ADLs; continue gripping and pinching exercises for LUE, and L shoulder AROM/AAROM, crochet      OT Long Term Goals -       OT LONG TERM GOAL #1   Title Pt will be independent with home exercise program.    Baseline Eval: no current program, 10th visit:  continue to add new exercises as pt progresses, 20th:  continue to  update HEP; 08/17/21: continue to progress HEP when indicated.  Visit 40: adding new exercises as pt progresses   Time 12    Period Weeks    Status On-going    Target Date 11/08/21      OT LONG TERM GOAL #2   Title Pt will complete UB and LB dressing with modified independence including buttons, snaps and zippers.    Baseline requires min assist at eval, 10th visit: occasional assist with buttons, 20th:  able to perform one handed, but difficulty with bilateral UE; 08/17/21: pt reports inconsistent with 1 hand, reviewed techniques this visit, Visit 40:  Pt requires assist with bra   Time 12    Period Weeks    Status On-going    Target Date 11/08/21      OT LONG TERM GOAL #3   Title Pt will perform shower transfer with modified independence.    Baseline Pt requires supervision to min assist for shower transfer at home. 10th visit: supervision; 08/17/21: supv .  Visit 40:  Pt had met goal but had recent fall and now requiring min guard to supv   Time 6    Period Weeks    Status Partially Met    Target Date 09/27/21      OT LONG TERM GOAL #4   Title Pt will improve L hand grip by 10# to assist with holding items in left hand securely.    Baseline no grip in left hand at eval, 10th visit:  improved flexion but still working towards composite fisting and grip. 20th:  continues to demo decreased grip; 08/17/21: active digit flexion improving, but not yet able to register grip on dynamometer; 09/04/21: L grip 1# 7/17:  5#    Time 12    Period Weeks    Status On-going    Target Date 11/08/21      OT LONG TERM GOAL #5   Title Pt will improve left shoulder flexion to 100 degrees or better to improve reaching to obtain self care items from shelf/shoulder height.    Baseline difficulty with reach, shoulder flexion to 47 degrees; 08/17/21: L shoulder flexion 85, but not yet able to consistently hold ADL supplies in L hand when reaching; 09/04/21: flexion 85 7/17:  shoulder flexion to 90   Time 12     Period Weeks    Status On-going    Target Date 11/08/21      OT LONG TERM GOAL #6   Title Pt will improve FOTO score to 47 or above to demonstrate a clinically relevant change in function to impact ADL tasks.    Baseline score of 30 at eval; 08/17/21: FOTO: 42; 09/04/21: FOTO : 54 , FOTO 7/7:  60   Time 12    Period Weeks    Status On-going    Target Date 11/08/21      OT LONG TERM GOAL #7   Title Pt will demonstrate ability to pick  up small objects and complete 9 hole peg test in less than 2 mins.    Baseline unable to perform at eval, 10th visit: still unable to pick up small pegs, 20th: unable to pick up pegs but improving; 08/17/21: not attempted d/t time constraints, will assess next visit; 09/04/21: 9 hole peg test in 5 min 53 sec;  7/17:  Pt unable to complete this date but was able to place 6 of 9 pegs in 4 mins 20 secs   Time 12    Period Weeks    Status On-going    Target Date 11/08/21              Plan -     Clinical Impression Statement Pt continues to show improved tolerance for hand gripper and is presenting with increased digit flexibility and dexterity.  End of session performed passive PIP flexion at digits 2-4 to increase flexibility for making full composite fist d/t trouble with storing items in hand (multiple dropped stones when attempting to store in palm).  Pt will continue to benefit from skilled OT to work towards goals in plan of care to improve LUE functional use for ADL and IADL tasks.   OT Occupational Profile and History Detailed Assessment- Review of Records and additional review of physical, cognitive, psychosocial history related to current functional performance    Occupational performance deficits (Please refer to evaluation for details): ADL's;IADL's;Leisure;Rest and Sleep    Body Structure / Function / Physical Skills ADL;Coordination;Endurance;GMC;UE functional use;Balance;IADL;Pain;Dexterity;FMC;Strength;Edema;Mobility;ROM    Psychosocial Skills  Environmental  Adaptations;Habits;Routines and Behaviors    Rehab Potential Good    Clinical Decision Making Several treatment options, min-mod task modification necessary    Comorbidities Affecting Occupational Performance: May have comorbidities impacting occupational performance    Modification or Assistance to Complete Evaluation  Min-Moderate modification of tasks or assist with assess necessary to complete eval    OT Frequency 2x / week    OT Duration 12 weeks    OT Treatment/Interventions Self-care/ADL training;Cryotherapy;Paraffin;Therapeutic exercise;DME and/or AE instruction;Functional Mobility Training;Balance training;Electrical Stimulation;Ultrasound;Neuromuscular education;Manual Therapy;Splinting;Moist Heat;Contrast Bath;Passive range of motion;Therapeutic activities;Patient/family education;Coping strategies training    Plan OT recert    Consulted and Agree with Plan of Care Patient             Leta Speller, MS, OTR/L   Darleene Cleaver, OT 10/22/2021, 5:19 PM

## 2021-10-24 ENCOUNTER — Ambulatory Visit: Payer: Medicare PPO | Admitting: Physical Therapy

## 2021-10-24 ENCOUNTER — Ambulatory Visit: Payer: Medicare PPO | Attending: Internal Medicine

## 2021-10-24 DIAGNOSIS — R2681 Unsteadiness on feet: Secondary | ICD-10-CM | POA: Insufficient documentation

## 2021-10-24 DIAGNOSIS — M6281 Muscle weakness (generalized): Secondary | ICD-10-CM | POA: Insufficient documentation

## 2021-10-24 DIAGNOSIS — I63511 Cerebral infarction due to unspecified occlusion or stenosis of right middle cerebral artery: Secondary | ICD-10-CM | POA: Insufficient documentation

## 2021-10-24 DIAGNOSIS — R262 Difficulty in walking, not elsewhere classified: Secondary | ICD-10-CM | POA: Insufficient documentation

## 2021-10-24 DIAGNOSIS — G8929 Other chronic pain: Secondary | ICD-10-CM | POA: Insufficient documentation

## 2021-10-24 DIAGNOSIS — M25512 Pain in left shoulder: Secondary | ICD-10-CM | POA: Diagnosis present

## 2021-10-24 DIAGNOSIS — R278 Other lack of coordination: Secondary | ICD-10-CM | POA: Insufficient documentation

## 2021-10-25 NOTE — Therapy (Signed)
OUTPATIENT OCCUPATIONAL THERAPY TREATMENT NOTE  Patient Name: Teresa [name] MRN: [id number] DOB:10/02/1940, 81 y.o., female Today's Date: 10/08/2021  PCP: Dr. [name] REFERRING PROVIDER: Dr. [name]   OT End of Session - 10/25/21 0805     Visit Number 44    Number of Visits 48    Date for OT Re-Evaluation 11/08/21    OT Start Time 0240    OT Stop Time 1230    OT Time Calculation (min) 45 min    Activity Tolerance Patient tolerated treatment well    Behavior During Therapy [facility] for tasks assessed/performed             Past Medical History:  Diagnosis Date   Cancer ([place])    skin   Hypothyroidism    Raynaud's disease    Past Surgical History:  Procedure Laterality Date   [place]   BACK SURGERY  2010   Cervical fusion C4-5-6-7   CATARACT EXTRACTION, BILATERAL Bilateral 10/2016   CHOLECYSTECTOMY  1995   COLONOSCOPY WITH PROPOFOL N/A 12/08/2018   Procedure: COLONOSCOPY WITH PROPOFOL;  Surgeon: Toledo, Benay Pike, MD;  Location: [facility] ENDOSCOPY;  Service: Gastroenterology;  Laterality: N/A;   EYE SURGERY     Patient Active Problem List   Diagnosis Date Noted   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   Stroke ([place]) 02/27/2021   History of nonmelanoma skin cancer 05/23/2014    ONSET DATE: 02/26/2021  REFERRING DIAG: R MCA CVA  THERAPY DIAG:  Muscle weakness (generalized)  Other lack of coordination  Right middle cerebral artery stroke [facility])  Rationale for Evaluation and Treatment Rehabilitation  PERTINENT HISTORY: February 26, 2021, pt reports she had a CVA, came to [facility] to ER and then was transferred to [facility] in [place] where she was admitted and after acute care she went to inpatient rehab.  Following inpt rehab, pt went home and had home health.   PRECAUTIONS: fall  SUBJECTIVE:   Pt reports she had a follow up with the orthopedist and she was able to d/c her ankle brace  as her ankle has healed well.  Pt will restart PT next week.  PAIN:  Are you having pain? Yes: NPRS scale: 3/10 Pain location:  R shoulder  Pain description: sore Aggravating factors: N/A Relieving factors: rest, heat, otc pain meds   OBJECTIVE:  TODAY'S TREATMENT:  Moist heat applied to R shoulder throughout session with simultaneous completion of therapeutic exercise for the LUE, working to reduce pain and muscle stiffness.  Therapeutic Exercise: Completed passive L shoulder flex/abd/horiz abd/add, ER, forearm pron/sup, L wrist flex/ext, and digit flex/ext all digits in prep for functional activities/exercises.  Completed 5 sets 10 reps of hand gripper with 1 red band for resistance, rest and passive digit and wrist extension stretch between sets.  Completed 3 sets at beginning of session and 2 sets end of session.  Facilitated pinch strengthening with use of therapy resistant clothespins to target lateral pinch in L hand.  Able to pinch yellow and red clips with L hand.  Facilitated reaching forward and laterally R/L for clips with varied positioning of clips and vertical dowel on tabletop.  Pt was cued to extend elbow for greater reach and limit movement at the torso when reaching for clips; able to correct with vc and occasional tactile cues.   Neuro re-ed: Targeted 3 point pinch on the L hand working with ball pegs and pegboard.  Pt  was able to remove ball pegs from board with the L hand without much difficulty; transitioned to pt picking up pegs from table top and placing in board.  Pt required frequent rest breaks and multiple trials for successful pick up d/t fatigue by end of session.   PATIENT EDUCATION: Education details: normal movement patterns when reaching with LUE, working to reduce compensatory leaning from the trunk. Person educated: pt Education method: Explanation and Verbal cues Education comprehension: verbalized understanding, demonstration   HOME EXERCISE  PROGRAM Continue to engage LUE into ADLs; continue gripping and pinching exercises for LUE, and L shoulder AROM/AAROM, crochet      OT Long Term Goals -       OT LONG TERM GOAL #1   Title Pt will be independent with home exercise program.    Baseline Eval: no current program, 10th visit:  continue to add new exercises as pt progresses, 20th:  continue to update HEP; 08/17/21: continue to progress HEP when indicated.  Visit 40: adding new exercises as pt progresses   Time 12    Period Weeks    Status On-going    Target Date 11/08/21      OT LONG TERM GOAL #2   Title Pt will complete UB and LB dressing with modified independence including buttons, snaps and zippers.    Baseline requires min assist at eval, 10th visit: occasional assist with buttons, 20th:  able to perform one handed, but difficulty with bilateral UE; 08/17/21: pt reports inconsistent with 1 hand, reviewed techniques this visit, Visit 40:  Pt requires assist with bra   Time 12    Period Weeks    Status On-going    Target Date 11/08/21      OT LONG TERM GOAL #3   Title Pt will perform shower transfer with modified independence.    Baseline Pt requires supervision to min assist for shower transfer at home. 10th visit: supervision; 08/17/21: supv .  Visit 40:  Pt had met goal but had recent fall and now requiring min guard to supv   Time 6    Period Weeks    Status Partially Met    Target Date 09/27/21      OT LONG TERM GOAL #4   Title Pt will improve L hand grip by 10# to assist with holding items in left hand securely.    Baseline no grip in left hand at eval, 10th visit:  improved flexion but still working towards composite fisting and grip. 20th:  continues to demo decreased grip; 08/17/21: active digit flexion improving, but not yet able to register grip on dynamometer; 09/04/21: L grip 1# 7/17:  5#    Time 12    Period Weeks    Status On-going    Target Date 11/08/21      OT LONG TERM GOAL #5   Title Pt will  improve left shoulder flexion to 100 degrees or better to improve reaching to obtain self care items from shelf/shoulder height.    Baseline difficulty with reach, shoulder flexion to 47 degrees; 08/17/21: L shoulder flexion 85, but not yet able to consistently hold ADL supplies in L hand when reaching; 09/04/21: flexion 85 7/17:  shoulder flexion to 90   Time 12    Period Weeks    Status On-going    Target Date 11/08/21      OT LONG TERM GOAL #6   Title Pt will improve FOTO score to 47 or above to demonstrate a clinically  relevant change in function to impact ADL tasks.    Baseline score of 30 at eval; 08/17/21: FOTO: 42; 09/04/21: FOTO : 54 , FOTO 7/7:  60   Time 12    Period Weeks    Status On-going    Target Date 11/08/21      OT LONG TERM GOAL #7   Title Pt will demonstrate ability to pick up small objects and complete 9 hole peg test in less than 2 mins.    Baseline unable to perform at eval, 10th visit: still unable to pick up small pegs, 20th: unable to pick up pegs but improving; 08/17/21: not attempted d/t time constraints, will assess next visit; 09/04/21: 9 hole peg test in 5 min 53 sec;  7/17:  Pt unable to complete this date but was able to place 6 of 9 pegs in 4 mins 20 secs   Time 12    Period Weeks    Status On-going    Target Date 11/08/21              Plan -     Clinical Impression Statement Pt continues to make steady improvements with L hand digit flexibility and dexterity.  Continued pain reported in R shoulder, but pt responds well to moist heat.  Pt tolerated all passive stretching throughout the LUE, and reports that she feels stretching prior to other therapeutic exercises has helped to improve performance during activities.  Though improving, pt continues to present with stiffness and weakness in the L shoulder, forearm, wrist, and hand.  Pt will continue to benefit from skilled OT to work towards goals in plan of care to improve LUE functional use for ADL and IADL  tasks.   OT Occupational Profile and History Detailed Assessment- Review of Records and additional review of physical, cognitive, psychosocial history related to current functional performance    Occupational performance deficits (Please refer to evaluation for details): ADL's;IADL's;Leisure;Rest and Sleep    Body Structure / Function / Physical Skills ADL;Coordination;Endurance;GMC;UE functional use;Balance;IADL;Pain;Dexterity;FMC;Strength;Edema;Mobility;ROM    Psychosocial Skills Environmental  Adaptations;Habits;Routines and Behaviors    Rehab Potential Good    Clinical Decision Making Several treatment options, min-mod task modification necessary    Comorbidities Affecting Occupational Performance: May have comorbidities impacting occupational performance    Modification or Assistance to Complete Evaluation  Min-Moderate modification of tasks or assist with assess necessary to complete eval    OT Frequency 2x / week    OT Duration 12 weeks    OT Treatment/Interventions Self-care/ADL training;Cryotherapy;Paraffin;Therapeutic exercise;DME and/or AE instruction;Functional Mobility Training;Balance training;Electrical Stimulation;Ultrasound;Neuromuscular education;Manual Therapy;Splinting;Moist Heat;Contrast Bath;Passive range of motion;Therapeutic activities;Patient/family education;Coping strategies training    Plan OT recert    Consulted and Agree with Plan of Care Patient             Leta Speller, MS, OTR/L   Darleene Cleaver, OT 10/25/2021, 8:07 AM

## 2021-10-28 ENCOUNTER — Other Ambulatory Visit: Payer: Self-pay | Admitting: Dermatology

## 2021-10-29 ENCOUNTER — Ambulatory Visit (INDEPENDENT_AMBULATORY_CARE_PROVIDER_SITE_OTHER): Payer: Medicare PPO

## 2021-10-29 DIAGNOSIS — I639 Cerebral infarction, unspecified: Secondary | ICD-10-CM

## 2021-10-29 LAB — CUP PACEART REMOTE DEVICE CHECK
Date Time Interrogation Session: 20230804224935
Implantable Pulse Generator Implant Date: 19440924

## 2021-10-30 ENCOUNTER — Ambulatory Visit: Payer: Medicare PPO | Admitting: Occupational Therapy

## 2021-10-30 ENCOUNTER — Encounter: Payer: Self-pay | Admitting: Occupational Therapy

## 2021-10-30 ENCOUNTER — Ambulatory Visit: Payer: Medicare PPO

## 2021-10-30 DIAGNOSIS — I63511 Cerebral infarction due to unspecified occlusion or stenosis of right middle cerebral artery: Secondary | ICD-10-CM

## 2021-10-30 DIAGNOSIS — R278 Other lack of coordination: Secondary | ICD-10-CM

## 2021-10-30 DIAGNOSIS — M6281 Muscle weakness (generalized): Secondary | ICD-10-CM

## 2021-11-01 ENCOUNTER — Ambulatory Visit: Payer: Medicare PPO

## 2021-11-01 ENCOUNTER — Encounter: Payer: Self-pay | Admitting: Occupational Therapy

## 2021-11-01 ENCOUNTER — Ambulatory Visit: Payer: Medicare PPO | Admitting: Occupational Therapy

## 2021-11-01 DIAGNOSIS — R2681 Unsteadiness on feet: Secondary | ICD-10-CM

## 2021-11-01 DIAGNOSIS — R278 Other lack of coordination: Secondary | ICD-10-CM

## 2021-11-01 DIAGNOSIS — M6281 Muscle weakness (generalized): Secondary | ICD-10-CM

## 2021-11-01 DIAGNOSIS — I63511 Cerebral infarction due to unspecified occlusion or stenosis of right middle cerebral artery: Secondary | ICD-10-CM

## 2021-11-01 NOTE — Therapy (Addendum)
OUTPATIENT OCCUPATIONAL THERAPY TREATMENT NOTE  Patient Name: Teresa [name] MRN: [id number] DOB:March 30, 1940, 81 y.o., female Today's Date: 11/09/2021  PCP: Dr. [name] REFERRING PROVIDER: Dr. [name]   OT End of Session - 10/31/21 2103     Visit Number 45    Number of Visits 48    Date for OT Re-Evaluation 11/08/21    OT Start Time 3220    OT Stop Time 1100    OT Time Calculation (min) 45 min    Activity Tolerance Patient tolerated treatment well    Behavior During Therapy [facility] for tasks assessed/performed             Past Medical History:  Diagnosis Date   Cancer ([place])    skin   Hypothyroidism    Raynaud's disease    Past Surgical History:  Procedure Laterality Date   [place]   BACK SURGERY  2010   Cervical fusion C4-5-6-7   CATARACT EXTRACTION, BILATERAL Bilateral 10/2016   CHOLECYSTECTOMY  1995   COLONOSCOPY WITH PROPOFOL N/A 12/08/2018   Procedure: COLONOSCOPY WITH PROPOFOL;  Surgeon: Toledo, Benay Pike, MD;  Location: [facility] ENDOSCOPY;  Service: Gastroenterology;  Laterality: N/A;   EYE SURGERY     Patient Active Problem List   Diagnosis Date Noted   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  Following inpt rehab, pt went home and had home health.   PRECAUTIONS: fall  SUBJECTIVE:  Pt reports she is still waiting for her order to restart PT, hopefully this week.  Pt  reports she has pain in her uninvolved shoulder on the right from overuse.  Tightness, tenderness over upper trap PAIN:  Are you having pain? Yes: NPRS scale: 3/10 Pain location:  R shoulder  Pain description: sore Aggravating factors: N/A Relieving factors: rest, heat, otc pain meds   OBJECTIVE:  TODAY'S TREATMENT:  Moist heat applied to R shoulder throughout session with simultaneous completion of therapeutic exercise for the LUE, working to reduce pain and muscle stiffness.  Therapeutic Exercise: Completed passive L shoulder flex/abd/horiz abd/add, ER, forearm pron/sup, L wrist flex/ext, and digit flex/ext all digits in prep for functional activities/exercises.  Bilateral UE ball exercises with reach, pt with increased irritation and pain in right shoulder with task so discontinued bilateral task and switched to unilateral task with ball on tabletop with left arm on top of ball moving forwards and back to encourage reaching patterns, side to side.   Neuro re-ed: Pt brought in piece of crocheting, reports the task is difficult and frustrating but she can see progress with her left hand, using it for attempting to managing tension on yarn.  Manipulation of med to large washers, difficulty to obtain from magnetic bowl, washers placed on edge of tabletop and pt able to pick up and place onto magnetic hooks placed at shoulder height to encourage reach.  Pt demonstrates difficulty with manipulation skills to remove washers from hooks,  dropping items frequently.     PATIENT EDUCATION: Education details: normal movement patterns when reaching with LUE, working to reduce compensatory leaning from the trunk. Person educated: pt Education method: Explanation and Verbal cues Education comprehension: verbalized understanding, demonstration   HOME EXERCISE PROGRAM Continue to engage LUE into ADLs; continue gripping and pinching exercises for LUE, and L shoulder AROM/AAROM, crochet      OT Long  Term Goals -       OT LONG TERM GOAL #1   Title Pt will be independent with home exercise program.    Baseline Eval: no current program, 10th visit:  continue to add new exercises as pt progresses, 20th:  continue to update HEP; 08/17/21: continue to progress HEP when indicated.  Visit 40: adding new exercises as pt progresses   Time 12    Period Weeks    Status On-going    Target Date 11/08/21      OT LONG TERM GOAL #2   Title Pt will complete UB and LB dressing with modified independence including buttons, snaps and zippers.    Baseline requires min assist at eval, 10th visit: occasional assist with buttons, 20th:  able to perform one handed, but difficulty with bilateral UE; 08/17/21: pt reports inconsistent with 1 hand, reviewed techniques this visit, Visit 40:  Pt requires assist with bra   Time 12    Period Weeks    Status On-going    Target Date 11/08/21      OT LONG TERM GOAL #3   Title Pt will perform shower transfer with modified independence.    Baseline Pt requires supervision to min assist for shower transfer at home. 10th visit: supervision; 08/17/21: supv .  Visit 40:  Pt had met goal but had recent fall and now requiring min guard to supv   Time 6    Period Weeks    Status Partially Met    Target Date 09/27/21      OT LONG TERM GOAL #4   Title Pt will improve L hand grip by 10# to assist with holding items in left hand securely.    Baseline no grip in left hand at eval, 10th visit:  improved flexion but still working towards composite fisting and grip. 20th:  continues to demo decreased grip; 08/17/21: active digit flexion improving, but not yet able to register grip on dynamometer; 09/04/21: L grip 1# 7/17:  5#    Time 12    Period Weeks    Status On-going    Target Date 11/08/21      OT LONG TERM GOAL #5   Title Pt will improve left shoulder flexion to 100 degrees or better to improve reaching to obtain self care items from shelf/shoulder height.    Baseline  difficulty with reach, shoulder flexion to 47 degrees; 08/17/21: L shoulder flexion 85, but not yet able to consistently hold ADL supplies in L hand when reaching; 09/04/21: flexion 85 7/17:  shoulder flexion to 90   Time 12    Period Weeks    Status On-going    Target Date 11/08/21      OT LONG TERM GOAL #6   Title Pt will improve FOTO score to 47 or above to demonstrate a clinically relevant change in function to impact ADL tasks.    Baseline score of 30 at eval; 08/17/21: FOTO: 42; 09/04/21: FOTO : 54 , FOTO 7/7:  60   Time 12    Period Weeks    Status  On-going    Target Date 11/08/21      OT LONG TERM GOAL #7   Title Pt will demonstrate ability to pick up small objects and complete 9 hole peg test in less than 2 mins.    Baseline unable to perform at eval, 10th visit: still unable to pick up small pegs, 20th: unable to pick up pegs but improving; 08/17/21: not attempted d/t time constraints, will assess next visit; 09/04/21: 9 hole peg test in 5 min 53 sec;  7/17:  Pt unable to complete this date but was able to place 6 of 9 pegs in 4 mins 20 secs   Time 12    Period Weeks    Status On-going    Target Date 11/08/21              Plan -     Clinical Impression Statement Pt limited this date with pain in right UE likely due to overuse of right UE as she compensates for decreased ROM, strength and coordination of LUE.  Pt has continued to make progress with left UE over time, improved grasp/release patterns, manipulation of 1 inch objects and continues to work towards precision movement patterns.  She continues to drop smaller items frequently but responds well to cues for prehension patterns.  Pain also noted in LUE with overhead reach and ER movements.   Pt will continue to benefit from skilled OT to work towards goals in plan of care to improve LUE functional use for ADL and IADL tasks.   OT Occupational Profile and History Detailed Assessment- Review of Records and additional review of  physical, cognitive, psychosocial history related to current functional performance    Occupational performance deficits (Please refer to evaluation for details): ADL's;IADL's;Leisure;Rest and Sleep    Body Structure / Function / Physical Skills ADL;Coordination;Endurance;GMC;UE functional use;Balance;IADL;Pain;Dexterity;FMC;Strength;Edema;Mobility;ROM    Psychosocial Skills Environmental  Adaptations;Habits;Routines and Behaviors    Rehab Potential Good    Clinical Decision Making Several treatment options, min-mod task modification necessary    Comorbidities Affecting Occupational Performance: May have comorbidities impacting occupational performance    Modification or Assistance to Complete Evaluation  Min-Moderate modification of tasks or assist with assess necessary to complete eval    OT Frequency 2x / week    OT Duration 12 weeks    OT Treatment/Interventions Self-care/ADL training;Cryotherapy;Paraffin;Therapeutic exercise;DME and/or AE instruction;Functional Mobility Training;Balance training;Electrical Stimulation;Ultrasound;Neuromuscular education;Manual Therapy;Splinting;Moist Heat;Contrast Bath;Passive range of motion;Therapeutic activities;Patient/family education;Coping strategies training    Plan OT recert    Consulted and Agree with Plan of Care Patient             Cristal Qadir Colette Ribas, OT 11/01/2021, 9:04 PM

## 2021-11-01 NOTE — Therapy (Signed)
OUTPATIENT OCCUPATIONAL THERAPY TREATMENT NOTE  Patient Name: Teresa [name] MRN: [id number] DOB:1940-11-14, 81 y.o., female Today's Date: 11/01/2021  PCP: Dr. [name] REFERRING PROVIDER: Dr. [name]     OT End of Session - 11/01/21 2132     Visit Number 46    Number of Visits 48    Date for OT Re-Evaluation 11/08/21    OT Start Time 0930    OT Stop Time 7829    OT Time Calculation (min) 45 min    Activity Tolerance Patient tolerated treatment well    Behavior During Therapy [facility] for tasks assessed/performed             Past Medical History:  Diagnosis Date   Cancer ([place])    skin   Hypothyroidism    Raynaud's disease    Past Surgical History:  Procedure Laterality Date   [place]   BACK SURGERY  2010   Cervical fusion C4-5-6-7   CATARACT EXTRACTION, BILATERAL Bilateral 10/2016   CHOLECYSTECTOMY  1995   COLONOSCOPY WITH PROPOFOL N/A 12/08/2018   Procedure: COLONOSCOPY WITH PROPOFOL;  Surgeon: Toledo, Benay Pike, MD;  Location: [facility] ENDOSCOPY;  Service: Gastroenterology;  Laterality: N/A;   EYE SURGERY     Patient Active Problem List   Diagnosis Date Noted   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   Stroke ([place]) 02/27/2021   History of nonmelanoma skin cancer 05/23/2014    ONSET DATE: 02/26/2021  REFERRING DIAG: R MCA CVA  THERAPY DIAG:  Muscle weakness (generalized)  Other lack of coordination  Right middle cerebral artery stroke [facility])  Rationale for Evaluation and Treatment Rehabilitation  PERTINENT HISTORY: February 26, 2021, pt reports she had a CVA, came to [facility] to ER and then was transferred to [facility] in [place] where she was admitted and after acute care she went to inpatient rehab.  Following inpt rehab, pt went home and had home health.   PRECAUTIONS: fall  SUBJECTIVE:  Pt reports continued pain in both right and left UEs which limits her ability to perform  daily tasks. PAIN:  Are you having pain? Yes: NPRS scale: 3/10 Pain location:  R shoulder  Pain description: sore Aggravating factors: N/A Relieving factors: rest, heat, otc pain meds   OBJECTIVE:  TODAY'S TREATMENT:  Moist heat applied to R shoulder throughout session with simultaneous completion of therapeutic exercise for the LUE, working to reduce pain and muscle stiffness.  Patient continues to report pain in the right shoulder, most likely due to overuse and compensation for not using her left hand and arm in the same capacity prior to her CVA.    Therapeutic Exercise: Completed passive L shoulder flex/abd/horiz abd/add, ER, forearm pron/sup, L wrist flex/ext, and digit flex/ext all digits in prep for functional activities/exercises.  1 pound weighted dowel exercises for chest press, shoulder flexion towards overhead press, elbow flexion extension with use of bilateral upper extremities for 10 repetitions, 1 set.  Patient reporting increased pain with right upper extremity during tasks therefore switch to unilateral weighted dowel use with left upper extremity, placed in a vertical position for shoulder flexion, chest press and elbow flexion extension for another set of 10 repetitions.  Resistive Velcro board for left gripping, 5 repetitions to move large cylindrical dowel forward and back, lateral key pinch for 5 repetitions, and small dowel for finger extension 5 repetitions, cues for proper form and technique.  Grasp and release of objects with  left hand to pick up from tabletop and release into large bucket.      PATIENT EDUCATION: Education details: normal movement patterns when reaching with LUE, working to reduce compensatory leaning from the trunk. Person educated: pt Education method: Explanation and Verbal cues Education comprehension: verbalized understanding, demonstration   HOME EXERCISE PROGRAM Continue to engage LUE into ADLs; continue gripping and pinching exercises  for LUE, and L shoulder AROM/AAROM, crochet      OT Long Term Goals -       OT LONG TERM GOAL #1   Title Pt will be independent with home exercise program.    Baseline Eval: no current program, 10th visit:  continue to add new exercises as pt progresses, 20th:  continue to update HEP; 08/17/21: continue to progress HEP when indicated.  Visit 40: adding new exercises as pt progresses   Time 12    Period Weeks    Status On-going    Target Date 11/08/21      OT LONG TERM GOAL #2   Title Pt will complete UB and LB dressing with modified independence including buttons, snaps and zippers.    Baseline requires min assist at eval, 10th visit: occasional assist with buttons, 20th:  able to perform one handed, but difficulty with bilateral UE; 08/17/21: pt reports inconsistent with 1 hand, reviewed techniques this visit, Visit 40:  Pt requires assist with bra   Time 12    Period Weeks    Status On-going    Target Date 11/08/21      OT LONG TERM GOAL #3   Title Pt will perform shower transfer with modified independence.    Baseline Pt requires supervision to min assist for shower transfer at home. 10th visit: supervision; 08/17/21: supv .  Visit 40:  Pt had met goal but had recent fall and now requiring min guard to supv   Time 6    Period Weeks    Status Partially Met    Target Date 09/27/21      OT LONG TERM GOAL #4   Title Pt will improve L hand grip by 10# to assist with holding items in left hand securely.    Baseline no grip in left hand at eval, 10th visit:  improved flexion but still working towards composite fisting and grip. 20th:  continues to demo decreased grip; 08/17/21: active digit flexion improving, but not yet able to register grip on dynamometer; 09/04/21: L grip 1# 7/17:  5#    Time 12    Period Weeks    Status On-going    Target Date 11/08/21      OT LONG TERM GOAL #5   Title Pt will improve left shoulder flexion to 100 degrees or better to improve reaching to obtain self  care items from shelf/shoulder height.    Baseline difficulty with reach, shoulder flexion to 47 degrees; 08/17/21: L shoulder flexion 85, but not yet able to consistently hold ADL supplies in L hand when reaching; 09/04/21: flexion 85 7/17:  shoulder flexion to 90   Time 12    Period Weeks    Status On-going    Target Date 11/08/21      OT LONG TERM GOAL #6   Title Pt will improve FOTO score to 47 or above to demonstrate a clinically relevant change in function to impact ADL tasks.    Baseline score of 30 at eval; 08/17/21: FOTO: 42; 09/04/21: FOTO : 54 , FOTO 7/7:  60   Time  12    Period Weeks    Status On-going    Target Date 11/08/21      OT LONG TERM GOAL #7   Title Pt will demonstrate ability to pick up small objects and complete 9 hole peg test in less than 2 mins.    Baseline unable to perform at eval, 10th visit: still unable to pick up small pegs, 20th: unable to pick up pegs but improving; 08/17/21: not attempted d/t time constraints, will assess next visit; 09/04/21: 9 hole peg test in 5 min 53 sec;  7/17:  Pt unable to complete this date but was able to place 6 of 9 pegs in 4 mins 20 secs   Time 12    Period Weeks    Status On-going    Target Date 11/08/21              Plan -     Clinical Impression Statement Patient remains limited in right upper extremity motion and strength due to increased pain with overuse and compensation from left upper extremity.  Patient also has some mild pain in left upper extremity with attempts for overhead reach and return from shoulder flexion.  Her hand function continues to improve with overall grasp and release of objects, lateral pinch and prehension patterns for larger objects.  She continues to demonstrate limitations in precision and manipulation of smaller objects less than 1 inch in size.  Will continue to work towards modifying tasks as needed to decrease pain and inflammation on the right upper extremity while working to restore  function in left upper extremity.  Pt will continue to benefit from skilled OT to work towards goals in plan of care to improve LUE functional use for ADL and IADL tasks.   OT Occupational Profile and History Detailed Assessment- Review of Records and additional review of physical, cognitive, psychosocial history related to current functional performance    Occupational performance deficits (Please refer to evaluation for details): ADL's;IADL's;Leisure;Rest and Sleep    Body Structure / Function / Physical Skills ADL;Coordination;Endurance;GMC;UE functional use;Balance;IADL;Pain;Dexterity;FMC;Strength;Edema;Mobility;ROM    Psychosocial Skills Environmental  Adaptations;Habits;Routines and Behaviors    Rehab Potential Good    Clinical Decision Making Several treatment options, min-mod task modification necessary    Comorbidities Affecting Occupational Performance: May have comorbidities impacting occupational performance    Modification or Assistance to Complete Evaluation  Min-Moderate modification of tasks or assist with assess necessary to complete eval    OT Frequency 2x / week    OT Duration 12 weeks    OT Treatment/Interventions Self-care/ADL training;Cryotherapy;Paraffin;Therapeutic exercise;DME and/or AE instruction;Functional Mobility Training;Balance training;Electrical Stimulation;Ultrasound;Neuromuscular education;Manual Therapy;Splinting;Moist Heat;Contrast Bath;Passive range of motion;Therapeutic activities;Patient/family education;Coping strategies training    Plan OT recert    Consulted and Agree with Plan of Care Patient             Ramona Ruark Oneita Jolly, OTR/L, CLT  Chanc Kervin, OT 11/02/2021, 9:07 AM

## 2021-11-01 NOTE — Therapy (Signed)
OUTPATIENT PHYSICAL THERAPY LOWER EXTREMITY EVALUATION   Patient Name: Teresa [name] MRN: [id number] DOB:04/28/1940, 81 y.o., female Today's Date: 11/02/2021   PT End of Session - 11/02/21 0750     Visit Number 1    Number of Visits 25    Date for PT Re-Evaluation 01/24/22    PT Start Time 0849    PT Stop Time 0930    PT Time Calculation (min) 41 min    Equipment Utilized During Treatment Gait belt    Activity Tolerance Patient tolerated treatment well;No increased pain    Behavior During Therapy WFL for tasks assessed/performed             Past Medical History:  Diagnosis Date   Cancer ([place])    skin   Hypothyroidism    Raynaud's disease    Past Surgical History:  Procedure Laterality Date   ABDOMINAL HYSTERECTOMY  1991   APPENDECTOMY  1991   BACK SURGERY  2010   Cervical fusion C4-5-6-7   CATARACT EXTRACTION, BILATERAL Bilateral 10/2016   CHOLECYSTECTOMY  1995   COLONOSCOPY WITH PROPOFOL N/A 12/08/2018   Procedure: COLONOSCOPY WITH PROPOFOL;  Surgeon: Toledo, Benay Pike, MD;  Location: [facility] ENDOSCOPY;  Service: Gastroenterology;  Laterality: N/A;   EYE SURGERY     Patient Active Problem List   Diagnosis Date Noted   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 Pt last Monday had home visit with nurse who tested her for osteoporosis. Pt reports no osteoporosis.    PERTINENT  HISTORY: On June 24th, 2023 pt fell pulling up her pajama pants at home, slipped, and fractured her R ankle (closed traumatic nondisplaced fracture of posterior malleolus of R tibia). Pt had been working with PT for R side MCA CVA (02/26/21). Pt now returns to PT. She is WBAT per chart (Dr. Doy Hutching). Pt uses a quad cane for ambulation. She is not wearing an ankle brace, but does report she wears a brace when out for longer periods of time (does not currently have it with her for exam). She reports she has difficulty with balance with turning and feels she is dragging her LLE. Pt reports she has no ankle pain. Feels her balance is generally off. She reports she has been moving her R ankle and walking on it for exercise. Pt is not aware of any other restrictions other than to wear her brace. Pt reports she was discharged from working with her physician for her ankle due to good progress.  Pt has not had any other falls since the one in June, just stumbling with turns. She continues to work with OT for her UE s/p CVA. PMH includes: skin cancer, hypothyroidism, and Raynaud's disease, history of cervical fusion in 2010 C4-C7, R side MCA CVA 02/26/21.  PAIN:  Are you having pain? Yes: NPRS scale: 4/10 Pain location: BUEs, shoulders, neck Pain description: achey, cracking Aggravating factors: Shoulder flexion of  BUEs Relieving factors: heat  PRECAUTIONS: Fall  WEIGHT BEARING RESTRICTIONS Yes WBAT, pt has R ankle brace but reports only wears for long-distance walking  FALLS:  Has patient fallen in last 6 months? Yes. Number of falls 1  LIVING ENVIRONMENT: Lives with: lives with their spouse Lives in: House/apartment Stairs: Yes: External: 1 steps; none Does have second floor, with handrail going up on L, but hasn't gone upstairs per report Has following equipment at home: Quad cane small base, Environmental consultant - 2 wheeled, Wheelchair (manual), and Grab bars   PLOF: Independent with household mobility with device  and reports husband has helped with bathing since stroke  PATIENT GOALS  Improve her balance with turning and not dragging her feet, "the left leg is dragging again"   OBJECTIVE:   DIAGNOSTIC FINDINGS:  Information via chart - Scarlett Presto, PA 10/23/2021: "Xrays of the right ankle were ordered and interpreted 10/23/2021, with 3 views using AP, lateral, and mortise views. Xrays revealed the right ankle with a posterior malleolus nondisplaced fracture with no angulation or rotation. The ankle mortise is well-maintained. There is significant callus formation or fracture healing noted on today's x-rays."    PATIENT SURVEYS:  LEFS 44/80   FOTO: unable to complete/deferred to second session due to set-up error  COGNITION:  Overall cognitive status: Within functional limits for tasks assessed     SENSATION: WFL BLE, denies any n/t in BLE  EDEMA:  None observed currently, but does report swelling later in the day in her B ankles "just with the socks"  MUSCLE LENGTH: Hamstrings: Right -18 deg; Left -19 deg deg from neutral   POSTURE: rounded shoulders and increased thoracic kyphosis  PALPATION: Medial lower leg just superior to medial malleolus feels "bruised"  LOWER EXTREMITY ROM:  Active ROM Right eval Left eval  Hip flexion    Hip extension    Hip abduction    Hip adduction    Hip internal rotation    Hip external rotation    Knee flexion    Knee extension    Ankle dorsiflexion 1 5  Ankle plantarflexion ~50 ~50  Ankle inversion    Ankle eversion     (Blank rows = not tested)  LOWER EXTREMITY MMT:   BLE grossly 4+/5 (*did not test R ankle musculature today)   FUNCTIONAL TESTS:  10 meter walk test: 0.83 m/s with QC  GAIT: Distance walked: 10 meters  Assistive device utilized: Quad cane small base Level of assistance: SBA Comments: Reduced heel-strike LLE/mid-foot slap, reduced trunk rotation  Balance: Tandem: RLE as primary stance leg- 10 sec LLE as  primary stance leg- approx 30 sec SLB:   LLE 3 sec  RLE not tested  Merrilee Jansky: deferred  TODAY'S TREATMENT: Eval only   PATIENT EDUCATION:  Education details: assessment, plan, goals Person educated: Patient Education method: Explanation, Demonstration, Tactile cues, and Verbal cues Education comprehension: verbalized understanding and returned demonstration   HOME EXERCISE PROGRAM: To be initiated next 1-2 sessions  ASSESSMENT:  CLINICAL IMPRESSION: Patient is a pleasant 81 y.o. female who was seen today for physical therapy evaluation and treatment s/p R ankle fracture June 2023. Examination reveals deficits in LE strength, ROM, gait, balance and pain AEB MMT, goniometric assessment, 10MWT, balance assessment, palpation and LEFs. These impairments place the pt at an increased risk for future falls. The pt would benefit from further skilled PT to address these deficits in order to increase ease and safety with ADLs and ambulation and to decrease  fall risk.    OBJECTIVE IMPAIRMENTS Abnormal gait, decreased activity tolerance, decreased balance, decreased coordination, decreased endurance, decreased mobility, difficulty walking, decreased ROM, decreased strength, hypomobility, increased edema, impaired flexibility, impaired UE functional use, improper body mechanics, postural dysfunction, and pain.   ACTIVITY LIMITATIONS carrying, lifting, standing, stairs, bed mobility, bathing, dressing, reach over head, hygiene/grooming, and locomotion level  PARTICIPATION LIMITATIONS: meal prep, cleaning, laundry, driving, shopping, community activity, and yard work  PERSONAL FACTORS Age, Past/current experiences, Sex, and 3+ comorbidities: hypothyroidism, and Raynaud's disease, history of cervical fusion in 2010 C4-C7, R side MCA CVA 02/26/21  are also affecting patient's functional outcome.   REHAB POTENTIAL: Good  CLINICAL DECISION MAKING: Stable/uncomplicated  EVALUATION COMPLEXITY:  Moderate   GOALS: Goals reviewed with patient? Yes  SHORT TERM GOALS: Target date: 12/27/2021  Patient will be independent in home exercise program to improve strength/mobility for better functional independence with ADLs. Baseline: to be initiated next 1-2 sessions Goal status: INITIAL LONG TERM GOALS: Target date: 01/24/2022   Patient will increase FOTO score to equal to or greater than     to demonstrate statistically significant improvement in mobility and quality of life.  Baseline: to be completed next session  Goal status: INITIAL  2.   Patient will increase 10 meter walk test to >1.63ms as to improve gait speed for better community ambulation and to reduce fall risk. Baseline: 0.83 m/s with QC Goal status: INITIAL  3.  Patient will increase BLE gross strength by 1/2 point on MMT as to improve functional strength for independent gait, increased standing tolerance and increased ADL ability. Baseline: grossly 4+/5 BLE, with exception of R ankle testing deferred at eval Goal status: INITIAL  4.   Patient will demonstrate an improved Berg Balance Score of at least 3 points or greater as to demonstrate improved balance with ADLs such as sitting/standing and transfer balance and reduced fall risk.  Baseline: to be tested next 1-2 sessions Goal status: INITIAL  5.  Patient will increase lower extremity functional scale to >60/80 to demonstrate improved functional mobility and increased tolerance with ADLs.  Baseline: 44/80 Goal status: INITIAL     PLAN: PT FREQUENCY: 2x/week  PT DURATION: 12 weeks  PLANNED INTERVENTIONS: Therapeutic exercises, Therapeutic activity, Neuromuscular re-education, Balance training, Gait training, Patient/Family education, Self Care, Joint mobilization, Joint manipulation, Stair training, Vestibular training, Canalith repositioning, Visual/preceptual remediation/compensation, Orthotic/Fit training, DME instructions, Electrical stimulation, Spinal  mobilization, Cryotherapy, Moist heat, Splintting, Taping, Ultrasound, Manual therapy, and Re-evaluation  PLAN FOR NEXT SESSION: complete further assessment, initiate HEP, balance and strength training, gentle mobility R ankle, isometrics   HZollie Pee PT 11/02/2021, 8:29 AM

## 2021-11-06 ENCOUNTER — Ambulatory Visit: Payer: Medicare PPO | Admitting: Occupational Therapy

## 2021-11-06 ENCOUNTER — Ambulatory Visit: Payer: Medicare PPO

## 2021-11-06 DIAGNOSIS — R278 Other lack of coordination: Secondary | ICD-10-CM

## 2021-11-06 DIAGNOSIS — M6281 Muscle weakness (generalized): Secondary | ICD-10-CM

## 2021-11-06 DIAGNOSIS — G8929 Other chronic pain: Secondary | ICD-10-CM

## 2021-11-06 DIAGNOSIS — R2681 Unsteadiness on feet: Secondary | ICD-10-CM

## 2021-11-06 DIAGNOSIS — I63511 Cerebral infarction due to unspecified occlusion or stenosis of right middle cerebral artery: Secondary | ICD-10-CM

## 2021-11-06 NOTE — Therapy (Signed)
OUTPATIENT PHYSICAL THERAPY LOWER EXTREMITY TREATMENT   Patient Name: Teresa [name] MRN: [id number] DOB:03-09-1941, 81 y.o., female Today's Date: 11/06/2021   PT End of Session - 11/06/21 1320     Visit Number 2    Number of Visits 25    Date for PT Re-Evaluation 01/24/22    PT Start Time 0850    PT Stop Time 0930    PT Time Calculation (min) 40 min    Equipment Utilized During Treatment Gait belt    Activity Tolerance Patient tolerated treatment well;No increased pain    Behavior During Therapy WFL for tasks assessed/performed              Past Medical History:  Diagnosis Date   Cancer ([place])    skin   Hypothyroidism    Raynaud's disease    Past Surgical History:  Procedure Laterality Date   ABDOMINAL HYSTERECTOMY  1991   APPENDECTOMY  1991   BACK SURGERY  2010   Cervical fusion C4-5-6-7   CATARACT EXTRACTION, BILATERAL Bilateral 10/2016   CHOLECYSTECTOMY  1995   COLONOSCOPY WITH PROPOFOL N/A 12/08/2018   Procedure: COLONOSCOPY WITH PROPOFOL;  Surgeon: Toledo, Benay Pike, MD;  Location: [facility] ENDOSCOPY;  Service: Gastroenterology;  Laterality: N/A;   EYE SURGERY     Patient Active Problem List   Diagnosis Date Noted   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 "I'm not pulling my arms in when I'm walking." Reports no pain. Using her QC.   PAIN:  Are you having pain? Yes: NPRS scale: 0/10 Pain location: BUEs, shoulders, neck Pain  description: achey, cracking Aggravating factors: Shoulder flexion of BUEs Relieving factors: heat  PERTINENT HISTORY: On June 24th, 2023 pt fell pulling up her pajama pants at home, slipped, and fractured her R ankle (closed traumatic nondisplaced fracture of posterior malleolus of R tibia). Pt had been working with PT for R side MCA CVA (02/26/21). Pt now returns to PT. She is WBAT per chart (Dr. Doy Hutching). Pt uses a quad cane for ambulation. She is not wearing an ankle brace, but does report she wears a brace when out for longer periods of time (does not currently have it with her for exam). She reports she has difficulty with balance with turning and feels she is dragging her LLE. Pt reports she has no ankle pain. Feels her balance is generally off. She reports she has been moving her R ankle and walking on it for exercise. Pt is not aware of any other restrictions other than to wear her brace. Pt reports she was discharged from working with her physician for her ankle due to good progress.  Pt has not had any other falls since the one in June, just stumbling with turns. She continues to work with OT for her UE s/p CVA. PMH includes: skin cancer, hypothyroidism, and Raynaud's disease, history of cervical fusion in 2010 C4-C7, R side MCA CVA 02/26/21.  PRECAUTIONS: Fall  WEIGHT BEARING RESTRICTIONS Yes WBAT, pt has  R ankle brace but reports only wears for long-distance walking  FALLS:  Has patient fallen in last 6 months? Yes. Number of falls 1  LIVING ENVIRONMENT: Lives with: lives with their spouse Lives in: House/apartment Stairs: Yes: External: 1 steps; none Does have second floor, with handrail going up on L, but hasn't gone upstairs per report Has following equipment at home: Quad cane small base, Environmental consultant - 2 wheeled, Wheelchair (manual), and Grab bars   PLOF: Independent with household mobility with device and reports husband has helped with bathing since stroke  PATIENT GOALS  Improve  her balance with turning and not dragging her feet, "the left leg is dragging again"   OBJECTIVE:  Taken at eval unless indicated otherwise DIAGNOSTIC FINDINGS:  Information via chart - Scarlett Presto, PA 10/23/2021: "Xrays of the right ankle were ordered and interpreted 10/23/2021, with 3 views using AP, lateral, and mortise views. Xrays revealed the right ankle with a posterior malleolus nondisplaced fracture with no angulation or rotation. The ankle mortise is well-maintained. There is significant callus formation or fracture healing noted on today's x-rays."    PATIENT SURVEYS:  LEFS 44/80   FOTO: 47 (taken 11/06/2021)  COGNITION:  Overall cognitive status: Within functional limits for tasks assessed     SENSATION: WFL BLE, denies any n/t in BLE  EDEMA:  None observed currently, but does report swelling later in the day in her B ankles "just with the socks"  MUSCLE LENGTH: Hamstrings: Right -18 deg; Left -19 deg deg from neutral   POSTURE: rounded shoulders and increased thoracic kyphosis  PALPATION: Medial lower leg just superior to medial malleolus feels "bruised"  LOWER EXTREMITY ROM:  Active ROM Right eval Left eval  Ankle dorsiflexion 1 5  Ankle plantarflexion ~50 ~50   (Blank rows = not tested)  LOWER EXTREMITY MMT:   BLE grossly 4+/5 (*did not test R ankle musculature today)   FUNCTIONAL TESTS:  10 meter walk test: 0.83 m/s with QC  Berg: 51/56 taken 11/06/2021  TODAY'S TREATMENT:  BERG: 51/56  FOTO: 47 (63)  Assisted and educated pt in donning/doffing R ASO brace   Hooklying on plinth:  RLE ankle pump 20x R ankle alphabet 1 round SLR 15x each LE  Isometrics R ankle PF/DF 10x 5 sec holds of each within pain-free range  NMR: Gait belt donned and CGA provided throughout Amb with SPC cue for heel-toe sequencing (heel strike) 2x148 with close CGA. Occ foot scuff Amb with and without SPC around and through cones 6x. Knocks over cones multiple  times, difficulty negotiating obstacles  HEP issued (see below)  PATIENT EDUCATION:  Education details: further assessment, HEP, exercise technique, body mechanics Person educated: Patient Education method: Explanation, Demonstration, Tactile cues, Verbal cues, and Handouts Education comprehension: verbalized understanding and returned demonstration   HOME EXERCISE PROGRAM: 8/15: Access Code: [email or web address] URL: [email or web address] Date: 11/06/2021 Prepared by: Ricard Dillon  Exercises - Supine Single Leg Ankle Pumps  - 1 x daily - 7 x weekly - 2 sets - 20 reps - Supine Active Straight Leg Raise  - 1 x daily - 5 x weekly - 2 sets - 15 reps  ASSESSMENT:  CLINICAL IMPRESSION: Further assessment completed. Pt score 51/56 on BERG indicates fall risk. Initial FOTO score is 47 and will continued to be monitored throughout reporting period for improvements to indicate increased functional mobility and QOL. Initiated HEP on this date. The pt would benefit from further skilled PT to address these deficits in order  to increase ease and safety with ADLs and ambulation and to decrease fall risk.    OBJECTIVE IMPAIRMENTS Abnormal gait, decreased activity tolerance, decreased balance, decreased coordination, decreased endurance, decreased mobility, difficulty walking, decreased ROM, decreased strength, hypomobility, increased edema, impaired flexibility, impaired UE functional use, improper body mechanics, postural dysfunction, and pain.   ACTIVITY LIMITATIONS carrying, lifting, standing, stairs, bed mobility, bathing, dressing, reach over head, hygiene/grooming, and locomotion level  PARTICIPATION LIMITATIONS: meal prep, cleaning, laundry, driving, shopping, community activity, and yard work  PERSONAL FACTORS Age, Past/current experiences, Sex, and 3+ comorbidities: hypothyroidism, and Raynaud's disease, history of cervical fusion in 2010 C4-C7, R side MCA CVA 02/26/21  are also affecting  patient's functional outcome.   REHAB POTENTIAL: Good  CLINICAL DECISION MAKING: Stable/uncomplicated  EVALUATION COMPLEXITY: Moderate   GOALS: Goals reviewed with patient? Yes  SHORT TERM GOALS: Target date: 12/27/2021  Patient will be independent in home exercise program to improve strength/mobility for better functional independence with ADLs. Baseline: to be initiated next 1-2 sessions; 8/15: initiated Goal status: INITIAL LONG TERM GOALS: Target date: 01/24/2022   Patient will increase FOTO score to equal to or greater than  63  to demonstrate statistically significant improvement in mobility and quality of life.  Baseline: 8/15: 47 Goal status: INITIAL  2.   Patient will increase 10 meter walk test to >1.32ms as to improve gait speed for better community ambulation and to reduce fall risk. Baseline: 0.83 m/s with QC Goal status: INITIAL  3.  Patient will increase BLE gross strength by 1/2 point on MMT as to improve functional strength for independent gait, increased standing tolerance and increased ADL ability. Baseline: grossly 4+/5 BLE, with exception of R ankle testing deferred at eval Goal status: INITIAL  4.   Patient will demonstrate an improved Berg Balance Score of at least 3 points or greater as to demonstrate improved balance with ADLs such as sitting/standing and transfer balance and reduced fall risk.  Baseline: 8/15: 51 Goal status: INITIAL  5.  Patient will increase lower extremity functional scale to >60/80 to demonstrate improved functional mobility and increased tolerance with ADLs.  Baseline: 44/80 Goal status: INITIAL     PLAN: PT FREQUENCY: 2x/week  PT DURATION: 12 weeks  PLANNED INTERVENTIONS: Therapeutic exercises, Therapeutic activity, Neuromuscular re-education, Balance training, Gait training, Patient/Family education, Self Care, Joint mobilization, Joint manipulation, Stair training, Vestibular training, Canalith repositioning,  Visual/preceptual remediation/compensation, Orthotic/Fit training, DME instructions, Electrical stimulation, Spinal mobilization, Cryotherapy, Moist heat, Splintting, Taping, Ultrasound, Manual therapy, and Re-evaluation  PLAN FOR NEXT SESSION:  balance and strength training, gentle mobility R ankle, isometrics, gait training   HZollie Pee PT 11/06/2021, 1:29 PM

## 2021-11-08 ENCOUNTER — Ambulatory Visit: Payer: Medicare PPO | Admitting: Occupational Therapy

## 2021-11-08 ENCOUNTER — Encounter: Payer: Self-pay | Admitting: Occupational Therapy

## 2021-11-08 ENCOUNTER — Ambulatory Visit: Payer: Medicare PPO

## 2021-11-08 DIAGNOSIS — I63511 Cerebral infarction due to unspecified occlusion or stenosis of right middle cerebral artery: Secondary | ICD-10-CM

## 2021-11-08 DIAGNOSIS — R2681 Unsteadiness on feet: Secondary | ICD-10-CM

## 2021-11-08 DIAGNOSIS — M6281 Muscle weakness (generalized): Secondary | ICD-10-CM

## 2021-11-08 DIAGNOSIS — R278 Other lack of coordination: Secondary | ICD-10-CM

## 2021-11-08 DIAGNOSIS — R262 Difficulty in walking, not elsewhere classified: Secondary | ICD-10-CM

## 2021-11-08 DIAGNOSIS — G8929 Other chronic pain: Secondary | ICD-10-CM

## 2021-11-08 NOTE — Therapy (Signed)
OUTPATIENT OCCUPATIONAL THERAPY TREATMENT NOTE  Patient Name: Teresa [name] MRN: [id number] DOB:02-17-1941, 81 y.o., female Today's Date: 11/06/2021  PCP: Dr. [name] REFERRING PROVIDER: Dr. [name]     OT End of Session - 11/07/21 1439     Visit Number 47    Number of Visits 48    Date for OT Re-Evaluation 11/08/21    OT Start Time 0930    OT Stop Time 1020    OT Time Calculation (min) 50 min    Activity Tolerance Patient tolerated treatment well    Behavior During Therapy [facility] for tasks assessed/performed             Past Medical History:  Diagnosis Date   Cancer ([place])    skin   Hypothyroidism    Raynaud's disease    Past Surgical History:  Procedure Laterality Date   Page   BACK SURGERY  2010   Cervical fusion C4-5-6-7   CATARACT EXTRACTION, BILATERAL Bilateral 10/2016   CHOLECYSTECTOMY  1995   COLONOSCOPY WITH PROPOFOL N/A 12/08/2018   Procedure: COLONOSCOPY WITH PROPOFOL;  Surgeon: Toledo, Benay Pike, MD;  Location: [facility] ENDOSCOPY;  Service: Gastroenterology;  Laterality: N/A;   EYE SURGERY     Patient Active Problem List   Diagnosis Date Noted   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  Following inpt rehab, pt went home and had home health.   PRECAUTIONS: fall  SUBJECTIVE:  Pt reports 2/10 left arm, right shoulder 3/10  PAIN:   Are you having pain? Yes: NPRS scale: 3/10 Pain location:  R shoulder  Pain description: sore Aggravating factors: N/A Relieving factors: rest, heat, otc pain meds   OBJECTIVE:  TODAY'S TREATMENT:  Moist heat applied to R shoulder throughout session with simultaneous completion of therapeutic exercise for the LUE, working to reduce pain and muscle stiffness.  Patient continues to report pain in the right shoulder, most likely due to overuse and compensation for not using her left hand and arm in the same capacity prior to her CVA.    Therapeutic Exercise: Saebo looped ball tower in sitting, left hand use able to move to 2nd level.  Difficulty with placing balls on 3rd level, switched to a ball grasp rather than the loop grasp and patient was able to complete.  Difficulty reaching to remove from 3rd level.  Composite fisting with left, able to get index finger to palm, middle and ring close to palm but not quite touching.   Neuromuscular Reeducation:  Manipulation of cards with emphasis on thumb finger combinations to flip cards.  Then worked towards picking up cards from flat surface with left.   Cards moving to right and left off the deck with the thumb Thumb with place and hold of the card Glass beads picking up from flat surface, some with flat side up, others with flat side down, placing into container,  cues for prehension patterns.       PATIENT EDUCATION: Education details: normal movement patterns when reaching with LUE, working to reduce compensatory leaning from the trunk. Person educated: pt Education method: Explanation and Verbal cues Education comprehension: verbalized understanding, demonstration   HOME EXERCISE PROGRAM Continue to engage LUE into ADLs; continue gripping and pinching exercises for LUE, and L shoulder AROM/AAROM, crochet      OT Long Term Goals -       OT LONG TERM GOAL #1   Title Pt will be independent with home exercise program.    Baseline  Eval: no current program, 10th visit:  continue to add new exercises as pt progresses, 20th:  continue to update HEP; 08/17/21: continue to progress HEP when indicated.  Visit 40: adding new exercises as pt progresses   Time 12    Period Weeks    Status On-going    Target Date 11/08/21      OT LONG TERM GOAL #2   Title Pt will complete UB and LB dressing with modified independence including buttons, snaps and zippers.    Baseline requires min assist at eval, 10th visit: occasional assist with buttons, 20th:  able to perform one handed, but difficulty with bilateral UE; 08/17/21: pt reports inconsistent with 1 hand, reviewed techniques this visit, Visit 40:  Pt requires assist with bra   Time 12    Period Weeks    Status On-going    Target Date 11/08/21      OT LONG TERM GOAL #3   Title Pt will perform shower transfer with modified independence.    Baseline Pt requires supervision to min assist for shower transfer at home. 10th visit: supervision; 08/17/21: supv .  Visit 40:  Pt had met goal but had recent fall and now requiring min guard to supv   Time 6    Period Weeks    Status Partially Met    Target Date 09/27/21      OT LONG TERM GOAL #4   Title Pt will improve L hand grip by 10# to assist with holding items in left hand securely.    Baseline no grip in left hand at eval, 10th visit:  improved flexion but still working towards composite fisting and grip. 20th:  continues to demo decreased grip; 08/17/21: active digit flexion improving, but not yet able to register grip on dynamometer; 09/04/21: L grip 1# 7/17:  5#    Time 12    Period Weeks    Status On-going    Target Date 11/08/21      OT LONG TERM GOAL #5   Title Pt will improve left shoulder flexion to 100 degrees or better to improve reaching to obtain self care items from shelf/shoulder height.    Baseline difficulty with reach, shoulder flexion to 47 degrees; 08/17/21: L shoulder flexion 85, but not yet able to consistently hold  ADL supplies in L hand when reaching; 09/04/21: flexion 85 7/17:  shoulder flexion to 90   Time 12    Period Weeks    Status On-going    Target Date 11/08/21      OT LONG TERM GOAL #6   Title Pt will improve FOTO score to 47 or above to demonstrate a clinically relevant change in function to impact ADL tasks.    Baseline score of 30 at eval; 08/17/21: FOTO: 42; 09/04/21: FOTO : 54 , FOTO 7/7:  60   Time 12    Period Weeks  Status On-going    Target Date 11/08/21      OT LONG TERM GOAL #7   Title Pt will demonstrate ability to pick up small objects and complete 9 hole peg test in less than 2 mins.    Baseline unable to perform at eval, 10th visit: still unable to pick up small pegs, 20th: unable to pick up pegs but improving; 08/17/21: not attempted d/t time constraints, will assess next visit; 09/04/21: 9 hole peg test in 5 min 53 sec;  7/17:  Pt unable to complete this date but was able to place 6 of 9 pegs in 4 mins 20 secs   Time 12    Period Weeks    Status On-going    Target Date 11/08/21              Plan -     Clinical Impression Statement Patient remains limited in right upper extremity motion and strength due to increased pain with overuse and compensation from left upper extremity.  She has continued to demonstrate steady improvements in left UE with grasp/release and working towards manipulation of objects/FMC skills.  She continues to drop items frequently when engaging in tasks which require manipulation and more precision movements of the left UE.  Limited thumb movement/coordination on the left side but responded well to task using cards with thumb movements.  Reaching with LUE to about 110 degrees of shoulder flexion but has some pain and compensatory patterns towards this range.  Will plan to do recertfication next session with goal update and measurements.  Pt will continue to benefit from skilled OT to work towards goals in plan of care to improve LUE functional use for  ADL and IADL tasks.   OT Occupational Profile and History Detailed Assessment- Review of Records and additional review of physical, cognitive, psychosocial history related to current functional performance    Occupational performance deficits (Please refer to evaluation for details): ADL's;IADL's;Leisure;Rest and Sleep    Body Structure / Function / Physical Skills ADL;Coordination;Endurance;GMC;UE functional use;Balance;IADL;Pain;Dexterity;FMC;Strength;Edema;Mobility;ROM    Psychosocial Skills Environmental  Adaptations;Habits;Routines and Behaviors    Rehab Potential Good    Clinical Decision Making Several treatment options, min-mod task modification necessary    Comorbidities Affecting Occupational Performance: May have comorbidities impacting occupational performance    Modification or Assistance to Complete Evaluation  Min-Moderate modification of tasks or assist with assess necessary to complete eval    OT Frequency 2x / week    OT Duration 12 weeks    OT Treatment/Interventions Self-care/ADL training;Cryotherapy;Paraffin;Therapeutic exercise;DME and/or AE instruction;Functional Mobility Training;Balance training;Electrical Stimulation;Ultrasound;Neuromuscular education;Manual Therapy;Splinting;Moist Heat;Contrast Bath;Passive range of motion;Therapeutic activities;Patient/family education;Coping strategies training    Plan OT recert    Consulted and Agree with Plan of Care Patient             [place] Colette Ribas, OT 11/08/2021, 2:47 PM

## 2021-11-08 NOTE — Therapy (Signed)
OUTPATIENT PHYSICAL THERAPY LOWER EXTREMITY TREATMENT   Patient Name: Laren Orama MRN: [id number] DOB:[birth year], 81 y.o., female Today's Date: 11/08/2021   PT End of Session - 11/08/21 0928     Visit Number 3    Number of Visits 25    Date for PT Re-Evaluation 01/24/22    PT Start Time 0932    PT Stop Time 1015    PT Time Calculation (min) 43 min    Equipment Utilized During Treatment Gait belt    Activity Tolerance Patient tolerated treatment well;No increased pain    Behavior During Therapy WFL for tasks assessed/performed               Past Medical History:  Diagnosis Date   Cancer ([place])    skin   Hypothyroidism    Raynaud's disease    Past Surgical History:  Procedure Laterality Date   ABDOMINAL HYSTERECTOMY  1991   APPENDECTOMY  1991   BACK SURGERY  2010   Cervical fusion C4-5-6-7   CATARACT EXTRACTION, BILATERAL Bilateral 10/2016   CHOLECYSTECTOMY  1995   COLONOSCOPY WITH PROPOFOL N/A 12/08/2018   Procedure: COLONOSCOPY WITH PROPOFOL;  Surgeon: Toledo, Benay Pike, MD;  Location: [facility] ENDOSCOPY;  Service: Gastroenterology;  Laterality: N/A;   EYE SURGERY     Patient Active Problem List   Diagnosis Date Noted   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 Wearing her R ASO brace currently, amb with QC. Pt has no other current concerns.   PAIN:  Are you having pain? Yes: NPRS scale: 0/10 Pain location: BUEs, shoulders, neck Pain  description: achey, cracking Aggravating factors: Shoulder flexion of BUEs Relieving factors: heat  PERTINENT HISTORY: On June 24th, 2023 pt fell pulling up her pajama pants at home, slipped, and fractured her R ankle (closed traumatic nondisplaced fracture of posterior malleolus of R tibia). Pt had been working with PT for R side MCA CVA (02/26/21). Pt now returns to PT. She is WBAT per chart (Dr. Doy Hutching). Pt uses a quad cane for ambulation. She is not wearing an ankle brace, but does report she wears a brace when out for longer periods of time (does not currently have it with her for exam). She reports she has difficulty with balance with turning and feels she is dragging her LLE. Pt reports she has no ankle pain. Feels her balance is generally off. She reports she has been moving her R ankle and walking on it for exercise. Pt is not aware of any other restrictions other than to wear her brace. Pt reports she was discharged from working with her physician for her ankle due to good progress.  Pt has not had any other falls since the one in June, just stumbling with turns. She continues to work with OT for her UE s/p CVA. PMH includes: skin cancer, hypothyroidism, and Raynaud's disease, history of cervical fusion in 2010 C4-C7, R side MCA CVA 02/26/21.  PRECAUTIONS: Fall  WEIGHT BEARING RESTRICTIONS Yes WBAT, pt has R  ankle brace but reports only wears for long-distance walking  FALLS:  Has patient fallen in last 6 months? Yes. Number of falls 1  LIVING ENVIRONMENT: Lives with: lives with their spouse Lives in: House/apartment Stairs: Yes: External: 1 steps; none Does have second floor, with handrail going up on L, but hasn't gone upstairs per report Has following equipment at home: Quad cane small base, Environmental consultant - 2 wheeled, Wheelchair (manual), and Grab bars   PLOF: Independent with household mobility with device and reports husband has helped with bathing since stroke  PATIENT GOALS  Improve  her balance with turning and not dragging her feet, "the left leg is dragging again"   OBJECTIVE:  Taken at eval unless indicated otherwise DIAGNOSTIC FINDINGS:  Information via chart - Scarlett Presto, PA 10/23/2021: "Xrays of the right ankle were ordered and interpreted 10/23/2021, with 3 views using AP, lateral, and mortise views. Xrays revealed the right ankle with a posterior malleolus nondisplaced fracture with no angulation or rotation. The ankle mortise is well-maintained. There is significant callus formation or fracture healing noted on today's x-rays."    PATIENT SURVEYS:  LEFS 44/80   FOTO: 47 (taken 11/06/2021)  COGNITION:  Overall cognitive status: Within functional limits for tasks assessed     SENSATION: WFL BLE, denies any n/t in BLE  EDEMA:  None observed currently, but does report swelling later in the day in her B ankles "just with the socks"  MUSCLE LENGTH: Hamstrings: Right -18 deg; Left -19 deg deg from neutral   POSTURE: rounded shoulders and increased thoracic kyphosis  PALPATION: Medial lower leg just superior to medial malleolus feels "bruised"  LOWER EXTREMITY ROM:  Active ROM Right eval Left eval  Ankle dorsiflexion 1 5  Ankle plantarflexion ~50 ~50   (Blank rows = not tested)  LOWER EXTREMITY MMT:   BLE grossly 4+/5 (*did not test R ankle musculature today)   FUNCTIONAL TESTS:  10 meter walk test: 0.83 m/s with QC  Berg: 51/56 taken 11/06/2021  TODAY'S TREATMENT: 11/08/21  Assisted and educated pt in donning/doffing R ASO brace   Hooklying on plinth: SLR 15x 2 sets each LE. Rates easy RLE ankle pump 20x R ankle alphabet 1 round Sidelying hip abduction 15x each LE  PT hellps donn/doff ASO brace    R:  AAROM Inversion/eversion 10x each  AROM inversion/eversion 20x each   Isometrics R ankle PF/DF/Inv/Eversion 15x for each  STS 1x10   Staggered STS with RLE as primary stance leg 10x     NMR: Gait belt donned and CGA  provided throughout  Amb without cane tandem walking around clinic 1 lap  Amb with and without SPC around and through cones.   -Progressed to performing with dual cog task, and then to performing with dual and cog and motor task. Used L and R UE to hold onto object for motor task  Progressed to use of half-foam for LE clearance. Knocks over cones multiple times with addition of dual tasks. Most errors with dual motor task using affected UE.  No pain with interventions   PATIENT EDUCATION:  Education details: exercise technique, body mechanics Person educated: Patient Education method: Explanation, Demonstration, Tactile cues, and Verbal cues Education comprehension: verbalized understanding and returned demonstration   HOME EXERCISE PROGRAM: no updates on this date, pt to continue HEP as previously given 8/15: Access Code: [email or web address] URL: [email or web address] Date: 11/06/2021 Prepared by: Ricard Dillon  Exercises - Supine Single Leg Ankle Pumps  - 1 x daily -  7 x weekly - 2 sets - 20 reps - Supine Active Straight Leg Raise  - 1 x daily - 5 x weekly - 2 sets - 15 reps  ASSESSMENT:  CLINICAL IMPRESSION: Pt with excellent motivation to participate in session. Tolerates interventions well without pain. Continued to provide instruction in donning/doffing ASO. Pt able to progress to more challenging balance intervention with negotiating cones, stepping over obstacles and performing with dual cog and motor task. Most errors with use of affect UE with dual motor task. The pt would benefit from further skilled PT to address these deficits in order to increase ease and safety with ADLs and ambulation and to decrease fall risk.    OBJECTIVE IMPAIRMENTS Abnormal gait, decreased activity tolerance, decreased balance, decreased coordination, decreased endurance, decreased mobility, difficulty walking, decreased ROM, decreased strength, hypomobility, increased edema, impaired  flexibility, impaired UE functional use, improper body mechanics, postural dysfunction, and pain.   ACTIVITY LIMITATIONS carrying, lifting, standing, stairs, bed mobility, bathing, dressing, reach over head, hygiene/grooming, and locomotion level  PARTICIPATION LIMITATIONS: meal prep, cleaning, laundry, driving, shopping, community activity, and yard work  PERSONAL FACTORS Age, Past/current experiences, Sex, and 3+ comorbidities: hypothyroidism, and Raynaud's disease, history of cervical fusion in 2010 C4-C7, R side MCA CVA 02/26/21  are also affecting patient's functional outcome.   REHAB POTENTIAL: Good  CLINICAL DECISION MAKING: Stable/uncomplicated  EVALUATION COMPLEXITY: Moderate   GOALS: Goals reviewed with patient? Yes  SHORT TERM GOALS: Target date: 12/27/2021  Patient will be independent in home exercise program to improve strength/mobility for better functional independence with ADLs. Baseline: to be initiated next 1-2 sessions; 8/15: initiated Goal status: INITIAL LONG TERM GOALS: Target date: 01/24/2022   Patient will increase FOTO score to equal to or greater than  63  to demonstrate statistically significant improvement in mobility and quality of life.  Baseline: 8/15: 47 Goal status: INITIAL  2.   Patient will increase 10 meter walk test to >1.71ms as to improve gait speed for better community ambulation and to reduce fall risk. Baseline: 0.83 m/s with QC Goal status: INITIAL  3.  Patient will increase BLE gross strength by 1/2 point on MMT as to improve functional strength for independent gait, increased standing tolerance and increased ADL ability. Baseline: grossly 4+/5 BLE, with exception of R ankle testing deferred at eval Goal status: INITIAL  4.   Patient will demonstrate an improved Berg Balance Score of at least 3 points or greater as to demonstrate improved balance with ADLs such as sitting/standing and transfer balance and reduced fall risk.  Baseline:  8/15: 51 Goal status: INITIAL  5.  Patient will increase lower extremity functional scale to >60/80 to demonstrate improved functional mobility and increased tolerance with ADLs.  Baseline: 44/80 Goal status: INITIAL     PLAN: PT FREQUENCY: 2x/week  PT DURATION: 12 weeks  PLANNED INTERVENTIONS: Therapeutic exercises, Therapeutic activity, Neuromuscular re-education, Balance training, Gait training, Patient/Family education, Self Care, Joint mobilization, Joint manipulation, Stair training, Vestibular training, Canalith repositioning, Visual/preceptual remediation/compensation, Orthotic/Fit training, DME instructions, Electrical stimulation, Spinal mobilization, Cryotherapy, Moist heat, Splintting, Taping, Ultrasound, Manual therapy, and Re-evaluation  PLAN FOR NEXT SESSION:  balance and strength training, gentle mobility R ankle, isometrics, gait training, continue plan   HZollie Pee PT 11/08/2021, 10:53 AM

## 2021-11-08 NOTE — Therapy (Signed)
OUTPATIENT OCCUPATIONAL THERAPY TREATMENT NOTE/RECERTIFICATION  Patient Name: Teresa [name] MRN: [id number] DOB:1940-12-19, 81 y.o., female Today's Date: 11/08/2021  PCP: Dr. [name] REFERRING PROVIDER: Dr. [name]     OT End of Session - 11/13/21 1233     Visit Number 48    Number of Visits 34    Date for OT Re-Evaluation 01/31/22    OT Start Time 0846    OT Stop Time 0930    OT Time Calculation (min) 44 min    Activity Tolerance Patient tolerated treatment well    Behavior During Therapy [facility] for tasks assessed/performed              Past Medical History:  Diagnosis Date   Cancer ([place])    skin   Hypothyroidism    Raynaud's disease    Past Surgical History:  Procedure Laterality Date   [place]   BACK SURGERY  2010   Cervical fusion C4-5-6-7   CATARACT EXTRACTION, BILATERAL Bilateral 10/2016   CHOLECYSTECTOMY  1995   COLONOSCOPY WITH PROPOFOL N/A 12/08/2018   Procedure: COLONOSCOPY WITH PROPOFOL;  Surgeon: Toledo, Benay Pike, MD;  Location: [facility] ENDOSCOPY;  Service: Gastroenterology;  Laterality: N/A;   EYE SURGERY     Patient Active Problem List   Diagnosis Date Noted   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  Following inpt rehab, pt went home and had home health.   PRECAUTIONS: fall  SUBJECTIVE:   Pain in right shoulder 3/10, left 1/10  along bicep PAIN:  Are you having pain? Yes: NPRS scale: 3/10 Pain location:  R shoulder  Pain description: sore Aggravating factors: N/A Relieving factors: rest, heat, otc pain meds   OBJECTIVE:  TODAY'S TREATMENT:  Moist heat applied to R shoulder throughout session with simultaneous completion of therapeutic exercise for the LUE, working to reduce pain and muscle stiffness.       Readminstered FOTO this date:  score 68  Therapeutic Exercise: Pt seen for reassessment of BUE skills, refer to above flowsheet for details.   Resistive pinch pins on the left, yellow and red completed for 2 rounds  Neuromuscular Reeducation:  9 hole peg test in 4 min 44 secs, pt reports frustration with speed and dexterity but happy she was able to complete test this date.   ADL:   Areas which are improving: Making the bed Folding clothes but has some difficulty with folding t shirt  Washing counters/tables Dressing self and now able to don and doff her bra with modified independence  Difficult: Clean glass tabletops picking up the glass  Crocheting Dropping items, grasping with one hand.     PATIENT EDUCATION: Education details: normal movement patterns when reaching with LUE, working to reduce compensatory leaning from the trunk, goals, POC, progress  Person educated: pt Education method: Merchandiser, retail cues Education comprehension: verbalized understanding, demonstration   [place]  Continue to engage LUE into ADLs; continue gripping and pinching exercises for LUE, and L shoulder AROM/AAROM, crochet      OT Long Term Goals -       OT LONG TERM GOAL #1   Title Pt will be independent with home exercise program.    Baseline Eval: no current program, 10th visit:  continue to add new exercises as pt progresses, 20th:  continue to update HEP; 08/17/21: continue to progress HEP when indicated.  Visit 40: adding new exercises as pt progresses.  Recert 2/56: continually  changing HEP as pt progresses    Time 12    Period Weeks    Status On-going    Target Date 01/31/22      OT LONG TERM GOAL #2   Title Pt will complete UB and LB dressing with modified independence including buttons, snaps and zippers.    Baseline requires min assist at eval, 10th visit: occasional assist with buttons, 20th:  able to perform one handed, but difficulty with bilateral UE; 08/17/21: pt reports inconsistent with 1 hand, reviewed techniques this visit, Visit 40:  Pt requires assist with bra;  Recert:  Modified independent including bra.   Time 12    Period Weeks    Status Met    Target Date 11/08/21      OT LONG TERM GOAL #3   Title Pt will perform shower transfer with modified independence.    Baseline Pt requires supervision to min assist for shower transfer at home. 10th visit: supervision; 08/17/21: supv .  Visit 40:  Pt had met goal but had recent fall and now requiring min guard to supv.  Recert 3/89:  modified independence   Time 6    Period Weeks    Status Met    Target Date 11/08/21      OT LONG TERM GOAL #4   Title Pt will improve L hand grip by 10# to assist with holding items in left hand securely.    Baseline no grip in left hand at eval, 10th visit:  improved flexion but still working towards composite fisting and grip. 20th:  continues to demo decreased grip; 08/17/21: active digit flexion improving, but not yet able to register grip on dynamometer; 09/04/21: L grip 1# 3/73:  5# .  Recert 4/28: 6#   Time 12    Period Weeks    Status On-going    Target Date 01/31/22      OT LONG TERM GOAL #5   Title Pt will improve left shoulder flexion to 100 degrees or better to improve reaching to obtain self care items from shelf/shoulder height.    Baseline difficulty with reach, shoulder flexion to 47 degrees; 08/17/21: L shoulder flexion 85, but not yet able to consistently hold ADL supplies in L hand when reaching; 09/04/21: flexion 85 7/17:  shoulder flexion to 90.  Recert  7/68:  115 degrees but continues to demo difficulty with reaching above shoulder height and depending on size, shape of item for reaching   Time 12    Period Weeks    Status On-going    Target Date 01/31/22      OT LONG TERM GOAL #6   Title Pt will improve FOTO score to 47 or above to demonstrate a clinically relevant change in function to impact ADL tasks.    Baseline score of 30 at eval; 08/17/21: FOTO: 42; 09/04/21: FOTO : 54 , FOTO 7/7:  60;REcert 7/26: 68   Time 12  Period Weeks    Status  Met    Target Date 11/08/21      OT LONG TERM GOAL #7   Title Pt will demonstrate ability to pick up small objects and complete 9 hole peg test in less than 2 mins.    Baseline unable to perform at eval, 10th visit: still unable to pick up small pegs, 20th: unable to pick up pegs but improving; 08/17/21: not attempted d/t time constraints, will assess next visit; 09/04/21: 9 hole peg test in 5 min 53 sec;  7/17:  Pt unable to complete this date but was able to place 6 of 9 pegs in 4 mins 20 secs.  Recert:  able to complete test in 4 mins, 44 sec   Time 12    Period Weeks    Status On-going    Target Date 11/08/21              Plan -     Clinical Impression Statement Pt has continued to progress in all areas.  She has been limited by pain in the unaffected right UE likely due to overuse, some mild pain also still in left shoulder with reaching greater than 100 degrees.  She has progressed with ROM of left shoulder to 108 degrees of flexion.  Her grip and pinch skills have continued to improve over time with recorded measurements as well as functional grasping patterns.  She has been frustrated in the past with attempts at 9 hole peg test but was able to complete the test today in 4 min 44 sec on left.  She continues to benefit from skilled OT services to maximize safety and independence in necessary daily tasks.     OT Occupational Profile and History Detailed Assessment- Review of Records and  additional review of physical, cognitive, psychosocial history related to current functional performance    Occupational performance deficits (Please refer to evaluation for details): ADL's;IADL's;Leisure;Rest and Sleep    Body Structure / Function / Physical Skills ADL;Coordination;Endurance;GMC;UE functional use;Balance;IADL;Pain;Dexterity;FMC;Strength;Edema;Mobility;ROM    Psychosocial Skills Environmental  Adaptations;Habits;Routines and Behaviors    Rehab Potential Good    Clinical Decision Making Several treatment options, min-mod task modification necessary    Comorbidities Affecting Occupational Performance: May have comorbidities impacting occupational performance    Modification or Assistance to Complete Evaluation  Min-Moderate modification of tasks or assist with assess necessary to complete eval    OT Frequency 2x / week    OT Duration 12 weeks    OT Treatment/Interventions Self-care/ADL training;Cryotherapy;Paraffin;Therapeutic exercise;DME and/or AE instruction;Functional Mobility Training;Balance training;Electrical Stimulation;Ultrasound;Neuromuscular education;Manual Therapy;Splinting;Moist Heat;Contrast Bath;Passive range of motion;Therapeutic activities;Patient/family education;Coping strategies training    Plan OT recert    Consulted and Agree with Plan of Care Patient             Amy Colette Ribas, OT 11/13/2021, 12:34 PM

## 2021-11-13 ENCOUNTER — Ambulatory Visit: Payer: Medicare PPO

## 2021-11-13 DIAGNOSIS — R2681 Unsteadiness on feet: Secondary | ICD-10-CM

## 2021-11-13 DIAGNOSIS — M6281 Muscle weakness (generalized): Secondary | ICD-10-CM

## 2021-11-13 DIAGNOSIS — R262 Difficulty in walking, not elsewhere classified: Secondary | ICD-10-CM

## 2021-11-13 DIAGNOSIS — I63511 Cerebral infarction due to unspecified occlusion or stenosis of right middle cerebral artery: Secondary | ICD-10-CM

## 2021-11-13 DIAGNOSIS — R278 Other lack of coordination: Secondary | ICD-10-CM

## 2021-11-13 NOTE — Therapy (Signed)
OUTPATIENT PHYSICAL THERAPY LOWER EXTREMITY TREATMENT   Patient Name: Teresa [name] MRN: [id number] DOB:1941/03/08, 81 y.o., female Today's Date: 11/13/2021   PT End of Session - 11/13/21 1419     Visit Number 4    Number of Visits 25    Date for PT Re-Evaluation 01/24/22    PT Start Time 1104    PT Stop Time 1144    PT Time Calculation (min) 40 min    Equipment Utilized During Treatment Gait belt    Activity Tolerance Patient tolerated treatment well;No increased pain    Behavior During Therapy WFL for tasks assessed/performed                Past Medical History:  Diagnosis Date   Cancer ([place])    skin   Hypothyroidism    Raynaud's disease    Past Surgical History:  Procedure Laterality Date   ABDOMINAL HYSTERECTOMY  1991   APPENDECTOMY  1991   BACK SURGERY  2010   Cervical fusion C4-5-6-7   CATARACT EXTRACTION, BILATERAL Bilateral 10/2016   CHOLECYSTECTOMY  1995   COLONOSCOPY WITH PROPOFOL N/A 12/08/2018   Procedure: COLONOSCOPY WITH PROPOFOL;  Surgeon: Toledo, Benay Pike, MD;  Location: [facility] ENDOSCOPY;  Service: Gastroenterology;  Laterality: N/A;   EYE SURGERY     Patient Active Problem List   Diagnosis Date Noted   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 She reports no other concerns. Reports doing HEP. Has been walking 2 blocks at a time without pain.   PAIN:  Are you having pain? Yes: NPRS scale: 0/10 Pain location: BUEs, shoulders, neck Pain  description: achey, cracking Aggravating factors: Shoulder flexion of BUEs Relieving factors: heat  PERTINENT HISTORY: On June 24th, 2023 pt fell pulling up her pajama pants at home, slipped, and fractured her R ankle (closed traumatic nondisplaced fracture of posterior malleolus of R tibia). Pt had been working with PT for R side MCA CVA (02/26/21). Pt now returns to PT. She is WBAT per chart (Dr. Doy Hutching). Pt uses a quad cane for ambulation. She is not wearing an ankle brace, but does report she wears a brace when out for longer periods of time (does not currently have it with her for exam). She reports she has difficulty with balance with turning and feels she is dragging her LLE. Pt reports she has no ankle pain. Feels her balance is generally off. She reports she has been moving her R ankle and walking on it for exercise. Pt is not aware of any other restrictions other than to wear her brace. Pt reports she was discharged from working with her physician for her ankle due to good progress.  Pt has not had any other falls since the one in June, just stumbling with turns. She continues to work with OT for her UE s/p CVA. PMH includes: skin cancer, hypothyroidism, and Raynaud's disease, history of cervical fusion in 2010 C4-C7, R side MCA CVA 02/26/21.  PRECAUTIONS: Fall  WEIGHT BEARING RESTRICTIONS Yes WBAT, pt has  R ankle brace but reports only wears for long-distance walking  FALLS:  Has patient fallen in last 6 months? Yes. Number of falls 1  LIVING ENVIRONMENT: Lives with: lives with their spouse Lives in: House/apartment Stairs: Yes: External: 1 steps; none Does have second floor, with handrail going up on L, but hasn't gone upstairs per report Has following equipment at home: Quad cane small base, Environmental consultant - 2 wheeled, Wheelchair (manual), and Grab bars   PLOF: Independent with household mobility with device and reports husband has helped with bathing since stroke  PATIENT GOALS  Improve  her balance with turning and not dragging her feet, "the left leg is dragging again"   OBJECTIVE:  Taken at eval unless indicated otherwise DIAGNOSTIC FINDINGS:  Information via chart - Scarlett Presto, PA 10/23/2021: "Xrays of the right ankle were ordered and interpreted 10/23/2021, with 3 views using AP, lateral, and mortise views. Xrays revealed the right ankle with a posterior malleolus nondisplaced fracture with no angulation or rotation. The ankle mortise is well-maintained. There is significant callus formation or fracture healing noted on today's x-rays."    PATIENT SURVEYS:  LEFS 44/80   FOTO: 47 (taken 11/06/2021)  COGNITION:  Overall cognitive status: Within functional limits for tasks assessed     SENSATION: WFL BLE, denies any n/t in BLE  EDEMA:  None observed currently, but does report swelling later in the day in her B ankles "just with the socks"  MUSCLE LENGTH: Hamstrings: Right -18 deg; Left -19 deg deg from neutral   POSTURE: rounded shoulders and increased thoracic kyphosis  PALPATION: Medial lower leg just superior to medial malleolus feels "bruised"  LOWER EXTREMITY ROM:  Active ROM Right eval Left eval  Ankle dorsiflexion 1 5  Ankle plantarflexion ~50 ~50   (Blank rows = not tested)  LOWER EXTREMITY MMT:   BLE grossly 4+/5 (*did not test R ankle musculature today)   FUNCTIONAL TESTS:  10 meter walk test: 0.83 m/s with QC  Berg: 51/56 taken 11/06/2021  TODAY'S TREATMENT: 11/13/21  Hooklying on plinth: SLR 15x 2 sets each LE. Rates easy  Added 2# AW (placed above ankle so to not sit directly on ankle) 10x each LE  Assisted pt in donning/doffing R ASO brace  so she could perform ankle interventions  RLE ankle pump 20x R ankle alphabet 1 round Supine gastrocsol stretch (PT-assisted) 2x30 sec LLE  Isometrics R ankle PF/DF/Inv/Eversion 10x for each into red p.ball  Seated toe crunches x 4 min   STS 2x10 cuing for technique, able to  perform hands free with instruction.  Attempt at postural education with seated thoracic ext over chair, pt with difficulty coordination attempts- 10x. PT must provide TC, pt compensates with cervical ext, exhibits limited thoracic ext.  Seated trunk twists 10x each side   Seated heel raises 20x B  No pain with interventions   PATIENT EDUCATION:  Education details: exercise technique, body mechanics Person educated: Patient Education method: Explanation, Demonstration, Tactile cues, and Verbal cues Education comprehension: verbalized understanding and returned demonstration   HOME EXERCISE PROGRAM: no updates on this date, pt to continue HEP as previously given 8/15: Access Code: [email or web address] URL: [email or web address] Date: 11/06/2021 Prepared by: Ricard Dillon  Exercises - Supine Single Leg Ankle Pumps  - 1 x daily - 7 x weekly - 2 sets - 20 reps - Supine Active Straight Leg Raise  - 1 x daily - 5 x weekly - 2 sets - 15 reps  ASSESSMENT:  CLINICAL  IMPRESSION: Continued strengthening and mobility interventions for R ankle, pt progressing nicely and reports no pain with interventions. Next session to perform interventions with light-resistance via theraband. PT also provided instruction on postural exercises (seated) and performing STS hands-free to improve comfort and safety with transfers.  The pt would benefit from further skilled PT to address these deficits in order to increase ease and safety with ADLs and ambulation and to decrease fall risk.    OBJECTIVE IMPAIRMENTS Abnormal gait, decreased activity tolerance, decreased balance, decreased coordination, decreased endurance, decreased mobility, difficulty walking, decreased ROM, decreased strength, hypomobility, increased edema, impaired flexibility, impaired UE functional use, improper body mechanics, postural dysfunction, and pain.   ACTIVITY LIMITATIONS carrying, lifting, standing, stairs, bed mobility, bathing,  dressing, reach over head, hygiene/grooming, and locomotion level  PARTICIPATION LIMITATIONS: meal prep, cleaning, laundry, driving, shopping, community activity, and yard work  PERSONAL FACTORS Age, Past/current experiences, Sex, and 3+ comorbidities: hypothyroidism, and Raynaud's disease, history of cervical fusion in 2010 C4-C7, R side MCA CVA 02/26/21  are also affecting patient's functional outcome.   REHAB POTENTIAL: Good  CLINICAL DECISION MAKING: Stable/uncomplicated  EVALUATION COMPLEXITY: Moderate   GOALS: Goals reviewed with patient? Yes  SHORT TERM GOALS: Target date: 12/27/2021  Patient will be independent in home exercise program to improve strength/mobility for better functional independence with ADLs. Baseline: to be initiated next 1-2 sessions; 8/15: initiated Goal status: INITIAL LONG TERM GOALS: Target date: 01/24/2022   Patient will increase FOTO score to equal to or greater than  63  to demonstrate statistically significant improvement in mobility and quality of life.  Baseline: 8/15: 47 Goal status: INITIAL  2.   Patient will increase 10 meter walk test to >1.50ms as to improve gait speed for better community ambulation and to reduce fall risk. Baseline: 0.83 m/s with QC Goal status: INITIAL  3.  Patient will increase BLE gross strength by 1/2 point on MMT as to improve functional strength for independent gait, increased standing tolerance and increased ADL ability. Baseline: grossly 4+/5 BLE, with exception of R ankle testing deferred at eval Goal status: INITIAL  4.   Patient will demonstrate an improved Berg Balance Score of at least 3 points or greater as to demonstrate improved balance with ADLs such as sitting/standing and transfer balance and reduced fall risk.  Baseline: 8/15: 51 Goal status: INITIAL  5.  Patient will increase lower extremity functional scale to >60/80 to demonstrate improved functional mobility and increased tolerance with ADLs.   Baseline: 44/80 Goal status: INITIAL     PLAN: PT FREQUENCY: 2x/week  PT DURATION: 12 weeks  PLANNED INTERVENTIONS: Therapeutic exercises, Therapeutic activity, Neuromuscular re-education, Balance training, Gait training, Patient/Family education, Self Care, Joint mobilization, Joint manipulation, Stair training, Vestibular training, Canalith repositioning, Visual/preceptual remediation/compensation, Orthotic/Fit training, DME instructions, Electrical stimulation, Spinal mobilization, Cryotherapy, Moist heat, Splintting, Taping, Ultrasound, Manual therapy, and Re-evaluation  PLAN FOR NEXT SESSION:  balance and strength training, gentle mobility R ankle, isometrics, gait training, continue plan   HZollie Pee PT 11/13/2021, 2:20 PM

## 2021-11-14 NOTE — Therapy (Signed)
OUTPATIENT OCCUPATIONAL THERAPY TREATMENT NOTE  Patient Name: Teresa [name] MRN: [id number] DOB:[birth year], 81 y.o., female Today's Date: 10/08/2021  PCP: Dr. [name] REFERRING PROVIDER: Dr. [name]   OT End of Session - 11/14/21 1321     Visit Number 49    Number of Visits 72    Date for OT Re-Evaluation 01/31/22    OT Start Time 4356    OT Stop Time 1230    OT Time Calculation (min) 45 min    Activity Tolerance Patient tolerated treatment well    Behavior During Therapy [facility] for tasks assessed/performed             Past Medical History:  Diagnosis Date   Cancer ([place])    skin   Hypothyroidism    Raynaud's disease    Past Surgical History:  Procedure Laterality Date   [place]   BACK SURGERY  2010   Cervical fusion C4-5-6-7   CATARACT EXTRACTION, BILATERAL Bilateral 10/2016   CHOLECYSTECTOMY  1995   COLONOSCOPY WITH PROPOFOL N/A 12/08/2018   Procedure: COLONOSCOPY WITH PROPOFOL;  Surgeon: Toledo, Benay Pike, MD;  Location: [facility] ENDOSCOPY;  Service: Gastroenterology;  Laterality: N/A;   EYE SURGERY     Patient Active Problem List   Diagnosis Date Noted   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  Following inpt rehab, pt went home and had home health.   PRECAUTIONS: fall  SUBJECTIVE:   Pt reports her husband thinks her speech has been more slurred over the past couple of  days.  Pt spoke with OT and SLP who both felt pt's speech seemed unchanged.  (BP taken; see below)   PAIN:  Are you having pain? Yes: NPRS scale: 0/10 Pain location:  R shoulder  Pain description: sore Aggravating factors: N/A Relieving factors: rest, heat, otc pain meds   OBJECTIVE:  TODAY'S TREATMENT:  Moist heat applied to R shoulder throughout session with simultaneous completion of therapeutic exercise for the LUE, working to reduce muscle stiffness.     Therapeutic Exercise: BP measured at 147/79 on the L.  Reviewed signs, symptoms of CVA with pt d/t spouse's concern of pt's worsening speech and facial droop.  Speech did not appear any different, per OT and SLP this date.   Pt has had slight drooping at the mouth on the L since her stroke, and mouth appeared unchanged.  Pt understands emergency measures to take if speech or facial drooping does seem to worsen (call 911/go to ER). Completed passive L shoulder flex/abd/horiz abd/add, ER, forearm pron/sup, L wrist flex/ext, and digit flex/ext all digits in prep for functional activities/exercises.  Completed 3 sets 10 reps of hand gripper with 1 red band for resistance, rest and passive digit and wrist extension stretch between sets.    Neuro re-ed: Facilitated activities for increased L hand dexterity skills, working to pick up small glass stones from table top.  Pt was  able to scoop up to 2-3 at once from a group of 5.  When picking up 1 at a time, pt was able to store 2-3 in hand, and then practiced translatory movements moving stones from palm to fingertip in prep for discarding onto table top.    PATIENT EDUCATION: Education details: L hand dexterity skills; s/s of CVA and when to call 911 or go to ED. Person educated: pt Education method: Explanation and Verbal cues Education comprehension: verbalized understanding   HOME EXERCISE PROGRAM Continue to engage LUE into ADLs; continue gripping and pinching exercises for LUE, and L  shoulder AROM/AAROM, crochet      OT Long Term Goals -       OT LONG TERM GOAL #1   Title Pt will be independent with home exercise program.    Baseline Eval: no current program, 10th visit:  continue to add new exercises as pt progresses, 20th:  continue to update HEP; 08/17/21: continue to progress HEP when indicated.  Visit 40: adding new exercises as pt progresses   Time 12    Period Weeks    Status On-going    Target Date 11/08/21      OT LONG TERM GOAL #2   Title Pt will complete UB and LB dressing with modified independence including buttons, snaps and zippers.    Baseline requires min assist at eval, 10th visit: occasional assist with buttons, 20th:  able to perform one handed, but difficulty with bilateral UE; 08/17/21: pt reports inconsistent with 1 hand, reviewed techniques this visit, Visit 40:  Pt requires assist with bra   Time 12    Period Weeks    Status On-going    Target Date 11/08/21      OT LONG TERM GOAL #3   Title Pt will perform shower transfer with modified independence.    Baseline Pt requires supervision to min assist for shower transfer at home. 10th visit: supervision; 08/17/21: supv .  Visit 40:  Pt had met goal but had recent fall and now requiring min guard to supv   Time 6    Period Weeks    Status Partially Met    Target Date 09/27/21      OT LONG TERM GOAL #4   Title Pt will improve L hand grip by 10# to assist with holding items in left hand securely.    Baseline no grip in left hand at eval, 10th visit:  improved flexion but still working towards composite fisting and grip. 20th:  continues to demo decreased grip; 08/17/21: active digit flexion improving, but not yet able to register grip on dynamometer; 09/04/21: L grip 1# 7/17:  5#    Time 12    Period Weeks    Status On-going    Target Date 11/08/21      OT LONG TERM GOAL #5   Title Pt will improve left shoulder flexion to 100 degrees or better to improve reaching to obtain self care items from  shelf/shoulder height.    Baseline difficulty with reach, shoulder flexion to 47 degrees; 08/17/21: L shoulder flexion 85, but not yet able to consistently hold ADL supplies in L hand when reaching; 09/04/21: flexion 85 7/17:  shoulder flexion to 90   Time 12    Period Weeks    Status On-going    Target Date 11/08/21      OT LONG TERM GOAL #6   Title Pt will improve FOTO score to 47 or above to demonstrate a  clinically relevant change in function to impact ADL tasks.    Baseline score of 30 at eval; 08/17/21: FOTO: 42; 09/04/21: FOTO : 54 , FOTO 7/7:  60   Time 12    Period Weeks    Status On-going    Target Date 11/08/21      OT LONG TERM GOAL #7   Title Pt will demonstrate ability to pick up small objects and complete 9 hole peg test in less than 2 mins.    Baseline unable to perform at eval, 10th visit: still unable to pick up small pegs, 20th: unable to pick up pegs but improving; 08/17/21: not attempted d/t time constraints, will assess next visit; 09/04/21: 9 hole peg test in 5 min 53 sec;  7/17:  Pt unable to complete this date but was able to place 6 of 9 pegs in 4 mins 20 secs   Time 12    Period Weeks    Status On-going    Target Date 11/08/21              Plan -     Clinical Impression Statement BP measured at 147/79 on the L.  Reviewed signs, symptoms of CVA with pt d/t spouse's concern of pt's worsening speech and facial droop.  Speech did not appear any different, per OT and SLP this date.   Pt has had slight drooping at the mouth on the L since her stroke, and mouth appeared unchanged.  Pt understands emergency measures to take if speech or facial drooping does seem to worsen (call 911/go to ER).  Pt continues to make steady improvements with L hand digit flexibility and dexterity.  Pt was able to store 2-3 glass stones in L palm before dropping them.  Stiffness in L hand digits limit full active composite fist, which causes dropping of small objects from palm of hand.  Pt  reports that her bilat shoulder pain is gone, and L shoulder flexibility continues to improve.  Pt tolerated all passive stretching throughout the LUE well this date.  Though improving, pt continues to present with stiffness and weakness in the L shoulder, forearm, wrist, and hand.  Pt will continue to benefit from skilled OT to work towards goals in plan of care to improve LUE functional use for ADL and IADL tasks.   OT Occupational Profile and History Detailed Assessment- Review of Records and additional review of physical, cognitive, psychosocial history related to current functional performance    Occupational performance deficits (Please refer to evaluation for details): ADL's;IADL's;Leisure;Rest and Sleep    Body Structure / Function / Physical Skills ADL;Coordination;Endurance;GMC;UE functional use;Balance;IADL;Pain;Dexterity;FMC;Strength;Edema;Mobility;ROM    Psychosocial Skills Environmental  Adaptations;Habits;Routines and Behaviors    Rehab Potential Good    Clinical Decision Making Several treatment options, min-mod task modification necessary    Comorbidities Affecting Occupational Performance: May have comorbidities impacting occupational performance    Modification or Assistance to Complete Evaluation  Min-Moderate modification of tasks or assist with assess necessary to complete eval    OT Frequency 2x / week    OT Duration 12 weeks    OT Treatment/Interventions Self-care/ADL training;Cryotherapy;Paraffin;Therapeutic exercise;DME and/or AE instruction;Functional Mobility Training;Balance training;Electrical Stimulation;Ultrasound;Neuromuscular education;Manual Therapy;Splinting;Moist Heat;Contrast Bath;Passive range of motion;Therapeutic activities;Patient/family education;Coping strategies training    Plan OT recert    Consulted and Agree with Plan of Care Patient             Danelle Earthly, MS, OTR/L   Otis Dials, OT 11/14/2021, 1:24 PM

## 2021-11-15 ENCOUNTER — Ambulatory Visit: Payer: Medicare PPO | Admitting: Physical Therapy

## 2021-11-15 ENCOUNTER — Encounter: Payer: Medicare PPO | Admitting: Occupational Therapy

## 2021-11-20 ENCOUNTER — Ambulatory Visit: Payer: Medicare PPO

## 2021-11-20 ENCOUNTER — Ambulatory Visit: Payer: Medicare PPO | Admitting: Occupational Therapy

## 2021-11-20 DIAGNOSIS — R278 Other lack of coordination: Secondary | ICD-10-CM

## 2021-11-20 DIAGNOSIS — R2681 Unsteadiness on feet: Secondary | ICD-10-CM

## 2021-11-20 DIAGNOSIS — M6281 Muscle weakness (generalized): Secondary | ICD-10-CM

## 2021-11-20 DIAGNOSIS — R262 Difficulty in walking, not elsewhere classified: Secondary | ICD-10-CM

## 2021-11-20 NOTE — Therapy (Signed)
OUTPATIENT PHYSICAL THERAPY LOWER EXTREMITY TREATMENT   Patient Name: Teresa [name] MRN: [id number] DOB:[birth year], 81 y.o., female Today's Date: 11/20/2021   PT End of Session - 11/20/21 0957     Visit Number 5    Number of Visits 25    Date for PT Re-Evaluation 01/24/22    PT Start Time 0847    PT Stop Time 0930    PT Time Calculation (min) 43 min    Equipment Utilized During Treatment Gait belt    Activity Tolerance Patient tolerated treatment well;No increased pain    Behavior During Therapy WFL for tasks assessed/performed                 Past Medical History:  Diagnosis Date   Cancer ([place])    skin   Hypothyroidism    Raynaud's disease    Past Surgical History:  Procedure Laterality Date   ABDOMINAL HYSTERECTOMY  1991   APPENDECTOMY  1991   BACK SURGERY  2010   Cervical fusion C4-5-6-7   CATARACT EXTRACTION, BILATERAL Bilateral 10/2016   CHOLECYSTECTOMY  1995   COLONOSCOPY WITH PROPOFOL N/A 12/08/2018   Procedure: COLONOSCOPY WITH PROPOFOL;  Surgeon: Toledo, Benay Pike, MD;  Location: [facility] ENDOSCOPY;  Service: Gastroenterology;  Laterality: N/A;   EYE SURGERY     Patient Active Problem List   Diagnosis Date Noted   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 Pt reports no stumbles/falls. Pt reports no other updates. She walked three houses down from her home without issue to visit her friend.    PAIN:  Are you having pain? Yes: NPRS  scale: 0/10 Pain location: BUEs, shoulders, neck Pain description: achey, cracking Aggravating factors: Shoulder flexion of BUEs Relieving factors: heat  PERTINENT HISTORY: On June 24th, 2023 pt fell pulling up her pajama pants at home, slipped, and fractured her R ankle (closed traumatic nondisplaced fracture of posterior malleolus of R tibia). Pt had been working with PT for R side MCA CVA (02/26/21). Pt now returns to PT. She is WBAT per chart (Dr. Doy Hutching). Pt uses a quad cane for ambulation. She is not wearing an ankle brace, but does report she wears a brace when out for longer periods of time (does not currently have it with her for exam). She reports she has difficulty with balance with turning and feels she is dragging her LLE. Pt reports she has no ankle pain. Feels her balance is generally off. She reports she has been moving her R ankle and walking on it for exercise. Pt is not aware of any other restrictions other than to wear her brace. Pt reports she was discharged from working with her physician for her ankle due to good progress.  Pt has not had any other falls since the one in June, just stumbling with turns. She continues to work with OT for her UE s/p CVA. PMH includes: skin cancer, hypothyroidism, and Raynaud's disease, history of cervical fusion in 2010 C4-C7, R side MCA CVA 02/26/21.  PRECAUTIONS:  Fall  WEIGHT BEARING RESTRICTIONS Yes WBAT, pt has R ankle brace but reports only wears for long-distance walking  FALLS:  Has patient fallen in last 6 months? Yes. Number of falls 1  LIVING ENVIRONMENT: Lives with: lives with their spouse Lives in: House/apartment Stairs: Yes: External: 1 steps; none Does have second floor, with handrail going up on L, but hasn't gone upstairs per report Has following equipment at home: Quad cane small base, Environmental consultant - 2 wheeled, Wheelchair (manual), and Grab bars   PLOF: Independent with household mobility with device and reports husband has  helped with bathing since stroke  PATIENT GOALS  Improve her balance with turning and not dragging her feet, "the left leg is dragging again"   OBJECTIVE:  Taken at eval unless indicated otherwise DIAGNOSTIC FINDINGS:  Information via chart - Scarlett Presto, PA 10/23/2021: "Xrays of the right ankle were ordered and interpreted 10/23/2021, with 3 views using AP, lateral, and mortise views. Xrays revealed the right ankle with a posterior malleolus nondisplaced fracture with no angulation or rotation. The ankle mortise is well-maintained. There is significant callus formation or fracture healing noted on today's x-rays."    PATIENT SURVEYS:  LEFS 44/80   FOTO: 47 (taken 11/06/2021)  COGNITION:  Overall cognitive status: Within functional limits for tasks assessed     SENSATION: WFL BLE, denies any n/t in BLE  EDEMA:  None observed currently, but does report swelling later in the day in her B ankles "just with the socks"  MUSCLE LENGTH: Hamstrings: Right -18 deg; Left -19 deg deg from neutral   POSTURE: rounded shoulders and increased thoracic kyphosis  PALPATION: Medial lower leg just superior to medial malleolus feels "bruised"  LOWER EXTREMITY ROM:  Active ROM Right eval Left eval  Ankle dorsiflexion 1 5  Ankle plantarflexion ~50 ~50   (Blank rows = not tested)  LOWER EXTREMITY MMT:   BLE grossly 4+/5 (*did not test R ankle musculature today)   FUNCTIONAL TESTS:  10 meter walk test: 0.83 m/s with QC  Berg: 51/56 taken 11/06/2021  TODAY'S TREATMENT: 11/20/21   PT dons/doffs pt's  ASO-  Hooklying on plinth: R ankle alphabet x 1 round  R ankle pumps 20x  GTB R ankle 2x15 of the following: DF/PF/Inv/Ev  SLR 15x 2 sets each LE with 2.5# weights donned. Rates easy    STS 2x10 hands-free  Step ups onto 6" step x multiple reps. Rates easy   Obstacle course with 6" step, hurdle and weaving through and tapping 3 cones 4x through  Standing heel raises 20x  B Standing hip abd 2x15 each LE   PATIENT EDUCATION:  Education details: exercise technique, body mechanics Person educated: Patient Education method: Explanation, Demonstration, Tactile cues, and Verbal cues Education comprehension: verbalized understanding and returned demonstration   HOME EXERCISE PROGRAM: no updates on this date, pt to continue HEP as previously given 8/15: Access Code: [email or web address] URL: [email or web address] Date: 11/06/2021 Prepared by: Ricard Dillon  Exercises - Supine Single Leg Ankle Pumps  - 1 x daily - 7 x weekly - 2 sets - 20 reps - Supine Active Straight Leg Raise  - 1 x daily - 5 x weekly - 2 sets - 15 reps  ASSESSMENT:  CLINICAL IMPRESSION: Pt shows progress by tolerating increased resistance with therex and more challenging balance exercises without pain. While pt does show progress, she rates interventions as exhausting, indicating decreased activity tolerance. Will continue to focus on improving pt's endurance.  The pt would  benefit from further skilled PT to address these deficits in order to increase ease and safety with ADLs and ambulation and to decrease fall risk.    OBJECTIVE IMPAIRMENTS Abnormal gait, decreased activity tolerance, decreased balance, decreased coordination, decreased endurance, decreased mobility, difficulty walking, decreased ROM, decreased strength, hypomobility, increased edema, impaired flexibility, impaired UE functional use, improper body mechanics, postural dysfunction, and pain.   ACTIVITY LIMITATIONS carrying, lifting, standing, stairs, bed mobility, bathing, dressing, reach over head, hygiene/grooming, and locomotion level  PARTICIPATION LIMITATIONS: meal prep, cleaning, laundry, driving, shopping, community activity, and yard work  PERSONAL FACTORS Age, Past/current experiences, Sex, and 3+ comorbidities: hypothyroidism, and Raynaud's disease, history of cervical fusion in 2010 C4-C7, R side MCA CVA 02/26/21   are also affecting patient's functional outcome.   REHAB POTENTIAL: Good  CLINICAL DECISION MAKING: Stable/uncomplicated  EVALUATION COMPLEXITY: Moderate   GOALS: Goals reviewed with patient? Yes  SHORT TERM GOALS: Target date: 12/27/2021  Patient will be independent in home exercise program to improve strength/mobility for better functional independence with ADLs. Baseline: to be initiated next 1-2 sessions; 8/15: initiated Goal status: INITIAL LONG TERM GOALS: Target date: 01/24/2022   Patient will increase FOTO score to equal to or greater than  63  to demonstrate statistically significant improvement in mobility and quality of life.  Baseline: 8/15: 47 Goal status: INITIAL  2.   Patient will increase 10 meter walk test to >1.27ms as to improve gait speed for better community ambulation and to reduce fall risk. Baseline: 0.83 m/s with QC Goal status: INITIAL  3.  Patient will increase BLE gross strength by 1/2 point on MMT as to improve functional strength for independent gait, increased standing tolerance and increased ADL ability. Baseline: grossly 4+/5 BLE, with exception of R ankle testing deferred at eval Goal status: INITIAL  4.   Patient will demonstrate an improved Berg Balance Score of at least 3 points or greater as to demonstrate improved balance with ADLs such as sitting/standing and transfer balance and reduced fall risk.  Baseline: 8/15: 51 Goal status: INITIAL  5.  Patient will increase lower extremity functional scale to >60/80 to demonstrate improved functional mobility and increased tolerance with ADLs.  Baseline: 44/80 Goal status: INITIAL     PLAN: PT FREQUENCY: 2x/week  PT DURATION: 12 weeks  PLANNED INTERVENTIONS: Therapeutic exercises, Therapeutic activity, Neuromuscular re-education, Balance training, Gait training, Patient/Family education, Self Care, Joint mobilization, Joint manipulation, Stair training, Vestibular training, Canalith  repositioning, Visual/preceptual remediation/compensation, Orthotic/Fit training, DME instructions, Electrical stimulation, Spinal mobilization, Cryotherapy, Moist heat, Splintting, Taping, Ultrasound, Manual therapy, and Re-evaluation  PLAN FOR NEXT SESSION:  balance and strength training, gentle mobility R ankle, isometrics, gait training, continue plan   HZollie Pee PT 11/20/2021, 10:02 AM

## 2021-11-22 ENCOUNTER — Ambulatory Visit: Payer: Medicare PPO

## 2021-11-22 ENCOUNTER — Encounter: Payer: Self-pay | Admitting: Occupational Therapy

## 2021-11-22 ENCOUNTER — Ambulatory Visit: Payer: Medicare PPO | Admitting: Occupational Therapy

## 2021-11-22 DIAGNOSIS — R278 Other lack of coordination: Secondary | ICD-10-CM

## 2021-11-22 DIAGNOSIS — R2681 Unsteadiness on feet: Secondary | ICD-10-CM

## 2021-11-22 DIAGNOSIS — M6281 Muscle weakness (generalized): Secondary | ICD-10-CM | POA: Diagnosis not present

## 2021-11-22 DIAGNOSIS — R262 Difficulty in walking, not elsewhere classified: Secondary | ICD-10-CM

## 2021-11-22 NOTE — Therapy (Signed)
OUTPATIENT OCCUPATIONAL THERAPY TREATMENT NOTE/PROGRESS UPDATE REPORTING PERIOD FROM 10/08/2021 TO 11/20/2021  Patient Name: Teresa [name] MRN: [id number] DOB:May 22, 1940, 81 y.o., female Today's Date: 11/20/2021  PCP: Dr. Fulton [name] REFERRING PROVIDER: Dr. Fulton [name]   OT End of Session - 11/21/21 1512     Visit Number 50    Number of Visits 72    Date for OT Re-Evaluation 01/31/22    OT Start Time 0930    OT Stop Time 1017    OT Time Calculation (min) 47 min    Activity Tolerance Patient tolerated treatment well    Behavior During Therapy Teresa [name] for tasks assessed/performed             Past Medical History:  Diagnosis Date   Cancer ([name])    skin   Hypothyroidism    Raynaud's disease    Past Surgical History:  Procedure Laterality Date   [place]   BACK SURGERY  2010   Cervical fusion C4-5-6-7   CATARACT EXTRACTION, BILATERAL Bilateral 10/2016   CHOLECYSTECTOMY  1995   COLONOSCOPY WITH PROPOFOL N/A 12/08/2018   Procedure: COLONOSCOPY WITH PROPOFOL;  Surgeon: Toledo, Benay Pike, MD;  Location: [facility] ENDOSCOPY;  Service: Gastroenterology;  Laterality: N/A;   EYE SURGERY     Patient Active Problem List   Diagnosis Date Noted   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  Following inpt rehab, pt went home and had home health.   PRECAUTIONS: fall  SUBJECTIVE:     Pt reports her husband put a sweet potato in  the oven and then went to the neighbor's house, pt went to take it out of the oven and hit her left hand on the oven rack and has a small burn on her middle finger between PIP and DIP joint.  Also a smaller area on index on PIP joint.    PAIN:  Are you having pain? Yes: NPRS scale: 0/10 Pain location:  R shoulder  Pain description: sore Aggravating factors: N/A Relieving factors: rest, heat, otc pain meds   OBJECTIVE:  TODAY'S TREATMENT:     Therapeutic Exercise:             Pt engaging in task with use of Regular clothespins to pinch and place onto long dowel.  She was able to perform 6 and then required rest break         and then completed 4 more  with increased time. Removed with lateral pinch with cues for technique.  Neuro re-ed:  Pt seen for attempts at manipulation of small Grooved pegs with pegboard to pick up and place into board, with increased effort and time for 5 to place.  Therapist placed the remaining grooved pegs and pt removed with cues to utilize and facilitate a tip to tip pinch pattern.  Removing with attempts at tip to tip pinch with difficulty, with the pegs being short, she tends to have difficulty with tip to  tip with enough surface area and switches to a lateral pinch. Manipulation of [place] discs, flipping and turning, stacking with left hand. Grasp and release to place back into bucket.  Pt requires increased time to complete this task, movement patterns are slow and deliberate with increased focus, overflow movements noted on right side with attempts with left.   PATIENT EDUCATION: Education details: L hand dexterity skills; s/s of CVA and when to call 911 or go to ED. Person educated: pt Education method: Explanation and Verbal cues Education comprehension: verbalized understanding   HOME EXERCISE PROGRAM Continue to engage LUE into ADLs; continue gripping and pinching exercises for LUE, and L shoulder AROM/AAROM, crochet        OT Long Term Goals -        OT LONG TERM GOAL #1   Title Pt will be independent with home exercise program.    Baseline Eval: no current program, 10th visit:  continue to add new exercises as pt progresses, 20th:  continue to update HEP; 08/17/21: continue to progress HEP when indicated.  Visit 40: adding new exercises as pt progresses   Time 12    Period Weeks    Status On-going    Target Date 01/31/22      OT LONG TERM GOAL #2   Title Pt will complete UB and LB dressing with modified independence including buttons, snaps and zippers.    Baseline requires min assist at eval, 10th visit: occasional assist with buttons, 20th:  able to perform one handed, but difficulty with bilateral UE; 08/17/21: pt reports inconsistent with 1 hand, reviewed techniques this visit, Visit 40:  Pt requires assist with bra   Time 12    Period Weeks    Status MET   Target Date 11/08/21     OT LONG TERM GOAL #3   Title Pt will perform shower transfer with modified independence.    Baseline Pt requires supervision to min assist for shower transfer at home. 10th visit: supervision; 08/17/21: supv .  Visit 40:  Pt had met goal but had recent fall and now requiring min guard to supv   Time 6    Period Weeks    Status  Met    Target Date 11/08/21      OT LONG TERM GOAL #4   Title Pt will improve L hand grip by 10# to assist with holding items in left hand securely.    Baseline no grip in left hand at eval, 10th visit:  improved flexion but still working towards composite fisting and grip. 20th:  continues to demo decreased grip; 08/17/21: active digit flexion improving, but not yet able to register grip on dynamometer; 09/04/21: L grip 1# 7/17:  5#    Time 12    Period Weeks    Status On-going    Target Date 01/31/22      OT LONG TERM GOAL #5   Title Pt will improve left shoulder flexion to 100 degrees or better to improve reaching to obtain self care items from shelf/shoulder height.    Baseline difficulty with reach, shoulder  flexion to 47 degrees; 08/17/21: L shoulder flexion 85, but not yet able to consistently hold ADL supplies in L hand when reaching; 09/04/21: flexion 85 7/17:  shoulder flexion to 90   Time 12    Period Weeks    Status On-going    Target Date 01/31/22      OT LONG TERM GOAL #6   Title Pt will  improve FOTO score to 47 or above to demonstrate a clinically relevant change in function to impact ADL tasks.    Baseline score of 30 at eval; 08/17/21: FOTO: 42; 09/04/21: FOTO : 54 , FOTO 7/7:  60   Time 12    Period Weeks    Status  MET    Target Date 01/31/22      OT LONG TERM GOAL #7   Title Pt will demonstrate ability to pick up small objects and complete 9 hole peg test in less than 2 mins.    Baseline unable to perform at eval, 10th visit: still unable to pick up small pegs, 20th: unable to pick up pegs but improving; 08/17/21: not attempted d/t time constraints, will assess next visit; 09/04/21: 9 hole peg test in 5 min 53 sec;  7/17:  Pt unable to complete this date but was able to place 6 of 9 pegs in 4 mins 20 secs   Time 12    Period Weeks    Status On-going    Target Date 01/31/2022             Plan -     Clinical Impression Statement Pt continues to improve with redevelopment of hand skills on the left.  She is nearing being able to complete a full fisting motion.  All tasks require increased focus and effort, continues to lack precision in movements but has progressed greatly over time.  She continues to work on tasks at home to utilize left hand in bilateral tasks such as attempts with crocheting but still has difficulty with mangaging the tension of the yarn.  Continue to work towards goals in plan of care to improve left UE ROM, functional use, coordination and precision movements to perform daily tasks. Pt continues to benefit from skilled OT services to maximize safety and independence in necessary daily tasks at home and in the community.    OT Occupational Profile and History  Detailed Assessment- Review of Records and additional review of physical, cognitive, psychosocial history related to current functional performance    Occupational performance deficits (Please refer to evaluation for details): ADL's;IADL's;Leisure;Rest and Sleep    Body Structure / Function / Physical Skills ADL;Coordination;Endurance;GMC;UE functional use;Balance;IADL;Pain;Dexterity;FMC;Strength;Edema;Mobility;ROM    Psychosocial Skills Environmental  Adaptations;Habits;Routines and Behaviors    Rehab Potential Good    Clinical Decision Making Several treatment options, min-mod task modification necessary    Comorbidities Affecting Occupational Performance: May have comorbidities impacting occupational performance    Modification or Assistance to Complete Evaluation  Min-Moderate modification of tasks or assist with assess necessary to complete eval    OT Frequency 2x / week    OT Duration 12 weeks    OT Treatment/Interventions Self-care/ADL training;Cryotherapy;Paraffin;Therapeutic exercise;DME and/or AE instruction;Functional Mobility Training;Balance training;Electrical Stimulation;Ultrasound;Neuromuscular education;Manual Therapy;Splinting;Moist Heat;Contrast Bath;Passive range of motion;Therapeutic activities;Patient/family education;Coping strategies training    Plan OT recert    Consulted and Agree with Plan of Care Patient            Briseyda Fehr Colette Ribas, OT 11/22/2021, 3:32 PM

## 2021-11-22 NOTE — Therapy (Signed)
OUTPATIENT PHYSICAL THERAPY LOWER EXTREMITY TREATMENT   Patient Name: Teresa [name] MRN: [id number] DOB:1940-09-01, 81 y.o., female Today's Date: 11/22/2021   PT End of Session - 11/22/21 1021     Visit Number 6    Number of Visits 25    Date for PT Re-Evaluation 01/24/22    PT Start Time 0931    PT Stop Time 1013    PT Time Calculation (min) 42 min    Equipment Utilized During Treatment Gait belt    Activity Tolerance Patient tolerated treatment well;No increased pain    Behavior During Therapy WFL for tasks assessed/performed                  Past Medical History:  Diagnosis Date   Cancer ([place])    skin   Hypothyroidism    Raynaud's disease    Past Surgical History:  Procedure Laterality Date   ABDOMINAL HYSTERECTOMY  1991   APPENDECTOMY  1991   BACK SURGERY  2010   Cervical fusion C4-5-6-7   CATARACT EXTRACTION, BILATERAL Bilateral 10/2016   CHOLECYSTECTOMY  1995   COLONOSCOPY WITH PROPOFOL N/A 12/08/2018   Procedure: COLONOSCOPY WITH PROPOFOL;  Surgeon: Toledo, Benay Pike, MD;  Location: [facility] ENDOSCOPY;  Service: Gastroenterology;  Laterality: N/A;   EYE SURGERY     Patient Active Problem List   Diagnosis Date Noted   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 Pt reports no stumbles or falls. Pt doing her HEP.   PAIN:  Are you having pain? Yes: NPRS scale: 0/10 Pain location: BUEs,  shoulders, neck Pain description: achey, cracking Aggravating factors: Shoulder flexion of BUEs Relieving factors: heat  PERTINENT HISTORY: On June 24th, 2023 pt fell pulling up her pajama pants at home, slipped, and fractured her R ankle (closed traumatic nondisplaced fracture of posterior malleolus of R tibia). Pt had been working with PT for R side MCA CVA (02/26/21). Pt now returns to PT. She is WBAT per chart (Dr. Doy Hutching). Pt uses a quad cane for ambulation. She is not wearing an ankle brace, but does report she wears a brace when out for longer periods of time (does not currently have it with her for exam). She reports she has difficulty with balance with turning and feels she is dragging her LLE. Pt reports she has no ankle pain. Feels her balance is generally off. She reports she has been moving her R ankle and walking on it for exercise. Pt is not aware of any other restrictions other than to wear her brace. Pt reports she was discharged from working with her physician for her ankle due to good progress.  Pt has not had any other falls since the one in June, just stumbling with turns. She continues to work with OT for her UE s/p CVA. PMH includes: skin cancer, hypothyroidism, and Raynaud's disease, history of cervical fusion in 2010 C4-C7, R side MCA CVA 02/26/21.  PRECAUTIONS: Fall  WEIGHT BEARING RESTRICTIONS  Yes WBAT, pt has R ankle brace but reports only wears for long-distance walking  FALLS:  Has patient fallen in last 6 months? Yes. Number of falls 1  LIVING ENVIRONMENT: Lives with: lives with their spouse Lives in: House/apartment Stairs: Yes: External: 1 steps; none Does have second floor, with handrail going up on L, but hasn't gone upstairs per report Has following equipment at home: Quad cane small base, Environmental consultant - 2 wheeled, Wheelchair (manual), and Grab bars   PLOF: Independent with household mobility with device and reports husband has helped with bathing since  stroke  PATIENT GOALS  Improve her balance with turning and not dragging her feet, "the left leg is dragging again"   OBJECTIVE:  Taken at eval unless indicated otherwise DIAGNOSTIC FINDINGS:  Information via chart - Scarlett Presto, PA 10/23/2021: "Xrays of the right ankle were ordered and interpreted 10/23/2021, with 3 views using AP, lateral, and mortise views. Xrays revealed the right ankle with a posterior malleolus nondisplaced fracture with no angulation or rotation. The ankle mortise is well-maintained. There is significant callus formation or fracture healing noted on today's x-rays."    PATIENT SURVEYS:  LEFS 44/80   FOTO: 47 (taken 11/06/2021)  COGNITION:  Overall cognitive status: Within functional limits for tasks assessed     SENSATION: WFL BLE, denies any n/t in BLE  EDEMA:  None observed currently, but does report swelling later in the day in her B ankles "just with the socks"  MUSCLE LENGTH: Hamstrings: Right -18 deg; Left -19 deg deg from neutral   POSTURE: rounded shoulders and increased thoracic kyphosis  PALPATION: Medial lower leg just superior to medial malleolus feels "bruised"  LOWER EXTREMITY ROM:  Active ROM Right eval Left eval  Ankle dorsiflexion 1 5  Ankle plantarflexion ~50 ~50   (Blank rows = not tested)  LOWER EXTREMITY MMT:   BLE grossly 4+/5 (*did not test R ankle musculature today)   FUNCTIONAL TESTS:  10 meter walk test: 0.83 m/s with QC  Berg: 51/56 taken 11/06/2021  TODAY'S TREATMENT: 11/22/21  Hamstring stretch 2x30 sec each LE   Ankle pump RLE 20x  Heel raises 20x B  Nustep HIIT lvl1 up to lvl5 in 60 sec intervals x 6 min total. Cuing for LLE placement. Pt uses RUE first two minutes of intervention. PT monitors pt throughout for response and adjusts intensity as appropriate for therapeutic effect.  Standing heel raises 20x 2 sets B Standing DF minimal ROM 20x Seated DF 20x with 3 sec holds, improved  AROM  Standing hip abd 2x15 each LE  STS 1x15, 1x10  NMR:  Long hallway: Ambulation reading sticky notes to incorporate primarily horizontal head turns with some vertical head turns - 2x rates easy.  -Attempted exercise with vertical head turns (not reading sticky notes) 2x- rates more challenging, decreased stability, close CGA  Obstacle course with half-foam obstacle,  and weaving through cones with dual motor task, and then motor and cognitive task x multiple reps. At end pt picks up cones LUE. Knocks into cones, some difficulty with obstacle clearance with dual task.    PATIENT EDUCATION:  Education details: exercise technique, body mechanics Person educated: Patient Education method: Explanation, Demonstration, Tactile cues, and Verbal cues Education comprehension: verbalized understanding and returned demonstration   HOME EXERCISE PROGRAM: no updates on this date, pt to continue HEP as previously given 8/15: Access Code: [email or web address] URL: [email or web address] Date: 11/06/2021 Prepared by: Ricard Dillon  Exercises - Supine Single Leg Ankle Pumps  -  1 x daily - 7 x weekly - 2 sets - 20 reps - Supine Active Straight Leg Raise  - 1 x daily - 5 x weekly - 2 sets - 15 reps  ASSESSMENT:  CLINICAL IMPRESSION: Pt making progress toward goals AEB performing more challenging balance interventions, and tolerating increased reps of  therEx without pain or significant fatigue. While pt shows progress, she still has some difficulty with dual tasking and obstacle negotiation, and difficulty with vertical head turns with gait. The pt would benefit from further skilled PT to address these deficits in order to increase ease and safety with ADLs and ambulation and to decrease fall risk.    OBJECTIVE IMPAIRMENTS Abnormal gait, decreased activity tolerance, decreased balance, decreased coordination, decreased endurance, decreased mobility, difficulty walking, decreased ROM, decreased  strength, hypomobility, increased edema, impaired flexibility, impaired UE functional use, improper body mechanics, postural dysfunction, and pain.   ACTIVITY LIMITATIONS carrying, lifting, standing, stairs, bed mobility, bathing, dressing, reach over head, hygiene/grooming, and locomotion level  PARTICIPATION LIMITATIONS: meal prep, cleaning, laundry, driving, shopping, community activity, and yard work  PERSONAL FACTORS Age, Past/current experiences, Sex, and 3+ comorbidities: hypothyroidism, and Raynaud's disease, history of cervical fusion in 2010 C4-C7, R side MCA CVA 02/26/21  are also affecting patient's functional outcome.   REHAB POTENTIAL: Good  CLINICAL DECISION MAKING: Stable/uncomplicated  EVALUATION COMPLEXITY: Moderate   GOALS: Goals reviewed with patient? Yes  SHORT TERM GOALS: Target date: 12/27/2021  Patient will be independent in home exercise program to improve strength/mobility for better functional independence with ADLs. Baseline: to be initiated next 1-2 sessions; 8/15: initiated Goal status: INITIAL LONG TERM GOALS: Target date: 01/24/2022   Patient will increase FOTO score to equal to or greater than  63  to demonstrate statistically significant improvement in mobility and quality of life.  Baseline: 8/15: 47 Goal status: INITIAL  2.   Patient will increase 10 meter walk test to >1.106ms as to improve gait speed for better community ambulation and to reduce fall risk. Baseline: 0.83 m/s with QC Goal status: INITIAL  3.  Patient will increase BLE gross strength by 1/2 point on MMT as to improve functional strength for independent gait, increased standing tolerance and increased ADL ability. Baseline: grossly 4+/5 BLE, with exception of R ankle testing deferred at eval Goal status: INITIAL  4.   Patient will demonstrate an improved Berg Balance Score of at least 3 points or greater as to demonstrate improved balance with ADLs such as sitting/standing and  transfer balance and reduced fall risk.  Baseline: 8/15: 51 Goal status: INITIAL  5.  Patient will increase lower extremity functional scale to >60/80 to demonstrate improved functional mobility and increased tolerance with ADLs.  Baseline: 44/80 Goal status: INITIAL     PLAN: PT FREQUENCY: 2x/week  PT DURATION: 12 weeks  PLANNED INTERVENTIONS: Therapeutic exercises, Therapeutic activity, Neuromuscular re-education, Balance training, Gait training, Patient/Family education, Self Care, Joint mobilization, Joint manipulation, Stair training, Vestibular training, Canalith repositioning, Visual/preceptual remediation/compensation, Orthotic/Fit training, DME instructions, Electrical stimulation, Spinal mobilization, Cryotherapy, Moist heat, Splintting, Taping, Ultrasound, Manual therapy, and Re-evaluation  PLAN FOR NEXT SESSION:  balance and strength training, gentle mobility R ankle, isometrics, gait training, continue plan   HZollie Pee PT 11/22/2021, 10:25 AM

## 2021-11-22 NOTE — Therapy (Signed)
OUTPATIENT OCCUPATIONAL THERAPY TREATMENT NOTE  Patient Name: Teresa [name] MRN: [id number] DOB:1940-07-11, 81 y.o., female Today's Date: 11/22/2021  PCP: Dr. [name] REFERRING PROVIDER: Dr. [name]   OT End of Session - 11/22/21 1534     Visit Number 51    Number of Visits 72    Date for OT Re-Evaluation 01/31/22    OT Start Time 0845    OT Stop Time 0930    OT Time Calculation (min) 45 min    Activity Tolerance Patient tolerated treatment well    Behavior During Therapy [facility] for tasks assessed/performed             Past Medical History:  Diagnosis Date   Cancer ([place])    skin   Hypothyroidism    Raynaud's disease    Past Surgical History:  Procedure Laterality Date   [place]   BACK SURGERY  2010   Cervical fusion C4-5-6-7   CATARACT EXTRACTION, BILATERAL Bilateral 10/2016   CHOLECYSTECTOMY  1995   COLONOSCOPY WITH PROPOFOL N/A 12/08/2018   Procedure: COLONOSCOPY WITH PROPOFOL;  Surgeon: Toledo, Benay Pike, MD;  Location: [facility] ENDOSCOPY;  Service: Gastroenterology;  Laterality: N/A;   EYE SURGERY     Patient Active Problem List   Diagnosis Date Noted   Right middle cerebral artery stroke ([place]) 03/01/2021   Stroke ([place]) 02/27/2021   History of nonmelanoma skin cancer 05/23/2014    ONSET DATE: 02/26/2021  REFERRING DIAG: R MCA CVA  THERAPY DIAG:  Other lack of coordination  Muscle weakness (generalized)  Unsteadiness on feet  Rationale for Evaluation and Treatment Rehabilitation  PERTINENT HISTORY: February 26, 2021, pt reports she had a CVA, came to [facility] to ER and then was transferred to [facility] in [place] where she was admitted and after acute care she went to inpatient rehab.  Following inpt rehab, pt went home and had home health.   PRECAUTIONS: fall  SUBJECTIVE:     No pain and fist has continued to improve , index and small finger touching but middle and ring about  in  from palm, with effort she is able to make a fist. "My shoulder popped and now it feels better"  PAIN:  Are you having pain? Yes: NPRS scale: 0/10 Pain location:  R shoulder  Pain description: sore Aggravating factors: N/A Relieving factors: rest, heat, otc pain meds   OBJECTIVE:  TODAY'S TREATMENT:     Therapeutic Exercise: Pt seen for AAROM exercises for left shoulder for shoulder flexion, ABD, ADD and overhead reach, performed in sitting with assistance from therapist to guide and support arm as well as moving towards available end ranges.                 Neuro re-ed: Pt seen for use of patterned blocks and boards, choosing pattern and reaching and picking up/manipulating a variety of shapes, pieces and sizes.  Pt placing onto board which has a design with definitive lines for shapes but does not have grooved template so she has to focus on precision of movements to place pieces in a space without disrupting the other adjacent pieces. Able to retrieve pieces this date from the box rather than having pieces placed on table.   Increased time allow to complete task.  Rest breaks as needed for hand, as she fatigues her motion and more precise movement patterns decline.    PATIENT EDUCATION: Education details: L hand dexterity skills; s/s  of CVA and when to call 911 or go to ED. Person educated: pt Education method: Explanation and Verbal cues Education comprehension: verbalized understanding   HOME EXERCISE PROGRAM Continue to engage LUE into ADLs; continue gripping and pinching exercises for LUE, and L shoulder AROM/AAROM, crochet        OT Long Term Goals -       OT LONG TERM GOAL #1   Title Pt will be independent with home exercise program.    Baseline Eval: no current program, 10th visit:  continue to add new exercises as pt progresses, 20th:  continue to update HEP; 08/17/21: continue to progress HEP when indicated.  Visit 40: adding new exercises as pt progresses   Time 12     Period Weeks    Status On-going    Target Date 01/31/22      OT LONG TERM GOAL #2   Title Pt will complete UB and LB dressing with modified independence including buttons, snaps and zippers.    Baseline requires min assist at eval, 10th visit: occasional assist with buttons, 20th:  able to perform one handed, but difficulty with bilateral UE; 08/17/21: pt reports inconsistent with 1 hand, reviewed techniques this visit, Visit 40:  Pt requires assist with bra   Time 12    Period Weeks    Status MET   Target Date 11/08/21     OT LONG TERM GOAL #3   Title Pt will perform shower transfer with modified independence.    Baseline Pt requires supervision to min assist for shower transfer at home. 10th visit: supervision; 08/17/21: supv .  Visit 40:  Pt had met goal but had recent fall and now requiring min guard to supv   Time 6    Period Weeks    Status  Met    Target Date 11/08/21      OT LONG TERM GOAL #4   Title Pt will improve L hand grip by 10# to assist with holding items in left hand securely.    Baseline no grip in left hand at eval, 10th visit:  improved flexion but still working towards composite fisting and grip. 20th:  continues to demo decreased grip; 08/17/21: active digit flexion improving, but not yet able to register grip on dynamometer; 09/04/21: L grip 1# 7/17:  5#    Time 12    Period Weeks    Status On-going    Target Date 01/31/22      OT LONG TERM GOAL #5   Title Pt will improve left shoulder flexion to 100 degrees or better to improve reaching to obtain self care items from shelf/shoulder height.    Baseline difficulty with reach, shoulder flexion to 47 degrees; 08/17/21: L shoulder flexion 85, but not yet able to consistently hold ADL supplies in L hand when reaching; 09/04/21: flexion 85 7/17:  shoulder flexion to 90   Time 12    Period Weeks    Status On-going    Target Date 01/31/22      OT LONG TERM GOAL #6   Title Pt will improve FOTO score to 47 or above to  demonstrate a clinically relevant change in function to impact ADL tasks.    Baseline score of 30 at eval; 08/17/21: FOTO: 42; 09/04/21: FOTO : 54 , FOTO 7/7:  60   Time 12    Period Weeks    Status  MET    Target Date 01/31/22      OT LONG TERM  GOAL #7   Title Pt will demonstrate ability to pick up small objects and complete 9 hole peg test in less than 2 mins.    Baseline unable to perform at eval, 10th visit: still unable to pick up small pegs, 20th: unable to pick up pegs but improving; 08/17/21: not attempted d/t time constraints, will assess next visit; 09/04/21: 9 hole peg test in 5 min 53 sec;  7/17:  Pt unable to complete this date but was able to place 6 of 9 pegs in 4 mins 20 secs   Time 12    Period Weeks    Status On-going    Target Date 01/31/2022             Plan -     Clinical Impression Statement Pt performing well this date, continues to demonstrate limitations in left shoulder with ROM, shoulder flexion to 115 this date.  Pt continues to lack more precise fine motor coordination and manipulation patterns for objects especially with items less than 1 inch in size.  She was able to retrieve items from the container this date with increased effort rather than requiring therapist to hand her the items or place on the table.  She was able to demonstrate a full flat fisting pattern, still working on composite fisting and DIP flexion.  Continue to work towards goals in plan of care to improve left UE function for necessary daily tasks.    OT Occupational Profile and History Detailed Assessment- Review of Records and additional review of physical, cognitive, psychosocial history related to current functional performance    Occupational performance deficits (Please refer to evaluation for details): ADL's;IADL's;Leisure;Rest and Sleep    Body Structure / Function / Physical Skills ADL;Coordination;Endurance;GMC;UE functional  use;Balance;IADL;Pain;Dexterity;FMC;Strength;Edema;Mobility;ROM    Psychosocial Skills Environmental  Adaptations;Habits;Routines and Behaviors    Rehab Potential Good    Clinical Decision Making Several treatment options, min-mod task modification necessary   Comorbidities Affecting Occupational Performance: May have comorbidities impacting occupational performance    Modification or Assistance to Complete Evaluation  Min-Moderate modification of tasks or assist with assess necessary to complete eval    OT Frequency 2x / week    OT Duration 12 weeks    OT Treatment/Interventions Self-care/ADL training;Cryotherapy;Paraffin;Therapeutic exercise;DME and/or AE instruction;Functional Mobility Training;Balance training;Electrical Stimulation;Ultrasound;Neuromuscular education;Manual Therapy;Splinting;Moist Heat;Contrast Bath;Passive range of motion;Therapeutic activities;Patient/family education;Coping strategies training    Plan OT recert    Consulted and Agree with Plan of Care Patient            Shah Insley Colette Ribas, OT 11/22/2021, 3:36 PM

## 2021-11-27 ENCOUNTER — Ambulatory Visit: Payer: Medicare PPO

## 2021-11-27 ENCOUNTER — Ambulatory Visit: Payer: Medicare PPO | Attending: Internal Medicine

## 2021-11-27 DIAGNOSIS — R278 Other lack of coordination: Secondary | ICD-10-CM | POA: Diagnosis present

## 2021-11-27 DIAGNOSIS — M6281 Muscle weakness (generalized): Secondary | ICD-10-CM | POA: Diagnosis present

## 2021-11-27 DIAGNOSIS — R262 Difficulty in walking, not elsewhere classified: Secondary | ICD-10-CM | POA: Insufficient documentation

## 2021-11-27 DIAGNOSIS — R2681 Unsteadiness on feet: Secondary | ICD-10-CM | POA: Diagnosis present

## 2021-11-27 DIAGNOSIS — M5459 Other low back pain: Secondary | ICD-10-CM | POA: Diagnosis present

## 2021-11-27 DIAGNOSIS — M25512 Pain in left shoulder: Secondary | ICD-10-CM | POA: Diagnosis present

## 2021-11-27 DIAGNOSIS — G8929 Other chronic pain: Secondary | ICD-10-CM | POA: Insufficient documentation

## 2021-11-27 DIAGNOSIS — I63511 Cerebral infarction due to unspecified occlusion or stenosis of right middle cerebral artery: Secondary | ICD-10-CM | POA: Diagnosis present

## 2021-11-27 NOTE — Therapy (Signed)
OUTPATIENT OCCUPATIONAL THERAPY TREATMENT NOTE  Patient Name: Teresa [name] MRN: [id number] DOB:15-Nov-1940, 81 y.o., female Today's Date: 11/22/2021  PCP: Dr. [name] REFERRING PROVIDER: Dr. [name]   OT End of Session - 11/27/21 1333     Visit Number 52    Number of Visits 72    Date for OT Re-Evaluation 01/31/22    OT Start Time 0100    OT Stop Time 1230    OT Time Calculation (min) 45 min    Activity Tolerance Patient tolerated treatment well    Behavior During Therapy [facility] for tasks assessed/performed             Past Medical History:  Diagnosis Date   Cancer ([place])    skin   Hypothyroidism    Raynaud's disease    Past Surgical History:  Procedure Laterality Date   [place]   BACK SURGERY  2010   Cervical fusion C4-5-6-7   CATARACT EXTRACTION, BILATERAL Bilateral 10/2016   CHOLECYSTECTOMY  1995   COLONOSCOPY WITH PROPOFOL N/A 12/08/2018   Procedure: COLONOSCOPY WITH PROPOFOL;  Surgeon: Toledo, Benay Pike, MD;  Location: [facility] ENDOSCOPY;  Service: Gastroenterology;  Laterality: N/A;   EYE SURGERY     Patient Active Problem List   Diagnosis Date Noted   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 to ER and then was transferred to [facility] in [place] where she was admitted and after acute care she went to inpatient rehab.  Following inpt rehab, pt went home and had home health.   PRECAUTIONS: fall  SUBJECTIVE:     Pt reports she dropped a coffee cup this morning trying to carry it with her L hand.  Pt  states she picked it up without thinking about it.   PAIN:  Are you having pain? Yes: NPRS scale: 0/10 Pain location:  R shoulder  Pain description: sore Aggravating factors: N/A Relieving factors: rest, heat, otc pain meds   OBJECTIVE:  TODAY'S TREATMENT:     Therapeutic Exercise: Performed passive stretching for L shoulder flex/abd/horiz abd/ER, working to increase shoulder mobility for ADLs.  Facilitated LUE functional reaching with pt reaching into forward flex, abd, and horiz abd to reach for peg from therapist's hand at and slightly above shoulder level.  Worked with jumbo pegs and hand gripper to facilitate grip strengthening; Used all but black pegs for first trial on 6.6# grip setting with mod A; performed a 2nd and 3rd trial with tall pegs only (orange and green) and performed with min A, cues for sequencing and sustaining grip longer to avoid dropping pegs before they reached the container.     Neuro re-ed: Facilitated L hand dexterity skills, working to place jumbo pegs into pegboard, picking up pegs from container and table top.  Pt struggled to remove pegs from groove of container, but otherwise did well to complete, dropping only 3-4 from the entire container.   Self Care: Practiced item transport in L hand.  Pt transported plastic plate from table top and carried across the room to another table, requiring  min vc to keep plate level.  Practiced again with items on plate to simulate keeping food level on plate, min vc but no dropping.  Pt carried large cup with handle and lid in the L hand, mod vc for keeping cup level in L hand.  Pt able to make corrections when keeping her eyes on the cup, but cup titled down when eyes were off the cup.    PATIENT EDUCATION: Education details: L hand dexterity skills; s/s of CVA and when to call 911 or go to ED. Person educated: pt Education method: Explanation and Verbal cues Education comprehension: verbalized understanding   HOME  EXERCISE PROGRAM Continue to engage LUE into ADLs; continue gripping and pinching exercises for LUE, and L shoulder AROM/AAROM, crochet        OT Long Term Goals -       OT LONG TERM GOAL #1   Title Pt will be independent with home exercise program.    Baseline Eval: no current program, 10th visit:  continue to add new exercises as pt progresses, 20th:  continue to update HEP; 08/17/21: continue to progress HEP when indicated.  Visit 40: adding new exercises as pt progresses   Time 12    Period Weeks    Status On-going    Target Date 01/31/22      OT LONG TERM GOAL #2   Title Pt will complete UB and LB dressing with modified independence including buttons, snaps and zippers.    Baseline requires min assist at eval, 10th visit: occasional assist with buttons, 20th:  able to perform one handed, but difficulty with bilateral UE; 08/17/21: pt reports inconsistent with 1 hand, reviewed techniques this visit, Visit 40:  Pt requires assist with bra   Time 12    Period Weeks    Status MET   Target Date 11/08/21     OT LONG TERM GOAL #3   Title Pt will perform shower transfer with modified independence.    Baseline Pt requires supervision to min assist for shower transfer at home. 10th visit: supervision; 08/17/21: supv .  Visit 40:  Pt had met goal but had recent fall and now requiring min guard to supv   Time 6    Period Weeks    Status  Met    Target Date 11/08/21      OT LONG TERM GOAL #4   Title Pt will improve L hand grip by 10# to assist with holding items in left hand securely.    Baseline no grip in left hand at eval, 10th visit:  improved flexion but still working towards composite fisting and grip. 20th:  continues to demo decreased grip; 08/17/21: active digit flexion improving, but not yet able to register grip on dynamometer; 09/04/21: L grip 1# 7/17:  5#    Time 12    Period Weeks    Status On-going    Target Date 01/31/22      OT LONG TERM GOAL #5   Title Pt will improve  left shoulder flexion to 100 degrees or better to improve reaching to obtain self care items from shelf/shoulder height.    Baseline difficulty with reach, shoulder flexion to 47 degrees; 08/17/21: L shoulder flexion 85, but not yet able to consistently hold ADL supplies in L hand when reaching; 09/04/21: flexion 85 7/17:  shoulder flexion to 90   Time 12    Period Weeks    Status On-going    Target Date 01/31/22  OT LONG TERM GOAL #6   Title Pt will improve FOTO score to 47 or above to demonstrate a clinically relevant change in function to impact ADL tasks.    Baseline score of 30 at eval; 08/17/21: FOTO: 42; 09/04/21: FOTO : 54 , FOTO 7/7:  60   Time 12    Period Weeks    Status  MET    Target Date 01/31/22      OT LONG TERM GOAL #7   Title Pt will demonstrate ability to pick up small objects and complete 9 hole peg test in less than 2 mins.    Baseline unable to perform at eval, 10th visit: still unable to pick up small pegs, 20th: unable to pick up pegs but improving; 08/17/21: not attempted d/t time constraints, will assess next visit; 09/04/21: 9 hole peg test in 5 min 53 sec;  7/17:  Pt unable to complete this date but was able to place 6 of 9 pegs in 4 mins 20 secs   Time 12    Period Weeks    Status On-going    Target Date 01/31/2022             Plan -     Clinical Impression Statement Pt practiced retrieving jumbo pegs from the container this date and only had difficulty if the peg got stuck in the groove on the edge of the container.  Pt was also able to pick up the pegs from table top without a non-skid surface beneath.  Pt reported that she dropped a coffee cup this morning trying to carry it with her L hand.  Pt states she picked it up without thinking about it.  Practiced item transport this date with L hand, including plastic plate and large cup with lid and handle.  Pt required frequent cues for maintaining cup and plate level in hand.  OT encouraged pt to continue to  make attempts at carrying items in L hand with light items which can not be broken if dropped, or items that will not spill.  OT encouraged pt try coffee cup with lid and handle (non-breakable).  Pt receptive to recommendations.  Continue to work towards goals in plan of care to improve left UE function for necessary daily tasks.    OT Occupational Profile and History Detailed Assessment- Review of Records and additional review of physical, cognitive, psychosocial history related to current functional performance    Occupational performance deficits (Please refer to evaluation for details): ADL's;IADL's;Leisure;Rest and Sleep    Body Structure / Function / Physical Skills ADL;Coordination;Endurance;GMC;UE functional use;Balance;IADL;Pain;Dexterity;FMC;Strength;Edema;Mobility;ROM    Psychosocial Skills Environmental  Adaptations;Habits;Routines and Behaviors    Rehab Potential Good    Clinical Decision Making Several treatment options, min-mod task modification necessary   Comorbidities Affecting Occupational Performance: May have comorbidities impacting occupational performance    Modification or Assistance to Complete Evaluation  Min-Moderate modification of tasks or assist with assess necessary to complete eval    OT Frequency 2x / week    OT Duration 12 weeks    OT Treatment/Interventions Self-care/ADL training;Cryotherapy;Paraffin;Therapeutic exercise;DME and/or AE instruction;Functional Mobility Training;Balance training;Electrical Stimulation;Ultrasound;Neuromuscular education;Manual Therapy;Splinting;Moist Heat;Contrast Bath;Passive range of motion;Therapeutic activities;Patient/family education;Coping strategies training    Plan OT recert    Consulted and Agree with Plan of Care Patient              Leta Speller, MS, OTR/L   Darleene Cleaver, OT 11/27/2021, 1:35 PM

## 2021-11-27 NOTE — Therapy (Signed)
OUTPATIENT PHYSICAL THERAPY LOWER EXTREMITY TREATMENT   Patient Name: Teresa [name] MRN: [id number] DOB:18-Jul-1940, 81 y.o., female Today's Date: 11/27/2021   PT End of Session - 11/27/21 1105     Visit Number 7    Number of Visits 25    Date for PT Re-Evaluation 01/24/22    PT Start Time 1103    PT Stop Time 1146    PT Time Calculation (min) 43 min    Equipment Utilized During Treatment Gait belt    Activity Tolerance Patient tolerated treatment well;No increased pain    Behavior During Therapy WFL for tasks assessed/performed                   Past Medical History:  Diagnosis Date   Cancer ([place])    skin   Hypothyroidism    Raynaud's disease    Past Surgical History:  Procedure Laterality Date   ABDOMINAL HYSTERECTOMY  1991   APPENDECTOMY  1991   BACK SURGERY  2010   Cervical fusion C4-5-6-7   CATARACT EXTRACTION, BILATERAL Bilateral 10/2016   CHOLECYSTECTOMY  1995   COLONOSCOPY WITH PROPOFOL N/A 12/08/2018   Procedure: COLONOSCOPY WITH PROPOFOL;  Surgeon: Toledo, Benay Pike, MD;  Location: [facility] ENDOSCOPY;  Service: Gastroenterology;  Laterality: N/A;   EYE SURGERY     Patient Active Problem List   Diagnosis Date Noted   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 She has been ambulating without her ASO at home without issue. Pt reports no pain currently, no stumbles/falls.    PAIN:  Are you having pain? Yes: NPRS scale:  0/10 Pain location: BUEs, shoulders, neck Pain description: achey, cracking Aggravating factors: Shoulder flexion of BUEs Relieving factors: heat  PERTINENT HISTORY: On June 24th, 2023 pt fell pulling up her pajama pants at home, slipped, and fractured her R ankle (closed traumatic nondisplaced fracture of posterior malleolus of R tibia). Pt had been working with PT for R side MCA CVA (02/26/21). Pt now returns to PT. She is WBAT per chart (Dr. Doy Hutching). Pt uses a quad cane for ambulation. She is not wearing an ankle brace, but does report she wears a brace when out for longer periods of time (does not currently have it with her for exam). She reports she has difficulty with balance with turning and feels she is dragging her LLE. Pt reports she has no ankle pain. Feels her balance is generally off. She reports she has been moving her R ankle and walking on it for exercise. Pt is not aware of any other restrictions other than to wear her brace. Pt reports she was discharged from working with her physician for her ankle due to good progress.  Pt has not had any other falls since the one in June, just stumbling with turns. She continues to work with OT for her UE s/p CVA. PMH includes: skin cancer, hypothyroidism, and Raynaud's disease, history of cervical fusion in 2010 C4-C7, R side MCA CVA 02/26/21.  PRECAUTIONS: Fall  WEIGHT BEARING RESTRICTIONS Yes WBAT, pt has R ankle brace but reports only wears for long-distance walking  FALLS:  Has patient fallen in last 6 months? Yes. Number of falls 1  LIVING ENVIRONMENT: Lives with: lives with their spouse Lives in: House/apartment Stairs: Yes: External: 1 steps; none Does have second floor, with handrail going up on L, but hasn't gone upstairs per report Has following equipment at home: Quad cane small base, Environmental consultant - 2 wheeled, Wheelchair (manual), and Grab bars   PLOF: Independent with household mobility with device and reports husband has helped with  bathing since stroke  PATIENT GOALS  Improve her balance with turning and not dragging her feet, "the left leg is dragging again"   OBJECTIVE:  Taken at eval unless indicated otherwise DIAGNOSTIC FINDINGS:  Information via chart - Scarlett Presto, PA 10/23/2021: "Xrays of the right ankle were ordered and interpreted 10/23/2021, with 3 views using AP, lateral, and mortise views. Xrays revealed the right ankle with a posterior malleolus nondisplaced fracture with no angulation or rotation. The ankle mortise is well-maintained. There is significant callus formation or fracture healing noted on today's x-rays."    PATIENT SURVEYS:  LEFS 44/80   FOTO: 47 (taken 11/06/2021)  COGNITION:  Overall cognitive status: Within functional limits for tasks assessed     SENSATION: WFL BLE, denies any n/t in BLE  EDEMA:  None observed currently, but does report swelling later in the day in her B ankles "just with the socks"  MUSCLE LENGTH: Hamstrings: Right -18 deg; Left -19 deg deg from neutral   POSTURE: rounded shoulders and increased thoracic kyphosis  PALPATION: Medial lower leg just superior to medial malleolus feels "bruised"  LOWER EXTREMITY ROM:  Active ROM Right eval Left eval  Ankle dorsiflexion 1 5  Ankle plantarflexion ~50 ~50   (Blank rows = not tested)  LOWER EXTREMITY MMT:   BLE grossly 4+/5 (*did not test R ankle musculature today)   FUNCTIONAL TESTS:  10 meter walk test: 0.83 m/s with QC  Berg: 51/56 taken 11/06/2021  TODAY'S TREATMENT: 11/27/21   NMR: Ambulation outdoors: grass, cement, pine needles, up and down/slopes around curves and up and down a curb. With and without SPC and with and without dual cog task. Cued throughout to look around to incorporate horiz and vertical head movements for additional challenge x 23 minutes.  Obstacle course with half-foam obstacle,  and weaving through cones with dual motor task, and then motor and cognitive task, with  and without ASO x multiple reps. At end pt picks up cones LUE. Knocks into cones 3x   Support surface- 2x30 sec of each on each LE: SLB Tandem stance  Airex NBOS EC 60 sec  Dons/Doffs ASO for pt   PATIENT EDUCATION:  Education details: exercise technique, body mechanics Person educated: Patient Education method: Explanation, Demonstration, Tactile cues, and Verbal cues Education comprehension: verbalized understanding and returned demonstration   HOME EXERCISE PROGRAM: no updates on this date, pt to continue HEP as previously given 8/15: Access Code: [email or web address] URL: [email or web address] Date: 11/06/2021 Prepared by: Ricard Dillon  Exercises - Supine Single Leg Ankle Pumps  - 1 x daily - 7 x weekly - 2 sets - 20 reps - Supine Active Straight Leg Raise  - 1 x daily - 5 x weekly - 2 sets - 15 reps  ASSESSMENT:  CLINICAL IMPRESSION: Pt able to ambulate outdoors today with and without SPC and with CGA. She demonstrated good balance ambulating up and down  slopes, over compliant and firm surfaces and with addition of cog dual task. Pt with one instance of decreased postural stability ambulating without SPC over grass. However, pt was able to self-correct. The pt would benefit from further skilled PT to address these deficits in order to increase ease and safety with ADLs and ambulation and to decrease fall risk.    OBJECTIVE IMPAIRMENTS Abnormal gait, decreased activity tolerance, decreased balance, decreased coordination, decreased endurance, decreased mobility, difficulty walking, decreased ROM, decreased strength, hypomobility, increased edema, impaired flexibility, impaired UE functional use, improper body mechanics, postural dysfunction, and pain.   ACTIVITY LIMITATIONS carrying, lifting, standing, stairs, bed mobility, bathing, dressing, reach over head, hygiene/grooming, and locomotion level  PARTICIPATION LIMITATIONS: meal prep, cleaning, laundry, driving, shopping,  community activity, and yard work  PERSONAL FACTORS Age, Past/current experiences, Sex, and 3+ comorbidities: hypothyroidism, and Raynaud's disease, history of cervical fusion in 2010 C4-C7, R side MCA CVA 02/26/21  are also affecting patient's functional outcome.   REHAB POTENTIAL: Good  CLINICAL DECISION MAKING: Stable/uncomplicated  EVALUATION COMPLEXITY: Moderate   GOALS: Goals reviewed with patient? Yes  SHORT TERM GOALS: Target date: 12/27/2021  Patient will be independent in home exercise program to improve strength/mobility for better functional independence with ADLs. Baseline: to be initiated next 1-2 sessions; 8/15: initiated Goal status: INITIAL LONG TERM GOALS: Target date: 01/24/2022   Patient will increase FOTO score to equal to or greater than  63  to demonstrate statistically significant improvement in mobility and quality of life.  Baseline: 8/15: 47 Goal status: INITIAL  2.   Patient will increase 10 meter walk test to >1.89ms as to improve gait speed for better community ambulation and to reduce fall risk. Baseline: 0.83 m/s with QC Goal status: INITIAL  3.  Patient will increase BLE gross strength by 1/2 point on MMT as to improve functional strength for independent gait, increased standing tolerance and increased ADL ability. Baseline: grossly 4+/5 BLE, with exception of R ankle testing deferred at eval Goal status: INITIAL  4.   Patient will demonstrate an improved Berg Balance Score of at least 3 points or greater as to demonstrate improved balance with ADLs such as sitting/standing and transfer balance and reduced fall risk.  Baseline: 8/15: 51 Goal status: INITIAL  5.  Patient will increase lower extremity functional scale to >60/80 to demonstrate improved functional mobility and increased tolerance with ADLs.  Baseline: 44/80 Goal status: INITIAL     PLAN: PT FREQUENCY: 2x/week  PT DURATION: 12 weeks  PLANNED INTERVENTIONS: Therapeutic  exercises, Therapeutic activity, Neuromuscular re-education, Balance training, Gait training, Patient/Family education, Self Care, Joint mobilization, Joint manipulation, Stair training, Vestibular training, Canalith repositioning, Visual/preceptual remediation/compensation, Orthotic/Fit training, DME instructions, Electrical stimulation, Spinal mobilization, Cryotherapy, Moist heat, Splintting, Taping, Ultrasound, Manual therapy, and Re-evaluation  PLAN FOR NEXT SESSION:  balance and strength training, gentle mobility R ankle, isometrics, gait training, continue plan   HZollie Pee PT 11/27/2021, 12:32 PM

## 2021-11-29 ENCOUNTER — Ambulatory Visit: Payer: Medicare PPO

## 2021-11-29 DIAGNOSIS — M6281 Muscle weakness (generalized): Secondary | ICD-10-CM

## 2021-11-29 DIAGNOSIS — R278 Other lack of coordination: Secondary | ICD-10-CM

## 2021-11-29 DIAGNOSIS — I63511 Cerebral infarction due to unspecified occlusion or stenosis of right middle cerebral artery: Secondary | ICD-10-CM

## 2021-11-29 DIAGNOSIS — R2681 Unsteadiness on feet: Secondary | ICD-10-CM

## 2021-11-29 NOTE — Therapy (Signed)
OUTPATIENT PHYSICAL THERAPY LOWER EXTREMITY TREATMENT   Patient Name: Teresa [name] MRN: [id number] DOB:01/18/1941, 81 y.o., female Today's Date: 11/29/2021   PT End of Session - 11/29/21 1058     Visit Number 8    Number of Visits 25    Date for PT Re-Evaluation 01/24/22    PT Start Time 1017    PT Stop Time 1059    PT Time Calculation (min) 42 min    Equipment Utilized During Treatment Gait belt    Activity Tolerance Patient tolerated treatment well;No increased pain    Behavior During Therapy WFL for tasks assessed/performed                    Past Medical History:  Diagnosis Date   Cancer ([place])    skin   Hypothyroidism    Raynaud's disease    Past Surgical History:  Procedure Laterality Date   ABDOMINAL HYSTERECTOMY  1991   APPENDECTOMY  1991   BACK SURGERY  2010   Cervical fusion C4-5-6-7   CATARACT EXTRACTION, BILATERAL Bilateral 10/2016   CHOLECYSTECTOMY  1995   COLONOSCOPY WITH PROPOFOL N/A 12/08/2018   Procedure: COLONOSCOPY WITH PROPOFOL;  Surgeon: Toledo, Benay Pike, MD;  Location: [facility] ENDOSCOPY;  Service: Gastroenterology;  Laterality: N/A;   EYE SURGERY     Patient Active Problem List   Diagnosis Date Noted   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 She reports no stumbles/falls. She did sleep on her side last night and woke up and couldn't move her shoulders, did some exercises and has since improved.    PAIN:  Are you having pain? Yes: NPRS  scale: 3/10 Pain location: B shoulders Pain description: achey Aggravating factors: Sleeping on it Relieving factors: doing her exercises  PERTINENT HISTORY: On June 24th, 2023 pt fell pulling up her pajama pants at home, slipped, and fractured her R ankle (closed traumatic nondisplaced fracture of posterior malleolus of R tibia). Pt had been working with PT for R side MCA CVA (02/26/21). Pt now returns to PT. She is WBAT per chart (Dr. Doy Hutching). Pt uses a quad cane for ambulation. She is not wearing an ankle brace, but does report she wears a brace when out for longer periods of time (does not currently have it with her for exam). She reports she has difficulty with balance with turning and feels she is dragging her LLE. Pt reports she has no ankle pain. Feels her balance is generally off. She reports she has been moving her R ankle and walking on it for exercise. Pt is not aware of any other restrictions other than to wear her brace. Pt reports she was discharged from working with her physician for her ankle due to good progress.  Pt has not had any other falls since the one in June, just stumbling with turns. She continues to work with OT for her UE s/p CVA. PMH includes: skin cancer, hypothyroidism, and Raynaud's disease, history of cervical fusion in 2010 C4-C7, R  side MCA CVA 02/26/21.  PRECAUTIONS: Fall  WEIGHT BEARING RESTRICTIONS Yes WBAT, pt has R ankle brace but reports only wears for long-distance walking  FALLS:  Has patient fallen in last 6 months? Yes. Number of falls 1  LIVING ENVIRONMENT: Lives with: lives with their spouse Lives in: House/apartment Stairs: Yes: External: 1 steps; none Does have second floor, with handrail going up on L, but hasn't gone upstairs per report Has following equipment at home: Quad cane small base, Environmental consultant - 2 wheeled, Wheelchair (manual), and Grab bars   PLOF: Independent with household mobility with device and reports husband has helped with bathing  since stroke  PATIENT GOALS  Improve her balance with turning and not dragging her feet, "the left leg is dragging again"   OBJECTIVE:  Taken at eval unless indicated otherwise DIAGNOSTIC FINDINGS:  Information via chart - Scarlett Presto, PA 10/23/2021: "Xrays of the right ankle were ordered and interpreted 10/23/2021, with 3 views using AP, lateral, and mortise views. Xrays revealed the right ankle with a posterior malleolus nondisplaced fracture with no angulation or rotation. The ankle mortise is well-maintained. There is significant callus formation or fracture healing noted on today's x-rays."    PATIENT SURVEYS:  LEFS 44/80   FOTO: 47 (taken 11/06/2021)  COGNITION:  Overall cognitive status: Within functional limits for tasks assessed     SENSATION: WFL BLE, denies any n/t in BLE  EDEMA:  None observed currently, but does report swelling later in the day in her B ankles "just with the socks"  MUSCLE LENGTH: Hamstrings: Right -18 deg; Left -19 deg deg from neutral   POSTURE: rounded shoulders and increased thoracic kyphosis  PALPATION: Medial lower leg just superior to medial malleolus feels "bruised"  LOWER EXTREMITY ROM:  Active ROM Right eval Left eval  Ankle dorsiflexion 1 5  Ankle plantarflexion ~50 ~50   (Blank rows = not tested)  LOWER EXTREMITY MMT:   BLE grossly 4+/5 (*did not test R ankle musculature today)   FUNCTIONAL TESTS:  10 meter walk test: 0.83 m/s with QC  Berg: 51/56 taken 11/06/2021  TODAY'S TREATMENT: 11/29/21   NMR: Ambulation outdoors: grass, cement, pine needles, up and down/slopes around curves and up and down a curb. With and without SPC and with and without ASO brace. No pain throughout. Observed intermittent L foot/toe drag.  Cued for head turns throughout to increase challenge x 25 minutes  Support surface- SLB 2x30 sec. Intermittent UE support   PT assists pt in doffing AFO  TherEx:  DF seated 1x20 with 2 sec  holds Standing DF 20x B, and 20x individually  Standing PF 2x20. First set easy, second set fatiguing. Standing hip abduction  2x20 each  STS 11x   Instructed pt to donn ASO again once home.    PATIENT EDUCATION:  Education details: exercise technique, body mechanics Person educated: Patient Education method: Explanation, Demonstration, Tactile cues, and Verbal cues Education comprehension: verbalized understanding and returned demonstration   HOME EXERCISE PROGRAM: no updates on this date, pt to continue HEP as previously given 8/15: Access Code: [email or web address] URL: [email or web address] Date: 11/06/2021 Prepared by: Ricard Dillon  Exercises - Supine Single Leg Ankle Pumps  - 1 x daily - 7 x weekly - 2 sets - 20 reps - Supine Active Straight Leg Raise  - 1 x daily - 5 x weekly - 2 sets - 15 reps  ASSESSMENT:  CLINICAL IMPRESSION: Pt continued to practice ambulating outdoors over various surfaces and up and  down slopes today. She performed this intervention primarily without her SPC, and performed also without ASO only over firm surfaces. She was able to complete without LOB, but did exhibit L toe drag throughout (pt was able to occ correct with cues). Pt had no pain with intervention, but was still fatigued.The pt would benefit from further skilled PT to address these deficits in order to increase ease and safety with ADLs and ambulation and to decrease fall risk.    OBJECTIVE IMPAIRMENTS Abnormal gait, decreased activity tolerance, decreased balance, decreased coordination, decreased endurance, decreased mobility, difficulty walking, decreased ROM, decreased strength, hypomobility, increased edema, impaired flexibility, impaired UE functional use, improper body mechanics, postural dysfunction, and pain.   ACTIVITY LIMITATIONS carrying, lifting, standing, stairs, bed mobility, bathing, dressing, reach over head, hygiene/grooming, and locomotion level  PARTICIPATION  LIMITATIONS: meal prep, cleaning, laundry, driving, shopping, community activity, and yard work  PERSONAL FACTORS Age, Past/current experiences, Sex, and 3+ comorbidities: hypothyroidism, and Raynaud's disease, history of cervical fusion in 2010 C4-C7, R side MCA CVA 02/26/21  are also affecting patient's functional outcome.   REHAB POTENTIAL: Good  CLINICAL DECISION MAKING: Stable/uncomplicated  EVALUATION COMPLEXITY: Moderate   GOALS: Goals reviewed with patient? Yes  SHORT TERM GOALS: Target date: 12/27/2021  Patient will be independent in home exercise program to improve strength/mobility for better functional independence with ADLs. Baseline: to be initiated next 1-2 sessions; 8/15: initiated Goal status: INITIAL LONG TERM GOALS: Target date: 01/24/2022   Patient will increase FOTO score to equal to or greater than  63  to demonstrate statistically significant improvement in mobility and quality of life.  Baseline: 8/15: 47 Goal status: INITIAL  2.   Patient will increase 10 meter walk test to >1.20ms as to improve gait speed for better community ambulation and to reduce fall risk. Baseline: 0.83 m/s with QC Goal status: INITIAL  3.  Patient will increase BLE gross strength by 1/2 point on MMT as to improve functional strength for independent gait, increased standing tolerance and increased ADL ability. Baseline: grossly 4+/5 BLE, with exception of R ankle testing deferred at eval Goal status: INITIAL  4.   Patient will demonstrate an improved Berg Balance Score of at least 3 points or greater as to demonstrate improved balance with ADLs such as sitting/standing and transfer balance and reduced fall risk.  Baseline: 8/15: 51 Goal status: INITIAL  5.  Patient will increase lower extremity functional scale to >60/80 to demonstrate improved functional mobility and increased tolerance with ADLs.  Baseline: 44/80 Goal status: INITIAL     PLAN: PT FREQUENCY: 2x/week  PT  DURATION: 12 weeks  PLANNED INTERVENTIONS: Therapeutic exercises, Therapeutic activity, Neuromuscular re-education, Balance training, Gait training, Patient/Family education, Self Care, Joint mobilization, Joint manipulation, Stair training, Vestibular training, Canalith repositioning, Visual/preceptual remediation/compensation, Orthotic/Fit training, DME instructions, Electrical stimulation, Spinal mobilization, Cryotherapy, Moist heat, Splintting, Taping, Ultrasound, Manual therapy, and Re-evaluation  PLAN FOR NEXT SESSION:  balance and strength training, gentle mobility R ankle, isometrics, gait training, continue plan   HZollie Pee PT 11/29/2021, 3:03 PM

## 2021-11-29 NOTE — Progress Notes (Signed)
Carelink Summary Report / Loop Recorder 

## 2021-11-30 NOTE — Therapy (Signed)
OUTPATIENT OCCUPATIONAL THERAPY TREATMENT NOTE  Patient Name: Teresa [name] MRN: [id number] DOB:11/01/1940, 81 y.o., female Today's Date: 11/22/2021  PCP: Dr. Aram Beecham REFERRING PROVIDER: Dr. Aram Beecham   OT End of Session - 11/30/21 1638     Visit Number 53    Number of Visits 72    Date for OT Re-Evaluation 01/31/22    OT Start Time 1100    OT Stop Time 1145    OT Time Calculation (min) 45 min    Activity Tolerance Patient tolerated treatment well    Behavior During Therapy [facility] for tasks assessed/performed             Past Medical History:  Diagnosis Date   Cancer ([facility])    skin   Hypothyroidism    Raynaud's disease    Past Surgical History:  Procedure Laterality Date   ABDOMINAL HYSTERECTOMY  1991   APPENDECTOMY  1991   BACK SURGERY  2010   Cervical fusion C4-5-6-7   CATARACT EXTRACTION, BILATERAL Bilateral 10/2016   CHOLECYSTECTOMY  1995   COLONOSCOPY WITH PROPOFOL N/A 12/08/2018   Procedure: COLONOSCOPY WITH PROPOFOL;  Surgeon: Toledo, Boykin Nearing, MD;  Location: [facility] ENDOSCOPY;  Service: Gastroenterology;  Laterality: N/A;   EYE SURGERY     Patient Active Problem List   Diagnosis Date Noted   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   Stroke ([facility]) 02/27/2021   History of nonmelanoma skin cancer 05/23/2014    ONSET DATE: 02/26/2021  REFERRING DIAG: R MCA CVA  THERAPY DIAG:  Muscle weakness (generalized)  Other lack of coordination  Right middle cerebral artery stroke [facility])  Rationale for Evaluation and Treatment Rehabilitation  PERTINENT HISTORY: February 26, 2021, pt reports she had a CVA, came to [facility] to ER and then was transferred to [facility] in [place] where she was admitted and after acute care she went to inpatient rehab.  Following inpt rehab, pt went home and had home health.   PRECAUTIONS: fall  SUBJECTIVE:     Pt reports she woke up sore after trying to sleep on her R side.  OT reviewed supportive  positioning strategies; pt receptive/verbalized understanding.    PAIN:  Are you having pain? Yes: NPRS scale: 2/10 Pain location:  R shoulder  Pain description: sore Aggravating factors: N/A Relieving factors: rest, heat, otc pain meds   OBJECTIVE:  TODAY'S TREATMENT:     Therapeutic Exercise: Performed passive stretching for L shoulder flex/abd/horiz abd/ER, working to increase shoulder mobility for ADLs.  Worked with jumbo pegs and hand gripper to facilitate grip strengthening; Used all but black pegs for first and 2nd trial on 6.6# grip setting with intermittent min A; cues for technique and gripping patterns.  Neuro re-ed: Facilitated L hand dexterity skills, working to place jumbo pegs into pegboard, picking up pegs from container and table top.  Pt worked with [place] stones in L hand, picking up 1 stone at a time and moving stone from 1 dish to another, then practiced scooping, storing, and releasing several at once from 1 dish to another; vc for prehension techniques.  PATIENT EDUCATION: Education details: L Corporate treasurer Person educated: pt Education method: Explanation and Verbal cues Education comprehension: verbalized understanding   HOME EXERCISE PROGRAM Continue to engage LUE into ADLs; continue gripping and pinching exercises for LUE, and L shoulder AROM/AAROM, crochet      OT Long Term Goals -       OT LONG TERM GOAL #1  Title Pt will be independent with home exercise program.    Baseline Eval: no current program, 10th visit:  continue to add new exercises as pt progresses, 20th:  continue to update HEP; 08/17/21: continue to progress HEP when indicated.  Visit 40: adding new exercises as pt progresses   Time 12    Period Weeks    Status On-going    Target Date 01/31/22      OT LONG TERM GOAL #2   Title Pt will complete UB and LB dressing with modified independence including buttons, snaps and zippers.    Baseline requires min assist at eval, 10th  visit: occasional assist with buttons, 20th:  able to perform one handed, but difficulty with bilateral UE; 08/17/21: pt reports inconsistent with 1 hand, reviewed techniques this visit, Visit 40:  Pt requires assist with bra   Time 12    Period Weeks    Status MET   Target Date 11/08/21     OT LONG TERM GOAL #3   Title Pt will perform shower transfer with modified independence.    Baseline Pt requires supervision to min assist for shower transfer at home. 10th visit: supervision; 08/17/21: supv .  Visit 40:  Pt had met goal but had recent fall and now requiring min guard to supv   Time 6    Period Weeks    Status  Met    Target Date 11/08/21      OT LONG TERM GOAL #4   Title Pt will improve L hand grip by 10# to assist with holding items in left hand securely.    Baseline no grip in left hand at eval, 10th visit:  improved flexion but still working towards composite fisting and grip. 20th:  continues to demo decreased grip; 08/17/21: active digit flexion improving, but not yet able to register grip on dynamometer; 09/04/21: L grip 1# 7/17:  5#    Time 12    Period Weeks    Status On-going    Target Date 01/31/22      OT LONG TERM GOAL #5   Title Pt will improve left shoulder flexion to 100 degrees or better to improve reaching to obtain self care items from shelf/shoulder height.    Baseline difficulty with reach, shoulder flexion to 47 degrees; 08/17/21: L shoulder flexion 85, but not yet able to consistently hold ADL supplies in L hand when reaching; 09/04/21: flexion 85 7/17:  shoulder flexion to 90   Time 12    Period Weeks    Status On-going    Target Date 01/31/22      OT LONG TERM GOAL #6   Title Pt will improve FOTO score to 47 or above to demonstrate a clinically relevant change in function to impact ADL tasks.    Baseline score of 30 at eval; 08/17/21: FOTO: 42; 09/04/21: FOTO : 54 , FOTO 7/7:  60   Time 12    Period Weeks    Status  MET    Target Date 01/31/22      OT LONG  TERM GOAL #7   Title Pt will demonstrate ability to pick up small objects and complete 9 hole peg test in less than 2 mins.    Baseline unable to perform at eval, 10th visit: still unable to pick up small pegs, 20th: unable to pick up pegs but improving; 08/17/21: not attempted d/t time constraints, will assess next visit; 09/04/21: 9 hole peg test in 5 min 53 sec;  7/17:  Pt unable to complete this date but was able to place 6 of 9 pegs in 4 mins 20 secs   Time 12    Period Weeks    Status On-going    Target Date 01/31/2022             Plan -     Clinical Impression Statement Pt reports that she switched coffee cups after OT recommendation last session (pt had dropped a cup which broke).  Pt states she can hold the cup with the handle and lid if it's empty, not yet strong enough to hold with liquid.  Continued focus on L grip strength this date, with pt demonstrating improved performance with hand gripper; able to remove jumbo pegs with intermittent min A, and up to 6 in a row independently, as compared to mod A last session.  Pt continues to progress with dexterity skills, with fair consistency to pick up 1 [place] stone at a time and move to another dish without dropping.  When trying to scoop and store multiple in palm, pt is less consistent and tends to be about 50% successful with stones grasped vs stones stored without dropping.  Continue to work towards goals in plan of care to improve left UE function for necessary daily tasks.    OT Occupational Profile and History Detailed Assessment- Review of Records and additional review of physical, cognitive, psychosocial history related to current functional performance    Occupational performance deficits (Please refer to evaluation for details): ADL's;IADL's;Leisure;Rest and Sleep    Body Structure / Function / Physical Skills ADL;Coordination;Endurance;GMC;UE functional use;Balance;IADL;Pain;Dexterity;FMC;Strength;Edema;Mobility;ROM     Psychosocial Skills Environmental  Adaptations;Habits;Routines and Behaviors    Rehab Potential Good    Clinical Decision Making Several treatment options, min-mod task modification necessary   Comorbidities Affecting Occupational Performance: May have comorbidities impacting occupational performance    Modification or Assistance to Complete Evaluation  Min-Moderate modification of tasks or assist with assess necessary to complete eval    OT Frequency 2x / week    OT Duration 12 weeks    OT Treatment/Interventions Self-care/ADL training;Cryotherapy;Paraffin;Therapeutic exercise;DME and/or AE instruction;Functional Mobility Training;Balance training;Electrical Stimulation;Ultrasound;Neuromuscular education;Manual Therapy;Splinting;Moist Heat;Contrast Bath;Passive range of motion;Therapeutic activities;Patient/family education;Coping strategies training    Plan OT recert    Consulted and Agree with Plan of Care Patient              Leta Speller, MS, OTR/L   Darleene Cleaver, OT 11/30/2021, 4:39 PM

## 2021-12-03 ENCOUNTER — Ambulatory Visit (INDEPENDENT_AMBULATORY_CARE_PROVIDER_SITE_OTHER): Payer: Medicare PPO

## 2021-12-03 DIAGNOSIS — I639 Cerebral infarction, unspecified: Secondary | ICD-10-CM | POA: Diagnosis not present

## 2021-12-03 NOTE — Therapy (Signed)
OUTPATIENT PHYSICAL THERAPY LOWER EXTREMITY TREATMENT   Patient Name: Teresa [name] MRN: [id number] DOB:01/24/1941, 81 y.o., female Today's Date: 12/04/2021   PT End of Session - 12/04/21 1112     Visit Number 9    Number of Visits 25    Date for PT Re-Evaluation 01/24/22    PT Start Time 0932    PT Stop Time 1015    PT Time Calculation (min) 43 min    Equipment Utilized During Treatment Gait belt    Activity Tolerance Patient tolerated treatment well;No increased pain    Behavior During Therapy WFL for tasks assessed/performed                     Past Medical History:  Diagnosis Date   Cancer ([place])    skin   Hypothyroidism    Raynaud's disease    Past Surgical History:  Procedure Laterality Date   ABDOMINAL HYSTERECTOMY  1991   APPENDECTOMY  1991   BACK SURGERY  2010   Cervical fusion C4-5-6-7   CATARACT EXTRACTION, BILATERAL Bilateral 10/2016   CHOLECYSTECTOMY  1995   COLONOSCOPY WITH PROPOFOL N/A 12/08/2018   Procedure: COLONOSCOPY WITH PROPOFOL;  Surgeon: Toledo, Benay Pike, MD;  Location: [facility] ENDOSCOPY;  Service: Gastroenterology;  Laterality: N/A;   EYE SURGERY     Patient Active Problem List   Diagnosis Date Noted   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 Pt presents without wearing ASO today, but does not think this is related. She reports she wasn't wearing her ASO brace days prior and didn't feel off-balance. She reports no pain in her ankle  following last appointment. Pt went up and down stairs at home and reports it went well. Pt does then say "everything feels achey."   PAIN:  Are you having pain? Yes: NPRS scale: not rated/10 Pain location: B shoulders Pain description: achey Aggravating factors: Sleeping on it Relieving factors: doing her exercises  PERTINENT HISTORY: On June 24th, 2023 pt fell pulling up her pajama pants at home, slipped, and fractured her R ankle (closed traumatic nondisplaced fracture of posterior malleolus of R tibia). Pt had been working with PT for R side MCA CVA (02/26/21). Pt now returns to PT. She is WBAT per chart (Dr. Doy Hutching). Pt uses a quad cane for ambulation. She is not wearing an ankle brace, but does report she wears a brace when out for longer periods of time (does not currently have it with her for exam). She reports she has difficulty with balance with turning and feels she is dragging her LLE. Pt reports she has no ankle pain. Feels her balance is generally off. She reports she has been moving her R ankle and walking on it for exercise. Pt is not aware of any other restrictions other than to wear her brace. Pt reports she was discharged from working with her physician for her ankle due to good progress.  Pt has not had any other falls since the one in June, just stumbling  with turns. She continues to work with OT for her UE s/p CVA. PMH includes: skin cancer, hypothyroidism, and Raynaud's disease, history of cervical fusion in 2010 C4-C7, R side MCA CVA 02/26/21.  PRECAUTIONS: Fall  WEIGHT BEARING RESTRICTIONS Yes WBAT, pt has R ankle brace but reports only wears for long-distance walking  FALLS:  Has patient fallen in last 6 months? Yes. Number of falls 1  LIVING ENVIRONMENT: Lives with: lives with their spouse Lives in: House/apartment Stairs: Yes: External: 1 steps; none Does have second floor, with handrail going up on L, but hasn't gone upstairs per report Has following equipment at  home: Quad cane small base, Environmental consultant - 2 wheeled, Wheelchair (manual), and Grab bars   PLOF: Independent with household mobility with device and reports husband has helped with bathing since stroke  PATIENT GOALS  Improve her balance with turning and not dragging her feet, "the left leg is dragging again"   OBJECTIVE:  Taken at eval unless indicated otherwise DIAGNOSTIC FINDINGS:  Information via chart - Scarlett Presto, PA 10/23/2021: "Xrays of the right ankle were ordered and interpreted 10/23/2021, with 3 views using AP, lateral, and mortise views. Xrays revealed the right ankle with a posterior malleolus nondisplaced fracture with no angulation or rotation. The ankle mortise is well-maintained. There is significant callus formation or fracture healing noted on today's x-rays."    PATIENT SURVEYS:  LEFS 44/80   FOTO: 47 (taken 11/06/2021)  COGNITION:  Overall cognitive status: Within functional limits for tasks assessed     SENSATION: WFL BLE, denies any n/t in BLE  EDEMA:  None observed currently, but does report swelling later in the day in her B ankles "just with the socks"  MUSCLE LENGTH: Hamstrings: Right -18 deg; Left -19 deg deg from neutral   POSTURE: rounded shoulders and increased thoracic kyphosis  PALPATION: Medial lower leg just superior to medial malleolus feels "bruised"  LOWER EXTREMITY ROM:  Active ROM Right eval Left eval  Ankle dorsiflexion 1 5  Ankle plantarflexion ~50 ~50   (Blank rows = not tested)  LOWER EXTREMITY MMT:   BLE grossly 4+/5 (*did not test R ankle musculature today)   FUNCTIONAL TESTS:  10 meter walk test: 0.83 m/s with QC  Berg: 51/56 taken 11/06/2021  TODAY'S TREATMENT: 12/04/21  Seated RUE BP and HR prior to interventions: 121/66 mmHg, HR 74 bpm. No other symptoms aside from feeling more off-balance and tired than usual.  TherEx: Seated ankle alphabet 1x through  Seated ankle DF RLE 25x Standing heel raises 2x20 B  with BUE support Single leg heel raises 10x each LE with BUE support GTB Seated EV/IV 12x2 sets each LE   Treadmill endurance 0.8 mph at 2% elevation x 3 minutes. BUE support. Pt fatigues quickly.  NMR: gait belt donned, CGA provided unless noted otherwise SLB 3x30 sec each LE; able to achieve 5 sec SLB  Tandem stance 2x30 sec each LE. Pt able to maintain balance for majority of 30 sec interval. STS with LE on airex 5x, 10x. First set rates easy, last set is fatiguing, states, "five was a lot easier."   PT suggests that pt contacts Dr. Doy Hutching should pt continue to feel off or bad, or if symptoms worsen. Pt does not she recently was treated for UTI and had finished an antibiotic course over a week ago. PT instructed pt to continue to monitor her symptoms. The pt verbalized understanding.   PATIENT EDUCATION:  Education details: Pt educated throughout session  about proper posture and technique with exercises. Improved exercise technique, movement at target joints, use of target muscles after min to mod verbal, visual, tactile cues.  Person educated: Patient Education method: Explanation, Demonstration, and Verbal cues Education comprehension: verbalized understanding, returned demonstration, and needs further education   HOME EXERCISE PROGRAM: no updates on this date, pt to continue HEP as previously given 8/15: Access Code: [email or web address] URL: [email or web address] Date: 11/06/2021 Prepared by: Ricard Dillon  Exercises - Supine Single Leg Ankle Pumps  - 1 x daily - 7 x weekly - 2 sets - 20 reps - Supine Active Straight Leg Raise  - 1 x daily - 5 x weekly - 2 sets - 15 reps  ASSESSMENT:  CLINICAL IMPRESSION: The patient presents to physical therapy with increased fatigue, and reports feeling more off balance than usual.  Patient recently completed antibiotic course to treat UTI.  PT instructed patient to reach out to her physician should she continue to feel bad or develop  other signs and symptoms. Pt verbalized understanding.  Patient did attempt treadmill endurance training at 2% elevation.  This was very challenging for the patient will be continued area of focus.  The patient will benefit from further skilled PT to improve strength balance and mobility.  OBJECTIVE IMPAIRMENTS Abnormal gait, decreased activity tolerance, decreased balance, decreased coordination, decreased endurance, decreased mobility, difficulty walking, decreased ROM, decreased strength, hypomobility, increased edema, impaired flexibility, impaired UE functional use, improper body mechanics, postural dysfunction, and pain.   ACTIVITY LIMITATIONS carrying, lifting, standing, stairs, bed mobility, bathing, dressing, reach over head, hygiene/grooming, and locomotion level  PARTICIPATION LIMITATIONS: meal prep, cleaning, laundry, driving, shopping, community activity, and yard work  PERSONAL FACTORS Age, Past/current experiences, Sex, and 3+ comorbidities: hypothyroidism, and Raynaud's disease, history of cervical fusion in 2010 C4-C7, R side MCA CVA 02/26/21  are also affecting patient's functional outcome.   REHAB POTENTIAL: Good  CLINICAL DECISION MAKING: Stable/uncomplicated  EVALUATION COMPLEXITY: Moderate   GOALS: Goals reviewed with patient? Yes  SHORT TERM GOALS: Target date: 12/27/2021  Patient will be independent in home exercise program to improve strength/mobility for better functional independence with ADLs. Baseline: to be initiated next 1-2 sessions; 8/15: initiated Goal status: INITIAL LONG TERM GOALS: Target date: 01/24/2022   Patient will increase FOTO score to equal to or greater than  63  to demonstrate statistically significant improvement in mobility and quality of life.  Baseline: 8/15: 47 Goal status: INITIAL  2.   Patient will increase 10 meter walk test to >1.81ms as to improve gait speed for better community ambulation and to reduce fall risk. Baseline: 0.83  m/s with QC Goal status: INITIAL  3.  Patient will increase BLE gross strength by 1/2 point on MMT as to improve functional strength for independent gait, increased standing tolerance and increased ADL ability. Baseline: grossly 4+/5 BLE, with exception of R ankle testing deferred at eval Goal status: INITIAL  4.   Patient will demonstrate an improved Berg Balance Score of at least 3 points or greater as to demonstrate improved balance with ADLs such as sitting/standing and transfer balance and reduced fall risk.  Baseline: 8/15: 51 Goal status: INITIAL  5.  Patient will increase lower extremity functional scale to >60/80 to demonstrate improved functional mobility and increased tolerance with ADLs.  Baseline: 44/80 Goal status: INITIAL     PLAN: PT FREQUENCY: 2x/week  PT DURATION: 12 weeks  PLANNED INTERVENTIONS: Therapeutic exercises, Therapeutic activity, Neuromuscular re-education, Balance training, Gait training, Patient/Family education, Self  Care, Joint mobilization, Joint manipulation, Stair training, Vestibular training, Canalith repositioning, Visual/preceptual remediation/compensation, Orthotic/Fit training, DME instructions, Electrical stimulation, Spinal mobilization, Cryotherapy, Moist heat, Splintting, Taping, Ultrasound, Manual therapy, and Re-evaluation  PLAN FOR NEXT SESSION:  balance and strength training, gentle mobility R ankle, isometrics, gait training, continue plan  Note: Portions of this document were prepared using Dragon voice recognition software and although reviewed may contain unintentional dictation errors in syntax, grammar, or spelling.  Zollie Pee, PT 12/04/2021, 11:20 AM

## 2021-12-04 ENCOUNTER — Ambulatory Visit: Payer: Medicare PPO

## 2021-12-04 ENCOUNTER — Ambulatory Visit: Payer: Medicare PPO | Admitting: Occupational Therapy

## 2021-12-04 DIAGNOSIS — M6281 Muscle weakness (generalized): Secondary | ICD-10-CM

## 2021-12-04 DIAGNOSIS — R278 Other lack of coordination: Secondary | ICD-10-CM

## 2021-12-04 DIAGNOSIS — G8929 Other chronic pain: Secondary | ICD-10-CM

## 2021-12-04 DIAGNOSIS — R2681 Unsteadiness on feet: Secondary | ICD-10-CM

## 2021-12-04 LAB — CUP PACEART REMOTE DEVICE CHECK
Date Time Interrogation Session: 20230906224744
Implantable Pulse Generator Implant Date: 19440924

## 2021-12-05 ENCOUNTER — Ambulatory Visit: Payer: Medicare PPO | Admitting: Occupational Therapy

## 2021-12-05 DIAGNOSIS — M6281 Muscle weakness (generalized): Secondary | ICD-10-CM | POA: Diagnosis not present

## 2021-12-05 DIAGNOSIS — R278 Other lack of coordination: Secondary | ICD-10-CM

## 2021-12-05 DIAGNOSIS — G8929 Other chronic pain: Secondary | ICD-10-CM

## 2021-12-05 DIAGNOSIS — R2681 Unsteadiness on feet: Secondary | ICD-10-CM

## 2021-12-06 ENCOUNTER — Ambulatory Visit: Payer: Medicare PPO

## 2021-12-06 ENCOUNTER — Ambulatory Visit: Payer: Medicare PPO | Admitting: Occupational Therapy

## 2021-12-06 DIAGNOSIS — M6281 Muscle weakness (generalized): Secondary | ICD-10-CM | POA: Diagnosis not present

## 2021-12-06 DIAGNOSIS — R262 Difficulty in walking, not elsewhere classified: Secondary | ICD-10-CM

## 2021-12-06 DIAGNOSIS — R2681 Unsteadiness on feet: Secondary | ICD-10-CM

## 2021-12-06 NOTE — Therapy (Signed)
OUTPATIENT PHYSICAL THERAPY LOWER EXTREMITY TREATMENT/Physical Therapy Progress Note   Dates of reporting period  11/01/2021   to   12/06/2021    Patient Name: Giselle Brutus MRN: [id number] DOB:08/10/1940, 81 y.o., female Today's Date: 12/06/2021   PT End of Session - 12/06/21 1013     Visit Number 10    Number of Visits 25    Date for PT Re-Evaluation 01/24/22    PT Start Time 1017    PT Stop Time 1059    PT Time Calculation (min) 42 min    Equipment Utilized During Treatment Gait belt    Activity Tolerance Patient tolerated treatment well;No increased pain    Behavior During Therapy WFL for tasks assessed/performed                      Past Medical History:  Diagnosis Date   Cancer ([place])    skin   Hypothyroidism    Raynaud's disease    Past Surgical History:  Procedure Laterality Date   ABDOMINAL HYSTERECTOMY  1991   APPENDECTOMY  1991   BACK SURGERY  2010   Cervical fusion C4-5-6-7   CATARACT EXTRACTION, BILATERAL Bilateral 10/2016   CHOLECYSTECTOMY  1995   COLONOSCOPY WITH PROPOFOL N/A 12/08/2018   Procedure: COLONOSCOPY WITH PROPOFOL;  Surgeon: Toledo, Benay Pike, MD;  Location: [facility] ENDOSCOPY;  Service: Gastroenterology;  Laterality: N/A;   EYE SURGERY     Patient Active Problem List   Diagnosis Date Noted   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 No other updates/changes.  PAIN:  Are you having pain? Yes: NPRS scale: not  rated/10 Pain location: B shoulders Pain description: achey Aggravating factors: Sleeping on it Relieving factors: doing her exercises  PERTINENT HISTORY: On June 24th, 2023 pt fell pulling up her pajama pants at home, slipped, and fractured her R ankle (closed traumatic nondisplaced fracture of posterior malleolus of R tibia). Pt had been working with PT for R side MCA CVA (02/26/21). Pt now returns to PT. She is WBAT per chart (Dr. Doy Hutching). Pt uses a quad cane for ambulation. She is not wearing an ankle brace, but does report she wears a brace when out for longer periods of time (does not currently have it with her for exam). She reports she has difficulty with balance with turning and feels she is dragging her LLE. Pt reports she has no ankle pain. Feels her balance is generally off. She reports she has been moving her R ankle and walking on it for exercise. Pt is not aware of any other restrictions other than to wear her brace. Pt reports she was discharged from working with her physician for her ankle due to good progress.  Pt has not had any other falls since the one in June, just stumbling with turns. She continues to work with OT for her UE s/p CVA. PMH includes: skin cancer, hypothyroidism, and Raynaud's disease, history of cervical fusion in 2010 C4-C7, R side MCA CVA  02/26/21.  PRECAUTIONS: Fall  WEIGHT BEARING RESTRICTIONS Yes WBAT, pt has R ankle brace but reports only wears for long-distance walking  FALLS:  Has patient fallen in last 6 months? Yes. Number of falls 1  LIVING ENVIRONMENT: Lives with: lives with their spouse Lives in: House/apartment Stairs: Yes: External: 1 steps; none Does have second floor, with handrail going up on L, but hasn't gone upstairs per report Has following equipment at home: Quad cane small base, Environmental consultant - 2 wheeled, Wheelchair (manual), and Grab bars   PLOF: Independent with household mobility with device and reports husband has helped with bathing  since stroke  PATIENT GOALS  Improve her balance with turning and not dragging her feet, "the left leg is dragging again"   OBJECTIVE:  Taken at eval unless indicated otherwise DIAGNOSTIC FINDINGS:  Information via chart - Scarlett Presto, PA 10/23/2021: "Xrays of the right ankle were ordered and interpreted 10/23/2021, with 3 views using AP, lateral, and mortise views. Xrays revealed the right ankle with a posterior malleolus nondisplaced fracture with no angulation or rotation. The ankle mortise is well-maintained. There is significant callus formation or fracture healing noted on today's x-rays."    PATIENT SURVEYS:  LEFS 44/80   FOTO: 47 (taken 11/06/2021)  COGNITION:  Overall cognitive status: Within functional limits for tasks assessed     SENSATION: WFL BLE, denies any n/t in BLE  EDEMA:  None observed currently, but does report swelling later in the day in her B ankles "just with the socks"  MUSCLE LENGTH: Hamstrings: Right -18 deg; Left -19 deg deg from neutral   POSTURE: rounded shoulders and increased thoracic kyphosis  PALPATION: Medial lower leg just superior to medial malleolus feels "bruised"  LOWER EXTREMITY ROM:  Active ROM Right eval Left eval  Ankle dorsiflexion 1 5  Ankle plantarflexion ~50 ~50   (Blank rows = not tested)  LOWER EXTREMITY MMT:   BLE grossly 4+/5 (*did not test R ankle musculature today)   FUNCTIONAL TESTS:  10 meter walk test: 0.83 m/s with QC  Berg: 51/56 taken 11/06/2021  TODAY'S TREATMENT: 12/06/21 TherAct: Goal retesting completed on this date. PT instructed pt throughout in technique with testing and indications of test performance on progress/plan. See goal section below for details.  NMR: SLB:2x30 sec each LE TherEx: Standing hip abd 15x each LE   PATIENT EDUCATION:  Education details: Pt educated throughout session about proper posture and technique with exercises. Improved exercise technique, movement at  target joints, use of target muscles after min to mod verbal, visual, tactile cues.  Person educated: Patient Education method: Explanation, Demonstration, and Verbal cues Education comprehension: verbalized understanding, returned demonstration, and needs further education   HOME EXERCISE PROGRAM: no updates on this date, pt to continue HEP as previously given 8/15: Access Code: [email or web address] URL: [email or web address] Date: 11/06/2021 Prepared by: Ricard Dillon  Exercises - Supine Single Leg Ankle Pumps  - 1 x daily - 7 x weekly - 2 sets - 20 reps - Supine Active Straight Leg Raise  - 1 x daily - 5 x weekly - 2 sets - 15 reps  ASSESSMENT:  CLINICAL IMPRESSION: Pt presents to PT feeling better today. Goal retesting completed for progress note. Pt making progress AEB partially meeting 10MWT goal, meeting Berg goal, demo'ing improved R ankle strength, and  by completing 6MWT. This indicates improvements in gait speed, gait ability, balance, and strength, and decreased fall risk. While pt making excellent progress she has not yet met all  her therapy goals. The patient will benefit from further skilled PT to improve strength balance and mobility.  OBJECTIVE IMPAIRMENTS Abnormal gait, decreased activity tolerance, decreased balance, decreased coordination, decreased endurance, decreased mobility, difficulty walking, decreased ROM, decreased strength, hypomobility, increased edema, impaired flexibility, impaired UE functional use, improper body mechanics, postural dysfunction, and pain.   ACTIVITY LIMITATIONS carrying, lifting, standing, stairs, bed mobility, bathing, dressing, reach over head, hygiene/grooming, and locomotion level  PARTICIPATION LIMITATIONS: meal prep, cleaning, laundry, driving, shopping, community activity, and yard work  PERSONAL FACTORS Age, Past/current experiences, Sex, and 3+ comorbidities: hypothyroidism, and Raynaud's disease, history of cervical fusion in  2010 C4-C7, R side MCA CVA 02/26/21  are also affecting patient's functional outcome.   REHAB POTENTIAL: Good  CLINICAL DECISION MAKING: Stable/uncomplicated  EVALUATION COMPLEXITY: Moderate   GOALS: Goals reviewed with patient? Yes  SHORT TERM GOALS: Target date: 12/27/2021  Patient will be independent in home exercise program to improve strength/mobility for better functional independence with ADLs. Baseline: to be initiated next 1-2 sessions; 8/15: initiated; 9/14: Pt reports she indep Goal status: MET LONG TERM GOALS: Target date: 01/24/2022   Patient will increase FOTO score to equal to or greater than  63  to demonstrate statistically significant improvement in mobility and quality of life.  Baseline: 8/15: 47; 9/14: 66% Goal status: MET  2.   Patient will increase 10 meter walk test to >1.52ms as to improve gait speed for better community ambulation and to reduce fall risk. Baseline: 0.83 m/s with QC; 9/14: 0.92 m/s with QC, without AD 0.91 m/s Goal status: Partially MET  3.  Patient will increase BLE gross strength by 1/2 point on MMT as to improve functional strength for independent gait, increased standing tolerance and increased ADL ability. Baseline: grossly 4+/5 BLE, with exception of R ankle testing deferred at eval; 9/15: grossly 4+/5 B including R ankle Goal status: IN PROGRESS  4.   Patient will demonstrate an improved Berg Balance Score of at least 3 points or greater as to demonstrate improved balance with ADLs such as sitting/standing and transfer balance and reduced fall risk.  Baseline: 8/15: 51; 9/14: 54/56 Goal status: MET  5.  Patient will increase lower extremity functional scale to >60/80 to demonstrate improved functional mobility and increased tolerance with ADLs.  Baseline: 44/80; 9/14: 50/80 Goal status: INITIAL   6.  Patient will increase six minute walk test distance to >1300 ft for improve gait ability and return to PLOF to complete her walks in  her neighborhood.  Baseline: 1194 ft without AD, with SBA, pt reported feeling fatigued in hips around minutes 4 Goal status: INITIAL    PLAN: PT FREQUENCY: 2x/week  PT DURATION: 12 weeks  PLANNED INTERVENTIONS: Therapeutic exercises, Therapeutic activity, Neuromuscular re-education, Balance training, Gait training, Patient/Family education, Self Care, Joint mobilization, Joint manipulation, Stair training, Vestibular training, Canalith repositioning, Visual/preceptual remediation/compensation, Orthotic/Fit training, DME instructions, Electrical stimulation, Spinal mobilization, Cryotherapy, Moist heat, Splintting, Taping, Ultrasound, Manual therapy, and Re-evaluation  PLAN FOR NEXT SESSION:  balance and strength training, gentle mobility R ankle, isometrics, gait training, continue plan   HZollie Pee PT 12/06/2021, 2:53 PM

## 2021-12-10 ENCOUNTER — Encounter: Payer: Self-pay | Admitting: Occupational Therapy

## 2021-12-10 NOTE — Therapy (Signed)
OUTPATIENT OCCUPATIONAL THERAPY TREATMENT NOTE  Patient Name: Teresa [name] MRN: [id number] DOB:1940/04/25, 81 y.o., female Today's Date: 12/04/2021  PCP: Dr. [name] REFERRING PROVIDER: Dr. [name]   OT End of Session      Visit Number 54    Number of Visits 72    Date for OT Re-Evaluation 01/31/22    OT Start Time 0626    OT Stop Time 1059    OT Time Calculation (min) 44 min    Activity Tolerance Patient tolerated treatment well    Behavior During Therapy [facility] for tasks assessed/performed             Past Medical History:  Diagnosis Date   Cancer ([place])    skin   Hypothyroidism    Raynaud's disease    Past Surgical History:  Procedure Laterality Date   [place]   BACK SURGERY  2010   Cervical fusion C4-5-6-7   CATARACT EXTRACTION, BILATERAL Bilateral 10/2016   CHOLECYSTECTOMY  1995   COLONOSCOPY WITH PROPOFOL N/A 12/08/2018   Procedure: COLONOSCOPY WITH PROPOFOL;  Surgeon: Toledo, Benay Pike, MD;  Location: [facility] ENDOSCOPY;  Service: Gastroenterology;  Laterality: N/A;   EYE SURGERY     Patient Active Problem List   Diagnosis Date Noted   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  Following inpt rehab, pt went home and had home health.   PRECAUTIONS: fall  SUBJECTIVE:    Pt denies pain at rest this date but reports she feels "wishy washy" today, just doesn't feel  good. Reports her new compression glove is working well and fits well, "I had to get used to it been a little bit tighter than the other one which was stretched out. "     PAIN:  Are you having pain? Yes: NPRS scale: 0/10 Pain location:    Pain description:   Aggravating factors: N/A Relieving factors: rest, heat, otc pain meds   OBJECTIVE:  TODAY'S TREATMENT:   Therapeutic Exercise: Pt seen this date with emphasis on grip strength of left hand with use of hand gripper placed on 1st setting with 6.6# pounds of pressure, cues for proper positioning of hand on gripper but able to perform with decreased guiding from therapist this date. 1 set of 20 reps completed, dropping items frequently and required cues for sustained gripping patterns to pick up large pegs from board with gripper and place into container.  She was able to pick up pegs from container and place into board.  Pt also seen for task of assembly of PVC pipes, picking up from container and placing a variety of connectors and fittings on the ends to erect/build project.  Cues at times for bilateral hand use and to use left hand as a lead when connecting pieces, removing pieces and securing pieces together.     ADL: Pt seen this date for ADL/fine motor coordination task of typing to  formulate emails.  Instructed on typing drills online for use as a part of her home program to work towards improving isolated finger movements, coordination, speed and dexterity.  Pt able to complete typing drill 2x with 2 WPM.      PATIENT EDUCATION: Education details: L Air traffic controller Person educated: pt Education method: Explanation and Verbal cues Education comprehension: verbalized understanding   HOME EXERCISE PROGRAM Continue to engage LUE into ADLs; continue gripping and pinching exercises for LUE, and L shoulder AROM/AAROM, crochet      OT Long Term Goals -       OT LONG TERM GOAL #1   Title Pt will be independent with home  exercise program.    Baseline Eval: no current program, 10th visit:  continue to add new exercises as pt progresses, 20th:  continue to update HEP; 08/17/21: continue to progress HEP when indicated.  Visit 40: adding new exercises as pt progresses   Time 12    Period Weeks    Status On-going    Target Date 01/31/22      OT LONG TERM GOAL #2   Title Pt will complete UB and LB dressing with modified independence including buttons, snaps and zippers.    Baseline requires min assist at eval, 10th visit: occasional assist with buttons, 20th:  able to perform one handed, but difficulty with bilateral UE; 08/17/21: pt reports inconsistent with 1 hand, reviewed techniques this visit, Visit 40:  Pt requires assist with bra   Time 12    Period Weeks    Status MET   Target Date 11/08/21     OT LONG TERM GOAL #3   Title Pt will perform shower transfer with modified independence.    Baseline Pt requires supervision to min assist for shower transfer at home. 10th visit: supervision; 08/17/21: supv .  Visit 40:  Pt had met goal but had recent fall and now requiring min guard to supv   Time 6    Period Weeks    Status  Met    Target Date 11/08/21      OT LONG TERM GOAL #4   Title Pt will improve L hand grip by 10# to assist with holding items in left hand securely.    Baseline no grip in left hand at eval, 10th visit:  improved flexion but still working towards composite fisting and grip. 20th:  continues to demo decreased grip; 08/17/21: active digit flexion improving, but not yet able to register grip on dynamometer; 09/04/21: L grip 1# 7/17:  5#    Time 12    Period Weeks    Status On-going    Target Date 01/31/22      OT LONG TERM GOAL #5   Title Pt will improve left shoulder flexion to 100 degrees or better to improve reaching to obtain self care items from shelf/shoulder height.    Baseline difficulty with reach, shoulder flexion to 47 degrees; 08/17/21: L shoulder flexion 85, but not yet able to  consistently hold ADL supplies in L hand when reaching; 09/04/21: flexion 85 7/17:  shoulder flexion to 90   Time 12    Period Weeks    Status On-going    Target Date 01/31/22      OT LONG TERM GOAL #6   Title Pt will improve FOTO score to 47 or above to demonstrate a clinically relevant change in function to impact ADL tasks.    Baseline score of 30 at eval; 08/17/21: FOTO:  42; 09/04/21: FOTO : 54 , FOTO 7/7:  60   Time 12    Period Weeks    Status  MET    Target Date 01/31/22      OT LONG TERM GOAL #7   Title Pt will demonstrate ability to pick up small objects and complete 9 hole peg test in less than 2 mins.    Baseline unable to perform at eval, 10th visit: still unable to pick up small pegs, 20th: unable to pick up pegs but improving; 08/17/21: not attempted d/t time constraints, will assess next visit; 09/04/21: 9 hole peg test in 5 min 53 sec;  7/17:  Pt unable to complete this date but was able to place 6 of 9 pegs in 4 mins 20 secs   Time 12    Period Weeks    Status On-going    Target Date 01/31/2022             Plan -     Clinical Impression Statement Pt demonstrating improvements in left hand skills for picking up and manipulating objects, discussed dropping items and the need to use vision as a compensatory strategy at home when engaging in tasks and holding items.  She reports if she is not focused on the item she often drops items without realizing it.  Pt able to pick up items this date from containers rather than therapist having to place onto flat surface.  Pt indicating the need to be able to type to formulate emails to friends and family but reports she has been only using her right hand.  Typing assessment this date and pt able to complete 2 WPM on tests.  Decreased isolated finger movement on the left and often hits more than one key at a time or is holding down a key without realizing it.  Instructed on drills to try at home as a part of her home program for typing.   Continue to work towards goals in plan of care to maximize safety and independence in necessary daily tasks at home and in the community.     OT Occupational Profile and History Detailed Assessment- Review of Records and additional review of physical, cognitive, psychosocial history related to current functional performance    Occupational performance deficits (Please refer to evaluation for details): ADL's;IADL's;Leisure;Rest and Sleep    Body Structure / Function / Physical Skills ADL;Coordination;Endurance;GMC;UE functional use;Balance;IADL;Pain;Dexterity;FMC;Strength;Edema;Mobility;ROM    Psychosocial Skills Environmental  Adaptations;Habits;Routines and Behaviors    Rehab Potential Good    Clinical Decision Making Several treatment options, min-mod task modification necessary   Comorbidities Affecting Occupational Performance: May have comorbidities impacting occupational performance    Modification or Assistance to Complete Evaluation  Min-Moderate modification of tasks or assist with assess necessary to complete eval    OT Frequency 2x / week    OT Duration 12 weeks    OT Treatment/Interventions Self-care/ADL training;Cryotherapy;Paraffin;Therapeutic exercise;DME and/or AE instruction;Functional Mobility Training;Balance training;Electrical Stimulation;Ultrasound;Neuromuscular education;Manual Therapy;Splinting;Moist Heat;Contrast Bath;Passive range of motion;Therapeutic activities;Patient/family education;Coping strategies training    Plan OT recert    Consulted and Agree with Plan of Care Patient            Myana Schlup Colette Ribas, OT 12/10/2021, 4:11 PM

## 2021-12-10 NOTE — Therapy (Signed)
OUTPATIENT OCCUPATIONAL THERAPY TREATMENT NOTE  Patient Name: Teresa [name] MRN: [id number] DOB:10-18-1940, 81 y.o., female Today's Date: 12/05/2021  PCP: Dr. [name] REFERRING PROVIDER: Dr. [name]   OT End of Session -     Visit Number 55    Number of Visits 72    Date for OT Re-Evaluation 01/31/22    OT Start Time 1015    OT Stop Time 1100    OT Time Calculation (min) 45 min    Activity Tolerance Patient tolerated treatment well    Behavior During Therapy [facility] for tasks assessed/performed                 Past Medical History:  Diagnosis Date   Cancer ([place])    skin   Hypothyroidism    Raynaud's disease    Past Surgical History:  Procedure Laterality Date   [place]   BACK SURGERY  2010   Cervical fusion C4-5-6-7   CATARACT EXTRACTION, BILATERAL Bilateral 10/2016   CHOLECYSTECTOMY  1995   COLONOSCOPY WITH PROPOFOL N/A 12/08/2018   Procedure: COLONOSCOPY WITH PROPOFOL;  Surgeon: Toledo, Benay Pike, MD;  Location: [facility] ENDOSCOPY;  Service: Gastroenterology;  Laterality: N/A;   EYE SURGERY     Patient Active Problem List   Diagnosis Date Noted   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  Following inpt rehab, pt went home and had home health.   PRECAUTIONS: fall  SUBJECTIVE:    Pt reports she feels much better today than she did yesterday.  Denies any pain this date  at rest. Did better withholding napkin and using vision to compensate last night and did not drop napkin as she did in the past.  She reports she worked on typing drills when she got home and did much better than in the clinic after several trials.     PAIN:  Are you having pain? Yes: NPRS scale: 0/10 Pain location:    Pain description:   Aggravating factors: N/A Relieving factors: rest, heat, otc pain meds   OBJECTIVE:  TODAY'S TREATMENT:   Therapeutic Exercise: Pt seen for UE strengthening with use of 1# dowel for shoulder flexion, ABD, ADD, forwards and backwards circles, plus dowel climbing with dowel in a vertical position, 2 sets of 12 reps each.  Cues for proper form and technique.   Pt seen for finger strengthening with use of resistive Push pins to pick up and place with left hand into mild resistance board , cues for prehension patterns and to push pins into board fully.   Neuromuscular reeducation:  Pt seen for manipulation of medium washers to place onto magnets to encourage reach with left UE.   Difficulty obtaining washers from magnetic bowl but able to pick up from table Easel on table with magnets at shoulder height reach    ADL:  Pt seen for typing drills with results as follows: 13, 12, 14,  12 WPM.    PATIENT EDUCATION: Education details: L Air traffic controller Person educated: pt Education method: Explanation and Verbal cues Education comprehension: verbalized understanding   HOME EXERCISE PROGRAM Continue to engage LUE into ADLs; continue gripping and pinching exercises for LUE, and L shoulder AROM/AAROM, crochet , typing    OT Long Term Goals -       OT LONG TERM GOAL #1   Title Pt will be independent with home exercise program.    Baseline Eval: no current program, 10th visit:  continue to add new exercises as pt progresses, 20th:  continue to update HEP; 08/17/21: continue to progress HEP when indicated.  Visit 40: adding new exercises as pt progresses    Time 12    Period Weeks    Status On-going    Target Date 01/31/22      OT LONG TERM GOAL #2   Title Pt will complete UB and LB dressing with modified independence including buttons, snaps and zippers.    Baseline requires min assist at eval, 10th visit: occasional assist with buttons, 20th:  able to perform one handed, but difficulty with bilateral UE; 08/17/21: pt reports inconsistent with 1 hand, reviewed techniques this visit, Visit 40:  Pt requires assist with bra   Time 12    Period Weeks    Status MET   Target Date 11/08/21     OT LONG TERM GOAL #3   Title Pt will perform shower transfer with modified independence.    Baseline Pt requires supervision to min assist for shower transfer at home. 10th visit: supervision; 08/17/21: supv .  Visit 40:  Pt had met goal but had recent fall and now requiring min guard to supv   Time 6    Period Weeks    Status  Met    Target Date 11/08/21      OT LONG TERM GOAL #4   Title Pt will improve L hand grip by 10# to assist with holding items in left hand securely.    Baseline no grip in left hand at eval, 10th visit:  improved flexion but still working towards composite fisting and grip. 20th:  continues to demo decreased grip; 08/17/21: active digit flexion improving, but not yet able to register grip on dynamometer; 09/04/21: L grip 1# 7/17:  5#    Time 12    Period Weeks    Status On-going    Target Date 01/31/22      OT LONG TERM GOAL #5   Title Pt will improve left shoulder flexion to 100 degrees or better to improve reaching to obtain self care items from shelf/shoulder height.    Baseline difficulty with reach, shoulder flexion to 47 degrees; 08/17/21: L shoulder flexion 85, but not yet able to consistently hold ADL supplies in L hand when reaching; 09/04/21: flexion 85 7/17:  shoulder flexion to 90   Time 12    Period Weeks    Status On-going    Target Date 01/31/22      OT LONG TERM GOAL #6   Title Pt will improve FOTO score to 47  or above to demonstrate a clinically relevant change in function to impact ADL tasks.    Baseline score of 30 at eval; 08/17/21: FOTO: 42; 09/04/21: FOTO : 54 , FOTO 7/7:  60   Time 12    Period Weeks    Status  MET    Target Date 01/31/22      OT LONG TERM  GOAL #7   Title Pt will demonstrate ability to pick up small objects and complete 9 hole peg test in less than 2 mins.    Baseline unable to perform at eval, 10th visit: still unable to pick up small pegs, 20th: unable to pick up pegs but improving; 08/17/21: not attempted d/t time constraints, will assess next visit; 09/04/21: 9 hole peg test in 5 min 53 sec;  7/17:  Pt unable to complete this date but was able to place 6 of 9 pegs in 4 mins 20 secs   Time 12    Period Weeks    Status On-going    Target Date 01/31/2022             Plan -     Clinical Impression Statement Typing improved this date from 2 WPM to average of 12 WPM this date over multiple trials.  Pt able to complete with decreased errors overall and improved isolated finger movements.  Pt continues to work towards improving manipulation of small objects, increased difficulty with combining reach with manipulation skills with washer and magnets this date.  She also demonstrated difficulty with being able to pick up washers from magnetic bowl with increased resistance.  No pain in shoulder this date with exercises.  Continue to work towards goals in plan of care to improve LUE ROM, coordination and functional use for daily tasks.     OT Occupational Profile and History Detailed Assessment- Review of Records and additional review of physical, cognitive, psychosocial history related to current functional performance    Occupational performance deficits (Please refer to evaluation for details): ADL's;IADL's;Leisure;Rest and Sleep    Body Structure / Function / Physical Skills ADL;Coordination;Endurance;GMC;UE functional  use;Balance;IADL;Pain;Dexterity;FMC;Strength;Edema;Mobility;ROM    Psychosocial Skills Environmental  Adaptations;Habits;Routines and Behaviors    Rehab Potential Good    Clinical Decision Making Several treatment options, min-mod task modification necessary   Comorbidities Affecting Occupational Performance: May have comorbidities impacting occupational performance    Modification or Assistance to Complete Evaluation  Min-Moderate modification of tasks or assist with assess necessary to complete eval    OT Frequency 2x / week    OT Duration 12 weeks    OT Treatment/Interventions Self-care/ADL training;Cryotherapy;Paraffin;Therapeutic exercise;DME and/or AE instruction;Functional Mobility Training;Balance training;Electrical Stimulation;Ultrasound;Neuromuscular education;Manual Therapy;Splinting;Moist Heat;Contrast Bath;Passive range of motion;Therapeutic activities;Patient/family education;Coping strategies training    Plan OT recert    Consulted and Agree with Plan of Care Patient            Staci Carver Colette Ribas, OT 12/10/2021, 4:41 PM

## 2021-12-11 ENCOUNTER — Ambulatory Visit: Payer: Medicare PPO

## 2021-12-11 DIAGNOSIS — R278 Other lack of coordination: Secondary | ICD-10-CM

## 2021-12-11 DIAGNOSIS — M6281 Muscle weakness (generalized): Secondary | ICD-10-CM

## 2021-12-11 DIAGNOSIS — I63511 Cerebral infarction due to unspecified occlusion or stenosis of right middle cerebral artery: Secondary | ICD-10-CM

## 2021-12-11 DIAGNOSIS — R2681 Unsteadiness on feet: Secondary | ICD-10-CM

## 2021-12-11 DIAGNOSIS — R262 Difficulty in walking, not elsewhere classified: Secondary | ICD-10-CM

## 2021-12-11 NOTE — Therapy (Signed)
OUTPATIENT PHYSICAL THERAPY LOWER EXTREMITY TREATMENT    Patient Name: Teresa [name] MRN: [id number] DOB:[birth year], 81 y.o., female Today's Date: 12/11/2021   PT End of Session - 12/11/21 1711     Visit Number 11    Number of Visits 25    Date for PT Re-Evaluation 01/24/22    PT Start Time 1103    PT Stop Time 1145    PT Time Calculation (min) 42 min    Equipment Utilized During Treatment Gait belt    Activity Tolerance Patient tolerated treatment well;No increased pain    Behavior During Therapy WFL for tasks assessed/performed                       Past Medical History:  Diagnosis Date   Cancer ([place])    skin   Hypothyroidism    Raynaud's disease    Past Surgical History:  Procedure Laterality Date   ABDOMINAL HYSTERECTOMY  1991   APPENDECTOMY  1991   BACK SURGERY  2010   Cervical fusion C4-5-6-7   CATARACT EXTRACTION, BILATERAL Bilateral 10/2016   CHOLECYSTECTOMY  1995   COLONOSCOPY WITH PROPOFOL N/A 12/08/2018   Procedure: COLONOSCOPY WITH PROPOFOL;  Surgeon: Toledo, Benay Pike, MD;  Location: [facility] ENDOSCOPY;  Service: Gastroenterology;  Laterality: N/A;   EYE SURGERY     Patient Active Problem List   Diagnosis Date Noted   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 Reports no pain currently.  PAIN:  Are you having pain? Yes: NPRS scale: 0/10 Pain location: B  shoulders Pain description: achey Aggravating factors: Sleeping on it Relieving factors: doing her exercises  PERTINENT HISTORY: On June 24th, 2023 pt fell pulling up her pajama pants at home, slipped, and fractured her R ankle (closed traumatic nondisplaced fracture of posterior malleolus of R tibia). Pt had been working with PT for R side MCA CVA (02/26/21). Pt now returns to PT. She is WBAT per chart (Dr. Doy Hutching). Pt uses a quad cane for ambulation. She is not wearing an ankle brace, but does report she wears a brace when out for longer periods of time (does not currently have it with her for exam). She reports she has difficulty with balance with turning and feels she is dragging her LLE. Pt reports she has no ankle pain. Feels her balance is generally off. She reports she has been moving her R ankle and walking on it for exercise. Pt is not aware of any other restrictions other than to wear her brace. Pt reports she was discharged from working with her physician for her ankle due to good progress.  Pt has not had any other falls since the one in June, just stumbling with turns. She continues to work with OT for her UE s/p CVA. PMH includes: skin cancer, hypothyroidism, and Raynaud's disease, history of cervical fusion in 2010 C4-C7, R side MCA CVA 02/26/21.  PRECAUTIONS: Fall  WEIGHT BEARING  RESTRICTIONS Yes WBAT, pt has R ankle brace but reports only wears for long-distance walking  FALLS:  Has patient fallen in last 6 months? Yes. Number of falls 1  LIVING ENVIRONMENT: Lives with: lives with their spouse Lives in: House/apartment Stairs: Yes: External: 1 steps; none Does have second floor, with handrail going up on L, but hasn't gone upstairs per report Has following equipment at home: Quad cane small base, Environmental consultant - 2 wheeled, Wheelchair (manual), and Grab bars   PLOF: Independent with household mobility with device and reports husband has helped with bathing since stroke  PATIENT GOALS   Improve her balance with turning and not dragging her feet, "the left leg is dragging again"   OBJECTIVE:  Taken at eval unless indicated otherwise DIAGNOSTIC FINDINGS:  Information via chart - Scarlett Presto, PA 10/23/2021: "Xrays of the right ankle were ordered and interpreted 10/23/2021, with 3 views using AP, lateral, and mortise views. Xrays revealed the right ankle with a posterior malleolus nondisplaced fracture with no angulation or rotation. The ankle mortise is well-maintained. There is significant callus formation or fracture healing noted on today's x-rays."    PATIENT SURVEYS:  LEFS 44/80   FOTO: 47 (taken 11/06/2021)  COGNITION:  Overall cognitive status: Within functional limits for tasks assessed     SENSATION: WFL BLE, denies any n/t in BLE  EDEMA:  None observed currently, but does report swelling later in the day in her B ankles "just with the socks"  MUSCLE LENGTH: Hamstrings: Right -18 deg; Left -19 deg deg from neutral   POSTURE: rounded shoulders and increased thoracic kyphosis  PALPATION: Medial lower leg just superior to medial malleolus feels "bruised"  LOWER EXTREMITY ROM:  Active ROM Right eval Left eval  Ankle dorsiflexion 1 5  Ankle plantarflexion ~50 ~50   (Blank rows = not tested)  LOWER EXTREMITY MMT:   BLE grossly 4+/5 (*did not test R ankle musculature today)   FUNCTIONAL TESTS:  10 meter walk test: 0.83 m/s with QC  Berg: 51/56 taken 11/06/2021  TODAY'S TREATMENT:   NMR: Amb outdoors for endurance and dynamic balance: grass, pine needles, small curbs, inclines/declines, turns, with and without SPC. Close CGA throughout. Occ foot scuff and increased variability in BOS. Pt requests rest following intervention due to fatigue. X 23 minutes  TherEx Seated 5# AW on BLE: LAQ 20x, 10x sets each LE. Cuing for decreased speed March 2x10 each LE  STS 10x 2 sets. Rates easy, however, pt fatigued.  Seated: Heel raises 2x20  B Seated DF 2x20 B. Cuing for increased LLE DF. Able to correct with cues   RLE only: Ankle alphabet x 1 round. Demo/vc throughout INV/EV with RTB 10x each cuing throughout. Rates medium   PATIENT EDUCATION:  Education details: Pt educated throughout session about proper posture and technique with exercises. Improved exercise technique, movement at target joints, use of target muscles after min to mod verbal, visual, tactile cues.  Person educated: Patient Education method: Explanation, Demonstration, and Verbal cues Education comprehension: verbalized understanding, returned demonstration, and needs further education   HOME EXERCISE PROGRAM: no updates on this date, pt to continue HEP as previously given 8/15: Access Code: [email or web address] URL: [email or web address] Date: 11/06/2021 Prepared by: Ricard Dillon  Exercises - Supine Single Leg Ankle Pumps  - 1 x daily - 7 x weekly - 2 sets - 20 reps - Supine Active Straight Leg Raise  - 1 x daily - 5 x weekly - 2 sets - 15 reps  ASSESSMENT:  CLINICAL IMPRESSION: Continued progressing pt to higher level gait/dynamic balance activities outdoors. Additional focus was on endurance as well. Pt able to perform 23 minutes of ambulation outdoors over different surfaces and up/down inclines before requiring a rest break. This indicates improved endurance. However, pt reported difficulty with ambulating over grass, and demonstrated mild unsteadiness. The patient will benefit from further skilled PT to improve strength, balance and mobility.  OBJECTIVE IMPAIRMENTS Abnormal gait, decreased activity tolerance, decreased balance, decreased coordination, decreased endurance, decreased mobility, difficulty walking, decreased ROM, decreased strength, hypomobility, increased edema, impaired flexibility, impaired UE functional use, improper body mechanics, postural dysfunction, and pain.   ACTIVITY LIMITATIONS carrying, lifting, standing, stairs, bed  mobility, bathing, dressing, reach over head, hygiene/grooming, and locomotion level  PARTICIPATION LIMITATIONS: meal prep, cleaning, laundry, driving, shopping, community activity, and yard work  PERSONAL FACTORS Age, Past/current experiences, Sex, and 3+ comorbidities: hypothyroidism, and Raynaud's disease, history of cervical fusion in 2010 C4-C7, R side MCA CVA 02/26/21  are also affecting patient's functional outcome.   REHAB POTENTIAL: Good  CLINICAL DECISION MAKING: Stable/uncomplicated  EVALUATION COMPLEXITY: Moderate   GOALS: Goals reviewed with patient? Yes  SHORT TERM GOALS: Target date: 12/27/2021  Patient will be independent in home exercise program to improve strength/mobility for better functional independence with ADLs. Baseline: to be initiated next 1-2 sessions; 8/15: initiated; 9/14: Pt reports she indep Goal status: MET LONG TERM GOALS: Target date: 01/24/2022   Patient will increase FOTO score to equal to or greater than  63  to demonstrate statistically significant improvement in mobility and quality of life.  Baseline: 8/15: 47; 9/14: 66% Goal status: MET  2.   Patient will increase 10 meter walk test to >1.75m/s as to improve gait speed for better community ambulation and to reduce fall risk. Baseline: 0.83 m/s with QC; 9/14: 0.92 m/s with QC, without AD 0.91 m/s Goal status: Partially MET  3.  Patient will increase BLE gross strength by 1/2 point on MMT as to improve functional strength for independent gait, increased standing tolerance and increased ADL ability. Baseline: grossly 4+/5 BLE, with exception of R ankle testing deferred at eval; 9/15: grossly 4+/5 B including R ankle Goal status: IN PROGRESS  4.   Patient will demonstrate an improved Berg Balance Score of at least 3 points or greater as to demonstrate improved balance with ADLs such as sitting/standing and transfer balance and reduced fall risk.  Baseline: 8/15: 51; 9/14: 54/56 Goal status:  MET  5.  Patient will increase lower extremity functional scale to >60/80 to demonstrate improved functional mobility and increased tolerance with ADLs.  Baseline: 44/80; 9/14: 50/80 Goal status: INITIAL   6.  Patient will increase six minute walk test distance to >1300 ft for improve gait ability and return to PLOF to complete her walks in her neighborhood.  Baseline: 1194 ft without AD, with SBA, pt reported feeling fatigued in hips around minutes 4 Goal status: INITIAL    PLAN: PT FREQUENCY: 2x/week  PT DURATION: 12 weeks  PLANNED INTERVENTIONS: Therapeutic exercises, Therapeutic activity, Neuromuscular re-education, Balance training, Gait training, Patient/Family education, Self Care, Joint mobilization, Joint manipulation, Stair training, Vestibular training, Canalith repositioning, Visual/preceptual remediation/compensation, Orthotic/Fit training, DME instructions, Electrical stimulation, Spinal mobilization, Cryotherapy, Moist heat, Splintting, Taping, Ultrasound, Manual therapy, and Re-evaluation  PLAN FOR NEXT SESSION:  balance and strength training, gentle mobility R ankle, isometrics, gait training, continue plan   Zollie Pee, PT 12/11/2021, 5:17 PM

## 2021-12-12 NOTE — Therapy (Signed)
OUTPATIENT OCCUPATIONAL THERAPY TREATMENT NOTE  Patient Name: Teresa [name] MRN: [id number] DOB:12/27/1940, 80 y.o., female Today's Date: 12/05/2021  PCP: Dr. Jeffrey Sparks REFERRING PROVIDER: Dr. Jeffrey Sparks   OT End of Session -     Visit Number 55    Number of Visits 72    Date for OT Re-Evaluation 01/31/22    OT Start Time 1015    OT Stop Time 1100    OT Time Calculation (min) 45 min    Activity Tolerance Patient tolerated treatment well    Behavior During Therapy WFL for tasks assessed/performed                 Past Medical History:  Diagnosis Date   Cancer ([facility])    skin   Hypothyroidism    Raynaud's disease    Past Surgical History:  Procedure Laterality Date   ABDOMINAL HYSTERECTOMY  1991   APPENDECTOMY  1991   BACK SURGERY  2010   Cervical fusion C4-5-6-7   CATARACT EXTRACTION, BILATERAL Bilateral 10/2016   CHOLECYSTECTOMY  1995   COLONOSCOPY WITH PROPOFOL N/A 12/08/2018   Procedure: COLONOSCOPY WITH PROPOFOL;  Surgeon: Toledo, Teodoro K, MD;  Location: [facility] ENDOSCOPY;  Service: Gastroenterology;  Laterality: N/A;   EYE SURGERY     Patient Active Problem List   Diagnosis Date Noted   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   Stroke ([facility]) 02/27/2021   History of nonmelanoma skin cancer 05/23/2014    ONSET DATE: 02/26/2021  REFERRING DIAG: R MCA CVA  THERAPY DIAG:  Muscle weakness (generalized)  Other lack of coordination  Right middle cerebral artery stroke ([facility])  Rationale for Evaluation and Treatment Rehabilitation  PERTINENT HISTORY: February 26, 2021, pt reports she had a CVA, came to [facility] to ER and then was transferred to [facility] in [place] where she was admitted and after acute care she went to inpatient rehab.  Following inpt rehab, pt went home and had home health.   PRECAUTIONS: fall  SUBJECTIVE:  Pt reports she's looking forward to a show tonight in [place] that she will attend with some  neighbor friends.  PAIN:  Are you having pain? Yes: NPRS scale: 0/10 Pain location:    Pain description:   Aggravating factors: N/A Relieving factors: rest, heat, otc pain meds   OBJECTIVE:  TODAY'S TREATMENT:   Therapeutic Exercise: Performed passive stretching for L shoulder flex/abd/horiz abd/ER, working to increase shoulder mobility for ADLs.  Worked with jumbo pegs and hand gripper to facilitate grip strengthening; Used all but black pegs on 6.6# grip setting with cues for technique, rest breaks, and gripping patterns, and able to perform without OT physical assist to squeeze hand gripper.  2 trials completed.  Neuromuscular reeducation:  Practiced LUE GMC skills bouncing, tossing, and 1 and 2 hand catching of small stress ball.  Pt was able to throw over and underhand using L.  Practiced initially catching ball with 2 hands, progressed to L hand only catching ball against torso, with steady progression to catching ball mid air with L hand cradling ball palm up.     PATIENT EDUCATION: Education details: LUE GMC skills Person educated: pt Education method: Explanation and Verbal cues Education comprehension: verbalized understanding   HOME EXERCISE PROGRAM Continue to engage LUE into ADLs; continue gripping and pinching exercises for LUE, and L shoulder AROM/AAROM, crochet , typing    OT Long Term Goals -       OT LONG TERM GOAL #1     Title Pt will be independent with home exercise program.    Baseline Eval: no current program, 10th visit:  continue to add new exercises as pt progresses, 20th:  continue to update HEP; 08/17/21: continue to progress HEP when indicated.  Visit 40: adding new exercises as pt progresses   Time 12    Period Weeks    Status On-going    Target Date 01/31/22      OT LONG TERM GOAL #2   Title Pt will complete UB and LB dressing with modified independence including buttons, snaps and zippers.    Baseline requires min assist at eval, 10th visit:  occasional assist with buttons, 20th:  able to perform one handed, but difficulty with bilateral UE; 08/17/21: pt reports inconsistent with 1 hand, reviewed techniques this visit, Visit 40:  Pt requires assist with bra   Time 12    Period Weeks    Status MET   Target Date 11/08/21     OT LONG TERM GOAL #3   Title Pt will perform shower transfer with modified independence.    Baseline Pt requires supervision to min assist for shower transfer at home. 10th visit: supervision; 08/17/21: supv .  Visit 40:  Pt had met goal but had recent fall and now requiring min guard to supv   Time 6    Period Weeks    Status  Met    Target Date 11/08/21      OT LONG TERM GOAL #4   Title Pt will improve L hand grip by 10# to assist with holding items in left hand securely.    Baseline no grip in left hand at eval, 10th visit:  improved flexion but still working towards composite fisting and grip. 20th:  continues to demo decreased grip; 08/17/21: active digit flexion improving, but not yet able to register grip on dynamometer; 09/04/21: L grip 1# 7/17:  5#    Time 12    Period Weeks    Status On-going    Target Date 01/31/22      OT LONG TERM GOAL #5   Title Pt will improve left shoulder flexion to 100 degrees or better to improve reaching to obtain self care items from shelf/shoulder height.    Baseline difficulty with reach, shoulder flexion to 47 degrees; 08/17/21: L shoulder flexion 85, but not yet able to consistently hold ADL supplies in L hand when reaching; 09/04/21: flexion 85 7/17:  shoulder flexion to 90   Time 12    Period Weeks    Status On-going    Target Date 01/31/22      OT LONG TERM GOAL #6   Title Pt will improve FOTO score to 47 or above to demonstrate a clinically relevant change in function to impact ADL tasks.    Baseline score of 30 at eval; 08/17/21: FOTO: 42; 09/04/21: FOTO : 54 , FOTO 7/7:  60   Time 12    Period Weeks    Status  MET    Target Date 01/31/22      OT LONG TERM  GOAL #7   Title Pt will demonstrate ability to pick up small objects and complete 9 hole peg test in less than 2 mins.    Baseline unable to perform at eval, 10th visit: still unable to pick up small pegs, 20th: unable to pick up pegs but improving; 08/17/21: not attempted d/t time constraints, will assess next visit; 09/04/21: 9 hole peg test in 5 min 53 sec;  7/17:  Pt unable to complete this date but was able to place 6 of 9 pegs in 4 mins 20 secs   Time 12    Period Weeks    Status On-going    Target Date 01/31/2022             Plan -     Clinical Impression Statement Pt continues to progress with L grip strength.  Pt was able to complete 2 trials on 6.6# grip setting to remove jumbo pegs from pegboard, requiring cues for rest, technique, but no physical assist from OT; pt successfully moved ~75% of pegs without dropping.  Practiced LUE [place] skills bouncing, tossing, and 1 and 2 hand catching of small stress ball.  Pt was able to throw over and underhand using L.  Practiced initially catching ball with 2 hands, progressed to L hand only catching ball against torso, with steady progression to catching ball mid air with L hand cradling ball palm up.   Continue to work towards goals in plan of care to improve LUE ROM, coordination and functional use for daily tasks.    OT Occupational Profile and History Detailed Assessment- Review of Records and additional review of physical, cognitive, psychosocial history related to current functional performance    Occupational performance deficits (Please refer to evaluation for details): ADL's;IADL's;Leisure;Rest and Sleep    Body Structure / Function / Physical Skills ADL;Coordination;Endurance;GMC;UE functional use;Balance;IADL;Pain;Dexterity;FMC;Strength;Edema;Mobility;ROM    Psychosocial Skills Environmental  Adaptations;Habits;Routines and Behaviors    Rehab Potential Good    Clinical Decision Making Several treatment options, min-mod task  modification necessary   Comorbidities Affecting Occupational Performance: May have comorbidities impacting occupational performance    Modification or Assistance to Complete Evaluation  Min-Moderate modification of tasks or assist with assess necessary to complete eval    OT Frequency 2x / week    OT Duration 12 weeks    OT Treatment/Interventions Self-care/ADL training;Cryotherapy;Paraffin;Therapeutic exercise;DME and/or AE instruction;Functional Mobility Training;Balance training;Electrical Stimulation;Ultrasound;Neuromuscular education;Manual Therapy;Splinting;Moist Heat;Contrast Bath;Passive range of motion;Therapeutic activities;Patient/family education;Coping strategies training    Plan OT recert    Consulted and Agree with Plan of Care Patient            Leta Speller, MS, OTR/L  Darleene Cleaver, OT 12/12/2021, 9:21 PM

## 2021-12-13 ENCOUNTER — Ambulatory Visit: Payer: Medicare PPO

## 2021-12-13 ENCOUNTER — Ambulatory Visit: Payer: Medicare PPO | Admitting: Occupational Therapy

## 2021-12-13 DIAGNOSIS — R2681 Unsteadiness on feet: Secondary | ICD-10-CM

## 2021-12-13 DIAGNOSIS — G8929 Other chronic pain: Secondary | ICD-10-CM

## 2021-12-13 DIAGNOSIS — R278 Other lack of coordination: Secondary | ICD-10-CM

## 2021-12-13 DIAGNOSIS — M6281 Muscle weakness (generalized): Secondary | ICD-10-CM | POA: Diagnosis not present

## 2021-12-13 DIAGNOSIS — R262 Difficulty in walking, not elsewhere classified: Secondary | ICD-10-CM

## 2021-12-13 NOTE — Therapy (Signed)
OUTPATIENT PHYSICAL THERAPY LOWER EXTREMITY TREATMENT    Patient Name: Teresa [name] MRN: [id number] DOB:1940/08/15, 81 y.o., female Today's Date: 12/13/2021   PT End of Session - 12/13/21 1511     Visit Number 12    Number of Visits 25    Date for PT Re-Evaluation 01/24/22    PT Start Time 1150    PT Stop Time 1230    PT Time Calculation (min) 40 min    Equipment Utilized During Treatment Gait belt    Activity Tolerance Patient tolerated treatment well;No increased pain    Behavior During Therapy WFL for tasks assessed/performed                        Past Medical History:  Diagnosis Date   Cancer ([place])    skin   Hypothyroidism    Raynaud's disease    Past Surgical History:  Procedure Laterality Date   ABDOMINAL HYSTERECTOMY  1991   APPENDECTOMY  1991   BACK SURGERY  2010   Cervical fusion C4-5-6-7   CATARACT EXTRACTION, BILATERAL Bilateral 10/2016   CHOLECYSTECTOMY  1995   COLONOSCOPY WITH PROPOFOL N/A 12/08/2018   Procedure: COLONOSCOPY WITH PROPOFOL;  Surgeon: Toledo, Benay Pike, MD;  Location: [facility] ENDOSCOPY;  Service: Gastroenterology;  Laterality: N/A;   EYE SURGERY     Patient Active Problem List   Diagnosis Date Noted   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 Pt reports no recent falls falls. Presents with cut on L dorsal wrist from bumping into chair arm per reprt. She has a bandage on it, says it is healing  well. Pt reports no pain.  PAIN:  Are you having pain? Yes: NPRS scale: 0/10 Pain location: B shoulders Pain description: achey Aggravating factors: Sleeping on it Relieving factors: doing her exercises  PERTINENT HISTORY: On June 24th, 2023 pt fell pulling up her pajama pants at home, slipped, and fractured her R ankle (closed traumatic nondisplaced fracture of posterior malleolus of R tibia). Pt had been working with PT for R side MCA CVA (02/26/21). Pt now returns to PT. She is WBAT per chart (Dr. Doy Hutching). Pt uses a quad cane for ambulation. She is not wearing an ankle brace, but does report she wears a brace when out for longer periods of time (does not currently have it with her for exam). She reports she has difficulty with balance with turning and feels she is dragging her LLE. Pt reports she has no ankle pain. Feels her balance is generally off. She reports she has been moving her R ankle and walking on it for exercise. Pt is not aware of any other restrictions other than to wear her brace. Pt reports she was discharged from working with her physician for her ankle due to good progress.  Pt has not had any other falls since the one in June, just stumbling with turns. She continues to work with OT for her UE s/p CVA. PMH includes:  skin cancer, hypothyroidism, and Raynaud's disease, history of cervical fusion in 2010 C4-C7, R side MCA CVA 02/26/21.  PRECAUTIONS: Fall  WEIGHT BEARING RESTRICTIONS Yes WBAT, pt has R ankle brace but reports only wears for long-distance walking  FALLS:  Has patient fallen in last 6 months? Yes. Number of falls 1  LIVING ENVIRONMENT: Lives with: lives with their spouse Lives in: House/apartment Stairs: Yes: External: 1 steps; none Does have second floor, with handrail going up on L, but hasn't gone upstairs per report Has following equipment at home: Quad cane small base, Environmental consultant - 2 wheeled, Wheelchair (manual), and Grab bars   PLOF: Independent with  household mobility with device and reports husband has helped with bathing since stroke  PATIENT GOALS  Improve her balance with turning and not dragging her feet, "the left leg is dragging again"   OBJECTIVE:  Taken at eval unless indicated otherwise DIAGNOSTIC FINDINGS:  Information via chart - Scarlett Presto, PA 10/23/2021: "Xrays of the right ankle were ordered and interpreted 10/23/2021, with 3 views using AP, lateral, and mortise views. Xrays revealed the right ankle with a posterior malleolus nondisplaced fracture with no angulation or rotation. The ankle mortise is well-maintained. There is significant callus formation or fracture healing noted on today's x-rays."    PATIENT SURVEYS:  LEFS 44/80   FOTO: 47 (taken 11/06/2021)  COGNITION:  Overall cognitive status: Within functional limits for tasks assessed     SENSATION: WFL BLE, denies any n/t in BLE  EDEMA:  None observed currently, but does report swelling later in the day in her B ankles "just with the socks"  MUSCLE LENGTH: Hamstrings: Right -18 deg; Left -19 deg deg from neutral   POSTURE: rounded shoulders and increased thoracic kyphosis  PALPATION: Medial lower leg just superior to medial malleolus feels "bruised"  LOWER EXTREMITY ROM:  Active ROM Right eval Left eval  Ankle dorsiflexion 1 5  Ankle plantarflexion ~50 ~50   (Blank rows = not tested)  LOWER EXTREMITY MMT:   BLE grossly 4+/5 (*did not test R ankle musculature today)   FUNCTIONAL TESTS:  10 meter walk test: 0.83 m/s with QC  Berg: 51/56 taken 11/06/2021  TODAY'S TREATMENT:   NMR: Amb with SPC outside focus on balance while ambulating over thick grass x 10 minutes. Pt requires no more than CGA but reports low confidence in balance with activity and that she continues to feel unsteady.  Obstacle course: cones placed close together to create narrow path in order to promote pt's awareness of L side. Performed with and without dual  motor task with use of SPC, CGA. Pt fatigues quickly.  Ambulation over compliant surface (large red mat) without SPC, with CGA, FWD/BCKWD/LTL stepping. Pt reports feeling confident with intervention. Exhibits no LOB.  Pt then completes 2x30 of static stand on mat with EC, CGA.   TherEx: Interval training nustep lvl 2-5 , each level performed in 30-90 sec  for  total of x 7 minutes. PT provided adjustments to improve LUE positioning and comfort. Additionally PT adjusted intensity of intervention throughout as appropriate. Heat was donned to pt's B shoulder while pt performed intervention. Cuing for increased SPM (at least 80 SPM)    PATIENT EDUCATION:  Education details: Pt educated throughout session about proper posture and technique with exercises. Improved exercise technique, movement at target joints, use of target muscles after min to mod verbal, visual, tactile cues.  Person educated: Patient Education method: Explanation, Demonstration, and Verbal cues Education comprehension:  verbalized understanding, returned demonstration, and needs further education   HOME EXERCISE PROGRAM: no updates on this date, pt to continue HEP as previously given 8/15: Access Code: [email or web address] URL: [email or web address] Date: 11/06/2021 Prepared by: Ricard Dillon  Exercises - Supine Single Leg Ankle Pumps  - 1 x daily - 7 x weekly - 2 sets - 20 reps - Supine Active Straight Leg Raise  - 1 x daily - 5 x weekly - 2 sets - 15 reps  ASSESSMENT:  CLINICAL IMPRESSION:  Pt able to perform compliant surface training successfully on red mat without LOB and reported good balance confidence. However, when attempted outside over grass pt reported feeling unsteady and like she was going to fall, although she required no more than CGA. Pt also continues to fatigue quickly with interventions. The patient will benefit from further skilled PT to improve strength, balance and mobility.  OBJECTIVE  IMPAIRMENTS Abnormal gait, decreased activity tolerance, decreased balance, decreased coordination, decreased endurance, decreased mobility, difficulty walking, decreased ROM, decreased strength, hypomobility, increased edema, impaired flexibility, impaired UE functional use, improper body mechanics, postural dysfunction, and pain.   ACTIVITY LIMITATIONS carrying, lifting, standing, stairs, bed mobility, bathing, dressing, reach over head, hygiene/grooming, and locomotion level  PARTICIPATION LIMITATIONS: meal prep, cleaning, laundry, driving, shopping, community activity, and yard work  PERSONAL FACTORS Age, Past/current experiences, Sex, and 3+ comorbidities: hypothyroidism, and Raynaud's disease, history of cervical fusion in 2010 C4-C7, R side MCA CVA 02/26/21  are also affecting patient's functional outcome.   REHAB POTENTIAL: Good  CLINICAL DECISION MAKING: Stable/uncomplicated  EVALUATION COMPLEXITY: Moderate   GOALS: Goals reviewed with patient? Yes  SHORT TERM GOALS: Target date: 12/27/2021  Patient will be independent in home exercise program to improve strength/mobility for better functional independence with ADLs. Baseline: to be initiated next 1-2 sessions; 8/15: initiated; 9/14: Pt reports she indep Goal status: MET LONG TERM GOALS: Target date: 01/24/2022   Patient will increase FOTO score to equal to or greater than  63  to demonstrate statistically significant improvement in mobility and quality of life.  Baseline: 8/15: 47; 9/14: 66% Goal status: MET  2.   Patient will increase 10 meter walk test to >1.56ms as to improve gait speed for better community ambulation and to reduce fall risk. Baseline: 0.83 m/s with QC; 9/14: 0.92 m/s with QC, without AD 0.91 m/s Goal status: Partially MET  3.  Patient will increase BLE gross strength by 1/2 point on MMT as to improve functional strength for independent gait, increased standing tolerance and increased ADL  ability. Baseline: grossly 4+/5 BLE, with exception of R ankle testing deferred at eval; 9/15: grossly 4+/5 B including R ankle Goal status: IN PROGRESS  4.   Patient will demonstrate an improved Berg Balance Score of at least 3 points or greater as to demonstrate improved balance with ADLs such as sitting/standing and transfer balance and reduced fall risk.  Baseline: 8/15: 51; 9/14: 54/56 Goal status: MET  5.  Patient will increase lower extremity functional scale to >60/80 to demonstrate improved functional mobility and increased tolerance with ADLs.  Baseline: 44/80; 9/14: 50/80 Goal status: INITIAL   6.  Patient will increase six minute walk test distance to >1300 ft for improve gait ability and return to PLOF to complete her walks in her neighborhood.  Baseline: 1194 ft without AD, with SBA, pt reported feeling fatigued in hips around minutes 4 Goal status: INITIAL    PLAN: PT FREQUENCY: 2x/week  PT DURATION: 12 weeks  PLANNED  INTERVENTIONS: Therapeutic exercises, Therapeutic activity, Neuromuscular re-education, Balance training, Gait training, Patient/Family education, Self Care, Joint mobilization, Joint manipulation, Stair training, Vestibular training, Canalith repositioning, Visual/preceptual remediation/compensation, Orthotic/Fit training, DME instructions, Electrical stimulation, Spinal mobilization, Cryotherapy, Moist heat, Splintting, Taping, Ultrasound, Manual therapy, and Re-evaluation  PLAN FOR NEXT SESSION:  balance and strength training, gentle mobility R ankle, isometrics, gait training, continue plan   Zollie Pee, PT 12/13/2021, 3:22 PM

## 2021-12-15 ENCOUNTER — Encounter: Payer: Self-pay | Admitting: Occupational Therapy

## 2021-12-15 NOTE — Therapy (Signed)
OUTPATIENT OCCUPATIONAL THERAPY TREATMENT NOTE  Patient Name: Teresa [name] MRN: [id number] DOB:06/30/1940, 81 y.o., female Today's Date: 12/13/2021  PCP: Dr. [name] REFERRING PROVIDER: Dr. [name]   OT End of Session - 12/15/21 2023     Visit Number 64    Number of Visits 72    Date for OT Re-Evaluation 01/31/22    OT Start Time 1100    OT Stop Time 1145    OT Time Calculation (min) 45 min    Activity Tolerance Patient tolerated treatment well    Behavior During Therapy [facility] for tasks assessed/performed                     Past Medical History:  Diagnosis Date   Cancer ([place])    skin   Hypothyroidism    Raynaud's disease    Past Surgical History:  Procedure Laterality Date   [place]   BACK SURGERY  2010   Cervical fusion C4-5-6-7   CATARACT EXTRACTION, BILATERAL Bilateral 10/2016   CHOLECYSTECTOMY  1995   COLONOSCOPY WITH PROPOFOL N/A 12/08/2018   Procedure: COLONOSCOPY WITH PROPOFOL;  Surgeon: Toledo, Benay Pike, MD;  Location: [facility] ENDOSCOPY;  Service: Gastroenterology;  Laterality: N/A;   EYE SURGERY     Patient Active Problem List   Diagnosis Date Noted   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  Following inpt rehab, pt went home and had home health.   PRECAUTIONS: fall  SUBJECTIVE:   "Did Teresa [name] tell you we tossed the ball last time?"  Pt reports she has pain in her left upper  trapezius today with tightness in neck and to the arm.  PAIN:  Are you having pain? Yes: NPRS scale: 3/10 Pain location:   upper trap Pain description: tightness, sore, tender Aggravating factors: N/A Relieving factors: rest, heat, otc pain meds   OBJECTIVE:  TODAY'S TREATMENT:  Manual Therapy: Therapist performing manual stretch to left upper trap along muscle to insertion, manual massage to upper trap and along neckline to base of skull, trigger point release to decrease pain and increase motion.  Pt assisting with stretch with head turn to right and tilt down of chin.  Manual therapy techniques performed prior to ADL task.   ADL: Pt seen for manipulation of a variety sized pill bottles with child proof safety lids, cues for utilizing left hand for manipulating and turn top of lid from bottle.  Pt manipulating larger pill sized pieces to pick up and place into bottle as well as remove, cues for prehension pattern for tip to tip.  Decrease in size to a simulated caplet size pill, able to pick up with focus and effort.  Smallest sized pills 1/4 inch in size were too difficulty to pick up from tabletop.     PATIENT EDUCATION: Education details: LUE [place] skills Person educated: pt Education method: Explanation and Verbal cues Education comprehension: verbalized understanding   HOME  EXERCISE PROGRAM Continue to engage LUE into ADLs; continue gripping and pinching exercises for LUE, and L shoulder AROM/AAROM, crochet , typing    OT Long Term Goals -       OT LONG TERM GOAL #1   Title Pt will be independent with home exercise program.    Baseline Eval: no current program, 10th visit:  continue to add new exercises as pt progresses, 20th:  continue to update HEP; 08/17/21: continue to progress HEP when indicated.  Visit 40: adding new exercises as pt progresses   Time 12    Period Weeks    Status On-going    Target Date 01/31/22      OT LONG TERM GOAL #2   Title Pt will complete UB  and LB dressing with modified independence including buttons, snaps and zippers.    Baseline requires min assist at eval, 10th visit: occasional assist with buttons, 20th:  able to perform one handed, but difficulty with bilateral UE; 08/17/21: pt reports inconsistent with 1 hand, reviewed techniques this visit, Visit 40:  Pt requires assist with bra   Time 12    Period Weeks    Status MET   Target Date 11/08/21     OT LONG TERM GOAL #3   Title Pt will perform shower transfer with modified independence.    Baseline Pt requires supervision to min assist for shower transfer at home. 10th visit: supervision; 08/17/21: supv .  Visit 40:  Pt had met goal but had recent fall and now requiring min guard to supv   Time 6    Period Weeks    Status  Met    Target Date 11/08/21      OT LONG TERM GOAL #4   Title Pt will improve L hand grip by 10# to assist with holding items in left hand securely.    Baseline no grip in left hand at eval, 10th visit:  improved flexion but still working towards composite fisting and grip. 20th:  continues to demo decreased grip; 08/17/21: active digit flexion improving, but not yet able to register grip on dynamometer; 09/04/21: L grip 1# 7/17:  5#    Time 12    Period Weeks    Status On-going    Target Date 01/31/22      OT LONG TERM GOAL #5   Title Pt will improve left shoulder flexion to 100 degrees or better to improve reaching to obtain self care items from shelf/shoulder height.    Baseline difficulty with reach, shoulder flexion to 47 degrees; 08/17/21: L shoulder flexion 85, but not yet able to consistently hold ADL supplies in L hand when reaching; 09/04/21: flexion 85 7/17:  shoulder flexion to 90   Time 12    Period Weeks    Status On-going    Target Date 01/31/22      OT LONG TERM GOAL #6   Title Pt will improve FOTO score to 47 or above to demonstrate a clinically relevant change in function to impact ADL tasks.    Baseline score of 30 at eval; 08/17/21:  FOTO: 42; 09/04/21: FOTO : 54 , FOTO 7/7:  60   Time 12    Period Weeks    Status  MET    Target Date 01/31/22      OT LONG TERM GOAL #7   Title Pt will demonstrate ability to pick up small objects and complete 9 hole peg test in less than 2 mins.    Baseline  unable to perform at eval, 10th visit: still unable to pick up small pegs, 20th: unable to pick up pegs but improving; 08/17/21: not attempted d/t time constraints, will assess next visit; 09/04/21: 9 hole peg test in 5 min 53 sec;  7/17:  Pt unable to complete this date but was able to place 6 of 9 pegs in 4 mins 20 secs   Time 12    Period Weeks    Status On-going    Target Date 01/31/2022             Plan -     Clinical Impression Statement Decreased pain in left UE shoulder, neck and upper trapezius after manual skills to release trigger point and stretch area to reduce tension and restore muscle fibers and to allow increased ROM with reaching and hand use.  Pt able to manipulate child safety lids on pill bottles this date successfully, difficulty with smallest size of pills to pick up from table or retrieve from bottle.  Pt performed well with caplet sized pills and larger pill sizes.  Continue to work towards goals in plan of care to improve left UE function, decrease pain and increase active normal movement patterns for daily tasks.    OT Occupational Profile and History Detailed Assessment- Review of Records and additional review of physical, cognitive, psychosocial history related to current functional performance    Occupational performance deficits (Please refer to evaluation for details): ADL's;IADL's;Leisure;Rest and Sleep    Body Structure / Function / Physical Skills ADL;Coordination;Endurance;GMC;UE functional use;Balance;IADL;Pain;Dexterity;FMC;Strength;Edema;Mobility;ROM    Psychosocial Skills Environmental  Adaptations;Habits;Routines and Behaviors    Rehab Potential Good    Clinical Decision Making Several treatment  options, min-mod task modification necessary   Comorbidities Affecting Occupational Performance: May have comorbidities impacting occupational performance    Modification or Assistance to Complete Evaluation  Min-Moderate modification of tasks or assist with assess necessary to complete eval    OT Frequency 2x / week    OT Duration 12 weeks    OT Treatment/Interventions Self-care/ADL training;Cryotherapy;Paraffin;Therapeutic exercise;DME and/or AE instruction;Functional Mobility Training;Balance training;Electrical Stimulation;Ultrasound;Neuromuscular education;Manual Therapy;Splinting;Moist Heat;Contrast Bath;Passive range of motion;Therapeutic activities;Patient/family education;Coping strategies training    Plan OT recert    Consulted and Agree with Plan of Care Patient            Seairra Otani Colette Ribas, OT 12/15/2021, 8:24 PM

## 2021-12-17 NOTE — Therapy (Signed)
OUTPATIENT PHYSICAL THERAPY LOWER EXTREMITY TREATMENT    Patient Name: Teresa [name] MRN: [id number] DOB:Aug 12, 1940, 81 y.o., female Today's Date: 12/18/2021   PT End of Session - 12/18/21 1152     Visit Number 13    Number of Visits 25    Date for PT Re-Evaluation 01/24/22    PT Start Time 1103    PT Stop Time 1145    PT Time Calculation (min) 42 min    Equipment Utilized During Treatment Gait belt    Activity Tolerance Patient tolerated treatment well    Behavior During Therapy WFL for tasks assessed/performed                         Past Medical History:  Diagnosis Date   Cancer ([place])    skin   Hypothyroidism    Raynaud's disease    Past Surgical History:  Procedure Laterality Date   [place]   BACK SURGERY  2010   Cervical fusion C4-5-6-7   CATARACT EXTRACTION, BILATERAL Bilateral 10/2016   CHOLECYSTECTOMY  1995   COLONOSCOPY WITH PROPOFOL N/A 12/08/2018   Procedure: COLONOSCOPY WITH PROPOFOL;  Surgeon: Toledo, Benay Pike, MD;  Location: [facility] ENDOSCOPY;  Service: Gastroenterology;  Laterality: N/A;   EYE SURGERY     Patient Active Problem List   Diagnosis Date Noted   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 Pt has upcoming appointment with Dr Doy Hutching. Pt reports when she sits for long periods of time she "aches all over."  Pt also reports still experiencing LBP when standing up. Pt reports she again went up and down her stairs in her home  without issue. Her husband was with her when doing this.   PAIN:  Are you having pain? Yes: NPRS scale: 0/10 Pain location: B shoulders Pain description: achey Aggravating factors: Sleeping on it Relieving factors: doing her exercises  PERTINENT HISTORY: On June 24th, 2023 pt fell pulling up her pajama pants at home, slipped, and fractured her R ankle (closed traumatic nondisplaced fracture of posterior malleolus of R tibia). Pt had been working with PT for R side MCA CVA (02/26/21). Pt now returns to PT. She is WBAT per chart (Dr. Doy Hutching). Pt uses a quad cane for ambulation. She is not wearing an ankle brace, but does report she wears a brace when out for longer periods of time (does not currently have it with her for exam). She reports she has difficulty with balance with turning and feels she is dragging her LLE. Pt reports she has no ankle pain. Feels her balance is generally off. She reports she has been moving her R ankle and walking on it for exercise. Pt is not aware of any other restrictions other than to wear her brace. Pt reports she was discharged from working with her physician for her ankle due to good progress.  Pt has not had any other falls since the one in June, just stumbling with turns. She continues to work with  OT for her UE s/p CVA. PMH includes: skin cancer, hypothyroidism, and Raynaud's disease, history of cervical fusion in 2010 C4-C7, R side MCA CVA 02/26/21.  PRECAUTIONS: Fall  WEIGHT BEARING RESTRICTIONS Yes WBAT, pt has R ankle brace but reports only wears for long-distance walking  FALLS:  Has patient fallen in last 6 months? Yes. Number of falls 1  LIVING ENVIRONMENT: Lives with: lives with their spouse Lives in: House/apartment Stairs: Yes: External: 1 steps; none Does have second floor, with handrail going up on L, but hasn't gone upstairs per report Has following equipment at home: Quad cane small base, Environmental consultant - 2 wheeled, Wheelchair (manual), and Grab  bars   PLOF: Independent with household mobility with device and reports husband has helped with bathing since stroke  PATIENT GOALS  Improve her balance with turning and not dragging her feet, "the left leg is dragging again"   OBJECTIVE:  Taken at eval unless indicated otherwise DIAGNOSTIC FINDINGS:  Information via chart - Scarlett Presto, PA 10/23/2021: "Xrays of the right ankle were ordered and interpreted 10/23/2021, with 3 views using AP, lateral, and mortise views. Xrays revealed the right ankle with a posterior malleolus nondisplaced fracture with no angulation or rotation. The ankle mortise is well-maintained. There is significant callus formation or fracture healing noted on today's x-rays."    PATIENT SURVEYS:  LEFS 44/80   FOTO: 47 (taken 11/06/2021)  COGNITION:  Overall cognitive status: Within functional limits for tasks assessed     SENSATION: WFL BLE, denies any n/t in BLE  EDEMA:  None observed currently, but does report swelling later in the day in her B ankles "just with the socks"  MUSCLE LENGTH: Hamstrings: Right -18 deg; Left -19 deg deg from neutral   POSTURE: rounded shoulders and increased thoracic kyphosis  PALPATION: Medial lower leg just superior to medial malleolus feels "bruised"  LOWER EXTREMITY ROM:  Active ROM Right eval Left eval  Ankle dorsiflexion 1 5  Ankle plantarflexion ~50 ~50   (Blank rows = not tested)  LOWER EXTREMITY MMT:   BLE grossly 4+/5 (*did not test R ankle musculature today)   FUNCTIONAL TESTS:  10 meter walk test: 0.83 m/s with QC  Berg: 51/56 taken 11/06/2021  TODAY'S TREATMENT:  Seated stability ball rollouts FWD/BCKWD/LTL  x multiple reps of each  3# AWs donned: seated LAQ 15x, 10x B Marches 2x20 alt LE Comments: rates fatiguing   Heat donned to low back while pt reports the following (reports nothing currently on/applied to her back that would prohibit use of heat): Interval training nustep lvls  2-5 , each level performed in 30-90 sec intervals  for total of x 7 minutes.  PT adjusted intensity of intervention throughout as appropriate.Cuing for increased SPM (at least 60 SPM). Pt completes 0.18 miles    Seated heel raises 20x B  Seated toe raises/DF 20x B  Standing heel raises 20x B Standing toe raises 20x B  In // bars: Standing penguin walks for DF x multiple reps with UE support  Standing heel raise walks x multiple reps with UE support   STS 10x hands-free    PATIENT EDUCATION:  Education details: Pt educated throughout session about proper posture and technique with exercises. Improved exercise technique, movement at target joints, use of target muscles after min to mod verbal, visual, tactile cues.  Person educated: Patient Education method: Explanation, Demonstration, and Verbal cues Education comprehension: verbalized understanding, returned demonstration, and needs further education   HOME EXERCISE PROGRAM:  no updates on this date, pt to continue HEP as previously given 8/15: Access Code: [email or web address] URL: [email or web address] Date: 11/06/2021 Prepared by: Temple Pacini  Exercises - Supine Single Leg Ankle Pumps  - 1 x daily - 7 x weekly - 2 sets - 20 reps - Supine Active Straight Leg Raise  - 1 x daily - 5 x weekly - 2 sets - 15 reps  ASSESSMENT:  CLINICAL IMPRESSION:  Pt with excellent motivation to participate in session. Her chronic LBP continues to somewhat limit her mobility, so heat was used with therex to improve comfort with interventions. Despite low back discomfort, pt was able to complete numerous standing and seated strengthening interventions as well as nustep endurance interval training. Will continue to focus on increasing endurance in particular, as this is one of pt's main deficits. Pt also continues to fatigue quickly with interventions. The patient will benefit from further skilled PT to improve strength, balance and  mobility.  OBJECTIVE IMPAIRMENTS Abnormal gait, decreased activity tolerance, decreased balance, decreased coordination, decreased endurance, decreased mobility, difficulty walking, decreased ROM, decreased strength, hypomobility, increased edema, impaired flexibility, impaired UE functional use, improper body mechanics, postural dysfunction, and pain.   ACTIVITY LIMITATIONS carrying, lifting, standing, stairs, bed mobility, bathing, dressing, reach over head, hygiene/grooming, and locomotion level  PARTICIPATION LIMITATIONS: meal prep, cleaning, laundry, driving, shopping, community activity, and yard work  PERSONAL FACTORS Age, Past/current experiences, Sex, and 3+ comorbidities: hypothyroidism, and Raynaud's disease, history of cervical fusion in 2010 C4-C7, R side MCA CVA 02/26/21  are also affecting patient's functional outcome.   REHAB POTENTIAL: Good  CLINICAL DECISION MAKING: Stable/uncomplicated  EVALUATION COMPLEXITY: Moderate   GOALS: Goals reviewed with patient? Yes  SHORT TERM GOALS: Target date: 12/27/2021  Patient will be independent in home exercise program to improve strength/mobility for better functional independence with ADLs. Baseline: to be initiated next 1-2 sessions; 8/15: initiated; 9/14: Pt reports she indep Goal status: MET LONG TERM GOALS: Target date: 01/24/2022   Patient will increase FOTO score to equal to or greater than  63  to demonstrate statistically significant improvement in mobility and quality of life.  Baseline: 8/15: 47; 9/14: 66% Goal status: MET  2.   Patient will increase 10 meter walk test to >1.38m/s as to improve gait speed for better community ambulation and to reduce fall risk. Baseline: 0.83 m/s with QC; 9/14: 0.92 m/s with QC, without AD 0.91 m/s Goal status: Partially MET  3.  Patient will increase BLE gross strength by 1/2 point on MMT as to improve functional strength for independent gait, increased standing tolerance and increased  ADL ability. Baseline: grossly 4+/5 BLE, with exception of R ankle testing deferred at eval; 9/15: grossly 4+/5 B including R ankle Goal status: IN PROGRESS  4.   Patient will demonstrate an improved Berg Balance Score of at least 3 points or greater as to demonstrate improved balance with ADLs such as sitting/standing and transfer balance and reduced fall risk.  Baseline: 8/15: 51; 9/14: 54/56 Goal status: MET  5.  Patient will increase lower extremity functional scale to >60/80 to demonstrate improved functional mobility and increased tolerance with ADLs.  Baseline: 44/80; 9/14: 50/80 Goal status: INITIAL   6.  Patient will increase six minute walk test distance to >1300 ft for improve gait ability and return to PLOF to complete her walks in her neighborhood.  Baseline: 1194 ft without AD, with SBA, pt reported feeling fatigued in hips around minutes 4 Goal status: INITIAL  PLAN: PT FREQUENCY: 2x/week  PT DURATION: 12 weeks  PLANNED INTERVENTIONS: Therapeutic exercises, Therapeutic activity, Neuromuscular re-education, Balance training, Gait training, Patient/Family education, Self Care, Joint mobilization, Joint manipulation, Stair training, Vestibular training, [place] repositioning, Visual/preceptual remediation/compensation, Orthotic/Fit training, DME instructions, Electrical stimulation, Spinal mobilization, Cryotherapy, Moist heat, Splintting, Taping, Ultrasound, Manual therapy, and Re-evaluation  PLAN FOR NEXT SESSION:  balance and strength training, gentle mobility R ankle, isometrics, gait training, continue plan   Zollie Pee, PT 12/18/2021, 12:00 PM

## 2021-12-18 ENCOUNTER — Ambulatory Visit: Payer: Medicare PPO

## 2021-12-18 DIAGNOSIS — R278 Other lack of coordination: Secondary | ICD-10-CM

## 2021-12-18 DIAGNOSIS — I63511 Cerebral infarction due to unspecified occlusion or stenosis of right middle cerebral artery: Secondary | ICD-10-CM

## 2021-12-18 DIAGNOSIS — M6281 Muscle weakness (generalized): Secondary | ICD-10-CM

## 2021-12-18 DIAGNOSIS — R2681 Unsteadiness on feet: Secondary | ICD-10-CM

## 2021-12-18 DIAGNOSIS — M5459 Other low back pain: Secondary | ICD-10-CM

## 2021-12-19 ENCOUNTER — Ambulatory Visit: Payer: Medicare PPO

## 2021-12-19 NOTE — Therapy (Signed)
OUTPATIENT OCCUPATIONAL THERAPY TREATMENT NOTE  Patient Name: Teresa [name] MRN: [id number] DOB:1941/03/13, 81 y.o., female Today's Date: 12/13/2021  PCP: Dr. [name] REFERRING PROVIDER: Dr. [name]   OT End of Session - 12/19/21 1208     Visit Number 61    Number of Visits 56    Date for OT Re-Evaluation 01/31/22    OT Start Time 9030    OT Stop Time 1230    OT Time Calculation (min) 45 min    Activity Tolerance Patient tolerated treatment well    Behavior During Therapy [facility] 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  Following inpt rehab, pt went home and had home health.   PRECAUTIONS: fall  SUBJECTIVE:  Pt reports doing well today.  She celebrated her birthday over the weekend.  PAIN:  Are  you having pain? Yes: NPRS scale: 3/10 Pain location:   upper trap Pain description: tightness, sore, tender Aggravating factors: N/A Relieving factors: rest, heat, otc pain meds   OBJECTIVE:  TODAY'S TREATMENT:  Therapeutic Exercise: Performed passive stretching for L shoulder flex/abd/horiz abd/ER, working to increase shoulder mobility for ADLs.  Performed passive wrist and digit flex/ext stretch in prep for grasp/release activities.  Completed L grip strengthening with hand gripper at light resistance using 1 red band for 3 sets 10 reps each.    Neuro re-ed: Practiced LUE [place] skills bouncing, tossing, and 1 and 2 hand catching of tennis ball.  Pt was able to throw over and underhand using L.  Practiced initially catching ball with 2 hands, progressed to L hand only catching ball against torso, with steady progression to catching ball mid air with L hand cradling ball palm up.  Close supv for dynamic standing tasks.  Facilitated L hand dexterity skills working to pick up [place] stones from dish 1 at a time and moving to neighboring dish.  Made multiple attempts to store 1 in palm while picking up another, but stone in palm would slide through ulnar side of palm d/t pt unable to fully close L hand SF and RF against palm with a tight enough grasp to store items in palm.  Practiced scooping a handful from a dish and storing in  palm; pt able to store up to 4 stones when digits 2-4 helped with storage.     PATIENT EDUCATION: Education details: LUE [place] skills Person educated: pt Education method: Explanation and Verbal cues Education comprehension: verbalized understanding   HOME EXERCISE PROGRAM Continue to engage LUE into ADLs; continue gripping and pinching exercises for LUE, and L shoulder AROM/AAROM, crochet , typing    OT Long Term Goals -       OT LONG TERM GOAL #1   Title Pt will be independent with home exercise program.    Baseline Eval: no current program, 10th visit:  continue  to add new exercises as pt progresses, 20th:  continue to update HEP; 08/17/21: continue to progress HEP when indicated.  Visit 40: adding new exercises as pt progresses   Time 12    Period Weeks    Status On-going    Target Date 01/31/22      OT LONG TERM GOAL #2   Title Pt will complete UB and LB dressing with modified independence including buttons, snaps and zippers.    Baseline requires min assist at eval, 10th visit: occasional assist with buttons, 20th:  able to perform one handed, but difficulty with bilateral UE; 08/17/21: pt reports inconsistent with 1 hand, reviewed techniques this visit, Visit 40:  Pt requires assist with bra   Time 12    Period Weeks    Status MET   Target Date 11/08/21     OT LONG TERM GOAL #3   Title Pt will perform shower transfer with modified independence.    Baseline Pt requires supervision to min assist for shower transfer at home. 10th visit: supervision; 08/17/21: supv .  Visit 40:  Pt had met goal but had recent fall and now requiring min guard to supv   Time 6    Period Weeks    Status  Met    Target Date 11/08/21      OT LONG TERM GOAL #4   Title Pt will improve L hand grip by 10# to assist with holding items in left hand securely.    Baseline no grip in left hand at eval, 10th visit:  improved flexion but still working towards composite fisting and grip. 20th:  continues to demo decreased grip; 08/17/21: active digit flexion improving, but not yet able to register grip on dynamometer; 09/04/21: L grip 1# 7/17:  5#    Time 12    Period Weeks    Status On-going    Target Date 01/31/22      OT LONG TERM GOAL #5   Title Pt will improve left shoulder flexion to 100 degrees or better to improve reaching to obtain self care items from shelf/shoulder height.    Baseline difficulty with reach, shoulder flexion to 47 degrees; 08/17/21: L shoulder flexion 85, but not yet able to consistently hold ADL supplies in L hand when reaching; 09/04/21: flexion 85 7/17:   shoulder flexion to 90   Time 12    Period Weeks    Status On-going    Target Date 01/31/22      OT LONG TERM GOAL #6   Title Pt will improve FOTO score to 47 or above to demonstrate a clinically relevant change in function to impact ADL tasks.    Baseline score of 30 at eval; 08/17/21: FOTO: 42; 09/04/21: FOTO : 54 , FOTO 7/7:  60   Time 12    Period Weeks    Status  MET  Target Date 01/31/22      OT LONG TERM GOAL #7   Title Pt will demonstrate ability to pick up small objects and complete 9 hole peg test in less than 2 mins.    Baseline unable to perform at eval, 10th visit: still unable to pick up small pegs, 20th: unable to pick up pegs but improving; 08/17/21: not attempted d/t time constraints, will assess next visit; 09/04/21: 9 hole peg test in 5 min 53 sec;  7/17:  Pt unable to complete this date but was able to place 6 of 9 pegs in 4 mins 20 secs   Time 12    Period Weeks    Status On-going    Target Date 01/31/2022             Plan -     Clinical Impression Statement Pt tolerated therapeutic exercises and neuro re-ed activities well this date.  Pt continues to progress with [place] skills using LUE to bounce, throw, and catch a tennis ball.  Focused also on L hand dexterity skills.  Pt made multiple attempts to store 1 [place] stone in palm while picking up another, but stone in palm would slide through ulnar side of palm d/t pt unable to fully close L hand SF and RF against palm with a tight enough grasp to store items in palm.  Practiced scooping a handful from a dish and storing in palm; pt able to store up to 4 stones when digits 2-4 helped with storage.  Continue to work towards goals in plan of care to improve left UE function, decrease pain and increase active normal movement patterns for daily tasks.    OT Occupational Profile and History Detailed Assessment- Review of Records and additional review of physical, cognitive, psychosocial history related to current  functional performance    Occupational performance deficits (Please refer to evaluation for details): ADL's;IADL's;Leisure;Rest and Sleep    Body Structure / Function / Physical Skills ADL;Coordination;Endurance;GMC;UE functional use;Balance;IADL;Pain;Dexterity;FMC;Strength;Edema;Mobility;ROM    Psychosocial Skills Environmental  Adaptations;Habits;Routines and Behaviors    Rehab Potential Good    Clinical Decision Making Several treatment options, min-mod task modification necessary   Comorbidities Affecting Occupational Performance: May have comorbidities impacting occupational performance    Modification or Assistance to Complete Evaluation  Min-Moderate modification of tasks or assist with assess necessary to complete eval    OT Frequency 2x / week    OT Duration 12 weeks    OT Treatment/Interventions Self-care/ADL training;Cryotherapy;Paraffin;Therapeutic exercise;DME and/or AE instruction;Functional Mobility Training;Balance training;Electrical Stimulation;Ultrasound;Neuromuscular education;Manual Therapy;Splinting;Moist Heat;Contrast Bath;Passive range of motion;Therapeutic activities;Patient/family education;Coping strategies training    Plan OT recert    Consulted and Agree with Plan of Care Patient            Leta Speller, MS, OTR/L  Darleene Cleaver, OT 12/19/2021, 12:10 PM

## 2021-12-20 ENCOUNTER — Encounter: Payer: Self-pay | Admitting: Occupational Therapy

## 2021-12-20 ENCOUNTER — Ambulatory Visit: Payer: Medicare PPO

## 2021-12-20 ENCOUNTER — Ambulatory Visit: Payer: Medicare PPO | Admitting: Occupational Therapy

## 2021-12-20 DIAGNOSIS — R2681 Unsteadiness on feet: Secondary | ICD-10-CM

## 2021-12-20 DIAGNOSIS — R278 Other lack of coordination: Secondary | ICD-10-CM

## 2021-12-20 DIAGNOSIS — M6281 Muscle weakness (generalized): Secondary | ICD-10-CM | POA: Diagnosis not present

## 2021-12-20 DIAGNOSIS — M5459 Other low back pain: Secondary | ICD-10-CM

## 2021-12-20 DIAGNOSIS — R262 Difficulty in walking, not elsewhere classified: Secondary | ICD-10-CM

## 2021-12-20 NOTE — Therapy (Signed)
OUTPATIENT PHYSICAL THERAPY LOWER EXTREMITY TREATMENT    Patient Name: Teresa [name] MRN: [id number] DOB:Mar 09, 1941, 81 y.o., female Today's Date: 12/20/2021   PT End of Session - 12/20/21 1516     Visit Number 14    Number of Visits 25    Date for PT Re-Evaluation 01/24/22    PT Start Time 1150    PT Stop Time 1226    PT Time Calculation (min) 36 min    Equipment Utilized During Treatment Gait belt    Activity Tolerance Patient tolerated treatment well    Behavior During Therapy WFL for tasks assessed/performed                          Past Medical History:  Diagnosis Date   Cancer ([place])    skin   Hypothyroidism    Raynaud's disease    Past Surgical History:  Procedure Laterality Date   [place]   BACK SURGERY  2010   Cervical fusion C4-5-6-7   CATARACT EXTRACTION, BILATERAL Bilateral 10/2016   CHOLECYSTECTOMY  1995   COLONOSCOPY WITH PROPOFOL N/A 12/08/2018   Procedure: COLONOSCOPY WITH PROPOFOL;  Surgeon: Toledo, Benay Pike, MD;  Location: [facility] ENDOSCOPY;  Service: Gastroenterology;  Laterality: N/A;   EYE SURGERY     Patient Active Problem List   Diagnosis Date Noted   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 Pt reports B shoulder pain currently a 5/10.  Pt is having issues with urinary urgency. She reports this started about a couple months ago. She does not always make it to the bathroom on  time. She has upcoming physical.   PAIN:  Are you having pain? Yes: NPRS scale: 5/10 Pain location: B shoulders Pain description: achey Aggravating factors: Sleeping on it Relieving factors: doing her exercises  PERTINENT HISTORY: On June 24th, 2023 pt fell pulling up her pajama pants at home, slipped, and fractured her R ankle (closed traumatic nondisplaced fracture of posterior malleolus of R tibia). Pt had been working with PT for R side MCA CVA (02/26/21). Pt now returns to PT. She is WBAT per chart (Dr. Doy Hutching). Pt uses a quad cane for ambulation. She is not wearing an ankle brace, but does report she wears a brace when out for longer periods of time (does not currently have it with her for exam). She reports she has difficulty with balance with turning and feels she is dragging her LLE. Pt reports she has no ankle pain. Feels her balance is generally off. She reports she has been moving her R ankle and walking on it for exercise. Pt is not aware of any other restrictions other than to wear her brace. Pt reports she was discharged from working with her physician for her ankle due to good progress.  Pt has not had any other falls since the one in June, just stumbling with turns. She continues to work with OT for her UE s/p CVA.  PMH includes: skin cancer, hypothyroidism, and Raynaud's disease, history of cervical fusion in 2010 C4-C7, R side MCA CVA 02/26/21.  PRECAUTIONS: Fall  WEIGHT BEARING RESTRICTIONS Yes WBAT, pt has R ankle brace but reports only wears for long-distance walking  FALLS:  Has patient fallen in last 6 months? Yes. Number of falls 1  LIVING ENVIRONMENT: Lives with: lives with their spouse Lives in: House/apartment Stairs: Yes: External: 1 steps; none Does have second floor, with handrail going up on L, but hasn't gone upstairs per report Has following equipment at home: Quad cane small base, Environmental consultant - 2 wheeled, Wheelchair (manual), and Grab bars   PLOF: Independent with  household mobility with device and reports husband has helped with bathing since stroke  PATIENT GOALS  Improve her balance with turning and not dragging her feet, "the left leg is dragging again"   OBJECTIVE:  Taken at eval unless indicated otherwise DIAGNOSTIC FINDINGS:  Information via chart - Scarlett Presto, PA 10/23/2021: "Xrays of the right ankle were ordered and interpreted 10/23/2021, with 3 views using AP, lateral, and mortise views. Xrays revealed the right ankle with a posterior malleolus nondisplaced fracture with no angulation or rotation. The ankle mortise is well-maintained. There is significant callus formation or fracture healing noted on today's x-rays."    PATIENT SURVEYS:  LEFS 44/80   FOTO: 47 (taken 11/06/2021)  COGNITION:  Overall cognitive status: Within functional limits for tasks assessed     SENSATION: WFL BLE, denies any n/t in BLE  EDEMA:  None observed currently, but does report swelling later in the day in her B ankles "just with the socks"  MUSCLE LENGTH: Hamstrings: Right -18 deg; Left -19 deg deg from neutral   POSTURE: rounded shoulders and increased thoracic kyphosis  PALPATION: Medial lower leg just superior to medial malleolus feels "bruised"  LOWER EXTREMITY ROM:  Active ROM Right eval Left eval  Ankle dorsiflexion 1 5  Ankle plantarflexion ~50 ~50   (Blank rows = not tested)  LOWER EXTREMITY MMT:   BLE grossly 4+/5 (*did not test R ankle musculature today)   FUNCTIONAL TESTS:  10 meter walk test: 0.83 m/s with QC  Berg: 51/56 taken 11/06/2021  TODAY'S TREATMENT:  Nustep interval training with heat donned to low back (pt reports nothing applied to or on low back to prohibit safe use of heat). Report heat feels good to low back. Warm up x 1 min 30 sec at lvl 1 Lvls 2-5 completed in intervals of 30-60 sec. Pt completes 9 min total and 0.23 miles  Amb. on treadmill with incline at 2%, 0.9-1.0 mph with BUE support x 4  minutes. Rates hard and fatigued by end of intervention.  Seated RLE ankle inv 20x with YTB, 20x GTB  GTB ankle EV 2x20 B  Seated GTB  DF 20x each LE   Standing heel raises 2x20 Standing hip abd 20x B  PATIENT EDUCATION:  Education details: Pt educated throughout session about proper posture and technique with exercises. Improved exercise technique, movement at target joints, use of target muscles after min to mod verbal, visual, tactile cues.  Person educated: Patient Education method: Explanation, Demonstration, and Verbal cues Education comprehension: verbalized understanding, returned demonstration, and needs further education   HOME EXERCISE PROGRAM: no updates on this date, pt to continue HEP as previously given 8/15: Access Code: [email or web address] URL: [email or web address] Date: 11/06/2021 Prepared by: Ricard Dillon  Exercises - Supine Single Leg Ankle Pumps  - 1 x daily - 7 x  weekly - 2 sets - 20 reps - Supine Active Straight Leg Raise  - 1 x daily - 5 x weekly - 2 sets - 15 reps  ASSESSMENT:  CLINICAL IMPRESSION:  Pt able to perform endurance interventions for longer, and with increased speed on treadmill at 2% incline. This indicates improvement in this area and progress toward goals. During session pt reported she has been dealing with urinary urgency and not always making it to the bathroom on time. This started about 2 mo ago per report. PT did suggest pt may possibly benefit from pelvic floor PT referral. Pt has physical soon with her primary care physician as well. The patient will benefit from further skilled PT to improve strength, balance and mobility to further improve deficits and make progress toward goals.  OBJECTIVE IMPAIRMENTS Abnormal gait, decreased activity tolerance, decreased balance, decreased coordination, decreased endurance, decreased mobility, difficulty walking, decreased ROM, decreased strength, hypomobility, increased edema, impaired  flexibility, impaired UE functional use, improper body mechanics, postural dysfunction, and pain.   ACTIVITY LIMITATIONS carrying, lifting, standing, stairs, bed mobility, bathing, dressing, reach over head, hygiene/grooming, and locomotion level  PARTICIPATION LIMITATIONS: meal prep, cleaning, laundry, driving, shopping, community activity, and yard work  PERSONAL FACTORS Age, Past/current experiences, Sex, and 3+ comorbidities: hypothyroidism, and Raynaud's disease, history of cervical fusion in 2010 C4-C7, R side MCA CVA 02/26/21  are also affecting patient's functional outcome.   REHAB POTENTIAL: Good  CLINICAL DECISION MAKING: Stable/uncomplicated  EVALUATION COMPLEXITY: Moderate   GOALS: Goals reviewed with patient? Yes  SHORT TERM GOALS: Target date: 12/27/2021  Patient will be independent in home exercise program to improve strength/mobility for better functional independence with ADLs. Baseline: to be initiated next 1-2 sessions; 8/15: initiated; 9/14: Pt reports she indep Goal status: MET LONG TERM GOALS: Target date: 01/24/2022   Patient will increase FOTO score to equal to or greater than  63  to demonstrate statistically significant improvement in mobility and quality of life.  Baseline: 8/15: 47; 9/14: 66% Goal status: MET  2.   Patient will increase 10 meter walk test to >1.11ms as to improve gait speed for better community ambulation and to reduce fall risk. Baseline: 0.83 m/s with QC; 9/14: 0.92 m/s with QC, without AD 0.91 m/s Goal status: Partially MET  3.  Patient will increase BLE gross strength by 1/2 point on MMT as to improve functional strength for independent gait, increased standing tolerance and increased ADL ability. Baseline: grossly 4+/5 BLE, with exception of R ankle testing deferred at eval; 9/15: grossly 4+/5 B including R ankle Goal status: IN PROGRESS  4.   Patient will demonstrate an improved Berg Balance Score of at least 3 points or greater as  to demonstrate improved balance with ADLs such as sitting/standing and transfer balance and reduced fall risk.  Baseline: 8/15: 51; 9/14: 54/56 Goal status: MET  5.  Patient will increase lower extremity functional scale to >60/80 to demonstrate improved functional mobility and increased tolerance with ADLs.  Baseline: 44/80; 9/14: 50/80 Goal status: INITIAL   6.  Patient will increase six minute walk test distance to >1300 ft for improve gait ability and return to PLOF to complete her walks in her neighborhood.  Baseline: 1194 ft without AD, with SBA, pt reported feeling fatigued in hips around minutes 4 Goal status: INITIAL    PLAN: PT FREQUENCY: 2x/week  PT DURATION: 12 weeks  PLANNED INTERVENTIONS: Therapeutic exercises, Therapeutic activity, Neuromuscular re-education, Balance training, Gait training, Patient/Family education, Self Care,  Joint mobilization, Joint manipulation, Stair training, Vestibular training, Canalith repositioning, Visual/preceptual remediation/compensation, Orthotic/Fit training, DME instructions, Electrical stimulation, Spinal mobilization, Cryotherapy, Moist heat, Splintting, Taping, Ultrasound, Manual therapy, and Re-evaluation  PLAN FOR NEXT SESSION:  balance and strength training, gentle mobility R ankle, isometrics, gait training, continue plan   Zollie Pee, PT 12/20/2021, 3:25 PM

## 2021-12-20 NOTE — Progress Notes (Signed)
Carelink Summary Report / Loop Recorder 

## 2021-12-20 NOTE — Therapy (Addendum)
OUTPATIENT OCCUPATIONAL THERAPY TREATMENT NOTE  Patient Name: Teresa [name] MRN: [id number] DOB:[birth year], 81 y.o., female Today's Date: 12/20/2021  PCP: Dr. [name] REFERRING PROVIDER: Dr. [name]   OT End of Session - 12/20/21 1104     Visit Number 59    Number of Visits 72    Date for OT Re-Evaluation 01/31/22    OT Start Time 1100    OT Stop Time 1144    OT Time Calculation (min) 44 min    Activity Tolerance Patient tolerated treatment well    Behavior During Therapy [facility] for tasks assessed/performed             Past Medical History:  Diagnosis Date   Cancer ([place])    skin   Hypothyroidism    Raynaud's disease    Past Surgical History:  Procedure Laterality Date   [place]   BACK SURGERY  2010   Cervical fusion C4-5-6-7   CATARACT EXTRACTION, BILATERAL Bilateral 10/2016   CHOLECYSTECTOMY  1995   COLONOSCOPY WITH PROPOFOL N/A 12/08/2018   Procedure: COLONOSCOPY WITH PROPOFOL;  Surgeon: Toledo, Benay Pike, MD;  Location: [facility] ENDOSCOPY;  Service: Gastroenterology;  Laterality: N/A;   EYE SURGERY     Patient Active Problem List   Diagnosis Date Noted   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  Following inpt rehab, pt went home and had home health.   PRECAUTIONS: fall  SUBJECTIVE:   Pt reports she is doing well, she and her husband will need to go pick up her grand daughter from school next  week for fall break.  She has been continuing to work on her left hand with composite fisting, ROM of LUE and coordination tasks. PAIN:  Are you having pain? Yes: NPRS scale: 3/10 Pain location:   upper trap Pain description: tightness, sore, tender Aggravating factors: N/A Relieving factors: rest, heat, otc pain meds   OBJECTIVE:  TODAY'S TREATMENT:  Therapeutic Exercise: Pt seen this date with ROM exercises utilizing medium sized playground sized ball for overhead press, chest press, anternating opposite shoulder touches for 10 reps each.  Ball toss/catch across the table then bounce/catch on table.    Tennis ball drills in standing for toss/catch, bounce/catch between patient and therapist, assist at times to retrieve ball if pt misses or drops ball.  Multiple trials completed.   Neuro re-ed: Use of Super Big Boggle, picking up large square like dice to place into grid with cues for a tip to tip prehension pattern. Pt demonstrates difficulty removing items from the grid with it being a bit shallow.  If squares are removed and placed onto table, she was able to pick up and place and form into words.       PATIENT EDUCATION: Education details: LUE [place] skills Person educated: pt Education method: Explanation and Verbal cues Education comprehension: verbalized understanding   HOME EXERCISE PROGRAM Continue to engage LUE into ADLs; continue gripping  and pinching exercises for LUE, and L shoulder AROM/AAROM, crochet , typing    OT Long Term Goals -       OT LONG TERM GOAL #1   Title Pt will be independent with home exercise program.    Baseline Eval: no current program, 10th visit:  continue to add new exercises as pt progresses, 20th:  continue to update HEP; 08/17/21: continue to progress HEP when indicated.  Visit 40: adding new exercises as pt progresses   Time 12    Period Weeks    Status On-going    Target Date 01/31/22      OT LONG TERM GOAL #2   Title Pt will complete UB  and LB dressing with modified independence including buttons, snaps and zippers.    Baseline requires min assist at eval, 10th visit: occasional assist with buttons, 20th:  able to perform one handed, but difficulty with bilateral UE; 08/17/21: pt reports inconsistent with 1 hand, reviewed techniques this visit, Visit 40:  Pt requires assist with bra   Time 12    Period Weeks    Status MET   Target Date 11/08/21     OT LONG TERM GOAL #3   Title Pt will perform shower transfer with modified independence.    Baseline Pt requires supervision to min assist for shower transfer at home. 10th visit: supervision; 08/17/21: supv .  Visit 40:  Pt had met goal but had recent fall and now requiring min guard to supv   Time 6    Period Weeks    Status  Met    Target Date 11/08/21      OT LONG TERM GOAL #4   Title Pt will improve L hand grip by 10# to assist with holding items in left hand securely.    Baseline no grip in left hand at eval, 10th visit:  improved flexion but still working towards composite fisting and grip. 20th:  continues to demo decreased grip; 08/17/21: active digit flexion improving, but not yet able to register grip on dynamometer; 09/04/21: L grip 1# 7/17:  5#    Time 12    Period Weeks    Status On-going    Target Date 01/31/22      OT LONG TERM GOAL #5   Title Pt will improve left shoulder flexion to 100 degrees or better to improve reaching to obtain self care items from shelf/shoulder height.    Baseline difficulty with reach, shoulder flexion to 47 degrees; 08/17/21: L shoulder flexion 85, but not yet able to consistently hold ADL supplies in L hand when reaching; 09/04/21: flexion 85 7/17:  shoulder flexion to 90   Time 12    Period Weeks    Status On-going    Target Date 01/31/22      OT LONG TERM GOAL #6   Title Pt will improve FOTO score to 47 or above to demonstrate a clinically relevant change in function to impact ADL tasks.    Baseline score of 30 at eval; 08/17/21:  FOTO: 42; 09/04/21: FOTO : 54 , FOTO 7/7:  60   Time 12    Period Weeks    Status  MET    Target Date 01/31/22      OT LONG TERM GOAL #7   Title Pt will demonstrate ability to pick up small objects and complete 9 hole peg test in less than 2 mins.    Baseline unable to perform at eval, 10th visit: still unable to  pick up small pegs, 20th: unable to pick up pegs but improving; 08/17/21: not attempted d/t time constraints, will assess next visit; 09/04/21: 9 hole peg test in 5 min 53 sec;  7/17:  Pt unable to complete this date but was able to place 6 of 9 pegs in 4 mins 20 secs   Time 12    Period Weeks    Status On-going    Target Date 01/31/2022             Plan -     Clinical Impression Statement Pt continues to progress with left hand function, edema decreased overall, able to demo composite fist with effort.  Pt responds well to cues for prehension patterns for tip to tip to pick up items.  Will plan to do progress update next session.  Continue to work towards goals to maximize safety and independence in necessary daily tasks.     OT Occupational Profile and History Detailed Assessment- Review of Records and additional review of physical, cognitive, psychosocial history related to current functional performance    Occupational performance deficits (Please refer to evaluation for details): ADL's;IADL's;Leisure;Rest and Sleep    Body Structure / Function / Physical Skills ADL;Coordination;Endurance;GMC;UE functional use;Balance;IADL;Pain;Dexterity;FMC;Strength;Edema;Mobility;ROM    Psychosocial Skills Environmental  Adaptations;Habits;Routines and Behaviors    Rehab Potential Good    Clinical Decision Making Several treatment options, min-mod task modification necessary   Comorbidities Affecting Occupational Performance: May have comorbidities impacting occupational performance    Modification or Assistance to Complete Evaluation  Min-Moderate modification of tasks or assist with assess  necessary to complete eval    OT Frequency 2x / week    OT Duration 12 weeks    OT Treatment/Interventions Self-care/ADL training;Cryotherapy;Paraffin;Therapeutic exercise;DME and/or AE instruction;Functional Mobility Training;Balance training;Electrical Stimulation;Ultrasound;Neuromuscular education;Manual Therapy;Splinting;Moist Heat;Contrast Bath;Passive range of motion;Therapeutic activities;Patient/family education;Coping strategies training    Plan OT recert    Consulted and Agree with Plan of Care Patient            Shacola Schussler Colette Ribas, OT 12/20/2021, 9:01 PM

## 2021-12-25 ENCOUNTER — Ambulatory Visit: Payer: Medicare PPO

## 2021-12-25 ENCOUNTER — Ambulatory Visit: Payer: Medicare PPO | Attending: Internal Medicine

## 2021-12-25 DIAGNOSIS — I63511 Cerebral infarction due to unspecified occlusion or stenosis of right middle cerebral artery: Secondary | ICD-10-CM | POA: Diagnosis present

## 2021-12-25 DIAGNOSIS — R262 Difficulty in walking, not elsewhere classified: Secondary | ICD-10-CM | POA: Insufficient documentation

## 2021-12-25 DIAGNOSIS — M6281 Muscle weakness (generalized): Secondary | ICD-10-CM | POA: Diagnosis not present

## 2021-12-25 DIAGNOSIS — R2681 Unsteadiness on feet: Secondary | ICD-10-CM | POA: Insufficient documentation

## 2021-12-25 DIAGNOSIS — R278 Other lack of coordination: Secondary | ICD-10-CM

## 2021-12-25 NOTE — Therapy (Unsigned)
OUTPATIENT OCCUPATIONAL THERAPY TREATMENT NOTE  Patient Name: Teresa [name] MRN: [id number] DOB:1940/03/26, 81 y.o., female Today's Date: 12/13/2021  PCP: Dr. [name] REFERRING PROVIDER: Dr. [name]     Past Medical History:  Diagnosis Date   Cancer [facility])    skin   Hypothyroidism    Raynaud's disease    Past Surgical History:  Procedure Laterality Date   ABDOMINAL HYSTERECTOMY  1991   APPENDECTOMY  1991   BACK SURGERY  2010   Cervical fusion C4-5-6-7   CATARACT EXTRACTION, BILATERAL Bilateral 10/2016   CHOLECYSTECTOMY  1995   COLONOSCOPY WITH PROPOFOL N/A 12/08/2018   Procedure: COLONOSCOPY WITH PROPOFOL;  Surgeon: Toledo, Benay Pike, MD;  Location: [facility] ENDOSCOPY;  Service: Gastroenterology;  Laterality: N/A;   EYE SURGERY     Patient Active Problem List   Diagnosis Date Noted   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  Rationale for Evaluation and Treatment Rehabilitation  PERTINENT HISTORY: February 26, 2021, pt reports she had a CVA, came to [facility] to ER and then was transferred to [facility] in [place] where she was admitted and after acute care she went to inpatient rehab.  Following inpt rehab, pt went home and had home health.   PRECAUTIONS: fall  SUBJECTIVE:  Pt reports doing well today.  She celebrated her birthday over the weekend.  PAIN:  Are you having pain? Yes: NPRS scale: 3/10 Pain location:   upper trap Pain description: tightness, sore, tender Aggravating factors: N/A Relieving factors: rest, heat, otc pain meds   OBJECTIVE:  L grip 6, R grip 41 R lateral 13, L 5 R 3 point 14 L 4 R shoulder   TODAY'S TREATMENT:  Therapeutic Exercise: Performed passive stretching for L shoulder flex/abd/horiz abd/ER, working to increase shoulder  mobility for ADLs.  Performed passive wrist and digit flex/ext stretch in prep for grasp/release activities.  Completed L grip strengthening with hand gripper at light resistance using 1 red band for 3 sets 10 reps each.    Neuro re-ed: Practiced LUE [place] skills bouncing, tossing, and 1 and 2 hand catching of tennis ball.  Pt was able to throw over and underhand using L.  Practiced initially catching ball with 2 hands, progressed to L hand only catching ball against torso, with steady progression to catching ball mid air with L hand cradling ball palm up.  Close supv for dynamic standing tasks.  Facilitated L hand dexterity skills working to pick up [place] stones from dish 1 at a time and moving to neighboring dish.  Made multiple attempts to store 1 in palm while picking up another, but stone in palm would slide through ulnar side of palm d/t pt unable to fully close L hand SF and RF against palm with a tight enough grasp to store items in palm.  Practiced scooping a handful from a dish and storing in palm; pt able to store up to 4 stones when digits 2-4 helped with storage.     PATIENT EDUCATION: Education details: LUE [place] skills Person educated: pt Education method: Explanation and Verbal cues Education comprehension: verbalized understanding   HOME EXERCISE PROGRAM Continue to engage LUE into ADLs; continue gripping and pinching exercises for LUE, and L shoulder AROM/AAROM, crochet , typing    OT Long Term Goals -  OT LONG TERM GOAL #1   Title Pt will be independent with home exercise program.    Baseline Eval: no current program, 10th visit:  continue to add new exercises as pt progresses, 20th:  continue to update HEP; 08/17/21: continue to progress HEP when indicated.  Visit 40: adding new exercises as pt progresses   Time 12    Period Weeks    Status On-going    Target Date 01/31/22      OT LONG TERM GOAL #2   Title Pt will complete UB and LB dressing with modified  independence including buttons, snaps and zippers.    Baseline requires min assist at eval, 10th visit: occasional assist with buttons, 20th:  able to perform one handed, but difficulty with bilateral UE; 08/17/21: pt reports inconsistent with 1 hand, reviewed techniques this visit, Visit 40:  Pt requires assist with bra   Time 12    Period Weeks    Status MET   Target Date 11/08/21     OT LONG TERM GOAL #3   Title Pt will perform shower transfer with modified independence.    Baseline Pt requires supervision to min assist for shower transfer at home. 10th visit: supervision; 08/17/21: supv .  Visit 40:  Pt had met goal but had recent fall and now requiring min guard to supv   Time 6    Period Weeks    Status  Met    Target Date 11/08/21      OT LONG TERM GOAL #4   Title Pt will improve L hand grip by 10# to assist with holding items in left hand securely.    Baseline no grip in left hand at eval, 10th visit:  improved flexion but still working towards composite fisting and grip. 20th:  continues to demo decreased grip; 08/17/21: active digit flexion improving, but not yet able to register grip on dynamometer; 09/04/21: L grip 1# 7/17:  5#    Time 12    Period Weeks    Status On-going    Target Date 01/31/22      OT LONG TERM GOAL #5   Title Pt will improve left shoulder flexion to 100 degrees or better to improve reaching to obtain self care items from shelf/shoulder height.    Baseline difficulty with reach, shoulder flexion to 47 degrees; 08/17/21: L shoulder flexion 85, but not yet able to consistently hold ADL supplies in L hand when reaching; 09/04/21: flexion 85 7/17:  shoulder flexion to 90; 105 (P 125)    Time 12    Period Weeks    Status On-going    Target Date 01/31/22      OT LONG TERM GOAL #6   Title Pt will improve FOTO score to 47 or above to demonstrate a clinically relevant change in function to impact ADL tasks.    Baseline score of 30 at eval; 08/17/21: FOTO: 42; 09/04/21:  FOTO : 54 , FOTO 7/7:  60   Time 12    Period Weeks    Status  MET    Target Date 01/31/22      OT LONG TERM GOAL #7   Title Pt will demonstrate ability to pick up small objects and complete 9 hole peg test in less than 2 mins.    Baseline unable to perform at eval, 10th visit: still unable to pick up small pegs, 20th: unable to pick up pegs but improving; 08/17/21: not attempted d/t time constraints, will assess next visit;  09/04/21: 9 hole peg test in 5 min 53 sec;  7/17:  Pt unable to complete this date but was able to place 6 of 9 pegs in 4 mins 20 secs; 12/25/21: L    Time 12    Period Weeks    Status On-going    Target Date 01/31/2022             Plan -     Clinical Impression Statement Pt tolerated therapeutic exercises and neuro re-ed activities well this date.  Pt continues to progress with [place] skills using LUE to bounce, throw, and catch a tennis ball.  Focused also on L hand dexterity skills.  Pt made multiple attempts to store 1 [place] stone in palm while picking up another, but stone in palm would slide through ulnar side of palm d/t pt unable to fully close L hand SF and RF against palm with a tight enough grasp to store items in palm.  Practiced scooping a handful from a dish and storing in palm; pt able to store up to 4 stones when digits 2-4 helped with storage.  Continue to work towards goals in plan of care to improve left UE function, decrease pain and increase active normal movement patterns for daily tasks.    OT Occupational Profile and History Detailed Assessment- Review of Records and additional review of physical, cognitive, psychosocial history related to current functional performance    Occupational performance deficits (Please refer to evaluation for details): ADL's;IADL's;Leisure;Rest and Sleep    Body Structure / Function / Physical Skills ADL;Coordination;Endurance;GMC;UE functional use;Balance;IADL;Pain;Dexterity;FMC;Strength;Edema;Mobility;ROM     Psychosocial Skills Environmental  Adaptations;Habits;Routines and Behaviors    Rehab Potential Good    Clinical Decision Making Several treatment options, min-mod task modification necessary   Comorbidities Affecting Occupational Performance: May have comorbidities impacting occupational performance    Modification or Assistance to Complete Evaluation  Min-Moderate modification of tasks or assist with assess necessary to complete eval    OT Frequency 2x / week    OT Duration 12 weeks    OT Treatment/Interventions Self-care/ADL training;Cryotherapy;Paraffin;Therapeutic exercise;DME and/or AE instruction;Functional Mobility Training;Balance training;Electrical Stimulation;Ultrasound;Neuromuscular education;Manual Therapy;Splinting;Moist Heat;Contrast Bath;Passive range of motion;Therapeutic activities;Patient/family education;Coping strategies training    Plan OT recert    Consulted and Agree with Plan of Care Patient            Leta Speller, MS, OTR/L  Darleene Cleaver, OT 12/25/2021, 11:58 AM

## 2021-12-27 ENCOUNTER — Ambulatory Visit: Payer: Medicare PPO

## 2021-12-27 DIAGNOSIS — I63511 Cerebral infarction due to unspecified occlusion or stenosis of right middle cerebral artery: Secondary | ICD-10-CM

## 2021-12-27 DIAGNOSIS — M6281 Muscle weakness (generalized): Secondary | ICD-10-CM

## 2021-12-27 DIAGNOSIS — R278 Other lack of coordination: Secondary | ICD-10-CM

## 2021-12-27 DIAGNOSIS — R262 Difficulty in walking, not elsewhere classified: Secondary | ICD-10-CM

## 2021-12-27 DIAGNOSIS — R2681 Unsteadiness on feet: Secondary | ICD-10-CM

## 2021-12-27 NOTE — Therapy (Signed)
OUTPATIENT PHYSICAL THERAPY LOWER EXTREMITY TREATMENT    Patient Name: Teresa [name] MRN: [id number] DOB:Jun 28, 1940, 81 y.o., female Today's Date: 12/27/2021   PT End of Session - 12/27/21 1448     Visit Number 15    Number of Visits 25    Date for PT Re-Evaluation 01/24/22    PT Start Time 1146    PT Stop Time 1225    PT Time Calculation (min) 39 min    Equipment Utilized During Treatment Gait belt    Activity Tolerance Patient tolerated treatment well;Patient limited by fatigue    Behavior During Therapy WFL for tasks assessed/performed                           Past Medical History:  Diagnosis Date   Cancer ([place])    skin   Hypothyroidism    Raynaud's disease    Past Surgical History:  Procedure Laterality Date   [place]   BACK SURGERY  2010   Cervical fusion C4-5-6-7   CATARACT EXTRACTION, BILATERAL Bilateral 10/2016   CHOLECYSTECTOMY  1995   COLONOSCOPY WITH PROPOFOL N/A 12/08/2018   Procedure: COLONOSCOPY WITH PROPOFOL;  Surgeon: Toledo, Benay Pike, MD;  Location: [facility] ENDOSCOPY;  Service: Gastroenterology;  Laterality: N/A;   EYE SURGERY     Patient Active Problem List   Diagnosis Date Noted   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 She reports she been prescribed oxybutynin due to increased urinary frequency. Pt would like to start being treated for PFT.  PAIN:  Are you having pain? Yes:  NPRS scale: Not rated/10 Pain location:  Pain description:  Aggravating factors:  Relieving factors:   PERTINENT HISTORY: On June 24th, 2023 pt fell pulling up her pajama pants at home, slipped, and fractured her R ankle (closed traumatic nondisplaced fracture of posterior malleolus of R tibia). Pt had been working with PT for R side MCA CVA (02/26/21). Pt now returns to PT. She is WBAT per chart (Dr. Doy Hutching). Pt uses a quad cane for ambulation. She is not wearing an ankle brace, but does report she wears a brace when out for longer periods of time (does not currently have it with her for exam). She reports she has difficulty with balance with turning and feels she is dragging her LLE. Pt reports she has no ankle pain. Feels her balance is generally off. She reports she has been moving her R ankle and walking on it for exercise. Pt is not aware of any other restrictions other than to wear her brace. Pt reports she was discharged from working with her physician for her ankle due to good progress.  Pt has not had any other falls since the one in June, just stumbling with turns. She continues to work with OT for her UE s/p CVA. PMH includes: skin cancer, hypothyroidism, and Raynaud's disease, history of cervical fusion in 2010 C4-C7, R side MCA CVA 02/26/21.  PRECAUTIONS: Fall  WEIGHT BEARING RESTRICTIONS Yes WBAT, pt has R ankle brace but reports only wears for long-distance walking  FALLS:  Has patient fallen in last 6 months? Yes. Number of falls 1  LIVING ENVIRONMENT: Lives with: lives with their spouse Lives in: House/apartment Stairs: Yes: External: 1 steps; none Does have second floor, with handrail going up on L, but hasn't gone upstairs per report Has following equipment at home: Quad cane small base, Environmental consultant - 2 wheeled, Wheelchair (manual), and Grab bars   PLOF: Independent with household mobility with device and reports husband has helped with bathing since stroke  PATIENT GOALS   Improve her balance with turning and not dragging her feet, "the left leg is dragging again"   OBJECTIVE:  Taken at eval unless indicated otherwise DIAGNOSTIC FINDINGS:  Information via chart - Scarlett Presto, PA 10/23/2021: "Xrays of the right ankle were ordered and interpreted 10/23/2021, with 3 views using AP, lateral, and mortise views. Xrays revealed the right ankle with a posterior malleolus nondisplaced fracture with no angulation or rotation. The ankle mortise is well-maintained. There is significant callus formation or fracture healing noted on today's x-rays."    PATIENT SURVEYS:  LEFS 44/80   FOTO: 47 (taken 11/06/2021)  COGNITION:  Overall cognitive status: Within functional limits for tasks assessed     SENSATION: WFL BLE, denies any n/t in BLE  EDEMA:  None observed currently, but does report swelling later in the day in her B ankles "just with the socks"  MUSCLE LENGTH: Hamstrings: Right -18 deg; Left -19 deg deg from neutral   POSTURE: rounded shoulders and increased thoracic kyphosis  PALPATION: Medial lower leg just superior to medial malleolus feels "bruised"  LOWER EXTREMITY ROM:  Active ROM Right eval Left eval  Ankle dorsiflexion 1 5  Ankle plantarflexion ~50 ~50   (Blank rows = not tested)  LOWER EXTREMITY MMT:   BLE grossly 4+/5 (*did not test R ankle musculature today)   FUNCTIONAL TESTS:  10 meter walk test: 0.83 m/s with QC  Berg: 51/56 taken 11/06/2021  TODAY'S TREATMENT:  Treadmill endurance training @ 1% elevation, ambulates 1.5-1.6 mph, BUE support. Not as fatigued today but does report she can tell she's relying on her BUEs.  Heel raises 20x B  15x single leg heel raise each LE  2.5# AW each LE: Hip abduction 2x15 each LE Hip ext. 2x15 each LE  March alt LE 20x  Leg press: 40# 10x BLE 55# 10x BLE    NMR: Stairs ascend/descend with UUE support to assess balance - recip stepping both ways - 2x. PT provides close CGA,  discusses with pt safety with technique for stair navigation. Standing on half foam 2x30 sec  Standing on half foam with vert and horiz head turns 10x each with 2 finger support on bar  Standing on half-bosu 2x60 sec in NBOS SLB 2x30 sec each LE - intermittent UE support throughout, pt notes difficulty with intervention   PATIENT EDUCATION:  Education details: Pt educated throughout session about proper posture and technique with exercises. Improved exercise technique, movement at target joints, use of target muscles after min to mod verbal, visual, tactile cues.  Person educated: Patient Education method: Explanation, Demonstration, and Verbal cues Education comprehension: verbalized understanding, returned demonstration, and needs further education    HOME EXERCISE PROGRAM:   10/5: Access Code: [email or web address] URL: [email or web address] Date: 12/27/2021 Prepared by: Ricard Dillon  Exercises - Standing Single Leg Stance with Counter Support  - 1 x daily -  7 x weekly - 2 sets - 2 reps - 30 hold   8/15: Access Code: [email or web address] URL: [email or web address] Date: 11/06/2021 Prepared by: Ricard Dillon  Exercises - Supine Single Leg Ankle Pumps  - 1 x daily - 7 x weekly - 2 sets - 20 reps - Supine Active Straight Leg Raise  - 1 x daily - 5 x weekly - 2 sets - 15 reps  ASSESSMENT:  CLINICAL IMPRESSION:  Pt reports she is now taking medications to treat increased urinary frequency/urgency. She continues to report back pain. PT has instructed pt to reach out to her physician for orders for PFT to address increased urgency with back pain. Pt verbalizes understanding and eager to start PFT with a pelvic PT. Plan to continue to address pt's impaired balance and endurance pending new orders. The patient will benefit from further skilled PT to improve strength, balance and mobility to further improve deficits and make progress toward goals.  OBJECTIVE IMPAIRMENTS Abnormal  gait, decreased activity tolerance, decreased balance, decreased coordination, decreased endurance, decreased mobility, difficulty walking, decreased ROM, decreased strength, hypomobility, increased edema, impaired flexibility, impaired UE functional use, improper body mechanics, postural dysfunction, and pain.   ACTIVITY LIMITATIONS carrying, lifting, standing, stairs, bed mobility, bathing, dressing, reach over head, hygiene/grooming, and locomotion level  PARTICIPATION LIMITATIONS: meal prep, cleaning, laundry, driving, shopping, community activity, and yard work  PERSONAL FACTORS Age, Past/current experiences, Sex, and 3+ comorbidities: hypothyroidism, and Raynaud's disease, history of cervical fusion in 2010 C4-C7, R side MCA CVA 02/26/21  are also affecting patient's functional outcome.   REHAB POTENTIAL: Good  CLINICAL DECISION MAKING: Stable/uncomplicated  EVALUATION COMPLEXITY: Moderate   GOALS: Goals reviewed with patient? Yes  SHORT TERM GOALS: Target date: 12/27/2021  Patient will be independent in home exercise program to improve strength/mobility for better functional independence with ADLs. Baseline: to be initiated next 1-2 sessions; 8/15: initiated; 9/14: Pt reports she indep Goal status: MET LONG TERM GOALS: Target date: 01/24/2022   Patient will increase FOTO score to equal to or greater than  63  to demonstrate statistically significant improvement in mobility and quality of life.  Baseline: 8/15: 47; 9/14: 66% Goal status: MET  2.   Patient will increase 10 meter walk test to >1.45ms as to improve gait speed for better community ambulation and to reduce fall risk. Baseline: 0.83 m/s with QC; 9/14: 0.92 m/s with QC, without AD 0.91 m/s Goal status: Partially MET  3.  Patient will increase BLE gross strength by 1/2 point on MMT as to improve functional strength for independent gait, increased standing tolerance and increased ADL ability. Baseline: grossly 4+/5 BLE,  with exception of R ankle testing deferred at eval; 9/15: grossly 4+/5 B including R ankle Goal status: IN PROGRESS  4.   Patient will demonstrate an improved Berg Balance Score of at least 3 points or greater as to demonstrate improved balance with ADLs such as sitting/standing and transfer balance and reduced fall risk.  Baseline: 8/15: 51; 9/14: 54/56 Goal status: MET  5.  Patient will increase lower extremity functional scale to >60/80 to demonstrate improved functional mobility and increased tolerance with ADLs.  Baseline: 44/80; 9/14: 50/80 Goal status: INITIAL   6.  Patient will increase six minute walk test distance to >1300 ft for improve gait ability and return to PLOF to complete her walks in her neighborhood.  Baseline: 1194 ft without AD, with SBA, pt reported feeling fatigued in hips around minutes 4 Goal status: INITIAL  PLAN: PT FREQUENCY: 2x/week  PT DURATION: 12 weeks  PLANNED INTERVENTIONS: Therapeutic exercises, Therapeutic activity, Neuromuscular re-education, Balance training, Gait training, Patient/Family education, Self Care, Joint mobilization, Joint manipulation, Stair training, Vestibular training, [place] repositioning, Visual/preceptual remediation/compensation, Orthotic/Fit training, DME instructions, Electrical stimulation, Spinal mobilization, Cryotherapy, Moist heat, Splintting, Taping, Ultrasound, Manual therapy, and Re-evaluation  PLAN FOR NEXT SESSION:  balance and strength training, gentle mobility R ankle, isometrics, gait training, continue plan   Zollie Pee, PT 12/27/2021, 2:58 PM

## 2021-12-27 NOTE — Therapy (Signed)
OUTPATIENT OCCUPATIONAL THERAPY NEURO TREATMENT NOTE Patient Name: Teresa [name] MRN: [id number] DOB:December 20, 1940, 81 y.o., female Today's Date: 12/13/2021  PCP: Dr. [name] REFERRING PROVIDER: Dr. [name]     OT End of Session - 12/27/21 1247     Visit Number 61    Number of Visits 72    Date for OT Re-Evaluation 01/31/22    Authorization Time Period Reporting period beginning 12/25/21    OT Start Time 1100    OT Stop Time 1145    OT Time Calculation (min) 45 min    Activity Tolerance Patient tolerated treatment well    Behavior During Therapy [facility] for tasks assessed/performed             Past Medical History:  Diagnosis Date   Cancer ([place])    skin   Hypothyroidism    Raynaud's disease    Past Surgical History:  Procedure Laterality Date   [place]   BACK SURGERY  2010   Cervical fusion C4-5-6-7   CATARACT EXTRACTION, BILATERAL Bilateral 10/2016   CHOLECYSTECTOMY  1995   COLONOSCOPY WITH PROPOFOL N/A 12/08/2018   Procedure: COLONOSCOPY WITH PROPOFOL;  Surgeon: Toledo, Benay Pike, MD;  Location: [facility] ENDOSCOPY;  Service: Gastroenterology;  Laterality: N/A;   EYE SURGERY     Patient Active Problem List   Diagnosis Date Noted   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  Following inpt rehab, pt went home and had home health.   PRECAUTIONS: fall  SUBJECTIVE:  Pt reports doing  well today; no pain.  PAIN:  Are you having pain? Yes: NPRS scale: 3/10 Pain location:   L upper trap, pec Pain description: tightness, sore, tender Aggravating factors: N/A Relieving factors: rest, heat, otc pain meds   OBJECTIVE:  L grip 6, R grip 41 L lateral pinch 5, R 13 L 3 point pinch 4, R 14  L 9 hole 1 min 43 sec. L shoulder active flexion 0-105, passive 0-125  TODAY'S TREATMENT:  Moist heat applied to L shoulder  Therapeutic Exercise: Performed gentle passive scapular mobility for protraction/retraction/elevation/depression; AAROM for L shoulder forward flex, abd, ER, and horiz abd, passive stretching for increasing flexion at digits 1-5 at each joint, working to increase flexibility for reaching and grasping activities.  Facilitated L grip strengthening with moderate resistance on hand gripper (1 green band); pt completed 2 sets 10 reps beginning of session, 1 set 10 end of session.  Min vc for rest breaks between sets and adjusting grip on gripper to reduce dropping and to ensure engagement of all digits when gripping.   Neuro re-ed: Facilitated L hand dexterity skills working to scoop up [place] stones from dish and store in hand.  Pt was cued for technique, using tight grasp to limit dropping stones from ulnar side of hand.  Pt was able to scoop up to 4-5 in palm, improved from attempt a week ago.  Practiced  manipulation of small pegs, initially removing pegs from board with a 2 and 3 point pinch pattern, progressing to placing pegs in board grabbing from therapist's hand, and worked toward picking pegs up from table top from a non-skid surface and placing into pegboard.  Pt was able to place 4 pegs into pegboard when picking up from a non-skid surface with several dropped pegs amongst the 4 successfully placed pegs.    PATIENT EDUCATION: Education details: LUE [place] skills Person educated: pt Education method: Explanation and Verbal cues Education comprehension: verbalized  understanding   HOME EXERCISE PROGRAM Continue to engage LUE into ADLs; continue gripping and pinching exercises for LUE, and L shoulder AROM/AAROM, crochet , typing    OT Long Term Goals -       OT LONG TERM GOAL #1   Title Pt will be independent with home exercise program.    Baseline Eval: no current program, 10th visit:  continue to add new exercises as pt progresses, 20th:  continue to update HEP; 08/17/21: continue to progress HEP when indicated.  Visit 40: adding new exercises as pt progresses; 12/25/21: ongoing with progressions   Time 12    Period Weeks    Status On-going    Target Date 01/31/22      OT LONG TERM GOAL #2   Title Pt will complete UB and LB dressing with modified independence including buttons, snaps and zippers.    Baseline requires min assist at eval, 10th visit: occasional assist with buttons, 20th:  able to perform one handed, but difficulty with bilateral UE; 08/17/21: pt reports inconsistent with 1 hand, reviewed techniques this visit, Visit 40:  Pt requires assist with bra   Time 12    Period Weeks    Status MET   Target Date 11/08/21     OT LONG TERM GOAL #3   Title Pt will perform shower transfer with modified independence.    Baseline Pt requires supervision to min assist for shower transfer at home. 10th visit: supervision; 08/17/21: supv .  Visit 40:  Pt had met goal but had recent fall and now requiring min guard to supv   Time 6    Period Weeks    Status  Met    Target Date 11/08/21      OT LONG TERM GOAL #4   Title Pt will improve L hand grip by 10# to assist with holding items in left hand securely.    Baseline no grip in left hand at eval, 10th visit:  improved flexion but still working towards composite fisting and grip. 20th:  continues to demo decreased grip; 08/17/21: active digit flexion improving, but not yet able to register grip on dynamometer; 09/04/21: L grip 1# 7/17:  5#; 12/25/21: L grip 6#   Time 12    Period Weeks    Status  On-going    Target Date 01/31/22      OT LONG TERM GOAL #5   Title Pt will improve left shoulder flexion to 100 degrees or better to improve reaching to obtain self care items from shelf/shoulder height.    Baseline difficulty with reach, shoulder flexion to 47 degrees; 08/17/21: L shoulder flexion 85, but not yet able to consistently hold ADL supplies in L hand when reaching; 09/04/21: flexion 85 7/17:  shoulder flexion to 90; 12/25/21: 105 (P 125)    Time 12    Period Weeks    Status On-going    Target Date 01/31/22  OT LONG TERM GOAL #6   Title Pt will improve FOTO score to 47 or above to demonstrate a clinically relevant change in function to impact ADL tasks.    Baseline score of 30 at eval; 08/17/21: FOTO: 42; 09/04/21: FOTO : 54 , FOTO 7/7:  60; 12/25/21: 59   Time 12    Period Weeks    Status  MET/ongoing    Target Date 01/31/22      OT LONG TERM GOAL #7   Title Pt will demonstrate ability to pick up small objects and complete 9 hole peg test in less than 1 min 30 sec (revised from under 2 min)   Baseline unable to perform at eval, 10th visit: still unable to pick up small pegs, 20th: unable to pick up pegs but improving; 08/17/21: not attempted d/t time constraints, will assess next visit; 09/04/21: 9 hole peg test in 5 min 53 sec;  7/17:  Pt unable to complete this date but was able to place 6 of 9 pegs in 4 mins 20 secs; 12/25/21: L 1 min 43 sec   Time 12    Period Weeks    Status Revised/On-going    Target Date 01/31/2022             Plan -     Clinical Impression Statement Pt reporting increased soreness in L shoulder today, reporting that she was sore from stretching last OT session, and likely from increased gross motor focus bouncing tennis ball.  Pt responded well to heat, scapular mobility, and gentle AAROM below end range today, and reported no pain by end of session.  Encouraged pt focus on table slides over the weekend to continue a gentle stretch.  Pt demonstrated  improved scooping and storing of [place] stones today in L hand, storing up to 4-5 at a time.  Pt was also able to work with small pegs today and was able to place 4 into pegboard picking them up from a non-skid surface using L hand.  Dexterity has improved greatly over the last couple of weeks.  OT encouraged pt re-visit her crocheting at home as she hasn't attempted this since June.  Pt agreed she would try again this weekend.  Pt will continue to work towards goals in plan of care to improve left UE function, decrease pain and increase active normal movement patterns for daily tasks.    OT Occupational Profile and History Detailed Assessment- Review of Records and additional review of physical, cognitive, psychosocial history related to current functional performance    Occupational performance deficits (Please refer to evaluation for details): ADL's;IADL's;Leisure;Rest and Sleep    Body Structure / Function / Physical Skills ADL;Coordination;Endurance;GMC;UE functional use;Balance;IADL;Pain;Dexterity;FMC;Strength;Edema;Mobility;ROM    Psychosocial Skills Environmental  Adaptations;Habits;Routines and Behaviors    Rehab Potential Good    Clinical Decision Making Several treatment options, min-mod task modification necessary   Comorbidities Affecting Occupational Performance: May have comorbidities impacting occupational performance    Modification or Assistance to Complete Evaluation  Min-Moderate modification of tasks or assist with assess necessary to complete eval    OT Frequency 2x / week    OT Duration 12 weeks    OT Treatment/Interventions Self-care/ADL training;Cryotherapy;Paraffin;Therapeutic exercise;DME and/or AE instruction;Functional Mobility Training;Balance training;Electrical Stimulation;Ultrasound;Neuromuscular education;Manual Therapy;Splinting;Moist Heat;Contrast Bath;Passive range of motion;Therapeutic activities;Patient/family education;Coping strategies training    Plan OT recert     Consulted and Agree with Plan of Care Patient            Leta Speller, MS, OTR/L  Darleene Cleaver, OT 12/27/2021, 12:48 PM

## 2022-01-01 ENCOUNTER — Ambulatory Visit: Payer: Medicare PPO

## 2022-01-01 DIAGNOSIS — R2681 Unsteadiness on feet: Secondary | ICD-10-CM

## 2022-01-01 DIAGNOSIS — R278 Other lack of coordination: Secondary | ICD-10-CM

## 2022-01-01 DIAGNOSIS — M6281 Muscle weakness (generalized): Secondary | ICD-10-CM

## 2022-01-01 DIAGNOSIS — I63511 Cerebral infarction due to unspecified occlusion or stenosis of right middle cerebral artery: Secondary | ICD-10-CM

## 2022-01-01 NOTE — Therapy (Signed)
OUTPATIENT PHYSICAL THERAPY LOWER EXTREMITY TREATMENT    Patient Name: Sache Sane MRN: [id number] DOB:Feb 18, 1941, 81 y.o., female Today's Date: 01/01/2022   PT End of Session - 01/01/22 1701     Visit Number 16    Number of Visits 25    Date for PT Re-Evaluation 01/24/22    PT Start Time 1103    PT Stop Time 1143    PT Time Calculation (min) 40 min    Equipment Utilized During Treatment Gait belt    Activity Tolerance Patient tolerated treatment well;Patient limited by fatigue    Behavior During Therapy WFL for tasks assessed/performed                            Past Medical History:  Diagnosis Date   Cancer ([place])    skin   Hypothyroidism    Raynaud's disease    Past Surgical History:  Procedure Laterality Date   [place]   BACK SURGERY  2010   Cervical fusion C4-5-6-7   CATARACT EXTRACTION, BILATERAL Bilateral 10/2016   CHOLECYSTECTOMY  1995   COLONOSCOPY WITH PROPOFOL N/A 12/08/2018   Procedure: COLONOSCOPY WITH PROPOFOL;  Surgeon: Toledo, Benay Pike, MD;  Location: [facility] ENDOSCOPY;  Service: Gastroenterology;  Laterality: N/A;   EYE SURGERY     Patient Active Problem List   Diagnosis Date Noted   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 She reports she has emptied fully but still feels like she has to go. She reports when she does not urinate  her back hurts. She reports she is still having some accidents (incontinence). Pt reports she walked a lot two days in a row, totaling over 5k/day outside.  PAIN:  Are you having pain? Yes: back NPRS scale: Not rated/10 Pain location:  Pain description:  Aggravating factors:  Relieving factors:   PERTINENT HISTORY: On June 24th, 2023 pt fell pulling up her pajama pants at home, slipped, and fractured her R ankle (closed traumatic nondisplaced fracture of posterior malleolus of R tibia). Pt had been working with PT for R side MCA CVA (02/26/21). Pt now returns to PT. She is WBAT per chart (Dr. Doy Hutching). Pt uses a quad cane for ambulation. She is not wearing an ankle brace, but does report she wears a brace when out for longer periods of time (does not currently have it with her for exam). She reports she has difficulty with balance with turning and feels she is dragging her LLE. Pt reports she has no ankle pain. Feels her balance is generally off. She reports she has been moving her R ankle and walking on it for exercise. Pt is not aware of any other restrictions other than to wear her brace. Pt reports she was discharged from working with her physician for her ankle due to good progress.  Pt has not had  any other falls since the one in June, just stumbling with turns. She continues to work with OT for her UE s/p CVA. PMH includes: skin cancer, hypothyroidism, and Raynaud's disease, history of cervical fusion in 2010 C4-C7, R side MCA CVA 02/26/21.  PRECAUTIONS: Fall  WEIGHT BEARING RESTRICTIONS Yes WBAT, pt has R ankle brace but reports only wears for long-distance walking  FALLS:  Has patient fallen in last 6 months? Yes. Number of falls 1  LIVING ENVIRONMENT: Lives with: lives with their spouse Lives in: House/apartment Stairs: Yes: External: 1 steps; none Does have second floor, with handrail going up on L, but hasn't gone upstairs per report Has following equipment at home: Quad cane small  base, Environmental consultant - 2 wheeled, Wheelchair (manual), and Grab bars   PLOF: Independent with household mobility with device and reports husband has helped with bathing since stroke  PATIENT GOALS  Improve her balance with turning and not dragging her feet, "the left leg is dragging again"   OBJECTIVE:  Taken at eval unless indicated otherwise DIAGNOSTIC FINDINGS:  Information via chart - Scarlett Presto, PA 10/23/2021: "Xrays of the right ankle were ordered and interpreted 10/23/2021, with 3 views using AP, lateral, and mortise views. Xrays revealed the right ankle with a posterior malleolus nondisplaced fracture with no angulation or rotation. The ankle mortise is well-maintained. There is significant callus formation or fracture healing noted on today's x-rays."    PATIENT SURVEYS:  LEFS 44/80   FOTO: 47 (taken 11/06/2021)  COGNITION:  Overall cognitive status: Within functional limits for tasks assessed     SENSATION: WFL BLE, denies any n/t in BLE  EDEMA:  None observed currently, but does report swelling later in the day in her B ankles "just with the socks"  MUSCLE LENGTH: Hamstrings: Right -18 deg; Left -19 deg deg from neutral   POSTURE: rounded shoulders and increased thoracic kyphosis  PALPATION: Medial lower leg just superior to medial malleolus feels "bruised"  LOWER EXTREMITY ROM:  Active ROM Right eval Left eval  Ankle dorsiflexion 1 5  Ankle plantarflexion ~50 ~50   (Blank rows = not tested)  LOWER EXTREMITY MMT:   BLE grossly 4+/5 (*did not test R ankle musculature today)   FUNCTIONAL TESTS:  10 meter walk test: 0.83 m/s with QC  Berg: 51/56 taken 11/06/2021  TODAY'S TREATMENT:  TherEx: Treadmill endurance training @ 2% elevation 1.6 mph x 5 min then pt performed interval training 1.5-1.8 mph for additional 3 minutes. Rates medium  Seated toe raises 25x 1 sec holds B Standing toe raises, cuing for technique 20x B Seated DF 20x B with 2.5# AW     Standing heel raises 20x B  Single leg heel raises 2x15 each LE    NMR:  SLB 2x30 sec each LE  Standing cone taps for SLB progression with and without dual task, each LE x multiple reps  Standing on half-foam WBOS and NBOS, with and without reaching for letters on board in NBOS. Comments: Pt uses intermittent UE support throughout    PATIENT EDUCATION:  Education details: Pt educated throughout session about proper posture and technique with exercises. Improved exercise technique, movement at target joints, use of target muscles after min to mod verbal, visual, tactile cues.  Person educated: Patient Education method: Explanation, Demonstration, and Verbal cues Education comprehension: verbalized understanding, returned demonstration, and needs further education    HOME EXERCISE PROGRAM:   10/5: Access Code: [email or web address] URL: [email or web address] Date: 12/27/2021 Prepared by: Ricard Dillon  Exercises - Standing Single Leg Stance with Counter Support  - 1 x daily - 7 x weekly - 2 sets - 2 reps - 30 hold   8/15: Access Code: [email or web address] URL: [email or web address] Date: 11/06/2021 Prepared by: Ricard Dillon  Exercises - Supine Single Leg Ankle Pumps  - 1 x daily - 7 x weekly - 2 sets - 20 reps - Supine Active Straight Leg Raise  - 1 x daily - 5 x weekly - 2 sets - 15 reps  ASSESSMENT:  CLINICAL IMPRESSION:  Pt continues to exhibit improved endurance, although still limited, by ambulating up to 1.6 mph and at 2% elevation. Following intervention she was still able to complete more therex, where DF continues to be greatest strength deficit. The patient will benefit from further skilled PT to improve strength, balance and mobility to further improve deficits and make progress toward goals.  OBJECTIVE IMPAIRMENTS Abnormal gait, decreased activity tolerance, decreased balance, decreased coordination, decreased endurance, decreased mobility, difficulty  walking, decreased ROM, decreased strength, hypomobility, increased edema, impaired flexibility, impaired UE functional use, improper body mechanics, postural dysfunction, and pain.   ACTIVITY LIMITATIONS carrying, lifting, standing, stairs, bed mobility, bathing, dressing, reach over head, hygiene/grooming, and locomotion level  PARTICIPATION LIMITATIONS: meal prep, cleaning, laundry, driving, shopping, community activity, and yard work  PERSONAL FACTORS Age, Past/current experiences, Sex, and 3+ comorbidities: hypothyroidism, and Raynaud's disease, history of cervical fusion in 2010 C4-C7, R side MCA CVA 02/26/21  are also affecting patient's functional outcome.   REHAB POTENTIAL: Good  CLINICAL DECISION MAKING: Stable/uncomplicated  EVALUATION COMPLEXITY: Moderate   GOALS: Goals reviewed with patient? Yes  SHORT TERM GOALS: Target date: 12/27/2021  Patient will be independent in home exercise program to improve strength/mobility for better functional independence with ADLs. Baseline: to be initiated next 1-2 sessions; 8/15: initiated; 9/14: Pt reports she indep Goal status: MET LONG TERM GOALS: Target date: 01/24/2022   Patient will increase FOTO score to equal to or greater than  63  to demonstrate statistically significant improvement in mobility and quality of life.  Baseline: 8/15: 47; 9/14: 66% Goal status: MET  2.   Patient will increase 10 meter walk test to >1.24ms as to improve gait speed for better community ambulation and to reduce fall risk. Baseline: 0.83 m/s with QC; 9/14: 0.92 m/s with QC, without AD 0.91 m/s Goal status: Partially MET  3.  Patient will increase BLE gross strength by 1/2 point on MMT as to improve functional strength for independent gait, increased standing tolerance and increased ADL ability. Baseline: grossly 4+/5 BLE, with exception of R ankle testing deferred at eval; 9/15: grossly 4+/5 B including R ankle Goal status: IN PROGRESS  4.   Patient  will demonstrate an improved [place] Balance Score of at least 3 points or greater as to demonstrate improved balance with ADLs such as sitting/standing and transfer balance and reduced fall risk.  Baseline: 8/15: 51; 9/14: 54/56 Goal status: MET  5.  Patient will increase lower extremity functional scale to >60/80 to demonstrate improved functional mobility and increased tolerance with ADLs.  Baseline: 44/80; 9/14: 50/80 Goal status: INITIAL   6.  Patient will increase six minute walk test distance to >1300 ft for improve gait ability and return to PLOF to complete her walks in her neighborhood.  Baseline: 1194 ft without AD, with SBA, pt reported feeling fatigued in hips around minutes 4 Goal status: INITIAL    PLAN: PT FREQUENCY: 2x/week  PT DURATION: 12 weeks  PLANNED  INTERVENTIONS: Therapeutic exercises, Therapeutic activity, Neuromuscular re-education, Balance training, Gait training, Patient/Family education, Self Care, Joint mobilization, Joint manipulation, Stair training, Vestibular training, Canalith repositioning, Visual/preceptual remediation/compensation, Orthotic/Fit training, DME instructions, Electrical stimulation, Spinal mobilization, Cryotherapy, Moist heat, Splintting, Taping, Ultrasound, Manual therapy, and Re-evaluation  PLAN FOR NEXT SESSION:  balance and strength training, gentle mobility R ankle, isometrics, gait training, continue plan   Zollie Pee, PT 01/01/2022, 5:06 PM

## 2022-01-02 NOTE — Therapy (Signed)
OUTPATIENT OCCUPATIONAL THERAPY NEURO TREATMENT NOTE Patient Name: Teresa [name] MRN: [id number] DOB:May 10, 1940, 81 y.o., female Today's Date: 12/13/2021  PCP: Dr. [name] REFERRING PROVIDER: Dr. [name]     OT End of Session - 01/02/22 0815     Visit Number 14    Number of Visits 72    Date for OT Re-Evaluation 01/31/22    Authorization Time Period Reporting period beginning 12/25/21    OT Start Time 1145    OT Stop Time 1230    OT Time Calculation (min) 45 min    Activity Tolerance Patient tolerated treatment well    Behavior During Therapy [facility] for tasks assessed/performed             Past Medical History:  Diagnosis Date   Cancer ([place])    skin   Hypothyroidism    Raynaud's disease    Past Surgical History:  Procedure Laterality Date   [place]   BACK SURGERY  2010   Cervical fusion C4-5-6-7   CATARACT EXTRACTION, BILATERAL Bilateral 10/2016   CHOLECYSTECTOMY  1995   COLONOSCOPY WITH PROPOFOL N/A 12/08/2018   Procedure: COLONOSCOPY WITH PROPOFOL;  Surgeon: Toledo, Benay Pike, MD;  Location: [facility] ENDOSCOPY;  Service: Gastroenterology;  Laterality: N/A;   EYE SURGERY     Patient Active Problem List   Diagnosis Date Noted   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 in [place] where she was admitted and after acute care she went to inpatient rehab.  Following inpt rehab, pt went home and had home health.   PRECAUTIONS: fall  SUBJECTIVE:  Pt reports doing  well today, still some slight pain in the L shoulder. PAIN:  Are you having pain? Yes: NPRS scale: 3/10 Pain location:   L upper trap, pec Pain description: tightness, sore, tender Aggravating factors: N/A Relieving factors: rest, heat, otc pain meds   OBJECTIVE:  L grip 6, R grip 41 L lateral pinch 5, R 13 L 3 point pinch 4, R 14  L 9 hole 1 min 43 sec. L shoulder active flexion 0-105, passive 0-125  TODAY'S TREATMENT:  Moist heat applied to L shoulder  Therapeutic Exercise: Performed gentle passive scapular mobility for protraction/retraction/elevation/depression.  Performed table slides for forward flex, and large arc for horiz abd/add; pt was cued to perform in a pain free range with a light stretch and advised to avoid cane stretches until pain decreases further in the L shoulder.   End of session performed gentle AAROM for L shoulder forward flex, abd, ER, and horiz abd/add within a pain free range with slow rocking; cues for relaxation to limit guarding.  Facilitated L grip strengthening with moderate resistance on hand gripper (1 green band); pt completed 3 sets 10 reps beginning of session, 2 set 10 end of session.  Pt requires initial set up of hand in hand gripper, min vc for rest breaks between sets and adjusting grip on gripper to reduce dropping and to ensure engagement of all digits when gripping.  Provided passive L forearm supination stretch in prep for storage activities using L hand noted below.  Instructed pt in self passive stretching in this plane and encouraged pt complete at home daily; moderate tactile cues for return demo.  Neuro re-ed: Facilitated L hand dexterity skills with manipulation of small pegs, initially removing pegs from board with a 2 and 3 point pinch pattern, progressing to placing pegs in board with pick up from rubber non-skid mat on table top.  Pt practiced removing pegs 1 at a time and storing up to 3 in hand, pt requiring cues for tight composite  fist to limit dropping.  Practiced same skills with ball pegs d/t multiple dropped small pegs with storage attempts.  Pt was able to store up to 5 ball pegs in hand without dropping.  Practiced scooping 5 from table top with cues for technique.    PATIENT EDUCATION: Education details: LUE Dent skills Person educated: pt Education method: Explanation and Verbal cues Education comprehension: verbalized understanding   HOME EXERCISE PROGRAM Continue to engage LUE into ADLs; continue gripping and pinching exercises for LUE, and L shoulder AROM/AAROM, crochet , typing    OT Long Term Goals -       OT LONG TERM GOAL #1   Title Pt will be independent with home exercise program.    Baseline Eval: no current program, 10th visit:  continue to add new exercises as pt progresses, 20th:  continue to update HEP; 08/17/21: continue to progress HEP when indicated.  Visit 40: adding new exercises as pt progresses; 12/25/21: ongoing with progressions   Time 12    Period Weeks    Status On-going    Target Date 01/31/22      OT LONG TERM GOAL #2   Title Pt will complete UB and LB dressing with modified independence including buttons, snaps and zippers.    Baseline requires min assist at eval, 10th visit: occasional assist with buttons, 20th:  able to perform one handed, but difficulty with bilateral UE; 08/17/21: pt reports inconsistent with 1 hand, reviewed techniques this visit, Visit 40:  Pt requires assist with bra   Time 12    Period Weeks    Status MET   Target Date 11/08/21     OT LONG TERM GOAL #3   Title Pt will perform shower transfer with modified independence.    Baseline Pt requires supervision to min assist for shower transfer at home. 10th visit: supervision; 08/17/21: supv .  Visit 40:  Pt had met goal but had recent fall and now requiring min guard to supv   Time 6    Period Weeks    Status  Met    Target Date 11/08/21      OT LONG TERM GOAL #4   Title Pt will improve L hand grip by  10# to assist with holding items in left hand securely.    Baseline no grip in left hand at eval, 10th visit:  improved flexion but still working towards composite fisting and grip. 20th:  continues to demo decreased grip; 08/17/21: active digit flexion improving, but not yet able to register grip on dynamometer; 09/04/21: L grip 1# 7/17:  5#; 12/25/21: L grip 6#   Time 12    Period Weeks    Status On-going    Target Date 01/31/22      OT LONG TERM GOAL #5   Title Pt will improve left shoulder flexion to 100 degrees or better to improve reaching  to obtain self care items from shelf/shoulder height.    Baseline difficulty with reach, shoulder flexion to 47 degrees; 08/17/21: L shoulder flexion 85, but not yet able to consistently hold ADL supplies in L hand when reaching; 09/04/21: flexion 85 7/17:  shoulder flexion to 90; 12/25/21: 105 (P 125)    Time 12    Period Weeks    Status On-going    Target Date 01/31/22      OT LONG TERM GOAL #6   Title Pt will improve FOTO score to 47 or above to demonstrate a clinically relevant change in function to impact ADL tasks.    Baseline score of 30 at eval; 08/17/21: FOTO: 42; 09/04/21: FOTO : 54 , FOTO 7/7:  60; 12/25/21: 59   Time 12    Period Weeks    Status  MET/ongoing    Target Date 01/31/22      OT LONG TERM GOAL #7   Title Pt will demonstrate ability to pick up small objects and complete 9 hole peg test in less than 1 min 30 sec (revised from under 2 min)   Baseline unable to perform at eval, 10th visit: still unable to pick up small pegs, 20th: unable to pick up pegs but improving; 08/17/21: not attempted d/t time constraints, will assess next visit; 09/04/21: 9 hole peg test in 5 min 53 sec;  7/17:  Pt unable to complete this date but was able to place 6 of 9 pegs in 4 mins 20 secs; 12/25/21: L 1 min 43 sec   Time 12    Period Weeks    Status Revised/On-going    Target Date 01/31/2022             Plan -     Clinical Impression Statement Pt  continues to report some soreness in L shoulder, though pt responded well to heat and AAROM this date.  Encouraged pt transition to table slides in place of cane stretches until pain subsides further.  Pt agreed.  Pt has progressed with hand gripper on moderate resistance with 1 green band, though her first couple of sets she is able to squeeze the gripper ~25% of the way.  With repetition, pt squeezes gripper 75% of the way by the 4th and 5th set.  Pt continues to progress with L hand dexterity.  Pt can remove small pegs from pegboard, and can place pegs into board with multiple attempts from a non-skid surface.  Storing multiple small pegs in hand continues to be difficult d/t weak composite fist as pegs slide out from ulnar side of hand. Pt was able to store up to 3 in hand today.  Pt is more accurate to place and remove ball pegs with L hand, and can more easily store this size peg in hand, storing up to 5 at a time without dropping.  Pt will continue to work towards goals in plan of care to improve left UE function, decrease pain and increase active normal movement patterns for daily tasks.    OT Occupational Profile and History Detailed Assessment- Review of Records and additional review of physical, cognitive, psychosocial history related to current functional performance    Occupational performance deficits (Please refer to evaluation for details): ADL's;IADL's;Leisure;Rest and Sleep    Body Structure / Function / Physical Skills ADL;Coordination;Endurance;GMC;UE functional use;Balance;IADL;Pain;Dexterity;FMC;Strength;Edema;Mobility;ROM    Psychosocial Skills Environmental  Adaptations;Habits;Routines and Behaviors    Rehab Potential Good    Clinical Decision Making Several treatment options, min-mod task modification  necessary   Comorbidities Affecting Occupational Performance: May have comorbidities impacting occupational performance    Modification or Assistance to Complete Evaluation   Min-Moderate modification of tasks or assist with assess necessary to complete eval    OT Frequency 2x / week    OT Duration 12 weeks    OT Treatment/Interventions Self-care/ADL training;Cryotherapy;Paraffin;Therapeutic exercise;DME and/or AE instruction;Functional Mobility Training;Balance training;Electrical Stimulation;Ultrasound;Neuromuscular education;Manual Therapy;Splinting;Moist Heat;Contrast Bath;Passive range of motion;Therapeutic activities;Patient/family education;Coping strategies training    Plan OT recert    Consulted and Agree with Plan of Care Patient            Leta Speller, MS, OTR/L  Darleene Cleaver, OT 01/02/2022, 8:16 AM

## 2022-01-03 ENCOUNTER — Ambulatory Visit: Payer: Medicare PPO

## 2022-01-03 DIAGNOSIS — R2681 Unsteadiness on feet: Secondary | ICD-10-CM

## 2022-01-03 DIAGNOSIS — M6281 Muscle weakness (generalized): Secondary | ICD-10-CM | POA: Diagnosis not present

## 2022-01-03 DIAGNOSIS — R278 Other lack of coordination: Secondary | ICD-10-CM

## 2022-01-03 LAB — CUP PACEART REMOTE DEVICE CHECK
Date Time Interrogation Session: 20231009225039
Implantable Pulse Generator Implant Date: 19440924

## 2022-01-03 NOTE — Therapy (Signed)
OUTPATIENT PHYSICAL THERAPY LOWER EXTREMITY TREATMENT    Patient Name: Teresa [name] MRN: [id number] DOB:[birth year], 81 y.o., female Today's Date: 01/03/2022   PT End of Session - 01/03/22 1549     Visit Number 17    Number of Visits 25    Date for PT Re-Evaluation 01/24/22    PT Start Time 1151    PT Stop Time 1230    PT Time Calculation (min) 39 min    Equipment Utilized During Treatment Gait belt    Activity Tolerance Patient tolerated treatment well;Patient limited by fatigue    Behavior During Therapy WFL for tasks assessed/performed                             Past Medical History:  Diagnosis Date   Cancer ([place])    skin   Hypothyroidism    Raynaud's disease    Past Surgical History:  Procedure Laterality Date   [place]   BACK SURGERY  2010   Cervical fusion C4-5-6-7   CATARACT EXTRACTION, BILATERAL Bilateral 10/2016   CHOLECYSTECTOMY  1995   COLONOSCOPY WITH PROPOFOL N/A 12/08/2018   Procedure: COLONOSCOPY WITH PROPOFOL;  Surgeon: Toledo, Benay Pike, MD;  Location: [facility] ENDOSCOPY;  Service: Gastroenterology;  Laterality: N/A;   EYE SURGERY     Patient Active Problem List   Diagnosis Date Noted   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 Pt reports she is feeling fine currently. Pt reports no falls/stumbles. She presents with bruise on L temple, reports she thinks she bumped it with hair  brush.  PAIN:  Are you having pain? None currently NPRS scale: Not rated/10 Pain location:  Pain description:  Aggravating factors:  Relieving factors:   PERTINENT HISTORY: On June 24th, 2023 pt fell pulling up her pajama pants at home, slipped, and fractured her R ankle (closed traumatic nondisplaced fracture of posterior malleolus of R tibia). Pt had been working with PT for R side MCA CVA (02/26/21). Pt now returns to PT. She is WBAT per chart (Dr. Doy Hutching). Pt uses a quad cane for ambulation. She is not wearing an ankle brace, but does report she wears a brace when out for longer periods of time (does not currently have it with her for exam). She reports she has difficulty with balance with turning and feels she is dragging her LLE. Pt reports she has no ankle pain. Feels her balance is generally off. She reports she has been moving her R ankle and walking on it for exercise. Pt is not aware of any other restrictions other than to wear her brace. Pt reports she was discharged from working with her physician for her ankle due to good progress.  Pt has not had any other falls since the one in June, just stumbling with turns. She continues to work with OT for her UE s/p CVA. PMH includes: skin cancer, hypothyroidism, and Raynaud's disease, history of  cervical fusion in 2010 C4-C7, R side MCA CVA 02/26/21.  PRECAUTIONS: Fall  WEIGHT BEARING RESTRICTIONS Yes WBAT, pt has R ankle brace but reports only wears for long-distance walking  FALLS:  Has patient fallen in last 6 months? Yes. Number of falls 1  LIVING ENVIRONMENT: Lives with: lives with their spouse Lives in: House/apartment Stairs: Yes: External: 1 steps; none Does have second floor, with handrail going up on L, but hasn't gone upstairs per report Has following equipment at home: Quad cane small base, Environmental consultant - 2 wheeled, Wheelchair (manual), and Grab bars   PLOF: Independent with household mobility with device and reports husband has  helped with bathing since stroke  PATIENT GOALS  Improve her balance with turning and not dragging her feet, "the left leg is dragging again"   OBJECTIVE:  Taken at eval unless indicated otherwise DIAGNOSTIC FINDINGS:  Information via chart - Scarlett Presto, PA 10/23/2021: "Xrays of the right ankle were ordered and interpreted 10/23/2021, with 3 views using AP, lateral, and mortise views. Xrays revealed the right ankle with a posterior malleolus nondisplaced fracture with no angulation or rotation. The ankle mortise is well-maintained. There is significant callus formation or fracture healing noted on today's x-rays."    PATIENT SURVEYS:  LEFS 44/80   FOTO: 47 (taken 11/06/2021)  COGNITION:  Overall cognitive status: Within functional limits for tasks assessed     SENSATION: WFL BLE, denies any n/t in BLE  EDEMA:  None observed currently, but does report swelling later in the day in her B ankles "just with the socks"  MUSCLE LENGTH: Hamstrings: Right -18 deg; Left -19 deg deg from neutral   POSTURE: rounded shoulders and increased thoracic kyphosis  PALPATION: Medial lower leg just superior to medial malleolus feels "bruised"  LOWER EXTREMITY ROM:  Active ROM Right eval Left eval  Ankle dorsiflexion 1 5  Ankle plantarflexion ~50 ~50   (Blank rows = not tested)  LOWER EXTREMITY MMT:   BLE grossly 4+/5 (*did not test R ankle musculature today)   FUNCTIONAL TESTS:  10 meter walk test: 0.83 m/s with QC  Berg: 51/56 taken 11/06/2021  TODAY'S TREATMENT:  TherEx:  Treadmill endurance training @ 2% elevation, 1.7 mph x 6 min- then pt performed cool down for 1 min with gradually decreasing speed. Pt fatigued by end of intervention   Seated DF no weights 20x B Seated DF 20x B with 3# AW Standing DF 20x B then 20x each LE isolated Standing single leg heel raise 15x each LE   STS 2x15  NMR: SLB 2x30 sec each LE, intermittent UE support toe/touch  Standing on  bosu (round side up) with and without dual cog task, up to mod assist x several min  Obstacle course to promote LUE attention/awareness with pt navigating through narrowly placed cones with and without dual motor task    PATIENT EDUCATION:  Education details: Pt educated throughout session about proper posture and technique with exercises. Improved exercise technique, movement at target joints, use of target muscles after min to mod verbal, visual, tactile cues.  Person educated: Patient Education method: Explanation, Demonstration, and Verbal cues Education comprehension: verbalized understanding, returned demonstration, and needs further education    HOME EXERCISE PROGRAM:   10/5: Access Code: [email or web address] URL: [email or web address] Date: 12/27/2021 Prepared by: Ricard Dillon  Exercises - Standing Single Leg Stance with Counter Support  - 1 x daily - 7 x weekly - 2 sets - 2 reps - 30 hold   8/15:  Access Code: [email or web address] URL: [email or web address] Date: 11/06/2021 Prepared by: Ricard Dillon  Exercises - Supine Single Leg Ankle Pumps  - 1 x daily - 7 x weekly - 2 sets - 20 reps - Supine Active Straight Leg Raise  - 1 x daily - 5 x weekly - 2 sets - 15 reps  ASSESSMENT:  CLINICAL IMPRESSION:  Pt able to continue to advance walking speed at 2% elevation as well as duration. While she shows progress, pt required up to mod assist with compliant surface balance intervention reporting fatigue in B ankles. Pt also continues to have difficulty with standing DF. The patient will benefit from further skilled PT to improve strength, balance and mobility to further improve deficits and make progress toward goals.  OBJECTIVE IMPAIRMENTS Abnormal gait, decreased activity tolerance, decreased balance, decreased coordination, decreased endurance, decreased mobility, difficulty walking, decreased ROM, decreased strength, hypomobility, increased edema, impaired flexibility,  impaired UE functional use, improper body mechanics, postural dysfunction, and pain.   ACTIVITY LIMITATIONS carrying, lifting, standing, stairs, bed mobility, bathing, dressing, reach over head, hygiene/grooming, and locomotion level  PARTICIPATION LIMITATIONS: meal prep, cleaning, laundry, driving, shopping, community activity, and yard work  PERSONAL FACTORS Age, Past/current experiences, Sex, and 3+ comorbidities: hypothyroidism, and Raynaud's disease, history of cervical fusion in 2010 C4-C7, R side MCA CVA 02/26/21  are also affecting patient's functional outcome.   REHAB POTENTIAL: Good  CLINICAL DECISION MAKING: Stable/uncomplicated  EVALUATION COMPLEXITY: Moderate   GOALS: Goals reviewed with patient? Yes  SHORT TERM GOALS: Target date: 12/27/2021  Patient will be independent in home exercise program to improve strength/mobility for better functional independence with ADLs. Baseline: to be initiated next 1-2 sessions; 8/15: initiated; 9/14: Pt reports she indep Goal status: MET LONG TERM GOALS: Target date: 01/24/2022   Patient will increase FOTO score to equal to or greater than  63  to demonstrate statistically significant improvement in mobility and quality of life.  Baseline: 8/15: 47; 9/14: 66% Goal status: MET  2.   Patient will increase 10 meter walk test to >1.89m/s as to improve gait speed for better community ambulation and to reduce fall risk. Baseline: 0.83 m/s with QC; 9/14: 0.92 m/s with QC, without AD 0.91 m/s Goal status: Partially MET  3.  Patient will increase BLE gross strength by 1/2 point on MMT as to improve functional strength for independent gait, increased standing tolerance and increased ADL ability. Baseline: grossly 4+/5 BLE, with exception of R ankle testing deferred at eval; 9/15: grossly 4+/5 B including R ankle Goal status: IN PROGRESS  4.   Patient will demonstrate an improved Berg Balance Score of at least 3 points or greater as to  demonstrate improved balance with ADLs such as sitting/standing and transfer balance and reduced fall risk.  Baseline: 8/15: 51; 9/14: 54/56 Goal status: MET  5.  Patient will increase lower extremity functional scale to >60/80 to demonstrate improved functional mobility and increased tolerance with ADLs.  Baseline: 44/80; 9/14: 50/80 Goal status: INITIAL   6.  Patient will increase six minute walk test distance to >1300 ft for improve gait ability and return to PLOF to complete her walks in her neighborhood.  Baseline: 1194 ft without AD, with SBA, pt reported feeling fatigued in hips around minutes 4 Goal status: INITIAL    PLAN: PT FREQUENCY: 2x/week  PT DURATION: 12 weeks  PLANNED INTERVENTIONS: Therapeutic exercises, Therapeutic activity, Neuromuscular re-education, Balance training, Gait training, Patient/Family education, Self Care, Joint mobilization, Joint manipulation, Stair training, Vestibular training,  Canalith repositioning, Visual/preceptual remediation/compensation, Orthotic/Fit training, DME instructions, Electrical stimulation, Spinal mobilization, Cryotherapy, Moist heat, Splintting, Taping, Ultrasound, Manual therapy, and Re-evaluation  PLAN FOR NEXT SESSION:  balance and strength training, gentle mobility R ankle, isometrics, gait training, continue plan   Zollie Pee, PT 01/03/2022, 3:54 PM

## 2022-01-07 ENCOUNTER — Ambulatory Visit (INDEPENDENT_AMBULATORY_CARE_PROVIDER_SITE_OTHER): Payer: Medicare PPO

## 2022-01-07 DIAGNOSIS — I639 Cerebral infarction, unspecified: Secondary | ICD-10-CM | POA: Diagnosis not present

## 2022-01-08 ENCOUNTER — Ambulatory Visit: Payer: Medicare PPO

## 2022-01-08 ENCOUNTER — Telehealth: Payer: Self-pay

## 2022-01-08 DIAGNOSIS — R278 Other lack of coordination: Secondary | ICD-10-CM

## 2022-01-08 DIAGNOSIS — M6281 Muscle weakness (generalized): Secondary | ICD-10-CM

## 2022-01-08 DIAGNOSIS — I63511 Cerebral infarction due to unspecified occlusion or stenosis of right middle cerebral artery: Secondary | ICD-10-CM

## 2022-01-08 DIAGNOSIS — R262 Difficulty in walking, not elsewhere classified: Secondary | ICD-10-CM

## 2022-01-08 DIAGNOSIS — R2681 Unsteadiness on feet: Secondary | ICD-10-CM

## 2022-01-08 NOTE — Telephone Encounter (Signed)
Contacted Dr. Park Meo office over secure line regarding referral for Pelvic PT. Spoke with nursing who informed PT she will pass information on to Dr. Doy Hutching.  Ricard Dillon PT, DPT

## 2022-01-08 NOTE — Therapy (Signed)
OUTPATIENT PHYSICAL THERAPY LOWER EXTREMITY TREATMENT    Patient Name: Teresa [name] MRN: [id number] DOB:Feb 22, 1941, 81 y.o., female Today's Date: 01/08/2022   PT End of Session - 01/08/22 1546     Visit Number 18    Number of Visits 25    Date for PT Re-Evaluation 01/24/22    PT Start Time 1105    PT Stop Time 1144    PT Time Calculation (min) 39 min    Equipment Utilized During Treatment Gait belt    Activity Tolerance Patient tolerated treatment well    Behavior During Therapy WFL for tasks assessed/performed                              Past Medical History:  Diagnosis Date   Cancer ([place])    skin   Hypothyroidism    Raynaud's disease    Past Surgical History:  Procedure Laterality Date   [place]   BACK SURGERY  2010   Cervical fusion C4-5-6-7   CATARACT EXTRACTION, BILATERAL Bilateral 10/2016   CHOLECYSTECTOMY  1995   COLONOSCOPY WITH PROPOFOL N/A 12/08/2018   Procedure: COLONOSCOPY WITH PROPOFOL;  Surgeon: Toledo, Benay Pike, MD;  Location: [facility] ENDOSCOPY;  Service: Gastroenterology;  Laterality: N/A;   EYE SURGERY     Patient Active Problem List   Diagnosis Date Noted   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 Reports no stumbles/falls. She has been walking more but still feels exhuasted from it.  PAIN:  Are you having pain? None currently  NPRS scale: Not rated/10 Pain location: B shoulders Pain description:  Aggravating factors:  Relieving factors:   PERTINENT HISTORY: On June 24th, 2023 pt fell pulling up her pajama pants at home, slipped, and fractured her R ankle (closed traumatic nondisplaced fracture of posterior malleolus of R tibia). Pt had been working with PT for R side MCA CVA (02/26/21). Pt now returns to PT. She is WBAT per chart (Dr. Doy Hutching). Pt uses a quad cane for ambulation. She is not wearing an ankle brace, but does report she wears a brace when out for longer periods of time (does not currently have it with her for exam). She reports she has difficulty with balance with turning and feels she is dragging her LLE. Pt reports she has no ankle pain. Feels her balance is generally off. She reports she has been moving her R ankle and walking on it for exercise. Pt is not aware of any other restrictions other than to wear her brace. Pt reports she was discharged from working with her physician for her ankle due to good progress.  Pt has not had any other falls since the one in June, just stumbling with turns. She continues to work with OT for her UE s/p CVA. PMH includes: skin cancer, hypothyroidism, and Raynaud's disease, history of cervical fusion in 2010 C4-C7, R  side MCA CVA 02/26/21.  PRECAUTIONS: Fall  WEIGHT BEARING RESTRICTIONS Yes WBAT, pt has R ankle brace but reports only wears for long-distance walking  FALLS:  Has patient fallen in last 6 months? Yes. Number of falls 1  LIVING ENVIRONMENT: Lives with: lives with their spouse Lives in: House/apartment Stairs: Yes: External: 1 steps; none Does have second floor, with handrail going up on L, but hasn't gone upstairs per report Has following equipment at home: Quad cane small base, Environmental consultant - 2 wheeled, Wheelchair (manual), and Grab bars   PLOF: Independent with household mobility with device and reports husband has helped with bathing since stroke  PATIENT  GOALS  Improve her balance with turning and not dragging her feet, "the left leg is dragging again"   OBJECTIVE:  Taken at eval unless indicated otherwise DIAGNOSTIC FINDINGS:  Information via chart - Scarlett Presto, PA 10/23/2021: "Xrays of the right ankle were ordered and interpreted 10/23/2021, with 3 views using AP, lateral, and mortise views. Xrays revealed the right ankle with a posterior malleolus nondisplaced fracture with no angulation or rotation. The ankle mortise is well-maintained. There is significant callus formation or fracture healing noted on today's x-rays."    PATIENT SURVEYS:  LEFS 44/80   FOTO: 47 (taken 11/06/2021)  COGNITION:  Overall cognitive status: Within functional limits for tasks assessed     SENSATION: WFL BLE, denies any n/t in BLE  EDEMA:  None observed currently, but does report swelling later in the day in her B ankles "just with the socks"  MUSCLE LENGTH: Hamstrings: Right -18 deg; Left -19 deg deg from neutral   POSTURE: rounded shoulders and increased thoracic kyphosis  PALPATION: Medial lower leg just superior to medial malleolus feels "bruised"  LOWER EXTREMITY ROM:  Active ROM Right eval Left eval  Ankle dorsiflexion 1 5  Ankle plantarflexion ~50 ~50   (Blank rows = not tested)  LOWER EXTREMITY MMT:   BLE grossly 4+/5 (*did not test R ankle musculature today)   FUNCTIONAL TESTS:  10 meter walk test: 0.83 m/s with QC  Berg: 51/56 taken 11/06/2021  TODAY'S TREATMENT:  TherEx:  Treadmill endurance training @ 2% elevation, amb up to 1.9 mph x 7 min. Pt states intervention was fine.  Ambulation for 2x148 ft for cool-down and continued endurance training, SBA due to LE fatigue  Seated DF 20x 2 sets each LE one set with 4# weights donned and second set with 5# AW donned  Rocker board FWD/BCKWD LTL 10x for each direction  LAQ with 5# AW each LE 10x each LE . Produces R hip ache, goes away out of position with  discontinuing exercise  Hip ER with 5# AW donned 15x each LE.  Produces R hip ache, goes away out of position with discontinuing exercise  Hip IR with 5# AW 15x each LE - no discomfort  Single leg heel raise 15x each LE. Pt rates easy   NMR SLB 30 sec each LE    PATIENT EDUCATION:  Education details: Pt educated throughout session about proper posture and technique with exercises. Improved exercise technique, movement at target joints, use of target muscles after min to mod verbal, visual, tactile cues.  Person educated: Patient Education method: Explanation, Demonstration, and Verbal cues Education comprehension: verbalized understanding, returned demonstration, and needs further education    HOME EXERCISE PROGRAM:   10/5: Access Code: [email or web address] URL: [email or web address] Date: 12/27/2021 Prepared by: Ricard Dillon  Exercises - Standing Single Leg Stance with Counter Support  -  1 x daily - 7 x weekly - 2 sets - 2 reps - 30 hold   8/15: Access Code: [email or web address] URL: [email or web address] Date: 11/06/2021 Prepared by: Ricard Dillon  Exercises - Supine Single Leg Ankle Pumps  - 1 x daily - 7 x weekly - 2 sets - 20 reps - Supine Active Straight Leg Raise  - 1 x daily - 5 x weekly - 2 sets - 15 reps  ASSESSMENT:  CLINICAL IMPRESSION:  Pt shows further improvement with activity tolerance and endurance related activities this session. She also shows good progression in ankle strength, performing DF intervention with increased weight. While pt progressing, endurance is still most limiting factor and pt not yet at PLOF. The patient will benefit from further skilled PT to improve strength, balance and mobility to further improve deficits and make progress toward goals.  OBJECTIVE IMPAIRMENTS Abnormal gait, decreased activity tolerance, decreased balance, decreased coordination, decreased endurance, decreased mobility, difficulty walking, decreased ROM,  decreased strength, hypomobility, increased edema, impaired flexibility, impaired UE functional use, improper body mechanics, postural dysfunction, and pain.   ACTIVITY LIMITATIONS carrying, lifting, standing, stairs, bed mobility, bathing, dressing, reach over head, hygiene/grooming, and locomotion level  PARTICIPATION LIMITATIONS: meal prep, cleaning, laundry, driving, shopping, community activity, and yard work  PERSONAL FACTORS Age, Past/current experiences, Sex, and 3+ comorbidities: hypothyroidism, and Raynaud's disease, history of cervical fusion in 2010 C4-C7, R side MCA CVA 02/26/21  are also affecting patient's functional outcome.   REHAB POTENTIAL: Good  CLINICAL DECISION MAKING: Stable/uncomplicated  EVALUATION COMPLEXITY: Moderate   GOALS: Goals reviewed with patient? Yes  SHORT TERM GOALS: Target date: 12/27/2021  Patient will be independent in home exercise program to improve strength/mobility for better functional independence with ADLs. Baseline: to be initiated next 1-2 sessions; 8/15: initiated; 9/14: Pt reports she indep Goal status: MET LONG TERM GOALS: Target date: 01/24/2022   Patient will increase FOTO score to equal to or greater than  63  to demonstrate statistically significant improvement in mobility and quality of life.  Baseline: 8/15: 47; 9/14: 66% Goal status: MET  2.   Patient will increase 10 meter walk test to >1.1m/s as to improve gait speed for better community ambulation and to reduce fall risk. Baseline: 0.83 m/s with QC; 9/14: 0.92 m/s with QC, without AD 0.91 m/s Goal status: Partially MET  3.  Patient will increase BLE gross strength by 1/2 point on MMT as to improve functional strength for independent gait, increased standing tolerance and increased ADL ability. Baseline: grossly 4+/5 BLE, with exception of R ankle testing deferred at eval; 9/15: grossly 4+/5 B including R ankle Goal status: IN PROGRESS  4.   Patient will demonstrate an  improved Berg Balance Score of at least 3 points or greater as to demonstrate improved balance with ADLs such as sitting/standing and transfer balance and reduced fall risk.  Baseline: 8/15: 51; 9/14: 54/56 Goal status: MET  5.  Patient will increase lower extremity functional scale to >60/80 to demonstrate improved functional mobility and increased tolerance with ADLs.  Baseline: 44/80; 9/14: 50/80 Goal status: INITIAL   6.  Patient will increase six minute walk test distance to >1300 ft for improve gait ability and return to PLOF to complete her walks in her neighborhood.  Baseline: 1194 ft without AD, with SBA, pt reported feeling fatigued in hips around minutes 4 Goal status: INITIAL    PLAN: PT FREQUENCY: 2x/week  PT DURATION: 12 weeks  PLANNED INTERVENTIONS: Therapeutic exercises, Therapeutic activity, Neuromuscular  re-education, Balance training, Gait training, Patient/Family education, Self Care, Joint mobilization, Joint manipulation, Stair training, Vestibular training, Canalith repositioning, Visual/preceptual remediation/compensation, Orthotic/Fit training, DME instructions, Electrical stimulation, Spinal mobilization, Cryotherapy, Moist heat, Splintting, Taping, Ultrasound, Manual therapy, and Re-evaluation  PLAN FOR NEXT SESSION:  balance and strength training, gentle mobility R ankle, isometrics, gait training, endurance, continue plan   Zollie Pee, PT 01/08/2022, 3:54 PM

## 2022-01-09 NOTE — Therapy (Signed)
OUTPATIENT OCCUPATIONAL THERAPY NEURO TREATMENT NOTE Patient Name: Teresa [name] MRN: [id number] DOB:[birth year], 81 y.o., female Today's Date: 12/13/2021  PCP: Dr. [name] REFERRING PROVIDER: Dr. [name]     OT End of Session - 01/09/22 1342     Visit Number 63    Number of Visits 72    Date for OT Re-Evaluation 01/31/22    Authorization Time Period Reporting period beginning 12/25/21    OT Start Time 1147    OT Stop Time 1230    OT Time Calculation (min) 43 min    Activity Tolerance Patient tolerated treatment well    Behavior During Therapy [facility] for tasks assessed/performed             Past Medical History:  Diagnosis Date   Cancer ([place])    skin   Hypothyroidism    Raynaud's disease    Past Surgical History:  Procedure Laterality Date   [place]   BACK SURGERY  2010   Cervical fusion C4-5-6-7   CATARACT EXTRACTION, BILATERAL Bilateral 10/2016   CHOLECYSTECTOMY  1995   COLONOSCOPY WITH PROPOFOL N/A 12/08/2018   Procedure: COLONOSCOPY WITH PROPOFOL;  Surgeon: Toledo, Benay Pike, MD;  Location: [facility] ENDOSCOPY;  Service: Gastroenterology;  Laterality: N/A;   EYE SURGERY     Patient Active Problem List   Diagnosis Date Noted   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  Following inpt rehab, pt went home and had home health.   PRECAUTIONS: fall  SUBJECTIVE:  Pt reports no  shoulder pain today, but present in the neck/upper trap on the L side. PAIN:  Are you having pain? Yes: NPRS scale: 2/10 Pain location:   L upper trap, pec Pain description: tightness, sore, tender Aggravating factors: N/A Relieving factors: rest, heat, otc pain meds   OBJECTIVE:  L grip 6, R grip 41 L lateral pinch 5, R 13 L 3 point pinch 4, R 14  L 9 hole 1 min 43 sec. L shoulder active flexion 0-105, passive 0-125  TODAY'S TREATMENT:  Moist heat applied to L shoulder  Therapeutic Exercise: Performed gentle passive scapular mobility for protraction/retraction/elevation/depression.  Performed gentle passive stretching within pain free range for L shoulder all planes; good tolerance.  Completed LUE strengthening with 1# weight to perform pron/sup, elbow flex, chest press, ER with arm abducted to 80 (OT providing support for this position), and active shoulder flex and abd no resistance d/t limited range; pt completed all for 3 sets 7 reps each.  OT provided min guard for all exercises completed with 1# weight, and provided mod vc and tactile cues for form and technique.  Pt completed 3 sets 10 reps of grip squeezes using moderate resistance on hand gripper (1 green band).  Pt reported that she's having a more difficult time reaching the L hand behind her back.  OT reviewed cane stretches for shoulder ext and IR, completed 10 each with  min vc for technique.   Manual Therapy: Performed soft tissue massage to the L upper trap for reducing pain and muscle stiffness; good tolerance.  PATIENT EDUCATION: Education details: LUE [place] skills Person educated: pt Education method: Explanation and Verbal cues Education comprehension: verbalized understanding   HOME EXERCISE PROGRAM Continue to engage LUE into ADLs; continue gripping and pinching exercises for LUE, and L shoulder AROM/AAROM, crochet , typing    OT Long Term Goals -       OT LONG TERM GOAL #1   Title Pt will be independent with  home exercise program.    Baseline Eval: no current program, 10th visit:  continue to add new exercises as pt progresses, 20th:  continue to update HEP; 08/17/21: continue to progress HEP when indicated.  Visit 40: adding new exercises as pt progresses; 12/25/21: ongoing with progressions   Time 12    Period Weeks    Status On-going    Target Date 01/31/22      OT LONG TERM GOAL #2   Title Pt will complete UB and LB dressing with modified independence including buttons, snaps and zippers.    Baseline requires min assist at eval, 10th visit: occasional assist with buttons, 20th:  able to perform one handed, but difficulty with bilateral UE; 08/17/21: pt reports inconsistent with 1 hand, reviewed techniques this visit, Visit 40:  Pt requires assist with bra   Time 12    Period Weeks    Status MET   Target Date 11/08/21     OT LONG TERM GOAL #3   Title Pt will perform shower transfer with modified independence.    Baseline Pt requires supervision to min assist for shower transfer at home. 10th visit: supervision; 08/17/21: supv .  Visit 40:  Pt had met goal but had recent fall and now requiring min guard to supv   Time 6    Period Weeks    Status  Met    Target Date 11/08/21      OT LONG TERM GOAL #4   Title Pt will improve L hand grip by 10# to assist with holding items in left hand securely.    Baseline no grip in left hand at eval, 10th visit:  improved flexion but still working towards composite fisting and grip. 20th:  continues to demo decreased grip; 08/17/21: active digit flexion improving, but not yet able to register grip on dynamometer; 09/04/21: L grip 1# 7/17:  5#; 12/25/21: L grip 6#   Time 12    Period Weeks    Status On-going    Target Date 01/31/22      OT LONG TERM GOAL #5   Title Pt will improve left shoulder flexion to 100 degrees or better to improve reaching to obtain self care items from shelf/shoulder height.    Baseline difficulty with reach, shoulder flexion to 47  degrees; 08/17/21: L shoulder flexion 85, but not yet able to consistently hold ADL supplies in L hand when reaching; 09/04/21: flexion 85 7/17:  shoulder flexion to 90; 12/25/21: 105 (P 125)    Time 12    Period Weeks    Status On-going    Target Date 01/31/22      OT LONG TERM GOAL #6   Title Pt will improve FOTO score to 47 or above to demonstrate a clinically relevant change in function to impact ADL tasks.    Baseline score of 30 at eval; 08/17/21: FOTO: 42; 09/04/21: FOTO : 54 , FOTO  7/7:  60; 12/25/21: 59   Time 12    Period Weeks    Status  MET/ongoing    Target Date 01/31/22      OT LONG TERM GOAL #7   Title Pt will demonstrate ability to pick up small objects and complete 9 hole peg test in less than 1 min 30 sec (revised from under 2 min)   Baseline unable to perform at eval, 10th visit: still unable to pick up small pegs, 20th: unable to pick up pegs but improving; 08/17/21: not attempted d/t time constraints, will assess next visit; 09/04/21: 9 hole peg test in 5 min 53 sec;  7/17:  Pt unable to complete this date but was able to place 6 of 9 pegs in 4 mins 20 secs; 12/25/21: L 1 min 43 sec   Time 12    Period Weeks    Status Revised/On-going    Target Date 01/31/2022             Plan -     Clinical Impression Statement Pt reported that shoulder pain has improved, but L side of neck and upper trap continue to be sore.  Pt responded well to moist heat and soft tissue massage to manage this pain today.  Focus on LUE strengthening this date.  Pt tolerated a 1 # hand held weight for pron/sup, elbow flex, chest press, ER with arm abducted to 80 (OT providing support for this position), and active shoulder flex and abd no resistance d/t limited range; pt completed all for 3 sets 7 reps each.  OT provided min guard for all exercises completed with 1# weight, and provided mod vc and tactile cues for form and technique.  Reviewed cane stretches for IR and ext d/t pt reporting increased  difficulty reaching behind back with the L arm.  Able to return demo with min vc for technique.  Encouraged pt work on these particular cane stretches at home for improving flexibility for reach behind back.  Pt will continue to work towards goals in plan of care to improve left UE function, decrease pain and increase active normal movement patterns for daily tasks.    OT Occupational Profile and History Detailed Assessment- Review of Records and additional review of physical, cognitive, psychosocial history related to current functional performance    Occupational performance deficits (Please refer to evaluation for details): ADL's;IADL's;Leisure;Rest and Sleep    Body Structure / Function / Physical Skills ADL;Coordination;Endurance;GMC;UE functional use;Balance;IADL;Pain;Dexterity;FMC;Strength;Edema;Mobility;ROM    Psychosocial Skills Environmental  Adaptations;Habits;Routines and Behaviors    Rehab Potential Good    Clinical Decision Making Several treatment options, min-mod task modification necessary   Comorbidities Affecting Occupational Performance: May have comorbidities impacting occupational performance    Modification or Assistance to Complete Evaluation  Min-Moderate modification of tasks or assist with assess necessary to complete eval    OT Frequency 2x / week    OT Duration 12 weeks    OT Treatment/Interventions Self-care/ADL training;Cryotherapy;Paraffin;Therapeutic exercise;DME and/or AE instruction;Functional Mobility Training;Balance training;Electrical Stimulation;Ultrasound;Neuromuscular education;Manual Therapy;Splinting;Moist Heat;Contrast Bath;Passive range of motion;Therapeutic activities;Patient/family education;Coping strategies training    Plan OT recert    Consulted and Agree with Plan of Care Patient            Leta Speller, MS, OTR/L  Darleene Cleaver, OT 01/09/2022, 1:44 PM

## 2022-01-10 ENCOUNTER — Ambulatory Visit: Payer: Medicare PPO

## 2022-01-10 DIAGNOSIS — I63511 Cerebral infarction due to unspecified occlusion or stenosis of right middle cerebral artery: Secondary | ICD-10-CM

## 2022-01-10 DIAGNOSIS — M6281 Muscle weakness (generalized): Secondary | ICD-10-CM | POA: Diagnosis not present

## 2022-01-10 DIAGNOSIS — R2681 Unsteadiness on feet: Secondary | ICD-10-CM

## 2022-01-10 DIAGNOSIS — R262 Difficulty in walking, not elsewhere classified: Secondary | ICD-10-CM

## 2022-01-10 DIAGNOSIS — R278 Other lack of coordination: Secondary | ICD-10-CM

## 2022-01-10 NOTE — Therapy (Signed)
OUTPATIENT PHYSICAL THERAPY LOWER EXTREMITY TREATMENT    Patient Name: Teresa [name] MRN: [id number] DOB:27-Jun-1940, 81 y.o., female Today's Date: 01/10/2022   PT End of Session - 01/10/22 1504     Visit Number 19    Number of Visits 25    Date for PT Re-Evaluation 01/24/22    PT Start Time 1148    PT Stop Time 1229    PT Time Calculation (min) 41 min    Equipment Utilized During Treatment Gait belt    Activity Tolerance Patient tolerated treatment well    Behavior During Therapy WFL for tasks assessed/performed                               Past Medical History:  Diagnosis Date   Cancer ([place])    skin   Hypothyroidism    Raynaud's disease    Past Surgical History:  Procedure Laterality Date   [place]   BACK SURGERY  2010   Cervical fusion C4-5-6-7   CATARACT EXTRACTION, BILATERAL Bilateral 10/2016   CHOLECYSTECTOMY  1995   COLONOSCOPY WITH PROPOFOL N/A 12/08/2018   Procedure: COLONOSCOPY WITH PROPOFOL;  Surgeon: Toledo, Benay Pike, MD;  Location: [facility] ENDOSCOPY;  Service: Gastroenterology;  Laterality: N/A;   EYE SURGERY     Patient Active Problem List   Diagnosis Date Noted   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 She reports some shoulder pain today, has continued R hip ache with certain movements. She reports no falls/stumbles.  PAIN:   Are you having pain? None currently NPRS scale: Not rated/10 Pain location: B shoulders Pain description:  Aggravating factors:  Relieving factors:   PERTINENT HISTORY: On June 24th, 2023 pt fell pulling up her pajama pants at home, slipped, and fractured her R ankle (closed traumatic nondisplaced fracture of posterior malleolus of R tibia). Pt had been working with PT for R side MCA CVA (02/26/21). Pt now returns to PT. She is WBAT per chart (Dr. Doy Hutching). Pt uses a quad cane for ambulation. She is not wearing an ankle brace, but does report she wears a brace when out for longer periods of time (does not currently have it with her for exam). She reports she has difficulty with balance with turning and feels she is dragging her LLE. Pt reports she has no ankle pain. Feels her balance is generally off. She reports she has been moving her R ankle and walking on it for exercise. Pt is not aware of any other restrictions other than to wear her brace. Pt reports she was discharged from working with her physician for her ankle due to good progress.  Pt has not had any other falls since the one in June, just stumbling with turns. She continues to work with OT for her UE s/p CVA. PMH includes: skin cancer, hypothyroidism, and Raynaud's disease, history  of cervical fusion in 2010 C4-C7, R side MCA CVA 02/26/21.  PRECAUTIONS: Fall  WEIGHT BEARING RESTRICTIONS Yes WBAT, pt has R ankle brace but reports only wears for long-distance walking  FALLS:  Has patient fallen in last 6 months? Yes. Number of falls 1  LIVING ENVIRONMENT: Lives with: lives with their spouse Lives in: House/apartment Stairs: Yes: External: 1 steps; none Does have second floor, with handrail going up on L, but hasn't gone upstairs per report Has following equipment at home: Quad cane small base, Environmental consultant - 2 wheeled, Wheelchair (manual), and Grab bars   PLOF: Independent with household mobility with device and reports husband has helped  with bathing since stroke  PATIENT GOALS  Improve her balance with turning and not dragging her feet, "the left leg is dragging again"   OBJECTIVE:  Taken at eval unless indicated otherwise DIAGNOSTIC FINDINGS:  Information via chart - Scarlett Presto, PA 10/23/2021: "Xrays of the right ankle were ordered and interpreted 10/23/2021, with 3 views using AP, lateral, and mortise views. Xrays revealed the right ankle with a posterior malleolus nondisplaced fracture with no angulation or rotation. The ankle mortise is well-maintained. There is significant callus formation or fracture healing noted on today's x-rays."    PATIENT SURVEYS:  LEFS 44/80   FOTO: 47 (taken 11/06/2021)  COGNITION:  Overall cognitive status: Within functional limits for tasks assessed     SENSATION: WFL BLE, denies any n/t in BLE  EDEMA:  None observed currently, but does report swelling later in the day in her B ankles "just with the socks"  MUSCLE LENGTH: Hamstrings: Right -18 deg; Left -19 deg deg from neutral   POSTURE: rounded shoulders and increased thoracic kyphosis  PALPATION: Medial lower leg just superior to medial malleolus feels "bruised"  LOWER EXTREMITY ROM:  Active ROM Right eval Left eval  Ankle dorsiflexion 1 5  Ankle plantarflexion ~50 ~50   (Blank rows = not tested)  LOWER EXTREMITY MMT:   BLE grossly 4+/5 (*did not test R ankle musculature today)   FUNCTIONAL TESTS:  10 meter walk test: 0.83 m/s with QC  Berg: 51/56 taken 11/06/2021  TODAY'S TREATMENT:  TherEx:  Treadmill endurance training @ 2% elevation, amb up to 2.0 mph x 7 min (1 min cool down). Pt states intervention was fine.  Followed by amb down clinic hallway to outside path and back to promote cardioresp and LE muscle endurance.   Pt does take rest break after intervention but reports she feels ok, is not fatigued. Does exhibit mild unsteadiness with turns.  Seated LAQ with 5# AW each LE 15x each. Able to  perform more reps before onset of R hip ache compared to previous session  March with 5# weights 15x LLE and 5x RLE, limited by pain of R hip   Seated adductor pball squeezes 2x15 with 3 sec holds. Rates easy   NMR:  Obstacle course - pt ambulating near objects and around cones to create narrow path to promote awareness of positioning of L side/LUE as pt still bumps into LUE at home x 8 rounds. VC/TC provided throughout   Firm surface, standing with EC and NBOS 2x30 sec. Slight decrease in postural stability   Standing on ariex, NBOS EC 2x30 sec - requires up to mod assist to correct for LOB   PATIENT EDUCATION:  Education details: Pt educated throughout session about proper posture and technique with exercises. Improved exercise technique, movement at target joints, use of target muscles after min  to mod verbal, visual, tactile cues.  Person educated: Patient Education method: Explanation, Demonstration, and Verbal cues Education comprehension: verbalized understanding, returned demonstration, and needs further education    HOME EXERCISE PROGRAM:   10/5: Access Code: [email or web address] URL: [email or web address] Date: 12/27/2021 Prepared by: Ricard Dillon  Exercises - Standing Single Leg Stance with Counter Support  - 1 x daily - 7 x weekly - 2 sets - 2 reps - 30 hold   8/15: Access Code: [email or web address] URL: [email or web address] Date: 11/06/2021 Prepared by: Ricard Dillon  Exercises - Supine Single Leg Ankle Pumps  - 1 x daily - 7 x weekly - 2 sets - 20 reps - Supine Active Straight Leg Raise  - 1 x daily - 5 x weekly - 2 sets - 15 reps  ASSESSMENT:  CLINICAL IMPRESSION:  Pt able to increase gait speed on treadmill while at 2% incline then immediately follow intervention with ambulating on outside trail, indicating further improvement in endurance. Pt most challenged with obstacle course meant to promote awareness of L side positioning, particularly L UE. Pt  also mildly unsteady with sharp turns. The patient will benefit from further skilled PT to improve strength, balance and mobility to further improve deficits and make progress toward goals.  OBJECTIVE IMPAIRMENTS Abnormal gait, decreased activity tolerance, decreased balance, decreased coordination, decreased endurance, decreased mobility, difficulty walking, decreased ROM, decreased strength, hypomobility, increased edema, impaired flexibility, impaired UE functional use, improper body mechanics, postural dysfunction, and pain.   ACTIVITY LIMITATIONS carrying, lifting, standing, stairs, bed mobility, bathing, dressing, reach over head, hygiene/grooming, and locomotion level  PARTICIPATION LIMITATIONS: meal prep, cleaning, laundry, driving, shopping, community activity, and yard work  PERSONAL FACTORS Age, Past/current experiences, Sex, and 3+ comorbidities: hypothyroidism, and Raynaud's disease, history of cervical fusion in 2010 C4-C7, R side MCA CVA 02/26/21  are also affecting patient's functional outcome.   REHAB POTENTIAL: Good  CLINICAL DECISION MAKING: Stable/uncomplicated  EVALUATION COMPLEXITY: Moderate   GOALS: Goals reviewed with patient? Yes  SHORT TERM GOALS: Target date: 12/27/2021  Patient will be independent in home exercise program to improve strength/mobility for better functional independence with ADLs. Baseline: to be initiated next 1-2 sessions; 8/15: initiated; 9/14: Pt reports she indep Goal status: MET LONG TERM GOALS: Target date: 01/24/2022   Patient will increase FOTO score to equal to or greater than  63  to demonstrate statistically significant improvement in mobility and quality of life.  Baseline: 8/15: 47; 9/14: 66% Goal status: MET  2.   Patient will increase 10 meter walk test to >1.54ms as to improve gait speed for better community ambulation and to reduce fall risk. Baseline: 0.83 m/s with QC; 9/14: 0.92 m/s with QC, without AD 0.91 m/s Goal status:  Partially MET  3.  Patient will increase BLE gross strength by 1/2 point on MMT as to improve functional strength for independent gait, increased standing tolerance and increased ADL ability. Baseline: grossly 4+/5 BLE, with exception of R ankle testing deferred at eval; 9/15: grossly 4+/5 B including R ankle Goal status: IN PROGRESS  4.   Patient will demonstrate an improved Berg Balance Score of at least 3 points or greater as to demonstrate improved balance with ADLs such as sitting/standing and transfer balance and reduced fall risk.  Baseline: 8/15: 51; 9/14: 54/56 Goal status: MET  5.  Patient will increase lower extremity functional scale to >60/80 to demonstrate improved functional mobility and increased tolerance with ADLs.  Baseline: 44/80; 9/14: 50/80 Goal status: INITIAL   6.  Patient will increase six minute walk test distance to >1300 ft for improve gait ability and return to PLOF to complete her walks in her neighborhood.  Baseline: 1194 ft without AD, with SBA, pt reported feeling fatigued in hips around minutes 4 Goal status: INITIAL    PLAN: PT FREQUENCY: 2x/week  PT DURATION: 12 weeks  PLANNED INTERVENTIONS: Therapeutic exercises, Therapeutic activity, Neuromuscular re-education, Balance training, Gait training, Patient/Family education, Self Care, Joint mobilization, Joint manipulation, Stair training, Vestibular training, Canalith repositioning, Visual/preceptual remediation/compensation, Orthotic/Fit training, DME instructions, Electrical stimulation, Spinal mobilization, Cryotherapy, Moist heat, Splintting, Taping, Ultrasound, Manual therapy, and Re-evaluation  PLAN FOR NEXT SESSION:  balance and strength training, gentle mobility R ankle, isometrics, gait training, endurance, continue plan   Zollie Pee, PT 01/10/2022, 3:24 PM

## 2022-01-13 NOTE — Therapy (Addendum)
OUTPATIENT OCCUPATIONAL THERAPY NEURO TREATMENT NOTE Patient Name: Teresa [name] MRN: [id number] DOB:[birth year], 81 y.o., female Today's Date: 12/13/2021  PCP: Dr. [name] REFERRING PROVIDER: Dr. [name]     OT End of Session - 01/13/22 1436     Visit Number 64    Number of Visits 72    Date for OT Re-Evaluation 01/31/22    Authorization Time Period Reporting period beginning 12/25/21    OT Start Time 1100    OT Stop Time 1145    OT Time Calculation (min) 45 min    Activity Tolerance Patient tolerated treatment well    Behavior During Therapy [facility] for tasks assessed/performed             Past Medical History:  Diagnosis Date   Cancer ([place])    skin   Hypothyroidism    Raynaud's disease    Past Surgical History:  Procedure Laterality Date   [place]   BACK SURGERY  2010   Cervical fusion C4-5-6-7   CATARACT EXTRACTION, BILATERAL Bilateral 10/2016   CHOLECYSTECTOMY  1995   COLONOSCOPY WITH PROPOFOL N/A 12/08/2018   Procedure: COLONOSCOPY WITH PROPOFOL;  Surgeon: Toledo, Benay Pike, MD;  Location: [facility] ENDOSCOPY;  Service: Gastroenterology;  Laterality: N/A;   EYE SURGERY     Patient Active Problem List   Diagnosis Date Noted   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   Stroke ([place]) 02/27/2021   History of nonmelanoma skin cancer 05/23/2014    ONSET DATE: 02/26/2021  REFERRING DIAG: R MCA CVA  THERAPY DIAG:  Muscle weakness (generalized)  Other lack of coordination  Right middle cerebral artery stroke [facility])  Rationale for Evaluation and Treatment Rehabilitation  PERTINENT HISTORY: February 26, 2021, pt reports she had a CVA, came to [facility] to ER and then was transferred to [facility] in [place] where she was admitted and after acute care she went to inpatient rehab.  Following inpt rehab, pt went home and had home health.   PRECAUTIONS: fall  SUBJECTIVE:  Pt reports doing  well today.    PAIN:  Are you having pain? Yes: NPRS scale: 2/10 Pain location:   L upper trap, pec Pain description: tightness, sore, tender Aggravating factors: N/A Relieving factors: rest, heat, otc pain meds   OBJECTIVE:  L grip 6, R grip 41 L lateral pinch 5, R 13 L 3 point pinch 4, R 14  L 9 hole 1 min 43 sec. L shoulder active flexion 0-105, passive 0-125  TODAY'S TREATMENT:  Moist heat applied to L shoulder  Therapeutic Exercise: Performed gentle passive scapular mobility for protraction/retraction/elevation/depression.  Performed gentle passive stretching within pain free range for L shoulder all planes, forearm pron/sup, wrist and digit flex/ext; good tolerance.  Completed LUE strengthening with 1# weight to perform wrist flex/ext/RU/RD, pron/sup, elbow flex, chest press, ER with arm abducted to 80 (OT providing support for this position), and active shoulder flex and abd no resistance d/t limited range; pt completed all for 3 sets 7 reps each.  OT provided min guard for all exercises completed with 1# weight, and provided mod vc and tactile cues for form and technique.  Pt completed 3 sets 10 reps of grip squeezes using moderate resistance on hand gripper (1 green band).     PATIENT EDUCATION: Education details: LUE strengthening Person educated: pt Education method: Explanation and Verbal cues Education comprehension: verbalized understanding   HOME EXERCISE PROGRAM Continue to engage  LUE into ADLs; continue gripping and pinching exercises for LUE, and L shoulder AROM/AAROM, crochet , typing    OT Long Term Goals -       OT LONG TERM GOAL #1   Title Pt will be independent with home exercise program.    Baseline Eval: no current program, 10th visit:  continue to add new exercises as pt progresses, 20th:  continue to update HEP; 08/17/21: continue to progress HEP when indicated.  Visit 40: adding new exercises as pt progresses; 12/25/21: ongoing with progressions   Time  12    Period Weeks    Status On-going    Target Date 01/31/22      OT LONG TERM GOAL #2   Title Pt will complete UB and LB dressing with modified independence including buttons, snaps and zippers.    Baseline requires min assist at eval, 10th visit: occasional assist with buttons, 20th:  able to perform one handed, but difficulty with bilateral UE; 08/17/21: pt reports inconsistent with 1 hand, reviewed techniques this visit, Visit 40:  Pt requires assist with bra   Time 12    Period Weeks    Status MET   Target Date 11/08/21     OT LONG TERM GOAL #3   Title Pt will perform shower transfer with modified independence.    Baseline Pt requires supervision to min assist for shower transfer at home. 10th visit: supervision; 08/17/21: supv .  Visit 40:  Pt had met goal but had recent fall and now requiring min guard to supv   Time 6    Period Weeks    Status  Met    Target Date 11/08/21      OT LONG TERM GOAL #4   Title Pt will improve L hand grip by 10# to assist with holding items in left hand securely.    Baseline no grip in left hand at eval, 10th visit:  improved flexion but still working towards composite fisting and grip. 20th:  continues to demo decreased grip; 08/17/21: active digit flexion improving, but not yet able to register grip on dynamometer; 09/04/21: L grip 1# 7/17:  5#; 12/25/21: L grip 6#   Time 12    Period Weeks    Status On-going    Target Date 01/31/22      OT LONG TERM GOAL #5   Title Pt will improve left shoulder flexion to 100 degrees or better to improve reaching to obtain self care items from shelf/shoulder height.    Baseline difficulty with reach, shoulder flexion to 47 degrees; 08/17/21: L shoulder flexion 85, but not yet able to consistently hold ADL supplies in L hand when reaching; 09/04/21: flexion 85 7/17:  shoulder flexion to 90; 12/25/21: 105 (P 125)    Time 12    Period Weeks    Status On-going    Target Date 01/31/22      OT LONG TERM GOAL #6   Title  Pt will improve FOTO score to 47 or above to demonstrate a clinically relevant change in function to impact ADL tasks.    Baseline score of 30 at eval; 08/17/21: FOTO: 42; 09/04/21: FOTO : 54 , FOTO 7/7:  60; 12/25/21: 59   Time 12    Period Weeks    Status  MET/ongoing    Target Date 01/31/22      OT LONG TERM GOAL #7   Title Pt will demonstrate ability to pick up small objects and complete 9 hole peg test  in less than 1 min 30 sec (revised from under 2 min)   Baseline unable to perform at eval, 10th visit: still unable to pick up small pegs, 20th: unable to pick up pegs but improving; 08/17/21: not attempted d/t time constraints, will assess next visit; 09/04/21: 9 hole peg test in 5 min 53 sec;  7/17:  Pt unable to complete this date but was able to place 6 of 9 pegs in 4 mins 20 secs; 12/25/21: L 1 min 43 sec   Time 12    Period Weeks    Status Revised/On-going    Target Date 01/31/2022             Plan -     Clinical Impression Statement Pt tolerated a 1 # hand held weight for wrist all planes, pron/sup, elbow flex, chest press, ER with arm abducted to 80 (OT providing support for this position), and active shoulder flex and abd no resistance d/t limited range; pt completed all for 3 sets 7 reps each.  OT provided min guard for all exercises completed with 1# weight, and provided mod vc and tactile cues for form and technique.  Pt with good tolerance to all exercises.  Encouraged pt focus on [facility] at home over the weekend with her knitting since strengthening was the focus over last 2 sessions.  Pt agreed.  Pt will continue to work towards goals in plan of care to improve left UE function, decrease pain and increase active normal movement patterns for daily tasks.    OT Occupational Profile and History Detailed Assessment- Review of Records and additional review of physical, cognitive, psychosocial history related to current functional performance    Occupational performance deficits (Please  refer to evaluation for details): ADL's;IADL's;Leisure;Rest and Sleep    Body Structure / Function / Physical Skills ADL;Coordination;Endurance;GMC;UE functional use;Balance;IADL;Pain;Dexterity;FMC;Strength;Edema;Mobility;ROM    Psychosocial Skills Environmental  Adaptations;Habits;Routines and Behaviors    Rehab Potential Good    Clinical Decision Making Several treatment options, min-mod task modification necessary   Comorbidities Affecting Occupational Performance: May have comorbidities impacting occupational performance    Modification or Assistance to Complete Evaluation  Min-Moderate modification of tasks or assist with assess necessary to complete eval    OT Frequency 2x / week    OT Duration 12 weeks    OT Treatment/Interventions Self-care/ADL training;Cryotherapy;Paraffin;Therapeutic exercise;DME and/or AE instruction;Functional Mobility Training;Balance training;Electrical Stimulation;Ultrasound;Neuromuscular education;Manual Therapy;Splinting;Moist Heat;Contrast Bath;Passive range of motion;Therapeutic activities;Patient/family education;Coping strategies training    Plan OT recert    Consulted and Agree with Plan of Care Patient            Leta Speller, MS, OTR/L  Darleene Cleaver, OT 01/13/2022, 2:37 PM

## 2022-01-15 ENCOUNTER — Ambulatory Visit: Payer: Medicare PPO

## 2022-01-15 DIAGNOSIS — R262 Difficulty in walking, not elsewhere classified: Secondary | ICD-10-CM

## 2022-01-15 DIAGNOSIS — R2681 Unsteadiness on feet: Secondary | ICD-10-CM

## 2022-01-15 DIAGNOSIS — M6281 Muscle weakness (generalized): Secondary | ICD-10-CM

## 2022-01-15 DIAGNOSIS — I63511 Cerebral infarction due to unspecified occlusion or stenosis of right middle cerebral artery: Secondary | ICD-10-CM

## 2022-01-15 DIAGNOSIS — R278 Other lack of coordination: Secondary | ICD-10-CM

## 2022-01-15 NOTE — Therapy (Signed)
OUTPATIENT PHYSICAL THERAPY LOWER EXTREMITY TREATMENT/Physical Therapy Progress Note   Dates of reporting period  12/06/2021   to   01/15/2022    Patient Name: Teresa [name] MRN: [id number] DOB:[birth year], 81 y.o., female Today's Date: 01/15/2022   PT End of Session - 01/15/22 1103     Visit Number 20    Number of Visits 25    Date for PT Re-Evaluation 01/24/22    Progress Note Due on Visit 30    PT Start Time 1145    PT Stop Time 1227    PT Time Calculation (min) 42 min    Equipment Utilized During Treatment Gait belt    Activity Tolerance Patient tolerated treatment well    Behavior During Therapy WFL for tasks assessed/performed                                Past Medical History:  Diagnosis Date   Cancer ([place])    skin   Hypothyroidism    Raynaud's disease    Past Surgical History:  Procedure Laterality Date   [place]   BACK SURGERY  2010   Cervical fusion C4-5-6-7   CATARACT EXTRACTION, BILATERAL Bilateral 10/2016   CHOLECYSTECTOMY  1995   COLONOSCOPY WITH PROPOFOL N/A 12/08/2018   Procedure: COLONOSCOPY WITH PROPOFOL;  Surgeon: Toledo, Benay Pike, MD;  Location: [facility] ENDOSCOPY;  Service: Gastroenterology;  Laterality: N/A;   EYE SURGERY     Patient Active Problem List   Diagnosis Date Noted   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   Stroke ([place]) 02/27/2021   History of nonmelanoma skin cancer 05/23/2014    PCP: Idelle Crouch, MD  REFERRING PROVIDER: Idelle Crouch, MD  REFERRING DIAG: [phone] (ICD-10-CM) - Right ankle pain  THERAPY DIAG:  Difficulty in walking, not elsewhere classified  Muscle weakness (generalized)  Unsteadiness on feet  Other lack of coordination  Right middle cerebral artery stroke [facility])  Rationale for Evaluation and Treatment Rehabilitation  ONSET DATE: September 15 2021  SUBJECTIVE:   SUBJECTIVE STATEMENT: Pt reports she has walked  up to 1.9 mi during some of her walks with friend and continues to use her quad cane. She reports 2/10 R shoulder pain today.  PAIN:  Are you having pain? Right shoulder pain NPRS scale: 2/10 Pain location: B shoulders Pain description:  Aggravating factors:  Relieving factors:   PERTINENT HISTORY: On June 24th, 2023 pt fell pulling up her pajama pants at home, slipped, and fractured her R ankle (closed traumatic nondisplaced fracture of posterior malleolus of R tibia). Pt had been working with PT for R side MCA CVA (02/26/21). Pt now returns to PT. She is WBAT per chart (Dr. Doy Hutching). Pt uses a quad cane for ambulation. She is not wearing an ankle brace, but does report she wears a brace when out for longer periods of time (does not currently have it with her for exam). She reports she has difficulty with balance with turning and feels she is dragging her LLE. Pt reports she has no ankle pain. Feels her balance is generally off. She reports she has been moving her R ankle and walking on it for exercise. Pt is not aware of any other restrictions other than to wear her brace. Pt reports she was discharged from working with her physician for her ankle due to good progress.  Pt has not had any  other falls since the one in June, just stumbling with turns. She continues to work with OT for her UE s/p CVA. PMH includes: skin cancer, hypothyroidism, and Raynaud's disease, history of cervical fusion in 2010 C4-C7, R side MCA CVA 02/26/21.  PRECAUTIONS: Fall  WEIGHT BEARING RESTRICTIONS Yes WBAT, pt has R ankle brace but reports only wears for long-distance walking  FALLS:  Has patient fallen in last 6 months? Yes. Number of falls 1  LIVING ENVIRONMENT: Lives with: lives with their spouse Lives in: House/apartment Stairs: Yes: External: 1 steps; none Does have second floor, with handrail going up on L, but hasn't gone upstairs per report Has following equipment at home: Quad cane small base, Environmental consultant - 2  wheeled, Wheelchair (manual), and Grab bars   PLOF: Independent with household mobility with device and reports husband has helped with bathing since stroke  PATIENT GOALS  Improve her balance with turning and not dragging her feet, "the left leg is dragging again"   OBJECTIVE:  Taken at eval unless indicated otherwise DIAGNOSTIC FINDINGS:  Information via chart - Scarlett Presto, PA 10/23/2021: "Xrays of the right ankle were ordered and interpreted 10/23/2021, with 3 views using AP, lateral, and mortise views. Xrays revealed the right ankle with a posterior malleolus nondisplaced fracture with no angulation or rotation. The ankle mortise is well-maintained. There is significant callus formation or fracture healing noted on today's x-rays."    PATIENT SURVEYS:  LEFS 44/80   FOTO: 47 (taken 11/06/2021)  COGNITION:  Overall cognitive status: Within functional limits for tasks assessed     SENSATION: WFL BLE, denies any n/t in BLE  EDEMA:  None observed currently, but does report swelling later in the day in her B ankles "just with the socks"  MUSCLE LENGTH: Hamstrings: Right -18 deg; Left -19 deg deg from neutral   POSTURE: rounded shoulders and increased thoracic kyphosis  PALPATION: Medial lower leg just superior to medial malleolus feels "bruised"  LOWER EXTREMITY ROM:  Active ROM Right eval Left eval  Ankle dorsiflexion 1 5  Ankle plantarflexion ~50 ~50   (Blank rows = not tested)  LOWER EXTREMITY MMT:   BLE grossly 4+/5 (*did not test R ankle musculature today)   FUNCTIONAL TESTS:  10 meter walk test: 0.83 m/s with QC  Berg: 51/56 taken 11/06/2021  TODAY'S TREATMENT:  Reassessed goals for progress note visit #20 - See goal section for specific details.        PATIENT EDUCATION:  Education details: Pt educated throughout session about proper posture and technique with exercises. Improved exercise technique, movement at target joints, use of target  muscles after min to mod verbal, visual, tactile cues.  Person educated: Patient Education method: Explanation, Demonstration, and Verbal cues Education comprehension: verbalized understanding, returned demonstration, and needs further education    HOME EXERCISE PROGRAM:   10/5: Access Code: [email or web address] URL: [email or web address] Date: 12/27/2021 Prepared by: Ricard Dillon  Exercises - Standing Single Leg Stance with Counter Support  - 1 x daily - 7 x weekly - 2 sets - 2 reps - 30 hold   8/15: Access Code: [email or web address] URL: [email or web address] Date: 11/06/2021 Prepared by: Ricard Dillon  Exercises - Supine Single Leg Ankle Pumps  - 1 x daily - 7 x weekly - 2 sets - 20 reps - Supine Active Straight Leg Raise  - 1 x daily - 5 x weekly - 2 sets - 15 reps  ASSESSMENT:  CLINICAL IMPRESSION:  Patient initially presented with gait using her  quad cane today- performed a 6 min walk test and tested at 1140 feet yet rocking with quad cane so transitioned to tripod/hurrycane with much improved gait sequencing- 1275 feet in 6 min and patient reported that she preferred this cane as opposed to [facility] or her quad cane. She was able to demo improved overall gait speed using this cane as well.  Patient's condition has the potential to improve in response to therapy. Maximum improvement is yet to be obtained. The anticipated improvement is attainable and reasonable in a generally predictable time.  The patient will benefit from further skilled PT to improve strength, balance and mobility to further improve deficits and make progress toward goals.  OBJECTIVE IMPAIRMENTS Abnormal gait, decreased activity tolerance, decreased balance, decreased coordination, decreased endurance, decreased mobility, difficulty walking, decreased ROM, decreased strength, hypomobility, increased edema, impaired flexibility, impaired UE functional use, improper body mechanics, postural dysfunction, and  pain.   ACTIVITY LIMITATIONS carrying, lifting, standing, stairs, bed mobility, bathing, dressing, reach over head, hygiene/grooming, and locomotion level  PARTICIPATION LIMITATIONS: meal prep, cleaning, laundry, driving, shopping, community activity, and yard work  PERSONAL FACTORS Age, Past/current experiences, Sex, and 3+ comorbidities: hypothyroidism, and Raynaud's disease, history of cervical fusion in 2010 C4-C7, R side MCA CVA 02/26/21  are also affecting patient's functional outcome.   REHAB POTENTIAL: Good  CLINICAL DECISION MAKING: Stable/uncomplicated  EVALUATION COMPLEXITY: Moderate   GOALS: Goals reviewed with patient? Yes  SHORT TERM GOALS: Target date: 12/27/2021  Patient will be independent in home exercise program to improve strength/mobility for better functional independence with ADLs. Baseline: to be initiated next 1-2 sessions; 8/15: initiated; 9/14: Pt reports she indep Goal status: MET LONG TERM GOALS: Target date: 01/24/2022   Patient will increase FOTO score to equal to or greater than  63  to demonstrate statistically significant improvement in mobility and quality of life.  Baseline: 8/15: 47; 9/14: 66% Goal status: MET  2.   Patient will increase 10 meter walk test to >1.65m/s as to improve gait speed for better community ambulation and to reduce fall risk. Baseline: 0.83 m/s with QC; 9/14: 0.92 m/s with QC, without AD 0.91 m/s; 01/15/2022= 0.95 m/s with hurrycane (3point) Goal status: Partially MET  3.  Patient will increase BLE gross strength by 1/2 point on MMT as to improve functional strength for independent gait, increased standing tolerance and increased ADL ability. Baseline: grossly 4+/5 BLE, with exception of R ankle testing deferred at eval; 9/15: grossly 4+/5 B including R ankle; 01/15/2022= 4+/5 right ankle DF/EV Goal status: IN PROGRESS  4.   Patient will demonstrate an improved Berg Balance Score of at least 3 points or greater as to  demonstrate improved balance with ADLs such as sitting/standing and transfer balance and reduced fall risk.  Baseline: 8/15: 51; 9/14: 54/56 Goal status: MET  5.  Patient will increase lower extremity functional scale to >60/80 to demonstrate improved functional mobility and increased tolerance with ADLs.  Baseline: 44/80; 9/14: 50/80; 01/15/2022= 50/80 Goal status: PROGRESSING   6.  Patient will increase six minute walk test distance to >1300 ft for improve gait ability and return to PLOF to complete her walks in her neighborhood.  Baseline: 1194 ft without AD, with SBA, pt reported feeling fatigued in hips around minutes 4; 01/15/2022= 1275 feet with Hurrycane Goal status: PROGRESSING    PLAN: PT FREQUENCY: 2x/week  PT DURATION: 12 weeks  PLANNED INTERVENTIONS: Therapeutic exercises, Therapeutic activity, Neuromuscular re-education, Balance training, Gait training, Patient/Family education, Self Care,  Joint mobilization, Joint manipulation, Stair training, Vestibular training, Canalith repositioning, Visual/preceptual remediation/compensation, Orthotic/Fit training, DME instructions, Electrical stimulation, Spinal mobilization, Cryotherapy, Moist heat, Splintting, Taping, Ultrasound, Manual therapy, and Re-evaluation  PLAN FOR NEXT SESSION:  balance and strength training, gentle mobility R ankle, isometrics, gait training, endurance, continue plan   Lewis Moccasin, PT 01/15/2022, 1:54 PM

## 2022-01-15 NOTE — Therapy (Signed)
OUTPATIENT OCCUPATIONAL THERAPY NEURO TREATMENT NOTE Patient Name: Teresa [name] MRN: [id number] DOB:01-08-1941, 81 y.o., female Today's Date: 12/13/2021  PCP: Dr. [name] REFERRING PROVIDER: Dr. [name]     OT End of Session - 01/15/22 1356     Visit Number 65    Number of Visits 67    Date for OT Re-Evaluation 01/31/22    Authorization Time Period Reporting period beginning 12/25/21    OT Start Time 1100    OT Stop Time 1145    OT Time Calculation (min) 45 min    Activity Tolerance Patient tolerated treatment well    Behavior During Therapy [facility] for tasks assessed/performed             Past Medical History:  Diagnosis Date   Cancer ([place])    skin   Hypothyroidism    Raynaud's disease    Past Surgical History:  Procedure Laterality Date   [place]   BACK SURGERY  2010   Cervical fusion C4-5-6-7   CATARACT EXTRACTION, BILATERAL Bilateral 10/2016   CHOLECYSTECTOMY  1995   COLONOSCOPY WITH PROPOFOL N/A 12/08/2018   Procedure: COLONOSCOPY WITH PROPOFOL;  Surgeon: Toledo, Benay Pike, MD;  Location: [facility] ENDOSCOPY;  Service: Gastroenterology;  Laterality: N/A;   EYE SURGERY     Patient Active Problem List   Diagnosis Date Noted   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)  Rationale for Evaluation and Treatment Rehabilitation  PERTINENT HISTORY: February 26, 2021, pt reports she had a CVA, came to [facility] to ER and then was transferred to [facility] in [place] where she was admitted and after acute care she went to inpatient rehab.  Following inpt rehab, pt went home and had home health.   PRECAUTIONS: fall  SUBJECTIVE:  Pt reports she  didn't crochet over the weekend as her son was visiting from [place].   PAIN:  Are you having pain? Yes: NPRS scale: 2/10 Pain location:   R upper arm from RSV shot yesterday, L upper trap, pec Pain description: tightness, sore, tender Aggravating factors: N/A Relieving factors: rest, heat, otc pain meds   OBJECTIVE:  L grip 6, R grip 41 L lateral pinch 5, R 13 L 3 point pinch 4, R 14  L 9 hole 1 min 43 sec. L shoulder active flexion 0-105, passive 0-125  TODAY'S TREATMENT:  Moist heat applied to L shoulder  Therapeutic Exercise: Performed gentle passive scapular mobility for protraction/retraction/elevation/depression.  Performed gentle passive stretching within pain free range for L shoulder all planes, forearm pron/sup; good tolerance.  Completed LUE strengthening with 1# weight to perform wrist flex/ext/RU/RD, pron/sup, elbow flex, chest press, ER with arm abducted to 80 (OT providing support for this position), and active shoulder flex and abd no resistance d/t limited range (min A at end of range to reach max range); pt completed all for 2 sets 10 reps each.  OT provided min guard for all exercises completed with 1# weight, and provided min vc and tactile cues for form and technique.  Pt completed 3 sets 10 reps of grip squeezes using moderate resistance on hand gripper (1 green band), cues to maximize finger closure with each squeeze.  Neuro re-ed: Facilitated L hand FMC/GMC skills working to pick up washers from magnetic dish and place washers over a vertical dowel on table top.  Pt experienced L shoulder fatigued after placement of ~7 washers on the vertical dowel, requiring reach at shoulder level, so transitioned to holding dowel in R hand and moving washers onto horizontal dowel with L hand.    PATIENT EDUCATION: Education details: LUE strengthening Person educated: pt Education method: Explanation and Verbal cues Education comprehension: verbalized understanding   HOME  EXERCISE PROGRAM Continue to engage LUE into ADLs; continue gripping and pinching exercises for LUE, and L shoulder AROM/AAROM, crochet , typing    OT Long Term Goals -       OT LONG TERM GOAL #1   Title Pt will be independent with home exercise program.    Baseline Eval: no current program, 10th visit:  continue to add new exercises as pt progresses, 20th:  continue to update HEP; 08/17/21: continue to progress HEP when indicated.  Visit 40: adding new exercises as pt progresses; 12/25/21: ongoing with progressions   Time 12    Period Weeks    Status On-going    Target Date 01/31/22      OT LONG TERM GOAL #2   Title Pt will complete UB and LB dressing with modified independence including buttons, snaps and zippers.    Baseline requires min assist at eval, 10th visit: occasional assist with buttons, 20th:  able to perform one handed, but difficulty with bilateral UE; 08/17/21: pt reports inconsistent with 1 hand, reviewed techniques this visit, Visit 40:  Pt requires assist with bra   Time 12    Period Weeks    Status MET   Target Date 11/08/21     OT LONG TERM GOAL #3   Title Pt will perform shower transfer with modified independence.    Baseline Pt requires supervision to min assist for shower transfer at home. 10th visit: supervision; 08/17/21: supv .  Visit 40:  Pt had met goal but had recent fall and now requiring min guard to supv   Time 6    Period Weeks    Status  Met    Target Date 11/08/21      OT LONG TERM GOAL #4   Title Pt will improve L hand grip by 10# to assist with holding items in left hand securely.    Baseline no grip in left hand at eval, 10th visit:  improved flexion but still working towards composite fisting and grip. 20th:  continues to demo decreased grip; 08/17/21: active digit flexion improving, but not yet able to register grip on dynamometer; 09/04/21: L grip 1# 7/17:  5#; 12/25/21: L grip 6#   Time 12    Period Weeks    Status On-going    Target Date  01/31/22      OT LONG TERM GOAL #5   Title Pt will improve left shoulder flexion to 100 degrees or better to improve reaching to obtain self care items from shelf/shoulder height.    Baseline difficulty with reach, shoulder flexion to 47 degrees; 08/17/21: L shoulder flexion 85, but not yet able to consistently hold ADL supplies in L hand when reaching; 09/04/21: flexion 85 7/17:  shoulder flexion to 90; 12/25/21: 105 (P 125)    Time 12    Period Weeks    Status On-going    Target Date 01/31/22      OT LONG TERM GOAL #6   Title Pt  will improve FOTO score to 47 or above to demonstrate a clinically relevant change in function to impact ADL tasks.    Baseline score of 30 at eval; 08/17/21: FOTO: 42; 09/04/21: FOTO : 54 , FOTO 7/7:  60; 12/25/21: 59   Time 12    Period Weeks    Status  MET/ongoing    Target Date 01/31/22      OT LONG TERM GOAL #7   Title Pt will demonstrate ability to pick up small objects and complete 9 hole peg test in less than 1 min 30 sec (revised from under 2 min)   Baseline unable to perform at eval, 10th visit: still unable to pick up small pegs, 20th: unable to pick up pegs but improving; 08/17/21: not attempted d/t time constraints, will assess next visit; 09/04/21: 9 hole peg test in 5 min 53 sec;  7/17:  Pt unable to complete this date but was able to place 6 of 9 pegs in 4 mins 20 secs; 12/25/21: L 1 min 43 sec   Time 12    Period Weeks    Status Revised/On-going    Target Date 01/31/2022             Plan -     Clinical Impression Statement Pt showing improved control of 1# hand held weight during LUE strengthening this date, and was able to increase reps from 7 to 10 for 3 sets of each exercise. Pt was able to use L hand to pick up washers from magnetic dish with multiple trials and several dropped washers, but pt does fatigue quickly when reaching at shoulder level to place washers over vertical dowel.  Pt will continue to work towards goals in plan of care to  improve left UE function, decrease pain and increase active normal movement patterns for daily tasks.    OT Occupational Profile and History Detailed Assessment- Review of Records and additional review of physical, cognitive, psychosocial history related to current functional performance    Occupational performance deficits (Please refer to evaluation for details): ADL's;IADL's;Leisure;Rest and Sleep    Body Structure / Function / Physical Skills ADL;Coordination;Endurance;GMC;UE functional use;Balance;IADL;Pain;Dexterity;FMC;Strength;Edema;Mobility;ROM    Psychosocial Skills Environmental  Adaptations;Habits;Routines and Behaviors    Rehab Potential Good    Clinical Decision Making Several treatment options, min-mod task modification necessary   Comorbidities Affecting Occupational Performance: May have comorbidities impacting occupational performance    Modification or Assistance to Complete Evaluation  Min-Moderate modification of tasks or assist with assess necessary to complete eval    OT Frequency 2x / week    OT Duration 12 weeks    OT Treatment/Interventions Self-care/ADL training;Cryotherapy;Paraffin;Therapeutic exercise;DME and/or AE instruction;Functional Mobility Training;Balance training;Electrical Stimulation;Ultrasound;Neuromuscular education;Manual Therapy;Splinting;Moist Heat;Contrast Bath;Passive range of motion;Therapeutic activities;Patient/family education;Coping strategies training    Plan OT recert    Consulted and Agree with Plan of Care Patient            Leta Speller, MS, OTR/L  Darleene Cleaver, OT 01/15/2022, 1:58 PM

## 2022-01-17 ENCOUNTER — Ambulatory Visit: Payer: Medicare PPO | Admitting: Occupational Therapy

## 2022-01-17 ENCOUNTER — Ambulatory Visit: Payer: Medicare PPO

## 2022-01-17 ENCOUNTER — Encounter: Payer: Self-pay | Admitting: Occupational Therapy

## 2022-01-17 DIAGNOSIS — R278 Other lack of coordination: Secondary | ICD-10-CM

## 2022-01-17 DIAGNOSIS — M6281 Muscle weakness (generalized): Secondary | ICD-10-CM | POA: Diagnosis not present

## 2022-01-17 DIAGNOSIS — I63511 Cerebral infarction due to unspecified occlusion or stenosis of right middle cerebral artery: Secondary | ICD-10-CM

## 2022-01-17 NOTE — Therapy (Signed)
OUTPATIENT OCCUPATIONAL THERAPY NEURO TREATMENT NOTE Patient Name: Teresa [name] MRN: [id number] DOB:02-10-1941, 81 y.o., female Today's Date: 12/13/2021  PCP: Dr. [name] REFERRING PROVIDER: Dr. [name]     OT End of Session - 01/17/22 1057     Visit Number 40    Number of Visits 72    Date for OT Re-Evaluation 01/31/22    Authorization Time Period Reporting period beginning 12/25/21    OT Start Time 1100    OT Stop Time 1145    OT Time Calculation (min) 45 min    Activity Tolerance Patient tolerated treatment well    Behavior During Therapy [facility] for tasks assessed/performed             Past Medical History:  Diagnosis Date   Cancer ([place])    skin   Hypothyroidism    Raynaud's disease    Past Surgical History:  Procedure Laterality Date   [place]   BACK SURGERY  2010   Cervical fusion C4-5-6-7   CATARACT EXTRACTION, BILATERAL Bilateral 10/2016   CHOLECYSTECTOMY  1995   COLONOSCOPY WITH PROPOFOL N/A 12/08/2018   Procedure: COLONOSCOPY WITH PROPOFOL;  Surgeon: Toledo, Benay Pike, MD;  Location: [facility] ENDOSCOPY;  Service: Gastroenterology;  Laterality: N/A;   EYE SURGERY     Patient Active Problem List   Diagnosis Date Noted   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  Following inpt rehab, pt went home and had home health.   PRECAUTIONS: fall  SUBJECTIVE:  Pt reports pain  from her RSV shot this week.   PAIN:  Are you having pain? Yes: NPRS scale: 2/10 Pain location:   R upper arm from RSV shot yesterday, L upper trap, pec Pain description: tightness, sore, tender Aggravating factors: N/A Relieving factors: rest, heat, otc pain meds   OBJECTIVE:  L grip 6, R grip 41 L lateral pinch 5, R 13 L 3 point pinch 4, R 14  L 9 hole 1 min 43 sec. L shoulder active flexion 0-105, passive 0-125  TODAY'S TREATMENT:  Moist heat applied to L shoulder  Therapeutic Exercise: Performed gentle passive scapular mobility for protraction/retraction/elevation/depression.  Performed gentle passive stretching within pain free range for L shoulder all planes, forearm pron/sup; good tolerance.  Completed LUE strengthening with 1# weight to perform wrist flex/ext/RU/RD, increased tolerance to 2# weight for forearm pron/sup, elbow flex, chest press, and overhead press - 2 sets x 8 reps each.  MIN VC and tactile cues for form and technique.  Pt completed 2 sets 10 reps of grip squeezes using moderate resistance on hand gripper (1 green band).  Neuro re-ed: Pt removed scrabble tiles from tupperware, difficulty picking up one at a time, requires use of wall of container to grasp tiles. Holds ~6 tiles in hand without dropping, attempts 8 however tiles fall from ulnar side of palm. Pt focuses on manipulating tiles to spell words with emphasis on  use of tip to tip pinch to rotate and flip tiles. Completes 20 tile scrabble board with increased difficulty preventing 5th digit from grazing tiles. Pt reports limited by numbness/coldness in L fingertips.   PATIENT EDUCATION: Education details: LUE strengthening Person educated: pt Education method: Explanation and Verbal cues Education comprehension: verbalized understanding   HOME EXERCISE PROGRAM Continue to engage LUE into ADLs; continue gripping and pinching exercises for LUE, and L shoulder AROM/AAROM, crochet , typing    OT Long Term  Goals -       OT LONG TERM GOAL #1   Title Pt will be independent with home exercise program.    Baseline Eval: no current program, 10th visit:  continue to add new exercises as pt progresses, 20th:  continue to update HEP; 08/17/21: continue to progress HEP when indicated.  Visit 40: adding new exercises as pt progresses; 12/25/21: ongoing with progressions   Time 12    Period Weeks    Status On-going    Target Date 01/31/22      OT LONG TERM GOAL #2   Title Pt will complete UB and LB dressing with modified independence including buttons, snaps and zippers.    Baseline requires min assist at eval, 10th visit: occasional assist with buttons, 20th:  able to perform one handed, but difficulty with bilateral UE; 08/17/21: pt reports inconsistent with 1 hand, reviewed techniques this visit, Visit 40:  Pt requires assist with bra   Time 12    Period Weeks    Status MET   Target Date 11/08/21     OT LONG TERM GOAL #3   Title Pt will perform shower transfer with modified independence.    Baseline Pt requires supervision to min assist for shower transfer at home. 10th visit: supervision; 08/17/21: supv .  Visit 40:  Pt had met goal but had recent fall and now requiring min guard to supv   Time 6    Period Weeks    Status  Met    Target Date 11/08/21      OT LONG TERM GOAL #4   Title Pt will improve L hand grip by 10# to assist with holding items in left hand securely.    Baseline no grip in left hand at eval, 10th visit:  improved flexion but still working towards composite fisting and grip. 20th:  continues to demo decreased grip; 08/17/21: active digit flexion improving, but not yet able to register grip on dynamometer; 09/04/21: L grip 1# 7/17:  5#; 12/25/21: L grip 6#   Time 12    Period Weeks    Status On-going    Target Date 01/31/22      OT LONG TERM GOAL #5   Title Pt will improve left shoulder flexion to 100 degrees or better to improve reaching to obtain self care items from  shelf/shoulder height.    Baseline difficulty with reach, shoulder flexion to 47 degrees; 08/17/21: L shoulder flexion 85, but not yet able to consistently hold ADL supplies in L hand when reaching; 09/04/21: flexion 85 7/17:  shoulder flexion to 90; 12/25/21: 105 (P 125)    Time 12    Period Weeks    Status On-going    Target Date 01/31/22      OT LONG TERM GOAL #6   Title Pt will improve FOTO score to 47 or above to demonstrate a clinically relevant change in function to impact ADL tasks.    Baseline score of 30 at eval; 08/17/21:  FOTO: 42; 09/04/21: FOTO : 54 , FOTO 7/7:  60; 12/25/21: 59   Time 12    Period Weeks    Status  MET/ongoing    Target Date 01/31/22      OT LONG TERM GOAL #7   Title Pt will demonstrate ability to pick up small objects and complete 9 hole peg test in less than 1 min 30 sec (revised from under 2 min)   Baseline unable to perform at eval, 10th visit: still unable to pick up small pegs, 20th: unable to pick up pegs but improving; 08/17/21: not attempted d/t time constraints, will assess next visit; 09/04/21: 9 hole peg test in 5 min 53 sec;  7/17:  Pt unable to complete this date but was able to place 6 of 9 pegs in 4 mins 20 secs; 12/25/21: L 1 min 43 sec   Time 12    Period Weeks    Status Revised/On-going    Target Date 01/31/2022             Plan -     Clinical Impression Statement Pt improved weight to 2# for elbow, shoulder, and forearm exercises for decreased reps. Utilized L grasp to manipulate scrabble tiles, cues for tip to tip pinch and incorporating wrist into tile rotation on table. Pt will continue to work towards goals in plan of care to improve left UE function, decrease pain and increase active normal movement patterns for daily tasks.    OT Occupational Profile and History Detailed Assessment- Review of Records and additional review of physical, cognitive, psychosocial history related to current functional performance    Occupational performance  deficits (Please refer to evaluation for details): ADL's;IADL's;Leisure;Rest and Sleep    Body Structure / Function / Physical Skills ADL;Coordination;Endurance;GMC;UE functional use;Balance;IADL;Pain;Dexterity;FMC;Strength;Edema;Mobility;ROM    Psychosocial Skills Environmental  Adaptations;Habits;Routines and Behaviors    Rehab Potential Good    Clinical Decision Making Several treatment options, min-mod task modification necessary   Comorbidities Affecting Occupational Performance: May have comorbidities impacting occupational performance    Modification or Assistance to Complete Evaluation  Min-Moderate modification of tasks or assist with assess necessary to complete eval    OT Frequency 2x / week    OT Duration 12 weeks    OT Treatment/Interventions Self-care/ADL training;Cryotherapy;Paraffin;Therapeutic exercise;DME and/or AE instruction;Functional Mobility Training;Balance training;Electrical Stimulation;Ultrasound;Neuromuscular education;Manual Therapy;Splinting;Moist Heat;Contrast Bath;Passive range of motion;Therapeutic activities;Patient/family education;Coping strategies training    Plan OT recert    Consulted and Agree with Plan of Care Patient           Dessie Coma, M.S. OTR/L  01/17/22, 10:58 AM  ascom [phone]   Leonides Cave, OT 01/17/2022, 10:58 AM

## 2022-01-17 NOTE — Therapy (Signed)
OUTPATIENT PHYSICAL THERAPY LOWER EXTREMITY TREATMENT/DISCHARGE NOTE    Patient Name: Teresa [name] MRN: [id number] DOB:16-Jul-1940, 81 y.o., female Today's Date: 01/17/2022   PT End of Session - 01/17/22 1633     Visit Number 21    Number of Visits 25    Date for PT Re-Evaluation 01/24/22    Progress Note Due on Visit 30    PT Start Time 1149    PT Stop Time 1222    PT Time Calculation (min) 33 min    Equipment Utilized During Treatment Gait belt    Activity Tolerance Patient tolerated treatment well;No increased pain    Behavior During Therapy WFL for tasks assessed/performed                                 Past Medical History:  Diagnosis Date   Cancer ([place])    skin   Hypothyroidism    Raynaud's disease    Past Surgical History:  Procedure Laterality Date   ABDOMINAL HYSTERECTOMY  1991   APPENDECTOMY  1991   BACK SURGERY  2010   Cervical fusion C4-5-6-7   CATARACT EXTRACTION, BILATERAL Bilateral 10/2016   CHOLECYSTECTOMY  1995   COLONOSCOPY WITH PROPOFOL N/A 12/08/2018   Procedure: COLONOSCOPY WITH PROPOFOL;  Surgeon: Toledo, Benay Pike, MD;  Location: [facility] ENDOSCOPY;  Service: Gastroenterology;  Laterality: N/A;   EYE SURGERY     Patient Active Problem List   Diagnosis Date Noted   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 Sh reports no pain, but does have some soreness in BUE (not new). Pt still struggling with  urinary urgency, but has not had a recent accident.  PAIN:  Are you having pain? Right shoulder pain NPRS scale: not rated/10 Pain location: B shoulders Pain description:  Aggravating factors:  Relieving factors:   PERTINENT HISTORY: On June 24th, 2023 pt fell pulling up her pajama pants at home, slipped, and fractured her R ankle (closed traumatic nondisplaced fracture of posterior malleolus of R tibia). Pt had been working with PT for R side MCA CVA (02/26/21). Pt now returns to PT. She is WBAT per chart (Dr. Doy Hutching). Pt uses a quad cane for ambulation. She is not wearing an ankle brace, but does report she wears a brace when out for longer periods of time (does not currently have it with her for exam). She reports she has difficulty with balance with turning and feels she is dragging her LLE. Pt reports she has no ankle pain. Feels her balance is generally off. She reports she has been moving her R ankle and walking on it for exercise. Pt is not aware of any other restrictions other than to wear her brace. Pt reports she was discharged from working with her physician for her ankle due to good progress.  Pt has not had any other falls since the one in June, just stumbling with turns. She continues  to work with OT for her UE s/p CVA. PMH includes: skin cancer, hypothyroidism, and Raynaud's disease, history of cervical fusion in 2010 C4-C7, R side MCA CVA 02/26/21.  PRECAUTIONS: Fall  WEIGHT BEARING RESTRICTIONS Yes WBAT, pt has R ankle brace but reports only wears for long-distance walking  FALLS:  Has patient fallen in last 6 months? Yes. Number of falls 1  LIVING ENVIRONMENT: Lives with: lives with their spouse Lives in: House/apartment Stairs: Yes: External: 1 steps; none Does have second floor, with handrail going up on L, but hasn't gone upstairs per report Has following equipment at home: Quad cane small base, Environmental consultant - 2 wheeled, Wheelchair (manual), and Grab bars   PLOF: Independent  with household mobility with device and reports husband has helped with bathing since stroke  PATIENT GOALS  Improve her balance with turning and not dragging her feet, "the left leg is dragging again"   OBJECTIVE:  Taken at eval unless indicated otherwise DIAGNOSTIC FINDINGS:  Information via chart - Scarlett Presto, PA 10/23/2021: "Xrays of the right ankle were ordered and interpreted 10/23/2021, with 3 views using AP, lateral, and mortise views. Xrays revealed the right ankle with a posterior malleolus nondisplaced fracture with no angulation or rotation. The ankle mortise is well-maintained. There is significant callus formation or fracture healing noted on today's x-rays."    PATIENT SURVEYS:  LEFS 44/80   FOTO: 47 (taken 11/06/2021)  COGNITION:  Overall cognitive status: Within functional limits for tasks assessed     SENSATION: WFL BLE, denies any n/t in BLE  EDEMA:  None observed currently, but does report swelling later in the day in her B ankles "just with the socks"  MUSCLE LENGTH: Hamstrings: Right -18 deg; Left -19 deg deg from neutral   POSTURE: rounded shoulders and increased thoracic kyphosis  PALPATION: Medial lower leg just superior to medial malleolus feels "bruised"  LOWER EXTREMITY ROM:  Active ROM Right eval Left eval  Ankle dorsiflexion 1 5  Ankle plantarflexion ~50 ~50   (Blank rows = not tested)  LOWER EXTREMITY MMT:   BLE grossly 4+/5 (*did not test R ankle musculature today)   FUNCTIONAL TESTS:  10 meter walk test: 0.83 m/s with QC  Berg: 51/56 taken 11/06/2021  TODAY'S TREATMENT: TherEx-  FOTO: 71%   Standing heel raise 2x15 each LE   Standing DF 20x B, 20x individually  Above 2 interventions issued for HEP   Endurance training on treadmill, performed at 2% elevation for a total of 6 minutes (1 min cool down), up to 2.1 mph. Close CGA provided throughout with PT monitoring pt for exercise response and adjusting intensity as  appropriate.    Discussion of d/c recommendations (pt to start Pelvic PT soon)  PATIENT EDUCATION:  Education details: D/c recommendations, HEP, goals, plan Person educated: Patient Education method: Explanation, Demonstration, Verbal cues, and Handouts Education comprehension: verbalized understanding and returned demonstration    HOME EXERCISE PROGRAM:  Updated for d/c 10/26: Access Code: [email or web address] URL: [email or web address] Date: 01/17/2022 Prepared by: Ricard Dillon  Exercises - Single Leg Heel Raise with Chair Support  - 1 x daily - 4-5 x weekly - 2 sets - 15 reps - Toe Raises with Counter Support  - 1 x daily - 4-5 x weekly - 2 sets - 20 reps  10/5: Access Code: [email or web address] URL: [email or web address] Date: 12/27/2021 Prepared by: Ricard Dillon  Exercises - Standing Single Leg Stance with Counter Support  - 1 x daily - 7 x weekly -  2 sets - 2 reps - 30 hold   8/15: Access Code: [email or web address] URL: [email or web address] Date: 11/06/2021 Prepared by: Ricard Dillon  Exercises - Supine Single Leg Ankle Pumps  - 1 x daily - 7 x weekly - 2 sets - 20 reps - Supine Active Straight Leg Raise  - 1 x daily - 5 x weekly - 2 sets - 15 reps  ASSESSMENT:  CLINICAL IMPRESSION:  FOTO reassessed for discharge. Pt score increased to 71%, indicating improved perception of functional mobility and QOL as related to her ankle. Remainder of pt goals retested last session (refer to note 01/15/2022), and pt has either made gains or partially met remaining therapy goals, indicating improved gait speed and functional capacity/gait ability. She is to be discharged from this plan of care in order to start Pelvic PT as this will address her remaining deficits and primary concern of low back pain with urinary urgency. PT did update HEP so pt can continue to improve LE strength independently. The patient is to be discharged from current plan of care, but will benefit from  Pelvic PT to improve remaining deficits.  OBJECTIVE IMPAIRMENTS Abnormal gait, decreased activity tolerance, decreased balance, decreased coordination, decreased endurance, decreased mobility, difficulty walking, decreased ROM, decreased strength, hypomobility, increased edema, impaired flexibility, impaired UE functional use, improper body mechanics, postural dysfunction, and pain.   ACTIVITY LIMITATIONS carrying, lifting, standing, stairs, bed mobility, bathing, dressing, reach over head, hygiene/grooming, and locomotion level  PARTICIPATION LIMITATIONS: meal prep, cleaning, laundry, driving, shopping, community activity, and yard work  PERSONAL FACTORS Age, Past/current experiences, Sex, and 3+ comorbidities: hypothyroidism, and Raynaud's disease, history of cervical fusion in 2010 C4-C7, R side MCA CVA 02/26/21  are also affecting patient's functional outcome.   REHAB POTENTIAL: Good  CLINICAL DECISION MAKING: Stable/uncomplicated  EVALUATION COMPLEXITY: Moderate   GOALS: Goals reviewed with patient? Yes  SHORT TERM GOALS: Target date: 12/27/2021  Patient will be independent in home exercise program to improve strength/mobility for better functional independence with ADLs. Baseline: to be initiated next 1-2 sessions; 8/15: initiated; 9/14: Pt reports she indep Goal status: MET LONG TERM GOALS: Target date: 01/24/2022   Patient will increase FOTO score to equal to or greater than  63  to demonstrate statistically significant improvement in mobility and quality of life.  Baseline: 8/15: 47; 9/14: 66%;01/17/22 71% Goal status: MET  2.   Patient will increase 10 meter walk test to >1.63m/s as to improve gait speed for better community ambulation and to reduce fall risk. Baseline: 0.83 m/s with QC; 9/14: 0.92 m/s with QC, without AD 0.91 m/s; 01/15/2022= 0.95 m/s with hurrycane (3point) Goal status: Partially MET  3.  Patient will increase BLE gross strength by 1/2 point on MMT as to  improve functional strength for independent gait, increased standing tolerance and increased ADL ability. Baseline: grossly 4+/5 BLE, with exception of R ankle testing deferred at eval; 9/15: grossly 4+/5 B including R ankle; 01/15/2022= 4+/5 right ankle DF/EV Goal status: IN PROGRESS  4.   Patient will demonstrate an improved Berg Balance Score of at least 3 points or greater as to demonstrate improved balance with ADLs such as sitting/standing and transfer balance and reduced fall risk.  Baseline: 8/15: 51; 9/14: 54/56 Goal status: MET  5.  Patient will increase lower extremity functional scale to >60/80 to demonstrate improved functional mobility and increased tolerance with ADLs.  Baseline: 44/80; 9/14: 50/80; 01/15/2022= 50/80 Goal status: PROGRESSING   6.  Patient will increase six minute  walk test distance to >1300 ft for improve gait ability and return to PLOF to complete her walks in her neighborhood.  Baseline: 1194 ft without AD, with SBA, pt reported feeling fatigued in hips around minutes 4; 01/15/2022= 1275 feet with Hurrycane Goal status: PROGRESSING    PLAN: PT FREQUENCY: 2x/week  PT DURATION: 12 weeks  PLANNED INTERVENTIONS: Therapeutic exercises, Therapeutic activity, Neuromuscular re-education, Balance training, Gait training, Patient/Family education, Self Care, Joint mobilization, Joint manipulation, Stair training, Vestibular training, Canalith repositioning, Visual/preceptual remediation/compensation, Orthotic/Fit training, DME instructions, Electrical stimulation, Spinal mobilization, Cryotherapy, Moist heat, Splintting, Taping, Ultrasound, Manual therapy, and Re-evaluation  PLAN FOR NEXT SESSION:  d/c with recommendation of starting Pelvic PT   Zollie Pee, PT 01/17/2022, 4:34 PM

## 2022-01-22 ENCOUNTER — Ambulatory Visit: Payer: Medicare PPO

## 2022-01-22 DIAGNOSIS — R278 Other lack of coordination: Secondary | ICD-10-CM

## 2022-01-22 DIAGNOSIS — M6281 Muscle weakness (generalized): Secondary | ICD-10-CM | POA: Diagnosis not present

## 2022-01-22 DIAGNOSIS — I63511 Cerebral infarction due to unspecified occlusion or stenosis of right middle cerebral artery: Secondary | ICD-10-CM

## 2022-01-22 NOTE — Therapy (Signed)
OUTPATIENT OCCUPATIONAL THERAPY NEURO TREATMENT NOTE Patient Name: Teresa [name] MRN: [id number] DOB:[birth year], 81 y.o., female Today's Date: 12/13/2021  PCP: Dr. [name] REFERRING PROVIDER: Dr. [name]     OT End of Session - 01/22/22 1452     Visit Number [id number]    Number of Visits 72    Date for OT Re-Evaluation 01/31/22    Authorization Time Period Reporting period beginning 12/25/21    OT Start Time 1100    OT Stop Time 1145    OT Time Calculation (min) 45 min    Activity Tolerance Patient tolerated treatment well    Behavior During Therapy [facility] for tasks assessed/performed             Past Medical History:  Diagnosis Date   Cancer ([place])    skin   Hypothyroidism    Raynaud's disease    Past Surgical History:  Procedure Laterality Date   [place]   BACK SURGERY  2010   Cervical fusion C4-5-6-7   CATARACT EXTRACTION, BILATERAL Bilateral 10/2016   CHOLECYSTECTOMY  1995   COLONOSCOPY WITH PROPOFOL N/A 12/08/2018   Procedure: COLONOSCOPY WITH PROPOFOL;  Surgeon: Toledo, Benay Pike, MD;  Location: [facility] ENDOSCOPY;  Service: Gastroenterology;  Laterality: N/A;   EYE SURGERY     Patient Active Problem List   Diagnosis Date Noted   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) 02/27/2021   History of nonmelanoma skin cancer 05/23/2014    ONSET DATE: 02/26/2021  REFERRING DIAG: R MCA CVA  THERAPY DIAG:  Muscle weakness (generalized)  Other lack of coordination  Right middle cerebral artery stroke [facility])  Rationale for Evaluation and Treatment Rehabilitation  PERTINENT HISTORY: February 26, 2021, pt reports she had a CVA, came to [facility] to ER and then was transferred to [facility] in [place] where she was admitted and after acute care she went to inpatient rehab.  Following inpt rehab, pt went home and had home health.   PRECAUTIONS: fall  SUBJECTIVE:  Pt reports she's  looking forward to dinner and a show tonight in [place] with her friends.   PAIN:  Are you having pain? Yes: NPRS scale: 2/10 Pain location:   R upper arm from RSV shot yesterday, L upper trap, pec Pain description: tightness, sore, tender Aggravating factors: N/A Relieving factors: rest, heat, otc pain meds   OBJECTIVE:  L grip 6, R grip 41 L lateral pinch 5, R 13 L 3 point pinch 4, R 14  L 9 hole 1 min 43 sec. L shoulder active flexion 0-105, passive 0-125  TODAY'S TREATMENT:  Moist heat applied to L shoulder  Therapeutic Exercise: Performed gentle passive scapular mobility for protraction/retraction/elevation/depression.  Performed gentle passive stretching within pain free range for L shoulder all planes, forearm pron/sup; good tolerance.  Completed LUE strengthening with 1# weight to perform wrist flex/ext/RU/RD, pron/sup, elbow flex, and active assisted ER with arm abducted to 80 (OT providing support for this position), active assisted shoulder flex and abd x3 sets 10 reps each.  No weight used for ER today d/t pt reporting increased soreness in L shoulder.  Pt completed 3 sets 10 reps of grip squeezes using moderate resistance on hand gripper (1 green band), cues to maximize finger closure with each squeeze.    Neuro re-ed: Facilitated L hand [facility] skills working to pick up small pegs from horizontal position and place vertically into pegboard.  Pt  warmed up with first removing pegs from pegboard to practice 3 point pinch prehension.  Several dropped pegs when working to place them into board, but on occasion was able to reposition peg in hand in prep for placement instead of putting peg back down to grip it from an alternate angle.  PATIENT EDUCATION: Education details: LUE strengthening Person educated: pt Education method: Explanation and Verbal cues Education comprehension: verbalized understanding   HOME EXERCISE PROGRAM Continue to engage LUE into ADLs; continue  gripping and pinching exercises for LUE, and L shoulder AROM/AAROM, crochet , typing    OT Long Term Goals -       OT LONG TERM GOAL #1   Title Pt will be independent with home exercise program.    Baseline Eval: no current program, 10th visit:  continue to add new exercises as pt progresses, 20th:  continue to update HEP; 08/17/21: continue to progress HEP when indicated.  Visit 40: adding new exercises as pt progresses; 12/25/21: ongoing with progressions   Time 12    Period Weeks    Status On-going    Target Date 01/31/22      OT LONG TERM GOAL #2   Title Pt will complete UB and LB dressing with modified independence including buttons, snaps and zippers.    Baseline requires min assist at eval, 10th visit: occasional assist with buttons, 20th:  able to perform one handed, but difficulty with bilateral UE; 08/17/21: pt reports inconsistent with 1 hand, reviewed techniques this visit, Visit 40:  Pt requires assist with bra   Time 12    Period Weeks    Status MET   Target Date 11/08/21     OT LONG TERM GOAL #3   Title Pt will perform shower transfer with modified independence.    Baseline Pt requires supervision to min assist for shower transfer at home. 10th visit: supervision; 08/17/21: supv .  Visit 40:  Pt had met goal but had recent fall and now requiring min guard to supv   Time 6    Period Weeks    Status  Met    Target Date 11/08/21      OT LONG TERM GOAL #4   Title Pt will improve L hand grip by 10# to assist with holding items in left hand securely.    Baseline no grip in left hand at eval, 10th visit:  improved flexion but still working towards composite fisting and grip. 20th:  continues to demo decreased grip; 08/17/21: active digit flexion improving, but not yet able to register grip on dynamometer; 09/04/21: L grip 1# 7/17:  5#; 12/25/21: L grip 6#   Time 12    Period Weeks    Status On-going    Target Date 01/31/22      OT LONG TERM GOAL #5   Title Pt will improve left  shoulder flexion to 100 degrees or better to improve reaching to obtain self care items from shelf/shoulder height.    Baseline difficulty with reach, shoulder flexion to 47 degrees; 08/17/21: L shoulder flexion 85, but not yet able to consistently hold ADL supplies in L hand when reaching; 09/04/21: flexion 85 7/17:  shoulder flexion to 90; 12/25/21: 105 (P 125)    Time 12    Period Weeks    Status On-going    Target Date 01/31/22      OT LONG TERM GOAL #6   Title Pt will improve FOTO score to 47 or above to demonstrate a clinically  relevant change in function to impact ADL tasks.    Baseline score of 30 at eval; 08/17/21: FOTO: 42; 09/04/21: FOTO : 54 , FOTO 7/7:  60; 12/25/21: 59   Time 12    Period Weeks    Status  MET/ongoing    Target Date 01/31/22      OT LONG TERM GOAL #7   Title Pt will demonstrate ability to pick up small objects and complete 9 hole peg test in less than 1 min 30 sec (revised from under 2 min)   Baseline unable to perform at eval, 10th visit: still unable to pick up small pegs, 20th: unable to pick up pegs but improving; 08/17/21: not attempted d/t time constraints, will assess next visit; 09/04/21: 9 hole peg test in 5 min 53 sec;  7/17:  Pt unable to complete this date but was able to place 6 of 9 pegs in 4 mins 20 secs; 12/25/21: L 1 min 43 sec   Time 12    Period Weeks    Status Revised/On-going    Target Date 01/31/2022             Plan -     Clinical Impression Statement Pt continues to show improved control of 1# hand held weight during LUE strengthening this date.  Continue to perform AAROM for the L shoulder planes to maximize end range and minimize compensatory movements.   When working with small pegs, pt warmed up with first removing pegs from pegboard to practice 3 point pinch prehension.  Several dropped pegs when working to place them into board, but on occasion was able to reposition peg in hand in prep for placement instead of putting peg back down to  grip it from an alternate angle.  Pt will continue to work towards goals in plan of care to improve left UE function, decrease pain and increase active normal movement patterns for daily tasks.    OT Occupational Profile and History Detailed Assessment- Review of Records and additional review of physical, cognitive, psychosocial history related to current functional performance    Occupational performance deficits (Please refer to evaluation for details): ADL's;IADL's;Leisure;Rest and Sleep    Body Structure / Function / Physical Skills ADL;Coordination;Endurance;GMC;UE functional use;Balance;IADL;Pain;Dexterity;FMC;Strength;Edema;Mobility;ROM    Psychosocial Skills Environmental  Adaptations;Habits;Routines and Behaviors    Rehab Potential Good    Clinical Decision Making Several treatment options, min-mod task modification necessary   Comorbidities Affecting Occupational Performance: May have comorbidities impacting occupational performance    Modification or Assistance to Complete Evaluation  Min-Moderate modification of tasks or assist with assess necessary to complete eval    OT Frequency 2x / week    OT Duration 12 weeks    OT Treatment/Interventions Self-care/ADL training;Cryotherapy;Paraffin;Therapeutic exercise;DME and/or AE instruction;Functional Mobility Training;Balance training;Electrical Stimulation;Ultrasound;Neuromuscular education;Manual Therapy;Splinting;Moist Heat;Contrast Bath;Passive range of motion;Therapeutic activities;Patient/family education;Coping strategies training    Plan OT recert    Consulted and Agree with Plan of Care Patient            Leta Speller, MS, OTR/L  Darleene Cleaver, OT 01/22/2022, 2:53 PM

## 2022-01-22 NOTE — Progress Notes (Unsigned)
[facility] [address] [place]. [place] [address] [phone]       OFFICE FOLLOW UP NOTE  Ms. Teresa [name] Date of Birth:  1941/01/16 Medical Record Number:  [id number]   Referring MD: Alvin Critchley  Primary neurologist: Dr. Leonie Man Reason for Referral: Stroke   No chief complaint on file.      HPI:   Update 01/23/2022 JM: patient returns for 6 month stroke follow up visit. Overall doing well without new stroke/TIA symptoms. Reports continued ***. Completed therapies on 10/26, continues HEP ***. Ambulates with ***, denies any recent falls. Remains on plavix and Crestor. Blood pressure well controlled. Routinely follows with PCP Dr. Doy Hutching. Loop recorder has not shown A fib thus far.       History provided for reference purposes only Update 08/21/2021 JM: Patient returns for 67-monthstroke follow-up accompanied by her husband.  She was previously seen by Dr. SLeonie Man  Continues to work with PT/OT making gradual recovery of left-sided weakness.  Denies any residual left leg weakness.  Ambulates with quad cane while outdoors and for long distance, does not use in home, denies any recent falls.  Will have difficulty making turns.  Continues to have hand and arm weakness, has difficulty picking up objects with left hand, use of compression glove which helps with hand swelling. Compliant on Plavix and Crestor, denies significant side effects.  Blood pressure today 138/79.  Does not routinely monitor at home as typically stable. Had loop recorder placed 4/13 by Dr. KCaryl Comeswith initial download scheduled today.  Routinely follows with PCP.  No new concerns at this time.  Consult visit 05/17/2021 Dr. SLeonie Man Ms. PDemelois a 81year old lady seen today for initial office consultation visit for stroke.  She is accompanied by her husband.  History is obtained from them and review of electronic medical records and opossum reviewed available pertinent imaging films in PACS.  She  has past medical history of Raynaud's disease, hypothyroidism and skin cancer.  [facility].  She she was fine till 820 in the morning that day when she went for a walk with her neighbor.  She was brought in as a code stroke on initial evaluation by neurologist Dr. SQuinn Axepatient had some inconsistent features on exam with some left-sided weakness but some giveaway weakness which could be overcome with coaching and exam was fluctuating due to inconsistency with exam thrombolysis and was not done but urgent MRI scan of the brain was obtained which actually did not show an acute stroke.  Subsequently a CT angiogram was obtained which showed a right M2 branch occlusion and at that time the CT scan showed a low-density in the right frontal and subinsular region consistent with acute stroke.  Echocardiogram showed ejection fraction of 65 to 70% without cardiac source of embolism.  Hemoglobin A1c of 5.4.  LDL cholesterol is 83.7.  Urine urine drug screen was negative.  Plan was to do outpatient cardiac monitoring in the discharge summary however it seems to not have happened so far.  [facility] and is now being at home.  She is finished home physical and occupational therapy and plans to start outpatient therapy soon.  She is now able to walk and uses a cane.  Left leg is still heavy and drags.  Speech has recovered back to baseline.  She is currently on Plavix which is tolerating well without bruising or bleeding.  Her blood pressure she states is quite well controlled at home though it is elevated slightly in my office today at 149/68.  She remains on Crestor which is tolerating well without muscle aches and pains.  She has no other complaints today.    ROS:   14 system review of systems is positive for those listed in HPI and all other systems  negative  PMH:  Past Medical History:  Diagnosis Date   Cancer ([place])    skin   Hypothyroidism    Raynaud's disease     Social History:  Social History   Socioeconomic History   Marital status: Married    Spouse name: Teresa [name]   Number of children: Not on file   Years of education: Not on file   Highest education level: Not on file  Occupational History   Not on file  Tobacco Use   Smoking status: Never   Smokeless tobacco: Never  Vaping Use   Vaping Use: Never used  Substance and Sexual Activity   Alcohol use: Never   Drug use: Never   Sexual activity: Not on file  Other Topics Concern   Not on file  Social History Narrative   Not on file   Social Determinants of Health   Financial Resource Strain: Not on file  Food Insecurity: Not on file  Transportation Needs: Not on file  Physical Activity: Not on file  Stress: Not on file  Social Connections: Not on file  Intimate Partner Violence: Not on file    Medications:   Current Outpatient Medications on File Prior to Visit  Medication Sig Dispense Refill   Acetylcysteine (NAC) 600 MG CAPS Take 1 capsule (600 mg total) by mouth 2 (two) times daily. 60 capsule 0   Calcium Carb-Cholecalciferol 600-10 MG-MCG TABS Take 2 tablets by mouth daily. 60 tablet 0   clopidogrel (PLAVIX) 75 MG tablet Take 1 tablet (75 mg total) by mouth daily. 90 tablet 0   cyclobenzaprine (FLEXERIL) 10 MG tablet Take by mouth.     diclofenac Sodium (VOLTAREN) 1 % GEL Apply 2 g topically 4 (four) times daily. 200 g 0   doxycycline (PERIOSTAT) 20 MG tablet TAKE 1 TABLET TWICE DAILY WITH FOOD 180 tablet 4   DULoxetine (CYMBALTA) 30 MG capsule Take 30 mg by mouth daily.     famotidine (PEPCID) 40 MG tablet Take 1 tablet by mouth at bedtime.     gabapentin (NEURONTIN) 300 MG capsule Take 1 capsule (300 mg total) by mouth at bedtime. 30 capsule 0   levothyroxine (SYNTHROID) 100 MCG tablet Take 1 tablet (100 mcg total) by mouth daily before  breakfast. 30 tablet 0   Multiple Vitamins-Minerals (MULTIVITAMIN WITH MINERALS) tablet Take 1 tablet by mouth daily.     nabumetone (RELAFEN) 500 MG tablet Take by mouth.     nisoldipine (SULAR) 34 MG 24 hr tablet Take 34 mg by mouth daily.     omega-3 acid ethyl esters (LOVAZA) 1 g capsule Take 1 capsule (1,000 mg total) by mouth daily. 30 capsule 0   oxyCODONE (OXY IR/ROXICODONE) 5 MG immediate release tablet 1 tablet every 6-8 hours prn pain. 30 tablet 0   rosuvastatin (CRESTOR) 10 MG tablet Take 1 tablet (10 mg total) by mouth at bedtime. 30 tablet 0   Vitamin D3 (VITAMIN D) 25 MCG tablet Take 1 tablet (1,000 Units total) by mouth daily. 30 tablet 0  No current facility-administered medications on file prior to visit.    Allergies:   Allergies  Allergen Reactions   Codeine Itching and Rash    Other reaction(s): Vomiting   Misc. Sulfonamide Containing Compounds Rash   Penicillins Itching, Rash and Swelling   Sulfa Antibiotics Hives, Itching and Rash    Physical Exam There were no vitals filed for this visit.  There is no height or weight on file to calculate BMI.   General: well developed, well nourished, pleasant elderly lady seated, in no evident distress Head: head normocephalic and atraumatic.   Neck: supple with no carotid or supraclavicular bruits Cardiovascular: regular rate and rhythm, no murmurs Musculoskeletal: no deformity Skin:  no rash/petichiae Vascular:  Normal pulses all extremities  Neurologic Exam Mental Status: Awake and fully alert.  Fluent speech and language.  Oriented to place and time. Recent and remote memory intact. Attention span, concentration and fund of knowledge appropriate. Mood and affect appropriate.  Cranial Nerves: Pupils equal, briskly reactive to light. Extraocular movements full without nystagmus. Visual fields full to confrontation. Hearing intact. Facial sensation intact.  Mild left lower facial weakness., tongue, palate moves  normally and symmetrically. Motor: Normal strength and tone right upper and lower extremity  LUE: 4/5 proximal and 3+-4-/5 distal, increased tone distally, mild hand edema LLE: 5/5 Sensory.: intact to touch , pinprick , position and vibratory sensation.  Coordination: Rapid alternating movements normal on right side. Finger-to-nose performed accurately RUE and heel-to-shin performed accurately bilaterally. Gait and Station: Arises from chair with mild difficulty. Stance is normal.  Gait demonstrates decreased LLE stride length and step height with use of four-point cane.  Unble to heel, toe and tandem walk Reflexes: 2+ on the left and 1+ on the right.. Toes downgoing.         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 S/p ILR 07/04/2021 by Dr. Caryl Comes.  She is doing well but has mild residual left spastic hemiparesis and gait impairment/imbalance.  Vascular risk factors of hyperlipidemia and hypertension     PLAN:  -Continue working with PT/OT for hopeful ongoing recovery -Consider use of Botox for left hand spasticity -she will further discuss with OT and call office if interested in pursuing in the future -loop recorder has not shown atrial fibrillation thus far -Continue Plavix and Crestor 10 mg daily for secondary stroke prevention measures -Ensure continued close PCP follow-up for aggressive stroke risk factor management including BP goal<130/90 and HLD with LDL goal<70    Follow-up in 5 months or call earlier if needed    CC:  Idelle Crouch, MD   I spent 32 minutes of face-to-face and non-face-to-face time with patient and husband.  This included previsit chart review, lab review, study review, electronic health record documentation, patient and husband education and discussion regarding history of prior stroke with residual deficits, secondary stroke prevention measures and  aggressive stroke risk factor management and answered all other questions to patient's and husband's satisfaction  Frann Rider, [facility]  [facility] [address] [place], [place] [address]  Phone [phone] Fax [phone] Note: This document was prepared with digital dictation and possible smart phrase technology. Any transcriptional errors that result from this process are unintentional.

## 2022-01-23 ENCOUNTER — Encounter: Payer: Self-pay | Admitting: Adult Health

## 2022-01-23 ENCOUNTER — Ambulatory Visit (INDEPENDENT_AMBULATORY_CARE_PROVIDER_SITE_OTHER): Payer: Medicare PPO | Admitting: Adult Health

## 2022-01-23 VITALS — BP 138/84 | HR 61 | Ht 62.0 in | Wt 149.0 lb

## 2022-01-23 DIAGNOSIS — G8114 Spastic hemiplegia affecting left nondominant side: Secondary | ICD-10-CM

## 2022-01-23 DIAGNOSIS — I639 Cerebral infarction, unspecified: Secondary | ICD-10-CM

## 2022-01-23 NOTE — Patient Instructions (Signed)
Continue working with occupational therapy, will follow up with them regarding potential candidate for Vivistim   Continue clopidogrel 75 mg daily  and Crestor for secondary stroke prevention  Loop recorder has not shown atrial fibrillation thus far, will continue to be monitored by cardiology  Continue to follow up with PCP regarding blood pressure and cholesterol management  Maintain strict control of hypertension with blood pressure goal below 130/90 and cholesterol with LDL cholesterol (bad cholesterol) goal below 70 mg/dL.   Signs of a Stroke? Follow the BEFAST method:  Balance Watch for a sudden loss of balance, trouble with coordination or vertigo Eyes Is there a sudden loss of vision in one or both eyes? Or double vision?  Face: Ask the person to smile. Does one side of the face droop or is it numb?  Arms: Ask the person to raise both arms. Does one arm drift downward? Is there weakness or numbness of a leg? Speech: Ask the person to repeat a simple phrase. Does the speech sound slurred/strange? Is the person confused ? Time: If you observe any of these signs, call [phone].       Thank you for coming to see [place] at [facility]. I hope we have been able to provide you high quality care today.  You may receive a patient satisfaction survey over the next few weeks. We would appreciate your feedback and comments so that we may continue to improve ourselves and the health of our patients.

## 2022-01-24 ENCOUNTER — Ambulatory Visit (INDEPENDENT_AMBULATORY_CARE_PROVIDER_SITE_OTHER): Payer: Medicare PPO | Admitting: Dermatology

## 2022-01-24 ENCOUNTER — Ambulatory Visit: Payer: Medicare PPO

## 2022-01-24 ENCOUNTER — Encounter: Payer: Self-pay | Admitting: Dermatology

## 2022-01-24 ENCOUNTER — Ambulatory Visit: Payer: Medicare PPO | Attending: Internal Medicine

## 2022-01-24 DIAGNOSIS — Z1283 Encounter for screening for malignant neoplasm of skin: Secondary | ICD-10-CM

## 2022-01-24 DIAGNOSIS — L821 Other seborrheic keratosis: Secondary | ICD-10-CM

## 2022-01-24 DIAGNOSIS — Z85828 Personal history of other malignant neoplasm of skin: Secondary | ICD-10-CM

## 2022-01-24 DIAGNOSIS — N393 Stress incontinence (female) (male): Secondary | ICD-10-CM | POA: Insufficient documentation

## 2022-01-24 DIAGNOSIS — L720 Epidermal cyst: Secondary | ICD-10-CM

## 2022-01-24 DIAGNOSIS — L570 Actinic keratosis: Secondary | ICD-10-CM

## 2022-01-24 DIAGNOSIS — L719 Rosacea, unspecified: Secondary | ICD-10-CM | POA: Diagnosis not present

## 2022-01-24 DIAGNOSIS — D229 Melanocytic nevi, unspecified: Secondary | ICD-10-CM

## 2022-01-24 DIAGNOSIS — I63511 Cerebral infarction due to unspecified occlusion or stenosis of right middle cerebral artery: Secondary | ICD-10-CM | POA: Diagnosis present

## 2022-01-24 DIAGNOSIS — L700 Acne vulgaris: Secondary | ICD-10-CM

## 2022-01-24 DIAGNOSIS — D692 Other nonthrombocytopenic purpura: Secondary | ICD-10-CM

## 2022-01-24 DIAGNOSIS — L814 Other melanin hyperpigmentation: Secondary | ICD-10-CM

## 2022-01-24 DIAGNOSIS — M6281 Muscle weakness (generalized): Secondary | ICD-10-CM | POA: Insufficient documentation

## 2022-01-24 DIAGNOSIS — R2689 Other abnormalities of gait and mobility: Secondary | ICD-10-CM | POA: Diagnosis present

## 2022-01-24 DIAGNOSIS — R278 Other lack of coordination: Secondary | ICD-10-CM | POA: Diagnosis present

## 2022-01-24 DIAGNOSIS — L578 Other skin changes due to chronic exposure to nonionizing radiation: Secondary | ICD-10-CM

## 2022-01-24 DIAGNOSIS — L309 Dermatitis, unspecified: Secondary | ICD-10-CM

## 2022-01-24 MED ORDER — DOXYCYCLINE HYCLATE 20 MG PO TABS: 20.0000 mg | ORAL_TABLET | Freq: Two times a day (BID) | ORAL | 4 refills | Status: DC

## 2022-01-24 MED ORDER — TRETINOIN 0.025 % EX CREA: TOPICAL_CREAM | CUTANEOUS | 5 refills | Status: AC

## 2022-01-24 MED ORDER — TRIAMCINOLONE ACETONIDE 0.1 % EX CREA: TOPICAL_CREAM | CUTANEOUS | 2 refills | Status: AC

## 2022-01-24 NOTE — Therapy (Signed)
OUTPATIENT OCCUPATIONAL THERAPY NEURO TREATMENT NOTE Patient Name: Teresa [name] MRN: [id number] DOB:12/03/1940, 81 y.o., female Today's Date: 12/13/2021  PCP: Dr. [name] REFERRING PROVIDER: Dr. [name]     OT End of Session - 01/24/22 1422     Visit Number [id number]    Number of Visits 72    Date for OT Re-Evaluation 01/31/22    Authorization Time Period Reporting period beginning 12/25/21    OT Start Time 1100    OT Stop Time 1145    OT Time Calculation (min) 45 min    Activity Tolerance Patient tolerated treatment well    Behavior During Therapy [facility] for tasks assessed/performed             Past Medical History:  Diagnosis Date   Cancer ([place])    skin   Hypothyroidism    Raynaud's disease    Past Surgical History:  Procedure Laterality Date   [place]   BACK SURGERY  2010   Cervical fusion C4-5-6-7   CATARACT EXTRACTION, BILATERAL Bilateral 10/2016   CHOLECYSTECTOMY  1995   COLONOSCOPY WITH PROPOFOL N/A 12/08/2018   Procedure: COLONOSCOPY WITH PROPOFOL;  Surgeon: Toledo, Benay Pike, MD;  Location: [facility] ENDOSCOPY;  Service: Gastroenterology;  Laterality: N/A;   EYE SURGERY     Patient Active Problem List   Diagnosis Date Noted   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  Following inpt rehab, pt went home and had home health.   PRECAUTIONS: fall  SUBJECTIVE:  Pt saw neurologist  yesterday.  NP discussed possibility of doing vivistim, but pt will discuss further with MD.  Pt reports she will not have to follow up with neurologist any more for her CVA.  PAIN:  Are you having pain? no   OBJECTIVE:  L grip 6, R grip 41 L lateral pinch 5, R 13 L 3 point pinch 4, R 14  L 9 hole 1 min 43 sec. L shoulder active flexion 0-105, passive 0-125  TODAY'S TREATMENT:  Moist heat applied to L shoulder  Manual Therapy: STM to L upper trap, working to reduce muscle stiffness and muscle imbalance between L/R neck and shoulders.  Therapeutic Exercise: Performed gentle passive scapular mobility for protraction/retraction/elevation/depression.  Performed gentle passive stretching within pain free range for L shoulder all planes, forearm pron/sup; good tolerance.  Pt completed 3 sets 10 reps of grip squeezes using moderate resistance on hand gripper (1 green band), min vc to maximize finger closure with each squeeze.    Neuro re-ed: Facilitated L hand [facility] skills working to pick up small pegs from peg board with a 2 point pinch to practice this prehension pattern which she uses to pull the yard when she crochets.  Graded activity with having pt place and remove pegs from resistive putty, and then placed pegs into small pegboard using same 2 point pinch pattern.  Challenged pt to push pegs deeper into putty  for more resistive pull.  Additional challenge to reduce proximal compensatory movements during [facility] by resting elbow and forearm on table top when pulling and placing pegs, moderate tactile and vc provided.    PATIENT EDUCATION: Education details: LUE strengthening Person educated: pt Education method: Explanation and Verbal cues Education comprehension: verbalized understanding   HOME EXERCISE PROGRAM Continue to engage LUE into ADLs; continue gripping and pinching exercises for LUE, and L shoulder AROM/AAROM, crochet , typing; increase participation in IADL tasks for greater use  of L hand.    OT Long Term Goals -       OT LONG TERM GOAL #1   Title Pt will be independent with home exercise program.    Baseline Eval: no current program, 10th visit:  continue to add new exercises as pt progresses, 20th:  continue to update HEP; 08/17/21: continue to progress HEP when indicated.  Visit 40: adding new exercises as pt progresses; 12/25/21: ongoing with progressions   Time 12    Period Weeks    Status On-going    Target Date 01/31/22      OT LONG TERM GOAL #2   Title Pt will complete UB and LB dressing with modified independence including buttons, snaps and zippers.    Baseline requires min assist at eval, 10th visit: occasional assist with buttons, 20th:  able to perform one handed, but difficulty with bilateral UE; 08/17/21: pt reports inconsistent with 1 hand, reviewed techniques this visit, Visit 40:  Pt requires assist with bra   Time 12    Period Weeks    Status MET   Target Date 11/08/21     OT LONG TERM GOAL #3   Title Pt will perform shower transfer with modified independence.    Baseline Pt requires supervision to min assist for shower transfer at home. 10th visit: supervision; 08/17/21: supv .  Visit 40:  Pt had met goal but had recent fall and now requiring min guard to supv   Time 6    Period Weeks    Status  Met    Target Date 11/08/21      OT LONG TERM GOAL #4   Title Pt will improve L hand grip by 10# to assist with holding items in left hand securely.    Baseline no grip in left hand at eval, 10th visit:  improved flexion but still working towards composite fisting and grip. 20th:  continues to demo decreased grip; 08/17/21: active digit flexion improving, but not yet able to register grip on dynamometer; 09/04/21: L grip 1# 7/17:  5#; 12/25/21: L grip 6#   Time 12    Period Weeks    Status On-going    Target Date 01/31/22      OT LONG TERM GOAL #5   Title Pt will improve left shoulder flexion to 100 degrees or better to improve reaching to obtain  self care items from shelf/shoulder height.    Baseline difficulty with reach, shoulder flexion to 47 degrees; 08/17/21: L shoulder flexion 85, but not yet able to consistently hold ADL supplies in L hand when reaching; 09/04/21: flexion 85 7/17:  shoulder flexion to 90; 12/25/21: 105 (P 125)    Time 12    Period Weeks    Status On-going    Target Date 01/31/22      OT LONG TERM GOAL #6   Title Pt will improve FOTO score to 47 or above to demonstrate a clinically relevant change in function to impact ADL  tasks.    Baseline score of 30 at eval; 08/17/21: FOTO: 42; 09/04/21: FOTO : 54 , FOTO 7/7:  60; 12/25/21: 59   Time 12    Period Weeks    Status  MET/ongoing    Target Date 01/31/22      OT LONG TERM GOAL #7   Title Pt will demonstrate ability to pick up small objects and complete 9 hole peg test in less than 1 min 30 sec (revised from under 2 min)   Baseline unable to perform at eval, 10th visit: still unable to pick up small pegs, 20th: unable to pick up pegs but improving; 08/17/21: not attempted d/t time constraints, will assess next visit; 09/04/21: 9 hole peg test in 5 min 53 sec;  7/17:  Pt unable to complete this date but was able to place 6 of 9 pegs in 4 mins 20 secs; 12/25/21: L 1 min 43 sec   Time 12    Period Weeks    Status Revised/On-going    Target Date 01/31/2022             Plan -     Clinical Impression Statement Pt had follow up with neurology yesterday and reports she won't have to return for additional follow ups.  NP discussed possible referral for Vivistim with pt.  Discussed possible benefits and risks with pt, and intense therapy necessary following the implantation of the device.  Encouraged pt to discuss further with MD.  Pt is unsure that she could commit to travelling to [facility] for 4-5 (?) days of therapy per week.  Focused today on 2 point pinch pattern with light resistance as pt states she uses this to pull her yarn when she crochets.  OT provided grading  of activity and challenged pt to reduce proximal compensatory movements during [facility] by resting elbow and forearm on table top when pulling and placing pegs, moderate tactile and vc provided.  Pt will continue to work towards goals in plan of care to improve left UE function, decrease pain and increase active normal movement patterns for daily tasks.    OT Occupational Profile and History Detailed Assessment- Review of Records and additional review of physical, cognitive, psychosocial history related to current functional performance    Occupational performance deficits (Please refer to evaluation for details): ADL's;IADL's;Leisure;Rest and Sleep    Body Structure / Function / Physical Skills ADL;Coordination;Endurance;GMC;UE functional use;Balance;IADL;Pain;Dexterity;FMC;Strength;Edema;Mobility;ROM    Psychosocial Skills Environmental  Adaptations;Habits;Routines and Behaviors    Rehab Potential Good    Clinical Decision Making Several treatment options, min-mod task modification necessary   Comorbidities Affecting Occupational Performance: May have comorbidities impacting occupational performance    Modification or Assistance to Complete Evaluation  Min-Moderate modification of tasks or assist with assess necessary to complete eval    OT Frequency 2x / week    OT Duration 12 weeks    OT Treatment/Interventions Self-care/ADL training;Cryotherapy;Paraffin;Therapeutic exercise;DME and/or AE instruction;Functional Mobility Training;Balance training;Electrical Stimulation;Ultrasound;Neuromuscular education;Manual Therapy;Splinting;Moist Heat;Contrast Bath;Passive range of motion;Therapeutic activities;Patient/family education;Coping strategies training    Plan OT recert    Consulted and Agree with Plan of Care Patient            Leta Speller, MS, OTR/L  Darleene Cleaver, OT 01/24/2022, 2:34 PM

## 2022-01-24 NOTE — Progress Notes (Signed)
Follow-Up Visit   Subjective  Teresa [name] is a 81 y.o. female who presents for the following: Annual Exam (Hx of skin cancer, right forearm).  The patient presents for Total-Body Skin Exam (TBSE) for skin cancer screening and mole check.  The patient has spots, moles and lesions to be evaluated, some may be new or changing and the patient has concerns that these could be cancer.  Patient accompanied by husband who contributes to history.   The following portions of the chart were reviewed this encounter and updated as appropriate:  Tobacco  Allergies  Meds  Problems  Med Hx  Surg Hx  Fam Hx      Review of Systems: No other skin or systemic complaints except as noted in HPI or Assessment and Plan.   Objective  Well appearing patient in no apparent distress; mood and affect are within normal limits.  A full examination was performed including scalp, head, eyes, ears, nose, lips, neck, chest, axillae, abdomen, back, buttocks, bilateral upper extremities, bilateral lower extremities, hands, feet, fingers, toes, fingernails, and toenails. All findings within normal limits unless otherwise noted below.  Head - Anterior (Face) Mid face erythema with telangiectasias  Left Zygoma x1, left forehead x1 (2) Erythematous thin papules/macules with gritty scale.   Left Anterior Neck x2 Smooth white erythematous papules  Left Anterior Neck Scaly erythematous papules and plaques  Right Buccal Cheek Trace open comedones   Assessment & Plan   History of Skin Cancer. Right forearm Tx >20 years ago Clear. Observe for recurrence.  Call clinic for new or changing lesions.   Recommend regular skin exams, daily broad-spectrum spf 30+ sunscreen use, and photoprotection.     Lentigines - Scattered tan macules - Due to sun exposure - Benign-appearing, observe - Recommend daily broad spectrum sunscreen SPF 30+ to sun-exposed areas, reapply every 2 hours as needed. - Call for any  changes  Seborrheic Keratoses - Stuck-on, waxy, tan-brown papules and/or plaques  - Benign-appearing - Discussed benign etiology and prognosis. - Observe - Call for any changes  Melanocytic Nevi - Tan-brown and/or pink-flesh-colored symmetric macules and papules - Benign appearing on exam today - Observation - Call clinic for new or changing moles - Recommend daily use of broad spectrum spf 30+ sunscreen to sun-exposed areas.  - Check nails when remove polish.   Hemangiomas - Red papules - Discussed benign nature - Observe - Call for any changes  Actinic Damage - Chronic condition, secondary to cumulative UV/sun exposure - diffuse scaly erythematous macules with underlying dyspigmentation - Recommend daily broad spectrum sunscreen SPF 30+ to sun-exposed areas, reapply every 2 hours as needed.  - Staying in the shade or wearing long sleeves, sun glasses (UVA+UVB protection) and wide brim hats (4-inch brim around the entire circumference of the hat) are also recommended for sun protection.  - Call for new or changing lesions.  Skin cancer screening performed today.  Purpura - Chronic; persistent and recurrent.  Treatable, but not curable. - Violaceous macules and patches - Benign - Related to trauma, age, sun damage and/or use of blood thinners, chronic use of topical and/or oral steroids - Observe - Can use OTC arnica containing moisturizer such as Dermend Bruise Formula if desired - Call for worsening or other concerns   Rosacea Head - Anterior (Face)  Chronic condition with duration or expected duration over one year. Currently well-controlled.  She reports doxycycline helps with eyes  Rosacea is a chronic progressive skin condition usually affecting the face  of adults, causing redness and/or acne bumps. It is treatable but not curable. It sometimes affects the eyes (ocular rosacea) as well. It may respond to topical and/or systemic medication and can flare with  stress, sun exposure, alcohol, exercise, topical steroids (including hydrocortisone/cortisone 10) and some foods.  Daily application of broad spectrum spf 30+ sunscreen to face is recommended to reduce flares.  Continue Doxycycline '20mg'$  1 tablet twice daily with food.   Doxycycline should be taken with food to prevent nausea. Do not lay down for 30 minutes after taking. Be cautious with sun exposure and use good sun protection while on this medication. Pregnant women should not take this medication.    AK (actinic keratosis) (2) Left Zygoma x1, left forehead x1  Actinic keratoses are precancerous spots that appear secondary to cumulative UV radiation exposure/sun exposure over time. They are chronic with expected duration over 1 year. A portion of actinic keratoses will progress to squamous cell carcinoma of the skin. It is not possible to reliably predict which spots will progress to skin cancer and so treatment is recommended to prevent development of skin cancer.  Recommend daily broad spectrum sunscreen SPF 30+ to sun-exposed areas, reapply every 2 hours as needed.  Recommend staying in the shade or wearing long sleeves, sun glasses (UVA+UVB protection) and wide brim hats (4-inch brim around the entire circumference of the hat). Call for new or changing lesions.  Curling iron burn vs AK at left helix.   Destruction of lesion - Left Zygoma x1, left forehead x1  Destruction method: cryotherapy   Informed consent: discussed and consent obtained   Lesion destroyed using liquid nitrogen: Yes   Region frozen until ice ball extended beyond lesion: Yes   Outcome: patient tolerated procedure well with no complications   Post-procedure details: wound care instructions given   Additional details:  Prior to procedure, discussed risks of blister formation, small wound, skin dyspigmentation, or rare scar following cryotherapy. Recommend Vaseline ointment to treated areas while healing.    Milia Left Anterior Neck x2  Irritated, itching.   Acne/Milia surgery - Left Anterior Neck x2 Procedure risks and benefits were discussed with the patient and verbal consent was obtained. Following prep of the skin on the neck with an alcohol swab, extraction of milia was performed with a comedone extractor following superficial incision made over their surfaces with a #15 surgical blade. Capillary hemostasis was achieved with 20% aluminum chloride solution. Vaseline ointment was applied to each site. The patient tolerated the procedure well.  tretinoin (RETIN-A) 0.025 % cream - Left Anterior Neck x2 Apply pea-sized amount to face at bedtime, wash off in morning.  Dermatitis Left Anterior Neck  Start Triamcinolone cream twice daily up to 2 weeks to affected body areas as needed for itching. Avoid applying to face, groin, and axilla. Use as directed. Long-term use can cause thinning of the skin.   Topical steroids (such as triamcinolone, fluocinolone, fluocinonide, mometasone, clobetasol, halobetasol, betamethasone, hydrocortisone) can cause thinning and lightening of the skin if they are used for too long in the same area. Your physician has selected the right strength medicine for your problem and area affected on the body. Please use your medication only as directed by your physician to prevent side effects.    triamcinolone cream (KENALOG) 0.1 % - Left Anterior Neck Apply twice daily up to 2 weeks to affected areas on body as needed for rash  Acne vulgaris Right Buccal Cheek  Chronic and persistent condition with duration  or expected duration over one year. Condition is symptomatic/ bothersome to patient. Not currently at goal.  Start Tretinoin 0.025% cream pea-sized amount at bedtime, wash off in morning.   Topical retinoid medications like tretinoin/Retin-A, adapalene/Differin, tazarotene/Fabior, and Epiduo/Epiduo Forte can cause dryness and irritation when first started. Only  apply a pea-sized amount to the entire affected area. Avoid applying it around the eyes, edges of mouth and creases at the nose. If you experience irritation, use a good moisturizer first and/or apply the medicine less often. If you are doing well with the medicine, you can increase how often you use it until you are applying every night. Be careful with sun protection while using this medication as it can make you sensitive to the sun. This medicine should not be used by pregnant women.     Return in about 1 year (around 01/25/2023) for TBSE.  I, Emelia Salisbury, CMA, am acting as scribe for Forest Gleason, MD.  Documentation: I have reviewed the above documentation for accuracy and completeness, and I agree with the above.  Forest Gleason, MD

## 2022-01-24 NOTE — Patient Instructions (Addendum)
Clogged pores/Milia/Acne: Start Tretinoin 0.025% cream pea-sized amount at bedtime, wash off in morning.   Topical retinoid medications like tretinoin/Retin-A, adapalene/Differin, tazarotene/Fabior, and Epiduo/Epiduo Forte can cause dryness and irritation when first started. Only apply a pea-sized amount to the entire affected area. Avoid applying it around the eyes, edges of mouth and creases at the nose. If you experience irritation, use a good moisturizer first and/or apply the medicine less often. If you are doing well with the medicine, you can increase how often you use it until you are applying every night. Be careful with sun protection while using this medication as it can make you sensitive to the sun. This medicine should not be used by pregnant women.     Rash/Itchy areas on body: Start Triamcinolone cream twice daily up to 2 weeks to affected body areas as needed for itching. Avoid applying to face, groin, and axilla. Use as directed. Long-term use can cause thinning of the skin.   Topical steroids (such as triamcinolone, fluocinolone, fluocinonide, mometasone, clobetasol, halobetasol, betamethasone, hydrocortisone) can cause thinning and lightening of the skin if they are used for too long in the same area. Your physician has selected the right strength medicine for your problem and area affected on the body. Please use your medication only as directed by your physician to prevent side effects.      Recommend taking Heliocare sun protection supplement daily in sunny weather for additional sun protection. For maximum protection on the sunniest days, you can take up to 2 capsules of regular Heliocare OR take 1 capsule of Heliocare Ultra. For prolonged exposure (such as a full day in the sun), you can repeat your dose of the supplement 4 hours after your first dose. Heliocare can be purchased at [employer], at some [facility] or at [email or web address].     Recommend daily broad  spectrum sunscreen SPF 30+ to sun-exposed areas, reapply every 2 hours as needed. Call for new or changing lesions.  Staying in the shade or wearing long sleeves, sun glasses (UVA+UVB protection) and wide brim hats (4-inch brim around the entire circumference of the hat) are also recommended for sun protection.    Melanoma ABCDEs  Melanoma is the most dangerous type of skin cancer, and is the leading cause of death from skin disease.  You are more likely to develop melanoma if you: Have light-colored skin, light-colored eyes, or red or blond hair Spend a lot of time in the sun Tan regularly, either outdoors or in a tanning bed Have had blistering sunburns, especially during childhood Have a close family member who has had a melanoma Have atypical moles or large birthmarks  Early detection of melanoma is key since treatment is typically straightforward and cure rates are extremely high if we catch it early.   The first sign of melanoma is often a change in a mole or a new dark spot.  The ABCDE system is a way of remembering the signs of melanoma.  A for asymmetry:  The two halves do not match. B for border:  The edges of the growth are irregular. C for color:  A mixture of colors are present instead of an even brown color. D for diameter:  Melanomas are usually (but not always) greater than 23m - the size of a pencil eraser. E for evolution:  The spot keeps changing in size, shape, and color.  Please check your skin once per month between visits. You can use a small mirror in front and  a large mirror behind you to keep an eye on the back side or your body.   If you see any new or changing lesions before your next follow-up, please call to schedule a visit.  Please continue daily skin protection including broad spectrum sunscreen SPF 30+ to sun-exposed areas, reapplying every 2 hours as needed when you're outdoors.   Staying in the shade or wearing long sleeves, sun glasses (UVA+UVB  protection) and wide brim hats (4-inch brim around the entire circumference of the hat) are also recommended for sun protection.    Due to recent changes in healthcare laws, you may see results of your pathology and/or laboratory studies on MyChart before the doctors have had a chance to review them. We understand that in some cases there may be results that are confusing or concerning to you. Please understand that not all results are received at the same time and often the doctors may need to interpret multiple results in order to provide you with the best plan of care or course of treatment. Therefore, we ask that you please give [place] 2 business days to thoroughly review all your results before contacting the office for clarification. Should we see a critical lab result, you will be contacted sooner.   If You Need Anything After Your Visit  If you have any questions or concerns for your doctor, please call our main line at [phone] and press option 4 to reach your doctor's medical assistant. If no one answers, please leave a voicemail as directed and we will return your call as soon as possible. Messages left after 4 pm will be answered the following business day.   You may also send [place] a message via [place]. We typically respond to MyChart messages within 1-2 business days.  For prescription refills, please ask your pharmacy to contact our office. Our fax number is [phone].  If you have an urgent issue when the clinic is closed that cannot wait until the next business day, you can page your doctor at the number below.    Please note that while we do our best to be available for urgent issues outside of office hours, we are not available 24/7.   If you have an urgent issue and are unable to reach [place], you may choose to seek medical care at your doctor's office, retail clinic, urgent care center, or emergency room.  If you have a medical emergency, please immediately call 911 or go to the  emergency department.  Pager Numbers  - Dr. Nehemiah Massed: [phone]  - Dr. Laurence Ferrari: [phone]  - Dr. Nicole Kindred: [phone]  In the event of inclement weather, please call our main line at [phone] for an update on the status of any delays or closures.  Dermatology Medication Tips: Please keep the boxes that topical medications come in in order to help keep track of the instructions about where and how to use these. Pharmacies typically print the medication instructions only on the boxes and not directly on the medication tubes.   If your medication is too expensive, please contact our office at [phone] option 4 or send [place] a message through [place].   We are unable to tell what your co-pay for medications will be in advance as this is different depending on your insurance coverage. However, we may be able to find a substitute medication at lower cost or fill out paperwork to get insurance to cover a needed medication.   If a prior authorization is required to get  your medication covered by your insurance company, please allow [place] 1-2 business days to complete this process.  Drug prices often vary depending on where the prescription is filled and some pharmacies may offer cheaper prices.  The website www.goodrx.com contains coupons for medications through different pharmacies. The prices here do not account for what the cost may be with help from insurance (it may be cheaper with your insurance), but the website can give you the price if you did not use any insurance.  - You can print the associated coupon and take it with your prescription to the pharmacy.  - You may also stop by our office during regular business hours and pick up a GoodRx coupon card.  - If you need your prescription sent electronically to a different pharmacy, notify our office through [facility] or by phone at [phone] option 4.     Si Usted Necesita Algo Despus de Su Visita  Tambin puede  enviarnos un mensaje a travs de Pharmacist, community. Por lo general respondemos a los mensajes de MyChart en el transcurso de 1 a 2 das hbiles.  Para renovar recetas, por favor pida a su farmacia que se ponga en contacto con nuestra oficina. Harland Dingwall de fax es [place] [phone].  Si tiene un asunto urgente cuando la clnica est cerrada y que no puede esperar hasta el siguiente da hbil, puede llamar/localizar a su doctor(a) al nmero que aparece a continuacin.   Por favor, tenga en cuenta que aunque hacemos todo lo posible para estar disponibles para asuntos urgentes fuera del horario de [place], no estamos disponibles las 24 horas del da, los 7 das de la [place].   Si tiene un problema urgente y no puede comunicarse con nosotros, puede optar por buscar atencin mdica  en el consultorio de su doctor(a), en una clnica privada, en un centro de atencin urgente o en una sala de emergencias.  Si tiene Engineering geologist, por favor llame inmediatamente al 911 o vaya a la sala de emergencias.  Nmeros de bper  - Dr. Nehemiah Massed: [phone]  - Dra. Moye: [phone]  - Dra. Nicole Kindred: [phone]  En caso de inclemencias del [place], por favor llame a Johnsie Kindred principal al [phone] para una actualizacin sobre el [place] de cualquier retraso o cierre.  Consejos para la medicacin en dermatologa: Por favor, guarde las cajas en las que vienen los medicamentos de uso tpico para ayudarle a seguir las instrucciones sobre dnde y cmo usarlos. Las farmacias generalmente imprimen las instrucciones del medicamento slo en las cajas y no directamente en los tubos del [place].   Si su medicamento es muy caro, por favor, pngase en contacto con Zigmund Daniel llamando al [phone] y presione la opcin 4 o envenos un mensaje a travs de Pharmacist, community.   No podemos decirle cul ser su copago por los medicamentos por adelantado ya que esto es diferente dependiendo de la cobertura de su seguro.  Sin embargo, es posible que podamos encontrar un medicamento sustituto a Electrical engineer un formulario para que el seguro cubra el medicamento que se considera necesario.   Si se requiere una autorizacin previa para que su compaa de seguros [place] su medicamento, por favor permtanos de 1 a 2 das hbiles para completar este proceso.  Los precios de los medicamentos varan con frecuencia dependiendo del Environmental consultant de dnde se surte la receta y alguna farmacias pueden ofrecer precios ms baratos.  El sitio web www.goodrx.com tiene cupones para medicamentos de Airline pilot. Los  precios aqu no tienen en cuenta lo que podra costar con la ayuda del seguro (puede ser ms barato con su seguro), pero el sitio web puede darle el precio si no utiliz Research scientist (physical sciences).  - Puede imprimir el cupn correspondiente y llevarlo con su receta a la farmacia.  - Tambin puede pasar por nuestra oficina durante el horario de atencin regular y Charity fundraiser una tarjeta de cupones de GoodRx.  - Si necesita que su receta se enve electrnicamente a una farmacia diferente, informe a nuestra oficina a travs de MyChart de [place] o por telfono llamando al [phone] y presione la opcin 4.

## 2022-01-29 ENCOUNTER — Ambulatory Visit: Payer: Medicare PPO

## 2022-01-29 DIAGNOSIS — R278 Other lack of coordination: Secondary | ICD-10-CM

## 2022-01-29 DIAGNOSIS — M6281 Muscle weakness (generalized): Secondary | ICD-10-CM

## 2022-01-29 DIAGNOSIS — I63511 Cerebral infarction due to unspecified occlusion or stenosis of right middle cerebral artery: Secondary | ICD-10-CM

## 2022-01-30 NOTE — Progress Notes (Signed)
Carelink Summary Report / Loop Recorder 

## 2022-01-30 NOTE — Therapy (Signed)
OUTPATIENT OCCUPATIONAL THERAPY NEURO TREATMENT NOTE Patient Name: Teresa [name] MRN: [id number] DOB:1940/11/04, 81 y.o., female Today's Date: 12/13/2021  PCP: Dr. [name] REFERRING PROVIDER: Dr. [name]     OT End of Session - 01/30/22 2159     Visit Number 54    Number of Visits 72    Date for OT Re-Evaluation 01/31/22    Authorization Time Period Reporting period beginning 12/25/21    OT Start Time 1145    OT Stop Time 1230    OT Time Calculation (min) 45 min    Activity Tolerance Patient tolerated treatment well    Behavior During Therapy [facility] for tasks assessed/performed             Past Medical History:  Diagnosis Date   Cancer ([place])    skin   Hypothyroidism    Raynaud's disease    Past Surgical History:  Procedure Laterality Date   [place]   BACK SURGERY  2010   Cervical fusion C4-5-6-7   CATARACT EXTRACTION, BILATERAL Bilateral 10/2016   CHOLECYSTECTOMY  1995   COLONOSCOPY WITH PROPOFOL N/A 12/08/2018   Procedure: COLONOSCOPY WITH PROPOFOL;  Surgeon: Toledo, Benay Pike, MD;  Location: [facility] ENDOSCOPY;  Service: Gastroenterology;  Laterality: N/A;   EYE SURGERY     Patient Active Problem List   Diagnosis Date Noted   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   Stroke ([place]) 02/27/2021   History of nonmelanoma skin cancer 05/23/2014    ONSET DATE: 02/26/2021  REFERRING DIAG: R MCA CVA  THERAPY DIAG:  Muscle weakness (generalized)  Other lack of coordination  Right middle cerebral artery stroke [facility])  Rationale for Evaluation and Treatment Rehabilitation  PERTINENT HISTORY: February 26, 2021, pt reports she had a CVA, came to [facility] to ER and then was transferred to [facility] in [place] where she was admitted and after acute care she went to inpatient rehab.  Following inpt rehab, pt went home and had home health.   PRECAUTIONS: fall  SUBJECTIVE:  Pt reports she used  her L hand with her curling iron on Sunday.  She also did a little crocheting over the weekend and reports it was a little easier than when she last attempted it.   PAIN:  Are you having pain? no   OBJECTIVE:  L grip 6, R grip 41 L lateral pinch 5, R 13 L 3 point pinch 4, R 14  L 9 hole 1 min 43 sec. L shoulder active flexion 0-105, passive 0-125  TODAY'S TREATMENT:  Moist heat applied to L shoulder  Therapeutic Exercise: Performed gentle passive scapular mobility for protraction/retraction/elevation/depression.  Performed passive stretching within pain free range for L shoulder all planes, forearm pron/sup; good tolerance.  Facilitated hand strengthening with use of hand gripper set at 6.6# to remove jumbo pegs from pegboard x1 trial using L hand.  Pt required 2 rest breaks and vc for technique to minimize dropped pegs.  Neuro re-ed: Facilitated L hand FMC/dexterity skills working to pick up small glass stones from table, scooping and storing in hand, and translatory skills moving stones from palm to fingers to discard without dropping others from palm.  Pt required intermittent vc for tighter closure of 4th and 5th digits to palm and increased supination of forearm in order to decrease dropped stones from ulnar side of palm.    PATIENT EDUCATION: Education details: LUE strengthening Person educated: pt Education method: Explanation and  Verbal cues Education comprehension: verbalized understanding   HOME EXERCISE PROGRAM Continue to engage LUE into ADLs; continue gripping and pinching exercises for LUE, and L shoulder AROM/AAROM, crocheting, typing; increase participation in IADL tasks for greater use of L hand.    OT Long Term Goals -       OT LONG TERM GOAL #1   Title Pt will be independent with home exercise program.    Baseline Eval: no current program, 10th visit:  continue to add new exercises as pt progresses, 20th:  continue to update HEP; 08/17/21: continue to progress  HEP when indicated.  Visit 40: adding new exercises as pt progresses; 12/25/21: ongoing with progressions   Time 12    Period Weeks    Status On-going    Target Date 01/31/22      OT LONG TERM GOAL #2   Title Pt will complete UB and LB dressing with modified independence including buttons, snaps and zippers.    Baseline requires min assist at eval, 10th visit: occasional assist with buttons, 20th:  able to perform one handed, but difficulty with bilateral UE; 08/17/21: pt reports inconsistent with 1 hand, reviewed techniques this visit, Visit 40:  Pt requires assist with bra   Time 12    Period Weeks    Status MET   Target Date 11/08/21     OT LONG TERM GOAL #3   Title Pt will perform shower transfer with modified independence.    Baseline Pt requires supervision to min assist for shower transfer at home. 10th visit: supervision; 08/17/21: supv .  Visit 40:  Pt had met goal but had recent fall and now requiring min guard to supv   Time 6    Period Weeks    Status  Met    Target Date 11/08/21      OT LONG TERM GOAL #4   Title Pt will improve L hand grip by 10# to assist with holding items in left hand securely.    Baseline no grip in left hand at eval, 10th visit:  improved flexion but still working towards composite fisting and grip. 20th:  continues to demo decreased grip; 08/17/21: active digit flexion improving, but not yet able to register grip on dynamometer; 09/04/21: L grip 1# 7/17:  5#; 12/25/21: L grip 6#   Time 12    Period Weeks    Status On-going    Target Date 01/31/22      OT LONG TERM GOAL #5   Title Pt will improve left shoulder flexion to 100 degrees or better to improve reaching to obtain self care items from shelf/shoulder height.    Baseline difficulty with reach, shoulder flexion to 47 degrees; 08/17/21: L shoulder flexion 85, but not yet able to consistently hold ADL supplies in L hand when reaching; 09/04/21: flexion 85 7/17:  shoulder flexion to 90; 12/25/21: 105 (P  125)    Time 12    Period Weeks    Status On-going    Target Date 01/31/22      OT LONG TERM GOAL #6   Title Pt will improve FOTO score to 47 or above to demonstrate a clinically relevant change in function to impact ADL tasks.    Baseline score of 30 at eval; 08/17/21: FOTO: 42; 09/04/21: FOTO : 54 , FOTO 7/7:  60; 12/25/21: 59   Time 12    Period Weeks    Status  MET/ongoing    Target Date 01/31/22  OT LONG TERM GOAL #7   Title Pt will demonstrate ability to pick up small objects and complete 9 hole peg test in less than 1 min 30 sec (revised from under 2 min)   Baseline unable to perform at eval, 10th visit: still unable to pick up small pegs, 20th: unable to pick up pegs but improving; 08/17/21: not attempted d/t time constraints, will assess next visit; 09/04/21: 9 hole peg test in 5 min 53 sec;  7/17:  Pt unable to complete this date but was able to place 6 of 9 pegs in 4 mins 20 secs; 12/25/21: L 1 min 43 sec   Time 12    Period Weeks    Status Revised/On-going    Target Date 01/31/2022             Plan -     Clinical Impression Statement L shoulder strength and flexibility continues to improve.  Pt reports she used her L hand with her curling iron on Sunday.  [facility] also continues to improve.  Pt reports she did a little crocheting over the weekend and reports it was a little easier than when she last attempted it.  Noted decreased proximal compensatory movements during table top [facility] activity this date, with pt resting elbow and forearm on table top while using her hand to pick up, store, and move stones from palm to fingertips.  Proximal stability was practiced last session during [facility] tasks, and pt carried this over today without cueing.  Pt was able to store more stones in hand this date (up to 6 before dropping). Pt required intermittent vc for tighter closure of 4th and 5th digits to palm and increased supination of forearm in order to decrease dropped stones from ulnar side of  palm.  Pt will continue to work towards goals in plan of care to improve left UE function, decrease pain and increase active normal movement patterns for daily tasks.    OT Occupational Profile and History Detailed Assessment- Review of Records and additional review of physical, cognitive, psychosocial history related to current functional performance    Occupational performance deficits (Please refer to evaluation for details): ADL's;IADL's;Leisure;Rest and Sleep    Body Structure / Function / Physical Skills ADL;Coordination;Endurance;GMC;UE functional use;Balance;IADL;Pain;Dexterity;FMC;Strength;Edema;Mobility;ROM    Psychosocial Skills Environmental  Adaptations;Habits;Routines and Behaviors    Rehab Potential Good    Clinical Decision Making Several treatment options, min-mod task modification necessary   Comorbidities Affecting Occupational Performance: May have comorbidities impacting occupational performance    Modification or Assistance to Complete Evaluation  Min-Moderate modification of tasks or assist with assess necessary to complete eval    OT Frequency 2x / week    OT Duration 12 weeks    OT Treatment/Interventions Self-care/ADL training;Cryotherapy;Paraffin;Therapeutic exercise;DME and/or AE instruction;Functional Mobility Training;Balance training;Electrical Stimulation;Ultrasound;Neuromuscular education;Manual Therapy;Splinting;Moist Heat;Contrast Bath;Passive range of motion;Therapeutic activities;Patient/family education;Coping strategies training    Plan OT recert    Consulted and Agree with Plan of Care Patient            Leta Speller, MS, OTR/L  Darleene Cleaver, OT 01/30/2022, 10:00 PM

## 2022-01-31 ENCOUNTER — Ambulatory Visit: Payer: Medicare PPO

## 2022-01-31 ENCOUNTER — Encounter: Payer: Self-pay | Admitting: Dermatology

## 2022-01-31 DIAGNOSIS — I63511 Cerebral infarction due to unspecified occlusion or stenosis of right middle cerebral artery: Secondary | ICD-10-CM

## 2022-01-31 DIAGNOSIS — M6281 Muscle weakness (generalized): Secondary | ICD-10-CM

## 2022-01-31 DIAGNOSIS — R278 Other lack of coordination: Secondary | ICD-10-CM

## 2022-02-02 NOTE — Therapy (Signed)
OUTPATIENT OCCUPATIONAL THERAPY RECERTIFICATION AND TREATMENT NOTE Patient Name: Teresa [name] MRN: [id number] DOB:Jul 28, 1940, 81 y.o., female Today's Date: 12/13/2021  PCP: Dr. [name] REFERRING PROVIDER: Dr. [name]     OT End of Session - 02/02/22 2011     Visit Number 70    Number of Visits 94    Date for OT Re-Evaluation 04/25/22    Authorization Time Period Reporting period beginning 12/25/21    OT Start Time 1100    OT Stop Time 1145    OT Time Calculation (min) 45 min    Activity Tolerance Patient tolerated treatment well    Behavior During Therapy [facility] for tasks assessed/performed             Past Medical History:  Diagnosis Date   Cancer ([place])    skin   Hypothyroidism    Raynaud's disease    Past Surgical History:  Procedure Laterality Date   [place]   BACK SURGERY  2010   Cervical fusion C4-5-6-7   CATARACT EXTRACTION, BILATERAL Bilateral 10/2016   CHOLECYSTECTOMY  1995   COLONOSCOPY WITH PROPOFOL N/A 12/08/2018   Procedure: COLONOSCOPY WITH PROPOFOL;  Surgeon: Toledo, Benay Pike, MD;  Location: [facility] ENDOSCOPY;  Service: Gastroenterology;  Laterality: N/A;   EYE SURGERY     Patient Active Problem List   Diagnosis Date Noted   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  Following inpt rehab, pt went home and had home health.   PRECAUTIONS: fall  SUBJECTIVE:  Pt  reports she did some more crocheting but forgot to bring it in to show therapist.   PAIN:  Are you having pain? no   OBJECTIVE:  L grip 6, R grip 41 L lateral pinch 5, R 13 L 3 point pinch 4, R 14  L 9 hole 1 min 43 sec. L shoulder active flexion 0-105, passive 0-125 02/02/22:  L grip 13 L lateral pinch 8 L 3 point pinch 5 L 9 hole 1 min 8 sec L shoulder active flexion 0-108  TODAY'S TREATMENT: Moist heat for muscle relaxation and tissue mobility alternated between R/L shoulders throughout session with simultaneous completion of exercises.    Therapeutic Exercise: Objective measures taken and goals updated for recert.   Performed gentle passive scapular mobility for protraction/retraction/elevation/depression.  Performed passive stretching within pain free range for L shoulder all planes, forearm pron/sup; good tolerance.  Facilitated hand strengthening with use of hand gripper set at moderate resistance with 1 green band for 2 sets, and light resistance with 1 red band for 1 set x10 reps each set.  Performed strengthening exercises with 1# dumbbell to complete wrist flex/ext/RD, forearm pron/sup, and elbow flex/ext x2 sets 10 reps each, min-mod tactile cues for form and technique.  Performed AAROM for the L shoulder with flex and abd no resistance; active ER with 1# dumbbell, min guard with L shoulder abducted to 80 degrees for 2  sets 10 reps each.  PATIENT EDUCATION: Education details: LUE strengthening Person educated: pt Education method: Explanation and Verbal cues Education comprehension: verbalized understanding   HOME EXERCISE PROGRAM Continue to engage LUE into ADLs; continue gripping and pinching exercises for LUE, and L shoulder AROM/AAROM, crocheting, typing; increase participation in IADL tasks for greater use of L hand.    OT Long Term Goals -       OT LONG TERM GOAL #1   Title Pt will be independent with home exercise program.    Baseline Eval: no current  program, 10th visit:  continue to add new exercises as pt progresses, 20th:  continue to update HEP; 08/17/21: continue to progress HEP when indicated.  Visit 40: adding new exercises as pt progresses; 12/25/21: ongoing with progressions; 01/31/22: ongoing   Time 12    Period Weeks    Status On-going    Target Date 04/25/22      OT LONG TERM GOAL #2   Title Pt will complete UB and LB dressing with modified independence including buttons, snaps and zippers.    Baseline requires min assist at eval, 10th visit: occasional assist with buttons, 20th:  able to perform one handed, but difficulty with bilateral UE; 08/17/21: pt reports inconsistent with 1 hand, reviewed techniques this visit, Visit 40:  Pt requires assist with bra   Time 12    Period Weeks    Status MET   Target Date 11/08/21     OT LONG TERM GOAL #3   Title Pt will perform shower transfer with modified independence.    Baseline Pt requires supervision to min assist for shower transfer at home. 10th visit: supervision; 08/17/21: supv .  Visit 40:  Pt had met goal but had recent fall and now requiring min guard to supv   Time 6    Period Weeks    Status  Met    Target Date 11/08/21      OT LONG TERM GOAL #4   Title Pt will improve L hand grip by 20# to assist with holding items in left hand securely (revised from 10#)   Baseline no grip in left hand at eval, 10th visit:  improved flexion but still working towards composite fisting and grip. 20th:  continues to demo decreased grip; 08/17/21: active digit flexion improving, but not yet able to register grip on dynamometer; 09/04/21: L grip 1# 7/17:  5#; 12/25/21: L grip 6#; 01/31/22: L grip 13#   Time 12    Period Weeks    Status Met 10# goal, revised to 20# increase   Target Date 04/25/22     OT LONG TERM GOAL #5   Title Pt will improve left shoulder flexion to 120* degrees or better to improve reaching to obtain self care items from shelf above shoulder height.    Baseline difficulty with  reach, shoulder flexion to 47 degrees; 08/17/21: L shoulder flexion 85, but not yet able to consistently hold ADL supplies in L hand when reaching; 09/04/21: flexion 85 7/17:  shoulder flexion to 90; 12/25/21: 105 (P 125); 01/31/22: active 0-108   Time 12    Period Weeks    Status Met 100*, revised to 120*   Target Date 04/25/22      OT LONG TERM GOAL #6   Title Pt will improve FOTO score to 47 or above to demonstrate a clinically relevant change in function to impact ADL tasks.    Baseline score of 30 at eval; 08/17/21: FOTO:  42; 09/04/21: FOTO : 54 , FOTO 7/7:  60; 12/25/21: 59; 01/31/22: FOTO 61   Time 12    Period Weeks    Status  MET/ongoing    Target Date 04/25/22      OT LONG TERM GOAL #7   Title Pt will demonstrate ability to pick up small objects and complete 9 hole peg test in less than 1 min (revised)   Baseline unable to perform at eval, 10th visit: still unable to pick up small pegs, 20th: unable to pick up pegs but improving; 08/17/21: not attempted d/t time constraints, will assess next visit; 09/04/21: 9 hole peg test in 5 min 53 sec;  7/17:  Pt unable to complete this date but was able to place 6 of 9 pegs in 4 mins 20 secs; 12/25/21: L 1 min 43 sec; 01/31/22: L 1 min 8 sec    Time 12    Period Weeks    Status Revised/On-going    Target Date 04/25/22             Plan -     Clinical Impression Statement Pt making steady gains towards all OT goals.  Pt's FOTO score has improved from 30 at initial eval to 61 at recert.  Pt's LUE strength and coordination continue to improve.  Pt is now using her LUE to blow dry her hair, and pt has just started using her L hand to use her curling iron with caution.  Pt is getting back into crocheting, though this task is tedious for her.  Pt continues to work towards increasing L grip and dexterity skills for better control of holding and manipulating ADL supplies with better efficiency and decreased risk of dropping.  L shoulder remains stiff, limiting  overhead reach for ADLs.  Pt consistently reaches at shoulder level, but fatigues quickly with repetitions from 5-10.  Pt will continue to work towards goals in plan of care to improve left UE function and  increase active normal movement patterns for daily tasks.    OT Occupational Profile and History Detailed Assessment- Review of Records and additional review of physical, cognitive, psychosocial history related to current functional performance    Occupational performance deficits (Please refer to evaluation for details): ADL's;IADL's;Leisure;Rest and Sleep    Body Structure / Function / Physical Skills ADL;Coordination;Endurance;GMC;UE functional use;Balance;IADL;Pain;Dexterity;FMC;Strength;Edema;Mobility;ROM    Psychosocial Skills Environmental  Adaptations;Habits;Routines and Behaviors    Rehab Potential Good    Clinical Decision Making Several treatment options, min-mod task modification necessary   Comorbidities Affecting Occupational Performance: May have comorbidities impacting occupational performance    Modification or Assistance to Complete Evaluation  Min-Moderate modification of tasks or assist with assess necessary to complete eval    OT Frequency 2x / week    OT Duration 12 weeks    OT Treatment/Interventions Self-care/ADL training;Cryotherapy;Paraffin;Therapeutic exercise;DME and/or AE instruction;Functional Mobility Training;Balance training;Electrical Stimulation;Ultrasound;Neuromuscular education;Manual Therapy;Splinting;Moist Heat;Contrast Bath;Passive range of motion;Therapeutic activities;Patient/family education;Coping strategies training    Plan Neuro re-ed, therapeutic exercises, ADLs   Consulted and Agree with Plan of Care Patient            Leta Speller, MS, OTR/L  Darleene Cleaver, OT 02/02/2022, 8:14 PM

## 2022-02-05 ENCOUNTER — Ambulatory Visit: Payer: Medicare PPO

## 2022-02-05 ENCOUNTER — Other Ambulatory Visit: Payer: Self-pay

## 2022-02-05 DIAGNOSIS — R278 Other lack of coordination: Secondary | ICD-10-CM

## 2022-02-05 DIAGNOSIS — R2689 Other abnormalities of gait and mobility: Secondary | ICD-10-CM

## 2022-02-05 DIAGNOSIS — M6281 Muscle weakness (generalized): Secondary | ICD-10-CM | POA: Diagnosis not present

## 2022-02-05 DIAGNOSIS — I63511 Cerebral infarction due to unspecified occlusion or stenosis of right middle cerebral artery: Secondary | ICD-10-CM

## 2022-02-05 DIAGNOSIS — N393 Stress incontinence (female) (male): Secondary | ICD-10-CM

## 2022-02-05 NOTE — Therapy (Signed)
OUTPATIENT OCCUPATIONAL THERAPY NEURO TREATMENT NOTE Patient Name: Teresa [name] MRN: [id number] DOB:1941-02-24, 81 y.o., female Today's Date: 12/13/2021  PCP: Dr. [name] REFERRING PROVIDER: Dr. [name]     OT End of Session - 02/05/22 2324     Visit Number 60    Number of Visits 94    Date for OT Re-Evaluation 04/25/22    Authorization Time Period Reporting period beginning 01/31/22    OT Start Time 1100    OT Stop Time 1145    OT Time Calculation (min) 45 min    Activity Tolerance Patient tolerated treatment well    Behavior During Therapy [facility] for tasks assessed/performed             Past Medical History:  Diagnosis Date   Cancer ([place])    skin   Hypothyroidism    Raynaud's disease    Stroke [facility])    Past Surgical History:  Procedure Laterality Date   [place]  2010   Cervical fusion C4-5-6-7   CATARACT EXTRACTION, BILATERAL Bilateral 10/2016   CHOLECYSTECTOMY  1995   COLONOSCOPY WITH PROPOFOL N/A 12/08/2018   Procedure: COLONOSCOPY WITH PROPOFOL;  Surgeon: Toledo, Benay Pike, MD;  Location: [facility] ENDOSCOPY;  Service: Gastroenterology;  Laterality: N/A;   EYE SURGERY     Patient Active Problem List   Diagnosis Date Noted   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   Stroke ([place]) 02/27/2021   History of nonmelanoma skin cancer 05/23/2014    ONSET DATE: 02/26/2021  REFERRING DIAG: R MCA CVA  THERAPY DIAG:  Muscle weakness (generalized)  Other lack of coordination  Right middle cerebral artery stroke [facility])  Rationale for Evaluation and Treatment Rehabilitation  PERTINENT HISTORY: February 26, 2021, pt reports she had a CVA, came to [facility] to ER and then was transferred to [facility] in [place] where she was admitted and after acute care she went to inpatient rehab.  Following inpt rehab, pt went home and had home health.   PRECAUTIONS: fall  SUBJECTIVE:  Pt  brought in her recent crochet work.  Pt reports it is still tedious but improving.  PAIN:  Are you having pain? no   OBJECTIVE:  L grip 6, R grip 41 L lateral pinch 5, R 13 L 3 point pinch 4, R 14  L 9 hole 1 min 43 sec. L shoulder active flexion 0-105, passive 0-125 02/05/22: L grip 13#  L lateral pinch 8#  3 point pinch 5#  L shoulder flex 0-108  TODAY'S TREATMENT: Moist heat for muscle relaxation and tissue mobility alternated between R/L shoulders throughout session with simultaneous completion of neuro re-ed activities.   Neuro re-ed:  Connect 4:  Pt sorted red and black game pieces with L hand.  Practiced picking up chips from table top without non-skid surface; extra time.  Able to place  Chips into game slots with minimal shoulder hike.  Pt increasing ability to isolate distal and proximal movements throughout the LUE.     PATIENT EDUCATION: Education details: LUE strengthening Person educated: pt Education method: Explanation and Verbal cues Education comprehension: verbalized understanding   HOME EXERCISE PROGRAM Continue to engage LUE into ADLs; continue gripping and pinching exercises for LUE, and L shoulder AROM/AAROM, crocheting, typing; increase participation in IADL tasks for greater use of L hand.    OT Long Term Goals -       OT LONG TERM  GOAL #1   Title Pt will be independent with home exercise program.    Baseline Eval: no current program, 10th visit:  continue to add new exercises as pt progresses, 20th:  continue to update HEP; 08/17/21: continue to progress HEP when indicated.  Visit 40: adding new exercises as pt progresses; 12/25/21: ongoing with progressions   Time 12    Period Weeks    Status On-going    Target Date 01/31/22      OT LONG TERM GOAL #2   Title Pt will complete UB and LB dressing with modified independence including buttons, snaps and zippers.    Baseline requires min assist at eval, 10th visit: occasional assist with buttons,  20th:  able to perform one handed, but difficulty with bilateral UE; 08/17/21: pt reports inconsistent with 1 hand, reviewed techniques this visit, Visit 40:  Pt requires assist with bra   Time 12    Period Weeks    Status MET   Target Date 11/08/21     OT LONG TERM GOAL #3   Title Pt will perform shower transfer with modified independence.    Baseline Pt requires supervision to min assist for shower transfer at home. 10th visit: supervision; 08/17/21: supv .  Visit 40:  Pt had met goal but had recent fall and now requiring min guard to supv   Time 6    Period Weeks    Status  Met    Target Date 11/08/21      OT LONG TERM GOAL #4   Title Pt will improve L hand grip by 10# to assist with holding items in left hand securely.    Baseline no grip in left hand at eval, 10th visit:  improved flexion but still working towards composite fisting and grip. 20th:  continues to demo decreased grip; 08/17/21: active digit flexion improving, but not yet able to register grip on dynamometer; 09/04/21: L grip 1# 7/17:  5#; 12/25/21: L grip 6#; 02/05/22: 13#   Time 12    Period Weeks    Status On-going    Target Date 01/31/22      OT LONG TERM GOAL #5   Title Pt will improve left shoulder flexion to 100 degrees or better to improve reaching to obtain self care items from shelf/shoulder height.    Baseline difficulty with reach, shoulder flexion to 47 degrees; 08/17/21: L shoulder flexion 85, but not yet able to consistently hold ADL supplies in L hand when reaching; 09/04/21: flexion 85 7/17:  shoulder flexion to 90; 12/25/21: 105 (P 125)    Time 12    Period Weeks    Status On-going    Target Date 01/31/22      OT LONG TERM GOAL #6   Title Pt will improve FOTO score to 47 or above to demonstrate a clinically relevant change in function to impact ADL tasks.    Baseline score of 30 at eval; 08/17/21: FOTO: 42; 09/04/21: FOTO : 54 , FOTO 7/7:  60; 12/25/21: 59   Time 12    Period Weeks    Status  MET/ongoing     Target Date 01/31/22      OT LONG TERM GOAL #7   Title Pt will demonstrate ability to pick up small objects and complete 9 hole peg test in less than 1 min 30 sec (revised from under 2 min)   Baseline unable to perform at eval, 10th visit: still unable to pick up small pegs, 20th: unable to  pick up pegs but improving; 08/17/21: not attempted d/t time constraints, will assess next visit; 09/04/21: 9 hole peg test in 5 min 53 sec;  7/17:  Pt unable to complete this date but was able to place 6 of 9 pegs in 4 mins 20 secs; 12/25/21: L 1 min 43 sec; 02/05/22:    Time 12    Period Weeks    Status Revised/On-going    Target Date 01/31/2022             Plan -     Clinical Impression Statement Facilitated LUE [facility] skills with Connect Four game; pt sorted red and black game pieces with L hand.  Practiced picking up chips from table top without non-skid surface; extra time.  Able to place  chips into game slots with minimal shoulder hike.  Pt increasing ability to isolate distal and proximal movements throughout the LUE.  Pt reports crocheting is still quite tedious, but improved from her last attempt, and she will continue to work on this at home.  Pt will continue to work towards goals in plan of care to improve left UE function, decrease pain and increase active normal movement patterns for daily tasks.    OT Occupational Profile and History Detailed Assessment- Review of Records and additional review of physical, cognitive, psychosocial history related to current functional performance    Occupational performance deficits (Please refer to evaluation for details): ADL's;IADL's;Leisure;Rest and Sleep    Body Structure / Function / Physical Skills ADL;Coordination;Endurance;GMC;UE functional use;Balance;IADL;Pain;Dexterity;FMC;Strength;Edema;Mobility;ROM    Psychosocial Skills Environmental  Adaptations;Habits;Routines and Behaviors    Rehab Potential Good    Clinical Decision Making Several  treatment options, min-mod task modification necessary   Comorbidities Affecting Occupational Performance: May have comorbidities impacting occupational performance    Modification or Assistance to Complete Evaluation  Min-Moderate modification of tasks or assist with assess necessary to complete eval    OT Frequency 2x / week    OT Duration 12 weeks    OT Treatment/Interventions Self-care/ADL training;Cryotherapy;Paraffin;Therapeutic exercise;DME and/or AE instruction;Functional Mobility Training;Balance training;Electrical Stimulation;Ultrasound;Neuromuscular education;Manual Therapy;Splinting;Moist Heat;Contrast Bath;Passive range of motion;Therapeutic activities;Patient/family education;Coping strategies training    Plan OT recert    Consulted and Agree with Plan of Care Patient            Leta Speller, MS, OTR/L  Darleene Cleaver, OT 02/05/2022, 11:26 PM

## 2022-02-05 NOTE — Addendum Note (Signed)
Addended by: Darleene Cleaver on: 02/05/2022 05:29 PM   Modules accepted: Orders

## 2022-02-05 NOTE — Addendum Note (Signed)
Addended by: Darleene Cleaver on: 02/05/2022 05:24 PM   Modules accepted: Orders

## 2022-02-05 NOTE — Patient Instructions (Signed)
TOILET POSTURE: Urination: feet flat, lean forward with forearms on legs to fully empty bladder. Bowel movement: place feet flat on Squatty Potty or stool so knees are higher than hips, lean forward to relax pelvic floor in order to avoid strain.   WATER: start with a cup of water in the morning, and then coffee after water.

## 2022-02-05 NOTE — Therapy (Signed)
OUTPATIENT PHYSICAL THERAPY FEMALE PELVIC EVALUATION   Patient Name: Teresa [name] MRN: [id number] DOB:1940/10/08, 81 y.o., female Today's Date: 02/05/2022   PT End of Session - 02/05/22 1021     Visit Number 1    Number of Visits 9    Date for PT Re-Evaluation 04/06/22    Authorization Type [employer] PPO-    Progress Note Due on Visit 10    PT Start Time 1015    PT Stop Time 1100    PT Time Calculation (min) 45 min    Activity Tolerance Patient tolerated treatment well;No increased pain    Behavior During Therapy WFL for tasks assessed/performed             Past Medical History:  Diagnosis Date   Cancer ([place])    skin   Hypothyroidism    Raynaud's disease    Stroke [facility])    Past Surgical History:  Procedure Laterality Date   [place]   BACK SURGERY  2010   Cervical fusion C4-5-6-7   CATARACT EXTRACTION, BILATERAL Bilateral 10/2016   CHOLECYSTECTOMY  1995   COLONOSCOPY WITH PROPOFOL N/A 12/08/2018   Procedure: COLONOSCOPY WITH PROPOFOL;  Surgeon: Toledo, Benay Pike, MD;  Location: [facility] ENDOSCOPY;  Service: Gastroenterology;  Laterality: N/A;   EYE SURGERY     Patient Active Problem List   Diagnosis Date Noted   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   Stroke ([place]) 02/27/2021   History of nonmelanoma skin cancer 05/23/2014    PCP: [name], MD  REFERRING PROVIDER: [name], MD  REFERRING DIAG: SUI, s/p hemiplegia R CVA in 02/2021  THERAPY DIAG:  SUI (stress urinary incontinence, female)  Muscle weakness (generalized)  Other lack of coordination  Other abnormalities of gait and mobility  Rationale for Evaluation and Treatment: Rehabilitation  ONSET DATE: 01/09/22 referral date  SUBJECTIVE:                                                                                                                                                                                            SUBJECTIVE STATEMENT: Pt reported SUI began having Sept/Oct of 2023, and Dr. Doy Hutching put her on Oxybutin but made her stop going to the bathroom and she felt pressure. She is now on Vesicare and it has helped, only one accident 2/2 urgency. She does not want to wear a pad 2/2 leakage. Urine: pt voids once in four hours, but sits on toilet 5-6 minutes before she starts. Gets up once a night to void. Urine flow is strong. Pt denied pain with urination.  Pt denied leakage with coughing, sneezing, lifting but does leak with urgency. Bowel: pt has a bowel movement every three days. Pt denied leakage. Pt takes a stool softner. Pt denied pain. Core stability: hx of hysterectomy with abdominal incision and gall bladder surgery (laparoscopy). Sexual function: not currently sexually active since CVA but would like to be active. Pt denied hx of pain with intercourse or OBGYN exam.  Pt used to walk three miles/day and now walks one mile every other day.  Pt has hx of herniated disc approx. 20 years ago.   Fluid intake: Yes: pt starts the day with two cups of coffee (caffeine), and then water (ice water). Pt does not drink tea or soda. Pt has wine with neighbor.     PAIN:  Are you having pain? No NPRS scale: 0/10 Pain location:   Pain type: n/a Pain description:  Aggravating factors:  Relieving factors:   PRECAUTIONS: Fall  WEIGHT BEARING RESTRICTIONS: No  FALLS:  Has patient fallen in last 6 months? No but broke ankle from falling earlier this year.  LIVING ENVIRONMENT: Lives with: lives with their family and lives with their spouse Lives in: House/apartment pt only goes upstairs to husband's man cave to get holiday decorations.  Stairs: Yes: Internal: 12 steps; on right going up and External: 1 steps; none Has following equipment at home: Quad cane small base  OCCUPATION: retired.  PLOF: Independent prior to CVA.   PATIENT GOALS: Be able to void right away and not have to wait 5-10 minutes  before urine flow begins. Stop leaking with urgency.   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 Raynaud's disease, hysterectomy, cholecystectomy, appendectomy, B cataract excision, cervical fusion in 2010 Sexual abuse: No  BOWEL MOVEMENT: Pain with bowel movement: No Type of bowel movement: Fully empty rectum: Yes:   Leakage: No Pads: No Fiber supplement: No  URINATION: Pain with urination: No Fully empty bladder: No sometimes yes, sometimes no. Stream: Strong Urgency: Yes:   Frequency: once every four hours. Leakage: Urge to void Pads: No  INTERCOURSE: Pain with intercourse:  not currently sexually active.  Ability to have vaginal penetration:  no hx of pain Climax:  Marinoff Scale:  PREGNANCY: Vaginal deliveries 3 Tearing Yes: had an episiotomy  C-section deliveries 0 Currently pregnant No  PROLAPSE: None hx of hysterectomy   OBJECTIVE:   DIAGNOSTIC FINDINGS:  N/a  PATIENT SURVEYS:  N/a    COGNITION: Overall cognitive status: Within functional limits for tasks assessed     SENSATION: Light touch: Appears intact except for L side of face and L hand (currently going to OT).  Proprioception: Appears intact  MUSCLE LENGTH:   GAIT: Distance walked: 65' Assistive device utilized: Lobbyist (hurrycane) Level of assistance: Modified independence Comments: decr. Stride length, decr. speed  POSTURE: forward head, decr. Tx kyphosis, decr. Lx lordosis, incr. Cx lordosis  PELVIC ALIGNMENT: R side slightly elevated.  LUMBARAROM/PROM: all performed with S to ensure safety 2/2 impaired balance.  A/PROM A/PROM  eval  Flexion WNL  Extension Limited hx of back/neck sx  Right lateral flexion WNL  Left lateral flexion WNL  Right rotation limited  Left rotation limited   (Blank rows = not tested)  LOWER EXTREMITY ROM:  Active ROM Right eval Left eval  Hip flexion    Hip extension    Hip  abduction    Hip adduction    Hip internal rotation  Hip external rotation    Knee flexion    Knee extension    Ankle dorsiflexion    Ankle plantarflexion    Ankle inversion    Ankle eversion     (Blank rows = not tested)  LOWER EXTREMITY MMT:  MMT Right eval Left eval  Hip flexion    Hip extension    Hip abduction    Hip adduction    Hip internal rotation    Hip external rotation    Knee flexion    Knee extension    Ankle dorsiflexion    Ankle plantarflexion    Ankle inversion    Ankle eversion     PALPATION:   General  no TTP in standing with PT palpating spine.                External Perineal Exam n/a                             Internal Pelvic Floor n/a   PELVIC MMT:   MMT eval  Vaginal   Internal Anal Sphincter   External Anal Sphincter   Puborectalis   Diastasis Recti   (Blank rows = not tested)        TONE: N/a  PROLAPSE: No hx of prolapse.  TODAY'S TREATMENT:                                                                                                                              DATE: 02/05/22  EVAL    PATIENT EDUCATION:  Education details: PT discussed frequency, duration and POC. PT educated pt on proper posture and how it can impact PFM. PT educated pt on proper toileting posture to fully empty bladder and decr. Strain during bowel movement.  Person educated: Patient Education method: Explanation, Demonstration, Verbal cues, and Handouts Education comprehension: verbalized understanding, returned demonstration, and needs further education  HOME EXERCISE PROGRAM: Not yet established.  ASSESSMENT:  CLINICAL IMPRESSION: Patient is a pleasant 81 y.o. female who was seen today for physical therapy evaluation and treatment for urinary incontinence s/p R CVA in 02/2021. 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 Raynaud's disease, hysterectomy, cholecystectomy,  appendectomy, B cataract excision, cervical fusion in 2010. The following impairments were noted upon exam: urgency urinary incontinence, decr. ROM, impaired strength based on gait deviations-will formally assess next session-limited 2/2 time constraints, gait deviations, impaired balance, impaired posture, decr. Flexibility. Pt would benefit from skilled PT to improve safety during all functional mobility.  OBJECTIVE IMPAIRMENTS: Abnormal gait, decreased activity tolerance, decreased balance, decreased coordination, decreased endurance, decreased knowledge of use of DME, decreased mobility, decreased ROM, decreased strength, hypomobility, impaired flexibility, impaired sensation, impaired UE functional use, and postural dysfunction.   ACTIVITY LIMITATIONS: carrying, lifting, bending, standing, squatting, sleeping, transfers, bed mobility, continence, toileting, and locomotion level  PARTICIPATION LIMITATIONS:  meal prep, cleaning, laundry, interpersonal relationship, driving, shopping, and community activity  PERSONAL FACTORS: Age, Time since onset of injury/illness/exacerbation, and 3+ comorbidities: see above.  are also affecting patient's functional outcome.   REHAB POTENTIAL: Good  CLINICAL DECISION MAKING: Evolving/moderate complexity hx of CVA.  EVALUATION COMPLEXITY: Moderate   GOALS: Goals reviewed with patient? Yes  SHORT TERM GOALS: Target date: 03/05/2022  Pt will be IND in initial HEP to improve strength, coordination and flexility. Baseline: not established Goal status: INITIAL  2.  Pt will demonstrate proper toileting posture IND in order to fully empty bladder and reduce strain during bowel movement. Baseline: not performing Goal status: INITIAL  3.  Perform FOTO and write goals as indicated. Baseline:  not performed Goal status: INITIAL  4.  Complete spine, pelvic floor muscle (PFM) exam and write goals as indicated. Baseline:  Goal status: INITIAL    LONG TERM  GOALS: Target date: 04/02/2022   Pt will report no leakage over the last 4 weeks to improve QOL and improve safety during all ADLs. Baseline: urgency incontinence last week Goal status: INITIAL  2.  Pt will improve coordination of PFM and incr. Hip/LE strength in order to reduce urgency incontinece. Baseline: difficulty coordinating PFM and hip/LE weakness. Goal status: INITIAL  3.  Pt will demonstrate proper posture in seated and standing and walking in order to improve coordination of PFM during all activities to reduce leakage and improve safety during functional mobility. Baseline: forward head, decr. Tx kyphosis, decr. Lx lordosis, incr. Cx lordosis, pelvic obliquity Goal status: INITIAL  PLAN:  PT FREQUENCY: 1x/week  PT DURATION: 8 weeks  PLANNED INTERVENTIONS: Therapeutic exercises, Therapeutic activity, Neuromuscular re-education, Balance training, Gait training, Patient/Family education, Self Care, Joint mobilization, DME instructions, Dry Needling, Spinal mobilization, scar mobilization, Biofeedback, Manual therapy, and Re-evaluation  PLAN FOR NEXT SESSION: finish exam, initiate HEP and review posture/water intake.   [place], PT 02/05/2022, 10:22 AM  Geoffry Paradise, PT,DPT 02/05/22 10:22 AM Phone: [phone] Fax: [phone]

## 2022-02-07 ENCOUNTER — Ambulatory Visit: Payer: Medicare PPO

## 2022-02-07 DIAGNOSIS — I63511 Cerebral infarction due to unspecified occlusion or stenosis of right middle cerebral artery: Secondary | ICD-10-CM

## 2022-02-07 DIAGNOSIS — M6281 Muscle weakness (generalized): Secondary | ICD-10-CM | POA: Diagnosis not present

## 2022-02-07 DIAGNOSIS — R278 Other lack of coordination: Secondary | ICD-10-CM

## 2022-02-09 NOTE — Therapy (Signed)
OUTPATIENT OCCUPATIONAL THERAPY NEURO TREATMENT NOTE Patient Name: Teresa [name] MRN: [id number] DOB:1940-09-09, 81 y.o., female Today's Date: 12/13/2021  PCP: Dr. [name] REFERRING PROVIDER: Dr. [name]     OT End of Session - 02/09/22 2042     Visit Number 72    Number of Visits 94    Date for OT Re-Evaluation 04/25/22    Authorization Time Period Reporting period beginning 01/31/22    OT Start Time 1515    OT Stop Time 1600    OT Time Calculation (min) 45 min    Activity Tolerance Patient tolerated treatment well    Behavior During Therapy [facility] for tasks assessed/performed             Past Medical History:  Diagnosis Date   Cancer ([place])    skin   Hypothyroidism    Raynaud's disease    Stroke [facility])    Past Surgical History:  Procedure Laterality Date   [place]  2010   Cervical fusion C4-5-6-7   CATARACT EXTRACTION, BILATERAL Bilateral 10/2016   CHOLECYSTECTOMY  1995   COLONOSCOPY WITH PROPOFOL N/A 12/08/2018   Procedure: COLONOSCOPY WITH PROPOFOL;  Surgeon: Toledo, Benay Pike, MD;  Location: [facility] ENDOSCOPY;  Service: Gastroenterology;  Laterality: N/A;   EYE SURGERY     Patient Active Problem List   Diagnosis Date Noted   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  Following inpt rehab, pt went home and had home health.   PRECAUTIONS: fall  SUBJECTIVE:  Pt  reports doing well today.  Pt states the strategies from her pelvic floor PT visit have been really effective.   PAIN:  Are you having pain? no   OBJECTIVE:  L grip 6, R grip 41 L lateral pinch 5, R 13 L 3 point pinch 4, R 14  L 9 hole 1 min 43 sec. L shoulder active flexion 0-105, passive 0-125 02/05/22: L grip 13#  L lateral pinch 8#  3 point pinch 5#  L shoulder flex 0-108  TODAY'S TREATMENT: Moist heat for muscle relaxation and tissue mobility alternated between R/L shoulders throughout session with simultaneous completion of neuro re-ed activities.  Therapeutic Exercise: Completed shoulder pulleys in standing to facilitate increasing L shoulder flexion and abduction.  OT adjusted length of pulleys between flexion and abduction exercises.  Pt required mod A for maintaining proper body mechanics to reduce compensatory movement strategies.  Plan to continue in the clinic with assist before having pt try these at home to avoid injury.  Facilitated L grip strengthening with hand gripper set at min resistance with 1 red band to complete 3 sets 10 reps.  Rest between sets and cues for technique to maximize hand closure with each squeeze.    Neuro re-ed: Practiced scooping, storage, and translatory skills in L hand using [place] stones.  Pt able to pick up 3 stones at a time.  Cues for tighter grip and maximizing forearm supination during storage in order to reduce dropped stones from ulnar aspect of hand.     PATIENT EDUCATION: Education details: LUE strengthening Person educated: pt Education method: Explanation and Verbal cues Education comprehension: verbalized understanding   HOME EXERCISE PROGRAM Continue to engage LUE into ADLs; continue gripping and pinching exercises for LUE, and L shoulder AROM/AAROM, crocheting, typing; increase participation in IADL tasks for greater use of L hand.    OT Long Term Goals -       OT LONG TERM GOAL #1   Title Pt will be independent  with home exercise program.    Baseline Eval: no current program, 10th visit:  continue to add new exercises as pt progresses, 20th:  continue to update HEP; 08/17/21: continue to progress HEP when indicated.  Visit 40: adding new exercises as pt progresses; 12/25/21: ongoing with progressions   Time 12    Period Weeks    Status On-going    Target Date 01/31/22      OT LONG TERM GOAL #2   Title Pt will complete UB and LB dressing with modified independence including buttons, snaps and zippers.    Baseline requires min assist at eval, 10th visit: occasional assist with buttons, 20th:  able to perform one handed, but difficulty with bilateral UE; 08/17/21: pt reports inconsistent with 1 hand, reviewed techniques this visit, Visit 40:  Pt requires assist with bra   Time 12    Period Weeks    Status MET   Target Date 11/08/21     OT LONG TERM GOAL #3   Title Pt will perform shower transfer with modified independence.    Baseline Pt requires supervision to min assist for shower transfer at home. 10th visit: supervision; 08/17/21: supv .  Visit 40:  Pt had met goal but had recent fall and now requiring min guard to supv   Time 6    Period Weeks    Status  Met    Target Date 11/08/21      OT LONG TERM GOAL #4   Title Pt will improve L hand grip by 10# to assist with holding items in left hand securely.    Baseline no grip in left hand at eval, 10th visit:  improved flexion but still working towards composite fisting and grip. 20th:  continues to demo decreased grip; 08/17/21: active digit flexion improving, but not yet able to register grip on dynamometer; 09/04/21: L grip 1# 7/17:  5#; 12/25/21: L grip 6#; 02/05/22: 13#   Time 12    Period Weeks    Status On-going    Target Date 01/31/22      OT LONG TERM GOAL #5   Title Pt will improve left shoulder flexion to 100 degrees or better to improve reaching to obtain self care items from shelf/shoulder height.    Baseline difficulty with reach,  shoulder flexion to 47 degrees; 08/17/21: L shoulder flexion 85, but not yet able to consistently hold ADL supplies in L hand when reaching; 09/04/21: flexion 85 7/17:  shoulder flexion to 90; 12/25/21: 105 (P 125)    Time 12    Period Weeks    Status On-going    Target Date 01/31/22      OT LONG TERM GOAL #6   Title Pt will improve FOTO score to 47 or above to demonstrate a clinically relevant change in function to impact ADL tasks.    Baseline score of 30 at  eval; 08/17/21: FOTO: 42; 09/04/21: FOTO : 54 , FOTO 7/7:  60; 12/25/21: 59   Time 12    Period Weeks    Status  MET/ongoing    Target Date 01/31/22      OT LONG TERM GOAL #7   Title Pt will demonstrate ability to pick up small objects and complete 9 hole peg test in less than 1 min 30 sec (revised from under 2 min)   Baseline unable to perform at eval, 10th visit: still unable to pick up small pegs, 20th: unable to pick up pegs but improving; 08/17/21: not attempted d/t time constraints, will assess next visit; 09/04/21: 9 hole peg test in 5 min 53 sec;  7/17:  Pt unable to complete this date but was able to place 6 of 9 pegs in 4 mins 20 secs; 12/25/21: L 1 min 43 sec; 02/05/22:    Time 12    Period Weeks    Status Revised/On-going    Target Date 01/31/2022             Plan -     Clinical Impression Statement Initiated pulley exercises today for increasing L shoulder flex/abd.  Pt required mod A for maintaining proper body mechanics to reduce compensatory movement strategies.  Plan to continue in the clinic with assist before having pt try these at home to avoid injury.  Practiced scooping, storage, and translatory skills in L hand using [place] stones.  Pt able to pick up 3 stones at a time.  Cues for tighter grip and maximizing forearm supination during storage in order to reduce dropped stones from ulnar aspect of hand.  Pt still working to develop translatory skills, using thumb to move stones from palm to fingertips, which is  difficult.  Pt tends to shake her forearm and hand to move stones.  Pt will continue to work towards goals in plan of care to improve left UE function, decrease pain and increase active normal movement patterns for daily tasks.    OT Occupational Profile and History Detailed Assessment- Review of Records and additional review of physical, cognitive, psychosocial history related to current functional performance    Occupational performance deficits (Please refer to evaluation for details): ADL's;IADL's;Leisure;Rest and Sleep    Body Structure / Function / Physical Skills ADL;Coordination;Endurance;GMC;UE functional use;Balance;IADL;Pain;Dexterity;FMC;Strength;Edema;Mobility;ROM    Psychosocial Skills Environmental  Adaptations;Habits;Routines and Behaviors    Rehab Potential Good    Clinical Decision Making Several treatment options, min-mod task modification necessary   Comorbidities Affecting Occupational Performance: May have comorbidities impacting occupational performance    Modification or Assistance to Complete Evaluation  Min-Moderate modification of tasks or assist with assess necessary to complete eval    OT Frequency 2x / week    OT Duration 12 weeks    OT Treatment/Interventions Self-care/ADL training;Cryotherapy;Paraffin;Therapeutic exercise;DME and/or AE instruction;Functional Mobility Training;Balance training;Electrical Stimulation;Ultrasound;Neuromuscular education;Manual Therapy;Splinting;Moist Heat;Contrast Bath;Passive range of motion;Therapeutic activities;Patient/family education;Coping strategies training    Plan OT recert    Consulted and Agree with Plan of Care Patient            Leta Speller, MS, OTR/L  Darleene Cleaver, OT 02/09/2022, 8:44 PM

## 2022-02-11 ENCOUNTER — Ambulatory Visit (INDEPENDENT_AMBULATORY_CARE_PROVIDER_SITE_OTHER): Payer: Medicare PPO

## 2022-02-11 DIAGNOSIS — I639 Cerebral infarction, unspecified: Secondary | ICD-10-CM

## 2022-02-12 ENCOUNTER — Ambulatory Visit: Payer: Medicare PPO

## 2022-02-12 DIAGNOSIS — M6281 Muscle weakness (generalized): Secondary | ICD-10-CM

## 2022-02-12 DIAGNOSIS — R278 Other lack of coordination: Secondary | ICD-10-CM

## 2022-02-12 DIAGNOSIS — I63511 Cerebral infarction due to unspecified occlusion or stenosis of right middle cerebral artery: Secondary | ICD-10-CM

## 2022-02-12 LAB — CUP PACEART REMOTE DEVICE CHECK
Date Time Interrogation Session: 20231119231152
Implantable Pulse Generator Implant Date: 19440924

## 2022-02-15 NOTE — Therapy (Signed)
OUTPATIENT OCCUPATIONAL THERAPY NEURO TREATMENT NOTE Patient Name: Teresa [name] MRN: [id number] DOB:Aug 02, 1940, 81 y.o., female Today's Date: 12/13/2021  PCP: Dr. [name] REFERRING PROVIDER: Dr. [name]     OT End of Session - 02/15/22 1154     Visit Number [id number]    Number of Visits 94    Date for OT Re-Evaluation 04/25/22    Authorization Time Period Reporting period beginning 01/31/22    OT Start Time 1145    OT Stop Time 1230    OT Time Calculation (min) 45 min    Activity Tolerance Patient tolerated treatment well    Behavior During Therapy [facility] for tasks assessed/performed             Past Medical History:  Diagnosis Date   Cancer ([place])    skin   Hypothyroidism    Raynaud's disease    Stroke [facility])    Past Surgical History:  Procedure Laterality Date   [place]  2010   Cervical fusion C4-5-6-7   CATARACT EXTRACTION, BILATERAL Bilateral 10/2016   CHOLECYSTECTOMY  1995   COLONOSCOPY WITH PROPOFOL N/A 12/08/2018   Procedure: COLONOSCOPY WITH PROPOFOL;  Surgeon: Toledo, Benay Pike, MD;  Location: [facility] ENDOSCOPY;  Service: Gastroenterology;  Laterality: N/A;   EYE SURGERY     Patient Active Problem List   Diagnosis Date Noted   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  Following inpt rehab, pt went home and had home health.   PRECAUTIONS: fall  SUBJECTIVE:  Pt  reported that she's been busy making pies this morning in prep for the holiday.    PAIN:  Are you having pain? no   OBJECTIVE:  L grip 6, R grip 41 L lateral pinch 5, R 13 L 3 point pinch 4, R 14  L 9 hole 1 min 43 sec. L shoulder active flexion 0-105, passive 0-125 02/05/22: L grip 13#  L lateral pinch 8#  3 point pinch 5#  L shoulder flex 0-108  TODAY'S TREATMENT: Moist heat for muscle relaxation and tissue mobility alternated between R/L shoulders throughout session with simultaneous completion of neuro re-ed and therapeutic exercises.  Therapeutic Exercise: Facilitated L grip strengthening with hand gripper set at min resistance with 1 red band to complete 3 sets 10 reps.  Rest between sets and cues for technique to maximize hand closure with each squeeze. Performed passive stretching for the L shoulder flexion, abd, ER, horiz add, forearm pron/sup for increasing flexibility to engage the LUE into daily tasks.  Performed 1# dumbbell strengthening exercises for L wrist flex, ext, RD/UD, forearm pron/sup, elbow flex, L shoulder ER (min guard), and AAROM for the L shoulder flex, abd x2 sets 10 reps each.     Neuro re-ed: Practiced L hand [facility] and dexterity skills working to place and remove small pegs from pegboard.  Pt practiced scooping small glass stones into palm and  translatory skills, working to move stones from palm to fingertips in prep to discard.     PATIENT EDUCATION: Education details: LUE strengthening Person educated: pt Education method: Explanation and Verbal cues Education comprehension: verbalized understanding   HOME EXERCISE PROGRAM Continue to engage LUE into ADLs; continue gripping and pinching exercises for LUE, and L shoulder AROM/AAROM, crocheting, typing; increase participation in IADL tasks for greater use of L hand.    OT Long Term Goals -       OT LONG TERM GOAL #1   Title Pt will be independent with home exercise program.    Baseline Eval: no  current program, 10th visit:  continue to add new exercises as pt progresses, 20th:  continue to update HEP; 08/17/21: continue to progress HEP when indicated.  Visit 40: adding new exercises as pt progresses; 12/25/21: ongoing with progressions   Time 12    Period Weeks    Status On-going    Target Date 01/31/22      OT LONG TERM GOAL #2   Title Pt will complete UB and LB dressing with modified independence including buttons, snaps and zippers.    Baseline requires min assist at eval, 10th visit: occasional assist with buttons, 20th:  able to perform one handed, but difficulty with bilateral UE; 08/17/21: pt reports inconsistent with 1 hand, reviewed techniques this visit, Visit 40:  Pt requires assist with bra   Time 12    Period Weeks    Status MET   Target Date 11/08/21     OT LONG TERM GOAL #3   Title Pt will perform shower transfer with modified independence.    Baseline Pt requires supervision to min assist for shower transfer at home. 10th visit: supervision; 08/17/21: supv .  Visit 40:  Pt had met goal but had recent fall and now requiring min guard to supv   Time 6    Period Weeks    Status  Met    Target Date 11/08/21      OT LONG TERM GOAL #4   Title Pt will improve L hand grip by 10# to assist with holding items in left hand securely.    Baseline no grip in left hand at eval, 10th visit:  improved flexion but still working towards composite fisting and grip. 20th:  continues to demo decreased grip; 08/17/21: active digit flexion improving, but not yet able to register grip on dynamometer; 09/04/21: L grip 1# 7/17:  5#; 12/25/21: L grip 6#; 02/05/22: 13#   Time 12    Period Weeks    Status On-going    Target Date 01/31/22      OT LONG TERM GOAL #5   Title Pt will improve left shoulder flexion to 100 degrees or better to improve reaching to obtain self care items from shelf/shoulder height.    Baseline difficulty with reach, shoulder flexion to 47 degrees; 08/17/21: L shoulder  flexion 85, but not yet able to consistently hold ADL supplies in L hand when reaching; 09/04/21: flexion 85 7/17:  shoulder flexion to 90; 12/25/21: 105 (P 125)    Time 12    Period Weeks    Status On-going    Target Date 01/31/22      OT LONG TERM GOAL #6   Title Pt will improve FOTO score to 47 or above to demonstrate a clinically relevant change in function to impact ADL tasks.    Baseline score of 30 at eval; 08/17/21: FOTO: 42; 09/04/21: FOTO :  54 , FOTO 7/7:  60; 12/25/21: 59   Time 12    Period Weeks    Status  MET/ongoing    Target Date 01/31/22      OT LONG TERM GOAL #7   Title Pt will demonstrate ability to pick up small objects and complete 9 hole peg test in less than 1 min 30 sec (revised from under 2 min)   Baseline unable to perform at eval, 10th visit: still unable to pick up small pegs, 20th: unable to pick up pegs but improving; 08/17/21: not attempted d/t time constraints, will assess next visit; 09/04/21: 9 hole peg test in 5 min 53 sec;  7/17:  Pt unable to complete this date but was able to place 6 of 9 pegs in 4 mins 20 secs; 12/25/21: L 1 min 43 sec; 02/05/22:    Time 12    Period Weeks    Status Revised/On-going    Target Date 01/31/2022             Plan -     Clinical Impression Statement Pt making steady gains with LUE strength and flexibility.  Pt is demonstrating increased control with 1# weight for LUE strengthening exercises.  Pt is increasing L forearm supination and tighter grip as noted by ability to store small stones in palm and supinate forearm with fewer stones dropping from ulnar side of hand.  Pt still working to develop translatory skills, using thumb to move stones from palm to fingertips, which is difficult.  Pt tends to shake her forearm and hand to move stones.  Pt will continue to work towards goals in plan of care to improve left UE function, decrease pain and increase active normal movement patterns for daily tasks.    OT Occupational Profile  and History Detailed Assessment- Review of Records and additional review of physical, cognitive, psychosocial history related to current functional performance    Occupational performance deficits (Please refer to evaluation for details): ADL's;IADL's;Leisure;Rest and Sleep    Body Structure / Function / Physical Skills ADL;Coordination;Endurance;GMC;UE functional use;Balance;IADL;Pain;Dexterity;FMC;Strength;Edema;Mobility;ROM    Psychosocial Skills Environmental  Adaptations;Habits;Routines and Behaviors    Rehab Potential Good    Clinical Decision Making Several treatment options, min-mod task modification necessary   Comorbidities Affecting Occupational Performance: May have comorbidities impacting occupational performance    Modification or Assistance to Complete Evaluation  Min-Moderate modification of tasks or assist with assess necessary to complete eval    OT Frequency 2x / week    OT Duration 12 weeks    OT Treatment/Interventions Self-care/ADL training;Cryotherapy;Paraffin;Therapeutic exercise;DME and/or AE instruction;Functional Mobility Training;Balance training;Electrical Stimulation;Ultrasound;Neuromuscular education;Manual Therapy;Splinting;Moist Heat;Contrast Bath;Passive range of motion;Therapeutic activities;Patient/family education;Coping strategies training    Plan OT recert    Consulted and Agree with Plan of Care Patient            Leta Speller, MS, OTR/L  Darleene Cleaver, OT 02/15/2022, 11:55 AM

## 2022-02-19 ENCOUNTER — Ambulatory Visit: Payer: Medicare PPO

## 2022-02-19 DIAGNOSIS — M6281 Muscle weakness (generalized): Secondary | ICD-10-CM

## 2022-02-19 DIAGNOSIS — I63511 Cerebral infarction due to unspecified occlusion or stenosis of right middle cerebral artery: Secondary | ICD-10-CM

## 2022-02-19 DIAGNOSIS — R278 Other lack of coordination: Secondary | ICD-10-CM

## 2022-02-21 ENCOUNTER — Ambulatory Visit: Payer: Medicare PPO

## 2022-02-21 DIAGNOSIS — M6281 Muscle weakness (generalized): Secondary | ICD-10-CM | POA: Diagnosis not present

## 2022-02-21 DIAGNOSIS — I63511 Cerebral infarction due to unspecified occlusion or stenosis of right middle cerebral artery: Secondary | ICD-10-CM

## 2022-02-21 DIAGNOSIS — R278 Other lack of coordination: Secondary | ICD-10-CM

## 2022-02-21 NOTE — Therapy (Addendum)
OUTPATIENT OCCUPATIONAL THERAPY NEURO TREATMENT NOTE Patient Name: Teresa [name] MRN: [id number] DOB:08/05/1940, 81 y.o., female Today's Date: 12/13/2021  PCP: Dr. Jeffrey Sparks REFERRING PROVIDER: Dr. Jeffrey Sparks     OT End of Session - 02/21/22 2202     Visit Number 75    Number of Visits 94    Date for OT Re-Evaluation 04/25/22    Authorization Time Period Reporting period beginning 01/31/22    OT Start Time 1430    OT Stop Time 1515    OT Time Calculation (min) 45 min    Activity Tolerance Patient tolerated treatment well    Behavior During Therapy WFL for tasks assessed/performed             Past Medical History:  Diagnosis Date   Cancer ([facility])    skin   Hypothyroidism    Raynaud's disease    Stroke ([facility])    Past Surgical History:  Procedure Laterality Date   ABDOMINAL HYSTERECTOMY  1991   APPENDECTOMY  1991   BACK SURGERY  2010   Cervical fusion C4-5-6-7   CATARACT EXTRACTION, BILATERAL Bilateral 10/2016   CHOLECYSTECTOMY  1995   COLONOSCOPY WITH PROPOFOL N/A 12/08/2018   Procedure: COLONOSCOPY WITH PROPOFOL;  Surgeon: Toledo, Teodoro K, MD;  Location: [facility] ENDOSCOPY;  Service: Gastroenterology;  Laterality: N/A;   EYE SURGERY     Patient Active Problem List   Diagnosis Date Noted   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   Stroke ([facility]) 02/27/2021   History of nonmelanoma skin cancer 05/23/2014    ONSET DATE: 02/26/2021  REFERRING DIAG: R MCA CVA  THERAPY DIAG:  Muscle weakness (generalized)  Other lack of coordination  Right middle cerebral artery stroke ([facility])  Rationale for Evaluation and Treatment Rehabilitation  PERTINENT HISTORY: February 26, 2021, pt reports she had a CVA, came to [facility] to ER and then was transferred to [facility] in [place] where she was admitted and after acute care she went to inpatient rehab.  Following inpt rehab, pt went home and had home health.   PRECAUTIONS: fall  SUBJECTIVE:  Pt  stated that her L hand swelled this morning, along with her eyes and face when she woke up.  Pt thinks she had a reaction to her prednisone.  Face swelling improved with cold compresses, per pt, but L hand remains swollen to the point of inability to don her compression glove today.  PAIN:  Are you having pain? no OBJECTIVE:  L grip 6, R grip 41 L lateral pinch 5, R 13 L 3 point pinch 4, R 14  L 9 hole 1 min 43 sec. L shoulder active flexion 0-105, passive 0-125 02/05/22: L grip 13#  L lateral pinch 8#  3 point pinch 5#  L shoulder flex 0-108  TODAY'S TREATMENT: Moist heat for muscle relaxation and tissue mobility alternated between R/L shoulders throughout session with simultaneous completion of manual therapy and therapeutic exercises.  Manual Therapy: Edema massage completed to L hand, including wrist and digits.    Therapeutic Exercise: Facilitated L grip strengthening with hand gripper set at min resistance with 1 red band to complete 3 sets 10 reps.  Rest between sets and cues for technique to maximize hand closure with each squeeze. Performed passive stretching for the L shoulder flexion, abd, ER, horiz add, forearm pron/sup for increasing flexibility to engage the LUE into daily tasks.  Performed metacarpal spread stretches, wrist AROM all planes, and encouraged pt complete these as needed   throughout the day for decreasing edema in hand.      PATIENT EDUCATION: Education details: edema management L hand Person educated: pt Education method: Explanation and Verbal cues Education comprehension: verbalized understanding   HOME EXERCISE PROGRAM Continue to engage LUE into ADLs; continue gripping and pinching exercises for LUE, and L shoulder AROM/AAROM, crocheting, typing; increase participation in IADL tasks for greater use of L hand.    OT Long Term Goals -       OT LONG TERM GOAL #1   Title Pt will be independent with home exercise program.    Baseline Eval: no current  program, 10th visit:  continue to add new exercises as pt progresses, 20th:  continue to update HEP; 08/17/21: continue to progress HEP when indicated.  Visit 40: adding new exercises as pt progresses; 12/25/21: ongoing with progressions   Time 12    Period Weeks    Status On-going    Target Date 01/31/22      OT LONG TERM GOAL #2   Title Pt will complete UB and LB dressing with modified independence including buttons, snaps and zippers.    Baseline requires min assist at eval, 10th visit: occasional assist with buttons, 20th:  able to perform one handed, but difficulty with bilateral UE; 08/17/21: pt reports inconsistent with 1 hand, reviewed techniques this visit, Visit 40:  Pt requires assist with bra   Time 12    Period Weeks    Status MET   Target Date 11/08/21     OT LONG TERM GOAL #3   Title Pt will perform shower transfer with modified independence.    Baseline Pt requires supervision to min assist for shower transfer at home. 10th visit: supervision; 08/17/21: supv .  Visit 40:  Pt had met goal but had recent fall and now requiring min guard to supv   Time 6    Period Weeks    Status  Met    Target Date 11/08/21      OT LONG TERM GOAL #4   Title Pt will improve L hand grip by 10# to assist with holding items in left hand securely.    Baseline no grip in left hand at eval, 10th visit:  improved flexion but still working towards composite fisting and grip. 20th:  continues to demo decreased grip; 08/17/21: active digit flexion improving, but not yet able to register grip on dynamometer; 09/04/21: L grip 1# 7/17:  5#; 12/25/21: L grip 6#; 02/05/22: 13#   Time 12    Period Weeks    Status On-going    Target Date 01/31/22      OT LONG TERM GOAL #5   Title Pt will improve left shoulder flexion to 100 degrees or better to improve reaching to obtain self care items from shelf/shoulder height.    Baseline difficulty with reach, shoulder flexion to 47 degrees; 08/17/21: L shoulder flexion 85,  but not yet able to consistently hold ADL supplies in L hand when reaching; 09/04/21: flexion 85 7/17:  shoulder flexion to 90; 12/25/21: 105 (P 125)    Time 12    Period Weeks    Status On-going    Target Date 01/31/22      OT LONG TERM GOAL #6   Title Pt will improve FOTO score to 47 or above to demonstrate a clinically relevant change in function to impact ADL tasks.    Baseline score of 30 at eval; 08/17/21: FOTO: 42; 09/04/21: FOTO : 54 , FOTO  7/7:  60; 12/25/21: 59   Time 12    Period Weeks    Status  MET/ongoing    Target Date 01/31/22      OT LONG TERM GOAL #7   Title Pt will demonstrate ability to pick up small objects and complete 9 hole peg test in less than 1 min 30 sec (revised from under 2 min)   Baseline unable to perform at eval, 10th visit: still unable to pick up small pegs, 20th: unable to pick up pegs but improving; 08/17/21: not attempted d/t time constraints, will assess next visit; 09/04/21: 9 hole peg test in 5 min 53 sec;  7/17:  Pt unable to complete this date but was able to place 6 of 9 pegs in 4 mins 20 secs; 12/25/21: L 1 min 43 sec; 02/05/22:    Time 12    Period Weeks    Status Revised/On-going    Target Date 01/31/2022             Plan -     Clinical Impression Statement Pt stated that her L hand swelled this morning, along with her eyes and face when she woke up.  Pt thinks she had a reaction to her prednisone.  Face swelling improved with cold compresses, per pt, but L hand remains swollen to the point of inability to don her compression glove today.  Pt tolerated edema massage well this session with noted improvement in edema as demonstrated by inability to make composite fist beginning of session, but able by end of session.  Encouraged pt try donning her compression glove when she gets home today, and advised to continue to elevate hand and perform AROM in wrist and hand often throughout the day.  Encouraged pt reach out to her PCP as well to notify of a  possible medication reaction.  Pt verbalized understanding.  Pt will continue to work towards goals in plan of care to improve left UE function, decrease pain and increase active normal movement patterns for daily tasks.    OT Occupational Profile and History Detailed Assessment- Review of Records and additional review of physical, cognitive, psychosocial history related to current functional performance    Occupational performance deficits (Please refer to evaluation for details): ADL's;IADL's;Leisure;Rest and Sleep    Body Structure / Function / Physical Skills ADL;Coordination;Endurance;GMC;UE functional use;Balance;IADL;Pain;Dexterity;FMC;Strength;Edema;Mobility;ROM    Psychosocial Skills Environmental  Adaptations;Habits;Routines and Behaviors    Rehab Potential Good    Clinical Decision Making Several treatment options, min-mod task modification necessary   Comorbidities Affecting Occupational Performance: May have comorbidities impacting occupational performance    Modification or Assistance to Complete Evaluation  Min-Moderate modification of tasks or assist with assess necessary to complete eval    OT Frequency 2x / week    OT Duration 12 weeks    OT Treatment/Interventions Self-care/ADL training;Cryotherapy;Paraffin;Therapeutic exercise;DME and/or AE instruction;Functional Mobility Training;Balance training;Electrical Stimulation;Ultrasound;Neuromuscular education;Manual Therapy;Splinting;Moist Heat;Contrast Bath;Passive range of motion;Therapeutic activities;Patient/family education;Coping strategies training    Plan OT recert    Consulted and Agree with Plan of Care Patient            Leta Speller, MS, OTR/L  Darleene Cleaver, OT 02/21/2022, 10:04 PM

## 2022-02-21 NOTE — Therapy (Signed)
OUTPATIENT OCCUPATIONAL THERAPY NEURO TREATMENT NOTE Patient Name: Teresa [name] MRN: [id number] DOB:1940-07-16, 81 y.o., female Today's Date: 12/13/2021  PCP: Dr. [name] REFERRING PROVIDER: Dr. [name]     OT End of Session - 02/21/22 0939     Visit Number [id number]    Number of Visits 94    Date for OT Re-Evaluation 04/25/22    Authorization Time Period Reporting period beginning 01/31/22    OT Start Time 1145    OT Stop Time 1230    OT Time Calculation (min) 45 min    Activity Tolerance Patient tolerated treatment well    Behavior During Therapy [facility] for tasks assessed/performed             Past Medical History:  Diagnosis Date   Cancer ([place])    skin   Hypothyroidism    Raynaud's disease    Stroke 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  Following inpt rehab, pt went home and had home health.   PRECAUTIONS: fall  SUBJECTIVE:  Pt  reported that she had a good holiday with her family. PAIN:  Are you having pain? no   OBJECTIVE:  L grip 6, R grip 41 L lateral pinch 5, R 13 L 3 point pinch 4, R 14  L 9 hole 1 min 43 sec. L shoulder active flexion 0-105, passive 0-125 02/05/22: L grip 13#  L lateral pinch 8#  3 point pinch 5#  L shoulder flex 0-108  TODAY'S TREATMENT: Moist heat for muscle relaxation and tissue mobility alternated between R/L shoulders throughout session with simultaneous completion of neuro re-ed and therapeutic exercises.  Therapeutic Exercise: Facilitated L grip strengthening with hand gripper set at min resistance with 1 red band to complete 3 sets 10 reps.  Rest between sets and cues for technique to maximize hand closure with each squeeze. Performed passive stretching for the L shoulder flexion, abd, ER, horiz add, forearm pron/sup for increasing flexibility to engage the LUE into daily tasks.  Performed shoulder pulley exercises in standing, back against wall.  Pt performed shoulder flexion and abd for increasing L shoulder mobility, requiring min-mod vc for form and technique to reduce compensatory leaning.   Neuro re-ed: Practiced L hand [place] and dexterity skills working with [place] stones.  Pt practiced scooping and storing stones in L hand, and translatory skills moving stones from palm to fingers to discard without dropping others from  palm.  Pt required min vc for tighter closure of 4th and 5th digits to palm and increased supination of forearm in order to decrease dropped stones from ulnar side of palm.  Pt required mod vc for technique to use thumb to slide stones from palm to fingertips.    PATIENT EDUCATION: Education details: LUE strengthening Person educated: pt Education method: Explanation and Verbal cues Education comprehension: verbalized understanding   HOME EXERCISE PROGRAM Continue to engage LUE into ADLs; continue gripping and pinching exercises for LUE, and L shoulder  AROM/AAROM, crocheting, typing; increase participation in IADL tasks for greater use of L hand.    OT Long Term Goals -       OT LONG TERM GOAL #1   Title Pt will be independent with home exercise program.    Baseline Eval: no current program, 10th visit:  continue to add new exercises as pt progresses, 20th:  continue to update HEP; 08/17/21: continue to progress HEP when indicated.  Visit 40: adding new exercises as pt progresses; 12/25/21: ongoing with progressions   Time 12    Period Weeks    Status On-going    Target Date 01/31/22      OT LONG TERM GOAL #2   Title Pt will complete UB and LB dressing with modified independence including buttons, snaps and zippers.    Baseline requires min assist at eval, 10th visit: occasional assist with buttons, 20th:  able to perform one handed, but difficulty with bilateral UE; 08/17/21: pt reports inconsistent with 1 hand, reviewed techniques this visit, Visit 40:  Pt requires assist with bra   Time 12    Period Weeks    Status MET   Target Date 11/08/21     OT LONG TERM GOAL #3   Title Pt will perform shower transfer with modified independence.    Baseline Pt requires supervision to min assist for shower transfer at home. 10th visit: supervision; 08/17/21: supv .  Visit 40:  Pt had met goal but had recent fall and now requiring min guard to supv   Time 6    Period Weeks    Status  Met    Target Date 11/08/21      OT LONG TERM GOAL #4   Title Pt will improve L hand grip by 10# to assist with holding items in left hand securely.    Baseline no grip in left hand at eval, 10th visit:  improved flexion but still working towards composite fisting and grip. 20th:  continues to demo decreased grip; 08/17/21: active digit flexion improving, but not yet able to register grip on dynamometer; 09/04/21: L grip 1# 7/17:  5#; 12/25/21: L grip 6#; 02/05/22: 13#   Time 12    Period Weeks    Status On-going    Target Date 01/31/22      OT LONG TERM GOAL #5    Title Pt will improve left shoulder flexion to 100 degrees or better to improve reaching to obtain self care items from shelf/shoulder height.    Baseline difficulty with reach, shoulder flexion to 47 degrees; 08/17/21: L shoulder flexion 85, but not yet able to consistently hold ADL supplies in L hand when reaching; 09/04/21: flexion 85 7/17:  shoulder flexion to 90; 12/25/21: 105 (P 125)    Time 12    Period Weeks    Status On-going    Target Date 01/31/22      OT LONG TERM GOAL #6   Title Pt will  improve FOTO score to 47 or above to demonstrate a clinically relevant change in function to impact ADL tasks.    Baseline score of 30 at eval; 08/17/21: FOTO: 42; 09/04/21: FOTO : 54 , FOTO 7/7:  60; 12/25/21: 59   Time 12    Period Weeks    Status  MET/ongoing    Target Date 01/31/22      OT LONG TERM GOAL #7   Title Pt will demonstrate ability to pick up small objects and complete 9 hole peg test in less than 1 min 30 sec (revised from under 2 min)   Baseline unable to perform at eval, 10th visit: still unable to pick up small pegs, 20th: unable to pick up pegs but improving; 08/17/21: not attempted d/t time constraints, will assess next visit; 09/04/21: 9 hole peg test in 5 min 53 sec;  7/17:  Pt unable to complete this date but was able to place 6 of 9 pegs in 4 mins 20 secs; 12/25/21: L 1 min 43 sec; 02/05/22:    Time 12    Period Weeks    Status Revised/On-going    Target Date 01/31/2022             Plan -     Clinical Impression Statement Pt making steady gains with LUE strength and flexibility.  Pt required fewer cues for form and technique with use of shoulder pulley this date.  Pt is increasing L forearm supination and tighter grip as noted by ability to store small stones in palm and supinate forearm with fewer stones dropping from ulnar side of hand.  Pt still working to develop translatory skills, using thumb to move stones from palm to fingertips, which is difficult.  Pt tends to  shake her forearm and hand to move stones.  Pt required mod vc for technique to use thumb to slide stones from palm to fingertips.  Pt will continue to work towards goals in plan of care to improve left UE function, decrease pain and increase active normal movement patterns for daily tasks.    OT Occupational Profile and History Detailed Assessment- Review of Records and additional review of physical, cognitive, psychosocial history related to current functional performance    Occupational performance deficits (Please refer to evaluation for details): ADL's;IADL's;Leisure;Rest and Sleep    Body Structure / Function / Physical Skills ADL;Coordination;Endurance;GMC;UE functional use;Balance;IADL;Pain;Dexterity;FMC;Strength;Edema;Mobility;ROM    Psychosocial Skills Environmental  Adaptations;Habits;Routines and Behaviors    Rehab Potential Good    Clinical Decision Making Several treatment options, min-mod task modification necessary   Comorbidities Affecting Occupational Performance: May have comorbidities impacting occupational performance    Modification or Assistance to Complete Evaluation  Min-Moderate modification of tasks or assist with assess necessary to complete eval    OT Frequency 2x / week    OT Duration 12 weeks    OT Treatment/Interventions Self-care/ADL training;Cryotherapy;Paraffin;Therapeutic exercise;DME and/or AE instruction;Functional Mobility Training;Balance training;Electrical Stimulation;Ultrasound;Neuromuscular education;Manual Therapy;Splinting;Moist Heat;Contrast Bath;Passive range of motion;Therapeutic activities;Patient/family education;Coping strategies training    Plan OT recert    Consulted and Agree with Plan of Care Patient            Leta Speller, MS, OTR/L  Darleene Cleaver, OT 02/21/2022, 9:41 AM

## 2022-02-26 ENCOUNTER — Ambulatory Visit: Payer: Medicare PPO

## 2022-02-26 ENCOUNTER — Ambulatory Visit: Payer: Medicare PPO | Attending: Internal Medicine

## 2022-02-26 DIAGNOSIS — M6281 Muscle weakness (generalized): Secondary | ICD-10-CM | POA: Diagnosis present

## 2022-02-26 DIAGNOSIS — R2689 Other abnormalities of gait and mobility: Secondary | ICD-10-CM | POA: Diagnosis present

## 2022-02-26 DIAGNOSIS — R278 Other lack of coordination: Secondary | ICD-10-CM | POA: Diagnosis present

## 2022-02-26 DIAGNOSIS — I63511 Cerebral infarction due to unspecified occlusion or stenosis of right middle cerebral artery: Secondary | ICD-10-CM | POA: Diagnosis present

## 2022-02-26 DIAGNOSIS — N393 Stress incontinence (female) (male): Secondary | ICD-10-CM | POA: Diagnosis present

## 2022-02-26 NOTE — Therapy (Signed)
OUTPATIENT OCCUPATIONAL THERAPY NEURO TREATMENT NOTE Patient Name: Teresa [name] MRN: [id number] DOB:[birth year], 81 y.o., female Today's Date: 12/13/2021  PCP: Dr. [name] REFERRING PROVIDER: Dr. [name]     OT End of Session - 02/26/22 1130     Visit Number 15    Number of Visits 94    Date for OT Re-Evaluation 04/25/22    Authorization Time Period Reporting period beginning 01/31/22    OT Start Time 1102    OT Stop Time 1145    OT Time Calculation (min) 43 min    Equipment Utilized During Treatment cane    Activity Tolerance Patient tolerated treatment well    Behavior During Therapy WFL for tasks assessed/performed             Past Medical History:  Diagnosis Date   Cancer ([place])    skin   Hypothyroidism    Raynaud's disease    Stroke [facility])    Past Surgical History:  Procedure Laterality Date   [place]  2010   Cervical fusion C4-5-6-7   CATARACT EXTRACTION, BILATERAL Bilateral 10/2016   CHOLECYSTECTOMY  1995   COLONOSCOPY WITH PROPOFOL N/A 12/08/2018   Procedure: COLONOSCOPY WITH PROPOFOL;  Surgeon: Toledo, Benay Pike, MD;  Location: [facility] ENDOSCOPY;  Service: Gastroenterology;  Laterality: N/A;   EYE SURGERY     Patient Active Problem List   Diagnosis Date Noted   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 in [place] where she was admitted and after acute care she went to inpatient rehab.  Following inpt rehab, pt went home and had home  health.   PRECAUTIONS: fall  SUBJECTIVE:  Pt reported that she did reach out to Dr. Doy Hutching following her episode of increased swelling the other day with her prednisone, and MD encouraged her to drink more water.  Pt reports the swelling did not return in her face or hand and pt was able to don her compression glove today.  PAIN:  Are you having pain? no OBJECTIVE:  L grip 6, R grip 41 L lateral pinch 5, R 13 L 3 point pinch 4, R 14  L 9 hole 1 min 43 sec. L shoulder active flexion 0-105, passive 0-125 02/05/22: L grip 13#  L lateral pinch 8#  3 point pinch 5#  L shoulder flex 0-108  TODAY'S TREATMENT: Moist heat for muscle relaxation and tissue mobility alternated between R/L shoulders throughout session with simultaneous completion of neuro re-ed to LUE.   Therapeutic Exercise: Facilitated L grip strengthening with hand gripper set at min resistance with 1 red band to complete 5 sets 10 reps; sets spread out throughout duration of session.  Rest between sets and cues for technique to maximize hand closure with each squeeze.  Performed passive stretching for the L shoulder flexion, abd, ER, forearm pron/sup for increasing flexibility to engage the LUE into daily tasks.  Performed standing shoulder pulley exercises for L shoulder flexion and abd, min guard to maintain form and  technique.       Neuro re-ed: Facilitated L FMC/GMC skills working to pick up 1/2"-1" washers from dish and placed over vertical dowels to target small item pick up and reaching toward a target.  Practiced scooping up 1 and multiple washers at a time from resistive magnetic dish, storing in hand, and moving 1 from palm to fingertips.  Pt required verbal and visual cues for tight grasp and increasing forearm supination to keep from dropping washers from ulnar side of palm.    PATIENT EDUCATION: Education details: L hand coordination skills Person educated: pt Education method: Explanation and Verbal  cues Education comprehension: verbalized understanding   HOME EXERCISE PROGRAM Continue to engage LUE into ADLs; continue gripping and pinching exercises for LUE, and L shoulder AROM/AAROM, crocheting, typing; increase participation in IADL tasks for greater use of L hand.    OT Long Term Goals -       OT LONG TERM GOAL #1   Title Pt will be independent with home exercise program.    Baseline Eval: no current program, 10th visit:  continue to add new exercises as pt progresses, 20th:  continue to update HEP; 08/17/21: continue to progress HEP when indicated.  Visit 40: adding new exercises as pt progresses; 12/25/21: ongoing with progressions   Time 12    Period Weeks    Status On-going    Target Date 01/31/22      OT LONG TERM GOAL #2   Title Pt will complete UB and LB dressing with modified independence including buttons, snaps and zippers.    Baseline requires min assist at eval, 10th visit: occasional assist with buttons, 20th:  able to perform one handed, but difficulty with bilateral UE; 08/17/21: pt reports inconsistent with 1 hand, reviewed techniques this visit, Visit 40:  Pt requires assist with bra   Time 12    Period Weeks    Status MET   Target Date 11/08/21     OT LONG TERM GOAL #3   Title Pt will perform shower transfer with modified independence.    Baseline Pt requires supervision to min assist for shower transfer at home. 10th visit: supervision; 08/17/21: supv .  Visit 40:  Pt had met goal but had recent fall and now requiring min guard to supv   Time 6    Period Weeks    Status  Met    Target Date 11/08/21      OT LONG TERM GOAL #4   Title Pt will improve L hand grip by 10# to assist with holding items in left hand securely.    Baseline no grip in left hand at eval, 10th visit:  improved flexion but still working towards composite fisting and grip. 20th:  continues to demo decreased grip; 08/17/21: active digit flexion improving, but not yet able to register grip  on dynamometer; 09/04/21: L grip 1# 7/17:  5#; 12/25/21: L grip 6#; 02/05/22: 13#   Time 12    Period Weeks    Status On-going    Target Date 01/31/22      OT LONG TERM GOAL #5   Title Pt will improve left shoulder flexion to 100 degrees or better to improve reaching to obtain self care items from shelf/shoulder height.    Baseline difficulty with reach, shoulder flexion to 47 degrees; 08/17/21: L shoulder flexion 85, but not yet able to consistently hold ADL supplies in L hand when reaching; 09/04/21: flexion 85 7/17:  shoulder flexion to 90; 12/25/21:  105 (P 125)    Time 12    Period Weeks    Status On-going    Target Date 01/31/22      OT LONG TERM GOAL #6   Title Pt will improve FOTO score to 47 or above to demonstrate a clinically relevant change in function to impact ADL tasks.    Baseline score of 30 at eval; 08/17/21: FOTO: 42; 09/04/21: FOTO : 54 , FOTO 7/7:  60; 12/25/21: 59   Time 12    Period Weeks    Status  MET/ongoing    Target Date 01/31/22      OT LONG TERM GOAL #7   Title Pt will demonstrate ability to pick up small objects and complete 9 hole peg test in less than 1 min 30 sec (revised from under 2 min)   Baseline unable to perform at eval, 10th visit: still unable to pick up small pegs, 20th: unable to pick up pegs but improving; 08/17/21: not attempted d/t time constraints, will assess next visit; 09/04/21: 9 hole peg test in 5 min 53 sec;  7/17:  Pt unable to complete this date but was able to place 6 of 9 pegs in 4 mins 20 secs; 12/25/21: L 1 min 43 sec; 02/05/22:    Time 12    Period Weeks    Status Revised/On-going    Target Date 01/31/2022             Plan -     Clinical Impression Statement Pt reports the swelling did not return in her face or hand the other day and pt was able to don her compression glove today.  Pt continues to demonstrate improved form with L forward flexion with the shoulder pulley and with active reaching to place washers over top a vertical  dowel; pt was able to complete the latter without lateral leaning.  Practiced scooping up 1 and multiple washers at a time from resistive magnetic dish, storing in hand, and moving 1 from palm to fingertips.  Pt required verbal and visual cues for tight grasp and increasing forearm supination to keep from dropping washers from ulnar side of palm.  Pt will continue to work towards goals in plan of care to improve left UE function, decrease pain and increase active normal movement patterns for daily tasks.    OT Occupational Profile and History Detailed Assessment- Review of Records and additional review of physical, cognitive, psychosocial history related to current functional performance    Occupational performance deficits (Please refer to evaluation for details): ADL's;IADL's;Leisure;Rest and Sleep    Body Structure / Function / Physical Skills ADL;Coordination;Endurance;GMC;UE functional use;Balance;IADL;Pain;Dexterity;FMC;Strength;Edema;Mobility;ROM    Psychosocial Skills Environmental  Adaptations;Habits;Routines and Behaviors    Rehab Potential Good    Clinical Decision Making Several treatment options, min-mod task modification necessary   Comorbidities Affecting Occupational Performance: May have comorbidities impacting occupational performance    Modification or Assistance to Complete Evaluation  Min-Moderate modification of tasks or assist with assess necessary to complete eval    OT Frequency 2x / week    OT Duration 12 weeks    OT Treatment/Interventions Self-care/ADL training;Cryotherapy;Paraffin;Therapeutic exercise;DME and/or AE instruction;Functional Mobility Training;Balance training;Electrical Stimulation;Ultrasound;Neuromuscular education;Manual Therapy;Splinting;Moist Heat;Contrast Bath;Passive range of motion;Therapeutic activities;Patient/family education;Coping strategies training    Plan OT recert    Consulted and Agree with Plan of Care Patient            Leta Speller,  MS, OTR/L  Darleene Cleaver, OT 02/26/2022, 11:38 AM

## 2022-02-28 ENCOUNTER — Ambulatory Visit: Payer: Medicare PPO

## 2022-02-28 DIAGNOSIS — M6281 Muscle weakness (generalized): Secondary | ICD-10-CM | POA: Diagnosis not present

## 2022-02-28 DIAGNOSIS — R278 Other lack of coordination: Secondary | ICD-10-CM

## 2022-02-28 DIAGNOSIS — I63511 Cerebral infarction due to unspecified occlusion or stenosis of right middle cerebral artery: Secondary | ICD-10-CM

## 2022-03-03 NOTE — Therapy (Signed)
OUTPATIENT OCCUPATIONAL THERAPY NEURO TREATMENT NOTE Patient Name: Teresa [name] MRN: [id number] DOB:02/16/1941, 81 y.o., female Today's Date: 12/13/2021  PCP: Dr. [name] REFERRING PROVIDER: Dr. [name]     OT End of Session - 03/03/22 1817     Visit Number [id number]    Number of Visits 94    Date for OT Re-Evaluation 04/25/22    Authorization Time Period Reporting period beginning 01/31/22    OT Start Time 1430    OT Stop Time 1515    OT Time Calculation (min) 45 min    Equipment Utilized During Treatment cane    Activity Tolerance Patient tolerated treatment well    Behavior During Therapy WFL for tasks assessed/performed             Past Medical History:  Diagnosis Date   Cancer ([place])    skin   Hypothyroidism    Raynaud's disease    Stroke [facility])    Past Surgical History:  Procedure Laterality Date   [place]  2010   Cervical fusion C4-5-6-7   CATARACT EXTRACTION, BILATERAL Bilateral 10/2016   CHOLECYSTECTOMY  1995   COLONOSCOPY WITH PROPOFOL N/A 12/08/2018   Procedure: COLONOSCOPY WITH PROPOFOL;  Surgeon: Toledo, Benay Pike, MD;  Location: [facility] ENDOSCOPY;  Service: Gastroenterology;  Laterality: N/A;   EYE SURGERY     Patient Active Problem List   Diagnosis Date Noted   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  Following inpt rehab, pt went home and had home  health.   PRECAUTIONS: fall  SUBJECTIVE:  Pt reports feeling exhausted today after staying up late last night.  PAIN:  Are you having pain? no OBJECTIVE:  L grip 6, R grip 41 L lateral pinch 5, R 13 L 3 point pinch 4, R 14  L 9 hole 1 min 43 sec. L shoulder active flexion 0-105, passive 0-125 02/05/22: L grip 13#  L lateral pinch 8#  3 point pinch 5#  L shoulder flex 0-108  TODAY'S TREATMENT: Moist heat for muscle relaxation and tissue mobility alternated between R/L shoulders throughout session with simultaneous completion of neuro re-ed to LUE.   Therapeutic Exercise: Facilitated hand strengthening with use of hand gripper set at 6.6# to remove jumbo pegs from pegboard x2 trials using L hand.  Pt required intermittent tactile and vc to reposition gripper in hand and to engage L SF to maximize grip and reduce dropped pegs.  Performed passive stretching for the L shoulder flexion, abd, ER, forearm pron/sup for increasing flexibility to engage the LUE into daily tasks.       Self Care: Practiced bilat hand coordination skills working to open various pill container tops and manipulating pills between each hand.  Pt required min A for push and twist technique, requiring multiple trials for successful open.  Able to independently open pull down tops and twist tops.  Pt practiced dumping  a handful of pills into L hand, dumping pills back into container, dumping pills into R palm, and using L hand to pick up pills 1 at a time to store in hand.    PATIENT EDUCATION: Education details: L hand coordination skills Person educated: pt Education method: Explanation and Verbal cues Education comprehension: verbalized understanding   HOME EXERCISE PROGRAM Continue to engage LUE into ADLs; continue gripping and pinching exercises for LUE, and L shoulder AROM/AAROM, crocheting, typing; increase participation in IADL tasks for greater use of L hand.    OT Long Term Goals -       OT LONG  TERM GOAL #1   Title Pt will be independent with home exercise program.    Baseline Eval: no current program, 10th visit:  continue to add new exercises as pt progresses, 20th:  continue to update HEP; 08/17/21: continue to progress HEP when indicated.  Visit 40: adding new exercises as pt progresses; 12/25/21: ongoing with progressions   Time 12    Period Weeks    Status On-going    Target Date 01/31/22      OT LONG TERM GOAL #2   Title Pt will complete UB and LB dressing with modified independence including buttons, snaps and zippers.    Baseline requires min assist at eval, 10th visit: occasional assist with buttons, 20th:  able to perform one handed, but difficulty with bilateral UE; 08/17/21: pt reports inconsistent with 1 hand, reviewed techniques this visit, Visit 40:  Pt requires assist with bra   Time 12    Period Weeks    Status MET   Target Date 11/08/21     OT LONG TERM GOAL #3   Title Pt will perform shower transfer with modified independence.    Baseline Pt requires supervision to min assist for shower transfer at home. 10th visit: supervision; 08/17/21: supv .  Visit 40:  Pt had met goal but had recent fall and now requiring min guard to supv   Time 6    Period Weeks    Status  Met    Target Date 11/08/21      OT LONG TERM GOAL #4   Title Pt will improve L hand grip by 10# to assist with holding items in left hand securely.    Baseline no grip in left hand at eval, 10th visit:  improved flexion but still working towards composite fisting and grip. 20th:  continues to demo decreased grip; 08/17/21: active digit flexion improving, but not yet able to register grip on dynamometer; 09/04/21: L grip 1# 7/17:  5#; 12/25/21: L grip 6#; 02/05/22: 13#   Time 12    Period Weeks    Status On-going    Target Date 01/31/22      OT LONG TERM GOAL #5   Title Pt will improve left shoulder flexion to 100 degrees or better to improve reaching to obtain self care items from shelf/shoulder  height.    Baseline difficulty with reach, shoulder flexion to 47 degrees; 08/17/21: L shoulder flexion 85, but not yet able to consistently hold ADL supplies in L hand when reaching; 09/04/21: flexion 85 7/17:  shoulder flexion to 90; 12/25/21: 105 (P 125)    Time 12    Period Weeks    Status On-going    Target Date 01/31/22      OT LONG TERM GOAL #6   Title Pt will improve FOTO score to 47 or above to demonstrate a clinically relevant change in  function to impact ADL tasks.    Baseline score of 30 at eval; 08/17/21: FOTO: 42; 09/04/21: FOTO : 54 , FOTO 7/7:  60; 12/25/21: 59   Time 12    Period Weeks    Status  MET/ongoing    Target Date 01/31/22      OT LONG TERM GOAL #7   Title Pt will demonstrate ability to pick up small objects and complete 9 hole peg test in less than 1 min 30 sec (revised from under 2 min)   Baseline unable to perform at eval, 10th visit: still unable to pick up small pegs, 20th: unable to pick up pegs but improving; 08/17/21: not attempted d/t time constraints, will assess next visit; 09/04/21: 9 hole peg test in 5 min 53 sec;  7/17:  Pt unable to complete this date but was able to place 6 of 9 pegs in 4 mins 20 secs; 12/25/21: L 1 min 43 sec; 02/05/22:    Time 12    Period Weeks    Status Revised/On-going    Target Date 01/31/2022             Plan -     Clinical Impression Statement Pt tolerated 2 trials with hand gripper at 6.6# with intermittent tactile and vc to reposition gripper in hand and to engage L SF to maximize grip and reduce dropped pegs.  Pt able to independently open 4 of 5 types of medication bottles.  Lacking enough combination movement of push and twist on 1 of 5 caps.  Pt will continue to work towards goals in plan of care to improve left UE function, decrease pain and increase active normal movement patterns for daily tasks.    OT Occupational Profile and History Detailed Assessment- Review of Records and additional review of physical, cognitive,  psychosocial history related to current functional performance    Occupational performance deficits (Please refer to evaluation for details): ADL's;IADL's;Leisure;Rest and Sleep    Body Structure / Function / Physical Skills ADL;Coordination;Endurance;GMC;UE functional use;Balance;IADL;Pain;Dexterity;FMC;Strength;Edema;Mobility;ROM    Psychosocial Skills Environmental  Adaptations;Habits;Routines and Behaviors    Rehab Potential Good    Clinical Decision Making Several treatment options, min-mod task modification necessary   Comorbidities Affecting Occupational Performance: May have comorbidities impacting occupational performance    Modification or Assistance to Complete Evaluation  Min-Moderate modification of tasks or assist with assess necessary to complete eval    OT Frequency 2x / week    OT Duration 12 weeks    OT Treatment/Interventions Self-care/ADL training;Cryotherapy;Paraffin;Therapeutic exercise;DME and/or AE instruction;Functional Mobility Training;Balance training;Electrical Stimulation;Ultrasound;Neuromuscular education;Manual Therapy;Splinting;Moist Heat;Contrast Bath;Passive range of motion;Therapeutic activities;Patient/family education;Coping strategies training    Plan OT recert    Consulted and Agree with Plan of Care Patient            Leta Speller, MS, OTR/L  Darleene Cleaver, OT 03/03/2022, 6:26 PM

## 2022-03-05 ENCOUNTER — Ambulatory Visit: Payer: Medicare PPO

## 2022-03-05 DIAGNOSIS — I63511 Cerebral infarction due to unspecified occlusion or stenosis of right middle cerebral artery: Secondary | ICD-10-CM

## 2022-03-05 DIAGNOSIS — M6281 Muscle weakness (generalized): Secondary | ICD-10-CM

## 2022-03-05 DIAGNOSIS — R278 Other lack of coordination: Secondary | ICD-10-CM

## 2022-03-05 NOTE — Therapy (Signed)
OUTPATIENT OCCUPATIONAL THERAPY NEURO TREATMENT NOTE Patient Name: Teresa [name] MRN: [id number] DOB:09-Aug-1940, 81 y.o., female Today's Date: 12/13/2021  PCP: Dr. [name] REFERRING PROVIDER: Dr. [name]     OT End of Session - 03/05/22 1131     Visit Number 78    Number of Visits 94    Date for OT Re-Evaluation 04/25/22    Authorization Time Period Reporting period beginning 01/31/22    OT Start Time 1100    OT Stop Time 1145    OT Time Calculation (min) 45 min    Equipment Utilized During Treatment cane    Activity Tolerance Patient tolerated treatment well    Behavior During Therapy WFL for tasks assessed/performed             Past Medical History:  Diagnosis Date   Cancer ([place])    skin   Hypothyroidism    Raynaud's disease    Stroke [facility])    Past Surgical History:  Procedure Laterality Date   [place]  2010   Cervical fusion C4-5-6-7   CATARACT EXTRACTION, BILATERAL Bilateral 10/2016   CHOLECYSTECTOMY  1995   COLONOSCOPY WITH PROPOFOL N/A 12/08/2018   Procedure: COLONOSCOPY WITH PROPOFOL;  Surgeon: Toledo, Benay Pike, MD;  Location: [facility] ENDOSCOPY;  Service: Gastroenterology;  Laterality: N/A;   EYE SURGERY     Patient Active Problem List   Diagnosis Date Noted   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  Following inpt rehab, pt went home and had home  health.   PRECAUTIONS: fall  SUBJECTIVE:  Pt reports her hands are more cold today from the weather and Raynaud's.  PAIN:  Are you having pain? no OBJECTIVE:  L grip 6, R grip 41 L lateral pinch 5, R 13 L 3 point pinch 4, R 14  L 9 hole 1 min 43 sec. L shoulder active flexion 0-105, passive 0-125 02/05/22: L grip 13#  L lateral pinch 8#  3 point pinch 5#  L shoulder flex 0-108  TODAY'S TREATMENT: Therapeutic Exercise:  Performed passive stretching for the L shoulder flexion, abd, ER, forearm pron/sup for increasing flexibility to engage the LUE into daily tasks. Facilitated L hand strengthening with use of hand gripper set at min resistance for 1 set of 10 reps (1 red band) and mod resistance for 4 sets 10 reps (1 green band).  Sets spread out throughout session to allow for rest breaks.  Pt required intermittent assist to reposition hand gripper in hand to avoid dropping gripper and vc to ensure L SF is engaged into gripping patterns.  Facilitated lateral and 3 point pinch strengthening with resistive clothespins.  Pt clipped yellow, red, green, and blue clips onto vertical dowel for 1 trial.  Engaged L shoulder into forward, ipsilateral, and contralateral reaching patterns to remove clips from dowel using LUE.     Neuro re-ed:  Facilitated L hand [facility]  and dexterity skills working to pick up and place grooved pegs into pegboard.  Pt was able to place 1 full row with extra time and   rest breaks, but was unable to reposition peg in hand to line grooves up with a designated groove.  Pt would either put the peg back down to pick it up from a different angle or find another groove to match the current peg angle.   PATIENT EDUCATION: Education details: L hand coordination skills Person educated: pt Education method: Explanation and Verbal cues Education comprehension: verbalized understanding   HOME EXERCISE PROGRAM Continue to engage LUE into ADLs; continue gripping and pinching  exercises for LUE, and L shoulder AROM/AAROM, crocheting, typing; increase participation in IADL tasks for greater use of L hand.    OT Long Term Goals -       OT LONG TERM GOAL #1   Title Pt will be independent with home exercise program.    Baseline Eval: no current program, 10th visit:  continue to add new exercises as pt progresses, 20th:  continue to update HEP; 08/17/21: continue to progress HEP when indicated.  Visit 40: adding new exercises as pt progresses; 12/25/21: ongoing with progressions   Time 12    Period Weeks    Status On-going    Target Date 01/31/22      OT LONG TERM GOAL #2   Title Pt will complete UB and LB dressing with modified independence including buttons, snaps and zippers.    Baseline requires min assist at eval, 10th visit: occasional assist with buttons, 20th:  able to perform one handed, but difficulty with bilateral UE; 08/17/21: pt reports inconsistent with 1 hand, reviewed techniques this visit, Visit 40:  Pt requires assist with bra   Time 12    Period Weeks    Status MET   Target Date 11/08/21     OT LONG TERM GOAL #3   Title Pt will perform shower transfer with modified independence.    Baseline Pt requires supervision to min assist for shower transfer at home. 10th visit: supervision; 08/17/21: supv .  Visit 40:  Pt had met goal but had recent fall and now requiring min guard to supv   Time 6    Period Weeks    Status  Met    Target Date 11/08/21      OT LONG TERM GOAL #4   Title Pt will improve L hand grip by 10# to assist with holding items in left hand securely.    Baseline no grip in left hand at eval, 10th visit:  improved flexion but still working towards composite fisting and grip. 20th:  continues to demo decreased grip; 08/17/21: active digit flexion improving, but not yet able to register grip on dynamometer; 09/04/21: L grip 1# 7/17:  5#; 12/25/21: L grip 6#; 02/05/22: 13#   Time 12    Period Weeks    Status On-going    Target Date  01/31/22      OT LONG TERM GOAL #5   Title Pt will improve left shoulder flexion to 100 degrees or better to improve reaching to obtain self care items from shelf/shoulder height.    Baseline difficulty with reach, shoulder flexion to 47 degrees; 08/17/21: L shoulder flexion 85, but not yet able to consistently hold ADL supplies in L hand when reaching; 09/04/21: flexion 85 7/17:  shoulder flexion to 90; 12/25/21: 105 (P 125)    Time 12    Period Weeks  Status On-going    Target Date 01/31/22      OT LONG TERM GOAL #6   Title Pt will improve FOTO score to 47 or above to demonstrate a clinically relevant change in function to impact ADL tasks.    Baseline score of 30 at eval; 08/17/21: FOTO: 42; 09/04/21: FOTO : 54 , FOTO 7/7:  60; 12/25/21: 59   Time 12    Period Weeks    Status  MET/ongoing    Target Date 01/31/22      OT LONG TERM GOAL #7   Title Pt will demonstrate ability to pick up small objects and complete 9 hole peg test in less than 1 min 30 sec (revised from under 2 min)   Baseline unable to perform at eval, 10th visit: still unable to pick up small pegs, 20th: unable to pick up pegs but improving; 08/17/21: not attempted d/t time constraints, will assess next visit; 09/04/21: 9 hole peg test in 5 min 53 sec;  7/17:  Pt unable to complete this date but was able to place 6 of 9 pegs in 4 mins 20 secs; 12/25/21: L 1 min 43 sec; 02/05/22:    Time 12    Period Weeks    Status Revised/On-going    Target Date 01/31/2022             Plan -     Clinical Impression Statement Pt making steady gains with L hand strength.  Pt tolerated 1 set 10 reps with hand gripper with red band (light resistance) and 4 sets of 10 reps with green band (moderate resistance), with intermittent assist to reposition hand gripper in hand to avoid dropping gripper and to engage L SF into gripping pattern.  Pt completed sets throughout session to allow for rest breaks.  Pt able to tolerate all colors but black on  the resistive clothespins, though blue required extra trials.  Pt was able to progress to a more advanced FMC/dexterity task this date working with grooved pegs and pegboard.  Pt not yet able to reposition the pegs within her fingertips but was able to practice picking up the pegs and searching for a groove that matched the way the peg was already positioned within her fingertips.  Pt will continue to work towards goals in plan of care to improve left UE function, decrease pain and increase active normal movement patterns for daily tasks.    OT Occupational Profile and History Detailed Assessment- Review of Records and additional review of physical, cognitive, psychosocial history related to current functional performance    Occupational performance deficits (Please refer to evaluation for details): ADL's;IADL's;Leisure;Rest and Sleep    Body Structure / Function / Physical Skills ADL;Coordination;Endurance;GMC;UE functional use;Balance;IADL;Pain;Dexterity;FMC;Strength;Edema;Mobility;ROM    Psychosocial Skills Environmental  Adaptations;Habits;Routines and Behaviors    Rehab Potential Good    Clinical Decision Making Several treatment options, min-mod task modification necessary   Comorbidities Affecting Occupational Performance: May have comorbidities impacting occupational performance    Modification or Assistance to Complete Evaluation  Min-Moderate modification of tasks or assist with assess necessary to complete eval    OT Frequency 2x / week    OT Duration 12 weeks    OT Treatment/Interventions Self-care/ADL training;Cryotherapy;Paraffin;Therapeutic exercise;DME and/or AE instruction;Functional Mobility Training;Balance training;Electrical Stimulation;Ultrasound;Neuromuscular education;Manual Therapy;Splinting;Moist Heat;Contrast Bath;Passive range of motion;Therapeutic activities;Patient/family education;Coping strategies training    Plan OT recert    Consulted and Agree with Plan of Care Patient  Leta Speller, MS, OTR/L  Darleene Cleaver, OT 03/05/2022, 11:46 AM

## 2022-03-07 ENCOUNTER — Ambulatory Visit: Payer: Medicare PPO

## 2022-03-07 DIAGNOSIS — M6281 Muscle weakness (generalized): Secondary | ICD-10-CM | POA: Diagnosis not present

## 2022-03-07 DIAGNOSIS — I63511 Cerebral infarction due to unspecified occlusion or stenosis of right middle cerebral artery: Secondary | ICD-10-CM

## 2022-03-07 DIAGNOSIS — R278 Other lack of coordination: Secondary | ICD-10-CM

## 2022-03-07 NOTE — Therapy (Signed)
OUTPATIENT OCCUPATIONAL THERAPY NEURO TREATMENT NOTE Patient Name: Teresa [name] MRN: [id number] DOB:1940-04-21, 81 y.o., female Today's Date: 12/13/2021  PCP: Dr. [name] REFERRING PROVIDER: Dr. [name]     OT End of Session - 03/07/22 1618     Visit Number 79    Number of Visits 94    Date for OT Re-Evaluation 04/25/22    Authorization Time Period Reporting period beginning 01/31/22    OT Start Time 1430    OT Stop Time 1515    OT Time Calculation (min) 45 min    Equipment Utilized During Treatment cane    Activity Tolerance Patient tolerated treatment well    Behavior During Therapy WFL for tasks assessed/performed             Past Medical History:  Diagnosis Date   Cancer ([place])    skin   Hypothyroidism    Raynaud's disease    Stroke [facility])    Past Surgical History:  Procedure Laterality Date   [place]  2010   Cervical fusion C4-5-6-7   CATARACT EXTRACTION, BILATERAL Bilateral 10/2016   CHOLECYSTECTOMY  1995   COLONOSCOPY WITH PROPOFOL N/A 12/08/2018   Procedure: COLONOSCOPY WITH PROPOFOL;  Surgeon: Toledo, Benay Pike, MD;  Location: [facility] ENDOSCOPY;  Service: Gastroenterology;  Laterality: N/A;   EYE SURGERY     Patient Active Problem List   Diagnosis Date Noted   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  Following inpt rehab, pt went home and had home  health.   PRECAUTIONS: fall  SUBJECTIVE:  Pt reports doing well today.  A little achy in her L upper trap and upper arm.  PAIN:  Are you having pain? 1-2 L upper trap and upper arm. OBJECTIVE:  L grip 6, R grip 41 L lateral pinch 5, R 13 L 3 point pinch 4, R 14  L 9 hole 1 min 43 sec. L shoulder active flexion 0-105, passive 0-125 02/05/22: L grip 13#  L lateral pinch 8#  3 point pinch 5#  L shoulder flex 0-108  TODAY'S TREATMENT: Therapeutic Exercise:  Performed AAROM with use of shoulder pulley for L shoulder flexion and abd.  Pt demonstrated indep with good form for L shoulder flexion, but required min tactile and vc to keep L arm back toward door and to prevent leaning during abd.  Performed passive stretching for the L shoulder flexion, abd, ER, forearm pron/sup for increasing flexibility to engage the LUE into daily tasks. Facilitated LUE strengthening with use of 1# dumbbell to perform L wrist flex, ext, radial deviation, forearm pron/sup, and elbow flex/ext x2 sets 10 reps each, intermittent tactile cues for wrist and forearm positioning.    Self Care: Practiced use of L hand for holding and carrying ADLs supplies.  Pt filled 4 oz water cup 3/4 full of water and used L hand to transport cup around the gym.  Pt required max vc to keep hand level to avoid spilling, occasional min A to reposition hand on cup for grip adjustment.  Pt is able to maintain level cup when she keeps her eyes on the cup in hand, but when walking through the gym, pt scans the room around her and cup begins to tilt.  Practiced use of L hand to open kitchen cupboards and reached for plate, bowl, and mug from 1st shelf to bring down to countertop, back up to 1st shelf.  Min A to move heavier medium sized bowl to prevent dropping.        Neuro re-ed:  Facilitated L hand [place] and dexterity skills working to scoop up glass [place] stones into palm; practiced storing them in hand and translatory skills by moving  stones from palm to fingertips in prep for discarding.  Pt required min vc to maintain tighter grip with L 4th and 5th digits to keep stones from sliding out ulnar aspect of palm.  Pt did frequently dropped stones, but stated her hand was tired from the other tx activities this session.   PATIENT EDUCATION: Education details: L hand coordination skills Person educated: pt Education method: Explanation and Verbal cues Education comprehension: verbalized understanding   HOME EXERCISE PROGRAM Continue to engage LUE into ADLs; continue gripping and pinching exercises for LUE, and L shoulder AROM/AAROM, crocheting, typing; increase participation in IADL tasks for greater use of L hand.    OT Long Term Goals -       OT LONG TERM GOAL #1   Title Pt will be independent with home exercise program.    Baseline Eval: no current program, 10th visit:  continue to add new exercises as pt progresses, 20th:  continue to update HEP; 08/17/21: continue to progress HEP when indicated.  Visit 40: adding new exercises as pt progresses; 12/25/21: ongoing with progressions   Time 12    Period Weeks    Status On-going    Target Date 01/31/22      OT LONG TERM GOAL #2   Title Pt will complete UB and LB dressing with modified independence including buttons, snaps and zippers.    Baseline requires min assist at eval, 10th visit: occasional assist with buttons, 20th:  able to perform one handed, but difficulty with bilateral UE; 08/17/21: pt reports inconsistent with 1 hand, reviewed techniques this visit, Visit 40:  Pt requires assist with bra   Time 12    Period Weeks    Status MET   Target Date 11/08/21     OT LONG TERM GOAL #3   Title Pt will perform shower transfer with modified independence.    Baseline Pt requires supervision to min assist for shower transfer at home. 10th visit: supervision; 08/17/21: supv .  Visit 40:  Pt had met goal but had recent fall and now requiring min guard to supv   Time 6     Period Weeks    Status  Met    Target Date 11/08/21      OT LONG TERM GOAL #4   Title Pt will improve L hand grip by 10# to assist with holding items in left hand securely.    Baseline no grip in left hand at eval, 10th visit:  improved flexion but still working towards composite fisting and grip. 20th:  continues to demo decreased grip; 08/17/21: active digit flexion improving, but not yet able to register grip on dynamometer; 09/04/21: L grip 1# 7/17:  5#; 12/25/21:  L grip 6#; 02/05/22: 13#   Time 12    Period Weeks    Status On-going    Target Date 01/31/22      OT LONG TERM GOAL #5   Title Pt will improve left shoulder flexion to 100 degrees or better to improve reaching to obtain self care items from shelf/shoulder height.    Baseline difficulty with reach, shoulder flexion to 47 degrees; 08/17/21: L shoulder flexion 85, but not yet able to consistently hold ADL supplies in L hand when reaching; 09/04/21: flexion 85 7/17:  shoulder flexion to 90; 12/25/21: 105 (P 125)    Time 12    Period Weeks    Status On-going    Target Date 01/31/22      OT LONG TERM GOAL #6   Title Pt will improve FOTO score to 47 or above to demonstrate a clinically relevant change in function to impact ADL tasks.    Baseline score of 30 at eval; 08/17/21: FOTO: 42; 09/04/21: FOTO : 54 , FOTO 7/7:  60; 12/25/21: 59   Time 12    Period Weeks    Status  MET/ongoing    Target Date 01/31/22      OT LONG TERM GOAL #7   Title Pt will demonstrate ability to pick up small objects and complete 9 hole peg test in less than 1 min 30 sec (revised from under 2 min)   Baseline unable to perform at eval, 10th visit: still unable to pick up small pegs, 20th: unable to pick up pegs but improving; 08/17/21: not attempted d/t time constraints, will assess next visit; 09/04/21: 9 hole peg test in 5 min 53 sec;  7/17:  Pt unable to complete this date but was able to place 6 of 9 pegs in 4 mins 20 secs; 12/25/21: L 1 min 43 sec; 02/05/22:     Time 12    Period Weeks    Status Revised/On-going    Target Date 01/31/2022             Plan -     Clinical Impression Statement Pt reports that she was out to eat last night and was able to use standard utensils and cut a piece of chicken cutlet, holding the knife in her R hand and stabilizing the meat with a fork in her L hand.  Pt continues to engage the L hand into holding and carrying light weight ADL supplies, but spouse assists to move heavier dishes in and out of the stove for pt.  Pt uses R hand to transport a cup of water at home.  Pt practiced transporting a cup of water with L hand today, and requires max vc to keep eyes on the cup to ensure cup stays level.  When pt's line of vision is not on the cup, cup will tilt.  Pt will continue to work towards goals in plan of care to improve left UE function, decrease pain and increase active normal movement patterns for daily tasks.    OT Occupational Profile and History Detailed Assessment- Review of Records and additional review of physical, cognitive, psychosocial history related to current functional performance    Occupational performance deficits (Please refer to evaluation for details): ADL's;IADL's;Leisure;Rest and Sleep    Body Structure / Function / Physical Skills ADL;Coordination;Endurance;GMC;UE functional use;Balance;IADL;Pain;Dexterity;FMC;Strength;Edema;Mobility;ROM    Psychosocial Skills Environmental  Adaptations;Habits;Routines and Behaviors    Rehab Potential Good    Clinical Decision Making Several treatment options, min-mod task modification necessary  Comorbidities Affecting Occupational Performance: May have comorbidities impacting occupational performance    Modification or Assistance to Complete Evaluation  Min-Moderate modification of tasks or assist with assess necessary to complete eval    OT Frequency 2x / week    OT Duration 12 weeks    OT Treatment/Interventions Self-care/ADL  training;Cryotherapy;Paraffin;Therapeutic exercise;DME and/or AE instruction;Functional Mobility Training;Balance training;Electrical Stimulation;Ultrasound;Neuromuscular education;Manual Therapy;Splinting;Moist Heat;Contrast Bath;Passive range of motion;Therapeutic activities;Patient/family education;Coping strategies training    Plan OT recert    Consulted and Agree with Plan of Care Patient            Leta Speller, MS, OTR/L  Darleene Cleaver, OT 03/07/2022, 4:20 PM

## 2022-03-12 ENCOUNTER — Other Ambulatory Visit: Payer: Self-pay

## 2022-03-12 ENCOUNTER — Ambulatory Visit: Payer: Medicare PPO

## 2022-03-12 DIAGNOSIS — R278 Other lack of coordination: Secondary | ICD-10-CM

## 2022-03-12 DIAGNOSIS — M6281 Muscle weakness (generalized): Secondary | ICD-10-CM

## 2022-03-12 DIAGNOSIS — R2689 Other abnormalities of gait and mobility: Secondary | ICD-10-CM

## 2022-03-12 DIAGNOSIS — I63511 Cerebral infarction due to unspecified occlusion or stenosis of right middle cerebral artery: Secondary | ICD-10-CM

## 2022-03-12 DIAGNOSIS — N393 Stress incontinence (female) (male): Secondary | ICD-10-CM

## 2022-03-12 NOTE — Therapy (Signed)
OUTPATIENT OCCUPATIONAL THERAPY NEURO PROGRESS AND TREATMENT NOTE Reporting period beginning 01/31/22-03/12/22 Patient Name: Teresa [name] MRN: [id number] DOB:January 11, 1941, 81 y.o., female Today's Date: 12/13/2021  PCP: Dr. [name] REFERRING PROVIDER: Dr. [name]   OT End of Session - 03/12/22 1304     Visit Number 80    Number of Visits 94    Date for OT Re-Evaluation 04/25/22    Authorization Time Period Reporting period beginning 01/31/22    OT Start Time 1300    OT Stop Time 1345    OT Time Calculation (min) 45 min    Equipment Utilized During Treatment cane    Activity Tolerance Patient tolerated treatment well    Behavior During Therapy WFL for tasks assessed/performed             Past Medical History:  Diagnosis Date   Cancer ([place])    skin   Hypothyroidism    Raynaud's disease    Stroke [facility])    Past Surgical History:  Procedure Laterality Date   [place]  2010   Cervical fusion C4-5-6-7   CATARACT EXTRACTION, BILATERAL Bilateral 10/2016   CHOLECYSTECTOMY  1995   COLONOSCOPY WITH PROPOFOL N/A 12/08/2018   Procedure: COLONOSCOPY WITH PROPOFOL;  Surgeon: Toledo, Benay Pike, MD;  Location: [facility] ENDOSCOPY;  Service: Gastroenterology;  Laterality: N/A;   EYE SURGERY     Patient Active Problem List   Diagnosis Date Noted   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  Following inpt rehab, pt went home and had home health.   PRECAUTIONS: fall  SUBJECTIVE:  Pt reports use of her TENS unit and heating pad have helped to relax the knot in her L upper arm.  PAIN:  Are you having pain? 0 OBJECTIVE:  L grip 6, R grip 41 L lateral pinch 5, R 13 L 3 point pinch 4, R 14  L 9 hole 1 min 43 sec. L shoulder active flexion 0-105, passive 0-125 02/05/22: L grip 13#  L lateral pinch 8#  3 point pinch 5#  L shoulder flex 0-108 03/12/22:  L grip: 12# L lateral pinch: 8# 3 point pinch: 6# L 9 hole: 1 min 3 sec L shoulder flexion 0-110  TODAY'S TREATMENT: Therapeutic Exercise: Objective measures taken and goals updated for progress note.  Facilitated hand strengthening with use of hand gripper set at 6.6# to remove jumbo pegs from pegboard x2 trials using L hand.  Pt required intermittent cues to engage the L 5th digit into each grasp and to reposition gripper in hand if 5th digit slipped.    Self Care:  Participated in coin manipulation activities with coin pick up from rubber non-skid surface and transferring coins into resistive slotted bank using a lateral and 3 point pinch.  Attempted to pick up a quarter without non-skid surface and without sliding coin to edge of table, but not yet able.  PATIENT EDUCATION: Education details: focus on theraputty exercises daily for increasing L grip and pinch strength Person educated: pt Education method: Explanation and Verbal cues Education comprehension: verbalized understanding   HOME EXERCISE PROGRAM Continue to engage LUE into ADLs; continue gripping and pinching exercises for LUE, and L shoulder AROM/AAROM, crocheting, typing; increase participation in IADL tasks for greater use of L hand.    OT Long Term Goals -       OT LONG TERM GOAL #1   Title Pt will be independent with home exercise program.    Baseline Eval: no current program, 10th visit:  continue to add new exercises as pt  progresses, 20th:  continue to update HEP; 08/17/21: continue to progress HEP when indicated.  Visit 40: adding new exercises as pt progresses; 12/25/21: ongoing with progressions; 03/12/22: inconsistent use of putty but regular use of stress ball (every other day).  Encouraged pt return to daily putty use (2x daily when able) to target grip and pinch strengthening.   Time 12    Period Weeks    Status On-going    Target Date 04/24/22     OT LONG TERM GOAL #2   Title Pt will complete UB and LB dressing with modified independence including buttons, snaps and zippers.    Baseline requires min assist at eval, 10th visit: occasional assist with buttons, 20th:  able to perform one handed, but difficulty with bilateral UE; 08/17/21: pt reports inconsistent with 1 hand, reviewed techniques this visit, Visit 40:  Pt requires assist with bra   Time 12    Period Weeks    Status MET   Target Date 11/08/21     OT LONG TERM GOAL #3   Title Pt will perform shower transfer with modified independence.    Baseline Pt requires supervision to min assist for shower transfer at home. 10th visit: supervision; 08/17/21: supv .  Visit 40:  Pt had met goal but had recent fall and now requiring min guard to supv   Time 6    Period Weeks    Status  Met    Target Date 11/08/21      OT LONG TERM GOAL #4   Title Pt will improve L hand grip by 10# to assist with holding items in left hand securely.    Baseline no grip in left hand at eval, 10th visit:  improved flexion but still working towards composite fisting and grip. 20th:  continues to demo decreased grip; 08/17/21: active digit flexion improving, but not yet able to register grip on dynamometer; 09/04/21: L grip 1# 7/17:  5#; 12/25/21: L grip 6#; 02/05/22: 13#; 03/12/22: L grip 12#   Time 12    Period Weeks    Status On-going    Target Date 04/25/22     OT LONG TERM GOAL #5   Title Pt will improve left shoulder flexion to 100 degrees or better to improve reaching to  obtain self care items from shelf/shoulder height.    Baseline difficulty with reach, shoulder flexion to 47 degrees; 08/17/21: L shoulder flexion 85, but not yet able to consistently hold ADL supplies in L hand when reaching; 09/04/21: flexion 85 7/17:  shoulder flexion to 90; 12/25/21: 105 (P 125); 03/12/22: active L shoulder flexion 110 (pt uses R arm to reach for items at shoulder height or above)    Time 12    Period Weeks    Status On-going    Target Date 04/25/22  OT LONG TERM GOAL #6   Title Pt will improve FOTO score to 47 or above to demonstrate a clinically relevant change in function to impact ADL tasks.    Baseline score of 30 at eval; 08/17/21: FOTO: 42; 09/04/21: FOTO : 54 , FOTO 7/7:  60; 12/25/21: 59; 03/12/22: 66   Time 12    Period Weeks    Status  MET/ongoing    Target Date 04/25/22     OT LONG TERM GOAL #7   Title Pt will demonstrate ability to pick up small objects and complete 9 hole peg test in less than 1 min 30 sec (revised from under 2 min)   Baseline unable to perform at eval, 10th visit: still unable to pick up small pegs, 20th: unable to pick up pegs but improving; 08/17/21: not attempted d/t time constraints, will assess next visit; 09/04/21: 9 hole peg test in 5 min 53 sec;  7/17:  Pt unable to complete this date but was able to place 6 of 9 pegs in 4 mins 20 secs; 12/25/21: L 1 min 43 sec; 03/12/22: L 1 min 2 sec (requires non-skid surface to pick up small items from table top)   Time 12    Period Weeks    Status Revised/On-going    Target Date 04/25/22             Plan -     Clinical Impression Statement Pt seen for progress update.  Pt making steady gains with LUE FMC and dexterity skills, noting significant improvement in 9 hole peg test and pt now able to pick up coins with extra time from a non-skid surface.  FOTO score has improved from 59 to 66 since 12/25/21.  L hand grip and pinch measures with no significant change this period.  OT has recommended that pt  return to daily putty use, 2x daily when able, in order to target increased grip and pinch.  Pt will continue to work towards goals in plan of care to improve left UE function, decrease pain and increase active normal movement patterns for daily tasks.    OT Occupational Profile and History Detailed Assessment- Review of Records and additional review of physical, cognitive, psychosocial history related to current functional performance    Occupational performance deficits (Please refer to evaluation for details): ADL's;IADL's;Leisure;Rest and Sleep    Body Structure / Function / Physical Skills ADL;Coordination;Endurance;GMC;UE functional use;Balance;IADL;Pain;Dexterity;FMC;Strength;Edema;Mobility;ROM    Psychosocial Skills Environmental  Adaptations;Habits;Routines and Behaviors    Rehab Potential Good    Clinical Decision Making Several treatment options, min-mod task modification necessary   Comorbidities Affecting Occupational Performance: May have comorbidities impacting occupational performance    Modification or Assistance to Complete Evaluation  Min-Moderate modification of tasks or assist with assess necessary to complete eval    OT Frequency 2x / week    OT Duration 12 weeks    OT Treatment/Interventions Self-care/ADL training;Cryotherapy;Paraffin;Therapeutic exercise;DME and/or AE instruction;Functional Mobility Training;Balance training;Electrical Stimulation;Ultrasound;Neuromuscular education;Manual Therapy;Splinting;Moist Heat;Contrast Bath;Passive range of motion;Therapeutic activities;Patient/family education;Coping strategies training    Plan OT recert    Consulted and Agree with Plan of Care Patient            Leta Speller, MS, OTR/L  Darleene Cleaver, OT 03/12/2022, 4:36 PM

## 2022-03-12 NOTE — Therapy (Signed)
OUTPATIENT PHYSICAL THERAPY FEMALE PELVIC TREATMENT   Patient Name: Teresa [name] MRN: [id number] DOB:1941-02-09, 81 y.o., female Today's Date: 03/12/2022   PT End of Session - 03/12/22 1147     Visit Number 2    Number of Visits 9    Date for PT Re-Evaluation 04/06/22    Authorization Type [employer] PPO-    Authorization Time Period Cert 11/26/71-07/21/74    Progress Note Due on Visit 10    PT Start Time 1144    PT Stop Time 1225    PT Time Calculation (min) 41 min    Activity Tolerance Patient tolerated treatment well;No increased pain    Behavior During Therapy WFL for tasks assessed/performed             Past Medical History:  Diagnosis Date   Cancer ([place])    skin   Hypothyroidism    Raynaud's disease    Stroke [facility])    Past Surgical History:  Procedure Laterality Date   [place]   BACK SURGERY  2010   Cervical fusion C4-5-6-7   CATARACT EXTRACTION, BILATERAL Bilateral 10/2016   CHOLECYSTECTOMY  1995   COLONOSCOPY WITH PROPOFOL N/A 12/08/2018   Procedure: COLONOSCOPY WITH PROPOFOL;  Surgeon: Toledo, Benay Pike, MD;  Location: [facility] ENDOSCOPY;  Service: Gastroenterology;  Laterality: N/A;   EYE SURGERY     Patient Active Problem List   Diagnosis Date Noted   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   Stroke ([place]) 02/27/2021   History of nonmelanoma skin cancer 05/23/2014    PCP: [name], MD  REFERRING PROVIDER: [name], MD  REFERRING DIAG: SUI, s/p hemiplegia R CVA in 02/2021  THERAPY DIAG:  Muscle weakness (generalized)  Other lack of coordination  SUI (stress urinary incontinence, female)  Other abnormalities of gait and mobility  Rationale for Evaluation and Treatment: Rehabilitation  ONSET DATE: 01/09/22 referral date  SUBJECTIVE:                                                                                                                                                                                            SUBJECTIVE STATEMENT: Pt reported toileting posture and having water before coffee in the morning. Only had two accidents at night (nocturia). Pt is still on generic Vesicare and doing well. Pt denied falls since last visit. Pt bought a bike with a seat back for exercise.    PAIN:  Are you having pain? No NPRS scale: 0/10 Pain location:   Pain type: n/a Pain description:  Aggravating factors:  Relieving factors:   PRECAUTIONS:  Fall  WEIGHT BEARING RESTRICTIONS: No  FALLS:  Has patient fallen in last 6 months? No but broke ankle from falling earlier this year.   PATIENT GOALS: Be able to void right away and not have to wait 5-10 minutes before urine flow begins. Stop leaking with urgency.   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 Raynaud's disease, hysterectomy, cholecystectomy, appendectomy, B cataract excision, cervical fusion in 2010 Sexual abuse: No    OBJECTIVE:     GAIT: Distance walked: 69' Assistive device utilized: Lobbyist (hurrycane) Level of assistance: Modified independence Comments: decr. Stride length, decr. speed  POSTURE: forward head, decr. Tx kyphosis, decr. Lx lordosis, incr. Cx lordosis  PELVIC ALIGNMENT: R side slightly elevated.   LOWER EXTREMITY MMT:  MMT Right eval Left eval  Hip flexion 5/5 4/5  Hip extension    Hip abduction 3/5 2+/5  Hip adduction    Hip internal rotation 4/5 4/5  Hip external rotation 4/5 4/5  Knee flexion 5/5 4/5  Knee extension 5/5 4/5  Ankle dorsiflexion    Ankle plantarflexion    Ankle inversion    Ankle eversion     (Blank rows = not tested)  LOWER EXTREMITY ROM: WFL except for decr. B ankle DF and R hip IR.   MMT Right eval Left eval  Hip flexion    Hip extension    Hip abduction    Hip adduction    Hip internal rotation    Hip external rotation    Knee flexion    Knee extension     Ankle dorsiflexion    Ankle plantarflexion    Ankle inversion    Ankle eversion       NMR: Access Code: [email or web address] URL: [email or web address] Date: 03/12/2022 Prepared by: Geoffry Paradise  Exercises - Supine Diaphragmatic Breathing  - 1 x daily - 7 x weekly - 2 sets - 5 reps - Supine Pelvic Floor Contraction  - 1-2 x daily - 7 x weekly - 1 sets - 10 reps and 5 reps with 5 sec. Hold. Extensive cues plus demo for technique especially to decr. B hip add activation and to not hold breath. S for safety.  PALPATION:                External Perineal Exam: no TTP.   PELVIC MMT:   MMT eval  Vaginal   Internal Anal Sphincter   External Anal Sphincter   Puborectalis   Diastasis Recti None noted  (Blank rows = not tested)  Pt had difficulty palpating PFM contraction externally but could visualize TrA activation when pt stated she was performing contraction.     PATIENT EDUCATION:  Education details: PT discussed exam findings and HEP. Person educated: Patient Education method: Explanation, Demonstration, Verbal cues, and Handouts Education comprehension: verbalized understanding, returned demonstration, and needs further education  HOME EXERCISE PROGRAM: Medbridge: [email or web address]  ASSESSMENT:  CLINICAL IMPRESSION: Today's skilled session focused on completing exam and establishing HEP. Pt continues to experience the following impairments:urgency urinary incontinence (although less frequent since last visit), decr. ROM, gait deviations, impaired balance, impaired posture, decr. Flexibility and decr. R hip IR ROM with pain noted today, weak B hip abd. And difficulty coordinating PFM contraction with breath. No diastasis recti noted but pt stated she has hiatal hernia. Pt would continue to benefit from skilled PT to improve safety during all functional mobility.  OBJECTIVE IMPAIRMENTS: Abnormal gait, decreased activity tolerance, decreased balance, decreased  coordination,  decreased endurance, decreased knowledge of use of DME, decreased mobility, decreased ROM, decreased strength, hypomobility, impaired flexibility, impaired sensation, impaired UE functional use, and postural dysfunction.   ACTIVITY LIMITATIONS: carrying, lifting, bending, standing, squatting, sleeping, transfers, bed mobility, continence, toileting, and locomotion level  PARTICIPATION LIMITATIONS: meal prep, cleaning, laundry, interpersonal relationship, driving, shopping, and community activity  PERSONAL FACTORS: Age, Time since onset of injury/illness/exacerbation, and 3+ comorbidities: see above.  are also affecting patient's functional outcome.   REHAB POTENTIAL: Good  CLINICAL DECISION MAKING: Evolving/moderate complexity hx of CVA.  EVALUATION COMPLEXITY: Moderate   GOALS: Goals reviewed with patient? Yes  SHORT TERM GOALS: Target date: 04/09/2022  Pt will be IND in initial HEP to improve strength, coordination and flexility. Baseline: not established Goal status: INITIAL  2.  Pt will demonstrate proper toileting posture IND in order to fully empty bladder and reduce strain during bowel movement. Baseline: not performing Goal status: INITIAL  3.  Perform FOTO and write goals as indicated. Baseline:  not performed Goal status: INITIAL  4.  Complete spine, pelvic floor muscle (PFM) exam and write goals as indicated. Baseline:  Goal status: INITIAL    LONG TERM GOALS: Target date: 05/07/2022   Pt will report no leakage over the last 4 weeks to improve QOL and improve safety during all ADLs. Baseline: urgency incontinence last week Goal status: INITIAL  2.  Pt will improve coordination of PFM and incr. Hip/LE strength in order to reduce urgency incontinece. Baseline: difficulty coordinating PFM and hip/LE weakness. Goal status: INITIAL  3.  Pt will demonstrate proper posture in seated and standing and walking in order to improve coordination of PFM during all  activities to reduce leakage and improve safety during functional mobility. Baseline: forward head, decr. Tx kyphosis, decr. Lx lordosis, incr. Cx lordosis, pelvic obliquity Goal status: INITIAL  PLAN:  PT FREQUENCY: 1x/week  PT DURATION: 8 weeks  PLANNED INTERVENTIONS: Therapeutic exercises, Therapeutic activity, Neuromuscular re-education, Balance training, Gait training, Patient/Family education, Self Care, Joint mobilization, DME instructions, Dry Needling, Spinal mobilization, scar mobilization, Biofeedback, Manual therapy, and Re-evaluation  PLAN FOR NEXT SESSION: review HEP and add hip strengthening.   Vinetta Brach L, PT 03/12/2022, 11:47 AM  Geoffry Paradise, PT,DPT 03/12/22 11:47 AM Phone: [phone] Fax: [phone]

## 2022-03-14 ENCOUNTER — Ambulatory Visit: Payer: Medicare PPO

## 2022-03-14 DIAGNOSIS — M6281 Muscle weakness (generalized): Secondary | ICD-10-CM

## 2022-03-14 DIAGNOSIS — R278 Other lack of coordination: Secondary | ICD-10-CM

## 2022-03-14 DIAGNOSIS — I63511 Cerebral infarction due to unspecified occlusion or stenosis of right middle cerebral artery: Secondary | ICD-10-CM

## 2022-03-14 NOTE — Therapy (Signed)
OUTPATIENT OCCUPATIONAL THERAPY NEURO TREATMENT NOTE  Patient Name: Teresa [name] MRN: [id number] DOB:April 21, 1940, 81 y.o., female Today's Date: 12/13/2021  PCP: Dr. [name] REFERRING PROVIDER: Dr. [name]   OT End of Session - 03/14/22 1523     Visit Number 81    Number of Visits 48    Date for OT Re-Evaluation 04/25/22    Authorization Time Period Reporting period beginning 03/12/22    OT Start Time 1145    OT Stop Time 1230    OT Time Calculation (min) 45 min    Equipment Utilized During Treatment cane    Activity Tolerance Patient tolerated treatment well    Behavior During Therapy WFL for tasks assessed/performed             Past Medical History:  Diagnosis Date   Cancer ([place])    skin   Hypothyroidism    Raynaud's disease    Stroke [facility])    Past Surgical History:  Procedure Laterality Date   [place]  2010   Cervical fusion C4-5-6-7   CATARACT EXTRACTION, BILATERAL Bilateral 10/2016   CHOLECYSTECTOMY  1995   COLONOSCOPY WITH PROPOFOL N/A 12/08/2018   Procedure: COLONOSCOPY WITH PROPOFOL;  Surgeon: Toledo, Benay Pike, MD;  Location: [facility] ENDOSCOPY;  Service: Gastroenterology;  Laterality: N/A;   EYE SURGERY     Patient Active Problem List   Diagnosis Date Noted   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   Stroke ([place]) 02/27/2021   History of nonmelanoma skin cancer 05/23/2014   ONSET DATE: 02/26/2021  REFERRING DIAG: R MCA CVA  THERAPY DIAG:  Muscle weakness (generalized)  Other lack of coordination  Right middle cerebral artery stroke [facility])  Rationale for Evaluation and Treatment Rehabilitation  PERTINENT HISTORY: February 26, 2021, pt reports she had a CVA, came to [facility] to ER and then was transferred to [facility] in [place] where she was admitted and after acute care she went to inpatient rehab.  Following inpt rehab, pt went home and had home  health.   PRECAUTIONS: fall  SUBJECTIVE:  Pt reports she spent the day in the kitchen yesterday making cookies.  Spouse helped to roll the dough but pt found other ways to engage the L hand.  PAIN:  Are you having pain? 0 OBJECTIVE:  L grip 6, R grip 41 L lateral pinch 5, R 13 L 3 point pinch 4, R 14  L 9 hole 1 min 43 sec. L shoulder active flexion 0-105, passive 0-125 02/05/22: L grip 13#  L lateral pinch 8#  3 point pinch 5#  L shoulder flex 0-108 03/12/22:  L grip: 12# L lateral pinch: 8# 3 point pinch: 6# L 9 hole: 1 min 3 sec L shoulder flexion 0-110  TODAY'S TREATMENT: Therapeutic Exercise:  Facilitated LUE strengthening with 1# weight for L wrist flex/ext/RD/UD, forearm pron/sup, and elbow flex/ext x3 sets 10 reps each with intermittent min guard for form/technique.  Pt required intermittent vc to keep L 5th digit engaged in gripping the 1# weight.  Pt performed active assisted L shoulder flexion and abd, assisted to achieve end range for 3 sets 10 reps each.  Self Care: Participation in loading plate with light food items and transporting plate of food around the clinic using L hand.  Pt required close supv/guarding with plate in hand with only intermittent CGA to ensure plate was securely held.  Pt required frequent/mod vc  to keep plate level to prevent dropping food while walking.    PATIENT EDUCATION: Education details: focus on theraputty exercises daily for increasing L grip and pinch strength Person educated: pt Education method: Explanation and Verbal cues Education comprehension: verbalized understanding   HOME EXERCISE PROGRAM Continue to engage LUE into ADLs; continue gripping and pinching exercises for LUE, and L shoulder AROM/AAROM, crocheting, typing; increase participation in IADL tasks for greater use of L hand.    OT Long Term Goals -       OT LONG TERM GOAL #1   Title Pt will be independent with home exercise program.    Baseline Eval: no  current program, 10th visit:  continue to add new exercises as pt progresses, 20th:  continue to update HEP; 08/17/21: continue to progress HEP when indicated.  Visit 40: adding new exercises as pt progresses; 12/25/21: ongoing with progressions; 03/12/22: inconsistent use of putty but regular use of stress ball (every other day).  Encouraged pt return to daily putty use (2x daily when able) to target grip and pinch strengthening.   Time 12    Period Weeks    Status On-going    Target Date 04/24/22     OT LONG TERM GOAL #2   Title Pt will complete UB and LB dressing with modified independence including buttons, snaps and zippers.    Baseline requires min assist at eval, 10th visit: occasional assist with buttons, 20th:  able to perform one handed, but difficulty with bilateral UE; 08/17/21: pt reports inconsistent with 1 hand, reviewed techniques this visit, Visit 40:  Pt requires assist with bra   Time 12    Period Weeks    Status MET   Target Date 11/08/21     OT LONG TERM GOAL #3   Title Pt will perform shower transfer with modified independence.    Baseline Pt requires supervision to min assist for shower transfer at home. 10th visit: supervision; 08/17/21: supv .  Visit 40:  Pt had met goal but had recent fall and now requiring min guard to supv   Time 6    Period Weeks    Status  Met    Target Date 11/08/21      OT LONG TERM GOAL #4   Title Pt will improve L hand grip by 10# to assist with holding items in left hand securely.    Baseline no grip in left hand at eval, 10th visit:  improved flexion but still working towards composite fisting and grip. 20th:  continues to demo decreased grip; 08/17/21: active digit flexion improving, but not yet able to register grip on dynamometer; 09/04/21: L grip 1# 7/17:  5#; 12/25/21: L grip 6#; 02/05/22: 13#; 03/12/22: L grip 12#   Time 12    Period Weeks    Status On-going    Target Date 04/25/22     OT LONG TERM GOAL #5   Title Pt will improve left  shoulder flexion to 100 degrees or better to improve reaching to obtain self care items from shelf/shoulder height.    Baseline difficulty with reach, shoulder flexion to 47 degrees; 08/17/21: L shoulder flexion 85, but not yet able to consistently hold ADL supplies in L hand when reaching; 09/04/21: flexion 85 7/17:  shoulder flexion to 90; 12/25/21: 105 (P 125); 03/12/22: active L shoulder flexion 110 (pt uses R arm to reach for items at shoulder height or above)    Time 12    Period Weeks  Status On-going    Target Date 04/25/22     OT LONG TERM GOAL #6   Title Pt will improve FOTO score to 47 or above to demonstrate a clinically relevant change in function to impact ADL tasks.    Baseline score of 30 at eval; 08/17/21: FOTO: 42; 09/04/21: FOTO : 54 , FOTO 7/7:  60; 12/25/21: 59; 03/12/22: 66   Time 12    Period Weeks    Status  MET/ongoing    Target Date 04/25/22     OT LONG TERM GOAL #7   Title Pt will demonstrate ability to pick up small objects and complete 9 hole peg test in less than 1 min 30 sec (revised from under 2 min)   Baseline unable to perform at eval, 10th visit: still unable to pick up small pegs, 20th: unable to pick up pegs but improving; 08/17/21: not attempted d/t time constraints, will assess next visit; 09/04/21: 9 hole peg test in 5 min 53 sec;  7/17:  Pt unable to complete this date but was able to place 6 of 9 pegs in 4 mins 20 secs; 12/25/21: L 1 min 43 sec; 03/12/22: L 1 min 2 sec (requires non-skid surface to pick up small items from table top)   Time 12    Period Weeks    Status Revised/On-going    Target Date 04/25/22             Plan -     Clinical Impression Statement Pt continues to make progress with LUE strengthening, demonstrating increased control of 1# weight through ROM arcs for the wrist, forearm, and elbow.  Pt requires intermittent cues to keep L 5th digit engaged in gripping the weight.  Participation in loading plate with light food items and  transporting plate of food around the clinic using L hand.  Pt required close supv/guarding with plate in hand with only intermittent CGA to ensure plate was securely held.  Pt required frequent/mod vc to keep plate level to prevent dropping food while walking.  Spouse reports that pt frequently drops napkins at home when carrying in L hand while dual tasking.  OT encouraged spouse to provide cues for pt to look at her hand when carrying items to keep from dropping.  Pt will continue to work towards goals in plan of care to improve left UE function, decrease pain and increase active normal movement patterns for daily tasks.    OT Occupational Profile and History Detailed Assessment- Review of Records and additional review of physical, cognitive, psychosocial history related to current functional performance    Occupational performance deficits (Please refer to evaluation for details): ADL's;IADL's;Leisure;Rest and Sleep    Body Structure / Function / Physical Skills ADL;Coordination;Endurance;GMC;UE functional use;Balance;IADL;Pain;Dexterity;FMC;Strength;Edema;Mobility;ROM    Psychosocial Skills Environmental  Adaptations;Habits;Routines and Behaviors    Rehab Potential Good    Clinical Decision Making Several treatment options, min-mod task modification necessary   Comorbidities Affecting Occupational Performance: May have comorbidities impacting occupational performance    Modification or Assistance to Complete Evaluation  Min-Moderate modification of tasks or assist with assess necessary to complete eval    OT Frequency 2x / week    OT Duration 12 weeks    OT Treatment/Interventions Self-care/ADL training;Cryotherapy;Paraffin;Therapeutic exercise;DME and/or AE instruction;Functional Mobility Training;Balance training;Electrical Stimulation;Ultrasound;Neuromuscular education;Manual Therapy;Splinting;Moist Heat;Contrast Bath;Passive range of motion;Therapeutic activities;Patient/family education;Coping  strategies training    Plan OT recert    Consulted and Agree with Plan of Care Patient  Leta Speller, MS, OTR/L  Darleene Cleaver, OT 03/14/2022, 3:32 PM

## 2022-03-19 ENCOUNTER — Ambulatory Visit: Payer: Medicare PPO

## 2022-03-19 ENCOUNTER — Ambulatory Visit (INDEPENDENT_AMBULATORY_CARE_PROVIDER_SITE_OTHER): Payer: Medicare PPO

## 2022-03-19 DIAGNOSIS — M6281 Muscle weakness (generalized): Secondary | ICD-10-CM

## 2022-03-19 DIAGNOSIS — R278 Other lack of coordination: Secondary | ICD-10-CM

## 2022-03-19 DIAGNOSIS — I639 Cerebral infarction, unspecified: Secondary | ICD-10-CM | POA: Diagnosis not present

## 2022-03-19 DIAGNOSIS — I63511 Cerebral infarction due to unspecified occlusion or stenosis of right middle cerebral artery: Secondary | ICD-10-CM

## 2022-03-19 LAB — CUP PACEART REMOTE DEVICE CHECK
Date Time Interrogation Session: 20231225231322
Implantable Pulse Generator Implant Date: 19440924

## 2022-03-19 NOTE — Therapy (Signed)
OUTPATIENT OCCUPATIONAL THERAPY NEURO TREATMENT NOTE  Patient Name: Teresa [name] MRN: [id number] DOB:1941/02/27, 81 y.o., female Today's Date: 12/13/2021  PCP: Dr. [name] REFERRING PROVIDER: Dr. [name]   OT End of Session - 03/19/22 1308     Visit Number [id number]    Number of Visits 10    Date for OT Re-Evaluation 04/25/22    Authorization Time Period Reporting period beginning 03/12/22    OT Start Time 1145    OT Stop Time 1230    OT Time Calculation (min) 45 min    Equipment Utilized During Treatment cane    Activity Tolerance Patient tolerated treatment well    Behavior During Therapy WFL for tasks assessed/performed             Past Medical History:  Diagnosis Date   Cancer ([place])    skin   Hypothyroidism    Raynaud's disease    Stroke [facility])    Past Surgical History:  Procedure Laterality Date   [place]  2010   Cervical fusion C4-5-6-7   CATARACT EXTRACTION, BILATERAL Bilateral 10/2016   CHOLECYSTECTOMY  1995   COLONOSCOPY WITH PROPOFOL N/A 12/08/2018   Procedure: COLONOSCOPY WITH PROPOFOL;  Surgeon: Toledo, Benay Pike, MD;  Location: [facility] ENDOSCOPY;  Service: Gastroenterology;  Laterality: N/A;   EYE SURGERY     Patient Active Problem List   Diagnosis Date Noted   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  Following inpt rehab, pt went home and had home  health.   PRECAUTIONS: fall  SUBJECTIVE:  Pt reports she had a good holiday but is feeling overly tired today from all of the holiday gatherings.   PAIN:  Are you having pain? 0 OBJECTIVE:  L grip 6, R grip 41 L lateral pinch 5, R 13 L 3 point pinch 4, R 14  L 9 hole 1 min 43 sec. L shoulder active flexion 0-105, passive 0-125 02/05/22: L grip 13#  L lateral pinch 8#  3 point pinch 5#  L shoulder flex 0-108 03/12/22:  L grip: 12# L lateral pinch: 8# 3 point pinch: 6# L 9 hole: 1 min 3 sec L shoulder flexion 0-110  TODAY'S TREATMENT: Therapeutic Exercise: Performed passive stretching at L shoulder for flexion, abd, ER, horiz add/abd, forearm pron/sup, and wrist ext, working to increase shoulder mobility for UB ADLs and overhead reaching.  Facilitated L grip strengthening with hand gripper set at min resistance with 1 red band to complete 3 sets 10 reps.  Intermittent min A to reposition hand on hand gripper to prevent dropping gripper from hand.  Rest between sets.  Neuro re-ed: Facilitated L hand FMC/dexterity skills working to place small pegs into pegboard.  Pt struggled initially so OT provided "warm up" and OT placed pegs into board for pt to remove using 3 point pinch and lateral pinch patterns.  Pt then practiced placing pegs from  a non-skid surface into the pegboard using 3 point pinch and was more successful after warm up and intermittent breaks to warm hand.  Pt required cues for 3 point pinch pattern and cues to utilize normalized movement patterns.  Pt was cued to rest elbow and forearm on table top when working with coordination activities with the L hand.    PATIENT EDUCATION: Education details: L hand coordination skills Person educated: pt Education method: Explanation and Verbal cues Education comprehension: verbalized understanding   HOME EXERCISE PROGRAM Continue to engage LUE into ADLs; continue gripping and pinching exercises for LUE, and L shoulder  AROM/AAROM, crocheting, typing; increase participation in IADL tasks for greater use of L hand.    OT Long Term Goals -       OT LONG TERM GOAL #1   Title Pt will be independent with home exercise program.    Baseline Eval: no current program, 10th visit:  continue to add new exercises as pt progresses, 20th:  continue to update HEP; 08/17/21: continue to progress HEP when indicated.  Visit 40: adding new exercises as pt progresses; 12/25/21: ongoing with progressions; 03/12/22: inconsistent use of putty but regular use of stress ball (every other day).  Encouraged pt return to daily putty use (2x daily when able) to target grip and pinch strengthening.   Time 12    Period Weeks    Status On-going    Target Date 04/24/22     OT LONG TERM GOAL #2   Title Pt will complete UB and LB dressing with modified independence including buttons, snaps and zippers.    Baseline requires min assist at eval, 10th visit: occasional assist with buttons, 20th:  able to perform one handed, but difficulty with bilateral UE; 08/17/21: pt reports inconsistent with 1 hand, reviewed techniques this visit, Visit 40:  Pt requires assist with bra   Time 12    Period Weeks    Status MET   Target Date 11/08/21     OT LONG TERM GOAL #3   Title Pt will perform shower transfer with modified independence.    Baseline Pt requires supervision to min assist for shower transfer at home. 10th visit: supervision; 08/17/21: supv .  Visit 40:  Pt had met goal but had recent fall and now requiring min guard to supv   Time 6    Period Weeks    Status  Met    Target Date 11/08/21      OT LONG TERM GOAL #4   Title Pt will improve L hand grip by 10# to assist with holding items in left hand securely.    Baseline no grip in left hand at eval, 10th visit:  improved flexion but still working towards composite fisting and grip. 20th:  continues to demo decreased grip; 08/17/21: active digit flexion improving, but not yet able to register  grip on dynamometer; 09/04/21: L grip 1# 7/17:  5#; 12/25/21: L grip 6#; 02/05/22: 13#; 03/12/22: L grip 12#   Time 12    Period Weeks    Status On-going    Target Date 04/25/22     OT LONG TERM GOAL #5   Title Pt will improve left shoulder flexion to 100 degrees or better to improve reaching to obtain self care items from shelf/shoulder height.    Baseline difficulty with reach, shoulder flexion to 47 degrees; 08/17/21: L shoulder flexion 85, but not yet able to consistently hold ADL supplies in L hand when reaching; 09/04/21: flexion  85 7/17:  shoulder flexion to 90; 12/25/21: 105 (P 125); 03/12/22: active L shoulder flexion 110 (pt uses R arm to reach for items at shoulder height or above)    Time 12    Period Weeks    Status On-going    Target Date 04/25/22     OT LONG TERM GOAL #6   Title Pt will improve FOTO score to 47 or above to demonstrate a clinically relevant change in function to impact ADL tasks.    Baseline score of 30 at eval; 08/17/21: FOTO: 42; 09/04/21: FOTO : 54 , FOTO 7/7:  60; 12/25/21: 59; 03/12/22: 66   Time 12    Period Weeks    Status  MET/ongoing    Target Date 04/25/22     OT LONG TERM GOAL #7   Title Pt will demonstrate ability to pick up small objects and complete 9 hole peg test in less than 1 min 30 sec (revised from under 2 min)   Baseline unable to perform at eval, 10th visit: still unable to pick up small pegs, 20th: unable to pick up pegs but improving; 08/17/21: not attempted d/t time constraints, will assess next visit; 09/04/21: 9 hole peg test in 5 min 53 sec;  7/17:  Pt unable to complete this date but was able to place 6 of 9 pegs in 4 mins 20 secs; 12/25/21: L 1 min 43 sec; 03/12/22: L 1 min 2 sec (requires non-skid surface to pick up small items from table top)   Time 12    Period Weeks    Status Revised/On-going    Target Date 04/25/22             Plan -     Clinical Impression Statement  Pt reports she had a good holiday but is feeling overly tired  today from all of the holiday gatherings.  Pt tolerated all therapeutic exercises and activities well with frequent rest breaks.  Facilitated L hand FMC/dexterity skills working to place small pegs into pegboard.  Pt struggled initially so OT provided "warm up" and OT placed pegs into board for pt to remove using 3 point pinch and lateral pinch patterns.  Pt then practiced placing pegs from a non-skid surface into the pegboard using 3 point pinch and was more successful after warm up and intermittent breaks to warm hand.  Pt required cues for 3 point pinch pattern and cues to utilize normalized movement patterns.  Pt was cued to rest elbow and forearm on table top when working with coordination activities with the L hand.  Pt will continue to work towards goals in plan of care to improve left UE function, decrease pain and increase active normal movement patterns for daily tasks.    OT Occupational Profile and History Detailed Assessment- Review of Records and additional review of physical, cognitive, psychosocial history related to current functional performance    Occupational performance deficits (Please refer to evaluation for details): ADL's;IADL's;Leisure;Rest and Sleep    Body Structure / Function / Physical Skills ADL;Coordination;Endurance;GMC;UE functional use;Balance;IADL;Pain;Dexterity;FMC;Strength;Edema;Mobility;ROM    Psychosocial Skills Environmental  Adaptations;Habits;Routines and Behaviors    Rehab Potential Good    Clinical Decision Making Several treatment options, min-mod task modification necessary   Comorbidities Affecting Occupational Performance: May have comorbidities impacting occupational performance    Modification or Assistance to Complete Evaluation  Min-Moderate modification of tasks or assist with assess necessary to complete eval    OT Frequency 2x / week    OT Duration  12 weeks    OT Treatment/Interventions Self-care/ADL training;Cryotherapy;Paraffin;Therapeutic  exercise;DME and/or AE instruction;Functional Mobility Training;Balance training;Electrical Stimulation;Ultrasound;Neuromuscular education;Manual Therapy;Splinting;Moist Heat;Contrast Bath;Passive range of motion;Therapeutic activities;Patient/family education;Coping strategies training    Plan OT recert    Consulted and Agree with Plan of Care Patient            Leta Speller, MS, OTR/L  Darleene Cleaver, OT 03/19/2022, 1:11 PM

## 2022-03-21 ENCOUNTER — Ambulatory Visit: Payer: Medicare PPO

## 2022-03-21 DIAGNOSIS — M6281 Muscle weakness (generalized): Secondary | ICD-10-CM | POA: Diagnosis not present

## 2022-03-21 DIAGNOSIS — R278 Other lack of coordination: Secondary | ICD-10-CM

## 2022-03-21 DIAGNOSIS — I63511 Cerebral infarction due to unspecified occlusion or stenosis of right middle cerebral artery: Secondary | ICD-10-CM

## 2022-03-22 NOTE — Therapy (Signed)
OUTPATIENT OCCUPATIONAL THERAPY NEURO TREATMENT NOTE  Patient Name: Teresa [name] MRN: [id number] DOB:09-11-1940, 81 y.o., female Today's Date: 12/13/2021  PCP: Dr. [name] REFERRING PROVIDER: Dr. [name]   OT End of Session - 03/22/22 1236     Visit Number 83    Number of Visits 94    Date for OT Re-Evaluation 04/25/22    Authorization Time Period Reporting period beginning 03/12/22    OT Start Time 1145    OT Stop Time 1230    OT Time Calculation (min) 45 min    Equipment Utilized During Treatment cane    Activity Tolerance Patient tolerated treatment well    Behavior During Therapy WFL for tasks assessed/performed             Past Medical History:  Diagnosis Date   Cancer ([place])    skin   Hypothyroidism    Raynaud's disease    Stroke [facility])    Past Surgical History:  Procedure Laterality Date   [place]  2010   Cervical fusion C4-5-6-7   CATARACT EXTRACTION, BILATERAL Bilateral 10/2016   CHOLECYSTECTOMY  1995   COLONOSCOPY WITH PROPOFOL N/A 12/08/2018   Procedure: COLONOSCOPY WITH PROPOFOL;  Surgeon: Toledo, Benay Pike, MD;  Location: [facility] ENDOSCOPY;  Service: Gastroenterology;  Laterality: N/A;   EYE SURGERY     Patient Active Problem List   Diagnosis Date Noted   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   Stroke ([place]) 02/27/2021   History of nonmelanoma skin cancer 05/23/2014   ONSET DATE: 02/26/2021  REFERRING DIAG: R MCA CVA  THERAPY DIAG:  Muscle weakness (generalized)  Other lack of coordination  Right middle cerebral artery stroke [facility])  Rationale for Evaluation and Treatment Rehabilitation  PERTINENT HISTORY: February 26, 2021, pt reports she had a CVA, came to [facility] to ER and then was transferred to [facility] in [place] where she was admitted and after acute care she went to inpatient rehab.  Following inpt rehab, pt went home and had home  health.   PRECAUTIONS: fall  SUBJECTIVE:  Pt reports she took a 3 hour long nap the other day and is feeling good about catching up on some rest from the holidays.    PAIN:  Are you having pain? 0 OBJECTIVE:  L grip 6, R grip 41 L lateral pinch 5, R 13 L 3 point pinch 4, R 14  L 9 hole 1 min 43 sec. L shoulder active flexion 0-105, passive 0-125 02/05/22: L grip 13#  L lateral pinch 8#  3 point pinch 5#  L shoulder flex 0-108 03/12/22:  L grip: 12# L lateral pinch: 8# 3 point pinch: 6# L 9 hole: 1 min 3 sec L shoulder flexion 0-110  TODAY'S TREATMENT: Therapeutic Exercise: Performed passive stretching at L shoulder for flexion, abd, ER, horiz add/abd, forearm pron/sup, and wrist ext, working to increase shoulder mobility for UB ADLs and overhead reaching.  Facilitated L grip strengthening with hand gripper set at min resistance with 1 red band to complete 6 sets 10 reps spread throughout duration of session.  Intermittent min A to reposition hand on hand gripper to prevent dropping gripper from hand and to ensure L IF was engaged.  Rest between sets.  Pt completed resistive exercises for LUE, including 1# weight for L wrist flex/ext/RD, forearm pron/sup, elbow flex/ext, shoulder flex and abd for 2 sets 10 reps each.  Pt  required intermittent tactile cues for form and technique.  Completed 1 additional set of above using a 2# weight, requiring intermittent min guard and tactile cues to maintain form and technique.   Neuro re-ed: Facilitated L hand FMC/dexterity skills working to place small pegs into pegboard.  Pt struggled initially so OT provided "warm up" and OT placed pegs into board for pt to remove using 3 point pinch and lateral pinch patterns.  Pt then practiced placing pegs without a non-skid surface into the pegboard using 3 point pinch and was more successful after warm up.  Pt required cues for 3 point pinch pattern and cues to utilize normalized movement patterns.  Pt was  cued to rest elbow and forearm on table top when working with coordination activities with the L hand.    PATIENT EDUCATION: Education details: L hand coordination skills Person educated: pt Education method: Explanation and Verbal cues Education comprehension: verbalized understanding   HOME EXERCISE PROGRAM Continue to engage LUE into ADLs; continue gripping and pinching exercises for LUE, and L shoulder AROM/AAROM, crocheting, typing; increase participation in IADL tasks for greater use of L hand.    OT Long Term Goals -       OT LONG TERM GOAL #1   Title Pt will be independent with home exercise program.    Baseline Eval: no current program, 10th visit:  continue to add new exercises as pt progresses, 20th:  continue to update HEP; 08/17/21: continue to progress HEP when indicated.  Visit 40: adding new exercises as pt progresses; 12/25/21: ongoing with progressions; 03/12/22: inconsistent use of putty but regular use of stress ball (every other day).  Encouraged pt return to daily putty use (2x daily when able) to target grip and pinch strengthening.   Time 12    Period Weeks    Status On-going    Target Date 04/24/22     OT LONG TERM GOAL #2   Title Pt will complete UB and LB dressing with modified independence including buttons, snaps and zippers.    Baseline requires min assist at eval, 10th visit: occasional assist with buttons, 20th:  able to perform one handed, but difficulty with bilateral UE; 08/17/21: pt reports inconsistent with 1 hand, reviewed techniques this visit, Visit 40:  Pt requires assist with bra   Time 12    Period Weeks    Status MET   Target Date 11/08/21     OT LONG TERM GOAL #3   Title Pt will perform shower transfer with modified independence.    Baseline Pt requires supervision to min assist for shower transfer at home. 10th visit: supervision; 08/17/21: supv .  Visit 40:  Pt had met goal but had recent fall and now requiring min guard to supv   Time 6     Period Weeks    Status  Met    Target Date 11/08/21      OT LONG TERM GOAL #4   Title Pt will improve L hand grip by 10# to assist with holding items in left hand securely.    Baseline no grip in left hand at eval, 10th visit:  improved flexion but still working towards composite fisting and grip. 20th:  continues to demo decreased grip; 08/17/21: active digit flexion improving, but not yet able to register grip on dynamometer; 09/04/21: L grip 1# 7/17:  5#; 12/25/21: L grip 6#; 02/05/22: 13#; 03/12/22: L grip 12#   Time 12    Period Weeks  Status On-going    Target Date 04/25/22     OT LONG TERM GOAL #5   Title Pt will improve left shoulder flexion to 100 degrees or better to improve reaching to obtain self care items from shelf/shoulder height.    Baseline difficulty with reach, shoulder flexion to 47 degrees; 08/17/21: L shoulder flexion 85, but not yet able to consistently hold ADL supplies in L hand when reaching; 09/04/21: flexion 85 7/17:  shoulder flexion to 90; 12/25/21: 105 (P 125); 03/12/22: active L shoulder flexion 110 (pt uses R arm to reach for items at shoulder height or above)    Time 12    Period Weeks    Status On-going    Target Date 04/25/22     OT LONG TERM GOAL #6   Title Pt will improve FOTO score to 47 or above to demonstrate a clinically relevant change in function to impact ADL tasks.    Baseline score of 30 at eval; 08/17/21: FOTO: 42; 09/04/21: FOTO : 54 , FOTO 7/7:  60; 12/25/21: 59; 03/12/22: 66   Time 12    Period Weeks    Status  MET/ongoing    Target Date 04/25/22     OT LONG TERM GOAL #7   Title Pt will demonstrate ability to pick up small objects and complete 9 hole peg test in less than 1 min 30 sec (revised from under 2 min)   Baseline unable to perform at eval, 10th visit: still unable to pick up small pegs, 20th: unable to pick up pegs but improving; 08/17/21: not attempted d/t time constraints, will assess next visit; 09/04/21: 9 hole peg test in 5 min 53  sec;  7/17:  Pt unable to complete this date but was able to place 6 of 9 pegs in 4 mins 20 secs; 12/25/21: L 1 min 43 sec; 03/12/22: L 1 min 2 sec (requires non-skid surface to pick up small items from table top)   Time 12    Period Weeks    Status Revised/On-going    Target Date 04/25/22             Plan -     Clinical Impression Statement  Pt continues to progress with LUE strength and coordination.  Pt tolerated 6 sets on the hand gripper today with min resistance, requiring less support to engage the L SF.  Pt was able to progress resistance from 1# weight to 2# weight for L wrist, forearm, elbow, and shoulder strength with intermittent min guard and tactile cues for form and technique.  Pt was able to pick up small pegs today without a non-skid surface, but requires multiple trials to pick up peg with good prehension as pt struggles to reposition the pegs in fingertips once in hand.  Pt will continue to work towards goals in plan of care to improve left UE function, decrease pain and increase active normal movement patterns for daily tasks.    OT Occupational Profile and History Detailed Assessment- Review of Records and additional review of physical, cognitive, psychosocial history related to current functional performance    Occupational performance deficits (Please refer to evaluation for details): ADL's;IADL's;Leisure;Rest and Sleep    Body Structure / Function / Physical Skills ADL;Coordination;Endurance;GMC;UE functional use;Balance;IADL;Pain;Dexterity;FMC;Strength;Edema;Mobility;ROM    Psychosocial Skills Environmental  Adaptations;Habits;Routines and Behaviors    Rehab Potential Good    Clinical Decision Making Several treatment options, min-mod task modification necessary   Comorbidities Affecting Occupational Performance: May have comorbidities impacting occupational performance  Modification or Assistance to Complete Evaluation  Min-Moderate modification of tasks or assist with  assess necessary to complete eval    OT Frequency 2x / week    OT Duration 12 weeks    OT Treatment/Interventions Self-care/ADL training;Cryotherapy;Paraffin;Therapeutic exercise;DME and/or AE instruction;Functional Mobility Training;Balance training;Electrical Stimulation;Ultrasound;Neuromuscular education;Manual Therapy;Splinting;Moist Heat;Contrast Bath;Passive range of motion;Therapeutic activities;Patient/family education;Coping strategies training    Plan OT recert    Consulted and Agree with Plan of Care Patient            Leta Speller, MS, OTR/L  Darleene Cleaver, OT 03/22/2022, 12:38 PM

## 2022-03-22 NOTE — Progress Notes (Signed)
Carelink Summary Report / Loop Recorder 

## 2022-03-26 ENCOUNTER — Ambulatory Visit: Payer: Medicare PPO | Attending: Internal Medicine

## 2022-03-26 DIAGNOSIS — M25512 Pain in left shoulder: Secondary | ICD-10-CM | POA: Insufficient documentation

## 2022-03-26 DIAGNOSIS — I63511 Cerebral infarction due to unspecified occlusion or stenosis of right middle cerebral artery: Secondary | ICD-10-CM | POA: Diagnosis present

## 2022-03-26 DIAGNOSIS — G8929 Other chronic pain: Secondary | ICD-10-CM | POA: Diagnosis present

## 2022-03-26 DIAGNOSIS — M6281 Muscle weakness (generalized): Secondary | ICD-10-CM | POA: Insufficient documentation

## 2022-03-26 DIAGNOSIS — R278 Other lack of coordination: Secondary | ICD-10-CM | POA: Insufficient documentation

## 2022-03-26 NOTE — Therapy (Signed)
OUTPATIENT OCCUPATIONAL THERAPY NEURO TREATMENT NOTE  Patient Name: Teresa [name] MRN: [id number] DOB:27-Jun-1940, 82 y.o., female Today's Date: 12/13/2021  PCP: Dr. [name] REFERRING PROVIDER: Dr. [name]   OT End of Session - 03/26/22 1424     Visit Number 84    Number of Visits 51    Date for OT Re-Evaluation 04/25/22    Authorization Time Period Reporting period beginning 03/12/22    OT Start Time 1145    OT Stop Time 1230    OT Time Calculation (min) 45 min    Equipment Utilized During Treatment cane    Activity Tolerance Patient tolerated treatment well    Behavior During Therapy WFL for tasks assessed/performed             Past Medical History:  Diagnosis Date   Cancer ([place])    skin   Hypothyroidism    Raynaud's disease    Stroke [facility])    Past Surgical History:  Procedure Laterality Date   [place]  2010   Cervical fusion C4-5-6-7   CATARACT EXTRACTION, BILATERAL Bilateral 10/2016   CHOLECYSTECTOMY  1995   COLONOSCOPY WITH PROPOFOL N/A 12/08/2018   Procedure: COLONOSCOPY WITH PROPOFOL;  Surgeon: Toledo, Benay Pike, MD;  Location: [facility] ENDOSCOPY;  Service: Gastroenterology;  Laterality: N/A;   EYE SURGERY     Patient Active Problem List   Diagnosis Date Noted   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  Following inpt rehab, pt went home and had home  health.   PRECAUTIONS: fall  SUBJECTIVE:  Pt reports doing well today.  Pt arrived on the wrong tx day but was able to take a time slot from another pt cancellation.   PAIN:  Are you having pain? 0 OBJECTIVE:  L grip 6, R grip 41 L lateral pinch 5, R 13 L 3 point pinch 4, R 14  L 9 hole 1 min 43 sec. L shoulder active flexion 0-105, passive 0-125 02/05/22: L grip 13#  L lateral pinch 8#  3 point pinch 5#  L shoulder flex 0-108 03/12/22:  L grip: 12# L lateral pinch: 8# 3 point pinch: 6# L 9 hole: 1 min 3 sec L shoulder flexion 0-110  TODAY'S TREATMENT: Therapeutic Exercise: Performed passive stretching at L shoulder for flexion, abd, ER, horiz add/abd, forearm pron/sup, and wrist ext, working to increase shoulder mobility for UB ADLs and overhead reaching.  Facilitated L grip strengthening with hand gripper set at min resistance with 1 red band to complete 2 sets 10 reps, then 3 sets 10 reps at end of session, rest between sets.  Intermittent min A to reposition hand on hand gripper to prevent dropping gripper from hand and to ensure L SF and IF were engaged.  Rest between sets.  Pt completed resistive exercises for LUE, including 1# weight for L wrist flex/ext/RD, forearm pron/sup, elbow flex/ext, shoulder flex and abd  for 2 sets 10 reps each.  Pt required intermittent tactile cues for form and technique.  Attempted 2# weight today but form was lacking so remained at 1# weight for the 2 sets.   Neuro re-ed: Facilitated L hand FMC/dexterity skills working to place grooved pegs into pegboard.  Pt struggled to maintain pinch and had frequent dropping of pegs from fingertips.  Pt required mod vc for prehension patterns and gripping at the top of the peg.  Pt was able to place and remove 4-5 pegs successfully with repeated attempts.   PATIENT EDUCATION: Education details: L hand coordination skills Person educated: pt Education method: Explanation and Verbal cues Education  comprehension: verbalized understanding   HOME EXERCISE PROGRAM Continue to engage LUE into ADLs; continue gripping and pinching exercises for LUE, and L shoulder AROM/AAROM, crocheting, typing; increase participation in IADL tasks for greater use of L hand.    OT Long Term Goals -       OT LONG TERM GOAL #1   Title Pt will be independent with home exercise program.    Baseline Eval: no current program, 10th visit:  continue to add new exercises as pt progresses, 20th:  continue to update HEP; 08/17/21: continue to progress HEP when indicated.  Visit 40: adding new exercises as pt progresses; 12/25/21: ongoing with progressions; 03/12/22: inconsistent use of putty but regular use of stress ball (every other day).  Encouraged pt return to daily putty use (2x daily when able) to target grip and pinch strengthening.   Time 12    Period Weeks    Status On-going    Target Date 04/24/22     OT LONG TERM GOAL #2   Title Pt will complete UB and LB dressing with modified independence including buttons, snaps and zippers.    Baseline requires min assist at eval, 10th visit: occasional assist with buttons, 20th:  able to perform one handed, but difficulty with bilateral UE; 08/17/21: pt reports inconsistent with 1 hand, reviewed techniques this visit, Visit 40:  Pt requires assist with bra   Time 12    Period Weeks    Status MET   Target Date 11/08/21     OT LONG TERM GOAL #3   Title Pt will perform shower transfer with modified independence.    Baseline Pt requires supervision to min assist for shower transfer at home. 10th visit: supervision; 08/17/21: supv .  Visit 40:  Pt had met goal but had recent fall and now requiring min guard to supv   Time 6    Period Weeks    Status  Met    Target Date 11/08/21      OT LONG TERM GOAL #4   Title Pt will improve L hand grip by 10# to assist with holding items in left hand securely.    Baseline no grip in left hand at eval, 10th visit:  improved flexion  but still working towards composite fisting and grip. 20th:  continues to demo decreased grip; 08/17/21: active digit flexion improving, but not yet able to register grip on dynamometer; 09/04/21: L grip 1# 7/17:  5#; 12/25/21: L grip 6#; 02/05/22: 13#; 03/12/22: L grip 12#   Time 12    Period Weeks    Status On-going    Target Date 04/25/22     OT LONG TERM GOAL #5   Title Pt will improve left shoulder flexion to 100 degrees or better to improve reaching to obtain self care items from shelf/shoulder height.  Baseline difficulty with reach, shoulder flexion to 47 degrees; 08/17/21: L shoulder flexion 85, but not yet able to consistently hold ADL supplies in L hand when reaching; 09/04/21: flexion 85 7/17:  shoulder flexion to 90; 12/25/21: 105 (P 125); 03/12/22: active L shoulder flexion 110 (pt uses R arm to reach for items at shoulder height or above)    Time 12    Period Weeks    Status On-going    Target Date 04/25/22     OT LONG TERM GOAL #6   Title Pt will improve FOTO score to 47 or above to demonstrate a clinically relevant change in function to impact ADL tasks.    Baseline score of 30 at eval; 08/17/21: FOTO: 42; 09/04/21: FOTO : 54 , FOTO 7/7:  60; 12/25/21: 59; 03/12/22: 66   Time 12    Period Weeks    Status  MET/ongoing    Target Date 04/25/22     OT LONG TERM GOAL #7   Title Pt will demonstrate ability to pick up small objects and complete 9 hole peg test in less than 1 min 30 sec (revised from under 2 min)   Baseline unable to perform at eval, 10th visit: still unable to pick up small pegs, 20th: unable to pick up pegs but improving; 08/17/21: not attempted d/t time constraints, will assess next visit; 09/04/21: 9 hole peg test in 5 min 53 sec;  7/17:  Pt unable to complete this date but was able to place 6 of 9 pegs in 4 mins 20 secs; 12/25/21: L 1 min 43 sec; 03/12/22: L 1 min 2 sec (requires non-skid surface to pick up small items from table top)   Time 12    Period Weeks    Status  Revised/On-going    Target Date 04/25/22             Plan -     Clinical Impression Statement Pt struggled with form using the 2# dumbbell today for LUE strengthening, so 1# weight was used for 2 trials for wrist, forearm, elbow, and shoulder planes.  In working with the grooved pegs, pt struggled to maintain pinch and had frequent dropping of pegs from fingertips.  Pt required mod vc for prehension patterns and gripping at the top of the peg.  Pt was able to place and remove 4-5 pegs successfully with repeated attempts.  Pt will continue to work towards goals in plan of care to improve left UE function, decrease pain and increase active normal movement patterns for daily tasks.    OT Occupational Profile and History Detailed Assessment- Review of Records and additional review of physical, cognitive, psychosocial history related to current functional performance    Occupational performance deficits (Please refer to evaluation for details): ADL's;IADL's;Leisure;Rest and Sleep    Body Structure / Function / Physical Skills ADL;Coordination;Endurance;GMC;UE functional use;Balance;IADL;Pain;Dexterity;FMC;Strength;Edema;Mobility;ROM    Psychosocial Skills Environmental  Adaptations;Habits;Routines and Behaviors    Rehab Potential Good    Clinical Decision Making Several treatment options, min-mod task modification necessary   Comorbidities Affecting Occupational Performance: May have comorbidities impacting occupational performance    Modification or Assistance to Complete Evaluation  Min-Moderate modification of tasks or assist with assess necessary to complete eval    OT Frequency 2x / week    OT Duration 12 weeks    OT Treatment/Interventions Self-care/ADL training;Cryotherapy;Paraffin;Therapeutic exercise;DME and/or AE instruction;Functional Mobility Training;Balance training;Electrical Stimulation;Ultrasound;Neuromuscular education;Manual Therapy;Splinting;Moist Heat;Contrast Bath;Passive range  of motion;Therapeutic activities;Patient/family education;Coping strategies training    Plan  OT recert    Consulted and Agree with Plan of Care Patient            Leta Speller, MS, OTR/L  Darleene Cleaver, OT 03/26/2022, 2:30 PM

## 2022-03-27 ENCOUNTER — Ambulatory Visit: Payer: Medicare PPO

## 2022-03-27 DIAGNOSIS — M6281 Muscle weakness (generalized): Secondary | ICD-10-CM

## 2022-03-27 DIAGNOSIS — G8929 Other chronic pain: Secondary | ICD-10-CM

## 2022-03-27 DIAGNOSIS — R278 Other lack of coordination: Secondary | ICD-10-CM

## 2022-03-27 NOTE — Therapy (Signed)
OUTPATIENT OCCUPATIONAL THERAPY NEURO TREATMENT NOTE  Patient Name: Teresa [name] MRN: [id number] DOB:1940-07-16, 82 y.o., female Today's Date: 12/13/2021  PCP: Dr. [name] REFERRING PROVIDER: Dr. [name]   OT End of Session - 03/27/22 1116     Visit Number 32    Number of Visits 94    Date for OT Re-Evaluation 04/25/22    Authorization Time Period Reporting period beginning 03/12/22    OT Start Time 1100    OT Stop Time 1145    OT Time Calculation (min) 45 min    Equipment Utilized During Treatment cane    Activity Tolerance Patient tolerated treatment well    Behavior During Therapy WFL for tasks assessed/performed             Past Medical History:  Diagnosis Date   Cancer ([place])    skin   Hypothyroidism    Raynaud's disease    Stroke [facility])    Past Surgical History:  Procedure Laterality Date   [place]  2010   Cervical fusion C4-5-6-7   CATARACT EXTRACTION, BILATERAL Bilateral 10/2016   CHOLECYSTECTOMY  1995   COLONOSCOPY WITH PROPOFOL N/A 12/08/2018   Procedure: COLONOSCOPY WITH PROPOFOL;  Surgeon: Toledo, Benay Pike, MD;  Location: [facility] ENDOSCOPY;  Service: Gastroenterology;  Laterality: N/A;   EYE SURGERY     Patient Active Problem List   Diagnosis Date Noted   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  Following inpt rehab, pt went home and had home health.    PRECAUTIONS: fall  SUBJECTIVE:  Pt reports INDly using a fork in her L hand to assist with cutting food at a a restaurant.   PAIN:  Are you having pain? 0 OBJECTIVE:  L grip 6, R grip 41 L lateral pinch 5, R 13 L 3 point pinch 4, R 14  L 9 hole 1 min 43 sec. L shoulder active flexion 0-105, passive 0-125 02/05/22: L grip 13#  L lateral pinch 8#  3 point pinch 5#  L shoulder flex 0-108 03/12/22:  L grip: 12# L lateral pinch: 8# 3 point pinch: 6# L 9 hole: 1 min 3 sec L shoulder flexion 0-110  TODAY'S TREATMENT: Therapeutic Exercise: Performed passive stretching at L shoulder for flexion, abd, ER, horiz add/abd, forearm pron/sup, and wrist ext, working to increase shoulder mobility for UB ADLs and overhead reaching.  Facilitated strengthening with 2# weight for L wrist flex/ext/RD, forearm pron/sup, elbow flex/ext for 2 sets 10 reps each.  Completes 3 reps with 2# weight for shoulder flex/abd then decreased to 1# for 7 reps. Pt required demonstration and cues for form and technique.  Reports difficulty maintaining grasp r/t raynaud's, heat used intermittently t/o.  Self Care: MIN A don/doff jacket in standing, assist to pull up over shoulders and for tight arm sleeves. Pt educated on rechargeable hand warmers for home use.     PATIENT EDUCATION: Education  details: L hand coordination skills Person educated: pt Education method: Explanation and Verbal cues Education comprehension: verbalized understanding   HOME EXERCISE PROGRAM Continue to engage LUE into ADLs; continue gripping and pinching exercises for LUE, and L shoulder AROM/AAROM, crocheting, typing; increase participation in IADL tasks for greater use of L hand.    OT Long Term Goals -       OT LONG TERM GOAL #1   Title Pt will be independent with home exercise program.    Baseline Eval: no current program, 10th visit:  continue to add new exercises as pt progresses, 20th:  continue to update HEP; 08/17/21:  continue to progress HEP when indicated.  Visit 40: adding new exercises as pt progresses; 12/25/21: ongoing with progressions; 03/12/22: inconsistent use of putty but regular use of stress ball (every other day).  Encouraged pt return to daily putty use (2x daily when able) to target grip and pinch strengthening.   Time 12    Period Weeks    Status On-going    Target Date 04/24/22     OT LONG TERM GOAL #2   Title Pt will complete UB and LB dressing with modified independence including buttons, snaps and zippers.    Baseline requires min assist at eval, 10th visit: occasional assist with buttons, 20th:  able to perform one handed, but difficulty with bilateral UE; 08/17/21: pt reports inconsistent with 1 hand, reviewed techniques this visit, Visit 40:  Pt requires assist with bra   Time 12    Period Weeks    Status MET   Target Date 11/08/21     OT LONG TERM GOAL #3   Title Pt will perform shower transfer with modified independence.    Baseline Pt requires supervision to min assist for shower transfer at home. 10th visit: supervision; 08/17/21: supv .  Visit 40:  Pt had met goal but had recent fall and now requiring min guard to supv   Time 6    Period Weeks    Status  Met    Target Date 11/08/21      OT LONG TERM GOAL #4   Title Pt will improve L hand grip by 10# to assist with holding items in left hand securely.    Baseline no grip in left hand at eval, 10th visit:  improved flexion but still working towards composite fisting and grip. 20th:  continues to demo decreased grip; 08/17/21: active digit flexion improving, but not yet able to register grip on dynamometer; 09/04/21: L grip 1# 7/17:  5#; 12/25/21: L grip 6#; 02/05/22: 13#; 03/12/22: L grip 12#   Time 12    Period Weeks    Status On-going    Target Date 04/25/22     OT LONG TERM GOAL #5   Title Pt will improve left shoulder flexion to 100 degrees or better to improve reaching to obtain self care items from shelf/shoulder height.     Baseline difficulty with reach, shoulder flexion to 47 degrees; 08/17/21: L shoulder flexion 85, but not yet able to consistently hold ADL supplies in L hand when reaching; 09/04/21: flexion 85 7/17:  shoulder flexion to 90; 12/25/21: 105 (P 125); 03/12/22: active L shoulder flexion 110 (pt uses R arm to reach for items at shoulder height or above)    Time 12    Period Weeks    Status On-going    Target Date 04/25/22     OT LONG TERM GOAL #6   Title Pt will improve  FOTO score to 47 or above to demonstrate a clinically relevant change in function to impact ADL tasks.    Baseline score of 30 at eval; 08/17/21: FOTO: 42; 09/04/21: FOTO : 54 , FOTO 7/7:  60; 12/25/21: 59; 03/12/22: 66   Time 12    Period Weeks    Status  MET/ongoing    Target Date 04/25/22     OT LONG TERM GOAL #7   Title Pt will demonstrate ability to pick up small objects and complete 9 hole peg test in less than 1 min 30 sec (revised from under 2 min)   Baseline unable to perform at eval, 10th visit: still unable to pick up small pegs, 20th: unable to pick up pegs but improving; 08/17/21: not attempted d/t time constraints, will assess next visit; 09/04/21: 9 hole peg test in 5 min 53 sec;  7/17:  Pt unable to complete this date but was able to place 6 of 9 pegs in 4 mins 20 secs; 12/25/21: L 1 min 43 sec; 03/12/22: L 1 min 2 sec (requires non-skid surface to pick up small items from table top)   Time 12    Period Weeks    Status Revised/On-going    Target Date 04/25/22             Plan -     Clinical Impression Statement Improved to 2# for elbow/wrist exercises with demonstration and cues for slowing speed, continues to require 1# for shoulder exercises. MIN A don/doff jacket. Pt will continue to work towards goals in plan of care to improve left UE function, decrease pain and increase active normal movement patterns for daily tasks.    OT Occupational Profile and History Detailed Assessment- Review of Records and additional review  of physical, cognitive, psychosocial history related to current functional performance    Occupational performance deficits (Please refer to evaluation for details): ADL's;IADL's;Leisure;Rest and Sleep    Body Structure / Function / Physical Skills ADL;Coordination;Endurance;GMC;UE functional use;Balance;IADL;Pain;Dexterity;FMC;Strength;Edema;Mobility;ROM    Psychosocial Skills Environmental  Adaptations;Habits;Routines and Behaviors    Rehab Potential Good    Clinical Decision Making Several treatment options, min-mod task modification necessary   Comorbidities Affecting Occupational Performance: May have comorbidities impacting occupational performance    Modification or Assistance to Complete Evaluation  Min-Moderate modification of tasks or assist with assess necessary to complete eval    OT Frequency 2x / week    OT Duration 12 weeks    OT Treatment/Interventions Self-care/ADL training;Cryotherapy;Paraffin;Therapeutic exercise;DME and/or AE instruction;Functional Mobility Training;Balance training;Electrical Stimulation;Ultrasound;Neuromuscular education;Manual Therapy;Splinting;Moist Heat;Contrast Bath;Passive range of motion;Therapeutic activities;Patient/family education;Coping strategies training    Plan OT recert    Consulted and Agree with Plan of Care Patient            Dessie Coma, M.S. OTR/L  03/27/22, 12:27 PM  ascom [phone]   Leonides Cave, OT 03/27/2022, 12:27 PM

## 2022-04-01 ENCOUNTER — Ambulatory Visit: Payer: Medicare PPO

## 2022-04-01 DIAGNOSIS — M6281 Muscle weakness (generalized): Secondary | ICD-10-CM

## 2022-04-01 DIAGNOSIS — I63511 Cerebral infarction due to unspecified occlusion or stenosis of right middle cerebral artery: Secondary | ICD-10-CM

## 2022-04-01 DIAGNOSIS — R278 Other lack of coordination: Secondary | ICD-10-CM

## 2022-04-02 NOTE — Therapy (Signed)
OUTPATIENT OCCUPATIONAL THERAPY NEURO TREATMENT NOTE  Patient Name: Teresa [name] MRN: [id number] DOB:1940/05/29, 82 y.o., female Today's Date: 12/13/2021  PCP: Dr. [name] REFERRING PROVIDER: Dr. [name]   OT End of Session - 04/02/22 1126     Visit Number 86    Number of Visits 94    Date for OT Re-Evaluation 04/25/22    Authorization Time Period Reporting period beginning 03/12/22    OT Start Time 1149    OT Stop Time 1230    OT Time Calculation (min) 41 min    Equipment Utilized During Treatment cane    Activity Tolerance Patient tolerated treatment well    Behavior During Therapy WFL for tasks assessed/performed             Past Medical History:  Diagnosis Date   Cancer ([place])    skin   Hypothyroidism    Raynaud's disease    Stroke [facility])    Past Surgical History:  Procedure Laterality Date   [place]  2010   Cervical fusion C4-5-6-7   CATARACT EXTRACTION, BILATERAL Bilateral 10/2016   CHOLECYSTECTOMY  1995   COLONOSCOPY WITH PROPOFOL N/A 12/08/2018   Procedure: COLONOSCOPY WITH PROPOFOL;  Surgeon: Toledo, Benay Pike, MD;  Location: [facility] ENDOSCOPY;  Service: Gastroenterology;  Laterality: N/A;   EYE SURGERY     Patient Active Problem List   Diagnosis Date Noted   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  Following inpt rehab, pt went home and had home  health.   PRECAUTIONS: fall  SUBJECTIVE:  Pt reports having a bad reaction to her shingles vaccine last week and will see her PCP this afternoon.  L arm was swollen, pt felt achy, and pt has been sleeping more since her shot.    PAIN:  Are you having pain? 0 OBJECTIVE:  L grip 6, R grip 41 L lateral pinch 5, R 13 L 3 point pinch 4, R 14  L 9 hole 1 min 43 sec. L shoulder active flexion 0-105, passive 0-125 02/05/22: L grip 13#  L lateral pinch 8#  3 point pinch 5#  L shoulder flex 0-108 03/12/22:  L grip: 12# L lateral pinch: 8# 3 point pinch: 6# L 9 hole: 1 min 3 sec L shoulder flexion 0-110  TODAY'S TREATMENT: Manual Therapy: Performed edema massage to L dorsal wrist, dorsal hand, and digits 1-5, working to decrease edema and enable full composite fisting.  Therapeutic Exercise: Performed L grip strengthening with use of hand gripper set at moderate resistance to perform 5 sets 10 reps, spaced out throughout session with rest breaks between sets.  OT provided passive stretching to digits 2-5 in L hand for increased MP, PIP, and DIP flexion for composite fist d/t increased swelling in hand today since shingles shot last week.  Neuro re-ed: Facilitated L hand FMC/dexterity skills working to Ware[employer] stones.  Pt practiced  picking up stones 1 at a time and moving them from 1 dish to another on game board.  Pt practiced picking up stones with a 2 and 3 point pinch pattern, and required intermittent vc to maintain eyes focussed on fingers to reduce dropped stones.     PATIENT EDUCATION: Education details: L hand coordination skills Person educated: pt Education method: Explanation and Verbal cues Education comprehension: verbalized understanding   HOME EXERCISE PROGRAM Continue to engage LUE into ADLs; continue gripping and pinching exercises for LUE, and L shoulder AROM/AAROM, crocheting, typing; increase participation in IADL tasks for greater use of L  hand.    OT Long Term Goals -       OT LONG TERM GOAL #1   Title Pt will be independent with home exercise program.    Baseline Eval: no current program, 10th visit:  continue to add new exercises as pt progresses, 20th:  continue to update HEP; 08/17/21: continue to progress HEP when indicated.  Visit 40: adding new exercises as pt progresses; 12/25/21: ongoing with progressions; 03/12/22: inconsistent use of putty but regular use of stress ball (every other day).  Encouraged pt return to daily putty use (2x daily when able) to target grip and pinch strengthening.   Time 12    Period Weeks    Status On-going    Target Date 04/24/22     OT LONG TERM GOAL #2   Title Pt will complete UB and LB dressing with modified independence including buttons, snaps and zippers.    Baseline requires min assist at eval, 10th visit: occasional assist with buttons, 20th:  able to perform one handed, but difficulty with bilateral UE; 08/17/21: pt reports inconsistent with 1 hand, reviewed techniques this visit, Visit 40:  Pt requires assist with bra   Time 12    Period Weeks    Status MET   Target Date 11/08/21     OT LONG TERM GOAL #3   Title Pt will perform shower transfer with modified independence.    Baseline Pt requires supervision to min assist for shower transfer at home. 10th visit: supervision; 08/17/21: supv .  Visit 40:  Pt had met goal but had recent fall and now requiring min guard to supv   Time 6    Period Weeks    Status  Met    Target Date 11/08/21      OT LONG TERM GOAL #4   Title Pt will improve L hand grip by 10# to assist with holding items in left hand securely.    Baseline no grip in left hand at eval, 10th visit:  improved flexion but still working towards composite fisting and grip. 20th:  continues to demo decreased grip; 08/17/21: active digit flexion improving, but not yet able to register grip on dynamometer; 09/04/21: L grip 1# 7/17:  5#; 12/25/21: L grip 6#; 02/05/22: 13#;  03/12/22: L grip 12#   Time 12    Period Weeks    Status On-going    Target Date 04/25/22     OT LONG TERM GOAL #5   Title Pt will improve left shoulder flexion to 100 degrees or better to improve reaching to obtain self care items from shelf/shoulder height.    Baseline difficulty with reach, shoulder flexion to 47 degrees; 08/17/21: L shoulder flexion 85, but not yet able to consistently hold ADL supplies in L hand when reaching; 09/04/21: flexion 85 7/17:  shoulder flexion to 90; 12/25/21: 105 (P 125); 03/12/22: active  L shoulder flexion 110 (pt uses R arm to reach for items at shoulder height or above)    Time 12    Period Weeks    Status On-going    Target Date 04/25/22     OT LONG TERM GOAL #6   Title Pt will improve FOTO score to 47 or above to demonstrate a clinically relevant change in function to impact ADL tasks.    Baseline score of 30 at eval; 08/17/21: FOTO: 42; 09/04/21: FOTO : 54 , FOTO 7/7:  60; 12/25/21: 59; 03/12/22: 66   Time 12    Period Weeks    Status  MET/ongoing    Target Date 04/25/22     OT LONG TERM GOAL #7   Title Pt will demonstrate ability to pick up small objects and complete 9 hole peg test in less than 1 min 30 sec (revised from under 2 min)   Baseline unable to perform at eval, 10th visit: still unable to pick up small pegs, 20th: unable to pick up pegs but improving; 08/17/21: not attempted d/t time constraints, will assess next visit; 09/04/21: 9 hole peg test in 5 min 53 sec;  7/17:  Pt unable to complete this date but was able to place 6 of 9 pegs in 4 mins 20 secs; 12/25/21: L 1 min 43 sec; 03/12/22: L 1 min 2 sec (requires non-skid surface to pick up small items from table top)   Time 12    Period Weeks    Status Revised/On-going    Target Date 04/25/22             Plan -     Clinical Impression Statement Limited tx today to distal strengthening and coordination exercises as pt had increased soreness and fatigue this date in general and overall heaviness  and increased edema throughout her LUE (specifically more difficulty lifting the arm) following her shingles injection last week.  Pt will see her PCP this afternoon for follow up regarding her abnormal reaction to the vaccine.  Pt reported that she was unable to don her compression glove today d/t the increased swelling in her hand.  Pt tolerated edema massage well this date and OT encouraged elevation and intermittent use and AROM of the L hand to move fluid out of her hand.  By end of session, pt was noted to have improved composite fist, requiring min A for full composite fist.  Pt had increased difficulty with small object manipulation this date d/t the swelling in her hand and increased feeling of sluggishness throughout the LUE.  Pt was able to pick up 1 stone at a time, though with frequent dropping, and pt didn't feel like she could manage her usual ability to store multiple stones in palm of hand.  Pt will continue to work towards goals in plan of care to improve left UE function, decrease pain and increase active normal movement patterns for daily tasks.    OT Occupational Profile and History Detailed Assessment- Review of Records and additional review of physical, cognitive, psychosocial history related to current functional performance    Occupational performance deficits (Please refer to evaluation for details): ADL's;IADL's;Leisure;Rest and Sleep    Body Structure / Function / Physical Skills ADL;Coordination;Endurance;GMC;UE functional use;Balance;IADL;Pain;Dexterity;FMC;Strength;Edema;Mobility;ROM    Psychosocial Skills Environmental  Adaptations;Habits;Routines and Behaviors    Rehab Potential Good    Clinical Decision Making Several treatment options, min-mod task modification necessary   Comorbidities Affecting Occupational Performance: May have comorbidities impacting occupational performance  Modification or Assistance to Complete Evaluation  Min-Moderate modification of tasks or  assist with assess necessary to complete eval    OT Frequency 2x / week    OT Duration 12 weeks    OT Treatment/Interventions Self-care/ADL training;Cryotherapy;Paraffin;Therapeutic exercise;DME and/or AE instruction;Functional Mobility Training;Balance training;Electrical Stimulation;Ultrasound;Neuromuscular education;Manual Therapy;Splinting;Moist Heat;Contrast Bath;Passive range of motion;Therapeutic activities;Patient/family education;Coping strategies training    Plan OT recert    Consulted and Agree with Plan of Care Patient            Leta Speller, MS, OTR/L  Darleene Cleaver, OT 04/02/2022, 11:47 AM

## 2022-04-03 ENCOUNTER — Ambulatory Visit: Payer: Medicare PPO

## 2022-04-03 DIAGNOSIS — R278 Other lack of coordination: Secondary | ICD-10-CM

## 2022-04-03 DIAGNOSIS — I63511 Cerebral infarction due to unspecified occlusion or stenosis of right middle cerebral artery: Secondary | ICD-10-CM

## 2022-04-03 DIAGNOSIS — M6281 Muscle weakness (generalized): Secondary | ICD-10-CM | POA: Diagnosis not present

## 2022-04-03 NOTE — Therapy (Unsigned)
OUTPATIENT OCCUPATIONAL THERAPY NEURO TREATMENT NOTE  Patient Name: Teresa [name] MRN: [id number] DOB:04-May-1940, 82 y.o., female Today's Date: 12/13/2021  PCP: Dr. [name] REFERRING PROVIDER: Dr. [name]   OT End of Session - 04/03/22 1041     Visit Number 87    Number of Visits 94    Date for OT Re-Evaluation 04/25/22    Authorization Time Period Reporting period beginning 03/12/22    OT Start Time 1019    OT Stop Time 1100    OT Time Calculation (min) 41 min    Equipment Utilized During Treatment cane    Activity Tolerance Patient tolerated treatment well    Behavior During Therapy WFL for tasks assessed/performed             Past Medical History:  Diagnosis Date   Cancer ([place])    skin   Hypothyroidism    Raynaud's disease    Stroke [facility])    Past Surgical History:  Procedure Laterality Date   [place]  2010   Cervical fusion C4-5-6-7   CATARACT EXTRACTION, BILATERAL Bilateral 10/2016   CHOLECYSTECTOMY  1995   COLONOSCOPY WITH PROPOFOL N/A 12/08/2018   Procedure: COLONOSCOPY WITH PROPOFOL;  Surgeon: Toledo, Benay Pike, MD;  Location: [facility] ENDOSCOPY;  Service: Gastroenterology;  Laterality: N/A;   EYE SURGERY     Patient Active Problem List   Diagnosis Date Noted   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  Following inpt rehab, pt went home and had home  health.   PRECAUTIONS: fall  SUBJECTIVE:  Pt reports that she had a follow up with Dr. Doy Hutching last week regarding her reaction to her shingles shot.  Pt started prednisone and an antibiotic as MD was fearful of development of cellulitis where pt's arm had swelled.  Pt reports she's feeling better, though still experiencing some fatigue and loss of appetite.  PAIN:  Are you having pain? 0 OBJECTIVE:  L grip 6, R grip 41 L lateral pinch 5, R 13 L 3 point pinch 4, R 14  L 9 hole 1 min 43 sec. L shoulder active flexion 0-105, passive 0-125 02/05/22: L grip 13#  L lateral pinch 8#  3 point pinch 5#  L shoulder flex 0-108 03/12/22:  L grip: 12# L lateral pinch: 8# 3 point pinch: 6# L 9 hole: 1 min 3 sec L shoulder flexion 0-110  TODAY'S TREATMENT: Therapeutic Exercise: Performed L grip strengthening with use of hand gripper set at moderate resistance to perform 3 sets 10 reps of grip squeezes.  Performed standing pulley exercises to facilitate increased L shoulder flexion and abd.  Pt required min verbal and visual cues for form with shoulder flexion and min tactile cues for shoulder abduction to elicit desired movement with pulleys.  Neuro re-ed: Facilitated L hand FMC/dexterity skills working to pick up 1/4"-1" washers from magnetic dish with L hand.  Pt threaded  washers onto horizontal dowel which she held in her R hand.  Pt practiced unthreading washers, storing in hand, and discarding washers 1 at a time moving washers from palm to fingertips.  PATIENT EDUCATION: Education details: L hand coordination skills Person educated: pt Education method: Explanation and Verbal cues Education comprehension: verbalized understanding   HOME EXERCISE PROGRAM Continue to engage LUE into ADLs; continue gripping and pinching exercises for LUE, and L shoulder AROM/AAROM, crocheting, typing; increase participation in IADL tasks for greater use of L hand.    OT Long Term Goals -       OT  LONG TERM GOAL #1   Title Pt will be independent with home exercise program.    Baseline Eval: no current program, 10th visit:  continue to add new exercises as pt progresses, 20th:  continue to update HEP; 08/17/21: continue to progress HEP when indicated.  Visit 40: adding new exercises as pt progresses; 12/25/21: ongoing with progressions; 03/12/22: inconsistent use of putty but regular use of stress ball (every other day).  Encouraged pt return to daily putty use (2x daily when able) to target grip and pinch strengthening.   Time 12    Period Weeks    Status On-going    Target Date 04/24/22     OT LONG TERM GOAL #2   Title Pt will complete UB and LB dressing with modified independence including buttons, snaps and zippers.    Baseline requires min assist at eval, 10th visit: occasional assist with buttons, 20th:  able to perform one handed, but difficulty with bilateral UE; 08/17/21: pt reports inconsistent with 1 hand, reviewed techniques this visit, Visit 40:  Pt requires assist with bra   Time 12    Period Weeks    Status MET   Target Date 11/08/21     OT LONG TERM GOAL #3   Title Pt will perform shower transfer with modified independence.    Baseline Pt requires supervision to min assist for shower transfer at home. 10th visit: supervision; 08/17/21: supv .  Visit 40:  Pt had met goal but had recent fall and now requiring min guard to supv   Time 6    Period Weeks    Status  Met    Target Date 11/08/21      OT LONG TERM GOAL #4   Title Pt will improve L hand grip by 10# to assist with holding items in left hand securely.    Baseline no grip in left hand at eval, 10th visit:  improved flexion but still working towards composite fisting and grip. 20th:  continues to demo decreased grip; 08/17/21: active digit flexion improving, but not yet able to register grip on dynamometer; 09/04/21: L grip 1# 7/17:  5#; 12/25/21: L grip 6#; 02/05/22: 13#; 03/12/22: L grip 12#   Time 12    Period Weeks     Status On-going    Target Date 04/25/22     OT LONG TERM GOAL #5   Title Pt will improve left shoulder flexion to 100 degrees or better to improve reaching to obtain self care items from shelf/shoulder height.    Baseline difficulty with reach, shoulder flexion to 47 degrees; 08/17/21: L shoulder flexion 85, but not yet able to consistently hold ADL supplies in L hand when reaching; 09/04/21: flexion 85 7/17:  shoulder flexion to 90; 12/25/21: 105 (P 125); 03/12/22: active L shoulder flexion 110 (pt uses R arm to reach for items at shoulder height or above)  Time 12    Period Weeks    Status On-going    Target Date 04/25/22     OT LONG TERM GOAL #6   Title Pt will improve FOTO score to 47 or above to demonstrate a clinically relevant change in function to impact ADL tasks.    Baseline score of 30 at eval; 08/17/21: FOTO: 42; 09/04/21: FOTO : 54 , FOTO 7/7:  60; 12/25/21: 59; 03/12/22: 66   Time 12    Period Weeks    Status  MET/ongoing    Target Date 04/25/22     OT LONG TERM GOAL #7   Title Pt will demonstrate ability to pick up small objects and complete 9 hole peg test in less than 1 min 30 sec (revised from under 2 min)   Baseline unable to perform at eval, 10th visit: still unable to pick up small pegs, 20th: unable to pick up pegs but improving; 08/17/21: not attempted d/t time constraints, will assess next visit; 09/04/21: 9 hole peg test in 5 min 53 sec;  7/17:  Pt unable to complete this date but was able to place 6 of 9 pegs in 4 mins 20 secs; 12/25/21: L 1 min 43 sec; 03/12/22: L 1 min 2 sec (requires non-skid surface to pick up small items from table top)   Time 12    Period Weeks    Status Revised/On-going    Target Date 04/25/22             Plan -     Clinical Impression Statement Pt reports that she had a follow up with Dr. Doy Hutching last week regarding her reaction to her shingles shot.  Pt started prednisone and an antibiotic as MD was fearful of development of cellulitis where  pt's arm had swelled.  Pt reports she's feeling better, though still experiencing some fatigue and loss of appetite.  L hand swelling has decreased and pt was able to don her compression glove today.  Pt demonstrated improved ability to raise L arm above shoulder level today without pain or the heaviness that she was feeling last week.  Pt also demonstrated improved small item manipulation skills in the L hand, and was able to store multiple washers in hand today where as she was unable to last week d/t inability to fully close fingers to palm.  Pt will continue to work towards goals in plan of care to improve left UE function, decrease pain and increase active normal movement patterns for daily tasks.    OT Occupational Profile and History Detailed Assessment- Review of Records and additional review of physical, cognitive, psychosocial history related to current functional performance    Occupational performance deficits (Please refer to evaluation for details): ADL's;IADL's;Leisure;Rest and Sleep    Body Structure / Function / Physical Skills ADL;Coordination;Endurance;GMC;UE functional use;Balance;IADL;Pain;Dexterity;FMC;Strength;Edema;Mobility;ROM    Psychosocial Skills Environmental  Adaptations;Habits;Routines and Behaviors    Rehab Potential Good    Clinical Decision Making Several treatment options, min-mod task modification necessary   Comorbidities Affecting Occupational Performance: May have comorbidities impacting occupational performance    Modification or Assistance to Complete Evaluation  Min-Moderate modification of tasks or assist with assess necessary to complete eval    OT Frequency 2x / week    OT Duration 12 weeks    OT Treatment/Interventions Self-care/ADL training;Cryotherapy;Paraffin;Therapeutic exercise;DME and/or AE instruction;Functional Mobility Training;Balance training;Electrical Stimulation;Ultrasound;Neuromuscular education;Manual Therapy;Splinting;Moist Heat;Contrast  Bath;Passive range of motion;Therapeutic activities;Patient/family education;Coping strategies training    Plan OT recert    Consulted  and Agree with Plan of Care Patient            Leta Speller, MS, OTR/L  Darleene Cleaver, OT 04/04/2022, 11:58 AM

## 2022-04-09 ENCOUNTER — Ambulatory Visit: Payer: Medicare PPO

## 2022-04-09 DIAGNOSIS — I63511 Cerebral infarction due to unspecified occlusion or stenosis of right middle cerebral artery: Secondary | ICD-10-CM

## 2022-04-09 DIAGNOSIS — R278 Other lack of coordination: Secondary | ICD-10-CM

## 2022-04-09 DIAGNOSIS — M6281 Muscle weakness (generalized): Secondary | ICD-10-CM | POA: Diagnosis not present

## 2022-04-10 NOTE — Therapy (Signed)
OUTPATIENT OCCUPATIONAL THERAPY NEURO TREATMENT NOTE  Patient Name: Teresa [name] MRN: [id number] DOB:04-09-1940, 82 y.o., female Today's Date: 12/13/2021  PCP: Dr. [name] REFERRING PROVIDER: Dr. [name]   OT End of Session - 04/10/22 1408     Visit Number 29    Number of Visits 85    Date for OT Re-Evaluation 04/25/22    Authorization Time Period Reporting period beginning 03/12/22    OT Start Time 1430    OT Stop Time 1515    OT Time Calculation (min) 45 min    Equipment Utilized During Treatment cane    Activity Tolerance Patient tolerated treatment well    Behavior During Therapy WFL for tasks assessed/performed             Past Medical History:  Diagnosis Date   Cancer ([place])    skin   Hypothyroidism    Raynaud's disease    Stroke [facility])    Past Surgical History:  Procedure Laterality Date   [place]  2010   Cervical fusion C4-5-6-7   CATARACT EXTRACTION, BILATERAL Bilateral 10/2016   CHOLECYSTECTOMY  1995   COLONOSCOPY WITH PROPOFOL N/A 12/08/2018   Procedure: COLONOSCOPY WITH PROPOFOL;  Surgeon: Toledo, Benay Pike, MD;  Location: [facility] ENDOSCOPY;  Service: Gastroenterology;  Laterality: N/A;   EYE SURGERY     Patient Active Problem List   Diagnosis Date Noted   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   Stroke ([place]) 02/27/2021   History of nonmelanoma skin cancer 05/23/2014   ONSET DATE: 02/26/2021  REFERRING DIAG: R MCA CVA  THERAPY DIAG:  Muscle weakness (generalized)  Other lack of coordination  Right middle cerebral artery stroke [facility])  Rationale for Evaluation and Treatment Rehabilitation  PERTINENT HISTORY: February 26, 2021, pt reports she had a CVA, came to [facility] to ER and then was transferred to [facility] in [place] where she was admitted and after acute care she went to inpatient rehab.  Following inpt rehab, pt went home and had home  health.   PRECAUTIONS: fall  SUBJECTIVE:  Pt reports having to reschedule appointment from this morning d/t having to make phone calls as three of her credit cards were hacked.  PAIN:  Are you having pain? 0 OBJECTIVE:  L grip 6, R grip 41 L lateral pinch 5, R 13 L 3 point pinch 4, R 14  L 9 hole 1 min 43 sec. L shoulder active flexion 0-105, passive 0-125 02/05/22: L grip 13#  L lateral pinch 8#  3 point pinch 5#  L shoulder flex 0-108 03/12/22:  L grip: 12# L lateral pinch: 8# 3 point pinch: 6# L 9 hole: 1 min 3 sec L shoulder flexion 0-110  TODAY'S TREATMENT: Therapeutic Exercise: Performed passive stretching for L forearm pron/sup, L shoulder flexion, abd, and ER, working to increase joint ROM for engagement of LUE into daily tasks.   Performed L grip strengthening with use of hand gripper set at moderate resistance to perform 3 sets 10 reps of grip squeezes.  Completed another 2 sets 10 reps later in session, min tactile cues for increasing grip closure.  Facilitated pinch strengthening with use of therapy resistant clothespins to target lateral and 3 point pinch of L hand.  Pt able to manage yellow, red, green, and blue clips with a lateral pinch, but not yet able to manage blue clips with a 3 point pinch pattern.  Pt required min tactile cues and set up to formulate and maintain a lateral pinch.  Neuro re-ed: Facilitated L hand FMC/dexterity skills working to pick up 1/4"-1" washers from magnetic dish with L hand.  Pt threaded washers onto vertical dowel. Pt then lifted washers from dowel to place back into dish using L hand.   PATIENT EDUCATION: Education details: L hand coordination skills Person educated: pt Education method: Explanation and Verbal cues Education comprehension: verbalized understanding   HOME EXERCISE PROGRAM Continue to engage LUE into ADLs; continue gripping and pinching exercises for LUE, and L shoulder AROM/AAROM, crocheting, typing; increase  participation in IADL tasks for greater use of L hand.    OT Long Term Goals -       OT LONG TERM GOAL #1   Title Pt will be independent with home exercise program.    Baseline Eval: no current program, 10th visit:  continue to add new exercises as pt progresses, 20th:  continue to update HEP; 08/17/21: continue to progress HEP when indicated.  Visit 40: adding new exercises as pt progresses; 12/25/21: ongoing with progressions; 03/12/22: inconsistent use of putty but regular use of stress ball (every other day).  Encouraged pt return to daily putty use (2x daily when able) to target grip and pinch strengthening.   Time 12    Period Weeks    Status On-going    Target Date 04/24/22     OT LONG TERM GOAL #2   Title Pt will complete UB and LB dressing with modified independence including buttons, snaps and zippers.    Baseline requires min assist at eval, 10th visit: occasional assist with buttons, 20th:  able to perform one handed, but difficulty with bilateral UE; 08/17/21: pt reports inconsistent with 1 hand, reviewed techniques this visit, Visit 40:  Pt requires assist with bra   Time 12    Period Weeks    Status MET   Target Date 11/08/21     OT LONG TERM GOAL #3   Title Pt will perform shower transfer with modified independence.    Baseline Pt requires supervision to min assist for shower transfer at home. 10th visit: supervision; 08/17/21: supv .  Visit 40:  Pt had met goal but had recent fall and now requiring min guard to supv   Time 6    Period Weeks    Status  Met    Target Date 11/08/21      OT LONG TERM GOAL #4   Title Pt will improve L hand grip by 10# to assist with holding items in left hand securely.    Baseline no grip in left hand at eval, 10th visit:  improved flexion but still working towards composite fisting and grip. 20th:  continues to demo decreased grip; 08/17/21: active digit flexion improving, but not yet able to register grip on dynamometer; 09/04/21: L grip 1#  7/17:  5#; 12/25/21: L grip 6#; 02/05/22: 13#; 03/12/22: L grip 12#   Time 12    Period Weeks    Status On-going    Target Date 04/25/22     OT LONG TERM GOAL #5   Title Pt will improve left shoulder flexion to 100 degrees or better to improve reaching to obtain self care items from shelf/shoulder height.    Baseline difficulty with reach, shoulder flexion to 47 degrees; 08/17/21: L shoulder flexion 85, but not yet able to consistently hold ADL supplies in L hand when reaching; 09/04/21: flexion 85 7/17:  shoulder flexion  to 90; 12/25/21: 105 (P 125); 03/12/22: active L shoulder flexion 110 (pt uses R arm to reach for items at shoulder height or above)    Time 12    Period Weeks    Status On-going    Target Date 04/25/22     OT LONG TERM GOAL #6   Title Pt will improve FOTO score to 47 or above to demonstrate a clinically relevant change in function to impact ADL tasks.    Baseline score of 30 at eval; 08/17/21: FOTO: 42; 09/04/21: FOTO : 54 , FOTO 7/7:  60; 12/25/21: 59; 03/12/22: 66   Time 12    Period Weeks    Status  MET/ongoing    Target Date 04/25/22     OT LONG TERM GOAL #7   Title Pt will demonstrate ability to pick up small objects and complete 9 hole peg test in less than 1 min 30 sec (revised from under 2 min)   Baseline unable to perform at eval, 10th visit: still unable to pick up small pegs, 20th: unable to pick up pegs but improving; 08/17/21: not attempted d/t time constraints, will assess next visit; 09/04/21: 9 hole peg test in 5 min 53 sec;  7/17:  Pt unable to complete this date but was able to place 6 of 9 pegs in 4 mins 20 secs; 12/25/21: L 1 min 43 sec; 03/12/22: L 1 min 2 sec (requires non-skid surface to pick up small items from table top)   Time 12    Period Weeks    Status Revised/On-going    Target Date 04/25/22             Plan -     Clinical Impression Statement Pt tolerated all therapeutic exercises and neuro re-ed well this date.  Pt continues to show less fatigue  with forward reaching patterns, fewer rest breaks, and improved ROM when reaching to thread washers over vertical dowels.  Pt tends to require multiple attempts at picking up a washer from magnetic/resistive dish, specifically with the 1/4" washers.  Pt will continue to work towards goals in plan of care to improve left UE function, decrease pain and increase active normal movement patterns for daily tasks.    OT Occupational Profile and History Detailed Assessment- Review of Records and additional review of physical, cognitive, psychosocial history related to current functional performance    Occupational performance deficits (Please refer to evaluation for details): ADL's;IADL's;Leisure;Rest and Sleep    Body Structure / Function / Physical Skills ADL;Coordination;Endurance;GMC;UE functional use;Balance;IADL;Pain;Dexterity;FMC;Strength;Edema;Mobility;ROM    Psychosocial Skills Environmental  Adaptations;Habits;Routines and Behaviors    Rehab Potential Good    Clinical Decision Making Several treatment options, min-mod task modification necessary   Comorbidities Affecting Occupational Performance: May have comorbidities impacting occupational performance    Modification or Assistance to Complete Evaluation  Min-Moderate modification of tasks or assist with assess necessary to complete eval    OT Frequency 2x / week    OT Duration 12 weeks    OT Treatment/Interventions Self-care/ADL training;Cryotherapy;Paraffin;Therapeutic exercise;DME and/or AE instruction;Functional Mobility Training;Balance training;Electrical Stimulation;Ultrasound;Neuromuscular education;Manual Therapy;Splinting;Moist Heat;Contrast Bath;Passive range of motion;Therapeutic activities;Patient/family education;Coping strategies training    Plan OT recert    Consulted and Agree with Plan of Care Patient            Leta Speller, MS, OTR/L  Darleene Cleaver, OT 04/10/2022, 2:10 PM

## 2022-04-11 ENCOUNTER — Ambulatory Visit: Payer: Medicare PPO

## 2022-04-11 DIAGNOSIS — M6281 Muscle weakness (generalized): Secondary | ICD-10-CM | POA: Diagnosis not present

## 2022-04-11 DIAGNOSIS — R278 Other lack of coordination: Secondary | ICD-10-CM

## 2022-04-11 DIAGNOSIS — I63511 Cerebral infarction due to unspecified occlusion or stenosis of right middle cerebral artery: Secondary | ICD-10-CM

## 2022-04-12 ENCOUNTER — Other Ambulatory Visit: Payer: Self-pay | Admitting: Internal Medicine

## 2022-04-12 DIAGNOSIS — Z1231 Encounter for screening mammogram for malignant neoplasm of breast: Secondary | ICD-10-CM

## 2022-04-14 NOTE — Therapy (Signed)
OUTPATIENT OCCUPATIONAL THERAPY NEURO TREATMENT NOTE  Patient Name: Teresa [name] MRN: [id number] DOB:11-08-1940, 82 y.o., female Today's Date: 12/13/2021  PCP: Dr. [name] REFERRING PROVIDER: Dr. [name]   OT End of Session - 04/14/22 1148     Visit Number 89    Number of Visits 94    Date for OT Re-Evaluation 04/25/22    Authorization Time Period Reporting period beginning 03/12/22    OT Start Time 1015    OT Stop Time 1100    OT Time Calculation (min) 45 min    Equipment Utilized During Treatment cane    Activity Tolerance Patient tolerated treatment well    Behavior During Therapy WFL for tasks assessed/performed             Past Medical History:  Diagnosis Date   Cancer ([place])    skin   Hypothyroidism    Raynaud's disease    Stroke [facility])    Past Surgical History:  Procedure Laterality Date   [place]  2010   Cervical fusion C4-5-6-7   CATARACT EXTRACTION, BILATERAL Bilateral 10/2016   CHOLECYSTECTOMY  1995   COLONOSCOPY WITH PROPOFOL N/A 12/08/2018   Procedure: COLONOSCOPY WITH PROPOFOL;  Surgeon: Toledo, Benay Pike, MD;  Location: [facility] ENDOSCOPY;  Service: Gastroenterology;  Laterality: N/A;   EYE SURGERY     Patient Active Problem List   Diagnosis Date Noted   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   Stroke ([place]) 02/27/2021   History of nonmelanoma skin cancer 05/23/2014   ONSET DATE: 02/26/2021  REFERRING DIAG: R MCA CVA  THERAPY DIAG:  Muscle weakness (generalized)  Other lack of coordination  Right middle cerebral artery stroke [facility])  Rationale for Evaluation and Treatment Rehabilitation  PERTINENT HISTORY: February 26, 2021, pt reports she had a CVA, came to [facility] to ER and then was transferred to [facility] in [place] where she was admitted and after acute care she went to inpatient rehab.  Following inpt rehab, pt went home and had home  health.   PRECAUTIONS: fall  SUBJECTIVE:  Pt reports doing well today. PAIN:  Are you having pain? 0 OBJECTIVE:  L grip 6, R grip 41 L lateral pinch 5, R 13 L 3 point pinch 4, R 14  L 9 hole 1 min 43 sec. L shoulder active flexion 0-105, passive 0-125 02/05/22: L grip 13#  L lateral pinch 8#  3 point pinch 5#  L shoulder flex 0-108 03/12/22:  L grip: 12# L lateral pinch: 8# 3 point pinch: 6# L 9 hole: 1 min 3 sec L shoulder flexion 0-110  TODAY'S TREATMENT: Therapeutic Exercise: Performed standing pulley exercise for bilat shoulder flex/abd, min tactile cues for technique.  Performed passive stretching for L forearm pron/sup, L shoulder flexion, abd, and ER, working to increase joint ROM for engagement of LUE into daily tasks.   Performed L grip strengthening with use of hand gripper set at moderate resistance to perform 3 sets 10 reps of grip squeezes.  Completed another 2 sets 10 reps later in session, min tactile cues for increasing grip closure.  Performed resistive exercises with 2 lb dumbbell for L wrist flex/ext/RD, forearm pron/sup, elbow flex/ext, and 1# dumbbell for L shoulder flex, abd, ER with min guard x3 sets 10 reps each.   Neuro re-ed: Facilitated L hand FMC/dexterity skills working to pick up small pegs from non-skid surface and place into pegboard.  Pt required warm up of OT setting up pegs in pegboard then pt removed them, then pt was able to pick them up from the table and place into board.  Pt required intermittent warming of hand with hot pack for improved dexterity.  PATIENT EDUCATION: Education details: L hand coordination skills Person educated: pt Education method: Explanation and Verbal cues Education comprehension: verbalized understanding   HOME EXERCISE PROGRAM Continue to engage LUE into ADLs; continue gripping and pinching exercises for LUE, and L shoulder AROM/AAROM, crocheting, typing; increase participation in IADL tasks for greater use of L  hand.    OT Long Term Goals -       OT LONG TERM GOAL #1   Title Pt will be independent with home exercise program.    Baseline Eval: no current program, 10th visit:  continue to add new exercises as pt progresses, 20th:  continue to update HEP; 08/17/21: continue to progress HEP when indicated.  Visit 40: adding new exercises as pt progresses; 12/25/21: ongoing with progressions; 03/12/22: inconsistent use of putty but regular use of stress ball (every other day).  Encouraged pt return to daily putty use (2x daily when able) to target grip and pinch strengthening.   Time 12    Period Weeks    Status On-going    Target Date 04/24/22     OT LONG TERM GOAL #2   Title Pt will complete UB and LB dressing with modified independence including buttons, snaps and zippers.    Baseline requires min assist at eval, 10th visit: occasional assist with buttons, 20th:  able to perform one handed, but difficulty with bilateral UE; 08/17/21: pt reports inconsistent with 1 hand, reviewed techniques this visit, Visit 40:  Pt requires assist with bra   Time 12    Period Weeks    Status MET   Target Date 11/08/21     OT LONG TERM GOAL #3   Title Pt will perform shower transfer with modified independence.    Baseline Pt requires supervision to min assist for shower transfer at home. 10th visit: supervision; 08/17/21: supv .  Visit 40:  Pt had met goal but had recent fall and now requiring min guard to supv   Time 6    Period Weeks    Status  Met    Target Date 11/08/21      OT LONG TERM GOAL #4   Title Pt will improve L hand grip by 10# to assist with holding items in left hand securely.    Baseline no grip in left hand at eval, 10th visit:  improved flexion but still working towards composite fisting and grip. 20th:  continues to demo decreased grip; 08/17/21: active digit flexion improving, but not yet able to register grip on dynamometer; 09/04/21: L grip 1# 7/17:  5#; 12/25/21: L grip 6#; 02/05/22: 13#;  03/12/22: L grip 12#   Time 12    Period Weeks    Status On-going    Target Date 04/25/22     OT LONG TERM GOAL #5   Title Pt will improve left shoulder flexion to 100 degrees or better to improve reaching to obtain self care items from shelf/shoulder height.    Baseline difficulty with reach, shoulder flexion to 47 degrees; 08/17/21: L shoulder flexion 85, but not yet able to consistently hold ADL supplies in L hand when reaching; 09/04/21: flexion 85 7/17:  shoulder flexion to 90; 12/25/21: 105 (P 125); 03/12/22: active L shoulder flexion 110 (pt uses  R arm to reach for items at shoulder height or above)    Time 12    Period Weeks    Status On-going    Target Date 04/25/22     OT LONG TERM GOAL #6   Title Pt will improve FOTO score to 47 or above to demonstrate a clinically relevant change in function to impact ADL tasks.    Baseline score of 30 at eval; 08/17/21: FOTO: 42; 09/04/21: FOTO : 54 , FOTO 7/7:  60; 12/25/21: 59; 03/12/22: 66   Time 12    Period Weeks    Status  MET/ongoing    Target Date 04/25/22     OT LONG TERM GOAL #7   Title Pt will demonstrate ability to pick up small objects and complete 9 hole peg test in less than 1 min 30 sec (revised from under 2 min)   Baseline unable to perform at eval, 10th visit: still unable to pick up small pegs, 20th: unable to pick up pegs but improving; 08/17/21: not attempted d/t time constraints, will assess next visit; 09/04/21: 9 hole peg test in 5 min 53 sec;  7/17:  Pt unable to complete this date but was able to place 6 of 9 pegs in 4 mins 20 secs; 12/25/21: L 1 min 43 sec; 03/12/22: L 1 min 2 sec (requires non-skid surface to pick up small items from table top)   Time 12    Period Weeks    Status Revised/On-going    Target Date 04/25/22             Plan -     Clinical Impression Statement Pt tolerated all therapeutic exercises and neuro re-ed well this date.  Pt requiring fewer tactile cues to maintain form with shoulder pulleys.  Pt  requires min guard for L shoulder strengthening when using 1# weight.  Pt will continue to work towards goals in plan of care to improve left UE function, decrease pain and increase active normal movement patterns for daily tasks.    OT Occupational Profile and History Detailed Assessment- Review of Records and additional review of physical, cognitive, psychosocial history related to current functional performance    Occupational performance deficits (Please refer to evaluation for details): ADL's;IADL's;Leisure;Rest and Sleep    Body Structure / Function / Physical Skills ADL;Coordination;Endurance;GMC;UE functional use;Balance;IADL;Pain;Dexterity;FMC;Strength;Edema;Mobility;ROM    Psychosocial Skills Environmental  Adaptations;Habits;Routines and Behaviors    Rehab Potential Good    Clinical Decision Making Several treatment options, min-mod task modification necessary   Comorbidities Affecting Occupational Performance: May have comorbidities impacting occupational performance    Modification or Assistance to Complete Evaluation  Min-Moderate modification of tasks or assist with assess necessary to complete eval    OT Frequency 2x / week    OT Duration 12 weeks    OT Treatment/Interventions Self-care/ADL training;Cryotherapy;Paraffin;Therapeutic exercise;DME and/or AE instruction;Functional Mobility Training;Balance training;Electrical Stimulation;Ultrasound;Neuromuscular education;Manual Therapy;Splinting;Moist Heat;Contrast Bath;Passive range of motion;Therapeutic activities;Patient/family education;Coping strategies training    Plan OT recert    Consulted and Agree with Plan of Care Patient            Leta Speller, MS, OTR/L  Darleene Cleaver, OT 04/14/2022, 11:51 AM

## 2022-04-15 NOTE — Progress Notes (Signed)
[facility] Summary Report / Loop Recorder

## 2022-04-16 ENCOUNTER — Emergency Department: Payer: Medicare PPO

## 2022-04-16 ENCOUNTER — Other Ambulatory Visit: Payer: Self-pay

## 2022-04-16 ENCOUNTER — Emergency Department
Admission: EM | Admit: 2022-04-16 | Discharge: 2022-04-16 | Disposition: A | Discharge status: Home / Self Care | Payer: Medicare PPO | Attending: Emergency Medicine | Admitting: Emergency Medicine

## 2022-04-16 ENCOUNTER — Encounter: Payer: Self-pay | Admitting: Emergency Medicine

## 2022-04-16 ENCOUNTER — Ambulatory Visit: Payer: Medicare PPO

## 2022-04-16 DIAGNOSIS — Z1152 Encounter for screening for COVID-19: Secondary | ICD-10-CM | POA: Diagnosis not present

## 2022-04-16 DIAGNOSIS — I639 Cerebral infarction, unspecified: Secondary | ICD-10-CM | POA: Diagnosis not present

## 2022-04-16 DIAGNOSIS — M6281 Muscle weakness (generalized): Secondary | ICD-10-CM

## 2022-04-16 DIAGNOSIS — R531 Weakness: Secondary | ICD-10-CM | POA: Insufficient documentation

## 2022-04-16 DIAGNOSIS — I63511 Cerebral infarction due to unspecified occlusion or stenosis of right middle cerebral artery: Secondary | ICD-10-CM

## 2022-04-16 DIAGNOSIS — R278 Other lack of coordination: Secondary | ICD-10-CM

## 2022-04-16 LAB — COMPREHENSIVE METABOLIC PANEL
ALT: 18 U/L (ref 0–44)
AST: 26 U/L (ref 15–41)
Albumin: 4.2 g/dL (ref 3.5–5.0)
Alkaline Phosphatase: 72 U/L (ref 38–126)
Anion gap: 8 (ref 5–15)
BUN: 15 mg/dL (ref 8–23)
CO2: 28 mmol/L (ref 22–32)
Calcium: 9.5 mg/dL (ref 8.9–10.3)
Chloride: 103 mmol/L (ref 98–111)
Creatinine, Ser: 1.2 mg/dL — ABNORMAL HIGH (ref 0.44–1.00)
GFR, Estimated: 46 mL/min — ABNORMAL LOW (ref >60)
Glucose, Bld: 98 mg/dL (ref 70–99)
Potassium: 3.8 mmol/L (ref 3.5–5.1)
Sodium: 139 mmol/L (ref 135–145)
Total Bilirubin: 1 mg/dL (ref 0.3–1.2)
Total Protein: 8.3 g/dL — ABNORMAL HIGH (ref 6.5–8.1)

## 2022-04-16 LAB — PROTIME-INR
INR: 1 (ref 0.8–1.2)
Prothrombin Time: 13.5 seconds (ref 11.4–15.2)

## 2022-04-16 LAB — DIFFERENTIAL
Abs Immature Granulocytes: 0.01 10*3/uL (ref 0.00–0.07)
Basophils Absolute: 0.1 10*3/uL (ref 0.0–0.1)
Basophils Relative: 1 %
Eosinophils Absolute: 0.1 10*3/uL (ref 0.0–0.5)
Eosinophils Relative: 2 %
Immature Granulocytes: 0 %
Lymphocytes Relative: 31 %
Lymphs Abs: 2.1 10*3/uL (ref 0.7–4.0)
Monocytes Absolute: 0.5 10*3/uL (ref 0.1–1.0)
Monocytes Relative: 8 %
Neutro Abs: 3.9 10*3/uL (ref 1.7–7.7)
Neutrophils Relative %: 58 %

## 2022-04-16 LAB — URINALYSIS, ROUTINE W REFLEX MICROSCOPIC
Bilirubin Urine: NEGATIVE
Glucose, UA: NEGATIVE mg/dL
Hgb urine dipstick: NEGATIVE
Ketones, ur: NEGATIVE mg/dL
Leukocytes,Ua: NEGATIVE
Nitrite: NEGATIVE
Protein, ur: NEGATIVE mg/dL
Specific Gravity, Urine: 1.01 (ref 1.005–1.030)
pH: 7 (ref 5.0–8.0)

## 2022-04-16 LAB — CBC
HCT: 40.5 % (ref 36.0–46.0)
Hemoglobin: 13.5 g/dL (ref 12.0–15.0)
MCH: 29.2 pg (ref 26.0–34.0)
MCHC: 33.3 g/dL (ref 30.0–36.0)
MCV: 87.5 fL (ref 80.0–100.0)
Platelets: 217 10*3/uL (ref 150–400)
RBC: 4.63 MIL/uL (ref 3.87–5.11)
RDW: 13.2 % (ref 11.5–15.5)
WBC: 6.6 10*3/uL (ref 4.0–10.5)
nRBC: 0 % (ref 0.0–0.2)

## 2022-04-16 LAB — CBG MONITORING, ED: Glucose-Capillary: 108 mg/dL — ABNORMAL HIGH (ref 70–99)

## 2022-04-16 LAB — APTT: aPTT: 32 seconds (ref 24–36)

## 2022-04-16 LAB — RESP PANEL BY RT-PCR (FLU A&B, COVID) ARPGX2
Influenza A by PCR: NEGATIVE
Influenza B by PCR: NEGATIVE
SARS Coronavirus 2 by RT PCR: NEGATIVE

## 2022-04-16 LAB — ETHANOL: Alcohol, Ethyl (B): <10 mg/dL (ref <10)

## 2022-04-16 LAB — I-STAT CREATININE, ED: Creatinine, Ser: 1.3 mg/dL — ABNORMAL HIGH (ref 0.44–1.00)

## 2022-04-16 MED ORDER — SODIUM CHLORIDE 0.9% FLUSH
3.0000 mL | Freq: Once | INTRAVENOUS | Status: AC
  Administered 2022-04-16: 3 mL via INTRAVENOUS

## 2022-04-16 MED ORDER — IOHEXOL 350 MG/ML SOLN
100.0000 mL | Freq: Once | INTRAVENOUS | Status: AC | PRN
  Administered 2022-04-16: 100 mL via INTRAVENOUS

## 2022-04-16 NOTE — ED Triage Notes (Signed)
Patient to ED from rehab in hospital for stroke like symptoms. [place] 10pm last night when went to bed. C/o slurred speech, left sided facial droop, left sided weakness. Hx of stroke on that left side but these symptoms are new from old stroke.

## 2022-04-16 NOTE — Consult Note (Signed)
Telestroke cart was activated at 1115. Per treatment team, pt's LKW was at 2200 last night with c/o L sided weakness and facial droop. Pt transported to CT prior to cart activation. Dr. Leonel Ramsay was in CT for code stroke at 1120. Based on Dr. Cecil Cobbs assessment, pt does not meet criteria for emergent interventions at this time 2/2 out of the window. Neurologist to f/u with EDP regarding recommendations. Per Dr. Leonel Ramsay, no further needs from Telestroke RN at this time. Telestroke cart disconnected at 1129.

## 2022-04-16 NOTE — Code Documentation (Signed)
Stroke Response Nurse Documentation Code Documentation  Teresa [name] is a 82 y.o. female arriving to [facility] via [employer] on 04/16/2022 with past medical hx of raynauds disease, hypothyroidism, cancer, stroke. On No antithrombotic. Code stroke was activated by ED.   Patient from home where she was LKW 1/22 at 1100. Per husband, patient was noted to be dragging her left leg while at the bank starting at 1100 yesterday. While at rehab today, patient was noted to have slurred speech and facial droop and was brought in for eval.   Stroke team met patient in CT. Labs drawn in triage. Patient to CT with team. NIHSS 5, see documentation for details and code stroke times. Patient with left facial droop, left arm weakness, right leg weakness, left decreased sensation, and dysarthria  on exam. The following imaging was completed:  CT Head, CTA, and CTP. Patient is not a candidate for IV Thrombolytic due to outside window, per MD. Patient is not a candidate for IR due to no LVO seen on imaging, per MD.   Care Plan: q2h NIHSS and vital signs.   Bedside handoff with ED RN Stanton Kidney.    Charise Carwin  Stroke Response RN

## 2022-04-16 NOTE — ED Notes (Signed)
Patient states she has a heart monitor chip

## 2022-04-16 NOTE — ED Notes (Signed)
Patient off the floor to MRI 

## 2022-04-16 NOTE — ED Notes (Signed)
Pateint off the floor to MRI at time of NIH check

## 2022-04-16 NOTE — ED Provider Notes (Signed)
[facility] Provider Note   Event Date/Time   First MD Initiated Contact with Patient 04/16/22 1122     (approximate)  History   Code Stroke   HPI  Teresa [name] is a 82 y.o. female history of previous stroke  Patient and her husband report yesterday at about 70 AM when she went to the bank they noticed that it seemed like she was shuffling her left leg a little bit more than typical.  She felt a little bit unusual and then when she woke up this morning she went to her stroke rehab visit and they were concerned as it seemed like her speech was more slurred and she has noticed some increased weakness in her left arm and left leg and particularly about the last 24 hours.  No chest pain no trouble breathing.  No recent illness no fevers no chills no burning with urination.  She reports she is otherwise been in fantastic health   Per triage note patient has a last known well at 10 PM.  Now presenting with slurred speech left-sided facial droop and weakness.  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 ----------------------------------------- 11:40 AM on 04/16/2022 ----------------------------------------- Awaiting arrival of patient to ED treatment bed.  I have not yet seen the patient, she is currently in CT scanner with the stroke neurology team.  Physical Exam   Triage Vital Signs: ED Triage Vitals [04/16/22 1120]  Enc Vitals Group     BP (!) 153/78     Pulse Rate 85     Resp 18     Temp      Temp src      SpO2 99 %     Weight      Height      Head Circumference      Peak Flow      Pain Score 0     Pain Loc      Pain Edu?      Excl. in [place]?     Most recent vital signs: Vitals:   04/16/22 1400 04/16/22 1430  BP: (!) 139/90 (!) 120/96  Pulse: 75 73  Resp: 17 (!) 24  Temp: 97.7 F (36.5 C)   SpO2: 100% 100%      General: Awake, no distress.  Very pleasant.  No distress. CV:  Good peripheral perfusion.  Normal tones and rate Resp:  Normal effort.  Clear lungs bilaterally Abd:  No distention.  Soft nontender Other:  Patient with moderate weakness left side of the face, speech is just slightly slurred.  Exhibits moderate weakness in the left arm and left leg.  Defer to Dr. Leonel Ramsay for full NIH which she has already performed   ED Results / Procedures / Treatments   Labs (all labs ordered are listed, but only abnormal results are displayed) Labs Reviewed  COMPREHENSIVE METABOLIC PANEL - Abnormal; Notable for the following components:      Result Value   Creatinine, Ser 1.20 (*)    Total Protein 8.3 (*)    GFR, Estimated 46 (*)    All other components within normal limits  URINALYSIS, ROUTINE W REFLEX MICROSCOPIC - Abnormal; Notable for the following components:   Color, Urine STRAW (*)    APPearance CLEAR (*)    All other components within normal limits  I-STAT CREATININE, ED - Abnormal; Notable for the following  components:   Creatinine, Ser 1.30 (*)    All other components within normal limits  CBG MONITORING, ED - Abnormal; Notable for the following components:   Glucose-Capillary 108 (*)    All other components within normal limits  RESP PANEL BY RT-PCR (FLU A&B, COVID) ARPGX2  PROTIME-INR  APTT  CBC  DIFFERENTIAL  ETHANOL     EKG  Interpreted me at 1150 heart rate 80 QRS 90 QTc 460 Normal sinus rhythm no evidence of acute ischemia   RADIOLOGY CT head without contrast interpreted by me at 11:42 AM, I do not see evidence of acute hemorrhage on my gross review, but there does appear to be evidence of an old infarct involving the right middle  MR BRAIN WO CONTRAST  Result Date: 04/16/2022 CLINICAL DATA:  Stroke suspected EXAM: MRI HEAD WITHOUT CONTRAST TECHNIQUE: Multiplanar, multiecho pulse sequences of the brain and surrounding structures were obtained without  intravenous contrast. COMPARISON:  MRI Head 02/26/21, CT Head 04/16/22 FINDINGS: Brain: Evolving right MCA territory infarct involving the right frontal operculum in the right insular cortex. No acute infarction, hemorrhage, hydrocephalus, extra-axial collection or mass lesion. Small focus of microhemorrhage in the left temporal lobe. There is petechial hemorrhage and/or hemosiderosis in the region of prior right MCA territory infarct. T2/stir hyperintense signal in the right cerebral peduncle is favored to represent transneuronal degeneration. Vascular: Major flow voids are preserved. Skull and upper cervical spine: Normal marrow signal. Sinuses/Orbits: Bilateral lens replacement. No mastoid or middle ear effusion. Paranasal sinuses are clear. Other: None. IMPRESSION: 1. No acute intracranial abnormality. 2. 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 3. Electronically Signed   By: Marin Roberts M.D.   On: 04/16/2022 13:41   CT ANGIO HEAD NECK W WO CM W PERF (CODE STROKE)  Result Date: 04/16/2022 CLINICAL DATA:  Stroke suspected, left-sided facial droop and left-sided weakness EXAM: CT ANGIOGRAPHY HEAD AND NECK CT PERFUSION BRAIN TECHNIQUE: Multidetector CT imaging of the head and neck was performed using the standard protocol during bolus administration of intravenous contrast. Multiplanar CT image reconstructions and MIPs were obtained to evaluate the vascular anatomy. Carotid stenosis measurements (when applicable) are obtained utilizing NASCET criteria, using the distal internal carotid diameter as the denominator. Multiphase CT imaging of the brain was performed following IV bolus contrast injection. Subsequent parametric perfusion maps were calculated using RAPID software. RADIATION DOSE REDUCTION: This exam was performed according to the departmental dose-optimization program which includes automated exposure control, adjustment of the mA and/or kV  according to patient size and/or use of iterative reconstruction technique. CONTRAST:  14m OMNIPAQUE IOHEXOL 350 MG/ML SOLN COMPARISON:  02/26/2021 CTA head and neck, correlation is also made with 04/16/2022 CT head FINDINGS: CT HEAD FINDINGS For noncontrast findings, please see same day CT head. CTA NECK FINDINGS Aortic arch: Not included in the field of view. Right carotid system: No evidence of dissection, occlusion, or hemodynamically significant stenosis (greater than 50%). Left carotid system: No evidence of dissection, occlusion, or hemodynamically significant stenosis (greater than 50%). Vertebral arteries: No evidence of dissection, occlusion, or hemodynamically significant stenosis (greater than 50%). Skeleton: No acute osseous abnormality.  Status post ACDF C4-C7. Other neck: Negative. Upper chest: Negative. Review of the MIP images confirms the above findings CTA HEAD FINDINGS Anterior circulation: Both internal carotid arteries are patent to the termini, without significant stenosis. A1 segments patent. Normal anterior communicating artery. Anterior cerebral arteries are patent to their distal aspects. No M1 stenosis  or occlusion. A previously noted occlusion in a right MCA branch in the sylvian fissure now demonstrates minimal flow (series 7, images 81-89), likely severe stenosis, with attenuated downstream flow. Right MCA branches otherwise perfused without significant stenosis. Left MCA branches are perfused without significant stenosis. Posterior circulation: Vertebral arteries patent to the vertebrobasilar junction without stenosis. Posterior inferior cerebellar arteries patent proximally. Basilar patent to its distal aspect. Superior cerebellar arteries patent proximally. Patent right P1. Aplastic left P1 with fetal origin of the left PCA from the left posterior communicating artery. PCAs perfused to their distal aspects without stenosis. The right posterior communicating artery is not  visualized. Venous sinuses: As permitted by contrast timing, patent. Anatomic variants: Fetal origin of the left PCA Review of the MIP images confirms the above findings CT Brain Perfusion Findings: ASPECTS: 7 CBF (<30%) Volume: 72m Perfusion (Tmax>6.0s) volume: 071mMismatch Volume: 64m93mnfarction Location:None IMPRESSION: 1. 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 2. No other intracranial large vessel occlusion or significant stenosis. 3. No hemodynamically significant stenosis in the neck. 4. No evidence of core infarct or penumbra on CT perfusion. Electronically Signed   By: AliMerilyn BabaD.   On: 04/16/2022 12:12   CT HEAD CODE STROKE WO CONTRAST  Result Date: 04/16/2022 CLINICAL DATA:  Code stroke. Left-sided facial droop and left-sided weakness EXAM: CT HEAD WITHOUT CONTRAST TECHNIQUE: Contiguous axial images were obtained from the base of the skull through the vertex without intravenous contrast. RADIATION DOSE REDUCTION: This exam was performed according to the departmental dose-optimization program which includes automated exposure control, adjustment of the mA and/or kV according to patient size and/or use of iterative reconstruction technique. COMPARISON:  MRI Brain 02/26/21, CTA 02/26/21 FINDINGS: Brain: Evolving right MCA territory infarct involving the insula and right frontal operculum. No hemorrhage. No extra-axial fluid collection. No hydrocephalus. No CT evidence of a new infarct. Vascular: No new hyperdense vessel or unexpected calcification. Persistent slightly hyperdense vessel in the right sylvian fissure. Skull: Normal. Negative for fracture or focal lesion. Sinuses/Orbits: No acute finding. Other: None. ASPECTS ([facility] Early CT Score): 7 IMPRESSION: Evolving right MCA territory infarct involving the insula and right frontal operculum. No hemorrhage or  CT evidence of a new infarct. Findings were paged to Dr. KirLeonel Ramsay 04/16/22 at 11:39 AM via [facility]. Electronically Signed   By: HemMarin RobertsD.   On: 04/16/2022 11:39    PROCEDURES:  Critical Care performed: Yes, see critical care procedure note(s)  Procedures  CRITICAL CARE Performed by: MarDelman KittenTotal critical care time: 20 minutes  Critical care time was exclusive of separately billable procedures and treating other patients.  Critical care was necessary to treat or prevent imminent or life-threatening deterioration.  Critical care was time spent personally by me on the following activities: development of treatment plan with patient and/or surrogate as well as nursing, discussions with consultants, evaluation of patient's response to treatment, examination of patient, obtaining history from patient or surrogate, ordering and performing treatments and interventions, ordering and review of laboratory studies, ordering and review of radiographic studies, pulse oximetry and re-evaluation of patient's condition.   MEDICATIONS ORDERED IN ED: Medications  sodium chloride flush (NS) 0.9 % injection 3 mL (3 mLs Intravenous Given by Other 04/16/22 1437)  iohexol (OMNIPAQUE) 350 MG/ML injection 100 mL (100 mLs Intravenous Contrast Given 04/16/22 1129)  IMPRESSION / MDM / ASSESSMENT AND PLAN / ED COURSE  I reviewed the triage vital signs and the nursing notes.                              Differential diagnosis includes, but is not limited to, possible acute stroke being evaluated for acute code stroke by neurology at the time my evaluation.  Also considerations would be recrudescence, other central neurologic causation etc.  She seems to be in normal health denies any preceding systemic or infectious symptoms but rather noticed 11 AM yesterday some increased weakness in the left leg which progressed and with slurred speech and increased weakness in the left arm and  left leg today at her rehab session.  She is currently awaiting MRI of the brain without contrast.  Cardiac monitoring device with MRI Did discuss that she has an implanted device, Logan (MRI tech), who will further evaluate it is particular conditions   Patient's presentation is most consistent with acute presentation with potential threat to life or bodily function.     Labs reviewed reassuring.  No evidence of acute abnormality and metabolic panel, no evidence of UTI.  No acute cardiopulmonary symptoms.  Workup quite reassuring.   ----------------------------------------- 11:47 AM on 04/16/2022 ----------------------------------------- Case discussed with Dr. Leonel Ramsay who is seen and evaluated the patient.  He advises possible new stroke versus recrudescence.  Recommends consider evaluation for possible metabolic demand increase such as evaluate exclude urinary tract infection etc.  At this point he does not recommend we obtain a stat MRI of the brain without contrast which I have now ordered   ----------------------------------------- 3:17 PM on 04/16/2022 ----------------------------------------- Reviewed findings and reassuring imaging with patient.  Discussed with Dr. Leonel Ramsay to the patient's CTA and MRI findings he advises the patient may be discharged to follow-up.  I am in agreement with this as well.  Appears to have a case of recrudescence with an unclear etiology but no evidence of an acute medical condition or new stroke.  Patient's occupational therapy team, Otho Ket, also at the bedside discussing plans for ongoing treatments and therapy.  Patient and her husband advised that she does not have any additional assistive needs at the house, she uses a walker and feels comfortable so doing  Return precautions and treatment recommendations and follow-up discussed with the patient who is agreeable with the plan.   FINAL CLINICAL IMPRESSION(S) / ED DIAGNOSES   Final diagnoses:   Left-sided weakness     Rx / DC Orders   ED Discharge Orders     None        Note:  This document was prepared using Dragon voice recognition software and may include unintentional dictation errors.   Delman Kitten, MD 04/16/22 [phone]

## 2022-04-16 NOTE — ED Notes (Signed)
Patient husband states patient woke up this morning stating she didn't feel good. Patient was able to eat breakfast before going to therapy. Patient told husband her left arm hurt and felt weak. Yesterday patient was able to tie her shoes, today her husband had to assist her. Patient in CT at this time.

## 2022-04-16 NOTE — ED Notes (Signed)
Code  stroke   called  to  [facility]   per  Caryl Pina  RN

## 2022-04-16 NOTE — Consult Note (Signed)
Neurology Consultation Reason for Consult: Left sided weakness Referring Physician: Quale, M  CC: Left sided weakness  History is obtained from:patient  HPI: Teresa [name] is a 82 y.o. female  with a history of R M2 infarct who presents with worsening left sided weakness.  She was excited yesterday because she was able to tie her shoe for the first time since her stroke in December 2022, but then over the course of the morning her husband noticed that she was not acting quite right, and seemed to be dragging her left leg low bit more than she typically does.  The patient states that she still felt relatively normal until she went to bed around 10 PM, but then was not feeling right when she woke up this morning.  Her symptoms are similar to previous stroke, with worsening of her left-sided deficits.   LKW: 1/22, symptom in the morning tnk given?: no, out of window Premorbid modified rankin scale: 3   Past Medical History:  Diagnosis Date   Cancer ([place])    skin   Hypothyroidism    Raynaud's disease    Stroke ([place])      Family History  Problem Relation Age of Onset   Breast cancer Neg Hx      Social History:  reports that she has never smoked. She has never used smokeless tobacco. She reports that she does not drink alcohol and does not use drugs.   Exam: Current vital signs: BP (!) 153/78   Pulse 85   Resp 18   SpO2 99%  Vital signs in last 24 hours: Pulse Rate:  [85] 85 (01/23 1120) Resp:  [18] 18 (01/23 1120) BP: (153)/(78) 153/78 (01/23 1120) SpO2:  [99 %] 99 % (01/23 1120)   Physical Exam  Appears well-developed and well-nourished.   Neuro: Mental Status: Patient is awake, alert, oriented to person, place, month, year, and situation. Patient is able to give a clear and coherent history. No signs of aphasia or neglect Cranial Nerves: II: Visual Fields are full. Pupils are equal, round, and reactive to light.   III,IV, VI: EOMI without ptosis or  diploplia.  V: Facial sensation is diminished on the left VII: Facial movement with left facial weakness VIII: hearing is intact to voice X: Uvula elevates symmetrically XI: Shoulder shrug is symmetric. XII: tongue is midline without atrophy or fasciculations.  Motor: She has an increased tone with some weakness on the left side, though she does not drift. Sensory: Sensation is symmetric to light touch and temperature in the arms and legs. Cerebellar: FNF and HKS are intact bilaterally   I have reviewed labs in epic and the results pertinent to this consultation are: CBG 108 I-STAT creatinine 1.3  I have reviewed the images obtained: CT/CTA-no acute findings  Impression: 82 year old female with worsening of her left-sided deficits in the setting of previous stroke.  Unclear if this represents recrudescence versus new ischemic stroke, and therefore MRI would be helpful.  If MRI is positive, she will need to be admitted for therapy evaluations, and risk factor modification.  Recommendations: 1) MRI brain without contrast 2) further recommendations pending above study   Roland Rack, MD [facility] [phone]  If 7pm- 7am, please page neurology on call as listed in [place].

## 2022-04-16 NOTE — Therapy (Addendum)
OUTPATIENT OCCUPATIONAL THERAPY NEURO PROGRESS AND TREATMENT NOTE         Reporting period beginning 03/12/22-04/16/22 Patient Name: Teresa [name] MRN: [id number] DOB:11/26/1940, 82 y.o., female Today's Date: 12/13/2021  PCP: Dr. [name] REFERRING PROVIDER: Dr. [name]   OT End of Session - 04/16/22 1024     Visit Number 90    Number of Visits 94    Date for OT Re-Evaluation 04/25/22    Authorization Time Period Reporting period beginning 03/12/22    OT Start Time 1015    OT Stop Time 1100    OT Time Calculation (min) 45 min    Equipment Utilized During Treatment cane    Activity Tolerance Patient tolerated treatment well    Behavior During Therapy WFL for tasks assessed/performed             Past Medical History:  Diagnosis Date   Cancer ([place])    skin   Hypothyroidism    Raynaud's disease    Stroke [facility])    Past Surgical History:  Procedure Laterality Date   [place]  2010   Cervical fusion C4-5-6-7   CATARACT EXTRACTION, BILATERAL Bilateral 10/2016   CHOLECYSTECTOMY  1995   COLONOSCOPY WITH PROPOFOL N/A 12/08/2018   Procedure: COLONOSCOPY WITH PROPOFOL;  Surgeon: Toledo, Benay Pike, MD;  Location: [facility] ENDOSCOPY;  Service: Gastroenterology;  Laterality: N/A;   EYE SURGERY     Patient Active Problem List   Diagnosis Date Noted   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  Following inpt rehab, pt went home and had home health.   PRECAUTIONS: fall  SUBJECTIVE:  Pt reports feeling "funky" today.  Pt reports increased dragging of L foot and feeling like her lip was more swollen, L eye more droopy.   PAIN:  Are you having pain? 0 OBJECTIVE:  L grip 6, R grip 41 L lateral pinch 5, R 13 L 3 point pinch 4, R 14  L 9 hole 1 min 43 sec. L shoulder active flexion 0-105, passive 0-125 02/05/22: L grip 13#  L lateral pinch 8#  3 point pinch 5#  L shoulder flex 0-108 03/12/22:  L grip: 12# L lateral pinch: 8# 3 point pinch: 6# L 9 hole: 1 min 3 sec L shoulder flexion 0-110 04/16/22: L grip: 3# L lateral pinch: 4# 3 point pinch: difficulty maintaining 3 point pinch  L 9 hole: 5 pegs in 2 min 36 sec  L shoulder flexion 0-107 BP L 132/79 HR 80 02 sats 96%  TODAY'S TREATMENT: Therapeutic Exercise: Objective measures taken for progress update and new report of weakness throughout the L side upper and lower.  VS taken d/t pt presenting with increased weakness on the L side.  VS baseline for pt with BP on the L 132/79, HR 80, 02 sats 96% on room air.  OT called spouse to meet pt at the ED  d/t noted decline in strength/coordination measurements.  Pt declined wc transport despite increased dragging of L foot.  OT provided min guard to amb from outpatient to ED.   OT phoned spouse to meet pt at the ED.  PATIENT EDUCATION: Education details: Reviewed S/S of possible stroke Person educated: pt Education method: Merchandiser, retail cues Education comprehension: verbalized understanding   HOME EXERCISE PROGRAM Continue to engage LUE into ADLs; continue gripping and pinching exercises for LUE, and L shoulder AROM/AAROM, crocheting, typing; increase participation in IADL tasks for greater use of L hand.    OT Long Term Goals -       OT LONG TERM GOAL #1   Title Pt will be independent with home exercise program.    Baseline Eval: no  current program, 10th visit:  continue to add new exercises as pt progresses, 20th:  continue to update HEP; 08/17/21: continue to progress HEP when indicated.  Visit 40: adding new exercises as pt progresses; 12/25/21: ongoing with progressions; 03/12/22: inconsistent use of putty but regular use of stress ball (every other day).  Encouraged pt return to daily putty use (2x daily when able) to target grip and pinch strengthening; 04/16/22: not reviewed today d/t OT sent pt to ED   Time 12    Period Weeks    Status On-going    Target Date 04/24/22     OT LONG TERM GOAL #2   Title Pt will complete UB and LB dressing with modified independence including buttons, snaps and zippers.    Baseline requires min assist at eval, 10th visit: occasional assist with buttons, 20th:  able to perform one handed, but difficulty with bilateral UE; 08/17/21: pt reports inconsistent with 1 hand, reviewed techniques this visit, Visit 40:  Pt requires assist with bra   Time 12    Period Weeks    Status MET   Target Date 11/08/21     OT LONG TERM GOAL #3   Title Pt will perform shower transfer with modified independence.    Baseline Pt requires supervision to min assist for shower transfer at home. 10th visit: supervision; 08/17/21: supv .  Visit 40:  Pt had met goal but had recent fall and now requiring min guard to supv   Time 6    Period Weeks    Status  Met    Target Date 11/08/21      OT LONG TERM GOAL #4   Title Pt will improve L hand grip by 10# to assist with holding items in left hand securely.    Baseline no grip in left hand at eval, 10th visit:  improved flexion but still working towards composite fisting and grip. 20th:  continues to demo decreased grip; 08/17/21: active digit flexion improving, but not yet able to register grip on dynamometer; 09/04/21: L grip 1# 7/17:  5#; 12/25/21: L grip 6#; 02/05/22: 13#; 03/12/22: L grip 12#; 1/23//24: L grip 3# (decreased)   Time 12    Period Weeks    Status On-going     Target Date 04/25/22     OT LONG TERM GOAL #5   Title Pt will improve left shoulder flexion to 100 degrees or better to improve reaching to obtain self care items from shelf/shoulder height.    Baseline difficulty with reach, shoulder flexion to 47 degrees; 08/17/21: L shoulder flexion 85, but not yet able to consistently hold ADL supplies in L hand when reaching; 09/04/21: flexion 85 7/17:  shoulder flexion to 90;  12/25/21: 105 (P 125); 03/12/22: active L shoulder flexion 110 (pt uses R arm to reach for items at shoulder height or above); 04/16/22: L shoulder flexion 107   Time 12    Period Weeks    Status On-going    Target Date 04/25/22     OT LONG TERM GOAL #6   Title Pt will improve FOTO score to 47 or above to demonstrate a clinically relevant change in function to impact ADL tasks.    Baseline score of 30 at eval; 08/17/21: FOTO: 42; 09/04/21: FOTO : 54 , FOTO 7/7:  60; 12/25/21: 59; 03/12/22: 66; 04/16/22: 67   Time 12    Period Weeks    Status  MET/ongoing    Target Date 04/25/22     OT LONG TERM GOAL #7   Title Pt will demonstrate ability to pick up small objects and complete 9 hole peg test in less than 1 min 30 sec (revised from under 2 min)   Baseline unable to perform at eval, 10th visit: still unable to pick up small pegs, 20th: unable to pick up pegs but improving; 08/17/21: not attempted d/t time constraints, will assess next visit; 09/04/21: 9 hole peg test in 5 min 53 sec;  7/17:  Pt unable to complete this date but was able to place 6 of 9 pegs in 4 mins 20 secs; 12/25/21: L 1 min 43 sec; 03/12/22: L 1 min 2 sec (requires non-skid surface to pick up small items from table top); 04/16/22: 5 pegs in 2 min 36 secs   Time 12    Period Weeks    Status Revised/On-going    Target Date 04/25/22             Plan -     Clinical Impression Statement Objective measures taken for progress update and new report of weakness throughout the L side upper and lower.  VS taken d/t pt presenting with  increased weakness on the L side.  VS baseline for pt with BP on the L 132/79, HR 80, 02 sats 96% on room air.  OT called spouse to meet pt at the ED d/t noted decline in strength/coordination measurements.  Pt declined wc transport despite increased dragging of L foot.  OT provided min guard to amb from outpatient to ED; pt is typically ambulatory with modified indep with cane.   OT phoned spouse to meet pt at the ED.  Will follow up with pt next session re: findings.  Pt will continue to work towards goals in plan of care to improve left UE function, decrease pain and increase active normal movement patterns for daily tasks.    OT Occupational Profile and History Detailed Assessment- Review of Records and additional review of physical, cognitive, psychosocial history related to current functional performance    Occupational performance deficits (Please refer to evaluation for details): ADL's;IADL's;Leisure;Rest and Sleep    Body Structure / Function / Physical Skills ADL;Coordination;Endurance;GMC;UE functional use;Balance;IADL;Pain;Dexterity;FMC;Strength;Edema;Mobility;ROM    Psychosocial Skills Environmental  Adaptations;Habits;Routines and Behaviors    Rehab Potential Good    Clinical Decision Making Several treatment options, min-mod task modification necessary   Comorbidities Affecting Occupational Performance: May have comorbidities impacting occupational performance    Modification or Assistance to Complete Evaluation  Min-Moderate modification of tasks or assist with assess necessary to complete eval    OT Frequency 2x / week    OT Duration 12 weeks    OT Treatment/Interventions Self-care/ADL training;Cryotherapy;Paraffin;Therapeutic exercise;DME and/or AE instruction;Functional Mobility Training;Balance  training;Electrical Stimulation;Ultrasound;Neuromuscular education;Manual Therapy;Splinting;Moist Heat;Contrast Bath;Passive range of motion;Therapeutic activities;Patient/family  education;Coping strategies training    Plan OT recert    Consulted and Agree with Plan of Care Patient            Leta Speller, MS, OTR/L  Darleene Cleaver, OT 04/16/2022, 3:30 PM

## 2022-04-18 ENCOUNTER — Ambulatory Visit: Payer: Medicare PPO

## 2022-04-18 DIAGNOSIS — R278 Other lack of coordination: Secondary | ICD-10-CM

## 2022-04-18 DIAGNOSIS — I63511 Cerebral infarction due to unspecified occlusion or stenosis of right middle cerebral artery: Secondary | ICD-10-CM

## 2022-04-18 DIAGNOSIS — M6281 Muscle weakness (generalized): Secondary | ICD-10-CM | POA: Diagnosis not present

## 2022-04-19 ENCOUNTER — Ambulatory Visit: Payer: Medicare PPO

## 2022-04-19 NOTE — Therapy (Signed)
OUTPATIENT OCCUPATIONAL THERAPY NEURO TREATMENT NOTE          Patient Name: Teresa [name] MRN: [id number] DOB:06-26-1940, 82 y.o., female Today's Date: 12/13/2021  PCP: Dr. [name] REFERRING PROVIDER: Dr. [name]   OT End of Session - 04/19/22 0854     Visit Number 91    Number of Visits 67    Date for OT Re-Evaluation 04/25/22    Authorization Time Period Reporting period beginning 04/16/22    OT Start Time 1100    OT Stop Time 1145    OT Time Calculation (min) 45 min    Equipment Utilized During Treatment cane    Activity Tolerance Patient tolerated treatment well    Behavior During Therapy WFL for tasks assessed/performed             Past Medical History:  Diagnosis Date   Cancer ([place])    skin   Hypothyroidism    Raynaud's disease    Stroke [facility])    Past Surgical History:  Procedure Laterality Date   [place]  2010   Cervical fusion C4-5-6-7   CATARACT EXTRACTION, BILATERAL Bilateral 10/2016   CHOLECYSTECTOMY  1995   COLONOSCOPY WITH PROPOFOL N/A 12/08/2018   Procedure: COLONOSCOPY WITH PROPOFOL;  Surgeon: Toledo, Benay Pike, MD;  Location: [facility] ENDOSCOPY;  Service: Gastroenterology;  Laterality: N/A;   EYE SURGERY     Patient Active Problem List   Diagnosis Date Noted   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  Following inpt rehab, pt went home and had home  health.   PRECAUTIONS: fall  SUBJECTIVE:  Pt reports she's still feeling weak but was cleared at the ER for any signs of a new stroke and was negative for flu and covid.  Pt reports she's reached out to PCP to discuss what might be the cause of her weakness.  Pt was wondering if it could be related to a recent medication change.   PAIN:  Are you having pain? 0 OBJECTIVE:  L grip 6, R grip 41 L lateral pinch 5, R 13 L 3 point pinch 4, R 14  L 9 hole 1 min 43 sec. L shoulder active flexion 0-105, passive 0-125 02/05/22: L grip 13#  L lateral pinch 8#  3 point pinch 5#  L shoulder flex 0-108 03/12/22:  L grip: 12# L lateral pinch: 8# 3 point pinch: 6# L 9 hole: 1 min 3 sec L shoulder flexion 0-110 04/16/22: L grip: 3# L lateral pinch: 4# 3 point pinch: difficulty maintaining 3 point pinch  L 9 hole: 5 pegs in 2 min 36 sec  L shoulder flexion 0-107 BP L 132/79 HR 80 02 sats 96%  TODAY'S TREATMENT: Self Care: Practiced with clothing fasteners, tying large laces at table top, then transitioned to tying L shoe lace with foot propped on mat table.  Pt required min vc initially for engaging the  L hand with increased pinch to pull the laces tight.  Pt able to tie both laces 2-3 times, engaging the L hand for at least 25% of the task.  Practiced with large buttons, min vc for technique, extra time required.  Therapeutic Exercise: Performed passive stretching for L shoulder flex/abd/ER and forearm pron/sup, working to increase LUE ROM for ADLs.  Facilitated L grip strengthening with hand gripper set at min resistance with 1 red band to complete 3 sets 10 reps beginning of session, and 2 more sets end of session.  Rest between sets and cues for technique to maximize hand closure with each squeeze.  Facilitated pinch strengthening with use of therapy resistant clothespins to target lateral and 3 point pinch on L hand.  Able to pinch yellow, red and green clips with intermittent min vc for  eliciting pinch patterns.    PATIENT EDUCATION: Education details: engaging the L hand with manipulation of clothing fasteners Person educated: pt Education method: Explanation and Verbal cues Education comprehension: verbalized understanding   HOME EXERCISE PROGRAM Continue to engage LUE into ADLs; continue gripping and pinching exercises for LUE, and L shoulder AROM/AAROM, crocheting, typing; increase participation in IADL tasks for greater use of L hand.    OT Long Term Goals -       OT LONG TERM GOAL #1   Title Pt will be independent with home exercise program.    Baseline Eval: no current program, 10th visit:  continue to add new exercises as pt progresses, 20th:  continue to update HEP; 08/17/21: continue to progress HEP when indicated.  Visit 40: adding new exercises as pt progresses; 12/25/21: ongoing with progressions; 03/12/22: inconsistent use of putty but regular use of stress ball (every other day).  Encouraged pt return to daily putty use (2x daily when able) to target grip and pinch strengthening; 04/16/22: not reviewed today d/t OT sent pt to ED   Time 12    Period Weeks    Status On-going    Target Date 04/24/22     OT LONG TERM GOAL #2   Title Pt will complete UB and LB dressing with modified independence including buttons, snaps and zippers.    Baseline requires min assist at eval, 10th visit: occasional assist with buttons, 20th:  able to perform one handed, but difficulty with bilateral UE; 08/17/21: pt reports inconsistent with 1 hand, reviewed techniques this visit, Visit 40:  Pt requires assist with bra   Time 12    Period Weeks    Status MET   Target Date 11/08/21     OT LONG TERM GOAL #3   Title Pt will perform shower transfer with modified independence.    Baseline Pt requires supervision to min assist for shower transfer at home. 10th visit: supervision; 08/17/21: supv .  Visit 40:  Pt had met goal but had recent fall and now requiring min guard to supv   Time  6    Period Weeks    Status  Met    Target Date 11/08/21      OT LONG TERM GOAL #4   Title Pt will improve L hand grip by 10# to assist with holding items in left hand securely.    Baseline no grip in left hand at eval, 10th visit:  improved flexion but still working towards composite fisting and grip. 20th:  continues to demo decreased grip; 08/17/21: active digit flexion improving, but not yet able to register grip on dynamometer; 09/04/21: L grip  1# 7/17:  5#; 12/25/21: L grip 6#; 02/05/22: 13#; 03/12/22: L grip 12#; 1/23//24: L grip 3# (decreased)   Time 12    Period Weeks    Status On-going    Target Date 04/25/22     OT LONG TERM GOAL #5   Title Pt will improve left shoulder flexion to 100 degrees or better to improve reaching to obtain self care items from shelf/shoulder height.    Baseline difficulty with reach, shoulder flexion to 47 degrees; 08/17/21: L shoulder flexion 85, but not yet able to consistently hold ADL supplies in L hand when reaching; 09/04/21: flexion 85 7/17:  shoulder flexion to 90; 12/25/21: 105 (P 125); 03/12/22: active L shoulder flexion 110 (pt uses R arm to reach for items at shoulder height or above); 04/16/22: L shoulder flexion 107   Time 12    Period Weeks    Status On-going    Target Date 04/25/22     OT LONG TERM GOAL #6   Title Pt will improve FOTO score to 47 or above to demonstrate a clinically relevant change in function to impact ADL tasks.    Baseline score of 30 at eval; 08/17/21: FOTO: 42; 09/04/21: FOTO : 54 , FOTO 7/7:  60; 12/25/21: 59; 03/12/22: 66; 04/16/22: 67   Time 12    Period Weeks    Status  MET/ongoing    Target Date 04/25/22     OT LONG TERM GOAL #7   Title Pt will demonstrate ability to pick up small objects and complete 9 hole peg test in less than 1 min 30 sec (revised from under 2 min)   Baseline unable to perform at eval, 10th visit: still unable to pick up small pegs, 20th: unable to pick up pegs but improving; 08/17/21: not attempted d/t  time constraints, will assess next visit; 09/04/21: 9 hole peg test in 5 min 53 sec;  7/17:  Pt unable to complete this date but was able to place 6 of 9 pegs in 4 mins 20 secs; 12/25/21: L 1 min 43 sec; 03/12/22: L 1 min 2 sec (requires non-skid surface to pick up small items from table top); 04/16/22: 5 pegs in 2 min 36 secs   Time 12    Period Weeks    Status Revised/On-going    Target Date 04/25/22             Plan -     Clinical Impression Statement Pt reports she's still feeling weak but was cleared at the ER on Tues for any signs of a new stroke and was negative for flu and covid.  Pt reports she's reached out to PCP to discuss what might be the cause of her weakness.  Pt was wondering if it could be related to a recent medication change.  Downgraded hand gripper today from green band to red less resistant band for hand strengthening.  Pt was able to manage yellow, red, and green clips today on the L hand for lateral and 3 point pinching strengthening.  Pt practiced tying shoes and buttoning/unbuttoning large buttons today, engaging the L hand for at least 25% of the task, requiring extra time to complete.  Pt will continue to work towards goals in plan of care to improve left UE function, decrease pain and increase active normal movement patterns for daily tasks.    OT Occupational Profile and History Detailed Assessment- Review of Records and additional review of physical, cognitive, psychosocial history related to current functional  performance    Occupational performance deficits (Please refer to evaluation for details): ADL's;IADL's;Leisure;Rest and Sleep    Body Structure / Function / Physical Skills ADL;Coordination;Endurance;GMC;UE functional use;Balance;IADL;Pain;Dexterity;FMC;Strength;Edema;Mobility;ROM    Psychosocial Skills Environmental  Adaptations;Habits;Routines and Behaviors    Rehab Potential Good    Clinical Decision Making Several treatment options, min-mod task modification  necessary   Comorbidities Affecting Occupational Performance: May have comorbidities impacting occupational performance    Modification or Assistance to Complete Evaluation  Min-Moderate modification of tasks or assist with assess necessary to complete eval    OT Frequency 2x / week    OT Duration 12 weeks    OT Treatment/Interventions Self-care/ADL training;Cryotherapy;Paraffin;Therapeutic exercise;DME and/or AE instruction;Functional Mobility Training;Balance training;Electrical Stimulation;Ultrasound;Neuromuscular education;Manual Therapy;Splinting;Moist Heat;Contrast Bath;Passive range of motion;Therapeutic activities;Patient/family education;Coping strategies training    Plan OT recert    Consulted and Agree with Plan of Care Patient            Leta Speller, MS, OTR/L  Darleene Cleaver, OT 04/19/2022, 8:57 AM

## 2022-04-22 ENCOUNTER — Ambulatory Visit: Payer: Medicare PPO

## 2022-04-22 DIAGNOSIS — I639 Cerebral infarction, unspecified: Secondary | ICD-10-CM

## 2022-04-23 ENCOUNTER — Ambulatory Visit: Payer: Medicare PPO

## 2022-04-23 DIAGNOSIS — M6281 Muscle weakness (generalized): Secondary | ICD-10-CM

## 2022-04-23 DIAGNOSIS — R278 Other lack of coordination: Secondary | ICD-10-CM

## 2022-04-23 DIAGNOSIS — I63511 Cerebral infarction due to unspecified occlusion or stenosis of right middle cerebral artery: Secondary | ICD-10-CM

## 2022-04-24 LAB — CUP PACEART REMOTE DEVICE CHECK
Date Time Interrogation Session: 20240127230243
Implantable Pulse Generator Implant Date: 19440924

## 2022-04-24 NOTE — Therapy (Signed)
OUTPATIENT OCCUPATIONAL THERAPY NEURO TREATMENT NOTE          Patient Name: Teresa [name] MRN: [id number] DOB:1941/03/01, 82 y.o., female Today's Date: 12/13/2021  PCP: Dr. [name] REFERRING PROVIDER: Dr. [name]   OT End of Session - 04/24/22 1019     Visit Number [id number]    Number of Visits 58    Date for OT Re-Evaluation 04/25/22    Authorization Time Period Reporting period beginning 04/16/22    OT Start Time 1100    OT Stop Time 1145    OT Time Calculation (min) 45 min    Equipment Utilized During Treatment cane    Activity Tolerance Patient tolerated treatment well    Behavior During Therapy WFL for tasks assessed/performed             Past Medical History:  Diagnosis Date   Cancer ([place])    skin   Hypothyroidism    Raynaud's disease    Stroke [facility])    Past Surgical History:  Procedure Laterality Date   [place]  2010   Cervical fusion C4-5-6-7   CATARACT EXTRACTION, BILATERAL Bilateral 10/2016   CHOLECYSTECTOMY  1995   COLONOSCOPY WITH PROPOFOL N/A 12/08/2018   Procedure: COLONOSCOPY WITH PROPOFOL;  Surgeon: Toledo, Benay Pike, MD;  Location: [facility] ENDOSCOPY;  Service: Gastroenterology;  Laterality: N/A;   EYE SURGERY     Patient Active Problem List   Diagnosis Date Noted   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 to ER and then was transferred to [facility] in [place] where she was admitted and after acute care she went to inpatient rehab.  Following inpt rehab, pt went home and had home  health.   PRECAUTIONS: fall  SUBJECTIVE:  Pt reports she has a follow up with Dr. Doy Hutching tomorrow to discuss her ER visit last week related to the recent weakness pt has experienced.    PAIN:  Are you having pain? 0 OBJECTIVE:  L grip 6, R grip 41 L lateral pinch 5, R 13 L 3 point pinch 4, R 14  L 9 hole 1 min 43 sec. L shoulder active flexion 0-105, passive 0-125 02/05/22: L grip 13#  L lateral pinch 8#  3 point pinch 5#  L shoulder flex 0-108 03/12/22:  L grip: 12# L lateral pinch: 8# 3 point pinch: 6# L 9 hole: 1 min 3 sec L shoulder flexion 0-110 04/16/22: L grip: 3# L lateral pinch: 4# 3 point pinch: difficulty maintaining 3 point pinch  L 9 hole: 5 pegs in 2 min 36 sec  L shoulder flexion 0-107 BP L 132/79 HR 80 02 sats 96%  TODAY'S TREATMENT: Therapeutic Exercise: Performed passive stretching for L shoulder flex/abd/ER and forearm pron/sup, working to increase LUE ROM for ADLs.  Facilitated L grip strengthening with hand gripper set at min resistance with 1 red band to complete 3 sets 10 reps beginning of session, and 2 more sets end of session.  Attempted moderate resistance with green band but pt not able to sufficiently close  hand gripper.  Rest between sets and cues for technique to maximize hand closure with each squeeze.  Participated in 1# dowel exercise for bilat chest press, shoulder flex, abd, and ER to (dowel to forehead).  Pt completed 2 sets 10 reps each, requiring intermittent min A on the L side to maximize end range, and frequent verbal cues for extending L elbow with each rep during chest press, shoulder flex, and abd movements.  Intermittent vc needed to increase attention to L hand for sustaining strong grip around dowel.  Facilitated L forearm, wrist, and hand strengthening with participation in [place] board tools.  Pt worked with long handled tool to facilitate L wrist flex/ext, large base key turn, and large dial turn, for 1 rep for each tool (up/down  board=1 rep).  Rest breaks between sets and intermittent min A to maintain tools level on board.     PATIENT EDUCATION: Education details: LUE strengthening Person educated: pt Education method: Explanation and Verbal cues Education comprehension: verbalized understanding   HOME EXERCISE PROGRAM Continue to engage LUE into ADLs; continue use of putty for gripping and pinching exercises for LUE, and L shoulder AROM/AAROM, crocheting, typing; increase participation in IADL tasks for greater use of L hand.    OT Long Term Goals -       OT LONG TERM GOAL #1   Title Pt will be independent with home exercise program.    Baseline Eval: no current program, 10th visit:  continue to add new exercises as pt progresses, 20th:  continue to update HEP; 08/17/21: continue to progress HEP when indicated.  Visit 40: adding new exercises as pt progresses; 12/25/21: ongoing with progressions; 03/12/22: inconsistent use of putty but regular use of stress ball (every other day).  Encouraged pt return to daily putty use (2x daily when able) to target grip and pinch strengthening; 04/16/22: not reviewed today d/t OT sent pt to ED   Time 12    Period Weeks    Status On-going    Target Date 04/24/22     OT LONG TERM GOAL #2   Title Pt will complete UB and LB dressing with modified independence including buttons, snaps and zippers.    Baseline requires min assist at eval, 10th visit: occasional assist with buttons, 20th:  able to perform one handed, but difficulty with bilateral UE; 08/17/21: pt reports inconsistent with 1 hand, reviewed techniques this visit, Visit 40:  Pt requires assist with bra   Time 12    Period Weeks    Status MET   Target Date 11/08/21     OT LONG TERM GOAL #3   Title Pt will perform shower transfer with modified independence.    Baseline Pt requires supervision to min assist for shower transfer at home. 10th visit: supervision; 08/17/21: supv .  Visit 40:  Pt had met goal but had recent  fall and now requiring min guard to supv   Time 6    Period Weeks    Status  Met    Target Date 11/08/21      OT LONG TERM GOAL #4   Title Pt will improve L hand grip by 10# to assist with holding items in left hand securely.    Baseline no grip in left hand at eval, 10th visit:  improved flexion but still working towards composite fisting and grip. 20th:  continues to demo decreased grip; 08/17/21: active digit flexion improving, but not yet able to register grip on dynamometer; 09/04/21: L  grip 1# 7/17:  5#; 12/25/21: L grip 6#; 02/05/22: 13#; 03/12/22: L grip 12#; 1/23//24: L grip 3# (decreased)   Time 12    Period Weeks    Status On-going    Target Date 04/25/22     OT LONG TERM GOAL #5   Title Pt will improve left shoulder flexion to 100 degrees or better to improve reaching to obtain self care items from shelf/shoulder height.    Baseline difficulty with reach, shoulder flexion to 47 degrees; 08/17/21: L shoulder flexion 85, but not yet able to consistently hold ADL supplies in L hand when reaching; 09/04/21: flexion 85 7/17:  shoulder flexion to 90; 12/25/21: 105 (P 125); 03/12/22: active L shoulder flexion 110 (pt uses R arm to reach for items at shoulder height or above); 04/16/22: L shoulder flexion 107   Time 12    Period Weeks    Status On-going    Target Date 04/25/22     OT LONG TERM GOAL #6   Title Pt will improve FOTO score to 47 or above to demonstrate a clinically relevant change in function to impact ADL tasks.    Baseline score of 30 at eval; 08/17/21: FOTO: 42; 09/04/21: FOTO : 54 , FOTO 7/7:  60; 12/25/21: 59; 03/12/22: 66; 04/16/22: 67   Time 12    Period Weeks    Status  MET/ongoing    Target Date 04/25/22     OT LONG TERM GOAL #7   Title Pt will demonstrate ability to pick up small objects and complete 9 hole peg test in less than 1 min 30 sec (revised from under 2 min)   Baseline unable to perform at eval, 10th visit: still unable to pick up small pegs, 20th: unable to pick  up pegs but improving; 08/17/21: not attempted d/t time constraints, will assess next visit; 09/04/21: 9 hole peg test in 5 min 53 sec;  7/17:  Pt unable to complete this date but was able to place 6 of 9 pegs in 4 mins 20 secs; 12/25/21: L 1 min 43 sec; 03/12/22: L 1 min 2 sec (requires non-skid surface to pick up small items from table top); 04/16/22: 5 pegs in 2 min 36 secs   Time 12    Period Weeks    Status Revised/On-going    Target Date 04/25/22             Plan -     Clinical Impression Statement Pt reports she has a follow up with Dr. Doy Hutching tomorrow to discuss her ER visit last week related to the recent weakness pt has experienced.  Pt continues to present with increased weakness throughout the L side and pt verbalizes frustration with her recent set back.  Pt remains on low resistance (red band) for hand gripper.  Good tolerance to 1# dowel for BUE strengthening this date.  Pt required intermittent min A on the L side to maximize end range, and frequent verbal cues for extending L elbow with each rep during chest press, shoulder flex, and abd movements.  Intermittent vc needed to increase attention to L hand for sustaining strong grip around dowel.  Pt will continue to work towards goals in plan of care to improve left UE function, decrease pain and increase active normal movement patterns for daily tasks.    OT Occupational Profile and History Detailed Assessment- Review of Records and additional review of physical, cognitive, psychosocial history related to current functional performance    Occupational performance  deficits (Please refer to evaluation for details): ADL's;IADL's;Leisure;Rest and Sleep    Body Structure / Function / Physical Skills ADL;Coordination;Endurance;GMC;UE functional use;Balance;IADL;Pain;Dexterity;FMC;Strength;Edema;Mobility;ROM    Psychosocial Skills Environmental  Adaptations;Habits;Routines and Behaviors    Rehab Potential Good    Clinical Decision Making  Several treatment options, min-mod task modification necessary   Comorbidities Affecting Occupational Performance: May have comorbidities impacting occupational performance    Modification or Assistance to Complete Evaluation  Min-Moderate modification of tasks or assist with assess necessary to complete eval    OT Frequency 2x / week    OT Duration 12 weeks    OT Treatment/Interventions Self-care/ADL training;Cryotherapy;Paraffin;Therapeutic exercise;DME and/or AE instruction;Functional Mobility Training;Balance training;Electrical Stimulation;Ultrasound;Neuromuscular education;Manual Therapy;Splinting;Moist Heat;Contrast Bath;Passive range of motion;Therapeutic activities;Patient/family education;Coping strategies training    Plan OT recert    Consulted and Agree with Plan of Care Patient            Leta Speller, MS, OTR/L  Darleene Cleaver, OT 04/24/2022, 12:03 PM

## 2022-04-25 ENCOUNTER — Ambulatory Visit: Payer: Medicare PPO | Attending: Internal Medicine

## 2022-04-25 DIAGNOSIS — M6281 Muscle weakness (generalized): Secondary | ICD-10-CM | POA: Diagnosis present

## 2022-04-25 DIAGNOSIS — I63511 Cerebral infarction due to unspecified occlusion or stenosis of right middle cerebral artery: Secondary | ICD-10-CM | POA: Diagnosis present

## 2022-04-25 DIAGNOSIS — R2681 Unsteadiness on feet: Secondary | ICD-10-CM | POA: Diagnosis present

## 2022-04-25 DIAGNOSIS — R262 Difficulty in walking, not elsewhere classified: Secondary | ICD-10-CM | POA: Insufficient documentation

## 2022-04-25 DIAGNOSIS — R278 Other lack of coordination: Secondary | ICD-10-CM | POA: Diagnosis present

## 2022-04-25 NOTE — Therapy (Signed)
OUTPATIENT OCCUPATIONAL THERAPY NEURO RECERTIFICATION AND TREATMENT NOTE          Patient Name: Teresa [name] MRN: [id number] DOB:1940/07/30, 82 y.o., female Today's Date: 12/13/2021  PCP: Dr. [name] REFERRING PROVIDER: Dr. [name]   OT End of Session - 04/27/22 2030     Visit Number 93    Number of Visits 117    Date for OT Re-Evaluation 07/18/22    Authorization Time Period Reporting period beginning 04/16/22    OT Start Time 1148    OT Stop Time 1230    OT Time Calculation (min) 42 min    Equipment Utilized During Treatment cane    Activity Tolerance Patient tolerated treatment well    Behavior During Therapy WFL for tasks assessed/performed             Past Medical History:  Diagnosis Date   Cancer ([place])    skin   Hypothyroidism    Raynaud's disease    Stroke [facility])    Past Surgical History:  Procedure Laterality Date   [place]  2010   Cervical fusion C4-5-6-7   CATARACT EXTRACTION, BILATERAL Bilateral 10/2016   CHOLECYSTECTOMY  1995   COLONOSCOPY WITH PROPOFOL N/A 12/08/2018   Procedure: COLONOSCOPY WITH PROPOFOL;  Surgeon: Toledo, Benay Pike, MD;  Location: [facility] ENDOSCOPY;  Service: Gastroenterology;  Laterality: N/A;   EYE SURGERY     Patient Active Problem List   Diagnosis Date Noted   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  Following inpt rehab, pt  went home and had home health.   PRECAUTIONS: fall  SUBJECTIVE:  Pt reports she saw Dr. Doy Hutching for a follow up regarding her new onset of weakness.  Dr. Doy Hutching provided referral for neurology at [facility] for April 5th.     PAIN:  Are you having pain? 0 OBJECTIVE:  L grip 6, R grip 41 L lateral pinch 5, R 13 L 3 point pinch 4, R 14  L 9 hole 1 min 43 sec. L shoulder active flexion 0-105, passive 0-125 02/05/22: L grip 13#  L lateral pinch 8#  3 point pinch 5#  L shoulder flex 0-108 03/12/22:  L grip: 12# L lateral pinch: 8# 3 point pinch: 6# L 9 hole: 1 min 3 sec L shoulder flexion 0-110 04/16/22: L grip: 3# L lateral pinch: 4# 3 point pinch: difficulty maintaining 3 point pinch  L 9 hole: 5 pegs in 2 min 36 sec  L shoulder flexion 0-107 BP L 132/79 HR 80 02 sats 96% 04/25/22: L grip: 14#; R grip: 50# L lateral 2#, R lateral 17# 3 point pinch: L 4#, R 14# ([place] pinch gauge) 9 hole peg test: 1 min 48 sec L shoulder flex 125 (R shoulder 132 active)   TODAY'S TREATMENT: Therapeutic Exercise: Performed passive stretching for L shoulder flex/abd/ER and forearm pron/sup, working to increase LUE ROM for ADLs.  Facilitated L grip strengthening with hand gripper set at min resistance with 1 red band to complete 3 sets 10 reps beginning of session, and 2 more sets end of session.  Objective measures taken and goals updated for recertification.   PATIENT EDUCATION: Education details: LUE strengthening Person educated: pt Education method: Explanation and Verbal cues Education comprehension: verbalized understanding   HOME EXERCISE PROGRAM Continue to engage LUE into ADLs; continue use of putty for gripping and pinching exercises for LUE, and L shoulder AROM/AAROM, crocheting, typing; increase participation in IADL tasks for greater use of L hand.    OT Long Term Goals - 06/06/22 (6 weeks)      OT LONG TERM GOAL #1   Title Pt will be independent with home exercise program.     Baseline Eval: no current program, 10th visit:  continue to add new exercises as pt progresses, 20th:  continue to update HEP; 08/17/21: continue to progress HEP when indicated.  Visit 40: adding new exercises as pt progresses; 12/25/21: ongoing with progressions; 03/12/22: inconsistent use of putty but regular use of stress ball (every other day).  Encouraged pt return to daily putty use (2x daily when able) to target grip and pinch strengthening; 04/16/22: not reviewed today d/t OT sent pt to ED; 04/25/22: added pulleys and pt has re-started putty   Time 12    Period Weeks    Status On-going    Target Date 06/06/22     OT LONG TERM GOAL #2   Title Pt will complete UB and LB dressing with modified independence including buttons, snaps and zippers.    Baseline requires min assist at eval, 10th visit: occasional assist with buttons, 20th:  able to perform one handed, but difficulty with bilateral UE; 08/17/21: pt reports inconsistent with 1 hand, reviewed techniques this visit, Visit 40:  Pt requires assist with bra   Time 12    Period Weeks    Status MET   Target Date 11/08/21     OT LONG TERM GOAL #3   Title Pt will perform shower transfer with modified independence.    Baseline Pt requires supervision to min assist for shower transfer at home. 10th visit: supervision; 08/17/21: supv .  Visit 40:  Pt had met goal but had recent fall and now requiring min guard to supv   Time 6    Period Weeks    Status  Met    Target Date 11/08/21      OT LONG TERM GOAL #4   Title Pt will improve L hand grip to 20 or more #s to enable pt to open a new jar.   Baseline no grip in left hand at eval, 10th visit:  improved flexion but still working towards composite fisting and grip. 20th:  continues to demo decreased grip; 08/17/21: active digit flexion improving, but not yet able to register grip on dynamometer; 09/04/21: L grip 1# 7/17:  5#; 12/25/21: L grip 6#; 02/05/22: 13#; 03/12/22: L grip 12#; 1/23//24: L grip 3#  (decreased); 04/25/22: L grip 14#; spouse assists to open new jars   Time 12    Period Weeks    Status Revised   Target Date 07/18/22     OT LONG TERM GOAL #5   Title Pt will improve left shoulder flexion to 100 degrees or better to improve reaching to obtain self care items from shelf/shoulder height.    Baseline difficulty with reach, shoulder flexion to 47 degrees; 08/17/21: L shoulder flexion 85,  but not yet able to consistently hold ADL supplies in L hand when reaching; 09/04/21: flexion 85 7/17:  shoulder flexion to 90; 12/25/21: 105 (P 125); 03/12/22: active L shoulder flexion 110 (pt uses R arm to reach for items at shoulder height or above); 04/16/22: L shoulder flexion 107; 04/25/22: L shoulder flexion 125*, R 132*    Time 12    Period Weeks    Status achieved   Target Date 04/25/22     OT LONG TERM GOAL #6   Title Pt will improve FOTO score to 47 or above to demonstrate a clinically relevant change in function to impact ADL tasks.    Baseline score of 30 at eval; 08/17/21: FOTO: 42; 09/04/21: FOTO : 54 , FOTO 7/7:  60; 12/25/21: 59; 03/12/22: 66; 04/16/22: 67; 04/25/22; 51   Time 12    Period Weeks    Status  MET/ongoing    Target Date 04/25/22     OT LONG TERM GOAL #7   Title Pt will demonstrate ability to pick up small objects and complete 9 hole peg test in less than 1 min 30 sec (revised from under 2 min)   Baseline unable to perform at eval, 10th visit: still unable to pick up small pegs, 20th: unable to pick up pegs but improving; 08/17/21: not attempted d/t time constraints, will assess next visit; 09/04/21: 9 hole peg test in 5 min 53 sec;  7/17:  Pt unable to complete this date but was able to place 6 of 9 pegs in 4 mins 20 secs; 12/25/21: L 1 min 43 sec; 03/12/22: L 1 min 2 sec (requires non-skid surface to pick up small items from table top); 04/16/22: 5 pegs in 2 min 36 secs; 04/25/22: 1 min 48 sec   Time 12    Period Weeks    Status Revised/On-going    Target Date 07/18/22    OT LONG TERM  GOAL #8   Title Pt will increase LUE strength by 1 MM grade in order to hold blow dryer in LUE to dry hair without dropping dryer.   Baseline 04/25/22: Unable to sustain grip and maintain lift of LUE with hand near head without dropping dryer.  Dryer has caused bruising above L eye with previous attempts.  L shoulder flex/abd 3/5, elbow flex/ext 3+/5    Time 12    Period Weeks    Status New    Target Date 07/18/22        OT LONG TERM GOAL #9   Title Pt will increase L hand dexterity to enable pt to independently manage clothing fasteners.   Baseline 04/25/22: pt ties shoe laces loosely and requires assist from spouse occasionally.  When typing, pt engages the L hand for ~25% of the task.  Pt avoids wearing jeans d/t inability to manage button or zipper.     Time 12    Period Weeks    Status New    Target Date 07/18/22        OT LONG TERM GOAL #10   Title Pt will be able to independently carry a light plate of food or drink in L hand without spilling/dropping to increase efficiency with item transport in the kitchen.   Baseline 04/25/22: Pt requires constant cues to keep cup or plate level while multitasking in order to prevent spilling.   Time 12    Period Weeks    Status New    Target Date 07/18/22     Plan -  Clinical Impression Statement Pt was making steady gains with functional use of the LUE until a recent set back on 04/16/22 when pt presented to therapy session with increased weakness on the L side.  Imaging was negative for any new stroke.  Pt has since followed up with PCP and was given a referral for neurology follow up at [facility] on 06/28/22.  Since then, pt's L grip strength has continued to improve, but pt still feels weak overall and [facility] skills in the L hand have regressed.  Pt will continue to work towards goals in plan of care to improve left UE function, decrease pain and increase active normal movement patterns for daily tasks.    OT Occupational Profile and History Detailed  Assessment- Review of Records and additional review of physical, cognitive, psychosocial history related to current functional performance    Occupational performance deficits (Please refer to evaluation for details): ADL's;IADL's;Leisure;Rest and Sleep    Body Structure / Function / Physical Skills ADL;Coordination;Endurance;GMC;UE functional use;Balance;IADL;Pain;Dexterity;FMC;Strength;Edema;Mobility;ROM    Psychosocial Skills Environmental  Adaptations;Habits;Routines and Behaviors    Rehab Potential Good    Clinical Decision Making Several treatment options, min-mod task modification necessary   Comorbidities Affecting Occupational Performance: May have comorbidities impacting occupational performance    Modification or Assistance to Complete Evaluation  Min-Moderate modification of tasks or assist with assess necessary to complete eval    OT Frequency 2x / week    OT Duration 12 weeks    OT Treatment/Interventions Self-care/ADL training;Cryotherapy;Paraffin;Therapeutic exercise;DME and/or AE instruction;Functional Mobility Training;Balance training;Electrical Stimulation;Ultrasound;Neuromuscular education;Manual Therapy;Splinting;Moist Heat;Contrast Bath;Passive range of motion;Therapeutic activities;Patient/family education;Coping strategies training    Plan OT recert    Consulted and Agree with Plan of Care Patient            Leta Speller, MS, OTR/L  Darleene Cleaver, OT 04/27/2022, 8:33 PM

## 2022-04-26 ENCOUNTER — Ambulatory Visit: Payer: Medicare PPO

## 2022-04-30 ENCOUNTER — Ambulatory Visit: Payer: Medicare PPO

## 2022-04-30 DIAGNOSIS — R278 Other lack of coordination: Secondary | ICD-10-CM

## 2022-04-30 DIAGNOSIS — I63511 Cerebral infarction due to unspecified occlusion or stenosis of right middle cerebral artery: Secondary | ICD-10-CM

## 2022-04-30 DIAGNOSIS — M6281 Muscle weakness (generalized): Secondary | ICD-10-CM

## 2022-04-30 NOTE — Therapy (Signed)
OUTPATIENT OCCUPATIONAL THERAPY NEURO TREATMENT NOTE          Patient Name: Teresa [name] MRN: [id number] DOB:Mar 02, 1941, 82 y.o., female Today's Date: 12/13/2021  PCP: Dr. [name] REFERRING PROVIDER: Dr. [name]   OT End of Session - 04/30/22 1358     Visit Number 94    Number of Visits 117    Date for OT Re-Evaluation 07/18/22    Authorization Time Period Reporting period beginning 04/16/22    OT Start Time 1300    OT Stop Time 1350    OT Time Calculation (min) 50 min    Equipment Utilized During Treatment cane    Activity Tolerance Patient tolerated treatment well    Behavior During Therapy WFL for tasks assessed/performed             Past Medical History:  Diagnosis Date   Cancer ([place])    skin   Hypothyroidism    Raynaud's disease    Stroke [facility])    Past Surgical History:  Procedure Laterality Date   [place]   BACK SURGERY  2010   Cervical fusion C4-5-6-7   CATARACT EXTRACTION, BILATERAL Bilateral 10/2016   CHOLECYSTECTOMY  1995   COLONOSCOPY WITH PROPOFOL N/A 12/08/2018   Procedure: COLONOSCOPY WITH PROPOFOL;  Surgeon: Toledo, Benay Pike, MD;  Location: [facility] ENDOSCOPY;  Service: Gastroenterology;  Laterality: N/A;   EYE SURGERY     Patient Active Problem List   Diagnosis Date Noted   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  Following inpt rehab, pt went home and had  home health.   PRECAUTIONS: fall  SUBJECTIVE:  Pt reports she had a nice quiet weekend and has started to work on some of her new goals at home that were established or changed at recert (ie tying shoes, using blow dryer, buttoning pants).  PAIN:  Are you having pain? 0 OBJECTIVE:  L grip 6, R grip 41 L lateral pinch 5, R 13 L 3 point pinch 4, R 14  L 9 hole 1 min 43 sec. L shoulder active flexion 0-105, passive 0-125 02/05/22: L grip 13#  L lateral pinch 8#  3 point pinch 5#  L shoulder flex 0-108 03/12/22:  L grip: 12# L lateral pinch: 8# 3 point pinch: 6# L 9 hole: 1 min 3 sec L shoulder flexion 0-110 04/16/22: L grip: 3# L lateral pinch: 4# 3 point pinch: difficulty maintaining 3 point pinch  L 9 hole: 5 pegs in 2 min 36 sec  L shoulder flexion 0-107 BP L 132/79 HR 80 02 sats 96% 04/25/22: L grip: 14#; R grip: 50# L lateral 2#, R lateral 17# 3 point pinch: L 4#, R 14# (Saehan pinch gauge) 9 hole peg test: 1 min 48 sec L shoulder flex 125 (R shoulder 132 active)   TODAY'S TREATMENT: Therapeutic Exercise: Performed passive stretching for L scapular depression with OT providing manual stretch pushing down at  the spine of the scapula.  Performed scapular add/abd with OT providing a manual scapular glide, working to increase shoulder alignment, reduce pain from stiff musculature, and lengthen muscles for more normalized movement patterns.  Practiced reaching to pinch clothespins onto a vertical dowel with OT providing visual demo, verbal cues, and tactile cues to depress L shoulder while also maintaining back against back rest of chair to prevent compensatory leaning from trunk and facilitate reach from the shoulder and elbow.  Pt used L hand to clip yellow, red, green, and blue pins using a lateral pinch then a 3 point pinch, 1 trial for each pinch type and color.  Pt required intermittent min A to formulate correct lateral pinch pattern.  Facilitated L grip strengthening  with hand gripper set at min resistance with 1 red band to complete 3 sets 10 reps beginning of session, and 2 more sets mid session.    Neuro re-ed: Facilitated FMC/dexterity skills in the L hand working to pick up 1-2 [place] stones from dish, store in hand, and move to neighboring dishes.  Then practiced scooping 4-5 into palm with initial visual demo and min vc for technique to reduce dropped stones before releasing them into a neighboring dish.  OT provided intermittent tactile cues for L shoulder depression while reaching for stones with the LUE.    PATIENT EDUCATION: Education details: scapular mobility  Person educated: pt Education method: Explanation and Verbal cues Education comprehension: verbalized understanding   HOME EXERCISE PROGRAM Continue to engage LUE into ADLs; continue use of putty for gripping and pinching exercises for LUE, and L shoulder AROM/AAROM, crocheting, typing; increase participation in IADL tasks for greater use of L hand.    OT Long Term Goals - 06/06/22 (6 weeks)      OT LONG TERM GOAL #1   Title Pt will be independent with home exercise program.    Baseline Eval: no current program, 10th visit:  continue to add new exercises as pt progresses, 20th:  continue to update HEP; 08/17/21: continue to progress HEP when indicated.  Visit 40: adding new exercises as pt progresses; 12/25/21: ongoing with progressions; 03/12/22: inconsistent use of putty but regular use of stress ball (every other day).  Encouraged pt return to daily putty use (2x daily when able) to target grip and pinch strengthening; 04/16/22: not reviewed today d/t OT sent pt to ED; 04/25/22: added pulleys and pt has re-started putty   Time 12    Period Weeks    Status On-going    Target Date 06/06/22     OT LONG TERM GOAL #2   Title Pt will complete UB and LB dressing with modified independence including buttons, snaps and zippers.    Baseline requires min assist at eval, 10th visit: occasional  assist with buttons, 20th:  able to perform one handed, but difficulty with bilateral UE; 08/17/21: pt reports inconsistent with 1 hand, reviewed techniques this visit, Visit 40:  Pt requires assist with bra   Time 12    Period Weeks    Status MET   Target Date 11/08/21     OT LONG TERM GOAL #3   Title Pt will perform shower transfer with modified independence.    Baseline Pt requires supervision to min assist for shower transfer at home. 10th visit: supervision; 08/17/21: supv .  Visit 40:  Pt had met goal but had recent fall and now requiring min guard to supv   Time 6    Period Weeks  Status  Met    Target Date 11/08/21      OT LONG TERM GOAL #4   Title Pt will improve L hand grip to 20 or more #s to enable pt to open a new jar.   Baseline no grip in left hand at eval, 10th visit:  improved flexion but still working towards composite fisting and grip. 20th:  continues to demo decreased grip; 08/17/21: active digit flexion improving, but not yet able to register grip on dynamometer; 09/04/21: L grip 1# 7/17:  5#; 12/25/21: L grip 6#; 02/05/22: 13#; 03/12/22: L grip 12#; 1/23//24: L grip 3# (decreased); 04/25/22: L grip 14#; spouse assists to open new jars   Time 12    Period Weeks    Status Revised   Target Date 07/18/22     OT LONG TERM GOAL #5   Title Pt will improve left shoulder flexion to 100 degrees or better to improve reaching to obtain self care items from shelf/shoulder height.    Baseline difficulty with reach, shoulder flexion to 47 degrees; 08/17/21: L shoulder flexion 85, but not yet able to consistently hold ADL supplies in L hand when reaching; 09/04/21: flexion 85 7/17:  shoulder flexion to 90; 12/25/21: 105 (P 125); 03/12/22: active L shoulder flexion 110 (pt uses R arm to reach for items at shoulder height or above); 04/16/22: L shoulder flexion 107; 04/25/22: L shoulder flexion 125*, R 132*    Time 12    Period Weeks    Status achieved   Target Date 04/25/22     OT LONG TERM  GOAL #6   Title Pt will improve FOTO score to 47 or above to demonstrate a clinically relevant change in function to impact ADL tasks.    Baseline score of 30 at eval; 08/17/21: FOTO: 42; 09/04/21: FOTO : 54 , FOTO 7/7:  60; 12/25/21: 59; 03/12/22: 66; 04/16/22: 67; 04/25/22; 51   Time 12    Period Weeks    Status  MET/ongoing    Target Date 04/25/22     OT LONG TERM GOAL #7   Title Pt will demonstrate ability to pick up small objects and complete 9 hole peg test in less than 1 min 30 sec (revised from under 2 min)   Baseline unable to perform at eval, 10th visit: still unable to pick up small pegs, 20th: unable to pick up pegs but improving; 08/17/21: not attempted d/t time constraints, will assess next visit; 09/04/21: 9 hole peg test in 5 min 53 sec;  7/17:  Pt unable to complete this date but was able to place 6 of 9 pegs in 4 mins 20 secs; 12/25/21: L 1 min 43 sec; 03/12/22: L 1 min 2 sec (requires non-skid surface to pick up small items from table top); 04/16/22: 5 pegs in 2 min 36 secs; 04/25/22: 1 min 48 sec   Time 12    Period Weeks    Status Revised/On-going    Target Date 07/18/22    OT LONG TERM GOAL #8   Title Pt will increase LUE strength by 1 MM grade in order to hold blow dryer in LUE to dry hair without dropping dryer.   Baseline 04/25/22: Unable to sustain grip and maintain lift of LUE with hand near head without dropping dryer.  Dryer has caused bruising above L eye with previous attempts.  L shoulder flex/abd 3/5, elbow flex/ext 3+/5    Time 12    Period Weeks    Status New  Target Date 07/18/22        OT LONG TERM GOAL #9   Title Pt will increase L hand dexterity to enable pt to independently manage clothing fasteners.   Baseline 04/25/22: pt ties shoe laces loosely and requires assist from spouse occasionally.  When typing, pt engages the L hand for ~25% of the task.  Pt avoids wearing jeans d/t inability to manage button or zipper.     Time 12    Period Weeks    Status New     Target Date 07/18/22        OT LONG TERM GOAL #10   Title Pt will be able to independently carry a light plate of food or drink in L hand without spilling/dropping to increase efficiency with item transport in the kitchen.   Baseline 04/25/22: Pt requires constant cues to keep cup or plate level while multitasking in order to prevent spilling.   Time 12    Period Weeks    Status New    Target Date 07/18/22     Plan -     Clinical Impression Statement Pt reports that she attempted to use her blow dryer in her L hand this weekend but dropped it 2 times.  OT reassured pt that she can work up to this activity (LTG).  Focussed on scapular mobility this date with passive stretching for L scapular depression with OT providing manual stretch pushing down at the spine of the scapula.  Provided tactile cues intermittently during functional reaching activities to reduce shoulder hiking.  By end of session, pt demonstrated better shoulder alignment, noted while ambulating out of the clinic, with less scapular elevation.  Intermittent use of heat on L hand (pt brings her hand warmers to therapy) continues to be effective for better ability to manage L hand strength and coordination activities, as Raynaud's symptoms tend to limit these skills as hand gets colder.  Pt will continue to work towards goals in plan of care to improve left UE function, decrease pain and increase active normal movement patterns for daily tasks.    OT Occupational Profile and History Detailed Assessment- Review of Records and additional review of physical, cognitive, psychosocial history related to current functional performance    Occupational performance deficits (Please refer to evaluation for details): ADL's;IADL's;Leisure;Rest and Sleep    Body Structure / Function / Physical Skills ADL;Coordination;Endurance;GMC;UE functional use;Balance;IADL;Pain;Dexterity;FMC;Strength;Edema;Mobility;ROM    Psychosocial Skills Environmental   Adaptations;Habits;Routines and Behaviors    Rehab Potential Good    Clinical Decision Making Several treatment options, min-mod task modification necessary   Comorbidities Affecting Occupational Performance: May have comorbidities impacting occupational performance    Modification or Assistance to Complete Evaluation  Min-Moderate modification of tasks or assist with assess necessary to complete eval    OT Frequency 2x / week    OT Duration 12 weeks    OT Treatment/Interventions Self-care/ADL training;Cryotherapy;Paraffin;Therapeutic exercise;DME and/or AE instruction;Functional Mobility Training;Balance training;Electrical Stimulation;Ultrasound;Neuromuscular education;Manual Therapy;Splinting;Moist Heat;Contrast Bath;Passive range of motion;Therapeutic activities;Patient/family education;Coping strategies training    Plan OT recert    Consulted and Agree with Plan of Care Patient            Leta Speller, MS, OTR/L  Darleene Cleaver, OT 04/30/2022, 1:59 PM

## 2022-05-02 ENCOUNTER — Ambulatory Visit: Payer: Medicare PPO

## 2022-05-02 DIAGNOSIS — R278 Other lack of coordination: Secondary | ICD-10-CM

## 2022-05-02 DIAGNOSIS — I63511 Cerebral infarction due to unspecified occlusion or stenosis of right middle cerebral artery: Secondary | ICD-10-CM

## 2022-05-02 DIAGNOSIS — M6281 Muscle weakness (generalized): Secondary | ICD-10-CM | POA: Diagnosis not present

## 2022-05-05 NOTE — Therapy (Signed)
OUTPATIENT OCCUPATIONAL THERAPY NEURO TREATMENT NOTE          Patient Name: Teresa [name] MRN: [id number] DOB:1940/06/09, 82 y.o., female Today's Date: 12/13/2021  PCP: Dr. [name] REFERRING PROVIDER: Dr. [name]   OT End of Session - 05/05/22 1422     Visit Number 95    Number of Visits 117    Date for OT Re-Evaluation 07/18/22    Authorization Time Period Reporting period beginning 04/16/22    OT Start Time 1145    OT Stop Time 1230    OT Time Calculation (min) 45 min    Equipment Utilized During Treatment cane    Activity Tolerance Patient tolerated treatment well    Behavior During Therapy WFL for tasks assessed/performed             Past Medical History:  Diagnosis Date   Cancer ([place])    skin   Hypothyroidism    Raynaud's disease    Stroke [facility])    Past Surgical History:  Procedure Laterality Date   [place]  2010   Cervical fusion C4-5-6-7   CATARACT EXTRACTION, BILATERAL Bilateral 10/2016   CHOLECYSTECTOMY  1995   COLONOSCOPY WITH PROPOFOL N/A 12/08/2018   Procedure: COLONOSCOPY WITH PROPOFOL;  Surgeon: Toledo, Benay Pike, MD;  Location: [facility] ENDOSCOPY;  Service: Gastroenterology;  Laterality: N/A;   EYE SURGERY     Patient Active Problem List   Diagnosis Date Noted   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   Stroke ([place]) 02/27/2021   History of nonmelanoma skin cancer 05/23/2014   ONSET DATE: 02/26/2021  REFERRING DIAG: R MCA CVA  THERAPY DIAG:  Muscle weakness (generalized)  Other lack of coordination  Right middle cerebral artery stroke [facility])  Rationale for Evaluation and Treatment Rehabilitation  PERTINENT HISTORY: February 26, 2021, pt reports she had a CVA, came to [facility], [facility], [facility] to ER and then was transferred to [facility] in [place] where she was admitted and after acute care she went to inpatient rehab.  Following inpt rehab, pt went home and had  home health.   PRECAUTIONS: fall  SUBJECTIVE: Pt reports doing well this date.      PAIN:  Are you having pain? 0 OBJECTIVE:  L grip 6, R grip 41 L lateral pinch 5, R 13 L 3 point pinch 4, R 14  L 9 hole 1 min 43 sec. L shoulder active flexion 0-105, passive 0-125 02/05/22: L grip 13#  L lateral pinch 8#  3 point pinch 5#  L shoulder flex 0-108 03/12/22:  L grip: 12# L lateral pinch: 8# 3 point pinch: 6# L 9 hole: 1 min 3 sec L shoulder flexion 0-110 04/16/22: L grip: 3# L lateral pinch: 4# 3 point pinch: difficulty maintaining 3 point pinch  L 9 hole: 5 pegs in 2 min 36 sec  L shoulder flexion 0-107 BP L 132/79 HR 80 02 sats 96% 04/25/22: L grip: 14#; R grip: 50# L lateral 2#, R lateral 17# 3 point pinch: L 4#, R 14# (Saehan pinch gauge) 9 hole peg test: 1 min 48 sec L shoulder flex 125 (R shoulder 132 active)   TODAY'S TREATMENT: Therapeutic Exercise: Performed passive stretching for L scapular depression with OT providing manual stretch pushing down at the spine of the scapula.  Performed scapular add/abd with OT providing a manual scapular glide, reduce pain from stiff musculature, and lengthen muscles for more normalized  movement patterns.  Practiced reaching to pinch clothespins onto a vertical dowel with OT providing visual demo, verbal cues, and tactile cues to depress L shoulder while also maintaining back against back rest of chair to prevent compensatory leaning from trunk and facilitate reach from the shoulder and elbow.  Pt used L hand to clip yellow, red, green, and blue pins using a lateral pinch then a 3 point pinch, 1 trial for each pinch type and color.  Pt required intermittent min A to formulate correct lateral pinch pattern.  Facilitated L grip strengthening with hand gripper set at min resistance with 1 red band to complete 3 sets 10 reps beginning of session, and 2 more sets mid session.    Neuro re-ed:  Promoted forward and diagonal reaching  patterns with visual demo and verbal cues to keep back against back rest of chair to isolate shoulder from the trunk  while placing Saebo rings over vertical dowel.   PATIENT EDUCATION: Education details: normalized reaching patterns Person educated: pt Education method: Explanation and Verbal cues Education comprehension: verbalized understanding   HOME EXERCISE PROGRAM Continue to engage LUE into ADLs; continue use of putty for gripping and pinching exercises for LUE, and L shoulder AROM/AAROM, crocheting, typing; increase participation in IADL tasks for greater use of L hand.    OT Long Term Goals - 06/06/22 (6 weeks)      OT LONG TERM GOAL #1   Title Pt will be independent with home exercise program.    Baseline Eval: no current program, 10th visit:  continue to add new exercises as pt progresses, 20th:  continue to update HEP; 08/17/21: continue to progress HEP when indicated.  Visit 40: adding new exercises as pt progresses; 12/25/21: ongoing with progressions; 03/12/22: inconsistent use of putty but regular use of stress ball (every other day).  Encouraged pt return to daily putty use (2x daily when able) to target grip and pinch strengthening; 04/16/22: not reviewed today d/t OT sent pt to ED; 04/25/22: added pulleys and pt has re-started putty   Time 12    Period Weeks    Status On-going    Target Date 06/06/22     OT LONG TERM GOAL #2   Title Pt will complete UB and LB dressing with modified independence including buttons, snaps and zippers.    Baseline requires min assist at eval, 10th visit: occasional assist with buttons, 20th:  able to perform one handed, but difficulty with bilateral UE; 08/17/21: pt reports inconsistent with 1 hand, reviewed techniques this visit, Visit 40:  Pt requires assist with bra   Time 12    Period Weeks    Status MET   Target Date 11/08/21     OT LONG TERM GOAL #3   Title Pt will perform shower transfer with modified independence.    Baseline Pt  requires supervision to min assist for shower transfer at home. 10th visit: supervision; 08/17/21: supv .  Visit 40:  Pt had met goal but had recent fall and now requiring min guard to supv   Time 6    Period Weeks    Status  Met    Target Date 11/08/21      OT LONG TERM GOAL #4   Title Pt will improve L hand grip to 20 or more #s to enable pt to open a new jar.   Baseline no grip in left hand at eval, 10th visit:  improved flexion but still working towards composite fisting and grip.  20th:  continues to demo decreased grip; 08/17/21: active digit flexion improving, but not yet able to register grip on dynamometer; 09/04/21: L grip 1# 7/17:  5#; 12/25/21: L grip 6#; 02/05/22: 13#; 03/12/22: L grip 12#; 1/23//24: L grip 3# (decreased); 04/25/22: L grip 14#; spouse assists to open new jars   Time 12    Period Weeks    Status Revised   Target Date 07/18/22     OT LONG TERM GOAL #5   Title Pt will improve left shoulder flexion to 100 degrees or better to improve reaching to obtain self care items from shelf/shoulder height.    Baseline difficulty with reach, shoulder flexion to 47 degrees; 08/17/21: L shoulder flexion 85, but not yet able to consistently hold ADL supplies in L hand when reaching; 09/04/21: flexion 85 7/17:  shoulder flexion to 90; 12/25/21: 105 (P 125); 03/12/22: active L shoulder flexion 110 (pt uses R arm to reach for items at shoulder height or above); 04/16/22: L shoulder flexion 107; 04/25/22: L shoulder flexion 125*, R 132*    Time 12    Period Weeks    Status achieved   Target Date 04/25/22     OT LONG TERM GOAL #6   Title Pt will improve FOTO score to 47 or above to demonstrate a clinically relevant change in function to impact ADL tasks.    Baseline score of 30 at eval; 08/17/21: FOTO: 42; 09/04/21: FOTO : 54 , FOTO 7/7:  60; 12/25/21: 59; 03/12/22: 66; 04/16/22: 67; 04/25/22; 51   Time 12    Period Weeks    Status  MET/ongoing    Target Date 04/25/22     OT LONG TERM GOAL #7   Title Pt  will demonstrate ability to pick up small objects and complete 9 hole peg test in less than 1 min 30 sec (revised from under 2 min)   Baseline unable to perform at eval, 10th visit: still unable to pick up small pegs, 20th: unable to pick up pegs but improving; 08/17/21: not attempted d/t time constraints, will assess next visit; 09/04/21: 9 hole peg test in 5 min 53 sec;  7/17:  Pt unable to complete this date but was able to place 6 of 9 pegs in 4 mins 20 secs; 12/25/21: L 1 min 43 sec; 03/12/22: L 1 min 2 sec (requires non-skid surface to pick up small items from table top); 04/16/22: 5 pegs in 2 min 36 secs; 04/25/22: 1 min 48 sec   Time 12    Period Weeks    Status Revised/On-going    Target Date 07/18/22    OT LONG TERM GOAL #8   Title Pt will increase LUE strength by 1 MM grade in order to hold blow dryer in LUE to dry hair without dropping dryer.   Baseline 04/25/22: Unable to sustain grip and maintain lift of LUE with hand near head without dropping dryer.  Dryer has caused bruising above L eye with previous attempts.  L shoulder flex/abd 3/5, elbow flex/ext 3+/5    Time 12    Period Weeks    Status New    Target Date 07/18/22        OT LONG TERM GOAL #9   Title Pt will increase L hand dexterity to enable pt to independently manage clothing fasteners.   Baseline 04/25/22: pt ties shoe laces loosely and requires assist from spouse occasionally.  When typing, pt engages the L hand for ~25% of the task.  Pt avoids wearing jeans d/t inability to manage button or zipper.     Time 12    Period Weeks    Status New    Target Date 07/18/22        OT LONG TERM GOAL #10   Title Pt will be able to independently carry a light plate of food or drink in L hand without spilling/dropping to increase efficiency with item transport in the kitchen.   Baseline 04/25/22: Pt requires constant cues to keep cup or plate level while multitasking in order to prevent spilling.   Time 12    Period Weeks    Status New     Target Date 07/18/22     Plan -     Clinical Impression Statement Pt reports spouse had stretched the muscles around her L scapula as pt was instructed last session.  Continued focus on scapular mobility this date with passive stretching for L scapular depression with OT providing manual stretch pushing down at the spine of the scapula.  Reduced L shoulder hiking noted during mobility and LUE activity.  Focused on normalized reaching patterns using the LUE to reach into forward and diagonal planes.  Pt does well using back rest of chair as a reminder to prevent leaning forward to better isolate the shoulder from the trunk while reaching.  Pt will continue to work towards goals in plan of care to improve left UE function, decrease pain and increase active normal movement patterns for daily tasks.    OT Occupational Profile and History Detailed Assessment- Review of Records and additional review of physical, cognitive, psychosocial history related to current functional performance    Occupational performance deficits (Please refer to evaluation for details): ADL's;IADL's;Leisure;Rest and Sleep    Body Structure / Function / Physical Skills ADL;Coordination;Endurance;GMC;UE functional use;Balance;IADL;Pain;Dexterity;FMC;Strength;Edema;Mobility;ROM    Psychosocial Skills Environmental  Adaptations;Habits;Routines and Behaviors    Rehab Potential Good    Clinical Decision Making Several treatment options, min-mod task modification necessary   Comorbidities Affecting Occupational Performance: May have comorbidities impacting occupational performance    Modification or Assistance to Complete Evaluation  Min-Moderate modification of tasks or assist with assess necessary to complete eval    OT Frequency 2x / week    OT Duration 12 weeks    OT Treatment/Interventions Self-care/ADL training;Cryotherapy;Paraffin;Therapeutic exercise;DME and/or AE instruction;Functional Mobility Training;Balance  training;Electrical Stimulation;Ultrasound;Neuromuscular education;Manual Therapy;Splinting;Moist Heat;Contrast Bath;Passive range of motion;Therapeutic activities;Patient/family education;Coping strategies training    Plan OT recert    Consulted and Agree with Plan of Care Patient            Leta Speller, MS, OTR/L  Darleene Cleaver, OT 05/05/2022, 2:26 PM

## 2022-05-07 ENCOUNTER — Ambulatory Visit: Payer: Medicare PPO

## 2022-05-07 DIAGNOSIS — R278 Other lack of coordination: Secondary | ICD-10-CM

## 2022-05-07 DIAGNOSIS — I63511 Cerebral infarction due to unspecified occlusion or stenosis of right middle cerebral artery: Secondary | ICD-10-CM

## 2022-05-07 DIAGNOSIS — M6281 Muscle weakness (generalized): Secondary | ICD-10-CM

## 2022-05-07 NOTE — Therapy (Signed)
OUTPATIENT OCCUPATIONAL THERAPY NEURO TREATMENT NOTE          Patient Name: Teresa [name] MRN: [id number] DOB:Aug 03, 1940, 82 y.o., female Today's Date: 12/13/2021  PCP: Dr. [name] REFERRING PROVIDER: Dr. [name]   OT End of Session - 05/07/22 1423     Visit Number 96    Number of Visits 117    Date for OT Re-Evaluation 07/18/22    Authorization Time Period Reporting period beginning 04/16/22    OT Start Time 1100    OT Stop Time 1145    OT Time Calculation (min) 45 min    Equipment Utilized During Treatment cane    Activity Tolerance Patient tolerated treatment well    Behavior During Therapy WFL for tasks assessed/performed             Past Medical History:  Diagnosis Date   Cancer ([place])    skin   Hypothyroidism    Raynaud's disease    Stroke [facility])    Past Surgical History:  Procedure Laterality Date   [place]  2010   Cervical fusion C4-5-6-7   CATARACT EXTRACTION, BILATERAL Bilateral 10/2016   CHOLECYSTECTOMY  1995   COLONOSCOPY WITH PROPOFOL N/A 12/08/2018   Procedure: COLONOSCOPY WITH PROPOFOL;  Surgeon: Toledo, Benay Pike, MD;  Location: [facility] ENDOSCOPY;  Service: Gastroenterology;  Laterality: N/A;   EYE SURGERY     Patient Active Problem List   Diagnosis Date Noted   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  Following inpt rehab, pt went home and had  home health.   PRECAUTIONS: fall  SUBJECTIVE: Pt reports low back and neck are sore today.  Pt also reports that Dr. Doy Hutching gave her another referral to restart PT as he feels like L foot is dragging more than it should.  Pt is scheduled end of month for PT re-eval.   PAIN:  Are you having pain? 2 (neck and low back); improves with moist heat OBJECTIVE:  L grip 6, R grip 41 L lateral pinch 5, R 13 L 3 point pinch 4, R 14  L 9 hole 1 min 43 sec. L shoulder active flexion 0-105, passive 0-125 02/05/22: L grip 13#  L lateral pinch 8#  3 point pinch 5#  L shoulder flex 0-108 03/12/22:  L grip: 12# L lateral pinch: 8# 3 point pinch: 6# L 9 hole: 1 min 3 sec L shoulder flexion 0-110 04/16/22: L grip: 3# L lateral pinch: 4# 3 point pinch: difficulty maintaining 3 point pinch  L 9 hole: 5 pegs in 2 min 36 sec  L shoulder flexion 0-107 BP L 132/79 HR 80 02 sats 96% 04/25/22: L grip: 14#; R grip: 50# L lateral 2#, R lateral 17# 3 point pinch: L 4#, R 14# (Saehan pinch gauge) 9 hole peg test: 1 min 48 sec L shoulder flex 125 (R shoulder 132 active)   TODAY'S TREATMENT:  Therapeutic Exercise: Facilitated hand strengthening with use of hand gripper set at 6.6# to remove jumbo pegs from pegboard x2 trials using L hand.  Pt required frequent rest breaks in order to successfully complete each set, and cues to maintain SF around hand gripper to engage this finger into each grip.  Pt practiced reaching for pegs to remove from container and place into pegboard keeping back up against backrest of chair in order to promote shoulder isolation with elbow extension while reaching.  With forearm resting on incline wedge, pt used 1# dumbbell to complete L wrist flex/ext/RD for 3 sets 10 reps each.  Pt required mod A to initiate each wrist position and tactile cues to isolate forearm.     Neuro re-ed: Pt picked up 1/2"-1" washers from dish and placed over vertical dowels to target small item pick up  and reaching toward a target.  Pt again practiced reaching while keeping back up against back rest of chair to reduce compensatory leaning during a forward or lateral reach.  PATIENT EDUCATION: Education details: normalized reaching patterns Person educated: pt Education method: Explanation and Verbal cues Education comprehension: verbalized understanding   HOME EXERCISE PROGRAM Continue to engage LUE into ADLs; continue use of putty for gripping and pinching exercises for LUE, and L shoulder AROM/AAROM, crocheting, typing; increase participation in IADL tasks for greater use of L hand.    OT Long Term Goals - 06/06/22 (6 weeks)      OT LONG TERM GOAL #1   Title Pt will be independent with home exercise program.    Baseline Eval: no current program, 10th visit:  continue to add new exercises as pt progresses, 20th:  continue to update HEP; 08/17/21: continue to progress HEP when indicated.  Visit 40: adding new exercises as pt progresses; 12/25/21: ongoing with progressions; 03/12/22: inconsistent use of putty but regular use of stress ball (every other day).  Encouraged pt return to daily putty use (2x daily when able) to target grip and pinch strengthening; 04/16/22: not reviewed today d/t OT sent pt to ED; 04/25/22: added pulleys and pt has re-started putty   Time 12    Period Weeks    Status On-going    Target Date 06/06/22     OT LONG TERM GOAL #2   Title Pt will complete UB and LB dressing with modified independence including buttons, snaps and zippers.    Baseline requires min assist at eval, 10th visit: occasional assist with buttons, 20th:  able to perform one handed, but difficulty with bilateral UE; 08/17/21: pt reports inconsistent with 1 hand, reviewed techniques this visit, Visit 40:  Pt requires assist with bra   Time 12    Period Weeks    Status MET   Target Date 11/08/21     OT LONG TERM GOAL #3   Title Pt will perform shower transfer with modified independence.    Baseline  Pt requires supervision to min assist for shower transfer at home. 10th visit: supervision; 08/17/21: supv .  Visit 40:  Pt had met goal but had recent fall and now requiring min guard to supv   Time 6    Period Weeks    Status  Met    Target Date 11/08/21      OT LONG TERM GOAL #4   Title Pt will improve L hand grip to 20 or more #s to enable pt to open a new jar.   Baseline no grip in left hand at eval, 10th  visit:  improved flexion but still working towards composite fisting and grip. 20th:  continues to demo decreased grip; 08/17/21: active digit flexion improving, but not yet able to register grip on dynamometer; 09/04/21: L grip 1# 7/17:  5#; 12/25/21: L grip 6#; 02/05/22: 13#; 03/12/22: L grip 12#; 1/23//24: L grip 3# (decreased); 04/25/22: L grip 14#; spouse assists to open new jars   Time 12    Period Weeks    Status Revised   Target Date 07/18/22     OT LONG TERM GOAL #5   Title Pt will improve left shoulder flexion to 100 degrees or better to improve reaching to obtain self care items from shelf/shoulder height.    Baseline difficulty with reach, shoulder flexion to 47 degrees; 08/17/21: L shoulder flexion 85, but not yet able to consistently hold ADL supplies in L hand when reaching; 09/04/21: flexion 85 7/17:  shoulder flexion to 90; 12/25/21: 105 (P 125); 03/12/22: active L shoulder flexion 110 (pt uses R arm to reach for items at shoulder height or above); 04/16/22: L shoulder flexion 107; 04/25/22: L shoulder flexion 125*, R 132*    Time 12    Period Weeks    Status achieved   Target Date 04/25/22     OT LONG TERM GOAL #6   Title Pt will improve FOTO score to 47 or above to demonstrate a clinically relevant change in function to impact ADL tasks.    Baseline score of 30 at eval; 08/17/21: FOTO: 42; 09/04/21: FOTO : 54 , FOTO 7/7:  60; 12/25/21: 59; 03/12/22: 66; 04/16/22: 67; 04/25/22; 51   Time 12    Period Weeks    Status  MET/ongoing    Target Date 04/25/22     OT LONG TERM GOAL #7   Title  Pt will demonstrate ability to pick up small objects and complete 9 hole peg test in less than 1 min 30 sec (revised from under 2 min)   Baseline unable to perform at eval, 10th visit: still unable to pick up small pegs, 20th: unable to pick up pegs but improving; 08/17/21: not attempted d/t time constraints, will assess next visit; 09/04/21: 9 hole peg test in 5 min 53 sec;  7/17:  Pt unable to complete this date but was able to place 6 of 9 pegs in 4 mins 20 secs; 12/25/21: L 1 min 43 sec; 03/12/22: L 1 min 2 sec (requires non-skid surface to pick up small items from table top); 04/16/22: 5 pegs in 2 min 36 secs; 04/25/22: 1 min 48 sec   Time 12    Period Weeks    Status Revised/On-going    Target Date 07/18/22    OT LONG TERM GOAL #8   Title Pt will increase LUE strength by 1 MM grade in order to hold blow dryer in LUE to dry hair without dropping dryer.   Baseline 04/25/22: Unable to sustain grip and maintain lift of LUE with hand near head without dropping dryer.  Dryer has caused bruising above L eye with previous attempts.  L shoulder flex/abd 3/5, elbow flex/ext 3+/5    Time 12    Period Weeks    Status New    Target Date 07/18/22        OT LONG TERM GOAL #9   Title Pt will increase L hand dexterity to enable pt to independently manage clothing fasteners.   Baseline 04/25/22: pt ties shoe laces loosely and requires assist from spouse occasionally.  When  typing, pt engages the L hand for ~25% of the task.  Pt avoids wearing jeans d/t inability to manage button or zipper.     Time 12    Period Weeks    Status New    Target Date 07/18/22        OT LONG TERM GOAL #10   Title Pt will be able to independently carry a light plate of food or drink in L hand without spilling/dropping to increase efficiency with item transport in the kitchen.   Baseline 04/25/22: Pt requires constant cues to keep cup or plate level while multitasking in order to prevent spilling.   Time 12    Period Weeks    Status  New    Target Date 07/18/22     Plan -     Clinical Impression Statement Continued focus on normalized reaching patterns using the LUE to reach into forward and diagonal planes.  Pt does well using back rest of chair as a reminder to prevent leaning forward to better isolate the shoulder from the trunk while reaching.  Pt requires extra time to remove washers from magnetic dish, and can pick up 2 at a time but drops the 2nd washer ~50% of the time when attempting to place both over a vertical dowel.  Pt continues to require cues to maintain L SF around hand gripper to reduce dropped pegs d/t decreased proprioception with the distal LUE.  Pt requires frequent rest breaks to remove jumbo pegs with hand gripper on first setting (6.6#) d/t L hand weakness.  Pt will continue to work towards goals in plan of care to improve left UE function, decrease pain and increase active normal movement patterns for daily tasks.    OT Occupational Profile and History Detailed Assessment- Review of Records and additional review of physical, cognitive, psychosocial history related to current functional performance    Occupational performance deficits (Please refer to evaluation for details): ADL's;IADL's;Leisure;Rest and Sleep    Body Structure / Function / Physical Skills ADL;Coordination;Endurance;GMC;UE functional use;Balance;IADL;Pain;Dexterity;FMC;Strength;Edema;Mobility;ROM    Psychosocial Skills Environmental  Adaptations;Habits;Routines and Behaviors    Rehab Potential Good    Clinical Decision Making Several treatment options, min-mod task modification necessary   Comorbidities Affecting Occupational Performance: May have comorbidities impacting occupational performance    Modification or Assistance to Complete Evaluation  Min-Moderate modification of tasks or assist with assess necessary to complete eval    OT Frequency 2x / week    OT Duration 12 weeks    OT Treatment/Interventions Self-care/ADL  training;Cryotherapy;Paraffin;Therapeutic exercise;DME and/or AE instruction;Functional Mobility Training;Balance training;Electrical Stimulation;Ultrasound;Neuromuscular education;Manual Therapy;Splinting;Moist Heat;Contrast Bath;Passive range of motion;Therapeutic activities;Patient/family education;Coping strategies training    Plan OT recert    Consulted and Agree with Plan of Care Patient            Leta Speller, MS, OTR/L  Darleene Cleaver, OT 05/07/2022, 2:24 PM

## 2022-05-09 ENCOUNTER — Ambulatory Visit: Payer: Medicare PPO

## 2022-05-09 DIAGNOSIS — M6281 Muscle weakness (generalized): Secondary | ICD-10-CM | POA: Diagnosis not present

## 2022-05-09 DIAGNOSIS — I63511 Cerebral infarction due to unspecified occlusion or stenosis of right middle cerebral artery: Secondary | ICD-10-CM

## 2022-05-09 DIAGNOSIS — R278 Other lack of coordination: Secondary | ICD-10-CM

## 2022-05-12 NOTE — Therapy (Signed)
OUTPATIENT OCCUPATIONAL THERAPY NEURO TREATMENT NOTE          Patient Name: Teresa [name] MRN: [id number] DOB:1940/12/07, 82 y.o., female Today's Date: 12/13/2021  PCP: Dr. [name] REFERRING PROVIDER: Dr. [name]   OT End of Session - 05/12/22 1445     Visit Number 97    Number of Visits 117    Date for OT Re-Evaluation 07/18/22    Authorization Time Period Reporting period beginning 04/16/22    OT Start Time 1145    OT Stop Time 1230    OT Time Calculation (min) 45 min    Equipment Utilized During Treatment cane    Activity Tolerance Patient tolerated treatment well    Behavior During Therapy WFL for tasks assessed/performed             Past Medical History:  Diagnosis Date   Cancer ([place])    skin   Hypothyroidism    Raynaud's disease    Stroke [facility])    Past Surgical History:  Procedure Laterality Date   [place]  2010   Cervical fusion C4-5-6-7   CATARACT EXTRACTION, BILATERAL Bilateral 10/2016   CHOLECYSTECTOMY  1995   COLONOSCOPY WITH PROPOFOL N/A 12/08/2018   Procedure: COLONOSCOPY WITH PROPOFOL;  Surgeon: Toledo, Benay Pike, MD;  Location: [facility] ENDOSCOPY;  Service: Gastroenterology;  Laterality: N/A;   EYE SURGERY     Patient Active Problem List   Diagnosis Date Noted   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  Following inpt rehab, pt went home and had  home health.   PRECAUTIONS: fall  SUBJECTIVE: Pt reports doing well today.  Pt states she has not been out walking lately due to the weather.  PAIN:  Are you having pain? 2 (neck and low back); improves with moist heat OBJECTIVE:  L grip 6, R grip 41 L lateral pinch 5, R 13 L 3 point pinch 4, R 14  L 9 hole 1 min 43 sec. L shoulder active flexion 0-105, passive 0-125 02/05/22: L grip 13#  L lateral pinch 8#  3 point pinch 5#  L shoulder flex 0-108 03/12/22:  L grip: 12# L lateral pinch: 8# 3 point pinch: 6# L 9 hole: 1 min 3 sec L shoulder flexion 0-110 04/16/22: L grip: 3# L lateral pinch: 4# 3 point pinch: difficulty maintaining 3 point pinch  L 9 hole: 5 pegs in 2 min 36 sec  L shoulder flexion 0-107 BP L 132/79 HR 80 02 sats 96% 04/25/22: L grip: 14#; R grip: 50# L lateral 2#, R lateral 17# 3 point pinch: L 4#, R 14# (Saehan pinch gauge) 9 hole peg test: 1 min 48 sec L shoulder flex 125 (R shoulder 132 active)   TODAY'S TREATMENT: Therapeutic Exercise: Facilitated L grip strengthening with hand gripper set at moderate resistance with 1 green band to complete 3 sets 10 reps.  Rest between sets  and cues for technique to maximize hand closure with each squeeze. Pt used 2# dumbbell to complete L wrist flex/ext/RD for 3 sets 10 reps each.  Pt required mod A to initiate each wrist position and tactile cues to isolate forearm.  Pt performed forearm pron/sup and elbow flex/ext with 2# weight for 3 sets 10 reps each, min A to position L elbow at side during above.  Facilitated L forearm, wrist, and hand strengthening with participation in [place] board tools.  Pt worked with long handled tool to facilitate L wrist flex/ext, large base key turn, and small and large dial turn x2-3 reps for each tool (up/down board=1 rep).  Rest breaks between sets and min-mod vc for technique to maximize ROM with each tool rotation against wide strip of velcro resistance.    PATIENT  EDUCATION: Education details: LUE strengthening Person educated: pt Education method: Explanation and Verbal cues Education comprehension: verbalized understanding   HOME EXERCISE PROGRAM Continue to engage LUE into ADLs; continue use of putty for gripping and pinching exercises for LUE, and L shoulder AROM/AAROM, crocheting, typing; increase participation in IADL tasks for greater use of L hand.    OT Long Term Goals - 06/06/22 (6 weeks)      OT LONG TERM GOAL #1   Title Pt will be independent with home exercise program.    Baseline Eval: no current program, 10th visit:  continue to add new exercises as pt progresses, 20th:  continue to update HEP; 08/17/21: continue to progress HEP when indicated.  Visit 40: adding new exercises as pt progresses; 12/25/21: ongoing with progressions; 03/12/22: inconsistent use of putty but regular use of stress ball (every other day).  Encouraged pt return to daily putty use (2x daily when able) to target grip and pinch strengthening; 04/16/22: not reviewed today d/t OT sent pt to ED; 04/25/22: added pulleys and pt has re-started putty   Time 12    Period Weeks    Status On-going    Target Date 06/06/22     OT LONG TERM GOAL #2   Title Pt will complete UB and LB dressing with modified independence including buttons, snaps and zippers.    Baseline requires min assist at eval, 10th visit: occasional assist with buttons, 20th:  able to perform one handed, but difficulty with bilateral UE; 08/17/21: pt reports inconsistent with 1 hand, reviewed techniques this visit, Visit 40:  Pt requires assist with bra   Time 12    Period Weeks    Status MET   Target Date 11/08/21     OT LONG TERM GOAL #3   Title Pt will perform shower transfer with modified independence.    Baseline Pt requires supervision to min assist for shower transfer at home. 10th visit: supervision; 08/17/21: supv .  Visit 40:  Pt had met goal but had recent fall and now requiring min guard to supv    Time 6    Period Weeks    Status  Met    Target Date 11/08/21      OT LONG TERM GOAL #4   Title Pt will improve L hand grip to 20 or more #s to enable pt to open a new jar.   Baseline no grip in left hand at eval, 10th visit:  improved flexion but still working towards composite fisting and grip. 20th:  continues to demo decreased grip; 08/17/21: active digit flexion improving, but not yet able to register grip on dynamometer; 09/04/21: L grip 1# 7/17:  5#; 12/25/21: L grip 6#; 02/05/22: 13#; 03/12/22: L grip 12#; 1/23//24: L grip 3# (decreased); 04/25/22: L grip 14#; spouse assists to open new jars   Time 12    Period Weeks    Status Revised   Target Date 07/18/22     OT LONG TERM GOAL #5   Title Pt will improve left shoulder flexion to 100 degrees or better to improve reaching to obtain self care items from shelf/shoulder height.    Baseline difficulty with reach, shoulder flexion to 47 degrees; 08/17/21: L shoulder flexion 85, but not yet able to consistently hold ADL supplies in L hand when reaching; 09/04/21: flexion 85 7/17:  shoulder flexion to 90; 12/25/21: 105 (P 125); 03/12/22: active L shoulder flexion 110 (pt uses R arm to reach for items at shoulder height or above); 04/16/22: L shoulder flexion 107; 04/25/22: L shoulder flexion 125*, R 132*    Time 12    Period Weeks    Status achieved   Target Date 04/25/22     OT LONG TERM GOAL #6   Title Pt will improve FOTO score to 47 or above to demonstrate a clinically relevant change in function to impact ADL tasks.    Baseline score of 30 at eval; 08/17/21: FOTO: 42; 09/04/21: FOTO : 54 , FOTO 7/7:  60; 12/25/21: 59; 03/12/22: 66; 04/16/22: 67; 04/25/22; 51   Time 12    Period Weeks    Status  MET/ongoing    Target Date 04/25/22     OT LONG TERM GOAL #7   Title Pt will demonstrate ability to pick up small objects and complete 9 hole peg test in less than 1 min 30 sec (revised from under 2 min)   Baseline unable to perform at eval, 10th visit: still  unable to pick up small pegs, 20th: unable to pick up pegs but improving; 08/17/21: not attempted d/t time constraints, will assess next visit; 09/04/21: 9 hole peg test in 5 min 53 sec;  7/17:  Pt unable to complete this date but was able to place 6 of 9 pegs in 4 mins 20 secs; 12/25/21: L 1 min 43 sec; 03/12/22: L 1 min 2 sec (requires non-skid surface to pick up small items from table top); 04/16/22: 5 pegs in 2 min 36 secs; 04/25/22: 1 min 48 sec   Time 12    Period Weeks    Status Revised/On-going    Target Date 07/18/22    OT LONG TERM GOAL #8   Title Pt will increase LUE strength by 1 MM grade in order to hold blow dryer in LUE to dry hair without dropping dryer.   Baseline 04/25/22: Unable to sustain grip and maintain lift of LUE with hand near head without dropping dryer.  Dryer has caused bruising above L eye with previous attempts.  L shoulder flex/abd 3/5, elbow flex/ext 3+/5    Time 12    Period Weeks    Status New    Target Date 07/18/22        OT LONG TERM GOAL #9   Title Pt will increase L hand dexterity to enable pt to independently manage clothing fasteners.   Baseline 04/25/22: pt ties shoe laces loosely and requires assist from spouse occasionally.  When typing, pt engages the L hand for ~25% of the task.  Pt avoids wearing jeans d/t inability to manage button or zipper.     Time 12    Period Weeks    Status New  Target Date 07/18/22        OT LONG TERM GOAL #10   Title Pt will be able to independently carry a light plate of food or drink in L hand without spilling/dropping to increase efficiency with item transport in the kitchen.   Baseline 04/25/22: Pt requires constant cues to keep cup or plate level while multitasking in order to prevent spilling.   Time 12    Period Weeks    Status New    Target Date 07/18/22     Plan -     Clinical Impression Statement Pt was able to tolerate increased resistance levels this date, matching her previous levels for the first time  since her recent episode of regression with her ED visit on 04/16/22 where she experienced new onset of L sided weakness.  Pt tolerated moderate resistance with hand gripper (green band) and 2# dumbbell for L wrist, forearm, and elbow strengthening.  Pt continues to required vc and tactile cues for form and body mechanics.  With use of [place] board, pt continues to require increased time to rotate tools, though she did not require OT assist to keep tools level this date.  Pt will continue to work towards goals in plan of care to improve left UE function, decrease pain and increase active normal movement patterns for daily tasks.    OT Occupational Profile and History Detailed Assessment- Review of Records and additional review of physical, cognitive, psychosocial history related to current functional performance    Occupational performance deficits (Please refer to evaluation for details): ADL's;IADL's;Leisure;Rest and Sleep    Body Structure / Function / Physical Skills ADL;Coordination;Endurance;GMC;UE functional use;Balance;IADL;Pain;Dexterity;FMC;Strength;Edema;Mobility;ROM    Psychosocial Skills Environmental  Adaptations;Habits;Routines and Behaviors    Rehab Potential Good    Clinical Decision Making Several treatment options, min-mod task modification necessary   Comorbidities Affecting Occupational Performance: May have comorbidities impacting occupational performance    Modification or Assistance to Complete Evaluation  Min-Moderate modification of tasks or assist with assess necessary to complete eval    OT Frequency 2x / week    OT Duration 12 weeks    OT Treatment/Interventions Self-care/ADL training;Cryotherapy;Paraffin;Therapeutic exercise;DME and/or AE instruction;Functional Mobility Training;Balance training;Electrical Stimulation;Ultrasound;Neuromuscular education;Manual Therapy;Splinting;Moist Heat;Contrast Bath;Passive range of motion;Therapeutic activities;Patient/family education;Coping  strategies training    Plan OT recert    Consulted and Agree with Plan of Care Patient            Leta Speller, MS, OTR/L  Darleene Cleaver, OT 05/12/2022, 2:47 PM

## 2022-05-14 ENCOUNTER — Ambulatory Visit: Payer: Medicare PPO

## 2022-05-14 DIAGNOSIS — R278 Other lack of coordination: Secondary | ICD-10-CM

## 2022-05-14 DIAGNOSIS — M6281 Muscle weakness (generalized): Secondary | ICD-10-CM

## 2022-05-14 DIAGNOSIS — I63511 Cerebral infarction due to unspecified occlusion or stenosis of right middle cerebral artery: Secondary | ICD-10-CM

## 2022-05-15 NOTE — Therapy (Signed)
OUTPATIENT OCCUPATIONAL THERAPY NEURO TREATMENT NOTE          Patient Name: Teresa [name] MRN: [id number] DOB:1940/07/13, 82 y.o., female Today's Date: 12/13/2021  PCP: Dr. [name] REFERRING PROVIDER: Dr. [name]   OT End of Session - 05/15/22 0955     Visit Number 98    Number of Visits 117    Date for OT Re-Evaluation 07/18/22    Authorization Time Period Reporting period beginning 04/16/22    OT Start Time 1148    OT Stop Time 1230    OT Time Calculation (min) 42 min    Equipment Utilized During Treatment cane    Activity Tolerance Patient tolerated treatment well    Behavior During Therapy [facility] for tasks assessed/performed             Past Medical History:  Diagnosis Date   Cancer ([place])    skin   Hypothyroidism    Raynaud's disease    Stroke [facility])    Past Surgical History:  Procedure Laterality Date   [place]  2010   Cervical fusion C4-5-6-7   CATARACT EXTRACTION, BILATERAL Bilateral 10/2016   CHOLECYSTECTOMY  1995   COLONOSCOPY WITH PROPOFOL N/A 12/08/2018   Procedure: COLONOSCOPY WITH PROPOFOL;  Surgeon: Toledo, Benay Pike, MD;  Location: [facility] ENDOSCOPY;  Service: Gastroenterology;  Laterality: N/A;   EYE SURGERY     Patient Active Problem List   Diagnosis Date Noted   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   Stroke ([place]) 02/27/2021   History of nonmelanoma skin cancer 05/23/2014   ONSET DATE: 02/26/2021  REFERRING DIAG: R MCA CVA  THERAPY DIAG:  Muscle weakness (generalized)  Other lack of coordination  Right middle cerebral artery stroke [facility])  Rationale for Evaluation and Treatment Rehabilitation  PERTINENT HISTORY: February 26, 2021, pt reports she had a CVA, came to [facility] to ER and then was transferred to [facility] in [place] where she was admitted and after acute care she went to inpatient rehab.  Following inpt rehab, pt went home and had  home health.   PRECAUTIONS: fall  SUBJECTIVE: Pt reports doing well today.  PAIN:  Are you having pain? 0  OBJECTIVE:  L grip 6, R grip 41 L lateral pinch 5, R 13 L 3 point pinch 4, R 14  L 9 hole 1 min 43 sec. L shoulder active flexion 0-105, passive 0-125 02/05/22: L grip 13#  L lateral pinch 8#  3 point pinch 5#  L shoulder flex 0-108 03/12/22:  L grip: 12# L lateral pinch: 8# 3 point pinch: 6# L 9 hole: 1 min 3 sec L shoulder flexion 0-110 04/16/22: L grip: 3# L lateral pinch: 4# 3 point pinch: difficulty maintaining 3 point pinch  L 9 hole: 5 pegs in 2 min 36 sec  L shoulder flexion 0-107 BP L 132/79 HR 80 02 sats 96% 04/25/22: L grip: 14#; R grip: 50# L lateral 2#, R lateral 17# 3 point pinch: L 4#, R 14# (Saehan pinch gauge) 9 hole peg test: 1 min 48 sec L shoulder flex 125 (R shoulder 132 active)   TODAY'S TREATMENT:  Self Care: Pt practiced grasping, transporting, and reaching for objects in L hand to simulate functional use of L hand for daily tasks.  Pt practiced grasping various size size balls, cones, weights, and containers and moving these items with L hand from tray table to mat  table at a lower height, then transporting these items across the room and placing them on a second shelf ~shoulder height, then removing from this height and transporting back to mat table. Pt practiced opening lid (tennis ball can lid) with L hand while stabilizing can in R hand, requiring mod vc for technique on 2 attempts.  Pt practiced carrying open container of blocks in L hand around therapy gym, requiring min vc to maintain level container to prevent dropping blocks; pt requires vision on container to adjust level.    Neuro re-ed: Facilitated L hand FMC/dexterity skills working to pick up small pegs and place into pegboard.  Pt practiced picking up pegs 1 at a time without a non-skid surface this date, but did intermittently use the ridge of the storage lid where pegs were  placed to ease pick up.  Pt required intermittent rest breaks and use of hand warmers to improve dexterity d/t cold fingertips from Raynaud's. Pt then practiced removing pegs from pegboard using a 2 and 3 point pinch pattern.    PATIENT EDUCATION: Education details: LUE strengthening Person educated: pt Education method: Explanation and Verbal cues Education comprehension: verbalized understanding   HOME EXERCISE PROGRAM Continue to engage LUE into ADLs; continue use of putty for gripping and pinching exercises for LUE, and L shoulder AROM/AAROM, crocheting, typing; increase participation in IADL tasks for greater use of L hand.    OT Long Term Goals - 06/06/22 (6 weeks)      OT LONG TERM GOAL #1   Title Pt will be independent with home exercise program.    Baseline Eval: no current program, 10th visit:  continue to add new exercises as pt progresses, 20th:  continue to update HEP; 08/17/21: continue to progress HEP when indicated.  Visit 40: adding new exercises as pt progresses; 12/25/21: ongoing with progressions; 03/12/22: inconsistent use of putty but regular use of stress ball (every other day).  Encouraged pt return to daily putty use (2x daily when able) to target grip and pinch strengthening; 04/16/22: not reviewed today d/t OT sent pt to ED; 04/25/22: added pulleys and pt has re-started putty   Time 12    Period Weeks    Status On-going    Target Date 06/06/22     OT LONG TERM GOAL #2   Title Pt will complete UB and LB dressing with modified independence including buttons, snaps and zippers.    Baseline requires min assist at eval, 10th visit: occasional assist with buttons, 20th:  able to perform one handed, but difficulty with bilateral UE; 08/17/21: pt reports inconsistent with 1 hand, reviewed techniques this visit, Visit 40:  Pt requires assist with bra   Time 12    Period Weeks    Status MET   Target Date 11/08/21     OT LONG TERM GOAL #3   Title Pt will perform shower  transfer with modified independence.    Baseline Pt requires supervision to min assist for shower transfer at home. 10th visit: supervision; 08/17/21: supv .  Visit 40:  Pt had met goal but had recent fall and now requiring min guard to supv   Time 6    Period Weeks    Status  Met    Target Date 11/08/21      OT LONG TERM GOAL #4   Title Pt will improve L hand grip to 20 or more #s to enable pt to open a new jar.   Baseline no grip in left  hand at eval, 10th visit:  improved flexion but still working towards composite fisting and grip. 20th:  continues to demo decreased grip; 08/17/21: active digit flexion improving, but not yet able to register grip on dynamometer; 09/04/21: L grip 1# 7/17:  5#; 12/25/21: L grip 6#; 02/05/22: 13#; 03/12/22: L grip 12#; 1/23//24: L grip 3# (decreased); 04/25/22: L grip 14#; spouse assists to open new jars   Time 12    Period Weeks    Status Revised   Target Date 07/18/22     OT LONG TERM GOAL #5   Title Pt will improve left shoulder flexion to 100 degrees or better to improve reaching to obtain self care items from shelf/shoulder height.    Baseline difficulty with reach, shoulder flexion to 47 degrees; 08/17/21: L shoulder flexion 85, but not yet able to consistently hold ADL supplies in L hand when reaching; 09/04/21: flexion 85 7/17:  shoulder flexion to 90; 12/25/21: 105 (P 125); 03/12/22: active L shoulder flexion 110 (pt uses R arm to reach for items at shoulder height or above); 04/16/22: L shoulder flexion 107; 04/25/22: L shoulder flexion 125*, R 132*    Time 12    Period Weeks    Status achieved   Target Date 04/25/22     OT LONG TERM GOAL #6   Title Pt will improve FOTO score to 47 or above to demonstrate a clinically relevant change in function to impact ADL tasks.    Baseline score of 30 at eval; 08/17/21: FOTO: 42; 09/04/21: FOTO : 54 , FOTO 7/7:  60; 12/25/21: 59; 03/12/22: 66; 04/16/22: 67; 04/25/22; 51   Time 12    Period Weeks    Status  MET/ongoing     Target Date 04/25/22     OT LONG TERM GOAL #7   Title Pt will demonstrate ability to pick up small objects and complete 9 hole peg test in less than 1 min 30 sec (revised from under 2 min)   Baseline unable to perform at eval, 10th visit: still unable to pick up small pegs, 20th: unable to pick up pegs but improving; 08/17/21: not attempted d/t time constraints, will assess next visit; 09/04/21: 9 hole peg test in 5 min 53 sec;  7/17:  Pt unable to complete this date but was able to place 6 of 9 pegs in 4 mins 20 secs; 12/25/21: L 1 min 43 sec; 03/12/22: L 1 min 2 sec (requires non-skid surface to pick up small items from table top); 04/16/22: 5 pegs in 2 min 36 secs; 04/25/22: 1 min 48 sec   Time 12    Period Weeks    Status Revised/On-going    Target Date 07/18/22    OT LONG TERM GOAL #8   Title Pt will increase LUE strength by 1 MM grade in order to hold blow dryer in LUE to dry hair without dropping dryer.   Baseline 04/25/22: Unable to sustain grip and maintain lift of LUE with hand near head without dropping dryer.  Dryer has caused bruising above L eye with previous attempts.  L shoulder flex/abd 3/5, elbow flex/ext 3+/5    Time 12    Period Weeks    Status New    Target Date 07/18/22        OT LONG TERM GOAL #9   Title Pt will increase L hand dexterity to enable pt to independently manage clothing fasteners.   Baseline 04/25/22: pt ties shoe laces loosely and requires assist from  spouse occasionally.  When typing, pt engages the L hand for ~25% of the task.  Pt avoids wearing jeans d/t inability to manage button or zipper.     Time 12    Period Weeks    Status New    Target Date 07/18/22        OT LONG TERM GOAL #10   Title Pt will be able to independently carry a light plate of food or drink in L hand without spilling/dropping to increase efficiency with item transport in the kitchen.   Baseline 04/25/22: Pt requires constant cues to keep cup or plate level while multitasking in order to  prevent spilling.   Time 12    Period Weeks    Status New    Target Date 07/18/22     Plan -     Clinical Impression Statement Pt practiced grasping, transporting, and reaching for objects in L hand to simulate functional use of L hand for daily tasks.  Pt worked with light items (balls, cones, plastic container of blocks, and reported in general good confidence to grasp and transport these items.  Pt does require intermittent vc when carrying open containers to maintain level container to prevent spilling blocks; pt requires vision on container to adjust levelness.  Increased difficulty with repeated reaching above shoulder level, or when multitasking/vision occluded to prevent dropping items from an open container.  Pt will continue to work towards goals in plan of care to improve left UE function, decrease pain and increase active normal movement patterns for daily tasks.    OT Occupational Profile and History Detailed Assessment- Review of Records and additional review of physical, cognitive, psychosocial history related to current functional performance    Occupational performance deficits (Please refer to evaluation for details): ADL's;IADL's;Leisure;Rest and Sleep    Body Structure / Function / Physical Skills ADL;Coordination;Endurance;GMC;UE functional use;Balance;IADL;Pain;Dexterity;FMC;Strength;Edema;Mobility;ROM    Psychosocial Skills Environmental  Adaptations;Habits;Routines and Behaviors    Rehab Potential Good    Clinical Decision Making Several treatment options, min-mod task modification necessary   Comorbidities Affecting Occupational Performance: May have comorbidities impacting occupational performance    Modification or Assistance to Complete Evaluation  Min-Moderate modification of tasks or assist with assess necessary to complete eval    OT Frequency 2x / week    OT Duration 12 weeks    OT Treatment/Interventions Self-care/ADL training;Cryotherapy;Paraffin;Therapeutic  exercise;DME and/or AE instruction;Functional Mobility Training;Balance training;Electrical Stimulation;Ultrasound;Neuromuscular education;Manual Therapy;Splinting;Moist Heat;Contrast Bath;Passive range of motion;Therapeutic activities;Patient/family education;Coping strategies training    Plan OT recert    Consulted and Agree with Plan of Care Patient            Leta Speller, MS, OTR/L  Darleene Cleaver, OT 05/15/2022, 9:57 AM

## 2022-05-16 ENCOUNTER — Ambulatory Visit: Payer: Medicare PPO | Admitting: Occupational Therapy

## 2022-05-16 DIAGNOSIS — M6281 Muscle weakness (generalized): Secondary | ICD-10-CM | POA: Diagnosis not present

## 2022-05-16 DIAGNOSIS — R278 Other lack of coordination: Secondary | ICD-10-CM

## 2022-05-16 NOTE — Therapy (Signed)
OUTPATIENT OCCUPATIONAL THERAPY NEURO TREATMENT NOTE          Patient Name: Teresa [name] MRN: [id number] DOB:[birth year], 82 y.o., female Today's Date: 12/13/2021  PCP: Dr. [name] REFERRING PROVIDER: Dr. [name]   OT End of Session - 05/16/22 1656     Visit Number 99    Number of Visits 117    Date for OT Re-Evaluation 07/18/22    Authorization Time Period Reporting period beginning 04/16/22    OT Start Time 1145    OT Stop Time 1230    OT Time Calculation (min) 45 min    Activity Tolerance Patient tolerated treatment well    Behavior During Therapy [facility] for tasks assessed/performed             Past Medical History:  Diagnosis Date   Cancer ([place])    skin   Hypothyroidism    Raynaud's disease    Stroke [facility])    Past Surgical History:  Procedure Laterality Date   [place]  2010   Cervical fusion C4-5-6-7   CATARACT EXTRACTION, BILATERAL Bilateral 10/2016   CHOLECYSTECTOMY  1995   COLONOSCOPY WITH PROPOFOL N/A 12/08/2018   Procedure: COLONOSCOPY WITH PROPOFOL;  Surgeon: Toledo, Benay Pike, MD;  Location: [facility] ENDOSCOPY;  Service: Gastroenterology;  Laterality: N/A;   EYE SURGERY     Patient Active Problem List   Diagnosis Date Noted   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  Following inpt rehab, pt went home and had home health.   PRECAUTIONS: fall  SUBJECTIVE: Pt reports doing well today.  PAIN:  Are  you having pain? 0  OBJECTIVE:  L grip 6, R grip 41 L lateral pinch 5, R 13 L 3 point pinch 4, R 14  L 9 hole 1 min 43 sec. L shoulder active flexion 0-105, passive 0-125 02/05/22: L grip 13#  L lateral pinch 8#  3 point pinch 5#  L shoulder flex 0-108 03/12/22:  L grip: 12# L lateral pinch: 8# 3 point pinch: 6# L 9 hole: 1 min 3 sec L shoulder flexion 0-110 04/16/22: L grip: 3# L lateral pinch: 4# 3 point pinch: difficulty maintaining 3 point pinch  L 9 hole: 5 pegs in 2 min 36 sec  L shoulder flexion 0-107 BP L 132/79 HR 80 02 sats 96% 04/25/22: L grip: 14#; R grip: 50# L lateral 2#, R lateral 17# 3 point pinch: L 4#, R 14# (Saehan pinch gauge) 9 hole peg test: 1 min 48 sec L shoulder flex 125 (R shoulder 132 active)   TODAY'S TREATMENT:   Self Care:   Pt. worked on functional reaching with the LUE to retrieve and place items onto shelves higher than shoulder height to challenge left hand function, and hand grasp while the UE is sustained in elevation. Pt. worked on reaching  for various sized containers, cones, and bottles in forward flexion, as well as to the side for abduction/scaption.  Pt. then worked on reaching up into the closet for [employer] with the left hand for increased reps. Pt. worked on grasping the hanger at the neck of the hanger. Pt. worked on retrieving, and [employer] consultant with clothing on them, as well as using her left hand to move, and separate the clothing while on the rack to simulate looking at clothing in her closet, or on a sales rack.     There. Ex.:   Pt. worked on LUE strengthening using 2# dumbbell weight for elbow flexion, extension, forearm supination, pronation, wrist extension, and radial deviation.    PATIENT EDUCATION: Education details: LUE strengthening Person educated: pt Education method: Explanation and Verbal cues Education comprehension: verbalized understanding   HOME EXERCISE PROGRAM Continue to engage LUE into  ADLs; continue use of putty for gripping and pinching exercises for LUE, and L shoulder AROM/AAROM, crocheting, typing; increase participation in IADL tasks for greater use of L hand.    OT Long Term Goals - 06/06/22 (6 weeks)      OT LONG TERM GOAL #1   Title Pt will be independent with home exercise program.    Baseline Eval: no current program, 10th visit:  continue to add new exercises as pt progresses, 20th:  continue to update HEP; 08/17/21: continue to progress HEP when indicated.  Visit 40: adding new exercises as pt progresses; 12/25/21: ongoing with progressions; 03/12/22: inconsistent use of putty but regular use of stress ball (every other day).  Encouraged pt return to daily putty use (2x daily when able) to target grip and pinch strengthening; 04/16/22: not reviewed today d/t OT sent pt to ED; 04/25/22: added pulleys and pt has re-started putty   Time 12    Period Weeks    Status On-going    Target Date 06/06/22     OT LONG TERM GOAL #2   Title Pt will complete UB and LB dressing with modified independence including buttons, snaps and zippers.    Baseline requires min assist at eval, 10th visit: occasional assist with buttons, 20th:  able to perform one handed, but difficulty with bilateral UE; 08/17/21: pt reports inconsistent with 1 hand, reviewed techniques this visit, Visit 40:  Pt requires assist with bra   Time 12    Period Weeks    Status MET   Target Date 11/08/21     OT LONG TERM GOAL #3   Title Pt will perform shower transfer with modified independence.    Baseline Pt requires supervision to min assist for shower transfer at home. 10th visit: supervision; 08/17/21: supv .  Visit 40:  Pt had met goal but had recent fall and now requiring min guard to supv   Time 6    Period Weeks    Status  Met    Target Date 11/08/21      OT LONG TERM GOAL #4   Title Pt will improve L hand grip to 20 or more #s to enable pt to open a new jar.   Baseline no grip in left hand at eval,  10th visit:  improved flexion but still working towards composite fisting and grip. 20th:  continues to demo decreased grip; 08/17/21: active digit flexion improving, but not yet able to register grip on dynamometer; 09/04/21: L grip 1# 7/17:  5#; 12/25/21: L grip 6#; 02/05/22: 13#; 03/12/22: L grip 12#; 1/23//24: L grip 3# (decreased); 04/25/22: L grip 14#; spouse assists to open new jars   Time 12    Period  Weeks    Status Revised   Target Date 07/18/22     OT LONG TERM GOAL #5   Title Pt will improve left shoulder flexion to 100 degrees or better to improve reaching to obtain self care items from shelf/shoulder height.    Baseline difficulty with reach, shoulder flexion to 47 degrees; 08/17/21: L shoulder flexion 85, but not yet able to consistently hold ADL supplies in L hand when reaching; 09/04/21: flexion 85 7/17:  shoulder flexion to 90; 12/25/21: 105 (P 125); 03/12/22: active L shoulder flexion 110 (pt uses R arm to reach for items at shoulder height or above); 04/16/22: L shoulder flexion 107; 04/25/22: L shoulder flexion 125*, R 132*    Time 12    Period Weeks    Status achieved   Target Date 04/25/22     OT LONG TERM GOAL #6   Title Pt will improve FOTO score to 47 or above to demonstrate a clinically relevant change in function to impact ADL tasks.    Baseline score of 30 at eval; 08/17/21: FOTO: 42; 09/04/21: FOTO : 54 , FOTO 7/7:  60; 12/25/21: 59; 03/12/22: 66; 04/16/22: 67; 04/25/22; 51   Time 12    Period Weeks    Status  MET/ongoing    Target Date 04/25/22     OT LONG TERM GOAL #7   Title Pt will demonstrate ability to pick up small objects and complete 9 hole peg test in less than 1 min 30 sec (revised from under 2 min)   Baseline unable to perform at eval, 10th visit: still unable to pick up small pegs, 20th: unable to pick up pegs but improving; 08/17/21: not attempted d/t time constraints, will assess next visit; 09/04/21: 9 hole peg test in 5 min 53 sec;  7/17:  Pt unable to complete this  date but was able to place 6 of 9 pegs in 4 mins 20 secs; 12/25/21: L 1 min 43 sec; 03/12/22: L 1 min 2 sec (requires non-skid surface to pick up small items from table top); 04/16/22: 5 pegs in 2 min 36 secs; 04/25/22: 1 min 48 sec   Time 12    Period Weeks    Status Revised/On-going    Target Date 07/18/22    OT LONG TERM GOAL #8   Title Pt will increase LUE strength by 1 MM grade in order to hold blow dryer in LUE to dry hair without dropping dryer.   Baseline 04/25/22: Unable to sustain grip and maintain lift of LUE with hand near head without dropping dryer.  Dryer has caused bruising above L eye with previous attempts.  L shoulder flex/abd 3/5, elbow flex/ext 3+/5    Time 12    Period Weeks    Status New    Target Date 07/18/22        OT LONG TERM GOAL #9   Title Pt will increase L hand dexterity to enable pt to independently manage clothing fasteners.   Baseline 04/25/22: pt ties shoe laces loosely and requires assist from spouse occasionally.  When typing, pt engages the L hand for ~25% of the task.  Pt avoids wearing jeans d/t inability to manage button or zipper.     Time 12    Period Weeks    Status New    Target Date 07/18/22        OT LONG TERM GOAL #10   Title Pt will be able to independently carry a light plate of food or  drink in L hand without spilling/dropping to increase efficiency with item transport in the kitchen.   Baseline 04/25/22: Pt requires constant cues to keep cup or plate level while multitasking in order to prevent spilling.   Time 12    Period Weeks    Status New    Target Date 07/18/22     Plan -     Clinical Impression Statement Pt. Continues to require intermittent use of hand warmers during the exercises. Pt. Required cues, and occasional assist with reposition the weight in her hand during the exercises when dual tasking with holding a conversation. Pt. was able to reach for containers on a shelf just above shoulder height with the left hand. Pt.  Presented with increased compensation proximally leaning to the right, as well as eliciting associated reflexive reactions from the right UE when reaching higher in the closet for hangers. Pt. Required cues to tuck right hand at her side, and back when reaching higher. Pt will continue to work towards goals in plan of care to improve left UE function, decrease pain and increase active normal movement patterns for daily tasks.    OT Occupational Profile and History Detailed Assessment- Review of Records and additional review of physical, cognitive, psychosocial history related to current functional performance    Occupational performance deficits (Please refer to evaluation for details): ADL's;IADL's;Leisure;Rest and Sleep    Body Structure / Function / Physical Skills ADL;Coordination;Endurance;GMC;UE functional use;Balance;IADL;Pain;Dexterity;FMC;Strength;Edema;Mobility;ROM    Psychosocial Skills Environmental  Adaptations;Habits;Routines and Behaviors    Rehab Potential Good    Clinical Decision Making Several treatment options, min-mod task modification necessary   Comorbidities Affecting Occupational Performance: May have comorbidities impacting occupational performance    Modification or Assistance to Complete Evaluation  Min-Moderate modification of tasks or assist with assess necessary to complete eval    OT Frequency 2x / week    OT Duration 12 weeks    OT Treatment/Interventions Self-care/ADL training;Cryotherapy;Paraffin;Therapeutic exercise;DME and/or AE instruction;Functional Mobility Training;Balance training;Electrical Stimulation;Ultrasound;Neuromuscular education;Manual Therapy;Splinting;Moist Heat;Contrast Bath;Passive range of motion;Therapeutic activities;Patient/family education;Coping strategies training    Plan OT recert    Consulted and Agree with Plan of Care Patient           Harrel Carina, MS, OTR/L   Harrel Carina, OT 05/16/2022, 4:58 PM

## 2022-05-21 ENCOUNTER — Ambulatory Visit: Payer: Medicare PPO

## 2022-05-21 DIAGNOSIS — M6281 Muscle weakness (generalized): Secondary | ICD-10-CM

## 2022-05-21 DIAGNOSIS — I63511 Cerebral infarction due to unspecified occlusion or stenosis of right middle cerebral artery: Secondary | ICD-10-CM

## 2022-05-21 DIAGNOSIS — R278 Other lack of coordination: Secondary | ICD-10-CM

## 2022-05-21 NOTE — Therapy (Unsigned)
OUTPATIENT OCCUPATIONAL THERAPY NEURO PROGRESS AND TREATMENT NOTE Reporting period beginning 04/16/22-05/21/22          Patient Name: Teresa [name] MRN: [id number] DOB:Jun 27, 1940, 82 y.o., female Today's Date: 12/13/2021  PCP: Dr. [name] REFERRING PROVIDER: Dr. [name]   OT End of Session - 05/21/22 1546     Visit Number 100    Number of Visits 117    Date for OT Re-Evaluation 07/18/22    Authorization Time Period Reporting period beginning 04/16/22-05/21/22    Progress Note Due on Visit 10    OT Start Time 1100    OT Stop Time 1145    OT Time Calculation (min) 45 min    Equipment Utilized During Treatment cane    Activity Tolerance Patient tolerated treatment well    Behavior During Therapy WFL for tasks assessed/performed             Past Medical History:  Diagnosis Date   Cancer ([place])    skin   Hypothyroidism    Raynaud's disease    Stroke [facility])    Past Surgical History:  Procedure Laterality Date   [place]  2010   Cervical fusion C4-5-6-7   CATARACT EXTRACTION, BILATERAL Bilateral 10/2016   CHOLECYSTECTOMY  1995   COLONOSCOPY WITH PROPOFOL N/A 12/08/2018   Procedure: COLONOSCOPY WITH PROPOFOL;  Surgeon: Toledo, Benay Pike, MD;  Location: [facility] ENDOSCOPY;  Service: Gastroenterology;  Laterality: N/A;   EYE SURGERY     Patient Active Problem List   Diagnosis Date Noted   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  Following inpt rehab, pt went home and had home health.   PRECAUTIONS: fall  SUBJECTIVE: Pt reports that her L foot is dragging less now that she's changed to a lighter tennis shoe.  PAIN:  Are you having pain? 0  OBJECTIVE:  L grip 6, R grip 41 L lateral pinch 5, R 13 L 3 point pinch 4, R 14  L 9 hole 1 min 43 sec. L shoulder active flexion 0-105, passive 0-125 02/05/22: L grip 13#  L lateral pinch 8#  3 point pinch 5#  L shoulder flex 0-108 03/12/22:  L grip: 12# L lateral pinch: 8# 3 point pinch: 6# L 9 hole: 1 min 3 sec L shoulder flexion 0-110 04/16/22: L grip: 3# L lateral pinch: 4# 3 point pinch: difficulty maintaining 3 point pinch  L 9 hole: 5 pegs in 2 min 36 sec  L shoulder flexion 0-107 BP L 132/79 HR 80 02 sats 96% 04/25/22: L grip: 14#; R grip: 50# L lateral 2#, R lateral 17# 3 point pinch: L 4#, R 14# (Saehan pinch gauge) 9 hole peg test: 1 min, 48 sec L shoulder flex 125 (R shoulder 132 active) 05/21/22: L grip: 15#; R grip: 50# L lateral 8#, R lateral 20# 3 point pinch: L 4#, R 12# (Saehan pinch gauge) 9  hole peg test: 1 min, 23 sec  TODAY'S TREATMENT: Therapeutic Exercise: Facilitated hand strengthening with use of hand gripper set at 6.6# to remove jumbo pegs from pegboard x2 trials using L hand.  Peg container was placed to facilitate forward and lateral reaching encouraging near full extension of the elbow.  Pt required intermittent cues for rest breaks and repositioning hand gripper in hand to engage the L SF into gripping patterns.  Neuro re-ed: Facilitated L FMC/dexterity skills and functional reaching working to pick up small glass stones from table top.  Practiced first using non-skid surface and progressed to pegs on table top without non-skid surface, working to pick up stones flat side down and up (flat side down more challenging).  Pt practiced scooping up several pegs at a time,  storing in hand, and discarding pegs into a container, again targeting forward and lateral reaching with an outstretched arm in place of a forward lean.  OT provided intermittent tactile cues at trunk and triceps to isolate shoulder from trunk in combination with elbow extension.  Pt practiced picking up 1 stone at a time and storing up to 4-5 in hand.    PATIENT EDUCATION: Education details: normalized reaching patterns Person educated: pt Education method: Explanation and Verbal cues, demo Education comprehension: verbalized understanding, returned demo with tactile and vc   HOME EXERCISE PROGRAM Continue to engage LUE into ADLs; continue use of putty for gripping and pinching exercises for LUE, and L shoulder AROM/AAROM, crocheting, typing; increase participation in IADL tasks for greater use of L hand.    OT Long Term Goals - 06/06/22 (6 weeks)      OT LONG TERM GOAL #1   Title Pt will be independent with home exercise program.    Baseline Eval: no current program, 10th visit:  continue to add new exercises as pt progresses, 20th:  continue to update HEP; 08/17/21: continue to progress HEP when indicated.  Visit 40: adding new exercises as pt progresses; 12/25/21: ongoing with progressions; 03/12/22: inconsistent use of putty but regular use of stress ball (every other day).  Encouraged pt return to daily putty use (2x daily when able) to target grip and pinch strengthening; 04/16/22: not reviewed today d/t OT sent pt to ED; 04/25/22: added pulleys and pt has re-started putty; 05/21/22: stopped pulleys and added stronger theraputty and encouraged pt crochet often    Time 12    Period Weeks    Status On-going    Target Date 06/06/22     OT LONG TERM GOAL #2   Title Pt will complete UB and LB dressing with modified independence including buttons, snaps and zippers.    Baseline requires min assist at eval, 10th visit: occasional assist with buttons, 20th:  able to perform one handed, but  difficulty with bilateral UE; 08/17/21: pt reports inconsistent with 1 hand, reviewed techniques this visit, Visit 40:  Pt requires assist with bra   Time 12    Period Weeks    Status MET   Target Date 11/08/21     OT LONG TERM GOAL #3   Title Pt will perform shower transfer with modified independence.    Baseline Pt requires supervision to min assist for shower transfer at home. 10th visit: supervision; 08/17/21: supv .  Visit 40:  Pt had met goal but had recent fall and now requiring min guard to supv   Time 6    Period Weeks    Status  Met    Target Date  11/08/21      OT LONG TERM GOAL #4   Title Pt will improve L hand grip to 20 or more #s to enable pt to open a new jar.   Baseline no grip in left hand at eval, 10th visit:  improved flexion but still working towards composite fisting and grip. 20th:  continues to demo decreased grip; 08/17/21: active digit flexion improving, but not yet able to register grip on dynamometer; 09/04/21: L grip 1# 7/17:  5#; 12/25/21: L grip 6#; 02/05/22: 13#; 03/12/22: L grip 12#; 1/23//24: L grip 3# (decreased); 04/25/22: L grip 14#; spouse assists to open new jars; 05/21/22: L grip 15#   Time 12    Period Weeks    Status Revised   Target Date 07/18/22     OT LONG TERM GOAL #5   Title Pt will improve left shoulder flexion to 100 degrees or better to improve reaching to obtain self care items from shelf/shoulder height.    Baseline difficulty with reach, shoulder flexion to 47 degrees; 08/17/21: L shoulder flexion 85, but not yet able to consistently hold ADL supplies in L hand when reaching; 09/04/21: flexion 85 7/17:  shoulder flexion to 90; 12/25/21: 105 (P 125); 03/12/22: active L shoulder flexion 110 (pt uses R arm to reach for items at shoulder height or above); 04/16/22: L shoulder flexion 107; 04/25/22: L shoulder flexion 125*, R 132*    Time 12    Period Weeks    Status achieved   Target Date 04/25/22     OT LONG TERM GOAL #6   Title Pt will improve FOTO  score to 47 or above to demonstrate a clinically relevant change in function to impact ADL tasks.    Baseline score of 30 at eval; 08/17/21: FOTO: 42; 09/04/21: FOTO : 54 , FOTO 7/7:  60; 12/25/21: 59; 03/12/22: 66; 04/16/22: 67; 04/25/22; 51; 05/21/22: 74   Time 12    Period Weeks    Status  MET/ongoing    Target Date 04/25/22     OT LONG TERM GOAL #7   Title Pt will demonstrate ability to pick up small objects and complete 9 hole peg test in less than 1 min 30 sec (revised from under 2 min)   Baseline unable to perform at eval, 10th visit: still unable to pick up small pegs, 20th: unable to pick up pegs but improving; 08/17/21: not attempted d/t time constraints, will assess next visit; 09/04/21: 9 hole peg test in 5 min 53 sec;  7/17:  Pt unable to complete this date but was able to place 6 of 9 pegs in 4 mins 20 secs; 12/25/21: L 1 min 43 sec; 03/12/22: L 1 min 2 sec (requires non-skid surface to pick up small items from table top); 04/16/22: 5 pegs in 2 min 36 secs; 04/25/22: 1 min 48 sec; 05/21/22: L 1 min 23 sec   Time 12    Period Weeks    Status Revised/On-going    Target Date 07/18/22    OT LONG TERM GOAL #8   Title Pt will increase LUE strength by 1 MM grade in order to hold blow dryer in LUE to dry hair without dropping dryer.   Baseline 04/25/22: Unable to sustain grip and maintain lift of LUE with hand near head without dropping dryer.  Dryer has caused bruising above L eye with previous attempts.  L shoulder flex/abd 3/5, elbow flex/ext 3+/5    Time 12    Period Weeks  Status New    Target Date 07/18/22        OT LONG TERM GOAL #9   Title Pt will increase L hand dexterity to enable pt to independently manage clothing fasteners.   Baseline 04/25/22: pt ties shoe laces loosely and requires assist from spouse occasionally.  When typing, pt engages the L hand for ~25% of the task.  Pt avoids wearing jeans d/t inability to manage button or zipper.     Time 12    Period Weeks    Status New     Target Date 07/18/22        OT LONG TERM GOAL #10   Title Pt will be able to independently carry a light plate of food or drink in L hand without spilling/dropping to increase efficiency with item transport in the kitchen.   Baseline 04/25/22: Pt requires constant cues to keep cup or plate level while multitasking in order to prevent spilling.   Time 12    Period Weeks    Status New    Target Date 07/18/22     Plan -     Clinical Impression Statement Pt seen this date for 100th visit progress update.  FOTO score improved from 67 in January to 74 this period.  L grip, lateral pinch, and L hand [facility] skills continue to improve.  Pt's measures show continued improvement again after set back in late January when pt went to the ED for new onset of weakness.   Theraputty has been upgraded to next resistance level and pt has been encouraged to begin working on her crocheting at home as part of her [facility] training.  Continued focus on gross gripping d/t weakness in L hand limiting ability to hold and carry ADL supplies.  Focussed today on normalized movement patterns with LUE reaching forward and laterally with an outstretched arm.  Pt requires vc and tactile cues at the trunk to avoid leaning, and tactile cues at the triceps for fully extending elbow when reaching.  Pt will continue to work towards goals in plan of care to improve left UE function, decrease pain and increase active normal movement patterns for daily tasks.    OT Occupational Profile and History Detailed Assessment- Review of Records and additional review of physical, cognitive, psychosocial history related to current functional performance    Occupational performance deficits (Please refer to evaluation for details): ADL's;IADL's;Leisure;Rest and Sleep    Body Structure / Function / Physical Skills ADL;Coordination;Endurance;GMC;UE functional use;Balance;IADL;Pain;Dexterity;FMC;Strength;Edema;Mobility;ROM    Psychosocial Skills Environmental   Adaptations;Habits;Routines and Behaviors    Rehab Potential Good    Clinical Decision Making Several treatment options, min-mod task modification necessary   Comorbidities Affecting Occupational Performance: May have comorbidities impacting occupational performance    Modification or Assistance to Complete Evaluation  Min-Moderate modification of tasks or assist with assess necessary to complete eval    OT Frequency 2x / week    OT Duration 12 weeks    OT Treatment/Interventions Self-care/ADL training;Cryotherapy;Paraffin;Therapeutic exercise;DME and/or AE instruction;Functional Mobility Training;Balance training;Electrical Stimulation;Ultrasound;Neuromuscular education;Manual Therapy;Splinting;Moist Heat;Contrast Bath;Passive range of motion;Therapeutic activities;Patient/family education;Coping strategies training    Plan OT recert    Consulted and Agree with Plan of Care Patient           Leta Speller, MS, OTR/L   Darleene Cleaver, OT 05/21/2022, 3:48 PM

## 2022-05-22 ENCOUNTER — Ambulatory Visit: Payer: Medicare PPO

## 2022-05-23 ENCOUNTER — Encounter: Payer: Self-pay | Admitting: Occupational Therapy

## 2022-05-23 ENCOUNTER — Ambulatory Visit: Payer: Medicare PPO

## 2022-05-23 ENCOUNTER — Ambulatory Visit: Payer: Medicare PPO | Admitting: Occupational Therapy

## 2022-05-23 DIAGNOSIS — M6281 Muscle weakness (generalized): Secondary | ICD-10-CM

## 2022-05-23 DIAGNOSIS — I63511 Cerebral infarction due to unspecified occlusion or stenosis of right middle cerebral artery: Secondary | ICD-10-CM

## 2022-05-23 DIAGNOSIS — R278 Other lack of coordination: Secondary | ICD-10-CM

## 2022-05-23 DIAGNOSIS — R2681 Unsteadiness on feet: Secondary | ICD-10-CM

## 2022-05-23 DIAGNOSIS — R262 Difficulty in walking, not elsewhere classified: Secondary | ICD-10-CM

## 2022-05-23 NOTE — Therapy (Signed)
OUTPATIENT PHYSICAL THERAPY NEURO EVALUATION   Patient Name: Teresa [name] MRN: [id number] DOB:[birth year], 82 y.o., female Today's Date: 05/23/2022   PCP: Dr. [name] REFERRING PROVIDER: Dr. [name]  END OF SESSION:  PT End of Session - 05/23/22 1153     Visit Number 1    Number of Visits 24    Date for PT Re-Evaluation 08/15/22    Progress Note Due on Visit 10    PT Start Time 1145    Equipment Utilized During Treatment Gait belt    Activity Tolerance Patient tolerated treatment well    Behavior During Therapy [facility] for tasks assessed/performed             Past Medical History:  Diagnosis Date   Cancer ([place])    skin   Hypothyroidism    Raynaud's disease    Stroke [facility])    Past Surgical History:  Procedure Laterality Date   [place]  2010   Cervical fusion C4-5-6-7   CATARACT EXTRACTION, BILATERAL Bilateral 10/2016   CHOLECYSTECTOMY  1995   COLONOSCOPY WITH PROPOFOL N/A 12/08/2018   Procedure: COLONOSCOPY WITH PROPOFOL;  Surgeon: Toledo, Benay Pike, MD;  Location: [facility] ENDOSCOPY;  Service: Gastroenterology;  Laterality: N/A;   EYE SURGERY     Patient Active Problem List   Diagnosis Date Noted   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   Stroke ([place]) 02/27/2021   History of nonmelanoma skin cancer 05/23/2014    ONSET DATE: 04/16/2022  REFERRING DIAG: 69.354 (ICD-10-CM) - Hemiplegia and hemiparesis following cerebral infarction affecting left non-dominant side   THERAPY DIAG:  Muscle weakness (generalized)  Other lack of coordination  Right middle cerebral artery stroke ([place])  Difficulty in walking, not elsewhere classified  Unsteadiness on feet  Rationale for Evaluation and Treatment: Rehabilitation  SUBJECTIVE:                                                                                                                                                                                              SUBJECTIVE STATEMENT: Patient reports was doing well until 04/16/2022- with slurred speech and increased left LE weakness - all work up was negative for any CVA She reports increased difficulty with walking and decreased balance   Pt accompanied by: significant other  PERTINENT HISTORY: Pt is a 82 y.o. female with referral for hemiplegia with left sided weakness -original CVA on 02/26/21 and new onset of left sided weakness on 04/16/2022.  R MCA CVA on 02/26/21. Pt completed outpatient PT on 01/17/2022 with majority of goals  met. PMH includes: skin cancer, hypothyroidism, and Raynaud's disease, history of cervical fusion in 2010 C4-C7.   PAIN:  Are you having pain? No  PRECAUTIONS: Fall  WEIGHT BEARING RESTRICTIONS: No  FALLS: Has patient fallen in last 6 months? No  LIVING ENVIRONMENT: Lives with: lives with their spouse Lives in: House/apartment Stairs: Yes: External: 1 steps; none Has following equipment at home: Single point cane, Walker - 2 wheeled, shower chair, and Grab bars  PLOF: Independent  PATIENT GOALS: I want to walk without this cane and no falls and enjoy going out to grandkids sporting event  OBJECTIVE:   DIAGNOSTIC FINDINGS: CLINICAL DATA:  Stroke suspected   EXAM: MRI HEAD WITHOUT CONTRAST   TECHNIQUE: Multiplanar, multiecho pulse sequences of the brain and surrounding structures were obtained without intravenous contrast.   COMPARISON:  MRI Head 02/26/21, CT Head 04/16/22   FINDINGS: Brain: Evolving right MCA territory infarct involving the right frontal operculum in the right insular cortex. No acute infarction, hemorrhage, hydrocephalus, extra-axial collection or mass lesion. Small focus of microhemorrhage in the left temporal lobe. There is petechial hemorrhage and/or hemosiderosis in the region of prior right MCA territory infarct. T2/stir hyperintense signal in the right cerebral peduncle is favored to represent  transneuronal degeneration.   Vascular: Major flow voids are preserved.   Skull and upper cervical spine: Normal marrow signal.   Sinuses/Orbits: Bilateral lens replacement. No mastoid or middle ear effusion. Paranasal sinuses are clear.   Other: None.   IMPRESSION: 1. No acute intracranial abnormality. 2. 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 3.     Electronically Signed   By: Marin Roberts M.D.   On: 04/16/2022 13:41  COGNITION: Overall cognitive status: Within functional limits for tasks assessed   SENSATION: WFL  COORDINATION: Intact with each LE  EDEMA:  None observed  MUSCLE TONE: LLE: Within functional limits  DTRs:  Patella 2+ = Normal and Achilles 2+ = Normal  POSTURE: rounded shoulders and forward head  LOWER EXTREMITY ROM:     Active  Right Eval Left Eval  Hip flexion WNL THROUGHOUT LE WNL THROUGHOUT LE  Hip extension    Hip abduction    Hip adduction    Hip internal rotation    Hip external rotation    Knee flexion    Knee extension    Ankle dorsiflexion    Ankle plantarflexion    Ankle inversion    Ankle eversion     (Blank rows = not tested)  LOWER EXTREMITY MMT:    MMT Right Eval Left Eval  Hip flexion 4+ 4  Hip extension 4+ 4  Hip abduction 4+ 4  Hip adduction 4+ 4  Hip internal rotation 4+ 4  Hip external rotation 4+ 4  Knee flexion 4+ 4  Knee extension 5 4  Ankle dorsiflexion 5 4  Ankle plantarflexion    Ankle inversion    Ankle eversion    (Blank rows = not tested)  BED MOBILITY:  Patient reports independent with all bed mobility and states has returned to master bed  TRANSFERS: Assistive device utilized: None  Sit to stand: Complete Independence Stand to sit: Complete Independence Chair to chair: Complete Independence Floor:  Not tested   GAIT: Gait pattern: step through pattern, decreased arm swing- Right, decreased arm swing- Left, decreased  step length- Right, and decreased step length- Left Distance walked: 915 feet Assistive device utilized: None Level of assistance: SBA Comments: occasional scuff  or decreased left foot clearance  FUNCTIONAL TESTS:  5 times sit to stand: 23.62 sec without UE support Timed up and go (TUG): 20.14 sec without an AD 10 meter walk test: 0.70 m/s  PATIENT SURVEYS:  FOTO 60/ goal of 66  TODAY'S TREATMENT:                                                                                                                              DATE: 05/23/2022= PT EVAL only  BP= 140/76 mmHG Right UE (sitting)  PATIENT EDUCATION: Education details: PT plan of care Person educated: Patient Education method: Explanation, Demonstration, Tactile cues, Verbal cues, and Handouts Education comprehension: verbalized understanding, returned demonstration, verbal cues required, tactile cues required, and needs further education  HOME EXERCISE PROGRAM: To be updated next 1-2 visit  GOALS: Goals reviewed with patient? Yes  SHORT TERM GOALS: Target date: 07/04/2022  Patient will be independent in home exercise program to improve strength/mobility for better functional independence with ADLs  Baseline: EVAL: Patient reports mostly just walking  Goal status: INITIAL  LONG TERM GOALS: Target date: 08/14/2021  Pt will decrease 5TSTS by at least 6 seconds in order to demonstrate clinically significant improvement in LE strength. Baseline: EVAL= 23.62 sec without UE  Goal status: INITIAL  2. Pt will improve FOTO to target score of 66 to display perceived improvements in ability to complete ADL's.  Baseline: Eval= 60 Goal status: INITIAL  3.  Pt will decrease TUG to below 14 seconds/decrease in order to demonstrate decreased fall risk. Baseline: Eval= 20.14 sec without AD Goal status: INITIAL  4.  Patient will increase six minute walk test distance to >1200 ft for improve gait ability and return to PLOF to  complete her walks in her neighborhood.  Baseline: EVAL= 915 feet without an AD Goal status: INITIAL 5. Pt will increase 10MWT by at least 0.13 m/s in order to demonstrate clinically significant improvement in community ambulation.    Baseline: Eval = 0.70 m/s  Goal status: INITIAL  ASSESSMENT:  CLINICAL IMPRESSION: Patient is a 82 y.o. female who was seen today for physical therapy evaluation and treatment for increased left sided lower extremity muscle weakness affecting her mobility. She presents with increased weakness and impaired functional mobility since previously discharged from PT services. She is walking mostly with use of cane and her goal is to walk without one. Will plan to further assess balance next session and formulate goal as appropriate. She will benefit from skilled PT services to improve her overall left LE muscle strength and improve her mobility to decrease her fall risk and improve her overall quality of life.   OBJECTIVE IMPAIRMENTS: Abnormal gait, decreased activity tolerance, decreased balance, decreased coordination, decreased endurance, decreased mobility, difficulty walking, and decreased strength.   ACTIVITY LIMITATIONS: carrying, lifting, bending, standing, squatting, stairs, and transfers  PARTICIPATION LIMITATIONS: cleaning, laundry, driving, shopping, community activity, and yard work  PERSONAL FACTORS: 3+ comorbidities: OA, HTN,  hx of previous CVA  are also affecting patient's functional outcome.   REHAB POTENTIAL: Good  CLINICAL DECISION MAKING: Evolving/moderate complexity  EVALUATION COMPLEXITY: Moderate  PLAN:  PT FREQUENCY: 1-2x/week  PT DURATION: 12 weeks  PLANNED INTERVENTIONS: Therapeutic exercises, Therapeutic activity, Neuromuscular re-education, Balance training, Gait training, Patient/Family education, Self Care, Joint mobilization, Joint manipulation, Stair training, Vestibular training, Canalith repositioning, DME instructions, Dry  Needling, Electrical stimulation, Spinal manipulation, Spinal mobilization, Cryotherapy, Moist heat, Taping, and Manual therapy  PLAN FOR NEXT SESSION: Perform balance testing and add goal as appropriate. LE strengthening and add to HEP as appropriate next session.   Lewis Moccasin, PT 05/23/2022, 2:35 PM

## 2022-05-23 NOTE — Therapy (Signed)
OUTPATIENT OCCUPATIONAL THERAPY NEURO TREATMENT NOTE           Patient Name: Teresa [name] MRN: [id number] DOB:1940/10/31, 82 y.o., female Today's Date: 05/23/2022   PCP: Dr. [name] REFERRING PROVIDER: Dr. [name]   OT End of Session - 05/23/22 2020     Visit Number 101    Number of Visits 117    Date for OT Re-Evaluation 07/18/22    Authorization Time Period Reporting period beginning 04/16/22-05/21/22    Progress Note Due on Visit 10    OT Start Time 1100    OT Stop Time 1150    OT Time Calculation (min) 50 min    Equipment Utilized During Treatment cane    Activity Tolerance Patient tolerated treatment well    Behavior During Therapy WFL for tasks assessed/performed             Past Medical History:  Diagnosis Date   Cancer ([place])    skin   Hypothyroidism    Raynaud's disease    Stroke [facility])    Past Surgical History:  Procedure Laterality Date   [place]  2010   Cervical fusion C4-5-6-7   CATARACT EXTRACTION, BILATERAL Bilateral 10/2016   CHOLECYSTECTOMY  1995   COLONOSCOPY WITH PROPOFOL N/A 12/08/2018   Procedure: COLONOSCOPY WITH PROPOFOL;  Surgeon: Toledo, Benay Pike, MD;  Location: [facility] ENDOSCOPY;  Service: Gastroenterology;  Laterality: N/A;   EYE SURGERY     Patient Active Problem List   Diagnosis Date Noted   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   Following inpt rehab, pt went home and had home health.   PRECAUTIONS: fall  SUBJECTIVE: Pt reports she is doing well, still using her cordless handwarmers prior to tasks due to Raynaud's.  PAIN:  Are you having pain? 0  OBJECTIVE:  L grip 6, R grip 41 L lateral pinch 5, R 13 L 3 point pinch 4, R 14  L 9 hole 1 min 43 sec. L shoulder active flexion 0-105, passive 0-125 02/05/22: L grip 13#  L lateral pinch 8#  3 point pinch 5#  L shoulder flex 0-108 03/12/22:  L grip: 12# L lateral pinch: 8# 3 point pinch: 6# L 9 hole: 1 min 3 sec L shoulder flexion 0-110 04/16/22: L grip: 3# L lateral pinch: 4# 3 point pinch: difficulty maintaining 3 point pinch  L 9 hole: 5 pegs in 2 min 36 sec  L shoulder flexion 0-107 BP L 132/79 HR 80 02 sats 96% 04/25/22: L grip: 14#; R grip: 50# L lateral 2#, R lateral 17# 3 point pinch: L 4#, R 14# (Saehan pinch gauge) 9 hole peg test: 1 min, 48 sec L shoulder flex 125 (R shoulder 132 active) 05/21/22: L grip: 15#; R grip: 50# L lateral 8#, R lateral 20# 3 point pinch: L 4#, R 12# (Saehan pinch gauge) 9 hole peg test: 1 min,  23 sec  TODAY'S TREATMENT: Therapeutic Exercise: Pt seen for UE strengthening with use of puttycise tools and turquoise resistance Norco putty.  Utilized knob turn, key turn, l bar, peg turn, cap turn.  Cues at times for prehension patterns.   Neuro re-ed: Pt seen for scatter pile of cards, searching and picking up cards from flat surface and putting them into a stack in ascending order with use of left hand.  Some difficulty with picking up the cards from a flat surface and occasionally needs to pull cards to the edge of the table.  Reports she had her nails cut shorter so its more difficult to pick up some things.   PATIENT EDUCATION: Education details: normalized reaching patterns Person educated: pt Education method: Explanation and Verbal cues, demo Education comprehension: verbalized understanding, returned demo  with tactile and vc   HOME EXERCISE PROGRAM Continue to engage LUE into ADLs; continue use of putty for gripping and pinching exercises for LUE, and L shoulder AROM/AAROM, crocheting, typing; increase participation in IADL tasks for greater use of L hand.    OT Long Term Goals - 06/06/22 (6 weeks)      OT LONG TERM GOAL #1   Title Pt will be independent with home exercise program.    Baseline Eval: no current program, 10th visit:  continue to add new exercises as pt progresses, 20th:  continue to update HEP; 08/17/21: continue to progress HEP when indicated.  Visit 40: adding new exercises as pt progresses; 12/25/21: ongoing with progressions; 03/12/22: inconsistent use of putty but regular use of stress ball (every other day).  Encouraged pt return to daily putty use (2x daily when able) to target grip and pinch strengthening; 04/16/22: not reviewed today d/t OT sent pt to ED; 04/25/22: added pulleys and pt has re-started putty; 05/21/22: stopped pulleys and added stronger theraputty and encouraged pt crochet often    Time 12    Period Weeks    Status On-going    Target Date 06/06/22     OT LONG TERM GOAL #2   Title Pt will complete UB and LB dressing with modified independence including buttons, snaps and zippers.    Baseline requires min assist at eval, 10th visit: occasional assist with buttons, 20th:  able to perform one handed, but difficulty with bilateral UE; 08/17/21: pt reports inconsistent with 1 hand, reviewed techniques this visit, Visit 40:  Pt requires assist with bra   Time 12    Period Weeks    Status MET   Target Date 11/08/21     OT LONG TERM GOAL #3   Title Pt will perform shower transfer with modified independence.    Baseline Pt requires supervision to min assist for shower transfer at home. 10th visit: supervision; 08/17/21: supv .  Visit 40:  Pt had met goal but had recent fall and now requiring min guard to supv   Time 6    Period Weeks    Status  Met    Target Date  11/08/21      OT LONG TERM GOAL #4   Title Pt will improve L hand grip to 20 or more #s to enable pt to open a new jar.   Baseline no grip in left hand at eval, 10th visit:  improved flexion but still working towards composite fisting and grip. 20th:  continues to demo decreased grip; 08/17/21: active digit flexion improving, but not yet able to register grip on dynamometer; 09/04/21: L grip 1# 7/17:  5#; 12/25/21: L grip 6#; 02/05/22: 13#; 03/12/22: L grip 12#; 1/23//24: L grip 3# (decreased); 04/25/22: L grip 14#; spouse assists to open new jars; 05/21/22: L grip 15#   Time 12    Period Weeks    Status Revised   Target Date 07/18/22     OT LONG TERM GOAL #5   Title Pt will improve left shoulder flexion to 100 degrees or better to improve reaching to obtain self care items from shelf/shoulder height.    Baseline difficulty with reach, shoulder flexion to 47 degrees; 08/17/21: L shoulder flexion 85, but not yet able to consistently hold ADL supplies in L hand when reaching; 09/04/21: flexion 85 7/17:  shoulder flexion to 90; 12/25/21: 105 (P 125); 03/12/22: active L shoulder flexion 110 (pt uses R arm to reach for items at shoulder height or above); 04/16/22: L shoulder flexion 107; 04/25/22: L shoulder flexion 125*, R 132*    Time 12    Period Weeks    Status achieved   Target Date 04/25/22     OT LONG TERM GOAL #6   Title Pt will improve FOTO score to 47 or above to demonstrate a clinically relevant change in function to impact ADL tasks.    Baseline score of 30 at eval; 08/17/21: FOTO: 42; 09/04/21: FOTO : 54 , FOTO 7/7:  60; 12/25/21: 59; 03/12/22: 66; 04/16/22: 67; 04/25/22; 51; 05/21/22: 74   Time 12    Period Weeks    Status  MET/ongoing    Target Date 04/25/22     OT LONG TERM GOAL #7   Title Pt will demonstrate ability to pick up small objects and complete 9 hole peg test in less than 1 min 30 sec (revised from under 2 min)   Baseline unable to perform at eval, 10th visit: still unable to pick up small  pegs, 20th: unable to pick up pegs but improving; 08/17/21: not attempted d/t time constraints, will assess next visit; 09/04/21: 9 hole peg test in 5 min 53 sec;  7/17:  Pt unable to complete this date but was able to place 6 of 9 pegs in 4 mins 20 secs; 12/25/21: L 1 min 43 sec; 03/12/22: L 1 min 2 sec (requires non-skid surface to pick up small items from table top); 04/16/22: 5 pegs in 2 min 36 secs; 04/25/22: 1 min 48 sec; 05/21/22: L 1 min 23 sec   Time 12    Period Weeks    Status Revised/On-going    Target Date 07/18/22    OT LONG TERM GOAL #8   Title Pt will increase LUE strength by 1 MM grade in order to hold blow dryer in LUE to dry hair without dropping dryer.   Baseline 04/25/22: Unable to sustain grip and maintain lift of LUE with hand near head without dropping dryer.  Dryer has caused bruising above L eye with previous attempts.  L shoulder flex/abd 3/5, elbow flex/ext 3+/5    Time 12    Period Weeks    Status New    Target Date 07/18/22        OT LONG TERM GOAL #9   Title Pt will increase L hand dexterity to enable pt to independently manage clothing fasteners.   Baseline 04/25/22: pt ties shoe laces loosely and requires assist from spouse occasionally.  When typing, pt engages the L hand for ~25% of the task.  Pt avoids wearing jeans d/t inability to manage button or zipper.  Time 12    Period Weeks    Status New    Target Date 07/18/22        OT LONG TERM GOAL #10   Title Pt will be able to independently carry a light plate of food or drink in L hand without spilling/dropping to increase efficiency with item transport in the kitchen.   Baseline 04/25/22: Pt requires constant cues to keep cup or plate level while multitasking in order to prevent spilling.   Time 12    Period Weeks    Status New    Target Date 07/18/22     Plan -     Clinical Impression Statement Pt has continued to progress well in all areas.  Her performance with hand function tasks improve when her hands  are warmed otherwise she demonstrates some difficulty with moving towards more precision tasks due to Raynaud's.  Pt responds well to cues for prehension patterns.  Continue to work towards goals in plan of care to improve left UE functional use in ADL and IADL tasks at home and in the community.    OT Occupational Profile and History Detailed Assessment- Review of Records and additional review of physical, cognitive, psychosocial history related to current functional performance    Occupational performance deficits (Please refer to evaluation for details): ADL's;IADL's;Leisure;Rest and Sleep    Body Structure / Function / Physical Skills ADL;Coordination;Endurance;GMC;UE functional use;Balance;IADL;Pain;Dexterity;FMC;Strength;Edema;Mobility;ROM    Psychosocial Skills Environmental  Adaptations;Habits;Routines and Behaviors    Rehab Potential Good    Clinical Decision Making Several treatment options, min-mod task modification necessary   Comorbidities Affecting Occupational Performance: May have comorbidities impacting occupational performance    Modification or Assistance to Complete Evaluation  Min-Moderate modification of tasks or assist with assess necessary to complete eval    OT Frequency 2x / week    OT Duration 12 weeks    OT Treatment/Interventions Self-care/ADL training;Cryotherapy;Paraffin;Therapeutic exercise;DME and/or AE instruction;Functional Mobility Training;Balance training;Electrical Stimulation;Ultrasound;Neuromuscular education;Manual Therapy;Splinting;Moist Heat;Contrast Bath;Passive range of motion;Therapeutic activities;Patient/family education;Coping strategies training    Plan OT recert    Consulted and Agree with Plan of Care Patient           Branna Cortina Oneita Jolly, OTR/L, CLT   Jermane Brayboy, OT 05/23/2022, 8:21 PM

## 2022-05-27 ENCOUNTER — Ambulatory Visit (INDEPENDENT_AMBULATORY_CARE_PROVIDER_SITE_OTHER): Payer: Medicare PPO

## 2022-05-27 DIAGNOSIS — I639 Cerebral infarction, unspecified: Secondary | ICD-10-CM

## 2022-05-27 NOTE — Therapy (Signed)
OUTPATIENT PHYSICAL THERAPY NEURO TREATMENT   Patient Name: Teresa [name] MRN: [id number] DOB:[birth year], 82 y.o., female Today's Date: 05/28/2022   PCP: Dr. [name] REFERRING PROVIDER: Dr. [name]  END OF SESSION:  PT End of Session - 05/28/22 1304     Visit Number 2    Number of Visits 24    Date for PT Re-Evaluation 08/15/22    Authorization Time Period 05/23/2022- 08/15/2022    Progress Note Due on Visit 10    PT Start Time [place] During Treatment Gait belt    Activity Tolerance Patient tolerated treatment well    Behavior During Therapy [facility] for tasks assessed/performed             Past Medical History:  Diagnosis Date   Cancer ([place])    skin   Hypothyroidism    Raynaud's disease    Stroke [facility])    Past Surgical History:  Procedure Laterality Date   [place]   BACK SURGERY  2010   Cervical fusion C4-5-6-7   CATARACT EXTRACTION, BILATERAL Bilateral 10/2016   CHOLECYSTECTOMY  1995   COLONOSCOPY WITH PROPOFOL N/A 12/08/2018   Procedure: COLONOSCOPY WITH PROPOFOL;  Surgeon: Toledo, Benay Pike, MD;  Location: [facility] ENDOSCOPY;  Service: Gastroenterology;  Laterality: N/A;   EYE SURGERY     Patient Active Problem List   Diagnosis Date Noted   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   Stroke ([place]) 02/27/2021   History of nonmelanoma skin cancer 05/23/2014    ONSET DATE: 04/16/2022  REFERRING DIAG: 69.354 (ICD-10-CM) - Hemiplegia and hemiparesis following cerebral infarction affecting left non-dominant side   THERAPY DIAG:  Muscle weakness (generalized)  Other lack of coordination  Right middle cerebral artery stroke ([facility])  Difficulty in walking, not elsewhere classified  Unsteadiness on feet  Rationale for Evaluation and Treatment: Rehabilitation  SUBJECTIVE:                                                                                                                                                                                              SUBJECTIVE STATEMENT: Patient reports feeling okay today - ready to work on her balance    Pt accompanied by: significant other  PERTINENT HISTORY: Pt is a 82 y.o. female with referral for hemiplegia with left sided weakness -original CVA on 02/26/21 and new onset of left sided weakness on 04/16/2022.  R MCA CVA on 02/26/21. Pt completed outpatient PT on 01/17/2022 with majority of goals met. PMH includes: skin cancer, hypothyroidism, and Raynaud's disease, history of cervical  fusion in 2010 C4-C7.   PAIN:  Are you having pain? No  PRECAUTIONS: Fall  WEIGHT BEARING RESTRICTIONS: No  FALLS: Has patient fallen in last 6 months? No  LIVING ENVIRONMENT: Lives with: lives with their spouse Lives in: House/apartment Stairs: Yes: External: 1 steps; none Has following equipment at home: Single point cane, Walker - 2 wheeled, shower chair, and Grab bars  PLOF: Independent  PATIENT GOALS: I want to walk without this cane and no falls and enjoy going out to grandkids sporting event  OBJECTIVE:   DIAGNOSTIC FINDINGS: CLINICAL DATA:  Stroke suspected   EXAM: MRI HEAD WITHOUT CONTRAST   TECHNIQUE: Multiplanar, multiecho pulse sequences of the brain and surrounding structures were obtained without intravenous contrast.   COMPARISON:  MRI Head 02/26/21, CT Head 04/16/22   FINDINGS: Brain: Evolving right MCA territory infarct involving the right frontal operculum in the right insular cortex. No acute infarction, hemorrhage, hydrocephalus, extra-axial collection or mass lesion. Small focus of microhemorrhage in the left temporal lobe. There is petechial hemorrhage and/or hemosiderosis in the region of prior right MCA territory infarct. T2/stir hyperintense signal in the right cerebral peduncle is favored to represent transneuronal degeneration.   Vascular: Major flow voids are preserved.   Skull and upper  cervical spine: Normal marrow signal.   Sinuses/Orbits: Bilateral lens replacement. No mastoid or middle ear effusion. Paranasal sinuses are clear.   Other: None.   IMPRESSION: 1. No acute intracranial abnormality. 2. 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 3.     Electronically Signed   By: Marin Roberts M.D.   On: 04/16/2022 13:41  COGNITION: Overall cognitive status: Within functional limits for tasks assessed   SENSATION: WFL  COORDINATION: Intact with each LE  EDEMA:  None observed  MUSCLE TONE: LLE: Within functional limits  DTRs:  Patella 2+ = Normal and Achilles 2+ = Normal  POSTURE: rounded shoulders and forward head  LOWER EXTREMITY ROM:     Active  Right Eval Left Eval  Hip flexion WNL THROUGHOUT LE WNL THROUGHOUT LE  Hip extension    Hip abduction    Hip adduction    Hip internal rotation    Hip external rotation    Knee flexion    Knee extension    Ankle dorsiflexion    Ankle plantarflexion    Ankle inversion    Ankle eversion     (Blank rows = not tested)  LOWER EXTREMITY MMT:    MMT Right Eval Left Eval  Hip flexion 4+ 4  Hip extension 4+ 4  Hip abduction 4+ 4  Hip adduction 4+ 4  Hip internal rotation 4+ 4  Hip external rotation 4+ 4  Knee flexion 4+ 4  Knee extension 5 4  Ankle dorsiflexion 5 4  Ankle plantarflexion    Ankle inversion    Ankle eversion    (Blank rows = not tested)  BED MOBILITY:  Patient reports independent with all bed mobility and states has returned to master bed  TRANSFERS: Assistive device utilized: None  Sit to stand: Complete Independence Stand to sit: Complete Independence Chair to chair: Complete Independence Floor:  Not tested   GAIT: Gait pattern: step through pattern, decreased arm swing- Right, decreased arm swing- Left, decreased step length- Right, and decreased step length- Left Distance walked: 915 feet Assistive device  utilized: None Level of assistance: SBA Comments: occasional scuff or decreased left foot clearance  FUNCTIONAL TESTS:  5 times sit  to stand: 23.62 sec without UE support Timed up and go (TUG): 20.14 sec without an AD 10 meter walk test: 0.70 m/s  PATIENT SURVEYS:  FOTO 60/ goal of 66  TODAY'S TREATMENT:                                                                                                                              DATE: 05/28/2022  NMR:   Step high knee march on airex pad without UE support - x 20 reps each LE Standing hip abd on airex pad without UE support x 20 reps each LE  Dynamic kicking soccer ball against wall  Dynamic standing at support bar - varying foot position while performing dual task activities  -alphabet - feet apart, Feet narrowed, staggered, tandem  -alphabet- on airex pad  Dynamic lateral step over 1/2 foam x12 reps without UE support   PATIENT EDUCATION: Education details: PT plan of care Person educated: Patient Education method: Explanation, Demonstration, Tactile cues, Verbal cues, and Handouts Education comprehension: verbalized understanding, returned demonstration, verbal cues required, tactile cues required, and needs further education  HOME EXERCISE PROGRAM: To be updated next 1-2 visit  GOALS: Goals reviewed with patient? Yes  SHORT TERM GOALS: Target date: 07/04/2022  Patient will be independent in home exercise program to improve strength/mobility for better functional independence with ADLs  Baseline: EVAL: Patient reports mostly just walking  Goal status: INITIAL  LONG TERM GOALS: Target date: 08/14/2021  Pt will decrease 5TSTS by at least 6 seconds in order to demonstrate clinically significant improvement in LE strength. Baseline: EVAL= 23.62 sec without UE  Goal status: INITIAL  2. Pt will improve FOTO to target score of 66 to display perceived improvements in ability to complete ADL's.  Baseline: Eval= 60 Goal status:  INITIAL  3.  Pt will decrease TUG to below 14 seconds/decrease in order to demonstrate decreased fall risk. Baseline: Eval= 20.14 sec without AD Goal status: INITIAL  4.  Patient will increase six minute walk test distance to >1200 ft for improve gait ability and return to PLOF to complete her walks in her neighborhood.  Baseline: EVAL= 915 feet without an AD Goal status: INITIAL 5. Pt will increase 10MWT by at least 0.13 m/s in order to demonstrate clinically significant improvement in community ambulation.    Baseline: Eval = 0.70 m/s  Goal status: INITIAL  ASSESSMENT:  CLINICAL IMPRESSION: Treatment focused on dynamic balance today with good results - patient improved overall with varying foot positions and dual tasks activities. She was able to maintain her balance while using her left affected arm with overhead activities.  She will benefit from skilled PT services to improve her overall left LE muscle strength and improve her mobility to decrease her fall risk and improve her overall quality of life.   OBJECTIVE IMPAIRMENTS: Abnormal gait, decreased activity tolerance, decreased balance, decreased coordination, decreased endurance, decreased mobility, difficulty walking, and decreased strength.   ACTIVITY  LIMITATIONS: carrying, lifting, bending, standing, squatting, stairs, and transfers  PARTICIPATION LIMITATIONS: cleaning, laundry, driving, shopping, community activity, and yard work  PERSONAL FACTORS: 3+ comorbidities: OA, HTN, hx of previous CVA  are also affecting patient's functional outcome.   REHAB POTENTIAL: Good  CLINICAL DECISION MAKING: Evolving/moderate complexity  EVALUATION COMPLEXITY: Moderate  PLAN:  PT FREQUENCY: 1-2x/week  PT DURATION: 12 weeks  PLANNED INTERVENTIONS: Therapeutic exercises, Therapeutic activity, Neuromuscular re-education, Balance training, Gait training, Patient/Family education, Self Care, Joint mobilization, Joint manipulation, Stair  training, Vestibular training, Canalith repositioning, DME instructions, Dry Needling, Electrical stimulation, Spinal manipulation, Spinal mobilization, Cryotherapy, Moist heat, Taping, and Manual therapy  PLAN FOR NEXT SESSION: Perform balance testing and add goal as appropriate. LE strengthening and add to HEP as appropriate next session.   Lewis Moccasin, PT 05/28/2022, 1:04 PM

## 2022-05-28 ENCOUNTER — Ambulatory Visit: Payer: Medicare PPO

## 2022-05-28 ENCOUNTER — Ambulatory Visit: Payer: Medicare PPO | Attending: Internal Medicine

## 2022-05-28 DIAGNOSIS — R2681 Unsteadiness on feet: Secondary | ICD-10-CM

## 2022-05-28 DIAGNOSIS — M6281 Muscle weakness (generalized): Secondary | ICD-10-CM

## 2022-05-28 DIAGNOSIS — R262 Difficulty in walking, not elsewhere classified: Secondary | ICD-10-CM

## 2022-05-28 DIAGNOSIS — I63511 Cerebral infarction due to unspecified occlusion or stenosis of right middle cerebral artery: Secondary | ICD-10-CM | POA: Diagnosis present

## 2022-05-28 DIAGNOSIS — R278 Other lack of coordination: Secondary | ICD-10-CM | POA: Insufficient documentation

## 2022-05-28 LAB — CUP PACEART REMOTE DEVICE CHECK
Date Time Interrogation Session: 20240303231500
Implantable Pulse Generator Implant Date: 19440924

## 2022-05-29 NOTE — Therapy (Signed)
OUTPATIENT OCCUPATIONAL THERAPY NEURO TREATMENT NOTE           Patient Name: Teresa [name] MRN: [id number] DOB:[birth year], 82 y.o., female Today's Date: 05/23/2022   PCP: Dr. [name] REFERRING PROVIDER: Dr. [name]   OT End of Session - 05/29/22 1402     Visit Number 102    Number of Visits 117    Date for OT Re-Evaluation 07/18/22    Authorization Time Period Reporting period beginning 05/21/22    Progress Note Due on Visit 10    OT Start Time 1145    OT Stop Time 1230    OT Time Calculation (min) 45 min    Equipment Utilized During Treatment cane    Activity Tolerance Patient tolerated treatment well    Behavior During Therapy WFL for tasks assessed/performed             Past Medical History:  Diagnosis Date   Cancer ([place])    skin   Hypothyroidism    Raynaud's disease    Stroke [facility])    Past Surgical History:  Procedure Laterality Date   [place]  2010   Cervical fusion C4-5-6-7   CATARACT EXTRACTION, BILATERAL Bilateral 10/2016   CHOLECYSTECTOMY  1995   COLONOSCOPY WITH PROPOFOL N/A 12/08/2018   Procedure: COLONOSCOPY WITH PROPOFOL;  Surgeon: Toledo, Benay Pike, MD;  Location: [facility] ENDOSCOPY;  Service: Gastroenterology;  Laterality: N/A;   EYE SURGERY     Patient Active Problem List   Diagnosis Date Noted   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   Stroke ([place]) 02/27/2021   History of nonmelanoma skin cancer 05/23/2014   ONSET DATE: 02/26/2021  REFERRING DIAG: R MCA CVA  THERAPY DIAG:  Muscle weakness (generalized)  Other lack of coordination  Right middle cerebral artery stroke [facility])  Rationale for Evaluation and Treatment Rehabilitation  PERTINENT HISTORY: February 26, 2021, pt reports she had a CVA, came to [facility] to ER and then was transferred to [facility] in [place] where she was admitted and after acute care she went to inpatient rehab.   Following inpt rehab, pt went home and had home health.   PRECAUTIONS: fall  SUBJECTIVE: Pt reports she started a few min at a time of crocheting since last seen by OT. PAIN:  Are you having pain? 0  OBJECTIVE:  L grip 6, R grip 41 L lateral pinch 5, R 13 L 3 point pinch 4, R 14  L 9 hole 1 min 43 sec. L shoulder active flexion 0-105, passive 0-125 02/05/22: L grip 13#  L lateral pinch 8#  3 point pinch 5#  L shoulder flex 0-108 03/12/22:  L grip: 12# L lateral pinch: 8# 3 point pinch: 6# L 9 hole: 1 min 3 sec L shoulder flexion 0-110 04/16/22: L grip: 3# L lateral pinch: 4# 3 point pinch: difficulty maintaining 3 point pinch  L 9 hole: 5 pegs in 2 min 36 sec  L shoulder flexion 0-107 BP L 132/79 HR 80 02 sats 96% 04/25/22: L grip: 14#; R grip: 50# L lateral 2#, R lateral 17# 3 point pinch: L 4#, R 14# (Saehan pinch gauge) 9 hole peg test: 1 min, 48 sec L shoulder flex 125 (R shoulder 132 active) 05/21/22: L grip: 15#; R grip: 50# L lateral 8#, R lateral 20# 3 point pinch: L 4#, R 12# (Saehan pinch gauge) 9 hole peg test: 1 min, 23  sec  TODAY'S TREATMENT: Therapeutic Exercise: Facilitated hand strengthening with use of hand gripper set at 6.6# to remove jumbo pegs from pegboard x2 trials using L hand.  Peg container placed at various points on table top to facilitate forward and lateral reaching with outstretched arm during set up of pegs into pegboard.  Pt required 1 rest break in the middle of and in between each trial, improved from last session.   Neuro re-ed: Facilitated L FMC/dexterity skills working to pick up small pegs from tray and place into pegboard.  Tray placed at various points on table top to facilitate forward and lateral reaching with outstretched arm during peg placement and discarding.   PATIENT EDUCATION: Education details: normalized reaching patterns Person educated: pt Education method: Explanation and Verbal cues, demo Education  comprehension: verbalized understanding, returned demo with tactile and vc   HOME EXERCISE PROGRAM Continue to engage LUE into ADLs; continue use of putty for gripping and pinching exercises for LUE, and L shoulder AROM/AAROM, crocheting, typing; increase participation in IADL tasks for greater use of L hand.    OT Long Term Goals - 06/06/22 (6 weeks)      OT LONG TERM GOAL #1   Title Pt will be independent with home exercise program.    Baseline Eval: no current program, 10th visit:  continue to add new exercises as pt progresses, 20th:  continue to update HEP; 08/17/21: continue to progress HEP when indicated.  Visit 40: adding new exercises as pt progresses; 12/25/21: ongoing with progressions; 03/12/22: inconsistent use of putty but regular use of stress ball (every other day).  Encouraged pt return to daily putty use (2x daily when able) to target grip and pinch strengthening; 04/16/22: not reviewed today d/t OT sent pt to ED; 04/25/22: added pulleys and pt has re-started putty; 05/21/22: stopped pulleys and added stronger theraputty and encouraged pt crochet often    Time 12    Period Weeks    Status On-going    Target Date 06/06/22     OT LONG TERM GOAL #2   Title Pt will complete UB and LB dressing with modified independence including buttons, snaps and zippers.    Baseline requires min assist at eval, 10th visit: occasional assist with buttons, 20th:  able to perform one handed, but difficulty with bilateral UE; 08/17/21: pt reports inconsistent with 1 hand, reviewed techniques this visit, Visit 40:  Pt requires assist with bra   Time 12    Period Weeks    Status MET   Target Date 11/08/21     OT LONG TERM GOAL #3   Title Pt will perform shower transfer with modified independence.    Baseline Pt requires supervision to min assist for shower transfer at home. 10th visit: supervision; 08/17/21: supv .  Visit 40:  Pt had met goal but had recent fall and now requiring min guard to supv   Time  6    Period Weeks    Status  Met    Target Date 11/08/21      OT LONG TERM GOAL #4   Title Pt will improve L hand grip to 20 or more #s to enable pt to open a new jar.   Baseline no grip in left hand at eval, 10th visit:  improved flexion but still working towards composite fisting and grip. 20th:  continues to demo decreased grip; 08/17/21: active digit flexion improving, but not yet able to register grip on dynamometer; 09/04/21: L grip 1# 7/17:  5#; 12/25/21: L grip 6#; 02/05/22: 13#; 03/12/22: L grip 12#; 1/23//24: L grip 3# (decreased); 04/25/22: L grip 14#; spouse assists to open new jars; 05/21/22: L grip 15#   Time 12    Period Weeks    Status Revised   Target Date 07/18/22     OT LONG TERM GOAL #5   Title Pt will improve left shoulder flexion to 100 degrees or better to improve reaching to obtain self care items from shelf/shoulder height.    Baseline difficulty with reach, shoulder flexion to 47 degrees; 08/17/21: L shoulder flexion 85, but not yet able to consistently hold ADL supplies in L hand when reaching; 09/04/21: flexion 85 7/17:  shoulder flexion to 90; 12/25/21: 105 (P 125); 03/12/22: active L shoulder flexion 110 (pt uses R arm to reach for items at shoulder height or above); 04/16/22: L shoulder flexion 107; 04/25/22: L shoulder flexion 125*, R 132*    Time 12    Period Weeks    Status achieved   Target Date 04/25/22     OT LONG TERM GOAL #6   Title Pt will improve FOTO score to 47 or above to demonstrate a clinically relevant change in function to impact ADL tasks.    Baseline score of 30 at eval; 08/17/21: FOTO: 42; 09/04/21: FOTO : 54 , FOTO 7/7:  60; 12/25/21: 59; 03/12/22: 66; 04/16/22: 67; 04/25/22; 51; 05/21/22: 74   Time 12    Period Weeks    Status  MET/ongoing    Target Date 04/25/22     OT LONG TERM GOAL #7   Title Pt will demonstrate ability to pick up small objects and complete 9 hole peg test in less than 1 min 30 sec (revised from under 2 min)   Baseline unable to perform  at eval, 10th visit: still unable to pick up small pegs, 20th: unable to pick up pegs but improving; 08/17/21: not attempted d/t time constraints, will assess next visit; 09/04/21: 9 hole peg test in 5 min 53 sec;  7/17:  Pt unable to complete this date but was able to place 6 of 9 pegs in 4 mins 20 secs; 12/25/21: L 1 min 43 sec; 03/12/22: L 1 min 2 sec (requires non-skid surface to pick up small items from table top); 04/16/22: 5 pegs in 2 min 36 secs; 04/25/22: 1 min 48 sec; 05/21/22: L 1 min 23 sec   Time 12    Period Weeks    Status Revised/On-going    Target Date 07/18/22    OT LONG TERM GOAL #8   Title Pt will increase LUE strength by 1 MM grade in order to hold blow dryer in LUE to dry hair without dropping dryer.   Baseline 04/25/22: Unable to sustain grip and maintain lift of LUE with hand near head without dropping dryer.  Dryer has caused bruising above L eye with previous attempts.  L shoulder flex/abd 3/5, elbow flex/ext 3+/5    Time 12    Period Weeks    Status New    Target Date 07/18/22        OT LONG TERM GOAL #9   Title Pt will increase L hand dexterity to enable pt to independently manage clothing fasteners.   Baseline 04/25/22: pt ties shoe laces loosely and requires assist from spouse occasionally.  When typing, pt engages the L hand for ~25% of the task.  Pt avoids wearing jeans d/t inability to manage button or zipper.  Time 12    Period Weeks    Status New    Target Date 07/18/22        OT LONG TERM GOAL #10   Title Pt will be able to independently carry a light plate of food or drink in L hand without spilling/dropping to increase efficiency with item transport in the kitchen.   Baseline 04/25/22: Pt requires constant cues to keep cup or plate level while multitasking in order to prevent spilling.   Time 12    Period Weeks    Status New    Target Date 07/18/22     Plan -     Clinical Impression Statement Focused on GMC/FMC tasks working to reach for large and small  pegs placed at various points on table top to facilitate forward and lateral reaching with outstretched arm during set up of pegs into pegboards.  Intermittent min vc to extend elbow and limit forward leaning when using LUE to reach toward a target.  Pt required 1 rest break in the middle of and in between each trial with the hand gripper, which is improved from previous trials.  Pt typically requires 2-3 rest breaks to complete a trial d/t multiple dropped pegs from hand fatigue.  Pt stated that she did start to work on her crocheting at home, but only a few min at a time as it is frustrating and time consuming to engage the L hand.  Continue to work towards goals in plan of care to improve left UE functional use in ADL and IADL tasks at home and in the community.    OT Occupational Profile and History Detailed Assessment- Review of Records and additional review of physical, cognitive, psychosocial history related to current functional performance    Occupational performance deficits (Please refer to evaluation for details): ADL's;IADL's;Leisure;Rest and Sleep    Body Structure / Function / Physical Skills ADL;Coordination;Endurance;GMC;UE functional use;Balance;IADL;Pain;Dexterity;FMC;Strength;Edema;Mobility;ROM    Psychosocial Skills Environmental  Adaptations;Habits;Routines and Behaviors    Rehab Potential Good    Clinical Decision Making Several treatment options, min-mod task modification necessary   Comorbidities Affecting Occupational Performance: May have comorbidities impacting occupational performance    Modification or Assistance to Complete Evaluation  Min-Moderate modification of tasks or assist with assess necessary to complete eval    OT Frequency 2x / week    OT Duration 12 weeks    OT Treatment/Interventions Self-care/ADL training;Cryotherapy;Paraffin;Therapeutic exercise;DME and/or AE instruction;Functional Mobility Training;Balance training;Electrical  Stimulation;Ultrasound;Neuromuscular education;Manual Therapy;Splinting;Moist Heat;Contrast Bath;Passive range of motion;Therapeutic activities;Patient/family education;Coping strategies training    Plan OT recert    Consulted and Agree with Plan of Care Patient           Leta Speller, MS, OTR/L    Darleene Cleaver, OT 05/29/2022, 2:05 PM

## 2022-05-30 ENCOUNTER — Ambulatory Visit: Payer: Medicare PPO

## 2022-05-31 ENCOUNTER — Ambulatory Visit: Payer: Medicare PPO

## 2022-05-31 DIAGNOSIS — I63511 Cerebral infarction due to unspecified occlusion or stenosis of right middle cerebral artery: Secondary | ICD-10-CM

## 2022-05-31 DIAGNOSIS — M6281 Muscle weakness (generalized): Secondary | ICD-10-CM

## 2022-05-31 DIAGNOSIS — R278 Other lack of coordination: Secondary | ICD-10-CM

## 2022-05-31 DIAGNOSIS — R2681 Unsteadiness on feet: Secondary | ICD-10-CM

## 2022-05-31 NOTE — Therapy (Signed)
OUTPATIENT PHYSICAL THERAPY NEURO TREATMENT   Patient Name: Teresa [name] MRN: [id number] DOB:06-30-1940, 82 y.o., female Today's Date: 05/31/2022   PCP: Dr. [name] REFERRING PROVIDER: Dr. [name]  END OF SESSION:  PT End of Session - 05/31/22 0923     Visit Number 3    Number of Visits 24    Date for PT Re-Evaluation 08/15/22    Authorization Time Period 05/23/2022- 08/15/2022    Progress Note Due on Visit 10    PT Start Time 0930    PT Stop Time 1015    PT Time Calculation (min) 45 min    Equipment Utilized During Treatment Gait belt    Activity Tolerance Patient tolerated treatment well    Behavior During Therapy WFL for tasks assessed/performed             Past Medical History:  Diagnosis Date   Cancer ([place])    skin   Hypothyroidism    Raynaud's disease    Stroke [facility])    Past Surgical History:  Procedure Laterality Date   [place] SURGERY  2010   Cervical fusion C4-5-6-7   CATARACT EXTRACTION, BILATERAL Bilateral 10/2016   CHOLECYSTECTOMY  1995   COLONOSCOPY WITH PROPOFOL N/A 12/08/2018   Procedure: COLONOSCOPY WITH PROPOFOL;  Surgeon: Toledo, Benay Pike, MD;  Location: [facility] ENDOSCOPY;  Service: Gastroenterology;  Laterality: N/A;   EYE SURGERY     Patient Active Problem List   Diagnosis Date Noted   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   Stroke ([place]) 02/27/2021   History of nonmelanoma skin cancer 05/23/2014    ONSET DATE: 04/16/2022  REFERRING DIAG: 69.354 (ICD-10-CM) - Hemiplegia and hemiparesis following cerebral infarction affecting left non-dominant side   THERAPY DIAG:  Muscle weakness (generalized)  Other lack of coordination  Right middle cerebral artery stroke ([facility])  Unsteadiness on feet  Rationale for Evaluation and Treatment: Rehabilitation  SUBJECTIVE:                                                                                                                                                                                              SUBJECTIVE STATEMENT: Pt reports no falls since last session. Reports shoulder pain/soreness from alphabet exercise. No shoulder pain today.    Pt accompanied by: significant other  PERTINENT HISTORY: Pt is a 82 y.o. female with referral for hemiplegia with left sided weakness -original CVA on 02/26/21 and new onset of left sided weakness on 04/16/2022.  R MCA CVA on 02/26/21. Pt completed outpatient PT on 01/17/2022 with majority  of goals met. PMH includes: skin cancer, hypothyroidism, and Raynaud's disease, history of cervical fusion in 2010 C4-C7.   PAIN:  Are you having pain? No  PRECAUTIONS: Fall  WEIGHT BEARING RESTRICTIONS: No  FALLS: Has patient fallen in last 6 months? No  LIVING ENVIRONMENT: Lives with: lives with their spouse Lives in: House/apartment Stairs: Yes: External: 1 steps; none Has following equipment at home: Single point cane, Walker - 2 wheeled, shower chair, and Grab bars  PLOF: Independent  PATIENT GOALS: I want to walk without this cane and no falls and enjoy going out to grandkids sporting event  OBJECTIVE:   DIAGNOSTIC FINDINGS: CLINICAL DATA:  Stroke suspected   EXAM: MRI HEAD WITHOUT CONTRAST   TECHNIQUE: Multiplanar, multiecho pulse sequences of the brain and surrounding structures were obtained without intravenous contrast.   COMPARISON:  MRI Head 02/26/21, CT Head 04/16/22   FINDINGS: Brain: Evolving right MCA territory infarct involving the right frontal operculum in the right insular cortex. No acute infarction, hemorrhage, hydrocephalus, extra-axial collection or mass lesion. Small focus of microhemorrhage in the left temporal lobe. There is petechial hemorrhage and/or hemosiderosis in the region of prior right MCA territory infarct. T2/stir hyperintense signal in the right cerebral peduncle is favored to represent transneuronal degeneration.    Vascular: Major flow voids are preserved.   Skull and upper cervical spine: Normal marrow signal.   Sinuses/Orbits: Bilateral lens replacement. No mastoid or middle ear effusion. Paranasal sinuses are clear.   Other: None.   IMPRESSION: 1. No acute intracranial abnormality. 2. 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 3.     Electronically Signed   By: Marin Roberts M.D.   On: 04/16/2022 13:41  COGNITION: Overall cognitive status: Within functional limits for tasks assessed   SENSATION: WFL  COORDINATION: Intact with each LE  EDEMA:  None observed  MUSCLE TONE: LLE: Within functional limits  DTRs:  Patella 2+ = Normal and Achilles 2+ = Normal  POSTURE: rounded shoulders and forward head  LOWER EXTREMITY ROM:     Active  Right Eval Left Eval  Hip flexion WNL THROUGHOUT LE WNL THROUGHOUT LE  Hip extension    Hip abduction    Hip adduction    Hip internal rotation    Hip external rotation    Knee flexion    Knee extension    Ankle dorsiflexion    Ankle plantarflexion    Ankle inversion    Ankle eversion     (Blank rows = not tested)  LOWER EXTREMITY MMT:    MMT Right Eval Left Eval  Hip flexion 4+ 4  Hip extension 4+ 4  Hip abduction 4+ 4  Hip adduction 4+ 4  Hip internal rotation 4+ 4  Hip external rotation 4+ 4  Knee flexion 4+ 4  Knee extension 5 4  Ankle dorsiflexion 5 4  Ankle plantarflexion    Ankle inversion    Ankle eversion    (Blank rows = not tested)  BED MOBILITY:  Patient reports independent with all bed mobility and states has returned to master bed  TRANSFERS: Assistive device utilized: None  Sit to stand: Complete Independence Stand to sit: Complete Independence Chair to chair: Complete Independence Floor:  Not tested   GAIT: Gait pattern: step through pattern, decreased arm swing- Right, decreased arm swing- Left, decreased step length- Right, and  decreased step length- Left Distance walked: 915 feet Assistive device utilized: None Level of assistance: SBA Comments:  occasional scuff or decreased left foot clearance  FUNCTIONAL TESTS:  5 times sit to stand: 23.62 sec without UE support Timed up and go (TUG): 20.14 sec without an AD 10 meter walk test: 0.70 m/s  PATIENT SURVEYS:  FOTO 60/ goal of 66  TODAY'S TREATMENT:                                                                                                                              DATE: 05/31/2022  Neuro Re-Ed:   STS with airex pad under RLE: 1x8, SBA reporting as "easy". 2x8 with 2KG med ball raises overhead to challenge AP/PA ankle righting reaction and LUE strength.  CGA.   Airex pad alternating marches without UE support: 2x20/LE, CGA. VC's for maintaining feet on airex pad.     Obstacle course: ambulating over unstable surface (red mat with AW's and hedgehog balls underneath) --> x2 hurdle step overs -> 6" step up and over + airex pad --> x6 alternating cone taps: x2 laps forwards   Obstacle course: x1 R lateral, x1 L lateral ambulating over unstable surface (red mat with AW's and hedgehog balls underneath) --> x2 hurdle step overs -> 6" step up and over with airex pad on top --> x6 cones weaves. Min to mod VC's for maintaining frontal plane and for cone weaves   Resisted walking at matrix: 7.5 lbs, x4 laps forwards/backwards/R lateral stepping/L lateral stepping, CGA. Frequent stepping strategy to correct with eccentric portions. Evident difficulty with R lateral side steps concentrically and eccentrically due to LLE weakness. CGA. PATIENT EDUCATION: Education details: PT plan of care Person educated: Patient Education method: Explanation, Demonstration, Tactile cues, Verbal cues, and Handouts Education comprehension: verbalized understanding, returned demonstration, verbal cues required, tactile cues required, and needs further education  HOME EXERCISE  PROGRAM: To be updated next 1-2 visit  GOALS: Goals reviewed with patient? Yes  SHORT TERM GOALS: Target date: 07/04/2022  Patient will be independent in home exercise program to improve strength/mobility for better functional independence with ADLs  Baseline: EVAL: Patient reports mostly just walking  Goal status: INITIAL  LONG TERM GOALS: Target date: 08/14/2021  Pt will decrease 5TSTS by at least 6 seconds in order to demonstrate clinically significant improvement in LE strength. Baseline: EVAL= 23.62 sec without UE  Goal status: INITIAL  2. Pt will improve FOTO to target score of 66 to display perceived improvements in ability to complete ADL's.  Baseline: Eval= 60 Goal status: INITIAL  3.  Pt will decrease TUG to below 14 seconds/decrease in order to demonstrate decreased fall risk. Baseline: Eval= 20.14 sec without AD Goal status: INITIAL  4.  Patient will increase six minute walk test distance to >1200 ft for improve gait ability and return to PLOF to complete her walks in her neighborhood.  Baseline: EVAL= 915 feet without an AD Goal status: INITIAL 5. Pt will increase 10MWT by at least 0.13 m/s in order to demonstrate clinically significant  improvement in community ambulation.    Baseline: Eval = 0.70 m/s  Goal status: INITIAL  ASSESSMENT:  CLINICAL IMPRESSION: Continuing PT POC with focus on dynamic balance/strengthening. Pt presents with deficits with SLS on LLE and concentric/eccentric control with side stepping on LLE due to weakness. Pt also demonstrating mild balance deficits ambulating on and maintaining balance on unstable surfaces with frequent ankle/hip righting to correct. Pt relies on stepping strategy to correct LOB but does not need physical support. These deficits place pt at increased risk for falls in community. Pt will benefit from skilled PT services to improve her overall left LE muscle strength and improve her mobility to decrease her fall risk and  improve her overall quality of life.   OBJECTIVE IMPAIRMENTS: Abnormal gait, decreased activity tolerance, decreased balance, decreased coordination, decreased endurance, decreased mobility, difficulty walking, and decreased strength.   ACTIVITY LIMITATIONS: carrying, lifting, bending, standing, squatting, stairs, and transfers  PARTICIPATION LIMITATIONS: cleaning, laundry, driving, shopping, community activity, and yard work  PERSONAL FACTORS: 3+ comorbidities: OA, HTN, hx of previous CVA  are also affecting patient's functional outcome.   REHAB POTENTIAL: Good  CLINICAL DECISION MAKING: Evolving/moderate complexity  EVALUATION COMPLEXITY: Moderate  PLAN:  PT FREQUENCY: 1-2x/week  PT DURATION: 12 weeks  PLANNED INTERVENTIONS: Therapeutic exercises, Therapeutic activity, Neuromuscular re-education, Balance training, Gait training, Patient/Family education, Self Care, Joint mobilization, Joint manipulation, Stair training, Vestibular training, Canalith repositioning, DME instructions, Dry Needling, Electrical stimulation, Spinal manipulation, Spinal mobilization, Cryotherapy, Moist heat, Taping, and Manual therapy  PLAN FOR NEXT SESSION: Perform balance testing and add goal as appropriate. LE strengthening and add to HEP as appropriate next session.   Salem Caster. Fairly IV, PT, DPT Physical Therapist- [facility]  05/31/2022, 11:22 AM

## 2022-06-02 NOTE — Therapy (Signed)
OUTPATIENT OCCUPATIONAL THERAPY NEURO TREATMENT NOTE           Patient Name: Teresa [name] MRN: [id number] DOB:1940/09/02, 82 y.o., female Today's Date: 05/23/2022   PCP: Dr. [name] REFERRING PROVIDER: Dr. [name]   OT End of Session - 06/02/22 1845     Visit Number 103    Number of Visits 117    Date for OT Re-Evaluation 07/18/22    Authorization Time Period Reporting period beginning 05/21/22    Progress Note Due on Visit 10    OT Start Time 1015    OT Stop Time 1100    OT Time Calculation (min) 45 min    Equipment Utilized During Treatment cane    Activity Tolerance Patient tolerated treatment well    Behavior During Therapy WFL for tasks assessed/performed             Past Medical History:  Diagnosis Date   Cancer ([place])    skin   Hypothyroidism    Raynaud's disease    Stroke [facility])    Past Surgical History:  Procedure Laterality Date   [place]  2010   Cervical fusion C4-5-6-7   CATARACT EXTRACTION, BILATERAL Bilateral 10/2016   CHOLECYSTECTOMY  1995   COLONOSCOPY WITH PROPOFOL N/A 12/08/2018   Procedure: COLONOSCOPY WITH PROPOFOL;  Surgeon: Toledo, Benay Pike, MD;  Location: [facility] ENDOSCOPY;  Service: Gastroenterology;  Laterality: N/A;   EYE SURGERY     Patient Active Problem List   Diagnosis Date Noted   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 in [place] where she was admitted and after acute care she went to inpatient rehab.   Following inpt rehab, pt went home and had home health.   PRECAUTIONS: fall  SUBJECTIVE: Pt reports she leaves for the beach tomorrow with her family to watch her granddaughter's softball tournament.  Pt will be gone a week with her family and will return for therapy as scheduled the following week.   PAIN:  Are you having pain? 0  OBJECTIVE:  L grip 6, R grip 41 L lateral pinch 5, R 13 L 3 point pinch 4, R 14  L 9 hole 1 min 43 sec. L shoulder active flexion 0-105, passive 0-125 02/05/22: L grip 13#  L lateral pinch 8#  3 point pinch 5#  L shoulder flex 0-108 03/12/22:  L grip: 12# L lateral pinch: 8# 3 point pinch: 6# L 9 hole: 1 min 3 sec L shoulder flexion 0-110 04/16/22: L grip: 3# L lateral pinch: 4# 3 point pinch: difficulty maintaining 3 point pinch  L 9 hole: 5 pegs in 2 min 36 sec  L shoulder flexion 0-107 BP L 132/79 HR 80 02 sats 96% 04/25/22: L grip: 14#; R grip: 50# L lateral 2#, R lateral 17# 3 point pinch: L 4#, R 14# (Saehan pinch gauge) 9 hole peg test: 1 min, 48 sec L shoulder flex 125 (R shoulder 132 active) 05/21/22: L grip: 15#; R grip: 50# L  lateral 8#, R lateral 20# 3 point pinch: L 4#, R 12# (Saehan pinch gauge) 9 hole peg test: 1 min, 23 sec  TODAY'S TREATMENT: Therapeutic Exercise: Facilitated L grip strengthening with hand gripper set at moderate resistance with 1 green band to complete 3 sets 10 reps.  Rest between sets and cues for technique to maximize hand closure with each squeeze, intermittent min A to adjust grip to prevent gripper slipping from hand.  Facilitated L forearm, wrist, and hand strengthening with participation in [place] board tools.  Pt worked with long handled tool to facilitate L wrist flex/ext, large base key turn, large dial turn, and small dial to facilitate L hand digit flex/ext x2 reps for each tool (up/down board=1 rep).  Rest breaks between sets and min vc for technique to maximize ROM with each tool rotation against  wide strip of velcro resistance; intermittent min A to keep tools level on board.    Neuro re-ed: Facilitated L FMC/dexterity skills working to pick up ball pegs from towel (non-skid surface) and place into pegboard.  Min vc to reposition pegs within fingertips vs setting them back down to re-grip.   PATIENT EDUCATION: Education details: normalized reaching patterns Person educated: pt Education method: Explanation and Verbal cues, demo Education comprehension: verbalized understanding, returned demo with tactile and vc   HOME EXERCISE PROGRAM Continue to engage LUE into ADLs; continue use of putty for gripping and pinching exercises for LUE, and L shoulder AROM/AAROM, crocheting, typing; increase participation in IADL tasks for greater use of L hand.    OT Long Term Goals - 06/06/22 (6 weeks)      OT LONG TERM GOAL #1   Title Pt will be independent with home exercise program.    Baseline Eval: no current program, 10th visit:  continue to add new exercises as pt progresses, 20th:  continue to update HEP; 08/17/21: continue to progress HEP when indicated.  Visit 40: adding new exercises as pt progresses; 12/25/21: ongoing with progressions; 03/12/22: inconsistent use of putty but regular use of stress ball (every other day).  Encouraged pt return to daily putty use (2x daily when able) to target grip and pinch strengthening; 04/16/22: not reviewed today d/t OT sent pt to ED; 04/25/22: added pulleys and pt has re-started putty; 05/21/22: stopped pulleys and added stronger theraputty and encouraged pt crochet often    Time 12    Period Weeks    Status On-going    Target Date 06/06/22     OT LONG TERM GOAL #2   Title Pt will complete UB and LB dressing with modified independence including buttons, snaps and zippers.    Baseline requires min assist at eval, 10th visit: occasional assist with buttons, 20th:  able to perform one handed, but difficulty with bilateral UE; 08/17/21: pt reports inconsistent  with 1 hand, reviewed techniques this visit, Visit 40:  Pt requires assist with bra   Time 12    Period Weeks    Status MET   Target Date 11/08/21     OT LONG TERM GOAL #3   Title Pt will perform shower transfer with modified independence.    Baseline Pt requires supervision to min assist for shower transfer at home. 10th visit: supervision; 08/17/21: supv .  Visit 40:  Pt had met goal but had recent fall and now requiring min guard to supv   Time 6    Period Weeks    Status  Met    Target Date 11/08/21  OT LONG TERM GOAL #4   Title Pt will improve L hand grip to 20 or more #s to enable pt to open a new jar.   Baseline no grip in left hand at eval, 10th visit:  improved flexion but still working towards composite fisting and grip. 20th:  continues to demo decreased grip; 08/17/21: active digit flexion improving, but not yet able to register grip on dynamometer; 09/04/21: L grip 1# 7/17:  5#; 12/25/21: L grip 6#; 02/05/22: 13#; 03/12/22: L grip 12#; 1/23//24: L grip 3# (decreased); 04/25/22: L grip 14#; spouse assists to open new jars; 05/21/22: L grip 15#   Time 12    Period Weeks    Status Revised   Target Date 07/18/22     OT LONG TERM GOAL #5   Title Pt will improve left shoulder flexion to 100 degrees or better to improve reaching to obtain self care items from shelf/shoulder height.    Baseline difficulty with reach, shoulder flexion to 47 degrees; 08/17/21: L shoulder flexion 85, but not yet able to consistently hold ADL supplies in L hand when reaching; 09/04/21: flexion 85 7/17:  shoulder flexion to 90; 12/25/21: 105 (P 125); 03/12/22: active L shoulder flexion 110 (pt uses R arm to reach for items at shoulder height or above); 04/16/22: L shoulder flexion 107; 04/25/22: L shoulder flexion 125*, R 132*    Time 12    Period Weeks    Status achieved   Target Date 04/25/22     OT LONG TERM GOAL #6   Title Pt will improve FOTO score to 47 or above to demonstrate a clinically relevant change in  function to impact ADL tasks.    Baseline score of 30 at eval; 08/17/21: FOTO: 42; 09/04/21: FOTO : 54 , FOTO 7/7:  60; 12/25/21: 59; 03/12/22: 66; 04/16/22: 67; 04/25/22; 51; 05/21/22: 74   Time 12    Period Weeks    Status  MET/ongoing    Target Date 04/25/22     OT LONG TERM GOAL #7   Title Pt will demonstrate ability to pick up small objects and complete 9 hole peg test in less than 1 min 30 sec (revised from under 2 min)   Baseline unable to perform at eval, 10th visit: still unable to pick up small pegs, 20th: unable to pick up pegs but improving; 08/17/21: not attempted d/t time constraints, will assess next visit; 09/04/21: 9 hole peg test in 5 min 53 sec;  7/17:  Pt unable to complete this date but was able to place 6 of 9 pegs in 4 mins 20 secs; 12/25/21: L 1 min 43 sec; 03/12/22: L 1 min 2 sec (requires non-skid surface to pick up small items from table top); 04/16/22: 5 pegs in 2 min 36 secs; 04/25/22: 1 min 48 sec; 05/21/22: L 1 min 23 sec   Time 12    Period Weeks    Status Revised/On-going    Target Date 07/18/22    OT LONG TERM GOAL #8   Title Pt will increase LUE strength by 1 MM grade in order to hold blow dryer in LUE to dry hair without dropping dryer.   Baseline 04/25/22: Unable to sustain grip and maintain lift of LUE with hand near head without dropping dryer.  Dryer has caused bruising above L eye with previous attempts.  L shoulder flex/abd 3/5, elbow flex/ext 3+/5    Time 12    Period Weeks    Status New  Target Date 07/18/22        OT LONG TERM GOAL #9   Title Pt will increase L hand dexterity to enable pt to independently manage clothing fasteners.   Baseline 04/25/22: pt ties shoe laces loosely and requires assist from spouse occasionally.  When typing, pt engages the L hand for ~25% of the task.  Pt avoids wearing jeans d/t inability to manage button or zipper.     Time 12    Period Weeks    Status New    Target Date 07/18/22        OT LONG TERM GOAL #10   Title Pt will  be able to independently carry a light plate of food or drink in L hand without spilling/dropping to increase efficiency with item transport in the kitchen.   Baseline 04/25/22: Pt requires constant cues to keep cup or plate level while multitasking in order to prevent spilling.   Time 12    Period Weeks    Status New    Target Date 07/18/22     Plan -     Clinical Impression Statement Pt reports doing well today.  Pt will leave for the beach tomorrow and will be gone a week but she is planning to work on her crocheting and theraputty while she is at the beach to continue to engage the L hand with strengthening and coordination activities.  Pt continues to improve with dexterity skills.  Able to demonstrate ability to reposition ball pegs within fingertips in prep for placement into pegboard with min vc for technique and extra time, but few dropped pegs.  Continue to work towards goals in plan of care to improve left UE functional use in ADL and IADL tasks at home and in the community.    OT Occupational Profile and History Detailed Assessment- Review of Records and additional review of physical, cognitive, psychosocial history related to current functional performance    Occupational performance deficits (Please refer to evaluation for details): ADL's;IADL's;Leisure;Rest and Sleep    Body Structure / Function / Physical Skills ADL;Coordination;Endurance;GMC;UE functional use;Balance;IADL;Pain;Dexterity;FMC;Strength;Edema;Mobility;ROM    Psychosocial Skills Environmental  Adaptations;Habits;Routines and Behaviors    Rehab Potential Good    Clinical Decision Making Several treatment options, min-mod task modification necessary   Comorbidities Affecting Occupational Performance: May have comorbidities impacting occupational performance    Modification or Assistance to Complete Evaluation  Min-Moderate modification of tasks or assist with assess necessary to complete eval    OT Frequency 2x / week     OT Duration 12 weeks    OT Treatment/Interventions Self-care/ADL training;Cryotherapy;Paraffin;Therapeutic exercise;DME and/or AE instruction;Functional Mobility Training;Balance training;Electrical Stimulation;Ultrasound;Neuromuscular education;Manual Therapy;Splinting;Moist Heat;Contrast Bath;Passive range of motion;Therapeutic activities;Patient/family education;Coping strategies training    Plan OT recert    Consulted and Agree with Plan of Care Patient           Leta Speller, MS, OTR/L  Darleene Cleaver, OT 06/02/2022, 6:50 PM

## 2022-06-04 ENCOUNTER — Ambulatory Visit: Payer: Medicare PPO

## 2022-06-06 ENCOUNTER — Ambulatory Visit: Payer: Medicare PPO

## 2022-06-06 NOTE — Progress Notes (Signed)
Carelink Summary Report / Loop Recorder 

## 2022-06-11 ENCOUNTER — Ambulatory Visit: Payer: Medicare PPO

## 2022-06-11 DIAGNOSIS — I63511 Cerebral infarction due to unspecified occlusion or stenosis of right middle cerebral artery: Secondary | ICD-10-CM

## 2022-06-11 DIAGNOSIS — M6281 Muscle weakness (generalized): Secondary | ICD-10-CM | POA: Diagnosis not present

## 2022-06-11 DIAGNOSIS — R278 Other lack of coordination: Secondary | ICD-10-CM

## 2022-06-12 NOTE — Therapy (Signed)
OUTPATIENT OCCUPATIONAL THERAPY NEURO TREATMENT NOTE           Patient Name: Teresa [name] MRN: [id number] DOB:1941-03-05, 82 y.o., female Today's Date: 05/23/2022   PCP: Dr. [name] REFERRING PROVIDER: Dr. [name]   OT End of Session - 06/12/22 1307     Visit Number 104    Number of Visits 117    Date for OT Re-Evaluation 07/18/22    Authorization Time Period Reporting period beginning 05/21/22    Progress Note Due on Visit 10    OT Start Time 1100    OT Stop Time 1145    OT Time Calculation (min) 45 min    Equipment Utilized During Treatment cane    Activity Tolerance Patient tolerated treatment well    Behavior During Therapy WFL for tasks assessed/performed             Past Medical History:  Diagnosis Date   Cancer ([place])    skin   Hypothyroidism    Raynaud's disease    Stroke [facility])    Past Surgical History:  Procedure Laterality Date   [place]  2010   Cervical fusion C4-5-6-7   CATARACT EXTRACTION, BILATERAL Bilateral 10/2016   CHOLECYSTECTOMY  1995   COLONOSCOPY WITH PROPOFOL N/A 12/08/2018   Procedure: COLONOSCOPY WITH PROPOFOL;  Surgeon: Toledo, Benay Pike, MD;  Location: [facility] ENDOSCOPY;  Service: Gastroenterology;  Laterality: N/A;   EYE SURGERY     Patient Active Problem List   Diagnosis Date Noted   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   Following inpt rehab, pt went home and had home health.   PRECAUTIONS: fall  SUBJECTIVE: Pt reports she had a good week at the beach with her family.  She stated that she forgot to pack her built up foam grip for her eating utensils but found that she was able to cut her food without them.    PAIN:  Are you having pain? 0  OBJECTIVE:  L grip 6, R grip 41 L lateral pinch 5, R 13 L 3 point pinch 4, R 14  L 9 hole 1 min 43 sec. L shoulder active flexion 0-105, passive 0-125 02/05/22: L grip 13#  L lateral pinch 8#  3 point pinch 5#  L shoulder flex 0-108 03/12/22:  L grip: 12# L lateral pinch: 8# 3 point pinch: 6# L 9 hole: 1 min 3 sec L shoulder flexion 0-110 04/16/22: L grip: 3# L lateral pinch: 4# 3 point pinch: difficulty maintaining 3 point pinch  L 9 hole: 5 pegs in 2 min 36 sec  L shoulder flexion 0-107 BP L 132/79 HR 80 02 sats 96% 04/25/22: L grip: 14#; R grip: 50# L lateral 2#, R lateral 17# 3 point pinch: L 4#, R 14# (Saehan pinch gauge) 9 hole peg test: 1 min, 48 sec L shoulder flex 125 (R shoulder 132 active) 05/21/22: L  grip: 15#; R grip: 50# L lateral 8#, R lateral 20# 3 point pinch: L 4#, R 12# (Saehan pinch gauge) 9 hole peg test: 1 min, 23 sec  TODAY'S TREATMENT: Therapeutic Exercise: Facilitated L grip strengthening with hand gripper set at 20 # of resistance with 2 red bands to complete 5 sets 10 reps; pt completed 3 sets beginning of session and 2 sets near end of session.  Facilitated pinch strengthening with use of therapy resistant clothespins to target lateral and 3 point pinch of L hand.  Pt was able to pinch yellow, red, green, and blue pins onto vertical dowel, requiring intermittent rest breaks and demo for pinch patterns.  Neuro re-ed: Facilitated L FMC/dexterity skills working to pick up small pegs from towel (non-skid surface) and place into pegboard.  Pt then practiced removing them 1 at a time and storing 5-7 in hand.   PATIENT  EDUCATION: Education details: FMC/dexterity skills Person educated: pt Education method: Merchandiser, retail cues, demo Education comprehension: verbalized understanding, returned demo with tactile and vc   HOME EXERCISE PROGRAM Continue to engage LUE into ADLs; continue use of putty for gripping and pinching exercises for LUE, and L shoulder AROM/AAROM, crocheting, typing; increase participation in IADL tasks for greater use of L hand.    OT Long Term Goals - 06/06/22 (6 weeks)      OT LONG TERM GOAL #1   Title Pt will be independent with home exercise program.    Baseline Eval: no current program, 10th visit:  continue to add new exercises as pt progresses, 20th:  continue to update HEP; 08/17/21: continue to progress HEP when indicated.  Visit 40: adding new exercises as pt progresses; 12/25/21: ongoing with progressions; 03/12/22: inconsistent use of putty but regular use of stress ball (every other day).  Encouraged pt return to daily putty use (2x daily when able) to target grip and pinch strengthening; 04/16/22: not reviewed today d/t OT sent pt to ED; 04/25/22: added pulleys and pt has re-started putty; 05/21/22: stopped pulleys and added stronger theraputty and encouraged pt crochet often    Time 12    Period Weeks    Status On-going    Target Date 06/06/22     OT LONG TERM GOAL #2   Title Pt will complete UB and LB dressing with modified independence including buttons, snaps and zippers.    Baseline requires min assist at eval, 10th visit: occasional assist with buttons, 20th:  able to perform one handed, but difficulty with bilateral UE; 08/17/21: pt reports inconsistent with 1 hand, reviewed techniques this visit, Visit 40:  Pt requires assist with bra   Time 12    Period Weeks    Status MET   Target Date 11/08/21     OT LONG TERM GOAL #3   Title Pt will perform shower transfer with modified independence.    Baseline Pt requires supervision to min assist for shower transfer at  home. 10th visit: supervision; 08/17/21: supv .  Visit 40:  Pt had met goal but had recent fall and now requiring min guard to supv   Time 6    Period Weeks    Status  Met    Target Date 11/08/21      OT LONG TERM GOAL #4   Title Pt will improve L hand grip to 20 or more #s to enable pt to open a new jar.   Baseline no grip in left hand at eval, 10th visit:  improved flexion  but still working towards composite fisting and grip. 20th:  continues to demo decreased grip; 08/17/21: active digit flexion improving, but not yet able to register grip on dynamometer; 09/04/21: L grip 1# 7/17:  5#; 12/25/21: L grip 6#; 02/05/22: 13#; 03/12/22: L grip 12#; 1/23//24: L grip 3# (decreased); 04/25/22: L grip 14#; spouse assists to open new jars; 05/21/22: L grip 15#   Time 12    Period Weeks    Status Revised   Target Date 07/18/22     OT LONG TERM GOAL #5   Title Pt will improve left shoulder flexion to 100 degrees or better to improve reaching to obtain self care items from shelf/shoulder height.    Baseline difficulty with reach, shoulder flexion to 47 degrees; 08/17/21: L shoulder flexion 85, but not yet able to consistently hold ADL supplies in L hand when reaching; 09/04/21: flexion 85 7/17:  shoulder flexion to 90; 12/25/21: 105 (P 125); 03/12/22: active L shoulder flexion 110 (pt uses R arm to reach for items at shoulder height or above); 04/16/22: L shoulder flexion 107; 04/25/22: L shoulder flexion 125*, R 132*    Time 12    Period Weeks    Status achieved   Target Date 04/25/22     OT LONG TERM GOAL #6   Title Pt will improve FOTO score to 47 or above to demonstrate a clinically relevant change in function to impact ADL tasks.    Baseline score of 30 at eval; 08/17/21: FOTO: 42; 09/04/21: FOTO : 54 , FOTO 7/7:  60; 12/25/21: 59; 03/12/22: 66; 04/16/22: 67; 04/25/22; 51; 05/21/22: 74   Time 12    Period Weeks    Status  MET/ongoing    Target Date 04/25/22     OT LONG TERM GOAL #7   Title Pt will demonstrate  ability to pick up small objects and complete 9 hole peg test in less than 1 min 30 sec (revised from under 2 min)   Baseline unable to perform at eval, 10th visit: still unable to pick up small pegs, 20th: unable to pick up pegs but improving; 08/17/21: not attempted d/t time constraints, will assess next visit; 09/04/21: 9 hole peg test in 5 min 53 sec;  7/17:  Pt unable to complete this date but was able to place 6 of 9 pegs in 4 mins 20 secs; 12/25/21: L 1 min 43 sec; 03/12/22: L 1 min 2 sec (requires non-skid surface to pick up small items from table top); 04/16/22: 5 pegs in 2 min 36 secs; 04/25/22: 1 min 48 sec; 05/21/22: L 1 min 23 sec   Time 12    Period Weeks    Status Revised/On-going    Target Date 07/18/22    OT LONG TERM GOAL #8   Title Pt will increase LUE strength by 1 MM grade in order to hold blow dryer in LUE to dry hair without dropping dryer.   Baseline 04/25/22: Unable to sustain grip and maintain lift of LUE with hand near head without dropping dryer.  Dryer has caused bruising above L eye with previous attempts.  L shoulder flex/abd 3/5, elbow flex/ext 3+/5    Time 12    Period Weeks    Status New    Target Date 07/18/22        OT LONG TERM GOAL #9   Title Pt will increase L hand dexterity to enable pt to independently manage clothing fasteners.   Baseline 04/25/22: pt ties shoe laces loosely  and requires assist from spouse occasionally.  When typing, pt engages the L hand for ~25% of the task.  Pt avoids wearing jeans d/t inability to manage button or zipper.     Time 12    Period Weeks    Status New    Target Date 07/18/22        OT LONG TERM GOAL #10   Title Pt will be able to independently carry a light plate of food or drink in L hand without spilling/dropping to increase efficiency with item transport in the kitchen.   Baseline 04/25/22: Pt requires constant cues to keep cup or plate level while multitasking in order to prevent spilling.   Time 12    Period Weeks     Status New    Target Date 07/18/22     Plan -     Clinical Impression Statement Pt reports she had a good week at the beach with her family.  She stated that she forgot to pack her built up foam grip for her eating utensils but found that she was able to cut her food without them.  OT encouraged pt start to use utensils at home without built up grip; pt agreed.  Pt continues to focus on L hand strengthening.  Pt struggles with clipping the blue and black clothespins but is managing yellow, red, and green with fewer rest periods.  Pt continues to develop [facility] skills in the L hand.  Pt demonstrates improved ability to pick up and place pegs with fewer dropped pegs, and is demonstrating improved ability to store a handful of pegs in hand without dropping them.  Pt will continue to work towards goals in plan of care to improve left UE functional use in ADL and IADL tasks at home and in the community.    OT Occupational Profile and History Detailed Assessment- Review of Records and additional review of physical, cognitive, psychosocial history related to current functional performance    Occupational performance deficits (Please refer to evaluation for details): ADL's;IADL's;Leisure;Rest and Sleep    Body Structure / Function / Physical Skills ADL;Coordination;Endurance;GMC;UE functional use;Balance;IADL;Pain;Dexterity;FMC;Strength;Edema;Mobility;ROM    Psychosocial Skills Environmental  Adaptations;Habits;Routines and Behaviors    Rehab Potential Good    Clinical Decision Making Several treatment options, min-mod task modification necessary   Comorbidities Affecting Occupational Performance: May have comorbidities impacting occupational performance    Modification or Assistance to Complete Evaluation  Min-Moderate modification of tasks or assist with assess necessary to complete eval    OT Frequency 2x / week    OT Duration 12 weeks    OT Treatment/Interventions Self-care/ADL  training;Cryotherapy;Paraffin;Therapeutic exercise;DME and/or AE instruction;Functional Mobility Training;Balance training;Electrical Stimulation;Ultrasound;Neuromuscular education;Manual Therapy;Splinting;Moist Heat;Contrast Bath;Passive range of motion;Therapeutic activities;Patient/family education;Coping strategies training    Plan OT recert    Consulted and Agree with Plan of Care Patient           Leta Speller, MS, OTR/L  Darleene Cleaver, OT 06/12/2022, 1:09 PM

## 2022-06-12 NOTE — Therapy (Signed)
OUTPATIENT PHYSICAL THERAPY NEURO TREATMENT   Patient Name: Teresa [name] MRN: [id number] DOB:03-22-1941, 82 y.o., female Today's Date: 06/13/2022   PCP: Dr. [name] REFERRING PROVIDER: Dr. [name]  END OF SESSION:  PT End of Session - 06/13/22 1346     Visit Number 4    Number of Visits 24    Date for PT Re-Evaluation 08/15/22    Authorization Time Period 05/23/2022- 08/15/2022    Progress Note Due on Visit 10    PT Start Time 1346    PT Stop Time 1429    PT Time Calculation (min) 43 min    Equipment Utilized During Treatment Gait belt    Activity Tolerance Patient tolerated treatment well    Behavior During Therapy WFL for tasks assessed/performed              Past Medical History:  Diagnosis Date   Cancer ([place])    skin   Hypothyroidism    Raynaud's disease    Stroke [facility])    Past Surgical History:  Procedure Laterality Date   [place]   BACK SURGERY  2010   Cervical fusion C4-5-6-7   CATARACT EXTRACTION, BILATERAL Bilateral 10/2016   CHOLECYSTECTOMY  1995   COLONOSCOPY WITH PROPOFOL N/A 12/08/2018   Procedure: COLONOSCOPY WITH PROPOFOL;  Surgeon: Toledo, Benay Pike, MD;  Location: [facility] ENDOSCOPY;  Service: Gastroenterology;  Laterality: N/A;   EYE SURGERY     Patient Active Problem List   Diagnosis Date Noted   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   Stroke ([place]) 02/27/2021   History of nonmelanoma skin cancer 05/23/2014    ONSET DATE: 04/16/2022  REFERRING DIAG: 69.354 (ICD-10-CM) - Hemiplegia and hemiparesis following cerebral infarction affecting left non-dominant side   THERAPY DIAG:  Muscle weakness (generalized)  Right middle cerebral artery stroke ([facility])  Unsteadiness on feet  Difficulty in walking, not elsewhere classified  Rationale for Evaluation and Treatment: Rehabilitation  SUBJECTIVE:                                                                                                                                                                                              SUBJECTIVE STATEMENT: Patient reports her L knee hurts when it pops. No falls or LOB since last session.    Pt accompanied by: significant other  PERTINENT HISTORY: Pt is a 82 y.o. female with referral for hemiplegia with left sided weakness -original CVA on 02/26/21 and new onset of left sided weakness on 04/16/2022.  R MCA CVA on 02/26/21. Pt completed outpatient PT on 01/17/2022  with majority of goals met. PMH includes: skin cancer, hypothyroidism, and Raynaud's disease, history of cervical fusion in 2010 C4-C7.   PAIN:  Are you having pain? No  PRECAUTIONS: Fall  WEIGHT BEARING RESTRICTIONS: No  FALLS: Has patient fallen in last 6 months? No  LIVING ENVIRONMENT: Lives with: lives with their spouse Lives in: House/apartment Stairs: Yes: External: 1 steps; none Has following equipment at home: Single point cane, Walker - 2 wheeled, shower chair, and Grab bars  PLOF: Independent  PATIENT GOALS: I want to walk without this cane and no falls and enjoy going out to grandkids sporting event  OBJECTIVE:   DIAGNOSTIC FINDINGS: CLINICAL DATA:  Stroke suspected   EXAM: MRI HEAD WITHOUT CONTRAST   TECHNIQUE: Multiplanar, multiecho pulse sequences of the brain and surrounding structures were obtained without intravenous contrast.   COMPARISON:  MRI Head 02/26/21, CT Head 04/16/22   FINDINGS: Brain: Evolving right MCA territory infarct involving the right frontal operculum in the right insular cortex. No acute infarction, hemorrhage, hydrocephalus, extra-axial collection or mass lesion. Small focus of microhemorrhage in the left temporal lobe. There is petechial hemorrhage and/or hemosiderosis in the region of prior right MCA territory infarct. T2/stir hyperintense signal in the right cerebral peduncle is favored to represent transneuronal degeneration.   Vascular:  Major flow voids are preserved.   Skull and upper cervical spine: Normal marrow signal.   Sinuses/Orbits: Bilateral lens replacement. No mastoid or middle ear effusion. Paranasal sinuses are clear.   Other: None.   IMPRESSION: 1. No acute intracranial abnormality. 2. 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 3.     Electronically Signed   By: Marin Roberts M.D.   On: 04/16/2022 13:41  COGNITION: Overall cognitive status: Within functional limits for tasks assessed   SENSATION: WFL  COORDINATION: Intact with each LE  EDEMA:  None observed  MUSCLE TONE: LLE: Within functional limits  DTRs:  Patella 2+ = Normal and Achilles 2+ = Normal  POSTURE: rounded shoulders and forward head  LOWER EXTREMITY ROM:     Active  Right Eval Left Eval  Hip flexion WNL THROUGHOUT LE WNL THROUGHOUT LE  Hip extension    Hip abduction    Hip adduction    Hip internal rotation    Hip external rotation    Knee flexion    Knee extension    Ankle dorsiflexion    Ankle plantarflexion    Ankle inversion    Ankle eversion     (Blank rows = not tested)  LOWER EXTREMITY MMT:    MMT Right Eval Left Eval  Hip flexion 4+ 4  Hip extension 4+ 4  Hip abduction 4+ 4  Hip adduction 4+ 4  Hip internal rotation 4+ 4  Hip external rotation 4+ 4  Knee flexion 4+ 4  Knee extension 5 4  Ankle dorsiflexion 5 4  Ankle plantarflexion    Ankle inversion    Ankle eversion    (Blank rows = not tested)  BED MOBILITY:  Patient reports independent with all bed mobility and states has returned to master bed  TRANSFERS: Assistive device utilized: None  Sit to stand: Complete Independence Stand to sit: Complete Independence Chair to chair: Complete Independence Floor:  Not tested   GAIT: Gait pattern: step through pattern, decreased arm swing- Right, decreased arm swing- Left, decreased step length- Right, and decreased step  length- Left Distance walked: 915 feet Assistive device utilized: None Level of assistance:  SBA Comments: occasional scuff or decreased left foot clearance  FUNCTIONAL TESTS:  5 times sit to stand: 23.62 sec without UE support Timed up and go (TUG): 20.14 sec without an AD 10 meter walk test: 0.70 m/s  PATIENT SURVEYS:  FOTO 60/ goal of 66  TODAY'S TREATMENT:                                                                                                                              DATE: 05/31/2022  Neuro Re-Ed:   STS with airex pad under RLE: 1x10, SBA reporting as "easy". 2x8 with 2KG med ball raises overhead to challenge AP/PA ankle righting reaction and LUE strength.  CGA.  The patient adapted well to [facility] during the initial introduction, showing improved motor skills and enthusiasm. Moving forward, we plan to integrate Blaze Pods into the rehabilitation process, using their dual-tasking technology for personalized exercises to enhance both physical and cognitive aspects of recovery.   Activity Description: standing on airex pad: red=right hand/foot green =left hand/foot; 3 on floor 3 on table.  Activity Setting:  The Blaze Pod Random setting was chosen to enhance cognitive processing and agility, providing an unpredictable environment to simulate real-world scenarios, and fostering quick reactions and adaptability.   Number of Pods:  6 Cycles/Sets:  2 Duration (Time or Hit Count):  10  Activity Description: circle with one in the center Activity Setting:  The [facility] setting was selected for the goal of improving spatial awareness and visual scanning ability, establishing a central point of reference during dynamic movements for enhanced balance and control.   Number of Pods:  6 Cycles/Sets:  2 Duration (Time or Hit Count):  10  Airex pad dual task: word game on whiteboard x 8 minutes with visual scan and reach.   Hallway: Gentle toss to self with ball while  walking 86 ft x 4 trials  Red light/green light for sudden initiation/termination of tasks 86 ft x 2 trials   TrA activation swiss ball press 10x for core activation. TrA activation with swiss ball and UE alternating raises 12x each LE   PATIENT EDUCATION: Education details: PT plan of care Person educated: Patient Education method: Explanation, Demonstration, Tactile cues, Verbal cues, and Handouts Education comprehension: verbalized understanding, returned demonstration, verbal cues required, tactile cues required, and needs further education  HOME EXERCISE PROGRAM: To be updated next 1-2 visit  GOALS: Goals reviewed with patient? Yes  SHORT TERM GOALS: Target date: 07/04/2022  Patient will be independent in home exercise program to improve strength/mobility for better functional independence with ADLs  Baseline: EVAL: Patient reports mostly just walking  Goal status: INITIAL  LONG TERM GOALS: Target date: 08/14/2021  Pt will decrease 5TSTS by at least 6 seconds in order to demonstrate clinically significant improvement in LE strength. Baseline: EVAL= 23.62 sec without UE  Goal status: INITIAL  2. Pt will improve FOTO to target score of 66 to  display perceived improvements in ability to complete ADL's.  Baseline: Eval= 60 Goal status: INITIAL  3.  Pt will decrease TUG to below 14 seconds/decrease in order to demonstrate decreased fall risk. Baseline: Eval= 20.14 sec without AD Goal status: INITIAL  4.  Patient will increase six minute walk test distance to >1200 ft for improve gait ability and return to PLOF to complete her walks in her neighborhood.  Baseline: EVAL= 915 feet without an AD Goal status: INITIAL 5. Pt will increase 10MWT by at least 0.13 m/s in order to demonstrate clinically significant improvement in community ambulation.    Baseline: Eval = 0.70 m/s  Goal status: INITIAL  ASSESSMENT:  CLINICAL IMPRESSION: Patient presents with excellent motivation  throughout physical therapy session. She is eager to progress her mobility. The patient demonstrated significant progress while utilizing [employer], showcasing improved coordination, balance, and cognitive function. The incorporation of dual-tasking technology with color recognition and association with specific movements in Blaze Pods was strategically chosen to provide a dynamic training environment, enabling the patient to engage in simultaneous physical and cognitive tasks. This unique approach enhances not only their physical abilities but also fosters increased neural connectivity and mental awareness, contributing to a well-rounded and effective rehabilitation and training experience.   Pt will benefit from skilled PT services to improve her overall left LE muscle strength and improve her mobility to decrease her fall risk and improve her overall quality of life.   OBJECTIVE IMPAIRMENTS: Abnormal gait, decreased activity tolerance, decreased balance, decreased coordination, decreased endurance, decreased mobility, difficulty walking, and decreased strength.   ACTIVITY LIMITATIONS: carrying, lifting, bending, standing, squatting, stairs, and transfers  PARTICIPATION LIMITATIONS: cleaning, laundry, driving, shopping, community activity, and yard work  PERSONAL FACTORS: 3+ comorbidities: OA, HTN, hx of previous CVA  are also affecting patient's functional outcome.   REHAB POTENTIAL: Good  CLINICAL DECISION MAKING: Evolving/moderate complexity  EVALUATION COMPLEXITY: Moderate  PLAN:  PT FREQUENCY: 1-2x/week  PT DURATION: 12 weeks  PLANNED INTERVENTIONS: Therapeutic exercises, Therapeutic activity, Neuromuscular re-education, Balance training, Gait training, Patient/Family education, Self Care, Joint mobilization, Joint manipulation, Stair training, Vestibular training, Canalith repositioning, DME instructions, Dry Needling, Electrical stimulation, Spinal manipulation, Spinal mobilization,  Cryotherapy, Moist heat, Taping, and Manual therapy  PLAN FOR NEXT SESSION: Perform balance testing and add goal as appropriate. LE strengthening and add to HEP as appropriate next session.   Janna Arch PT' Physical Therapist- [facility]  06/13/2022, 2:32 PM

## 2022-06-13 ENCOUNTER — Ambulatory Visit: Payer: Medicare PPO

## 2022-06-13 DIAGNOSIS — R278 Other lack of coordination: Secondary | ICD-10-CM

## 2022-06-13 DIAGNOSIS — M6281 Muscle weakness (generalized): Secondary | ICD-10-CM

## 2022-06-13 DIAGNOSIS — R262 Difficulty in walking, not elsewhere classified: Secondary | ICD-10-CM

## 2022-06-13 DIAGNOSIS — R2681 Unsteadiness on feet: Secondary | ICD-10-CM

## 2022-06-13 DIAGNOSIS — I63511 Cerebral infarction due to unspecified occlusion or stenosis of right middle cerebral artery: Secondary | ICD-10-CM

## 2022-06-16 NOTE — Therapy (Signed)
OUTPATIENT OCCUPATIONAL THERAPY NEURO TREATMENT NOTE           Patient Name: Teresa [name] MRN: [id number] DOB:05/07/1940, 82 y.o., female Today's Date: 05/23/2022   PCP: Dr. [name] REFERRING PROVIDER: Dr. [name]   OT End of Session - 06/16/22 1049     Visit Number 105    Number of Visits 117    Date for OT Re-Evaluation 07/18/22    Authorization Time Period Reporting period beginning 05/21/22    Progress Note Due on Visit 10    OT Start Time 1430    OT Stop Time 1515    OT Time Calculation (min) 45 min    Equipment Utilized During Treatment cane    Activity Tolerance Patient tolerated treatment well    Behavior During Therapy WFL for tasks assessed/performed             Past Medical History:  Diagnosis Date   Cancer ([place])    skin   Hypothyroidism    Raynaud's disease    Stroke [facility])    Past Surgical History:  Procedure Laterality Date   [place]  2010   Cervical fusion C4-5-6-7   CATARACT EXTRACTION, BILATERAL Bilateral 10/2016   CHOLECYSTECTOMY  1995   COLONOSCOPY WITH PROPOFOL N/A 12/08/2018   Procedure: COLONOSCOPY WITH PROPOFOL;  Surgeon: Toledo, Benay Pike, MD;  Location: [facility] ENDOSCOPY;  Service: Gastroenterology;  Laterality: N/A;   EYE SURGERY     Patient Active Problem List   Diagnosis Date Noted   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   Following inpt rehab, pt went home and had home health.   PRECAUTIONS: fall  SUBJECTIVE: Pt reports doing well today.  PAIN:  Are you having pain? 0  OBJECTIVE:  L grip 6, R grip 41 L lateral pinch 5, R 13 L 3 point pinch 4, R 14  L 9 hole 1 min 43 sec. L shoulder active flexion 0-105, passive 0-125 02/05/22: L grip 13#  L lateral pinch 8#  3 point pinch 5#  L shoulder flex 0-108 03/12/22:  L grip: 12# L lateral pinch: 8# 3 point pinch: 6# L 9 hole: 1 min 3 sec L shoulder flexion 0-110 04/16/22: L grip: 3# L lateral pinch: 4# 3 point pinch: difficulty maintaining 3 point pinch  L 9 hole: 5 pegs in 2 min 36 sec  L shoulder flexion 0-107 BP L 132/79 HR 80 02 sats 96% 04/25/22: L grip: 14#; R grip: 50# L lateral 2#, R lateral 17# 3 point pinch: L 4#, R 14# (Saehan pinch gauge) 9 hole peg test: 1 min, 48 sec L shoulder flex 125 (R shoulder 132 active) 05/21/22: L grip: 15#; R grip: 50# L lateral 8#, R lateral 20# 3 point pinch: L 4#, R 12# (Saehan pinch gauge) 9 hole peg test: 1 min, 23 sec  TODAY'S TREATMENT: Therapeutic Exercise: Facilitated L grip strengthening with  hand gripper set at 20 # of resistance with 2 red bands to complete 5 sets 10 reps; pt completed 3 sets beginning of session and 2 sets near end of session.   Neuro re-ed: Facilitated L hand FMC/dexterity skills working to place, flip, and remove [place] discs from grid.  Practiced timed trials to promote speed.  Worked with grooved pegs and placing them into pegboard, requiring mod vc and extra time for technique.    PATIENT EDUCATION: Education details: FMC/dexterity skills Person educated: pt Education method: Merchandiser, retail cues, demo Education comprehension: verbalized understanding, returned demo with tactile and vc   HOME EXERCISE PROGRAM Continue to engage LUE into ADLs; continue use of putty for gripping and pinching exercises for LUE, and L shoulder AROM/AAROM, crocheting,  typing; increase participation in IADL tasks for greater use of L hand.    OT Long Term Goals - 06/06/22 (6 weeks)      OT LONG TERM GOAL #1   Title Pt will be independent with home exercise program.    Baseline Eval: no current program, 10th visit:  continue to add new exercises as pt progresses, 20th:  continue to update HEP; 08/17/21: continue to progress HEP when indicated.  Visit 40: adding new exercises as pt progresses; 12/25/21: ongoing with progressions; 03/12/22: inconsistent use of putty but regular use of stress ball (every other day).  Encouraged pt return to daily putty use (2x daily when able) to target grip and pinch strengthening; 04/16/22: not reviewed today d/t OT sent pt to ED; 04/25/22: added pulleys and pt has re-started putty; 05/21/22: stopped pulleys and added stronger theraputty and encouraged pt crochet often    Time 12    Period Weeks    Status On-going    Target Date 06/06/22     OT LONG TERM GOAL #2   Title Pt will complete UB and LB dressing with modified independence including buttons, snaps and zippers.    Baseline requires min assist at eval, 10th visit: occasional assist with buttons, 20th:  able to perform one handed, but difficulty with bilateral UE; 08/17/21: pt reports inconsistent with 1 hand, reviewed techniques this visit, Visit 40:  Pt requires assist with bra   Time 12    Period Weeks    Status MET   Target Date 11/08/21     OT LONG TERM GOAL #3   Title Pt will perform shower transfer with modified independence.    Baseline Pt requires supervision to min assist for shower transfer at home. 10th visit: supervision; 08/17/21: supv .  Visit 40:  Pt had met goal but had recent fall and now requiring min guard to supv   Time 6    Period Weeks    Status  Met    Target Date 11/08/21      OT LONG TERM GOAL #4   Title Pt will improve L hand grip to 20 or more #s to enable pt to open a new jar.   Baseline no grip in left hand at eval, 10th visit:  improved  flexion but still working towards composite fisting and grip. 20th:  continues to demo decreased grip; 08/17/21: active digit flexion improving, but not yet able to register grip on dynamometer; 09/04/21: L grip 1# 7/17:  5#; 12/25/21: L grip 6#; 02/05/22: 13#; 03/12/22: L grip 12#; 1/23//24: L grip 3# (decreased); 04/25/22: L grip 14#; spouse assists to open new jars; 05/21/22: L grip 15#   Time 12    Period Weeks  Status Revised   Target Date 07/18/22     OT LONG TERM GOAL #5   Title Pt will improve left shoulder flexion to 100 degrees or better to improve reaching to obtain self care items from shelf/shoulder height.    Baseline difficulty with reach, shoulder flexion to 47 degrees; 08/17/21: L shoulder flexion 85, but not yet able to consistently hold ADL supplies in L hand when reaching; 09/04/21: flexion 85 7/17:  shoulder flexion to 90; 12/25/21: 105 (P 125); 03/12/22: active L shoulder flexion 110 (pt uses R arm to reach for items at shoulder height or above); 04/16/22: L shoulder flexion 107; 04/25/22: L shoulder flexion 125*, R 132*    Time 12    Period Weeks    Status achieved   Target Date 04/25/22     OT LONG TERM GOAL #6   Title Pt will improve FOTO score to 47 or above to demonstrate a clinically relevant change in function to impact ADL tasks.    Baseline score of 30 at eval; 08/17/21: FOTO: 42; 09/04/21: FOTO : 54 , FOTO 7/7:  60; 12/25/21: 59; 03/12/22: 66; 04/16/22: 67; 04/25/22; 51; 05/21/22: 74   Time 12    Period Weeks    Status  MET/ongoing    Target Date 04/25/22     OT LONG TERM GOAL #7   Title Pt will demonstrate ability to pick up small objects and complete 9 hole peg test in less than 1 min 30 sec (revised from under 2 min)   Baseline unable to perform at eval, 10th visit: still unable to pick up small pegs, 20th: unable to pick up pegs but improving; 08/17/21: not attempted d/t time constraints, will assess next visit; 09/04/21: 9 hole peg test in 5 min 53 sec;  7/17:  Pt unable to  complete this date but was able to place 6 of 9 pegs in 4 mins 20 secs; 12/25/21: L 1 min 43 sec; 03/12/22: L 1 min 2 sec (requires non-skid surface to pick up small items from table top); 04/16/22: 5 pegs in 2 min 36 secs; 04/25/22: 1 min 48 sec; 05/21/22: L 1 min 23 sec   Time 12    Period Weeks    Status Revised/On-going    Target Date 07/18/22    OT LONG TERM GOAL #8   Title Pt will increase LUE strength by 1 MM grade in order to hold blow dryer in LUE to dry hair without dropping dryer.   Baseline 04/25/22: Unable to sustain grip and maintain lift of LUE with hand near head without dropping dryer.  Dryer has caused bruising above L eye with previous attempts.  L shoulder flex/abd 3/5, elbow flex/ext 3+/5    Time 12    Period Weeks    Status New    Target Date 07/18/22        OT LONG TERM GOAL #9   Title Pt will increase L hand dexterity to enable pt to independently manage clothing fasteners.   Baseline 04/25/22: pt ties shoe laces loosely and requires assist from spouse occasionally.  When typing, pt engages the L hand for ~25% of the task.  Pt avoids wearing jeans d/t inability to manage button or zipper.     Time 12    Period Weeks    Status New    Target Date 07/18/22        OT LONG TERM GOAL #10   Title Pt will be able to independently carry a light  plate of food or drink in L hand without spilling/dropping to increase efficiency with item transport in the kitchen.   Baseline 04/25/22: Pt requires constant cues to keep cup or plate level while multitasking in order to prevent spilling.   Time 12    Period Weeks    Status New    Target Date 07/18/22     Plan -     Clinical Impression Statement Pt continues to focus on L hand strengthening and coordination skills.  Pt was able to place 1 grooved peg into a designated slot with extra time and trials to turn the peg within fingertips to line up the groove.  Other pegs had to be placed in a groove that already matched the position of the  peg in pt's fingertips as pt struggled to manipulate pegs without dropping them.  Pt will continue to work towards goals in plan of care to improve left UE functional use in ADL and IADL tasks at home and in the community.    OT Occupational Profile and History Detailed Assessment- Review of Records and additional review of physical, cognitive, psychosocial history related to current functional performance    Occupational performance deficits (Please refer to evaluation for details): ADL's;IADL's;Leisure;Rest and Sleep    Body Structure / Function / Physical Skills ADL;Coordination;Endurance;GMC;UE functional use;Balance;IADL;Pain;Dexterity;FMC;Strength;Edema;Mobility;ROM    Psychosocial Skills Environmental  Adaptations;Habits;Routines and Behaviors    Rehab Potential Good    Clinical Decision Making Several treatment options, min-mod task modification necessary   Comorbidities Affecting Occupational Performance: May have comorbidities impacting occupational performance    Modification or Assistance to Complete Evaluation  Min-Moderate modification of tasks or assist with assess necessary to complete eval    OT Frequency 2x / week    OT Duration 12 weeks    OT Treatment/Interventions Self-care/ADL training;Cryotherapy;Paraffin;Therapeutic exercise;DME and/or AE instruction;Functional Mobility Training;Balance training;Electrical Stimulation;Ultrasound;Neuromuscular education;Manual Therapy;Splinting;Moist Heat;Contrast Bath;Passive range of motion;Therapeutic activities;Patient/family education;Coping strategies training    Plan OT recert    Consulted and Agree with Plan of Care Patient           Leta Speller, MS, OTR/L  Darleene Cleaver, OT 06/16/2022, 11:06 AM

## 2022-06-18 ENCOUNTER — Ambulatory Visit: Payer: Medicare PPO

## 2022-06-18 DIAGNOSIS — I63511 Cerebral infarction due to unspecified occlusion or stenosis of right middle cerebral artery: Secondary | ICD-10-CM

## 2022-06-18 DIAGNOSIS — M6281 Muscle weakness (generalized): Secondary | ICD-10-CM

## 2022-06-18 DIAGNOSIS — R278 Other lack of coordination: Secondary | ICD-10-CM

## 2022-06-18 DIAGNOSIS — R262 Difficulty in walking, not elsewhere classified: Secondary | ICD-10-CM

## 2022-06-18 DIAGNOSIS — R2681 Unsteadiness on feet: Secondary | ICD-10-CM

## 2022-06-18 NOTE — Therapy (Signed)
OUTPATIENT PHYSICAL THERAPY NEURO TREATMENT   Patient Name: Teresa [name] MRN: [id number] DOB:[birth year], 82 y.o., female Today's Date: 06/18/2022   PCP: Dr. [name] REFERRING PROVIDER: Dr. [name]  END OF SESSION:  PT End of Session - 06/18/22 1149     Visit Number 5    Number of Visits 24    Date for PT Re-Evaluation 08/15/22    Authorization Time Period 05/23/2022- 08/15/2022    Progress Note Due on Visit 10    PT Start Time [place] During Treatment Gait belt    Activity Tolerance Patient tolerated treatment well    Behavior During Therapy [facility] for tasks assessed/performed               Past Medical History:  Diagnosis Date   Cancer ([place])    skin   Hypothyroidism    Raynaud's disease    Stroke [facility])    Past Surgical History:  Procedure Laterality Date   [place]  2010   Cervical fusion C4-5-6-7   CATARACT EXTRACTION, BILATERAL Bilateral 10/2016   CHOLECYSTECTOMY  1995   COLONOSCOPY WITH PROPOFOL N/A 12/08/2018   Procedure: COLONOSCOPY WITH PROPOFOL;  Surgeon: Toledo, Benay Pike, MD;  Location: [facility] ENDOSCOPY;  Service: Gastroenterology;  Laterality: N/A;   EYE SURGERY     Patient Active Problem List   Diagnosis Date Noted   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   Stroke ([place]) 02/27/2021   History of nonmelanoma skin cancer 05/23/2014    ONSET DATE: 04/16/2022  REFERRING DIAG: 69.354 (ICD-10-CM) - Hemiplegia and hemiparesis following cerebral infarction affecting left non-dominant side   THERAPY DIAG:  Muscle weakness (generalized)  Right middle cerebral artery stroke ([facility])  Unsteadiness on feet  Difficulty in walking, not elsewhere classified  Other lack of coordination  Rationale for Evaluation and Treatment: Rehabilitation  SUBJECTIVE:                                                                                                                                                                                              SUBJECTIVE STATEMENT: Patient reports doing well overall today without significant issues.     Pt accompanied by: significant other  PERTINENT HISTORY: Pt is a 82 y.o. female with referral for hemiplegia with left sided weakness -original CVA on 02/26/21 and new onset of left sided weakness on 04/16/2022.  R MCA CVA on 02/26/21. Pt completed outpatient PT on 01/17/2022 with majority of goals met. PMH includes: skin cancer, hypothyroidism, and Raynaud's disease, history of cervical  fusion in 2010 C4-C7.   PAIN:  Are you having pain? No  PRECAUTIONS: Fall  WEIGHT BEARING RESTRICTIONS: No  FALLS: Has patient fallen in last 6 months? No  LIVING ENVIRONMENT: Lives with: lives with their spouse Lives in: House/apartment Stairs: Yes: External: 1 steps; none Has following equipment at home: Single point cane, Walker - 2 wheeled, shower chair, and Grab bars  PLOF: Independent  PATIENT GOALS: I want to walk without this cane and no falls and enjoy going out to grandkids sporting event  OBJECTIVE:   DIAGNOSTIC FINDINGS: CLINICAL DATA:  Stroke suspected   EXAM: MRI HEAD WITHOUT CONTRAST   TECHNIQUE: Multiplanar, multiecho pulse sequences of the brain and surrounding structures were obtained without intravenous contrast.   COMPARISON:  MRI Head 02/26/21, CT Head 04/16/22   FINDINGS: Brain: Evolving right MCA territory infarct involving the right frontal operculum in the right insular cortex. No acute infarction, hemorrhage, hydrocephalus, extra-axial collection or mass lesion. Small focus of microhemorrhage in the left temporal lobe. There is petechial hemorrhage and/or hemosiderosis in the region of prior right MCA territory infarct. T2/stir hyperintense signal in the right cerebral peduncle is favored to represent transneuronal degeneration.   Vascular: Major flow voids are preserved.   Skull  and upper cervical spine: Normal marrow signal.   Sinuses/Orbits: Bilateral lens replacement. No mastoid or middle ear effusion. Paranasal sinuses are clear.   Other: None.   IMPRESSION: 1. No acute intracranial abnormality. 2. 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 3.     Electronically Signed   By: Marin Roberts M.D.   On: 04/16/2022 13:41  COGNITION: Overall cognitive status: Within functional limits for tasks assessed   SENSATION: WFL  COORDINATION: Intact with each LE  EDEMA:  None observed  MUSCLE TONE: LLE: Within functional limits  DTRs:  Patella 2+ = Normal and Achilles 2+ = Normal  POSTURE: rounded shoulders and forward head  LOWER EXTREMITY ROM:     Active  Right Eval Left Eval  Hip flexion WNL THROUGHOUT LE WNL THROUGHOUT LE  Hip extension    Hip abduction    Hip adduction    Hip internal rotation    Hip external rotation    Knee flexion    Knee extension    Ankle dorsiflexion    Ankle plantarflexion    Ankle inversion    Ankle eversion     (Blank rows = not tested)  LOWER EXTREMITY MMT:    MMT Right Eval Left Eval  Hip flexion 4+ 4  Hip extension 4+ 4  Hip abduction 4+ 4  Hip adduction 4+ 4  Hip internal rotation 4+ 4  Hip external rotation 4+ 4  Knee flexion 4+ 4  Knee extension 5 4  Ankle dorsiflexion 5 4  Ankle plantarflexion    Ankle inversion    Ankle eversion    (Blank rows = not tested)  BED MOBILITY:  Patient reports independent with all bed mobility and states has returned to master bed  TRANSFERS: Assistive device utilized: None  Sit to stand: Complete Independence Stand to sit: Complete Independence Chair to chair: Complete Independence Floor:  Not tested   GAIT: Gait pattern: step through pattern, decreased arm swing- Right, decreased arm swing- Left, decreased step length- Right, and decreased step length- Left Distance walked: 915  feet Assistive device utilized: None Level of assistance: SBA Comments: occasional scuff or decreased left foot clearance  FUNCTIONAL TESTS:  5 times sit  to stand: 23.62 sec without UE support Timed up and go (TUG): 20.14 sec without an AD 10 meter walk test: 0.70 m/s  PATIENT SURVEYS:  FOTO 60/ goal of 66  TODAY'S TREATMENT:                                                                                                                              DATE: 06/18/2022  Neuro Re-Ed:      Dynamic High knee march- in // bars x 3  Dynamic butt kick in // bars x 3.  Dynamic side stepping in // bars x 3 Dynamic step tap onto 4 cones x 3  Dynamic tandem gait on airex beam x 5 Dynamic side step on airex beam x 5 Static stand on airex beam x 30 sec with eyes closed   STS with 2KG med ball raises overhead x 12 reps to challenge AP/PA ankle righting reaction and LUE strength.  CGA.  Heel walking x 4 down and back in // bars without UE support  Toe walking x 4 down and back in // bars without UE support  Single leg stance x 5-10 sec x multiple attempts (6 sec at best on left and > 10 sec on right LE)   PATIENT EDUCATION: Education details: PT plan of care Person educated: Patient Education method: Explanation, Demonstration, Tactile cues, Verbal cues, and Handouts Education comprehension: verbalized understanding, returned demonstration, verbal cues required, tactile cues required, and needs further education  HOME EXERCISE PROGRAM: To be updated next 1-2 visit  GOALS: Goals reviewed with patient? Yes  SHORT TERM GOALS: Target date: 07/04/2022  Patient will be independent in home exercise program to improve strength/mobility for better functional independence with ADLs  Baseline: EVAL: Patient reports mostly just walking  Goal status: INITIAL  LONG TERM GOALS: Target date: 08/14/2021  Pt will decrease 5TSTS by at least 6 seconds in order to demonstrate clinically significant  improvement in LE strength. Baseline: EVAL= 23.62 sec without UE  Goal status: INITIAL  2. Pt will improve FOTO to target score of 66 to display perceived improvements in ability to complete ADL's.  Baseline: Eval= 60 Goal status: INITIAL  3.  Pt will decrease TUG to below 14 seconds/decrease in order to demonstrate decreased fall risk. Baseline: Eval= 20.14 sec without AD Goal status: INITIAL  4.  Patient will increase six minute walk test distance to >1200 ft for improve gait ability and return to PLOF to complete her walks in her neighborhood.  Baseline: EVAL= 915 feet without an AD Goal status: INITIAL 5. Pt will increase 10MWT by at least 0.13 m/s in order to demonstrate clinically significant improvement in community ambulation.    Baseline: Eval = 0.70 m/s  Goal status: INITIAL  ASSESSMENT:  CLINICAL IMPRESSION: Patient presents with good motivation and continues to progress well with all static and dynamic balance activities. She was challenged to slow down while performing balance to improve her single leg  balance- Will benefit from continued higher level balance training in future visits.  Pt will benefit from skilled PT services to improve her overall left LE muscle strength and improve her mobility to decrease her fall risk and improve her overall quality of life.   OBJECTIVE IMPAIRMENTS: Abnormal gait, decreased activity tolerance, decreased balance, decreased coordination, decreased endurance, decreased mobility, difficulty walking, and decreased strength.   ACTIVITY LIMITATIONS: carrying, lifting, bending, standing, squatting, stairs, and transfers  PARTICIPATION LIMITATIONS: cleaning, laundry, driving, shopping, community activity, and yard work  PERSONAL FACTORS: 3+ comorbidities: OA, HTN, hx of previous CVA  are also affecting patient's functional outcome.   REHAB POTENTIAL: Good  CLINICAL DECISION MAKING: Evolving/moderate complexity  EVALUATION COMPLEXITY:  Moderate  PLAN:  PT FREQUENCY: 1-2x/week  PT DURATION: 12 weeks  PLANNED INTERVENTIONS: Therapeutic exercises, Therapeutic activity, Neuromuscular re-education, Balance training, Gait training, Patient/Family education, Self Care, Joint mobilization, Joint manipulation, Stair training, Vestibular training, Canalith repositioning, DME instructions, Dry Needling, Electrical stimulation, Spinal manipulation, Spinal mobilization, Cryotherapy, Moist heat, Taping, and Manual therapy  PLAN FOR NEXT SESSION: Progressive balance training and LE strengthening and add to HEP as appropriate next session.   Lewis Moccasin PT' Physical Therapist- [facility]  06/18/2022, 11:53 AM

## 2022-06-19 NOTE — Therapy (Signed)
OUTPATIENT OCCUPATIONAL THERAPY NEURO TREATMENT NOTE           Patient Name: Teresa [name] MRN: [id number] DOB:1940/04/17, 82 y.o., female Today's Date: 05/23/2022   PCP: Dr. [name] REFERRING PROVIDER: Dr. [name]   OT End of Session - 06/19/22 [place]     Visit Number [id number]    Number of Visits 117    Date for OT Re-Evaluation 07/18/22    Authorization Time Period Reporting period beginning 05/21/22    Progress Note Due on Visit 10    OT Start Time 1100    OT Stop Time 1145    OT Time Calculation (min) 45 min    Equipment Utilized During Treatment cane    Activity Tolerance Patient tolerated treatment well    Behavior During Therapy WFL for tasks assessed/performed             Past Medical History:  Diagnosis Date   Cancer ([place])    skin   Hypothyroidism    Raynaud's disease    Stroke [facility])    Past Surgical History:  Procedure Laterality Date   [place]  2010   Cervical fusion C4-5-6-7   CATARACT EXTRACTION, BILATERAL Bilateral 10/2016   CHOLECYSTECTOMY  1995   COLONOSCOPY WITH PROPOFOL N/A 12/08/2018   Procedure: COLONOSCOPY WITH PROPOFOL;  Surgeon: Toledo, Benay Pike, MD;  Location: [facility] ENDOSCOPY;  Service: Gastroenterology;  Laterality: N/A;   EYE SURGERY     Patient Active Problem List   Diagnosis Date Noted   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   Stroke ([place]) 02/27/2021   History of nonmelanoma skin cancer 05/23/2014   ONSET DATE: 02/26/2021  REFERRING DIAG: R MCA CVA  THERAPY DIAG:  Muscle weakness (generalized)  Other lack of coordination  Right middle cerebral artery stroke [facility])  Rationale for Evaluation and Treatment Rehabilitation  PERTINENT HISTORY: February 26, 2021, pt reports she had a CVA, came to [facility] to ER and then was transferred to [facility] in [place] where she was admitted and after acute care she went to inpatient rehab.   Following inpt rehab, pt went home and had home health.   PRECAUTIONS: fall  SUBJECTIVE: Pt reports doing well today.  PAIN:  Are you having pain? 0  OBJECTIVE:  L grip 6, R grip 41 L lateral pinch 5, R 13 L 3 point pinch 4, R 14  L 9 hole 1 min 43 sec. L shoulder active flexion 0-105, passive 0-125 02/05/22: L grip 13#  L lateral pinch 8#  3 point pinch 5#  L shoulder flex 0-108 03/12/22:  L grip: 12# L lateral pinch: 8# 3 point pinch: 6# L 9 hole: 1 min 3 sec L shoulder flexion 0-110 04/16/22: L grip: 3# L lateral pinch: 4# 3 point pinch: difficulty maintaining 3 point pinch  L 9 hole: 5 pegs in 2 min 36 sec  L shoulder flexion 0-107 BP L 132/79 HR 80 02 sats 96% 04/25/22: L grip: 14#; R grip: 50# L lateral 2#, R lateral 17# 3 point pinch: L 4#, R 14# (Saehan pinch gauge) 9 hole peg test: 1 min, 48 sec L shoulder flex 125 (R shoulder 132 active) 05/21/22: L grip: 15#; R grip: 50# L lateral 8#, R lateral 20# 3 point pinch: L 4#, R 12# (Saehan pinch gauge) 9 hole peg test: 1 min, 23 sec  TODAY'S TREATMENT: Therapeutic Exercise: Facilitated L grip strengthening with  hand gripper set at 20 # of resistance to complete 3 sets 10 reps.  Min vc to ensure SF was engaged with each grip squeeze.  Facilitated L forearm, wrist, and hand strengthening with participation in [place] board tools.  Pt worked with long handled tool to facilitate L wrist flex/ext, large base key turn, and small and large dial turn, x3 reps for each tool (up/down board=1 rep).  Rest breaks between sets and intermittent min A to keep tools level of velcro strip.  Min-mod vc to improve technique to achieve larger rotations with each tool.   Neuro re-ed: Facilitated L hand FMC/dexterity skills working to place ball pegs into pegboard using L hand.  Peg board placed on an incline to challenge forward reaching.  Pt practiced reaching with an extended elbow rather than leaning forward from her torso to reach for a  peg.  Min-mod vc to achieve this technique.  PATIENT EDUCATION: Education details: FMC/dexterity skills Person educated: pt Education method: Merchandiser, retail cues, demo Education comprehension: verbalized understanding, returned demo with tactile and vc   HOME EXERCISE PROGRAM Continue to engage LUE into ADLs; continue use of putty for gripping and pinching exercises for LUE, and L shoulder AROM/AAROM, crocheting, typing; increase participation in IADL tasks for greater use of L hand.    OT Long Term Goals - 06/06/22 (6 weeks)      OT LONG TERM GOAL #1   Title Pt will be independent with home exercise program.    Baseline Eval: no current program, 10th visit:  continue to add new exercises as pt progresses, 20th:  continue to update HEP; 08/17/21: continue to progress HEP when indicated.  Visit 40: adding new exercises as pt progresses; 12/25/21: ongoing with progressions; 03/12/22: inconsistent use of putty but regular use of stress ball (every other day).  Encouraged pt return to daily putty use (2x daily when able) to target grip and pinch strengthening; 04/16/22: not reviewed today d/t OT sent pt to ED; 04/25/22: added pulleys and pt has re-started putty; 05/21/22: stopped pulleys and added stronger theraputty and encouraged pt crochet often    Time 12    Period Weeks    Status On-going    Target Date 06/06/22     OT LONG TERM GOAL #2   Title Pt will complete UB and LB dressing with modified independence including buttons, snaps and zippers.    Baseline requires min assist at eval, 10th visit: occasional assist with buttons, 20th:  able to perform one handed, but difficulty with bilateral UE; 08/17/21: pt reports inconsistent with 1 hand, reviewed techniques this visit, Visit 40:  Pt requires assist with bra   Time 12    Period Weeks    Status MET   Target Date 11/08/21     OT LONG TERM GOAL #3   Title Pt will perform shower transfer with modified independence.    Baseline Pt  requires supervision to min assist for shower transfer at home. 10th visit: supervision; 08/17/21: supv .  Visit 40:  Pt had met goal but had recent fall and now requiring min guard to supv   Time 6    Period Weeks    Status  Met    Target Date 11/08/21      OT LONG TERM GOAL #4   Title Pt will improve L hand grip to 20 or more #s to enable pt to open a new jar.   Baseline no grip in left hand at eval, 10th  visit:  improved flexion but still working towards composite fisting and grip. 20th:  continues to demo decreased grip; 08/17/21: active digit flexion improving, but not yet able to register grip on dynamometer; 09/04/21: L grip 1# 7/17:  5#; 12/25/21: L grip 6#; 02/05/22: 13#; 03/12/22: L grip 12#; 1/23//24: L grip 3# (decreased); 04/25/22: L grip 14#; spouse assists to open new jars; 05/21/22: L grip 15#   Time 12    Period Weeks    Status Revised   Target Date 07/18/22     OT LONG TERM GOAL #5   Title Pt will improve left shoulder flexion to 100 degrees or better to improve reaching to obtain self care items from shelf/shoulder height.    Baseline difficulty with reach, shoulder flexion to 47 degrees; 08/17/21: L shoulder flexion 85, but not yet able to consistently hold ADL supplies in L hand when reaching; 09/04/21: flexion 85 7/17:  shoulder flexion to 90; 12/25/21: 105 (P 125); 03/12/22: active L shoulder flexion 110 (pt uses R arm to reach for items at shoulder height or above); 04/16/22: L shoulder flexion 107; 04/25/22: L shoulder flexion 125*, R 132*    Time 12    Period Weeks    Status achieved   Target Date 04/25/22     OT LONG TERM GOAL #6   Title Pt will improve FOTO score to 47 or above to demonstrate a clinically relevant change in function to impact ADL tasks.    Baseline score of 30 at eval; 08/17/21: FOTO: 42; 09/04/21: FOTO : 54 , FOTO 7/7:  60; 12/25/21: 59; 03/12/22: 66; 04/16/22: 67; 04/25/22; 51; 05/21/22: 74   Time 12    Period Weeks    Status  MET/ongoing    Target Date 04/25/22      OT LONG TERM GOAL #7   Title Pt will demonstrate ability to pick up small objects and complete 9 hole peg test in less than 1 min 30 sec (revised from under 2 min)   Baseline unable to perform at eval, 10th visit: still unable to pick up small pegs, 20th: unable to pick up pegs but improving; 08/17/21: not attempted d/t time constraints, will assess next visit; 09/04/21: 9 hole peg test in 5 min 53 sec;  7/17:  Pt unable to complete this date but was able to place 6 of 9 pegs in 4 mins 20 secs; 12/25/21: L 1 min 43 sec; 03/12/22: L 1 min 2 sec (requires non-skid surface to pick up small items from table top); 04/16/22: 5 pegs in 2 min 36 secs; 04/25/22: 1 min 48 sec; 05/21/22: L 1 min 23 sec   Time 12    Period Weeks    Status Revised/On-going    Target Date 07/18/22    OT LONG TERM GOAL #8   Title Pt will increase LUE strength by 1 MM grade in order to hold blow dryer in LUE to dry hair without dropping dryer.   Baseline 04/25/22: Unable to sustain grip and maintain lift of LUE with hand near head without dropping dryer.  Dryer has caused bruising above L eye with previous attempts.  L shoulder flex/abd 3/5, elbow flex/ext 3+/5    Time 12    Period Weeks    Status New    Target Date 07/18/22        OT LONG TERM GOAL #9   Title Pt will increase L hand dexterity to enable pt to independently manage clothing fasteners.   Baseline 04/25/22: pt  ties shoe laces loosely and requires assist from spouse occasionally.  When typing, pt engages the L hand for ~25% of the task.  Pt avoids wearing jeans d/t inability to manage button or zipper.     Time 12    Period Weeks    Status New    Target Date 07/18/22        OT LONG TERM GOAL #10   Title Pt will be able to independently carry a light plate of food or drink in L hand without spilling/dropping to increase efficiency with item transport in the kitchen.   Baseline 04/25/22: Pt requires constant cues to keep cup or plate level while multitasking in order to  prevent spilling.   Time 12    Period Weeks    Status New    Target Date 07/18/22     Plan -     Clinical Impression Statement Pt continues to focus on L hand strengthening and coordination skills.  Pt practiced reaching with an extended elbow rather than leaning into table from her torso to reach to place and remove ball pegs from pegboard using LUE.  Pt improving with ability to reposition pegs within fingertips in prep for placement into pegboard.  Pt will continue to work towards goals in plan of care to improve left UE functional use in ADL and IADL tasks at home and in the community.    OT Occupational Profile and History Detailed Assessment- Review of Records and additional review of physical, cognitive, psychosocial history related to current functional performance    Occupational performance deficits (Please refer to evaluation for details): ADL's;IADL's;Leisure;Rest and Sleep    Body Structure / Function / Physical Skills ADL;Coordination;Endurance;GMC;UE functional use;Balance;IADL;Pain;Dexterity;FMC;Strength;Edema;Mobility;ROM    Psychosocial Skills Environmental  Adaptations;Habits;Routines and Behaviors    Rehab Potential Good    Clinical Decision Making Several treatment options, min-mod task modification necessary   Comorbidities Affecting Occupational Performance: May have comorbidities impacting occupational performance    Modification or Assistance to Complete Evaluation  Min-Moderate modification of tasks or assist with assess necessary to complete eval    OT Frequency 2x / week    OT Duration 12 weeks    OT Treatment/Interventions Self-care/ADL training;Cryotherapy;Paraffin;Therapeutic exercise;DME and/or AE instruction;Functional Mobility Training;Balance training;Electrical Stimulation;Ultrasound;Neuromuscular education;Manual Therapy;Splinting;Moist Heat;Contrast Bath;Passive range of motion;Therapeutic activities;Patient/family education;Coping strategies training    Plan  OT recert    Consulted and Agree with Plan of Care Patient           Leta Speller, MS, OTR/L  Darleene Cleaver, OT 06/19/2022, 6:40 PM

## 2022-06-21 ENCOUNTER — Ambulatory Visit: Payer: Medicare PPO

## 2022-06-21 DIAGNOSIS — I63511 Cerebral infarction due to unspecified occlusion or stenosis of right middle cerebral artery: Secondary | ICD-10-CM

## 2022-06-21 DIAGNOSIS — M6281 Muscle weakness (generalized): Secondary | ICD-10-CM

## 2022-06-21 DIAGNOSIS — R278 Other lack of coordination: Secondary | ICD-10-CM

## 2022-06-21 DIAGNOSIS — R2681 Unsteadiness on feet: Secondary | ICD-10-CM

## 2022-06-21 DIAGNOSIS — R262 Difficulty in walking, not elsewhere classified: Secondary | ICD-10-CM

## 2022-06-21 NOTE — Therapy (Signed)
OUTPATIENT OCCUPATIONAL THERAPY NEURO TREATMENT NOTE           Patient Name: Teresa [name] MRN: [id number] DOB:[birth year], 82 y.o., female Today's Date: 05/23/2022  PCP: Dr. [name] REFERRING PROVIDER: Dr. [name]   OT End of Session - 06/21/22 1104     Visit Number 107    Number of Visits 117    Date for OT Re-Evaluation 07/18/22    Authorization Time Period Reporting period beginning 05/21/22    Progress Note Due on Visit 10    OT Start Time 1015    OT Stop Time 1100    OT Time Calculation (min) 45 min    Equipment Utilized During Treatment cane    Activity Tolerance Patient tolerated treatment well    Behavior During Therapy WFL for tasks assessed/performed             Past Medical History:  Diagnosis Date   Cancer ([place])    skin   Hypothyroidism    Raynaud's disease    Stroke [facility])    Past Surgical History:  Procedure Laterality Date   [place]  2010   Cervical fusion C4-5-6-7   CATARACT EXTRACTION, BILATERAL Bilateral 10/2016   CHOLECYSTECTOMY  1995   COLONOSCOPY WITH PROPOFOL N/A 12/08/2018   Procedure: COLONOSCOPY WITH PROPOFOL;  Surgeon: Toledo, Benay Pike, MD;  Location: [facility] ENDOSCOPY;  Service: Gastroenterology;  Laterality: N/A;   EYE SURGERY     Patient Active Problem List   Diagnosis Date Noted   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   Stroke ([place]) 02/27/2021   History of nonmelanoma skin cancer 05/23/2014   ONSET DATE: 02/26/2021  REFERRING DIAG: R MCA CVA  THERAPY DIAG:  Muscle weakness (generalized)  Right middle cerebral artery stroke [facility])  Other lack of coordination  Rationale for Evaluation and Treatment Rehabilitation  PERTINENT HISTORY: February 26, 2021, pt reports she had a CVA, came to [facility] to ER and then was transferred to [facility] in [place] where she was admitted and after acute care she went to inpatient rehab.  Following  inpt rehab, pt went home and had home health.   PRECAUTIONS: fall  SUBJECTIVE: Pt planning to have Easter dinner at her daughter's home over the weekend.  PAIN:  Are you having pain? 0  OBJECTIVE:  L grip 6, R grip 41 L lateral pinch 5, R 13 L 3 point pinch 4, R 14  L 9 hole 1 min 43 sec. L shoulder active flexion 0-105, passive 0-125 02/05/22: L grip 13#  L lateral pinch 8#  3 point pinch 5#  L shoulder flex 0-108 03/12/22:  L grip: 12# L lateral pinch: 8# 3 point pinch: 6# L 9 hole: 1 min 3 sec L shoulder flexion 0-110 04/16/22: L grip: 3# L lateral pinch: 4# 3 point pinch: difficulty maintaining 3 point pinch  L 9 hole: 5 pegs in 2 min 36 sec  L shoulder flexion 0-107 BP L 132/79 HR 80 02 sats 96% 04/25/22: L grip: 14#; R grip: 50# L lateral 2#, R lateral 17# 3 point pinch: L 4#, R 14# (Saehan pinch gauge) 9 hole peg test: 1 min, 48 sec L shoulder flex 125 (R shoulder 132 active) 05/21/22: L grip: 15#; R grip: 50# L lateral 8#, R lateral 20# 3 point pinch: L 4#, R 12# (Saehan pinch gauge) 9 hole peg test: 1 min, 23 sec  TODAY'S TREATMENT:  Therapeutic Exercise: Facilitated L grip strengthening with hand gripper set at 20 # of resistance to complete 3 sets 10 reps.  Facilitated LUE strengthening with use of yellow theraband to complete L shoulder horiz abd, L shoulder flex, IR/ER with elbow at side, elbow flex, and elbow extension for 2 sets 10 reps each.  OT provided stability to maintain L elbow extension for horiz abd and shoulder flexion to maintain form.  Used 2# dumbbell for resistive L forearm pron/sup, requiring intermittent vc to keep eyes on L hand to maximize ROM with each rep, L wrist flex, ext, and RD/UD for 2 sets 10 reps each.  OT provided stability at L forearm and elbow to maintain form during forearm and wrist strengthening.    Neuro re-ed: Facilitated L hand FMC/dexterity skills working to place and remove Jamar pegs from pegboard.  Pegs were placed  on a washcloth on table top to provide easier pick up with a non-skid surface as pt struggled to pick up pegs from side of dish.  Pt first required warm up of removing pegs that were placed by OT, then pt practiced moving pegs from 1 row to another.  After this warm up, pt was able to successfully pick up pegs from washcloth and place several into pegboard.    PATIENT EDUCATION: Education details: FMC/dexterity skills, LUE resistive exercises Person educated: pt Education method: Merchandiser, retail cues, demo Education comprehension: verbalized understanding, returned demo with tactile and vc   HOME EXERCISE PROGRAM Continue to engage LUE into ADLs; continue use of putty for gripping and pinching exercises for LUE, and L shoulder AROM/AAROM, crocheting, typing; increase participation in IADL tasks for greater use of L hand.    OT Long Term Goals - 06/06/22 (6 weeks)      OT LONG TERM GOAL #1   Title Pt will be independent with home exercise program.    Baseline Eval: no current program, 10th visit:  continue to add new exercises as pt progresses, 20th:  continue to update HEP; 08/17/21: continue to progress HEP when indicated.  Visit 40: adding new exercises as pt progresses; 12/25/21: ongoing with progressions; 03/12/22: inconsistent use of putty but regular use of stress ball (every other day).  Encouraged pt return to daily putty use (2x daily when able) to target grip and pinch strengthening; 04/16/22: not reviewed today d/t OT sent pt to ED; 04/25/22: added pulleys and pt has re-started putty; 05/21/22: stopped pulleys and added stronger theraputty and encouraged pt crochet often    Time 12    Period Weeks    Status On-going    Target Date 06/06/22     OT LONG TERM GOAL #2   Title Pt will complete UB and LB dressing with modified independence including buttons, snaps and zippers.    Baseline requires min assist at eval, 10th visit: occasional assist with buttons, 20th:  able to perform one  handed, but difficulty with bilateral UE; 08/17/21: pt reports inconsistent with 1 hand, reviewed techniques this visit, Visit 40:  Pt requires assist with bra   Time 12    Period Weeks    Status MET   Target Date 11/08/21     OT LONG TERM GOAL #3   Title Pt will perform shower transfer with modified independence.    Baseline Pt requires supervision to min assist for shower transfer at home. 10th visit: supervision; 08/17/21: supv .  Visit 40:  Pt had met goal but had recent fall and now requiring  min guard to supv   Time 6    Period Weeks    Status  Met    Target Date 11/08/21      OT LONG TERM GOAL #4   Title Pt will improve L hand grip to 20 or more #s to enable pt to open a new jar.   Baseline no grip in left hand at eval, 10th visit:  improved flexion but still working towards composite fisting and grip. 20th:  continues to demo decreased grip; 08/17/21: active digit flexion improving, but not yet able to register grip on dynamometer; 09/04/21: L grip 1# 7/17:  5#; 12/25/21: L grip 6#; 02/05/22: 13#; 03/12/22: L grip 12#; 1/23//24: L grip 3# (decreased); 04/25/22: L grip 14#; spouse assists to open new jars; 05/21/22: L grip 15#   Time 12    Period Weeks    Status Revised   Target Date 07/18/22     OT LONG TERM GOAL #5   Title Pt will improve left shoulder flexion to 100 degrees or better to improve reaching to obtain self care items from shelf/shoulder height.    Baseline difficulty with reach, shoulder flexion to 47 degrees; 08/17/21: L shoulder flexion 85, but not yet able to consistently hold ADL supplies in L hand when reaching; 09/04/21: flexion 85 7/17:  shoulder flexion to 90; 12/25/21: 105 (P 125); 03/12/22: active L shoulder flexion 110 (pt uses R arm to reach for items at shoulder height or above); 04/16/22: L shoulder flexion 107; 04/25/22: L shoulder flexion 125*, R 132*    Time 12    Period Weeks    Status achieved   Target Date 04/25/22     OT LONG TERM GOAL #6   Title Pt will  improve FOTO score to 47 or above to demonstrate a clinically relevant change in function to impact ADL tasks.    Baseline score of 30 at eval; 08/17/21: FOTO: 42; 09/04/21: FOTO : 54 , FOTO 7/7:  60; 12/25/21: 59; 03/12/22: 66; 04/16/22: 67; 04/25/22; 51; 05/21/22: 74   Time 12    Period Weeks    Status  MET/ongoing    Target Date 04/25/22     OT LONG TERM GOAL #7   Title Pt will demonstrate ability to pick up small objects and complete 9 hole peg test in less than 1 min 30 sec (revised from under 2 min)   Baseline unable to perform at eval, 10th visit: still unable to pick up small pegs, 20th: unable to pick up pegs but improving; 08/17/21: not attempted d/t time constraints, will assess next visit; 09/04/21: 9 hole peg test in 5 min 53 sec;  7/17:  Pt unable to complete this date but was able to place 6 of 9 pegs in 4 mins 20 secs; 12/25/21: L 1 min 43 sec; 03/12/22: L 1 min 2 sec (requires non-skid surface to pick up small items from table top); 04/16/22: 5 pegs in 2 min 36 secs; 04/25/22: 1 min 48 sec; 05/21/22: L 1 min 23 sec   Time 12    Period Weeks    Status Revised/On-going    Target Date 07/18/22    OT LONG TERM GOAL #8   Title Pt will increase LUE strength by 1 MM grade in order to hold blow dryer in LUE to dry hair without dropping dryer.   Baseline 04/25/22: Unable to sustain grip and maintain lift of LUE with hand near head without dropping dryer.  Dryer has caused bruising above  L eye with previous attempts.  L shoulder flex/abd 3/5, elbow flex/ext 3+/5    Time 12    Period Weeks    Status New    Target Date 07/18/22        OT LONG TERM GOAL #9   Title Pt will increase L hand dexterity to enable pt to independently manage clothing fasteners.   Baseline 04/25/22: pt ties shoe laces loosely and requires assist from spouse occasionally.  When typing, pt engages the L hand for ~25% of the task.  Pt avoids wearing jeans d/t inability to manage button or zipper.     Time 12    Period Weeks     Status New    Target Date 07/18/22        OT LONG TERM GOAL #10   Title Pt will be able to independently carry a light plate of food or drink in L hand without spilling/dropping to increase efficiency with item transport in the kitchen.   Baseline 04/25/22: Pt requires constant cues to keep cup or plate level while multitasking in order to prevent spilling.   Time 12    Period Weeks    Status New    Target Date 07/18/22     Plan -     Clinical Impression Statement Pt tolerated all therapeutic exercises well this date.  When using theraband for L shoulder planes, pt required OT assist to stabilize L elbow to maintain elbow extension during horiz abd and shoulder flexion exercises to isolate shoulder.  When using dumbbell for wrist and forearm strengthening, pt required OT to stabilize L elbow to isolate forearm pron/sup, and assist to stabilize the forearm during wrist strengthening exercises.  Pt is progressing well with L hand FMC/dexterity skills, and pt was able to attempt a more advanced activity working with small Jamar pegs and pegboard.  Pegs were placed on a washcloth on table top to provide easier pick up with a non-skid surface as pt struggled to pick up pegs from side of dish.  Pt first required warm up of removing pegs that were placed by OT, then pt practiced moving pegs from 1 row to another.  After this warm up, pt was able to successfully pick up pegs from washcloth and place several into pegboard.  Pt will continue to work towards goals in plan of care to improve left UE functional use in ADL and IADL tasks at home and in the community.    OT Occupational Profile and History Detailed Assessment- Review of Records and additional review of physical, cognitive, psychosocial history related to current functional performance    Occupational performance deficits (Please refer to evaluation for details): ADL's;IADL's;Leisure;Rest and Sleep    Body Structure / Function / Physical Skills  ADL;Coordination;Endurance;GMC;UE functional use;Balance;IADL;Pain;Dexterity;FMC;Strength;Edema;Mobility;ROM    Psychosocial Skills Environmental  Adaptations;Habits;Routines and Behaviors    Rehab Potential Good    Clinical Decision Making Several treatment options, min-mod task modification necessary   Comorbidities Affecting Occupational Performance: May have comorbidities impacting occupational performance    Modification or Assistance to Complete Evaluation  Min-Moderate modification of tasks or assist with assess necessary to complete eval    OT Frequency 2x / week    OT Duration 12 weeks    OT Treatment/Interventions Self-care/ADL training;Cryotherapy;Paraffin;Therapeutic exercise;DME and/or AE instruction;Functional Mobility Training;Balance training;Electrical Stimulation;Ultrasound;Neuromuscular education;Manual Therapy;Splinting;Moist Heat;Contrast Bath;Passive range of motion;Therapeutic activities;Patient/family education;Coping strategies training    Plan OT recert    Consulted and Agree with Plan of Care Patient  Leta Speller, MS, OTR/L  Darleene Cleaver, OT 06/21/2022, 11:05 AM

## 2022-06-21 NOTE — Therapy (Signed)
OUTPATIENT PHYSICAL THERAPY NEURO TREATMENT   Patient Name: Teresa [name] MRN: [id number] DOB:10/22/1940, 82 y.o., female Today's Date: 06/21/2022   PCP: Dr. [name] REFERRING PROVIDER: Dr. [name]  END OF SESSION:  PT End of Session - 06/21/22 0927     Visit Number 6    Number of Visits 24    Date for PT Re-Evaluation 08/15/22    Authorization Time Period 05/23/2022- 08/15/2022    Progress Note Due on Visit 10    PT Start Time 0930    PT Stop Time 1014    PT Time Calculation (min) 44 min    Equipment Utilized During Treatment Gait belt    Activity Tolerance Patient tolerated treatment well    Behavior During Therapy WFL for tasks assessed/performed                Past Medical History:  Diagnosis Date   Cancer ([place])    skin   Hypothyroidism    Raynaud's disease    Stroke [facility])    Past Surgical History:  Procedure Laterality Date   [place]  2010   Cervical fusion C4-5-6-7   CATARACT EXTRACTION, BILATERAL Bilateral 10/2016   CHOLECYSTECTOMY  1995   COLONOSCOPY WITH PROPOFOL N/A 12/08/2018   Procedure: COLONOSCOPY WITH PROPOFOL;  Surgeon: Toledo, Benay Pike, MD;  Location: [facility] ENDOSCOPY;  Service: Gastroenterology;  Laterality: N/A;   EYE SURGERY     Patient Active Problem List   Diagnosis Date Noted   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   Stroke ([place]) 02/27/2021   History of nonmelanoma skin cancer 05/23/2014    ONSET DATE: 04/16/2022  REFERRING DIAG: 69.354 (ICD-10-CM) - Hemiplegia and hemiparesis following cerebral infarction affecting left non-dominant side   THERAPY DIAG:  Muscle weakness (generalized)  Right middle cerebral artery stroke ([place])  Unsteadiness on feet  Difficulty in walking, not elsewhere classified  Other lack of coordination  Rationale for Evaluation and Treatment: Rehabilitation  SUBJECTIVE:                                                                                                                                                                                              SUBJECTIVE STATEMENT: Patient reports feeling good and has been practicing with her single leg stance and up to 11 sec at home.     Pt accompanied by: significant other  PERTINENT HISTORY: Pt is a 82 y.o. female with referral for hemiplegia with left sided weakness -original CVA on 02/26/21 and new onset of left sided weakness on 04/16/2022.  R MCA CVA on 02/26/21. Pt completed outpatient PT on 01/17/2022 with majority of goals met. PMH includes: skin cancer, hypothyroidism, and Raynaud's disease, history of cervical fusion in 2010 C4-C7.   PAIN:  Are you having pain? No  PRECAUTIONS: Fall  WEIGHT BEARING RESTRICTIONS: No  FALLS: Has patient fallen in last 6 months? No  LIVING ENVIRONMENT: Lives with: lives with their spouse Lives in: House/apartment Stairs: Yes: External: 1 steps; none Has following equipment at home: Single point cane, Walker - 2 wheeled, shower chair, and Grab bars  PLOF: Independent  PATIENT GOALS: I want to walk without this cane and no falls and enjoy going out to grandkids sporting event  OBJECTIVE:   DIAGNOSTIC FINDINGS: CLINICAL DATA:  Stroke suspected   EXAM: MRI HEAD WITHOUT CONTRAST   TECHNIQUE: Multiplanar, multiecho pulse sequences of the brain and surrounding structures were obtained without intravenous contrast.   COMPARISON:  MRI Head 02/26/21, CT Head 04/16/22   FINDINGS: Brain: Evolving right MCA territory infarct involving the right frontal operculum in the right insular cortex. No acute infarction, hemorrhage, hydrocephalus, extra-axial collection or mass lesion. Small focus of microhemorrhage in the left temporal lobe. There is petechial hemorrhage and/or hemosiderosis in the region of prior right MCA territory infarct. T2/stir hyperintense signal in the right cerebral peduncle is favored to  represent transneuronal degeneration.   Vascular: Major flow voids are preserved.   Skull and upper cervical spine: Normal marrow signal.   Sinuses/Orbits: Bilateral lens replacement. No mastoid or middle ear effusion. Paranasal sinuses are clear.   Other: None.   IMPRESSION: 1. No acute intracranial abnormality. 2. 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 3.     Electronically Signed   By: Marin Roberts M.D.   On: 04/16/2022 13:41  COGNITION: Overall cognitive status: Within functional limits for tasks assessed   SENSATION: WFL  COORDINATION: Intact with each LE  EDEMA:  None observed  MUSCLE TONE: LLE: Within functional limits  DTRs:  Patella 2+ = Normal and Achilles 2+ = Normal  POSTURE: rounded shoulders and forward head  LOWER EXTREMITY ROM:     Active  Right Eval Left Eval  Hip flexion WNL THROUGHOUT LE WNL THROUGHOUT LE  Hip extension    Hip abduction    Hip adduction    Hip internal rotation    Hip external rotation    Knee flexion    Knee extension    Ankle dorsiflexion    Ankle plantarflexion    Ankle inversion    Ankle eversion     (Blank rows = not tested)  LOWER EXTREMITY MMT:    MMT Right Eval Left Eval  Hip flexion 4+ 4  Hip extension 4+ 4  Hip abduction 4+ 4  Hip adduction 4+ 4  Hip internal rotation 4+ 4  Hip external rotation 4+ 4  Knee flexion 4+ 4  Knee extension 5 4  Ankle dorsiflexion 5 4  Ankle plantarflexion    Ankle inversion    Ankle eversion    (Blank rows = not tested)  BED MOBILITY:  Patient reports independent with all bed mobility and states has returned to master bed  TRANSFERS: Assistive device utilized: None  Sit to stand: Complete Independence Stand to sit: Complete Independence Chair to chair: Complete Independence Floor:  Not tested   GAIT: Gait pattern: step through pattern, decreased arm swing- Right, decreased arm swing- Left,  decreased step length- Right, and decreased step length- Left  Distance walked: 915 feet Assistive device utilized: None Level of assistance: SBA Comments: occasional scuff or decreased left foot clearance  FUNCTIONAL TESTS:  5 times sit to stand: 23.62 sec without UE support Timed up and go (TUG): 20.14 sec without an AD 10 meter walk test: 0.70 m/s  PATIENT SURVEYS:  FOTO 60/ goal of 66  TODAY'S TREATMENT:                                                                                                                              DATE: 06/21/2022  Neuro Re-Ed:    Tandem gait in // bars down and back x 6 (near tandem- left LE slightly wide)  Dynamic High knee march- in // bars x 3 (VC to perform slow and max height on knee raise)  Dynamic forward/backward step over 1/2 foam with 3 # x 15 reps Dynamic side stepping in // bars over 1/2 foam with 3# x 15 reps SLS attempts- up to 11 sec each LE with multiple trials ranging from 2-11 sec   Activity Description: The Blaze Pod Random setting was chosen to enhance cognitive processing and agility, providing an unpredictable environment to simulate real-world scenarios, and fostering quick reactions and adaptability.   Activity Setting:  Random Number of Pods:  6 Cycles/Sets:  3 Duration (Time or Hit Count):  10   Patient Stats  Reaction Time:  1) 48 sec 2) 43 3) 54 sec   Therex:   Lunge squat walk down and back x 4 (patient reports tough on the thighs)   STS  with airex pad under right LE x 12 reps without UE support  Calf raises on 1/2 foam 3# AW 2 sets of 12 reps  Toe raises on 1/2 foam 3# AW 2 sets of 12 reps    PATIENT EDUCATION: Education details: PT plan of care Person educated: Patient Education method: Explanation, Demonstration, Tactile cues, Verbal cues, and Handouts Education comprehension: verbalized understanding, returned demonstration, verbal cues required, tactile cues required, and needs further  education  HOME EXERCISE PROGRAM: To be updated next 1-2 visit  GOALS: Goals reviewed with patient? Yes  SHORT TERM GOALS: Target date: 07/04/2022  Patient will be independent in home exercise program to improve strength/mobility for better functional independence with ADLs  Baseline: EVAL: Patient reports mostly just walking  Goal status: INITIAL  LONG TERM GOALS: Target date: 08/14/2021  Pt will decrease 5TSTS by at least 6 seconds in order to demonstrate clinically significant improvement in LE strength. Baseline: EVAL= 23.62 sec without UE  Goal status: INITIAL  2. Pt will improve FOTO to target score of 66 to display perceived improvements in ability to complete ADL's.  Baseline: Eval= 60 Goal status: INITIAL  3.  Pt will decrease TUG to below 14 seconds/decrease in order to demonstrate decreased fall risk. Baseline: Eval= 20.14 sec without AD Goal status: INITIAL  4.  Patient will increase six minute walk test distance to >  1200 ft for improve gait ability and return to PLOF to complete her walks in her neighborhood.  Baseline: EVAL= 915 feet without an AD Goal status: INITIAL 5. Pt will increase 10MWT by at least 0.13 m/s in order to demonstrate clinically significant improvement in community ambulation.    Baseline: Eval = 0.70 m/s  Goal status: INITIAL  ASSESSMENT:  CLINICAL IMPRESSION: Patient demonstrated good ability with all balance activities today- able to demo much improved tandem and SLS ability today. She was challenged with sit to stand with emphasis on left LE and later with blaze pod activity- slight reduced reaction time yet no LOB.  Pt will benefit from skilled PT services to improve her overall left LE muscle strength and improve her mobility to decrease her fall risk and improve her overall quality of life.   OBJECTIVE IMPAIRMENTS: Abnormal gait, decreased activity tolerance, decreased balance, decreased coordination, decreased endurance, decreased  mobility, difficulty walking, and decreased strength.   ACTIVITY LIMITATIONS: carrying, lifting, bending, standing, squatting, stairs, and transfers  PARTICIPATION LIMITATIONS: cleaning, laundry, driving, shopping, community activity, and yard work  PERSONAL FACTORS: 3+ comorbidities: OA, HTN, hx of previous CVA  are also affecting patient's functional outcome.   REHAB POTENTIAL: Good  CLINICAL DECISION MAKING: Evolving/moderate complexity  EVALUATION COMPLEXITY: Moderate  PLAN:  PT FREQUENCY: 1-2x/week  PT DURATION: 12 weeks  PLANNED INTERVENTIONS: Therapeutic exercises, Therapeutic activity, Neuromuscular re-education, Balance training, Gait training, Patient/Family education, Self Care, Joint mobilization, Joint manipulation, Stair training, Vestibular training, Canalith repositioning, DME instructions, Dry Needling, Electrical stimulation, Spinal manipulation, Spinal mobilization, Cryotherapy, Moist heat, Taping, and Manual therapy  PLAN FOR NEXT SESSION: Progressive balance training and LE strengthening and add to HEP as appropriate next session.   Lewis Moccasin PT' Physical Therapist- [facility]  06/21/2022, 10:44 AM

## 2022-06-25 ENCOUNTER — Ambulatory Visit: Payer: Medicare PPO | Attending: Internal Medicine

## 2022-06-25 ENCOUNTER — Ambulatory Visit: Payer: Medicare PPO

## 2022-06-25 DIAGNOSIS — I63511 Cerebral infarction due to unspecified occlusion or stenosis of right middle cerebral artery: Secondary | ICD-10-CM

## 2022-06-25 DIAGNOSIS — R2681 Unsteadiness on feet: Secondary | ICD-10-CM | POA: Diagnosis present

## 2022-06-25 DIAGNOSIS — R278 Other lack of coordination: Secondary | ICD-10-CM

## 2022-06-25 DIAGNOSIS — M6281 Muscle weakness (generalized): Secondary | ICD-10-CM | POA: Insufficient documentation

## 2022-06-25 DIAGNOSIS — R2689 Other abnormalities of gait and mobility: Secondary | ICD-10-CM | POA: Insufficient documentation

## 2022-06-25 DIAGNOSIS — R262 Difficulty in walking, not elsewhere classified: Secondary | ICD-10-CM | POA: Insufficient documentation

## 2022-06-25 DIAGNOSIS — G8929 Other chronic pain: Secondary | ICD-10-CM | POA: Insufficient documentation

## 2022-06-25 DIAGNOSIS — M25512 Pain in left shoulder: Secondary | ICD-10-CM | POA: Diagnosis present

## 2022-06-25 NOTE — Therapy (Signed)
OUTPATIENT PHYSICAL THERAPY NEURO TREATMENT   Patient Name: Teresa [name] MRN: [id number] DOB:23-Feb-1941, 82 y.o., female Today's Date: 06/25/2022   PCP: Dr. [name] REFERRING PROVIDER: Dr. [name]  END OF SESSION:  PT End of Session - 06/25/22 1147     Visit Number 7    Number of Visits 24    Date for PT Re-Evaluation 08/15/22    Authorization Time Period 05/23/2022- 08/15/2022    Progress Note Due on Visit 10    PT Start Time 1146    PT Stop Time 1228    PT Time Calculation (min) 42 min    Equipment Utilized During Treatment Gait belt    Activity Tolerance Patient tolerated treatment well    Behavior During Therapy WFL for tasks assessed/performed                Past Medical History:  Diagnosis Date   Cancer    skin   Hypothyroidism    Raynaud's disease    Stroke    Past Surgical History:  Procedure Laterality Date   ABDOMINAL HYSTERECTOMY  1991   APPENDECTOMY  1991   BACK SURGERY  2010   Cervical fusion C4-5-6-7   CATARACT EXTRACTION, BILATERAL Bilateral 10/2016   CHOLECYSTECTOMY  1995   COLONOSCOPY WITH PROPOFOL N/A 12/08/2018   Procedure: COLONOSCOPY WITH PROPOFOL;  Surgeon: Toledo, Benay Pike, MD;  Location: [facility] ENDOSCOPY;  Service: Gastroenterology;  Laterality: N/A;   EYE SURGERY     Patient Active Problem List   Diagnosis Date Noted   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   Stroke 02/27/2021   History of nonmelanoma skin cancer 05/23/2014    ONSET DATE: 04/16/2022  REFERRING DIAG: 69.354 (ICD-10-CM) - Hemiplegia and hemiparesis following cerebral infarction affecting left non-dominant side   THERAPY DIAG:  Muscle weakness (generalized)  Right middle cerebral artery stroke  Other lack of coordination  Unsteadiness on feet  Difficulty in walking, not elsewhere classified  Rationale for Evaluation and Treatment: Rehabilitation  SUBJECTIVE:                                                                                                                                                                                              SUBJECTIVE STATEMENT: Patient reports she was sore and took a nap after last session of PT/OT. Reports her left shoulder is a little sore but ok otherwise.      Pt accompanied by: significant other  PERTINENT HISTORY: Pt is a 82 y.o. female with referral for hemiplegia with left sided weakness -original CVA on 02/26/21 and new onset of left sided weakness on  04/16/2022.  R MCA CVA on 02/26/21. Pt completed outpatient PT on 01/17/2022 with majority of goals met. PMH includes: skin cancer, hypothyroidism, and Raynaud's disease, history of cervical fusion in 2010 C4-C7.   PAIN:  Are you having pain? No  PRECAUTIONS: Fall  WEIGHT BEARING RESTRICTIONS: No  FALLS: Has patient fallen in last 6 months? No  LIVING ENVIRONMENT: Lives with: lives with their spouse Lives in: House/apartment Stairs: Yes: External: 1 steps; none Has following equipment at home: Single point cane, Walker - 2 wheeled, shower chair, and Grab bars  PLOF: Independent  PATIENT GOALS: I want to walk without this cane and no falls and enjoy going out to grandkids sporting event  OBJECTIVE:   DIAGNOSTIC FINDINGS: CLINICAL DATA:  Stroke suspected   EXAM: MRI HEAD WITHOUT CONTRAST   TECHNIQUE: Multiplanar, multiecho pulse sequences of the brain and surrounding structures were obtained without intravenous contrast.   COMPARISON:  MRI Head 02/26/21, CT Head 04/16/22   FINDINGS: Brain: Evolving right MCA territory infarct involving the right frontal operculum in the right insular cortex. No acute infarction, hemorrhage, hydrocephalus, extra-axial collection or mass lesion. Small focus of microhemorrhage in the left temporal lobe. There is petechial hemorrhage and/or hemosiderosis in the region of prior right MCA territory infarct. T2/stir hyperintense signal in the right cerebral peduncle is favored to  represent transneuronal degeneration.   Vascular: Major flow voids are preserved.   Skull and upper cervical spine: Normal marrow signal.   Sinuses/Orbits: Bilateral lens replacement. No mastoid or middle ear effusion. Paranasal sinuses are clear.   Other: None.   IMPRESSION: 1. No acute intracranial abnormality. 2. 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 3.     Electronically Signed   By: Marin Roberts M.D.   On: 04/16/2022 13:41  COGNITION: Overall cognitive status: Within functional limits for tasks assessed   SENSATION: WFL  COORDINATION: Intact with each LE  EDEMA:  None observed  MUSCLE TONE: LLE: Within functional limits  DTRs:  Patella 2+ = Normal and Achilles 2+ = Normal  POSTURE: rounded shoulders and forward head  LOWER EXTREMITY ROM:     Active  Right Eval Left Eval  Hip flexion WNL THROUGHOUT LE WNL THROUGHOUT LE  Hip extension    Hip abduction    Hip adduction    Hip internal rotation    Hip external rotation    Knee flexion    Knee extension    Ankle dorsiflexion    Ankle plantarflexion    Ankle inversion    Ankle eversion     (Blank rows = not tested)  LOWER EXTREMITY MMT:    MMT Right Eval Left Eval  Hip flexion 4+ 4  Hip extension 4+ 4  Hip abduction 4+ 4  Hip adduction 4+ 4  Hip internal rotation 4+ 4  Hip external rotation 4+ 4  Knee flexion 4+ 4  Knee extension 5 4  Ankle dorsiflexion 5 4  Ankle plantarflexion    Ankle inversion    Ankle eversion    (Blank rows = not tested)  BED MOBILITY:  Patient reports independent with all bed mobility and states has returned to master bed  TRANSFERS: Assistive device utilized: None  Sit to stand: Complete Independence Stand to sit: Complete Independence Chair to chair: Complete Independence Floor:  Not tested   GAIT: Gait pattern: step through pattern, decreased arm swing- Right, decreased arm swing- Left,  decreased step length- Right, and decreased step  length- Left Distance walked: 915 feet Assistive device utilized: None Level of assistance: SBA Comments: occasional scuff or decreased left foot clearance  FUNCTIONAL TESTS:  5 times sit to stand: 23.62 sec without UE support Timed up and go (TUG): 20.14 sec without an AD 10 meter walk test: 0.70 m/s  PATIENT SURVEYS:  FOTO 60/ goal of 66  TODAY'S TREATMENT:                                                                                                                              DATE: 06/25/2022  Neuro Re-Ed:  Dynamic High knee march- in // bars x 3 (VC to perform slow and max height on knee raise)  Dynamic forward/backward walking in // bars down and back x 8  Tandem gait in // bars down and back x 6 (near tandem- left LE slightly wide)  Dynamic side stepping over several obstacles in // bars ( 1/2 foam roll x 2, orange hurdle and then Green yoga block down and back x 6)  SLS attempts- up to 15 sec LLE with multiple trials ranging from 2-15 sec and up to 20 sec at best on right LE. Forward reach for cone (on top of yoga block-horizontal)-SLS on one LE-  while hip ext opp LE without UE support     Therex:   Wall slide with 5 sec hold x 12 reps  STS  without UE support x12 reps  Toe walk - down and back in //bars x 3  Heel walk- down and back in // bars x 3  Wide side steps - in // bars x 3 - down and back     PATIENT EDUCATION: Education details: PT plan of care Person educated: Patient Education method: Explanation, Demonstration, Tactile cues, Verbal cues, and Handouts Education comprehension: verbalized understanding, returned demonstration, verbal cues required, tactile cues required, and needs further education  HOME EXERCISE PROGRAM: To be updated next 1-2 visit  GOALS: Goals reviewed with patient? Yes  SHORT TERM GOALS: Target date: 07/04/2022  Patient will be independent in home exercise program to  improve strength/mobility for better functional independence with ADLs  Baseline: EVAL: Patient reports mostly just walking  Goal status: INITIAL  LONG TERM GOALS: Target date: 08/14/2021  Pt will decrease 5TSTS by at least 6 seconds in order to demonstrate clinically significant improvement in LE strength. Baseline: EVAL= 23.62 sec without UE  Goal status: INITIAL  2. Pt will improve FOTO to target score of 66 to display perceived improvements in ability to complete ADL's.  Baseline: Eval= 60 Goal status: INITIAL  3.  Pt will decrease TUG to below 14 seconds/decrease in order to demonstrate decreased fall risk. Baseline: Eval= 20.14 sec without AD Goal status: INITIAL  4.  Patient will increase six minute walk test distance to >1200 ft for improve gait ability and return to PLOF to complete her walks in her neighborhood.  Baseline: EVAL= 915 feet without an  AD Goal status: INITIAL 5. Pt will increase 10MWT by at least 0.13 m/s in order to demonstrate clinically significant improvement in community ambulation.    Baseline: Eval = 0.70 m/s  Goal status: INITIAL  ASSESSMENT:  CLINICAL IMPRESSION: Patient continue to present with improving balance- able to perform single leg stance better today and no significant LOB. She was fatigued with progressive LE strenghtening and required VC to perform safely and correctly. She performed well overall - VC to slow down for max strength benefit.  Pt will benefit from skilled PT services to improve her overall left LE muscle strength and improve her mobility to decrease her fall risk and improve her overall quality of life.   OBJECTIVE IMPAIRMENTS: Abnormal gait, decreased activity tolerance, decreased balance, decreased coordination, decreased endurance, decreased mobility, difficulty walking, and decreased strength.   ACTIVITY LIMITATIONS: carrying, lifting, bending, standing, squatting, stairs, and transfers  PARTICIPATION LIMITATIONS:  cleaning, laundry, driving, shopping, community activity, and yard work  PERSONAL FACTORS: 3+ comorbidities: OA, HTN, hx of previous CVA  are also affecting patient's functional outcome.   REHAB POTENTIAL: Good  CLINICAL DECISION MAKING: Evolving/moderate complexity  EVALUATION COMPLEXITY: Moderate  PLAN:  PT FREQUENCY: 1-2x/week  PT DURATION: 12 weeks  PLANNED INTERVENTIONS: Therapeutic exercises, Therapeutic activity, Neuromuscular re-education, Balance training, Gait training, Patient/Family education, Self Care, Joint mobilization, Joint manipulation, Stair training, Vestibular training, Canalith repositioning, DME instructions, Dry Needling, Electrical stimulation, Spinal manipulation, Spinal mobilization, Cryotherapy, Moist heat, Taping, and Manual therapy  PLAN FOR NEXT SESSION: Progressive balance training and LE strengthening and add to HEP as appropriate next session.   Lewis Moccasin PT' Physical Therapist- [facility]  06/25/2022, 9:50 PM

## 2022-06-26 NOTE — Therapy (Signed)
OUTPATIENT OCCUPATIONAL THERAPY NEURO TREATMENT NOTE           Patient Name: Teresa [name] MRN: [id number] DOB:Jun 06, 1940, 82 y.o., female Today's Date: 05/23/2022  PCP: Dr. [name] REFERRING PROVIDER: Dr. [name]   OT End of Session - 06/26/22 0817     Visit Number 108    Number of Visits 117    Date for OT Re-Evaluation 07/18/22    Authorization Time Period Reporting period beginning 05/21/22    Progress Note Due on Visit 10    OT Start Time 1100    OT Stop Time 1145    OT Time Calculation (min) 45 min    Equipment Utilized During Treatment cane    Activity Tolerance Patient tolerated treatment well    Behavior During Therapy WFL for tasks assessed/performed             Past Medical History:  Diagnosis Date   Cancer    skin   Hypothyroidism    Raynaud's disease    Stroke    Past Surgical History:  Procedure Laterality Date   ABDOMINAL HYSTERECTOMY  1991   APPENDECTOMY  1991   BACK SURGERY  2010   Cervical fusion C4-5-6-7   CATARACT EXTRACTION, BILATERAL Bilateral 10/2016   CHOLECYSTECTOMY  1995   COLONOSCOPY WITH PROPOFOL N/A 12/08/2018   Procedure: COLONOSCOPY WITH PROPOFOL;  Surgeon: Toledo, Benay Pike, MD;  Location: [facility] ENDOSCOPY;  Service: Gastroenterology;  Laterality: N/A;   EYE SURGERY     Patient Active Problem List   Diagnosis Date Noted   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  Following inpt rehab, pt went home and  had home health.   PRECAUTIONS: fall  SUBJECTIVE: Pt reported increased soreness in the L shoulder following OT session last visit and with increased fatigue, having taken a 3 hour nap following her last OT/PT session last week.  PAIN:  Are you having pain? Mild shoulder soreness L  OBJECTIVE:  L grip 6, R grip 41 L lateral pinch 5, R 13 L 3 point pinch 4, R 14  L 9 hole 1 min 43 sec. L shoulder active flexion 0-105, passive 0-125 02/05/22: L grip 13#  L lateral pinch 8#  3 point pinch 5#  L shoulder flex 0-108 03/12/22:  L grip: 12# L lateral pinch: 8# 3 point pinch: 6# L 9 hole: 1 min 3 sec L shoulder flexion 0-110 04/16/22: L grip: 3# L lateral pinch: 4# 3 point pinch: difficulty maintaining 3 point pinch  L 9 hole: 5 pegs in 2 min 36 sec  L shoulder flexion 0-107 BP L 132/79 HR 80 02 sats 96% 04/25/22: L grip: 14#; R grip: 50# L lateral 2#, R lateral 17# 3 point pinch: L 4#, R 14# (Saehan pinch gauge) 9 hole peg test: 1 min, 48 sec L shoulder flex 125 (R shoulder 132 active) 05/21/22: L grip: 15#; R grip: 50# L lateral 8#, R lateral 20# 3 point pinch: L 4#, R  12# (Saehan pinch gauge) 9 hole peg test: 1 min, 23 sec  TODAY'S TREATMENT: Therapeutic Exercise: Facilitated L grip strengthening with hand gripper set at 20 # of resistance to complete 3 sets 10 reps.  Facilitated LUE strengthening with use of yellow theraband to complete elbow extension, elbow flexion, L wrist flex, ext, and RD/UD for 2 sets 10 reps each.  Provided manual resistance to perform forearm pron/sup x2 sets 10 reps each.  OT provided stability at L forearm and elbow to maintain form during forearm and wrist strengthening.  Avoided L shoulder strengthening this date d/t reported soreness from last session.  Neuro re-ed: Facilitated L hand FMC/dexterity skills working to pick up small pegs from wash cloth (non-skid surface) and place into pegboard.  Pt completed pegboard border, with cues to place  peg into every other hole as pt lacked coordination to place pegs directly beside each other.  Provided cues for resting forearm on table top to isolate hand and wrist from upper arm.  PATIENT EDUCATION: Education details: FMC/dexterity skills, LUE resistive exercises Person educated: pt Education method: Merchandiser, retail cues, demo Education comprehension: verbalized understanding, returned demo with tactile and vc   HOME EXERCISE PROGRAM Continue to engage LUE into ADLs; continue use of putty for gripping and pinching exercises for LUE, and L shoulder AROM/AAROM, crocheting, typing; increase participation in IADL tasks for greater use of L hand.    OT Long Term Goals - 06/06/22 (6 weeks)      OT LONG TERM GOAL #1   Title Pt will be independent with home exercise program.    Baseline Eval: no current program, 10th visit:  continue to add new exercises as pt progresses, 20th:  continue to update HEP; 08/17/21: continue to progress HEP when indicated.  Visit 40: adding new exercises as pt progresses; 12/25/21: ongoing with progressions; 03/12/22: inconsistent use of putty but regular use of stress ball (every other day).  Encouraged pt return to daily putty use (2x daily when able) to target grip and pinch strengthening; 04/16/22: not reviewed today d/t OT sent pt to ED; 04/25/22: added pulleys and pt has re-started putty; 05/21/22: stopped pulleys and added stronger theraputty and encouraged pt crochet often    Time 12    Period Weeks    Status On-going    Target Date 06/06/22     OT LONG TERM GOAL #2   Title Pt will complete UB and LB dressing with modified independence including buttons, snaps and zippers.    Baseline requires min assist at eval, 10th visit: occasional assist with buttons, 20th:  able to perform one handed, but difficulty with bilateral UE; 08/17/21: pt reports inconsistent with 1 hand, reviewed techniques this visit, Visit 40:  Pt requires assist with bra   Time 12     Period Weeks    Status MET   Target Date 11/08/21     OT LONG TERM GOAL #3   Title Pt will perform shower transfer with modified independence.    Baseline Pt requires supervision to min assist for shower transfer at home. 10th visit: supervision; 08/17/21: supv .  Visit 40:  Pt had met goal but had recent fall and now requiring min guard to supv   Time 6    Period Weeks    Status  Met    Target Date 11/08/21      OT LONG TERM GOAL #4   Title Pt will improve L hand grip to 20 or more #s to  enable pt to open a new jar.   Baseline no grip in left hand at eval, 10th visit:  improved flexion but still working towards composite fisting and grip. 20th:  continues to demo decreased grip; 08/17/21: active digit flexion improving, but not yet able to register grip on dynamometer; 09/04/21: L grip 1# 7/17:  5#; 12/25/21: L grip 6#; 02/05/22: 13#; 03/12/22: L grip 12#; 1/23//24: L grip 3# (decreased); 04/25/22: L grip 14#; spouse assists to open new jars; 05/21/22: L grip 15#   Time 12    Period Weeks    Status Revised   Target Date 07/18/22     OT LONG TERM GOAL #5   Title Pt will improve left shoulder flexion to 100 degrees or better to improve reaching to obtain self care items from shelf/shoulder height.    Baseline difficulty with reach, shoulder flexion to 47 degrees; 08/17/21: L shoulder flexion 85, but not yet able to consistently hold ADL supplies in L hand when reaching; 09/04/21: flexion 85 7/17:  shoulder flexion to 90; 12/25/21: 105 (P 125); 03/12/22: active L shoulder flexion 110 (pt uses R arm to reach for items at shoulder height or above); 04/16/22: L shoulder flexion 107; 04/25/22: L shoulder flexion 125*, R 132*    Time 12    Period Weeks    Status achieved   Target Date 04/25/22     OT LONG TERM GOAL #6   Title Pt will improve FOTO score to 47 or above to demonstrate a clinically relevant change in function to impact ADL tasks.    Baseline score of 30 at eval; 08/17/21: FOTO: 42; 09/04/21: FOTO :  54 , FOTO 7/7:  60; 12/25/21: 59; 03/12/22: 66; 04/16/22: 67; 04/25/22; 51; 05/21/22: 74   Time 12    Period Weeks    Status  MET/ongoing    Target Date 04/25/22     OT LONG TERM GOAL #7   Title Pt will demonstrate ability to pick up small objects and complete 9 hole peg test in less than 1 min 30 sec (revised from under 2 min)   Baseline unable to perform at eval, 10th visit: still unable to pick up small pegs, 20th: unable to pick up pegs but improving; 08/17/21: not attempted d/t time constraints, will assess next visit; 09/04/21: 9 hole peg test in 5 min 53 sec;  7/17:  Pt unable to complete this date but was able to place 6 of 9 pegs in 4 mins 20 secs; 12/25/21: L 1 min 43 sec; 03/12/22: L 1 min 2 sec (requires non-skid surface to pick up small items from table top); 04/16/22: 5 pegs in 2 min 36 secs; 04/25/22: 1 min 48 sec; 05/21/22: L 1 min 23 sec   Time 12    Period Weeks    Status Revised/On-going    Target Date 07/18/22    OT LONG TERM GOAL #8   Title Pt will increase LUE strength by 1 MM grade in order to hold blow dryer in LUE to dry hair without dropping dryer.   Baseline 04/25/22: Unable to sustain grip and maintain lift of LUE with hand near head without dropping dryer.  Dryer has caused bruising above L eye with previous attempts.  L shoulder flex/abd 3/5, elbow flex/ext 3+/5    Time 12    Period Weeks    Status New    Target Date 07/18/22        OT LONG TERM GOAL #9   Title Pt  will increase L hand dexterity to enable pt to independently manage clothing fasteners.   Baseline 04/25/22: pt ties shoe laces loosely and requires assist from spouse occasionally.  When typing, pt engages the L hand for ~25% of the task.  Pt avoids wearing jeans d/t inability to manage button or zipper.     Time 12    Period Weeks    Status New    Target Date 07/18/22        OT LONG TERM GOAL #10   Title Pt will be able to independently carry a light plate of food or drink in L hand without spilling/dropping  to increase efficiency with item transport in the kitchen.   Baseline 04/25/22: Pt requires constant cues to keep cup or plate level while multitasking in order to prevent spilling.   Time 12    Period Weeks    Status New    Target Date 07/18/22     Plan -     Clinical Impression Statement Pt tolerated all therapeutic exercises well this date.   Avoided resistive exercises to L shoulder this date d/t reported residual soreness from last session.   Pt continues to require OT to stabilize L elbow to isolate for forearm pron/sup, and assist to stabilize the forearm during wrist strengthening exercises to reduce use of accessory muscles.  Pt is progressing well with L hand FMC/dexterity skills.  Pt completed small pegboard border, with cues to place peg into every other hole as pt lacked coordination to place pegs directly beside each other.  Provided cues for resting forearm on table top to isolate hand and wrist from upper arm.  Pt will continue to work towards goals in plan of care to improve left UE functional use in ADL and IADL tasks at home and in the community.    OT Occupational Profile and History Detailed Assessment- Review of Records and additional review of physical, cognitive, psychosocial history related to current functional performance    Occupational performance deficits (Please refer to evaluation for details): ADL's;IADL's;Leisure;Rest and Sleep    Body Structure / Function / Physical Skills ADL;Coordination;Endurance;GMC;UE functional use;Balance;IADL;Pain;Dexterity;FMC;Strength;Edema;Mobility;ROM    Psychosocial Skills Environmental  Adaptations;Habits;Routines and Behaviors    Rehab Potential Good    Clinical Decision Making Several treatment options, min-mod task modification necessary   Comorbidities Affecting Occupational Performance: May have comorbidities impacting occupational performance    Modification or Assistance to Complete Evaluation  Min-Moderate modification of tasks  or assist with assess necessary to complete eval    OT Frequency 2x / week    OT Duration 12 weeks    OT Treatment/Interventions Self-care/ADL training;Cryotherapy;Paraffin;Therapeutic exercise;DME and/or AE instruction;Functional Mobility Training;Balance training;Electrical Stimulation;Ultrasound;Neuromuscular education;Manual Therapy;Splinting;Moist Heat;Contrast Bath;Passive range of motion;Therapeutic activities;Patient/family education;Coping strategies training    Plan OT recert    Consulted and Agree with Plan of Care Patient           Leta Speller, MS, OTR/L  Darleene Cleaver, OT 06/26/2022, 8:18 AM

## 2022-06-27 ENCOUNTER — Ambulatory Visit: Payer: Medicare PPO

## 2022-06-28 ENCOUNTER — Ambulatory Visit: Payer: Medicare PPO

## 2022-07-01 ENCOUNTER — Ambulatory Visit (INDEPENDENT_AMBULATORY_CARE_PROVIDER_SITE_OTHER): Payer: Medicare PPO

## 2022-07-01 DIAGNOSIS — I639 Cerebral infarction, unspecified: Secondary | ICD-10-CM | POA: Diagnosis not present

## 2022-07-01 LAB — CUP PACEART REMOTE DEVICE CHECK
Date Time Interrogation Session: 20240405230628
Implantable Pulse Generator Implant Date: 19440924

## 2022-07-02 ENCOUNTER — Ambulatory Visit: Payer: Medicare PPO

## 2022-07-02 DIAGNOSIS — M6281 Muscle weakness (generalized): Secondary | ICD-10-CM

## 2022-07-02 DIAGNOSIS — G8929 Other chronic pain: Secondary | ICD-10-CM

## 2022-07-02 DIAGNOSIS — R278 Other lack of coordination: Secondary | ICD-10-CM

## 2022-07-02 DIAGNOSIS — R2681 Unsteadiness on feet: Secondary | ICD-10-CM

## 2022-07-02 DIAGNOSIS — I63511 Cerebral infarction due to unspecified occlusion or stenosis of right middle cerebral artery: Secondary | ICD-10-CM

## 2022-07-02 DIAGNOSIS — R262 Difficulty in walking, not elsewhere classified: Secondary | ICD-10-CM

## 2022-07-02 NOTE — Therapy (Signed)
OUTPATIENT PHYSICAL THERAPY NEURO TREATMENT   Patient Name: Teresa [name] MRN: [id number] DOB:09/27/1940, 82 y.o., female Today's Date: 07/02/2022   PCP: Dr. Aram Beecham REFERRING PROVIDER: Dr. Aram Beecham  END OF SESSION:  PT End of Session - 07/02/22 1148     Visit Number 8    Number of Visits 24    Date for PT Re-Evaluation 08/15/22    Authorization Type [employer] PPO-    Authorization Time Period 05/23/2022- 08/15/2022    Progress Note Due on Visit 10    PT Start Time 1145    PT Stop Time 1225    PT Time Calculation (min) 40 min    Equipment Utilized During Treatment Gait belt    Activity Tolerance Patient tolerated treatment well    Behavior During Therapy WFL for tasks assessed/performed                Past Medical History:  Diagnosis Date   Cancer    skin   Hypothyroidism    Raynaud's disease    Stroke    Past Surgical History:  Procedure Laterality Date   ABDOMINAL HYSTERECTOMY  1991   APPENDECTOMY  1991   BACK SURGERY  2010   Cervical fusion C4-5-6-7   CATARACT EXTRACTION, BILATERAL Bilateral 10/2016   CHOLECYSTECTOMY  1995   COLONOSCOPY WITH PROPOFOL N/A 12/08/2018   Procedure: COLONOSCOPY WITH PROPOFOL;  Surgeon: Toledo, Boykin Nearing, MD;  Location: [facility] ENDOSCOPY;  Service: Gastroenterology;  Laterality: N/A;   EYE SURGERY     Patient Active Problem List   Diagnosis Date Noted   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   Stroke 02/27/2021   History of nonmelanoma skin cancer 05/23/2014    ONSET DATE: 04/16/2022  REFERRING DIAG: 69.354 (ICD-10-CM) - Hemiplegia and hemiparesis following cerebral infarction affecting left non-dominant side   THERAPY DIAG:  Muscle weakness (generalized)  Right middle cerebral artery stroke  Other lack of coordination  Unsteadiness on feet  Difficulty in walking, not elsewhere classified  Chronic left shoulder pain  Rationale for Evaluation and Treatment:  Rehabilitation  SUBJECTIVE:                                                                                                                                                                                             SUBJECTIVE STATEMENT: Shoulder feeling better. Pt has trouble seeing the eclipse due to the sustained cervical extension demands.    Pt accompanied by: significant other  PERTINENT HISTORY: Pt is a 82 y.o. female with referral for hemiplegia with left sided weakness -original CVA on 02/26/21 and new onset of left sided  weakness on 04/16/2022.  R MCA CVA on 02/26/21. Pt completed outpatient PT on 01/17/2022 with majority of goals met. PMH includes: skin cancer, hypothyroidism, and Raynaud's disease, history of cervical fusion in 2010 C4-C7.   PAIN:  Are you having pain? No  PRECAUTIONS: Fall  WEIGHT BEARING RESTRICTIONS: No  FALLS: Has patient fallen in last 6 months? No   PATIENT GOALS: I want to walk without this cane and no falls and enjoy going out to grandkids sporting event  OBJECTIVE:     TODAY'S TREATMENT:                                                                                                                              DATE: 07/02/22  -STS from chair 2x10  -overground AMB c SPC RUE, 2.5lb AW bilat; 45750ft on lower level   Sprint intervals: 17400ft, 5lb AW bilat, max speed, 1 red mad, 1 SPC step over, 1 blue mat -31sec -30sec -29sec -27sec -25sec -25sec   *30sec recvoery seated between intervals   PATIENT EDUCATION: Education details: PT plan of care Person educated: Patient Education method: Explanation, Demonstration, Tactile cues, Verbal cues, and Handouts Education comprehension: verbalized understanding, returned demonstration, verbal cues required, tactile cues required, and needs further education  HOME EXERCISE PROGRAM: To be updated next 1-2 visit  GOALS: Goals reviewed with patient? Yes  SHORT TERM GOALS: Target date:  07/04/2022  Patient will be independent in home exercise program to improve strength/mobility for better functional independence with ADLs  Baseline: EVAL: Patient reports mostly just walking  Goal status: INITIAL  LONG TERM GOALS: Target date: 08/14/2021  Pt will decrease 5TSTS by at least 6 seconds in order to demonstrate clinically significant improvement in LE strength. Baseline: EVAL= 23.62 sec without UE  Goal status: INITIAL  2. Pt will improve FOTO to target score of 66 to display perceived improvements in ability to complete ADL's.  Baseline: Eval= 60 Goal status: INITIAL  3.  Pt will decrease TUG to below 14 seconds/decrease in order to demonstrate decreased fall risk. Baseline: Eval= 20.14 sec without AD Goal status: INITIAL  4.  Patient will increase six minute walk test distance to >1200 ft for improve gait ability and return to PLOF to complete her walks in her neighborhood.  Baseline: EVAL= 915 feet without an AD Goal status: INITIAL 5. Pt will increase 10MWT by at least 0.13 m/s in order to demonstrate clinically significant improvement in community ambulation.    Baseline: Eval = 0.70 m/s  Goal status: INITIAL  ASSESSMENT:  CLINICAL IMPRESSION: Continued to work on many of patients walking-related goals of treatment. Due to recent advances in function, pt able to tolerate increase in intervention intensity today. Will continue to benefit from additional services to maximize rehabilitative potential.   OBJECTIVE IMPAIRMENTS: Abnormal gait, decreased activity tolerance, decreased balance, decreased coordination, decreased endurance, decreased mobility, difficulty walking, and decreased strength.   ACTIVITY LIMITATIONS: carrying, lifting, bending, standing, squatting,  stairs, and transfers  PARTICIPATION LIMITATIONS: cleaning, laundry, driving, shopping, community activity, and yard work  PERSONAL FACTORS: 3+ comorbidities: OA, HTN, hx of previous CVA  are also  affecting patient's functional outcome.   REHAB POTENTIAL: Good  CLINICAL DECISION MAKING: Evolving/moderate complexity  EVALUATION COMPLEXITY: Moderate  PLAN:  PT FREQUENCY: 1-2x/week  PT DURATION: 12 weeks  PLANNED INTERVENTIONS: Therapeutic exercises, Therapeutic activity, Neuromuscular re-education, Balance training, Gait training, Patient/Family education, Self Care, Joint mobilization, Joint manipulation, Stair training, Vestibular training, Canalith repositioning, DME instructions, Dry Needling, Electrical stimulation, Spinal manipulation, Spinal mobilization, Cryotherapy, Moist heat, Taping, and Manual therapy  PLAN FOR NEXT SESSION: Progressive balance training and LE strengthening and add to HEP as appropriate next session.   Rosamaria Lints PT Physical Therapist- [facility]  07/02/2022, 11:55 AM   12:28 PM, 07/02/22 Rosamaria Lints, PT, DPT Physical Therapist - [facility]  Outpatient Physical Therapy- Main [facility] [phone]

## 2022-07-02 NOTE — Therapy (Signed)
OUTPATIENT OCCUPATIONAL THERAPY NEURO TREATMENT NOTE           Patient Name: Teresa [name] MRN: [id number] DOB:08-Jun-1940, 82 y.o., female Today's Date: 05/23/2022  PCP: Dr. Aram Beecham REFERRING PROVIDER: Dr. Aram Beecham   OT End of Session - 07/02/22 1627     Visit Number 109    Number of Visits 117    Date for OT Re-Evaluation 07/18/22    Authorization Time Period Reporting period beginning 05/21/22    Progress Note Due on Visit 10    OT Start Time 1108    OT Stop Time 1146    OT Time Calculation (min) 38 min    Equipment Utilized During Treatment cane    Activity Tolerance Patient tolerated treatment well    Behavior During Therapy WFL for tasks assessed/performed             Past Medical History:  Diagnosis Date   Cancer    skin   Hypothyroidism    Raynaud's disease    Stroke    Past Surgical History:  Procedure Laterality Date   ABDOMINAL HYSTERECTOMY  1991   APPENDECTOMY  1991   BACK SURGERY  2010   Cervical fusion C4-5-6-7   CATARACT EXTRACTION, BILATERAL Bilateral 10/2016   CHOLECYSTECTOMY  1995   COLONOSCOPY WITH PROPOFOL N/A 12/08/2018   Procedure: COLONOSCOPY WITH PROPOFOL;  Surgeon: Toledo, Boykin Nearing, MD;  Location: [facility] ENDOSCOPY;  Service: Gastroenterology;  Laterality: N/A;   EYE SURGERY     Patient Active Problem List   Diagnosis Date Noted   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  Following inpt rehab, pt went home and  had home health.   PRECAUTIONS: fall  SUBJECTIVE: Pt reports feeling more tired today.    PAIN:  Are you having pain? none  OBJECTIVE:  L grip 6, R grip 41 L lateral pinch 5, R 13 L 3 point pinch 4, R 14  L 9 hole 1 min 43 sec. L shoulder active flexion 0-105, passive 0-125 02/05/22: L grip 13#  L lateral pinch 8#  3 point pinch 5#  L shoulder flex 0-108 03/12/22:  L grip: 12# L lateral pinch: 8# 3 point pinch: 6# L 9 hole: 1 min 3 sec L shoulder flexion 0-110 04/16/22: L grip: 3# L lateral pinch: 4# 3 point pinch: difficulty maintaining 3 point pinch  L 9 hole: 5 pegs in 2 min 36 sec  L shoulder flexion 0-107 BP L 132/79 HR 80 02 sats 96% 04/25/22: L grip: 14#; R grip: 50# L lateral 2#, R lateral 17# 3 point pinch: L 4#, R 14# (Saehan pinch gauge) 9 hole peg test: 1 min, 48 sec L shoulder flex 125 (R shoulder 132 active) 05/21/22: L grip: 15#; R grip: 50# L lateral 8#, R lateral 20# 3 point pinch: L 4#, R 12# (Saehan pinch gauge) 9 hole peg test: 1 min, 23 sec  TODAY'S TREATMENT: Therapeutic Exercise: Facilitated L grip strengthening with hand gripper set  at 20 # of resistance to complete 3 sets 10 reps.  Facilitated pinch strengthening with use of therapy resistant clothespins to target lateral with the L hand.  Pt was able to manage all colors today, including black most resistive pins, though pt required repeated trials, rest break, and use of hand warmer prior to successful clipping of the black pins.  Pt clipped pins onto dowel which was placed on table top in front pt far enough to promote L elbow extension and slight humeral flexion, using backrest of chair as a tactile cue to prevent forward leaning while reaching.  Neuro re-ed: Facilitated L hand FMC/dexterity skills working to pick up 1/2"-1" washers from magnetic dish and placed over a diagonal dowel which pt held in her R hand.  Pt practiced moving 1 and 2 washers off dowel at a time, storing in hand, and  moving washers from palm to fingertips to discard washers back into dish.  PATIENT EDUCATION: Education details: FMC/dexterity skills, LUE reaching Person educated: pt Education method: Leisure centre managerxplanation and Verbal cues, demo Education comprehension: verbalized understanding, returned demo with tactile and vc   HOME EXERCISE PROGRAM Continue to engage LUE into ADLs; continue use of putty for gripping and pinching exercises for LUE, and L shoulder AROM/AAROM, crocheting, typing; increase participation in IADL tasks for greater use of L hand.    OT Long Term Goals - 06/06/22 (6 weeks)      OT LONG TERM GOAL #1   Title Pt will be independent with home exercise program.    Baseline Eval: no current program, 10th visit:  continue to add new exercises as pt progresses, 20th:  continue to update HEP; 08/17/21: continue to progress HEP when indicated.  Visit 40: adding new exercises as pt progresses; 12/25/21: ongoing with progressions; 03/12/22: inconsistent use of putty but regular use of stress ball (every other day).  Encouraged pt return to daily putty use (2x daily when able) to target grip and pinch strengthening; 04/16/22: not reviewed today d/t OT sent pt to ED; 04/25/22: added pulleys and pt has re-started putty; 05/21/22: stopped pulleys and added stronger theraputty and encouraged pt crochet often    Time 12    Period Weeks    Status On-going    Target Date 06/06/22     OT LONG TERM GOAL #2   Title Pt will complete UB and LB dressing with modified independence including buttons, snaps and zippers.    Baseline requires min assist at eval, 10th visit: occasional assist with buttons, 20th:  able to perform one handed, but difficulty with bilateral UE; 08/17/21: pt reports inconsistent with 1 hand, reviewed techniques this visit, Visit 40:  Pt requires assist with bra   Time 12    Period Weeks    Status MET   Target Date 11/08/21     OT LONG TERM GOAL #3   Title Pt will perform shower transfer with  modified independence.    Baseline Pt requires supervision to min assist for shower transfer at home. 10th visit: supervision; 08/17/21: supv .  Visit 40:  Pt had met goal but had recent fall and now requiring min guard to supv   Time 6    Period Weeks    Status  Met    Target Date 11/08/21      OT LONG TERM GOAL #4   Title Pt will improve L hand grip to 20 or more #s to enable pt to open a new jar.   Baseline no  grip in left hand at eval, 10th visit:  improved flexion but still working towards composite fisting and grip. 20th:  continues to demo decreased grip; 08/17/21: active digit flexion improving, but not yet able to register grip on dynamometer; 09/04/21: L grip 1# 7/17:  5#; 12/25/21: L grip 6#; 02/05/22: 13#; 03/12/22: L grip 12#; 1/23//24: L grip 3# (decreased); 04/25/22: L grip 14#; spouse assists to open new jars; 05/21/22: L grip 15#   Time 12    Period Weeks    Status Revised   Target Date 07/18/22     OT LONG TERM GOAL #5   Title Pt will improve left shoulder flexion to 100 degrees or better to improve reaching to obtain self care items from shelf/shoulder height.    Baseline difficulty with reach, shoulder flexion to 47 degrees; 08/17/21: L shoulder flexion 85, but not yet able to consistently hold ADL supplies in L hand when reaching; 09/04/21: flexion 85 7/17:  shoulder flexion to 90; 12/25/21: 105 (P 125); 03/12/22: active L shoulder flexion 110 (pt uses R arm to reach for items at shoulder height or above); 04/16/22: L shoulder flexion 107; 04/25/22: L shoulder flexion 125*, R 132*    Time 12    Period Weeks    Status achieved   Target Date 04/25/22     OT LONG TERM GOAL #6   Title Pt will improve FOTO score to 47 or above to demonstrate a clinically relevant change in function to impact ADL tasks.    Baseline score of 30 at eval; 08/17/21: FOTO: 42; 09/04/21: FOTO : 54 , FOTO 7/7:  60; 12/25/21: 59; 03/12/22: 66; 04/16/22: 67; 04/25/22; 51; 05/21/22: 74   Time 12    Period Weeks    Status   MET/ongoing    Target Date 04/25/22     OT LONG TERM GOAL #7   Title Pt will demonstrate ability to pick up small objects and complete 9 hole peg test in less than 1 min 30 sec (revised from under 2 min)   Baseline unable to perform at eval, 10th visit: still unable to pick up small pegs, 20th: unable to pick up pegs but improving; 08/17/21: not attempted d/t time constraints, will assess next visit; 09/04/21: 9 hole peg test in 5 min 53 sec;  7/17:  Pt unable to complete this date but was able to place 6 of 9 pegs in 4 mins 20 secs; 12/25/21: L 1 min 43 sec; 03/12/22: L 1 min 2 sec (requires non-skid surface to pick up small items from table top); 04/16/22: 5 pegs in 2 min 36 secs; 04/25/22: 1 min 48 sec; 05/21/22: L 1 min 23 sec   Time 12    Period Weeks    Status Revised/On-going    Target Date 07/18/22    OT LONG TERM GOAL #8   Title Pt will increase LUE strength by 1 MM grade in order to hold blow dryer in LUE to dry hair without dropping dryer.   Baseline 04/25/22: Unable to sustain grip and maintain lift of LUE with hand near head without dropping dryer.  Dryer has caused bruising above L eye with previous attempts.  L shoulder flex/abd 3/5, elbow flex/ext 3+/5    Time 12    Period Weeks    Status New    Target Date 07/18/22        OT LONG TERM GOAL #9   Title Pt will increase L hand dexterity to enable pt to independently manage  clothing fasteners.   Baseline 04/25/22: pt ties shoe laces loosely and requires assist from spouse occasionally.  When typing, pt engages the L hand for ~25% of the task.  Pt avoids wearing jeans d/t inability to manage button or zipper.     Time 12    Period Weeks    Status New    Target Date 07/18/22        OT LONG TERM GOAL #10   Title Pt will be able to independently carry a light plate of food or drink in L hand without spilling/dropping to increase efficiency with item transport in the kitchen.   Baseline 04/25/22: Pt requires constant cues to keep cup or plate  level while multitasking in order to prevent spilling.   Time 12    Period Weeks    Status New    Target Date 07/18/22     Plan -     Clinical Impression Statement Pt reported increased fatigue today, but still tolerated all therapeutic exercises well this date.  Pt continues to progress well with L hand FMC/dexterity skills with improved ability to pick up washers from magnetic dish and move them through her palm to fingertips.  Pt required only initial cues today to consistently demo reaching with reduced compensatory forward leaning, noting good ability to keep back up against backrest on chair when reaching to place clips on dowel in front of her.   Pt was able to manage black most resistive clips today with repeated trials, rest break, and use of hand warmer.  Pt will continue to work towards goals in plan of care to improve left UE functional use in ADL and IADL tasks at home and in the community.    OT Occupational Profile and History Detailed Assessment- Review of Records and additional review of physical, cognitive, psychosocial history related to current functional performance    Occupational performance deficits (Please refer to evaluation for details): ADL's;IADL's;Leisure;Rest and Sleep    Body Structure / Function / Physical Skills ADL;Coordination;Endurance;GMC;UE functional use;Balance;IADL;Pain;Dexterity;FMC;Strength;Edema;Mobility;ROM    Psychosocial Skills Environmental  Adaptations;Habits;Routines and Behaviors    Rehab Potential Good    Clinical Decision Making Several treatment options, min-mod task modification necessary   Comorbidities Affecting Occupational Performance: May have comorbidities impacting occupational performance    Modification or Assistance to Complete Evaluation  Min-Moderate modification of tasks or assist with assess necessary to complete eval    OT Frequency 2x / week    OT Duration 12 weeks    OT Treatment/Interventions Self-care/ADL  training;Cryotherapy;Paraffin;Therapeutic exercise;DME and/or AE instruction;Functional Mobility Training;Balance training;Electrical Stimulation;Ultrasound;Neuromuscular education;Manual Therapy;Splinting;Moist Heat;Contrast Bath;Passive range of motion;Therapeutic activities;Patient/family education;Coping strategies training    Plan OT recert    Consulted and Agree with Plan of Care Patient           Danelle Earthly, MS, OTR/L  Otis Dials, OT 07/02/2022, 4:29 PM

## 2022-07-04 ENCOUNTER — Ambulatory Visit: Payer: Medicare PPO

## 2022-07-04 DIAGNOSIS — M6281 Muscle weakness (generalized): Secondary | ICD-10-CM

## 2022-07-04 DIAGNOSIS — R2681 Unsteadiness on feet: Secondary | ICD-10-CM

## 2022-07-04 DIAGNOSIS — I63511 Cerebral infarction due to unspecified occlusion or stenosis of right middle cerebral artery: Secondary | ICD-10-CM

## 2022-07-04 DIAGNOSIS — R278 Other lack of coordination: Secondary | ICD-10-CM

## 2022-07-04 DIAGNOSIS — R262 Difficulty in walking, not elsewhere classified: Secondary | ICD-10-CM

## 2022-07-04 NOTE — Therapy (Signed)
OUTPATIENT PHYSICAL THERAPY NEURO TREATMENT   Patient Name: Teresa [name] MRN: [id number] DOB:1940/07/25, 82 y.o., female Today's Date: 07/04/2022   PCP: Dr. Aram Beecham REFERRING PROVIDER: Dr. Aram Beecham  END OF SESSION:  PT End of Session - 07/04/22 1538     Visit Number 9    Number of Visits 24    Date for PT Re-Evaluation 08/15/22    Authorization Type [employer] PPO-    Authorization Time Period 05/23/2022- 08/15/2022    Progress Note Due on Visit 10    PT Start Time 1102    PT Stop Time 1142    PT Time Calculation (min) 40 min    Equipment Utilized During Treatment Gait belt    Activity Tolerance Patient tolerated treatment well    Behavior During Therapy WFL for tasks assessed/performed                Past Medical History:  Diagnosis Date   Cancer    skin   Hypothyroidism    Raynaud's disease    Stroke    Past Surgical History:  Procedure Laterality Date   ABDOMINAL HYSTERECTOMY  1991   APPENDECTOMY  1991   BACK SURGERY  2010   Cervical fusion C4-5-6-7   CATARACT EXTRACTION, BILATERAL Bilateral 10/2016   CHOLECYSTECTOMY  1995   COLONOSCOPY WITH PROPOFOL N/A 12/08/2018   Procedure: COLONOSCOPY WITH PROPOFOL;  Surgeon: Toledo, Boykin Nearing, MD;  Location: [facility] ENDOSCOPY;  Service: Gastroenterology;  Laterality: N/A;   EYE SURGERY     Patient Active Problem List   Diagnosis Date Noted   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   Stroke 02/27/2021   History of nonmelanoma skin cancer 05/23/2014    ONSET DATE: 04/16/2022  REFERRING DIAG: 69.354 (ICD-10-CM) - Hemiplegia and hemiparesis following cerebral infarction affecting left non-dominant side   THERAPY DIAG:  Unsteadiness on feet  Muscle weakness (generalized)  Difficulty in walking, not elsewhere classified  Other lack of coordination  Rationale for Evaluation and Treatment: Rehabilitation  SUBJECTIVE:                                                                                                                                                                                              SUBJECTIVE STATEMENT: Pt reports feeling somewhat off balance and reports her BLEs feel "heavy."  Pt reports no falls. She reports she has allergies but no other symptoms. She reports no shoulder pain today   Pt accompanied by: significant other  PERTINENT HISTORY: Pt is a 82 y.o. female with referral for hemiplegia with left sided weakness -original CVA on 02/26/21 and new  onset of left sided weakness on 04/16/2022.  R MCA CVA on 02/26/21. Pt completed outpatient PT on 01/17/2022 with majority of goals met. PMH includes: skin cancer, hypothyroidism, and Raynaud's disease, history of cervical fusion in 2010 C4-C7.   PAIN:  Are you having pain? No  PRECAUTIONS: Fall  WEIGHT BEARING RESTRICTIONS: No  FALLS: Has patient fallen in last 6 months? No  LIVING ENVIRONMENT: Lives with: lives with their spouse Lives in: House/apartment Stairs: Yes: External: 1 steps; none Has following equipment at home: Single point cane, Walker - 2 wheeled, shower chair, and Grab bars  PLOF: Independent  PATIENT GOALS: I want to walk without this cane and no falls and enjoy going out to grandkids sporting event  OBJECTIVE:   DIAGNOSTIC FINDINGS: CLINICAL DATA:  Stroke suspected   EXAM: MRI HEAD WITHOUT CONTRAST   TECHNIQUE: Multiplanar, multiecho pulse sequences of the brain and surrounding structures were obtained without intravenous contrast.   COMPARISON:  MRI Head 02/26/21, CT Head 04/16/22   FINDINGS: Brain: Evolving right MCA territory infarct involving the right frontal operculum in the right insular cortex. No acute infarction, hemorrhage, hydrocephalus, extra-axial collection or mass lesion. Small focus of microhemorrhage in the left temporal lobe. There is petechial hemorrhage and/or hemosiderosis in the region of prior right MCA territory infarct. T2/stir hyperintense  signal in the right cerebral peduncle is favored to represent transneuronal degeneration.   Vascular: Major flow voids are preserved.   Skull and upper cervical spine: Normal marrow signal.   Sinuses/Orbits: Bilateral lens replacement. No mastoid or middle ear effusion. Paranasal sinuses are clear.   Other: None.   IMPRESSION: 1. No acute intracranial abnormality. 2. 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 3.     Electronically Signed   By: Lorenza CambridgeHemant  Desai M.D.   On: 04/16/2022 13:41  COGNITION: Overall cognitive status: Within functional limits for tasks assessed   SENSATION: WFL  COORDINATION: Intact with each LE  EDEMA:  None observed  MUSCLE TONE: LLE: Within functional limits  DTRs:  Patella 2+ = Normal and Achilles 2+ = Normal  POSTURE: rounded shoulders and forward head  LOWER EXTREMITY ROM:     Active  Right Eval Left Eval  Hip flexion WNL THROUGHOUT LE WNL THROUGHOUT LE  Hip extension    Hip abduction    Hip adduction    Hip internal rotation    Hip external rotation    Knee flexion    Knee extension    Ankle dorsiflexion    Ankle plantarflexion    Ankle inversion    Ankle eversion     (Blank rows = not tested)  LOWER EXTREMITY MMT:    MMT Right Eval Left Eval  Hip flexion 4+ 4  Hip extension 4+ 4  Hip abduction 4+ 4  Hip adduction 4+ 4  Hip internal rotation 4+ 4  Hip external rotation 4+ 4  Knee flexion 4+ 4  Knee extension 5 4  Ankle dorsiflexion 5 4  Ankle plantarflexion    Ankle inversion    Ankle eversion    (Blank rows = not tested)  BED MOBILITY:  Patient reports independent with all bed mobility and states has returned to master bed  TRANSFERS: Assistive device utilized: None  Sit to stand: Complete Independence Stand to sit: Complete Independence Chair to chair: Complete Independence Floor:  Not tested   GAIT: Gait pattern: step through pattern,  decreased arm swing- Right, decreased arm swing- Left, decreased  step length- Right, and decreased step length- Left Distance walked: 915 feet Assistive device utilized: None Level of assistance: SBA Comments: occasional scuff or decreased left foot clearance  FUNCTIONAL TESTS:  5 times sit to stand: 23.62 sec without UE support Timed up and go (TUG): 20.14 sec without an AD 10 meter walk test: 0.70 m/s  PATIENT SURVEYS:  FOTO 60/ goal of 66  TODAY'S TREATMENT:                                                                                                                              DATE: 07/04/22  Gait belt donned and CGA provided unless otherwise specified   Neuro Re-Ed: at support surface  Tandem stance 3x30-45 sec each LE  One foot on floor one on 6" step 2x30 sec each LE, pt then attempted with 4 trunk twists, maintained balance  SLB 2x30 sec each LE 2-finger support on bar required to maintain balance Alt LE foot tap onto 6" step x multiple reps each LE, intermittent UE support throughout  Gait with SPC 2x148 ft with emphasis on heel-toe sequencing  Obstacle course: navigating around multiple cones, narrow pathway, and heel tap onto cones, pt using SPC x several minutes. Challenging, pt knocks over cones.  Therex:   Seated DF 2x20 x1-2 sec hold/rep BLE   STS  without UE support x10, x12, x6   Toe walk - down and back by support surface x 10   Heel walk- down and back by support surface x 10   PATIENT EDUCATION: Education details: Pt educated throughout session about proper posture and technique with exercises. Improved exercise technique, movement at target joints, use of target muscles after min to mod verbal, visual, tactile cues.  Person educated: Patient Education method: Explanation, Demonstration, Tactile cues, and Verbal cues Education comprehension: verbalized understanding, returned demonstration, verbal cues required, tactile cues required, and needs  further education  HOME EXERCISE PROGRAM: To be updated next 1-2 visit  GOALS: Goals reviewed with patient? Yes  SHORT TERM GOALS: Target date: 07/04/2022  Patient will be independent in home exercise program to improve strength/mobility for better functional independence with ADLs  Baseline: EVAL: Patient reports mostly just walking  Goal status: INITIAL  LONG TERM GOALS: Target date: 08/14/2021  Pt will decrease 5TSTS by at least 6 seconds in order to demonstrate clinically significant improvement in LE strength. Baseline: EVAL= 23.62 sec without UE  Goal status: INITIAL  2. Pt will improve FOTO to target score of 66 to display perceived improvements in ability to complete ADL's.  Baseline: Eval= 60 Goal status: INITIAL  3.  Pt will decrease TUG to below 14 seconds/decrease in order to demonstrate decreased fall risk. Baseline: Eval= 20.14 sec without AD Goal status: INITIAL  4.  Patient will increase six minute walk test distance to >1200 ft for improve gait ability and return to PLOF to complete her walks in her neighborhood.  Baseline: EVAL= 915 feet  without an AD Goal status: INITIAL 5. Pt will increase by at least 0.13 m/s in order to demonstrate clinically significant improvement in community ambulation.    Baseline: Eval = 0.70 m/s  Goal status: INITIAL  ASSESSMENT:  CLINICAL IMPRESSION: Pt able to advance STS this session without difficulty. She presented to PT reporting difficulty with balance. Pt found SLB and obstacle course activities (navigating around cones) challenging today. The pt will benefit from skilled PT services to improve her overall left LE muscle strength and improve her mobility to decrease her fall risk and improve her overall quality of life.   OBJECTIVE IMPAIRMENTS: Abnormal gait, decreased activity tolerance, decreased balance, decreased coordination, decreased endurance, decreased mobility, difficulty walking, and decreased strength.    ACTIVITY LIMITATIONS: carrying, lifting, bending, standing, squatting, stairs, and transfers  PARTICIPATION LIMITATIONS: cleaning, laundry, driving, shopping, community activity, and yard work  PERSONAL FACTORS: 3+ comorbidities: OA, HTN, hx of previous CVA  are also affecting patient's functional outcome.   REHAB POTENTIAL: Good  CLINICAL DECISION MAKING: Evolving/moderate complexity  EVALUATION COMPLEXITY: Moderate  PLAN:  PT FREQUENCY: 1-2x/week  PT DURATION: 12 weeks  PLANNED INTERVENTIONS: Therapeutic exercises, Therapeutic activity, Neuromuscular re-education, Balance training, Gait training, Patient/Family education, Self Care, Joint mobilization, Joint manipulation, Stair training, Vestibular training, Canalith repositioning, DME instructions, Dry Needling, Electrical stimulation, Spinal manipulation, Spinal mobilization, Cryotherapy, Moist heat, Taping, and Manual therapy  PLAN FOR NEXT SESSION: Progressive balance training and LE strengthening and add to HEP as appropriate next session.   Temple Pacini PT, DPT  Physical Therapist- [facility]  07/04/2022, 4:31 PM

## 2022-07-05 NOTE — Progress Notes (Signed)
Carelink Summary Report / Loop Recorder 

## 2022-07-07 NOTE — Therapy (Addendum)
OUTPATIENT OCCUPATIONAL THERAPY NEURO PROGRESS AND TREATMENT NOTE Reporting period beginning 05/21/22-07/04/22          Patient Name: Teresa [name] MRN: [id number] DOB:01/08/1941, 82 y.o., female Today's Date: 05/23/2022  PCP: Dr. Aram Beecham REFERRING PROVIDER: Dr. Aram Beecham   OT End of Session - 07/07/22 1529     Visit Number 110    Number of Visits 117    Date for OT Re-Evaluation 07/18/22    Authorization Time Period Reporting period beginning 05/21/22-07/04/22    Progress Note Due on Visit 10    OT Start Time 1150    OT Stop Time 1230    OT Time Calculation (min) 40 min    Equipment Utilized During Treatment cane    Activity Tolerance Patient tolerated treatment well    Behavior During Therapy WFL for tasks assessed/performed             Past Medical History:  Diagnosis Date   Cancer    skin   Hypothyroidism    Raynaud's disease    Stroke    Past Surgical History:  Procedure Laterality Date   ABDOMINAL HYSTERECTOMY  1991   APPENDECTOMY  1991   BACK SURGERY  2010   Cervical fusion C4-5-6-7   CATARACT EXTRACTION, BILATERAL Bilateral 10/2016   CHOLECYSTECTOMY  1995   COLONOSCOPY WITH PROPOFOL N/A 12/08/2018   Procedure: COLONOSCOPY WITH PROPOFOL;  Surgeon: Toledo, Boykin Nearing, MD;  Location: [facility] ENDOSCOPY;  Service: Gastroenterology;  Laterality: N/A;   EYE SURGERY     Patient Active Problem List   Diagnosis Date Noted   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  Following inpt rehab, pt went home and had home health.   PRECAUTIONS: fall  SUBJECTIVE: Pt reports doing well today.    PAIN:  Are you having pain? none  OBJECTIVE:  L grip 6, R grip 41 L lateral pinch 5, R 13 L 3 point pinch 4, R 14  L 9 hole 1 min 43 sec. L shoulder active flexion 0-105, passive 0-125 02/05/22: L grip 13#  L lateral pinch 8#  3 point pinch 5#  L shoulder flex 0-108 03/12/22:  L grip: 12# L lateral pinch: 8# 3 point pinch: 6# L 9 hole: 1 min 3 sec L shoulder flexion 0-110 04/16/22: L grip: 3# L lateral pinch: 4# 3 point pinch: difficulty maintaining 3 point pinch  L 9 hole: 5 pegs in 2 min 36 sec  L shoulder flexion 0-107 BP L 132/79 HR 80 02 sats 96% 04/25/22: L grip: 14#; R grip: 50# L lateral 2#, R lateral 17# 3 point pinch: L 4#, R 14# (Saehan pinch gauge) 9 hole peg test: 1 min, 48 sec L shoulder flex 125 (R shoulder 132 active) 05/21/22: L grip: 15#; R grip: 50# L lateral 8#, R lateral 20# 3 point pinch: L 4#, R 12# (Saehan pinch gauge) 9 hole peg test: 1 min, 23 sec 07/04/22: L grip: 19# L lateral 10# 3 point  pinch: L 6# (standard pinch gauge-not Saehan) 9 hole peg test: 1 min, 14 sec  TODAY'S TREATMENT: Therapeutic Exercise: Objective measures taken and goals updated for progress note.  Facilitated L grip strengthening with hand gripper set at 20 # of resistance to complete 3 sets 10 reps.  Facilitated LUE strengthening with use of yellow theraband to complete elbow extension, elbow flexion, L wrist flex, ext, and RD/UD for 3 sets 10 reps each.  Provided manual resistance to perform forearm pron/sup x3 sets 10 reps each.  OT provided stability at L forearm and elbow to maintain form during forearm and wrist strengthening.    Neuro re-ed: Facilitated L hand FMC/dexterity skills working to pick up small pegs from table top without a non-skid surface and place into pegboard.  Pt places pegs spaced out d/t lacking  sufficient dexterity with placing pegs directly beside each other.    PATIENT EDUCATION: Education details: FMC/dexterity skills, LUE strengthening Person educated: pt Education method: Leisure centre manager cues, demo Education comprehension: verbalized understanding, returned demo with tactile and vc   HOME EXERCISE PROGRAM Continue to engage LUE into ADLs; continue use of putty for gripping and pinching exercises for LUE, and L shoulder AROM/AAROM, crocheting, typing; increase participation in IADL tasks for greater use of L hand.    OT Long Term Goals - 06/06/22 (6 weeks)      OT LONG TERM GOAL #1   Title Pt will be independent with home exercise program.    Baseline Eval: no current program, 10th visit:  continue to add new exercises as pt progresses, 20th:  continue to update HEP; 08/17/21: continue to progress HEP when indicated.  Visit 40: adding new exercises as pt progresses; 12/25/21: ongoing with progressions; 03/12/22: inconsistent use of putty but regular use of stress ball (every other day).  Encouraged pt return to daily putty use (2x daily when able) to target grip and pinch strengthening; 04/16/22: not reviewed today d/t OT sent pt to ED; 04/25/22: added pulleys and pt has re-started putty; 05/21/22: stopped pulleys and added stronger theraputty and encouraged pt crochet often; 07/04/22: pt working consistently with putty and grip strengthener at 20# at home   Time 12    Period Weeks    Status On-going    Target Date 06/06/22     OT LONG TERM GOAL #2   Title Pt will complete UB and LB dressing with modified independence including buttons, snaps and zippers.    Baseline requires min assist at eval, 10th visit: occasional assist with buttons, 20th:  able to perform one handed, but difficulty with bilateral UE; 08/17/21: pt reports inconsistent with 1 hand, reviewed techniques this visit, Visit 40:  Pt requires assist with bra   Time 12    Period Weeks    Status MET   Target Date  11/08/21     OT LONG TERM GOAL #3   Title Pt will perform shower transfer with modified independence.    Baseline Pt requires supervision to min assist for shower transfer at home. 10th visit: supervision; 08/17/21: supv .  Visit 40:  Pt had met goal but had recent fall and now requiring min guard to supv   Time 6    Period Weeks    Status  Met    Target Date 11/08/21      OT LONG TERM GOAL #4   Title Pt will improve L hand grip to 20 or more #s to enable pt to open a new jar.  Baseline no grip in left hand at eval, 10th visit:  improved flexion but still working towards composite fisting and grip. 20th:  continues to demo decreased grip; 08/17/21: active digit flexion improving, but not yet able to register grip on dynamometer; 09/04/21: L grip 1# 7/17:  5#; 12/25/21: L grip 6#; 02/05/22: 13#; 03/12/22: L grip 12#; 1/23//24: L grip 3# (decreased); 04/25/22: L grip 14#; spouse assists to open new jars; 05/21/22: L grip 15#; 07/04/22: L grip 19#   Time 12    Period Weeks    Status Revised/ongoing   Target Date 07/18/22     OT LONG TERM GOAL #5   Title Pt will improve left shoulder flexion to 100 degrees or better to improve reaching to obtain self care items from shelf/shoulder height.    Baseline difficulty with reach, shoulder flexion to 47 degrees; 08/17/21: L shoulder flexion 85, but not yet able to consistently hold ADL supplies in L hand when reaching; 09/04/21: flexion 85 7/17:  shoulder flexion to 90; 12/25/21: 105 (P 125); 03/12/22: active L shoulder flexion 110 (pt uses R arm to reach for items at shoulder height or above); 04/16/22: L shoulder flexion 107; 04/25/22: L shoulder flexion 125*, R 132*    Time 12    Period Weeks    Status achieved   Target Date 04/25/22     OT LONG TERM GOAL #6   Title Pt will improve FOTO score to 47 or above to demonstrate a clinically relevant change in function to impact ADL tasks.    Baseline score of 30 at eval; 08/17/21: FOTO: 42; 09/04/21: FOTO : 54 , FOTO  7/7:  60; 12/25/21: 59; 03/12/22: 66; 04/16/22: 67; 04/25/22; 51; 05/21/22: 74; 07/04/22: 74   Time 12    Period Weeks    Status  MET/ongoing    Target Date 04/25/22     OT LONG TERM GOAL #7   Title Pt will demonstrate ability to pick up small objects and complete 9 hole peg test in less than 1 min 30 sec (revised from under 2 min)   Baseline unable to perform at eval, 10th visit: still unable to pick up small pegs, 20th: unable to pick up pegs but improving; 08/17/21: not attempted d/t time constraints, will assess next visit; 09/04/21: 9 hole peg test in 5 min 53 sec;  7/17:  Pt unable to complete this date but was able to place 6 of 9 pegs in 4 mins 20 secs; 12/25/21: L 1 min 43 sec; 03/12/22: L 1 min 2 sec (requires non-skid surface to pick up small items from table top); 04/16/22: 5 pegs in 2 min 36 secs; 04/25/22: 1 min 48 sec; 05/21/22: L 1 min 23 sec; 07/04/22: 1 min 14 sec   Time 12    Period Weeks    Status achieved/On-going    Target Date 07/18/22    OT LONG TERM GOAL #8   Title Pt will increase LUE strength by 1 MM grade in order to hold blow dryer in LUE to dry hair without dropping dryer.   Baseline 04/25/22: Unable to sustain grip and maintain lift of LUE with hand near head without dropping dryer.  Dryer has caused bruising above L eye with previous attempts.  L shoulder flex/abd 3/5, elbow flex/ext 3+/5; 07/04/22: L shoulder flex/abd 3+, elbow and wrist flex/ext 4+   Time 12    Period Weeks    Status New    Target Date 07/18/22  OT LONG TERM GOAL #9   Title Pt will increase L hand dexterity to enable pt to independently manage clothing fasteners.   Baseline 04/25/22: pt ties shoe laces loosely and requires assist from spouse occasionally.  When typing, pt engages the L hand for ~25% of the task.  Pt avoids wearing jeans d/t inability to manage button or zipper; 07/04/22: no change yet from 04/25/22   Time 12    Period Weeks    Status ongoing   Target Date 07/18/22        OT LONG TERM  GOAL #10   Title Pt will be able to independently carry a light plate of food or drink in L hand without spilling/dropping to increase efficiency with item transport in the kitchen.   Baseline 04/25/22: Pt requires constant cues to keep cup or plate level while multitasking in order to prevent spilling; 07/04/22: Pt reports she is carrying her coffee cup from kitchen to neighboring room (not yet consistent to avoid spills)   Time 12    Period Weeks    Status ongoing   Target Date 07/18/22     Plan -     Clinical Impression Statement Pt seen for 110th visit progress update.  L hand grip, pinch, and 9 hole peg test continue to show improvements.  Pt has been working with yellow theraband for strengthening LUE and now presents with L shoulder flex/abd at 3+ (improved), and elbow and wrist flex/ext at 4+ (improved).  Pt has started to use BUEs to move pots/pans in and out of the oven, where she was previously having spouse assist d/t LUE weakness.  Pt did get a minor burn on L thumb with her first attempt at this when her oven mit slipped from the L hand.  Pt continues to present with difficulty repositioning smaller items within palm and fingertips without dropping.  L grip remains weak, but improving, and pt is starting to transport light items around the house with L hand, though pt requires cues to keep a cup level if not looking at her hand.  Pt will continue to work towards goals in plan of care to improve left UE functional use in ADL and IADL tasks at home and in the community.    OT Occupational Profile and History Detailed Assessment- Review of Records and additional review of physical, cognitive, psychosocial history related to current functional performance    Occupational performance deficits (Please refer to evaluation for details): ADL's;IADL's;Leisure;Rest and Sleep    Body Structure / Function / Physical Skills ADL;Coordination;Endurance;GMC;UE functional  use;Balance;IADL;Pain;Dexterity;FMC;Strength;Edema;Mobility;ROM    Psychosocial Skills Environmental  Adaptations;Habits;Routines and Behaviors    Rehab Potential Good    Clinical Decision Making Several treatment options, min-mod task modification necessary   Comorbidities Affecting Occupational Performance: May have comorbidities impacting occupational performance    Modification or Assistance to Complete Evaluation  Min-Moderate modification of tasks or assist with assess necessary to complete eval    OT Frequency 2x / week    OT Duration 12 weeks    OT Treatment/Interventions Self-care/ADL training;Cryotherapy;Paraffin;Therapeutic exercise;DME and/or AE instruction;Functional Mobility Training;Balance training;Electrical Stimulation;Ultrasound;Neuromuscular education;Manual Therapy;Splinting;Moist Heat;Contrast Bath;Passive range of motion;Therapeutic activities;Patient/family education;Coping strategies training    Plan OT recert    Consulted and Agree with Plan of Care Patient           Danelle Earthly, MS, OTR/L  [employer] Dials, OT 07/07/2022, 3:31 PM

## 2022-07-09 ENCOUNTER — Ambulatory Visit: Payer: Medicare PPO

## 2022-07-09 DIAGNOSIS — I63511 Cerebral infarction due to unspecified occlusion or stenosis of right middle cerebral artery: Secondary | ICD-10-CM

## 2022-07-09 DIAGNOSIS — R278 Other lack of coordination: Secondary | ICD-10-CM

## 2022-07-09 DIAGNOSIS — R2681 Unsteadiness on feet: Secondary | ICD-10-CM

## 2022-07-09 DIAGNOSIS — R262 Difficulty in walking, not elsewhere classified: Secondary | ICD-10-CM

## 2022-07-09 DIAGNOSIS — M6281 Muscle weakness (generalized): Secondary | ICD-10-CM | POA: Diagnosis not present

## 2022-07-09 NOTE — Therapy (Signed)
OUTPATIENT OCCUPATIONAL THERAPY NEURO TREATMENT NOTE          Patient Name: Teresa [name] MRN: [id number] DOB:[birth year], 82 y.o., female Today's Date: 05/23/2022  PCP: Dr. Aram Beecham REFERRING PROVIDER: Dr. Aram Beecham   OT End of Session - 07/11/22 0815     Visit Number 111    Number of Visits 117    Date for OT Re-Evaluation 07/18/22    Authorization Time Period Reporting period beginning 07/04/22    Progress Note Due on Visit 10    OT Start Time 1105    OT Stop Time 1145    OT Time Calculation (min) 40 min    Equipment Utilized During Treatment cane    Activity Tolerance Patient tolerated treatment well    Behavior During Therapy WFL for tasks assessed/performed            Past Medical History:  Diagnosis Date   Cancer    skin   Hypothyroidism    Raynaud's disease    Stroke    Past Surgical History:  Procedure Laterality Date   ABDOMINAL HYSTERECTOMY  1991   APPENDECTOMY  1991   BACK SURGERY  2010   Cervical fusion C4-5-6-7   CATARACT EXTRACTION, BILATERAL Bilateral 10/2016   CHOLECYSTECTOMY  1995   COLONOSCOPY WITH PROPOFOL N/A 12/08/2018   Procedure: COLONOSCOPY WITH PROPOFOL;  Surgeon: Toledo, Boykin Nearing, MD;  Location: [facility] ENDOSCOPY;  Service: Gastroenterology;  Laterality: N/A;   EYE SURGERY     Patient Active Problem List   Diagnosis Date Noted   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  Following inpt rehab, pt went home and had  home health.   PRECAUTIONS: fall  SUBJECTIVE: Pt reports she walked a mile yesterday without her L compression glove and no cane.  Pt feels her L hand is more swollen today.  PAIN:  Are you having pain? none  OBJECTIVE:  L grip 6, R grip 41 L lateral pinch 5, R 13 L 3 point pinch 4, R 14  L 9 hole 1 min 43 sec. L shoulder active flexion 0-105, passive 0-125 02/05/22: L grip 13#  L lateral pinch 8#  3 point pinch 5#  L shoulder flex 0-108 03/12/22:  L grip: 12# L lateral pinch: 8# 3 point pinch: 6# L 9 hole: 1 min 3 sec L shoulder flexion 0-110 04/16/22: L grip: 3# L lateral pinch: 4# 3 point pinch: difficulty maintaining 3 point pinch  L 9 hole: 5 pegs in 2 min 36 sec  L shoulder flexion 0-107 BP L 132/79 HR 80 02 sats 96% 04/25/22: L grip: 14#; R grip: 50# L lateral 2#, R lateral 17# 3 point pinch: L 4#, R 14# (Saehan pinch gauge) 9 hole peg test: 1 min, 48 sec L shoulder flex 125 (R shoulder 132 active) 05/21/22: L grip: 15#; R grip: 50# L lateral 8#, R lateral 20# 3 point pinch: L 4#, R 12# (Saehan pinch gauge) 9 hole peg test: 1 min,  23 sec 07/04/22: L grip: 19# L lateral 10# 3 point pinch: L 6# (standard pinch gauge-not Saehan) 9 hole peg test: 1 min, 14 sec  TODAY'S TREATMENT: Manual Therapy: Edema massage completed to L digits, dorsum of the hand, wrist, and forearm for edema reduction.    Therapeutic Exercise: Facilitated L grip strengthening with hand gripper set at 20 # of resistance to complete 3 sets 10 reps.  Performed passive and active ROM at the L forearm, wrist, and digits (each joint) all planes, working to increase ROM and decrease stiffness from increased edema.  Tolerated well.   PATIENT EDUCATION: Education details: Encouraged pt wear L compression glove for long walks in the heat Person educated: pt Education method: explanation Education comprehension: verbalized understanding  HOME EXERCISE PROGRAM Continue to engage LUE into ADLs;  continue use of putty for gripping and pinching exercises for LUE, and L shoulder AROM/AAROM, crocheting, typing; increase participation in IADL tasks for greater use of L hand.    OT Long Term Goals - 06/06/22 (6 weeks)      OT LONG TERM GOAL #1   Title Pt will be independent with home exercise program.    Baseline Eval: no current program, 10th visit:  continue to add new exercises as pt progresses, 20th:  continue to update HEP; 08/17/21: continue to progress HEP when indicated.  Visit 40: adding new exercises as pt progresses; 12/25/21: ongoing with progressions; 03/12/22: inconsistent use of putty but regular use of stress ball (every other day).  Encouraged pt return to daily putty use (2x daily when able) to target grip and pinch strengthening; 04/16/22: not reviewed today d/t OT sent pt to ED; 04/25/22: added pulleys and pt has re-started putty; 05/21/22: stopped pulleys and added stronger theraputty and encouraged pt crochet often; 07/04/22: pt working consistently with putty and grip strengthener at 20# at home    Time 12    Period Weeks    Status On-going    Target Date 06/06/22     OT LONG TERM GOAL #2   Title Pt will complete UB and LB dressing with modified independence including buttons, snaps and zippers.    Baseline requires min assist at eval, 10th visit: occasional assist with buttons, 20th:  able to perform one handed, but difficulty with bilateral UE; 08/17/21: pt reports inconsistent with 1 hand, reviewed techniques this visit, Visit 40:  Pt requires assist with bra   Time 12    Period Weeks    Status MET   Target Date 11/08/21     OT LONG TERM GOAL #3   Title Pt will perform shower transfer with modified independence.    Baseline Pt requires supervision to min assist for shower transfer at home. 10th visit: supervision; 08/17/21: supv .  Visit 40:  Pt had met goal but had recent fall and now requiring min guard to supv   Time 6    Period Weeks    Status  Met    Target Date  11/08/21      OT LONG TERM GOAL #4   Title Pt will improve L hand grip to 20 or more #s to enable pt to open a new jar.   Baseline no grip in left hand at eval, 10th visit:  improved flexion but still working towards composite fisting and grip. 20th:  continues to demo decreased grip; 08/17/21: active digit flexion improving, but not yet able to register grip on dynamometer; 09/04/21: L grip 1# 7/17:  5#; 12/25/21: L  grip 6#; 02/05/22: 13#; 03/12/22: L grip 12#; 1/23//24: L grip 3# (decreased); 04/25/22: L grip 14#; spouse assists to open new jars; 05/21/22: L grip 15#; 07/04/22: L grip 19#    Time 12    Period Weeks    Status Revised/ongoing   Target Date 07/18/22     OT LONG TERM GOAL #5   Title Pt will improve left shoulder flexion to 100 degrees or better to improve reaching to obtain self care items from shelf/shoulder height.    Baseline difficulty with reach, shoulder flexion to 47 degrees; 08/17/21: L shoulder flexion 85, but not yet able to consistently hold ADL supplies in L hand when reaching; 09/04/21: flexion 85 7/17:  shoulder flexion to 90; 12/25/21: 105 (P 125); 03/12/22: active L shoulder flexion 110 (pt uses R arm to reach for items at shoulder height or above); 04/16/22: L shoulder flexion 107; 04/25/22: L shoulder flexion 125*, R 132*    Time 12    Period Weeks    Status achieved   Target Date 04/25/22     OT LONG TERM GOAL #6   Title Pt will improve FOTO score to 47 or above to demonstrate a clinically relevant change in function to impact ADL tasks.    Baseline score of 30 at eval; 08/17/21: FOTO: 42; 09/04/21: FOTO : 54 , FOTO 7/7:  60; 12/25/21: 59; 03/12/22: 66; 04/16/22: 67; 04/25/22; 51; 05/21/22: 74; 07/04/22: 74    Time 12    Period Weeks    Status  MET/ongoing    Target Date 04/25/22     OT LONG TERM GOAL #7   Title Pt will demonstrate ability to pick up small objects and complete 9 hole peg test in less than 1 min 30 sec (revised from under 2 min)   Baseline unable to perform at  eval, 10th visit: still unable to pick up small pegs, 20th: unable to pick up pegs but improving; 08/17/21: not attempted d/t time constraints, will assess next visit; 09/04/21: 9 hole peg test in 5 min 53 sec;  7/17:  Pt unable to complete this date but was able to place 6 of 9 pegs in 4 mins 20 secs; 12/25/21: L 1 min 43 sec; 03/12/22: L 1 min 2 sec (requires non-skid surface to pick up small items from table top); 04/16/22: 5 pegs in 2 min 36 secs; 04/25/22: 1 min 48 sec; 05/21/22: L 1 min 23 sec; 07/04/22: 1 min 14 sec    Time 12    Period Weeks    Status Revised/On-going    Target Date 07/18/22    OT LONG TERM GOAL #8   Title Pt will increase LUE strength by 1 MM grade in order to hold blow dryer in LUE to dry hair without dropping dryer.   Baseline 04/25/22: Unable to sustain grip and maintain lift of LUE with hand near head without dropping dryer.  Dryer has caused bruising above L eye with previous attempts.  L shoulder flex/abd 3/5, elbow flex/ext 3+/5; 07/04/22: L shoulder flex/abd 3+, elbow and wrist flex/ext 4+    Time 12    Period Weeks    Status New/ ongoing   Target Date 07/18/22        OT LONG TERM GOAL #9   Title Pt will increase L hand dexterity to enable pt to independently manage clothing fasteners.   Baseline 04/25/22: pt ties shoe laces loosely and requires assist from spouse occasionally.  When typing, pt engages the L hand  for ~25% of the task.  Pt avoids wearing jeans d/t inability to manage button or zipper; 07/04/22: no change yet from 04/25/22    Time 12    Period Weeks    Status New    Target Date 07/18/22        OT LONG TERM GOAL #10   Title Pt will be able to independently carry a light plate of food or drink in L hand without spilling/dropping to increase efficiency with item transport in the kitchen.   Baseline 04/25/22: Pt requires constant cues to keep cup or plate level while multitasking in order to prevent spilling; 07/04/22: Pt reports she is carrying her coffee cup from  kitchen to neighboring room (not yet consistent to avoid spills)    Time 12    Period Weeks    Status New/ongoing   Target Date 07/18/22     Plan -     Clinical Impression Statement Pt reports she walked a mile yesterday without her L compression glove and no cane.  Pt feels her L hand is more swollen today.  Beginning of session pt with increased difficulty making full composite fist.  Focused on edema massage to L hand and digits this date, and passive/AROM to L wrist and hand, working to decrease edema.  End of session pt able to make full composite fist with ease.  Encouraged pt make sure to don her compression glove for long walks in the heat.  Pt verbalized understanding.  Pt will continue to work towards goals in plan of care to improve left UE functional use in ADL and IADL tasks at home and in the community.    OT Occupational Profile and History Detailed Assessment- Review of Records and additional review of physical, cognitive, psychosocial history related to current functional performance    Occupational performance deficits (Please refer to evaluation for details): ADL's;IADL's;Leisure;Rest and Sleep    Body Structure / Function / Physical Skills ADL;Coordination;Endurance;GMC;UE functional use;Balance;IADL;Pain;Dexterity;FMC;Strength;Edema;Mobility;ROM    Psychosocial Skills Environmental  Adaptations;Habits;Routines and Behaviors    Rehab Potential Good    Clinical Decision Making Several treatment options, min-mod task modification necessary   Comorbidities Affecting Occupational Performance: May have comorbidities impacting occupational performance    Modification or Assistance to Complete Evaluation  Min-Moderate modification of tasks or assist with assess necessary to complete eval    OT Frequency 2x / week    OT Duration 12 weeks    OT Treatment/Interventions Self-care/ADL training;Cryotherapy;Paraffin;Therapeutic exercise;DME and/or AE instruction;Functional Mobility  Training;Balance training;Electrical Stimulation;Ultrasound;Neuromuscular education;Manual Therapy;Splinting;Moist Heat;Contrast Bath;Passive range of motion;Therapeutic activities;Patient/family education;Coping strategies training    Plan OT recert    Consulted and Agree with Plan of Care Patient           Danelle Earthly, MS, OTR/L  Otis Dials, OT 07/11/2022, 8:22 AM

## 2022-07-09 NOTE — Therapy (Signed)
OUTPATIENT PHYSICAL THERAPY NEURO TREATMENT/Physical Therapy Progress Note   Dates of reporting period  05/23/2022   to   07/09/2022    Patient Name: Teresa [name] MRN: [id number] DOB:Apr 14, 1940, 82 y.o., female Today's Date: 07/09/2022   PCP: Dr. Aram Beecham REFERRING PROVIDER: Dr. Aram Beecham  END OF SESSION:  PT End of Session - 07/09/22 1012     Visit Number 10    Number of Visits 24    Date for PT Re-Evaluation 08/15/22    Authorization Type [employer] PPO-    Authorization Time Period 05/23/2022- 08/15/2022    Progress Note Due on Visit 10    PT Start Time 1016    PT Stop Time 1056    PT Time Calculation (min) 40 min    Equipment Utilized During Treatment Gait belt    Activity Tolerance Patient tolerated treatment well;No increased pain    Behavior During Therapy WFL for tasks assessed/performed                Past Medical History:  Diagnosis Date   Cancer    skin   Hypothyroidism    Raynaud's disease    Stroke    Past Surgical History:  Procedure Laterality Date   ABDOMINAL HYSTERECTOMY  1991   APPENDECTOMY  1991   BACK SURGERY  2010   Cervical fusion C4-5-6-7   CATARACT EXTRACTION, BILATERAL Bilateral 10/2016   CHOLECYSTECTOMY  1995   COLONOSCOPY WITH PROPOFOL N/A 12/08/2018   Procedure: COLONOSCOPY WITH PROPOFOL;  Surgeon: Toledo, Boykin Nearing, MD;  Location: [facility] ENDOSCOPY;  Service: Gastroenterology;  Laterality: N/A;   EYE SURGERY     Patient Active Problem List   Diagnosis Date Noted   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   Stroke 02/27/2021   History of nonmelanoma skin cancer 05/23/2014    ONSET DATE: 04/16/2022  REFERRING DIAG: 69.354 (ICD-10-CM) - Hemiplegia and hemiparesis following cerebral infarction affecting left non-dominant side   THERAPY DIAG:  Other lack of coordination  Unsteadiness on feet  Difficulty in walking, not elsewhere classified  Muscle weakness (generalized)  Rationale for Evaluation  and Treatment: Rehabilitation  SUBJECTIVE:                                                                                                                                                                                             SUBJECTIVE STATEMENT: Pt reports no new aches or pain. She is still feeling off balance, swaying into objects when walking. Pt reports difficulty navigating corners.     Pt accompanied by: significant other  PERTINENT HISTORY: Pt is a 82 y.o. female with  referral for hemiplegia with left sided weakness -original CVA on 02/26/21 and new onset of left sided weakness on 04/16/2022.  R MCA CVA on 02/26/21. Pt completed outpatient PT on 01/17/2022 with majority of goals met. PMH includes: skin cancer, hypothyroidism, and Raynaud's disease, history of cervical fusion in 2010 C4-C7.   PAIN:  Are you having pain? No  PRECAUTIONS: Fall  WEIGHT BEARING RESTRICTIONS: No  FALLS: Has patient fallen in last 6 months? No  LIVING ENVIRONMENT: Lives with: lives with their spouse Lives in: House/apartment Stairs: Yes: External: 1 steps; none Has following equipment at home: Single point cane, Walker - 2 wheeled, shower chair, and Grab bars  PLOF: Independent  PATIENT GOALS: I want to walk without this cane and no falls and enjoy going out to grandkids sporting event  OBJECTIVE:   DIAGNOSTIC FINDINGS: CLINICAL DATA:  Stroke suspected   EXAM: MRI HEAD WITHOUT CONTRAST   TECHNIQUE: Multiplanar, multiecho pulse sequences of the brain and surrounding structures were obtained without intravenous contrast.   COMPARISON:  MRI Head 02/26/21, CT Head 04/16/22   FINDINGS: Brain: Evolving right MCA territory infarct involving the right frontal operculum in the right insular cortex. No acute infarction, hemorrhage, hydrocephalus, extra-axial collection or mass lesion. Small focus of microhemorrhage in the left temporal lobe. There is petechial hemorrhage and/or hemosiderosis in  the region of prior right MCA territory infarct. T2/stir hyperintense signal in the right cerebral peduncle is favored to represent transneuronal degeneration.   Vascular: Major flow voids are preserved.   Skull and upper cervical spine: Normal marrow signal.   Sinuses/Orbits: Bilateral lens replacement. No mastoid or middle ear effusion. Paranasal sinuses are clear.   Other: None.   IMPRESSION: 1. No acute intracranial abnormality. 2. 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 3.     Electronically Signed   By: Lorenza Cambridge M.D.   On: 04/16/2022 13:41  COGNITION: Overall cognitive status: Within functional limits for tasks assessed   SENSATION: WFL  COORDINATION: Intact with each LE  EDEMA:  None observed  MUSCLE TONE: LLE: Within functional limits  DTRs:  Patella 2+ = Normal and Achilles 2+ = Normal  POSTURE: rounded shoulders and forward head  LOWER EXTREMITY ROM:     Active  Right Eval Left Eval  Hip flexion WNL THROUGHOUT LE WNL THROUGHOUT LE  Hip extension    Hip abduction    Hip adduction    Hip internal rotation    Hip external rotation    Knee flexion    Knee extension    Ankle dorsiflexion    Ankle plantarflexion    Ankle inversion    Ankle eversion     (Blank rows = not tested)  LOWER EXTREMITY MMT:    MMT Right Eval Left Eval  Hip flexion 4+ 4  Hip extension 4+ 4  Hip abduction 4+ 4  Hip adduction 4+ 4  Hip internal rotation 4+ 4  Hip external rotation 4+ 4  Knee flexion 4+ 4  Knee extension 5 4  Ankle dorsiflexion 5 4  Ankle plantarflexion    Ankle inversion    Ankle eversion    (Blank rows = not tested)  BED MOBILITY:  Patient reports independent with all bed mobility and states has returned to master bed  TRANSFERS: Assistive device utilized: None  Sit to stand: Complete Independence Stand to sit: Complete Independence Chair to chair: Complete  Independence Floor:  Not tested   GAIT: Gait  pattern: step through pattern, decreased arm swing- Right, decreased arm swing- Left, decreased step length- Right, and decreased step length- Left Distance walked: 915 feet Assistive device utilized: None Level of assistance: SBA Comments: occasional scuff or decreased left foot clearance  FUNCTIONAL TESTS:  5 times sit to stand: 23.62 sec without UE support Timed up and go (TUG): 20.14 sec without an AD 10 meter walk test: 0.70 m/s  PATIENT SURVEYS:  FOTO 60/ goal of 66  TODAY'S TREATMENT:                                                                                                                              DATE: 07/09/22  Gait belt donned and CGA provided unless otherwise specified  TherAct: Goal retesting completed on this date. Please refer to goal section below for details. PT instructed pt in technique with tests and indications of test performance on progress and plan. Pt verbalized understanding and demonstrated correct technique.    Neuro Re-Ed: completed near support surface  Obstacle course with and without dual cog and motor task: pt navigated around multiple cones (narrow path) x multiple reps. Cuing throughout to avoid bumping L foot/LUE into nearby surface. Pt knocks over cones 3x. Increased difficult with dual task.   Therex:  Lunge squat walk down and back x 8 length of // bars with BUE support  STS  with airex pad under right LE 2x8 reps without UE support. Cuing for anterior lean. Requests rest break following second set.      PATIENT EDUCATION: Education details: Pt educated throughout session about proper posture and technique with exercises. Improved exercise technique, movement at target joints, use of target muscles after min to mod verbal, visual, tactile cues.  Person educated: Patient Education method: Explanation, Demonstration, Tactile cues, and Verbal cues Education comprehension:  verbalized understanding, returned demonstration, verbal cues required, tactile cues required, and needs further education  HOME EXERCISE PROGRAM: To be updated next 1-2 visit  GOALS: Goals reviewed with patient? Yes  SHORT TERM GOALS: Target date: 07/04/2022  Patient will be independent in home exercise program to improve strength/mobility for better functional independence with ADLs  Baseline: EVAL: Patient reports mostly just walking; 07/09/22: Pt reports her exercises are going well, to be updated future date Goal status: IN PROGRESS  LONG TERM GOALS: Target date: 08/14/2021  Pt will decrease 5TSTS by at least 6 seconds in order to demonstrate clinically significant improvement in LE strength. Baseline: EVAL= 23.62 sec without UE; 07/09/22:  13 seconds hands-free  Goal status: MET  2. Pt will improve FOTO to target score of 66 to display perceived improvements in ability to complete ADL's.  Baseline: Eval= 60; 07/09/22: 65 Goal status: PARTIALLY MET  3.  Pt will decrease TUG to below 14 seconds/decrease in order to demonstrate decreased fall risk. Baseline: Eval= 20.14 sec without AD; 07/09/22: 11 seconds no AD Goal status: MET  4.  Patient will increase six minute  walk test distance to >1200 ft for improve gait ability and return to PLOF to complete her walks in her neighborhood.  Baseline: EVAL= 915 feet without an AD; 07/09/22: 1184 ft without AD Goal status: Partially MET  5. Pt will increase by at least 0.13 m/s in order to demonstrate clinically significant improvement in community ambulation.    Baseline: Eval = 0.70 m/s; 07/09/2022: 1.04 m/s without AD   Goal status: MET   6. Pt will report that she has not hit LLE/LUE on doorframe/walls/furniture while ambulating in the past two weeks to indicate improved ability to scan environment and to decrease injury risk with gait.  Baseline: currently has hit furniture/doorframe 2x/week  Goal status: NEW    ASSESSMENT:  CLINICAL IMPRESSION: Goals reassessed for progress note. The pt is making gains AEB meeting 5xSTS, , and TUG goals as well as partially meeting and FOTO goals. These gains indicated decrease fall risk, increased BLE strength, gait capacity/speed/ability and improved functional mobility and QOL. While pt making gains, she is still reporting difficulty with navigating around objects and furniture without hitting her LUE/LLE. PT has added goal to address this deficit. The pt will benefit from skilled PT services to improve her overall left LE muscle strength and improve her mobility to decrease her fall risk and improve her overall quality of life.   OBJECTIVE IMPAIRMENTS: Abnormal gait, decreased activity tolerance, decreased balance, decreased coordination, decreased endurance, decreased mobility, difficulty walking, and decreased strength.   ACTIVITY LIMITATIONS: carrying, lifting, bending, standing, squatting, stairs, and transfers  PARTICIPATION LIMITATIONS: cleaning, laundry, driving, shopping, community activity, and yard work  PERSONAL FACTORS: 3+ comorbidities: OA, HTN, hx of previous CVA  are also affecting patient's functional outcome.   REHAB POTENTIAL: Good  CLINICAL DECISION MAKING: Evolving/moderate complexity  EVALUATION COMPLEXITY: Moderate  PLAN:  PT FREQUENCY: 1-2x/week  PT DURATION: 12 weeks  PLANNED INTERVENTIONS: Therapeutic exercises, Therapeutic activity, Neuromuscular re-education, Balance training, Gait training, Patient/Family education, Self Care, Joint mobilization, Joint manipulation, Stair training, Vestibular training, Canalith repositioning, DME instructions, Dry Needling, Electrical stimulation, Spinal manipulation, Spinal mobilization, Cryotherapy, Moist heat, Taping, and Manual therapy  PLAN FOR NEXT SESSION: Progressive balance training and LE strengthening and add to HEP as appropriate next session.   Temple Pacini PT,  DPT  Physical Therapist- [facility]  07/09/2022, 2:23 PM

## 2022-07-11 ENCOUNTER — Ambulatory Visit: Payer: Medicare PPO

## 2022-07-11 NOTE — Therapy (Signed)
OUTPATIENT PHYSICAL THERAPY NEURO TREATMENT   Patient Name: Teresa [name] MRN: [id number] DOB:01-26-1941, 82 y.o., female Today's Date: 07/12/2022   PCP: Dr. Aram Beecham REFERRING PROVIDER: Dr. Aram Beecham  END OF SESSION:  PT End of Session - 07/12/22 1014     Visit Number 11    Number of Visits 24    Date for PT Re-Evaluation 08/15/22    Authorization Type [employer] PPO-    Authorization Time Period 05/23/2022- 08/15/2022    Progress Note Due on Visit 10    PT Start Time 0930    PT Stop Time 1010    PT Time Calculation (min) 40 min    Equipment Utilized During Treatment Gait belt    Activity Tolerance Patient tolerated treatment well;No increased pain    Behavior During Therapy WFL for tasks assessed/performed                 Past Medical History:  Diagnosis Date   Cancer    skin   Hypothyroidism    Raynaud's disease    Stroke    Past Surgical History:  Procedure Laterality Date   ABDOMINAL HYSTERECTOMY  1991   APPENDECTOMY  1991   BACK SURGERY  2010   Cervical fusion C4-5-6-7   CATARACT EXTRACTION, BILATERAL Bilateral 10/2016   CHOLECYSTECTOMY  1995   COLONOSCOPY WITH PROPOFOL N/A 12/08/2018   Procedure: COLONOSCOPY WITH PROPOFOL;  Surgeon: Toledo, Boykin Nearing, MD;  Location: [facility] ENDOSCOPY;  Service: Gastroenterology;  Laterality: N/A;   EYE SURGERY     Patient Active Problem List   Diagnosis Date Noted   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   Stroke 02/27/2021   History of nonmelanoma skin cancer 05/23/2014    ONSET DATE: 04/16/2022  REFERRING DIAG: 69.354 (ICD-10-CM) - Hemiplegia and hemiparesis following cerebral infarction affecting left non-dominant side   THERAPY DIAG:  Unsteadiness on feet  Difficulty in walking, not elsewhere classified  Other lack of coordination  Muscle weakness (generalized)  Right middle cerebral artery stroke  Chronic left shoulder pain  Rationale for Evaluation and Treatment:  Rehabilitation  SUBJECTIVE:                                                                                                                                                                                             SUBJECTIVE STATEMENT: Patient reports walking at least 3x/week- having some dragging of her left LE with walking.     Pt accompanied by: significant other  PERTINENT HISTORY: Pt is a 82 y.o. female with referral for hemiplegia with left sided weakness -original CVA on 02/26/21 and new onset  of left sided weakness on 04/16/2022.  R MCA CVA on 02/26/21. Pt completed outpatient PT on 01/17/2022 with majority of goals met. PMH includes: skin cancer, hypothyroidism, and Raynaud's disease, history of cervical fusion in 2010 C4-C7.   PAIN:  Are you having pain? No  PRECAUTIONS: Fall  WEIGHT BEARING RESTRICTIONS: No  FALLS: Has patient fallen in last 6 months? No  LIVING ENVIRONMENT: Lives with: lives with their spouse Lives in: House/apartment Stairs: Yes: External: 1 steps; none Has following equipment at home: Single point cane, Walker - 2 wheeled, shower chair, and Grab bars  PLOF: Independent  PATIENT GOALS: I want to walk without this cane and no falls and enjoy going out to grandkids sporting event  OBJECTIVE:   DIAGNOSTIC FINDINGS: CLINICAL DATA:  Stroke suspected   EXAM: MRI HEAD WITHOUT CONTRAST   TECHNIQUE: Multiplanar, multiecho pulse sequences of the brain and surrounding structures were obtained without intravenous contrast.   COMPARISON:  MRI Head 02/26/21, CT Head 04/16/22   FINDINGS: Brain: Evolving right MCA territory infarct involving the right frontal operculum in the right insular cortex. No acute infarction, hemorrhage, hydrocephalus, extra-axial collection or mass lesion. Small focus of microhemorrhage in the left temporal lobe. There is petechial hemorrhage and/or hemosiderosis in the region of prior right MCA territory infarct. T2/stir  hyperintense signal in the right cerebral peduncle is favored to represent transneuronal degeneration.   Vascular: Major flow voids are preserved.   Skull and upper cervical spine: Normal marrow signal.   Sinuses/Orbits: Bilateral lens replacement. No mastoid or middle ear effusion. Paranasal sinuses are clear.   Other: None.   IMPRESSION: 1. No acute intracranial abnormality. 2. 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 3.     Electronically Signed   By: Lorenza Cambridge M.D.   On: 04/16/2022 13:41  COGNITION: Overall cognitive status: Within functional limits for tasks assessed   SENSATION: WFL  COORDINATION: Intact with each LE  EDEMA:  None observed  MUSCLE TONE: LLE: Within functional limits  DTRs:  Patella 2+ = Normal and Achilles 2+ = Normal  POSTURE: rounded shoulders and forward head  LOWER EXTREMITY ROM:     Active  Right Eval Left Eval  Hip flexion WNL THROUGHOUT LE WNL THROUGHOUT LE  Hip extension    Hip abduction    Hip adduction    Hip internal rotation    Hip external rotation    Knee flexion    Knee extension    Ankle dorsiflexion    Ankle plantarflexion    Ankle inversion    Ankle eversion     (Blank rows = not tested)  LOWER EXTREMITY MMT:    MMT Right Eval Left Eval  Hip flexion 4+ 4  Hip extension 4+ 4  Hip abduction 4+ 4  Hip adduction 4+ 4  Hip internal rotation 4+ 4  Hip external rotation 4+ 4  Knee flexion 4+ 4  Knee extension 5 4  Ankle dorsiflexion 5 4  Ankle plantarflexion    Ankle inversion    Ankle eversion    (Blank rows = not tested)  BED MOBILITY:  Patient reports independent with all bed mobility and states has returned to master bed  TRANSFERS: Assistive device utilized: None  Sit to stand: Complete Independence Stand to sit: Complete Independence Chair to chair: Complete Independence Floor:  Not tested   GAIT: Gait pattern: step through  pattern, decreased arm swing- Right, decreased arm swing- Left, decreased step  length- Right, and decreased step length- Left Distance walked: 915 feet Assistive device utilized: None Level of assistance: SBA Comments: occasional scuff or decreased left foot clearance  FUNCTIONAL TESTS:  5 times sit to stand: 23.62 sec without UE support Timed up and go (TUG): 20.14 sec without an AD 10 meter walk test: 0.70 m/s  PATIENT SURVEYS:  FOTO 60/ goal of 66  TODAY'S TREATMENT:                                                                                                                              DATE: 07/12/22  Gait belt donned and CGA provided unless otherwise specified  TherAct:  Patient ambulated with SPC on right - across stable and unstable surface outside. Negotiating changing surfaces from grass, sidewalk, across brick with turns and obstacles in pathway including road construction cones, water hose, rocks in path, etc.. She performed inclines, curbs, and declines - occasional left foot dragging yet no LOB today.  She also ambulated indoor in hospital with [facility]- using scanning technique to avoid any left sided neglect- able to call out any sign on wall- like names of departments, etc... - good ability to dynamically turn head and maintain balance. Only 1 episode of left side neglect as she caught her left arm on door as she was opening with left UE.   Steps - Patient negotiated steps from lower level up to 1st level  with use of 1 rail and CGA - step over step technique with ascending and later descending.   Total time walking = 40 min > 0.75 mi      PATIENT EDUCATION: Education details: Pt educated throughout session about proper posture and technique with exercises. Improved exercise technique, movement at target joints, use of target muscles after min to mod verbal, visual, tactile cues.  Person educated: Patient Education method: Explanation, Demonstration, Tactile cues, and  Verbal cues Education comprehension: verbalized understanding, returned demonstration, verbal cues required, tactile cues required, and needs further education  HOME EXERCISE PROGRAM: To be updated next 1-2 visit  GOALS: Goals reviewed with patient? Yes  SHORT TERM GOALS: Target date: 07/04/2022  Patient will be independent in home exercise program to improve strength/mobility for better functional independence with ADLs  Baseline: EVAL: Patient reports mostly just walking; 07/09/22: Pt reports her exercises are going well, to be updated future date Goal status: IN PROGRESS  LONG TERM GOALS: Target date: 08/14/2021  Pt will decrease 5TSTS by at least 6 seconds in order to demonstrate clinically significant improvement in LE strength. Baseline: EVAL= 23.62 sec without UE; 07/09/22:  13 seconds hands-free  Goal status: MET  2. Pt will improve FOTO to target score of 66 to display perceived improvements in ability to complete ADL's.  Baseline: Eval= 60; 07/09/22: 65 Goal status: PARTIALLY MET  3.  Pt will decrease TUG to below 14 seconds/decrease in order to demonstrate decreased fall risk. Baseline: Eval= 20.14 sec without AD; 07/09/22:  11 seconds no AD Goal status: MET  4.  Patient will increase six minute walk test distance to >1200 ft for improve gait ability and return to PLOF to complete her walks in her neighborhood.  Baseline: EVAL= 915 feet without an AD; 07/09/22: 1184 ft without AD Goal status: Partially MET  5. Pt will increase by at least 0.13 m/s in order to demonstrate clinically significant improvement in community ambulation.    Baseline: Eval = 0.70 m/s; 07/09/2022: 1.04 m/s without AD   Goal status: MET   6. Pt will report that she has not hit LLE/LUE on doorframe/walls/furniture while ambulating in the past two weeks to indicate improved ability to scan environment and to decrease injury risk with gait.  Baseline: currently has hit furniture/doorframe  2x/week  Goal status: NEW   ASSESSMENT:  CLINICAL IMPRESSION: Treatment continued per most recent assessment- Patient was mostly able to negotiate around objects in pathway with only minimal left LE drag but did have 1 episode of left side neglect while negotiating door to step and caught her left arm on door. Otherwise she performed very well on multiple surfaces and able to step over/around objects with no difficulty with curbs or steps today.  The pt will benefit from skilled PT services to improve her overall left LE muscle strength and improve her mobility to decrease her fall risk and improve her overall quality of life.   OBJECTIVE IMPAIRMENTS: Abnormal gait, decreased activity tolerance, decreased balance, decreased coordination, decreased endurance, decreased mobility, difficulty walking, and decreased strength.   ACTIVITY LIMITATIONS: carrying, lifting, bending, standing, squatting, stairs, and transfers  PARTICIPATION LIMITATIONS: cleaning, laundry, driving, shopping, community activity, and yard work  PERSONAL FACTORS: 3+ comorbidities: OA, HTN, hx of previous CVA  are also affecting patient's functional outcome.   REHAB POTENTIAL: Good  CLINICAL DECISION MAKING: Evolving/moderate complexity  EVALUATION COMPLEXITY: Moderate  PLAN:  PT FREQUENCY: 1-2x/week  PT DURATION: 12 weeks  PLANNED INTERVENTIONS: Therapeutic exercises, Therapeutic activity, Neuromuscular re-education, Balance training, Gait training, Patient/Family education, Self Care, Joint mobilization, Joint manipulation, Stair training, Vestibular training, Canalith repositioning, DME instructions, Dry Needling, Electrical stimulation, Spinal manipulation, Spinal mobilization, Cryotherapy, Moist heat, Taping, and Manual therapy  PLAN FOR NEXT SESSION: Progressive balance training and LE strengthening and add to HEP as appropriate next session.   Louis Meckel, PT Physical Therapist- [facility]  07/12/2022, 11:13 AM

## 2022-07-12 ENCOUNTER — Ambulatory Visit: Payer: Medicare PPO

## 2022-07-12 DIAGNOSIS — M6281 Muscle weakness (generalized): Secondary | ICD-10-CM

## 2022-07-12 DIAGNOSIS — R262 Difficulty in walking, not elsewhere classified: Secondary | ICD-10-CM

## 2022-07-12 DIAGNOSIS — R278 Other lack of coordination: Secondary | ICD-10-CM

## 2022-07-12 DIAGNOSIS — G8929 Other chronic pain: Secondary | ICD-10-CM

## 2022-07-12 DIAGNOSIS — I63511 Cerebral infarction due to unspecified occlusion or stenosis of right middle cerebral artery: Secondary | ICD-10-CM

## 2022-07-12 DIAGNOSIS — R2681 Unsteadiness on feet: Secondary | ICD-10-CM

## 2022-07-12 NOTE — Therapy (Signed)
OUTPATIENT OCCUPATIONAL THERAPY NEURO TREATMENT NOTE          Patient Name: Teresa [name] MRN: [id number] DOB:16-Sep-1940, 82 y.o., female Today's Date: 05/23/2022  PCP: Dr. Aram Beecham REFERRING PROVIDER: Dr. Aram Beecham   OT End of Session - 07/12/22 1015     Visit Number 112    Number of Visits 117    Date for OT Re-Evaluation 07/18/22    Authorization Time Period Reporting period beginning 07/04/22    Progress Note Due on Visit 10    OT Start Time 1015    OT Stop Time 1100    OT Time Calculation (min) 45 min    Equipment Utilized During Treatment cane    Activity Tolerance Patient tolerated treatment well    Behavior During Therapy WFL for tasks assessed/performed            Past Medical History:  Diagnosis Date   Cancer    skin   Hypothyroidism    Raynaud's disease    Stroke    Past Surgical History:  Procedure Laterality Date   ABDOMINAL HYSTERECTOMY  1991   APPENDECTOMY  1991   BACK SURGERY  2010   Cervical fusion C4-5-6-7   CATARACT EXTRACTION, BILATERAL Bilateral 10/2016   CHOLECYSTECTOMY  1995   COLONOSCOPY WITH PROPOFOL N/A 12/08/2018   Procedure: COLONOSCOPY WITH PROPOFOL;  Surgeon: Toledo, Boykin Nearing, MD;  Location: [facility] ENDOSCOPY;  Service: Gastroenterology;  Laterality: N/A;   EYE SURGERY     Patient Active Problem List   Diagnosis Date Noted   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  Rationale for Evaluation and Treatment Rehabilitation  PERTINENT HISTORY: February 26, 2021, pt reports she had a CVA, came to [facility] to ER and then was transferred to [facility] in [place] where she was admitted and after acute care she went to inpatient rehab.  Following inpt rehab, pt went home and had home health.   PRECAUTIONS: fall  SUBJECTIVE: Pt is fatigued after walking  40 min at PT, reports L hand remains swollen.   PAIN:  Are you having pain? none  OBJECTIVE:  L grip 6, R grip 41 L lateral pinch 5, R 13 L 3 point pinch 4, R 14  L 9 hole 1 min 43 sec. L shoulder active flexion 0-105, passive 0-125 02/05/22: L grip 13#  L lateral pinch 8#  3 point pinch 5#  L shoulder flex 0-108 03/12/22:  L grip: 12# L lateral pinch: 8# 3 point pinch: 6# L 9 hole: 1 min 3 sec L shoulder flexion 0-110 04/16/22: L grip: 3# L lateral pinch: 4# 3 point pinch: difficulty maintaining 3 point pinch  L 9 hole: 5 pegs in 2 min 36 sec  L shoulder flexion 0-107 BP L 132/79 HR 80 02 sats 96% 04/25/22: L grip: 14#; R grip: 50# L lateral 2#, R lateral 17# 3 point pinch: L 4#, R 14# (Saehan pinch gauge) 9 hole peg test: 1 min, 48 sec L shoulder flex 125 (R shoulder 132 active) 05/21/22: L grip: 15#; R grip: 50# L lateral 8#, R lateral 20# 3 point pinch: L 4#, R 12# (Saehan pinch gauge) 9 hole peg test: 1 min, 23 sec 07/04/22: L grip: 19# L lateral 10# 3 point pinch: L 6# (standard pinch gauge-not Saehan) 9 hole peg  test: 1 min, 14 sec  TODAY'S TREATMENT: Manual Therapy: Edema massage completed to L digits, dorsum of the hand, wrist, and forearm for edema reduction.    Therapeutic Exercise: Facilitated L grip strengthening with hand gripper set at 20 # of resistance to complete 1 set 10 reps.  Performed passive and active ROM at the L forearm, wrist, and digits (each joint) all planes, working to increase ROM and decrease stiffness from increased edema.  Tolerated well.  Therapeutic Activity: Pt removed scrabble tiles from container, focus on 3 point pinch to select 1 tile only and manipulate in hand to place letter side up onto table. Pt used L hand only to arrange tiles to spell 4 letter words. Increased difficulty picking tiles up from table, slides tiles to edge of table. Stores up to 5 tiles in palm, occasionally drops tiles out of ulnar side of palm. Pt  trialed use of small connecting beads for [facility] skills. Successfully picks beads out of container with L hand however difficulty grasping. Use of L hand to pinch beads with R hand connecting new beads.   PATIENT EDUCATION: Education details: Encouraged pt wear L compression glove for long walks in the heat Person educated: pt Education method: explanation Education comprehension: verbalized understanding  HOME EXERCISE PROGRAM Continue to engage LUE into ADLs; continue use of putty for gripping and pinching exercises for LUE, and L shoulder AROM/AAROM, crocheting, typing; increase participation in IADL tasks for greater use of L hand.    OT Long Term Goals - 06/06/22 (6 weeks)      OT LONG TERM GOAL #1   Title Pt will be independent with home exercise program.    Baseline Eval: no current program, 10th visit:  continue to add new exercises as pt progresses, 20th:  continue to update HEP; 08/17/21: continue to progress HEP when indicated.  Visit 40: adding new exercises as pt progresses; 12/25/21: ongoing with progressions; 03/12/22: inconsistent use of putty but regular use of stress ball (every other day).  Encouraged pt return to daily putty use (2x daily when able) to target grip and pinch strengthening; 04/16/22: not reviewed today d/t OT sent pt to ED; 04/25/22: added pulleys and pt has re-started putty; 05/21/22: stopped pulleys and added stronger theraputty and encouraged pt crochet often; 07/04/22: pt working consistently with putty and grip strengthener at 20# at home    Time 12    Period Weeks    Status On-going    Target Date 06/06/22     OT LONG TERM GOAL #2   Title Pt will complete UB and LB dressing with modified independence including buttons, snaps and zippers.    Baseline requires min assist at eval, 10th visit: occasional assist with buttons, 20th:  able to perform one handed, but difficulty with bilateral UE; 08/17/21: pt reports inconsistent with 1 hand, reviewed techniques this  visit, Visit 40:  Pt requires assist with bra   Time 12    Period Weeks    Status MET   Target Date 11/08/21     OT LONG TERM GOAL #3   Title Pt will perform shower transfer with modified independence.    Baseline Pt requires supervision to min assist for shower transfer at home. 10th visit: supervision; 08/17/21: supv .  Visit 40:  Pt had met goal but had recent fall and now requiring min guard to supv   Time 6    Period Weeks    Status  Met    Target  Date 11/08/21      OT LONG TERM GOAL #4   Title Pt will improve L hand grip to 20 or more #s to enable pt to open a new jar.   Baseline no grip in left hand at eval, 10th visit:  improved flexion but still working towards composite fisting and grip. 20th:  continues to demo decreased grip; 08/17/21: active digit flexion improving, but not yet able to register grip on dynamometer; 09/04/21: L grip 1# 7/17:  5#; 12/25/21: L grip 6#; 02/05/22: 13#; 03/12/22: L grip 12#; 1/23//24: L grip 3# (decreased); 04/25/22: L grip 14#; spouse assists to open new jars; 05/21/22: L grip 15#; 07/04/22: L grip 19#    Time 12    Period Weeks    Status Revised/ongoing   Target Date 07/18/22     OT LONG TERM GOAL #5   Title Pt will improve left shoulder flexion to 100 degrees or better to improve reaching to obtain self care items from shelf/shoulder height.    Baseline difficulty with reach, shoulder flexion to 47 degrees; 08/17/21: L shoulder flexion 85, but not yet able to consistently hold ADL supplies in L hand when reaching; 09/04/21: flexion 85 7/17:  shoulder flexion to 90; 12/25/21: 105 (P 125); 03/12/22: active L shoulder flexion 110 (pt uses R arm to reach for items at shoulder height or above); 04/16/22: L shoulder flexion 107; 04/25/22: L shoulder flexion 125*, R 132*    Time 12    Period Weeks    Status achieved   Target Date 04/25/22     OT LONG TERM GOAL #6   Title Pt will improve FOTO score to 47 or above to demonstrate a clinically relevant change in  function to impact ADL tasks.    Baseline score of 30 at eval; 08/17/21: FOTO: 42; 09/04/21: FOTO : 54 , FOTO 7/7:  60; 12/25/21: 59; 03/12/22: 66; 04/16/22: 67; 04/25/22; 51; 05/21/22: 74; 07/04/22: 74    Time 12    Period Weeks    Status  MET/ongoing    Target Date 04/25/22     OT LONG TERM GOAL #7   Title Pt will demonstrate ability to pick up small objects and complete 9 hole peg test in less than 1 min 30 sec (revised from under 2 min)   Baseline unable to perform at eval, 10th visit: still unable to pick up small pegs, 20th: unable to pick up pegs but improving; 08/17/21: not attempted d/t time constraints, will assess next visit; 09/04/21: 9 hole peg test in 5 min 53 sec;  7/17:  Pt unable to complete this date but was able to place 6 of 9 pegs in 4 mins 20 secs; 12/25/21: L 1 min 43 sec; 03/12/22: L 1 min 2 sec (requires non-skid surface to pick up small items from table top); 04/16/22: 5 pegs in 2 min 36 secs; 04/25/22: 1 min 48 sec; 05/21/22: L 1 min 23 sec; 07/04/22: 1 min 14 sec    Time 12    Period Weeks    Status Revised/On-going    Target Date 07/18/22    OT LONG TERM GOAL #8   Title Pt will increase LUE strength by 1 MM grade in order to hold blow dryer in LUE to dry hair without dropping dryer.   Baseline 04/25/22: Unable to sustain grip and maintain lift of LUE with hand near head without dropping dryer.  Dryer has caused bruising above L eye with previous attempts.  L shoulder flex/abd  3/5, elbow flex/ext 3+/5; 07/04/22: L shoulder flex/abd 3+, elbow and wrist flex/ext 4+    Time 12    Period Weeks    Status New/ ongoing   Target Date 07/18/22        OT LONG TERM GOAL #9   Title Pt will increase L hand dexterity to enable pt to independently manage clothing fasteners.   Baseline 04/25/22: pt ties shoe laces loosely and requires assist from spouse occasionally.  When typing, pt engages the L hand for ~25% of the task.  Pt avoids wearing jeans d/t inability to manage button or zipper; 07/04/22: no  change yet from 04/25/22    Time 12    Period Weeks    Status New    Target Date 07/18/22        OT LONG TERM GOAL #10   Title Pt will be able to independently carry a light plate of food or drink in L hand without spilling/dropping to increase efficiency with item transport in the kitchen.   Baseline 04/25/22: Pt requires constant cues to keep cup or plate level while multitasking in order to prevent spilling; 07/04/22: Pt reports she is carrying her coffee cup from kitchen to neighboring room (not yet consistent to avoid spills)    Time 12    Period Weeks    Status New/ongoing   Target Date 07/18/22     Plan -     Clinical Impression Statement Continues to report L hand swelling, improved from prior session. Focused on edema massage to L hand and digits this date, and passive/AROM to L wrist and hand, working to decrease edema. Focused on dexterity using L hand to manage scrabble tiles, stores up to 5 tiles in palm with minimal dropping. Difficulty grasping small connecting beads. Pt will continue to work towards goals in plan of care to improve left UE functional use in ADL and IADL tasks at home and in the community.    OT Occupational Profile and History Detailed Assessment- Review of Records and additional review of physical, cognitive, psychosocial history related to current functional performance    Occupational performance deficits (Please refer to evaluation for details): ADL's;IADL's;Leisure;Rest and Sleep    Body Structure / Function / Physical Skills ADL;Coordination;Endurance;GMC;UE functional use;Balance;IADL;Pain;Dexterity;FMC;Strength;Edema;Mobility;ROM    Psychosocial Skills Environmental  Adaptations;Habits;Routines and Behaviors    Rehab Potential Good    Clinical Decision Making Several treatment options, min-mod task modification necessary   Comorbidities Affecting Occupational Performance: May have comorbidities impacting occupational performance    Modification or  Assistance to Complete Evaluation  Min-Moderate modification of tasks or assist with assess necessary to complete eval    OT Frequency 2x / week    OT Duration 12 weeks    OT Treatment/Interventions Self-care/ADL training;Cryotherapy;Paraffin;Therapeutic exercise;DME and/or AE instruction;Functional Mobility Training;Balance training;Electrical Stimulation;Ultrasound;Neuromuscular education;Manual Therapy;Splinting;Moist Heat;Contrast Bath;Passive range of motion;Therapeutic activities;Patient/family education;Coping strategies training    Plan OT recert    Consulted and Agree with Plan of Care Patient           Kathie Dike, M.S. OTR/L  07/12/22, 10:18 AM  ascom [phone]   Presley Raddle, OT 07/12/2022, 10:18 AM

## 2022-07-15 NOTE — Therapy (Signed)
OUTPATIENT PHYSICAL THERAPY NEURO TREATMENT   Patient Name: Teresa [name] MRN: [id number] DOB:05/28/1940, 82 y.o., female Today's Date: 07/16/2022   PCP: Dr. Aram Beecham REFERRING PROVIDER: Dr. Aram Beecham  END OF SESSION:  PT End of Session - 07/16/22 1040     Visit Number 12    Number of Visits 24    Date for PT Re-Evaluation 08/15/22    Authorization Type [employer] PPO-    Authorization Time Period 05/23/2022- 08/15/2022    Progress Note Due on Visit 10    PT Start Time 1101    PT Stop Time 1144    PT Time Calculation (min) 43 min    Equipment Utilized During Treatment Gait belt    Activity Tolerance Patient tolerated treatment well;No increased pain    Behavior During Therapy WFL for tasks assessed/performed                  Past Medical History:  Diagnosis Date   Cancer    skin   Hypothyroidism    Raynaud's disease    Stroke    Past Surgical History:  Procedure Laterality Date   ABDOMINAL HYSTERECTOMY  1991   APPENDECTOMY  1991   BACK SURGERY  2010   Cervical fusion C4-5-6-7   CATARACT EXTRACTION, BILATERAL Bilateral 10/2016   CHOLECYSTECTOMY  1995   COLONOSCOPY WITH PROPOFOL N/A 12/08/2018   Procedure: COLONOSCOPY WITH PROPOFOL;  Surgeon: Toledo, Boykin Nearing, MD;  Location: [facility] ENDOSCOPY;  Service: Gastroenterology;  Laterality: N/A;   EYE SURGERY     Patient Active Problem List   Diagnosis Date Noted   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   Stroke 02/27/2021   History of nonmelanoma skin cancer 05/23/2014    ONSET DATE: 04/16/2022  REFERRING DIAG: 69.354 (ICD-10-CM) - Hemiplegia and hemiparesis following cerebral infarction affecting left non-dominant side   THERAPY DIAG:  Muscle weakness (generalized)  Unsteadiness on feet  Difficulty in walking, not elsewhere classified  Other abnormalities of gait and mobility  Rationale for Evaluation and Treatment: Rehabilitation  SUBJECTIVE:                                                                                                                                                                                              SUBJECTIVE STATEMENT: Patient reports she did some walking, had some foot drag but no falls.    Pt accompanied by: significant other  PERTINENT HISTORY: Pt is a 82 y.o. female with referral for hemiplegia with left sided weakness -original CVA on 02/26/21 and new onset of left sided weakness on 04/16/2022.  R MCA CVA on  02/26/21. Pt completed outpatient PT on 01/17/2022 with majority of goals met. PMH includes: skin cancer, hypothyroidism, and Raynaud's disease, history of cervical fusion in 2010 C4-C7.   PAIN:  Are you having pain? No  PRECAUTIONS: Fall  WEIGHT BEARING RESTRICTIONS: No  FALLS: Has patient fallen in last 6 months? No  LIVING ENVIRONMENT: Lives with: lives with their spouse Lives in: House/apartment Stairs: Yes: External: 1 steps; none Has following equipment at home: Single point cane, Walker - 2 wheeled, shower chair, and Grab bars  PLOF: Independent  PATIENT GOALS: I want to walk without this cane and no falls and enjoy going out to grandkids sporting event  OBJECTIVE:   DIAGNOSTIC FINDINGS: CLINICAL DATA:  Stroke suspected   EXAM: MRI HEAD WITHOUT CONTRAST   TECHNIQUE: Multiplanar, multiecho pulse sequences of the brain and surrounding structures were obtained without intravenous contrast.   COMPARISON:  MRI Head 02/26/21, CT Head 04/16/22   FINDINGS: Brain: Evolving right MCA territory infarct involving the right frontal operculum in the right insular cortex. No acute infarction, hemorrhage, hydrocephalus, extra-axial collection or mass lesion. Small focus of microhemorrhage in the left temporal lobe. There is petechial hemorrhage and/or hemosiderosis in the region of prior right MCA territory infarct. T2/stir hyperintense signal in the right cerebral peduncle is favored to represent  transneuronal degeneration.   Vascular: Major flow voids are preserved.   Skull and upper cervical spine: Normal marrow signal.   Sinuses/Orbits: Bilateral lens replacement. No mastoid or middle ear effusion. Paranasal sinuses are clear.   Other: None.   IMPRESSION: 1. No acute intracranial abnormality. 2. 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 3.     Electronically Signed   By: Lorenza Cambridge M.D.   On: 04/16/2022 13:41  COGNITION: Overall cognitive status: Within functional limits for tasks assessed   SENSATION: WFL  COORDINATION: Intact with each LE  EDEMA:  None observed  MUSCLE TONE: LLE: Within functional limits  DTRs:  Patella 2+ = Normal and Achilles 2+ = Normal  POSTURE: rounded shoulders and forward head  LOWER EXTREMITY ROM:     Active  Right Eval Left Eval  Hip flexion WNL THROUGHOUT LE WNL THROUGHOUT LE  Hip extension    Hip abduction    Hip adduction    Hip internal rotation    Hip external rotation    Knee flexion    Knee extension    Ankle dorsiflexion    Ankle plantarflexion    Ankle inversion    Ankle eversion     (Blank rows = not tested)  LOWER EXTREMITY MMT:    MMT Right Eval Left Eval  Hip flexion 4+ 4  Hip extension 4+ 4  Hip abduction 4+ 4  Hip adduction 4+ 4  Hip internal rotation 4+ 4  Hip external rotation 4+ 4  Knee flexion 4+ 4  Knee extension 5 4  Ankle dorsiflexion 5 4  Ankle plantarflexion    Ankle inversion    Ankle eversion    (Blank rows = not tested)  BED MOBILITY:  Patient reports independent with all bed mobility and states has returned to master bed  TRANSFERS: Assistive device utilized: None  Sit to stand: Complete Independence Stand to sit: Complete Independence Chair to chair: Complete Independence Floor:  Not tested   GAIT: Gait pattern: step through pattern, decreased arm swing- Right, decreased arm swing- Left, decreased  step length- Right, and decreased step length- Left Distance walked: 915 feet  Assistive device utilized: None Level of assistance: SBA Comments: occasional scuff or decreased left foot clearance  FUNCTIONAL TESTS:  5 times sit to stand: 23.62 sec without UE support Timed up and go (TUG): 20.14 sec without an AD 10 meter walk test: 0.70 m/s  PATIENT SURVEYS:  FOTO 60/ goal of 66  TODAY'S TREATMENT:                                                                                                                              DATE: 07/16/22  Gait belt donned and CGA provided unless otherwise specified     Neuro Re-Ed: at support surface  airex pad: dual task with word game with reach and visual scan   x  8 minutes Incline static stand 30 second: x 2 trials; unable to perform without UE support Step over orange hurdle 10x each LE ; SUE support; challenging for LLE  Single limb stance 30 seconds each LE Alternate UE/LE march with cross body coordination x each LE; extremely challenging for patient to perform.    Therex:   2lb ankle weights: ambulate 500 ft with cue for foot clearance ; increased lean to L side;  STS  without UE support x10,2nd set with airex pad under RLE for weight shift onto LLE Heel toe raise 15x standing.   Seated: Dynadisc: df/pf 10x, eversion inversion 10x, clockwise circle 10x, counterclockwise 10x Alphabet tracing each LE;   PATIENT EDUCATION: Education details: Pt educated throughout session about proper posture and technique with exercises. Improved exercise technique, movement at target joints, use of target muscles after min to mod verbal, visual, tactile cues.  Person educated: Patient Education method: Explanation, Demonstration, Tactile cues, and Verbal cues Education comprehension: verbalized understanding, returned demonstration, verbal cues required, tactile cues required, and needs further education  HOME EXERCISE PROGRAM: To be updated next  1-2 visit  GOALS: Goals reviewed with patient? Yes  SHORT TERM GOALS: Target date: 07/04/2022  Patient will be independent in home exercise program to improve strength/mobility for better functional independence with ADLs  Baseline: EVAL: Patient reports mostly just walking; 07/09/22: Pt reports her exercises are going well, to be updated future date Goal status: IN PROGRESS  LONG TERM GOALS: Target date: 08/14/2021  Pt will decrease 5TSTS by at least 6 seconds in order to demonstrate clinically significant improvement in LE strength. Baseline: EVAL= 23.62 sec without UE; 07/09/22:  13 seconds hands-free  Goal status: MET  2. Pt will improve FOTO to target score of 66 to display perceived improvements in ability to complete ADL's.  Baseline: Eval= 60; 07/09/22: 65 Goal status: PARTIALLY MET  3.  Pt will decrease TUG to below 14 seconds/decrease in order to demonstrate decreased fall risk. Baseline: Eval= 20.14 sec without AD; 07/09/22: 11 seconds no AD Goal status: MET  4.  Patient will increase six minute walk test distance to >1200 ft for improve gait ability and return to PLOF to complete her  walks in her neighborhood.  Baseline: EVAL= 915 feet without an AD; 07/09/22: 1184 ft without AD Goal status: Partially MET  5. Pt will increase by at least 0.13 m/s in order to demonstrate clinically significant improvement in community ambulation.    Baseline: Eval = 0.70 m/s; 07/09/2022: 1.04 m/s without AD   Goal status: MET   6. Pt will report that she has not hit LLE/LUE on doorframe/walls/furniture while ambulating in the past two weeks to indicate improved ability to scan environment and to decrease injury risk with gait.  Baseline: currently has hit furniture/doorframe 2x/week  Goal status: NEW   ASSESSMENT:  CLINICAL IMPRESSION: Patient is challenged with cross body coordination of UE/LE raises as well as incline static hold without UE support demonstrating area of continued  focus in future sessions. She is highly motivated throughout session and is eager to progress her functional mobility. Occasional drop drop noted with prolonged ambulation requiring cueing for heel strike.  The pt will benefit from skilled PT services to improve her overall left LE muscle strength and improve her mobility to decrease her fall risk and improve her overall quality of life.   OBJECTIVE IMPAIRMENTS: Abnormal gait, decreased activity tolerance, decreased balance, decreased coordination, decreased endurance, decreased mobility, difficulty walking, and decreased strength.   ACTIVITY LIMITATIONS: carrying, lifting, bending, standing, squatting, stairs, and transfers  PARTICIPATION LIMITATIONS: cleaning, laundry, driving, shopping, community activity, and yard work  PERSONAL FACTORS: 3+ comorbidities: OA, HTN, hx of previous CVA  are also affecting patient's functional outcome.   REHAB POTENTIAL: Good  CLINICAL DECISION MAKING: Evolving/moderate complexity  EVALUATION COMPLEXITY: Moderate  PLAN:  PT FREQUENCY: 1-2x/week  PT DURATION: 12 weeks  PLANNED INTERVENTIONS: Therapeutic exercises, Therapeutic activity, Neuromuscular re-education, Balance training, Gait training, Patient/Family education, Self Care, Joint mobilization, Joint manipulation, Stair training, Vestibular training, Canalith repositioning, DME instructions, Dry Needling, Electrical stimulation, Spinal manipulation, Spinal mobilization, Cryotherapy, Moist heat, Taping, and Manual therapy  PLAN FOR NEXT SESSION: Progressive balance training and LE strengthening and add to HEP as appropriate next session.   Louis Meckel, PT Physical Therapist- [facility]  07/16/2022, 12:18 PM

## 2022-07-16 ENCOUNTER — Ambulatory Visit: Payer: Medicare PPO

## 2022-07-16 DIAGNOSIS — M6281 Muscle weakness (generalized): Secondary | ICD-10-CM

## 2022-07-16 DIAGNOSIS — R2689 Other abnormalities of gait and mobility: Secondary | ICD-10-CM

## 2022-07-16 DIAGNOSIS — R262 Difficulty in walking, not elsewhere classified: Secondary | ICD-10-CM

## 2022-07-16 DIAGNOSIS — R2681 Unsteadiness on feet: Secondary | ICD-10-CM

## 2022-07-16 DIAGNOSIS — I63511 Cerebral infarction due to unspecified occlusion or stenosis of right middle cerebral artery: Secondary | ICD-10-CM

## 2022-07-16 DIAGNOSIS — R278 Other lack of coordination: Secondary | ICD-10-CM

## 2022-07-16 NOTE — Therapy (Signed)
OUTPATIENT OCCUPATIONAL THERAPY NEURO TREATMENT NOTE          Patient Name: Teresa [name] MRN: [id number] DOB:19-Apr-1940, 82 y.o., female Today's Date: 05/23/2022  PCP: Dr. Aram Beecham REFERRING PROVIDER: Dr. Aram Beecham   OT End of Session - 07/16/22 1044     Visit Number 113    Number of Visits 117    Date for OT Re-Evaluation 07/18/22    Authorization Time Period Reporting period beginning 07/04/22    Progress Note Due on Visit 10    OT Start Time 1015    OT Stop Time 1100    OT Time Calculation (min) 45 min    Equipment Utilized During Treatment cane    Activity Tolerance Patient tolerated treatment well    Behavior During Therapy WFL for tasks assessed/performed            Past Medical History:  Diagnosis Date   Cancer    skin   Hypothyroidism    Raynaud's disease    Stroke    Past Surgical History:  Procedure Laterality Date   ABDOMINAL HYSTERECTOMY  1991   APPENDECTOMY  1991   BACK SURGERY  2010   Cervical fusion C4-5-6-7   CATARACT EXTRACTION, BILATERAL Bilateral 10/2016   CHOLECYSTECTOMY  1995   COLONOSCOPY WITH PROPOFOL N/A 12/08/2018   Procedure: COLONOSCOPY WITH PROPOFOL;  Surgeon: Toledo, Boykin Nearing, MD;  Location: [facility] ENDOSCOPY;  Service: Gastroenterology;  Laterality: N/A;   EYE SURGERY     Patient Active Problem List   Diagnosis Date Noted   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  Following inpt rehab, pt went home and had  home health.   PRECAUTIONS: fall  SUBJECTIVE: Pt reports she did some walking over the weekend but wore her compression glove on the L hand, so her hand did not swell.  PAIN:  Are you having pain? none  OBJECTIVE:  L grip 6, R grip 41 L lateral pinch 5, R 13 L 3 point pinch 4, R 14  L 9 hole 1 min 43 sec. L shoulder active flexion 0-105, passive 0-125 02/05/22: L grip 13#  L lateral pinch 8#  3 point pinch 5#  L shoulder flex 0-108 03/12/22:  L grip: 12# L lateral pinch: 8# 3 point pinch: 6# L 9 hole: 1 min 3 sec L shoulder flexion 0-110 04/16/22: L grip: 3# L lateral pinch: 4# 3 point pinch: difficulty maintaining 3 point pinch  L 9 hole: 5 pegs in 2 min 36 sec  L shoulder flexion 0-107 BP L 132/79 HR 80 02 sats 96% 04/25/22: L grip: 14#; R grip: 50# L lateral 2#, R lateral 17# 3 point pinch: L 4#, R 14# (Saehan pinch gauge) 9 hole peg test: 1 min, 48 sec L shoulder flex 125 (R shoulder 132 active) 05/21/22: L grip: 15#; R grip: 50# L lateral 8#, R lateral 20# 3 point pinch: L 4#, R 12# (Saehan pinch gauge) 9 hole peg test: 1 min, 23  sec 07/04/22: L grip: 19# L lateral 10# 3 point pinch: L 6# (standard pinch gauge-not Saehan) 9 hole peg test: 1 min, 14 sec  TODAY'S TREATMENT: Therapeutic Exercise: Facilitated hand strengthening with use of hand gripper set at 11.2# to remove jumbo pegs from pegboard x2 trials using L hand.  Pt required 2-3 rest breaks to complete each trial, and intermittent min A to reposition hand gripper in L palm to reduce dropping, and intermittent cues to ensure L SF was engaged into each gripping rep.  Facilitated pinch strengthening with use of therapy resistant clothespins to target lateral and 3 point pinch of L hand.  Pt able to pinch all colors, though black inconsistently, typically requiring extra trials for successful pinch.  Neuro re-ed: Facilitated L hand FMC/dexterity skills working to pick up small pegs from table top and place  into pegboard.  Pt tried first without non-skid surface and was able to successfully pick up and place one peg this way, but then required wash cloth to prevent pegs from rolling on table as pt reported a lot of hand fatigue following hand strengthening activities, which made peg pick up and placement more challenging.     PATIENT EDUCATION: Education details: Encouraged pt wear L compression glove for long walks in the heat Person educated: pt Education method: explanation Education comprehension: verbalized understanding  HOME EXERCISE PROGRAM Continue to engage LUE into ADLs; continue use of putty for gripping and pinching exercises for LUE, and L shoulder AROM/AAROM, crocheting, typing; increase participation in IADL tasks for greater use of L hand.    OT Long Term Goals - 06/06/22 (6 weeks)      OT LONG TERM GOAL #1   Title Pt will be independent with home exercise program.    Baseline Eval: no current program, 10th visit:  continue to add new exercises as pt progresses, 20th:  continue to update HEP; 08/17/21: continue to progress HEP when indicated.  Visit 40: adding new exercises as pt progresses; 12/25/21: ongoing with progressions; 03/12/22: inconsistent use of putty but regular use of stress ball (every other day).  Encouraged pt return to daily putty use (2x daily when able) to target grip and pinch strengthening; 04/16/22: not reviewed today d/t OT sent pt to ED; 04/25/22: added pulleys and pt has re-started putty; 05/21/22: stopped pulleys and added stronger theraputty and encouraged pt crochet often; 07/04/22: pt working consistently with putty and grip strengthener at 20# at home    Time 12    Period Weeks    Status On-going    Target Date 06/06/22     OT LONG TERM GOAL #2   Title Pt will complete UB and LB dressing with modified independence including buttons, snaps and zippers.    Baseline requires min assist at eval, 10th visit: occasional assist with buttons, 20th:  able to  perform one handed, but difficulty with bilateral UE; 08/17/21: pt reports inconsistent with 1 hand, reviewed techniques this visit, Visit 40:  Pt requires assist with bra   Time 12    Period Weeks    Status MET   Target Date 11/08/21     OT LONG TERM GOAL #3   Title Pt will perform shower transfer with modified independence.    Baseline Pt requires supervision to min assist for shower transfer at home. 10th visit: supervision; 08/17/21: supv .  Visit 40:  Pt had met goal but had recent fall and now requiring min guard to supv   Time 6  Period Weeks    Status  Met    Target Date 11/08/21      OT LONG TERM GOAL #4   Title Pt will improve L hand grip to 20 or more #s to enable pt to open a new jar.   Baseline no grip in left hand at eval, 10th visit:  improved flexion but still working towards composite fisting and grip. 20th:  continues to demo decreased grip; 08/17/21: active digit flexion improving, but not yet able to register grip on dynamometer; 09/04/21: L grip 1# 7/17:  5#; 12/25/21: L grip 6#; 02/05/22: 13#; 03/12/22: L grip 12#; 1/23//24: L grip 3# (decreased); 04/25/22: L grip 14#; spouse assists to open new jars; 05/21/22: L grip 15#; 07/04/22: L grip 19#    Time 12    Period Weeks    Status Revised/ongoing   Target Date 07/18/22     OT LONG TERM GOAL #5   Title Pt will improve left shoulder flexion to 100 degrees or better to improve reaching to obtain self care items from shelf/shoulder height.    Baseline difficulty with reach, shoulder flexion to 47 degrees; 08/17/21: L shoulder flexion 85, but not yet able to consistently hold ADL supplies in L hand when reaching; 09/04/21: flexion 85 7/17:  shoulder flexion to 90; 12/25/21: 105 (P 125); 03/12/22: active L shoulder flexion 110 (pt uses R arm to reach for items at shoulder height or above); 04/16/22: L shoulder flexion 107; 04/25/22: L shoulder flexion 125*, R 132*    Time 12    Period Weeks    Status achieved   Target Date 04/25/22      OT LONG TERM GOAL #6   Title Pt will improve FOTO score to 47 or above to demonstrate a clinically relevant change in function to impact ADL tasks.    Baseline score of 30 at eval; 08/17/21: FOTO: 42; 09/04/21: FOTO : 54 , FOTO 7/7:  60; 12/25/21: 59; 03/12/22: 66; 04/16/22: 67; 04/25/22; 51; 05/21/22: 74; 07/04/22: 74    Time 12    Period Weeks    Status  MET/ongoing    Target Date 04/25/22     OT LONG TERM GOAL #7   Title Pt will demonstrate ability to pick up small objects and complete 9 hole peg test in less than 1 min 30 sec (revised from under 2 min)   Baseline unable to perform at eval, 10th visit: still unable to pick up small pegs, 20th: unable to pick up pegs but improving; 08/17/21: not attempted d/t time constraints, will assess next visit; 09/04/21: 9 hole peg test in 5 min 53 sec;  7/17:  Pt unable to complete this date but was able to place 6 of 9 pegs in 4 mins 20 secs; 12/25/21: L 1 min 43 sec; 03/12/22: L 1 min 2 sec (requires non-skid surface to pick up small items from table top); 04/16/22: 5 pegs in 2 min 36 secs; 04/25/22: 1 min 48 sec; 05/21/22: L 1 min 23 sec; 07/04/22: 1 min 14 sec    Time 12    Period Weeks    Status Revised/On-going    Target Date 07/18/22    OT LONG TERM GOAL #8   Title Pt will increase LUE strength by 1 MM grade in order to hold blow dryer in LUE to dry hair without dropping dryer.   Baseline 04/25/22: Unable to sustain grip and maintain lift of LUE with hand near head without dropping dryer.  Jamesetta Orleans has  caused bruising above L eye with previous attempts.  L shoulder flex/abd 3/5, elbow flex/ext 3+/5; 07/04/22: L shoulder flex/abd 3+, elbow and wrist flex/ext 4+    Time 12    Period Weeks    Status New/ ongoing   Target Date 07/18/22        OT LONG TERM GOAL #9   Title Pt will increase L hand dexterity to enable pt to independently manage clothing fasteners.   Baseline 04/25/22: pt ties shoe laces loosely and requires assist from spouse occasionally.  When typing, pt  engages the L hand for ~25% of the task.  Pt avoids wearing jeans d/t inability to manage button or zipper; 07/04/22: no change yet from 04/25/22    Time 12    Period Weeks    Status New    Target Date 07/18/22        OT LONG TERM GOAL #10   Title Pt will be able to independently carry a light plate of food or drink in L hand without spilling/dropping to increase efficiency with item transport in the kitchen.   Baseline 04/25/22: Pt requires constant cues to keep cup or plate level while multitasking in order to prevent spilling; 07/04/22: Pt reports she is carrying her coffee cup from kitchen to neighboring room (not yet consistent to avoid spills)    Time 12    Period Weeks    Status New/ongoing   Target Date 07/18/22     Plan -     Clinical Impression Statement Pt tolerated L hand strengthening exercises well with frequent rest breaks.  Pt able to pinch black most resistive pins inconsistently, but typically more successful when able to rest forearm on table top to engage the hand separately from the shoulder rather than clipping pins higher on a vertical dowel.  Facilitated L hand FMC/dexterity skills working to pick up small pegs from table top and place into pegboard.  Pt tried first without non-skid surface and was able to successfully pick up and place one peg this way, but then required wash cloth to prevent pegs from rolling on table as pt reported a lot of hand fatigue following hand strengthening activities, which made peg pick up and placement more challenging.  Pt has been more consistent to don her compression glove on the L hand during the heat or longer walks.  Pt arrived with glove on today and L hand edema was well managed.  Pt will continue to work towards goals in plan of care to improve left UE functional use in ADL and IADL tasks at home and in the community.    OT Occupational Profile and History Detailed Assessment- Review of Records and additional review of physical, cognitive,  psychosocial history related to current functional performance    Occupational performance deficits (Please refer to evaluation for details): ADL's;IADL's;Leisure;Rest and Sleep    Body Structure / Function / Physical Skills ADL;Coordination;Endurance;GMC;UE functional use;Balance;IADL;Pain;Dexterity;FMC;Strength;Edema;Mobility;ROM    Psychosocial Skills Environmental  Adaptations;Habits;Routines and Behaviors    Rehab Potential Good    Clinical Decision Making Several treatment options, min-mod task modification necessary   Comorbidities Affecting Occupational Performance: May have comorbidities impacting occupational performance    Modification or Assistance to Complete Evaluation  Min-Moderate modification of tasks or assist with assess necessary to complete eval    OT Frequency 2x / week    OT Duration 12 weeks    OT Treatment/Interventions Self-care/ADL training;Cryotherapy;Paraffin;Therapeutic exercise;DME and/or AE instruction;Functional Mobility Training;Balance training;Electrical Stimulation;Ultrasound;Neuromuscular education;Manual Therapy;Splinting;Moist Heat;Contrast Bath;Passive range  of motion;Therapeutic activities;Patient/family education;Coping strategies training    Plan OT recert    Consulted and Agree with Plan of Care Patient           Danelle Earthly, MS, OTR/L  [employer] Dials, OT 07/16/2022, 10:47 AM

## 2022-07-18 ENCOUNTER — Ambulatory Visit: Payer: Medicare PPO

## 2022-07-18 DIAGNOSIS — R2681 Unsteadiness on feet: Secondary | ICD-10-CM

## 2022-07-18 DIAGNOSIS — R2689 Other abnormalities of gait and mobility: Secondary | ICD-10-CM

## 2022-07-18 DIAGNOSIS — M6281 Muscle weakness (generalized): Secondary | ICD-10-CM

## 2022-07-18 DIAGNOSIS — R262 Difficulty in walking, not elsewhere classified: Secondary | ICD-10-CM

## 2022-07-18 DIAGNOSIS — I63511 Cerebral infarction due to unspecified occlusion or stenosis of right middle cerebral artery: Secondary | ICD-10-CM

## 2022-07-18 DIAGNOSIS — R278 Other lack of coordination: Secondary | ICD-10-CM

## 2022-07-18 NOTE — Therapy (Signed)
OUTPATIENT PHYSICAL THERAPY NEURO TREATMENT   Patient Name: Teresa [name] MRN: [id number] DOB:Oct 17, 1940, 82 y.o., female Today's Date: 07/18/2022   PCP: Dr. Aram Beecham REFERRING PROVIDER: Dr. Aram Beecham  END OF SESSION:  PT End of Session - 07/18/22 1258     Visit Number 13    Number of Visits 24    Date for PT Re-Evaluation 08/15/22    Authorization Type [employer] PPO-    Authorization Time Period 05/23/2022- 08/15/2022    Progress Note Due on Visit 10    PT Start Time 1300    PT Stop Time 1344    PT Time Calculation (min) 44 min    Equipment Utilized During Treatment Gait belt    Activity Tolerance Patient tolerated treatment well;No increased pain    Behavior During Therapy WFL for tasks assessed/performed                   Past Medical History:  Diagnosis Date   Cancer    skin   Hypothyroidism    Raynaud's disease    Stroke    Past Surgical History:  Procedure Laterality Date   ABDOMINAL HYSTERECTOMY  1991   APPENDECTOMY  1991   BACK SURGERY  2010   Cervical fusion C4-5-6-7   CATARACT EXTRACTION, BILATERAL Bilateral 10/2016   CHOLECYSTECTOMY  1995   COLONOSCOPY WITH PROPOFOL N/A 12/08/2018   Procedure: COLONOSCOPY WITH PROPOFOL;  Surgeon: Toledo, Boykin Nearing, MD;  Location: [facility] ENDOSCOPY;  Service: Gastroenterology;  Laterality: N/A;   EYE SURGERY     Patient Active Problem List   Diagnosis Date Noted   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   Stroke 02/27/2021   History of nonmelanoma skin cancer 05/23/2014    ONSET DATE: 04/16/2022  REFERRING DIAG: 69.354 (ICD-10-CM) - Hemiplegia and hemiparesis following cerebral infarction affecting left non-dominant side   THERAPY DIAG:  Muscle weakness (generalized)  Unsteadiness on feet  Difficulty in walking, not elsewhere classified  Other abnormalities of gait and mobility  Rationale for Evaluation and Treatment: Rehabilitation  SUBJECTIVE:                                                                                                                                                                                              SUBJECTIVE STATEMENT: Patient reports she went to a soccer game yesterday. No falls or LOB since last session.    Pt accompanied by: significant other  PERTINENT HISTORY: Pt is a 82 y.o. female with referral for hemiplegia with left sided weakness -original CVA on 02/26/21 and new onset of left sided weakness on 04/16/2022.  R MCA CVA on 02/26/21. Pt completed outpatient PT on 01/17/2022 with majority of goals met. PMH includes: skin cancer, hypothyroidism, and Raynaud's disease, history of cervical fusion in 2010 C4-C7.   PAIN:  Are you having pain? No  PRECAUTIONS: Fall  WEIGHT BEARING RESTRICTIONS: No  FALLS: Has patient fallen in last 6 months? No  LIVING ENVIRONMENT: Lives with: lives with their spouse Lives in: House/apartment Stairs: Yes: External: 1 steps; none Has following equipment at home: Single point cane, Walker - 2 wheeled, shower chair, and Grab bars  PLOF: Independent  PATIENT GOALS: I want to walk without this cane and no falls and enjoy going out to grandkids sporting event  OBJECTIVE:   DIAGNOSTIC FINDINGS: CLINICAL DATA:  Stroke suspected   EXAM: MRI HEAD WITHOUT CONTRAST   TECHNIQUE: Multiplanar, multiecho pulse sequences of the brain and surrounding structures were obtained without intravenous contrast.   COMPARISON:  MRI Head 02/26/21, CT Head 04/16/22   FINDINGS: Brain: Evolving right MCA territory infarct involving the right frontal operculum in the right insular cortex. No acute infarction, hemorrhage, hydrocephalus, extra-axial collection or mass lesion. Small focus of microhemorrhage in the left temporal lobe. There is petechial hemorrhage and/or hemosiderosis in the region of prior right MCA territory infarct. T2/stir hyperintense signal in the right cerebral peduncle is favored to  represent transneuronal degeneration.   Vascular: Major flow voids are preserved.   Skull and upper cervical spine: Normal marrow signal.   Sinuses/Orbits: Bilateral lens replacement. No mastoid or middle ear effusion. Paranasal sinuses are clear.   Other: None.   IMPRESSION: 1. No acute intracranial abnormality. 2. 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 3.     Electronically Signed   By: Marin Roberts M.D.   On: 04/16/2022 13:41  COGNITION: Overall cognitive status: Within functional limits for tasks assessed   SENSATION: WFL  COORDINATION: Intact with each LE  EDEMA:  None observed  MUSCLE TONE: LLE: Within functional limits  DTRs:  Patella 2+ = Normal and Achilles 2+ = Normal  POSTURE: rounded shoulders and forward head  LOWER EXTREMITY ROM:     Active  Right Eval Left Eval  Hip flexion WNL THROUGHOUT LE WNL THROUGHOUT LE  Hip extension    Hip abduction    Hip adduction    Hip internal rotation    Hip external rotation    Knee flexion    Knee extension    Ankle dorsiflexion    Ankle plantarflexion    Ankle inversion    Ankle eversion     (Blank rows = not tested)  LOWER EXTREMITY MMT:    MMT Right Eval Left Eval  Hip flexion 4+ 4  Hip extension 4+ 4  Hip abduction 4+ 4  Hip adduction 4+ 4  Hip internal rotation 4+ 4  Hip external rotation 4+ 4  Knee flexion 4+ 4  Knee extension 5 4  Ankle dorsiflexion 5 4  Ankle plantarflexion    Ankle inversion    Ankle eversion    (Blank rows = not tested)  BED MOBILITY:  Patient reports independent with all bed mobility and states has returned to master bed  TRANSFERS: Assistive device utilized: None  Sit to stand: Complete Independence Stand to sit: Complete Independence Chair to chair: Complete Independence Floor:  Not tested   GAIT: Gait pattern: step through pattern, decreased arm swing- Right, decreased arm swing- Left,  decreased step length- Right, and decreased step length- Left  Distance walked: 915 feet Assistive device utilized: None Level of assistance: SBA Comments: occasional scuff or decreased left foot clearance  FUNCTIONAL TESTS:  5 times sit to stand: 23.62 sec without UE support Timed up and go (TUG): 20.14 sec without an AD 10 meter walk test: 0.70 m/s  PATIENT SURVEYS:  FOTO 60/ goal of 66  TODAY'S TREATMENT:                                                                                                                              DATE: 07/18/22  Gait belt donned and CGA provided unless otherwise specified     Neuro Re-Ed: at support surface  Step over orange hurdle 15x clap hands with each LE ; SUE support; challenging for LLE  Single limb stance 30 seconds each LE Alternate UE/LE march with cross body coordination x each LE; extremely challenging for patient to perform.  Half foam roller df/pf 20x  Half foam roller round side up x2 minutes no UE support    Activity Description: red=right hand/foot; green=left hand/foot;  with 3 on floor and 3 on table Activity Setting:  The Blaze Pod Random setting was chosen to enhance cognitive processing and agility, providing an unpredictable environment to simulate real-world scenarios, and fostering quick reactions and adaptability.   Number of Pods:  6 Cycles/Sets:  3 Duration (Time or Hit Count):  15   Therex:   Nustep Lvl 4; 4 minutes Single leg heel raise 10x RTB around feet: lateral step 4x length of // bars  RTB march with df  and band around toes. 15x  Bosu ball round side up:  -modified forward lunge 10x each LE -modified lateral lunge 10x each LE   PATIENT EDUCATION: Education details: Pt educated throughout session about proper posture and technique with exercises. Improved exercise technique, movement at target joints, use of target muscles after min to mod verbal, visual, tactile cues.  Person educated:  Patient Education method: Explanation, Demonstration, Tactile cues, and Verbal cues Education comprehension: verbalized understanding, returned demonstration, verbal cues required, tactile cues required, and needs further education  HOME EXERCISE PROGRAM: To be updated next 1-2 visit  GOALS: Goals reviewed with patient? Yes  SHORT TERM GOALS: Target date: 07/04/2022  Patient will be independent in home exercise program to improve strength/mobility for better functional independence with ADLs  Baseline: EVAL: Patient reports mostly just walking; 07/09/22: Pt reports her exercises are going well, to be updated future date Goal status: IN PROGRESS  LONG TERM GOALS: Target date: 08/14/2021  Pt will decrease 5TSTS by at least 6 seconds in order to demonstrate clinically significant improvement in LE strength. Baseline: EVAL= 23.62 sec without UE; 07/09/22:  13 seconds hands-free  Goal status: MET  2. Pt will improve FOTO to target score of 66 to display perceived improvements in ability to complete ADL's.  Baseline: Eval= 60; 07/09/22: 65 Goal status: PARTIALLY MET  3.  Pt will decrease TUG to  below 14 seconds/decrease in order to demonstrate decreased fall risk. Baseline: Eval= 20.14 sec without AD; 07/09/22: 11 seconds no AD Goal status: MET  4.  Patient will increase six minute walk test distance to >1200 ft for improve gait ability and return to PLOF to complete her walks in her neighborhood.  Baseline: EVAL= 915 feet without an AD; 07/09/22: 1184 ft without AD Goal status: Partially MET  5. Pt will increase by at least 0.13 m/s in order to demonstrate clinically significant improvement in community ambulation.    Baseline: Eval = 0.70 m/s; 07/09/2022: 1.04 m/s without AD   Goal status: MET   6. Pt will report that she has not hit LLE/LUE on doorframe/walls/furniture while ambulating in the past two weeks to indicate improved ability to scan environment and to decrease injury risk  with gait.  Baseline: currently has hit furniture/doorframe 2x/week  Goal status: NEW   ASSESSMENT:  CLINICAL IMPRESSION: Patient tolerates progressive stabilization and mobility. Patient is highly motivated throughout session. Coordination with use of blaze pods tolerated well. The patient demonstrated significant progress while utilizing [employer], showcasing improved coordination, balance, and cognitive function. The incorporation of dual-tasking technology with color recognition and association with specific movements in Blaze Pods was strategically chosen to provide a dynamic training environment, enabling the patient to engage in simultaneous physical and cognitive tasks. This unique approach enhances not only their physical abilities but also fosters increased neural connectivity and mental awareness, contributing to a well-rounded and effective rehabilitation and training experience.  .  The pt will benefit from skilled PT services to improve her overall left LE muscle strength and improve her mobility to decrease her fall risk and improve her overall quality of life.   OBJECTIVE IMPAIRMENTS: Abnormal gait, decreased activity tolerance, decreased balance, decreased coordination, decreased endurance, decreased mobility, difficulty walking, and decreased strength.   ACTIVITY LIMITATIONS: carrying, lifting, bending, standing, squatting, stairs, and transfers  PARTICIPATION LIMITATIONS: cleaning, laundry, driving, shopping, community activity, and yard work  PERSONAL FACTORS: 3+ comorbidities: OA, HTN, hx of previous CVA  are also affecting patient's functional outcome.   REHAB POTENTIAL: Good  CLINICAL DECISION MAKING: Evolving/moderate complexity  EVALUATION COMPLEXITY: Moderate  PLAN:  PT FREQUENCY: 1-2x/week  PT DURATION: 12 weeks  PLANNED INTERVENTIONS: Therapeutic exercises, Therapeutic activity, Neuromuscular re-education, Balance training, Gait training, Patient/Family  education, Self Care, Joint mobilization, Joint manipulation, Stair training, Vestibular training, Canalith repositioning, DME instructions, Dry Needling, Electrical stimulation, Spinal manipulation, Spinal mobilization, Cryotherapy, Moist heat, Taping, and Manual therapy  PLAN FOR NEXT SESSION: Progressive balance training and LE strengthening and add to HEP as appropriate next session.   Precious Bard PT  Physical Therapist- [place]  [facility]  07/18/2022, 1:47 PM

## 2022-07-18 NOTE — Therapy (Signed)
OUTPATIENT OCCUPATIONAL THERAPY NEURO RECERTIFICATION NOTE          Patient Name: Teresa [name] MRN: [id number] DOB:[birth year], 82 y.o., female Today's Date: 05/23/2022  PCP: Dr. Aram Beecham REFERRING PROVIDER: Dr. Aram Beecham   OT End of Session - 07/19/22 1038     Visit Number 114    Number of Visits 138    Date for OT Re-Evaluation 10/10/22    Authorization Time Period Reporting period beginning 07/04/22    Progress Note Due on Visit 10    OT Start Time 1345    OT Stop Time 1430    OT Time Calculation (min) 45 min    Equipment Utilized During Treatment cane    Activity Tolerance Patient tolerated treatment well    Behavior During Therapy WFL for tasks assessed/performed            Past Medical History:  Diagnosis Date   Cancer (HCC)    skin   Hypothyroidism    Raynaud's disease    Stroke [facility])    Past Surgical History:  Procedure Laterality Date   ABDOMINAL HYSTERECTOMY  1991   APPENDECTOMY  1991   BACK SURGERY  2010   Cervical fusion C4-5-6-7   CATARACT EXTRACTION, BILATERAL Bilateral 10/2016   CHOLECYSTECTOMY  1995   COLONOSCOPY WITH PROPOFOL N/A 12/08/2018   Procedure: COLONOSCOPY WITH PROPOFOL;  Surgeon: Toledo, Boykin Nearing, MD;  Location: [facility] ENDOSCOPY;  Service: Gastroenterology;  Laterality: N/A;   EYE SURGERY     Patient Active Problem List   Diagnosis Date Noted   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   Following inpt rehab, pt went home and had home health.   PRECAUTIONS: fall  SUBJECTIVE: Pt reports she's been more consistent to wear her compression glove in the R hand, specifically when it's warm or when going on longer walks, as recommended by OT.    PAIN:  Are you having pain? none  OBJECTIVE:  L grip 6, R grip 41 L lateral pinch 5, R 13 L 3 point pinch 4, R 14  L 9 hole 1 min 43 sec. L shoulder active flexion 0-105, passive 0-125 02/05/22: L grip 13#  L lateral pinch 8#  3 point pinch 5#  L shoulder flex 0-108 03/12/22:  L grip: 12# L lateral pinch: 8# 3 point pinch: 6# L 9 hole: 1 min 3 sec L shoulder flexion 0-110 04/16/22: L grip: 3# L lateral pinch: 4# 3 point pinch: difficulty maintaining 3 point pinch  L 9 hole: 5 pegs in 2 min 36 sec  L shoulder flexion 0-107 BP L 132/79 HR 80 02 sats 96% 04/25/22: L grip: 14#; R grip: 50# L lateral 2#, R lateral 17# 3 point pinch: L 4#, R 14# (Saehan pinch gauge) 9 hole peg test: 1 min, 48 sec L shoulder flex 125 (R shoulder 132 active) 05/21/22: L grip: 15#; R grip: 50# L lateral 8#, R lateral 20# 3 point pinch: L 4#,  R 12# (Saehan pinch gauge) 9 hole peg test: 1 min, 23 sec 07/04/22: L grip: 19# L lateral 10# 3 point pinch: L 6# (standard pinch gauge-not Saehan) 9 hole peg test: 1 min, 14 sec 07/18/22:  L grip 22# L lateral: 9# L 3 point pinch: 6# L 9 hole: 1 min 8 sec   TODAY'S TREATMENT: Therapeutic Exercise: Objective measures taken and goals updated for recertification.  Facilitated hand strengthening with use of hand gripper set at 20# to complete 4 sets 10 reps of grip squeezes, min vc to ensure SF was engaged with each gripping rep.  Facilitated pinch strengthening with use of therapy resistant clothespins to target 3 point pinch of L hand.  Pt practiced only with the yellow, red, and green pins today as focus was on better stability of the L LF while maintaining PIP flexion while opposing LF and IF to  thumb on clips.  Pt required intermittent min A to formulate this prehension, but improved with repetition.    PATIENT EDUCATION: Education details: progress towards goals Person educated: pt Education method: explanation Education comprehension: verbalized understanding  HOME EXERCISE PROGRAM Continue to engage LUE into ADLs; continue use of putty for gripping and pinching exercises for LUE, and L shoulder AROM/AAROM, crocheting, typing; increase participation in IADL tasks for greater use of L hand.    OT Short Term Goals - 08/29/22 (6 weeks)      OT SHORT TERM GOAL #1   Title Pt will be independent with home exercise program.    Baseline Eval: no current program, 10th visit:  continue to add new exercises as pt progresses, 20th:  continue to update HEP; 08/17/21: continue to progress HEP when indicated.  Visit 40: adding new exercises as pt progresses; 12/25/21: ongoing with progressions; 03/12/22: inconsistent use of putty but regular use of stress ball (every other day).  Encouraged pt return to daily putty use (2x daily when able) to target grip and pinch strengthening; 04/16/22: not reviewed today d/t OT sent pt to ED; 04/25/22: added pulleys and pt has re-started putty; 05/21/22: stopped pulleys and added stronger theraputty and encouraged pt crochet often; 07/04/22: pt working consistently with putty and grip strengthener at 20# at home; 07/18/22: pt continues to work on putty and Building surveyor and will be starting with her crocheting again to progress [facility] skills   Time 12    Period Weeks    Status On-going    Target Date 08/29/22     OT LONG TERM GOAL #2   Title Pt will complete UB and LB dressing with modified independence including buttons, snaps and zippers.    Baseline requires min assist at eval, 10th visit: occasional assist with buttons, 20th:  able to perform one handed, but difficulty with bilateral UE; 08/17/21: pt reports inconsistent with 1 hand, reviewed techniques this visit,  Visit 40:  Pt requires assist with bra   Time 12    Period Weeks    Status MET   Target Date 11/08/21     OT LONG TERM GOAL #3   Title Pt will perform shower transfer with modified independence.    Baseline Pt requires supervision to min assist for shower transfer at home. 10th visit: supervision; 08/17/21: supv .  Visit 40:  Pt had met goal but had recent fall and now requiring min guard to supv   Time 6    Period Weeks    Status  Met    Target Date 11/08/21  OT LONG TERM GOAL #4 10/10/22 (12 weeks)    Title Pt will improve L hand grip to 25 or more #s to enable pt to open a new jar.   Baseline no grip in left hand at eval, 10th visit:  improved flexion but still working towards composite fisting and grip. 20th:  continues to demo decreased grip; 08/17/21: active digit flexion improving, but not yet able to register grip on dynamometer; 09/04/21: L grip 1# 7/17:  5#; 12/25/21: L grip 6#; 02/05/22: 13#; 03/12/22: L grip 12#; 1/23//24: L grip 3# (decreased); 04/25/22: L grip 14#; spouse assists to open new jars; 05/21/22: L grip 15#; 07/04/22: L grip 19#; 07/18/22: L grip 22#, difficulty opening a new jar (R grip 50#)   Time 12    Period Weeks    Status Revised/ongoing   Target Date 10/10/22     OT LONG TERM GOAL #5   Title Pt will improve left shoulder flexion to 100 degrees or better to improve reaching to obtain self care items from shelf/shoulder height.    Baseline difficulty with reach, shoulder flexion to 47 degrees; 08/17/21: L shoulder flexion 85, but not yet able to consistently hold ADL supplies in L hand when reaching; 09/04/21: flexion 85 7/17:  shoulder flexion to 90; 12/25/21: 105 (P 125); 03/12/22: active L shoulder flexion 110 (pt uses R arm to reach for items at shoulder height or above); 04/16/22: L shoulder flexion 107; 04/25/22: L shoulder flexion 125*, R 132*    Time 12    Period Weeks    Status achieved   Target Date 04/25/22     OT LONG TERM GOAL #6   Title Pt will improve FOTO  score to 47 or above to demonstrate a clinically relevant change in function to impact ADL tasks.    Baseline score of 30 at eval; 08/17/21: FOTO: 42; 09/04/21: FOTO : 54 , FOTO 7/7:  60; 12/25/21: 59; 03/12/22: 66; 04/16/22: 67; 04/25/22; 51; 05/21/22: 74; 07/04/22: 74; 07/18/22: 71   Time 12    Period Weeks    Status  MET/ongoing    Target Date 10/10/22     OT LONG TERM GOAL #7   Title Pt will demonstrate ability to pick up small objects with L hand and complete 9 hole peg test in less than 1 min (revised)   Baseline unable to perform at eval, 10th visit: still unable to pick up small pegs, 20th: unable to pick up pegs but improving; 08/17/21: not attempted d/t time constraints, will assess next visit; 09/04/21: 9 hole peg test in 5 min 53 sec;  7/17:  Pt unable to complete this date but was able to place 6 of 9 pegs in 4 mins 20 secs; 12/25/21: L 1 min 43 sec; 03/12/22: L 1 min 2 sec (requires non-skid surface to pick up small items from table top); 04/16/22: 5 pegs in 2 min 36 secs; 04/25/22: 1 min 48 sec; 05/21/22: L 1 min 23 sec; 07/04/22: 1 min 14 sec    Time 12    Period Weeks    Status Revised/On-going    Target Date 10/10/22    OT LONG TERM GOAL #8   Title Pt will increase LUE strength by 1 MM grade in order to hold blow dryer in LUE to dry hair without dropping dryer.   Baseline 04/25/22: Unable to sustain grip and maintain lift of LUE with hand near head without dropping dryer.  Dryer has caused bruising above L eye with  previous attempts.  L shoulder flex/abd 3/5, elbow flex/ext 3+/5; 07/04/22: L shoulder flex/abd 3+, elbow and wrist flex/ext 4+    Time 12    Period Weeks    Status New/ ongoing   Target Date 10/10/22        OT LONG TERM GOAL #9   Title Pt will increase L hand dexterity to enable pt to independently manage clothing fasteners.   Baseline 04/25/22: pt ties shoe laces loosely and requires assist from spouse occasionally.  When typing, pt engages the L hand for ~25% of the task.  Pt avoids  wearing jeans d/t inability to manage button or zipper; 07/04/22: no change yet from 04/25/22; 07/18/22: Managed zipper today, but increased time and effort to manage buttons on pants; able to tie shoes loosely with increased time   Time 12    Period Weeks    Status New    Target Date 10/10/22        OT LONG TERM GOAL #10   Title Pt will be able to independently carry a light plate of food or drink in L hand without spilling/dropping to increase efficiency with item transport in the kitchen.   Baseline 04/25/22: Pt requires constant cues to keep cup or plate level while multitasking in order to prevent spilling; 07/04/22: Pt reports she is carrying her coffee cup from kitchen to neighboring room (not yet consistent to avoid spills)    Time 12    Period Weeks    Status New/ongoing   Target Date 10/10/22     Plan -     Clinical Impression Statement Pt seen for recertification assessment this date.  L grip strength and 9 hole peg test continue to show steady improvements.  Pt continues to require frequent rest breaks when engaging the L hand into grasping and [employer] skills and responds well to heat and stretching for promoting dexterity.  Pt continues to be challenged by picking up small objects from table top without a non skid surface, storing items in hand,  using translatory skills to move items from palm to fingertips, and gripping jars or containers tightly to open a new lid.  Pt often drops items from the L hand when attention is not focussed on the L hand during item transport, noting weakness and decreased proprioceptive awareness throughout the LUE.  Pt will continue to work towards goals in plan of care to improve left UE functional use in ADL and IADL tasks at home and in the community.    OT Occupational Profile and History Detailed Assessment- Review of Records and additional review of physical, cognitive, psychosocial history related to current functional performance    Occupational performance  deficits (Please refer to evaluation for details): ADL's;IADL's;Leisure;Rest and Sleep    Body Structure / Function / Physical Skills ADL;Coordination;Endurance;GMC;UE functional use;Balance;IADL;Pain;Dexterity;FMC;Strength;Edema;Mobility;ROM    Psychosocial Skills Environmental  Adaptations;Habits;Routines and Behaviors    Rehab Potential Good    Clinical Decision Making Several treatment options, min-mod task modification necessary   Comorbidities Affecting Occupational Performance: May have comorbidities impacting occupational performance    Modification or Assistance to Complete Evaluation  Min-Moderate modification of tasks or assist with assess necessary to complete eval    OT Frequency 2x / week    OT Duration 12 weeks    OT Treatment/Interventions Self-care/ADL training;Cryotherapy;Paraffin;Therapeutic exercise;DME and/or AE instruction;Functional Mobility Training;Balance training;Electrical Stimulation;Ultrasound;Neuromuscular education;Manual Therapy;Splinting;Moist Heat;Contrast Bath;Passive range of motion;Therapeutic activities;Patient/family education;Coping strategies training    Plan OT recert    Consulted and Agree  with Plan of Care Patient           Danelle Earthly, MS, OTR/L  [employer], OT 07/19/2022, 10:41 AM

## 2022-07-22 NOTE — Therapy (Signed)
OUTPATIENT PHYSICAL THERAPY NEURO TREATMENT   Patient Name: Teresa [name] MRN: [id number] DOB:1940/09/15, 82 y.o., female Today's Date: 07/23/2022   PCP: Dr. Aram Beecham REFERRING PROVIDER: Dr. Aram Beecham  END OF SESSION:  PT End of Session - 07/23/22 1343     Visit Number 14    Number of Visits 24    Date for PT Re-Evaluation 08/15/22    Authorization Type [employer] PPO-    Authorization Time Period 05/23/2022- 08/15/2022    Progress Note Due on Visit 10    PT Start Time 1345    PT Stop Time 1428    PT Time Calculation (min) 43 min    Equipment Utilized During Treatment Gait belt    Activity Tolerance Patient tolerated treatment well;No increased pain    Behavior During Therapy WFL for tasks assessed/performed                    Past Medical History:  Diagnosis Date   Cancer (HCC)    skin   Hypothyroidism    Raynaud's disease    Stroke [facility])    Past Surgical History:  Procedure Laterality Date   ABDOMINAL HYSTERECTOMY  1991   APPENDECTOMY  1991   BACK SURGERY  2010   Cervical fusion C4-5-6-7   CATARACT EXTRACTION, BILATERAL Bilateral 10/2016   CHOLECYSTECTOMY  1995   COLONOSCOPY WITH PROPOFOL N/A 12/08/2018   Procedure: COLONOSCOPY WITH PROPOFOL;  Surgeon: Toledo, Boykin Nearing, MD;  Location: [facility] ENDOSCOPY;  Service: Gastroenterology;  Laterality: N/A;   EYE SURGERY     Patient Active Problem List   Diagnosis Date Noted   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   Stroke (HCC) 02/27/2021   History of nonmelanoma skin cancer 05/23/2014    ONSET DATE: 04/16/2022  REFERRING DIAG: 69.354 (ICD-10-CM) - Hemiplegia and hemiparesis following cerebral infarction affecting left non-dominant side   THERAPY DIAG:  Muscle weakness (generalized)  Unsteadiness on feet  Difficulty in walking, not elsewhere classified  Other abnormalities of gait and mobility  Rationale for Evaluation and Treatment: Rehabilitation  SUBJECTIVE:                                                                                                                                                                                              SUBJECTIVE STATEMENT: Patient reports back pain when standing still.  Noticed it when making a carrot cake; straight line across low back.    Pt accompanied by: significant other  PERTINENT HISTORY: Pt is a 82 y.o. female with referral for hemiplegia with left sided weakness -original CVA on 02/26/21 and  new onset of left sided weakness on 04/16/2022.  R MCA CVA on 02/26/21. Pt completed outpatient PT on 01/17/2022 with majority of goals met. PMH includes: skin cancer, hypothyroidism, and Raynaud's disease, history of cervical fusion in 2010 C4-C7.   PAIN:  Are you having pain? No  PRECAUTIONS: Fall  WEIGHT BEARING RESTRICTIONS: No  FALLS: Has patient fallen in last 6 months? No  LIVING ENVIRONMENT: Lives with: lives with their spouse Lives in: House/apartment Stairs: Yes: External: 1 steps; none Has following equipment at home: Single point cane, Walker - 2 wheeled, shower chair, and Grab bars  PLOF: Independent  PATIENT GOALS: I want to walk without this cane and no falls and enjoy going out to grandkids sporting event  OBJECTIVE:   DIAGNOSTIC FINDINGS: CLINICAL DATA:  Stroke suspected   EXAM: MRI HEAD WITHOUT CONTRAST   TECHNIQUE: Multiplanar, multiecho pulse sequences of the brain and surrounding structures were obtained without intravenous contrast.   COMPARISON:  MRI Head 02/26/21, CT Head 04/16/22   FINDINGS: Brain: Evolving right MCA territory infarct involving the right frontal operculum in the right insular cortex. No acute infarction, hemorrhage, hydrocephalus, extra-axial collection or mass lesion. Small focus of microhemorrhage in the left temporal lobe. There is petechial hemorrhage and/or hemosiderosis in the region of prior right MCA territory infarct. T2/stir hyperintense  signal in the right cerebral peduncle is favored to represent transneuronal degeneration.   Vascular: Major flow voids are preserved.   Skull and upper cervical spine: Normal marrow signal.   Sinuses/Orbits: Bilateral lens replacement. No mastoid or middle ear effusion. Paranasal sinuses are clear.   Other: None.   IMPRESSION: 1. No acute intracranial abnormality. 2. 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 3.     Electronically Signed   By: Lorenza Cambridge M.D.   On: 04/16/2022 13:41  COGNITION: Overall cognitive status: Within functional limits for tasks assessed   SENSATION: WFL  COORDINATION: Intact with each LE  EDEMA:  None observed  MUSCLE TONE: LLE: Within functional limits  DTRs:  Patella 2+ = Normal and Achilles 2+ = Normal  POSTURE: rounded shoulders and forward head  LOWER EXTREMITY ROM:     Active  Right Eval Left Eval  Hip flexion WNL THROUGHOUT LE WNL THROUGHOUT LE  Hip extension    Hip abduction    Hip adduction    Hip internal rotation    Hip external rotation    Knee flexion    Knee extension    Ankle dorsiflexion    Ankle plantarflexion    Ankle inversion    Ankle eversion     (Blank rows = not tested)  LOWER EXTREMITY MMT:    MMT Right Eval Left Eval  Hip flexion 4+ 4  Hip extension 4+ 4  Hip abduction 4+ 4  Hip adduction 4+ 4  Hip internal rotation 4+ 4  Hip external rotation 4+ 4  Knee flexion 4+ 4  Knee extension 5 4  Ankle dorsiflexion 5 4  Ankle plantarflexion    Ankle inversion    Ankle eversion    (Blank rows = not tested)  BED MOBILITY:  Patient reports independent with all bed mobility and states has returned to master bed  TRANSFERS: Assistive device utilized: None  Sit to stand: Complete Independence Stand to sit: Complete Independence Chair to chair: Complete Independence Floor:  Not tested   GAIT: Gait pattern: step through pattern,  decreased arm swing- Right, decreased arm swing- Left,  decreased step length- Right, and decreased step length- Left Distance walked: 915 feet Assistive device utilized: None Level of assistance: SBA Comments: occasional scuff or decreased left foot clearance  FUNCTIONAL TESTS:  5 times sit to stand: 23.62 sec without UE support Timed up and go (TUG): 20.14 sec without an AD 10 meter walk test: 0.70 m/s  PATIENT SURVEYS:  FOTO 60/ goal of 66  TODAY'S TREATMENT:                                                                                                                              DATE: 07/23/22  Gait belt donned and CGA provided unless otherwise specified     Neuro Re-Ed: at support surface  ambulate across stable and unstable surface outside. Negotiating changing surfaces from grass to sidewalk, across brick with turns and obstacles in pathway without LOB.x15 minutes  Airex pad : static stand with visual scan for dual task word game x 8 minutes    Therex:  TrA activation pressing into swiss ball for core activation 10x 5 second holds Squat with UE support 15x with cue for posterior weight shift.  Single leg heel raise 15x RTB march with df  and band around toes. 15x  Sit to Stand: 15x arms crossed    PATIENT EDUCATION: Education details: Pt educated throughout session about proper posture and technique with exercises. Improved exercise technique, movement at target joints, use of target muscles after min to mod verbal, visual, tactile cues.  Person educated: Patient Education method: Explanation, Demonstration, Tactile cues, and Verbal cues Education comprehension: verbalized understanding, returned demonstration, verbal cues required, tactile cues required, and needs further education  HOME EXERCISE PROGRAM: To be updated next 1-2 visit  GOALS: Goals reviewed with patient? Yes  SHORT TERM GOALS: Target date: 07/04/2022  Patient will be independent in home  exercise program to improve strength/mobility for better functional independence with ADLs  Baseline: EVAL: Patient reports mostly just walking; 07/09/22: Pt reports her exercises are going well, to be updated future date Goal status: IN PROGRESS  LONG TERM GOALS: Target date: 08/14/2021  Pt will decrease 5TSTS by at least 6 seconds in order to demonstrate clinically significant improvement in LE strength. Baseline: EVAL= 23.62 sec without UE; 07/09/22:  13 seconds hands-free  Goal status: MET  2. Pt will improve FOTO to target score of 66 to display perceived improvements in ability to complete ADL's.  Baseline: Eval= 60; 07/09/22: 65 Goal status: PARTIALLY MET  3.  Pt will decrease TUG to below 14 seconds/decrease in order to demonstrate decreased fall risk. Baseline: Eval= 20.14 sec without AD; 07/09/22: 11 seconds no AD Goal status: MET  4.  Patient will increase six minute walk test distance to >1200 ft for improve gait ability and return to PLOF to complete her walks in her neighborhood.  Baseline: EVAL= 915 feet without an AD; 07/09/22: 1184 ft without AD Goal status: Partially MET  5. Pt will  increase by at least 0.13 m/s in order to demonstrate clinically significant improvement in community ambulation.    Baseline: Eval = 0.70 m/s; 07/09/2022: 1.04 m/s without AD   Goal status: MET   6. Pt will report that she has not hit LLE/LUE on doorframe/walls/furniture while ambulating in the past two weeks to indicate improved ability to scan environment and to decrease injury risk with gait.  Baseline: currently has hit furniture/doorframe 2x/week  Goal status: NEW   ASSESSMENT:  CLINICAL IMPRESSION: Patient introduced to TrA activation with swiss ball, reports she can feel it in her core today. Patient is eager to progress her mobility. She is able to ambulate outside with occasional foto drop but no LOB  The pt will benefit from skilled PT services to improve her overall left LE  muscle strength and improve her mobility to decrease her fall risk and improve her overall quality of life.   OBJECTIVE IMPAIRMENTS: Abnormal gait, decreased activity tolerance, decreased balance, decreased coordination, decreased endurance, decreased mobility, difficulty walking, and decreased strength.   ACTIVITY LIMITATIONS: carrying, lifting, bending, standing, squatting, stairs, and transfers  PARTICIPATION LIMITATIONS: cleaning, laundry, driving, shopping, community activity, and yard work  PERSONAL FACTORS: 3+ comorbidities: OA, HTN, hx of previous CVA  are also affecting patient's functional outcome.   REHAB POTENTIAL: Good  CLINICAL DECISION MAKING: Evolving/moderate complexity  EVALUATION COMPLEXITY: Moderate  PLAN:  PT FREQUENCY: 1-2x/week  PT DURATION: 12 weeks  PLANNED INTERVENTIONS: Therapeutic exercises, Therapeutic activity, Neuromuscular re-education, Balance training, Gait training, Patient/Family education, Self Care, Joint mobilization, Joint manipulation, Stair training, Vestibular training, Canalith repositioning, DME instructions, Dry Needling, Electrical stimulation, Spinal manipulation, Spinal mobilization, Cryotherapy, Moist heat, Taping, and Manual therapy  PLAN FOR NEXT SESSION: Progressive balance training and LE strengthening and add to HEP as appropriate next session.   Precious Bard PT  Physical Therapist- [place]  [facility]  07/23/2022, 2:32 PM

## 2022-07-23 ENCOUNTER — Ambulatory Visit: Payer: Medicare PPO

## 2022-07-23 DIAGNOSIS — I63511 Cerebral infarction due to unspecified occlusion or stenosis of right middle cerebral artery: Secondary | ICD-10-CM

## 2022-07-23 DIAGNOSIS — R278 Other lack of coordination: Secondary | ICD-10-CM

## 2022-07-23 DIAGNOSIS — R2689 Other abnormalities of gait and mobility: Secondary | ICD-10-CM

## 2022-07-23 DIAGNOSIS — M6281 Muscle weakness (generalized): Secondary | ICD-10-CM

## 2022-07-23 DIAGNOSIS — R2681 Unsteadiness on feet: Secondary | ICD-10-CM

## 2022-07-23 DIAGNOSIS — R262 Difficulty in walking, not elsewhere classified: Secondary | ICD-10-CM

## 2022-07-23 NOTE — Therapy (Signed)
OUTPATIENT OCCUPATIONAL THERAPY NEURO TREATMENT NOTE          Patient Name: Teresa [name] MRN: [id number] DOB:05-Oct-1940, 82 y.o., female Today's Date: 05/23/2022  PCP: Dr. Aram Beecham REFERRING PROVIDER: Dr. Aram Beecham   OT End of Session - 07/23/22 1655     Visit Number [id number]    Number of Visits 138    Date for OT Re-Evaluation 10/10/22    Authorization Time Period Reporting period beginning 07/04/22    Progress Note Due on Visit 10    OT Start Time 1430    OT Stop Time 1515    OT Time Calculation (min) 45 min    Equipment Utilized During Treatment cane    Activity Tolerance Patient tolerated treatment well    Behavior During Therapy WFL for tasks assessed/performed            Past Medical History:  Diagnosis Date   Cancer (HCC)    skin   Hypothyroidism    Raynaud's disease    Stroke [facility])    Past Surgical History:  Procedure Laterality Date   ABDOMINAL HYSTERECTOMY  1991   APPENDECTOMY  1991   BACK SURGERY  2010   Cervical fusion C4-5-6-7   CATARACT EXTRACTION, BILATERAL Bilateral 10/2016   CHOLECYSTECTOMY  1995   COLONOSCOPY WITH PROPOFOL N/A 12/08/2018   Procedure: COLONOSCOPY WITH PROPOFOL;  Surgeon: Toledo, Boykin Nearing, MD;  Location: [facility] ENDOSCOPY;  Service: Gastroenterology;  Laterality: N/A;   EYE SURGERY     Patient Active Problem List   Diagnosis Date Noted   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   Stroke (HCC) 02/27/2021   History of nonmelanoma skin cancer 05/23/2014   ONSET DATE: 02/26/2021  REFERRING DIAG: R MCA CVA  THERAPY DIAG:  Muscle weakness (generalized)  Other lack of coordination  Right middle cerebral artery stroke [facility])  Rationale for Evaluation and Treatment Rehabilitation  PERTINENT HISTORY: February 26, 2021, pt reports she had a CVA, came to [facility] to ER and then was transferred to [facility] in [place] where she was admitted and after acute care she went to inpatient rehab.  Following  inpt rehab, pt went home and had home health.   PRECAUTIONS: fall  SUBJECTIVE: Pt showing OT new bruises on both arms today after bumping into doorways.  Pt reports no real pain and bruises easily d/t medications.  PAIN:  Are you having pain? none  OBJECTIVE:  L grip 6, R grip 41 L lateral pinch 5, R 13 L 3 point pinch 4, R 14  L 9 hole 1 min 43 sec. L shoulder active flexion 0-105, passive 0-125 02/05/22: L grip 13#  L lateral pinch 8#  3 point pinch 5#  L shoulder flex 0-108 03/12/22:  L grip: 12# L lateral pinch: 8# 3 point pinch: 6# L 9 hole: 1 min 3 sec L shoulder flexion 0-110 04/16/22: L grip: 3# L lateral pinch: 4# 3 point pinch: difficulty maintaining 3 point pinch  L 9 hole: 5 pegs in 2 min 36 sec  L shoulder flexion 0-107 BP L 132/79 HR 80 02 sats 96% 04/25/22: L grip: 14#; R grip: 50# L lateral 2#, R lateral 17# 3 point pinch: L 4#, R 14# (Saehan pinch gauge) 9 hole peg test: 1 min, 48 sec L shoulder flex 125 (R shoulder 132 active) 05/21/22: L grip: 15#; R grip: 50# L lateral 8#, R lateral 20# 3 point pinch: L 4#, R 12# (Saehan pinch gauge) 9 hole  peg test: 1 min, 23 sec 07/04/22: L grip: 19# L lateral 10# 3 point pinch: L 6# (standard pinch gauge-not Saehan) 9 hole peg test: 1 min, 14 sec 07/18/22:  L grip 22# L lateral: 9# L 3 point pinch: 6# L 9 hole: 1 min 8 sec   TODAY'S TREATMENT: Therapeutic Exercise: Facilitated hand strengthening with use of hand gripper set at 20# to complete 4 sets 10 reps of grip squeezes, min vc to ensure SF was engaged with each gripping rep.    Neuro re-ed: Practiced dual tasking while holding a 3# weight in the L hand, while bouncing/tossing/catching a tennis ball in the R hand.  Pt practiced holding the weight using a hook grasp to simulate carrying a grocery bag.  Also held the weight with elbow flexed at 90 and forearm supinated to simulate holding a plate.  Transitioned to forearm with neutral pron/sup to  simulate holding a cup.  Pt held dumbbell in the L hand for 1 min increments with constant cues to increase grip and maintain weight level.  Practiced these same positions in sitting using mirror for visual feedback.  Pt able to maintain weight level in L hand for 1 min increments 80-90% of the time while looking in the mirror.    Therapeutic Activity: Participated in L hand coin manipulation activities with coin pick up from wash cloth for non-skid surface on table top.  Pt practiced pushing 1 coin at a time through resistive slotted bank.  Bank lid was moved to table top level (removed from bank which would have promoted more shoulder flexion) while pt pushed coins through d/t LUE fatigue from dumbbell activity noted above.   PATIENT EDUCATION: Education details: Need for visual attention to LUE to hold objects level in L hand Person educated: pt Education method: explanation, demo, visual and vc Education comprehension: verbalized understanding, demonstrated understanding  HOME EXERCISE PROGRAM Continue to engage LUE into ADLs; continue use of putty for gripping and pinching exercises for LUE, and L shoulder AROM/AAROM, crocheting, typing; increase participation in IADL tasks for greater use of L hand.    OT Short Term Goals - 08/29/22 (6 weeks)      OT SHORT TERM GOAL #1   Title Pt will be independent with home exercise program.    Baseline Eval: no current program, 10th visit:  continue to add new exercises as pt progresses, 20th:  continue to update HEP; 08/17/21: continue to progress HEP when indicated.  Visit 40: adding new exercises as pt progresses; 12/25/21: ongoing with progressions; 03/12/22: inconsistent use of putty but regular use of stress ball (every other day).  Encouraged pt return to daily putty use (2x daily when able) to target grip and pinch strengthening; 04/16/22: not reviewed today d/t OT sent pt to ED; 04/25/22: added pulleys and pt has re-started putty; 05/21/22: stopped  pulleys and added stronger theraputty and encouraged pt crochet often; 07/04/22: pt working consistently with putty and grip strengthener at 20# at home; 07/18/22: pt continues to work on putty and Building surveyor and will be starting with her crocheting again to progress [facility] skills   Time 12    Period Weeks    Status On-going    Target Date 08/29/22     OT LONG TERM GOAL #2   Title Pt will complete UB and LB dressing with modified independence including buttons, snaps and zippers.    Baseline requires min assist at eval, 10th visit: occasional assist with buttons, 20th:  able  to perform one handed, but difficulty with bilateral UE; 08/17/21: pt reports inconsistent with 1 hand, reviewed techniques this visit, Visit 40:  Pt requires assist with bra   Time 12    Period Weeks    Status MET   Target Date 11/08/21     OT LONG TERM GOAL #3   Title Pt will perform shower transfer with modified independence.    Baseline Pt requires supervision to min assist for shower transfer at home. 10th visit: supervision; 08/17/21: supv .  Visit 40:  Pt had met goal but had recent fall and now requiring min guard to supv   Time 6    Period Weeks    Status  Met    Target Date 11/08/21      OT LONG TERM GOAL #4 10/10/22 (12 weeks)    Title Pt will improve L hand grip to 25 or more #s to enable pt to open a new jar.   Baseline no grip in left hand at eval, 10th visit:  improved flexion but still working towards composite fisting and grip. 20th:  continues to demo decreased grip; 08/17/21: active digit flexion improving, but not yet able to register grip on dynamometer; 09/04/21: L grip 1# 7/17:  5#; 12/25/21: L grip 6#; 02/05/22: 13#; 03/12/22: L grip 12#; 1/23//24: L grip 3# (decreased); 04/25/22: L grip 14#; spouse assists to open new jars; 05/21/22: L grip 15#; 07/04/22: L grip 19#; 07/18/22: L grip 22#, difficulty opening a new jar (R grip 50#)   Time 12    Period Weeks    Status Revised/ongoing   Target Date 10/10/22      OT LONG TERM GOAL #5   Title Pt will improve left shoulder flexion to 100 degrees or better to improve reaching to obtain self care items from shelf/shoulder height.    Baseline difficulty with reach, shoulder flexion to 47 degrees; 08/17/21: L shoulder flexion 85, but not yet able to consistently hold ADL supplies in L hand when reaching; 09/04/21: flexion 85 7/17:  shoulder flexion to 90; 12/25/21: 105 (P 125); 03/12/22: active L shoulder flexion 110 (pt uses R arm to reach for items at shoulder height or above); 04/16/22: L shoulder flexion 107; 04/25/22: L shoulder flexion 125*, R 132*    Time 12    Period Weeks    Status achieved   Target Date 04/25/22     OT LONG TERM GOAL #6   Title Pt will improve FOTO score to 47 or above to demonstrate a clinically relevant change in function to impact ADL tasks.    Baseline score of 30 at eval; 08/17/21: FOTO: 42; 09/04/21: FOTO : 54 , FOTO 7/7:  60; 12/25/21: 59; 03/12/22: 66; 04/16/22: 67; 04/25/22; 51; 05/21/22: 74; 07/04/22: 74; 07/18/22: 71   Time 12    Period Weeks    Status  MET/ongoing    Target Date 10/10/22     OT LONG TERM GOAL #7   Title Pt will demonstrate ability to pick up small objects with L hand and complete 9 hole peg test in less than 1 min (revised)   Baseline unable to perform at eval, 10th visit: still unable to pick up small pegs, 20th: unable to pick up pegs but improving; 08/17/21: not attempted d/t time constraints, will assess next visit; 09/04/21: 9 hole peg test in 5 min 53 sec;  7/17:  Pt unable to complete this date but was able to place 6 of 9 pegs in 4 mins  20 secs; 12/25/21: L 1 min 43 sec; 03/12/22: L 1 min 2 sec (requires non-skid surface to pick up small items from table top); 04/16/22: 5 pegs in 2 min 36 secs; 04/25/22: 1 min 48 sec; 05/21/22: L 1 min 23 sec; 07/04/22: 1 min 14 sec    Time 12    Period Weeks    Status Revised/On-going    Target Date 10/10/22    OT LONG TERM GOAL #8   Title Pt will increase LUE strength by 1 MM  grade in order to hold blow dryer in LUE to dry hair without dropping dryer.   Baseline 04/25/22: Unable to sustain grip and maintain lift of LUE with hand near head without dropping dryer.  Dryer has caused bruising above L eye with previous attempts.  L shoulder flex/abd 3/5, elbow flex/ext 3+/5; 07/04/22: L shoulder flex/abd 3+, elbow and wrist flex/ext 4+    Time 12    Period Weeks    Status New/ ongoing   Target Date 10/10/22        OT LONG TERM GOAL #9   Title Pt will increase L hand dexterity to enable pt to independently manage clothing fasteners.   Baseline 04/25/22: pt ties shoe laces loosely and requires assist from spouse occasionally.  When typing, pt engages the L hand for ~25% of the task.  Pt avoids wearing jeans d/t inability to manage button or zipper; 07/04/22: no change yet from 04/25/22; 07/18/22: Managed zipper today, but increased time and effort to manage buttons on pants; able to tie shoes loosely with increased time   Time 12    Period Weeks    Status New    Target Date 10/10/22        OT LONG TERM GOAL #10   Title Pt will be able to independently carry a light plate of food or drink in L hand without spilling/dropping to increase efficiency with item transport in the kitchen.   Baseline 04/25/22: Pt requires constant cues to keep cup or plate level while multitasking in order to prevent spilling; 07/04/22: Pt reports she is carrying her coffee cup from kitchen to neighboring room (not yet consistent to avoid spills)    Time 12    Period Weeks    Status New/ongoing   Target Date 10/10/22     Plan -     Clinical Impression Statement Pt practiced dual tasking while holding a 3# weight in the L hand using a hook grasp to simulate carrying a grocery bag.  Also held the weight with elbow flexed at 90 and forearm supinated to simulate holding a plate.  Transitioned to forearm with neutral pron/sup to simulate holding a cup.  Pt held dumbbell in the L hand for 1 min increments with  constant cues to increase grip and maintain weight level.  Practiced these same positions in sitting using mirror for visual feedback.  Pt able to maintain weight level in L hand for 1 min increments 80-90% of the time while looking in the mirror.  Reinforced current need to keep eyes on L hand when holding and transporting items in L hand to prevent dropping or spills.  Pt will continue to work towards goals in plan of care to improve left UE functional use in ADL and IADL tasks at home and in the community.    OT Occupational Profile and History Detailed Assessment- Review of Records and additional review of physical, cognitive, psychosocial history related to current functional performance  Occupational performance deficits (Please refer to evaluation for details): ADL's;IADL's;Leisure;Rest and Sleep    Body Structure / Function / Physical Skills ADL;Coordination;Endurance;GMC;UE functional use;Balance;IADL;Pain;Dexterity;FMC;Strength;Edema;Mobility;ROM    Psychosocial Skills Environmental  Adaptations;Habits;Routines and Behaviors    Rehab Potential Good    Clinical Decision Making Several treatment options, min-mod task modification necessary   Comorbidities Affecting Occupational Performance: May have comorbidities impacting occupational performance    Modification or Assistance to Complete Evaluation  Min-Moderate modification of tasks or assist with assess necessary to complete eval    OT Frequency 2x / week    OT Duration 12 weeks    OT Treatment/Interventions Self-care/ADL training;Cryotherapy;Paraffin;Therapeutic exercise;DME and/or AE instruction;Functional Mobility Training;Balance training;Electrical Stimulation;Ultrasound;Neuromuscular education;Manual Therapy;Splinting;Moist Heat;Contrast Bath;Passive range of motion;Therapeutic activities;Patient/family education;Coping strategies training    Plan OT recert    Consulted and Agree with Plan of Care Patient           Danelle Earthly, MS, OTR/L  [employer] Dials, OT 07/23/2022, 4:57 PM

## 2022-07-24 NOTE — Therapy (Signed)
OUTPATIENT PHYSICAL THERAPY NEURO TREATMENT   Patient Name: Teresa [name] MRN: [id number] DOB:05/07/1940, 82 y.o., female Today's Date: 07/25/2022   PCP: Dr. Aram Beecham REFERRING PROVIDER: Dr. Aram Beecham  END OF SESSION:  PT End of Session - 07/25/22 1301     Visit Number 15    Number of Visits 24    Date for PT Re-Evaluation 08/15/22    Authorization Type [employer] PPO-    Authorization Time Period 05/23/2022- 08/15/2022    Progress Note Due on Visit 10    PT Start Time 1300    PT Stop Time 1344    PT Time Calculation (min) 44 min    Equipment Utilized During Treatment Gait belt    Activity Tolerance Patient tolerated treatment well;No increased pain    Behavior During Therapy WFL for tasks assessed/performed                     Past Medical History:  Diagnosis Date   Cancer (HCC)    skin   Hypothyroidism    Raynaud's disease    Stroke [facility])    Past Surgical History:  Procedure Laterality Date   ABDOMINAL HYSTERECTOMY  1991   APPENDECTOMY  1991   BACK SURGERY  2010   Cervical fusion C4-5-6-7   CATARACT EXTRACTION, BILATERAL Bilateral 10/2016   CHOLECYSTECTOMY  1995   COLONOSCOPY WITH PROPOFOL N/A 12/08/2018   Procedure: COLONOSCOPY WITH PROPOFOL;  Surgeon: Toledo, Boykin Nearing, MD;  Location: [facility] ENDOSCOPY;  Service: Gastroenterology;  Laterality: N/A;   EYE SURGERY     Patient Active Problem List   Diagnosis Date Noted   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   Stroke (HCC) 02/27/2021   History of nonmelanoma skin cancer 05/23/2014    ONSET DATE: 04/16/2022  REFERRING DIAG: 69.354 (ICD-10-CM) - Hemiplegia and hemiparesis following cerebral infarction affecting left non-dominant side   THERAPY DIAG:  Muscle weakness (generalized)  Unsteadiness on feet  Difficulty in walking, not elsewhere classified  Other abnormalities of gait and mobility  Rationale for Evaluation and Treatment: Rehabilitation  SUBJECTIVE:                                                                                                                                                                                              SUBJECTIVE STATEMENT: Patient ran into the door at the closet and has a large bruise on her L arm.   Pt accompanied by: significant other  PERTINENT HISTORY: Pt is a 82 y.o. female with referral for hemiplegia with left sided weakness -original CVA on 02/26/21 and new onset of  left sided weakness on 04/16/2022.  R MCA CVA on 02/26/21. Pt completed outpatient PT on 01/17/2022 with majority of goals met. PMH includes: skin cancer, hypothyroidism, and Raynaud's disease, history of cervical fusion in 2010 C4-C7.   PAIN:  Are you having pain? No  PRECAUTIONS: Fall  WEIGHT BEARING RESTRICTIONS: No  FALLS: Has patient fallen in last 6 months? No  LIVING ENVIRONMENT: Lives with: lives with their spouse Lives in: House/apartment Stairs: Yes: External: 1 steps; none Has following equipment at home: Single point cane, Walker - 2 wheeled, shower chair, and Grab bars  PLOF: Independent  PATIENT GOALS: I want to walk without this cane and no falls and enjoy going out to grandkids sporting event  OBJECTIVE:   DIAGNOSTIC FINDINGS: CLINICAL DATA:  Stroke suspected   EXAM: MRI HEAD WITHOUT CONTRAST   TECHNIQUE: Multiplanar, multiecho pulse sequences of the brain and surrounding structures were obtained without intravenous contrast.   COMPARISON:  MRI Head 02/26/21, CT Head 04/16/22   FINDINGS: Brain: Evolving right MCA territory infarct involving the right frontal operculum in the right insular cortex. No acute infarction, hemorrhage, hydrocephalus, extra-axial collection or mass lesion. Small focus of microhemorrhage in the left temporal lobe. There is petechial hemorrhage and/or hemosiderosis in the region of prior right MCA territory infarct. T2/stir hyperintense signal in the right cerebral peduncle is  favored to represent transneuronal degeneration.   Vascular: Major flow voids are preserved.   Skull and upper cervical spine: Normal marrow signal.   Sinuses/Orbits: Bilateral lens replacement. No mastoid or middle ear effusion. Paranasal sinuses are clear.   Other: None.   IMPRESSION: 1. No acute intracranial abnormality. 2. 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 3.     Electronically Signed   By: Lorenza Cambridge M.D.   On: 04/16/2022 13:41  COGNITION: Overall cognitive status: Within functional limits for tasks assessed   SENSATION: WFL  COORDINATION: Intact with each LE  EDEMA:  None observed  MUSCLE TONE: LLE: Within functional limits  DTRs:  Patella 2+ = Normal and Achilles 2+ = Normal  POSTURE: rounded shoulders and forward head  LOWER EXTREMITY ROM:     Active  Right Eval Left Eval  Hip flexion WNL THROUGHOUT LE WNL THROUGHOUT LE  Hip extension    Hip abduction    Hip adduction    Hip internal rotation    Hip external rotation    Knee flexion    Knee extension    Ankle dorsiflexion    Ankle plantarflexion    Ankle inversion    Ankle eversion     (Blank rows = not tested)  LOWER EXTREMITY MMT:    MMT Right Eval Left Eval  Hip flexion 4+ 4  Hip extension 4+ 4  Hip abduction 4+ 4  Hip adduction 4+ 4  Hip internal rotation 4+ 4  Hip external rotation 4+ 4  Knee flexion 4+ 4  Knee extension 5 4  Ankle dorsiflexion 5 4  Ankle plantarflexion    Ankle inversion    Ankle eversion    (Blank rows = not tested)  BED MOBILITY:  Patient reports independent with all bed mobility and states has returned to master bed  TRANSFERS: Assistive device utilized: None  Sit to stand: Complete Independence Stand to sit: Complete Independence Chair to chair: Complete Independence Floor:  Not tested   GAIT: Gait pattern: step through pattern, decreased arm swing- Right, decreased arm  swing- Left, decreased step length-  Right, and decreased step length- Left Distance walked: 915 feet Assistive device utilized: None Level of assistance: SBA Comments: occasional scuff or decreased left foot clearance  FUNCTIONAL TESTS:  5 times sit to stand: 23.62 sec without UE support Timed up and go (TUG): 20.14 sec without an AD 10 meter walk test: 0.70 m/s  PATIENT SURVEYS:  FOTO 60/ goal of 66  TODAY'S TREATMENT:                                                                                                                              DATE: 07/25/22  Gait belt donned and CGA provided unless otherwise specified     Neuro Re-Ed: at support surface  Standing with CGA next to support surface:  Airex pad: static stand 30 seconds x 2 trials, noticeable trembling of ankles/LE's with fatigue and challenge to maintain stability Airex pad: horizontal head turns 30 seconds scanning room 10x ; cueing for arc of motion  Airex pad: vertical head turns 30 seconds, cueing for arc of motion, noticeable sway with upward gaze increasing demand on ankle righting reaction musculature Airex pad: one foot on 6" step one foot on airex pad, hold position for 30 seconds, switch legs, 2x each LE;   Airex pad : static stand with visual scan for dual task word game x 8 minutes   Activity Description: on airex pad: tap pod that lights up Activity Setting: The Blaze Pod Random setting was chosen to enhance cognitive processing and agility, providing an unpredictable environment to simulate real-world scenarios, and fostering quick reactions and adaptability.     Number of Pods:  6 Cycles/Sets:  2 Duration (Time or Hit Count):  10  Activity Description: hit the blue pod while on airex pad Activity Setting:  The [facility] Focus setting was selected to refine precision and concentration, isolating specific muscle groups or movements to enhance overall coordination and targeted muscle engagement.   Number of Pods:  6 Cycles/Sets:  2 Duration (Time or Hit Count):  10      Therex:  TrA activation pressing into swiss ball for core activation 15x 5 second holds TrA activation with UE raise 10x each UE   2lb ankle weights: Superset ambulate 150 ft (cues for picking up feet) with 10x STS: 2 trials   PATIENT EDUCATION: Education details: Pt educated throughout session about proper posture and technique with exercises. Improved exercise technique, movement at target joints, use of target muscles after min to mod verbal, visual, tactile cues.  Person educated: Patient Education method: Explanation, Demonstration, Tactile cues, and Verbal cues Education comprehension: verbalized understanding, returned demonstration, verbal cues required, tactile cues required, and needs further education  HOME EXERCISE PROGRAM: To be updated next 1-2 visit  GOALS: Goals reviewed with patient? Yes  SHORT TERM GOALS: Target date: 07/04/2022  Patient will be independent in home exercise program to improve strength/mobility for better functional independence with ADLs  Baseline: EVAL: Patient reports  mostly just walking; 07/09/22: Pt reports her exercises are going well, to be updated future date Goal status: IN PROGRESS  LONG TERM GOALS: Target date: 08/14/2021  Pt will decrease 5TSTS by at least 6 seconds in order to demonstrate clinically significant improvement in LE strength. Baseline: EVAL= 23.62 sec without UE; 07/09/22:  13 seconds hands-free  Goal status: MET  2. Pt will improve FOTO to target score of 66 to display perceived improvements in ability to complete ADL's.  Baseline: Eval= 60; 07/09/22: 65 Goal status: PARTIALLY MET  3.  Pt will decrease TUG to below 14 seconds/decrease in order to demonstrate decreased fall risk. Baseline: Eval= 20.14 sec without AD; 07/09/22: 11 seconds no AD Goal status: MET  4.  Patient will increase six minute walk test distance to >1200 ft for improve  gait ability and return to PLOF to complete her walks in her neighborhood.  Baseline: EVAL= 915 feet without an AD; 07/09/22: 1184 ft without AD Goal status: Partially MET  5. Pt will increase by at least 0.13 m/s in order to demonstrate clinically significant improvement in community ambulation.    Baseline: Eval = 0.70 m/s; 07/09/2022: 1.04 m/s without AD   Goal status: MET   6. Pt will report that she has not hit LLE/LUE on doorframe/walls/furniture while ambulating in the past two weeks to indicate improved ability to scan environment and to decrease injury risk with gait.  Baseline: currently has hit furniture/doorframe 2x/week  Goal status: NEW   ASSESSMENT:  CLINICAL IMPRESSION: Patient is highly motivated throughout session. She is able to tolerate supersetting exercises without pain just "popping" of knees and neck. Tandem stance is challenging for patient. Patient is eager to progress her functional mobility with improved stability and less foot drop. She is leaving for her granddaughters graduation and will miss a session.  The pt will benefit from skilled PT services to improve her overall left LE muscle strength and improve her mobility to decrease her fall risk and improve her overall quality of life.   OBJECTIVE IMPAIRMENTS: Abnormal gait, decreased activity tolerance, decreased balance, decreased coordination, decreased endurance, decreased mobility, difficulty walking, and decreased strength.   ACTIVITY LIMITATIONS: carrying, lifting, bending, standing, squatting, stairs, and transfers  PARTICIPATION LIMITATIONS: cleaning, laundry, driving, shopping, community activity, and yard work  PERSONAL FACTORS: 3+ comorbidities: OA, HTN, hx of previous CVA  are also affecting patient's functional outcome.   REHAB POTENTIAL: Good  CLINICAL DECISION MAKING: Evolving/moderate complexity  EVALUATION COMPLEXITY: Moderate  PLAN:  PT FREQUENCY: 1-2x/week  PT DURATION: 12  weeks  PLANNED INTERVENTIONS: Therapeutic exercises, Therapeutic activity, Neuromuscular re-education, Balance training, Gait training, Patient/Family education, Self Care, Joint mobilization, Joint manipulation, Stair training, Vestibular training, Canalith repositioning, DME instructions, Dry Needling, Electrical stimulation, Spinal manipulation, Spinal mobilization, Cryotherapy, Moist heat, Taping, and Manual therapy  PLAN FOR NEXT SESSION: Progressive balance training and LE strengthening and add to HEP as appropriate next session.   Precious Bard PT  Physical Therapist- [facility]  07/25/2022, 1:52 PM

## 2022-07-25 ENCOUNTER — Ambulatory Visit: Payer: Medicare HMO

## 2022-07-25 ENCOUNTER — Ambulatory Visit: Payer: Medicare HMO | Attending: Internal Medicine

## 2022-07-25 DIAGNOSIS — R262 Difficulty in walking, not elsewhere classified: Secondary | ICD-10-CM | POA: Diagnosis not present

## 2022-07-25 DIAGNOSIS — M6281 Muscle weakness (generalized): Secondary | ICD-10-CM | POA: Diagnosis not present

## 2022-07-25 DIAGNOSIS — R482 Apraxia: Secondary | ICD-10-CM | POA: Insufficient documentation

## 2022-07-25 DIAGNOSIS — R2681 Unsteadiness on feet: Secondary | ICD-10-CM | POA: Insufficient documentation

## 2022-07-25 DIAGNOSIS — R2689 Other abnormalities of gait and mobility: Secondary | ICD-10-CM

## 2022-07-25 DIAGNOSIS — I63511 Cerebral infarction due to unspecified occlusion or stenosis of right middle cerebral artery: Secondary | ICD-10-CM | POA: Insufficient documentation

## 2022-07-25 DIAGNOSIS — R278 Other lack of coordination: Secondary | ICD-10-CM | POA: Diagnosis not present

## 2022-07-25 DIAGNOSIS — M79604 Pain in right leg: Secondary | ICD-10-CM | POA: Diagnosis not present

## 2022-07-26 NOTE — Therapy (Signed)
OUTPATIENT OCCUPATIONAL THERAPY NEURO TREATMENT NOTE          Patient Name: Teresa [name] MRN: [id number] DOB:1940/08/07, 82 y.o., female Today's Date: 05/23/2022  PCP: Dr. Aram Beecham REFERRING PROVIDER: Dr. Aram Beecham   OT End of Session - 07/26/22 0940     Visit Number [id number]    Number of Visits 138    Date for OT Re-Evaluation 10/10/22    Authorization Time Period Reporting period beginning 07/04/22    Progress Note Due on Visit 10    OT Start Time 1345    OT Stop Time 1430    OT Time Calculation (min) 45 min    Equipment Utilized During Treatment cane    Activity Tolerance Patient tolerated treatment well    Behavior During Therapy WFL for tasks assessed/performed            Past Medical History:  Diagnosis Date   Cancer (HCC)    skin   Hypothyroidism    Raynaud's disease    Stroke [facility])    Past Surgical History:  Procedure Laterality Date   ABDOMINAL HYSTERECTOMY  1991   APPENDECTOMY  1991   BACK SURGERY  2010   Cervical fusion C4-5-6-7   CATARACT EXTRACTION, BILATERAL Bilateral 10/2016   CHOLECYSTECTOMY  1995   COLONOSCOPY WITH PROPOFOL N/A 12/08/2018   Procedure: COLONOSCOPY WITH PROPOFOL;  Surgeon: Toledo, Boykin Nearing, MD;  Location: [facility] ENDOSCOPY;  Service: Gastroenterology;  Laterality: N/A;   EYE SURGERY     Patient Active Problem List   Diagnosis Date Noted   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   Stroke (HCC) 02/27/2021   History of nonmelanoma skin cancer 05/23/2014   ONSET DATE: 02/26/2021  REFERRING DIAG: R MCA CVA  THERAPY DIAG:  Muscle weakness (generalized)  Other lack of coordination  Right middle cerebral artery stroke [facility])  Rationale for Evaluation and Treatment Rehabilitation  PERTINENT HISTORY: February 26, 2021, pt reports she had a CVA, came to [facility] to ER and then was transferred to [facility] in [place] where she was admitted and after acute care she went to inpatient rehab.  Following  inpt rehab, pt went home and had home health.   PRECAUTIONS: fall  SUBJECTIVE: Pt inquired about whether there was something to cover her arm d/t all the bruising pt has from bumping into doorways/furniture.  OT advised pt in wearing long sleeve shirt as tubigrip compression or stockinette are not thick enough to provide any protection for prevention of bruising.  Pt verbalized understanding.  PAIN:  Are you having pain? none  OBJECTIVE:  L grip 6, R grip 41 L lateral pinch 5, R 13 L 3 point pinch 4, R 14  L 9 hole 1 min 43 sec. L shoulder active flexion 0-105, passive 0-125 02/05/22: L grip 13#  L lateral pinch 8#  3 point pinch 5#  L shoulder flex 0-108 03/12/22:  L grip: 12# L lateral pinch: 8# 3 point pinch: 6# L 9 hole: 1 min 3 sec L shoulder flexion 0-110 04/16/22: L grip: 3# L lateral pinch: 4# 3 point pinch: difficulty maintaining 3 point pinch  L 9 hole: 5 pegs in 2 min 36 sec  L shoulder flexion 0-107 BP L 132/79 HR 80 02 sats 96% 04/25/22: L grip: 14#; R grip: 50# L lateral 2#, R lateral 17# 3 point pinch: L 4#, R 14# (Saehan pinch gauge) 9 hole peg test: 1 min, 48 sec L shoulder flex 125 (R shoulder  132 active) 05/21/22: L grip: 15#; R grip: 50# L lateral 8#, R lateral 20# 3 point pinch: L 4#, R 12# (Saehan pinch gauge) 9 hole peg test: 1 min, 23 sec 07/04/22: L grip: 19# L lateral 10# 3 point pinch: L 6# (standard pinch gauge-not Saehan) 9 hole peg test: 1 min, 14 sec 07/18/22:  L grip 22# L lateral: 9# L 3 point pinch: 6# L 9 hole: 1 min 8 sec   TODAY'S TREATMENT: Therapeutic Exercise: Facilitated hand strengthening with use of hand gripper set at 11.2# to remove jumbo pegs from pegboard x3 trials using L hand.  Pt required frequent assist to reposition hand gripper in L hand to prevent dropping and to wrap the L SF around gripper to engage this digit with each gripping rep.  Rest breaks between each set and several during sets.  Neuro re-ed: LUE  stereognosis assessed; pt accurately able to identify 2 of 7 items in L hand.  Participated in proprioceptive/spatial awareness activities for the LUE with pt practicing positional changes of a dumbbell in hand with eyes closed.  Pt also practiced holding a plate in L hand and making positional changes to keep plate level with eyes closed.    PATIENT EDUCATION: Education details: Need for visual attention to LUE to hold objects level in L hand Person educated: pt Education method: explanation, demo, visual and vc Education comprehension: verbalized understanding, demonstrated understanding  HOME EXERCISE PROGRAM Continue to engage LUE into ADLs; continue use of putty for gripping and pinching exercises for LUE, and L shoulder AROM/AAROM, crocheting, typing; increase participation in IADL tasks for greater use of L hand.    OT Short Term Goals - 08/29/22 (6 weeks)      OT SHORT TERM GOAL #1   Title Pt will be independent with home exercise program.    Baseline Eval: no current program, 10th visit:  continue to add new exercises as pt progresses, 20th:  continue to update HEP; 08/17/21: continue to progress HEP when indicated.  Visit 40: adding new exercises as pt progresses; 12/25/21: ongoing with progressions; 03/12/22: inconsistent use of putty but regular use of stress ball (every other day).  Encouraged pt return to daily putty use (2x daily when able) to target grip and pinch strengthening; 04/16/22: not reviewed today d/t OT sent pt to ED; 04/25/22: added pulleys and pt has re-started putty; 05/21/22: stopped pulleys and added stronger theraputty and encouraged pt crochet often; 07/04/22: pt working consistently with putty and grip strengthener at 20# at home; 07/18/22: pt continues to work on putty and Building surveyor and will be starting with her crocheting again to progress [facility] skills   Time 12    Period Weeks    Status On-going    Target Date 08/29/22     OT LONG TERM GOAL #2   Title Pt will  complete UB and LB dressing with modified independence including buttons, snaps and zippers.    Baseline requires min assist at eval, 10th visit: occasional assist with buttons, 20th:  able to perform one handed, but difficulty with bilateral UE; 08/17/21: pt reports inconsistent with 1 hand, reviewed techniques this visit, Visit 40:  Pt requires assist with bra   Time 12    Period Weeks    Status MET   Target Date 11/08/21     OT LONG TERM GOAL #3   Title Pt will perform shower transfer with modified independence.    Baseline Pt requires supervision to min assist for  shower transfer at home. 10th visit: supervision; 08/17/21: supv .  Visit 40:  Pt had met goal but had recent fall and now requiring min guard to supv   Time 6    Period Weeks    Status  Met    Target Date 11/08/21      OT LONG TERM GOAL #4 10/10/22 (12 weeks)    Title Pt will improve L hand grip to 25 or more #s to enable pt to open a new jar.   Baseline no grip in left hand at eval, 10th visit:  improved flexion but still working towards composite fisting and grip. 20th:  continues to demo decreased grip; 08/17/21: active digit flexion improving, but not yet able to register grip on dynamometer; 09/04/21: L grip 1# 7/17:  5#; 12/25/21: L grip 6#; 02/05/22: 13#; 03/12/22: L grip 12#; 1/23//24: L grip 3# (decreased); 04/25/22: L grip 14#; spouse assists to open new jars; 05/21/22: L grip 15#; 07/04/22: L grip 19#; 07/18/22: L grip 22#, difficulty opening a new jar (R grip 50#)   Time 12    Period Weeks    Status Revised/ongoing   Target Date 10/10/22     OT LONG TERM GOAL #5   Title Pt will improve left shoulder flexion to 100 degrees or better to improve reaching to obtain self care items from shelf/shoulder height.    Baseline difficulty with reach, shoulder flexion to 47 degrees; 08/17/21: L shoulder flexion 85, but not yet able to consistently hold ADL supplies in L hand when reaching; 09/04/21: flexion 85 7/17:  shoulder flexion to 90;  12/25/21: 105 (P 125); 03/12/22: active L shoulder flexion 110 (pt uses R arm to reach for items at shoulder height or above); 04/16/22: L shoulder flexion 107; 04/25/22: L shoulder flexion 125*, R 132*    Time 12    Period Weeks    Status achieved   Target Date 04/25/22     OT LONG TERM GOAL #6   Title Pt will improve FOTO score to 47 or above to demonstrate a clinically relevant change in function to impact ADL tasks.    Baseline score of 30 at eval; 08/17/21: FOTO: 42; 09/04/21: FOTO : 54 , FOTO 7/7:  60; 12/25/21: 59; 03/12/22: 66; 04/16/22: 67; 04/25/22; 51; 05/21/22: 74; 07/04/22: 74; 07/18/22: 71   Time 12    Period Weeks    Status  MET/ongoing    Target Date 10/10/22     OT LONG TERM GOAL #7   Title Pt will demonstrate ability to pick up small objects with L hand and complete 9 hole peg test in less than 1 min (revised)   Baseline unable to perform at eval, 10th visit: still unable to pick up small pegs, 20th: unable to pick up pegs but improving; 08/17/21: not attempted d/t time constraints, will assess next visit; 09/04/21: 9 hole peg test in 5 min 53 sec;  7/17:  Pt unable to complete this date but was able to place 6 of 9 pegs in 4 mins 20 secs; 12/25/21: L 1 min 43 sec; 03/12/22: L 1 min 2 sec (requires non-skid surface to pick up small items from table top); 04/16/22: 5 pegs in 2 min 36 secs; 04/25/22: 1 min 48 sec; 05/21/22: L 1 min 23 sec; 07/04/22: 1 min 14 sec    Time 12    Period Weeks    Status Revised/On-going    Target Date 10/10/22    OT LONG TERM GOAL #8  Title Pt will increase LUE strength by 1 MM grade in order to hold blow dryer in LUE to dry hair without dropping dryer.   Baseline 04/25/22: Unable to sustain grip and maintain lift of LUE with hand near head without dropping dryer.  Dryer has caused bruising above L eye with previous attempts.  L shoulder flex/abd 3/5, elbow flex/ext 3+/5; 07/04/22: L shoulder flex/abd 3+, elbow and wrist flex/ext 4+    Time 12    Period Weeks    Status  New/ ongoing   Target Date 10/10/22        OT LONG TERM GOAL #9   Title Pt will increase L hand dexterity to enable pt to independently manage clothing fasteners.   Baseline 04/25/22: pt ties shoe laces loosely and requires assist from spouse occasionally.  When typing, pt engages the L hand for ~25% of the task.  Pt avoids wearing jeans d/t inability to manage button or zipper; 07/04/22: no change yet from 04/25/22; 07/18/22: Managed zipper today, but increased time and effort to manage buttons on pants; able to tie shoes loosely with increased time   Time 12    Period Weeks    Status New    Target Date 10/10/22        OT LONG TERM GOAL #10   Title Pt will be able to independently carry a light plate of food or drink in L hand without spilling/dropping to increase efficiency with item transport in the kitchen.   Baseline 04/25/22: Pt requires constant cues to keep cup or plate level while multitasking in order to prevent spilling; 07/04/22: Pt reports she is carrying her coffee cup from kitchen to neighboring room (not yet consistent to avoid spills)    Time 12    Period Weeks    Status New/ongoing   Target Date 10/10/22     Plan -     Clinical Impression Statement LUE stereognosis assessed; pt accurately able to identify 2 of 7 items in L hand.  Continued education provided to reinforce importance of need for visual attention to LUE to hold objects level in L hand.  Pt reported that she is increasing her awareness on looking at hand and was able to carry a cup full of coffee from countertop to table in the L hand and stated that she kept her eyes on her cup the entire time and was able to manage this without spilling.  Participated in proprioceptive/spatial awareness activities for the LUE with pt practicing positional changes of a dumbbell in hand with eyes closed.  Pt also practiced holding a plate in L hand and making positional changes to keep plate level with eyes closed.  Reinforced importance  of avoiding occluded vision at home, but beneficial to perform with therapist to prevent dropping.  Pt was able to make minor adjustments to level plate in L hand with vision occluded today while sitting, but less capable with dual tasking when walking or having conversation.   Pt will continue to work towards goals in plan of care to improve left UE functional use in ADL and IADL tasks at home and in the community.    OT Occupational Profile and History Detailed Assessment- Review of Records and additional review of physical, cognitive, psychosocial history related to current functional performance    Occupational performance deficits (Please refer to evaluation for details): ADL's;IADL's;Leisure;Rest and Sleep    Body Structure / Function / Physical Skills ADL;Coordination;Endurance;GMC;UE functional use;Balance;IADL;Pain;Dexterity;FMC;Strength;Edema;Mobility;ROM    Psychosocial Skills Environmental  Adaptations;Habits;Routines and Behaviors    Rehab Potential Good    Clinical Decision Making Several treatment options, min-mod task modification necessary   Comorbidities Affecting Occupational Performance: May have comorbidities impacting occupational performance    Modification or Assistance to Complete Evaluation  Min-Moderate modification of tasks or assist with assess necessary to complete eval    OT Frequency 2x / week    OT Duration 12 weeks    OT Treatment/Interventions Self-care/ADL training;Cryotherapy;Paraffin;Therapeutic exercise;DME and/or AE instruction;Functional Mobility Training;Balance training;Electrical Stimulation;Ultrasound;Neuromuscular education;Manual Therapy;Splinting;Moist Heat;Contrast Bath;Passive range of motion;Therapeutic activities;Patient/family education;Coping strategies training    Plan OT recert    Consulted and Agree with Plan of Care Patient           Danelle Earthly, MS, OTR/L  [employer] D[employer], OT 07/26/2022, 9:41 AM

## 2022-07-29 ENCOUNTER — Ambulatory Visit
Admission: RE | Admit: 2022-07-29 | Discharge: 2022-07-29 | Disposition: A | Discharge status: Home / Self Care | Payer: Medicare HMO | Source: Ambulatory Visit | Attending: Internal Medicine | Admitting: Internal Medicine

## 2022-07-29 DIAGNOSIS — Z1231 Encounter for screening mammogram for malignant neoplasm of breast: Secondary | ICD-10-CM | POA: Insufficient documentation

## 2022-07-29 NOTE — Therapy (Signed)
OUTPATIENT PHYSICAL THERAPY NEURO TREATMENT   Patient Name: Teresa [name] MRN: [id number] DOB:[birth year], 82 y.o., female Today's Date: 07/30/2022   PCP: Dr. Aram Beecham REFERRING PROVIDER: Dr. Aram Beecham  END OF SESSION:  PT End of Session - 07/30/22 1259     Visit Number 16    Number of Visits 24    Date for PT Re-Evaluation 08/15/22    Authorization Type [employer] PPO-    Authorization Time Period 05/23/2022- 08/15/2022    Progress Note Due on Visit 10    PT Start Time 1346    PT Stop Time 1427    PT Time Calculation (min) 41 min    Equipment Utilized During Treatment Gait belt    Activity Tolerance Patient tolerated treatment well;No increased pain    Behavior During Therapy WFL for tasks assessed/performed                      Past Medical History:  Diagnosis Date   Cancer (HCC)    skin   Hypothyroidism    Raynaud's disease    Stroke [facility])    Past Surgical History:  Procedure Laterality Date   ABDOMINAL HYSTERECTOMY  1991   APPENDECTOMY  1991   BACK SURGERY  2010   Cervical fusion C4-5-6-7   CATARACT EXTRACTION, BILATERAL Bilateral 10/2016   CHOLECYSTECTOMY  1995   COLONOSCOPY WITH PROPOFOL N/A 12/08/2018   Procedure: COLONOSCOPY WITH PROPOFOL;  Surgeon: Toledo, Boykin Nearing, MD;  Location: [facility] ENDOSCOPY;  Service: Gastroenterology;  Laterality: N/A;   EYE SURGERY     Patient Active Problem List   Diagnosis Date Noted   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   Stroke (HCC) 02/27/2021   History of nonmelanoma skin cancer 05/23/2014    ONSET DATE: 04/16/2022  REFERRING DIAG: 69.354 (ICD-10-CM) - Hemiplegia and hemiparesis following cerebral infarction affecting left non-dominant side   THERAPY DIAG:  Muscle weakness (generalized)  Unsteadiness on feet  Difficulty in walking, not elsewhere classified  Rationale for Evaluation and Treatment: Rehabilitation  SUBJECTIVE:                                                                                                                                                                                              SUBJECTIVE STATEMENT: Patient leaves for her trip this week.    Pt accompanied by: significant other  PERTINENT HISTORY: Pt is a 82 y.o. female with referral for hemiplegia with left sided weakness -original CVA on 02/26/21 and new onset of left sided weakness on 04/16/2022.  R MCA CVA on 02/26/21. Pt completed outpatient PT  on 01/17/2022 with majority of goals met. PMH includes: skin cancer, hypothyroidism, and Raynaud's disease, history of cervical fusion in 2010 C4-C7.   PAIN:  Are you having pain? No  PRECAUTIONS: Fall  WEIGHT BEARING RESTRICTIONS: No  FALLS: Has patient fallen in last 6 months? No  LIVING ENVIRONMENT: Lives with: lives with their spouse Lives in: House/apartment Stairs: Yes: External: 1 steps; none Has following equipment at home: Single point cane, Walker - 2 wheeled, shower chair, and Grab bars  PLOF: Independent  PATIENT GOALS: I want to walk without this cane and no falls and enjoy going out to grandkids sporting event  OBJECTIVE:   DIAGNOSTIC FINDINGS: CLINICAL DATA:  Stroke suspected   EXAM: MRI HEAD WITHOUT CONTRAST   TECHNIQUE: Multiplanar, multiecho pulse sequences of the brain and surrounding structures were obtained without intravenous contrast.   COMPARISON:  MRI Head 02/26/21, CT Head 04/16/22   FINDINGS: Brain: Evolving right MCA territory infarct involving the right frontal operculum in the right insular cortex. No acute infarction, hemorrhage, hydrocephalus, extra-axial collection or mass lesion. Small focus of microhemorrhage in the left temporal lobe. There is petechial hemorrhage and/or hemosiderosis in the region of prior right MCA territory infarct. T2/stir hyperintense signal in the right cerebral peduncle is favored to represent transneuronal degeneration.   Vascular: Major flow  voids are preserved.   Skull and upper cervical spine: Normal marrow signal.   Sinuses/Orbits: Bilateral lens replacement. No mastoid or middle ear effusion. Paranasal sinuses are clear.   Other: None.   IMPRESSION: 1. No acute intracranial abnormality. 2. 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 3.     Electronically Signed   By: Lorenza Cambridge M.D.   On: 04/16/2022 13:41  COGNITION: Overall cognitive status: Within functional limits for tasks assessed   SENSATION: WFL  COORDINATION: Intact with each LE  EDEMA:  None observed  MUSCLE TONE: LLE: Within functional limits  DTRs:  Patella 2+ = Normal and Achilles 2+ = Normal  POSTURE: rounded shoulders and forward head  LOWER EXTREMITY ROM:     Active  Right Eval Left Eval  Hip flexion WNL THROUGHOUT LE WNL THROUGHOUT LE  Hip extension    Hip abduction    Hip adduction    Hip internal rotation    Hip external rotation    Knee flexion    Knee extension    Ankle dorsiflexion    Ankle plantarflexion    Ankle inversion    Ankle eversion     (Blank rows = not tested)  LOWER EXTREMITY MMT:    MMT Right Eval Left Eval  Hip flexion 4+ 4  Hip extension 4+ 4  Hip abduction 4+ 4  Hip adduction 4+ 4  Hip internal rotation 4+ 4  Hip external rotation 4+ 4  Knee flexion 4+ 4  Knee extension 5 4  Ankle dorsiflexion 5 4  Ankle plantarflexion    Ankle inversion    Ankle eversion    (Blank rows = not tested)  BED MOBILITY:  Patient reports independent with all bed mobility and states has returned to master bed  TRANSFERS: Assistive device utilized: None  Sit to stand: Complete Independence Stand to sit: Complete Independence Chair to chair: Complete Independence Floor:  Not tested   GAIT: Gait pattern: step through pattern, decreased arm swing- Right, decreased arm swing- Left, decreased step length- Right, and decreased step length-  Left Distance walked: 915 feet Assistive device utilized: None Level  of assistance: SBA Comments: occasional scuff or decreased left foot clearance  FUNCTIONAL TESTS:  5 times sit to stand: 23.62 sec without UE support Timed up and go (TUG): 20.14 sec without an AD 10 meter walk test: 0.70 m/s  PATIENT SURVEYS:  FOTO 60/ goal of 66  TODAY'S TREATMENT:                                                                                                                              DATE: 07/30/22  Gait belt donned and CGA provided unless otherwise specified     Neuro Re-Ed: at support surface  Incline static stand 30 seconds x 2 trials    ambulate across stable and unstable surface outside. Negotiating changing surfaces from grass to sidewalk, across brick with turns and obstacles in pathway without LOB. Cues for foot clearance.   Speed ladder:  -one foot each square 4x length  -lateral step 4x length   ambulate in hallway: -horizontal head turns with cues for reading alphabet from cards 86 ftx 2 sets -horizontal head turns with cues for reading number and symbols from cards 86 ft x 2 sets  -"red light/green light" for sudden initiation/termination of ambulation with close CGA for carryover to natural environment 2x 86 ft   Seated on dynadisc: -static sit 30 seconds or midline -lateral weight shift 10x each side   Therex:  TrA activation pressing into swiss ball for core activation 15x 5 second holds TrA activation with UE raise 10x each UE   Seated: RTB eversion 15x RTB March with df 10x each LE; cues for widened BOS.   10x STS  PATIENT EDUCATION: Education details: Pt educated throughout session about proper posture and technique with exercises. Improved exercise technique, movement at target joints, use of target muscles after min to mod verbal, visual, tactile cues.  Person educated: Patient Education method: Explanation, Demonstration, Tactile cues, and Verbal  cues Education comprehension: verbalized understanding, returned demonstration, verbal cues required, tactile cues required, and needs further education  HOME EXERCISE PROGRAM: To be updated next 1-2 visit  GOALS: Goals reviewed with patient? Yes  SHORT TERM GOALS: Target date: 07/04/2022  Patient will be independent in home exercise program to improve strength/mobility for better functional independence with ADLs  Baseline: EVAL: Patient reports mostly just walking; 07/09/22: Pt reports her exercises are going well, to be updated future date Goal status: IN PROGRESS  LONG TERM GOALS: Target date: 08/14/2021  Pt will decrease 5TSTS by at least 6 seconds in order to demonstrate clinically significant improvement in LE strength. Baseline: EVAL= 23.62 sec without UE; 07/09/22:  13 seconds hands-free  Goal status: MET  2. Pt will improve FOTO to target score of 66 to display perceived improvements in ability to complete ADL's.  Baseline: Eval= 60; 07/09/22: 65 Goal status: PARTIALLY MET  3.  Pt will decrease TUG to below 14 seconds/decrease in order to demonstrate decreased fall risk. Baseline: Eval= 20.14 sec  without AD; 07/09/22: 11 seconds no AD Goal status: MET  4.  Patient will increase six minute walk test distance to >1200 ft for improve gait ability and return to PLOF to complete her walks in her neighborhood.  Baseline: EVAL= 915 feet without an AD; 07/09/22: 1184 ft without AD Goal status: Partially MET  5. Pt will increase by at least 0.13 m/s in order to demonstrate clinically significant improvement in community ambulation.    Baseline: Eval = 0.70 m/s; 07/09/2022: 1.04 m/s without AD   Goal status: MET   6. Pt will report that she has not hit LLE/LUE on doorframe/walls/furniture while ambulating in the past two weeks to indicate improved ability to scan environment and to decrease injury risk with gait.  Baseline: currently has hit furniture/doorframe 2x/week  Goal  status: NEW   ASSESSMENT:  CLINICAL IMPRESSION: Patient presents with excellent motivation. Is heading to new [place] for two weeks. She has one LOB with head turns with ambulation. Patient educated on exercises to perform on plane: heel toe, LAQ, and march. The pt will benefit from skilled PT services to improve her overall left LE muscle strength and improve her mobility to decrease her fall risk and improve her overall quality of life.   OBJECTIVE IMPAIRMENTS: Abnormal gait, decreased activity tolerance, decreased balance, decreased coordination, decreased endurance, decreased mobility, difficulty walking, and decreased strength.   ACTIVITY LIMITATIONS: carrying, lifting, bending, standing, squatting, stairs, and transfers  PARTICIPATION LIMITATIONS: cleaning, laundry, driving, shopping, community activity, and yard work  PERSONAL FACTORS: 3+ comorbidities: OA, HTN, hx of previous CVA  are also affecting patient's functional outcome.   REHAB POTENTIAL: Good  CLINICAL DECISION MAKING: Evolving/moderate complexity  EVALUATION COMPLEXITY: Moderate  PLAN:  PT FREQUENCY: 1-2x/week  PT DURATION: 12 weeks  PLANNED INTERVENTIONS: Therapeutic exercises, Therapeutic activity, Neuromuscular re-education, Balance training, Gait training, Patient/Family education, Self Care, Joint mobilization, Joint manipulation, Stair training, Vestibular training, Canalith repositioning, DME instructions, Dry Needling, Electrical stimulation, Spinal manipulation, Spinal mobilization, Cryotherapy, Moist heat, Taping, and Manual therapy  PLAN FOR NEXT SESSION: Progressive balance training and LE strengthening and add to HEP as appropriate next session.   Precious Bard PT  Physical Therapist- [facility]  07/30/2022, 2:27 PM

## 2022-07-30 ENCOUNTER — Ambulatory Visit: Payer: Medicare HMO

## 2022-07-30 DIAGNOSIS — R278 Other lack of coordination: Secondary | ICD-10-CM

## 2022-07-30 DIAGNOSIS — R2681 Unsteadiness on feet: Secondary | ICD-10-CM | POA: Diagnosis not present

## 2022-07-30 DIAGNOSIS — M79604 Pain in right leg: Secondary | ICD-10-CM | POA: Diagnosis not present

## 2022-07-30 DIAGNOSIS — R482 Apraxia: Secondary | ICD-10-CM | POA: Diagnosis not present

## 2022-07-30 DIAGNOSIS — M6281 Muscle weakness (generalized): Secondary | ICD-10-CM | POA: Diagnosis not present

## 2022-07-30 DIAGNOSIS — I63511 Cerebral infarction due to unspecified occlusion or stenosis of right middle cerebral artery: Secondary | ICD-10-CM | POA: Diagnosis not present

## 2022-07-30 DIAGNOSIS — R2689 Other abnormalities of gait and mobility: Secondary | ICD-10-CM | POA: Diagnosis not present

## 2022-07-30 DIAGNOSIS — R262 Difficulty in walking, not elsewhere classified: Secondary | ICD-10-CM

## 2022-07-30 NOTE — Therapy (Signed)
OUTPATIENT OCCUPATIONAL THERAPY NEURO TREATMENT NOTE          Patient Name: Teresa [name] MRN: [id number] DOB:Oct 30, 1940, 82 y.o., female Today's Date: 05/23/2022  PCP: Dr. Aram Beecham REFERRING PROVIDER: Dr. Aram Beecham   OT End of Session - 07/30/22 1630     Visit Number [id number]    Number of Visits 138    Date for OT Re-Evaluation 10/10/22    Authorization Time Period Reporting period beginning 07/04/22    Progress Note Due on Visit 10    OT Start Time 1300    OT Stop Time 1345    OT Time Calculation (min) 45 min    Equipment Utilized During Treatment cane    Activity Tolerance Patient tolerated treatment well    Behavior During Therapy WFL for tasks assessed/performed            Past Medical History:  Diagnosis Date   Cancer (HCC)    skin   Hypothyroidism    Raynaud's disease    Stroke [facility])    Past Surgical History:  Procedure Laterality Date   ABDOMINAL HYSTERECTOMY  1991   APPENDECTOMY  1991   BACK SURGERY  2010   Cervical fusion C4-5-6-7   CATARACT EXTRACTION, BILATERAL Bilateral 10/2016   CHOLECYSTECTOMY  1995   COLONOSCOPY WITH PROPOFOL N/A 12/08/2018   Procedure: COLONOSCOPY WITH PROPOFOL;  Surgeon: Toledo, Boykin Nearing, MD;  Location: [facility] ENDOSCOPY;  Service: Gastroenterology;  Laterality: N/A;   EYE SURGERY     Patient Active Problem List   Diagnosis Date Noted   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  Following  inpt rehab, pt went home and had home health.   PRECAUTIONS: fall  SUBJECTIVE: Pt reports doing well today and looking forward to her trip back to [place] to see family on Thurs this week.  PAIN:  Are you having pain? none  OBJECTIVE:  L grip 6, R grip 41 L lateral pinch 5, R 13 L 3 point pinch 4, R 14  L 9 hole 1 min 43 sec. L shoulder active flexion 0-105, passive 0-125 02/05/22: L grip 13#  L lateral pinch 8#  3 point pinch 5#  L shoulder flex 0-108 03/12/22:  L grip: 12# L lateral pinch: 8# 3 point pinch: 6# L 9 hole: 1 min 3 sec L shoulder flexion 0-110 04/16/22: L grip: 3# L lateral pinch: 4# 3 point pinch: difficulty maintaining 3 point pinch  L 9 hole: 5 pegs in 2 min 36 sec  L shoulder flexion 0-107 BP L 132/79 HR 80 02 sats 96% 04/25/22: L grip: 14#; R grip: 50# L lateral 2#, R lateral 17# 3 point pinch: L 4#, R 14# (Saehan pinch gauge) 9 hole peg test: 1 min, 48 sec L shoulder flex 125 (R shoulder 132 active) 05/21/22: L grip: 15#; R grip: 50# L lateral 8#, R lateral 20# 3 point pinch: L 4#, R 12# (Saehan pinch gauge) 9 hole peg test: 1  min, 23 sec 07/04/22: L grip: 19# L lateral 10# 3 point pinch: L 6# (standard pinch gauge-not Saehan) 9 hole peg test: 1 min, 14 sec 07/18/22:  L grip 22# L lateral: 9# L 3 point pinch: 6# L 9 hole: 1 min 8 sec   TODAY'S TREATMENT: Therapeutic Exercise: Facilitated L hand strengthening with use of hand gripper set at 20# to complete 3 sets 10 reps of grip squeezes, min vc to ensure SF was engaged with each gripping rep.    Neuro re-ed: Participated in proprioceptive/spatial awareness activities for the LUE with pt practicing positional changes of a dumbbell in L hand with eyes closed.  Practiced dual tasking while holding a 3# weight in the L hand, while bouncing/tossing/catching a tennis ball in the R hand. Pt practiced holding the weight using a hook grasp to simulate carrying a grocery bag. Also held the weight with  elbow flexed at 90 and neutral forearm to simulate holding a cup. Pt held dumbbell in the L hand with a hook grasp for 3 min, requiring 2 vc to adjust grasp to prevent dropping the weight, and for 1 min 30 sec with several vc to maintain L elbow at 90 degrees to prevent simulated "cup from tipping."   Facilitated L FMC/dexterity skills working to pick up and place small pegs into pegboard.  Pt practiced a variety of pinch patterns, including tip pinch, lateral pinch, and a 3 point pinch to remove pegs from pegboard; 3 point pinch the most challenging to engage the L LF without slipping from thumb and IF.  Pt then practiced removing pegs while storing up to 8-9 in hand before discarding a handful of pegs into container.  Began dropping pegs from ulnar side of palm after 9 pegs.  Pt practiced scooping a palm full of pegs from container, supinating forearm without dropping pegs, then pronating to drop pegs back into container.  PATIENT EDUCATION: Education details: Need for visual attention to LUE to hold objects level in L hand when practicing at home Person educated: pt Education method: explanation, demo, visual and vc Education comprehension: verbalized understanding, demonstrated understanding  HOME EXERCISE PROGRAM Continue to engage LUE into ADLs; continue use of putty for gripping and pinching exercises for LUE, and L shoulder AROM/AAROM, crocheting, typing; increase participation in IADL tasks for greater use of L hand.    OT Short Term Goals - 08/29/22 (6 weeks)      OT SHORT TERM GOAL #1   Title Pt will be independent with home exercise program.    Baseline Eval: no current program, 10th visit:  continue to add new exercises as pt progresses, 20th:  continue to update HEP; 08/17/21: continue to progress HEP when indicated.  Visit 40: adding new exercises as pt progresses; 12/25/21: ongoing with progressions; 03/12/22: inconsistent use of putty but regular use of stress ball (every other day).   Encouraged pt return to daily putty use (2x daily when able) to target grip and pinch strengthening; 04/16/22: not reviewed today d/t OT sent pt to ED; 04/25/22: added pulleys and pt has re-started putty; 05/21/22: stopped pulleys and added stronger theraputty and encouraged pt crochet often; 07/04/22: pt working consistently with putty and grip strengthener at 20# at home; 07/18/22: pt continues to work on putty and Building surveyor and will be starting with her crocheting again to progress [facility] skills   Time 12    Period Weeks    Status On-going    Target Date 08/29/22  OT LONG TERM GOAL #2   Title Pt will complete UB and LB dressing with modified independence including buttons, snaps and zippers.    Baseline requires min assist at eval, 10th visit: occasional assist with buttons, 20th:  able to perform one handed, but difficulty with bilateral UE; 08/17/21: pt reports inconsistent with 1 hand, reviewed techniques this visit, Visit 40:  Pt requires assist with bra   Time 12    Period Weeks    Status MET   Target Date 11/08/21     OT LONG TERM GOAL #3   Title Pt will perform shower transfer with modified independence.    Baseline Pt requires supervision to min assist for shower transfer at home. 10th visit: supervision; 08/17/21: supv .  Visit 40:  Pt had met goal but had recent fall and now requiring min guard to supv   Time 6    Period Weeks    Status  Met    Target Date 11/08/21      OT LONG TERM GOAL #4 10/10/22 (12 weeks)    Title Pt will improve L hand grip to 25 or more #s to enable pt to open a new jar.   Baseline no grip in left hand at eval, 10th visit:  improved flexion but still working towards composite fisting and grip. 20th:  continues to demo decreased grip; 08/17/21: active digit flexion improving, but not yet able to register grip on dynamometer; 09/04/21: L grip 1# 7/17:  5#; 12/25/21: L grip 6#; 02/05/22: 13#; 03/12/22: L grip 12#; 1/23//24: L grip 3# (decreased); 04/25/22: L grip  14#; spouse assists to open new jars; 05/21/22: L grip 15#; 07/04/22: L grip 19#; 07/18/22: L grip 22#, difficulty opening a new jar (R grip 50#)   Time 12    Period Weeks    Status Revised/ongoing   Target Date 10/10/22     OT LONG TERM GOAL #5   Title Pt will improve left shoulder flexion to 100 degrees or better to improve reaching to obtain self care items from shelf/shoulder height.    Baseline difficulty with reach, shoulder flexion to 47 degrees; 08/17/21: L shoulder flexion 85, but not yet able to consistently hold ADL supplies in L hand when reaching; 09/04/21: flexion 85 7/17:  shoulder flexion to 90; 12/25/21: 105 (P 125); 03/12/22: active L shoulder flexion 110 (pt uses R arm to reach for items at shoulder height or above); 04/16/22: L shoulder flexion 107; 04/25/22: L shoulder flexion 125*, R 132*    Time 12    Period Weeks    Status achieved   Target Date 04/25/22     OT LONG TERM GOAL #6   Title Pt will improve FOTO score to 47 or above to demonstrate a clinically relevant change in function to impact ADL tasks.    Baseline score of 30 at eval; 08/17/21: FOTO: 42; 09/04/21: FOTO : 54 , FOTO 7/7:  60; 12/25/21: 59; 03/12/22: 66; 04/16/22: 67; 04/25/22; 51; 05/21/22: 74; 07/04/22: 74; 07/18/22: 71   Time 12    Period Weeks    Status  MET/ongoing    Target Date 10/10/22     OT LONG TERM GOAL #7   Title Pt will demonstrate ability to pick up small objects with L hand and complete 9 hole peg test in less than 1 min (revised)   Baseline unable to perform at eval, 10th visit: still unable to pick up small pegs, 20th: unable to pick up pegs but improving;  08/17/21: not attempted d/t time constraints, will assess next visit; 09/04/21: 9 hole peg test in 5 min 53 sec;  7/17:  Pt unable to complete this date but was able to place 6 of 9 pegs in 4 mins 20 secs; 12/25/21: L 1 min 43 sec; 03/12/22: L 1 min 2 sec (requires non-skid surface to pick up small items from table top); 04/16/22: 5 pegs in 2 min 36 secs;  04/25/22: 1 min 48 sec; 05/21/22: L 1 min 23 sec; 07/04/22: 1 min 14 sec    Time 12    Period Weeks    Status Revised/On-going    Target Date 10/10/22    OT LONG TERM GOAL #8   Title Pt will increase LUE strength by 1 MM grade in order to hold blow dryer in LUE to dry hair without dropping dryer.   Baseline 04/25/22: Unable to sustain grip and maintain lift of LUE with hand near head without dropping dryer.  Dryer has caused bruising above L eye with previous attempts.  L shoulder flex/abd 3/5, elbow flex/ext 3+/5; 07/04/22: L shoulder flex/abd 3+, elbow and wrist flex/ext 4+    Time 12    Period Weeks    Status New/ ongoing   Target Date 10/10/22        OT LONG TERM GOAL #9   Title Pt will increase L hand dexterity to enable pt to independently manage clothing fasteners.   Baseline 04/25/22: pt ties shoe laces loosely and requires assist from spouse occasionally.  When typing, pt engages the L hand for ~25% of the task.  Pt avoids wearing jeans d/t inability to manage button or zipper; 07/04/22: no change yet from 04/25/22; 07/18/22: Managed zipper today, but increased time and effort to manage buttons on pants; able to tie shoes loosely with increased time   Time 12    Period Weeks    Status New    Target Date 10/10/22        OT LONG TERM GOAL #10   Title Pt will be able to independently carry a light plate of food or drink in L hand without spilling/dropping to increase efficiency with item transport in the kitchen.   Baseline 04/25/22: Pt requires constant cues to keep cup or plate level while multitasking in order to prevent spilling; 07/04/22: Pt reports she is carrying her coffee cup from kitchen to neighboring room (not yet consistent to avoid spills)    Time 12    Period Weeks    Status New/ongoing   Target Date 10/10/22     Plan -     Clinical Impression Statement Continued focus on proprioceptive/spatial awareness activities for the LUE with pt practicing positional changes of a dumbbell  in hand with eyes closed.  Pt was able to make minor adjustments to level the dumbbell in L hand with vision occluded today while sitting, but less capable when dual tasking.  While bouncing/catching a tennis ball in the R hand, pt held dumbbell in the L hand with a hook grasp for 3 min, requiring 2 vc to adjust grasp to prevent dropping the weight.  Continued dual tasking with tennis ball in R hand, with dumbbell repositioned to simulate holding a cup level with L elbow at 90 degrees and neutral forearm, pt held dumbbell for 1 min 30 sec, requiring several vc to maintain L elbow at 90 degrees to prevent simulated "cup from tipping." Pt will continue to work towards goals in plan of care to improve  left UE functional use in ADL and IADL tasks at home and in the community.    OT Occupational Profile and History Detailed Assessment- Review of Records and additional review of physical, cognitive, psychosocial history related to current functional performance    Occupational performance deficits (Please refer to evaluation for details): ADL's;IADL's;Leisure;Rest and Sleep    Body Structure / Function / Physical Skills ADL;Coordination;Endurance;GMC;UE functional use;Balance;IADL;Pain;Dexterity;FMC;Strength;Edema;Mobility;ROM    Psychosocial Skills Environmental  Adaptations;Habits;Routines and Behaviors    Rehab Potential Good    Clinical Decision Making Several treatment options, min-mod task modification necessary   Comorbidities Affecting Occupational Performance: May have comorbidities impacting occupational performance    Modification or Assistance to Complete Evaluation  Min-Moderate modification of tasks or assist with assess necessary to complete eval    OT Frequency 2x / week    OT Duration 12 weeks    OT Treatment/Interventions Self-care/ADL training;Cryotherapy;Paraffin;Therapeutic exercise;DME and/or AE instruction;Functional Mobility Training;Balance training;Electrical  Stimulation;Ultrasound;Neuromuscular education;Manual Therapy;Splinting;Moist Heat;Contrast Bath;Passive range of motion;Therapeutic activities;Patient/family education;Coping strategies training    Plan OT recert    Consulted and Agree with Plan of Care Patient           Danelle Earthly, MS, OTR/L  Otis Dials, OT 07/30/2022, 4:32 PM

## 2022-07-31 ENCOUNTER — Ambulatory Visit: Payer: Medicare PPO

## 2022-07-31 ENCOUNTER — Telehealth: Payer: Self-pay | Admitting: *Deleted

## 2022-07-31 ENCOUNTER — Ambulatory Visit: Payer: Medicare HMO | Admitting: Occupational Therapy

## 2022-07-31 NOTE — Telephone Encounter (Signed)
Pre-op team,   This patient is not followed by our practice. She was only seen to have loop recorder implanted.   Thank you!  DW

## 2022-07-31 NOTE — Telephone Encounter (Signed)
   Pre-operative Risk Assessment    Patient Name: Teresa [name]  DOB: 17-Nov-1940 MRN: [id number]     Request for Surgical Clearance    Procedure:  Dental Extraction - Amount of Teeth to be Pulled:  JUST 1 TOOTH  Date of Surgery:  Clearance TBD                                 Surgeon:  Marian Sorrow, DDS Surgeon's Group or Practice Name:  [facility] Phone number:  [phone] Fax number:  [phone]   Type of Clearance Requested:   - Pharmacy:  Hold Clopidogrel (Plavix) THEIR QUESTION IS DOES PT HAVE TO HOLD FOR 1 TOOTH?   Type of Anesthesia:  Not Indicated   Additional requests/questions:   PT HAS [employer] ILR, DO WE NEED TO SEND TO DEVICE AS WELL?  Signed, Elliot Cousin   07/31/2022, 4:16 PM

## 2022-08-01 ENCOUNTER — Ambulatory Visit: Payer: Medicare PPO

## 2022-08-01 ENCOUNTER — Ambulatory Visit: Payer: Medicare HMO

## 2022-08-01 LAB — CUP PACEART REMOTE DEVICE CHECK
Date Time Interrogation Session: 20240508231009
Implantable Pulse Generator Implant Date: 19440924

## 2022-08-01 NOTE — Telephone Encounter (Signed)
Patient has ILR. No clearance indicated for this device.

## 2022-08-05 ENCOUNTER — Ambulatory Visit: Payer: Medicare HMO

## 2022-08-05 DIAGNOSIS — I639 Cerebral infarction, unspecified: Secondary | ICD-10-CM | POA: Diagnosis not present

## 2022-08-06 ENCOUNTER — Ambulatory Visit: Payer: Medicare HMO

## 2022-08-07 NOTE — Progress Notes (Signed)
Carelink Summary Report / Loop Recorder 

## 2022-08-08 ENCOUNTER — Ambulatory Visit: Payer: Medicare HMO

## 2022-08-09 ENCOUNTER — Ambulatory Visit: Payer: Medicare PPO

## 2022-08-09 ENCOUNTER — Ambulatory Visit: Payer: Medicare HMO

## 2022-08-12 ENCOUNTER — Ambulatory Visit: Payer: Medicare HMO

## 2022-08-12 ENCOUNTER — Ambulatory Visit: Payer: Medicare PPO

## 2022-08-12 NOTE — Therapy (Incomplete)
OUTPATIENT PHYSICAL THERAPY NEURO TREATMENT/RECERT   Patient Name: Teresa [name] MRN: [id number] DOB:[birth year], 82 y.o., female Today's Date: 08/13/2022   PCP: Dr. Aram Beecham REFERRING PROVIDER: Dr. Aram Beecham  END OF SESSION:  PT End of Session - 08/13/22 1346     Visit Number 17    Number of Visits 41    Date for PT Re-Evaluation 11/05/22    Authorization Type [employer] PPO-    Authorization Time Period 05/23/2022- 08/15/2022    Progress Note Due on Visit 10    PT Start Time 1348    PT Stop Time 1429    PT Time Calculation (min) 41 min    Equipment Utilized During Treatment Gait belt    Activity Tolerance Patient tolerated treatment well;No increased pain    Behavior During Therapy WFL for tasks assessed/performed                       Past Medical History:  Diagnosis Date   Cancer (HCC)    skin   Hypothyroidism    Raynaud's disease    Stroke [facility])    Past Surgical History:  Procedure Laterality Date   ABDOMINAL HYSTERECTOMY  1991   APPENDECTOMY  1991   BACK SURGERY  2010   Cervical fusion C4-5-6-7   CATARACT EXTRACTION, BILATERAL Bilateral 10/2016   CHOLECYSTECTOMY  1995   COLONOSCOPY WITH PROPOFOL N/A 12/08/2018   Procedure: COLONOSCOPY WITH PROPOFOL;  Surgeon: Toledo, Boykin Nearing, MD;  Location: [facility] ENDOSCOPY;  Service: Gastroenterology;  Laterality: N/A;   EYE SURGERY     Patient Active Problem List   Diagnosis Date Noted   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   Stroke (HCC) 02/27/2021   History of nonmelanoma skin cancer 05/23/2014    ONSET DATE: 04/16/2022  REFERRING DIAG: 69.354 (ICD-10-CM) - Hemiplegia and hemiparesis following cerebral infarction affecting left non-dominant side   THERAPY DIAG:  Muscle weakness (generalized) - Plan: PT plan of care cert/re-cert  Unsteadiness on feet - Plan: PT plan of care cert/re-cert  Difficulty in walking, not elsewhere classified - Plan: PT plan of care  cert/re-cert  Rationale for Evaluation and Treatment: Rehabilitation  SUBJECTIVE:                                                                                                                                                                                             SUBJECTIVE STATEMENT: Patient has returned from her trip. Patient reports she continues to have issues with her balance.    Pt accompanied by: significant other  PERTINENT HISTORY: Pt is a 82 y.o. female  with referral for hemiplegia with left sided weakness -original CVA on 02/26/21 and new onset of left sided weakness on 04/16/2022.  R MCA CVA on 02/26/21. Pt completed outpatient PT on 01/17/2022 with majority of goals met. PMH includes: skin cancer, hypothyroidism, and Raynaud's disease, history of cervical fusion in 2010 C4-C7.   PAIN:  Are you having pain? No  PRECAUTIONS: Fall  WEIGHT BEARING RESTRICTIONS: No  FALLS: Has patient fallen in last 6 months? No  LIVING ENVIRONMENT: Lives with: lives with their spouse Lives in: House/apartment Stairs: Yes: External: 1 steps; none Has following equipment at home: Single point cane, Walker - 2 wheeled, shower chair, and Grab bars  PLOF: Independent  PATIENT GOALS: I want to walk without this cane and no falls and enjoy going out to grandkids sporting event  OBJECTIVE:   DIAGNOSTIC FINDINGS: CLINICAL DATA:  Stroke suspected   EXAM: MRI HEAD WITHOUT CONTRAST   TECHNIQUE: Multiplanar, multiecho pulse sequences of the brain and surrounding structures were obtained without intravenous contrast.   COMPARISON:  MRI Head 02/26/21, CT Head 04/16/22   FINDINGS: Brain: Evolving right MCA territory infarct involving the right frontal operculum in the right insular cortex. No acute infarction, hemorrhage, hydrocephalus, extra-axial collection or mass lesion. Small focus of microhemorrhage in the left temporal lobe. There is petechial hemorrhage and/or hemosiderosis in the  region of prior right MCA territory infarct. T2/stir hyperintense signal in the right cerebral peduncle is favored to represent transneuronal degeneration.   Vascular: Major flow voids are preserved.   Skull and upper cervical spine: Normal marrow signal.   Sinuses/Orbits: Bilateral lens replacement. No mastoid or middle ear effusion. Paranasal sinuses are clear.   Other: None.   IMPRESSION: 1. No acute intracranial abnormality. 2. 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 3.     Electronically Signed   By: Lorenza Cambridge M.D.   On: 04/16/2022 13:41  COGNITION: Overall cognitive status: Within functional limits for tasks assessed   SENSATION: WFL  COORDINATION: Intact with each LE  EDEMA:  None observed  MUSCLE TONE: LLE: Within functional limits  DTRs:  Patella 2+ = Normal and Achilles 2+ = Normal  POSTURE: rounded shoulders and forward head  LOWER EXTREMITY ROM:     Active  Right Eval Left Eval  Hip flexion WNL THROUGHOUT LE WNL THROUGHOUT LE  Hip extension    Hip abduction    Hip adduction    Hip internal rotation    Hip external rotation    Knee flexion    Knee extension    Ankle dorsiflexion    Ankle plantarflexion    Ankle inversion    Ankle eversion     (Blank rows = not tested)  LOWER EXTREMITY MMT:    MMT Right Eval Left Eval  Hip flexion 4+ 4  Hip extension 4+ 4  Hip abduction 4+ 4  Hip adduction 4+ 4  Hip internal rotation 4+ 4  Hip external rotation 4+ 4  Knee flexion 4+ 4  Knee extension 5 4  Ankle dorsiflexion 5 4  Ankle plantarflexion    Ankle inversion    Ankle eversion    (Blank rows = not tested)  BED MOBILITY:  Patient reports independent with all bed mobility and states has returned to master bed  TRANSFERS: Assistive device utilized: None  Sit to stand: Complete Independence Stand to sit: Complete Independence Chair to chair: Complete  Independence Floor:  Not tested   GAIT:  Gait pattern: step through pattern, decreased arm swing- Right, decreased arm swing- Left, decreased step length- Right, and decreased step length- Left Distance walked: 915 feet Assistive device utilized: None Level of assistance: SBA Comments: occasional scuff or decreased left foot clearance  FUNCTIONAL TESTS:  5 times sit to stand: 23.62 sec without UE support Timed up and go (TUG): 20.14 sec without an AD 10 meter walk test: 0.70 m/s  PATIENT SURVEYS:  FOTO 60/ goal of 66  TODAY'S TREATMENT:                                                                                                                              DATE: 08/13/22    Goals performed: see below for details   Gait belt donned and CGA provided unless otherwise specified     Neuro Re-Ed: at support surface  Incline static stand 30 seconds x 2 trials   Airex pad dual task with word game x6 minutes  ambulate in hallway: -horizontal head turns with cues for reading alphabet from cards 86 ftx 2 sets   Therex:  TrA activation pressing into swiss ball for core activation 15x 5 second holds TrA activation with UE raise 10x each UE    PATIENT EDUCATION: Education details: Pt educated throughout session about proper posture and technique with exercises. Improved exercise technique, movement at target joints, use of target muscles after min to mod verbal, visual, tactile cues.  Person educated: Patient Education method: Explanation, Demonstration, Tactile cues, and Verbal cues Education comprehension: verbalized understanding, returned demonstration, verbal cues required, tactile cues required, and needs further education  HOME EXERCISE PROGRAM: To be updated next 1-2 visit  GOALS: Goals reviewed with patient? Yes  SHORT TERM GOALS: Target date: 07/04/2022  Patient will be independent in home exercise program to improve strength/mobility for better functional  independence with ADLs  Baseline: EVAL: Patient reports mostly just walking; 07/09/22: Pt reports her exercises are going well, to be updated future date Goal status: IN PROGRESS  LONG TERM GOALS: Target date: 11/05/2022    Pt will decrease 5TSTS by at least 6 seconds in order to demonstrate clinically significant improvement in LE strength. Baseline: EVAL= 23.62 sec without UE; 07/09/22:  13 seconds hands-free  Goal status: MET  2. Pt will improve FOTO to target score of 66 to display perceived improvements in ability to complete ADL's.  Baseline: Eval= 60; 07/09/22: 65 5/21: 59% Goal status: PARTIALLY MET  3.  Pt will decrease TUG to below 14 seconds/decrease in order to demonstrate decreased fall risk. Baseline: Eval= 20.14 sec without AD; 07/09/22: 11 seconds no AD Goal status: MET  4.  Patient will increase six minute walk test distance to >1200 ft for improve gait ability and return to PLOF to complete her walks in her neighborhood.  Baseline: EVAL= 915 feet without an AD; 07/09/22: 1184 ft without AD 5/21: 1060 ft  Goal status: Partially MET  5. Pt will increase  by at least 0.13 m/s in order to demonstrate clinically significant improvement in community ambulation.    Baseline: Eval = 0.70 m/s; 07/09/2022: 1.04 m/s without AD   Goal status: MET   6. Pt will report that she has not hit LLE/LUE on doorframe/walls/furniture while ambulating in the past two weeks to indicate improved ability to scan environment and to decrease injury risk with gait.  Baseline: currently has hit furniture/doorframe 2x/week 5/21: hitting doorframe less frequently.   Goal status: Ongoing  7. Patient will increase Functional Gait Assessment score to >20/30 as to reduce fall risk and improve dynamic gait safety with community ambulation.  Baseline:5/21: 13/30  Goal status: NEW   ASSESSMENT:  CLINICAL IMPRESSION: Patient is returning from absence due to trip back home to family. New goals  addressing balance will be added to POC due to deficits found in FGA. She is running into walls less frequently indicating improved spatial awareness. Steady progression of mobility noted with progress notes. Notable L foot drop/drag with ambulation present when patient fatigued during six minute walk test.   The pt will benefit from skilled PT services to improve her overall left LE muscle strength and improve her mobility to decrease her fall risk and improve her overall quality of life.   OBJECTIVE IMPAIRMENTS: Abnormal gait, decreased activity tolerance, decreased balance, decreased coordination, decreased endurance, decreased mobility, difficulty walking, and decreased strength.   ACTIVITY LIMITATIONS: carrying, lifting, bending, standing, squatting, stairs, and transfers  PARTICIPATION LIMITATIONS: cleaning, laundry, driving, shopping, community activity, and yard work  PERSONAL FACTORS: 3+ comorbidities: OA, HTN, hx of previous CVA  are also affecting patient's functional outcome.   REHAB POTENTIAL: Good  CLINICAL DECISION MAKING: Evolving/moderate complexity  EVALUATION COMPLEXITY: Moderate  PLAN:  PT FREQUENCY: 1-2x/week  PT DURATION: 12 weeks  PLANNED INTERVENTIONS: Therapeutic exercises, Therapeutic activity, Neuromuscular re-education, Balance training, Gait training, Patient/Family education, Self Care, Joint mobilization, Joint manipulation, Stair training, Vestibular training, Canalith repositioning, DME instructions, Dry Needling, Electrical stimulation, Spinal manipulation, Spinal mobilization, Cryotherapy, Moist heat, Taping, and Manual therapy  PLAN FOR NEXT SESSION: Progressive balance training and LE strengthening and add to HEP as appropriate next session.   Precious Bard PT  Physical Therapist- [facility]  08/13/2022, 2:29 PM

## 2022-08-13 ENCOUNTER — Ambulatory Visit: Payer: Medicare PPO

## 2022-08-13 ENCOUNTER — Ambulatory Visit: Payer: Medicare HMO

## 2022-08-13 DIAGNOSIS — R262 Difficulty in walking, not elsewhere classified: Secondary | ICD-10-CM

## 2022-08-13 DIAGNOSIS — I63511 Cerebral infarction due to unspecified occlusion or stenosis of right middle cerebral artery: Secondary | ICD-10-CM

## 2022-08-13 DIAGNOSIS — M79604 Pain in right leg: Secondary | ICD-10-CM | POA: Diagnosis not present

## 2022-08-13 DIAGNOSIS — R278 Other lack of coordination: Secondary | ICD-10-CM

## 2022-08-13 DIAGNOSIS — R482 Apraxia: Secondary | ICD-10-CM | POA: Diagnosis not present

## 2022-08-13 DIAGNOSIS — R2681 Unsteadiness on feet: Secondary | ICD-10-CM | POA: Diagnosis not present

## 2022-08-13 DIAGNOSIS — R2689 Other abnormalities of gait and mobility: Secondary | ICD-10-CM | POA: Diagnosis not present

## 2022-08-13 DIAGNOSIS — M6281 Muscle weakness (generalized): Secondary | ICD-10-CM | POA: Diagnosis not present

## 2022-08-13 NOTE — Therapy (Signed)
OUTPATIENT OCCUPATIONAL THERAPY NEURO TREATMENT NOTE          Patient Name: Teresa [name] MRN: [id number] DOB:[birth year], 82 y.o., female Today's Date: 05/23/2022  PCP: Dr. Aram Beecham REFERRING PROVIDER: Dr. Aram Beecham   OT End of Session - 08/13/22 1700     Visit Number [id number]    Number of Visits 138    Date for OT Re-Evaluation 10/10/22    Authorization Time Period Reporting period beginning 07/04/22    Progress Note Due on Visit 10    OT Start Time 1303    OT Stop Time 1345    OT Time Calculation (min) 42 min    Equipment Utilized During Treatment cane    Activity Tolerance Patient tolerated treatment well    Behavior During Therapy WFL for tasks assessed/performed            Past Medical History:  Diagnosis Date   Cancer (HCC)    skin   Hypothyroidism    Raynaud's disease    Stroke [facility])    Past Surgical History:  Procedure Laterality Date   ABDOMINAL HYSTERECTOMY  1991   APPENDECTOMY  1991   BACK SURGERY  2010   Cervical fusion C4-5-6-7   CATARACT EXTRACTION, BILATERAL Bilateral 10/2016   CHOLECYSTECTOMY  1995   COLONOSCOPY WITH PROPOFOL N/A 12/08/2018   Procedure: COLONOSCOPY WITH PROPOFOL;  Surgeon: Toledo, Boykin Nearing, MD;  Location: [facility] ENDOSCOPY;  Service: Gastroenterology;  Laterality: N/A;   EYE SURGERY     Patient Active Problem List   Diagnosis Date Noted   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  Following  inpt rehab, pt went home and had home health.   PRECAUTIONS: fall  SUBJECTIVE: Pt returned after a 10 day trip to visit family in [place].  Pt states she had a great trip visiting with family.  L hand has been more swollen since yesterday.  Anticipate increased swelling from air travel.    PAIN:  Are you having pain? none  OBJECTIVE:  L grip 6, R grip 41 L lateral pinch 5, R 13 L 3 point pinch 4, R 14  L 9 hole 1 min 43 sec. L shoulder active flexion 0-105, passive 0-125 02/05/22: L grip 13#  L lateral pinch 8#  3 point pinch 5#  L shoulder flex 0-108 03/12/22:  L grip: 12# L lateral pinch: 8# 3 point pinch: 6# L 9 hole: 1 min 3 sec L shoulder flexion 0-110 04/16/22: L grip: 3# L lateral pinch: 4# 3 point pinch: difficulty maintaining 3 point pinch  L 9 hole: 5 pegs in 2 min 36 sec  L shoulder flexion 0-107 BP L 132/79 HR 80 02 sats 96% 04/25/22: L grip: 14#; R grip: 50# L lateral 2#, R lateral 17# 3 point pinch: L 4#, R 14# (Saehan pinch gauge) 9 hole peg test: 1 min, 48 sec L shoulder flex 125 (R shoulder 132 active) 05/21/22: L grip: 15#; R grip: 50# L  lateral 8#, R lateral 20# 3 point pinch: L 4#, R 12# (Saehan pinch gauge) 9 hole peg test: 1 min, 23 sec 07/04/22: L grip: 19# L lateral 10# 3 point pinch: L 6# (standard pinch gauge-not Saehan) 9 hole peg test: 1 min, 14 sec 07/18/22:  L grip 22# L lateral: 9# L 3 point pinch: 6# L 9 hole: 1 min 8 sec   TODAY'S TREATMENT: Manual Therapy: Performed edema massage for L hand, including dorsum of the hand and all digits, with increased focus on L ring finger to allow rings to slide on/off PIP joint.  Pt had rings sized up in [place] but were more snug today after flying.  Following edema massage, OT was able to assist pt to slide rings on/off this digit with greater ease.    Therapeutic Exercise: Facilitated hand strengthening with use of hand gripper set at 6.6# to remove jumbo pegs from pegboard x2 trials using L hand.   Pt required cues to perform peg set up with L hand rather than R.  Attempted first set on 11.2#, and pt was able to manage several pegs, but was able to sustain this level of resistance for <25% of the pegs, so OT adjusted setting back down to 6.6#.   Therapeutic Activity: Facilitated L hand FMC/dexterity skills working to thread an earring back onto a clothespin to simulate clasping earrings.  Pt able to manage 1 with vc for L hand positioning and R hand assist.  Pt struggled d/t decreased sensation in L hand digits.  Facilitated additional FMC/dexterity skills working with alphabet dice in L hand, working to reposition dice 1 at a time within fingertips to spell out the letters of first name.  Pt attempted to work with 2 dice at a time, but repeatedly dropped them.  Pt practiced storing up to 5 in hand and discarding 1 at a time.   PATIENT EDUCATION: Education details: Reviewed edema management strategies, including elevation, massage, use of compression glove, and AROM.  Pt verbalized understanding.   Person educated: pt Education method: explanation, demo Education comprehension: verbalized understanding, demonstrated understanding  HOME EXERCISE PROGRAM Continue to engage LUE into ADLs; continue use of putty for gripping and pinching exercises for LUE, and L shoulder AROM/AAROM, crocheting, typing; increase participation in IADL tasks for greater use of L hand.    OT Short Term Goals - 08/29/22 (6 weeks)      OT SHORT TERM GOAL #1   Title Pt will be independent with home exercise program.    Baseline Eval: no current program, 10th visit:  continue to add new exercises as pt progresses, 20th:  continue to update HEP; 08/17/21: continue to progress HEP when indicated.  Visit 40: adding new exercises as pt progresses; 12/25/21: ongoing with progressions; 03/12/22: inconsistent use of putty but regular use of stress ball (every other day).  Encouraged pt return to daily putty use (2x daily when  able) to target grip and pinch strengthening; 04/16/22: not reviewed today d/t OT sent pt to ED; 04/25/22: added pulleys and pt has re-started putty; 05/21/22: stopped pulleys and added stronger theraputty and encouraged pt crochet often; 07/04/22: pt working consistently with putty and grip strengthener at 20# at home; 07/18/22: pt continues to work on putty and Building surveyor and will be starting with her crocheting again to progress [facility] skills   Time 12    Period Weeks    Status On-going    Target Date 08/29/22     OT LONG  TERM GOAL #2   Title Pt will complete UB and LB dressing with modified independence including buttons, snaps and zippers.    Baseline requires min assist at eval, 10th visit: occasional assist with buttons, 20th:  able to perform one handed, but difficulty with bilateral UE; 08/17/21: pt reports inconsistent with 1 hand, reviewed techniques this visit, Visit 40:  Pt requires assist with bra   Time 12    Period Weeks    Status MET   Target Date 11/08/21     OT LONG TERM GOAL #3   Title Pt will perform shower transfer with modified independence.    Baseline Pt requires supervision to min assist for shower transfer at home. 10th visit: supervision; 08/17/21: supv .  Visit 40:  Pt had met goal but had recent fall and now requiring min guard to supv   Time 6    Period Weeks    Status  Met    Target Date 11/08/21      OT LONG TERM GOAL #4 10/10/22 (12 weeks)    Title Pt will improve L hand grip to 25 or more #s to enable pt to open a new jar.   Baseline no grip in left hand at eval, 10th visit:  improved flexion but still working towards composite fisting and grip. 20th:  continues to demo decreased grip; 08/17/21: active digit flexion improving, but not yet able to register grip on dynamometer; 09/04/21: L grip 1# 7/17:  5#; 12/25/21: L grip 6#; 02/05/22: 13#; 03/12/22: L grip 12#; 1/23//24: L grip 3# (decreased); 04/25/22: L grip 14#; spouse assists to open new jars; 05/21/22: L grip 15#;  07/04/22: L grip 19#; 07/18/22: L grip 22#, difficulty opening a new jar (R grip 50#)   Time 12    Period Weeks    Status Revised/ongoing   Target Date 10/10/22     OT LONG TERM GOAL #5   Title Pt will improve left shoulder flexion to 100 degrees or better to improve reaching to obtain self care items from shelf/shoulder height.    Baseline difficulty with reach, shoulder flexion to 47 degrees; 08/17/21: L shoulder flexion 85, but not yet able to consistently hold ADL supplies in L hand when reaching; 09/04/21: flexion 85 7/17:  shoulder flexion to 90; 12/25/21: 105 (P 125); 03/12/22: active L shoulder flexion 110 (pt uses R arm to reach for items at shoulder height or above); 04/16/22: L shoulder flexion 107; 04/25/22: L shoulder flexion 125*, R 132*    Time 12    Period Weeks    Status achieved   Target Date 04/25/22     OT LONG TERM GOAL #6   Title Pt will improve FOTO score to 47 or above to demonstrate a clinically relevant change in function to impact ADL tasks.    Baseline score of 30 at eval; 08/17/21: FOTO: 42; 09/04/21: FOTO : 54 , FOTO 7/7:  60; 12/25/21: 59; 03/12/22: 66; 04/16/22: 67; 04/25/22; 51; 05/21/22: 74; 07/04/22: 74; 07/18/22: 71   Time 12    Period Weeks    Status  MET/ongoing    Target Date 10/10/22     OT LONG TERM GOAL #7   Title Pt will demonstrate ability to pick up small objects with L hand and complete 9 hole peg test in less than 1 min (revised)   Baseline unable to perform at eval, 10th visit: still unable to pick up small pegs, 20th: unable to pick up pegs but improving; 08/17/21: not  attempted d/t time constraints, will assess next visit; 09/04/21: 9 hole peg test in 5 min 53 sec;  7/17:  Pt unable to complete this date but was able to place 6 of 9 pegs in 4 mins 20 secs; 12/25/21: L 1 min 43 sec; 03/12/22: L 1 min 2 sec (requires non-skid surface to pick up small items from table top); 04/16/22: 5 pegs in 2 min 36 secs; 04/25/22: 1 min 48 sec; 05/21/22: L 1 min 23 sec; 07/04/22: 1 min  14 sec    Time 12    Period Weeks    Status Revised/On-going    Target Date 10/10/22    OT LONG TERM GOAL #8   Title Pt will increase LUE strength by 1 MM grade in order to hold blow dryer in LUE to dry hair without dropping dryer.   Baseline 04/25/22: Unable to sustain grip and maintain lift of LUE with hand near head without dropping dryer.  Dryer has caused bruising above L eye with previous attempts.  L shoulder flex/abd 3/5, elbow flex/ext 3+/5; 07/04/22: L shoulder flex/abd 3+, elbow and wrist flex/ext 4+    Time 12    Period Weeks    Status New/ ongoing   Target Date 10/10/22        OT LONG TERM GOAL #9   Title Pt will increase L hand dexterity to enable pt to independently manage clothing fasteners.   Baseline 04/25/22: pt ties shoe laces loosely and requires assist from spouse occasionally.  When typing, pt engages the L hand for ~25% of the task.  Pt avoids wearing jeans d/t inability to manage button or zipper; 07/04/22: no change yet from 04/25/22; 07/18/22: Managed zipper today, but increased time and effort to manage buttons on pants; able to tie shoes loosely with increased time   Time 12    Period Weeks    Status New    Target Date 10/10/22        OT LONG TERM GOAL #10   Title Pt will be able to independently carry a light plate of food or drink in L hand without spilling/dropping to increase efficiency with item transport in the kitchen.   Baseline 04/25/22: Pt requires constant cues to keep cup or plate level while multitasking in order to prevent spilling; 07/04/22: Pt reports she is carrying her coffee cup from kitchen to neighboring room (not yet consistent to avoid spills)    Time 12    Period Weeks    Status New/ongoing   Target Date 10/10/22     Plan -     Clinical Impression Statement Pt returned after a 10 day trip to visit family in [place].  L hand has been more swollen since yesterday.  Anticipate increased swelling from air travel.  Following edema massage to L hand, pt  was better able to slide her rings on/off PIP joint of L RF.   Continued focus on L hand strength, FMC/dexterity skills.  Practiced clasping an earring back to a clothespin today, but decreased sensation in L hand limited success.  For now, and likely ongoing, spouse assists pt to clasp and unclasp earrings d/t pt with decreased strength/coordination and decreased sensation in the L hand, and pt is fearful to loose an earring with her limited skills noted above.  L hand strength continues to limit her grasping heavier ADL supplies.  Pt states that she can not hold a heavy plate in her L hand and still requires assist to move heavier  dishes in and out of the oven.  Pt will continue to work towards goals in plan of care to improve left UE functional use in ADL and IADL tasks at home and in the community.    OT Occupational Profile and History Detailed Assessment- Review of Records and additional review of physical, cognitive, psychosocial history related to current functional performance    Occupational performance deficits (Please refer to evaluation for details): ADL's;IADL's;Leisure;Rest and Sleep    Body Structure / Function / Physical Skills ADL;Coordination;Endurance;GMC;UE functional use;Balance;IADL;Pain;Dexterity;FMC;Strength;Edema;Mobility;ROM    Psychosocial Skills Environmental  Adaptations;Habits;Routines and Behaviors    Rehab Potential Good    Clinical Decision Making Several treatment options, min-mod task modification necessary   Comorbidities Affecting Occupational Performance: May have comorbidities impacting occupational performance    Modification or Assistance to Complete Evaluation  Min-Moderate modification of tasks or assist with assess necessary to complete eval    OT Frequency 2x / week    OT Duration 12 weeks    OT Treatment/Interventions Self-care/ADL training;Cryotherapy;Paraffin;Therapeutic exercise;DME and/or AE instruction;Functional Mobility Training;Balance  training;Electrical Stimulation;Ultrasound;Neuromuscular education;Manual Therapy;Splinting;Moist Heat;Contrast Bath;Passive range of motion;Therapeutic activities;Patient/family education;Coping strategies training    Plan OT recert    Consulted and Agree with Plan of Care Patient           Danelle Earthly, MS, OTR/L  [employer] Dials, OT 08/13/2022, 5:04 PM

## 2022-08-14 DIAGNOSIS — Z87891 Personal history of nicotine dependence: Secondary | ICD-10-CM | POA: Diagnosis not present

## 2022-08-14 DIAGNOSIS — N1832 Chronic kidney disease, stage 3b: Secondary | ICD-10-CM | POA: Diagnosis not present

## 2022-08-14 DIAGNOSIS — I129 Hypertensive chronic kidney disease with stage 1 through stage 4 chronic kidney disease, or unspecified chronic kidney disease: Secondary | ICD-10-CM | POA: Diagnosis not present

## 2022-08-14 DIAGNOSIS — E039 Hypothyroidism, unspecified: Secondary | ICD-10-CM | POA: Diagnosis not present

## 2022-08-14 DIAGNOSIS — Z79899 Other long term (current) drug therapy: Secondary | ICD-10-CM | POA: Diagnosis not present

## 2022-08-14 DIAGNOSIS — E782 Mixed hyperlipidemia: Secondary | ICD-10-CM | POA: Diagnosis not present

## 2022-08-14 DIAGNOSIS — I69354 Hemiplegia and hemiparesis following cerebral infarction affecting left non-dominant side: Secondary | ICD-10-CM | POA: Diagnosis not present

## 2022-08-14 NOTE — Therapy (Signed)
OUTPATIENT PHYSICAL THERAPY NEURO TREATMENT/RECERT   Patient Name: Teresa [name] MRN: [id number] DOB:1940/11/28, 82 y.o., female Today's Date: 08/15/2022   PCP: Dr. Aram Beecham REFERRING PROVIDER: Dr. Aram Beecham  END OF SESSION:  PT End of Session - 08/15/22 0937     Visit Number 18    Number of Visits 41    Date for PT Re-Evaluation 11/05/22    Authorization Type [employer] PPO-    Authorization Time Period 05/23/2022- 08/15/2022    Progress Note Due on Visit 10    PT Start Time 1015    PT Stop Time 1058    PT Time Calculation (min) 43 min    Equipment Utilized During Treatment Gait belt    Activity Tolerance Patient tolerated treatment well;No increased pain    Behavior During Therapy WFL for tasks assessed/performed                        Past Medical History:  Diagnosis Date   Cancer (HCC)    skin   Hypothyroidism    Raynaud's disease    Stroke [facility])    Past Surgical History:  Procedure Laterality Date   ABDOMINAL HYSTERECTOMY  1991   APPENDECTOMY  1991   BACK SURGERY  2010   Cervical fusion C4-5-6-7   CATARACT EXTRACTION, BILATERAL Bilateral 10/2016   CHOLECYSTECTOMY  1995   COLONOSCOPY WITH PROPOFOL N/A 12/08/2018   Procedure: COLONOSCOPY WITH PROPOFOL;  Surgeon: Toledo, Boykin Nearing, MD;  Location: [facility] ENDOSCOPY;  Service: Gastroenterology;  Laterality: N/A;   EYE SURGERY     Patient Active Problem List   Diagnosis Date Noted   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   Stroke (HCC) 02/27/2021   History of nonmelanoma skin cancer 05/23/2014    ONSET DATE: 04/16/2022  REFERRING DIAG: 69.354 (ICD-10-CM) - Hemiplegia and hemiparesis following cerebral infarction affecting left non-dominant side   THERAPY DIAG:  Muscle weakness (generalized)  Right middle cerebral artery stroke (HCC)  Unsteadiness on feet  Rationale for Evaluation and Treatment: Rehabilitation  SUBJECTIVE:                                                                                                                                                                                              SUBJECTIVE STATEMENT: Patient reports no aches or pains. Patient saw physician yesterday, was given pain medicine for her neck.    Pt accompanied by: significant other  PERTINENT HISTORY: Pt is a 82 y.o. female with referral for hemiplegia with left sided weakness -original CVA on 02/26/21 and new onset of left sided weakness  on 04/16/2022.  R MCA CVA on 02/26/21. Pt completed outpatient PT on 01/17/2022 with majority of goals met. PMH includes: skin cancer, hypothyroidism, and Raynaud's disease, history of cervical fusion in 2010 C4-C7.   PAIN:  Are you having pain? No  PRECAUTIONS: Fall  WEIGHT BEARING RESTRICTIONS: No  FALLS: Has patient fallen in last 6 months? No  LIVING ENVIRONMENT: Lives with: lives with their spouse Lives in: House/apartment Stairs: Yes: External: 1 steps; none Has following equipment at home: Single point cane, Walker - 2 wheeled, shower chair, and Grab bars  PLOF: Independent  PATIENT GOALS: I want to walk without this cane and no falls and enjoy going out to grandkids sporting event  OBJECTIVE:   DIAGNOSTIC FINDINGS: CLINICAL DATA:  Stroke suspected   EXAM: MRI HEAD WITHOUT CONTRAST   TECHNIQUE: Multiplanar, multiecho pulse sequences of the brain and surrounding structures were obtained without intravenous contrast.   COMPARISON:  MRI Head 02/26/21, CT Head 04/16/22   FINDINGS: Brain: Evolving right MCA territory infarct involving the right frontal operculum in the right insular cortex. No acute infarction, hemorrhage, hydrocephalus, extra-axial collection or mass lesion. Small focus of microhemorrhage in the left temporal lobe. There is petechial hemorrhage and/or hemosiderosis in the region of prior right MCA territory infarct. T2/stir hyperintense signal in the right cerebral peduncle is  favored to represent transneuronal degeneration.   Vascular: Major flow voids are preserved.   Skull and upper cervical spine: Normal marrow signal.   Sinuses/Orbits: Bilateral lens replacement. No mastoid or middle ear effusion. Paranasal sinuses are clear.   Other: None.   IMPRESSION: 1. No acute intracranial abnormality. 2. 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 3.     Electronically Signed   By: Lorenza Cambridge M.D.   On: 04/16/2022 13:41  COGNITION: Overall cognitive status: Within functional limits for tasks assessed   SENSATION: WFL  COORDINATION: Intact with each LE  EDEMA:  None observed  MUSCLE TONE: LLE: Within functional limits  DTRs:  Patella 2+ = Normal and Achilles 2+ = Normal  POSTURE: rounded shoulders and forward head  LOWER EXTREMITY ROM:     Active  Right Eval Left Eval  Hip flexion WNL THROUGHOUT LE WNL THROUGHOUT LE  Hip extension    Hip abduction    Hip adduction    Hip internal rotation    Hip external rotation    Knee flexion    Knee extension    Ankle dorsiflexion    Ankle plantarflexion    Ankle inversion    Ankle eversion     (Blank rows = not tested)  LOWER EXTREMITY MMT:    MMT Right Eval Left Eval  Hip flexion 4+ 4  Hip extension 4+ 4  Hip abduction 4+ 4  Hip adduction 4+ 4  Hip internal rotation 4+ 4  Hip external rotation 4+ 4  Knee flexion 4+ 4  Knee extension 5 4  Ankle dorsiflexion 5 4  Ankle plantarflexion    Ankle inversion    Ankle eversion    (Blank rows = not tested)  BED MOBILITY:  Patient reports independent with all bed mobility and states has returned to master bed  TRANSFERS: Assistive device utilized: None  Sit to stand: Complete Independence Stand to sit: Complete Independence Chair to chair: Complete Independence Floor:  Not tested   GAIT: Gait pattern: step through pattern, decreased arm swing- Right, decreased arm  swing- Left, decreased step length- Right, and decreased  step length- Left Distance walked: 915 feet Assistive device utilized: None Level of assistance: SBA Comments: occasional scuff or decreased left foot clearance  FUNCTIONAL TESTS:  5 times sit to stand: 23.62 sec without UE support Timed up and go (TUG): 20.14 sec without an AD 10 meter walk test: 0.70 m/s  PATIENT SURVEYS:  FOTO 60/ goal of 66  TODAY'S TREATMENT:                                                                                                                              DATE: 08/15/22      Gait belt donned and CGA provided unless otherwise specified     Neuro Re-Ed: at support surface  Incline static stand 30 seconds x 2 trials   Standing with CGA next to support surface:  Airex pad: static stand 30 seconds x 2 trials, noticeable trembling of ankles/LE's with fatigue and challenge to maintain stability Airex pad: horizontal head turns 30 seconds scanning room 10x ; cueing for arc of motion  Airex pad: vertical head turns 30 seconds, cueing for arc of motion, noticeable sway with upward gaze increasing demand on ankle righting reaction musculature Airex pad: one foot on 6" step one foot on airex pad, hold position for 30 seconds, switch legs, 2x each LE;  Step over orange hurdle and back 10x each LE forward, 10x each LE sideways ; x 2 sets each       Therex:   Interval ambulation: walk standard pace 30 seconds, fast walk 30 seconds ; 400 ft x 2 trials    Seated:  -adduction 15x  -adduction squeeze with LAQ 15x -RTB hamstring curl 15x -GTB abduction 15x  -GTB march 15x  PATIENT EDUCATION: Education details: Pt educated throughout session about proper posture and technique with exercises. Improved exercise technique, movement at target joints, use of target muscles after min to mod verbal, visual, tactile cues.  Person educated: Patient Education method: Explanation, Demonstration, Tactile  cues, and Verbal cues Education comprehension: verbalized understanding, returned demonstration, verbal cues required, tactile cues required, and needs further education  HOME EXERCISE PROGRAM: To be updated next 1-2 visit  GOALS: Goals reviewed with patient? Yes  SHORT TERM GOALS: Target date: 07/04/2022  Patient will be independent in home exercise program to improve strength/mobility for better functional independence with ADLs  Baseline: EVAL: Patient reports mostly just walking; 07/09/22: Pt reports her exercises are going well, to be updated future date Goal status: IN PROGRESS  LONG TERM GOALS: Target date: 11/05/2022    Pt will decrease 5TSTS by at least 6 seconds in order to demonstrate clinically significant improvement in LE strength. Baseline: EVAL= 23.62 sec without UE; 07/09/22:  13 seconds hands-free  Goal status: MET  2. Pt will improve FOTO to target score of 66 to display perceived improvements in ability to complete ADL's.  Baseline: Eval= 60; 07/09/22: 65 5/21: 59% Goal status: PARTIALLY MET  3.  Pt will  decrease TUG to below 14 seconds/decrease in order to demonstrate decreased fall risk. Baseline: Eval= 20.14 sec without AD; 07/09/22: 11 seconds no AD Goal status: MET  4.  Patient will increase six minute walk test distance to >1200 ft for improve gait ability and return to PLOF to complete her walks in her neighborhood.  Baseline: EVAL= 915 feet without an AD; 07/09/22: 1184 ft without AD 5/21: 1060 ft  Goal status: Partially MET  5. Pt will increase by at least 0.13 m/s in order to demonstrate clinically significant improvement in community ambulation.    Baseline: Eval = 0.70 m/s; 07/09/2022: 1.04 m/s without AD   Goal status: MET   6. Pt will report that she has not hit LLE/LUE on doorframe/walls/furniture while ambulating in the past two weeks to indicate improved ability to scan environment and to decrease injury risk with gait.  Baseline: currently  has hit furniture/doorframe 2x/week 5/21: hitting doorframe less frequently.   Goal status: Ongoing  7. Patient will increase Functional Gait Assessment score to >20/30 as to reduce fall risk and improve dynamic gait safety with community ambulation.  Baseline:5/21: 13/30  Goal status: NEW   ASSESSMENT:  CLINICAL IMPRESSION: Patient tolerates progressive strengthening and stability interventions. She is very fatigued with interval ambulation alternating between thirty second fast and standard walking speed. Patient is able to stabilize on airex pad but is challenged with head turns on unstable surface. She remains highly motivated for progression of care.  The pt will benefit from skilled PT services to improve her overall left LE muscle strength and improve her mobility to decrease her fall risk and improve her overall quality of life.   OBJECTIVE IMPAIRMENTS: Abnormal gait, decreased activity tolerance, decreased balance, decreased coordination, decreased endurance, decreased mobility, difficulty walking, and decreased strength.   ACTIVITY LIMITATIONS: carrying, lifting, bending, standing, squatting, stairs, and transfers  PARTICIPATION LIMITATIONS: cleaning, laundry, driving, shopping, community activity, and yard work  PERSONAL FACTORS: 3+ comorbidities: OA, HTN, hx of previous CVA  are also affecting patient's functional outcome.   REHAB POTENTIAL: Good  CLINICAL DECISION MAKING: Evolving/moderate complexity  EVALUATION COMPLEXITY: Moderate  PLAN:  PT FREQUENCY: 1-2x/week  PT DURATION: 12 weeks  PLANNED INTERVENTIONS: Therapeutic exercises, Therapeutic activity, Neuromuscular re-education, Balance training, Gait training, Patient/Family education, Self Care, Joint mobilization, Joint manipulation, Stair training, Vestibular training, Canalith repositioning, DME instructions, Dry Needling, Electrical stimulation, Spinal manipulation, Spinal mobilization, Cryotherapy, Moist heat,  Taping, and Manual therapy  PLAN FOR NEXT SESSION: Progressive balance training and LE strengthening and add to HEP as appropriate next session.   Precious Bard PT  Physical Therapist- [place]  [facility]  08/15/2022, 11:00 AM

## 2022-08-15 ENCOUNTER — Ambulatory Visit: Payer: Medicare HMO

## 2022-08-15 DIAGNOSIS — R278 Other lack of coordination: Secondary | ICD-10-CM | POA: Diagnosis not present

## 2022-08-15 DIAGNOSIS — I63511 Cerebral infarction due to unspecified occlusion or stenosis of right middle cerebral artery: Secondary | ICD-10-CM

## 2022-08-15 DIAGNOSIS — M6281 Muscle weakness (generalized): Secondary | ICD-10-CM

## 2022-08-15 DIAGNOSIS — R2689 Other abnormalities of gait and mobility: Secondary | ICD-10-CM | POA: Diagnosis not present

## 2022-08-15 DIAGNOSIS — R262 Difficulty in walking, not elsewhere classified: Secondary | ICD-10-CM | POA: Diagnosis not present

## 2022-08-15 DIAGNOSIS — R2681 Unsteadiness on feet: Secondary | ICD-10-CM | POA: Diagnosis not present

## 2022-08-15 DIAGNOSIS — R482 Apraxia: Secondary | ICD-10-CM | POA: Diagnosis not present

## 2022-08-15 DIAGNOSIS — M79604 Pain in right leg: Secondary | ICD-10-CM | POA: Diagnosis not present

## 2022-08-15 NOTE — Therapy (Signed)
OUTPATIENT OCCUPATIONAL THERAPY NEURO TREATMENT NOTE          Patient Name: Teresa [name] MRN: [id number] DOB:Jun 28, 1940, 82 y.o., female Today's Date: 05/23/2022  PCP: Dr. Aram Beecham REFERRING PROVIDER: Dr. Aram Beecham   OT End of Session - 08/15/22 0951     Visit Number 119    Number of Visits 138    Date for OT Re-Evaluation 10/10/22    Authorization Time Period Reporting period beginning 07/04/22    Progress Note Due on Visit 10    OT Start Time 0930    OT Stop Time 1015    OT Time Calculation (min) 45 min    Equipment Utilized During Treatment cane    Activity Tolerance Patient tolerated treatment well    Behavior During Therapy WFL for tasks assessed/performed            Past Medical History:  Diagnosis Date   Cancer (HCC)    skin   Hypothyroidism    Raynaud's disease    Stroke [facility])    Past Surgical History:  Procedure Laterality Date   ABDOMINAL HYSTERECTOMY  1991   APPENDECTOMY  1991   BACK SURGERY  2010   Cervical fusion C4-5-6-7   CATARACT EXTRACTION, BILATERAL Bilateral 10/2016   CHOLECYSTECTOMY  1995   COLONOSCOPY WITH PROPOFOL N/A 12/08/2018   Procedure: COLONOSCOPY WITH PROPOFOL;  Surgeon: Toledo, Boykin Nearing, MD;  Location: [facility] ENDOSCOPY;  Service: Gastroenterology;  Laterality: N/A;   EYE SURGERY     Patient Active Problem List   Diagnosis Date Noted   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  Following  inpt rehab, pt went home and had home health.   PRECAUTIONS: fall  SUBJECTIVE: Pt reports doing well today.  PAIN:  Are you having pain? none  OBJECTIVE:  L grip 6, R grip 41 L lateral pinch 5, R 13 L 3 point pinch 4, R 14  L 9 hole 1 min 43 sec. L shoulder active flexion 0-105, passive 0-125 02/05/22: L grip 13#  L lateral pinch 8#  3 point pinch 5#  L shoulder flex 0-108 03/12/22:  L grip: 12# L lateral pinch: 8# 3 point pinch: 6# L 9 hole: 1 min 3 sec L shoulder flexion 0-110 04/16/22: L grip: 3# L lateral pinch: 4# 3 point pinch: difficulty maintaining 3 point pinch  L 9 hole: 5 pegs in 2 min 36 sec  L shoulder flexion 0-107 BP L 132/79 HR 80 02 sats 96% 04/25/22: L grip: 14#; R grip: 50# L lateral 2#, R lateral 17# 3 point pinch: L 4#, R 14# (Saehan pinch gauge) 9 hole peg test: 1 min, 48 sec L shoulder flex 125 (R shoulder 132 active) 05/21/22: L grip: 15#; R grip: 50# L lateral 8#, R lateral 20# 3 point pinch: L 4#, R 12# (Saehan pinch gauge) 9 hole peg test: 1 min, 23 sec 07/04/22: L grip: 19# L lateral 10# 3 point pinch: L 6# (standard  pinch gauge-not Saehan) 9 hole peg test: 1 min, 14 sec 07/18/22:  L grip 22# L lateral: 9# L 3 point pinch: 6# L 9 hole: 1 min 8 sec   TODAY'S TREATMENT: Therapeutic Exercise: Facilitated hand strengthening with use of hand gripper set at 6.6# to remove jumbo pegs from pegboard x2 trials using L hand.  Pt required cues to perform peg set up with L hand rather than R and intermittent min vc to maintain L SF around gripper with each rep.    Neuro re-ed: Facilitated L hand FMC/dexterity skills working to pick up grooved pegs and place into pegboard.  Pt was able to manage 2 rows of pegs with extra time and repeat trials, often having to place a peg back down to re-grip, and did have several dropped pegs when trying to reposition pegs within fingertips.    PATIENT EDUCATION: Education details: Reviewed edema management  strategies, including elevation, massage, use of compression glove, and AROM.  Pt verbalized understanding.   Person educated: pt Education method: explanation, demo Education comprehension: verbalized understanding, demonstrated understanding  HOME EXERCISE PROGRAM Continue to engage LUE into ADLs; continue use of putty for gripping and pinching exercises for LUE, and L shoulder AROM/AAROM, crocheting, typing; increase participation in IADL tasks for greater use of L hand.    OT Short Term Goals - 08/29/22 (6 weeks)      OT SHORT TERM GOAL #1   Title Pt will be independent with home exercise program.    Baseline Eval: no current program, 10th visit:  continue to add new exercises as pt progresses, 20th:  continue to update HEP; 08/17/21: continue to progress HEP when indicated.  Visit 40: adding new exercises as pt progresses; 12/25/21: ongoing with progressions; 03/12/22: inconsistent use of putty but regular use of stress ball (every other day).  Encouraged pt return to daily putty use (2x daily when able) to target grip and pinch strengthening; 04/16/22: not reviewed today d/t OT sent pt to ED; 04/25/22: added pulleys and pt has re-started putty; 05/21/22: stopped pulleys and added stronger theraputty and encouraged pt crochet often; 07/04/22: pt working consistently with putty and grip strengthener at 20# at home; 07/18/22: pt continues to work on putty and Building surveyor and will be starting with her crocheting again to progress [facility] skills   Time 12    Period Weeks    Status On-going    Target Date 08/29/22     OT LONG TERM GOAL #2   Title Pt will complete UB and LB dressing with modified independence including buttons, snaps and zippers.    Baseline requires min assist at eval, 10th visit: occasional assist with buttons, 20th:  able to perform one handed, but difficulty with bilateral UE; 08/17/21: pt reports inconsistent with 1 hand, reviewed techniques this visit, Visit 40:  Pt requires assist  with bra   Time 12    Period Weeks    Status MET   Target Date 11/08/21     OT LONG TERM GOAL #3   Title Pt will perform shower transfer with modified independence.    Baseline Pt requires supervision to min assist for shower transfer at home. 10th visit: supervision; 08/17/21: supv .  Visit 40:  Pt had met goal but had recent fall and now requiring min guard to supv   Time 6    Period Weeks    Status  Met    Target Date 11/08/21      OT LONG TERM  GOAL #4 10/10/22 (12 weeks)    Title Pt will improve L hand grip to 25 or more #s to enable pt to open a new jar.   Baseline no grip in left hand at eval, 10th visit:  improved flexion but still working towards composite fisting and grip. 20th:  continues to demo decreased grip; 08/17/21: active digit flexion improving, but not yet able to register grip on dynamometer; 09/04/21: L grip 1# 7/17:  5#; 12/25/21: L grip 6#; 02/05/22: 13#; 03/12/22: L grip 12#; 1/23//24: L grip 3# (decreased); 04/25/22: L grip 14#; spouse assists to open new jars; 05/21/22: L grip 15#; 07/04/22: L grip 19#; 07/18/22: L grip 22#, difficulty opening a new jar (R grip 50#)   Time 12    Period Weeks    Status Revised/ongoing   Target Date 10/10/22     OT LONG TERM GOAL #5   Title Pt will improve left shoulder flexion to 100 degrees or better to improve reaching to obtain self care items from shelf/shoulder height.    Baseline difficulty with reach, shoulder flexion to 47 degrees; 08/17/21: L shoulder flexion 85, but not yet able to consistently hold ADL supplies in L hand when reaching; 09/04/21: flexion 85 7/17:  shoulder flexion to 90; 12/25/21: 105 (P 125); 03/12/22: active L shoulder flexion 110 (pt uses R arm to reach for items at shoulder height or above); 04/16/22: L shoulder flexion 107; 04/25/22: L shoulder flexion 125*, R 132*    Time 12    Period Weeks    Status achieved   Target Date 04/25/22     OT LONG TERM GOAL #6   Title Pt will improve FOTO score to 47 or above to  demonstrate a clinically relevant change in function to impact ADL tasks.    Baseline score of 30 at eval; 08/17/21: FOTO: 42; 09/04/21: FOTO : 54 , FOTO 7/7:  60; 12/25/21: 59; 03/12/22: 66; 04/16/22: 67; 04/25/22; 51; 05/21/22: 74; 07/04/22: 74; 07/18/22: 71   Time 12    Period Weeks    Status  MET/ongoing    Target Date 10/10/22     OT LONG TERM GOAL #7   Title Pt will demonstrate ability to pick up small objects with L hand and complete 9 hole peg test in less than 1 min (revised)   Baseline unable to perform at eval, 10th visit: still unable to pick up small pegs, 20th: unable to pick up pegs but improving; 08/17/21: not attempted d/t time constraints, will assess next visit; 09/04/21: 9 hole peg test in 5 min 53 sec;  7/17:  Pt unable to complete this date but was able to place 6 of 9 pegs in 4 mins 20 secs; 12/25/21: L 1 min 43 sec; 03/12/22: L 1 min 2 sec (requires non-skid surface to pick up small items from table top); 04/16/22: 5 pegs in 2 min 36 secs; 04/25/22: 1 min 48 sec; 05/21/22: L 1 min 23 sec; 07/04/22: 1 min 14 sec    Time 12    Period Weeks    Status Revised/On-going    Target Date 10/10/22    OT LONG TERM GOAL #8   Title Pt will increase LUE strength by 1 MM grade in order to hold blow dryer in LUE to dry hair without dropping dryer.   Baseline 04/25/22: Unable to sustain grip and maintain lift of LUE with hand near head without dropping dryer.  Dryer has caused bruising above L eye with previous attempts.  L shoulder flex/abd 3/5, elbow flex/ext 3+/5; 07/04/22: L shoulder flex/abd 3+, elbow and wrist flex/ext 4+    Time 12    Period Weeks    Status New/ ongoing   Target Date 10/10/22        OT LONG TERM GOAL #9   Title Pt will increase L hand dexterity to enable pt to independently manage clothing fasteners.   Baseline 04/25/22: pt ties shoe laces loosely and requires assist from spouse occasionally.  When typing, pt engages the L hand for ~25% of the task.  Pt avoids wearing jeans d/t  inability to manage button or zipper; 07/04/22: no change yet from 04/25/22; 07/18/22: Managed zipper today, but increased time and effort to manage buttons on pants; able to tie shoes loosely with increased time   Time 12    Period Weeks    Status New    Target Date 10/10/22        OT LONG TERM GOAL #10   Title Pt will be able to independently carry a light plate of food or drink in L hand without spilling/dropping to increase efficiency with item transport in the kitchen.   Baseline 04/25/22: Pt requires constant cues to keep cup or plate level while multitasking in order to prevent spilling; 07/04/22: Pt reports she is carrying her coffee cup from kitchen to neighboring room (not yet consistent to avoid spills)    Time 12    Period Weeks    Status New/ongoing   Target Date 10/10/22     Plan -     Clinical Impression Statement Pt reported that she still felt some swelling in her L hand digits again today, but OT observed no changes and pt's rings were still fitting appropriately.  OT reviewed edema management strategies.  Pt had fewer dropped pegs over 2 trials when working to remove jumbo pegs from pegboard with 6.6# hand gripper setting.  Pt drops fewer pegs when she remembers to focus her attention on L hand.  Pt continues to be challenged by advanced coordination tasks, but pt was able to manage 2 rows of grooved pegs with extra time and repeat trials, often having to place a peg back down to re-grip.  Pt did have several dropped pegs when trying to reposition pegs within fingertips.  Pt will continue to work towards goals in plan of care to improve left UE functional use in ADL and IADL tasks at home and in the community.    OT Occupational Profile and History Detailed Assessment- Review of Records and additional review of physical, cognitive, psychosocial history related to current functional performance    Occupational performance deficits (Please refer to evaluation for details):  ADL's;IADL's;Leisure;Rest and Sleep    Body Structure / Function / Physical Skills ADL;Coordination;Endurance;GMC;UE functional use;Balance;IADL;Pain;Dexterity;FMC;Strength;Edema;Mobility;ROM    Psychosocial Skills Environmental  Adaptations;Habits;Routines and Behaviors    Rehab Potential Good    Clinical Decision Making Several treatment options, min-mod task modification necessary   Comorbidities Affecting Occupational Performance: May have comorbidities impacting occupational performance    Modification or Assistance to Complete Evaluation  Min-Moderate modification of tasks or assist with assess necessary to complete eval    OT Frequency 2x / week    OT Duration 12 weeks    OT Treatment/Interventions Self-care/ADL training;Cryotherapy;Paraffin;Therapeutic exercise;DME and/or AE instruction;Functional Mobility Training;Balance training;Electrical Stimulation;Ultrasound;Neuromuscular education;Manual Therapy;Splinting;Moist Heat;Contrast Bath;Passive range of motion;Therapeutic activities;Patient/family education;Coping strategies training    Plan OT recert    Consulted and Agree with Plan of Care Patient  Danelle Earthly, MS, OTR/L  Otis Dials, OT 08/15/2022, 10:09 AM

## 2022-08-16 ENCOUNTER — Emergency Department: Payer: Medicare HMO

## 2022-08-16 ENCOUNTER — Other Ambulatory Visit: Payer: Self-pay

## 2022-08-16 ENCOUNTER — Encounter: Payer: Self-pay | Admitting: Emergency Medicine

## 2022-08-16 ENCOUNTER — Emergency Department
Admission: EM | Admit: 2022-08-16 | Discharge: 2022-08-16 | Disposition: A | Discharge status: Home / Self Care | Payer: Medicare HMO | Attending: Student in an Organized Health Care Education/Training Program | Admitting: Student in an Organized Health Care Education/Training Program

## 2022-08-16 DIAGNOSIS — T50905A Adverse effect of unspecified drugs, medicaments and biological substances, initial encounter: Secondary | ICD-10-CM

## 2022-08-16 DIAGNOSIS — T428X5A Adverse effect of antiparkinsonism drugs and other central muscle-tone depressants, initial encounter: Secondary | ICD-10-CM | POA: Insufficient documentation

## 2022-08-16 DIAGNOSIS — R4182 Altered mental status, unspecified: Secondary | ICD-10-CM | POA: Insufficient documentation

## 2022-08-16 DIAGNOSIS — I10 Essential (primary) hypertension: Secondary | ICD-10-CM | POA: Diagnosis not present

## 2022-08-16 DIAGNOSIS — R531 Weakness: Secondary | ICD-10-CM | POA: Diagnosis not present

## 2022-08-16 DIAGNOSIS — R0902 Hypoxemia: Secondary | ICD-10-CM | POA: Diagnosis not present

## 2022-08-16 LAB — CBC WITH DIFFERENTIAL/PLATELET
Abs Immature Granulocytes: 0.01 10*3/uL (ref 0.00–0.07)
Basophils Absolute: 0.1 10*3/uL (ref 0.0–0.1)
Basophils Relative: 1 %
Eosinophils Absolute: 0.2 10*3/uL (ref 0.0–0.5)
Eosinophils Relative: 4 %
HCT: 40 % (ref 36.0–46.0)
Hemoglobin: 13.6 g/dL (ref 12.0–15.0)
Immature Granulocytes: 0 %
Lymphocytes Relative: 28 %
Lymphs Abs: 1.4 10*3/uL (ref 0.7–4.0)
MCH: 29.9 pg (ref 26.0–34.0)
MCHC: 34 g/dL (ref 30.0–36.0)
MCV: 87.9 fL (ref 80.0–100.0)
Monocytes Absolute: 0.4 10*3/uL (ref 0.1–1.0)
Monocytes Relative: 8 %
Neutro Abs: 2.9 10*3/uL (ref 1.7–7.7)
Neutrophils Relative %: 59 %
Platelets: 198 10*3/uL (ref 150–400)
RBC: 4.55 MIL/uL (ref 3.87–5.11)
RDW: 13.1 % (ref 11.5–15.5)
WBC: 4.9 10*3/uL (ref 4.0–10.5)
nRBC: 0 % (ref 0.0–0.2)

## 2022-08-16 LAB — COMPREHENSIVE METABOLIC PANEL
ALT: 18 U/L (ref 0–44)
AST: 25 U/L (ref 15–41)
Albumin: 4.4 g/dL (ref 3.5–5.0)
Alkaline Phosphatase: 75 U/L (ref 38–126)
Anion gap: 10 (ref 5–15)
BUN: 19 mg/dL (ref 8–23)
CO2: 27 mmol/L (ref 22–32)
Calcium: 9.5 mg/dL (ref 8.9–10.3)
Chloride: 103 mmol/L (ref 98–111)
Creatinine, Ser: 1.1 mg/dL — ABNORMAL HIGH (ref 0.44–1.00)
GFR, Estimated: 51 mL/min — ABNORMAL LOW (ref >60)
Glucose, Bld: 102 mg/dL — ABNORMAL HIGH (ref 70–99)
Potassium: 4 mmol/L (ref 3.5–5.1)
Sodium: 140 mmol/L (ref 135–145)
Total Bilirubin: 0.9 mg/dL (ref 0.3–1.2)
Total Protein: 8 g/dL (ref 6.5–8.1)

## 2022-08-16 LAB — TROPONIN I (HIGH SENSITIVITY): Troponin I (High Sensitivity): <2 ng/L (ref <18)

## 2022-08-16 MED ORDER — AMLODIPINE BESYLATE 5 MG PO TABS
5.0000 mg | ORAL_TABLET | Freq: Once | ORAL | Status: AC
  Administered 2022-08-16: 5 mg via ORAL
  Filled 2022-08-16: qty 1

## 2022-08-16 MED ORDER — NISOLDIPINE ER 17 MG PO TB24: 34.0000 mg | ORAL_TABLET | Freq: Every day | ORAL | Status: DC

## 2022-08-16 NOTE — ED Notes (Signed)
Pt to CT

## 2022-08-16 NOTE — ED Provider Notes (Signed)
CT Head Wo Contrast  Result Date: 08/16/2022 CLINICAL DATA:  Mental status change, unknown cause EXAM: CT HEAD WITHOUT CONTRAST TECHNIQUE: Contiguous axial images were obtained from the base of the skull through the vertex without intravenous contrast. RADIATION DOSE REDUCTION: This exam was performed according to the departmental dose-optimization program which includes automated exposure control, adjustment of the mA and/or kV according to patient size and/or use of iterative reconstruction technique. COMPARISON:  CT head 04/16/2022. FINDINGS: Brain: Remote right MCA territory infarct. No evidence of acute large vascular territory infarct, acute hemorrhage, mass lesion or midline shift. No hydrocephalus. Vascular: No hyperdense vessel. Skull: No acute fracture. Sinuses/Orbits: Clear sinuses.  No acute orbital findings. Other: No mastoid effusions. IMPRESSION: 1. No evidence of acute intracranial abnormality. 2. Remote right MCA territory infarct. Electronically Signed   By: Feliberto Harts M.D.   On: 08/16/2022 11:17     And patient resting comfortably.  Family will be with her today, discussed careful return precautions follow-up with Dr. Judithann Sheen.  Patient and family agreeable with plan  Return precautions and treatment recommendations and follow-up discussed with the patient who is agreeable with the plan.    Sharyn Creamer, MD 08/16/22 1319

## 2022-08-16 NOTE — ED Notes (Signed)
This RN was called to the room per pt's request and was notified that pt had urinated on herself. When pt was asked about prior incontinence she stated "I did last night" but when questioned further she said she normally doesn't have an issue. Pt stated she "held it earlier but wasn't able to hold it now" and per pt she was still urinating. Pt was cleaned, linens were changed, a new chuck pad was placed, along with a brief and new blankets. Pt was given paper scrub pants to wear when she is ready to go home.

## 2022-08-16 NOTE — ED Triage Notes (Addendum)
Presents via EMS from home  She was given baclofen by her PCP 2 days ago  Per EMS she took one last pm and she was had to awake this am by husband  Pt is drowsy but alert on arrival  Pt states she may be having a stroke   States she felt like this with her last stroke

## 2022-08-16 NOTE — ED Notes (Signed)
Pt was given hospital socks, and the paper scrub pants and ambulated around the room with the help of this RN. Pt states that they furniture surf at home and use a cane when outside of the home. Pt furniture surfed with the help of the stretcher and this RN but was in NAD, and VSS.

## 2022-08-16 NOTE — ED Notes (Signed)
Pt was placed on bedpan when pt got to the room. Pt thought she was able to urinate, but when bedpan was removed there was no urine. Pt was cleaned and redressed.

## 2022-08-16 NOTE — Discharge Instructions (Addendum)
Please discontinue use of baclofen.  Suspect a side effect of this medication caused her to be extremely sleepy and somewhat lethargic.  Please call Dr. Judithann Sheen to arrange close follow-up.  No driving today.  Return to the ER right away if you start to experience confusion, weakness, fever, pain, numbness, facial droop difficulty speaking weakness in arm or leg or other new concerns arise.

## 2022-08-16 NOTE — ED Provider Notes (Signed)
Vitals:   08/16/22 0926 08/16/22 1211  BP: (!) 152/118 (!) 185/98  Pulse: 77   Resp: 17   Temp: 97.7 F (36.5 C)   SpO2: 99%     Clinical Course as of 08/16/22 1300  Fri Aug 16, 2022  1258 CBC troponin and comprehensive metabolic panel without acute abnormality.  CT head reassuring, interpreted by me as negative for acute gross intracranial pathology.  Known previous MCA infarct [MQ]  1258 Patient is fully alert well-oriented no longer had any confusion or lethargy.  Symptoms all seem to have abated.  At this juncture suspect likely medication effect from new dose of baclofen which patient plans to discontinue.  Nursing to trial ambulation if she feels well and to her baseline intubated sedated discharged home to follow-up with Dr. Judithann Sheen and discontinue baclofen [MQ]    Clinical Course User Index [MQ] Sharyn Creamer, MD       Sharyn Creamer, MD 08/16/22 1300

## 2022-08-16 NOTE — ED Provider Notes (Addendum)
Medical screening examination/treatment/procedure(s) were conducted as a shared visit with non-physician practitioner(s) and myself.  I personally evaluated the patient during the encounter.  Sharyn Creamer, MD      [facility] Provider Note    Event Date/Time   First MD Initiated Contact with Patient 08/16/22 [phone]     (approximate)   History   Medication Reaction   HPI  Teresa [name] is a 82 y.o. female  with history of CVA and as listed in EMR presents to the emergency department for evaluation of altered mental status. She was started on Baclofen for neck pain yesterday morning and felt "off" all day. She took a second tablet yesterday evening and husband reports he found her in the office sound asleep and had a hard time getting her to the bed because she wasn't following commands and was very groggy. She has had 2 episodes of urinary incontinence, which is unusual for her as well. Husband called Dr. Wendee Copp office and was advised to call EMS. Patient is concerned she may have had another stroke, but denies new weakness.  Last known well was yesterday morning prior to taking 1st dose of Baclofen.  Glucose per EMS 107.      Physical Exam   Triage Vital Signs: ED Triage Vitals [08/16/22 0926]  Enc Vitals Group     BP (!) 152/118     Pulse Rate 77     Resp 17     Temp 97.7 F (36.5 C)     Temp Source Oral     SpO2 99 %     Weight      Height      Head Circumference      Peak Flow      Pain Score      Pain Loc      Pain Edu?      Excl. in GC?     Most recent vital signs: Vitals:   08/16/22 0926  BP: (!) 152/118  Pulse: 77  Resp: 17  Temp: 97.7 F (36.5 C)  SpO2: 99%    General: Awake, no distress.  CV:  Good peripheral perfusion.  Resp:  Normal effort. Breath sounds clear. Abd:  No distention. Nontender Other:  Awake, alert to person and place. Pupils equal. Left facial droop, left arm weakness. No weakness in lower  extremities. (Patient reports this is baseline). Falls asleep during conversation, but easily awakens. No sensory deficit.    ED Results / Procedures / Treatments   Labs (all labs ordered are listed, but only abnormal results are displayed) Labs Reviewed - No data to display   EKG  Pending   RADIOLOGY  Image and radiology report reviewed and interpreted by me. Radiology report consistent with the same.  Pending  PROCEDURES:  Critical Care performed: No  Procedures   MEDICATIONS ORDERED IN ED:  Medications - No data to display   IMPRESSION / MDM / ASSESSMENT AND PLAN / ED COURSE   I have reviewed the triage note.  Differential diagnosis includes, but is not limited to, medication adverse effect, altered mental status, CVA  Patient's presentation is most consistent with acute presentation with potential threat to life or bodily function.  Work up for altered mental status/CVA started. Patient moved to room 12. Care relinquished to Dr. Fanny Bien who was made aware of HPI.  Clinical Course as of 08/16/22 1259  Fri Aug 16, 2022  1258 CBC troponin and comprehensive metabolic panel without acute abnormality.  CT head reassuring, interpreted by me as negative for acute gross intracranial pathology.  Known previous MCA infarct [MQ]  1258 Patient is fully alert well-oriented no longer had any confusion or lethargy.  Symptoms all seem to have abated.  At this juncture suspect likely medication effect from new dose of baclofen which patient plans to discontinue.  Nursing to trial ambulation if she feels well and to her baseline intubated sedated discharged home to follow-up with Dr. Judithann Sheen and discontinue baclofen [MQ]    Clinical Course User Index [MQ] Sharyn Creamer, MD     FINAL CLINICAL IMPRESSION(S) / ED DIAGNOSES   Final diagnoses:  None   Initial Clinical Impression: Adverse medication effect; altered mental status  Rx / DC Orders   ED Discharge Orders     None         Note:  This document was prepared using Dragon voice recognition software and may include unintentional dictation errors.   Chinita Pester, FNP 08/16/22 1010      Sharyn Creamer, MD 08/16/22 1259

## 2022-08-20 ENCOUNTER — Ambulatory Visit: Payer: Medicare HMO

## 2022-08-20 ENCOUNTER — Ambulatory Visit: Payer: Medicare HMO | Admitting: Physical Therapy

## 2022-08-20 DIAGNOSIS — R2689 Other abnormalities of gait and mobility: Secondary | ICD-10-CM

## 2022-08-20 DIAGNOSIS — M6281 Muscle weakness (generalized): Secondary | ICD-10-CM | POA: Diagnosis not present

## 2022-08-20 DIAGNOSIS — I63511 Cerebral infarction due to unspecified occlusion or stenosis of right middle cerebral artery: Secondary | ICD-10-CM

## 2022-08-20 DIAGNOSIS — R2681 Unsteadiness on feet: Secondary | ICD-10-CM | POA: Diagnosis not present

## 2022-08-20 DIAGNOSIS — R262 Difficulty in walking, not elsewhere classified: Secondary | ICD-10-CM | POA: Diagnosis not present

## 2022-08-20 DIAGNOSIS — M79604 Pain in right leg: Secondary | ICD-10-CM

## 2022-08-20 DIAGNOSIS — R278 Other lack of coordination: Secondary | ICD-10-CM

## 2022-08-20 DIAGNOSIS — R482 Apraxia: Secondary | ICD-10-CM | POA: Diagnosis not present

## 2022-08-20 NOTE — Therapy (Signed)
OUTPATIENT OCCUPATIONAL THERAPY NEURO PROGRESS AND TREATMENT NOTE       Reporting period beginning 07/04/22-08/20/22  Patient Name: Teresa [name] MRN: [id number] DOB:08-15-1940, 82 y.o., female Today's Date: 05/23/2022  PCP: Dr. Aram Beecham REFERRING PROVIDER: Dr. Aram Beecham   OT End of Session - 08/20/22 1158     Visit Number 120    Number of Visits 138    Date for OT Re-Evaluation 10/10/22    Authorization Time Period Reporting period beginning 07/04/22-08/20/22    Progress Note Due on Visit 10    OT Start Time 1105    OT Stop Time 1145    OT Time Calculation (min) 40 min    Equipment Utilized During Treatment cane    Activity Tolerance Patient limited by lethargy    Behavior During Therapy WFL for tasks assessed/performed            Past Medical History:  Diagnosis Date   Cancer (HCC)    skin   Hypothyroidism    Raynaud's disease    Stroke [facility])    Past Surgical History:  Procedure Laterality Date   ABDOMINAL HYSTERECTOMY  1991   APPENDECTOMY  1991   BACK SURGERY  2010   Cervical fusion C4-5-6-7   CATARACT EXTRACTION, BILATERAL Bilateral 10/2016   CHOLECYSTECTOMY  1995   COLONOSCOPY WITH PROPOFOL N/A 12/08/2018   Procedure: COLONOSCOPY WITH PROPOFOL;  Surgeon: Toledo, Boykin Nearing, MD;  Location: [facility] ENDOSCOPY;  Service: Gastroenterology;  Laterality: N/A;   EYE SURGERY     Patient Active Problem List   Diagnosis Date Noted   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  Following inpt rehab, pt went home and had home health.   PRECAUTIONS: fall  SUBJECTIVE: Pt reports still feeling quite lethargic after her ED visit on Friday (see clinical impression for details).  PAIN:  Are you having pain? none  OBJECTIVE:  L grip 6, R grip 41 L lateral pinch 5, R 13 L 3 point pinch 4, R 14  L 9 hole 1 min 43 sec. L shoulder active flexion 0-105, passive 0-125 02/05/22: L grip 13#  L lateral pinch 8#  3 point pinch 5#  L shoulder flex 0-108 03/12/22:  L grip: 12# L lateral pinch: 8# 3 point pinch: 6# L 9 hole: 1 min 3 sec L shoulder flexion 0-110 04/16/22: L grip: 3# L lateral pinch: 4# 3 point pinch: difficulty maintaining 3 point pinch  L 9 hole: 5 pegs in 2 min 36 sec  L shoulder flexion 0-107 BP L 132/79 HR 80 02 sats 96% 04/25/22: L grip: 14#; R grip: 50# L lateral 2#, R lateral 17# 3 point pinch: L 4#, R 14# (Saehan pinch gauge) 9 hole peg test: 1 min, 48 sec L shoulder flex 125 (R shoulder 132 active) 05/21/22: L grip: 15#; R grip: 50# L lateral 8#, R lateral 20# 3 point pinch: L 4#, R 12# (Saehan pinch gauge) 9 hole peg test: 1  min, 23 sec 07/04/22: L grip: 19# L lateral 10# 3 point pinch: L 6# (standard pinch gauge-not Saehan) 9 hole peg test: 1 min, 14 sec 07/18/22:  L grip 22# L lateral: 9# L 3 point pinch: 6# L 9 hole: 1 min 8 sec 5/228/24: (Feeling sluggish since 08/16/22 after ED visit for medication adverse reaction) L grip: 21# L lateral: 7# L 3 point pinch: 7# (standard pinch gauge) L 9 hole: 4 pegs in 3 min (OT stopped at 3 min).   TODAY'S TREATMENT: Therapeutic Exercise: Objective measures taken and goals updated for progress note.  BP noted to be 172/79 at start of session after pt had just walked with PT.  BP taken again at end of OT session noting good decrease to 141/78.  Neuro re-ed: Facilitated L hand FMC/dexterity skills working to pick 1.5 cm alphabet dice in L  hand.  Pt attempted first storing 1 dice in hand, but was unable to successfully move dice from palm to fingertips and reposition it to find a designated letter without dropping it.  Transitioned to setting dice on table top and using digits 1-3 to rotate dice  to find a designated letter.  Pt then practiced reps of using a 3 point pinch to hold dice and slide it forward on table top to promote forward reach without utilizing trunk.    PATIENT EDUCATION: Education details: Reviewed edema management strategies, self edema massage technique, elevation of hand Person educated: pt Education method: explanation, demo Education comprehension: verbalized understanding, demonstrated understanding  HOME EXERCISE PROGRAM Continue to engage LUE into ADLs; continue use of putty for gripping and pinching exercises for LUE, and L shoulder AROM/AAROM, crocheting, typing; increase participation in IADL tasks for greater use of L hand.    OT Short Term Goals - 08/29/22 (6 weeks)      OT SHORT TERM GOAL #1   Title Pt will be independent with home exercise program.    Baseline Eval: no current program, 10th visit:  continue to add new exercises as pt progresses, 20th:  continue to update HEP; 08/17/21: continue to progress HEP when indicated.  Visit 40: adding new exercises as pt progresses; 12/25/21: ongoing with progressions; 03/12/22: inconsistent use of putty but regular use of stress ball (every other day).  Encouraged pt return to daily putty use (2x daily when able) to target grip and pinch strengthening; 04/16/22: not reviewed today d/t OT sent pt to ED; 04/25/22: added pulleys and pt has re-started putty; 05/21/22: stopped pulleys and added stronger theraputty and encouraged pt crochet often; 07/04/22: pt working consistently with putty and grip strengthener at 20# at home; 07/18/22: pt continues to work on putty and Building surveyor and will be starting with her crocheting again to progress [facility] skills; 08/20/22: Pt  continues to use her grip strengthener daily set at 20#; will continue to modify as needed   Time 12    Period Weeks    Status On-going    Target Date 08/29/22     OT LONG TERM GOAL #2   Title Pt will complete UB and LB dressing with modified independence including buttons, snaps and zippers.    Baseline requires min assist at eval, 10th visit: occasional assist with buttons, 20th:  able to perform one handed, but difficulty with bilateral UE; 08/17/21: pt reports inconsistent with 1 hand, reviewed techniques this visit, Visit 40:  Pt requires assist with bra   Time 12    Period Weeks    Status MET  Target Date 11/08/21     OT LONG TERM GOAL #3   Title Pt will perform shower transfer with modified independence.    Baseline Pt requires supervision to min assist for shower transfer at home. 10th visit: supervision; 08/17/21: supv .  Visit 40:  Pt had met goal but had recent fall and now requiring min guard to supv   Time 6    Period Weeks    Status  Met    Target Date 11/08/21      OT LONG TERM GOAL #4 10/10/22 (12 weeks)    Title Pt will improve L hand grip to 25 or more #s to enable pt to open a new jar.   Baseline no grip in left hand at eval, 10th visit:  improved flexion but still working towards composite fisting and grip. 20th:  continues to demo decreased grip; 08/17/21: active digit flexion improving, but not yet able to register grip on dynamometer; 09/04/21: L grip 1# 7/17:  5#; 12/25/21: L grip 6#; 02/05/22: 13#; 03/12/22: L grip 12#; 1/23//24: L grip 3# (decreased); 04/25/22: L grip 14#; spouse assists to open new jars; 05/21/22: L grip 15#; 07/04/22: L grip 19#; 07/18/22: L grip 22#, difficulty opening a new jar (R grip 50#); 08/20/22: L grip 21# (increased weakness since Friday after adverse reaction to a new medication resulting in ED visit).   Time 12    Period Weeks    Status Revised/ongoing   Target Date 10/10/22     OT LONG TERM GOAL #5   Title Pt will improve left shoulder flexion  to 100 degrees or better to improve reaching to obtain self care items from shelf/shoulder height.    Baseline difficulty with reach, shoulder flexion to 47 degrees; 08/17/21: L shoulder flexion 85, but not yet able to consistently hold ADL supplies in L hand when reaching; 09/04/21: flexion 85 7/17:  shoulder flexion to 90; 12/25/21: 105 (P 125); 03/12/22: active L shoulder flexion 110 (pt uses R arm to reach for items at shoulder height or above); 04/16/22: L shoulder flexion 107; 04/25/22: L shoulder flexion 125*, R 132*    Time 12    Period Weeks    Status achieved   Target Date 04/25/22     OT LONG TERM GOAL #6   Title Pt will improve FOTO score to 47 or above to demonstrate a clinically relevant change in function to impact ADL tasks.    Baseline score of 30 at eval; 08/17/21: FOTO: 42; 09/04/21: FOTO : 54 , FOTO 7/7:  60; 12/25/21: 59; 03/12/22: 66; 04/16/22: 67; 04/25/22; 51; 05/21/22: 74; 07/04/22: 74; 07/18/22: 71; 08/20/22: 77   Time 12    Period Weeks    Status  MET/ongoing    Target Date 10/10/22     OT LONG TERM GOAL #7   Title Pt will demonstrate ability to pick up small objects with L hand and complete 9 hole peg test in less than 1 min (revised)   Baseline unable to perform at eval, 10th visit: still unable to pick up small pegs, 20th: unable to pick up pegs but improving; 08/17/21: not attempted d/t time constraints, will assess next visit; 09/04/21: 9 hole peg test in 5 min 53 sec;  7/17:  Pt unable to complete this date but was able to place 6 of 9 pegs in 4 mins 20 secs; 12/25/21: L 1 min 43 sec; 03/12/22: L 1 min 2 sec (requires non-skid surface to pick up small items from  table top); 04/16/22: 5 pegs in 2 min 36 secs; 04/25/22: 1 min 48 sec; 05/21/22: L 1 min 23 sec; 07/04/22: 1 min 14 sec; 08/20/22: Able to complete 4 pegs in 3 min before OT stopped test (declined LUE motor skills since ED visit Friday 08/16/22 d/t adverse reaction to a new med)   Time 12    Period Weeks    Status Revised/On-going     Target Date 10/10/22    OT LONG TERM GOAL #8   Title Pt will increase LUE strength by 1 MM grade in order to hold blow dryer in LUE to dry hair without dropping dryer.   Baseline 04/25/22: Unable to sustain grip and maintain lift of LUE with hand near head without dropping dryer.  Dryer has caused bruising above L eye with previous attempts.  L shoulder flex/abd 3/5, elbow flex/ext 3+/5; 07/04/22: L shoulder flex/abd 3+, elbow and wrist flex/ext 4+   Time 12    Period Weeks    Status New/ ongoing   Target Date 10/10/22        OT LONG TERM GOAL #9   Title Pt will increase L hand dexterity to enable pt to independently manage clothing fasteners.   Baseline 04/25/22: pt ties shoe laces loosely and requires assist from spouse occasionally.  When typing, pt engages the L hand for ~25% of the task.  Pt avoids wearing jeans d/t inability to manage button or zipper; 07/04/22: no change yet from 04/25/22; 07/18/22: Managed zipper today, but increased time and effort to manage buttons on pants; able to tie shoes loosely with increased time; 08/20/23: better efficiency with managing zipper on a jacket; pt manages buttons 1 handed (R); able to tie shoes without laces being loose   Time 12    Period Weeks    Status Ongoing   Target Date 10/10/22        OT LONG TERM GOAL #10   Title Pt will be able to independently carry a light plate of food or drink in L hand without spilling/dropping to increase efficiency with item transport in the kitchen.   Baseline 04/25/22: Pt requires constant cues to keep cup or plate level while multitasking in order to prevent spilling; 07/04/22: Pt reports she is carrying her coffee cup from kitchen to neighboring room (not yet consistent to avoid spills); 08/20/22: Pt has not been using L hand to carry items much since Friday d/t increased weakness since ED visit.   Time 12    Period Weeks    Status New/ongoing   Target Date 10/10/22     Plan -     Clinical Impression Statement Pt  seen for 120th visit progress update.  Pt presents today with increased lethargy compared to baseline, after having an ED visit on Friday 08/16/22 as a result of AMS which was presumed to be from new med Baclofen which pt was given for her neck pain/arthritis.  CT scan showed no evidence of new stroke.  Pt has since stopped the Baclofen, but does report residual lethargy and weakness.  L hand strength measures showed no significant change, but significantly reduced FMC/dexterity noted in the L hand this date.  Pt was unable to fully complete the 9 hole peg test, with OT stopping clock at 3 min after pt had placed 4 pegs.  Approximately 1 month ago, pt was able to complete 9 hole peg test in its entirety in 1 min 8 sec with L hand.  Will continue to monitor  unresolved weakness and changes from baseline over the next week.  Pt will continue to work towards goals in plan of care to improve left UE functional use in ADL and IADL tasks at home and in the community.    OT Occupational Profile and History Detailed Assessment- Review of Records and additional review of physical, cognitive, psychosocial history related to current functional performance    Occupational performance deficits (Please refer to evaluation for details): ADL's;IADL's;Leisure;Rest and Sleep    Body Structure / Function / Physical Skills ADL;Coordination;Endurance;GMC;UE functional use;Balance;IADL;Pain;Dexterity;FMC;Strength;Edema;Mobility;ROM    Psychosocial Skills Environmental  Adaptations;Habits;Routines and Behaviors    Rehab Potential Good    Clinical Decision Making Several treatment options, min-mod task modification necessary   Comorbidities Affecting Occupational Performance: May have comorbidities impacting occupational performance    Modification or Assistance to Complete Evaluation  Min-Moderate modification of tasks or assist with assess necessary to complete eval    OT Frequency 2x / week    OT Duration 12 weeks    OT  Treatment/Interventions Self-care/ADL training;Cryotherapy;Paraffin;Therapeutic exercise;DME and/or AE instruction;Functional Mobility Training;Balance training;Electrical Stimulation;Ultrasound;Neuromuscular education;Manual Therapy;Splinting;Moist Heat;Contrast Bath;Passive range of motion;Therapeutic activities;Patient/family education;Coping strategies training    Plan OT recert    Consulted and Agree with Plan of Care Patient           Danelle Earthly, MS, OTR/L  [employer] Dials, OT 08/20/2022, 1:08 PM

## 2022-08-20 NOTE — Therapy (Signed)
OUTPATIENT PHYSICAL THERAPY NEURO TREATMENT/RECERT   Patient Name: Teresa [name] MRN: [id number] DOB:March 18, 1941, 82 y.o., female Today's Date: 08/20/2022   PCP: Dr. Aram Beecham REFERRING PROVIDER: Dr. Aram Beecham  END OF SESSION:  PT End of Session - 08/20/22 1021     Visit Number 19    Number of Visits 41    Date for PT Re-Evaluation 11/05/22    Authorization Type [employer] PPO-    Authorization Time Period 05/23/2022- 08/15/2022    Progress Note Due on Visit 10    PT Start Time 1020    PT Stop Time 1100    PT Time Calculation (min) 40 min    Equipment Utilized During Treatment Gait belt    Activity Tolerance Patient tolerated treatment well;No increased pain    Behavior During Therapy WFL for tasks assessed/performed                        Past Medical History:  Diagnosis Date   Cancer (HCC)    skin   Hypothyroidism    Raynaud's disease    Stroke [facility])    Past Surgical History:  Procedure Laterality Date   ABDOMINAL HYSTERECTOMY  1991   APPENDECTOMY  1991   BACK SURGERY  2010   Cervical fusion C4-5-6-7   CATARACT EXTRACTION, BILATERAL Bilateral 10/2016   CHOLECYSTECTOMY  1995   COLONOSCOPY WITH PROPOFOL N/A 12/08/2018   Procedure: COLONOSCOPY WITH PROPOFOL;  Surgeon: Toledo, Boykin Nearing, MD;  Location: [facility] ENDOSCOPY;  Service: Gastroenterology;  Laterality: N/A;   EYE SURGERY     Patient Active Problem List   Diagnosis Date Noted   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   Stroke (HCC) 02/27/2021   History of nonmelanoma skin cancer 05/23/2014    ONSET DATE: 04/16/2022  REFERRING DIAG: 69.354 (ICD-10-CM) - Hemiplegia and hemiparesis following cerebral infarction affecting left non-dominant side   THERAPY DIAG:  Muscle weakness (generalized)  Right middle cerebral artery stroke (HCC)  Unsteadiness on feet  Other lack of coordination  Difficulty in walking, not elsewhere classified  Other abnormalities of  gait and mobility  Apraxia  Pain in right leg  Rationale for Evaluation and Treatment: Rehabilitation  SUBJECTIVE:                                                                                                                                                                                             SUBJECTIVE STATEMENT: Patient that on the  5/24, she became extremely lethargic after 2 days of taking Baclofen. Taken to hospital with change in mental status and extreme  lethargy.   CT was performed with with no acute abnormalities.  Symptoms determined to be adverse reaction to medication.  Patient had discontinued baclofen.  Patient states that her left arm and left leg still feel weak compared to the event.  She has also noticed new and worsening facial droop facial droop on the left side.  As well as increased fatigue and foot drag on the left lower extremity.  Patient reports appointment with Dr. Judithann Sheen this Thursday.   Pt accompanied by: self  PERTINENT HISTORY: Pt is a 82 y.o. female with referral for hemiplegia with left sided weakness -original CVA on 02/26/21 and new onset of left sided weakness on 04/16/2022.  R MCA CVA on 02/26/21. Pt completed outpatient PT on 01/17/2022 with majority of goals met. PMH includes: skin cancer, hypothyroidism, and Raynaud's disease, history of cervical fusion in 2010 C4-C7.   PAIN:  Are you having pain? No  PRECAUTIONS: Fall  WEIGHT BEARING RESTRICTIONS: No  FALLS: Has patient fallen in last 6 months? No  LIVING ENVIRONMENT: Lives with: lives with their spouse Lives in: House/apartment Stairs: Yes: External: 1 steps; none Has following equipment at home: Single point cane, Walker - 2 wheeled, shower chair, and Grab bars  PLOF: Independent  PATIENT GOALS: I want to walk without this cane and no falls and enjoy going out to grandkids sporting event  OBJECTIVE:   DIAGNOSTIC FINDINGS:   5/24:  EXAM: CT HEAD WITHOUT  CONTRAST  IMPRESSION: 1. No evidence of acute intracranial abnormality. 2. Remote right MCA territory infarct.     CLINICAL DATA:  Stroke suspected   EXAM: MRI HEAD WITHOUT CONTRAST   TECHNIQUE: Multiplanar, multiecho pulse sequences of the brain and surrounding structures were obtained without intravenous contrast.   COMPARISON:  MRI Head 02/26/21, CT Head 04/16/22   FINDINGS: Brain: Evolving right MCA territory infarct involving the right frontal operculum in the right insular cortex. No acute infarction, hemorrhage, hydrocephalus, extra-axial collection or mass lesion. Small focus of microhemorrhage in the left temporal lobe. There is petechial hemorrhage and/or hemosiderosis in the region of prior right MCA territory infarct. T2/stir hyperintense signal in the right cerebral peduncle is favored to represent transneuronal degeneration.   Vascular: Major flow voids are preserved.   Skull and upper cervical spine: Normal marrow signal.   Sinuses/Orbits: Bilateral lens replacement. No mastoid or middle ear effusion. Paranasal sinuses are clear.   Other: None.   IMPRESSION: 1. No acute intracranial abnormality. 2. 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 3.     Electronically Signed   By: Lorenza Cambridge M.D.   On: 04/16/2022 13:41  COGNITION: Overall cognitive status: Within functional limits for tasks assessed   SENSATION: WFL  COORDINATION: Intact with each LE  EDEMA:  None observed  MUSCLE TONE: LLE: Within functional limits  DTRs:  Patella 2+ = Normal and Achilles 2+ = Normal  POSTURE: rounded shoulders and forward head  LOWER EXTREMITY ROM:     Active  Right Eval Left Eval  Hip flexion WNL THROUGHOUT LE WNL THROUGHOUT LE  Hip extension    Hip abduction    Hip adduction    Hip internal rotation    Hip external rotation    Knee flexion    Knee extension    Ankle dorsiflexion     Ankle plantarflexion    Ankle inversion    Ankle eversion     (Blank rows = not tested)  LOWER EXTREMITY  MMT:    MMT Right Eval Left Eval  Hip flexion 4+ 4  Hip extension 4+ 4  Hip abduction 4+ 4  Hip adduction 4+ 4  Hip internal rotation 4+ 4  Hip external rotation 4+ 4  Knee flexion 4+ 4  Knee extension 5 4  Ankle dorsiflexion 5 4  Ankle plantarflexion    Ankle inversion    Ankle eversion    (Blank rows = not tested)  BED MOBILITY:  Patient reports independent with all bed mobility and states has returned to master bed  TRANSFERS: Assistive device utilized: None  Sit to stand: Complete Independence Stand to sit: Complete Independence Chair to chair: Complete Independence Floor:  Not tested   GAIT: Gait pattern: step through pattern, decreased arm swing- Right, decreased arm swing- Left, decreased step length- Right, and decreased step length- Left Distance walked: 915 feet Assistive device utilized: None Level of assistance: SBA Comments: occasional scuff or decreased left foot clearance  FUNCTIONAL TESTS:  5 times sit to stand: 23.62 sec without UE support Timed up and go (TUG): 20.14 sec without an AD 10 meter walk test: 0.70 m/s  PATIENT SURVEYS:  FOTO 60/ goal of 66  TODAY'S TREATMENT:                                                                                                                              DATE: 08/20/22    Gait belt donned and CGA provided unless otherwise specified  Gait training without AD 2x 129ft with noted, shoulder droop and foot slap on the L side with fatigue.  Tactile cues for improved posture and pelvic rotation.  BP assessed:  Sitting: 154/82(100) HR 68 Standing: 0 min.  914/78(29) HR 72 Standing: 1 minute.  131/80 (86) HR 72   Foot tap on 4inch step x 6 bil.  Step up 4" step  x 5 RLE, x 4 LLE. Mild pain in the L knee noted with step down.  Noted decreased eccentric control with the left lower extremity.  Seated  NMR:  LAQ x 12  Ankle DF x 12 Reciprocal hip flexion x 12  Hip abduction manual resistance x 12  Decreased range of motion on the left lower extremity for hip flexion and hip abduction.  Additional gait training to OT treatment session x 65ft. Pt reports feeling that eye was drooping more and L hand was feeling weaker.  Vital signs assessed. BP:  172/69. HR 68.    PATIENT EDUCATION: Education details: Pt educated throughout session about proper posture and technique with exercises. Improved exercise technique, movement at target joints, use of target muscles after min to mod verbal, visual, tactile cues.  Person educated: Patient Education method: Explanation, Demonstration, Tactile cues, and Verbal cues Education comprehension: verbalized understanding, returned demonstration, verbal cues required, tactile cues required, and needs further education  HOME EXERCISE PROGRAM: To be updated next 1-2 visit  GOALS: Goals reviewed with patient? Yes  SHORT  TERM GOALS: Target date: 07/04/2022  Patient will be independent in home exercise program to improve strength/mobility for better functional independence with ADLs  Baseline: EVAL: Patient reports mostly just walking; 07/09/22: Pt reports her exercises are going well, to be updated future date Goal status: IN PROGRESS  LONG TERM GOALS: Target date: 11/05/2022    Pt will decrease 5TSTS by at least 6 seconds in order to demonstrate clinically significant improvement in LE strength. Baseline: EVAL= 23.62 sec without UE; 07/09/22:  13 seconds hands-free  Goal status: MET  2. Pt will improve FOTO to target score of 66 to display perceived improvements in ability to complete ADL's.  Baseline: Eval= 60; 07/09/22: 65 5/21: 59% Goal status: PARTIALLY MET  3.  Pt will decrease TUG to below 14 seconds/decrease in order to demonstrate decreased fall risk. Baseline: Eval= 20.14 sec without AD; 07/09/22: 11 seconds no AD Goal status: MET  4.   Patient will increase six minute walk test distance to >1200 ft for improve gait ability and return to PLOF to complete her walks in her neighborhood.  Baseline: EVAL= 915 feet without an AD; 07/09/22: 1184 ft without AD 5/21: 1060 ft  Goal status: Partially MET  5. Pt will increase by at least 0.13 m/s in order to demonstrate clinically significant improvement in community ambulation.    Baseline: Eval = 0.70 m/s; 07/09/2022: 1.04 m/s without AD   Goal status: MET   6. Pt will report that she has not hit LLE/LUE on doorframe/walls/furniture while ambulating in the past two weeks to indicate improved ability to scan environment and to decrease injury risk with gait.  Baseline: currently has hit furniture/doorframe 2x/week 5/21: hitting doorframe less frequently.   Goal status: Ongoing  7. Patient will increase Functional Gait Assessment score to >20/30 as to reduce fall risk and improve dynamic gait safety with community ambulation.  Baseline:5/21: 13/30  Goal status: NEW   ASSESSMENT:  CLINICAL IMPRESSION: Patient tolerates PT treatment well on this day but, patient exhibits mild regression in functional status with recent hospitalization. Noted increased left lower extremity weakness decreased postural control decreased left upper extremity strength as well as increased facial droop.  Mildly elevated blood pressure noted intermittently throughout session. Will continue to monitor new weakness and signs of stroke. Patient frustrated and regression since prior therapy session.  The pt will benefit from skilled PT services to improve her overall left LE muscle strength and improve her mobility to decrease her fall risk and improve her overall quality of life.   OBJECTIVE IMPAIRMENTS: Abnormal gait, decreased activity tolerance, decreased balance, decreased coordination, decreased endurance, decreased mobility, difficulty walking, and decreased strength.   ACTIVITY LIMITATIONS: carrying,  lifting, bending, standing, squatting, stairs, and transfers  PARTICIPATION LIMITATIONS: cleaning, laundry, driving, shopping, community activity, and yard work  PERSONAL FACTORS: 3+ comorbidities: OA, HTN, hx of previous CVA  are also affecting patient's functional outcome.   REHAB POTENTIAL: Good  CLINICAL DECISION MAKING: Evolving/moderate complexity  EVALUATION COMPLEXITY: Moderate  PLAN:  PT FREQUENCY: 1-2x/week  PT DURATION: 12 weeks  PLANNED INTERVENTIONS: Therapeutic exercises, Therapeutic activity, Neuromuscular re-education, Balance training, Gait training, Patient/Family education, Self Care, Joint mobilization, Joint manipulation, Stair training, Vestibular training, Canalith repositioning, DME instructions, Dry Needling, Electrical stimulation, Spinal manipulation, Spinal mobilization, Cryotherapy, Moist heat, Taping, and Manual therapy  PLAN FOR NEXT SESSION:   Progressive balance training and LE strengthening and add to HEP as appropriate next session. Monitor BP and continue to assess for worsening s/s of  stroke.    Note: Portions of this document were prepared using Dragon voice recognition software and although reviewed may contain unintentional dictation errors in syntax, grammar, or spelling.  Golden Pop PT  Physical Therapist- [place]  [facility]  08/20/2022, 1:45 PM

## 2022-08-22 ENCOUNTER — Ambulatory Visit: Payer: Medicare HMO

## 2022-08-22 DIAGNOSIS — R5383 Other fatigue: Secondary | ICD-10-CM | POA: Diagnosis not present

## 2022-08-22 DIAGNOSIS — R5381 Other malaise: Secondary | ICD-10-CM | POA: Diagnosis not present

## 2022-08-22 DIAGNOSIS — I69354 Hemiplegia and hemiparesis following cerebral infarction affecting left non-dominant side: Secondary | ICD-10-CM | POA: Diagnosis not present

## 2022-08-23 ENCOUNTER — Ambulatory Visit: Payer: Medicare HMO

## 2022-08-23 DIAGNOSIS — R482 Apraxia: Secondary | ICD-10-CM | POA: Diagnosis not present

## 2022-08-23 DIAGNOSIS — R2681 Unsteadiness on feet: Secondary | ICD-10-CM

## 2022-08-23 DIAGNOSIS — I63511 Cerebral infarction due to unspecified occlusion or stenosis of right middle cerebral artery: Secondary | ICD-10-CM | POA: Diagnosis not present

## 2022-08-23 DIAGNOSIS — R2689 Other abnormalities of gait and mobility: Secondary | ICD-10-CM

## 2022-08-23 DIAGNOSIS — R278 Other lack of coordination: Secondary | ICD-10-CM | POA: Diagnosis not present

## 2022-08-23 DIAGNOSIS — M6281 Muscle weakness (generalized): Secondary | ICD-10-CM

## 2022-08-23 DIAGNOSIS — R262 Difficulty in walking, not elsewhere classified: Secondary | ICD-10-CM | POA: Diagnosis not present

## 2022-08-23 DIAGNOSIS — M79604 Pain in right leg: Secondary | ICD-10-CM | POA: Diagnosis not present

## 2022-08-23 NOTE — Therapy (Signed)
OUTPATIENT PHYSICAL THERAPY NEURO TREATMENT/Physical Therapy Progress Note   Dates of reporting period  07/09/2022   to   08/23/2022  Patient Name: Teresa [name] MRN: [id number] DOB:September 21, 1940, 82 y.o., female Today's Date: 08/23/2022   PCP: Dr. Aram Beecham REFERRING PROVIDER: Dr. Aram Beecham  END OF SESSION:  PT End of Session - 08/23/22 0941     Visit Number 20    Number of Visits 41    Date for PT Re-Evaluation 11/05/22    Authorization Type [employer] PPO-    Authorization Time Period 05/23/2022- 08/15/2022    Progress Note Due on Visit 10    PT Start Time 0930    PT Stop Time 1012    PT Time Calculation (min) 42 min    Equipment Utilized During Treatment Gait belt    Activity Tolerance Patient tolerated treatment well;No increased pain    Behavior During Therapy WFL for tasks assessed/performed                         Past Medical History:  Diagnosis Date   Cancer (HCC)    skin   Hypothyroidism    Raynaud's disease    Stroke [facility])    Past Surgical History:  Procedure Laterality Date   ABDOMINAL HYSTERECTOMY  1991   APPENDECTOMY  1991   BACK SURGERY  2010   Cervical fusion C4-5-6-7   CATARACT EXTRACTION, BILATERAL Bilateral 10/2016   CHOLECYSTECTOMY  1995   COLONOSCOPY WITH PROPOFOL N/A 12/08/2018   Procedure: COLONOSCOPY WITH PROPOFOL;  Surgeon: Toledo, Boykin Nearing, MD;  Location: [facility] ENDOSCOPY;  Service: Gastroenterology;  Laterality: N/A;   EYE SURGERY     Patient Active Problem List   Diagnosis Date Noted   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   Stroke (HCC) 02/27/2021   History of nonmelanoma skin cancer 05/23/2014    ONSET DATE: 04/16/2022  REFERRING DIAG: 69.354 (ICD-10-CM) - Hemiplegia and hemiparesis following cerebral infarction affecting left non-dominant side   THERAPY DIAG:  Muscle weakness (generalized)  Right middle cerebral artery stroke (HCC)  Unsteadiness on feet  Other lack of  coordination  Difficulty in walking, not elsewhere classified  Other abnormalities of gait and mobility  Rationale for Evaluation and Treatment: Rehabilitation  SUBJECTIVE:                                                                                                                                                                                             SUBJECTIVE STATEMENT: Patient reports slowly feeling better and more like herself. She denied any falls but did endorse  some fatigue.      Pt accompanied by: self  PERTINENT HISTORY: Pt is a 82 y.o. female with referral for hemiplegia with left sided weakness -original CVA on 02/26/21 and new onset of left sided weakness on 04/16/2022.  R MCA CVA on 02/26/21. Pt completed outpatient PT on 01/17/2022 with majority of goals met. PMH includes: skin cancer, hypothyroidism, and Raynaud's disease, history of cervical fusion in 2010 C4-C7.   PAIN:  Are you having pain? No  PRECAUTIONS: Fall  WEIGHT BEARING RESTRICTIONS: No  FALLS: Has patient fallen in last 6 months? No  LIVING ENVIRONMENT: Lives with: lives with their spouse Lives in: House/apartment Stairs: Yes: External: 1 steps; none Has following equipment at home: Single point cane, Walker - 2 wheeled, shower chair, and Grab bars  PLOF: Independent  PATIENT GOALS: I want to walk without this cane and no falls and enjoy going out to grandkids sporting event  OBJECTIVE:   DIAGNOSTIC FINDINGS:   5/24:  EXAM: CT HEAD WITHOUT CONTRAST  IMPRESSION: 1. No evidence of acute intracranial abnormality. 2. Remote right MCA territory infarct.     CLINICAL DATA:  Stroke suspected   EXAM: MRI HEAD WITHOUT CONTRAST   TECHNIQUE: Multiplanar, multiecho pulse sequences of the brain and surrounding structures were obtained without intravenous contrast.   COMPARISON:  MRI Head 02/26/21, CT Head 04/16/22   FINDINGS: Brain: Evolving right MCA territory infarct involving the  right frontal operculum in the right insular cortex. No acute infarction, hemorrhage, hydrocephalus, extra-axial collection or mass lesion. Small focus of microhemorrhage in the left temporal lobe. There is petechial hemorrhage and/or hemosiderosis in the region of prior right MCA territory infarct. T2/stir hyperintense signal in the right cerebral peduncle is favored to represent transneuronal degeneration.   Vascular: Major flow voids are preserved.   Skull and upper cervical spine: Normal marrow signal.   Sinuses/Orbits: Bilateral lens replacement. No mastoid or middle ear effusion. Paranasal sinuses are clear.   Other: None.   IMPRESSION: 1. No acute intracranial abnormality. 2. 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 3.     Electronically Signed   By: Lorenza Cambridge M.D.   On: 04/16/2022 13:41  COGNITION: Overall cognitive status: Within functional limits for tasks assessed   SENSATION: WFL  COORDINATION: Intact with each LE  EDEMA:  None observed  MUSCLE TONE: LLE: Within functional limits  DTRs:  Patella 2+ = Normal and Achilles 2+ = Normal  POSTURE: rounded shoulders and forward head  LOWER EXTREMITY ROM:     Active  Right Eval Left Eval  Hip flexion WNL THROUGHOUT LE WNL THROUGHOUT LE  Hip extension    Hip abduction    Hip adduction    Hip internal rotation    Hip external rotation    Knee flexion    Knee extension    Ankle dorsiflexion    Ankle plantarflexion    Ankle inversion    Ankle eversion     (Blank rows = not tested)  LOWER EXTREMITY MMT:    MMT Right Eval Left Eval  Hip flexion 4+ 4  Hip extension 4+ 4  Hip abduction 4+ 4  Hip adduction 4+ 4  Hip internal rotation 4+ 4  Hip external rotation 4+ 4  Knee flexion 4+ 4  Knee extension 5 4  Ankle dorsiflexion 5 4  Ankle plantarflexion    Ankle inversion    Ankle eversion    (Blank rows = not tested)  BED  MOBILITY:  Patient reports independent with all bed mobility and states has returned to master bed  TRANSFERS: Assistive device utilized: None  Sit to stand: Complete Independence Stand to sit: Complete Independence Chair to chair: Complete Independence Floor:  Not tested   GAIT: Gait pattern: step through pattern, decreased arm swing- Right, decreased arm swing- Left, decreased step length- Right, and decreased step length- Left Distance walked: 915 feet Assistive device utilized: None Level of assistance: SBA Comments: occasional scuff or decreased left foot clearance  FUNCTIONAL TESTS:  5 times sit to stand: 23.62 sec without UE support Timed up and go (TUG): 20.14 sec without an AD 10 meter walk test: 0.70 m/s  PATIENT SURVEYS:  FOTO 60/ goal of 66  TODAY'S TREATMENT:                                                                                                                              DATE: 08/23/22    Gait belt donned and CGA provided unless otherwise specified  NMR: Dynamic forward stepping over 4 of 1/2 foam down and back x 10 reps Side step over 1/2 foam roll x 4 in // bars x 10 down and back  Step tap onto appropriate colored cone placed on 6" block- left and right x 8 reps x 2  Single leg stand with reach forward down to block with cones- and pick up x 4 on right LE- using left hand to pick up.  Obstacle walking up/over 4 1/2 foams and up onto 6" block to simulate curbs and up/over yoga block x 30 feet total distance x 4.   Therex:   Sit to stand without UE Support x 15 reps Standing calf raises on 1/2 foam x 20 reps  Standing hip circles - CW/CCW- 10 reps x 2 sets each LE    Physical therapy treatment session today consisted of completing assessment of goals and administration of testing as demonstrated and documented in flow sheet, treatment, and goals section of this note. Addition treatments may be found below.        PATIENT  EDUCATION: Education details: Pt educated throughout session about proper posture and technique with exercises. Improved exercise technique, movement at target joints, use of target muscles after min to mod verbal, visual, tactile cues.  Person educated: Patient Education method: Explanation, Demonstration, Tactile cues, and Verbal cues Education comprehension: verbalized understanding, returned demonstration, verbal cues required, tactile cues required, and needs further education  HOME EXERCISE PROGRAM: To be updated next 1-2 visit  GOALS: Goals reviewed with patient? Yes  SHORT TERM GOALS: Target date: 07/04/2022  Patient will be independent in home exercise program to improve strength/mobility for better functional independence with ADLs  Baseline: EVAL: Patient reports mostly just walking; 07/09/22: Pt reports her exercises are going well, to be updated future date Goal status: IN PROGRESS  LONG TERM GOALS: Target date: 11/05/2022    Pt will decrease 5TSTS by  at least 6 seconds in order to demonstrate clinically significant improvement in LE strength. Baseline: EVAL= 23.62 sec without UE; 07/09/22:  13 seconds hands-free  Goal status: MET  2. Pt will improve FOTO to target score of 66 to display perceived improvements in ability to complete ADL's.  Baseline: Eval= 60; 07/09/22: 65 5/21: 59% Goal status: PARTIALLY MET  3.  Pt will decrease TUG to below 14 seconds/decrease in order to demonstrate decreased fall risk. Baseline: Eval= 20.14 sec without AD; 07/09/22: 11 seconds no AD Goal status: MET  4.  Patient will increase six minute walk test distance to >1200 ft for improve gait ability and return to PLOF to complete her walks in her neighborhood.  Baseline: EVAL= 915 feet without an AD; 07/09/22: 1184 ft without AD 5/21: 1060 ft  Goal status: Partially MET  5. Pt will increase by at least 0.13 m/s in order to demonstrate clinically significant improvement in community  ambulation.    Baseline: Eval = 0.70 m/s; 07/09/2022: 1.04 m/s without AD   Goal status: MET   6. Pt will report that she has not hit LLE/LUE on doorframe/walls/furniture while ambulating in the past two weeks to indicate improved ability to scan environment and to decrease injury risk with gait.  Baseline: currently has hit furniture/doorframe 2x/week 5/21: hitting doorframe less frequently. 08/23/2022- Patient reports no bumping arms into any walls or furniture since last tested on 08/13/2022.   Goal status: Ongoing  7. Patient will increase Functional Gait Assessment score to >20/30 as to reduce fall risk and improve dynamic gait safety with community ambulation.  Baseline:5/21: 13/30  Goal status: NEW   ASSESSMENT:  CLINICAL IMPRESSION: Patient progressing with goal of not bumping her arms/legs into any walls. She performed well overall today including mostly balance activities. Did not assess most remaining goals due to fact that patient was just recerted. She was challenged with any single leg standing activity but demonstrated good ability to step over all obstacles with good foot clearance.  Patient's condition has the potential to improve in response to therapy. Maximum improvement is yet to be obtained. The anticipated improvement is attainable and reasonable in a generally predictable time.  The pt will benefit from skilled PT services to improve her overall left LE muscle strength and improve her mobility to decrease her fall risk and improve her overall quality of life.   OBJECTIVE IMPAIRMENTS: Abnormal gait, decreased activity tolerance, decreased balance, decreased coordination, decreased endurance, decreased mobility, difficulty walking, and decreased strength.   ACTIVITY LIMITATIONS: carrying, lifting, bending, standing, squatting, stairs, and transfers  PARTICIPATION LIMITATIONS: cleaning, laundry, driving, shopping, community activity, and yard work  PERSONAL FACTORS: 3+  comorbidities: OA, HTN, hx of previous CVA  are also affecting patient's functional outcome.   REHAB POTENTIAL: Good  CLINICAL DECISION MAKING: Evolving/moderate complexity  EVALUATION COMPLEXITY: Moderate  PLAN:  PT FREQUENCY: 1-2x/week  PT DURATION: 12 weeks  PLANNED INTERVENTIONS: Therapeutic exercises, Therapeutic activity, Neuromuscular re-education, Balance training, Gait training, Patient/Family education, Self Care, Joint mobilization, Joint manipulation, Stair training, Vestibular training, Canalith repositioning, DME instructions, Dry Needling, Electrical stimulation, Spinal manipulation, Spinal mobilization, Cryotherapy, Moist heat, Taping, and Manual therapy  PLAN FOR NEXT SESSION:   Progressive balance training and LE strengthening and add to HEP as appropriate next session. Monitor BP and continue to assess for worsening s/s of stroke.    Note: Portions of this document were prepared using Dragon voice recognition software and although reviewed may contain unintentional dictation errors in  syntax, grammar, or spelling.  Lenda Kelp PT  Physical Therapist- [place]  [facility]  08/23/2022, 11:30 AM

## 2022-08-23 NOTE — Therapy (Signed)
OUTPATIENT OCCUPATIONAL THERAPY NEURO TREATMENT NOTE        Patient Name: Teresa [name] MRN: [id number] DOB:Sep 25, 1940, 82 y.o., female Today's Date: 05/23/2022  PCP: Dr. Aram Beecham REFERRING PROVIDER: Dr. Aram Beecham   OT End of Session - 08/23/22 2110     Visit Number [id number]    Number of Visits 138    Date for OT Re-Evaluation 10/10/22    Authorization Time Period Reporting period beginning 08/20/22    Progress Note Due on Visit 10    OT Start Time 1015    OT Stop Time 1100    OT Time Calculation (min) 45 min    Equipment Utilized During Treatment cane    Activity Tolerance Patient limited by lethargy            Past Medical History:  Diagnosis Date   Cancer (HCC)    skin   Hypothyroidism    Raynaud's disease    Stroke [facility])    Past Surgical History:  Procedure Laterality Date   ABDOMINAL HYSTERECTOMY  1991   APPENDECTOMY  1991   BACK SURGERY  2010   Cervical fusion C4-5-6-7   CATARACT EXTRACTION, BILATERAL Bilateral 10/2016   CHOLECYSTECTOMY  1995   COLONOSCOPY WITH PROPOFOL N/A 12/08/2018   Procedure: COLONOSCOPY WITH PROPOFOL;  Surgeon: Toledo, Boykin Nearing, MD;  Location: [facility] ENDOSCOPY;  Service: Gastroenterology;  Laterality: N/A;   EYE SURGERY     Patient Active Problem List   Diagnosis Date Noted   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  Following inpt rehab, pt went home and had home health.   PRECAUTIONS:  fall  SUBJECTIVE: Pt reports still feeling quite lethargic after her ED visit on Friday (see clinical impression for details).  PAIN:  Are you having pain? none  OBJECTIVE:  L grip 6, R grip 41 L lateral pinch 5, R 13 L 3 point pinch 4, R 14  L 9 hole 1 min 43 sec. L shoulder active flexion 0-105, passive 0-125 02/05/22: L grip 13#  L lateral pinch 8#  3 point pinch 5#  L shoulder flex 0-108 03/12/22:  L grip: 12# L lateral pinch: 8# 3 point pinch: 6# L 9 hole: 1 min 3 sec L shoulder flexion 0-110 04/16/22: L grip: 3# L lateral pinch: 4# 3 point pinch: difficulty maintaining 3 point pinch  L 9 hole: 5 pegs in 2 min 36 sec  L shoulder flexion 0-107 BP L 132/79 HR 80 02 sats 96% 04/25/22: L grip: 14#; R grip: 50# L lateral 2#, R lateral 17# 3 point pinch: L 4#, R 14# (Saehan pinch gauge) 9 hole peg test: 1 min, 48 sec L shoulder flex 125 (R shoulder 132 active) 05/21/22: L grip: 15#; R grip: 50# L lateral 8#, R lateral 20# 3 point pinch: L 4#, R 12# (Saehan pinch gauge) 9 hole peg test: 1 min, 23 sec 07/04/22: L grip: 19# L lateral 10# 3 point pinch: L 6# (standard  pinch gauge-not Saehan) 9 hole peg test: 1 min, 14 sec 07/18/22:  L grip 22# L lateral: 9# L 3 point pinch: 6# L 9 hole: 1 min 8 sec 5/228/24: (Feeling sluggish since 08/16/22 after ED visit for medication adverse reaction) L grip: 21# L lateral: 7# L 3 point pinch: 7# (standard pinch gauge) L 9 hole: 4 pegs in 3 min (OT stopped at 3 min).   TODAY'S TREATMENT: Therapeutic Exercise:   Neuro re-ed:    PATIENT EDUCATION: Education details: Reviewed edema management strategies, self edema massage technique, elevation of hand Person educated: pt Education method: explanation, Lobbyist comprehension: verbalized understanding, demonstrated understanding  HOME EXERCISE PROGRAM Continue to engage LUE into ADLs; continue use of putty for gripping and pinching exercises for LUE, and L shoulder  AROM/AAROM, crocheting, typing; increase participation in IADL tasks for greater use of L hand.    OT Short Term Goals - 08/29/22 (6 weeks)      OT SHORT TERM GOAL #1   Title Pt will be independent with home exercise program.    Baseline Eval: no current program, 10th visit:  continue to add new exercises as pt progresses, 20th:  continue to update HEP; 08/17/21: continue to progress HEP when indicated.  Visit 40: adding new exercises as pt progresses; 12/25/21: ongoing with progressions; 03/12/22: inconsistent use of putty but regular use of stress ball (every other day).  Encouraged pt return to daily putty use (2x daily when able) to target grip and pinch strengthening; 04/16/22: not reviewed today d/t OT sent pt to ED; 04/25/22: added pulleys and pt has re-started putty; 05/21/22: stopped pulleys and added stronger theraputty and encouraged pt crochet often; 07/04/22: pt working consistently with putty and grip strengthener at 20# at home; 07/18/22: pt continues to work on putty and Building surveyor and will be starting with her crocheting again to progress [facility] skills; 08/20/22: Pt continues to use her grip strengthener daily set at 20#; will continue to modify as needed   Time 12    Period Weeks    Status On-going    Target Date 08/29/22     OT LONG TERM GOAL #2   Title Pt will complete UB and LB dressing with modified independence including buttons, snaps and zippers.    Baseline requires min assist at eval, 10th visit: occasional assist with buttons, 20th:  able to perform one handed, but difficulty with bilateral UE; 08/17/21: pt reports inconsistent with 1 hand, reviewed techniques this visit, Visit 40:  Pt requires assist with bra   Time 12    Period Weeks    Status MET   Target Date 11/08/21     OT LONG TERM GOAL #3   Title Pt will perform shower transfer with modified independence.    Baseline Pt requires supervision to min assist for shower transfer at home. 10th visit: supervision; 08/17/21:  supv .  Visit 40:  Pt had met goal but had recent fall and now requiring min guard to supv   Time 6    Period Weeks    Status  Met    Target Date 11/08/21      OT LONG TERM GOAL #4 10/10/22 (12 weeks)    Title Pt will improve L hand grip to 25 or more #s to enable pt to open a new jar.   Baseline no grip in left hand at eval, 10th visit:  improved flexion but still working towards composite fisting and grip. 20th:  continues to demo  decreased grip; 08/17/21: active digit flexion improving, but not yet able to register grip on dynamometer; 09/04/21: L grip 1# 7/17:  5#; 12/25/21: L grip 6#; 02/05/22: 13#; 03/12/22: L grip 12#; 1/23//24: L grip 3# (decreased); 04/25/22: L grip 14#; spouse assists to open new jars; 05/21/22: L grip 15#; 07/04/22: L grip 19#; 07/18/22: L grip 22#, difficulty opening a new jar (R grip 50#); 08/20/22: L grip 21# (increased weakness since Friday after adverse reaction to a new medication resulting in ED visit).   Time 12    Period Weeks    Status Revised/ongoing   Target Date 10/10/22     OT LONG TERM GOAL #5   Title Pt will improve left shoulder flexion to 100 degrees or better to improve reaching to obtain self care items from shelf/shoulder height.    Baseline difficulty with reach, shoulder flexion to 47 degrees; 08/17/21: L shoulder flexion 85, but not yet able to consistently hold ADL supplies in L hand when reaching; 09/04/21: flexion 85 7/17:  shoulder flexion to 90; 12/25/21: 105 (P 125); 03/12/22: active L shoulder flexion 110 (pt uses R arm to reach for items at shoulder height or above); 04/16/22: L shoulder flexion 107; 04/25/22: L shoulder flexion 125*, R 132*    Time 12    Period Weeks    Status achieved   Target Date 04/25/22     OT LONG TERM GOAL #6   Title Pt will improve FOTO score to 47 or above to demonstrate a clinically relevant change in function to impact ADL tasks.    Baseline score of 30 at eval; 08/17/21: FOTO: 42; 09/04/21: FOTO : 54 , FOTO 7/7:  60;  12/25/21: 59; 03/12/22: 66; 04/16/22: 67; 04/25/22; 51; 05/21/22: 74; 07/04/22: 74; 07/18/22: 71; 08/20/22: 77   Time 12    Period Weeks    Status  MET/ongoing    Target Date 10/10/22     OT LONG TERM GOAL #7   Title Pt will demonstrate ability to pick up small objects with L hand and complete 9 hole peg test in less than 1 min (revised)   Baseline unable to perform at eval, 10th visit: still unable to pick up small pegs, 20th: unable to pick up pegs but improving; 08/17/21: not attempted d/t time constraints, will assess next visit; 09/04/21: 9 hole peg test in 5 min 53 sec;  7/17:  Pt unable to complete this date but was able to place 6 of 9 pegs in 4 mins 20 secs; 12/25/21: L 1 min 43 sec; 03/12/22: L 1 min 2 sec (requires non-skid surface to pick up small items from table top); 04/16/22: 5 pegs in 2 min 36 secs; 04/25/22: 1 min 48 sec; 05/21/22: L 1 min 23 sec; 07/04/22: 1 min 14 sec; 08/20/22: Able to complete 4 pegs in 3 min before OT stopped test (declined LUE motor skills since ED visit Friday 08/16/22 d/t adverse reaction to a new med)   Time 12    Period Weeks    Status Revised/On-going    Target Date 10/10/22    OT LONG TERM GOAL #8   Title Pt will increase LUE strength by 1 MM grade in order to hold blow dryer in LUE to dry hair without dropping dryer.   Baseline 04/25/22: Unable to sustain grip and maintain lift of LUE with hand near head without dropping dryer.  Dryer has caused bruising above L eye with previous attempts.  L shoulder flex/abd 3/5, elbow flex/ext 3+/5;  07/04/22: L shoulder flex/abd 3+, elbow and wrist flex/ext 4+   Time 12    Period Weeks    Status New/ ongoing   Target Date 10/10/22        OT LONG TERM GOAL #9   Title Pt will increase L hand dexterity to enable pt to independently manage clothing fasteners.   Baseline 04/25/22: pt ties shoe laces loosely and requires assist from spouse occasionally.  When typing, pt engages the L hand for ~25% of the task.  Pt avoids wearing jeans d/t  inability to manage button or zipper; 07/04/22: no change yet from 04/25/22; 07/18/22: Managed zipper today, but increased time and effort to manage buttons on pants; able to tie shoes loosely with increased time; 08/20/23: better efficiency with managing zipper on a jacket; pt manages buttons 1 handed (R); able to tie shoes without laces being loose   Time 12    Period Weeks    Status Ongoing   Target Date 10/10/22        OT LONG TERM GOAL #10   Title Pt will be able to independently carry a light plate of food or drink in L hand without spilling/dropping to increase efficiency with item transport in the kitchen.   Baseline 04/25/22: Pt requires constant cues to keep cup or plate level while multitasking in order to prevent spilling; 07/04/22: Pt reports she is carrying her coffee cup from kitchen to neighboring room (not yet consistent to avoid spills); 08/20/22: Pt has not been using L hand to carry items much since Friday d/t increased weakness since ED visit.   Time 12    Period Weeks    Status New/ongoing   Target Date 10/10/22     Plan -     Clinical Impression Statement Pt seen for 120th visit progress update.  Pt presents today with increased lethargy compared to baseline, after having an ED visit on Friday 08/16/22 as a result of AMS which was presumed to be from new med Baclofen which pt was given for her neck pain/arthritis.  CT scan showed no evidence of new stroke.  Pt has since stopped the Baclofen, but does report residual lethargy and weakness.  L hand strength measures showed no significant change, but significantly reduced FMC/dexterity noted in the L hand this date.  Pt was unable to fully complete the 9 hole peg test, with OT stopping clock at 3 min after pt had placed 4 pegs.  Approximately 1 month ago, pt was able to complete 9 hole peg test in its entirety in 1 min 8 sec with L hand.  Will continue to monitor unresolved weakness and changes from baseline over the next week.  Pt will  continue to work towards goals in plan of care to improve left UE functional use in ADL and IADL tasks at home and in the community.    OT Occupational Profile and History Detailed Assessment- Review of Records and additional review of physical, cognitive, psychosocial history related to current functional performance    Occupational performance deficits (Please refer to evaluation for details): ADL's;IADL's;Leisure;Rest and Sleep    Body Structure / Function / Physical Skills ADL;Coordination;Endurance;GMC;UE functional use;Balance;IADL;Pain;Dexterity;FMC;Strength;Edema;Mobility;ROM    Psychosocial Skills Environmental  Adaptations;Habits;Routines and Behaviors    Rehab Potential Good    Clinical Decision Making Several treatment options, min-mod task modification necessary   Comorbidities Affecting Occupational Performance: May have comorbidities impacting occupational performance    Modification or Assistance to Complete Evaluation  Min-Moderate modification of tasks  or assist with assess necessary to complete eval    OT Frequency 2x / week    OT Duration 12 weeks    OT Treatment/Interventions Self-care/ADL training;Cryotherapy;Paraffin;Therapeutic exercise;DME and/or AE instruction;Functional Mobility Training;Balance training;Electrical Stimulation;Ultrasound;Neuromuscular education;Manual Therapy;Splinting;Moist Heat;Contrast Bath;Passive range of motion;Therapeutic activities;Patient/family education;Coping strategies training    Plan OT recert    Consulted and Agree with Plan of Care Patient           Danelle Earthly, MS, OTR/L  [employer] Dials, OT 08/23/2022, 9:13 PM

## 2022-08-26 NOTE — Therapy (Signed)
OUTPATIENT PHYSICAL THERAPY NEURO TREATMENT/Physical Therapy Treatment Note   Dates of reporting period  07/09/2022   to   08/23/2022  Patient Name: Teresa [name] MRN: [id number] DOB:[birth year], 81 y.o., female Today's Date: 08/23/2022   PCP: Dr. Aram Beecham REFERRING PROVIDER: Dr. Aram Beecham  END OF SESSION:  PT End of Session - 08/23/22 0941     Visit Number 20    Number of Visits 41    Date for PT Re-Evaluation 11/05/22    Authorization Type [employer] PPO-    Authorization Time Period 05/23/2022- 08/15/2022    Progress Note Due on Visit 10    PT Start Time 0930    PT Stop Time 1012    PT Time Calculation (min) 42 min    Equipment Utilized During Treatment Gait belt    Activity Tolerance Patient tolerated treatment well;No increased pain    Behavior During Therapy WFL for tasks assessed/performed                         Past Medical History:  Diagnosis Date   Cancer (HCC)    skin   Hypothyroidism    Raynaud's disease    Stroke [facility])    Past Surgical History:  Procedure Laterality Date   ABDOMINAL HYSTERECTOMY  1991   APPENDECTOMY  1991   BACK SURGERY  2010   Cervical fusion C4-5-6-7   CATARACT EXTRACTION, BILATERAL Bilateral 10/2016   CHOLECYSTECTOMY  1995   COLONOSCOPY WITH PROPOFOL N/A 12/08/2018   Procedure: COLONOSCOPY WITH PROPOFOL;  Surgeon: Toledo, Boykin Nearing, MD;  Location: [facility] ENDOSCOPY;  Service: Gastroenterology;  Laterality: N/A;   EYE SURGERY     Patient Active Problem List   Diagnosis Date Noted   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   Stroke (HCC) 02/27/2021   History of nonmelanoma skin cancer 05/23/2014    ONSET DATE: 04/16/2022  REFERRING DIAG: 69.354 (ICD-10-CM) - Hemiplegia and hemiparesis following cerebral infarction affecting left non-dominant side   THERAPY DIAG:  Muscle weakness (generalized)  Right middle cerebral artery stroke (HCC)  Unsteadiness on feet  Other lack of  coordination  Difficulty in walking, not elsewhere classified  Other abnormalities of gait and mobility  Rationale for Evaluation and Treatment: Rehabilitation  SUBJECTIVE:                                                                                                                                                                                             SUBJECTIVE STATEMENT: ***      Pt accompanied by: self  PERTINENT HISTORY: Pt is a  82 y.o. female with referral for hemiplegia with left sided weakness -original CVA on 02/26/21 and new onset of left sided weakness on 04/16/2022.  R MCA CVA on 02/26/21. Pt completed outpatient PT on 01/17/2022 with majority of goals met. PMH includes: skin cancer, hypothyroidism, and Raynaud's disease, history of cervical fusion in 2010 C4-C7.   PAIN:  Are you having pain? ***  PRECAUTIONS: Fall  WEIGHT BEARING RESTRICTIONS: No  FALLS: Has patient fallen in last 6 months? No  LIVING ENVIRONMENT: Lives with: lives with their spouse Lives in: House/apartment Stairs: Yes: External: 1 steps; none Has following equipment at home: Single point cane, Walker - 2 wheeled, shower chair, and Grab bars  PLOF: Independent  PATIENT GOALS: I want to walk without this cane and no falls and enjoy going out to grandkids sporting event  OBJECTIVE:   DIAGNOSTIC FINDINGS:   5/24:  EXAM: CT HEAD WITHOUT CONTRAST  IMPRESSION: 1. No evidence of acute intracranial abnormality. 2. Remote right MCA territory infarct.     CLINICAL DATA:  Stroke suspected   EXAM: MRI HEAD WITHOUT CONTRAST   TECHNIQUE: Multiplanar, multiecho pulse sequences of the brain and surrounding structures were obtained without intravenous contrast.   COMPARISON:  MRI Head 02/26/21, CT Head 04/16/22   FINDINGS: Brain: Evolving right MCA territory infarct involving the right frontal operculum in the right insular cortex. No acute infarction, hemorrhage, hydrocephalus,  extra-axial collection or mass lesion. Small focus of microhemorrhage in the left temporal lobe. There is petechial hemorrhage and/or hemosiderosis in the region of prior right MCA territory infarct. T2/stir hyperintense signal in the right cerebral peduncle is favored to represent transneuronal degeneration.   Vascular: Major flow voids are preserved.   Skull and upper cervical spine: Normal marrow signal.   Sinuses/Orbits: Bilateral lens replacement. No mastoid or middle ear effusion. Paranasal sinuses are clear.   Other: None.   IMPRESSION: 1. No acute intracranial abnormality. 2. 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 3.     Electronically Signed   By: Lorenza Cambridge M.D.   On: 04/16/2022 13:41  COGNITION: Overall cognitive status: Within functional limits for tasks assessed   SENSATION: WFL  COORDINATION: Intact with each LE  EDEMA:  None observed  MUSCLE TONE: LLE: Within functional limits  DTRs:  Patella 2+ = Normal and Achilles 2+ = Normal  POSTURE: rounded shoulders and forward head  LOWER EXTREMITY ROM:     Active  Right Eval Left Eval  Hip flexion WNL THROUGHOUT LE WNL THROUGHOUT LE  Hip extension    Hip abduction    Hip adduction    Hip internal rotation    Hip external rotation    Knee flexion    Knee extension    Ankle dorsiflexion    Ankle plantarflexion    Ankle inversion    Ankle eversion     (Blank rows = not tested)  LOWER EXTREMITY MMT:    MMT Right Eval Left Eval  Hip flexion 4+ 4  Hip extension 4+ 4  Hip abduction 4+ 4  Hip adduction 4+ 4  Hip internal rotation 4+ 4  Hip external rotation 4+ 4  Knee flexion 4+ 4  Knee extension 5 4  Ankle dorsiflexion 5 4  Ankle plantarflexion    Ankle inversion    Ankle eversion    (Blank rows = not tested)  BED MOBILITY:  Patient reports independent with all bed mobility and states has returned to Child psychotherapist  bed  TRANSFERS: Assistive device utilized: None  Sit to stand: Complete Independence Stand to sit: Complete Independence Chair to chair: Complete Independence Floor:  Not tested   GAIT: Gait pattern: step through pattern, decreased arm swing- Right, decreased arm swing- Left, decreased step length- Right, and decreased step length- Left Distance walked: 915 feet Assistive device utilized: None Level of assistance: SBA Comments: occasional scuff or decreased left foot clearance  FUNCTIONAL TESTS:  5 times sit to stand: 23.62 sec without UE support Timed up and go (TUG): 20.14 sec without an AD 10 meter walk test: 0.70 m/s  PATIENT SURVEYS:  FOTO 60/ goal of 66  TODAY'S TREATMENT:                                                                                                                              DATE: 08/23/22  TherEx: -*** x ***:   TherAct: - *** x ***:  NMRE:  - *** x ***:  Manual Therapy:  - *** x ***:  Gait Training:  - *** x ***:      PATIENT EDUCATION: Education details: Pt educated throughout session about proper posture and technique with exercises. Improved exercise technique, movement at target joints, use of target muscles after min to mod verbal, visual, tactile cues.  Person educated: Patient Education method: Explanation, Demonstration, Tactile cues, and Verbal cues Education comprehension: verbalized understanding, returned demonstration, verbal cues required, tactile cues required, and needs further education  HOME EXERCISE PROGRAM: To be updated next 1-2 visit  GOALS: Goals reviewed with patient? Yes  SHORT TERM GOALS: Target date: 07/04/2022  Patient will be independent in home exercise program to improve strength/mobility for better functional independence with ADLs  Baseline: EVAL: Patient reports mostly just walking; 07/09/22: Pt reports her exercises are going well, to be updated future date Goal status: IN PROGRESS  LONG TERM  GOALS: Target date: 11/05/2022    Pt will decrease 5TSTS by at least 6 seconds in order to demonstrate clinically significant improvement in LE strength. Baseline: EVAL= 23.62 sec without UE; 07/09/22:  13 seconds hands-free  Goal status: MET  2. Pt will improve FOTO to target score of 66 to display perceived improvements in ability to complete ADL's.  Baseline: Eval= 60; 07/09/22: 65 5/21: 59% Goal status: PARTIALLY MET  3.  Pt will decrease TUG to below 14 seconds/decrease in order to demonstrate decreased fall risk. Baseline: Eval= 20.14 sec without AD; 07/09/22: 11 seconds no AD Goal status: MET  4.  Patient will increase six minute walk test distance to >1200 ft for improve gait ability and return to PLOF to complete her walks in her neighborhood.  Baseline: EVAL= 915 feet without an AD; 07/09/22: 1184 ft without AD 5/21: 1060 ft  Goal status: Partially MET  5. Pt will increase by at least 0.13 m/s in order to demonstrate clinically significant improvement in community ambulation.    Baseline: Eval = 0.70 m/s; 07/09/2022:  1.04 m/s without AD   Goal status: MET   6. Pt will report that she has not hit LLE/LUE on doorframe/walls/furniture while ambulating in the past two weeks to indicate improved ability to scan environment and to decrease injury risk with gait.  Baseline: currently has hit furniture/doorframe 2x/week 5/21: hitting doorframe less frequently. 08/23/2022- Patient reports no bumping arms into any walls or furniture since last tested on 08/13/2022.   Goal status: Ongoing  7. Patient will increase Functional Gait Assessment score to >20/30 as to reduce fall risk and improve dynamic gait safety with community ambulation.  Baseline:5/21: 13/30  Goal status: NEW   ASSESSMENT:  CLINICAL IMPRESSION: *** The pt will benefit from skilled PT services to improve her overall left LE muscle strength and improve her mobility to decrease her fall risk and improve her overall  quality of life.   OBJECTIVE IMPAIRMENTS: Abnormal gait, decreased activity tolerance, decreased balance, decreased coordination, decreased endurance, decreased mobility, difficulty walking, and decreased strength.   ACTIVITY LIMITATIONS: carrying, lifting, bending, standing, squatting, stairs, and transfers  PARTICIPATION LIMITATIONS: cleaning, laundry, driving, shopping, community activity, and yard work  PERSONAL FACTORS: 3+ comorbidities: OA, HTN, hx of previous CVA  are also affecting patient's functional outcome.   REHAB POTENTIAL: Good  CLINICAL DECISION MAKING: Evolving/moderate complexity  EVALUATION COMPLEXITY: Moderate  PLAN:  PT FREQUENCY: 1-2x/week  PT DURATION: 12 weeks  PLANNED INTERVENTIONS: Therapeutic exercises, Therapeutic activity, Neuromuscular re-education, Balance training, Gait training, Patient/Family education, Self Care, Joint mobilization, Joint manipulation, Stair training, Vestibular training, Canalith repositioning, DME instructions, Dry Needling, Electrical stimulation, Spinal manipulation, Spinal mobilization, Cryotherapy, Moist heat, Taping, and Manual therapy  PLAN FOR NEXT SESSION:   Progressive balance training and LE strengthening and add to HEP as appropriate next session. Monitor BP and continue to assess for worsening s/s of stroke.    Note: Portions of this document were prepared using Dragon voice recognition software and although reviewed may contain unintentional dictation errors in syntax, grammar, or spelling.  Judith Blonder, SPT   [facility]  08/23/2022, 11:30 AM

## 2022-08-27 ENCOUNTER — Ambulatory Visit: Payer: Medicare HMO | Attending: Internal Medicine

## 2022-08-27 ENCOUNTER — Ambulatory Visit: Payer: Medicare HMO

## 2022-08-27 ENCOUNTER — Ambulatory Visit: Payer: Medicare PPO

## 2022-08-27 DIAGNOSIS — R262 Difficulty in walking, not elsewhere classified: Secondary | ICD-10-CM | POA: Diagnosis not present

## 2022-08-27 DIAGNOSIS — M6281 Muscle weakness (generalized): Secondary | ICD-10-CM | POA: Insufficient documentation

## 2022-08-27 DIAGNOSIS — R2681 Unsteadiness on feet: Secondary | ICD-10-CM

## 2022-08-27 DIAGNOSIS — R278 Other lack of coordination: Secondary | ICD-10-CM | POA: Insufficient documentation

## 2022-08-27 DIAGNOSIS — R2689 Other abnormalities of gait and mobility: Secondary | ICD-10-CM | POA: Insufficient documentation

## 2022-08-27 DIAGNOSIS — I63511 Cerebral infarction due to unspecified occlusion or stenosis of right middle cerebral artery: Secondary | ICD-10-CM | POA: Diagnosis not present

## 2022-08-28 NOTE — Therapy (Signed)
OUTPATIENT OCCUPATIONAL THERAPY NEURO TREATMENT NOTE        Patient Name: Teresa [name] MRN: [id number] DOB:12-10-1940, 82 y.o., female Today's Date: 05/23/2022  PCP: Dr. Aram Beecham REFERRING PROVIDER: Dr. Aram Beecham   OT End of Session - 08/28/22 0842     Visit Number [id number]    Number of Visits 138    Date for OT Re-Evaluation 10/10/22    Authorization Time Period Reporting period beginning 08/20/22    Progress Note Due on Visit 10    OT Start Time 1145    OT Stop Time 1230    OT Time Calculation (min) 45 min    Equipment Utilized During Treatment cane    Activity Tolerance Patient tolerated treatment well    Behavior During Therapy WFL for tasks assessed/performed            Past Medical History:  Diagnosis Date   Cancer (HCC)    skin   Hypothyroidism    Raynaud's disease    Stroke [facility])    Past Surgical History:  Procedure Laterality Date   ABDOMINAL HYSTERECTOMY  1991   APPENDECTOMY  1991   BACK SURGERY  2010   Cervical fusion C4-5-6-7   CATARACT EXTRACTION, BILATERAL Bilateral 10/2016   CHOLECYSTECTOMY  1995   COLONOSCOPY WITH PROPOFOL N/A 12/08/2018   Procedure: COLONOSCOPY WITH PROPOFOL;  Surgeon: Toledo, Boykin Nearing, MD;  Location: [facility] ENDOSCOPY;  Service: Gastroenterology;  Laterality: N/A;   EYE SURGERY     Patient Active Problem List   Diagnosis Date Noted   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  Following inpt  rehab, pt went home and had home health.   PRECAUTIONS: fall  SUBJECTIVE: Pt reports L hand is much better re: swelling and rings are more loose.  PAIN:  Are you having pain? none  OBJECTIVE:  L grip 6, R grip 41 L lateral pinch 5, R 13 L 3 point pinch 4, R 14  L 9 hole 1 min 43 sec. L shoulder active flexion 0-105, passive 0-125 02/05/22: L grip 13#  L lateral pinch 8#  3 point pinch 5#  L shoulder flex 0-108 03/12/22:  L grip: 12# L lateral pinch: 8# 3 point pinch: 6# L 9 hole: 1 min 3 sec L shoulder flexion 0-110 04/16/22: L grip: 3# L lateral pinch: 4# 3 point pinch: difficulty maintaining 3 point pinch  L 9 hole: 5 pegs in 2 min 36 sec  L shoulder flexion 0-107 BP L 132/79 HR 80 02 sats 96% 04/25/22: L grip: 14#; R grip: 50# L lateral 2#, R lateral 17# 3 point pinch: L 4#, R 14# (Saehan pinch gauge) 9 hole peg test: 1 min, 48 sec L shoulder flex 125 (R shoulder 132 active) 05/21/22: L grip: 15#; R grip: 50# L lateral 8#, R lateral 20# 3 point pinch: L 4#, R 12# (Saehan pinch gauge) 9 hole peg test: 1 min, 23 sec 07/04/22: L grip: 19# L lateral  10# 3 point pinch: L 6# (standard pinch gauge-not Saehan) 9 hole peg test: 1 min, 14 sec 07/18/22:  L grip 22# L lateral: 9# L 3 point pinch: 6# L 9 hole: 1 min 8 sec 08/20/22: (Feeling sluggish since 08/16/22 after ED visit for medication adverse reaction) L grip: 21# L lateral: 7# L 3 point pinch: 7# (standard pinch gauge) L 9 hole: 4 pegs in 3 min (OT stopped at 3 min).  08/23/22:  L 9 hole: 1 min 12 sec  TODAY'S TREATMENT: Neuro re-ed: Activities provided for increasing proprioceptive awareness throughout the LUE.   -1 lb dumbbell held in L hand with pt cued to make designated positional changes with vision occluded. - Practiced using L hand to position and touch various body parts on L/R sides with vision occluded. -Practiced carrying cup in L hand balancing tennis ball at top of cup.  Pt walked a loop  around clinic (~5 min ambulation) holding conversation for dual tasking.  Pt dropped tennis ball 6x on first loop d/t decreased awareness to tilted cup in L hand.  Attempted a second trial, same loop, and pt able to balance cup in L hand without cues without any dropping.  Pt verbalized that she increased her attention to L hand on second loop.  Participation in L hand FMC/dexterity skills working to pick up [place] stones from table top with and without a non-skid surface, stone flat side up and flat side down.  Pt practiced scooping multiple stones into hand, picking up stones 1 at a time and storing in hand, and discarding stones 1 at a time from palm.  PATIENT EDUCATION: Education details: dual tasking while carrying items in L hand. Person educated: pt Education method: explanation, demo Education comprehension: verbalized understanding, demonstrated understanding, further training needed  HOME EXERCISE PROGRAM Continue to engage LUE into ADLs; continue use of putty for gripping and pinching exercises for LUE, and L shoulder AROM/AAROM, crocheting, typing; increase participation in IADL tasks for greater use of L hand.    OT Short Term Goals - 08/29/22 (6 weeks)      OT SHORT TERM GOAL #1   Title Pt will be independent with home exercise program.    Baseline Eval: no current program, 10th visit:  continue to add new exercises as pt progresses, 20th:  continue to update HEP; 08/17/21: continue to progress HEP when indicated.  Visit 40: adding new exercises as pt progresses; 12/25/21: ongoing with progressions; 03/12/22: inconsistent use of putty but regular use of stress ball (every other day).  Encouraged pt return to daily putty use (2x daily when able) to target grip and pinch strengthening; 04/16/22: not reviewed today d/t OT sent pt to ED; 04/25/22: added pulleys and pt has re-started putty; 05/21/22: stopped pulleys and added stronger theraputty and encouraged pt crochet often; 07/04/22: pt  working consistently with putty and grip strengthener at 20# at home; 07/18/22: pt continues to work on putty and Building surveyor and will be starting with her crocheting again to progress [facility] skills; 08/20/22: Pt continues to use her grip strengthener daily set at 20#; will continue to modify as needed   Time 12    Period Weeks    Status On-going    Target Date 08/29/22     OT LONG TERM GOAL #2   Title Pt will complete UB and LB dressing with modified independence including buttons, snaps and zippers.    Baseline requires min assist at eval, 10th visit: occasional assist with  buttons, 20th:  able to perform one handed, but difficulty with bilateral UE; 08/17/21: pt reports inconsistent with 1 hand, reviewed techniques this visit, Visit 40:  Pt requires assist with bra   Time 12    Period Weeks    Status MET   Target Date 11/08/21     OT LONG TERM GOAL #3   Title Pt will perform shower transfer with modified independence.    Baseline Pt requires supervision to min assist for shower transfer at home. 10th visit: supervision; 08/17/21: supv .  Visit 40:  Pt had met goal but had recent fall and now requiring min guard to supv   Time 6    Period Weeks    Status  Met    Target Date 11/08/21      OT LONG TERM GOAL #4 10/10/22 (12 weeks)    Title Pt will improve L hand grip to 25 or more #s to enable pt to open a new jar.   Baseline no grip in left hand at eval, 10th visit:  improved flexion but still working towards composite fisting and grip. 20th:  continues to demo decreased grip; 08/17/21: active digit flexion improving, but not yet able to register grip on dynamometer; 09/04/21: L grip 1# 7/17:  5#; 12/25/21: L grip 6#; 02/05/22: 13#; 03/12/22: L grip 12#; 1/23//24: L grip 3# (decreased); 04/25/22: L grip 14#; spouse assists to open new jars; 05/21/22: L grip 15#; 07/04/22: L grip 19#; 07/18/22: L grip 22#, difficulty opening a new jar (R grip 50#); 08/20/22: L grip 21# (increased weakness since Friday  after adverse reaction to a new medication resulting in ED visit).   Time 12    Period Weeks    Status Revised/ongoing   Target Date 10/10/22     OT LONG TERM GOAL #5   Title Pt will improve left shoulder flexion to 100 degrees or better to improve reaching to obtain self care items from shelf/shoulder height.    Baseline difficulty with reach, shoulder flexion to 47 degrees; 08/17/21: L shoulder flexion 85, but not yet able to consistently hold ADL supplies in L hand when reaching; 09/04/21: flexion 85 7/17:  shoulder flexion to 90; 12/25/21: 105 (P 125); 03/12/22: active L shoulder flexion 110 (pt uses R arm to reach for items at shoulder height or above); 04/16/22: L shoulder flexion 107; 04/25/22: L shoulder flexion 125*, R 132*    Time 12    Period Weeks    Status achieved   Target Date 04/25/22     OT LONG TERM GOAL #6   Title Pt will improve FOTO score to 47 or above to demonstrate a clinically relevant change in function to impact ADL tasks.    Baseline score of 30 at eval; 08/17/21: FOTO: 42; 09/04/21: FOTO : 54 , FOTO 7/7:  60; 12/25/21: 59; 03/12/22: 66; 04/16/22: 67; 04/25/22; 51; 05/21/22: 74; 07/04/22: 74; 07/18/22: 71; 08/20/22: 77   Time 12    Period Weeks    Status  MET/ongoing    Target Date 10/10/22     OT LONG TERM GOAL #7   Title Pt will demonstrate ability to pick up small objects with L hand and complete 9 hole peg test in less than 1 min (revised)   Baseline unable to perform at eval, 10th visit: still unable to pick up small pegs, 20th: unable to pick up pegs but improving; 08/17/21: not attempted d/t time constraints, will assess next visit; 09/04/21: 9 hole peg test in  5 min 53 sec;  7/17:  Pt unable to complete this date but was able to place 6 of 9 pegs in 4 mins 20 secs; 12/25/21: L 1 min 43 sec; 03/12/22: L 1 min 2 sec (requires non-skid surface to pick up small items from table top); 04/16/22: 5 pegs in 2 min 36 secs; 04/25/22: 1 min 48 sec; 05/21/22: L 1 min 23 sec; 07/04/22: 1 min 14  sec; 08/20/22: Able to complete 4 pegs in 3 min before OT stopped test (declined LUE motor skills since ED visit Friday 08/16/22 d/t adverse reaction to a new med)   Time 12    Period Weeks    Status Revised/On-going    Target Date 10/10/22    OT LONG TERM GOAL #8   Title Pt will increase LUE strength by 1 MM grade in order to hold blow dryer in LUE to dry hair without dropping dryer.   Baseline 04/25/22: Unable to sustain grip and maintain lift of LUE with hand near head without dropping dryer.  Dryer has caused bruising above L eye with previous attempts.  L shoulder flex/abd 3/5, elbow flex/ext 3+/5; 07/04/22: L shoulder flex/abd 3+, elbow and wrist flex/ext 4+   Time 12    Period Weeks    Status New/ ongoing   Target Date 10/10/22        OT LONG TERM GOAL #9   Title Pt will increase L hand dexterity to enable pt to independently manage clothing fasteners.   Baseline 04/25/22: pt ties shoe laces loosely and requires assist from spouse occasionally.  When typing, pt engages the L hand for ~25% of the task.  Pt avoids wearing jeans d/t inability to manage button or zipper; 07/04/22: no change yet from 04/25/22; 07/18/22: Managed zipper today, but increased time and effort to manage buttons on pants; able to tie shoes loosely with increased time; 08/20/23: better efficiency with managing zipper on a jacket; pt manages buttons 1 handed (R); able to tie shoes without laces being loose   Time 12    Period Weeks    Status Ongoing   Target Date 10/10/22        OT LONG TERM GOAL #10   Title Pt will be able to independently carry a light plate of food or drink in L hand without spilling/dropping to increase efficiency with item transport in the kitchen.   Baseline 04/25/22: Pt requires constant cues to keep cup or plate level while multitasking in order to prevent spilling; 07/04/22: Pt reports she is carrying her coffee cup from kitchen to neighboring room (not yet consistent to avoid spills); 08/20/22: Pt has  not been using L hand to carry items much since Friday d/t increased weakness since ED visit.   Time 12    Period Weeks    Status New/ongoing   Target Date 10/10/22     Plan -     Clinical Impression Statement Focus today on LUE proprioceptive awareness with a variety of activities.  Pt demonstrates good ability to make slight positional changes and touch specific body parts with LUE with vision occluded with fairly good accuracy when focused on a single task.  When challenge is increased to include dual tasking, pt remains inconsistent to successfully carry items in L hand without dropping d/t decreased awareness to slight tilting of hand or loosening of grip.  Pt dropped tennis ball 6x on first loop (see activity noted above) d/t decreased awareness to tilted cup in L hand.  Attempted a second trial, same loop, and pt able to balance cup in L hand without cues without any dropping.  Pt verbalized that she increased her attention to L hand on second loop.  Pt continues to be challenged with picking up small items from a non-skid surface when picking up glass stones flat side down using L hand.  Easier to grab flat side up.  Pt is also challenged when trying to scoop multiple small items into L hand, and can successfully gather up to 3-4 at a time (inconsistently) out of a group of 7.  Pt will continue to work towards goals in plan of care to improve left UE functional use in ADL and IADL tasks at home and in the community.    OT Occupational Profile and History Detailed Assessment- Review of Records and additional review of physical, cognitive, psychosocial history related to current functional performance    Occupational performance deficits (Please refer to evaluation for details): ADL's;IADL's;Leisure;Rest and Sleep    Body Structure / Function / Physical Skills ADL;Coordination;Endurance;GMC;UE functional use;Balance;IADL;Pain;Dexterity;FMC;Strength;Edema;Mobility;ROM    Psychosocial Skills  Environmental  Adaptations;Habits;Routines and Behaviors    Rehab Potential Good    Clinical Decision Making Several treatment options, min-mod task modification necessary   Comorbidities Affecting Occupational Performance: May have comorbidities impacting occupational performance    Modification or Assistance to Complete Evaluation  Min-Moderate modification of tasks or assist with assess necessary to complete eval    OT Frequency 2x / week    OT Duration 12 weeks    OT Treatment/Interventions Self-care/ADL training;Cryotherapy;Paraffin;Therapeutic exercise;DME and/or AE instruction;Functional Mobility Training;Balance training;Electrical Stimulation;Ultrasound;Neuromuscular education;Manual Therapy;Splinting;Moist Heat;Contrast Bath;Passive range of motion;Therapeutic activities;Patient/family education;Coping strategies training    Plan OT recert    Consulted and Agree with Plan of Care Patient           Danelle Earthly, MS, OTR/L  [employer] D[employer], OT 08/28/2022, 8:43 AM

## 2022-08-29 ENCOUNTER — Ambulatory Visit: Payer: Medicare HMO

## 2022-08-29 DIAGNOSIS — M6281 Muscle weakness (generalized): Secondary | ICD-10-CM

## 2022-08-29 DIAGNOSIS — R278 Other lack of coordination: Secondary | ICD-10-CM

## 2022-08-29 DIAGNOSIS — R262 Difficulty in walking, not elsewhere classified: Secondary | ICD-10-CM

## 2022-08-29 DIAGNOSIS — R2681 Unsteadiness on feet: Secondary | ICD-10-CM

## 2022-08-29 DIAGNOSIS — I63511 Cerebral infarction due to unspecified occlusion or stenosis of right middle cerebral artery: Secondary | ICD-10-CM

## 2022-08-29 DIAGNOSIS — R2689 Other abnormalities of gait and mobility: Secondary | ICD-10-CM | POA: Diagnosis not present

## 2022-08-29 NOTE — Therapy (Signed)
OUTPATIENT PHYSICAL THERAPY NEURO TREATMENT   Patient Name: Teresa [name] MRN: [id number] DOB:09/10/1940, 82 y.o., female Today's Date: 08/29/2022   PCP: Dr. Aram Beecham REFERRING PROVIDER: Dr. Aram Beecham  END OF SESSION:  PT End of Session - 08/29/22 1150     Visit Number 22    Number of Visits 41    Date for PT Re-Evaluation 11/05/22    Authorization Type [employer] PPO-    Authorization Time Period 05/23/2022- 08/15/2022    Progress Note Due on Visit 10    PT Start Time 1148    PT Stop Time 1229    PT Time Calculation (min) 41 min    Equipment Utilized During Treatment Gait belt    Activity Tolerance Patient tolerated treatment well    Behavior During Therapy WFL for tasks assessed/performed                          Past Medical History:  Diagnosis Date   Cancer (HCC)    skin   Hypothyroidism    Raynaud's disease    Stroke [facility])    Past Surgical History:  Procedure Laterality Date   ABDOMINAL HYSTERECTOMY  1991   APPENDECTOMY  1991   BACK SURGERY  2010   Cervical fusion C4-5-6-7   CATARACT EXTRACTION, BILATERAL Bilateral 10/2016   CHOLECYSTECTOMY  1995   COLONOSCOPY WITH PROPOFOL N/A 12/08/2018   Procedure: COLONOSCOPY WITH PROPOFOL;  Surgeon: Toledo, Boykin Nearing, MD;  Location: [facility] ENDOSCOPY;  Service: Gastroenterology;  Laterality: N/A;   EYE SURGERY     Patient Active Problem List   Diagnosis Date Noted   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   Stroke (HCC) 02/27/2021   History of nonmelanoma skin cancer 05/23/2014    ONSET DATE: 04/16/2022  REFERRING DIAG: 69.354 (ICD-10-CM) - Hemiplegia and hemiparesis following cerebral infarction affecting left non-dominant side   THERAPY DIAG:  Other lack of coordination  Difficulty in walking, not elsewhere classified  Unsteadiness on feet  Muscle weakness (generalized)  Rationale for Evaluation and Treatment: Rehabilitation  SUBJECTIVE:                                                                                                                                                                                              SUBJECTIVE STATEMENT: Pt reports no pain and no stumbles/falls. No other updates reported.    Pt accompanied by: self  PERTINENT HISTORY: Pt is a 82 y.o. female with referral for hemiplegia with left sided weakness -original CVA on 02/26/21 and new onset of left sided weakness on  04/16/2022.  R MCA CVA on 02/26/21. Pt completed outpatient PT on 01/17/2022 with majority of goals met. PMH includes: skin cancer, hypothyroidism, and Raynaud's disease, history of cervical fusion in 2010 C4-C7.   PAIN:  Are you having pain?  no  PRECAUTIONS: Fall  WEIGHT BEARING RESTRICTIONS: No  FALLS: Has patient fallen in last 6 months? No  LIVING ENVIRONMENT: Lives with: lives with their spouse Lives in: House/apartment Stairs: Yes: External: 1 steps; none Has following equipment at home: Single point cane, Walker - 2 wheeled, shower chair, and Grab bars  PLOF: Independent  PATIENT GOALS: I want to walk without this cane and no falls and enjoy going out to grandkids sporting event  OBJECTIVE:   DIAGNOSTIC FINDINGS:   5/24:  EXAM: CT HEAD WITHOUT CONTRAST  IMPRESSION: 1. No evidence of acute intracranial abnormality. 2. Remote right MCA territory infarct.     CLINICAL DATA:  Stroke suspected   EXAM: MRI HEAD WITHOUT CONTRAST   TECHNIQUE: Multiplanar, multiecho pulse sequences of the brain and surrounding structures were obtained without intravenous contrast.   COMPARISON:  MRI Head 02/26/21, CT Head 04/16/22   FINDINGS: Brain: Evolving right MCA territory infarct involving the right frontal operculum in the right insular cortex. No acute infarction, hemorrhage, hydrocephalus, extra-axial collection or mass lesion. Small focus of microhemorrhage in the left temporal lobe. There is petechial hemorrhage and/or  hemosiderosis in the region of prior right MCA territory infarct. T2/stir hyperintense signal in the right cerebral peduncle is favored to represent transneuronal degeneration.   Vascular: Major flow voids are preserved.   Skull and upper cervical spine: Normal marrow signal.   Sinuses/Orbits: Bilateral lens replacement. No mastoid or middle ear effusion. Paranasal sinuses are clear.   Other: None.   IMPRESSION: 1. No acute intracranial abnormality. 2. 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 3.     Electronically Signed   By: Lorenza Cambridge M.D.   On: 04/16/2022 13:41  COGNITION: Overall cognitive status: Within functional limits for tasks assessed   SENSATION: WFL  COORDINATION: Intact with each LE  EDEMA:  None observed  MUSCLE TONE: LLE: Within functional limits  DTRs:  Patella 2+ = Normal and Achilles 2+ = Normal  POSTURE: rounded shoulders and forward head  LOWER EXTREMITY ROM:     Active  Right Eval Left Eval  Hip flexion WNL THROUGHOUT LE WNL THROUGHOUT LE  Hip extension    Hip abduction    Hip adduction    Hip internal rotation    Hip external rotation    Knee flexion    Knee extension    Ankle dorsiflexion    Ankle plantarflexion    Ankle inversion    Ankle eversion     (Blank rows = not tested)  LOWER EXTREMITY MMT:    MMT Right Eval Left Eval  Hip flexion 4+ 4  Hip extension 4+ 4  Hip abduction 4+ 4  Hip adduction 4+ 4  Hip internal rotation 4+ 4  Hip external rotation 4+ 4  Knee flexion 4+ 4  Knee extension 5 4  Ankle dorsiflexion 5 4  Ankle plantarflexion    Ankle inversion    Ankle eversion    (Blank rows = not tested)  BED MOBILITY:  Patient reports independent with all bed mobility and states has returned to master bed  TRANSFERS: Assistive device utilized: None  Sit to stand: Complete Independence Stand to sit: Complete Independence Chair to chair:  Complete Independence Floor:  Not tested   GAIT: Gait pattern: step through pattern, decreased arm swing- Right, decreased arm swing- Left, decreased step length- Right, and decreased step length- Left Distance walked: 915 feet Assistive device utilized: None Level of assistance: SBA Comments: occasional scuff or decreased left foot clearance  FUNCTIONAL TESTS:  5 times sit to stand: 23.62 sec without UE support Timed up and go (TUG): 20.14 sec without an AD 10 meter walk test: 0.70 m/s  PATIENT SURVEYS:  FOTO 60/ goal of 66  TODAY'S TREATMENT:                                                                                                                              DATE: 08/29/22    NMR: -LTL stepping from floor to 6" step to airex pad x multiple reps. Intemrittent foot drag/hitting step. Cuing for increased step-height   Orange hurdle LTL stepping x multiple reps. Able to clear obstacle. Slight decrease in control with LTL step  Pt stepping on airex>over hurdle>airex FWD/BCKWD and LTL x multiple reps for each and intermittent UE support used throughout with CGA  Obstacle course for positional awareness of LUE/LUE: navigating around 6 cones placed near furniture/support surface 6x. Pt then completes with dual cog task 2x, then dual motor task 4x. Shows good awareness of LUE. Only knocks over 1 cone when distracted.  TE: - treadmill endurance training. Pt ambulates up to 1.2 mph x 4 minutes. Exhibits mid-foot strike throughout, pt provides cuing with intermittent carryover. CGA provided.  Standing hip abduction  15x each LE  With 4# AW donned: Standing hip abd 12x each LE  Standing march 2x15 each LE. Pt reports fatigue felt bilat hips  STS  15x    PATIENT EDUCATION: Education details: Pt educated throughout session about proper posture and technique with exercises. Improved exercise technique, movement at target joints, use of target muscles after min to mod verbal,  visual, tactile cues.  Person educated: Patient Education method: Explanation, Demonstration, Tactile cues, and Verbal cues Education comprehension: verbalized understanding, returned demonstration, verbal cues required, tactile cues required, and needs further education  HOME EXERCISE PROGRAM: To be updated next 1-2 visit  GOALS: Goals reviewed with patient? Yes  SHORT TERM GOALS: Target date: 07/04/2022  Patient will be independent in home exercise program to improve strength/mobility for better functional independence with ADLs  Baseline: EVAL: Patient reports mostly just walking; 07/09/22: Pt reports her exercises are going well, to be updated future date Goal status: IN PROGRESS  LONG TERM GOALS: Target date: 11/05/2022    Pt will decrease 5TSTS by at least 6 seconds in order to demonstrate clinically significant improvement in LE strength. Baseline: EVAL= 23.62 sec without UE; 07/09/22:  13 seconds hands-free  Goal status: MET  2. Pt will improve FOTO to target score of 66 to display perceived improvements in ability to complete ADL's.  Baseline: Eval= 60; 07/09/22: 65 5/21: 59% Goal status: PARTIALLY  MET  3.  Pt will decrease TUG to below 14 seconds/decrease in order to demonstrate decreased fall risk. Baseline: Eval= 20.14 sec without AD; 07/09/22: 11 seconds no AD Goal status: MET  4.  Patient will increase six minute walk test distance to >1200 ft for improve gait ability and return to PLOF to complete her walks in her neighborhood.  Baseline: EVAL= 915 feet without an AD; 07/09/22: 1184 ft without AD 5/21: 1060 ft  Goal status: Partially MET  5. Pt will increase by at least 0.13 m/s in order to demonstrate clinically significant improvement in community ambulation.    Baseline: Eval = 0.70 m/s; 07/09/2022: 1.04 m/s without AD   Goal status: MET   6. Pt will report that she has not hit LLE/LUE on doorframe/walls/furniture while ambulating in the past two weeks to  indicate improved ability to scan environment and to decrease injury risk with gait.  Baseline: currently has hit furniture/doorframe 2x/week 5/21: hitting doorframe less frequently. 08/23/2022- Patient reports no bumping arms into any walls or furniture since last tested on 08/13/2022.   Goal status: Ongoing  7. Patient will increase Functional Gait Assessment score to >20/30 as to reduce fall risk and improve dynamic gait safety with community ambulation.  Baseline:5/21: 13/30  Goal status: NEW   ASSESSMENT:  CLINICAL IMPRESSION: Continued focus on dynamic balance activities including obstacle course to assess and practice positional awareness of LUE/LLE. Pt exhibits improvement in this skill compared to past sessions with author. The pt will benefit from skilled PT services to improve her overall left LE muscle strength and improve her mobility to decrease her fall risk and improve her overall quality of life.   OBJECTIVE IMPAIRMENTS: Abnormal gait, decreased activity tolerance, decreased balance, decreased coordination, decreased endurance, decreased mobility, difficulty walking, and decreased strength.   ACTIVITY LIMITATIONS: carrying, lifting, bending, standing, squatting, stairs, and transfers  PARTICIPATION LIMITATIONS: cleaning, laundry, driving, shopping, community activity, and yard work  PERSONAL FACTORS: 3+ comorbidities: OA, HTN, hx of previous CVA  are also affecting patient's functional outcome.   REHAB POTENTIAL: Good  CLINICAL DECISION MAKING: Evolving/moderate complexity  EVALUATION COMPLEXITY: Moderate  PLAN:  PT FREQUENCY: 1-2x/week  PT DURATION: 12 weeks  PLANNED INTERVENTIONS: Therapeutic exercises, Therapeutic activity, Neuromuscular re-education, Balance training, Gait training, Patient/Family education, Self Care, Joint mobilization, Joint manipulation, Stair training, Vestibular training, Canalith repositioning, DME instructions, Dry Needling, Electrical  stimulation, Spinal manipulation, Spinal mobilization, Cryotherapy, Moist heat, Taping, and Manual therapy  PLAN FOR NEXT SESSION:   Progress gait training to include dual-cog tasks, BLE endurance & dynamic balance.  Monitor BP and continue to assess for worsening s/s of stroke. Continue plan  Temple Pacini PT, DPT    [facility]  08/29/2022, 5:48 PM   Physical Therapist - [facility]  Outpatient Physical Therapy- Main Campus [phone]

## 2022-08-30 NOTE — Therapy (Signed)
OUTPATIENT OCCUPATIONAL THERAPY NEURO TREATMENT NOTE        Patient Name: Teresa [name] MRN: [id number] DOB:[birth year], 82 y.o., female Today's Date: 05/23/2022  PCP: Dr. Aram Beecham REFERRING PROVIDER: Dr. Aram Beecham   OT End of Session - 08/29/22 1104     Visit Number [id number]    Number of Visits 138    Date for OT Re-Evaluation 10/10/22    Authorization Time Period Reporting period beginning 08/20/22    Progress Note Due on Visit 10    OT Start Time 1100    OT Stop Time 1145    OT Time Calculation (min) 45 min    Equipment Utilized During Treatment cane    Activity Tolerance Patient tolerated treatment well    Behavior During Therapy WFL for tasks assessed/performed            Past Medical History:  Diagnosis Date   Cancer (HCC)    skin   Hypothyroidism    Raynaud's disease    Stroke [facility])    Past Surgical History:  Procedure Laterality Date   ABDOMINAL HYSTERECTOMY  1991   APPENDECTOMY  1991   BACK SURGERY  2010   Cervical fusion C4-5-6-7   CATARACT EXTRACTION, BILATERAL Bilateral 10/2016   CHOLECYSTECTOMY  1995   COLONOSCOPY WITH PROPOFOL N/A 12/08/2018   Procedure: COLONOSCOPY WITH PROPOFOL;  Surgeon: Toledo, Boykin Nearing, MD;  Location: [facility] ENDOSCOPY;  Service: Gastroenterology;  Laterality: N/A;   EYE SURGERY     Patient Active Problem List   Diagnosis Date Noted   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   Stroke (HCC) 02/27/2021   History of nonmelanoma skin cancer 05/23/2014   ONSET DATE: 02/26/2021  REFERRING DIAG: R MCA CVA  THERAPY DIAG:  Muscle weakness (generalized)  Other lack of coordination  Right middle cerebral artery stroke [facility])  Rationale for Evaluation and Treatment Rehabilitation  PERTINENT HISTORY: February 26, 2021, pt reports she had a CVA, came to [facility] to ER and then was transferred to [facility] in [place] where she was admitted and after acute care she went to inpatient rehab.  Following inpt  rehab, pt went home and had home health.   PRECAUTIONS: fall  SUBJECTIVE: POC discussed with pt in regards to progress and certification period ending mid July with likely plans to d/c at that time with ongoing recommendations for HEP.  Pt reported that she thinks she'll feel ready at that time, and would like to focus her remaining time on L hand FMC/dexterity skills.  PAIN:  Are you having pain? none  OBJECTIVE:  L grip 6, R grip 41 L lateral pinch 5, R 13 L 3 point pinch 4, R 14  L 9 hole 1 min 43 sec. L shoulder active flexion 0-105, passive 0-125 02/05/22: L grip 13#  L lateral pinch 8#  3 point pinch 5#  L shoulder flex 0-108 03/12/22:  L grip: 12# L lateral pinch: 8# 3 point pinch: 6# L 9 hole: 1 min 3 sec L shoulder flexion 0-110 04/16/22: L grip: 3# L lateral pinch: 4# 3 point pinch: difficulty maintaining 3 point pinch  L 9 hole: 5 pegs in 2 min 36 sec  L shoulder flexion 0-107 BP L 132/79 HR 80 02 sats 96% 04/25/22: L grip: 14#; R grip: 50# L lateral 2#, R lateral 17# 3 point pinch: L 4#, R 14# (Saehan pinch gauge) 9 hole peg test: 1 min, 48 sec L shoulder flex 125 (R shoulder 132  active) 05/21/22: L grip: 15#; R grip: 50# L lateral 8#, R lateral 20# 3 point pinch: L 4#, R 12# (Saehan pinch gauge) 9 hole peg test: 1 min, 23 sec 07/04/22: L grip: 19# L lateral 10# 3 point pinch: L 6# (standard pinch gauge-not Saehan) 9 hole peg test: 1 min, 14 sec 07/18/22:  L grip 22# L lateral: 9# L 3 point pinch: 6# L 9 hole: 1 min 8 sec 08/20/22: (Feeling sluggish since 08/16/22 after ED visit for medication adverse reaction) L grip: 21# L lateral: 7# L 3 point pinch: 7# (standard pinch gauge) L 9 hole: 4 pegs in 3 min (OT stopped at 3 min).  08/23/22:  L 9 hole: 1 min 12 sec  TODAY'S TREATMENT: Neuro re-ed: Facilitated L hand FMC/dexterity skills working to pick 1.5 cm alphabet dice in L hand from table top.  Pt practiced manipulation skills with 1 dice at a  time, moving dice from palm to fingertips and repositioning it to find a designated letter without dropping it.    Participation in L hand FMC/dexterity skills working to pick up [place] stones from table top with and without a non-skid surface, stone flat side up and flat side down.  Pt practiced scooping multiple stones into hand, picking up stones 1 at a time and storing in hand, and discarding stones 1 at a time from palm.  Practiced placing grooved pegs into pegboard using L hand.  Pt intermittently used R hand to push the peg further up in her fingertips; cues to limit use of R hand.  PATIENT EDUCATION: Education details: progress towards goals, anticipate d/c end of cert Person educated: pt Education method: explanation Education comprehension: verbalized understanding, in agreement  HOME EXERCISE PROGRAM Continue to engage LUE into ADLs; continue use of putty for gripping and pinching exercises for LUE, and L shoulder AROM/AAROM, crocheting, typing; increase participation in IADL tasks for greater use of L hand.    OT Short Term Goals - 08/29/22 (6 weeks)      OT SHORT TERM GOAL #1   Title Pt will be independent with home exercise program.    Baseline Eval: no current program, 10th visit:  continue to add new exercises as pt progresses, 20th:  continue to update HEP; 08/17/21: continue to progress HEP when indicated.  Visit 40: adding new exercises as pt progresses; 12/25/21: ongoing with progressions; 03/12/22: inconsistent use of putty but regular use of stress ball (every other day).  Encouraged pt return to daily putty use (2x daily when able) to target grip and pinch strengthening; 04/16/22: not reviewed today d/t OT sent pt to ED; 04/25/22: added pulleys and pt has re-started putty; 05/21/22: stopped pulleys and added stronger theraputty and encouraged pt crochet often; 07/04/22: pt working consistently with putty and grip strengthener at 20# at home; 07/18/22: pt continues to work on putty  and Building surveyor and will be starting with her crocheting again to progress [facility] skills; 08/20/22: Pt continues to use her grip strengthener daily set at 20#; will continue to modify as needed   Time 12    Period Weeks    Status On-going    Target Date 08/29/22     OT LONG TERM GOAL #2   Title Pt will complete UB and LB dressing with modified independence including buttons, snaps and zippers.    Baseline requires min assist at eval, 10th visit: occasional assist with buttons, 20th:  able to perform one handed, but difficulty with bilateral UE; 08/17/21:  pt reports inconsistent with 1 hand, reviewed techniques this visit, Visit 40:  Pt requires assist with bra   Time 12    Period Weeks    Status MET   Target Date 11/08/21     OT LONG TERM GOAL #3   Title Pt will perform shower transfer with modified independence.    Baseline Pt requires supervision to min assist for shower transfer at home. 10th visit: supervision; 08/17/21: supv .  Visit 40:  Pt had met goal but had recent fall and now requiring min guard to supv   Time 6    Period Weeks    Status  Met    Target Date 11/08/21      OT LONG TERM GOAL #4 10/10/22 (12 weeks)    Title Pt will improve L hand grip to 25 or more #s to enable pt to open a new jar.   Baseline no grip in left hand at eval, 10th visit:  improved flexion but still working towards composite fisting and grip. 20th:  continues to demo decreased grip; 08/17/21: active digit flexion improving, but not yet able to register grip on dynamometer; 09/04/21: L grip 1# 7/17:  5#; 12/25/21: L grip 6#; 02/05/22: 13#; 03/12/22: L grip 12#; 1/23//24: L grip 3# (decreased); 04/25/22: L grip 14#; spouse assists to open new jars; 05/21/22: L grip 15#; 07/04/22: L grip 19#; 07/18/22: L grip 22#, difficulty opening a new jar (R grip 50#); 08/20/22: L grip 21# (increased weakness since Friday after adverse reaction to a new medication resulting in ED visit).   Time 12    Period Weeks    Status  Revised/ongoing   Target Date 10/10/22     OT LONG TERM GOAL #5   Title Pt will improve left shoulder flexion to 100 degrees or better to improve reaching to obtain self care items from shelf/shoulder height.    Baseline difficulty with reach, shoulder flexion to 47 degrees; 08/17/21: L shoulder flexion 85, but not yet able to consistently hold ADL supplies in L hand when reaching; 09/04/21: flexion 85 7/17:  shoulder flexion to 90; 12/25/21: 105 (P 125); 03/12/22: active L shoulder flexion 110 (pt uses R arm to reach for items at shoulder height or above); 04/16/22: L shoulder flexion 107; 04/25/22: L shoulder flexion 125*, R 132*    Time 12    Period Weeks    Status achieved   Target Date 04/25/22     OT LONG TERM GOAL #6   Title Pt will improve FOTO score to 47 or above to demonstrate a clinically relevant change in function to impact ADL tasks.    Baseline score of 30 at eval; 08/17/21: FOTO: 42; 09/04/21: FOTO : 54 , FOTO 7/7:  60; 12/25/21: 59; 03/12/22: 66; 04/16/22: 67; 04/25/22; 51; 05/21/22: 74; 07/04/22: 74; 07/18/22: 71; 08/20/22: 77   Time 12    Period Weeks    Status  MET/ongoing    Target Date 10/10/22     OT LONG TERM GOAL #7   Title Pt will demonstrate ability to pick up small objects with L hand and complete 9 hole peg test in less than 1 min (revised)   Baseline unable to perform at eval, 10th visit: still unable to pick up small pegs, 20th: unable to pick up pegs but improving; 08/17/21: not attempted d/t time constraints, will assess next visit; 09/04/21: 9 hole peg test in 5 min 53 sec;  7/17:  Pt unable to complete this date but  was able to place 6 of 9 pegs in 4 mins 20 secs; 12/25/21: L 1 min 43 sec; 03/12/22: L 1 min 2 sec (requires non-skid surface to pick up small items from table top); 04/16/22: 5 pegs in 2 min 36 secs; 04/25/22: 1 min 48 sec; 05/21/22: L 1 min 23 sec; 07/04/22: 1 min 14 sec; 08/20/22: Able to complete 4 pegs in 3 min before OT stopped test (declined LUE motor skills since ED  visit Friday 08/16/22 d/t adverse reaction to a new med)   Time 12    Period Weeks    Status Revised/On-going    Target Date 10/10/22    OT LONG TERM GOAL #8   Title Pt will increase LUE strength by 1 MM grade in order to hold blow dryer in LUE to dry hair without dropping dryer.   Baseline 04/25/22: Unable to sustain grip and maintain lift of LUE with hand near head without dropping dryer.  Dryer has caused bruising above L eye with previous attempts.  L shoulder flex/abd 3/5, elbow flex/ext 3+/5; 07/04/22: L shoulder flex/abd 3+, elbow and wrist flex/ext 4+   Time 12    Period Weeks    Status New/ ongoing   Target Date 10/10/22        OT LONG TERM GOAL #9   Title Pt will increase L hand dexterity to enable pt to independently manage clothing fasteners.   Baseline 04/25/22: pt ties shoe laces loosely and requires assist from spouse occasionally.  When typing, pt engages the L hand for ~25% of the task.  Pt avoids wearing jeans d/t inability to manage button or zipper; 07/04/22: no change yet from 04/25/22; 07/18/22: Managed zipper today, but increased time and effort to manage buttons on pants; able to tie shoes loosely with increased time; 08/20/23: better efficiency with managing zipper on a jacket; pt manages buttons 1 handed (R); able to tie shoes without laces being loose   Time 12    Period Weeks    Status Ongoing   Target Date 10/10/22        OT LONG TERM GOAL #10   Title Pt will be able to independently carry a light plate of food or drink in L hand without spilling/dropping to increase efficiency with item transport in the kitchen.   Baseline 04/25/22: Pt requires constant cues to keep cup or plate level while multitasking in order to prevent spilling; 07/04/22: Pt reports she is carrying her coffee cup from kitchen to neighboring room (not yet consistent to avoid spills); 08/20/22: Pt has not been using L hand to carry items much since Friday d/t increased weakness since ED visit.   Time 12     Period Weeks    Status New/ongoing   Target Date 10/10/22     Plan -     Clinical Impression Statement POC discussed with pt in regards to progress and certification period ending mid July with likely plans to d/c at that time with ongoing recommendations for HEP.  Pt reported that she thinks she'll feel ready at that time, and would like to focus her remaining time on L hand FMC/dexterity skills.  Pt reports that she has started back with her crocheting and has been working about an hour a night making a blanket for her first great grandchild that is due next January.  Pt reports her crocheting is slow, but she's been consistent to sit down and work with it.  Focus today on L hand FMC/dexterity skills.  Pt was early for her appointment so she worked on her hand gripper in the lobby while she waited for her appt.  Pt will continue to work towards goals in plan of care to improve left UE functional use in ADL and IADL tasks at home and in the community.    OT Occupational Profile and History Detailed Assessment- Review of Records and additional review of physical, cognitive, psychosocial history related to current functional performance    Occupational performance deficits (Please refer to evaluation for details): ADL's;IADL's;Leisure;Rest and Sleep    Body Structure / Function / Physical Skills ADL;Coordination;Endurance;GMC;UE functional use;Balance;IADL;Pain;Dexterity;FMC;Strength;Edema;Mobility;ROM    Psychosocial Skills Environmental  Adaptations;Habits;Routines and Behaviors    Rehab Potential Good    Clinical Decision Making Several treatment options, min-mod task modification necessary   Comorbidities Affecting Occupational Performance: May have comorbidities impacting occupational performance    Modification or Assistance to Complete Evaluation  Min-Moderate modification of tasks or assist with assess necessary to complete eval    OT Frequency 2x / week    OT Duration 12 weeks    OT  Treatment/Interventions Self-care/ADL training;Cryotherapy;Paraffin;Therapeutic exercise;DME and/or AE instruction;Functional Mobility Training;Balance training;Electrical Stimulation;Ultrasound;Neuromuscular education;Manual Therapy;Splinting;Moist Heat;Contrast Bath;Passive range of motion;Therapeutic activities;Patient/family education;Coping strategies training    Plan OT recert    Consulted and Agree with Plan of Care Patient           Danelle Earthly, MS, OTR/L  [employer] Dials, OT 08/30/2022, 8:26 AM

## 2022-09-02 NOTE — Progress Notes (Signed)
Carelink Summary Report / Loop Recorder 

## 2022-09-03 ENCOUNTER — Ambulatory Visit: Payer: Medicare HMO

## 2022-09-03 DIAGNOSIS — M6281 Muscle weakness (generalized): Secondary | ICD-10-CM

## 2022-09-03 DIAGNOSIS — R2689 Other abnormalities of gait and mobility: Secondary | ICD-10-CM

## 2022-09-03 DIAGNOSIS — R262 Difficulty in walking, not elsewhere classified: Secondary | ICD-10-CM | POA: Diagnosis not present

## 2022-09-03 DIAGNOSIS — R278 Other lack of coordination: Secondary | ICD-10-CM

## 2022-09-03 DIAGNOSIS — I63511 Cerebral infarction due to unspecified occlusion or stenosis of right middle cerebral artery: Secondary | ICD-10-CM | POA: Diagnosis not present

## 2022-09-03 DIAGNOSIS — R2681 Unsteadiness on feet: Secondary | ICD-10-CM

## 2022-09-03 NOTE — Therapy (Signed)
OUTPATIENT PHYSICAL THERAPY NEURO TREATMENT   Patient Name: Teresa [name] MRN: [id number] DOB:1940/05/02, 82 y.o., female Today's Date: 09/03/2022   PCP: Dr. Aram Beecham REFERRING PROVIDER: Dr. Aram Beecham  END OF SESSION:  PT End of Session - 09/03/22 1105     Visit Number 23    Number of Visits 41    Date for PT Re-Evaluation 11/05/22    Authorization Type [employer] PPO-    Authorization Time Period 05/23/2022- 08/15/2022    Progress Note Due on Visit 10    PT Start Time 1105    PT Stop Time 1147    PT Time Calculation (min) 42 min    Equipment Utilized During Treatment Gait belt    Activity Tolerance Patient tolerated treatment well    Behavior During Therapy WFL for tasks assessed/performed                          Past Medical History:  Diagnosis Date   Cancer (HCC)    skin   Hypothyroidism    Raynaud's disease    Stroke [facility])    Past Surgical History:  Procedure Laterality Date   ABDOMINAL HYSTERECTOMY  1991   APPENDECTOMY  1991   BACK SURGERY  2010   Cervical fusion C4-5-6-7   CATARACT EXTRACTION, BILATERAL Bilateral 10/2016   CHOLECYSTECTOMY  1995   COLONOSCOPY WITH PROPOFOL N/A 12/08/2018   Procedure: COLONOSCOPY WITH PROPOFOL;  Surgeon: Toledo, Boykin Nearing, MD;  Location: [facility] ENDOSCOPY;  Service: Gastroenterology;  Laterality: N/A;   EYE SURGERY     Patient Active Problem List   Diagnosis Date Noted   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   Stroke (HCC) 02/27/2021   History of nonmelanoma skin cancer 05/23/2014    ONSET DATE: 04/16/2022  REFERRING DIAG: 69.354 (ICD-10-CM) - Hemiplegia and hemiparesis following cerebral infarction affecting left non-dominant side   THERAPY DIAG:  Muscle weakness (generalized)  Other lack of coordination  Right middle cerebral artery stroke (HCC)  Difficulty in walking, not elsewhere classified  Unsteadiness on feet  Other abnormalities of gait and  mobility  Rationale for Evaluation and Treatment: Rehabilitation  SUBJECTIVE:                                                                                                                                                                                             SUBJECTIVE STATEMENT: Pt reports no new issues and states she walk about 4K steps yesterday with her husband cueing her to "Take bigger steps."    Pt accompanied by: self  PERTINENT HISTORY: Pt  is a 82 y.o. female with referral for hemiplegia with left sided weakness -original CVA on 02/26/21 and new onset of left sided weakness on 04/16/2022.  R MCA CVA on 02/26/21. Pt completed outpatient PT on 01/17/2022 with majority of goals met. PMH includes: skin cancer, hypothyroidism, and Raynaud's disease, history of cervical fusion in 2010 C4-C7.   PAIN:  Are you having pain?  no  PRECAUTIONS: Fall  WEIGHT BEARING RESTRICTIONS: No  FALLS: Has patient fallen in last 6 months? No  LIVING ENVIRONMENT: Lives with: lives with their spouse Lives in: House/apartment Stairs: Yes: External: 1 steps; none Has following equipment at home: Single point cane, Walker - 2 wheeled, shower chair, and Grab bars  PLOF: Independent  PATIENT GOALS: I want to walk without this cane and no falls and enjoy going out to grandkids sporting event  OBJECTIVE:   DIAGNOSTIC FINDINGS:   5/24:  EXAM: CT HEAD WITHOUT CONTRAST  IMPRESSION: 1. No evidence of acute intracranial abnormality. 2. Remote right MCA territory infarct.     CLINICAL DATA:  Stroke suspected   EXAM: MRI HEAD WITHOUT CONTRAST   TECHNIQUE: Multiplanar, multiecho pulse sequences of the brain and surrounding structures were obtained without intravenous contrast.   COMPARISON:  MRI Head 02/26/21, CT Head 04/16/22   FINDINGS: Brain: Evolving right MCA territory infarct involving the right frontal operculum in the right insular cortex. No acute infarction, hemorrhage,  hydrocephalus, extra-axial collection or mass lesion. Small focus of microhemorrhage in the left temporal lobe. There is petechial hemorrhage and/or hemosiderosis in the region of prior right MCA territory infarct. T2/stir hyperintense signal in the right cerebral peduncle is favored to represent transneuronal degeneration.   Vascular: Major flow voids are preserved.   Skull and upper cervical spine: Normal marrow signal.   Sinuses/Orbits: Bilateral lens replacement. No mastoid or middle ear effusion. Paranasal sinuses are clear.   Other: None.   IMPRESSION: 1. No acute intracranial abnormality. 2. 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 3.     Electronically Signed   By: Lorenza Cambridge M.D.   On: 04/16/2022 13:41  COGNITION: Overall cognitive status: Within functional limits for tasks assessed   SENSATION: WFL  COORDINATION: Intact with each LE  EDEMA:  None observed  MUSCLE TONE: LLE: Within functional limits  DTRs:  Patella 2+ = Normal and Achilles 2+ = Normal  POSTURE: rounded shoulders and forward head  LOWER EXTREMITY ROM:     Active  Right Eval Left Eval  Hip flexion WNL THROUGHOUT LE WNL THROUGHOUT LE  Hip extension    Hip abduction    Hip adduction    Hip internal rotation    Hip external rotation    Knee flexion    Knee extension    Ankle dorsiflexion    Ankle plantarflexion    Ankle inversion    Ankle eversion     (Blank rows = not tested)  LOWER EXTREMITY MMT:    MMT Right Eval Left Eval  Hip flexion 4+ 4  Hip extension 4+ 4  Hip abduction 4+ 4  Hip adduction 4+ 4  Hip internal rotation 4+ 4  Hip external rotation 4+ 4  Knee flexion 4+ 4  Knee extension 5 4  Ankle dorsiflexion 5 4  Ankle plantarflexion    Ankle inversion    Ankle eversion    (Blank rows = not tested)  BED MOBILITY:  Patient reports independent with all bed mobility and states has  returned to  master bed  TRANSFERS: Assistive device utilized: None  Sit to stand: Complete Independence Stand to sit: Complete Independence Chair to chair: Complete Independence Floor:  Not tested   GAIT: Gait pattern: step through pattern, decreased arm swing- Right, decreased arm swing- Left, decreased step length- Right, and decreased step length- Left Distance walked: 915 feet Assistive device utilized: None Level of assistance: SBA Comments: occasional scuff or decreased left foot clearance  FUNCTIONAL TESTS:  5 times sit to stand: 23.62 sec without UE support Timed up and go (TUG): 20.14 sec without an AD 10 meter walk test: 0.70 m/s  PATIENT SURVEYS:  FOTO 60/ goal of 66  TODAY'S TREATMENT:                                                                                                                              DATE: 09/03/22    NMR:  Forward step over pvc pipe x 2 at support bar with 4# AW-  x 20 back and forth  Static stand on airex pad at support bar with dry erase board- NBOS while lateral weight shifting to move colored letter into top left and then right corner to challenge ability to reach- diagonal overhead while standing on unstable surface.   Heel to toe gait sequencing activity using 3# AW x 20 reps each LE- ensuring to make heel strike ground and toe box touch airex pad.     TE:  Forward lunge onto BOSU ball x 20 reps each LE with 3# AW  Sit to stand while holding onto 5kg ball x 15 reps   Combination LE strengthening- Hip march+hip ER 3# AW x 15 reps each LE Combination LE strengthening - knee flex +hip ext (donkey kick) 3# AW - 12 reps each LE   -Overground endurance training. Pt ambulated approx 500 feet. Increased VC with distance for reminders to pick up left foot with 1 instance of significant drag and physical assist to maintain upright balance.      PATIENT EDUCATION: Education details: Pt educated throughout session about proper posture and  technique with exercises. Improved exercise technique, movement at target joints, use of target muscles after min to mod verbal, visual, tactile cues.  Person educated: Patient Education method: Explanation, Demonstration, Tactile cues, and Verbal cues Education comprehension: verbalized understanding, returned demonstration, verbal cues required, tactile cues required, and needs further education  HOME EXERCISE PROGRAM: To be updated next 1-2 visit  GOALS: Goals reviewed with patient? Yes  SHORT TERM GOALS: Target date: 07/04/2022  Patient will be independent in home exercise program to improve strength/mobility for better functional independence with ADLs  Baseline: EVAL: Patient reports mostly just walking; 07/09/22: Pt reports her exercises are going well, to be updated future date Goal status: IN PROGRESS  LONG TERM GOALS: Target date: 11/05/2022    Pt will decrease 5TSTS by at least 6 seconds in order to demonstrate clinically significant improvement in LE strength.  Baseline: EVAL= 23.62 sec without UE; 07/09/22:  13 seconds hands-free  Goal status: MET  2. Pt will improve FOTO to target score of 66 to display perceived improvements in ability to complete ADL's.  Baseline: Eval= 60; 07/09/22: 65 5/21: 59% Goal status: PARTIALLY MET  3.  Pt will decrease TUG to below 14 seconds/decrease in order to demonstrate decreased fall risk. Baseline: Eval= 20.14 sec without AD; 07/09/22: 11 seconds no AD Goal status: MET  4.  Patient will increase six minute walk test distance to >1200 ft for improve gait ability and return to PLOF to complete her walks in her neighborhood.  Baseline: EVAL= 915 feet without an AD; 07/09/22: 1184 ft without AD 5/21: 1060 ft  Goal status: Partially MET  5. Pt will increase by at least 0.13 m/s in order to demonstrate clinically significant improvement in community ambulation.    Baseline: Eval = 0.70 m/s; 07/09/2022: 1.04 m/s without AD   Goal status:  MET   6. Pt will report that she has not hit LLE/LUE on doorframe/walls/furniture while ambulating in the past two weeks to indicate improved ability to scan environment and to decrease injury risk with gait.  Baseline: currently has hit furniture/doorframe 2x/week 5/21: hitting doorframe less frequently. 08/23/2022- Patient reports no bumping arms into any walls or furniture since last tested on 08/13/2022.   Goal status: Ongoing  7. Patient will increase Functional Gait Assessment score to >20/30 as to reduce fall risk and improve dynamic gait safety with community ambulation.  Baseline:5/21: 13/30  Goal status: NEW   ASSESSMENT:  CLINICAL IMPRESSION: Patient presented with good motivation for today's session. Treatment continued with balance training and overall LE strengthening to focus on picking up her left LE with gait. She performed well with resistive gait and heel/toe activity well with only 1LOB. Patient able to demo overall improved gait vs. Some previous sessions. The pt will benefit from skilled PT services to improve her overall left LE muscle strength and improve her mobility to decrease her fall risk and improve her overall quality of life.   OBJECTIVE IMPAIRMENTS: Abnormal gait, decreased activity tolerance, decreased balance, decreased coordination, decreased endurance, decreased mobility, difficulty walking, and decreased strength.   ACTIVITY LIMITATIONS: carrying, lifting, bending, standing, squatting, stairs, and transfers  PARTICIPATION LIMITATIONS: cleaning, laundry, driving, shopping, community activity, and yard work  PERSONAL FACTORS: 3+ comorbidities: OA, HTN, hx of previous CVA  are also affecting patient's functional outcome.   REHAB POTENTIAL: Good  CLINICAL DECISION MAKING: Evolving/moderate complexity  EVALUATION COMPLEXITY: Moderate  PLAN:  PT FREQUENCY: 1-2x/week  PT DURATION: 12 weeks  PLANNED INTERVENTIONS: Therapeutic exercises, Therapeutic  activity, Neuromuscular re-education, Balance training, Gait training, Patient/Family education, Self Care, Joint mobilization, Joint manipulation, Stair training, Vestibular training, Canalith repositioning, DME instructions, Dry Needling, Electrical stimulation, Spinal manipulation, Spinal mobilization, Cryotherapy, Moist heat, Taping, and Manual therapy  PLAN FOR NEXT SESSION:   Progress gait training to include dual-cog tasks, BLE endurance & dynamic balance.  Monitor BP and continue to assess for worsening s/s of stroke. Continue plan  Louis Meckel, PT   [facility]  09/03/2022, 12:33 PM   Physical Therapist - [facility]  Outpatient Physical Therapy- Main Campus [phone]

## 2022-09-03 NOTE — Therapy (Signed)
OUTPATIENT OCCUPATIONAL THERAPY NEURO TREATMENT NOTE        Patient Name: Teresa [name] MRN: [id number] DOB:[birth year], 82 y.o., female Today's Date: 05/23/2022  PCP: Dr. Aram Beecham REFERRING PROVIDER: Dr. Aram Beecham   OT End of Session - 09/03/22 1026     Visit Number [id number]    Number of Visits 138    Date for OT Re-Evaluation 10/10/22    Authorization Time Period Reporting period beginning 08/20/22    Progress Note Due on Visit 10    OT Start Time 1015    OT Stop Time 1100    OT Time Calculation (min) 45 min    Equipment Utilized During Treatment cane    Activity Tolerance Patient tolerated treatment well    Behavior During Therapy WFL for tasks assessed/performed            Past Medical History:  Diagnosis Date   Cancer (HCC)    skin   Hypothyroidism    Raynaud's disease    Stroke [facility])    Past Surgical History:  Procedure Laterality Date   ABDOMINAL HYSTERECTOMY  1991   APPENDECTOMY  1991   BACK SURGERY  2010   Cervical fusion C4-5-6-7   CATARACT EXTRACTION, BILATERAL Bilateral 10/2016   CHOLECYSTECTOMY  1995   COLONOSCOPY WITH PROPOFOL N/A 12/08/2018   Procedure: COLONOSCOPY WITH PROPOFOL;  Surgeon: Toledo, Boykin Nearing, MD;  Location: [facility] ENDOSCOPY;  Service: Gastroenterology;  Laterality: N/A;   EYE SURGERY     Patient Active Problem List   Diagnosis Date Noted   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  Following inpt  rehab, pt went home and had home health.   PRECAUTIONS: fall  SUBJECTIVE: Pt reports she dropped a cup from her L hand this morning when trying to hold a cup in her L hand and a cup in her R hand at the same time.    PAIN:  Are you having pain? none  OBJECTIVE:  L grip 6, R grip 41 L lateral pinch 5, R 13 L 3 point pinch 4, R 14  L 9 hole 1 min 43 sec. L shoulder active flexion 0-105, passive 0-125 02/05/22: L grip 13#  L lateral pinch 8#  3 point pinch 5#  L shoulder flex 0-108 03/12/22:  L grip: 12# L lateral pinch: 8# 3 point pinch: 6# L 9 hole: 1 min 3 sec L shoulder flexion 0-110 04/16/22: L grip: 3# L lateral pinch: 4# 3 point pinch: difficulty maintaining 3 point pinch  L 9 hole: 5 pegs in 2 min 36 sec  L shoulder flexion 0-107 BP L 132/79 HR 80 02 sats 96% 04/25/22: L grip: 14#; R grip: 50# L lateral 2#, R lateral 17# 3 point pinch: L 4#, R 14# (Saehan pinch gauge) 9 hole peg test: 1 min, 48 sec L shoulder flex 125 (R shoulder 132 active) 05/21/22: L grip: 15#; R grip: 50# L lateral 8#, R lateral 20# 3 point pinch:  L 4#, R 12# (Saehan pinch gauge) 9 hole peg test: 1 min, 23 sec 07/04/22: L grip: 19# L lateral 10# 3 point pinch: L 6# (standard pinch gauge-not Saehan) 9 hole peg test: 1 min, 14 sec 07/18/22:  L grip 22# L lateral: 9# L 3 point pinch: 6# L 9 hole: 1 min 8 sec 08/20/22: (Feeling sluggish since 08/16/22 after ED visit for medication adverse reaction) L grip: 21# L lateral: 7# L 3 point pinch: 7# (standard pinch gauge) L 9 hole: 4 pegs in 3 min (OT stopped at 3 min).  08/23/22:  L 9 hole: 1 min 12 sec  TODAY'S TREATMENT: Neuro re-ed: Participation in L hand FMC/dexterity skills working to pick up small pegs, store in hand, scoop from table top, and discard into container.  Pt practiced initially with removing 1 peg at a time for a warm up, then placing 1 peg at a time, progressing to removing a row of pegs, storing in hand, and scooping up  to 3-4 from table top from a group of 5 into palm.    PATIENT EDUCATION: Education details: FMC tasks for home program Person educated: pt Education method: explanation Education comprehension: verbalized understanding  HOME EXERCISE PROGRAM Continue to engage LUE into ADLs; continue use of putty for gripping and pinching exercises for LUE, and L shoulder AROM/AAROM, crocheting, typing; increase participation in IADL tasks for greater use of L hand.    OT Short Term Goals - 08/29/22 (6 weeks)      OT SHORT TERM GOAL #1   Title Pt will be independent with home exercise program.    Baseline Eval: no current program, 10th visit:  continue to add new exercises as pt progresses, 20th:  continue to update HEP; 08/17/21: continue to progress HEP when indicated.  Visit 40: adding new exercises as pt progresses; 12/25/21: ongoing with progressions; 03/12/22: inconsistent use of putty but regular use of stress ball (every other day).  Encouraged pt return to daily putty use (2x daily when able) to target grip and pinch strengthening; 04/16/22: not reviewed today d/t OT sent pt to ED; 04/25/22: added pulleys and pt has re-started putty; 05/21/22: stopped pulleys and added stronger theraputty and encouraged pt crochet often; 07/04/22: pt working consistently with putty and grip strengthener at 20# at home; 07/18/22: pt continues to work on putty and Building surveyor and will be starting with her crocheting again to progress [facility] skills; 08/20/22: Pt continues to use her grip strengthener daily set at 20#; will continue to modify as needed   Time 12    Period Weeks    Status On-going    Target Date 08/29/22     OT LONG TERM GOAL #2   Title Pt will complete UB and LB dressing with modified independence including buttons, snaps and zippers.    Baseline requires min assist at eval, 10th visit: occasional assist with buttons, 20th:  able to perform one handed, but difficulty with bilateral UE; 08/17/21: pt reports  inconsistent with 1 hand, reviewed techniques this visit, Visit 40:  Pt requires assist with bra   Time 12    Period Weeks    Status MET   Target Date 11/08/21     OT LONG TERM GOAL #3   Title Pt will perform shower transfer with modified independence.    Baseline Pt requires supervision to min assist for shower transfer at home. 10th visit: supervision; 08/17/21: supv .  Visit 40:  Pt had met goal but had  recent fall and now requiring min guard to supv   Time 6    Period Weeks    Status  Met    Target Date 11/08/21      OT LONG TERM GOAL #4 10/10/22 (12 weeks)    Title Pt will improve L hand grip to 25 or more #s to enable pt to open a new jar.   Baseline no grip in left hand at eval, 10th visit:  improved flexion but still working towards composite fisting and grip. 20th:  continues to demo decreased grip; 08/17/21: active digit flexion improving, but not yet able to register grip on dynamometer; 09/04/21: L grip 1# 7/17:  5#; 12/25/21: L grip 6#; 02/05/22: 13#; 03/12/22: L grip 12#; 1/23//24: L grip 3# (decreased); 04/25/22: L grip 14#; spouse assists to open new jars; 05/21/22: L grip 15#; 07/04/22: L grip 19#; 07/18/22: L grip 22#, difficulty opening a new jar (R grip 50#); 08/20/22: L grip 21# (increased weakness since Friday after adverse reaction to a new medication resulting in ED visit).   Time 12    Period Weeks    Status Revised/ongoing   Target Date 10/10/22     OT LONG TERM GOAL #5   Title Pt will improve left shoulder flexion to 100 degrees or better to improve reaching to obtain self care items from shelf/shoulder height.    Baseline difficulty with reach, shoulder flexion to 47 degrees; 08/17/21: L shoulder flexion 85, but not yet able to consistently hold ADL supplies in L hand when reaching; 09/04/21: flexion 85 7/17:  shoulder flexion to 90; 12/25/21: 105 (P 125); 03/12/22: active L shoulder flexion 110 (pt uses R arm to reach for items at shoulder height or above); 04/16/22: L shoulder  flexion 107; 04/25/22: L shoulder flexion 125*, R 132*    Time 12    Period Weeks    Status achieved   Target Date 04/25/22     OT LONG TERM GOAL #6   Title Pt will improve FOTO score to 47 or above to demonstrate a clinically relevant change in function to impact ADL tasks.    Baseline score of 30 at eval; 08/17/21: FOTO: 42; 09/04/21: FOTO : 54 , FOTO 7/7:  60; 12/25/21: 59; 03/12/22: 66; 04/16/22: 67; 04/25/22; 51; 05/21/22: 74; 07/04/22: 74; 07/18/22: 71; 08/20/22: 77   Time 12    Period Weeks    Status  MET/ongoing    Target Date 10/10/22     OT LONG TERM GOAL #7   Title Pt will demonstrate ability to pick up small objects with L hand and complete 9 hole peg test in less than 1 min (revised)   Baseline unable to perform at eval, 10th visit: still unable to pick up small pegs, 20th: unable to pick up pegs but improving; 08/17/21: not attempted d/t time constraints, will assess next visit; 09/04/21: 9 hole peg test in 5 min 53 sec;  7/17:  Pt unable to complete this date but was able to place 6 of 9 pegs in 4 mins 20 secs; 12/25/21: L 1 min 43 sec; 03/12/22: L 1 min 2 sec (requires non-skid surface to pick up small items from table top); 04/16/22: 5 pegs in 2 min 36 secs; 04/25/22: 1 min 48 sec; 05/21/22: L 1 min 23 sec; 07/04/22: 1 min 14 sec; 08/20/22: Able to complete 4 pegs in 3 min before OT stopped test (declined LUE motor skills since ED visit Friday 08/16/22 d/t adverse reaction to a new  med)   Time 12    Period Weeks    Status Revised/On-going    Target Date 10/10/22    OT LONG TERM GOAL #8   Title Pt will increase LUE strength by 1 MM grade in order to hold blow dryer in LUE to dry hair without dropping dryer.   Baseline 04/25/22: Unable to sustain grip and maintain lift of LUE with hand near head without dropping dryer.  Dryer has caused bruising above L eye with previous attempts.  L shoulder flex/abd 3/5, elbow flex/ext 3+/5; 07/04/22: L shoulder flex/abd 3+, elbow and wrist flex/ext 4+   Time 12     Period Weeks    Status New/ ongoing   Target Date 10/10/22        OT LONG TERM GOAL #9   Title Pt will increase L hand dexterity to enable pt to independently manage clothing fasteners.   Baseline 04/25/22: pt ties shoe laces loosely and requires assist from spouse occasionally.  When typing, pt engages the L hand for ~25% of the task.  Pt avoids wearing jeans d/t inability to manage button or zipper; 07/04/22: no change yet from 04/25/22; 07/18/22: Managed zipper today, but increased time and effort to manage buttons on pants; able to tie shoes loosely with increased time; 08/20/23: better efficiency with managing zipper on a jacket; pt manages buttons 1 handed (R); able to tie shoes without laces being loose   Time 12    Period Weeks    Status Ongoing   Target Date 10/10/22        OT LONG TERM GOAL #10   Title Pt will be able to independently carry a light plate of food or drink in L hand without spilling/dropping to increase efficiency with item transport in the kitchen.   Baseline 04/25/22: Pt requires constant cues to keep cup or plate level while multitasking in order to prevent spilling; 07/04/22: Pt reports she is carrying her coffee cup from kitchen to neighboring room (not yet consistent to avoid spills); 08/20/22: Pt has not been using L hand to carry items much since Friday d/t increased weakness since ED visit.   Time 12    Period Weeks    Status New/ongoing   Target Date 10/10/22     Plan -     Clinical Impression Statement Pt reports she dropped a cup from her L hand this morning when trying to hold a cup in her L hand and a cup in her R hand at the same time.  Reinforced importance of keeping attention and sight on L hand when holding/carrying items.  When manipulating small pegs in L hand, pt practiced initially with removing 1 peg at a time for a warm up, then placing 1 peg at a time, progressing to removing a row of pegs, storing in hand, and scooping up to 3-4 from table top from a  group of 5 into palm.  Pt is improving with ability to scoop multiple items up from table top and is dropping fewer items from ulnar side of hand when working on storage of smaller items in palm as a result of improved active supination and improved composite fist.  Pt will continue to work towards goals in plan of care to improve left UE functional use in ADL and IADL tasks at home and in the community.    OT Occupational Profile and History Detailed Assessment- Review of Records and additional review of physical, cognitive, psychosocial history related to current functional  performance    Occupational performance deficits (Please refer to evaluation for details): ADL's;IADL's;Leisure;Rest and Sleep    Body Structure / Function / Physical Skills ADL;Coordination;Endurance;GMC;UE functional use;Balance;IADL;Pain;Dexterity;FMC;Strength;Edema;Mobility;ROM    Psychosocial Skills Environmental  Adaptations;Habits;Routines and Behaviors    Rehab Potential Good    Clinical Decision Making Several treatment options, min-mod task modification necessary   Comorbidities Affecting Occupational Performance: May have comorbidities impacting occupational performance    Modification or Assistance to Complete Evaluation  Min-Moderate modification of tasks or assist with assess necessary to complete eval    OT Frequency 2x / week    OT Duration 12 weeks    OT Treatment/Interventions Self-care/ADL training;Cryotherapy;Paraffin;Therapeutic exercise;DME and/or AE instruction;Functional Mobility Training;Balance training;Electrical Stimulation;Ultrasound;Neuromuscular education;Manual Therapy;Splinting;Moist Heat;Contrast Bath;Passive range of motion;Therapeutic activities;Patient/family education;Coping strategies training    Plan OT recert    Consulted and Agree with Plan of Care Patient           Danelle Earthly, MS, OTR/L  [employer] Dials, OT 09/03/2022, 10:30 AM

## 2022-09-04 LAB — CUP PACEART REMOTE DEVICE CHECK
Date Time Interrogation Session: 20240610230458
Implantable Pulse Generator Implant Date: 19440924

## 2022-09-04 NOTE — Therapy (Signed)
OUTPATIENT PHYSICAL THERAPY NEURO TREATMENT   Patient Name: Teresa [name] MRN: [id number] DOB:1940-07-16, 82 y.o., female Today's Date: 09/05/2022   PCP: Dr. Aram Beecham REFERRING PROVIDER: Dr. Aram Beecham  END OF SESSION:  PT End of Session - 09/05/22 1258     Visit Number 24    Number of Visits 41    Date for PT Re-Evaluation 11/05/22    Authorization Type [employer] PPO-    Authorization Time Period 05/23/2022- 08/15/2022    Progress Note Due on Visit 10    PT Start Time 1300    PT Stop Time 1344    PT Time Calculation (min) 44 min    Equipment Utilized During Treatment Gait belt    Activity Tolerance Patient tolerated treatment well    Behavior During Therapy WFL for tasks assessed/performed                           Past Medical History:  Diagnosis Date   Cancer (HCC)    skin   Hypothyroidism    Raynaud's disease    Stroke [facility])    Past Surgical History:  Procedure Laterality Date   ABDOMINAL HYSTERECTOMY  1991   APPENDECTOMY  1991   BACK SURGERY  2010   Cervical fusion C4-5-6-7   CATARACT EXTRACTION, BILATERAL Bilateral 10/2016   CHOLECYSTECTOMY  1995   COLONOSCOPY WITH PROPOFOL N/A 12/08/2018   Procedure: COLONOSCOPY WITH PROPOFOL;  Surgeon: Toledo, Boykin Nearing, MD;  Location: [facility] ENDOSCOPY;  Service: Gastroenterology;  Laterality: N/A;   EYE SURGERY     Patient Active Problem List   Diagnosis Date Noted   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   Stroke (HCC) 02/27/2021   History of nonmelanoma skin cancer 05/23/2014    ONSET DATE: 04/16/2022  REFERRING DIAG: 69.354 (ICD-10-CM) - Hemiplegia and hemiparesis following cerebral infarction affecting left non-dominant side   THERAPY DIAG:  Muscle weakness (generalized)  Right middle cerebral artery stroke (HCC)  Difficulty in walking, not elsewhere classified  Unsteadiness on feet  Rationale for Evaluation and Treatment: Rehabilitation  SUBJECTIVE:                                                                                                                                                                                              SUBJECTIVE STATEMENT: No stumbles or falls since last session. Has been compliant with HEP.     Pt accompanied by: self  PERTINENT HISTORY: Pt is a 82 y.o. female with referral for hemiplegia with left sided weakness -original CVA on 02/26/21 and new onset  of left sided weakness on 04/16/2022.  R MCA CVA on 02/26/21. Pt completed outpatient PT on 01/17/2022 with majority of goals met. PMH includes: skin cancer, hypothyroidism, and Raynaud's disease, history of cervical fusion in 2010 C4-C7.   PAIN:  Are you having pain?  no  PRECAUTIONS: Fall  WEIGHT BEARING RESTRICTIONS: No  FALLS: Has patient fallen in last 6 months? No  LIVING ENVIRONMENT: Lives with: lives with their spouse Lives in: House/apartment Stairs: Yes: External: 1 steps; none Has following equipment at home: Single point cane, Walker - 2 wheeled, shower chair, and Grab bars  PLOF: Independent  PATIENT GOALS: I want to walk without this cane and no falls and enjoy going out to grandkids sporting event  OBJECTIVE:   DIAGNOSTIC FINDINGS:   5/24:  EXAM: CT HEAD WITHOUT CONTRAST  IMPRESSION: 1. No evidence of acute intracranial abnormality. 2. Remote right MCA territory infarct.     CLINICAL DATA:  Stroke suspected   EXAM: MRI HEAD WITHOUT CONTRAST   TECHNIQUE: Multiplanar, multiecho pulse sequences of the brain and surrounding structures were obtained without intravenous contrast.   COMPARISON:  MRI Head 02/26/21, CT Head 04/16/22   FINDINGS: Brain: Evolving right MCA territory infarct involving the right frontal operculum in the right insular cortex. No acute infarction, hemorrhage, hydrocephalus, extra-axial collection or mass lesion. Small focus of microhemorrhage in the left temporal lobe. There is petechial  hemorrhage and/or hemosiderosis in the region of prior right MCA territory infarct. T2/stir hyperintense signal in the right cerebral peduncle is favored to represent transneuronal degeneration.   Vascular: Major flow voids are preserved.   Skull and upper cervical spine: Normal marrow signal.   Sinuses/Orbits: Bilateral lens replacement. No mastoid or middle ear effusion. Paranasal sinuses are clear.   Other: None.   IMPRESSION: 1. No acute intracranial abnormality. 2. 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 3.     Electronically Signed   By: Lorenza Cambridge M.D.   On: 04/16/2022 13:41  COGNITION: Overall cognitive status: Within functional limits for tasks assessed   SENSATION: WFL  COORDINATION: Intact with each LE  EDEMA:  None observed  MUSCLE TONE: LLE: Within functional limits  DTRs:  Patella 2+ = Normal and Achilles 2+ = Normal  POSTURE: rounded shoulders and forward head  LOWER EXTREMITY ROM:     Active  Right Eval Left Eval  Hip flexion WNL THROUGHOUT LE WNL THROUGHOUT LE  Hip extension    Hip abduction    Hip adduction    Hip internal rotation    Hip external rotation    Knee flexion    Knee extension    Ankle dorsiflexion    Ankle plantarflexion    Ankle inversion    Ankle eversion     (Blank rows = not tested)  LOWER EXTREMITY MMT:    MMT Right Eval Left Eval  Hip flexion 4+ 4  Hip extension 4+ 4  Hip abduction 4+ 4  Hip adduction 4+ 4  Hip internal rotation 4+ 4  Hip external rotation 4+ 4  Knee flexion 4+ 4  Knee extension 5 4  Ankle dorsiflexion 5 4  Ankle plantarflexion    Ankle inversion    Ankle eversion    (Blank rows = not tested)  BED MOBILITY:  Patient reports independent with all bed mobility and states has returned to master bed  TRANSFERS: Assistive device utilized: None  Sit to stand: Complete Independence Stand to sit: Complete  Independence Chair to chair: Complete Independence Floor:  Not tested   GAIT: Gait pattern: step through pattern, decreased arm swing- Right, decreased arm swing- Left, decreased step length- Right, and decreased step length- Left Distance walked: 915 feet Assistive device utilized: None Level of assistance: SBA Comments: occasional scuff or decreased left foot clearance  FUNCTIONAL TESTS:  5 times sit to stand: 23.62 sec without UE support Timed up and go (TUG): 20.14 sec without an AD 10 meter walk test: 0.70 m/s  PATIENT SURVEYS:  FOTO 60/ goal of 66  TODAY'S TREATMENT:                                                                                                                              DATE: 09/05/22    NMR: Airex balance: -lateral step 8x length with one episode of cueing for picking up feet -static stand eyes closed 60 seconds  -dual task word game x8 visual scanning and reaching  NBOS while lateral weight shifting to move colored letter into top left and then right corner to challenge ability to reach- diagonal overhead while standing on unstable surface.   Seated on dynadisc: 4; step at feet  -static position 60 seconds -lateral side bend 15x -anterior/posterior tilts 15x  TE: #3 ankle weights: step over hurdle and back SUE support cue for heel strike toe strike 15x each side  Seated: Dynadisc: df/pf 20x each LE  Dynadisc: clockwise 10x, counterclockwise 10x  Seated RTB DF 15x   -Overground endurance training. Pt ambulated approx 500 feet. Increased VC with distance for reminders to pick up left foot with 6 instance of significant drag and physical assist to maintain upright balance. # 3 ankle weights ; x 2 trials ; second trial 4 episodes of L  foot drag     PATIENT EDUCATION: Education details: Pt educated throughout session about proper posture and technique with exercises. Improved exercise technique, movement at target joints, use of target muscles  after min to mod verbal, visual, tactile cues.  Person educated: Patient Education method: Explanation, Demonstration, Tactile cues, and Verbal cues Education comprehension: verbalized understanding, returned demonstration, verbal cues required, tactile cues required, and needs further education  HOME EXERCISE PROGRAM: To be updated next 1-2 visit  GOALS: Goals reviewed with patient? Yes  SHORT TERM GOALS: Target date: 07/04/2022  Patient will be independent in home exercise program to improve strength/mobility for better functional independence with ADLs  Baseline: EVAL: Patient reports mostly just walking; 07/09/22: Pt reports her exercises are going well, to be updated future date Goal status: IN PROGRESS  LONG TERM GOALS: Target date: 11/05/2022    Pt will decrease 5TSTS by at least 6 seconds in order to demonstrate clinically significant improvement in LE strength. Baseline: EVAL= 23.62 sec without UE; 07/09/22:  13 seconds hands-free  Goal status: MET  2. Pt will improve FOTO to target score of 66 to display perceived improvements in ability to complete ADL's.  Baseline: Eval= 60; 07/09/22: 65 5/21: 59% Goal status: PARTIALLY MET  3.  Pt will decrease TUG to below 14 seconds/decrease in order to demonstrate decreased fall risk. Baseline: Eval= 20.14 sec without AD; 07/09/22: 11 seconds no AD Goal status: MET  4.  Patient will increase six minute walk test distance to >1200 ft for improve gait ability and return to PLOF to complete her walks in her neighborhood.  Baseline: EVAL= 915 feet without an AD; 07/09/22: 1184 ft without AD 5/21: 1060 ft  Goal status: Partially MET  5. Pt will increase by at least 0.13 m/s in order to demonstrate clinically significant improvement in community ambulation.    Baseline: Eval = 0.70 m/s; 07/09/2022: 1.04 m/s without AD   Goal status: MET   6. Pt will report that she has not hit LLE/LUE on doorframe/walls/furniture while ambulating in  the past two weeks to indicate improved ability to scan environment and to decrease injury risk with gait.  Baseline: currently has hit furniture/doorframe 2x/week 5/21: hitting doorframe less frequently. 08/23/2022- Patient reports no bumping arms into any walls or furniture since last tested on 08/13/2022.   Goal status: Ongoing  7. Patient will increase Functional Gait Assessment score to >20/30 as to reduce fall risk and improve dynamic gait safety with community ambulation.  Baseline:5/21: 13/30  Goal status: NEW   ASSESSMENT:  CLINICAL IMPRESSION: Patient has multiple episodes of foot drag when distracted with ambulation with weight. When focused she is able to continue the heel strike focused task. She is highly motivated throughout session. Ankle strengthening interventions tolerated well in seated position and seated on dynadisc is very challenging for core for stabilization carryover to standing.The pt will benefit from skilled PT services to improve her overall left LE muscle strength and improve her mobility to decrease her fall risk and improve her overall quality of life.   OBJECTIVE IMPAIRMENTS: Abnormal gait, decreased activity tolerance, decreased balance, decreased coordination, decreased endurance, decreased mobility, difficulty walking, and decreased strength.   ACTIVITY LIMITATIONS: carrying, lifting, bending, standing, squatting, stairs, and transfers  PARTICIPATION LIMITATIONS: cleaning, laundry, driving, shopping, community activity, and yard work  PERSONAL FACTORS: 3+ comorbidities: OA, HTN, hx of previous CVA  are also affecting patient's functional outcome.   REHAB POTENTIAL: Good  CLINICAL DECISION MAKING: Evolving/moderate complexity  EVALUATION COMPLEXITY: Moderate  PLAN:  PT FREQUENCY: 1-2x/week  PT DURATION: 12 weeks  PLANNED INTERVENTIONS: Therapeutic exercises, Therapeutic activity, Neuromuscular re-education, Balance training, Gait training,  Patient/Family education, Self Care, Joint mobilization, Joint manipulation, Stair training, Vestibular training, Canalith repositioning, DME instructions, Dry Needling, Electrical stimulation, Spinal manipulation, Spinal mobilization, Cryotherapy, Moist heat, Taping, and Manual therapy  PLAN FOR NEXT SESSION:   Progress gait training to include dual-cog tasks, BLE endurance & dynamic balance.  Monitor BP and continue to assess for worsening s/s of stroke. Continue plan   Precious Bard PT  [facility]  09/05/2022, 2:48 PM   Physical Therapist - [facility]  Outpatient Physical Therapy- Main Campus [phone]

## 2022-09-05 ENCOUNTER — Ambulatory Visit: Payer: Medicare HMO

## 2022-09-05 DIAGNOSIS — R2689 Other abnormalities of gait and mobility: Secondary | ICD-10-CM | POA: Diagnosis not present

## 2022-09-05 DIAGNOSIS — M6281 Muscle weakness (generalized): Secondary | ICD-10-CM

## 2022-09-05 DIAGNOSIS — R278 Other lack of coordination: Secondary | ICD-10-CM | POA: Diagnosis not present

## 2022-09-05 DIAGNOSIS — I63511 Cerebral infarction due to unspecified occlusion or stenosis of right middle cerebral artery: Secondary | ICD-10-CM | POA: Diagnosis not present

## 2022-09-05 DIAGNOSIS — R262 Difficulty in walking, not elsewhere classified: Secondary | ICD-10-CM

## 2022-09-05 DIAGNOSIS — R2681 Unsteadiness on feet: Secondary | ICD-10-CM | POA: Diagnosis not present

## 2022-09-06 NOTE — Therapy (Signed)
OUTPATIENT OCCUPATIONAL THERAPY NEURO TREATMENT NOTE        Patient Name: Teresa [name] MRN: [id number] DOB:[birth year], 82 y.o., female Today's Date: 05/23/2022  PCP: Dr. Aram Beecham REFERRING PROVIDER: Dr. Aram Beecham   OT End of Session - 09/06/22 1312     Visit Number 125    Number of Visits 138    Date for OT Re-Evaluation 10/10/22    Authorization Time Period Reporting period beginning 08/20/22    Progress Note Due on Visit 10    OT Start Time 1345    OT Stop Time 1430    OT Time Calculation (min) 45 min    Equipment Utilized During Treatment cane    Activity Tolerance Patient tolerated treatment well    Behavior During Therapy WFL for tasks assessed/performed            Past Medical History:  Diagnosis Date   Cancer (HCC)    skin   Hypothyroidism    Raynaud's disease    Stroke [facility])    Past Surgical History:  Procedure Laterality Date   ABDOMINAL HYSTERECTOMY  1991   APPENDECTOMY  1991   BACK SURGERY  2010   Cervical fusion C4-5-6-7   CATARACT EXTRACTION, BILATERAL Bilateral 10/2016   CHOLECYSTECTOMY  1995   COLONOSCOPY WITH PROPOFOL N/A 12/08/2018   Procedure: COLONOSCOPY WITH PROPOFOL;  Surgeon: Toledo, Boykin Nearing, MD;  Location: [facility] ENDOSCOPY;  Service: Gastroenterology;  Laterality: N/A;   EYE SURGERY     Patient Active Problem List   Diagnosis Date Noted   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  Following inpt  rehab, pt went home and had home health.   PRECAUTIONS: fall  SUBJECTIVE: Pt reports her L hand feels stiff today, but demonstrates no difficulty removing her rings.   PAIN:  Are you having pain? none  OBJECTIVE:  L grip 6, R grip 41 L lateral pinch 5, R 13 L 3 point pinch 4, R 14  L 9 hole 1 min 43 sec. L shoulder active flexion 0-105, passive 0-125 02/05/22: L grip 13#  L lateral pinch 8#  3 point pinch 5#  L shoulder flex 0-108 03/12/22:  L grip: 12# L lateral pinch: 8# 3 point pinch: 6# L 9 hole: 1 min 3 sec L shoulder flexion 0-110 04/16/22: L grip: 3# L lateral pinch: 4# 3 point pinch: difficulty maintaining 3 point pinch  L 9 hole: 5 pegs in 2 min 36 sec  L shoulder flexion 0-107 BP L 132/79 HR 80 02 sats 96% 04/25/22: L grip: 14#; R grip: 50# L lateral 2#, R lateral 17# 3 point pinch: L 4#, R 14# (Saehan pinch gauge) 9 hole peg test: 1 min, 48 sec L shoulder flex 125 (R shoulder 132 active) 05/21/22: L grip: 15#; R grip: 50# L lateral 8#, R lateral 20# 3 point pinch: L 4#, R 12# (Saehan pinch gauge) 9 hole peg test: 1 min, 23 sec 07/04/22: L grip: 19#  L lateral 10# 3 point pinch: L 6# (standard pinch gauge-not Saehan) 9 hole peg test: 1 min, 14 sec 07/18/22:  L grip 22# L lateral: 9# L 3 point pinch: 6# L 9 hole: 1 min 8 sec 08/20/22: (Feeling sluggish since 08/16/22 after ED visit for medication adverse reaction) L grip: 21# L lateral: 7# L 3 point pinch: 7# (standard pinch gauge) L 9 hole: 4 pegs in 3 min (OT stopped at 3 min).  08/23/22:  L 9 hole: 1 min 12 sec  TODAY'S TREATMENT: Therapeutic Exercise: Facilitated pinch strengthening with use of therapy resistant clothespins to target lateral and 3 point pinch of L hand.  Able to pinch yellow, red (light resistance), green, blue (moderate resistance) with good tolerance, and black pins (heavy resistance) with increased effort and multiple trials.  Pt completed 1 trial of each color for each pinch  type using L hand to clip pins on/off vertical dowel.  Pt required intermittent min A to formulate a lateral pinch pattern correctly, and intermittent visual demo and min vc to correctly formulate a 3 point pinch pattern.  Therapeutic Activity: Facilitated [facility] and dexterity skills with the L hand, working to pick up glass stones from dish, store in palm, and use translatory movements moving [place] stones between palm and fingertips in prep for discarding stones 1 at a time into dish.  Pt practiced attempts at scooping up to 4 at a time into palm, typically scooping 2-3.   PATIENT EDUCATION: Education details: Anticipate feelings of "stiffness" in L hand likely d/t numbness/tingling as OT mobilized hand without noting stiffness (able to easily make composite fist) and pt was able to easily remove rings and place back on her L hand fingers.   Person educated: pt Education method: explanation Education comprehension: verbalized understanding  HOME EXERCISE PROGRAM Continue to engage LUE into ADLs; continue use of putty for gripping and pinching exercises for LUE, and L shoulder AROM/AAROM, crocheting, typing; increase participation in IADL tasks for greater use of L hand.   OT Short Term Goals - 08/29/22 (6 weeks)      OT SHORT TERM GOAL #1   Title Pt will be independent with home exercise program.    Baseline Eval: no current program, 10th visit:  continue to add new exercises as pt progresses, 20th:  continue to update HEP; 08/17/21: continue to progress HEP when indicated.  Visit 40: adding new exercises as pt progresses; 12/25/21: ongoing with progressions; 03/12/22: inconsistent use of putty but regular use of stress ball (every other day).  Encouraged pt return to daily putty use (2x daily when able) to target grip and pinch strengthening; 04/16/22: not reviewed today d/t OT sent pt to ED; 04/25/22: added pulleys and pt has re-started putty; 05/21/22: stopped pulleys and added stronger theraputty and  encouraged pt crochet often; 07/04/22: pt working consistently with putty and grip strengthener at 20# at home; 07/18/22: pt continues to work on putty and Building surveyor and will be starting with her crocheting again to progress [facility] skills; 08/20/22: Pt continues to use her grip strengthener daily set at 20#; will continue to modify as needed   Time 12    Period Weeks    Status On-going    Target Date 08/29/22     OT LONG TERM GOAL #2   Title Pt will complete UB and LB dressing with modified independence including buttons, snaps and zippers.    Baseline requires min assist at eval, 10th visit: occasional assist with buttons,  20th:  able to perform one handed, but difficulty with bilateral UE; 08/17/21: pt reports inconsistent with 1 hand, reviewed techniques this visit, Visit 40:  Pt requires assist with bra   Time 12    Period Weeks    Status MET   Target Date 11/08/21     OT LONG TERM GOAL #3   Title Pt will perform shower transfer with modified independence.    Baseline Pt requires supervision to min assist for shower transfer at home. 10th visit: supervision; 08/17/21: supv .  Visit 40:  Pt had met goal but had recent fall and now requiring min guard to supv   Time 6    Period Weeks    Status  Met    Target Date 11/08/21      OT LONG TERM GOAL #4 10/10/22 (12 weeks)    Title Pt will improve L hand grip to 25 or more #s to enable pt to open a new jar.   Baseline no grip in left hand at eval, 10th visit:  improved flexion but still working towards composite fisting and grip. 20th:  continues to demo decreased grip; 08/17/21: active digit flexion improving, but not yet able to register grip on dynamometer; 09/04/21: L grip 1# 7/17:  5#; 12/25/21: L grip 6#; 02/05/22: 13#; 03/12/22: L grip 12#; 1/23//24: L grip 3# (decreased); 04/25/22: L grip 14#; spouse assists to open new jars; 05/21/22: L grip 15#; 07/04/22: L grip 19#; 07/18/22: L grip 22#, difficulty opening a new jar (R grip 50#); 08/20/22: L grip  21# (increased weakness since Friday after adverse reaction to a new medication resulting in ED visit).   Time 12    Period Weeks    Status Revised/ongoing   Target Date 10/10/22     OT LONG TERM GOAL #5   Title Pt will improve left shoulder flexion to 100 degrees or better to improve reaching to obtain self care items from shelf/shoulder height.    Baseline difficulty with reach, shoulder flexion to 47 degrees; 08/17/21: L shoulder flexion 85, but not yet able to consistently hold ADL supplies in L hand when reaching; 09/04/21: flexion 85 7/17:  shoulder flexion to 90; 12/25/21: 105 (P 125); 03/12/22: active L shoulder flexion 110 (pt uses R arm to reach for items at shoulder height or above); 04/16/22: L shoulder flexion 107; 04/25/22: L shoulder flexion 125*, R 132*    Time 12    Period Weeks    Status achieved   Target Date 04/25/22     OT LONG TERM GOAL #6   Title Pt will improve FOTO score to 47 or above to demonstrate a clinically relevant change in function to impact ADL tasks.    Baseline score of 30 at eval; 08/17/21: FOTO: 42; 09/04/21: FOTO : 54 , FOTO 7/7:  60; 12/25/21: 59; 03/12/22: 66; 04/16/22: 67; 04/25/22; 51; 05/21/22: 74; 07/04/22: 74; 07/18/22: 71; 08/20/22: 77   Time 12    Period Weeks    Status  MET/ongoing    Target Date 10/10/22     OT LONG TERM GOAL #7   Title Pt will demonstrate ability to pick up small objects with L hand and complete 9 hole peg test in less than 1 min (revised)   Baseline unable to perform at eval, 10th visit: still unable to pick up small pegs, 20th: unable to pick up pegs but improving; 08/17/21: not attempted d/t time constraints, will assess next visit; 09/04/21: 9 hole peg test in 5  min 53 sec;  7/17:  Pt unable to complete this date but was able to place 6 of 9 pegs in 4 mins 20 secs; 12/25/21: L 1 min 43 sec; 03/12/22: L 1 min 2 sec (requires non-skid surface to pick up small items from table top); 04/16/22: 5 pegs in 2 min 36 secs; 04/25/22: 1 min 48 sec; 05/21/22:  L 1 min 23 sec; 07/04/22: 1 min 14 sec; 08/20/22: Able to complete 4 pegs in 3 min before OT stopped test (declined LUE motor skills since ED visit Friday 08/16/22 d/t adverse reaction to a new med)   Time 12    Period Weeks    Status Revised/On-going    Target Date 10/10/22    OT LONG TERM GOAL #8   Title Pt will increase LUE strength by 1 MM grade in order to hold blow dryer in LUE to dry hair without dropping dryer.   Baseline 04/25/22: Unable to sustain grip and maintain lift of LUE with hand near head without dropping dryer.  Dryer has caused bruising above L eye with previous attempts.  L shoulder flex/abd 3/5, elbow flex/ext 3+/5; 07/04/22: L shoulder flex/abd 3+, elbow and wrist flex/ext 4+   Time 12    Period Weeks    Status New/ ongoing   Target Date 10/10/22        OT LONG TERM GOAL #9   Title Pt will increase L hand dexterity to enable pt to independently manage clothing fasteners.   Baseline 04/25/22: pt ties shoe laces loosely and requires assist from spouse occasionally.  When typing, pt engages the L hand for ~25% of the task.  Pt avoids wearing jeans d/t inability to manage button or zipper; 07/04/22: no change yet from 04/25/22; 07/18/22: Managed zipper today, but increased time and effort to manage buttons on pants; able to tie shoes loosely with increased time; 08/20/23: better efficiency with managing zipper on a jacket; pt manages buttons 1 handed (R); able to tie shoes without laces being loose   Time 12    Period Weeks    Status Ongoing   Target Date 10/10/22        OT LONG TERM GOAL #10   Title Pt will be able to independently carry a light plate of food or drink in L hand without spilling/dropping to increase efficiency with item transport in the kitchen.   Baseline 04/25/22: Pt requires constant cues to keep cup or plate level while multitasking in order to prevent spilling; 07/04/22: Pt reports she is carrying her coffee cup from kitchen to neighboring room (not yet consistent  to avoid spills); 08/20/22: Pt has not been using L hand to carry items much since Friday d/t increased weakness since ED visit.   Time 12    Period Weeks    Status New/ongoing   Target Date 10/10/22     Plan -     Clinical Impression Statement Pt continues to report stiffness in L hand, though anticipate feelings of "stiffness" in L hand likely d/t numbness/tingling as OT mobilized hand without noting stiffness (able to easily make composite fist) and pt was able to easily remove rings and place back on her L hand fingers.  Continued focus on L hand strength and dexterity skills for better manipulation of ADL supplies.  Pt was challenged with black most resistive pins in the L hand with both pinch types, and required extra effort and trials, with intermittent min A or visual demo and vc to formulate  pinch patterns correctly.  Pt will continue to work towards goals in plan of care to improve left UE functional use in ADL and IADL tasks at home and in the community.    OT Occupational Profile and History Detailed Assessment- Review of Records and additional review of physical, cognitive, psychosocial history related to current functional performance    Occupational performance deficits (Please refer to evaluation for details): ADL's;IADL's;Leisure;Rest and Sleep    Body Structure / Function / Physical Skills ADL;Coordination;Endurance;GMC;UE functional use;Balance;IADL;Pain;Dexterity;FMC;Strength;Edema;Mobility;ROM    Psychosocial Skills Environmental  Adaptations;Habits;Routines and Behaviors    Rehab Potential Good    Clinical Decision Making Several treatment options, min-mod task modification necessary   Comorbidities Affecting Occupational Performance: May have comorbidities impacting occupational performance    Modification or Assistance to Complete Evaluation  Min-Moderate modification of tasks or assist with assess necessary to complete eval    OT Frequency 2x / week    OT Duration 12 weeks     OT Treatment/Interventions Self-care/ADL training;Cryotherapy;Paraffin;Therapeutic exercise;DME and/or AE instruction;Functional Mobility Training;Balance training;Electrical Stimulation;Ultrasound;Neuromuscular education;Manual Therapy;Splinting;Moist Heat;Contrast Bath;Passive range of motion;Therapeutic activities;Patient/family education;Coping strategies training    Plan OT recert    Consulted and Agree with Plan of Care Patient           Danelle Earthly, MS, OTR/L  [employer] Dials, OT 09/06/2022, 1:13 PM

## 2022-09-09 ENCOUNTER — Ambulatory Visit (INDEPENDENT_AMBULATORY_CARE_PROVIDER_SITE_OTHER): Payer: Medicare HMO

## 2022-09-09 DIAGNOSIS — I639 Cerebral infarction, unspecified: Secondary | ICD-10-CM

## 2022-09-09 NOTE — Therapy (Signed)
OUTPATIENT PHYSICAL THERAPY NEURO TREATMENT   Patient Name: Teresa [name] MRN: [id number] DOB:[birth year], 82 y.o., female Today's Date: 09/10/2022   PCP: Dr. Aram Beecham REFERRING PROVIDER: Dr. Aram Beecham  END OF SESSION:  PT End of Session - 09/10/22 1103     Visit Number 25    Number of Visits 41    Date for PT Re-Evaluation 11/05/22    Authorization Type [employer] PPO-    Authorization Time Period 05/23/2022- 08/15/2022    Progress Note Due on Visit 10    PT Start Time 1100    PT Stop Time 1145    PT Time Calculation (min) 45 min    Equipment Utilized During Treatment Gait belt    Activity Tolerance Patient tolerated treatment well    Behavior During Therapy WFL for tasks assessed/performed                            Past Medical History:  Diagnosis Date   Cancer (HCC)    skin   Hypothyroidism    Raynaud's disease    Stroke [facility])    Past Surgical History:  Procedure Laterality Date   ABDOMINAL HYSTERECTOMY  1991   APPENDECTOMY  1991   BACK SURGERY  2010   Cervical fusion C4-5-6-7   CATARACT EXTRACTION, BILATERAL Bilateral 10/2016   CHOLECYSTECTOMY  1995   COLONOSCOPY WITH PROPOFOL N/A 12/08/2018   Procedure: COLONOSCOPY WITH PROPOFOL;  Surgeon: Toledo, Boykin Nearing, MD;  Location: [facility] ENDOSCOPY;  Service: Gastroenterology;  Laterality: N/A;   EYE SURGERY     Patient Active Problem List   Diagnosis Date Noted   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   Stroke (HCC) 02/27/2021   History of nonmelanoma skin cancer 05/23/2014    ONSET DATE: 04/16/2022  REFERRING DIAG: 69.354 (ICD-10-CM) - Hemiplegia and hemiparesis following cerebral infarction affecting left non-dominant side   THERAPY DIAG:  Muscle weakness (generalized)  Right middle cerebral artery stroke (HCC)  Difficulty in walking, not elsewhere classified  Unsteadiness on feet  Other lack of coordination  Other abnormalities of gait and  mobility  Rationale for Evaluation and Treatment: Rehabilitation  SUBJECTIVE:                                                                                                                                                                                             SUBJECTIVE STATEMENT: Patient reports doing well overall. States feeling about 50% better overall.    Pt accompanied by: self  PERTINENT HISTORY: Pt is a 82 y.o. female with referral for hemiplegia  with left sided weakness -original CVA on 02/26/21 and new onset of left sided weakness on 04/16/2022.  R MCA CVA on 02/26/21. Pt completed outpatient PT on 01/17/2022 with majority of goals met. PMH includes: skin cancer, hypothyroidism, and Raynaud's disease, history of cervical fusion in 2010 C4-C7.   PAIN:  Are you having pain?  no  PRECAUTIONS: Fall  WEIGHT BEARING RESTRICTIONS: No  FALLS: Has patient fallen in last 6 months? No  LIVING ENVIRONMENT: Lives with: lives with their spouse Lives in: House/apartment Stairs: Yes: External: 1 steps; none Has following equipment at home: Single point cane, Walker - 2 wheeled, shower chair, and Grab bars  PLOF: Independent  PATIENT GOALS: I want to walk without this cane and no falls and enjoy going out to grandkids sporting event  OBJECTIVE:   DIAGNOSTIC FINDINGS:   5/24:  EXAM: CT HEAD WITHOUT CONTRAST  IMPRESSION: 1. No evidence of acute intracranial abnormality. 2. Remote right MCA territory infarct.     CLINICAL DATA:  Stroke suspected   EXAM: MRI HEAD WITHOUT CONTRAST   TECHNIQUE: Multiplanar, multiecho pulse sequences of the brain and surrounding structures were obtained without intravenous contrast.   COMPARISON:  MRI Head 02/26/21, CT Head 04/16/22   FINDINGS: Brain: Evolving right MCA territory infarct involving the right frontal operculum in the right insular cortex. No acute infarction, hemorrhage, hydrocephalus, extra-axial collection or mass  lesion. Small focus of microhemorrhage in the left temporal lobe. There is petechial hemorrhage and/or hemosiderosis in the region of prior right MCA territory infarct. T2/stir hyperintense signal in the right cerebral peduncle is favored to represent transneuronal degeneration.   Vascular: Major flow voids are preserved.   Skull and upper cervical spine: Normal marrow signal.   Sinuses/Orbits: Bilateral lens replacement. No mastoid or middle ear effusion. Paranasal sinuses are clear.   Other: None.   IMPRESSION: 1. No acute intracranial abnormality. 2. 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 3.     Electronically Signed   By: Lorenza Cambridge M.D.   On: 04/16/2022 13:41  COGNITION: Overall cognitive status: Within functional limits for tasks assessed   SENSATION: WFL  COORDINATION: Intact with each LE  EDEMA:  None observed  MUSCLE TONE: LLE: Within functional limits  DTRs:  Patella 2+ = Normal and Achilles 2+ = Normal  POSTURE: rounded shoulders and forward head  LOWER EXTREMITY ROM:     Active  Right Eval Left Eval  Hip flexion WNL THROUGHOUT LE WNL THROUGHOUT LE  Hip extension    Hip abduction    Hip adduction    Hip internal rotation    Hip external rotation    Knee flexion    Knee extension    Ankle dorsiflexion    Ankle plantarflexion    Ankle inversion    Ankle eversion     (Blank rows = not tested)  LOWER EXTREMITY MMT:    MMT Right Eval Left Eval  Hip flexion 4+ 4  Hip extension 4+ 4  Hip abduction 4+ 4  Hip adduction 4+ 4  Hip internal rotation 4+ 4  Hip external rotation 4+ 4  Knee flexion 4+ 4  Knee extension 5 4  Ankle dorsiflexion 5 4  Ankle plantarflexion    Ankle inversion    Ankle eversion    (Blank rows = not tested)  BED MOBILITY:  Patient reports independent with all bed mobility and states has returned to master bed  TRANSFERS: Assistive device  utilized: None  Sit to stand: Complete Independence Stand to sit: Complete Independence Chair to chair: Complete Independence Floor:  Not tested   GAIT: Gait pattern: step through pattern, decreased arm swing- Right, decreased arm swing- Left, decreased step length- Right, and decreased step length- Left Distance walked: 915 feet Assistive device utilized: None Level of assistance: SBA Comments: occasional scuff or decreased left foot clearance  FUNCTIONAL TESTS:  5 times sit to stand: 23.62 sec without UE support Timed up and go (TUG): 20.14 sec without an AD 10 meter walk test: 0.70 m/s  PATIENT SURVEYS:  FOTO 60/ goal of 66  TODAY'S TREATMENT:                                                                                                                              DATE: 09/10/22    NMR:  Step tap onto 1st step without UE Support x 20 reps each  5 star tap x 8 each LE focusing on SLS with dynamic LE activity.    Activity Description: Random- 5 point star- SLS Activity Setting:  random Number of Pods:  5 Cycles/Sets:  3 Duration (Time or Hit Count):  Time Patient Stats  Reaction Time:  1) 22.48 2) 21.48 3) 23.48 sec 4) 25.22 sec 5) 22.24 sec  Standing Hip circles on one LE (swing around cone positioned on 1st step) while SLS on opp LE x 5 reps each LE x 2 sets   Heel walk- no UE Support and walk x 6 feet - down and back x 4 Toe walk-no UE Support and walk x 6 feet - down and back x 4  THEREX:  Seated figure 4 -each LE x 20 -30 sec hold x 2 Seated piriformis- each LE x 30 sec hold x 2 Seated hip IR (squeeze ball while separating feet with RTB around ankles) 2 sets of 10 (hold 3 sec) Sit to stand with right LE compromised (foot on top of airex pad) to emphasize Left LE power- x 10 reps         PATIENT EDUCATION: Education details: Pt educated throughout session about proper posture and technique with exercises. Improved exercise technique, movement at  target joints, use of target muscles after min to mod verbal, visual, tactile cues.  Person educated: Patient Education method: Explanation, Demonstration, Tactile cues, and Verbal cues Education comprehension: verbalized understanding, returned demonstration, verbal cues required, tactile cues required, and needs further education  HOME EXERCISE PROGRAM: To be updated next 1-2 visit  GOALS: Goals reviewed with patient? Yes  SHORT TERM GOALS: Target date: 07/04/2022  Patient will be independent in home exercise program to improve strength/mobility for better functional independence with ADLs  Baseline: EVAL: Patient reports mostly just walking; 07/09/22: Pt reports her exercises are going well, to be updated future date Goal status: IN PROGRESS  LONG TERM GOALS: Target date: 11/05/2022    Pt will decrease 5TSTS by at least 6 seconds in  order to demonstrate clinically significant improvement in LE strength. Baseline: EVAL= 23.62 sec without UE; 07/09/22:  13 seconds hands-free  Goal status: MET  2. Pt will improve FOTO to target score of 66 to display perceived improvements in ability to complete ADL's.  Baseline: Eval= 60; 07/09/22: 65 5/21: 59% Goal status: PARTIALLY MET  3.  Pt will decrease TUG to below 14 seconds/decrease in order to demonstrate decreased fall risk. Baseline: Eval= 20.14 sec without AD; 07/09/22: 11 seconds no AD Goal status: MET  4.  Patient will increase six minute walk test distance to >1200 ft for improve gait ability and return to PLOF to complete her walks in her neighborhood.  Baseline: EVAL= 915 feet without an AD; 07/09/22: 1184 ft without AD 5/21: 1060 ft  Goal status: Partially MET  5. Pt will increase by at least 0.13 m/s in order to demonstrate clinically significant improvement in community ambulation.    Baseline: Eval = 0.70 m/s; 07/09/2022: 1.04 m/s without AD   Goal status: MET   6. Pt will report that she has not hit LLE/LUE on  doorframe/walls/furniture while ambulating in the past two weeks to indicate improved ability to scan environment and to decrease injury risk with gait.  Baseline: currently has hit furniture/doorframe 2x/week 5/21: hitting doorframe less frequently. 08/23/2022- Patient reports no bumping arms into any walls or furniture since last tested on 08/13/2022.   Goal status: Ongoing  7. Patient will increase Functional Gait Assessment score to >20/30 as to reduce fall risk and improve dynamic gait safety with community ambulation.  Baseline:5/21: 13/30  Goal status: NEW   ASSESSMENT:  CLINICAL IMPRESSION: Patient performed well overall- challenged with single leg standing and dynamic activity but did well overall- improved with increased practice. She did exhibit some difficulty with left ankle DF with heel walk- improved with VC to concentrate. The pt will benefit from skilled PT services to improve her overall left LE muscle strength and improve her mobility to decrease her fall risk and improve her overall quality of life.   OBJECTIVE IMPAIRMENTS: Abnormal gait, decreased activity tolerance, decreased balance, decreased coordination, decreased endurance, decreased mobility, difficulty walking, and decreased strength.   ACTIVITY LIMITATIONS: carrying, lifting, bending, standing, squatting, stairs, and transfers  PARTICIPATION LIMITATIONS: cleaning, laundry, driving, shopping, community activity, and yard work  PERSONAL FACTORS: 3+ comorbidities: OA, HTN, hx of previous CVA  are also affecting patient's functional outcome.   REHAB POTENTIAL: Good  CLINICAL DECISION MAKING: Evolving/moderate complexity  EVALUATION COMPLEXITY: Moderate  PLAN:  PT FREQUENCY: 1-2x/week  PT DURATION: 12 weeks  PLANNED INTERVENTIONS: Therapeutic exercises, Therapeutic activity, Neuromuscular re-education, Balance training, Gait training, Patient/Family education, Self Care, Joint mobilization, Joint manipulation,  Stair training, Vestibular training, Canalith repositioning, DME instructions, Dry Needling, Electrical stimulation, Spinal manipulation, Spinal mobilization, Cryotherapy, Moist heat, Taping, and Manual therapy  PLAN FOR NEXT SESSION:   Progress gait training to include dual-cog tasks, BLE endurance & dynamic balance.  Monitor BP and continue to assess for worsening s/s of stroke. Continue plan   Lenda Kelp PT  [facility]  09/10/2022, 12:53 PM   Physical Therapist - [facility]  Outpatient Physical Therapy- Main Campus [phone]

## 2022-09-10 ENCOUNTER — Ambulatory Visit: Payer: Medicare HMO

## 2022-09-10 DIAGNOSIS — R262 Difficulty in walking, not elsewhere classified: Secondary | ICD-10-CM

## 2022-09-10 DIAGNOSIS — M6281 Muscle weakness (generalized): Secondary | ICD-10-CM | POA: Diagnosis not present

## 2022-09-10 DIAGNOSIS — I63511 Cerebral infarction due to unspecified occlusion or stenosis of right middle cerebral artery: Secondary | ICD-10-CM | POA: Diagnosis not present

## 2022-09-10 DIAGNOSIS — R278 Other lack of coordination: Secondary | ICD-10-CM

## 2022-09-10 DIAGNOSIS — R2681 Unsteadiness on feet: Secondary | ICD-10-CM | POA: Diagnosis not present

## 2022-09-10 DIAGNOSIS — R2689 Other abnormalities of gait and mobility: Secondary | ICD-10-CM | POA: Diagnosis not present

## 2022-09-10 NOTE — Therapy (Signed)
OUTPATIENT OCCUPATIONAL THERAPY NEURO TREATMENT NOTE        Patient Name: Teresa [name] MRN: [id number] DOB:[birth year], 82 y.o., female Today's Date: 05/23/2022  PCP: Dr. Aram Beecham REFERRING PROVIDER: Dr. Aram Beecham   OT End of Session - 09/10/22 1234     Visit Number [id number]    Number of Visits 138    Date for OT Re-Evaluation 10/10/22    Authorization Time Period Reporting period beginning 08/20/22    Progress Note Due on Visit 10    OT Start Time 1015    OT Stop Time 1100    OT Time Calculation (min) 45 min    Equipment Utilized During Treatment cane    Activity Tolerance Patient tolerated treatment well    Behavior During Therapy WFL for tasks assessed/performed            Past Medical History:  Diagnosis Date   Cancer (HCC)    skin   Hypothyroidism    Raynaud's disease    Stroke [facility])    Past Surgical History:  Procedure Laterality Date   ABDOMINAL HYSTERECTOMY  1991   APPENDECTOMY  1991   BACK SURGERY  2010   Cervical fusion C4-5-6-7   CATARACT EXTRACTION, BILATERAL Bilateral 10/2016   CHOLECYSTECTOMY  1995   COLONOSCOPY WITH PROPOFOL N/A 12/08/2018   Procedure: COLONOSCOPY WITH PROPOFOL;  Surgeon: Toledo, Boykin Nearing, MD;  Location: [facility] ENDOSCOPY;  Service: Gastroenterology;  Laterality: N/A;   EYE SURGERY     Patient Active Problem List   Diagnosis Date Noted   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  Following inpt  rehab, pt went home and had home health.   PRECAUTIONS: fall  SUBJECTIVE: Pt doing well today and doesn't feel any swelling in her fingers.  PAIN:  Are you having pain? none  OBJECTIVE:  L grip 6, R grip 41 L lateral pinch 5, R 13 L 3 point pinch 4, R 14  L 9 hole 1 min 43 sec. L shoulder active flexion 0-105, passive 0-125 02/05/22: L grip 13#  L lateral pinch 8#  3 point pinch 5#  L shoulder flex 0-108 03/12/22:  L grip: 12# L lateral pinch: 8# 3 point pinch: 6# L 9 hole: 1 min 3 sec L shoulder flexion 0-110 04/16/22: L grip: 3# L lateral pinch: 4# 3 point pinch: difficulty maintaining 3 point pinch  L 9 hole: 5 pegs in 2 min 36 sec  L shoulder flexion 0-107 BP L 132/79 HR 80 02 sats 96% 04/25/22: L grip: 14#; R grip: 50# L lateral 2#, R lateral 17# 3 point pinch: L 4#, R 14# (Saehan pinch gauge) 9 hole peg test: 1 min, 48 sec L shoulder flex 125 (R shoulder 132 active) 05/21/22: L grip: 15#; R grip: 50# L lateral 8#, R lateral 20# 3 point pinch: L 4#, R 12# (Saehan pinch gauge) 9 hole peg test: 1 min, 23 sec 07/04/22: L grip: 19# L lateral 10# 3  point pinch: L 6# (standard pinch gauge-not Saehan) 9 hole peg test: 1 min, 14 sec 07/18/22:  L grip 22# L lateral: 9# L 3 point pinch: 6# L 9 hole: 1 min 8 sec 08/20/22: (Feeling sluggish since 08/16/22 after ED visit for medication adverse reaction) L grip: 21# L lateral: 7# L 3 point pinch: 7# (standard pinch gauge) L 9 hole: 4 pegs in 3 min (OT stopped at 3 min).  08/23/22:  L 9 hole: 1 min 12 sec  TODAY'S TREATMENT: Therapeutic Exercise: Facilitated L forearm, wrist, and hand strengthening with participation in EZ board tools.  Pt worked with long handled tool to facilitate L wrist flex/ext and forearm pron/sup, large base key turn, large dial turn, and small dial to facilitate L hand digit flex/ext x3 reps for each tool (up/down board=1 rep).  Rest breaks between sets and min vc for technique to maximize ROM  with each tool rotation against wide strip of velcro resistance.  Pt required transition to narrow strip of velcro for large dial turn and small dial when working on digit flex/ext, and max vc and tactile cues for technique to perform digit flex/ext correctly with reduced compensation.  Therapeutic Activity: Facilitated [facility] and dexterity skills with the L hand, working to place and remove ball pegs from pegboard.  Pt practiced storage and translatory skills, picking up 1 peg at a time and storing up to 3 in hand in prep to discard pegs 1 at a time from palm.  Progressed to discarding pegs with a supinated forearm to further challenge translatory skills in the L hand.   PATIENT EDUCATION: Education details: L hand FMC/dexterity skills with reduced compensatory movements Person educated: pt Education method: explanation, vc, tactile cues, demo Education comprehension: verbalized understanding, further training needed  HOME EXERCISE PROGRAM Continue to engage LUE into ADLs; continue use of putty for gripping and pinching exercises for LUE, and L shoulder AROM/AAROM, crocheting, typing; increase participation in IADL tasks for greater use of L hand.   OT Short Term Goals - 08/29/22 (6 weeks)      OT SHORT TERM GOAL #1   Title Pt will be independent with home exercise program.    Baseline Eval: no current program, 10th visit:  continue to add new exercises as pt progresses, 20th:  continue to update HEP; 08/17/21: continue to progress HEP when indicated.  Visit 40: adding new exercises as pt progresses; 12/25/21: ongoing with progressions; 03/12/22: inconsistent use of putty but regular use of stress ball (every other day).  Encouraged pt return to daily putty use (2x daily when able) to target grip and pinch strengthening; 04/16/22: not reviewed today d/t OT sent pt to ED; 04/25/22: added pulleys and pt has re-started putty; 05/21/22: stopped pulleys and added stronger theraputty and encouraged pt crochet  often; 07/04/22: pt working consistently with putty and grip strengthener at 20# at home; 07/18/22: pt continues to work on putty and Building surveyor and will be starting with her crocheting again to progress [facility] skills; 08/20/22: Pt continues to use her grip strengthener daily set at 20#; will continue to modify as needed   Time 12    Period Weeks    Status On-going    Target Date 08/29/22     OT LONG TERM GOAL #2   Title Pt will complete UB and LB dressing with modified independence including buttons, snaps and zippers.    Baseline requires min assist at eval, 10th visit: occasional assist with buttons, 20th:  able  to perform one handed, but difficulty with bilateral UE; 08/17/21: pt reports inconsistent with 1 hand, reviewed techniques this visit, Visit 40:  Pt requires assist with bra   Time 12    Period Weeks    Status MET   Target Date 11/08/21     OT LONG TERM GOAL #3   Title Pt will perform shower transfer with modified independence.    Baseline Pt requires supervision to min assist for shower transfer at home. 10th visit: supervision; 08/17/21: supv .  Visit 40:  Pt had met goal but had recent fall and now requiring min guard to supv   Time 6    Period Weeks    Status  Met    Target Date 11/08/21      OT LONG TERM GOAL #4 10/10/22 (12 weeks)    Title Pt will improve L hand grip to 25 or more #s to enable pt to open a new jar.   Baseline no grip in left hand at eval, 10th visit:  improved flexion but still working towards composite fisting and grip. 20th:  continues to demo decreased grip; 08/17/21: active digit flexion improving, but not yet able to register grip on dynamometer; 09/04/21: L grip 1# 7/17:  5#; 12/25/21: L grip 6#; 02/05/22: 13#; 03/12/22: L grip 12#; 1/23//24: L grip 3# (decreased); 04/25/22: L grip 14#; spouse assists to open new jars; 05/21/22: L grip 15#; 07/04/22: L grip 19#; 07/18/22: L grip 22#, difficulty opening a new jar (R grip 50#); 08/20/22: L grip 21# (increased  weakness since Friday after adverse reaction to a new medication resulting in ED visit).   Time 12    Period Weeks    Status Revised/ongoing   Target Date 10/10/22     OT LONG TERM GOAL #5   Title Pt will improve left shoulder flexion to 100 degrees or better to improve reaching to obtain self care items from shelf/shoulder height.    Baseline difficulty with reach, shoulder flexion to 47 degrees; 08/17/21: L shoulder flexion 85, but not yet able to consistently hold ADL supplies in L hand when reaching; 09/04/21: flexion 85 7/17:  shoulder flexion to 90; 12/25/21: 105 (P 125); 03/12/22: active L shoulder flexion 110 (pt uses R arm to reach for items at shoulder height or above); 04/16/22: L shoulder flexion 107; 04/25/22: L shoulder flexion 125*, R 132*    Time 12    Period Weeks    Status achieved   Target Date 04/25/22     OT LONG TERM GOAL #6   Title Pt will improve FOTO score to 47 or above to demonstrate a clinically relevant change in function to impact ADL tasks.    Baseline score of 30 at eval; 08/17/21: FOTO: 42; 09/04/21: FOTO : 54 , FOTO 7/7:  60; 12/25/21: 59; 03/12/22: 66; 04/16/22: 67; 04/25/22; 51; 05/21/22: 74; 07/04/22: 74; 07/18/22: 71; 08/20/22: 77   Time 12    Period Weeks    Status  MET/ongoing    Target Date 10/10/22     OT LONG TERM GOAL #7   Title Pt will demonstrate ability to pick up small objects with L hand and complete 9 hole peg test in less than 1 min (revised)   Baseline unable to perform at eval, 10th visit: still unable to pick up small pegs, 20th: unable to pick up pegs but improving; 08/17/21: not attempted d/t time constraints, will assess next visit; 09/04/21: 9 hole peg test in 5 min 53 sec;  7/17:  Pt unable to complete this date but was able to place 6 of 9 pegs in 4 mins 20 secs; 12/25/21: L 1 min 43 sec; 03/12/22: L 1 min 2 sec (requires non-skid surface to pick up small items from table top); 04/16/22: 5 pegs in 2 min 36 secs; 04/25/22: 1 min 48 sec; 05/21/22: L 1 min 23  sec; 07/04/22: 1 min 14 sec; 08/20/22: Able to complete 4 pegs in 3 min before OT stopped test (declined LUE motor skills since ED visit Friday 08/16/22 d/t adverse reaction to a new med)   Time 12    Period Weeks    Status Revised/On-going    Target Date 10/10/22    OT LONG TERM GOAL #8   Title Pt will increase LUE strength by 1 MM grade in order to hold blow dryer in LUE to dry hair without dropping dryer.   Baseline 04/25/22: Unable to sustain grip and maintain lift of LUE with hand near head without dropping dryer.  Dryer has caused bruising above L eye with previous attempts.  L shoulder flex/abd 3/5, elbow flex/ext 3+/5; 07/04/22: L shoulder flex/abd 3+, elbow and wrist flex/ext 4+   Time 12    Period Weeks    Status New/ ongoing   Target Date 10/10/22        OT LONG TERM GOAL #9   Title Pt will increase L hand dexterity to enable pt to independently manage clothing fasteners.   Baseline 04/25/22: pt ties shoe laces loosely and requires assist from spouse occasionally.  When typing, pt engages the L hand for ~25% of the task.  Pt avoids wearing jeans d/t inability to manage button or zipper; 07/04/22: no change yet from 04/25/22; 07/18/22: Managed zipper today, but increased time and effort to manage buttons on pants; able to tie shoes loosely with increased time; 08/20/23: better efficiency with managing zipper on a jacket; pt manages buttons 1 handed (R); able to tie shoes without laces being loose   Time 12    Period Weeks    Status Ongoing   Target Date 10/10/22        OT LONG TERM GOAL #10   Title Pt will be able to independently carry a light plate of food or drink in L hand without spilling/dropping to increase efficiency with item transport in the kitchen.   Baseline 04/25/22: Pt requires constant cues to keep cup or plate level while multitasking in order to prevent spilling; 07/04/22: Pt reports she is carrying her coffee cup from kitchen to neighboring room (not yet consistent to avoid  spills); 08/20/22: Pt has not been using L hand to carry items much since Friday d/t increased weakness since ED visit.   Time 12    Period Weeks    Status New/ongoing   Target Date 10/10/22     Plan -     Clinical Impression Statement Pt making steady gains with L hand FMC/dexterity skills.  Pt was able to make larger rotations with the EZ board tools with less assist to keep tools level.  Pt was able to consistently store and manipulate up to 3 ball pegs in hand with minimal dropping.  Overall pt is requiring fewer rest breaks during strengthening and coordination activities.  Pt will continue to work towards goals in plan of care to improve left UE functional use in ADL and IADL tasks at home and in the community.    OT Occupational Profile and History Detailed Assessment- Review of  Records and additional review of physical, cognitive, psychosocial history related to current functional performance    Occupational performance deficits (Please refer to evaluation for details): ADL's;IADL's;Leisure;Rest and Sleep    Body Structure / Function / Physical Skills ADL;Coordination;Endurance;GMC;UE functional use;Balance;IADL;Pain;Dexterity;FMC;Strength;Edema;Mobility;ROM    Psychosocial Skills Environmental  Adaptations;Habits;Routines and Behaviors    Rehab Potential Good    Clinical Decision Making Several treatment options, min-mod task modification necessary   Comorbidities Affecting Occupational Performance: May have comorbidities impacting occupational performance    Modification or Assistance to Complete Evaluation  Min-Moderate modification of tasks or assist with assess necessary to complete eval    OT Frequency 2x / week    OT Duration 12 weeks    OT Treatment/Interventions Self-care/ADL training;Cryotherapy;Paraffin;Therapeutic exercise;DME and/or AE instruction;Functional Mobility Training;Balance training;Electrical Stimulation;Ultrasound;Neuromuscular education;Manual  Therapy;Splinting;Moist Heat;Contrast Bath;Passive range of motion;Therapeutic activities;Patient/family education;Coping strategies training    Plan OT recert    Consulted and Agree with Plan of Care Patient           Danelle Earthly, MS, OTR/L  [employer] Dials, OT 09/10/2022, 12:36 PM

## 2022-09-12 ENCOUNTER — Ambulatory Visit: Payer: Medicare HMO

## 2022-09-12 DIAGNOSIS — I63511 Cerebral infarction due to unspecified occlusion or stenosis of right middle cerebral artery: Secondary | ICD-10-CM | POA: Diagnosis not present

## 2022-09-12 DIAGNOSIS — R262 Difficulty in walking, not elsewhere classified: Secondary | ICD-10-CM

## 2022-09-12 DIAGNOSIS — R2681 Unsteadiness on feet: Secondary | ICD-10-CM

## 2022-09-12 DIAGNOSIS — R2689 Other abnormalities of gait and mobility: Secondary | ICD-10-CM | POA: Diagnosis not present

## 2022-09-12 DIAGNOSIS — M6281 Muscle weakness (generalized): Secondary | ICD-10-CM

## 2022-09-12 DIAGNOSIS — R278 Other lack of coordination: Secondary | ICD-10-CM | POA: Diagnosis not present

## 2022-09-12 NOTE — Therapy (Signed)
OUTPATIENT OCCUPATIONAL THERAPY NEURO TREATMENT NOTE        Patient Name: Teresa [name] MRN: [id number] DOB:December 26, 1940, 82 y.o., female Today's Date: 05/23/2022  PCP: Dr. Aram Beecham REFERRING PROVIDER: Dr. Aram Beecham   OT End of Session - 09/12/22 1122     Visit Number 127    Number of Visits 138    Date for OT Re-Evaluation 10/10/22    Authorization Time Period Reporting period beginning 08/20/22    Progress Note Due on Visit 10    OT Start Time 1100    OT Stop Time 1145    OT Time Calculation (min) 45 min    Equipment Utilized During Treatment cane    Activity Tolerance Patient tolerated treatment well    Behavior During Therapy WFL for tasks assessed/performed            Past Medical History:  Diagnosis Date   Cancer (HCC)    skin   Hypothyroidism    Raynaud's disease    Stroke [facility])    Past Surgical History:  Procedure Laterality Date   ABDOMINAL HYSTERECTOMY  1991   APPENDECTOMY  1991   BACK SURGERY  2010   Cervical fusion C4-5-6-7   CATARACT EXTRACTION, BILATERAL Bilateral 10/2016   CHOLECYSTECTOMY  1995   COLONOSCOPY WITH PROPOFOL N/A 12/08/2018   Procedure: COLONOSCOPY WITH PROPOFOL;  Surgeon: Toledo, Boykin Nearing, MD;  Location: [facility] ENDOSCOPY;  Service: Gastroenterology;  Laterality: N/A;   EYE SURGERY     Patient Active Problem List   Diagnosis Date Noted   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  Following inpt  rehab, pt went home and had home health.   PRECAUTIONS: fall  SUBJECTIVE: Pt reports doing well today.  PAIN:  Are you having pain? none  OBJECTIVE:  L grip 6, R grip 41 L lateral pinch 5, R 13 L 3 point pinch 4, R 14  L 9 hole 1 min 43 sec. L shoulder active flexion 0-105, passive 0-125 02/05/22: L grip 13#  L lateral pinch 8#  3 point pinch 5#  L shoulder flex 0-108 03/12/22:  L grip: 12# L lateral pinch: 8# 3 point pinch: 6# L 9 hole: 1 min 3 sec L shoulder flexion 0-110 04/16/22: L grip: 3# L lateral pinch: 4# 3 point pinch: difficulty maintaining 3 point pinch  L 9 hole: 5 pegs in 2 min 36 sec  L shoulder flexion 0-107 BP L 132/79 HR 80 02 sats 96% 04/25/22: L grip: 14#; R grip: 50# L lateral 2#, R lateral 17# 3 point pinch: L 4#, R 14# (Saehan pinch gauge) 9 hole peg test: 1 min, 48 sec L shoulder flex 125 (R shoulder 132 active) 05/21/22: L grip: 15#; R grip: 50# L lateral 8#, R lateral 20# 3 point pinch: L 4#, R 12# (Saehan pinch gauge) 9 hole peg test: 1 min, 23 sec 07/04/22: L grip: 19# L lateral 10# 3 point pinch: L 6# (standard pinch gauge-not  Saehan) 9 hole peg test: 1 min, 14 sec 07/18/22:  L grip 22# L lateral: 9# L 3 point pinch: 6# L 9 hole: 1 min 8 sec 08/20/22: (Feeling sluggish since 08/16/22 after ED visit for medication adverse reaction) L grip: 21# L lateral: 7# L 3 point pinch: 7# (standard pinch gauge) L 9 hole: 4 pegs in 3 min (OT stopped at 3 min).  08/23/22:  L 9 hole: 1 min 12 sec  TODAY'S TREATMENT: Therapeutic Exercise: Facilitated pinch strengthening with use of therapy resistant clothespins to target lateral and 3 point pinch of L hand.  Initial min A. to formulate a lateral pinch, and demo for a 3 point pinch.  Pt managed all colors but black, most resistive pins, clipping on/off a vertical dowel.  Therapeutic Activity: Facilitated [facility] and dexterity skills with the L hand, working to place and remove ball pegs from  pegboard.  Pt practiced storage and translatory skills, picking up 1 peg at a time and storing up to 3 in hand in prep to discard pegs 1 at a time from palm.    PATIENT EDUCATION: Education details: L hand FMC/dexterity skills with reduced compensatory movements Person educated: pt Education method: explanation, vc, tactile cues, demo Education comprehension: verbalized understanding, further training needed  HOME EXERCISE PROGRAM Continue to engage LUE into ADLs; continue use of putty for gripping and pinching exercises for LUE, and L shoulder AROM/AAROM, crocheting, typing; increase participation in IADL tasks for greater use of L hand.   OT Short Term Goals - 08/29/22 (6 weeks)      OT SHORT TERM GOAL #1   Title Pt will be independent with home exercise program.    Baseline Eval: no current program, 10th visit:  continue to add new exercises as pt progresses, 20th:  continue to update HEP; 08/17/21: continue to progress HEP when indicated.  Visit 40: adding new exercises as pt progresses; 12/25/21: ongoing with progressions; 03/12/22: inconsistent use of putty but regular use of stress ball (every other day).  Encouraged pt return to daily putty use (2x daily when able) to target grip and pinch strengthening; 04/16/22: not reviewed today d/t OT sent pt to ED; 04/25/22: added pulleys and pt has re-started putty; 05/21/22: stopped pulleys and added stronger theraputty and encouraged pt crochet often; 07/04/22: pt working consistently with putty and grip strengthener at 20# at home; 07/18/22: pt continues to work on putty and Building surveyor and will be starting with her crocheting again to progress [facility] skills; 08/20/22: Pt continues to use her grip strengthener daily set at 20#; will continue to modify as needed   Time 12    Period Weeks    Status On-going    Target Date 08/29/22     OT LONG TERM GOAL #2   Title Pt will complete UB and LB dressing with modified independence including buttons, snaps and  zippers.    Baseline requires min assist at eval, 10th visit: occasional assist with buttons, 20th:  able to perform one handed, but difficulty with bilateral UE; 08/17/21: pt reports inconsistent with 1 hand, reviewed techniques this visit, Visit 40:  Pt requires assist with bra   Time 12    Period Weeks    Status MET   Target Date 11/08/21     OT LONG TERM GOAL #3   Title Pt will perform shower transfer with modified independence.    Baseline Pt requires supervision to min assist for shower transfer at home. 10th visit: supervision; 08/17/21:  supv .  Visit 40:  Pt had met goal but had recent fall and now requiring min guard to supv   Time 6    Period Weeks    Status  Met    Target Date 11/08/21      OT LONG TERM GOAL #4 10/10/22 (12 weeks)    Title Pt will improve L hand grip to 25 or more #s to enable pt to open a new jar.   Baseline no grip in left hand at eval, 10th visit:  improved flexion but still working towards composite fisting and grip. 20th:  continues to demo decreased grip; 08/17/21: active digit flexion improving, but not yet able to register grip on dynamometer; 09/04/21: L grip 1# 7/17:  5#; 12/25/21: L grip 6#; 02/05/22: 13#; 03/12/22: L grip 12#; 1/23//24: L grip 3# (decreased); 04/25/22: L grip 14#; spouse assists to open new jars; 05/21/22: L grip 15#; 07/04/22: L grip 19#; 07/18/22: L grip 22#, difficulty opening a new jar (R grip 50#); 08/20/22: L grip 21# (increased weakness since Friday after adverse reaction to a new medication resulting in ED visit).   Time 12    Period Weeks    Status Revised/ongoing   Target Date 10/10/22     OT LONG TERM GOAL #5   Title Pt will improve left shoulder flexion to 100 degrees or better to improve reaching to obtain self care items from shelf/shoulder height.    Baseline difficulty with reach, shoulder flexion to 47 degrees; 08/17/21: L shoulder flexion 85, but not yet able to consistently hold ADL supplies in L hand when reaching; 09/04/21:  flexion 85 7/17:  shoulder flexion to 90; 12/25/21: 105 (P 125); 03/12/22: active L shoulder flexion 110 (pt uses R arm to reach for items at shoulder height or above); 04/16/22: L shoulder flexion 107; 04/25/22: L shoulder flexion 125*, R 132*    Time 12    Period Weeks    Status achieved   Target Date 04/25/22     OT LONG TERM GOAL #6   Title Pt will improve FOTO score to 47 or above to demonstrate a clinically relevant change in function to impact ADL tasks.    Baseline score of 30 at eval; 08/17/21: FOTO: 42; 09/04/21: FOTO : 54 , FOTO 7/7:  60; 12/25/21: 59; 03/12/22: 66; 04/16/22: 67; 04/25/22; 51; 05/21/22: 74; 07/04/22: 74; 07/18/22: 71; 08/20/22: 77   Time 12    Period Weeks    Status  MET/ongoing    Target Date 10/10/22     OT LONG TERM GOAL #7   Title Pt will demonstrate ability to pick up small objects with L hand and complete 9 hole peg test in less than 1 min (revised)   Baseline unable to perform at eval, 10th visit: still unable to pick up small pegs, 20th: unable to pick up pegs but improving; 08/17/21: not attempted d/t time constraints, will assess next visit; 09/04/21: 9 hole peg test in 5 min 53 sec;  7/17:  Pt unable to complete this date but was able to place 6 of 9 pegs in 4 mins 20 secs; 12/25/21: L 1 min 43 sec; 03/12/22: L 1 min 2 sec (requires non-skid surface to pick up small items from table top); 04/16/22: 5 pegs in 2 min 36 secs; 04/25/22: 1 min 48 sec; 05/21/22: L 1 min 23 sec; 07/04/22: 1 min 14 sec; 08/20/22: Able to complete 4 pegs in 3 min before OT stopped test (declined LUE motor  skills since ED visit Friday 08/16/22 d/t adverse reaction to a new med)   Time 12    Period Weeks    Status Revised/On-going    Target Date 10/10/22    OT LONG TERM GOAL #8   Title Pt will increase LUE strength by 1 MM grade in order to hold blow dryer in LUE to dry hair without dropping dryer.   Baseline 04/25/22: Unable to sustain grip and maintain lift of LUE with hand near head without dropping dryer.   Dryer has caused bruising above L eye with previous attempts.  L shoulder flex/abd 3/5, elbow flex/ext 3+/5; 07/04/22: L shoulder flex/abd 3+, elbow and wrist flex/ext 4+   Time 12    Period Weeks    Status New/ ongoing   Target Date 10/10/22        OT LONG TERM GOAL #9   Title Pt will increase L hand dexterity to enable pt to independently manage clothing fasteners.   Baseline 04/25/22: pt ties shoe laces loosely and requires assist from spouse occasionally.  When typing, pt engages the L hand for ~25% of the task.  Pt avoids wearing jeans d/t inability to manage button or zipper; 07/04/22: no change yet from 04/25/22; 07/18/22: Managed zipper today, but increased time and effort to manage buttons on pants; able to tie shoes loosely with increased time; 08/20/23: better efficiency with managing zipper on a jacket; pt manages buttons 1 handed (R); able to tie shoes without laces being loose   Time 12    Period Weeks    Status Ongoing   Target Date 10/10/22        OT LONG TERM GOAL #10   Title Pt will be able to independently carry a light plate of food or drink in L hand without spilling/dropping to increase efficiency with item transport in the kitchen.   Baseline 04/25/22: Pt requires constant cues to keep cup or plate level while multitasking in order to prevent spilling; 07/04/22: Pt reports she is carrying her coffee cup from kitchen to neighboring room (not yet consistent to avoid spills); 08/20/22: Pt has not been using L hand to carry items much since Friday d/t increased weakness since ED visit.   Time 12    Period Weeks    Status New/ongoing   Target Date 10/10/22     Plan -     Clinical Impression Statement Pt making steady gains with L hand FMC/dexterity skills.  Overall pt is requiring fewer rest breaks during strengthening and coordination activities.  Pt will continue to work towards goals in plan of care to improve left UE functional use in ADL and IADL tasks at home and in the  community.    OT Occupational Profile and History Detailed Assessment- Review of Records and additional review of physical, cognitive, psychosocial history related to current functional performance    Occupational performance deficits (Please refer to evaluation for details): ADL's;IADL's;Leisure;Rest and Sleep    Body Structure / Function / Physical Skills ADL;Coordination;Endurance;GMC;UE functional use;Balance;IADL;Pain;Dexterity;FMC;Strength;Edema;Mobility;ROM    Psychosocial Skills Environmental  Adaptations;Habits;Routines and Behaviors    Rehab Potential Good    Clinical Decision Making Several treatment options, min-mod task modification necessary   Comorbidities Affecting Occupational Performance: May have comorbidities impacting occupational performance    Modification or Assistance to Complete Evaluation  Min-Moderate modification of tasks or assist with assess necessary to complete eval    OT Frequency 2x / week    OT Duration 12 weeks    OT  Treatment/Interventions Self-care/ADL training;Cryotherapy;Paraffin;Therapeutic exercise;DME and/or AE instruction;Functional Mobility Training;Balance training;Electrical Stimulation;Ultrasound;Neuromuscular education;Manual Therapy;Splinting;Moist Heat;Contrast Bath;Passive range of motion;Therapeutic activities;Patient/family education;Coping strategies training    Plan OT recert    Consulted and Agree with Plan of Care Patient           Danelle Earthly, MS, OTR/L  [employer] Dials, OT 09/12/2022, 11:32 AM

## 2022-09-12 NOTE — Therapy (Signed)
OUTPATIENT PHYSICAL THERAPY NEURO TREATMENT   Patient Name: Teresa [name] MRN: [id number] DOB:1940-04-19, 82 y.o., female Today's Date: 09/12/2022   PCP: Dr. Aram Beecham REFERRING PROVIDER: Dr. Aram Beecham  END OF SESSION:  PT End of Session - 09/12/22 2056     Visit Number 26    Number of Visits 41    Date for PT Re-Evaluation 11/05/22    Authorization Type [employer] PPO-    Authorization Time Period 05/23/2022- 08/15/2022    Progress Note Due on Visit 10    PT Start Time 1146    PT Stop Time 1227    PT Time Calculation (min) 41 min    Equipment Utilized During Treatment Gait belt    Activity Tolerance Patient tolerated treatment well    Behavior During Therapy WFL for tasks assessed/performed                            Past Medical History:  Diagnosis Date   Cancer (HCC)    skin   Hypothyroidism    Raynaud's disease    Stroke [facility])    Past Surgical History:  Procedure Laterality Date   ABDOMINAL HYSTERECTOMY  1991   APPENDECTOMY  1991   BACK SURGERY  2010   Cervical fusion C4-5-6-7   CATARACT EXTRACTION, BILATERAL Bilateral 10/2016   CHOLECYSTECTOMY  1995   COLONOSCOPY WITH PROPOFOL N/A 12/08/2018   Procedure: COLONOSCOPY WITH PROPOFOL;  Surgeon: Toledo, Boykin Nearing, MD;  Location: [facility] ENDOSCOPY;  Service: Gastroenterology;  Laterality: N/A;   EYE SURGERY     Patient Active Problem List   Diagnosis Date Noted   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   Stroke (HCC) 02/27/2021   History of nonmelanoma skin cancer 05/23/2014    ONSET DATE: 04/16/2022  REFERRING DIAG: 69.354 (ICD-10-CM) - Hemiplegia and hemiparesis following cerebral infarction affecting left non-dominant side   THERAPY DIAG:  Unsteadiness on feet  Difficulty in walking, not elsewhere classified  Rationale for Evaluation and Treatment: Rehabilitation  SUBJECTIVE:                                                                                                                                                                                              SUBJECTIVE STATEMENT: Pt reports no falls, no pain.  Pt bruise on L forearm healing.   Pt accompanied by: self  PERTINENT HISTORY: Pt is a 82 y.o. female with referral for hemiplegia with left sided weakness -original CVA on 02/26/21 and new onset of left sided weakness on 04/16/2022.  R MCA CVA on  02/26/21. Pt completed outpatient PT on 01/17/2022 with majority of goals met. PMH includes: skin cancer, hypothyroidism, and Raynaud's disease, history of cervical fusion in 2010 C4-C7.   PAIN:  Are you having pain?  no  PRECAUTIONS: Fall  WEIGHT BEARING RESTRICTIONS: No  FALLS: Has patient fallen in last 6 months? No  LIVING ENVIRONMENT: Lives with: lives with their spouse Lives in: House/apartment Stairs: Yes: External: 1 steps; none Has following equipment at home: Single point cane, Walker - 2 wheeled, shower chair, and Grab bars  PLOF: Independent  PATIENT GOALS: I want to walk without this cane and no falls and enjoy going out to grandkids sporting event  OBJECTIVE:   DIAGNOSTIC FINDINGS:   5/24:  EXAM: CT HEAD WITHOUT CONTRAST  IMPRESSION: 1. No evidence of acute intracranial abnormality. 2. Remote right MCA territory infarct.     CLINICAL DATA:  Stroke suspected   EXAM: MRI HEAD WITHOUT CONTRAST   TECHNIQUE: Multiplanar, multiecho pulse sequences of the brain and surrounding structures were obtained without intravenous contrast.   COMPARISON:  MRI Head 02/26/21, CT Head 04/16/22   FINDINGS: Brain: Evolving right MCA territory infarct involving the right frontal operculum in the right insular cortex. No acute infarction, hemorrhage, hydrocephalus, extra-axial collection or mass lesion. Small focus of microhemorrhage in the left temporal lobe. There is petechial hemorrhage and/or hemosiderosis in the region of prior right MCA territory infarct. T2/stir  hyperintense signal in the right cerebral peduncle is favored to represent transneuronal degeneration.   Vascular: Major flow voids are preserved.   Skull and upper cervical spine: Normal marrow signal.   Sinuses/Orbits: Bilateral lens replacement. No mastoid or middle ear effusion. Paranasal sinuses are clear.   Other: None.   IMPRESSION: 1. No acute intracranial abnormality. 2. 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 3.     Electronically Signed   By: Lorenza Cambridge M.D.   On: 04/16/2022 13:41  COGNITION: Overall cognitive status: Within functional limits for tasks assessed   SENSATION: WFL  COORDINATION: Intact with each LE  EDEMA:  None observed  MUSCLE TONE: LLE: Within functional limits  DTRs:  Patella 2+ = Normal and Achilles 2+ = Normal  POSTURE: rounded shoulders and forward head  LOWER EXTREMITY ROM:     Active  Right Eval Left Eval  Hip flexion WNL THROUGHOUT LE WNL THROUGHOUT LE  Hip extension    Hip abduction    Hip adduction    Hip internal rotation    Hip external rotation    Knee flexion    Knee extension    Ankle dorsiflexion    Ankle plantarflexion    Ankle inversion    Ankle eversion     (Blank rows = not tested)  LOWER EXTREMITY MMT:    MMT Right Eval Left Eval  Hip flexion 4+ 4  Hip extension 4+ 4  Hip abduction 4+ 4  Hip adduction 4+ 4  Hip internal rotation 4+ 4  Hip external rotation 4+ 4  Knee flexion 4+ 4  Knee extension 5 4  Ankle dorsiflexion 5 4  Ankle plantarflexion    Ankle inversion    Ankle eversion    (Blank rows = not tested)  BED MOBILITY:  Patient reports independent with all bed mobility and states has returned to master bed  TRANSFERS: Assistive device utilized: None  Sit to stand: Complete Independence Stand to sit: Complete Independence Chair to chair: Complete Independence Floor:  Not tested  GAIT: Gait pattern: step through  pattern, decreased arm swing- Right, decreased arm swing- Left, decreased step length- Right, and decreased step length- Left Distance walked: 915 feet Assistive device utilized: None Level of assistance: SBA Comments: occasional scuff or decreased left foot clearance  FUNCTIONAL TESTS:  5 times sit to stand: 23.62 sec without UE support Timed up and go (TUG): 20.14 sec without an AD 10 meter walk test: 0.70 m/s  PATIENT SURVEYS:  FOTO 60/ goal of 66  TODAY'S TREATMENT:                                                                                                                              DATE: 09/12/22 Gait belt donned and CGA provided throughout unless specified otherwise   TE:   Treadmill endurance training with additional focus on heel-strike: pt ambulates up to 1.4 mph for total of  5 min. CGA throughout and cuing for technique. Close CGA for mount/dismount (pt observed difficulty with foot clearance).  Seated figure 4 - 2x30 sec hold - pt reports feeling more of a stretch on the R Sit to stand with right LE compromised (foot on top of airex pad) to emphasize Left LE power- 2x 10 reps   NMR  Agility ladder for heel-toe sequencing x multiple reps  --Pt then completes same intervention with 2# AW donned each LE x multiple reps. PT provides up to min a to correct for LOB.   2# AW each LE- Standing slow march for SLB progression in // bars Static high knee march 20x alt LE - decreased eccentric control throughout. Pt has difficulty correcting with cuing Step-tap onto 6" step with decreasing levels of UE support while wearing 2# AW 2x20   SLB 2x30 sec each LE  Gait in long hallway focusing on heel-toe sequencing 2x80 ft Gait in long hallway reading sticky notes for dual task, incorporating head turns, and focus on heel-toe sequencing 2x80 ft --pt then performed additional rep with cuing for L arm swing  Comments: mild instability throughout, requires close  CGA     PATIENT EDUCATION: Education details: Pt educated throughout session about proper posture and technique with exercises. Improved exercise technique, movement at target joints, use of target muscles after min to mod verbal, visual, tactile cues.  Person educated: Patient Education method: Explanation, Demonstration, Tactile cues, and Verbal cues Education comprehension: verbalized understanding, returned demonstration, verbal cues required, tactile cues required, and needs further education  HOME EXERCISE PROGRAM: To be updated next 1-2 visit  GOALS: Goals reviewed with patient? Yes  SHORT TERM GOALS: Target date: 07/04/2022  Patient will be independent in home exercise program to improve strength/mobility for better functional independence with ADLs  Baseline: EVAL: Patient reports mostly just walking; 07/09/22: Pt reports her exercises are going well, to be updated future date Goal status: IN PROGRESS  LONG TERM GOALS: Target date: 11/05/2022    Pt will decrease 5TSTS by at least 6 seconds  in order to demonstrate clinically significant improvement in LE strength. Baseline: EVAL= 23.62 sec without UE; 07/09/22:  13 seconds hands-free  Goal status: MET  2. Pt will improve FOTO to target score of 66 to display perceived improvements in ability to complete ADL's.  Baseline: Eval= 60; 07/09/22: 65 5/21: 59% Goal status: PARTIALLY MET  3.  Pt will decrease TUG to below 14 seconds/decrease in order to demonstrate decreased fall risk. Baseline: Eval= 20.14 sec without AD; 07/09/22: 11 seconds no AD Goal status: MET  4.  Patient will increase six minute walk test distance to >1200 ft for improve gait ability and return to PLOF to complete her walks in her neighborhood.  Baseline: EVAL= 915 feet without an AD; 07/09/22: 1184 ft without AD 5/21: 1060 ft  Goal status: Partially MET  5. Pt will increase by at least 0.13 m/s in order to demonstrate clinically significant  improvement in community ambulation.    Baseline: Eval = 0.70 m/s; 07/09/2022: 1.04 m/s without AD   Goal status: MET   6. Pt will report that she has not hit LLE/LUE on doorframe/walls/furniture while ambulating in the past two weeks to indicate improved ability to scan environment and to decrease injury risk with gait.  Baseline: currently has hit furniture/doorframe 2x/week 5/21: hitting doorframe less frequently. 08/23/2022- Patient reports no bumping arms into any walls or furniture since last tested on 08/13/2022.   Goal status: Ongoing  7. Patient will increase Functional Gait Assessment score to >20/30 as to reduce fall risk and improve dynamic gait safety with community ambulation.  Baseline:5/21: 13/30  Goal status: NEW   ASSESSMENT:  CLINICAL IMPRESSION: Pt with excellent motivation to participate in session and is able to complete multiple, high level dynamic balance activities. Pt still with intermittent difficulty with heel-toe sequencing and is generally challenged by SLB interventions. The pt will benefit from skilled PT services to improve her overall left LE muscle strength and improve her mobility to decrease her fall risk and improve her overall quality of life.   OBJECTIVE IMPAIRMENTS: Abnormal gait, decreased activity tolerance, decreased balance, decreased coordination, decreased endurance, decreased mobility, difficulty walking, and decreased strength.   ACTIVITY LIMITATIONS: carrying, lifting, bending, standing, squatting, stairs, and transfers  PARTICIPATION LIMITATIONS: cleaning, laundry, driving, shopping, community activity, and yard work  PERSONAL FACTORS: 3+ comorbidities: OA, HTN, hx of previous CVA  are also affecting patient's functional outcome.   REHAB POTENTIAL: Good  CLINICAL DECISION MAKING: Evolving/moderate complexity  EVALUATION COMPLEXITY: Moderate  PLAN:  PT FREQUENCY: 1-2x/week  PT DURATION: 12 weeks  PLANNED INTERVENTIONS: Therapeutic  exercises, Therapeutic activity, Neuromuscular re-education, Balance training, Gait training, Patient/Family education, Self Care, Joint mobilization, Joint manipulation, Stair training, Vestibular training, Canalith repositioning, DME instructions, Dry Needling, Electrical stimulation, Spinal manipulation, Spinal mobilization, Cryotherapy, Moist heat, Taping, and Manual therapy  PLAN FOR NEXT SESSION:   Progress gait training to include dual-cog tasks, BLE endurance & dynamic balance.  Monitor BP and continue to assess for worsening s/s of stroke. Continue plan   Baird Kay PT  [facility]  09/12/2022, 8:57 PM   Physical Therapist - [facility], [facility]  Outpatient Physical Therapy- Main Campus [phone]

## 2022-09-17 ENCOUNTER — Ambulatory Visit: Payer: Medicare HMO

## 2022-09-17 DIAGNOSIS — I63511 Cerebral infarction due to unspecified occlusion or stenosis of right middle cerebral artery: Secondary | ICD-10-CM

## 2022-09-17 DIAGNOSIS — R2689 Other abnormalities of gait and mobility: Secondary | ICD-10-CM

## 2022-09-17 DIAGNOSIS — R278 Other lack of coordination: Secondary | ICD-10-CM | POA: Diagnosis not present

## 2022-09-17 DIAGNOSIS — R2681 Unsteadiness on feet: Secondary | ICD-10-CM

## 2022-09-17 DIAGNOSIS — R262 Difficulty in walking, not elsewhere classified: Secondary | ICD-10-CM

## 2022-09-17 DIAGNOSIS — M6281 Muscle weakness (generalized): Secondary | ICD-10-CM | POA: Diagnosis not present

## 2022-09-17 NOTE — Therapy (Signed)
OUTPATIENT PHYSICAL THERAPY NEURO TREATMENT   Patient Name: Teresa [name] MRN: [id number] DOB:05/19/1940, 82 y.o., female Today's Date: 09/18/2022   PCP: Dr. Aram Beecham REFERRING PROVIDER: Dr. Aram Beecham  END OF SESSION:  PT End of Session - 09/17/22 1019     Visit Number 27    Number of Visits 41    Date for PT Re-Evaluation 11/05/22    Authorization Type [employer] PPO-    Authorization Time Period 05/23/2022- 08/15/2022    Progress Note Due on Visit 10    PT Start Time 1015    PT Stop Time 1058    PT Time Calculation (min) 43 min    Equipment Utilized During Treatment Gait belt    Activity Tolerance Patient tolerated treatment well    Behavior During Therapy WFL for tasks assessed/performed                            Past Medical History:  Diagnosis Date   Cancer (HCC)    skin   Hypothyroidism    Raynaud's disease    Stroke [facility])    Past Surgical History:  Procedure Laterality Date   ABDOMINAL HYSTERECTOMY  1991   APPENDECTOMY  1991   BACK SURGERY  2010   Cervical fusion C4-5-6-7   CATARACT EXTRACTION, BILATERAL Bilateral 10/2016   CHOLECYSTECTOMY  1995   COLONOSCOPY WITH PROPOFOL N/A 12/08/2018   Procedure: COLONOSCOPY WITH PROPOFOL;  Surgeon: Toledo, Boykin Nearing, MD;  Location: [facility] ENDOSCOPY;  Service: Gastroenterology;  Laterality: N/A;   EYE SURGERY     Patient Active Problem List   Diagnosis Date Noted   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   Stroke (HCC) 02/27/2021   History of nonmelanoma skin cancer 05/23/2014    ONSET DATE: 04/16/2022  REFERRING DIAG: 69.354 (ICD-10-CM) - Hemiplegia and hemiparesis following cerebral infarction affecting left non-dominant side   THERAPY DIAG:  Unsteadiness on feet  Difficulty in walking, not elsewhere classified  Other lack of coordination  Right middle cerebral artery stroke (HCC)  Other abnormalities of gait and mobility  Rationale for Evaluation and  Treatment: Rehabilitation  SUBJECTIVE:                                                                                                                                                                                             SUBJECTIVE STATEMENT: Pt reports doing well - too hot to walk at home outdoors.     Pt accompanied by: self  PERTINENT HISTORY: Pt is a 82 y.o. female with referral for hemiplegia with left  sided weakness -original CVA on 02/26/21 and new onset of left sided weakness on 04/16/2022.  R MCA CVA on 02/26/21. Pt completed outpatient PT on 01/17/2022 with majority of goals met. PMH includes: skin cancer, hypothyroidism, and Raynaud's disease, history of cervical fusion in 2010 C4-C7.   PAIN:  Are you having pain?  no  PRECAUTIONS: Fall  WEIGHT BEARING RESTRICTIONS: No  FALLS: Has patient fallen in last 6 months? No  LIVING ENVIRONMENT: Lives with: lives with their spouse Lives in: House/apartment Stairs: Yes: External: 1 steps; none Has following equipment at home: Single point cane, Walker - 2 wheeled, shower chair, and Grab bars  PLOF: Independent  PATIENT GOALS: I want to walk without this cane and no falls and enjoy going out to grandkids sporting event  OBJECTIVE:   DIAGNOSTIC FINDINGS:   5/24:  EXAM: CT HEAD WITHOUT CONTRAST  IMPRESSION: 1. No evidence of acute intracranial abnormality. 2. Remote right MCA territory infarct.     CLINICAL DATA:  Stroke suspected   EXAM: MRI HEAD WITHOUT CONTRAST   TECHNIQUE: Multiplanar, multiecho pulse sequences of the brain and surrounding structures were obtained without intravenous contrast.   COMPARISON:  MRI Head 02/26/21, CT Head 04/16/22   FINDINGS: Brain: Evolving right MCA territory infarct involving the right frontal operculum in the right insular cortex. No acute infarction, hemorrhage, hydrocephalus, extra-axial collection or mass lesion. Small focus of microhemorrhage in the left temporal  lobe. There is petechial hemorrhage and/or hemosiderosis in the region of prior right MCA territory infarct. T2/stir hyperintense signal in the right cerebral peduncle is favored to represent transneuronal degeneration.   Vascular: Major flow voids are preserved.   Skull and upper cervical spine: Normal marrow signal.   Sinuses/Orbits: Bilateral lens replacement. No mastoid or middle ear effusion. Paranasal sinuses are clear.   Other: None.   IMPRESSION: 1. No acute intracranial abnormality. 2. 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 3.     Electronically Signed   By: Lorenza Cambridge M.D.   On: 04/16/2022 13:41  COGNITION: Overall cognitive status: Within functional limits for tasks assessed   SENSATION: WFL  COORDINATION: Intact with each LE  EDEMA:  None observed  MUSCLE TONE: LLE: Within functional limits  DTRs:  Patella 2+ = Normal and Achilles 2+ = Normal  POSTURE: rounded shoulders and forward head  LOWER EXTREMITY ROM:     Active  Right Eval Left Eval  Hip flexion WNL THROUGHOUT LE WNL THROUGHOUT LE  Hip extension    Hip abduction    Hip adduction    Hip internal rotation    Hip external rotation    Knee flexion    Knee extension    Ankle dorsiflexion    Ankle plantarflexion    Ankle inversion    Ankle eversion     (Blank rows = not tested)  LOWER EXTREMITY MMT:    MMT Right Eval Left Eval  Hip flexion 4+ 4  Hip extension 4+ 4  Hip abduction 4+ 4  Hip adduction 4+ 4  Hip internal rotation 4+ 4  Hip external rotation 4+ 4  Knee flexion 4+ 4  Knee extension 5 4  Ankle dorsiflexion 5 4  Ankle plantarflexion    Ankle inversion    Ankle eversion    (Blank rows = not tested)  BED MOBILITY:  Patient reports independent with all bed mobility and states has returned to master bed  TRANSFERS: Assistive device utilized: None  Sit to stand: Complete Independence Stand to sit:  Complete Independence Chair to chair: Complete Independence Floor:  Not tested   GAIT: Gait pattern: step through pattern, decreased arm swing- Right, decreased arm swing- Left, decreased step length- Right, and decreased step length- Left Distance walked: 915 feet Assistive device utilized: None Level of assistance: SBA Comments: occasional scuff or decreased left foot clearance  FUNCTIONAL TESTS:  5 times sit to stand: 23.62 sec without UE support Timed up and go (TUG): 20.14 sec without an AD 10 meter walk test: 0.70 m/s  PATIENT SURVEYS:  FOTO 60/ goal of 66  TODAY'S TREATMENT:                                                                                                                              DATE: 09/17/2022 Gait belt donned and CGA provided throughout unless specified otherwise      NMR  Dynamic High knee march with 3# AW and No UE support -Difficulty with VC to slow down for more focus on SLS balance x 20 reps  Step-tap onto 6" step with decreasing levels of UE support while wearing 3# AW 2x20   Dynamic walking in hallway with horizontal head turns- calling out pictures/item on sticky notes- scanning left to right with 100% accuracy x 80 feet x 2 with 3# AW and no LOB.   SLB 2x30 sec each LE (multiple attempts intially standing on left but able to perform x 30sec x 2)    Forward step over 1/2 foam with 3# AW 2 sets of 15 reps each LE- Mild instability but improved with 2nd set.   Positioned 2 (1/2 foams lined one in front of other to form 1 long foam) and patient then ambulated with 3# AW diagonally forward x 5 trials. Mild difficulty with coordination  Therex:   Sit to stand with Overhead reach (holding onto 1and 1/4 in PVC) x 15 reps x 2 sets    PATIENT EDUCATION: Education details: Pt educated throughout session about proper posture and technique with exercises. Improved exercise technique, movement at target joints, use of target muscles after min to  mod verbal, visual, tactile cues.  Person educated: Patient Education method: Explanation, Demonstration, Tactile cues, and Verbal cues Education comprehension: verbalized understanding, returned demonstration, verbal cues required, tactile cues required, and needs further education  HOME EXERCISE PROGRAM: To be updated next 1-2 visit  GOALS: Goals reviewed with patient? Yes  SHORT TERM GOALS: Target date: 07/04/2022  Patient will be independent in home exercise program to improve strength/mobility for better functional independence with ADLs  Baseline: EVAL: Patient reports mostly just walking; 07/09/22: Pt reports her exercises are going well, to be updated future date Goal status: IN PROGRESS  LONG TERM GOALS: Target date: 11/05/2022    Pt will decrease 5TSTS by at least 6 seconds in order to demonstrate clinically significant improvement in LE strength. Baseline: EVAL= 23.62 sec without UE; 07/09/22:  13  seconds hands-free  Goal status: MET  2. Pt will improve FOTO to target score of 66 to display perceived improvements in ability to complete ADL's.  Baseline: Eval= 60; 07/09/22: 65 5/21: 59% Goal status: PARTIALLY MET  3.  Pt will decrease TUG to below 14 seconds/decrease in order to demonstrate decreased fall risk. Baseline: Eval= 20.14 sec without AD; 07/09/22: 11 seconds no AD Goal status: MET  4.  Patient will increase six minute walk test distance to >1200 ft for improve gait ability and return to PLOF to complete her walks in her neighborhood.  Baseline: EVAL= 915 feet without an AD; 07/09/22: 1184 ft without AD 5/21: 1060 ft  Goal status: Partially MET  5. Pt will increase by at least 0.13 m/s in order to demonstrate clinically significant improvement in community ambulation.    Baseline: Eval = 0.70 m/s; 07/09/2022: 1.04 m/s without AD   Goal status: MET   6. Pt will report that she has not hit LLE/LUE on doorframe/walls/furniture while ambulating in the past two  weeks to indicate improved ability to scan environment and to decrease injury risk with gait.  Baseline: currently has hit furniture/doorframe 2x/week 5/21: hitting doorframe less frequently. 08/23/2022- Patient reports no bumping arms into any walls or furniture since last tested on 08/13/2022.   Goal status: Ongoing  7. Patient will increase Functional Gait Assessment score to >20/30 as to reduce fall risk and improve dynamic gait safety with community ambulation.  Baseline:5/21: 13/30  Goal status: NEW   ASSESSMENT:  CLINICAL IMPRESSION: Pt demonstrated improving SLS with effort today. She was challenged with coordination activities including diagonal stepping and forward/retro steps.  She performed well with dynamic walking including head movements with no LOB. The pt will benefit from skilled PT services to improve her overall left LE muscle strength and improve her mobility to decrease her fall risk and improve her overall quality of life.   OBJECTIVE IMPAIRMENTS: Abnormal gait, decreased activity tolerance, decreased balance, decreased coordination, decreased endurance, decreased mobility, difficulty walking, and decreased strength.   ACTIVITY LIMITATIONS: carrying, lifting, bending, standing, squatting, stairs, and transfers  PARTICIPATION LIMITATIONS: cleaning, laundry, driving, shopping, community activity, and yard work  PERSONAL FACTORS: 3+ comorbidities: OA, HTN, hx of previous CVA  are also affecting patient's functional outcome.   REHAB POTENTIAL: Good  CLINICAL DECISION MAKING: Evolving/moderate complexity  EVALUATION COMPLEXITY: Moderate  PLAN:  PT FREQUENCY: 1-2x/week  PT DURATION: 12 weeks  PLANNED INTERVENTIONS: Therapeutic exercises, Therapeutic activity, Neuromuscular re-education, Balance training, Gait training, Patient/Family education, Self Care, Joint mobilization, Joint manipulation, Stair training, Vestibular training, Canalith repositioning, DME  instructions, Dry Needling, Electrical stimulation, Spinal manipulation, Spinal mobilization, Cryotherapy, Moist heat, Taping, and Manual therapy  PLAN FOR NEXT SESSION:   Progress gait training to include dual-cog tasks, BLE endurance & dynamic balance.  Monitor BP and continue to assess for worsening s/s of stroke. Continue plan   Lenda Kelp PT  [facility]  09/18/2022, 11:29 AM   Physical Therapist - [facility]  Outpatient Physical Therapy- Main Campus [phone]

## 2022-09-17 NOTE — Therapy (Signed)
OUTPATIENT OCCUPATIONAL THERAPY NEURO TREATMENT NOTE        Patient Name: Teresa [name] MRN: [id number] DOB:[birth year], 82 y.o., female Today's Date: 05/23/2022  PCP: Dr. Aram Beecham REFERRING PROVIDER: Dr. Aram Beecham   OT End of Session - 09/17/22 1106     Visit Number [id number]    Number of Visits 138    Date for OT Re-Evaluation 10/10/22    Authorization Time Period Reporting period beginning 08/20/22    Progress Note Due on Visit 10    OT Start Time 1100    OT Stop Time 1145    OT Time Calculation (min) 45 min    Equipment Utilized During Treatment cane    Activity Tolerance Patient tolerated treatment well    Behavior During Therapy WFL for tasks assessed/performed            Past Medical History:  Diagnosis Date   Cancer (HCC)    skin   Hypothyroidism    Raynaud's disease    Stroke [facility])    Past Surgical History:  Procedure Laterality Date   ABDOMINAL HYSTERECTOMY  1991   APPENDECTOMY  1991   BACK SURGERY  2010   Cervical fusion C4-5-6-7   CATARACT EXTRACTION, BILATERAL Bilateral 10/2016   CHOLECYSTECTOMY  1995   COLONOSCOPY WITH PROPOFOL N/A 12/08/2018   Procedure: COLONOSCOPY WITH PROPOFOL;  Surgeon: Toledo, Boykin Nearing, MD;  Location: [facility] ENDOSCOPY;  Service: Gastroenterology;  Laterality: N/A;   EYE SURGERY     Patient Active Problem List   Diagnosis Date Noted   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   Stroke (HCC) 02/27/2021   History of nonmelanoma skin cancer 05/23/2014   ONSET DATE: 02/26/2021  REFERRING DIAG: R MCA CVA  THERAPY DIAG:  Muscle weakness (generalized)  Other lack of coordination  Right middle cerebral artery stroke [facility])  Rationale for Evaluation and Treatment Rehabilitation  PERTINENT HISTORY: February 26, 2021, pt reports she had a CVA, came to [facility] to ER and then was transferred to [facility] in [place] where she was admitted and after acute care she went to inpatient rehab.  Following inpt  rehab, pt went home and had home health.   PRECAUTIONS: fall  SUBJECTIVE: Pt reports she did really well at PT today and has been cutting all her own food lately.   PAIN:  Are you having pain? none  OBJECTIVE:  L grip 6, R grip 41 L lateral pinch 5, R 13 L 3 point pinch 4, R 14  L 9 hole 1 min 43 sec. L shoulder active flexion 0-105, passive 0-125 02/05/22: L grip 13#  L lateral pinch 8#  3 point pinch 5#  L shoulder flex 0-108 03/12/22:  L grip: 12# L lateral pinch: 8# 3 point pinch: 6# L 9 hole: 1 min 3 sec L shoulder flexion 0-110 04/16/22: L grip: 3# L lateral pinch: 4# 3 point pinch: difficulty maintaining 3 point pinch  L 9 hole: 5 pegs in 2 min 36 sec  L shoulder flexion 0-107 BP L 132/79 HR 80 02 sats 96% 04/25/22: L grip: 14#; R grip: 50# L lateral 2#, R lateral 17# 3 point pinch: L 4#, R 14# (Saehan pinch gauge) 9 hole peg test: 1 min, 48 sec L shoulder flex 125 (R shoulder 132 active) 05/21/22: L grip: 15#; R grip: 50# L lateral 8#, R lateral 20# 3 point pinch: L 4#, R 12# (Saehan pinch gauge) 9 hole peg test: 1 min, 23 sec 07/04/22:  L grip: 19# L lateral 10# 3 point pinch: L 6# (standard pinch gauge-not Saehan) 9 hole peg test: 1 min, 14 sec 07/18/22:  L grip 22# L lateral: 9# L 3 point pinch: 6# L 9 hole: 1 min 8 sec 08/20/22: (Feeling sluggish since 08/16/22 after ED visit for medication adverse reaction) L grip: 21# L lateral: 7# L 3 point pinch: 7# (standard pinch gauge) L 9 hole: 4 pegs in 3 min (OT stopped at 3 min).  08/23/22:  L 9 hole: 1 min 12 sec  TODAY'S TREATMENT: Therapeutic Exercise: Facilitated pinch strengthening with use of therapy resistant clothespins to target lateral and 3 point pinch of L hand.  Pt managed all colors including black, most resistive pins, clipping on/off a vertical dowel. Pt completed 20# grip strengthener for 1 set x 20 reps.   Therapeutic Activity: Facilitated [facility] and dexterity skills with the L hand,  working to place and remove 1/4" ball pegs from pegboard.  Pt practiced storage skills while removing pegs, picking up 1 peg at a time and storing up to 9 in hand.   PATIENT EDUCATION: Education details: L hand FMC/dexterity skills with reduced compensatory movements Person educated: pt Education method: explanation, vc, tactile cues, demo Education comprehension: verbalized understanding, further training needed  HOME EXERCISE PROGRAM Continue to engage LUE into ADLs; continue use of putty for gripping and pinching exercises for LUE, and L shoulder AROM/AAROM, crocheting, typing; increase participation in IADL tasks for greater use of L hand.   OT Short Term Goals - 08/29/22 (6 weeks)      OT SHORT TERM GOAL #1   Title Pt will be independent with home exercise program.    Baseline Eval: no current program, 10th visit:  continue to add new exercises as pt progresses, 20th:  continue to update HEP; 08/17/21: continue to progress HEP when indicated.  Visit 40: adding new exercises as pt progresses; 12/25/21: ongoing with progressions; 03/12/22: inconsistent use of putty but regular use of stress ball (every other day).  Encouraged pt return to daily putty use (2x daily when able) to target grip and pinch strengthening; 04/16/22: not reviewed today d/t OT sent pt to ED; 04/25/22: added pulleys and pt has re-started putty; 05/21/22: stopped pulleys and added stronger theraputty and encouraged pt crochet often; 07/04/22: pt working consistently with putty and grip strengthener at 20# at home; 07/18/22: pt continues to work on putty and Building surveyor and will be starting with her crocheting again to progress [facility] skills; 08/20/22: Pt continues to use her grip strengthener daily set at 20#; will continue to modify as needed   Time 12    Period Weeks    Status On-going    Target Date 08/29/22     OT LONG TERM GOAL #2   Title Pt will complete UB and LB dressing with modified independence including buttons, snaps  and zippers.    Baseline requires min assist at eval, 10th visit: occasional assist with buttons, 20th:  able to perform one handed, but difficulty with bilateral UE; 08/17/21: pt reports inconsistent with 1 hand, reviewed techniques this visit, Visit 40:  Pt requires assist with bra   Time 12    Period Weeks    Status MET   Target Date 11/08/21     OT LONG TERM GOAL #3   Title Pt will perform shower transfer with modified independence.    Baseline Pt requires supervision to min assist for shower transfer at home. 10th visit: supervision; 08/17/21:  supv .  Visit 40:  Pt had met goal but had recent fall and now requiring min guard to supv   Time 6    Period Weeks    Status  Met    Target Date 11/08/21      OT LONG TERM GOAL #4 10/10/22 (12 weeks)    Title Pt will improve L hand grip to 25 or more #s to enable pt to open a new jar.   Baseline no grip in left hand at eval, 10th visit:  improved flexion but still working towards composite fisting and grip. 20th:  continues to demo decreased grip; 08/17/21: active digit flexion improving, but not yet able to register grip on dynamometer; 09/04/21: L grip 1# 7/17:  5#; 12/25/21: L grip 6#; 02/05/22: 13#; 03/12/22: L grip 12#; 1/23//24: L grip 3# (decreased); 04/25/22: L grip 14#; spouse assists to open new jars; 05/21/22: L grip 15#; 07/04/22: L grip 19#; 07/18/22: L grip 22#, difficulty opening a new jar (R grip 50#); 08/20/22: L grip 21# (increased weakness since Friday after adverse reaction to a new medication resulting in ED visit).   Time 12    Period Weeks    Status Revised/ongoing   Target Date 10/10/22     OT LONG TERM GOAL #5   Title Pt will improve left shoulder flexion to 100 degrees or better to improve reaching to obtain self care items from shelf/shoulder height.    Baseline difficulty with reach, shoulder flexion to 47 degrees; 08/17/21: L shoulder flexion 85, but not yet able to consistently hold ADL supplies in L hand when reaching; 09/04/21:  flexion 85 7/17:  shoulder flexion to 90; 12/25/21: 105 (P 125); 03/12/22: active L shoulder flexion 110 (pt uses R arm to reach for items at shoulder height or above); 04/16/22: L shoulder flexion 107; 04/25/22: L shoulder flexion 125*, R 132*    Time 12    Period Weeks    Status achieved   Target Date 04/25/22     OT LONG TERM GOAL #6   Title Pt will improve FOTO score to 47 or above to demonstrate a clinically relevant change in function to impact ADL tasks.    Baseline score of 30 at eval; 08/17/21: FOTO: 42; 09/04/21: FOTO : 54 , FOTO 7/7:  60; 12/25/21: 59; 03/12/22: 66; 04/16/22: 67; 04/25/22; 51; 05/21/22: 74; 07/04/22: 74; 07/18/22: 71; 08/20/22: 77   Time 12    Period Weeks    Status  MET/ongoing    Target Date 10/10/22     OT LONG TERM GOAL #7   Title Pt will demonstrate ability to pick up small objects with L hand and complete 9 hole peg test in less than 1 min (revised)   Baseline unable to perform at eval, 10th visit: still unable to pick up small pegs, 20th: unable to pick up pegs but improving; 08/17/21: not attempted d/t time constraints, will assess next visit; 09/04/21: 9 hole peg test in 5 min 53 sec;  7/17:  Pt unable to complete this date but was able to place 6 of 9 pegs in 4 mins 20 secs; 12/25/21: L 1 min 43 sec; 03/12/22: L 1 min 2 sec (requires non-skid surface to pick up small items from table top); 04/16/22: 5 pegs in 2 min 36 secs; 04/25/22: 1 min 48 sec; 05/21/22: L 1 min 23 sec; 07/04/22: 1 min 14 sec; 08/20/22: Able to complete 4 pegs in 3 min before OT stopped test (declined LUE motor  skills since ED visit Friday 08/16/22 d/t adverse reaction to a new med)   Time 12    Period Weeks    Status Revised/On-going    Target Date 10/10/22    OT LONG TERM GOAL #8   Title Pt will increase LUE strength by 1 MM grade in order to hold blow dryer in LUE to dry hair without dropping dryer.   Baseline 04/25/22: Unable to sustain grip and maintain lift of LUE with hand near head without dropping dryer.   Dryer has caused bruising above L eye with previous attempts.  L shoulder flex/abd 3/5, elbow flex/ext 3+/5; 07/04/22: L shoulder flex/abd 3+, elbow and wrist flex/ext 4+   Time 12    Period Weeks    Status New/ ongoing   Target Date 10/10/22        OT LONG TERM GOAL #9   Title Pt will increase L hand dexterity to enable pt to independently manage clothing fasteners.   Baseline 04/25/22: pt ties shoe laces loosely and requires assist from spouse occasionally.  When typing, pt engages the L hand for ~25% of the task.  Pt avoids wearing jeans d/t inability to manage button or zipper; 07/04/22: no change yet from 04/25/22; 07/18/22: Managed zipper today, but increased time and effort to manage buttons on pants; able to tie shoes loosely with increased time; 08/20/23: better efficiency with managing zipper on a jacket; pt manages buttons 1 handed (R); able to tie shoes without laces being loose   Time 12    Period Weeks    Status Ongoing   Target Date 10/10/22        OT LONG TERM GOAL #10   Title Pt will be able to independently carry a light plate of food or drink in L hand without spilling/dropping to increase efficiency with item transport in the kitchen.   Baseline 04/25/22: Pt requires constant cues to keep cup or plate level while multitasking in order to prevent spilling; 07/04/22: Pt reports she is carrying her coffee cup from kitchen to neighboring room (not yet consistent to avoid spills); 08/20/22: Pt has not been using L hand to carry items much since Friday d/t increased weakness since ED visit.   Time 12    Period Weeks    Status New/ongoing   Target Date 10/10/22     Plan -     Clinical Impression Statement Pt continues to make gains with L hand FMC/dexterity skills. Pt completes all clips using lateral and 3 point pinch grip, including black clips; however increased difficulty/time utilizing 3 point pinch. Pt improved to 1/4" ball pegs using L hand for [facility], requires R hand to stabilize  board. Pt will continue to work towards goals in plan of care to improve left UE functional use in ADL and IADL tasks at home and in the community.    OT Occupational Profile and History Detailed Assessment- Review of Records and additional review of physical, cognitive, psychosocial history related to current functional performance    Occupational performance deficits (Please refer to evaluation for details): ADL's;IADL's;Leisure;Rest and Sleep    Body Structure / Function / Physical Skills ADL;Coordination;Endurance;GMC;UE functional use;Balance;IADL;Pain;Dexterity;FMC;Strength;Edema;Mobility;ROM    Psychosocial Skills Environmental  Adaptations;Habits;Routines and Behaviors    Rehab Potential Good    Clinical Decision Making Several treatment options, min-mod task modification necessary   Comorbidities Affecting Occupational Performance: May have comorbidities impacting occupational performance    Modification or Assistance to Complete Evaluation  Min-Moderate modification of tasks or assist  with assess necessary to complete eval    OT Frequency 2x / week    OT Duration 12 weeks    OT Treatment/Interventions Self-care/ADL training;Cryotherapy;Paraffin;Therapeutic exercise;DME and/or AE instruction;Functional Mobility Training;Balance training;Electrical Stimulation;Ultrasound;Neuromuscular education;Manual Therapy;Splinting;Moist Heat;Contrast Bath;Passive range of motion;Therapeutic activities;Patient/family education;Coping strategies training    Plan OT recert    Consulted and Agree with Plan of Care Patient            Kathie Dike, M.S. OTR/L  09/17/22, 11:07 AM  ascom [phone]  Presley Raddle, OT 09/17/2022, 11:07 AM

## 2022-09-19 ENCOUNTER — Ambulatory Visit: Payer: Medicare HMO

## 2022-09-19 DIAGNOSIS — R278 Other lack of coordination: Secondary | ICD-10-CM

## 2022-09-19 DIAGNOSIS — I63511 Cerebral infarction due to unspecified occlusion or stenosis of right middle cerebral artery: Secondary | ICD-10-CM

## 2022-09-19 DIAGNOSIS — M6281 Muscle weakness (generalized): Secondary | ICD-10-CM | POA: Diagnosis not present

## 2022-09-19 DIAGNOSIS — R2681 Unsteadiness on feet: Secondary | ICD-10-CM

## 2022-09-19 DIAGNOSIS — R262 Difficulty in walking, not elsewhere classified: Secondary | ICD-10-CM

## 2022-09-19 DIAGNOSIS — R2689 Other abnormalities of gait and mobility: Secondary | ICD-10-CM | POA: Diagnosis not present

## 2022-09-19 NOTE — Therapy (Signed)
OUTPATIENT PHYSICAL THERAPY NEURO TREATMENT   Patient Name: Teresa [name] MRN: [id number] DOB:04/29/1940, 82 y.o., female Today's Date: 09/19/2022   PCP: Dr. Aram Beecham REFERRING PROVIDER: Dr. Aram Beecham  END OF SESSION:  PT End of Session - 09/19/22 1031     Visit Number 28    Number of Visits 41    Date for PT Re-Evaluation 11/05/22    Authorization Type [employer] PPO-    Authorization Time Period 05/23/2022- 08/15/2022    Progress Note Due on Visit 10    PT Start Time 1016    Equipment Utilized During Treatment Gait belt    Activity Tolerance Patient tolerated treatment well    Behavior During Therapy [facility] for tasks assessed/performed                             Past Medical History:  Diagnosis Date   Cancer (HCC)    skin   Hypothyroidism    Raynaud's disease    Stroke [facility])    Past Surgical History:  Procedure Laterality Date   ABDOMINAL HYSTERECTOMY  1991   APPENDECTOMY  1991   BACK SURGERY  2010   Cervical fusion C4-5-6-7   CATARACT EXTRACTION, BILATERAL Bilateral 10/2016   CHOLECYSTECTOMY  1995   COLONOSCOPY WITH PROPOFOL N/A 12/08/2018   Procedure: COLONOSCOPY WITH PROPOFOL;  Surgeon: Toledo, Boykin Nearing, MD;  Location: [facility] ENDOSCOPY;  Service: Gastroenterology;  Laterality: N/A;   EYE SURGERY     Patient Active Problem List   Diagnosis Date Noted   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   Stroke (HCC) 02/27/2021   History of nonmelanoma skin cancer 05/23/2014    ONSET DATE: 04/16/2022  REFERRING DIAG: 69.354 (ICD-10-CM) - Hemiplegia and hemiparesis following cerebral infarction affecting left non-dominant side   THERAPY DIAG:  Unsteadiness on feet  Difficulty in walking, not elsewhere classified  Other lack of coordination  Other abnormalities of gait and mobility  Muscle weakness (generalized)  Right middle cerebral artery stroke (HCC)  Rationale for Evaluation and Treatment:  Rehabilitation  SUBJECTIVE:                                                                                                                                                                                             SUBJECTIVE STATEMENT: Pt reports no new changes since last visit. Reports not too sore after last visit    Pt accompanied by: self  PERTINENT HISTORY: Pt is a 82 y.o. female with referral for hemiplegia with left sided weakness -original CVA on 02/26/21 and new onset  of left sided weakness on 04/16/2022.  R MCA CVA on 02/26/21. Pt completed outpatient PT on 01/17/2022 with majority of goals met. PMH includes: skin cancer, hypothyroidism, and Raynaud's disease, history of cervical fusion in 2010 C4-C7.   PAIN:  Are you having pain?  no  PRECAUTIONS: Fall  WEIGHT BEARING RESTRICTIONS: No  FALLS: Has patient fallen in last 6 months? No  LIVING ENVIRONMENT: Lives with: lives with their spouse Lives in: House/apartment Stairs: Yes: External: 1 steps; none Has following equipment at home: Single point cane, Walker - 2 wheeled, shower chair, and Grab bars  PLOF: Independent  PATIENT GOALS: I want to walk without this cane and no falls and enjoy going out to grandkids sporting event  OBJECTIVE:   DIAGNOSTIC FINDINGS:   5/24:  EXAM: CT HEAD WITHOUT CONTRAST  IMPRESSION: 1. No evidence of acute intracranial abnormality. 2. Remote right MCA territory infarct.     CLINICAL DATA:  Stroke suspected   EXAM: MRI HEAD WITHOUT CONTRAST   TECHNIQUE: Multiplanar, multiecho pulse sequences of the brain and surrounding structures were obtained without intravenous contrast.   COMPARISON:  MRI Head 02/26/21, CT Head 04/16/22   FINDINGS: Brain: Evolving right MCA territory infarct involving the right frontal operculum in the right insular cortex. No acute infarction, hemorrhage, hydrocephalus, extra-axial collection or mass lesion. Small focus of microhemorrhage in the left  temporal lobe. There is petechial hemorrhage and/or hemosiderosis in the region of prior right MCA territory infarct. T2/stir hyperintense signal in the right cerebral peduncle is favored to represent transneuronal degeneration.   Vascular: Major flow voids are preserved.   Skull and upper cervical spine: Normal marrow signal.   Sinuses/Orbits: Bilateral lens replacement. No mastoid or middle ear effusion. Paranasal sinuses are clear.   Other: None.   IMPRESSION: 1. No acute intracranial abnormality. 2. 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 3.     Electronically Signed   By: Lorenza Cambridge M.D.   On: 04/16/2022 13:41  COGNITION: Overall cognitive status: Within functional limits for tasks assessed   SENSATION: WFL  COORDINATION: Intact with each LE  EDEMA:  None observed  MUSCLE TONE: LLE: Within functional limits  DTRs:  Patella 2+ = Normal and Achilles 2+ = Normal  POSTURE: rounded shoulders and forward head  LOWER EXTREMITY ROM:     Active  Right Eval Left Eval  Hip flexion WNL THROUGHOUT LE WNL THROUGHOUT LE  Hip extension    Hip abduction    Hip adduction    Hip internal rotation    Hip external rotation    Knee flexion    Knee extension    Ankle dorsiflexion    Ankle plantarflexion    Ankle inversion    Ankle eversion     (Blank rows = not tested)  LOWER EXTREMITY MMT:    MMT Right Eval Left Eval  Hip flexion 4+ 4  Hip extension 4+ 4  Hip abduction 4+ 4  Hip adduction 4+ 4  Hip internal rotation 4+ 4  Hip external rotation 4+ 4  Knee flexion 4+ 4  Knee extension 5 4  Ankle dorsiflexion 5 4  Ankle plantarflexion    Ankle inversion    Ankle eversion    (Blank rows = not tested)  BED MOBILITY:  Patient reports independent with all bed mobility and states has returned to master bed  TRANSFERS: Assistive device utilized: None  Sit to stand: Complete Independence Stand  to sit:  Complete Independence Chair to chair: Complete Independence Floor:  Not tested   GAIT: Gait pattern: step through pattern, decreased arm swing- Right, decreased arm swing- Left, decreased step length- Right, and decreased step length- Left Distance walked: 915 feet Assistive device utilized: None Level of assistance: SBA Comments: occasional scuff or decreased left foot clearance  FUNCTIONAL TESTS:  5 times sit to stand: 23.62 sec without UE support Timed up and go (TUG): 20.14 sec without an AD 10 meter walk test: 0.70 m/s  PATIENT SURVEYS:  FOTO 60/ goal of 66  TODAY'S TREATMENT:                                                                                                                              DATE: 09/17/2022 Gait belt donned and CGA provided throughout unless specified otherwise      NMR  Dynamic  (diagonal/forward) stepping over tapped line on floor x 10 feet x 8  Dynamic (diagonal/forward) stepping over 1/2 foam roll x 8 Dynamic walking (forward/retro/sidestepping)  Dynamic walking (left hand holding a cone with 1/2 spike ball on top) - 160 feet x 2 trials (1st trial- 5 drops and 2nd trial with only 1 drop)   SLB 2x30 sec each LE (multiple attempts intially standing on left but able to perform x 30sec x 2)    Forward step tap onto 6 " block-15 reps each LE without any UE support.   Therex:   Sit to stand with dual task of holding onto cone with 1/2 spike ball on top (simulate ice cream cone)  10 reps x 2 sets    PATIENT EDUCATION: Education details: Pt educated throughout session about proper posture and technique with exercises. Improved exercise technique, movement at target joints, use of target muscles after min to mod verbal, visual, tactile cues.  Person educated: Patient Education method: Explanation, Demonstration, Tactile cues, and Verbal cues Education comprehension: verbalized understanding, returned demonstration, verbal cues required,  tactile cues required, and needs further education  HOME EXERCISE PROGRAM: To be updated next 1-2 visit  GOALS: Goals reviewed with patient? Yes  SHORT TERM GOALS: Target date: 07/04/2022  Patient will be independent in home exercise program to improve strength/mobility for better functional independence with ADLs  Baseline: EVAL: Patient reports mostly just walking; 07/09/22: Pt reports her exercises are going well, to be updated future date Goal status: IN PROGRESS  LONG TERM GOALS: Target date: 11/05/2022    Pt will decrease 5TSTS by at least 6 seconds in order to demonstrate clinically significant improvement in LE strength. Baseline: EVAL= 23.62 sec without UE; 07/09/22:  13 seconds hands-free  Goal status: MET  2. Pt will improve FOTO to target score of 66 to display perceived improvements in ability to complete ADL's.  Baseline: Eval= 60; 07/09/22: 65 5/21: 59% Goal status: PARTIALLY MET  3.  Pt will decrease TUG to below 14 seconds/decrease in order to demonstrate decreased fall risk. Baseline: Eval= 20.14  sec without AD; 07/09/22: 11 seconds no AD Goal status: MET  4.  Patient will increase six minute walk test distance to >1200 ft for improve gait ability and return to PLOF to complete her walks in her neighborhood.  Baseline: EVAL= 915 feet without an AD; 07/09/22: 1184 ft without AD 5/21: 1060 ft  Goal status: Partially MET  5. Pt will increase by at least 0.13 m/s in order to demonstrate clinically significant improvement in community ambulation.    Baseline: Eval = 0.70 m/s; 07/09/2022: 1.04 m/s without AD   Goal status: MET   6. Pt will report that she has not hit LLE/LUE on doorframe/walls/furniture while ambulating in the past two weeks to indicate improved ability to scan environment and to decrease injury risk with gait.  Baseline: currently has hit furniture/doorframe 2x/week 5/21: hitting doorframe less frequently. 08/23/2022- Patient reports no bumping arms  into any walls or furniture since last tested on 08/13/2022.   Goal status: Ongoing  7. Patient will increase Functional Gait Assessment score to >20/30 as to reduce fall risk and improve dynamic gait safety with community ambulation.  Baseline:5/21: 13/30  Goal status: NEW   ASSESSMENT:  CLINICAL IMPRESSION: Pt challenged with dual task with some observed inattention to left side. She was able to respond to [facility] and vastly improved with practice. She also responded well to diagonal walking with improved coordination with tasks. The pt will benefit from skilled PT services to improve her overall left LE muscle strength and improve her mobility to decrease her fall risk and improve her overall quality of life.   OBJECTIVE IMPAIRMENTS: Abnormal gait, decreased activity tolerance, decreased balance, decreased coordination, decreased endurance, decreased mobility, difficulty walking, and decreased strength.   ACTIVITY LIMITATIONS: carrying, lifting, bending, standing, squatting, stairs, and transfers  PARTICIPATION LIMITATIONS: cleaning, laundry, driving, shopping, community activity, and yard work  PERSONAL FACTORS: 3+ comorbidities: OA, HTN, hx of previous CVA  are also affecting patient's functional outcome.   REHAB POTENTIAL: Good  CLINICAL DECISION MAKING: Evolving/moderate complexity  EVALUATION COMPLEXITY: Moderate  PLAN:  PT FREQUENCY: 1-2x/week  PT DURATION: 12 weeks  PLANNED INTERVENTIONS: Therapeutic exercises, Therapeutic activity, Neuromuscular re-education, Balance training, Gait training, Patient/Family education, Self Care, Joint mobilization, Joint manipulation, Stair training, Vestibular training, Canalith repositioning, DME instructions, Dry Needling, Electrical stimulation, Spinal manipulation, Spinal mobilization, Cryotherapy, Moist heat, Taping, and Manual therapy  PLAN FOR NEXT SESSION:   Progress gait training to include dual-cog tasks, BLE endurance & dynamic  balance.    Lenda Kelp PT  [facility]  09/19/2022, 10:31 AM   Physical Therapist - [facility]  Outpatient Physical Therapy- Main Campus [phone]

## 2022-09-19 NOTE — Therapy (Signed)
OUTPATIENT OCCUPATIONAL THERAPY NEURO TREATMENT NOTE        Patient Name: Teresa [name] MRN: [id number] DOB:[birth year], 82 y.o., female Today's Date: 05/23/2022  PCP: Dr. Aram Beecham REFERRING PROVIDER: Dr. Aram Beecham   OT End of Session - 09/19/22 1101     Visit Number [id number]    Number of Visits 138    Date for OT Re-Evaluation 10/10/22    Authorization Time Period Reporting period beginning 08/20/22    Progress Note Due on Visit 10    OT Start Time 1105    OT Stop Time 1145    OT Time Calculation (min) 40 min    Equipment Utilized During Treatment cane    Activity Tolerance Patient tolerated treatment well    Behavior During Therapy WFL for tasks assessed/performed            Past Medical History:  Diagnosis Date   Cancer (HCC)    skin   Hypothyroidism    Raynaud's disease    Stroke [facility])    Past Surgical History:  Procedure Laterality Date   ABDOMINAL HYSTERECTOMY  1991   APPENDECTOMY  1991   BACK SURGERY  2010   Cervical fusion C4-5-6-7   CATARACT EXTRACTION, BILATERAL Bilateral 10/2016   CHOLECYSTECTOMY  1995   COLONOSCOPY WITH PROPOFOL N/A 12/08/2018   Procedure: COLONOSCOPY WITH PROPOFOL;  Surgeon: Toledo, Boykin Nearing, MD;  Location: [facility] ENDOSCOPY;  Service: Gastroenterology;  Laterality: N/A;   EYE SURGERY     Patient Active Problem List   Diagnosis Date Noted   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  Rationale for Evaluation and Treatment Rehabilitation  PERTINENT HISTORY: February 26, 2021, pt reports she had a CVA, came to [facility] to ER and then was transferred to [facility] in [place] where she was admitted and after acute care she went to inpatient rehab.  Following inpt rehab, pt went home and had home health.   PRECAUTIONS: fall  SUBJECTIVE: Pt  reports she is working on crocheting baby blankets.   PAIN:  Are you having pain? none  OBJECTIVE:  L grip 6, R grip 41 L lateral pinch 5, R 13 L 3 point pinch 4, R 14  L 9 hole 1 min 43 sec. L shoulder active flexion 0-105, passive 0-125 02/05/22: L grip 13#  L lateral pinch 8#  3 point pinch 5#  L shoulder flex 0-108 03/12/22:  L grip: 12# L lateral pinch: 8# 3 point pinch: 6# L 9 hole: 1 min 3 sec L shoulder flexion 0-110 04/16/22: L grip: 3# L lateral pinch: 4# 3 point pinch: difficulty maintaining 3 point pinch  L 9 hole: 5 pegs in 2 min 36 sec  L shoulder flexion 0-107 BP L 132/79 HR 80 02 sats 96% 04/25/22: L grip: 14#; R grip: 50# L lateral 2#, R lateral 17# 3 point pinch: L 4#, R 14# (Saehan pinch gauge) 9 hole peg test: 1 min, 48 sec L shoulder flex 125 (R shoulder 132 active) 05/21/22: L grip: 15#; R grip: 50# L lateral 8#, R lateral 20# 3 point pinch: L 4#, R 12# (Saehan pinch gauge) 9 hole peg test: 1 min, 23 sec 07/04/22: L grip: 19# L lateral 10# 3 point pinch: L 6# (standard pinch gauge-not Saehan) 9 hole peg test: 1 min,  14 sec 07/18/22:  L grip 22# L lateral: 9# L 3 point pinch: 6# L 9 hole: 1 min 8 sec 08/20/22: (Feeling sluggish since 08/16/22 after ED visit for medication adverse reaction) L grip: 21# L lateral: 7# L 3 point pinch: 7# (standard pinch gauge) L 9 hole: 4 pegs in 3 min (OT stopped at 3 min).  08/23/22:  L 9 hole: 1 min 12 sec  TODAY'S TREATMENT: Therapeutic Exercise:  Pt completed 1.5lb digi-flex for L finger strengthening, cues for digit isolation. Pt snapped and unsnapped snap beads with emphasis on finger strengthening. Facilitated pinch strengthening with use of therapy resistant clothespins to target lateral and 3 point pinch of L hand.  Pt managed all colors including black, most resistive pins, clipping on/off a vertical dowel.   Therapeutic Activity: Facilitated [facility] with the L hand and L attention while flipping cards.  Pt sorted by colors then by suits working on progressively increasing the speed while maintaining control. Sorted deck in numerical order with use of L hand only to spread and arrange cards.   PATIENT EDUCATION: Education details: L hand FMC/dexterity skills with reduced compensatory movements Person educated: pt Education method: explanation, vc, tactile cues, demo Education comprehension: verbalized understanding, further training needed  HOME EXERCISE PROGRAM Continue to engage LUE into ADLs; continue use of putty for gripping and pinching exercises for LUE, and L shoulder AROM/AAROM, crocheting, typing; increase participation in IADL tasks for greater use of L hand.   OT Short Term Goals - 08/29/22 (6 weeks)      OT SHORT TERM GOAL #1   Title Pt will be independent with home exercise program.    Baseline Eval: no current program, 10th visit:  continue to add new exercises as pt progresses, 20th:  continue to update HEP; 08/17/21: continue to progress HEP when indicated.  Visit 40: adding new exercises as pt progresses; 12/25/21: ongoing with progressions; 03/12/22: inconsistent use of putty but regular use of stress ball (every other day).  Encouraged pt return to daily putty use (2x daily when able) to target grip and pinch strengthening; 04/16/22: not reviewed today d/t OT sent pt to ED; 04/25/22: added pulleys and pt has re-started putty; 05/21/22: stopped pulleys and added stronger theraputty and encouraged pt crochet often; 07/04/22: pt working consistently with putty and grip strengthener at 20# at home; 07/18/22: pt continues to work on putty and Building surveyor and will be starting with her crocheting again to progress [facility] skills; 08/20/22: Pt continues to use her grip strengthener daily set at 20#; will continue to modify as needed   Time 12    Period Weeks    Status On-going    Target Date 08/29/22     OT LONG TERM GOAL #2   Title Pt will complete UB and LB dressing with modified  independence including buttons, snaps and zippers.    Baseline requires min assist at eval, 10th visit: occasional assist with buttons, 20th:  able to perform one handed, but difficulty with bilateral UE; 08/17/21: pt reports inconsistent with 1 hand, reviewed techniques this visit, Visit 40:  Pt requires assist with bra   Time 12    Period Weeks    Status MET   Target Date 11/08/21     OT LONG TERM GOAL #3   Title Pt will perform shower transfer with modified independence.    Baseline Pt requires supervision to min assist for shower transfer at home. 10th visit: supervision; 08/17/21: supv .  Visit 40:  Pt had met goal but had recent fall and now requiring min guard to supv   Time 6    Period Weeks    Status  Met    Target Date 11/08/21      OT LONG TERM GOAL #4 10/10/22 (12 weeks)    Title Pt will improve L hand grip to 25 or more #s to enable pt to open a new jar.   Baseline no grip in left hand at eval, 10th visit:  improved flexion but still working towards composite fisting and grip. 20th:  continues to demo decreased grip; 08/17/21: active digit flexion improving, but not yet able to register grip on dynamometer; 09/04/21: L grip 1# 7/17:  5#; 12/25/21: L grip 6#; 02/05/22: 13#; 03/12/22: L grip 12#; 1/23//24: L grip 3# (decreased); 04/25/22: L grip 14#; spouse assists to open new jars; 05/21/22: L grip 15#; 07/04/22: L grip 19#; 07/18/22: L grip 22#, difficulty opening a new jar (R grip 50#); 08/20/22: L grip 21# (increased weakness since Friday after adverse reaction to a new medication resulting in ED visit).   Time 12    Period Weeks    Status Revised/ongoing   Target Date 10/10/22     OT LONG TERM GOAL #5   Title Pt will improve left shoulder flexion to 100 degrees or better to improve reaching to obtain self care items from shelf/shoulder height.    Baseline difficulty with reach, shoulder flexion to 47 degrees; 08/17/21: L shoulder flexion 85, but not yet able to consistently hold ADL  supplies in L hand when reaching; 09/04/21: flexion 85 7/17:  shoulder flexion to 90; 12/25/21: 105 (P 125); 03/12/22: active L shoulder flexion 110 (pt uses R arm to reach for items at shoulder height or above); 04/16/22: L shoulder flexion 107; 04/25/22: L shoulder flexion 125*, R 132*    Time 12    Period Weeks    Status achieved   Target Date 04/25/22     OT LONG TERM GOAL #6   Title Pt will improve FOTO score to 47 or above to demonstrate a clinically relevant change in function to impact ADL tasks.    Baseline score of 30 at eval; 08/17/21: FOTO: 42; 09/04/21: FOTO : 54 , FOTO 7/7:  60; 12/25/21: 59; 03/12/22: 66; 04/16/22: 67; 04/25/22; 51; 05/21/22: 74; 07/04/22: 74; 07/18/22: 71; 08/20/22: 77   Time 12    Period Weeks    Status  MET/ongoing    Target Date 10/10/22     OT LONG TERM GOAL #7   Title Pt will demonstrate ability to pick up small objects with L hand and complete 9 hole peg test in less than 1 min (revised)   Baseline unable to perform at eval, 10th visit: still unable to pick up small pegs, 20th: unable to pick up pegs but improving; 08/17/21: not attempted d/t time constraints, will assess next visit; 09/04/21: 9 hole peg test in 5 min 53 sec;  7/17:  Pt unable to complete this date but was able to place 6 of 9 pegs in 4 mins 20 secs; 12/25/21: L 1 min 43 sec; 03/12/22: L 1 min 2 sec (requires non-skid surface to pick up small items from table top); 04/16/22: 5 pegs in 2 min 36 secs; 04/25/22: 1 min 48 sec; 05/21/22: L 1 min 23 sec; 07/04/22: 1 min 14 sec; 08/20/22: Able to complete 4 pegs in 3 min before OT stopped test (declined LUE motor skills since ED visit Friday 08/16/22  d/t adverse reaction to a new med)   Time 12    Period Weeks    Status Revised/On-going    Target Date 10/10/22    OT LONG TERM GOAL #8   Title Pt will increase LUE strength by 1 MM grade in order to hold blow dryer in LUE to dry hair without dropping dryer.   Baseline 04/25/22: Unable to sustain grip and maintain lift of LUE  with hand near head without dropping dryer.  Dryer has caused bruising above L eye with previous attempts.  L shoulder flex/abd 3/5, elbow flex/ext 3+/5; 07/04/22: L shoulder flex/abd 3+, elbow and wrist flex/ext 4+   Time 12    Period Weeks    Status New/ ongoing   Target Date 10/10/22        OT LONG TERM GOAL #9   Title Pt will increase L hand dexterity to enable pt to independently manage clothing fasteners.   Baseline 04/25/22: pt ties shoe laces loosely and requires assist from spouse occasionally.  When typing, pt engages the L hand for ~25% of the task.  Pt avoids wearing jeans d/t inability to manage button or zipper; 07/04/22: no change yet from 04/25/22; 07/18/22: Managed zipper today, but increased time and effort to manage buttons on pants; able to tie shoes loosely with increased time; 08/20/23: better efficiency with managing zipper on a jacket; pt manages buttons 1 handed (R); able to tie shoes without laces being loose   Time 12    Period Weeks    Status Ongoing   Target Date 10/10/22        OT LONG TERM GOAL #10   Title Pt will be able to independently carry a light plate of food or drink in L hand without spilling/dropping to increase efficiency with item transport in the kitchen.   Baseline 04/25/22: Pt requires constant cues to keep cup or plate level while multitasking in order to prevent spilling; 07/04/22: Pt reports she is carrying her coffee cup from kitchen to neighboring room (not yet consistent to avoid spills); 08/20/22: Pt has not been using L hand to carry items much since Friday d/t increased weakness since ED visit.   Time 12    Period Weeks    Status New/ongoing   Target Date 10/10/22     Plan -     Clinical Impression Statement Pt continues to make gains with L hand FMC/dexterity skills. Pt completes all clips using lateral and 3 point pinch grip, including black clips. Focused on dexterity sorting cards using L hand, emphasis on speed with difficulty maintaining  control. Unable to shuffle cards. Pt will continue to work towards goals in plan of care to improve left UE functional use in ADL and IADL tasks at home and in the community.    OT Occupational Profile and History Detailed Assessment- Review of Records and additional review of physical, cognitive, psychosocial history related to current functional performance    Occupational performance deficits (Please refer to evaluation for details): ADL's;IADL's;Leisure;Rest and Sleep    Body Structure / Function / Physical Skills ADL;Coordination;Endurance;GMC;UE functional use;Balance;IADL;Pain;Dexterity;FMC;Strength;Edema;Mobility;ROM    Psychosocial Skills Environmental  Adaptations;Habits;Routines and Behaviors    Rehab Potential Good    Clinical Decision Making Several treatment options, min-mod task modification necessary   Comorbidities Affecting Occupational Performance: May have comorbidities impacting occupational performance    Modification or Assistance to Complete Evaluation  Min-Moderate modification of tasks or assist with assess necessary to complete eval    OT Frequency  2x / week    OT Duration 12 weeks    OT Treatment/Interventions Self-care/ADL training;Cryotherapy;Paraffin;Therapeutic exercise;DME and/or AE instruction;Functional Mobility Training;Balance training;Electrical Stimulation;Ultrasound;Neuromuscular education;Manual Therapy;Splinting;Moist Heat;Contrast Bath;Passive range of motion;Therapeutic activities;Patient/family education;Coping strategies training    Plan OT recert    Consulted and Agree with Plan of Care Patient            Kathie Dike, M.S. OTR/L  09/19/22, 11:05 AM  ascom [phone]  Presley Raddle, OT 09/19/2022, 11:05 AM

## 2022-09-24 ENCOUNTER — Ambulatory Visit: Payer: Medicare HMO

## 2022-09-24 ENCOUNTER — Ambulatory Visit: Payer: Medicare HMO | Attending: Internal Medicine

## 2022-09-24 DIAGNOSIS — R2689 Other abnormalities of gait and mobility: Secondary | ICD-10-CM | POA: Insufficient documentation

## 2022-09-24 DIAGNOSIS — M6281 Muscle weakness (generalized): Secondary | ICD-10-CM | POA: Diagnosis not present

## 2022-09-24 DIAGNOSIS — R262 Difficulty in walking, not elsewhere classified: Secondary | ICD-10-CM

## 2022-09-24 DIAGNOSIS — I63511 Cerebral infarction due to unspecified occlusion or stenosis of right middle cerebral artery: Secondary | ICD-10-CM

## 2022-09-24 DIAGNOSIS — R2681 Unsteadiness on feet: Secondary | ICD-10-CM | POA: Diagnosis not present

## 2022-09-24 DIAGNOSIS — R278 Other lack of coordination: Secondary | ICD-10-CM | POA: Insufficient documentation

## 2022-09-24 NOTE — Therapy (Signed)
OUTPATIENT PHYSICAL THERAPY NEURO TREATMENT   Patient Name: Teresa [name] MRN: [id number] DOB:05-24-1940, 82 y.o., female Today's Date: 09/25/2022   PCP: Dr. Aram Beecham REFERRING PROVIDER: Dr. Aram Beecham  END OF SESSION:  PT End of Session - 09/24/22 1022     Visit Number 29    Number of Visits 41    Date for PT Re-Evaluation 11/05/22    Authorization Type [employer] PPO-    Authorization Time Period 05/23/2022- 08/15/2022    Progress Note Due on Visit 10    PT Start Time 1018    PT Stop Time 1059    PT Time Calculation (min) 41 min    Equipment Utilized During Treatment Gait belt    Activity Tolerance Patient tolerated treatment well    Behavior During Therapy WFL for tasks assessed/performed                             Past Medical History:  Diagnosis Date   Cancer (HCC)    skin   Hypothyroidism    Raynaud's disease    Stroke [facility])    Past Surgical History:  Procedure Laterality Date   ABDOMINAL HYSTERECTOMY  1991   APPENDECTOMY  1991   BACK SURGERY  2010   Cervical fusion C4-5-6-7   CATARACT EXTRACTION, BILATERAL Bilateral 10/2016   CHOLECYSTECTOMY  1995   COLONOSCOPY WITH PROPOFOL N/A 12/08/2018   Procedure: COLONOSCOPY WITH PROPOFOL;  Surgeon: Toledo, Boykin Nearing, MD;  Location: [facility] ENDOSCOPY;  Service: Gastroenterology;  Laterality: N/A;   EYE SURGERY     Patient Active Problem List   Diagnosis Date Noted   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   Stroke (HCC) 02/27/2021   History of nonmelanoma skin cancer 05/23/2014    ONSET DATE: 04/16/2022  REFERRING DIAG: 69.354 (ICD-10-CM) - Hemiplegia and hemiparesis following cerebral infarction affecting left non-dominant side   THERAPY DIAG:  Unsteadiness on feet  Difficulty in walking, not elsewhere classified  Other lack of coordination  Other abnormalities of gait and mobility  Muscle weakness (generalized)  Right middle cerebral artery stroke  (HCC)  Rationale for Evaluation and Treatment: Rehabilitation  SUBJECTIVE:                                                                                                                                                                                             SUBJECTIVE STATEMENT: Pt reports doing well without falls. Reports still really too hot to walk outside but might try to walk with husband tonight.   Pt accompanied by: self  PERTINENT  HISTORY: Pt is a 82 y.o. female with referral for hemiplegia with left sided weakness -original CVA on 02/26/21 and new onset of left sided weakness on 04/16/2022.  R MCA CVA on 02/26/21. Pt completed outpatient PT on 01/17/2022 with majority of goals met. PMH includes: skin cancer, hypothyroidism, and Raynaud's disease, history of cervical fusion in 2010 C4-C7.   PAIN:  Are you having pain?  no  PRECAUTIONS: Fall  WEIGHT BEARING RESTRICTIONS: No  FALLS: Has patient fallen in last 6 months? No  LIVING ENVIRONMENT: Lives with: lives with their spouse Lives in: House/apartment Stairs: Yes: External: 1 steps; none Has following equipment at home: Single point cane, Walker - 2 wheeled, shower chair, and Grab bars  PLOF: Independent  PATIENT GOALS: I want to walk without this cane and no falls and enjoy going out to grandkids sporting event  OBJECTIVE:   DIAGNOSTIC FINDINGS:   5/24:  EXAM: CT HEAD WITHOUT CONTRAST  IMPRESSION: 1. No evidence of acute intracranial abnormality. 2. Remote right MCA territory infarct.     CLINICAL DATA:  Stroke suspected   EXAM: MRI HEAD WITHOUT CONTRAST   TECHNIQUE: Multiplanar, multiecho pulse sequences of the brain and surrounding structures were obtained without intravenous contrast.   COMPARISON:  MRI Head 02/26/21, CT Head 04/16/22   FINDINGS: Brain: Evolving right MCA territory infarct involving the right frontal operculum in the right insular cortex. No acute infarction, hemorrhage,  hydrocephalus, extra-axial collection or mass lesion. Small focus of microhemorrhage in the left temporal lobe. There is petechial hemorrhage and/or hemosiderosis in the region of prior right MCA territory infarct. T2/stir hyperintense signal in the right cerebral peduncle is favored to represent transneuronal degeneration.   Vascular: Major flow voids are preserved.   Skull and upper cervical spine: Normal marrow signal.   Sinuses/Orbits: Bilateral lens replacement. No mastoid or middle ear effusion. Paranasal sinuses are clear.   Other: None.   IMPRESSION: 1. No acute intracranial abnormality. 2. 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 3.     Electronically Signed   By: Lorenza Cambridge M.D.   On: 04/16/2022 13:41  COGNITION: Overall cognitive status: Within functional limits for tasks assessed   SENSATION: WFL  COORDINATION: Intact with each LE  EDEMA:  None observed  MUSCLE TONE: LLE: Within functional limits  DTRs:  Patella 2+ = Normal and Achilles 2+ = Normal  POSTURE: rounded shoulders and forward head  LOWER EXTREMITY ROM:     Active  Right Eval Left Eval  Hip flexion WNL THROUGHOUT LE WNL THROUGHOUT LE  Hip extension    Hip abduction    Hip adduction    Hip internal rotation    Hip external rotation    Knee flexion    Knee extension    Ankle dorsiflexion    Ankle plantarflexion    Ankle inversion    Ankle eversion     (Blank rows = not tested)  LOWER EXTREMITY MMT:    MMT Right Eval Left Eval  Hip flexion 4+ 4  Hip extension 4+ 4  Hip abduction 4+ 4  Hip adduction 4+ 4  Hip internal rotation 4+ 4  Hip external rotation 4+ 4  Knee flexion 4+ 4  Knee extension 5 4  Ankle dorsiflexion 5 4  Ankle plantarflexion    Ankle inversion    Ankle eversion    (Blank rows = not tested)  BED MOBILITY:  Patient reports independent with all bed mobility and  states has returned to  master bed  TRANSFERS: Assistive device utilized: None  Sit to stand: Complete Independence Stand to sit: Complete Independence Chair to chair: Complete Independence Floor:  Not tested   GAIT: Gait pattern: step through pattern, decreased arm swing- Right, decreased arm swing- Left, decreased step length- Right, and decreased step length- Left Distance walked: 915 feet Assistive device utilized: None Level of assistance: SBA Comments: occasional scuff or decreased left foot clearance  FUNCTIONAL TESTS:  5 times sit to stand: 23.62 sec without UE support Timed up and go (TUG): 20.14 sec without an AD 10 meter walk test: 0.70 m/s  PATIENT SURVEYS:  FOTO 60/ goal of 66  TODAY'S TREATMENT:                                                                                                                              DATE: 09/17/2022 Gait belt donned and CGA provided throughout unless specified otherwise      NMR:  High knee march 4# AW- in // bars CGA- down and back x 4 Walking ham curls- 4# AW- in // bars CGA- down and back x 4 Side step with squat 4# AW in // bars - CGA  Carioca- 4# down and back x 4 (increased difficulty with coordinating task yet vastly improved with practice on last 2 trials)  Dynamic walking (forward/retro) with 4# AW- Focusing on taking larger steps back (counting- between 6-7 steps required backward)    SLB multiple attempts each LE - from 6 sec to 20 sec each LE  Tandem forward/retro in // bars 4#AW- down and back x 4      Therex:   Sit to stand with dual task of UE overhead raise x 15 reps   Resistive gait 350 feet without an AD and 4# AW - no unsteadiness or LOB. Patient reports no significant fatigue   PATIENT EDUCATION: Education details: Pt educated throughout session about proper posture and technique with exercises. Improved exercise technique, movement at target joints, use of target muscles after min to mod verbal, visual, tactile  cues.  Person educated: Patient Education method: Explanation, Demonstration, Tactile cues, and Verbal cues Education comprehension: verbalized understanding, returned demonstration, verbal cues required, tactile cues required, and needs further education  HOME EXERCISE PROGRAM: To be updated next 1-2 visit  GOALS: Goals reviewed with patient? Yes  SHORT TERM GOALS: Target date: 07/04/2022  Patient will be independent in home exercise program to improve strength/mobility for better functional independence with ADLs  Baseline: EVAL: Patient reports mostly just walking; 07/09/22: Pt reports her exercises are going well, to be updated future date Goal status: IN PROGRESS  LONG TERM GOALS: Target date: 11/05/2022    Pt will decrease 5TSTS by at least 6 seconds in order to demonstrate clinically significant improvement in LE strength. Baseline: EVAL= 23.62 sec without UE; 07/09/22:  13 seconds hands-free  Goal status: MET  2. Pt will improve FOTO to  target score of 66 to display perceived improvements in ability to complete ADL's.  Baseline: Eval= 60; 07/09/22: 65 5/21: 59% Goal status: PARTIALLY MET  3.  Pt will decrease TUG to below 14 seconds/decrease in order to demonstrate decreased fall risk. Baseline: Eval= 20.14 sec without AD; 07/09/22: 11 seconds no AD Goal status: MET  4.  Patient will increase six minute walk test distance to >1200 ft for improve gait ability and return to PLOF to complete her walks in her neighborhood.  Baseline: EVAL= 915 feet without an AD; 07/09/22: 1184 ft without AD 5/21: 1060 ft  Goal status: Partially MET  5. Pt will increase by at least 0.13 m/s in order to demonstrate clinically significant improvement in community ambulation.    Baseline: Eval = 0.70 m/s; 07/09/2022: 1.04 m/s without AD   Goal status: MET   6. Pt will report that she has not hit LLE/LUE on doorframe/walls/furniture while ambulating in the past two weeks to indicate improved  ability to scan environment and to decrease injury risk with gait.  Baseline: currently has hit furniture/doorframe 2x/week 5/21: hitting doorframe less frequently. 08/23/2022- Patient reports no bumping arms into any walls or furniture since last tested on 08/13/2022.   Goal status: Ongoing  7. Patient will increase Functional Gait Assessment score to >20/30 as to reduce fall risk and improve dynamic gait safety with community ambulation.  Baseline:5/21: 13/30  Goal status: NEW   ASSESSMENT:  CLINICAL IMPRESSION: Patient continues to struggle initially with coordination activities but does demonstrate improvement with practice. She was introduced to [place] today and was able to demo some progress with tandem gait - more narrow today without difficulty. The pt will benefit from skilled PT services to improve her overall left LE muscle strength and improve her mobility to decrease her fall risk and improve her overall quality of life.   OBJECTIVE IMPAIRMENTS: Abnormal gait, decreased activity tolerance, decreased balance, decreased coordination, decreased endurance, decreased mobility, difficulty walking, and decreased strength.   ACTIVITY LIMITATIONS: carrying, lifting, bending, standing, squatting, stairs, and transfers  PARTICIPATION LIMITATIONS: cleaning, laundry, driving, shopping, community activity, and yard work  PERSONAL FACTORS: 3+ comorbidities: OA, HTN, hx of previous CVA  are also affecting patient's functional outcome.   REHAB POTENTIAL: Good  CLINICAL DECISION MAKING: Evolving/moderate complexity  EVALUATION COMPLEXITY: Moderate  PLAN:  PT FREQUENCY: 1-2x/week  PT DURATION: 12 weeks  PLANNED INTERVENTIONS: Therapeutic exercises, Therapeutic activity, Neuromuscular re-education, Balance training, Gait training, Patient/Family education, Self Care, Joint mobilization, Joint manipulation, Stair training, Vestibular training, Canalith repositioning, DME instructions, Dry  Needling, Electrical stimulation, Spinal manipulation, Spinal mobilization, Cryotherapy, Moist heat, Taping, and Manual therapy  PLAN FOR NEXT SESSION:   Progress gait training to include dual-cog tasks, BLE endurance & dynamic balance.    Lenda Kelp PT  [facility]  09/25/2022, 1:18 PM   Physical Therapist - [facility], [facility]  Outpatient Physical Therapy- Main Campus [phone]

## 2022-09-24 NOTE — Therapy (Unsigned)
OUTPATIENT OCCUPATIONAL THERAPY NEURO TREATMENT NOTE        Patient Name: Teresa [name] MRN: [id number] DOB:December 28, 1940, 82 y.o., female Today's Date: 05/23/2022  PCP: Dr. Aram Beecham REFERRING PROVIDER: Dr. Aram Beecham    Past Medical History:  Diagnosis Date   Cancer 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  Rationale for Evaluation and Treatment Rehabilitation  PERTINENT HISTORY: February 26, 2021, pt reports she had a CVA, came to [facility] to ER and then was transferred to [facility] in [place] where she was admitted and after acute care she went to inpatient rehab.  Following inpt rehab, pt went home and had home health.   PRECAUTIONS: fall  SUBJECTIVE: Pt reports she is working on crocheting baby blankets.   PAIN:  Are you having pain? none  OBJECTIVE:  L grip 6, R grip 41 L lateral pinch 5, R 13 L 3 point pinch 4, R 14  L 9 hole 1 min 43 sec. L shoulder active flexion 0-105, passive 0-125 02/05/22: L grip 13#  L lateral pinch 8#  3 point pinch 5#  L shoulder flex 0-108 03/12/22:  L grip: 12# L lateral pinch: 8# 3 point pinch: 6# L 9 hole: 1 min 3 sec L shoulder flexion 0-110 04/16/22: L grip: 3# L lateral  pinch: 4# 3 point pinch: difficulty maintaining 3 point pinch  L 9 hole: 5 pegs in 2 min 36 sec  L shoulder flexion 0-107 BP L 132/79 HR 80 02 sats 96% 04/25/22: L grip: 14#; R grip: 50# L lateral 2#, R lateral 17# 3 point pinch: L 4#, R 14# (Saehan pinch gauge) 9 hole peg test: 1 min, 48 sec L shoulder flex 125 (R shoulder 132 active) 05/21/22: L grip: 15#; R grip: 50# L lateral 8#, R lateral 20# 3 point pinch: L 4#, R 12# (Saehan pinch gauge) 9 hole peg test: 1 min, 23 sec 07/04/22: L grip: 19# L lateral 10# 3 point pinch: L 6# (standard pinch gauge-not Saehan) 9 hole peg test: 1 min, 14 sec 07/18/22:  L grip 22# L lateral: 9# L 3 point pinch: 6# L 9 hole: 1 min 8 sec 08/20/22: (Feeling sluggish since 08/16/22 after ED visit for medication adverse reaction) L grip: 21# L lateral: 7# L 3 point pinch: 7# (standard pinch gauge) L 9 hole: 4 pegs in 3 min (OT stopped at 3 min).  08/23/22:  L 9 hole: 1 min 12 sec 09/24/22:  L grip: 21# L lateral: 10# L 3 point pinch: 7# (standard pinch gauge) L 9 hole: 1 min 5 sec   TODAY'S TREATMENT: Therapeutic Exercise:  Pt completed 1.5lb digi-flex for L finger  strengthening, cues for digit isolation. Pt snapped and unsnapped snap beads with emphasis on finger strengthening. Facilitated pinch strengthening with use of therapy resistant clothespins to target lateral and 3 point pinch of L hand.  Pt managed all colors including black, most resistive pins, clipping on/off a vertical dowel.   Therapeutic Activity: Facilitated [facility] with the L hand and L attention while flipping cards. Pt sorted by colors then by suits working on progressively increasing the speed while maintaining control. Sorted deck in numerical order with use of L hand only to spread and arrange cards.   PATIENT EDUCATION: Education details: L hand FMC/dexterity skills with reduced compensatory movements Person educated: pt Education method: explanation, vc, tactile cues,  demo Education comprehension: verbalized understanding, further training needed  HOME EXERCISE PROGRAM Continue to engage LUE into ADLs; continue use of putty for gripping and pinching exercises for LUE, and L shoulder AROM/AAROM, crocheting, typing; increase participation in IADL tasks for greater use of L hand.   OT Short Term Goals - 08/29/22 (6 weeks)      OT SHORT TERM GOAL #1   Title Pt will be independent with home exercise program.    Baseline Eval: no current program, 10th visit:  continue to add new exercises as pt progresses, 20th:  continue to update HEP; 08/17/21: continue to progress HEP when indicated.  Visit 40: adding new exercises as pt progresses; 12/25/21: ongoing with progressions; 03/12/22: inconsistent use of putty but regular use of stress ball (every other day).  Encouraged pt return to daily putty use (2x daily when able) to target grip and pinch strengthening; 04/16/22: not reviewed today d/t OT sent pt to ED; 04/25/22: added pulleys and pt has re-started putty; 05/21/22: stopped pulleys and added stronger theraputty and encouraged pt crochet often; 07/04/22: pt working consistently with putty and grip strengthener at 20# at home; 07/18/22: pt continues to work on putty and Building surveyor and will be starting with her crocheting again to progress [facility] skills; 08/20/22: Pt continues to use her grip strengthener daily set at 20#; will continue to modify as needed   Time 12    Period Weeks    Status On-going    Target Date 08/29/22     OT LONG TERM GOAL #2   Title Pt will complete UB and LB dressing with modified independence including buttons, snaps and zippers.    Baseline requires min assist at eval, 10th visit: occasional assist with buttons, 20th:  able to perform one handed, but difficulty with bilateral UE; 08/17/21: pt reports inconsistent with 1 hand, reviewed techniques this visit, Visit 40:  Pt requires assist with bra   Time 12    Period Weeks    Status MET   Target  Date 11/08/21     OT LONG TERM GOAL #3   Title Pt will perform shower transfer with modified independence.    Baseline Pt requires supervision to min assist for shower transfer at home. 10th visit: supervision; 08/17/21: supv .  Visit 40:  Pt had met goal but had recent fall and now requiring min guard to supv   Time 6    Period Weeks    Status  Met    Target Date 11/08/21      OT LONG TERM GOAL #4 10/10/22 (12 weeks)    Title Pt will improve L hand grip to 25 or more #s to enable pt to open a new jar.   Baseline no grip in left hand at eval, 10th  visit:  improved flexion but still working towards composite fisting and grip. 20th:  continues to demo decreased grip; 08/17/21: active digit flexion improving, but not yet able to register grip on dynamometer; 09/04/21: L grip 1# 7/17:  5#; 12/25/21: L grip 6#; 02/05/22: 13#; 03/12/22: L grip 12#; 1/23//24: L grip 3# (decreased); 04/25/22: L grip 14#; spouse assists to open new jars; 05/21/22: L grip 15#; 07/04/22: L grip 19#; 07/18/22: L grip 22#, difficulty opening a new jar (R grip 50#); 08/20/22: L grip 21# (increased weakness since Friday after adverse reaction to a new medication resulting in ED visit); 09/24/22: pt reports she can consistently open small jars (ie relish or apple sauce) using dycem.   Time 12    Period Weeks    Status Revised/ongoing   Target Date 10/10/22     OT LONG TERM GOAL #5   Title Pt will improve left shoulder flexion to 100 degrees or better to improve reaching to obtain self care items from shelf/shoulder height.    Baseline difficulty with reach, shoulder flexion to 47 degrees; 08/17/21: L shoulder flexion 85, but not yet able to consistently hold ADL supplies in L hand when reaching; 09/04/21: flexion 85 7/17:  shoulder flexion to 90; 12/25/21: 105 (P 125); 03/12/22: active L shoulder flexion 110 (pt uses R arm to reach for items at shoulder height or above); 04/16/22: L shoulder flexion 107; 04/25/22: L shoulder flexion 125*, R 132*     Time 12    Period Weeks    Status achieved   Target Date 04/25/22     OT LONG TERM GOAL #6   Title Pt will improve FOTO score to 47 or above to demonstrate a clinically relevant change in function to impact ADL tasks.    Baseline score of 30 at eval; 08/17/21: FOTO: 42; 09/04/21: FOTO : 54 , FOTO 7/7:  60; 12/25/21: 59; 03/12/22: 66; 04/16/22: 67; 04/25/22; 51; 05/21/22: 74; 07/04/22: 74; 07/18/22: 71; 08/20/22: 77   Time 12    Period Weeks    Status  MET/ongoing    Target Date 10/10/22     OT LONG TERM GOAL #7   Title Pt will demonstrate ability to pick up small objects with L hand and complete 9 hole peg test in less than 1 min (revised)   Baseline unable to perform at eval, 10th visit: still unable to pick up small pegs, 20th: unable to pick up pegs but improving; 08/17/21: not attempted d/t time constraints, will assess next visit; 09/04/21: 9 hole peg test in 5 min 53 sec;  7/17:  Pt unable to complete this date but was able to place 6 of 9 pegs in 4 mins 20 secs; 12/25/21: L 1 min 43 sec; 03/12/22: L 1 min 2 sec (requires non-skid surface to pick up small items from table top); 04/16/22: 5 pegs in 2 min 36 secs; 04/25/22: 1 min 48 sec; 05/21/22: L 1 min 23 sec; 07/04/22: 1 min 14 sec; 08/20/22: Able to complete 4 pegs in 3 min before OT stopped test (declined LUE motor skills since ED visit Friday 08/16/22 d/t adverse reaction to a new med)   Time 12    Period Weeks    Status Revised/On-going    Target Date 10/10/22    OT LONG TERM GOAL #8   Title Pt will increase LUE strength by 1 MM grade in order to hold blow dryer in LUE to dry hair without dropping dryer.   Baseline 04/25/22: Unable to  sustain grip and maintain lift of LUE with hand near head without dropping dryer.  Dryer has caused bruising above L eye with previous attempts.  L shoulder flex/abd 3/5, elbow flex/ext 3+/5; 07/04/22: L shoulder flex/abd 3+, elbow and wrist flex/ext 4+; 09/24/22: L shoulder flex/abd 4-, elbow flex 5/5, elbow ext 4+, wrist  flex/ext 4+; Pt reports she hasn't dropped the blow dryer in months   Time 12    Period Weeks    Status achieved   Target Date 10/10/22        OT LONG TERM GOAL #9   Title Pt will increase L hand dexterity to enable pt to independently manage clothing fasteners.   Baseline 04/25/22: pt ties shoe laces loosely and requires assist from spouse occasionally.  When typing, pt engages the L hand for ~25% of the task.  Pt avoids wearing jeans d/t inability to manage button or zipper; 07/04/22: no change yet from 04/25/22; 07/18/22: Managed zipper today, but increased time and effort to manage buttons on pants; able to tie shoes loosely with increased time; 08/20/23: better efficiency with managing zipper on a jacket; pt manages buttons 1 handed (R); able to tie shoes without laces being loose; 09/24/22: able to hook bra consistently in the front, managing buttons 2 handed, managing zippers 2 handed, tying shoes more snug.    Time 12    Period Weeks    Status Achieved    Target Date 10/10/22        OT LONG TERM GOAL #10   Title Pt will be able to independently carry a light plate of food or drink in L hand without spilling/dropping to increase efficiency with item transport in the kitchen.   Baseline 04/25/22: Pt requires constant cues to keep cup or plate level while multitasking in order to prevent spilling; 07/04/22: Pt reports she is carrying her coffee cup from kitchen to neighboring room (not yet consistent to avoid spills); 08/20/22: Pt has not been using L hand to carry items much since Friday d/t increased weakness since ED visit; 09/24/22: able to manage when looking at hand but consistently drops items when distracted/dual tasking   Time 12    Period Weeks    Status ongoing   Target Date 10/10/22     Plan -     Clinical Impression Statement Pt continues to make gains with L hand FMC/dexterity skills. Pt completes all clips using lateral and 3 point pinch grip, including black clips. Focused on  dexterity sorting cards using L hand, emphasis on speed with difficulty maintaining control. Unable to shuffle cards. Pt will continue to work towards goals in plan of care to improve left UE functional use in ADL and IADL tasks at home and in the community.    OT Occupational Profile and History Detailed Assessment- Review of Records and additional review of physical, cognitive, psychosocial history related to current functional performance    Occupational performance deficits (Please refer to evaluation for details): ADL's;IADL's;Leisure;Rest and Sleep    Body Structure / Function / Physical Skills ADL;Coordination;Endurance;GMC;UE functional use;Balance;IADL;Pain;Dexterity;FMC;Strength;Edema;Mobility;ROM    Psychosocial Skills Environmental  Adaptations;Habits;Routines and Behaviors    Rehab Potential Good    Clinical Decision Making Several treatment options, min-mod task modification necessary   Comorbidities Affecting Occupational Performance: May have comorbidities impacting occupational performance    Modification or Assistance to Complete Evaluation  Min-Moderate modification of tasks or assist with assess necessary to complete eval    OT Frequency 2x / week  OT Duration 12 weeks    OT Treatment/Interventions Self-care/ADL training;Cryotherapy;Paraffin;Therapeutic exercise;DME and/or AE instruction;Functional Mobility Training;Balance training;Electrical Stimulation;Ultrasound;Neuromuscular education;Manual Therapy;Splinting;Moist Heat;Contrast Bath;Passive range of motion;Therapeutic activities;Patient/family education;Coping strategies training    Plan OT recert    Consulted and Agree with Plan of Care Patient             Otis Dials, OT 09/24/2022, 11:07 AM

## 2022-09-27 ENCOUNTER — Ambulatory Visit: Payer: Medicare HMO

## 2022-09-27 DIAGNOSIS — M6281 Muscle weakness (generalized): Secondary | ICD-10-CM

## 2022-09-27 DIAGNOSIS — R2681 Unsteadiness on feet: Secondary | ICD-10-CM | POA: Diagnosis not present

## 2022-09-27 DIAGNOSIS — R278 Other lack of coordination: Secondary | ICD-10-CM

## 2022-09-27 DIAGNOSIS — I63511 Cerebral infarction due to unspecified occlusion or stenosis of right middle cerebral artery: Secondary | ICD-10-CM

## 2022-09-27 DIAGNOSIS — R262 Difficulty in walking, not elsewhere classified: Secondary | ICD-10-CM | POA: Diagnosis not present

## 2022-09-27 DIAGNOSIS — R2689 Other abnormalities of gait and mobility: Secondary | ICD-10-CM | POA: Diagnosis not present

## 2022-09-27 NOTE — Therapy (Signed)
OUTPATIENT PHYSICAL THERAPY NEURO TREATMENT/Physical Therapy Progress Note   Dates of reporting period  08/23/2022   to   09/27/2022   Patient Name: Teresa [name] MRN: [id number] DOB:12/02/1940, 82 y.o., female Today's Date: 09/27/2022   PCP: Dr. Aram Beecham REFERRING PROVIDER: Dr. Aram Beecham  END OF SESSION:  PT End of Session - 09/27/22 0936     Visit Number 30    Number of Visits 41    Date for PT Re-Evaluation 11/05/22    Authorization Type [employer] PPO-    Authorization Time Period 05/23/2022- 08/15/2022    Progress Note Due on Visit 40    PT Start Time 0932    Equipment Utilized During Treatment Gait belt    Activity Tolerance Patient tolerated treatment well    Behavior During Therapy [facility] for tasks assessed/performed                              Past Medical History:  Diagnosis Date   Cancer (HCC)    skin   Hypothyroidism    Raynaud's disease    Stroke [facility])    Past Surgical History:  Procedure Laterality Date   ABDOMINAL HYSTERECTOMY  1991   APPENDECTOMY  1991   BACK SURGERY  2010   Cervical fusion C4-5-6-7   CATARACT EXTRACTION, BILATERAL Bilateral 10/2016   CHOLECYSTECTOMY  1995   COLONOSCOPY WITH PROPOFOL N/A 12/08/2018   Procedure: COLONOSCOPY WITH PROPOFOL;  Surgeon: Toledo, Boykin Nearing, MD;  Location: [facility] ENDOSCOPY;  Service: Gastroenterology;  Laterality: N/A;   EYE SURGERY     Patient Active Problem List   Diagnosis Date Noted   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   Stroke (HCC) 02/27/2021   History of nonmelanoma skin cancer 05/23/2014    ONSET DATE: 04/16/2022  REFERRING DIAG: 69.354 (ICD-10-CM) - Hemiplegia and hemiparesis following cerebral infarction affecting left non-dominant side   THERAPY DIAG:  Unsteadiness on feet  Difficulty in walking, not elsewhere classified  Other lack of coordination  Other abnormalities of gait and mobility  Muscle weakness (generalized)  Right  middle cerebral artery stroke (HCC)  Rationale for Evaluation and Treatment: Rehabilitation  SUBJECTIVE:                                                                                                                                                                                             SUBJECTIVE STATEMENT: Patient reports doing well and only complaint is cold hands    Pt accompanied by: self  PERTINENT HISTORY: Pt is a 82 y.o. female with referral  for hemiplegia with left sided weakness -original CVA on 02/26/21 and new onset of left sided weakness on 04/16/2022.  R MCA CVA on 02/26/21. Pt completed outpatient PT on 01/17/2022 with majority of goals met. PMH includes: skin cancer, hypothyroidism, and Raynaud's disease, history of cervical fusion in 2010 C4-C7.   PAIN:  Are you having pain?  no  PRECAUTIONS: Fall  WEIGHT BEARING RESTRICTIONS: No  FALLS: Has patient fallen in last 6 months? No  LIVING ENVIRONMENT: Lives with: lives with their spouse Lives in: House/apartment Stairs: Yes: External: 1 steps; none Has following equipment at home: Single point cane, Walker - 2 wheeled, shower chair, and Grab bars  PLOF: Independent  PATIENT GOALS: I want to walk without this cane and no falls and enjoy going out to grandkids sporting event  OBJECTIVE:   DIAGNOSTIC FINDINGS:   5/24:  EXAM: CT HEAD WITHOUT CONTRAST  IMPRESSION: 1. No evidence of acute intracranial abnormality. 2. Remote right MCA territory infarct.     CLINICAL DATA:  Stroke suspected   EXAM: MRI HEAD WITHOUT CONTRAST   TECHNIQUE: Multiplanar, multiecho pulse sequences of the brain and surrounding structures were obtained without intravenous contrast.   COMPARISON:  MRI Head 02/26/21, CT Head 04/16/22   FINDINGS: Brain: Evolving right MCA territory infarct involving the right frontal operculum in the right insular cortex. No acute infarction, hemorrhage, hydrocephalus, extra-axial collection or  mass lesion. Small focus of microhemorrhage in the left temporal lobe. There is petechial hemorrhage and/or hemosiderosis in the region of prior right MCA territory infarct. T2/stir hyperintense signal in the right cerebral peduncle is favored to represent transneuronal degeneration.   Vascular: Major flow voids are preserved.   Skull and upper cervical spine: Normal marrow signal.   Sinuses/Orbits: Bilateral lens replacement. No mastoid or middle ear effusion. Paranasal sinuses are clear.   Other: None.   IMPRESSION: 1. No acute intracranial abnormality. 2. 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 3.     Electronically Signed   By: Lorenza Cambridge M.D.   On: 04/16/2022 13:41  COGNITION: Overall cognitive status: Within functional limits for tasks assessed   SENSATION: WFL  COORDINATION: Intact with each LE  EDEMA:  None observed  MUSCLE TONE: LLE: Within functional limits  DTRs:  Patella 2+ = Normal and Achilles 2+ = Normal  POSTURE: rounded shoulders and forward head  LOWER EXTREMITY ROM:     Active  Right Eval Left Eval  Hip flexion WNL THROUGHOUT LE WNL THROUGHOUT LE  Hip extension    Hip abduction    Hip adduction    Hip internal rotation    Hip external rotation    Knee flexion    Knee extension    Ankle dorsiflexion    Ankle plantarflexion    Ankle inversion    Ankle eversion     (Blank rows = not tested)  LOWER EXTREMITY MMT:    MMT Right Eval Left Eval  Hip flexion 4+ 4  Hip extension 4+ 4  Hip abduction 4+ 4  Hip adduction 4+ 4  Hip internal rotation 4+ 4  Hip external rotation 4+ 4  Knee flexion 4+ 4  Knee extension 5 4  Ankle dorsiflexion 5 4  Ankle plantarflexion    Ankle inversion    Ankle eversion    (Blank rows = not tested)  BED MOBILITY:  Patient reports independent with all bed mobility and states has returned to master bed  TRANSFERS: Assistive  device  utilized: None  Sit to stand: Complete Independence Stand to sit: Complete Independence Chair to chair: Complete Independence Floor:  Not tested   GAIT: Gait pattern: step through pattern, decreased arm swing- Right, decreased arm swing- Left, decreased step length- Right, and decreased step length- Left Distance walked: 915 feet Assistive device utilized: None Level of assistance: SBA Comments: occasional scuff or decreased left foot clearance  FUNCTIONAL TESTS:  5 times sit to stand: 23.62 sec without UE support Timed up and go (TUG): 20.14 sec without an AD 10 meter walk test: 0.70 m/s  PATIENT SURVEYS:  FOTO 60/ goal of 66  TODAY'S TREATMENT:                                                                                                                              DATE: 09/27/2022   Gait belt donned and CGA provided throughout unless specified otherwise    Physical therapy treatment session today consisted of completing assessment of goals and administration of testing as demonstrated and documented in flow sheet, treatment, and goals section of this note. Addition treatments may be found below.   Assessed FGA: 19/30  NMR:   Cone work: walking around 7 cones- forward/backward x 4 Cone work: Walk to squat down and left UE to pick up cone x 7 and place back down x 2 trials Cone work: Side stepping over cones x 7 - 2 trials down and back Cone work: Forward stepping over cones down and back x 2   High knee march x 30 feet and back x 2 CGA- down and back x 4 Walking ham curls- CGA- down and back 30 feet x 2    Tandem standing at support bar x 30 sec- mulitple attempts to attain 30 sec hold each LE       PATIENT EDUCATION: Education details: Pt educated throughout session about proper posture and technique with exercises. Improved exercise technique, movement at target joints, use of target muscles after min to mod verbal, visual, tactile cues.  Person educated:  Patient Education method: Explanation, Demonstration, Tactile cues, and Verbal cues Education comprehension: verbalized understanding, returned demonstration, verbal cues required, tactile cues required, and needs further education  HOME EXERCISE PROGRAM: To be updated next 1-2 visit  GOALS: Goals reviewed with patient? Yes  SHORT TERM GOALS: Target date: 07/04/2022  Patient will be independent in home exercise program to improve strength/mobility for better functional independence with ADLs  Baseline: EVAL: Patient reports mostly just walking; 07/09/22: Pt reports her exercises are going well, to be updated future date Goal status: IN PROGRESS  LONG TERM GOALS: Target date: 11/05/2022    Pt will decrease 5TSTS by at least 6 seconds in order to demonstrate clinically significant improvement in LE strength. Baseline: EVAL= 23.62 sec without UE; 07/09/22:  13 seconds hands-free  Goal status: MET  2. Pt will improve FOTO to target score of 66 to display perceived improvements in ability  to complete ADL's.  Baseline: Eval= 60; 07/09/22: 65 5/21: 59% Goal status: PARTIALLY MET  3.  Pt will decrease TUG to below 14 seconds/decrease in order to demonstrate decreased fall risk. Baseline: Eval= 20.14 sec without AD; 07/09/22: 11 seconds no AD Goal status: MET  4.  Patient will increase six minute walk test distance to >1200 ft for improve gait ability and return to PLOF to complete her walks in her neighborhood.  Baseline: EVAL= 915 feet without an AD; 07/09/22: 1184 ft without AD 5/21: 1060 ft  Goal status: Partially MET  5. Pt will increase by at least 0.13 m/s in order to demonstrate clinically significant improvement in community ambulation.    Baseline: Eval = 0.70 m/s; 07/09/2022: 1.04 m/s without AD   Goal status: MET   6. Pt will report that she has not hit LLE/LUE on doorframe/walls/furniture while ambulating in the past two weeks to indicate improved ability to scan environment  and to decrease injury risk with gait.  Baseline: currently has hit furniture/doorframe 2x/week 5/21: hitting doorframe less frequently. 08/23/2022- Patient reports no bumping arms into any walls or furniture since last tested on 08/13/2022. 09/27/2022= Patient reports no bumping arms into any walls or furniture since last tested on 08/23/2022.   Goal status: MET  7. Patient will increase Functional Gait Assessment score to >20/30 as to reduce fall risk and improve dynamic gait safety with community ambulation.  Baseline:5/21: 13/30; 09/27/2022= 19/30   Goal status: PROGRESSING   ASSESSMENT:  CLINICAL IMPRESSION: Patient presents with excellent motivation for today's progress note. She continues to work on plan of care - mostly balance and her balance goals was reassessed today. She demonstrated signicant progress with FGA and much improved dynamic balance exhibited during testing today. Despite these gains she continues to present with risk of falling and patient's condition has the potential to improve in response to therapy. Maximum improvement is yet to be obtained. The anticipated improvement is attainable and reasonable in a generally predictable time. The pt will benefit from skilled PT services to improve her overall left LE muscle strength and improve her mobility to decrease her fall risk and improve her overall quality of life.   OBJECTIVE IMPAIRMENTS: Abnormal gait, decreased activity tolerance, decreased balance, decreased coordination, decreased endurance, decreased mobility, difficulty walking, and decreased strength.   ACTIVITY LIMITATIONS: carrying, lifting, bending, standing, squatting, stairs, and transfers  PARTICIPATION LIMITATIONS: cleaning, laundry, driving, shopping, community activity, and yard work  PERSONAL FACTORS: 3+ comorbidities: OA, HTN, hx of previous CVA  are also affecting patient's functional outcome.   REHAB POTENTIAL: Good  CLINICAL DECISION MAKING:  Evolving/moderate complexity  EVALUATION COMPLEXITY: Moderate  PLAN:  PT FREQUENCY: 1-2x/week  PT DURATION: 12 weeks  PLANNED INTERVENTIONS: Therapeutic exercises, Therapeutic activity, Neuromuscular re-education, Balance training, Gait training, Patient/Family education, Self Care, Joint mobilization, Joint manipulation, Stair training, Vestibular training, Canalith repositioning, DME instructions, Dry Needling, Electrical stimulation, Spinal manipulation, Spinal mobilization, Cryotherapy, Moist heat, Taping, and Manual therapy  PLAN FOR NEXT SESSION:   Progress gait training to include dual-cog tasks, BLE endurance & dynamic balance.    Lenda Kelp PT  [facility]  09/27/2022, 9:38 AM   Physical Therapist - [facility]  Outpatient Physical Therapy- Main Campus [phone]

## 2022-09-29 NOTE — Therapy (Signed)
OUTPATIENT OCCUPATIONAL THERAPY NEURO TREATMENT NOTE        Patient Name: Teresa [name] MRN: [id number] DOB:November 30, 1940, 82 y.o., female Today's Date: 05/23/2022  PCP: Dr. Aram Beecham REFERRING PROVIDER: Dr. Aram Beecham   OT End of Session - 09/29/22 2117     Visit Number 131    Number of Visits 138    Date for OT Re-Evaluation 10/10/22    Authorization Time Period Reporting period beginning 09/24/22    Progress Note Due on Visit 10    OT Start Time 1015    OT Stop Time 1100    OT Time Calculation (min) 45 min    Equipment Utilized During Treatment cane    Activity Tolerance Patient tolerated treatment well    Behavior During Therapy WFL for tasks assessed/performed             Past Medical History:  Diagnosis Date   Cancer (HCC)    skin   Hypothyroidism    Raynaud's disease    Stroke [facility])    Past Surgical History:  Procedure Laterality Date   ABDOMINAL HYSTERECTOMY  1991   APPENDECTOMY  1991   BACK SURGERY  2010   Cervical fusion C4-5-6-7   CATARACT EXTRACTION, BILATERAL Bilateral 10/2016   CHOLECYSTECTOMY  1995   COLONOSCOPY WITH PROPOFOL N/A 12/08/2018   Procedure: COLONOSCOPY WITH PROPOFOL;  Surgeon: Toledo, Boykin Nearing, MD;  Location: [facility] ENDOSCOPY;  Service: Gastroenterology;  Laterality: N/A;   EYE SURGERY     Patient Active Problem List   Diagnosis Date Noted   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  Following inpt  rehab, pt went home and had home health.   PRECAUTIONS: fall  SUBJECTIVE: Pt reports her DIL's father passed away this morning so she may be out of town next week to travel for a funeral out of state.  PAIN:  Are you having pain? none  OBJECTIVE:  L grip 6, R grip 41 L lateral pinch 5, R 13 L 3 point pinch 4, R 14  L 9 hole 1 min 43 sec. L shoulder active flexion 0-105, passive 0-125 02/05/22: L grip 13#  L lateral pinch 8#  3 point pinch 5#  L shoulder flex 0-108 03/12/22:  L grip: 12# L lateral pinch: 8# 3 point pinch: 6# L 9 hole: 1 min 3 sec L shoulder flexion 0-110 04/16/22: L grip: 3# L lateral pinch: 4# 3 point pinch: difficulty maintaining 3 point pinch  L 9 hole: 5 pegs in 2 min 36 sec  L shoulder flexion 0-107 BP L 132/79 HR 80 02 sats 96% 04/25/22: L grip: 14#; R grip: 50# L lateral 2#, R lateral 17# 3 point pinch: L 4#, R 14# (Saehan pinch gauge) 9 hole peg test: 1 min, 48 sec L shoulder flex 125 (R shoulder 132 active) 05/21/22: L grip: 15#; R grip: 50# L lateral 8#, R lateral 20# 3 point pinch: L 4#, R 12# (Saehan pinch gauge) 9  hole peg test: 1 min, 23 sec 07/04/22: L grip: 19# L lateral 10# 3 point pinch: L 6# (standard pinch gauge-not Saehan) 9 hole peg test: 1 min, 14 sec 07/18/22:  L grip 22# L lateral: 9# L 3 point pinch: 6# L 9 hole: 1 min 8 sec 08/20/22: (Feeling sluggish since 08/16/22 after ED visit for medication adverse reaction) L grip: 21# L lateral: 7# L 3 point pinch: 7# (standard pinch gauge) L 9 hole: 4 pegs in 3 min (OT stopped at 3 min).  08/23/22:  L 9 hole: 1 min 12 sec 09/24/22:  L grip: 21# L lateral: 10# L 3 point pinch: 7# (standard pinch gauge) L 9 hole: 1 min 5 sec   TODAY'S TREATMENT  Neuro re-ed:  Facilitated L hand FMC/dexterity skills working to pick up and place Jamar pegs into pegboard.  Pt practiced storage and   translatory movements working to store 3-5 pegs in palm before discarding them from hand.      Self Care: Pt reported having trouble with keeping her yard from slipping off her L IF when she crochets.  Participated in activity break down and activity modification with initial recommendation for a Coban wrap around her finger to use as a non-skid surface.  Trialed piece of pre-wrap around IF and pt felt this may work better so that the yarn is anchored but not stuck on her finger.  Issued several pieces of pre-wrap and pt will plan to try this at home over the weekend.  Reinforced making sure the pre-wrap is lightly wrapped to avoid disruption to circulation in the finger.  Pt verbalized understanding.   PATIENT EDUCATION: Education details: activity modification with crocheting Person educated: pt Education method: explanation Education comprehension: verbalized understanding  HOME EXERCISE PROGRAM Continue to engage LUE into ADLs; continue use of putty for gripping and pinching exercises for LUE, and L shoulder AROM/AAROM, crocheting, typing; increase participation in IADL tasks for greater use of L hand.   OT Short Term Goals - 08/29/22 (6 weeks)      OT SHORT TERM GOAL #1   Title Pt will be independent with home exercise program.    Baseline Eval: no current program, 10th visit:  continue to add new exercises as pt progresses, 20th:  continue to update HEP; 08/17/21: continue to progress HEP when indicated.  Visit 40: adding new exercises as pt progresses; 12/25/21: ongoing with progressions; 03/12/22: inconsistent use of putty but regular use of stress ball (every other day).  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    Baseline requires min assist at eval, 10th visit: occasional  assist with buttons, 20th:  able to perform one handed, but difficulty with bilateral UE; 08/17/21: pt reports inconsistent with 1 hand, reviewed techniques this visit, Visit 40:  Pt requires assist with bra   Time 12    Period Weeks    Status MET   Target Date 11/08/21     OT LONG TERM GOAL #3   Title Pt will perform shower transfer with modified independence.    Baseline Pt requires supervision to min assist for shower transfer at home. 10th visit: supervision; 08/17/21: supv .  Visit 40:  Pt had met goal but had recent fall and now requiring min guard to supv   Time 6    Period Weeks    Status  Met    Target Date 11/08/21      OT LONG TERM GOAL #4 10/10/22 (12 weeks)    Title Pt will improve L hand grip to 25 or more #s to enable pt to open a new jar.   Baseline no grip in left hand at eval, 10th visit:  improved flexion but still working towards composite fisting and grip. 20th:  continues to demo decreased grip; 08/17/21: active digit flexion improving, but not yet able to register grip on dynamometer; 09/04/21: L grip 1# 7/17:  5#; 12/25/21: L grip 6#; 02/05/22: 13#; 03/12/22: L grip 12#; 1/23//24: L grip 3# (decreased); 04/25/22: L grip 14#; spouse assists to open new jars; 05/21/22: L grip 15#; 07/04/22: L grip 19#; 07/18/22: L grip 22#, difficulty opening a new jar (R grip 50#); 08/20/22: L grip 21# (increased weakness since Friday after adverse reaction to a new medication  resulting in ED visit); 09/24/22: L grip 21#, pt reports she can consistently open small jars (ie relish or apple sauce) using dycem.   Time 12    Period Weeks    Status ongoing   Target Date 10/10/22     OT LONG TERM GOAL #5   Title Pt will improve left shoulder flexion to 100 degrees or better to improve reaching to obtain self care items from shelf/shoulder height.    Baseline difficulty with reach, shoulder flexion to 47 degrees; 08/17/21: L shoulder flexion 85, but not yet able to consistently hold ADL supplies in L hand when reaching; 09/04/21: flexion 85 7/17:  shoulder flexion to 90; 12/25/21: 105 (P 125); 03/12/22: active L shoulder flexion 110 (pt uses R arm to reach for items at shoulder height or above); 04/16/22: L shoulder flexion 107; 04/25/22: L shoulder flexion 125*, R 132*    Time 12    Period Weeks    Status achieved   Target Date 04/25/22     OT LONG TERM GOAL #6   Title Pt will improve FOTO score to 47 or above to demonstrate a clinically relevant change in function to impact ADL tasks.    Baseline score of 30 at eval; 08/17/21: FOTO: 42; 09/04/21: FOTO : 54 , FOTO 7/7:  60; 12/25/21: 59; 03/12/22: 66; 04/16/22: 67; 04/25/22; 51; 05/21/22: 74; 07/04/22: 74; 07/18/22: 71; 08/20/22: 77; 09/24/22: 71   Time 12    Period Weeks    Status  MET/ongoing    Target Date 10/10/22     OT LONG TERM GOAL #7   Title Pt will demonstrate ability to pick up small objects with L hand and complete 9 hole peg test in less than 1 min (revised)   Baseline unable to perform at eval, 10th visit: still unable to pick up small pegs, 20th:  unable to pick up pegs but improving; 08/17/21: not attempted d/t time constraints, will assess next visit; 09/04/21: 9 hole peg test in 5 min 53 sec;  7/17:  Pt unable to complete this date but was able to place 6 of 9 pegs in 4 mins 20 secs; 12/25/21: L 1 min 43 sec; 03/12/22: L 1 min 2 sec (requires non-skid surface to pick up small items from table top); 04/16/22: 5 pegs in 2 min 36  secs; 04/25/22: 1 min 48 sec; 05/21/22: L 1 min 23 sec; 07/04/22: 1 min 14 sec; 08/20/22: Able to complete 4 pegs in 3 min before OT stopped test (declined LUE motor skills since ED visit Friday 08/16/22 d/t adverse reaction to a new med); 09/24/22: 1 min 5 sec   Time 12    Period Weeks    Status Revised/On-going    Target Date 10/10/22    OT LONG TERM GOAL #8   Title Pt will increase LUE strength by 1 MM grade in order to hold blow dryer in LUE to dry hair without dropping dryer.   Baseline 04/25/22: Unable to sustain grip and maintain lift of LUE with hand near head without dropping dryer.  Dryer has caused bruising above L eye with previous attempts.  L shoulder flex/abd 3/5, elbow flex/ext 3+/5; 07/04/22: L shoulder flex/abd 3+, elbow and wrist flex/ext 4+; 09/24/22: L shoulder flex/abd 4-, elbow flex 5/5, elbow ext 4+, wrist flex/ext 4+; Pt reports she hasn't dropped the blow dryer in months   Time 12    Period Weeks    Status achieved   Target Date 10/10/22        OT LONG TERM GOAL #9   Title Pt will increase L hand dexterity to enable pt to independently manage clothing fasteners.   Baseline 04/25/22: pt ties shoe laces loosely and requires assist from spouse occasionally.  When typing, pt engages the L hand for ~25% of the task.  Pt avoids wearing jeans d/t inability to manage button or zipper; 07/04/22: no change yet from 04/25/22; 07/18/22: Managed zipper today, but increased time and effort to manage buttons on pants; able to tie shoes loosely with increased time; 08/20/23: better efficiency with managing zipper on a jacket; pt manages buttons 1 handed (R); able to tie shoes without laces being loose; 09/24/22: able to hook bra consistently in the front, managing buttons 2 handed, managing zippers 2 handed, tying shoes more snug.    Time 12    Period Weeks    Status Achieved    Target Date 10/10/22        OT LONG TERM GOAL #10   Title Pt will be able to independently carry a light plate of food or  drink in L hand without spilling/dropping to increase efficiency with item transport in the kitchen.   Baseline 04/25/22: Pt requires constant cues to keep cup or plate level while multitasking in order to prevent spilling; 07/04/22: Pt reports she is carrying her coffee cup from kitchen to neighboring room (not yet consistent to avoid spills); 08/20/22: Pt has not been using L hand to carry items much since Friday d/t increased weakness since ED visit; 09/24/22: able to manage when looking at hand but consistently drops items when distracted/dual tasking   Time 12    Period Weeks    Status ongoing   Target Date 10/10/22     Plan -     Clinical Impression Statement Pt continues to focus on L hand FMC/dexterity  skills and was able to pick up and place Jamar pegs this date with extra time and trials.  Pt was able to store 3-5 in palm with cues for tightening grip at the 4th and 5th digits to keep pegs from slipping from ulnar side of the hand.  Pt continues to work with her crocheting nightly, but reports the yard often slips off her L IF.  Activity modifications reviewed and pt will plan to trial pre-wrap around this finger to provide a non-skid surface for her yarn without the yarn sticking.  Pt will continue to work towards goals in plan of care to improve left UE functional use in ADL and IADL tasks at home and in the community.    OT Occupational Profile and History Detailed Assessment- Review of Records and additional review of physical, cognitive, psychosocial history related to current functional performance    Occupational performance deficits (Please refer to evaluation for details): ADL's;IADL's;Leisure;Rest and Sleep    Body Structure / Function / Physical Skills ADL;Coordination;Endurance;GMC;UE functional use;Balance;IADL;Pain;Dexterity;FMC;Strength;Edema;Mobility;ROM    Psychosocial Skills Environmental  Adaptations;Habits;Routines and Behaviors    Rehab Potential Good    Clinical Decision  Making Several treatment options, min-mod task modification necessary   Comorbidities Affecting Occupational Performance: May have comorbidities impacting occupational performance    Modification or Assistance to Complete Evaluation  Min-Moderate modification of tasks or assist with assess necessary to complete eval    OT Frequency 2x / week    OT Duration 12 weeks    OT Treatment/Interventions Self-care/ADL training;Cryotherapy;Paraffin;Therapeutic exercise;DME and/or AE instruction;Functional Mobility Training;Balance training;Electrical Stimulation;Ultrasound;Neuromuscular education;Manual Therapy;Splinting;Moist Heat;Contrast Bath;Passive range of motion;Therapeutic activities;Patient/family education;Coping strategies training    Plan OT recert    Consulted and Agree with Plan of Care Patient             Otis Dials, OT 09/29/2022, 9:19 PM

## 2022-09-30 NOTE — Progress Notes (Signed)
Carelink Summary Report / Loop Recorder 

## 2022-10-01 ENCOUNTER — Ambulatory Visit: Payer: Medicare PPO

## 2022-10-01 ENCOUNTER — Ambulatory Visit: Payer: Medicare HMO

## 2022-10-03 ENCOUNTER — Ambulatory Visit: Payer: Medicare HMO

## 2022-10-07 ENCOUNTER — Ambulatory Visit (INDEPENDENT_AMBULATORY_CARE_PROVIDER_SITE_OTHER): Payer: Medicare HMO

## 2022-10-07 DIAGNOSIS — I639 Cerebral infarction, unspecified: Secondary | ICD-10-CM

## 2022-10-08 ENCOUNTER — Ambulatory Visit: Payer: Medicare HMO

## 2022-10-08 DIAGNOSIS — R2689 Other abnormalities of gait and mobility: Secondary | ICD-10-CM

## 2022-10-08 DIAGNOSIS — M6281 Muscle weakness (generalized): Secondary | ICD-10-CM

## 2022-10-08 DIAGNOSIS — I63511 Cerebral infarction due to unspecified occlusion or stenosis of right middle cerebral artery: Secondary | ICD-10-CM

## 2022-10-08 DIAGNOSIS — R262 Difficulty in walking, not elsewhere classified: Secondary | ICD-10-CM | POA: Diagnosis not present

## 2022-10-08 DIAGNOSIS — R278 Other lack of coordination: Secondary | ICD-10-CM | POA: Diagnosis not present

## 2022-10-08 DIAGNOSIS — R2681 Unsteadiness on feet: Secondary | ICD-10-CM

## 2022-10-08 NOTE — Therapy (Signed)
OUTPATIENT PHYSICAL THERAPY NEURO TREATMENT    Patient Name: Teresa [name] MRN: [id number] DOB:May 21, 1940, 82 y.o., female Today's Date: 10/08/2022   PCP: Dr. Aram Beecham REFERRING PROVIDER: Dr. Aram Beecham  END OF SESSION:  PT End of Session - 10/08/22 1150     Visit Number 31    Number of Visits 41    Date for PT Re-Evaluation 11/05/22    Authorization Type [employer] PPO-    Progress Note Due on Visit 40    PT Start Time 1145    PT Stop Time 1225    PT Time Calculation (min) 40 min    Equipment Utilized During Treatment Gait belt    Activity Tolerance Patient tolerated treatment well;No increased pain    Behavior During Therapy WFL for tasks assessed/performed                              Past Medical History:  Diagnosis Date   Cancer (HCC)    skin   Hypothyroidism    Raynaud's disease    Stroke [facility])    Past Surgical History:  Procedure Laterality Date   ABDOMINAL HYSTERECTOMY  1991   APPENDECTOMY  1991   BACK SURGERY  2010   Cervical fusion C4-5-6-7   CATARACT EXTRACTION, BILATERAL Bilateral 10/2016   CHOLECYSTECTOMY  1995   COLONOSCOPY WITH PROPOFOL N/A 12/08/2018   Procedure: COLONOSCOPY WITH PROPOFOL;  Surgeon: Toledo, Boykin Nearing, MD;  Location: [facility] ENDOSCOPY;  Service: Gastroenterology;  Laterality: N/A;   EYE SURGERY     Patient Active Problem List   Diagnosis Date Noted   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   Stroke (HCC) 02/27/2021   History of nonmelanoma skin cancer 05/23/2014    ONSET DATE: 04/16/2022  REFERRING DIAG: 69.354 (ICD-10-CM) - Hemiplegia and hemiparesis following cerebral infarction affecting left non-dominant side   THERAPY DIAG:  Muscle weakness (generalized)  Other lack of coordination  Right middle cerebral artery stroke (HCC)  Unsteadiness on feet  Difficulty in walking, not elsewhere classified  Other abnormalities of gait and mobility  Rationale for  Evaluation and Treatment: Rehabilitation  SUBJECTIVE:                                                                                                                                                                                             SUBJECTIVE STATEMENT: Patient reports doing well. Has bilat forearm bruising from a beagle puppy she encountered in [place]. Pt shows a Rt midfoot bruise sustained due to her husband tieing her shoe too tight, however,  not painful then and not painful now.     Pt accompanied by: self  PERTINENT HISTORY: Pt is a 82 y.o. female with referral for hemiplegia with left sided weakness -original CVA on 02/26/21 and new onset of left sided weakness on 04/16/2022.  R MCA CVA on 02/26/21. Pt completed outpatient PT on 01/17/2022 with majority of goals met. PMH includes: skin cancer, hypothyroidism, and Raynaud's disease, history of cervical fusion in 2010 C4-C7.   PAIN:  Are you having pain?  no  PRECAUTIONS: Fall  WEIGHT BEARING RESTRICTIONS: No  FALLS: Has patient fallen in last 6 months? No    PATIENT GOALS: I want to walk without this cane and no falls and enjoy going out to grandkids sporting event  OBJECTIVE:    TODAY'S TREATMENT:                                                                                                                              DATE: 10/08/22   In the //bars  -forward AMB on 2x4 6x, minimal hands ad lib -side stepping in 2x4 3x bilat, minimal hands ad lib -lateral cone step overs 3 round trips bilat   -AMB high knee marching 2x30ft  -AMB high knee marching c blue weight ball 4x104ft   -overground AMB obstacle course with soft surface step up/down, step over, wobble rocker board, 10 minutes, modifying thoughout to increase difficulty level *1 significan tLOB sustained while on red mat, resultant crossover stepping with isometric stabilization x3sec   PATIENT EDUCATION: Education details: Pt educated throughout  session about proper posture and technique with exercises. Improved exercise technique, movement at target joints, use of target muscles after min to mod verbal, visual, tactile cues.  Person educated: Patient Education method: Explanation, Demonstration, Tactile cues, and Verbal cues Education comprehension: verbalized understanding, returned demonstration, verbal cues required, tactile cues required, and needs further education  HOME EXERCISE PROGRAM: None on record.   GOALS: Goals reviewed with patient? Yes  SHORT TERM GOALS: Target date: 07/04/2022  Patient will be independent in home exercise program to improve strength/mobility for better functional independence with ADLs  Baseline: EVAL: Patient reports mostly just walking; 07/09/22: Pt reports her exercises are going well, to be updated future date Goal status: IN PROGRESS  LONG TERM GOALS: Target date: 11/05/2022   Pt will decrease 5TSTS by at least 6 seconds in order to demonstrate clinically significant improvement in LE strength. Baseline: EVAL= 23.62 sec without UE; 07/09/22:  13 seconds hands-free  Goal status: MET  2. Pt will improve FOTO to target score of 66 to display perceived improvements in ability to complete ADL's.  Baseline: Eval= 60; 07/09/22: 65 5/21: 59% Goal status: PARTIALLY MET  3.  Pt will decrease TUG to below 14 seconds/decrease in order to demonstrate decreased fall risk. Baseline: Eval= 20.14 sec without AD; 07/09/22: 11 seconds no AD Goal status: MET  4.  Patient will increase six minute walk test distance  to >1200 ft for improve gait ability and return to PLOF to complete her walks in her neighborhood.  Baseline: EVAL= 915 feet without an AD; 07/09/22: 1184 ft without AD 5/21: 1060 ft  Goal status: Partially MET  5. Pt will increase by at least 0.13 m/s in order to demonstrate clinically significant improvement in community ambulation.    Baseline: Eval = 0.70 m/s; 07/09/2022: 1.04 m/s without  AD   Goal status: MET   6. Pt will report that she has not hit LLE/LUE on doorframe/walls/furniture while ambulating in the past two weeks to indicate improved ability to scan environment and to decrease injury risk with gait.  Baseline: currently has hit furniture/doorframe 2x/week 5/21: hitting doorframe less frequently. 08/23/2022- Patient reports no bumping arms into any walls or furniture since last tested on 08/13/2022. 09/27/2022= Patient reports no bumping arms into any walls or furniture since last tested on 08/23/2022.   Goal status: MET  7. Patient will increase Functional Gait Assessment score to >20/30 as to reduce fall risk and improve dynamic gait safety with community ambulation.  Baseline:5/21: 13/30; 09/27/2022= 19/30   Goal status: PROGRESSING   ASSESSMENT:  CLINICAL IMPRESSION:  Continued to work on dynamic balance with gait bias. Pt works hard ot perform his righting strategies hands free when LOB occur. The pt will benefit from skilled PT services to improve her overall left LE muscle strength and improve her mobility to decrease her fall risk and improve her overall quality of life.   OBJECTIVE IMPAIRMENTS: Abnormal gait, decreased activity tolerance, decreased balance, decreased coordination, decreased endurance, decreased mobility, difficulty walking, and decreased strength.   ACTIVITY LIMITATIONS: carrying, lifting, bending, standing, squatting, stairs, and transfers  PARTICIPATION LIMITATIONS: cleaning, laundry, driving, shopping, community activity, and yard work  PERSONAL FACTORS: 3+ comorbidities: OA, HTN, hx of previous CVA  are also affecting patient's functional outcome.   REHAB POTENTIAL: Good  CLINICAL DECISION MAKING: Evolving/moderate complexity  EVALUATION COMPLEXITY: Moderate  PLAN:  PT FREQUENCY: 1-2x/week  PT DURATION: 12 weeks  PLANNED INTERVENTIONS: Therapeutic exercises, Therapeutic activity, Neuromuscular re-education, Balance training, Gait  training, Patient/Family education, Self Care, Joint mobilization, Joint manipulation, Stair training, Vestibular training, Canalith repositioning, DME instructions, Dry Needling, Electrical stimulation, Spinal manipulation, Spinal mobilization, Cryotherapy, Moist heat, Taping, and Manual therapy  PLAN FOR NEXT SESSION:  Progress gait training to include dual-cog tasks, BLE endurance & dynamic balance.    Rosamaria Lints PT  [facility]  10/08/2022, 11:53 AM   Physical Therapist - [facility]  Outpatient Physical Therapy- Main Campus [phone]

## 2022-10-09 LAB — CUP PACEART REMOTE DEVICE CHECK
Date Time Interrogation Session: 20240713230501
Implantable Pulse Generator Implant Date: 19440924

## 2022-10-09 NOTE — Therapy (Signed)
OUTPATIENT OCCUPATIONAL THERAPY NEURO TREATMENT NOTE        Patient Name: Teresa [name] MRN: [id number] DOB:01/17/1941, 82 y.o., female Today's Date: 05/23/2022  PCP: Dr. Aram Beecham REFERRING PROVIDER: Dr. Aram Beecham   OT End of Session - 10/09/22 1134     Visit Number 132    Number of Visits 138    Date for OT Re-Evaluation 10/10/22    Authorization Time Period Reporting period beginning 09/24/22    Progress Note Due on Visit 10    OT Start Time 1100    OT Stop Time 1145    OT Time Calculation (min) 45 min    Equipment Utilized During Treatment cane    Activity Tolerance Patient tolerated treatment well    Behavior During Therapy WFL for tasks assessed/performed             Past Medical History:  Diagnosis Date   Cancer (HCC)    skin   Hypothyroidism    Raynaud's disease    Stroke 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  Following inpt  rehab, pt went home and had home health.   PRECAUTIONS: fall  SUBJECTIVE: Pt reports she and her husband had a long car ride, but they were able to make it back home to [place] for a funeral last week.    PAIN:  Are you having pain? none  OBJECTIVE:  L grip 6, R grip 41 L lateral pinch 5, R 13 L 3 point pinch 4, R 14  L 9 hole 1 min 43 sec. L shoulder active flexion 0-105, passive 0-125 02/05/22: L grip 13#  L lateral pinch 8#  3 point pinch 5#  L shoulder flex 0-108 03/12/22:  L grip: 12# L lateral pinch: 8# 3 point pinch: 6# L 9 hole: 1 min 3 sec L shoulder flexion 0-110 04/16/22: L grip: 3# L lateral pinch: 4# 3 point pinch: difficulty maintaining 3 point pinch  L 9 hole: 5 pegs in 2 min 36 sec  L shoulder flexion 0-107 BP L 132/79 HR 80 02 sats 96% 04/25/22: L grip: 14#; R grip: 50# L lateral 2#, R lateral 17# 3 point pinch: L 4#, R 14# (Saehan pinch gauge) 9 hole peg test: 1 min, 48 sec L shoulder flex 125 (R shoulder 132 active) 05/21/22: L grip: 15#; R grip: 50# L lateral 8#, R lateral 20# 3 point pinch: L 4#, R 12# Caryl Comes  pinch gauge) 9 hole peg test: 1 min, 23 sec 07/04/22: L grip: 19# L lateral 10# 3 point pinch: L 6# (standard pinch gauge-not Saehan) 9 hole peg test: 1 min, 14 sec 07/18/22:  L grip 22# L lateral: 9# L 3 point pinch: 6# L 9 hole: 1 min 8 sec 08/20/22: (Feeling sluggish since 08/16/22 after ED visit for medication adverse reaction) L grip: 21# L lateral: 7# L 3 point pinch: 7# (standard pinch gauge) L 9 hole: 4 pegs in 3 min (OT stopped at 3 min).  08/23/22:  L 9 hole: 1 min 12 sec 09/24/22:  L grip: 21# L lateral: 10# L 3 point pinch: 7# (standard pinch gauge) L 9 hole: 1 min 5 sec   TODAY'S TREATMENT  Therapeutic Activity: Facilitated L FMC/dexterity skills working to [employer], chips, and a string of marbles in hand, moving them from palm to fingertips, repositioning above noted items within fingertips, and opposing  items between fingertips.  Pt practiced tying a snug knot into a rope string and untying knots, requiring extra time and using L hand as a stabilizer while the R hand tugged to loosen the knot.  Pt practiced picking up small cubes from non-skid surface and placing them into narrow container, practicing pick up with and without resistive tweezers.  Pt required max vc for successful use of resistive tweezers to time her squeeze and release to limit dropping, but ultimately dropped more than placed and transitioned to working to pick up cubes with fingertips only.    PATIENT EDUCATION: Education details: progress towards goals, planning d/c next session Person educated: pt Education method: explanation Education comprehension: verbalized understanding; pt in agreement with plan  HOME EXERCISE PROGRAM Continue to engage LUE into ADLs; continue use of putty for gripping and pinching exercises for LUE, and L shoulder AROM/AAROM, crocheting, typing; increase participation in IADL tasks for greater use of L hand.   OT Short Term Goals - 08/29/22 (6 weeks)      OT SHORT TERM GOAL #1   Title Pt will be independent with home exercise program.    Baseline Eval: no current program, 10th visit:  continue to add new exercises as pt progresses, 20th:  continue to update HEP; 08/17/21: continue to progress HEP when indicated.  Visit 40: adding new exercises as pt progresses; 12/25/21: ongoing with progressions; 03/12/22: inconsistent use of putty but regular use of stress ball (every other day).  Encouraged pt return to daily putty use (2x daily when able) to target grip and pinch strengthening; 04/16/22: not reviewed today d/t OT sent pt to ED; 04/25/22: added pulleys and pt has re-started putty; 05/21/22: stopped pulleys and added stronger theraputty and encouraged pt crochet often; 07/04/22: pt working consistently with putty and grip strengthener at 20# at home; 07/18/22: pt continues to work on putty and Building surveyor  and will be starting with her crocheting again to progress [facility] skills; 08/20/22: Pt continues to use her grip strengthener daily set at 20#; will continue to modify as needed; 09/24/22: indep; pt continues to use grip strengthener and crochets daily   Time 12    Period Weeks    Status achieved   Target Date 08/29/22     OT LONG TERM GOAL #2   Title Pt will complete UB and LB dressing with modified independence including buttons, snaps and zippers.    Baseline requires min assist at eval, 10th visit: occasional assist with buttons, 20th:  able to perform one handed,  but difficulty with bilateral UE; 08/17/21: pt reports inconsistent with 1 hand, reviewed techniques this visit, Visit 40:  Pt requires assist with bra   Time 12    Period Weeks    Status MET   Target Date 11/08/21     OT LONG TERM GOAL #3   Title Pt will perform shower transfer with modified independence.    Baseline Pt requires supervision to min assist for shower transfer at home. 10th visit: supervision; 08/17/21: supv .  Visit 40:  Pt had met goal but had recent fall and now requiring min guard to supv   Time 6    Period Weeks    Status  Met    Target Date 11/08/21      OT LONG TERM GOAL #4 10/10/22 (12 weeks)    Title Pt will improve L hand grip to 25 or more #s to enable pt to open a new jar.   Baseline no grip in left hand at eval, 10th visit:  improved flexion but still working towards composite fisting and grip. 20th:  continues to demo decreased grip; 08/17/21: active digit flexion improving, but not yet able to register grip on dynamometer; 09/04/21: L grip 1# 7/17:  5#; 12/25/21: L grip 6#; 02/05/22: 13#; 03/12/22: L grip 12#; 1/23//24: L grip 3# (decreased); 04/25/22: L grip 14#; spouse assists to open new jars; 05/21/22: L grip 15#; 07/04/22: L grip 19#; 07/18/22: L grip 22#, difficulty opening a new jar (R grip 50#); 08/20/22: L grip 21# (increased weakness since Friday after adverse reaction to a new medication resulting in ED  visit); 09/24/22: L grip 21#, pt reports she can consistently open small jars (ie relish or apple sauce) using dycem.   Time 12    Period Weeks    Status ongoing   Target Date 10/10/22     OT LONG TERM GOAL #5   Title Pt will improve left shoulder flexion to 100 degrees or better to improve reaching to obtain self care items from shelf/shoulder height.    Baseline difficulty with reach, shoulder flexion to 47 degrees; 08/17/21: L shoulder flexion 85, but not yet able to consistently hold ADL supplies in L hand when reaching; 09/04/21: flexion 85 7/17:  shoulder flexion to 90; 12/25/21: 105 (P 125); 03/12/22: active L shoulder flexion 110 (pt uses R arm to reach for items at shoulder height or above); 04/16/22: L shoulder flexion 107; 04/25/22: L shoulder flexion 125*, R 132*    Time 12    Period Weeks    Status achieved   Target Date 04/25/22     OT LONG TERM GOAL #6   Title Pt will improve FOTO score to 47 or above to demonstrate a clinically relevant change in function to impact ADL tasks.    Baseline score of 30 at eval; 08/17/21: FOTO: 42; 09/04/21: FOTO : 54 , FOTO 7/7:  60; 12/25/21: 59; 03/12/22: 66; 04/16/22: 67; 04/25/22; 51; 05/21/22: 74; 07/04/22: 74; 07/18/22: 71; 08/20/22: 77; 09/24/22: 71   Time 12    Period Weeks    Status  MET/ongoing    Target Date 10/10/22     OT LONG TERM GOAL #7   Title Pt will demonstrate ability to pick up small objects with L hand and complete 9 hole peg test in less than 1 min (revised)   Baseline unable to perform at eval, 10th visit: still unable to pick up small pegs, 20th: unable to pick up pegs but improving; 08/17/21: not attempted  d/t time constraints, will assess next visit; 09/04/21: 9 hole peg test in 5 min 53 sec;  7/17:  Pt unable to complete this date but was able to place 6 of 9 pegs in 4 mins 20 secs; 12/25/21: L 1 min 43 sec; 03/12/22: L 1 min 2 sec (requires non-skid surface to pick up small items from table top); 04/16/22: 5 pegs in 2 min 36 secs; 04/25/22: 1 min  48 sec; 05/21/22: L 1 min 23 sec; 07/04/22: 1 min 14 sec; 08/20/22: Able to complete 4 pegs in 3 min before OT stopped test (declined LUE motor skills since ED visit Friday 08/16/22 d/t adverse reaction to a new med); 09/24/22: 1 min 5 sec   Time 12    Period Weeks    Status Revised/On-going    Target Date 10/10/22    OT LONG TERM GOAL #8   Title Pt will increase LUE strength by 1 MM grade in order to hold blow dryer in LUE to dry hair without dropping dryer.   Baseline 04/25/22: Unable to sustain grip and maintain lift of LUE with hand near head without dropping dryer.  Dryer has caused bruising above L eye with previous attempts.  L shoulder flex/abd 3/5, elbow flex/ext 3+/5; 07/04/22: L shoulder flex/abd 3+, elbow and wrist flex/ext 4+; 09/24/22: L shoulder flex/abd 4-, elbow flex 5/5, elbow ext 4+, wrist flex/ext 4+; Pt reports she hasn't dropped the blow dryer in months   Time 12    Period Weeks    Status achieved   Target Date 10/10/22        OT LONG TERM GOAL #9   Title Pt will increase L hand dexterity to enable pt to independently manage clothing fasteners.   Baseline 04/25/22: pt ties shoe laces loosely and requires assist from spouse occasionally.  When typing, pt engages the L hand for ~25% of the task.  Pt avoids wearing jeans d/t inability to manage button or zipper; 07/04/22: no change yet from 04/25/22; 07/18/22: Managed zipper today, but increased time and effort to manage buttons on pants; able to tie shoes loosely with increased time; 08/20/23: better efficiency with managing zipper on a jacket; pt manages buttons 1 handed (R); able to tie shoes without laces being loose; 09/24/22: able to hook bra consistently in the front, managing buttons 2 handed, managing zippers 2 handed, tying shoes more snug.    Time 12    Period Weeks    Status Achieved    Target Date 10/10/22        OT LONG TERM GOAL #10   Title Pt will be able to independently carry a light plate of food or drink in L hand without  spilling/dropping to increase efficiency with item transport in the kitchen.   Baseline 04/25/22: Pt requires constant cues to keep cup or plate level while multitasking in order to prevent spilling; 07/04/22: Pt reports she is carrying her coffee cup from kitchen to neighboring room (not yet consistent to avoid spills); 08/20/22: Pt has not been using L hand to carry items much since Friday d/t increased weakness since ED visit; 09/24/22: able to manage when looking at hand but consistently drops items when distracted/dual tasking   Time 12    Period Weeks    Status ongoing   Target Date 10/10/22     Plan -     Clinical Impression Statement Pt returned this week after 1 week off to attend a funeral in [place] last week.  Pt  reports her L hand has not been swelling, and her rings on the L hand are actually now loose.  Pt reports that she's continued to work on her crocheting and using her hand gripper at home to provide exercise to the L hand.  Continued focus on L hand FMC/dexterity skills.  Pt required max vc for successful use of resistive tweezers to time her squeeze and release to limit dropping, but ultimately dropped more than placed and transitioned to working to pick up cubes with fingertips only.  Reviewed plan to d/c next visit with recommendation for pt to continue with her HEP.  Pt in agreement with plan.   OT Occupational Profile and History Detailed Assessment- Review of Records and additional review of physical, cognitive, psychosocial history related to current functional performance    Occupational performance deficits (Please refer to evaluation for details): ADL's;IADL's;Leisure;Rest and Sleep    Body Structure / Function / Physical Skills ADL;Coordination;Endurance;GMC;UE functional use;Balance;IADL;Pain;Dexterity;FMC;Strength;Edema;Mobility;ROM    Psychosocial Skills Environmental  Adaptations;Habits;Routines and Behaviors    Rehab Potential Good    Clinical Decision Making Several  treatment options, min-mod task modification necessary   Comorbidities Affecting Occupational Performance: May have comorbidities impacting occupational performance    Modification or Assistance to Complete Evaluation  Min-Moderate modification of tasks or assist with assess necessary to complete eval    OT Frequency 2x / week    OT Duration 12 weeks    OT Treatment/Interventions Self-care/ADL training;Cryotherapy;Paraffin;Therapeutic exercise;DME and/or AE instruction;Functional Mobility Training;Balance training;Electrical Stimulation;Ultrasound;Neuromuscular education;Manual Therapy;Splinting;Moist Heat;Contrast Bath;Passive range of motion;Therapeutic activities;Patient/family education;Coping strategies training    Plan OT recert    Consulted and Agree with Plan of Care Patient             Otis Dials, OT 10/09/2022, 11:35 AM

## 2022-10-10 ENCOUNTER — Ambulatory Visit: Payer: Medicare HMO

## 2022-10-10 DIAGNOSIS — R278 Other lack of coordination: Secondary | ICD-10-CM | POA: Diagnosis not present

## 2022-10-10 DIAGNOSIS — M6281 Muscle weakness (generalized): Secondary | ICD-10-CM

## 2022-10-10 DIAGNOSIS — I63511 Cerebral infarction due to unspecified occlusion or stenosis of right middle cerebral artery: Secondary | ICD-10-CM | POA: Diagnosis not present

## 2022-10-10 DIAGNOSIS — R2681 Unsteadiness on feet: Secondary | ICD-10-CM | POA: Diagnosis not present

## 2022-10-10 DIAGNOSIS — R262 Difficulty in walking, not elsewhere classified: Secondary | ICD-10-CM | POA: Diagnosis not present

## 2022-10-10 DIAGNOSIS — R2689 Other abnormalities of gait and mobility: Secondary | ICD-10-CM | POA: Diagnosis not present

## 2022-10-10 NOTE — Therapy (Signed)
OUTPATIENT PHYSICAL THERAPY NEURO TREATMENT    Patient Name: Teresa [name] MRN: [id number] DOB:June 06, 1940, 82 y.o., female Today's Date: 10/10/2022   PCP: Dr. Aram Beecham REFERRING PROVIDER: Dr. Aram Beecham  END OF SESSION:  PT End of Session - 10/10/22 1126     Visit Number 32    Number of Visits 41    Date for PT Re-Evaluation 11/05/22    Authorization Type [employer] PPO-    Authorization Time Period 05/23/2022- 08/15/2022    Progress Note Due on Visit 40    PT Start Time 1107    PT Stop Time 1145    PT Time Calculation (min) 38 min    Equipment Utilized During Treatment Gait belt    Activity Tolerance Patient tolerated treatment well;No increased pain    Behavior During Therapy WFL for tasks assessed/performed                              Past Medical History:  Diagnosis Date   Cancer (HCC)    skin   Hypothyroidism    Raynaud's disease    Stroke [facility])    Past Surgical History:  Procedure Laterality Date   ABDOMINAL HYSTERECTOMY  1991   APPENDECTOMY  1991   BACK SURGERY  2010   Cervical fusion C4-5-6-7   CATARACT EXTRACTION, BILATERAL Bilateral 10/2016   CHOLECYSTECTOMY  1995   COLONOSCOPY WITH PROPOFOL N/A 12/08/2018   Procedure: COLONOSCOPY WITH PROPOFOL;  Surgeon: Toledo, Boykin Nearing, MD;  Location: [facility] ENDOSCOPY;  Service: Gastroenterology;  Laterality: N/A;   EYE SURGERY     Patient Active Problem List   Diagnosis Date Noted   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   Stroke (HCC) 02/27/2021   History of nonmelanoma skin cancer 05/23/2014    ONSET DATE: 04/16/2022  REFERRING DIAG: 69.354 (ICD-10-CM) - Hemiplegia and hemiparesis following cerebral infarction affecting left non-dominant side   THERAPY DIAG:  Muscle weakness (generalized)  Other lack of coordination  Unsteadiness on feet  Difficulty in walking, not elsewhere classified  Rationale for Evaluation and Treatment:  Rehabilitation  SUBJECTIVE:                                                                                                                                                                                             SUBJECTIVE STATEMENT: Patient reports doing well. Has bilat forearm bruising from a beagle puppy she encountered in [place]. Pt shows a Rt midfoot bruise sustained due to her husband tieing her shoe too tight, however, not painful then and not painful  now.     Pt accompanied by: self  PERTINENT HISTORY: Pt is a 82 y.o. female with referral for hemiplegia with left sided weakness -original CVA on 02/26/21 and new onset of left sided weakness on 04/16/2022.  R MCA CVA on 02/26/21. Pt completed outpatient PT on 01/17/2022 with majority of goals met. PMH includes: skin cancer, hypothyroidism, and Raynaud's disease, history of cervical fusion in 2010 C4-C7.   PAIN:  Are you having pain?  no  PRECAUTIONS: Fall  WEIGHT BEARING RESTRICTIONS: No  FALLS: Has patient fallen in last 6 months? No    PATIENT GOALS: I want to walk without this cane and no falls and enjoy going out to grandkids sporting event  OBJECTIVE:    TODAY'S TREATMENT:                                                                                                                              DATE: 10/10/22  In the //bars  In the //bars  High knee march 4# AW- in // bars CGA- down and back x 6 Walking ham curls- 4# AW- in // bars CGA- down and back x 4 Side step with squat 4# AW in // bars - CGA 5x bilat Backward Walking 3x c 4#  In the //bars  -forward AMB on 2x4 8x, minimal hands ad lib -side stepping in 2x4 3x bilat, minimal hands ad lib  -AMB balanc ebeam x6, then 4x with beam on airex pads  -standing on airex, basketball self toss/catch x30 (arms getting tired, hurt)  *sit break -soccer ball kick/rebound at wall (~4 ft away x 4 minutes, then 28ft away x3 minutes)    PATIENT EDUCATION: Education  details: Pt educated throughout session about proper posture and technique with exercises. Improved exercise technique, movement at target joints, use of target muscles after min to mod verbal, visual, tactile cues.  Person educated: Patient Education method: Explanation, Demonstration, Tactile cues, and Verbal cues Education comprehension: verbalized understanding, returned demonstration, verbal cues required, tactile cues required, and needs further education  HOME EXERCISE PROGRAM: None on record.   GOALS: Goals reviewed with patient? Yes  SHORT TERM GOALS: Target date: 07/04/2022  Patient will be independent in home exercise program to improve strength/mobility for better functional independence with ADLs  Baseline: EVAL: Patient reports mostly just walking; 07/09/22: Pt reports her exercises are going well, to be updated future date Goal status: IN PROGRESS  LONG TERM GOALS: Target date: 11/05/2022   Pt will decrease 5TSTS by at least 6 seconds in order to demonstrate clinically significant improvement in LE strength. Baseline: EVAL= 23.62 sec without UE; 07/09/22:  13 seconds hands-free  Goal status: MET  2. Pt will improve FOTO to target score of 66 to display perceived improvements in ability to complete ADL's.  Baseline: Eval= 60; 07/09/22: 65 5/21: 59% Goal status: PARTIALLY MET  3.  Pt will decrease TUG to below 14 seconds/decrease in order to demonstrate  decreased fall risk. Baseline: Eval= 20.14 sec without AD; 07/09/22: 11 seconds no AD Goal status: MET  4.  Patient will increase six minute walk test distance to >1200 ft for improve gait ability and return to PLOF to complete her walks in her neighborhood.  Baseline: EVAL= 915 feet without an AD; 07/09/22: 1184 ft without AD 5/21: 1060 ft  Goal status: Partially MET  5. Pt will increase by at least 0.13 m/s in order to demonstrate clinically significant improvement in community ambulation.    Baseline: Eval = 0.70  m/s; 07/09/2022: 1.04 m/s without AD   Goal status: MET   6. Pt will report that she has not hit LLE/LUE on doorframe/walls/furniture while ambulating in the past two weeks to indicate improved ability to scan environment and to decrease injury risk with gait.  Baseline: currently has hit furniture/doorframe 2x/week 5/21: hitting doorframe less frequently. 08/23/2022- Patient reports no bumping arms into any walls or furniture since last tested on 08/13/2022. 09/27/2022= Patient reports no bumping arms into any walls or furniture since last tested on 08/23/2022.   Goal status: MET  7. Patient will increase Functional Gait Assessment score to >20/30 as to reduce fall risk and improve dynamic gait safety with community ambulation.  Baseline:5/21: 13/30; 09/27/2022= 19/30   Goal status: PROGRESSING   ASSESSMENT:  CLINICAL IMPRESSION:  Continued to work on dynamic balance with gait bias. Able to advance balance beam activity given improvements since prior session. Pt does great with soccer dribbling/kicking. The pt will benefit from skilled PT services to improve her overall left LE muscle strength and improve her mobility to decrease her fall risk and improve her overall quality of life.   OBJECTIVE IMPAIRMENTS: Abnormal gait, decreased activity tolerance, decreased balance, decreased coordination, decreased endurance, decreased mobility, difficulty walking, and decreased strength.   ACTIVITY LIMITATIONS: carrying, lifting, bending, standing, squatting, stairs, and transfers  PARTICIPATION LIMITATIONS: cleaning, laundry, driving, shopping, community activity, and yard work  PERSONAL FACTORS: 3+ comorbidities: OA, HTN, hx of previous CVA  are also affecting patient's functional outcome.   REHAB POTENTIAL: Good  CLINICAL DECISION MAKING: Evolving/moderate complexity  EVALUATION COMPLEXITY: Moderate  PLAN:  PT FREQUENCY: 1-2x/week  PT DURATION: 12 weeks  PLANNED INTERVENTIONS: Therapeutic  exercises, Therapeutic activity, Neuromuscular re-education, Balance training, Gait training, Patient/Family education, Self Care, Joint mobilization, Joint manipulation, Stair training, Vestibular training, Canalith repositioning, DME instructions, Dry Needling, Electrical stimulation, Spinal manipulation, Spinal mobilization, Cryotherapy, Moist heat, Taping, and Manual therapy  PLAN FOR NEXT SESSION:  Progress gait training to include dual-cog tasks, BLE endurance & dynamic balance.    Rosamaria Lints PT  [facility]  10/10/2022, 11:28 AM   Physical Therapist - [facility]  Outpatient Physical Therapy- Main Campus [phone]

## 2022-10-10 NOTE — Therapy (Signed)
OUTPATIENT OCCUPATIONAL THERAPY NEURO DISCHARGE NOTE        Patient Name: Teresa [name] MRN: [id number] DOB:05-Jan-1941, 82 y.o., female Today's Date: 05/23/2022  PCP: Dr. Aram Beecham REFERRING PROVIDER: Dr. Aram Beecham   OT End of Session - 10/10/22 1146     Visit Number [id number]    Number of Visits 138    Date for OT Re-Evaluation 10/10/22    Authorization Time Period Reporting period beginning 09/24/22    Progress Note Due on Visit 10    OT Start Time 1145    OT Stop Time 1230    OT Time Calculation (min) 45 min    Equipment Utilized During Treatment cane    Activity Tolerance Patient tolerated treatment well    Behavior During Therapy WFL for tasks assessed/performed            Past Medical History:  Diagnosis Date   Cancer (HCC)    skin   Hypothyroidism    Raynaud's disease    Stroke [facility])    Past Surgical History:  Procedure Laterality Date   ABDOMINAL HYSTERECTOMY  1991   APPENDECTOMY  1991   BACK SURGERY  2010   Cervical fusion C4-5-6-7   CATARACT EXTRACTION, BILATERAL Bilateral 10/2016   CHOLECYSTECTOMY  1995   COLONOSCOPY WITH PROPOFOL N/A 12/08/2018   Procedure: COLONOSCOPY WITH PROPOFOL;  Surgeon: Toledo, Boykin Nearing, MD;  Location: [facility] ENDOSCOPY;  Service: Gastroenterology;  Laterality: N/A;   EYE SURGERY     Patient Active Problem List   Diagnosis Date Noted   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   Stroke (HCC) 02/27/2021   History of nonmelanoma skin cancer 05/23/2014   ONSET DATE: 02/26/2021  REFERRING DIAG: R MCA CVA  THERAPY DIAG:  Muscle weakness (generalized)  Other lack of coordination  Right middle cerebral artery stroke [facility])  Rationale for Evaluation and Treatment Rehabilitation  PERTINENT HISTORY: February 26, 2021, pt reports she had a CVA, came to [facility] to ER and then was transferred to [facility] in [place] where she was admitted and after acute care she went to inpatient rehab.  Following inpt  rehab, pt went home and had home health.   PRECAUTIONS: fall  SUBJECTIVE: Pt reports she continues to work on her crocheting every day for an hour to an hour and a half.   PAIN:  Are you having pain? none  OBJECTIVE:  L grip 6, R grip 41 L lateral pinch 5, R 13 L 3 point pinch 4, R 14  L 9 hole 1 min 43 sec. L shoulder active flexion 0-105, passive 0-125 02/05/22: L grip 13#  L lateral pinch 8#  3 point pinch 5#  L shoulder flex 0-108 03/12/22:  L grip: 12# L lateral pinch: 8# 3 point pinch: 6# L 9 hole: 1 min 3 sec L shoulder flexion 0-110 04/16/22: L grip: 3# L lateral pinch: 4# 3 point pinch: difficulty maintaining 3 point pinch  L 9 hole: 5 pegs in 2 min 36 sec  L shoulder flexion 0-107 BP L 132/79 HR 80 02 sats 96% 04/25/22: L grip: 14#; R grip: 50# L lateral 2#, R lateral 17# 3 point pinch: L 4#, R 14# (Saehan pinch gauge) 9 hole peg test: 1 min, 48 sec L shoulder flex 125 (R shoulder 132 active) 05/21/22: L grip: 15#; R grip: 50# L lateral 8#, R lateral 20# 3 point pinch: L 4#, R 12# (Saehan pinch gauge) 9 hole peg test: 1 min, 23  sec 07/04/22: L grip: 19# L lateral 10# 3 point pinch: L 6# (standard pinch gauge-not Saehan) 9 hole peg test: 1 min, 14 sec 07/18/22:  L grip 22# L lateral: 9# L 3 point pinch: 6# L 9 hole: 1 min 8 sec 08/20/22: (Feeling sluggish since 08/16/22 after ED visit for medication adverse reaction) L grip: 21# L lateral: 7# L 3 point pinch: 7# (standard pinch gauge) L 9 hole: 4 pegs in 3 min (OT stopped at 3 min).  08/23/22:  L 9 hole: 1 min 12 sec 09/24/22:  L grip: 21# L lateral: 10# L 3 point pinch: 7# (standard pinch gauge) L 9 hole: 1 min 5 sec  10/10/22:  L grip: 18#, R 49# L lateral: 10#, R 15# L 3 point pinch: 7# R: 13# L 9 hole: 1 min 4 sec, R 26 sec  TODAY'S TREATMENT  Therapeutic Activity:  Objective measures taken and goals updated and reviewed for discharge summary.   Neuro re-ed: Facilitated L hand  FMC/dexterity skills working to pick up and place small pegs into pegboard.  Practiced picking up 1 peg at a time, then storing up to 8 in hand before discarding all from palm.  Reviewed list of recommended coordination activities for home to continue to promote dexterity for daily tasks.  Issued written handout with list of activities.     PATIENT EDUCATION: Education details: progress towards goals, additional coordination activities for HEP Person educated: pt Education method: explanation Education comprehension: verbalized understanding; pt in agreement with plan  HOME EXERCISE PROGRAM Continue to engage LUE into ADLs; continue use of putty for gripping and pinching exercises for LUE, and L shoulder AROM/AAROM, crocheting, typing; increase participation in IADL tasks for greater use of L hand; L hand coordination activities (handout issued)   OT Short Term Goals - 08/29/22 (6 weeks)      OT SHORT TERM GOAL #1   Title Pt will be independent with home exercise program.    Baseline Eval: no current program, 10th visit:  continue to add new exercises as pt progresses, 20th:  continue to update HEP; 08/17/21: continue to progress HEP when indicated.  Visit 40: adding new exercises as pt progresses; 12/25/21: ongoing with progressions; 03/12/22: inconsistent use of putty but regular use of stress ball (every other day).  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   Time 12    Period Weeks    Status achieved   Target Date 08/29/22     OT LONG TERM GOAL #2   Title Pt will complete UB and LB dressing with modified independence including buttons, snaps and zippers.    Baseline requires min assist at eval, 10th visit: occasional assist with buttons, 20th:  able to perform one handed, but difficulty with bilateral UE; 08/17/21: pt reports inconsistent with 1  hand, reviewed techniques this visit, Visit 40:  Pt requires assist with bra   Time 12    Period Weeks    Status MET   Target Date 11/08/21     OT LONG TERM GOAL #3   Title Pt will perform shower transfer with modified independence.    Baseline Pt requires supervision to min assist for shower transfer at home. 10th visit: supervision; 08/17/21: supv .  Visit 40:  Pt had met goal but had recent fall and now requiring min guard to supv   Time 6    Period Weeks    Status  Met    Target Date 11/08/21      OT LONG TERM GOAL #4 10/10/22 (12 weeks)    Title Pt will improve L hand grip to 25 or more #s to enable pt to open a new jar.   Baseline no grip in left hand at eval, 10th visit:  improved flexion but still working towards composite fisting and grip. 20th:  continues to demo decreased grip; 08/17/21: active digit flexion improving, but not yet able to register grip on dynamometer; 09/04/21: L grip 1# 7/17:  5#; 12/25/21: L grip 6#; 02/05/22: 13#; 03/12/22: L grip 12#; 1/23//24: L grip 3# (decreased); 04/25/22: L grip 14#; spouse assists to open new jars; 05/21/22: L grip 15#; 07/04/22: L grip 19#; 07/18/22: L grip 22#, difficulty opening a new jar (R grip 50#); 08/20/22: L grip 21# (increased weakness since Friday after adverse reaction to a new medication resulting in ED visit); 09/24/22: L grip 21#, pt reports she can consistently open small jars (ie relish or apple  sauce) using Dycem; 10/10/22: L grip strength 18# (pt reports she can open a new jar using Dycem).   Time 12    Period Weeks    Status Partially met   Target Date 10/10/22     OT LONG TERM GOAL #5   Title Pt will improve left shoulder flexion to 100 degrees or better to improve reaching to obtain self care items from shelf/shoulder height.    Baseline difficulty with reach, shoulder flexion to 47 degrees; 08/17/21: L shoulder flexion 85, but not yet able to consistently hold ADL supplies in L hand when reaching; 09/04/21: flexion 85 7/17:  shoulder flexion to 90; 12/25/21: 105 (P 125); 03/12/22: active L shoulder flexion 110 (pt uses R arm to reach for items at shoulder height or above); 04/16/22: L shoulder flexion 107; 04/25/22: L shoulder flexion 125*, R 132*    Time 12    Period Weeks    Status achieved   Target Date 04/25/22     OT LONG TERM GOAL #6   Title Pt will improve FOTO score to 47 or above to demonstrate a clinically relevant change in function to impact ADL tasks.    Baseline score of 30 at eval; 08/17/21: FOTO: 42; 09/04/21: FOTO : 54 , FOTO 7/7:  60; 12/25/21: 59; 03/12/22: 66; 04/16/22: 67; 04/25/22; 51; 05/21/22: 74; 07/04/22: 74; 07/18/22: 71; 08/20/22: 77; 09/24/22: 71; 10/10/22: 77   Time 12    Period Weeks    Status  MET   Target Date 10/10/22     OT LONG TERM GOAL #7   Title Pt will demonstrate ability to pick up small objects with L hand and complete 9 hole peg test in less than 1 min (revised)   Baseline unable to perform at eval, 10th visit: still unable to pick up small pegs, 20th: unable to pick up  pegs but improving; 08/17/21: not attempted d/t time constraints, will assess next visit; 09/04/21: 9 hole peg test in 5 min 53 sec;  7/17:  Pt unable to complete this date but was able to place 6 of 9 pegs in 4 mins 20 secs; 12/25/21: L 1 min 43 sec; 03/12/22: L 1 min 2 sec (requires non-skid surface to pick up small items from table top); 04/16/22: 5 pegs in 2 min 36 secs; 04/25/22: 1 min 48 sec;  05/21/22: L 1 min 23 sec; 07/04/22: 1 min 14 sec; 08/20/22: Able to complete 4 pegs in 3 min before OT stopped test (declined LUE motor skills since ED visit Friday 08/16/22 d/t adverse reaction to a new med); 09/24/22: 1 min 5 sec; able to pick up small objects with L hand    Time 12    Period Weeks    Status Improved/partially met   Target Date 10/10/22    OT LONG TERM GOAL #8   Title Pt will increase LUE strength by 1 MM grade in order to hold blow dryer in LUE to dry hair without dropping dryer.   Baseline 04/25/22: Unable to sustain grip and maintain lift of LUE with hand near head without dropping dryer.  Dryer has caused bruising above L eye with previous attempts.  L shoulder flex/abd 3/5, elbow flex/ext 3+/5; 07/04/22: L shoulder flex/abd 3+, elbow and wrist flex/ext 4+; 09/24/22: L shoulder flex/abd 4-, elbow flex 5/5, elbow ext 4+, wrist flex/ext 4+; Pt reports she hasn't dropped the blow dryer in months   Time 12    Period Weeks    Status achieved   Target Date 10/10/22        OT LONG TERM GOAL #9   Title Pt will increase L hand dexterity to enable pt to independently manage clothing fasteners.   Baseline 04/25/22: pt ties shoe laces loosely and requires assist from spouse occasionally.  When typing, pt engages the L hand for ~25% of the task.  Pt avoids wearing jeans d/t inability to manage button or zipper; 07/04/22: no change yet from 04/25/22; 07/18/22: Managed zipper today, but increased time and effort to manage buttons on pants; able to tie shoes loosely with increased time; 08/20/23: better efficiency with managing zipper on a jacket; pt manages buttons 1 handed (R); able to tie shoes without laces being loose; 09/24/22: able to hook bra consistently in the front, managing buttons 2 handed, managing zippers 2 handed, tying shoes more snug.    Time 12    Period Weeks    Status Achieved    Target Date 10/10/22        OT LONG TERM GOAL #10   Title Pt will be able to independently carry a light  plate of food or drink in L hand without spilling/dropping to increase efficiency with item transport in the kitchen.   Baseline 04/25/22: Pt requires constant cues to keep cup or plate level while multitasking in order to prevent spilling; 07/04/22: Pt reports she is carrying her coffee cup from kitchen to neighboring room (not yet consistent to avoid spills); 08/20/22: Pt has not been using L hand to carry items much since Friday d/t increased weakness since ED visit; 09/24/22: able to manage when looking at hand but consistently drops items when distracted/dual tasking; 10/10/22: same as 09/24/22 status   Time 12    Period Weeks    Status Improved/partially met   Target Date 10/10/22     Plan -  Clinical Impression Statement Pt seen this date for OT d/c.  Pt has been seen by OT for 133 sessions to address functional deficits in the LUE post R CVA.  FOTO score has improved from 30 at eval to 77 at discharge.  Pt has been managing all basic self care with modified indep, and has been routinely assisting spouse with meal prep and light housekeeping tasks.  Pt is indep with her HEP and continues to work with her grip strengthener daily, along with crocheting each day for an hour to an hour and a half.  Pt continues to have residual sensory deficits with numbness in the hand and decreased proprioception throughout the LUE.  Dual tasking when using the LUE remains a challenge.  Pt reports that she does transport her coffee cup in her L hand, but states that she remembers to look at her hand to prevent dropping her cup.  When distracted or when vision is not on items being held in her hand, pt does tend to drop objects d/t the numbness and proprioceptive deficits in this arm.  No further skilled OT indicated at this time as pt is now indep with her HEP and has met max rehab potential at this time.  Pt in agreement with plan.   OT Occupational Profile and History Detailed Assessment- Review of Records and additional  review of physical, cognitive, psychosocial history related to current functional performance    Occupational performance deficits (Please refer to evaluation for details): ADL's;IADL's;Leisure;Rest and Sleep    Body Structure / Function / Physical Skills ADL;Coordination;Endurance;GMC;UE functional use;Balance;IADL;Pain;Dexterity;FMC;Strength;Edema;Mobility;ROM    Psychosocial Skills Environmental  Adaptations;Habits;Routines and Behaviors    Rehab Potential Good    Clinical Decision Making Several treatment options, min-mod task modification necessary   Comorbidities Affecting Occupational Performance: May have comorbidities impacting occupational performance    Modification or Assistance to Complete Evaluation  Min-Moderate modification of tasks or assist with assess necessary to complete eval    OT Frequency 2x / week    OT Duration 12 weeks    OT Treatment/Interventions Self-care/ADL training;Cryotherapy;Paraffin;Therapeutic exercise;DME and/or AE instruction;Functional Mobility Training;Balance training;Electrical Stimulation;Ultrasound;Neuromuscular education;Manual Therapy;Splinting;Moist Heat;Contrast Bath;Passive range of motion;Therapeutic activities;Patient/family education;Coping strategies training    Plan OT recert    Consulted and Agree with Plan of Care Patient               Otis Dials, OT 10/10/2022, 3:41 PM

## 2022-10-14 ENCOUNTER — Ambulatory Visit: Payer: Medicare PPO

## 2022-10-14 DIAGNOSIS — I639 Cerebral infarction, unspecified: Secondary | ICD-10-CM | POA: Diagnosis not present

## 2022-10-15 ENCOUNTER — Ambulatory Visit: Payer: Medicare HMO

## 2022-10-15 DIAGNOSIS — R2689 Other abnormalities of gait and mobility: Secondary | ICD-10-CM

## 2022-10-15 DIAGNOSIS — M6281 Muscle weakness (generalized): Secondary | ICD-10-CM

## 2022-10-15 DIAGNOSIS — I63511 Cerebral infarction due to unspecified occlusion or stenosis of right middle cerebral artery: Secondary | ICD-10-CM

## 2022-10-15 DIAGNOSIS — R2681 Unsteadiness on feet: Secondary | ICD-10-CM

## 2022-10-15 DIAGNOSIS — R262 Difficulty in walking, not elsewhere classified: Secondary | ICD-10-CM | POA: Diagnosis not present

## 2022-10-15 DIAGNOSIS — R278 Other lack of coordination: Secondary | ICD-10-CM

## 2022-10-15 NOTE — Therapy (Signed)
OUTPATIENT PHYSICAL THERAPY NEURO TREATMENT    Patient Name: Teresa [name] MRN: [id number] DOB:08-31-1940, 82 y.o., female Today's Date: 10/16/2022   PCP: Dr. Aram Beecham REFERRING PROVIDER: Dr. Aram Beecham  END OF SESSION:  PT End of Session - 10/15/22 1151     Visit Number 33    Number of Visits 41    Date for PT Re-Evaluation 11/05/22    Authorization Type [employer] PPO-    Authorization Time Period 05/23/2022- 08/15/2022    Progress Note Due on Visit 40    PT Start Time 1145    PT Stop Time 1228    PT Time Calculation (min) 43 min    Equipment Utilized During Treatment Gait belt    Activity Tolerance Patient tolerated treatment well;No increased pain    Behavior During Therapy WFL for tasks assessed/performed                              Past Medical History:  Diagnosis Date   Cancer (HCC)    skin   Hypothyroidism    Raynaud's disease    Stroke [facility])    Past Surgical History:  Procedure Laterality Date   ABDOMINAL HYSTERECTOMY  1991   APPENDECTOMY  1991   BACK SURGERY  2010   Cervical fusion C4-5-6-7   CATARACT EXTRACTION, BILATERAL Bilateral 10/2016   CHOLECYSTECTOMY  1995   COLONOSCOPY WITH PROPOFOL N/A 12/08/2018   Procedure: COLONOSCOPY WITH PROPOFOL;  Surgeon: Toledo, Boykin Nearing, MD;  Location: [facility] ENDOSCOPY;  Service: Gastroenterology;  Laterality: N/A;   EYE SURGERY     Patient Active Problem List   Diagnosis Date Noted   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   Stroke (HCC) 02/27/2021   History of nonmelanoma skin cancer 05/23/2014    ONSET DATE: 04/16/2022  REFERRING DIAG: 69.354 (ICD-10-CM) - Hemiplegia and hemiparesis following cerebral infarction affecting left non-dominant side   THERAPY DIAG:  Muscle weakness (generalized)  Other lack of coordination  Unsteadiness on feet  Difficulty in walking, not elsewhere classified  Right middle cerebral artery stroke (HCC)  Other  abnormalities of gait and mobility  Rationale for Evaluation and Treatment: Rehabilitation  SUBJECTIVE:                                                                                                                                                                                             SUBJECTIVE STATEMENT: Patient reports doing okay. Reports she was discharged last week from OT services. States she feels physically as far as legs and balance about 75% back  to baseline.      Pt accompanied by: self  PERTINENT HISTORY: Pt is a 82 y.o. female with referral for hemiplegia with left sided weakness -original CVA on 02/26/21 and new onset of left sided weakness on 04/16/2022.  R MCA CVA on 02/26/21. Pt completed outpatient PT on 01/17/2022 with majority of goals met. PMH includes: skin cancer, hypothyroidism, and Raynaud's disease, history of cervical fusion in 2010 C4-C7.   PAIN:  Are you having pain?  no  PRECAUTIONS: Fall  WEIGHT BEARING RESTRICTIONS: No  FALLS: Has patient fallen in last 6 months? No    PATIENT GOALS: I want to walk without this cane and no falls and enjoy going out to grandkids sporting event  OBJECTIVE:    TODAY'S TREATMENT:                                                                                                                              DATE: 10/15/2022  In the //bars   High knee march x 25 reps- in // bars CGA Step up (from airex pad to 6" step) x 12 reps each LE  Dynamic head turn/nod with 1 foot on airex pad and the opp on 6" step placed in front of airex pad 2 sets of 10 rep (mild unsteadiness   Scap row while standing in tandem using cable- 7.5# x 12 reps then 10 more with feet normal BOS.  *Patient complained of headache that began after head turns from earlier activities   Activity Description: All at once - placed 6 pods scattered on floor and patient would walk as necessary forward/sidestep/retro to corresponding pod Activity Setting:   The Blaze Pod All At Once setting was selected to create a dynamic environment for comprehensive, full-body workouts that challenge users with simultaneous activation of all Pods, fostering agility and enhancing reaction time.    Number of Pods:  6 Cycles/Sets:  3  Activity Description: Random to simulate quick turns as patient reported quick turns at home being problematic Activity Setting:  The Blaze Pod Random setting was chosen to enhance cognitive processing and agility, providing an unpredictable environment to simulate real-world scenarios, and fostering quick reactions and adaptability.   Number of Pods:  6 Cycles/Sets:  3 Duration (Time or Hit Count):  30 sec  Patient Stats  Hits: 1) 5 2) 6 3) 6      PATIENT EDUCATION: Education details: Pt educated throughout session about proper posture and technique with exercises. Improved exercise technique, movement at target joints, use of target muscles after min to mod verbal, visual, tactile cues.  Person educated: Patient Education method: Explanation, Demonstration, Tactile cues, and Verbal cues Education comprehension: verbalized understanding, returned demonstration, verbal cues required, tactile cues required, and needs further education  HOME EXERCISE PROGRAM: None on record.   GOALS: Goals reviewed with patient? Yes  SHORT TERM GOALS: Target date: 07/04/2022  Patient will be independent in home exercise program to improve strength/mobility  for better functional independence with ADLs  Baseline: EVAL: Patient reports mostly just walking; 07/09/22: Pt reports her exercises are going well, to be updated future date Goal status: IN PROGRESS  LONG TERM GOALS: Target date: 11/05/2022   Pt will decrease 5TSTS by at least 6 seconds in order to demonstrate clinically significant improvement in LE strength. Baseline: EVAL= 23.62 sec without UE; 07/09/22:  13 seconds hands-free  Goal status: MET  2. Pt will improve FOTO to target  score of 66 to display perceived improvements in ability to complete ADL's.  Baseline: Eval= 60; 07/09/22: 65 5/21: 59% Goal status: PARTIALLY MET  3.  Pt will decrease TUG to below 14 seconds/decrease in order to demonstrate decreased fall risk. Baseline: Eval= 20.14 sec without AD; 07/09/22: 11 seconds no AD Goal status: MET  4.  Patient will increase six minute walk test distance to >1200 ft for improve gait ability and return to PLOF to complete her walks in her neighborhood.  Baseline: EVAL= 915 feet without an AD; 07/09/22: 1184 ft without AD 5/21: 1060 ft  Goal status: Partially MET  5. Pt will increase by at least 0.13 m/s in order to demonstrate clinically significant improvement in community ambulation.    Baseline: Eval = 0.70 m/s; 07/09/2022: 1.04 m/s without AD   Goal status: MET   6. Pt will report that she has not hit LLE/LUE on doorframe/walls/furniture while ambulating in the past two weeks to indicate improved ability to scan environment and to decrease injury risk with gait.  Baseline: currently has hit furniture/doorframe 2x/week 5/21: hitting doorframe less frequently. 08/23/2022- Patient reports no bumping arms into any walls or furniture since last tested on 08/13/2022. 09/27/2022= Patient reports no bumping arms into any walls or furniture since last tested on 08/23/2022.   Goal status: MET  7. Patient will increase Functional Gait Assessment score to >20/30 as to reduce fall risk and improve dynamic gait safety with community ambulation.  Baseline:5/21: 13/30; 09/27/2022= 19/30   Goal status: PROGRESSING   ASSESSMENT:  CLINICAL IMPRESSION:  Patient participated well overall today. She was able to turn 360 deg well today- no specific LOB. She was able to demo progress and adaption with blaze pod activities and performed well with mild unsteadiness with retro steps yet no LOB with 360 deg. She continues to have some difficulty with single leg balance and instructed to  continue to practice and incorporate into her HEP. The pt will benefit from skilled PT services to improve her overall left LE muscle strength and improve her mobility to decrease her fall risk and improve her overall quality of life.   OBJECTIVE IMPAIRMENTS: Abnormal gait, decreased activity tolerance, decreased balance, decreased coordination, decreased endurance, decreased mobility, difficulty walking, and decreased strength.   ACTIVITY LIMITATIONS: carrying, lifting, bending, standing, squatting, stairs, and transfers  PARTICIPATION LIMITATIONS: cleaning, laundry, driving, shopping, community activity, and yard work  PERSONAL FACTORS: 3+ comorbidities: OA, HTN, hx of previous CVA  are also affecting patient's functional outcome.   REHAB POTENTIAL: Good  CLINICAL DECISION MAKING: Evolving/moderate complexity  EVALUATION COMPLEXITY: Moderate  PLAN:  PT FREQUENCY: 1-2x/week  PT DURATION: 12 weeks  PLANNED INTERVENTIONS: Therapeutic exercises, Therapeutic activity, Neuromuscular re-education, Balance training, Gait training, Patient/Family education, Self Care, Joint mobilization, Joint manipulation, Stair training, Vestibular training, Canalith repositioning, DME instructions, Dry Needling, Electrical stimulation, Spinal manipulation, Spinal mobilization, Cryotherapy, Moist heat, Taping, and Manual therapy  PLAN FOR NEXT SESSION:  Progress gait training to include dual-cog tasks, BLE endurance &  dynamic balance.    Lenda Kelp PT  [facility]  10/16/2022, 8:05 AM   Physical Therapist - [facility]  Outpatient Physical Therapy- Main Campus [phone]

## 2022-10-17 ENCOUNTER — Ambulatory Visit: Payer: Medicare HMO | Admitting: Physical Therapy

## 2022-10-17 ENCOUNTER — Ambulatory Visit: Payer: Medicare HMO

## 2022-10-17 DIAGNOSIS — R2681 Unsteadiness on feet: Secondary | ICD-10-CM

## 2022-10-17 DIAGNOSIS — M6281 Muscle weakness (generalized): Secondary | ICD-10-CM | POA: Diagnosis not present

## 2022-10-17 DIAGNOSIS — R2689 Other abnormalities of gait and mobility: Secondary | ICD-10-CM

## 2022-10-17 DIAGNOSIS — R278 Other lack of coordination: Secondary | ICD-10-CM | POA: Diagnosis not present

## 2022-10-17 DIAGNOSIS — R262 Difficulty in walking, not elsewhere classified: Secondary | ICD-10-CM

## 2022-10-17 DIAGNOSIS — I63511 Cerebral infarction due to unspecified occlusion or stenosis of right middle cerebral artery: Secondary | ICD-10-CM | POA: Diagnosis not present

## 2022-10-17 NOTE — Therapy (Signed)
OUTPATIENT PHYSICAL THERAPY NEURO TREATMENT    Patient Name: Teresa [name] MRN: [id number] DOB:1940/08/11, 82 y.o., female Today's Date: 10/17/2022   PCP: Dr. Aram Beecham REFERRING PROVIDER: Dr. Aram Beecham  END OF SESSION:  PT End of Session - 10/17/22 1016     Visit Number 34    Number of Visits 41    Date for PT Re-Evaluation 11/05/22    Authorization Type [employer] PPO-    Authorization Time Period 05/23/2022- 08/15/2022    Progress Note Due on Visit 40    PT Start Time 1017    PT Stop Time 1059    PT Time Calculation (min) 42 min    Equipment Utilized During Treatment Gait belt    Activity Tolerance Patient tolerated treatment well;No increased pain    Behavior During Therapy WFL for tasks assessed/performed                               Past Medical History:  Diagnosis Date   Cancer (HCC)    skin   Hypothyroidism    Raynaud's disease    Stroke [facility])    Past Surgical History:  Procedure Laterality Date   ABDOMINAL HYSTERECTOMY  1991   APPENDECTOMY  1991   BACK SURGERY  2010   Cervical fusion C4-5-6-7   CATARACT EXTRACTION, BILATERAL Bilateral 10/2016   CHOLECYSTECTOMY  1995   COLONOSCOPY WITH PROPOFOL N/A 12/08/2018   Procedure: COLONOSCOPY WITH PROPOFOL;  Surgeon: Toledo, Boykin Nearing, MD;  Location: [facility] ENDOSCOPY;  Service: Gastroenterology;  Laterality: N/A;   EYE SURGERY     Patient Active Problem List   Diagnosis Date Noted   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   Stroke (HCC) 02/27/2021   History of nonmelanoma skin cancer 05/23/2014    ONSET DATE: 04/16/2022  REFERRING DIAG: 69.354 (ICD-10-CM) - Hemiplegia and hemiparesis following cerebral infarction affecting left non-dominant side   THERAPY DIAG:  Muscle weakness (generalized)  Unsteadiness on feet  Difficulty in walking, not elsewhere classified  Other abnormalities of gait and mobility  Rationale for Evaluation and Treatment:  Rehabilitation  SUBJECTIVE:                                                                                                                                                                                             SUBJECTIVE STATEMENT: Patient reports doing okay. Reports she was discharged last week from OT services. States she feels physically as far as legs and balance about 75% back to baseline.      Pt accompanied by: self  PERTINENT HISTORY: Pt is a 82 y.o. female with referral for hemiplegia with left sided weakness -original CVA on 02/26/21 and new onset of left sided weakness on 04/16/2022.  R MCA CVA on 02/26/21. Pt completed outpatient PT on 01/17/2022 with majority of goals met. PMH includes: skin cancer, hypothyroidism, and Raynaud's disease, history of cervical fusion in 2010 C4-C7.   PAIN:  Are you having pain?  no  PRECAUTIONS: Fall  WEIGHT BEARING RESTRICTIONS: No  FALLS: Has patient fallen in last 6 months? No    PATIENT GOALS: I want to walk without this cane and no falls and enjoy going out to grandkids sporting event  OBJECTIVE:    TODAY'S TREATMENT:                                                                                                                              DATE: 10/15/2022  In the //bars  TE  High knee march x 25 reps- in // bars CGA Step up (from airex pad to 6" step) x 12 reps each LE   NMR  Hedgehog tapping activity with hedgehogs placed around pt and pt challenged with PT cues to turn in various directions then tapping them  X several minutes - altered 2 hedgehogs to place them on obstacles then completed another round    Hedgehog ambulation walking to different hedgehogs on command working on walking in various planes   Walking in tandem  -UE support x 4 lengths of // bars.  -x 4 without UE support, required intermittent UE support  Pt provided with updated and specific HEP for completion of exercises at home. Listed in HEP  below   PATIENT EDUCATION: Education details: Pt educated throughout session about proper posture and technique with exercises. Improved exercise technique, movement at target joints, use of target muscles after min to mod verbal, visual, tactile cues.  Person educated: Patient Education method: Explanation, Demonstration, Tactile cues, and Verbal cues Education comprehension: verbalized understanding, returned demonstration, verbal cues required, tactile cues required, and needs further education  HOME EXERCISE PROGRAM: Access Code: [email or web address] URL: [email or web address] Date: 10/17/2022 Prepared by: [name]  Exercises - Standing Marching  - 1 x daily - 7 x weekly - 1 sets - 20 reps - Side Stepping with Counter Support  - 1 x daily - 7 x weekly - 1 sets - 20 reps - Standing Tandem Balance with Counter Support  - 1 x daily - 7 x weekly - 2 sets - seconds  hold - Sit to Stand with Arms Crossed  - 1 x daily - 7 x weekly - 2 sets - 10 reps - Heel Raises with Counter Support  - 1 x daily - 7 x weekly - 2 sets - 20 reps  GOALS: Goals reviewed with patient? Yes  SHORT TERM GOALS: Target date: 07/04/2022  Patient will be independent in home exercise program to improve strength/mobility for better functional independence with ADLs  Baseline:  EVAL: Patient reports mostly just walking; 07/09/22: Pt reports her exercises are going well, to be updated future date Goal status: IN PROGRESS  LONG TERM GOALS: Target date: 11/05/2022   Pt will decrease 5TSTS by at least 6 seconds in order to demonstrate clinically significant improvement in LE strength. Baseline: EVAL= 23.62 sec without UE; 07/09/22:  13 seconds hands-free  Goal status: MET  2. Pt will improve FOTO to target score of 66 to display perceived improvements in ability to complete ADL's.  Baseline: Eval= 60; 07/09/22: 65 5/21: 59% Goal status: PARTIALLY MET  3.  Pt will decrease TUG to below 14 seconds/decrease in  order to demonstrate decreased fall risk. Baseline: Eval= 20.14 sec without AD; 07/09/22: 11 seconds no AD Goal status: MET  4.  Patient will increase six minute walk test distance to >1200 ft for improve gait ability and return to PLOF to complete her walks in her neighborhood.  Baseline: EVAL= 915 feet without an AD; 07/09/22: 1184 ft without AD 5/21: 1060 ft  Goal status: Partially MET  5. Pt will increase by at least 0.13 m/s in order to demonstrate clinically significant improvement in community ambulation.    Baseline: Eval = 0.70 m/s; 07/09/2022: 1.04 m/s without AD   Goal status: MET   6. Pt will report that she has not hit LLE/LUE on doorframe/walls/furniture while ambulating in the past two weeks to indicate improved ability to scan environment and to decrease injury risk with gait.  Baseline: currently has hit furniture/doorframe 2x/week 5/21: hitting doorframe less frequently. 08/23/2022- Patient reports no bumping arms into any walls or furniture since last tested on 08/13/2022. 09/27/2022= Patient reports no bumping arms into any walls or furniture since last tested on 08/23/2022.   Goal status: MET  7. Patient will increase Functional Gait Assessment score to >20/30 as to reduce fall risk and improve dynamic gait safety with community ambulation.  Baseline:5/21: 13/30; 09/27/2022= 19/30   Goal status: PROGRESSING   ASSESSMENT:  CLINICAL IMPRESSION:  Continued with current plan of care as laid out in evaluation and recent prior sessions. Pt remains motivated to advance progress toward goals in order to maximize independence and safety at home. Pt requires high level assistance and cuing for completion of exercises in order to provide adequate level of stimulation and perturbation. Author allows pt as much opportunity as possible to perform independent righting strategies, only stepping in when pt is unable to prevent falling to floor. Pt continues to demonstrate progress toward  goals AEB progression of some interventions this date either in volume or intensity.   OBJECTIVE IMPAIRMENTS: Abnormal gait, decreased activity tolerance, decreased balance, decreased coordination, decreased endurance, decreased mobility, difficulty walking, and decreased strength.   ACTIVITY LIMITATIONS: carrying, lifting, bending, standing, squatting, stairs, and transfers  PARTICIPATION LIMITATIONS: cleaning, laundry, driving, shopping, community activity, and yard work  PERSONAL FACTORS: 3+ comorbidities: OA, HTN, hx of previous CVA  are also affecting patient's functional outcome.   REHAB POTENTIAL: Good  CLINICAL DECISION MAKING: Evolving/moderate complexity  EVALUATION COMPLEXITY: Moderate  PLAN:  PT FREQUENCY: 1-2x/week  PT DURATION: 12 weeks  PLANNED INTERVENTIONS: Therapeutic exercises, Therapeutic activity, Neuromuscular re-education, Balance training, Gait training, Patient/Family education, Self Care, Joint mobilization, Joint manipulation, Stair training, Vestibular training, Canalith repositioning, DME instructions, Dry Needling, Electrical stimulation, Spinal manipulation, Spinal mobilization, Cryotherapy, Moist heat, Taping, and Manual therapy  PLAN FOR NEXT SESSION:  Progress gait training to include dual-cog tasks, BLE endurance & dynamic balance.    Ernst Bowler  [facility]  [facility]  10/17/2022, 12:57 PM   Physical Therapist - [facility]  Outpatient Physical Therapy- Main Campus [phone]

## 2022-10-21 NOTE — Progress Notes (Signed)
Carelink Summary Report / Loop Recorder 

## 2022-10-22 ENCOUNTER — Ambulatory Visit: Payer: Medicare HMO

## 2022-10-22 DIAGNOSIS — M6281 Muscle weakness (generalized): Secondary | ICD-10-CM | POA: Diagnosis not present

## 2022-10-22 DIAGNOSIS — R278 Other lack of coordination: Secondary | ICD-10-CM | POA: Diagnosis not present

## 2022-10-22 DIAGNOSIS — R2689 Other abnormalities of gait and mobility: Secondary | ICD-10-CM | POA: Diagnosis not present

## 2022-10-22 DIAGNOSIS — R262 Difficulty in walking, not elsewhere classified: Secondary | ICD-10-CM

## 2022-10-22 DIAGNOSIS — I63511 Cerebral infarction due to unspecified occlusion or stenosis of right middle cerebral artery: Secondary | ICD-10-CM | POA: Diagnosis not present

## 2022-10-22 DIAGNOSIS — R2681 Unsteadiness on feet: Secondary | ICD-10-CM

## 2022-10-22 NOTE — Therapy (Signed)
OUTPATIENT PHYSICAL THERAPY NEURO TREATMENT    Patient Name: Teresa [name] MRN: [id number] DOB:Nov 29, 1940, 82 y.o., female Today's Date: 10/22/2022   PCP: Dr. Aram Beecham REFERRING PROVIDER: Dr. Aram Beecham  END OF SESSION:  PT End of Session - 10/22/22 1155     Visit Number 35    Number of Visits 41    Date for PT Re-Evaluation 11/05/22    Authorization Type [employer] PPO-    Authorization Time Period 05/23/2022- 08/15/2022    Progress Note Due on Visit 40    PT Start Time 1148    PT Stop Time 1228    PT Time Calculation (min) 40 min    Equipment Utilized During Treatment Gait belt    Activity Tolerance Patient tolerated treatment well;No increased pain    Behavior During Therapy WFL for tasks assessed/performed                                Past Medical History:  Diagnosis Date   Cancer (HCC)    skin   Hypothyroidism    Raynaud's disease    Stroke [facility])    Past Surgical History:  Procedure Laterality Date   ABDOMINAL HYSTERECTOMY  1991   APPENDECTOMY  1991   BACK SURGERY  2010   Cervical fusion C4-5-6-7   CATARACT EXTRACTION, BILATERAL Bilateral 10/2016   CHOLECYSTECTOMY  1995   COLONOSCOPY WITH PROPOFOL N/A 12/08/2018   Procedure: COLONOSCOPY WITH PROPOFOL;  Surgeon: Toledo, Boykin Nearing, MD;  Location: [facility] ENDOSCOPY;  Service: Gastroenterology;  Laterality: N/A;   EYE SURGERY     Patient Active Problem List   Diagnosis Date Noted   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   Stroke (HCC) 02/27/2021   History of nonmelanoma skin cancer 05/23/2014    ONSET DATE: 04/16/2022  REFERRING DIAG: 69.354 (ICD-10-CM) - Hemiplegia and hemiparesis following cerebral infarction affecting left non-dominant side   THERAPY DIAG:  Muscle weakness (generalized)  Unsteadiness on feet  Difficulty in walking, not elsewhere classified  Other abnormalities of gait and mobility  Rationale for Evaluation and Treatment:  Rehabilitation  SUBJECTIVE:                                                                                                                                                                                             SUBJECTIVE STATEMENT: Patient reports compliant with newest issued HEP without any questions.     Pt accompanied by: self  PERTINENT HISTORY: Pt is a 82 y.o. female with referral for hemiplegia with left sided weakness -original  CVA on 02/26/21 and new onset of left sided weakness on 04/16/2022.  R MCA CVA on 02/26/21. Pt completed outpatient PT on 01/17/2022 with majority of goals met. PMH includes: skin cancer, hypothyroidism, and Raynaud's disease, history of cervical fusion in 2010 C4-C7.   PAIN:  Are you having pain?  no  PRECAUTIONS: Fall  WEIGHT BEARING RESTRICTIONS: No  FALLS: Has patient fallen in last 6 months? No    PATIENT GOALS: I want to walk without this cane and no falls and enjoy going out to grandkids sporting event  OBJECTIVE:    TODAY'S TREATMENT:                                                                                                                              DATE: 10/22/22   In the //bars:  TE  High knee march in // bars, CGA, down and back x 6  Step up (from airex pad to 6" step) x 12 reps each LE   NMR:   Forward/retro step over 4 cones in // bars, CGA x 5 rounds Side steps over 4 cones in // bars, CGA - x 5 rounds  Forward lunge walk- alt UE reaching forward toward front leg/ankle with each step in // bars, CGA,   Walking in tandem  -No UE support x 6 lengths of // bars- required intermittent UE support  Single leg- lean forward T balance- switch feet and repeat in // bars - down and back x 3 without UE support. No LOB  Negative splits (walking with cane 1-3, without on last)  1) 20 sec (instructed to walk slower than normal)  2) 12.29 sec (normal pace)  3) 11.01 sec (little quicker than normal pace)  4) 9.49 sec  (instructed to walk fast)    PATIENT EDUCATION: Education details: Pt educated throughout session about proper posture and technique with exercises. Improved exercise technique, movement at target joints, use of target muscles after min to mod verbal, visual, tactile cues.  Person educated: Patient Education method: Explanation, Demonstration, Tactile cues, and Verbal cues Education comprehension: verbalized understanding, returned demonstration, verbal cues required, tactile cues required, and needs further education  HOME EXERCISE PROGRAM: Access Code: [email or web address] URL: [email or web address] Date: 10/17/2022 Prepared by: [name]  Exercises - Standing Marching  - 1 x daily - 7 x weekly - 1 sets - 20 reps - Side Stepping with Counter Support  - 1 x daily - 7 x weekly - 1 sets - 20 reps - Standing Tandem Balance with Counter Support  - 1 x daily - 7 x weekly - 2 sets - seconds  hold - Sit to Stand with Arms Crossed  - 1 x daily - 7 x weekly - 2 sets - 10 reps - Heel Raises with Counter Support  - 1 x daily - 7 x weekly - 2 sets - 20 reps  GOALS: Goals reviewed with patient? Yes  SHORT TERM GOALS: Target date:  07/04/2022  Patient will be independent in home exercise program to improve strength/mobility for better functional independence with ADLs  Baseline: EVAL: Patient reports mostly just walking; 07/09/22: Pt reports her exercises are going well, to be updated future date Goal status: IN PROGRESS  LONG TERM GOALS: Target date: 11/05/2022   Pt will decrease 5TSTS by at least 6 seconds in order to demonstrate clinically significant improvement in LE strength. Baseline: EVAL= 23.62 sec without UE; 07/09/22:  13 seconds hands-free  Goal status: MET  2. Pt will improve FOTO to target score of 66 to display perceived improvements in ability to complete ADL's.  Baseline: Eval= 60; 07/09/22: 65 5/21: 59% Goal status: PARTIALLY MET  3.  Pt will decrease TUG to below  14 seconds/decrease in order to demonstrate decreased fall risk. Baseline: Eval= 20.14 sec without AD; 07/09/22: 11 seconds no AD Goal status: MET  4.  Patient will increase six minute walk test distance to >1200 ft for improve gait ability and return to PLOF to complete her walks in her neighborhood.  Baseline: EVAL= 915 feet without an AD; 07/09/22: 1184 ft without AD 5/21: 1060 ft  Goal status: Partially MET  5. Pt will increase by at least 0.13 m/s in order to demonstrate clinically significant improvement in community ambulation.    Baseline: Eval = 0.70 m/s; 07/09/2022: 1.04 m/s without AD   Goal status: MET   6. Pt will report that she has not hit LLE/LUE on doorframe/walls/furniture while ambulating in the past two weeks to indicate improved ability to scan environment and to decrease injury risk with gait.  Baseline: currently has hit furniture/doorframe 2x/week 5/21: hitting doorframe less frequently. 08/23/2022- Patient reports no bumping arms into any walls or furniture since last tested on 08/13/2022. 09/27/2022= Patient reports no bumping arms into any walls or furniture since last tested on 08/23/2022.   Goal status: MET  7. Patient will increase Functional Gait Assessment score to >20/30 as to reduce fall risk and improve dynamic gait safety with community ambulation.  Baseline:5/21: 13/30; 09/27/2022= 19/30   Goal status: PROGRESSING   ASSESSMENT:  CLINICAL IMPRESSION: Patient presents with improving overall balance- able to incorporate more single leg standing balance with dynamic balance activities. She was also able to demonstrate improved overall gait speed without LOB today. The pt will benefit from skilled PT services to improve her overall left LE muscle strength and improve her mobility to decrease her fall risk and improve her overall quality of life.     OBJECTIVE IMPAIRMENTS: Abnormal gait, decreased activity tolerance, decreased balance, decreased coordination,  decreased endurance, decreased mobility, difficulty walking, and decreased strength.   ACTIVITY LIMITATIONS: carrying, lifting, bending, standing, squatting, stairs, and transfers  PARTICIPATION LIMITATIONS: cleaning, laundry, driving, shopping, community activity, and yard work  PERSONAL FACTORS: 3+ comorbidities: OA, HTN, hx of previous CVA  are also affecting patient's functional outcome.   REHAB POTENTIAL: Good  CLINICAL DECISION MAKING: Evolving/moderate complexity  EVALUATION COMPLEXITY: Moderate  PLAN:  PT FREQUENCY: 1-2x/week  PT DURATION: 12 weeks  PLANNED INTERVENTIONS: Therapeutic exercises, Therapeutic activity, Neuromuscular re-education, Balance training, Gait training, Patient/Family education, Self Care, Joint mobilization, Joint manipulation, Stair training, Vestibular training, Canalith repositioning, DME instructions, Dry Needling, Electrical stimulation, Spinal manipulation, Spinal mobilization, Cryotherapy, Moist heat, Taping, and Manual therapy  PLAN FOR NEXT SESSION:  Progress gait training to include dual-cog tasks, BLE endurance & dynamic balance.    Lenda Kelp PT  [facility]  10/22/2022, 3:33 PM  Physical Therapist - [facility]  Outpatient Physical Therapy- Main Campus [phone]

## 2022-10-24 ENCOUNTER — Ambulatory Visit: Payer: Medicare HMO

## 2022-10-24 ENCOUNTER — Ambulatory Visit: Payer: Medicare HMO | Attending: Internal Medicine

## 2022-10-24 DIAGNOSIS — M6281 Muscle weakness (generalized): Secondary | ICD-10-CM | POA: Diagnosis present

## 2022-10-24 DIAGNOSIS — R2681 Unsteadiness on feet: Secondary | ICD-10-CM | POA: Diagnosis not present

## 2022-10-24 DIAGNOSIS — R2689 Other abnormalities of gait and mobility: Secondary | ICD-10-CM | POA: Diagnosis present

## 2022-10-24 DIAGNOSIS — R278 Other lack of coordination: Secondary | ICD-10-CM | POA: Insufficient documentation

## 2022-10-24 DIAGNOSIS — I63511 Cerebral infarction due to unspecified occlusion or stenosis of right middle cerebral artery: Secondary | ICD-10-CM | POA: Diagnosis not present

## 2022-10-24 DIAGNOSIS — R262 Difficulty in walking, not elsewhere classified: Secondary | ICD-10-CM | POA: Diagnosis present

## 2022-10-24 NOTE — Therapy (Signed)
OUTPATIENT PHYSICAL THERAPY NEURO TREATMENT    Patient Name: Teresa [name] MRN: [id number] DOB:[birth year], 82 y.o., female Today's Date: 10/24/2022   PCP: Dr. Aram Beecham REFERRING PROVIDER: Dr. Aram Beecham  END OF SESSION:  PT End of Session - 10/24/22 1105     Visit Number 36    Number of Visits 41    Date for PT Re-Evaluation 11/05/22    Authorization Type [employer] PPO-    Authorization Time Period 05/23/2022- 08/15/2022    Progress Note Due on Visit 40    PT Start Time 1101    PT Stop Time 1143    PT Time Calculation (min) 42 min    Equipment Utilized During Treatment Gait belt    Activity Tolerance Patient tolerated treatment well;No increased pain    Behavior During Therapy WFL for tasks assessed/performed                                Past Medical History:  Diagnosis Date   Cancer (HCC)    skin   Hypothyroidism    Raynaud's disease    Stroke [facility])    Past Surgical History:  Procedure Laterality Date   ABDOMINAL HYSTERECTOMY  1991   APPENDECTOMY  1991   BACK SURGERY  2010   Cervical fusion C4-5-6-7   CATARACT EXTRACTION, BILATERAL Bilateral 10/2016   CHOLECYSTECTOMY  1995   COLONOSCOPY WITH PROPOFOL N/A 12/08/2018   Procedure: COLONOSCOPY WITH PROPOFOL;  Surgeon: Toledo, Boykin Nearing, MD;  Location: [facility] ENDOSCOPY;  Service: Gastroenterology;  Laterality: N/A;   EYE SURGERY     Patient Active Problem List   Diagnosis Date Noted   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   Stroke (HCC) 02/27/2021   History of nonmelanoma skin cancer 05/23/2014    ONSET DATE: 04/16/2022  REFERRING DIAG: 69.354 (ICD-10-CM) - Hemiplegia and hemiparesis following cerebral infarction affecting left non-dominant side   THERAPY DIAG:  Muscle weakness (generalized)  Unsteadiness on feet  Difficulty in walking, not elsewhere classified  Other abnormalities of gait and mobility  Other lack of coordination  Rationale for  Evaluation and Treatment: Rehabilitation  SUBJECTIVE:                                                                                                                                                                                             SUBJECTIVE STATEMENT: Patient reports not sore from last visit. States doing well despite the heat this week.   Pt accompanied by: self  PERTINENT HISTORY: Pt is a 82 y.o. female with  referral for hemiplegia with left sided weakness -original CVA on 02/26/21 and new onset of left sided weakness on 04/16/2022.  R MCA CVA on 02/26/21. Pt completed outpatient PT on 01/17/2022 with majority of goals met. PMH includes: skin cancer, hypothyroidism, and Raynaud's disease, history of cervical fusion in 2010 C4-C7.   PAIN:  Are you having pain?  no  PRECAUTIONS: Fall  WEIGHT BEARING RESTRICTIONS: No  FALLS: Has patient fallen in last 6 months? No    PATIENT GOALS: I want to walk without this cane and no falls and enjoy going out to grandkids sporting event  OBJECTIVE:    TODAY'S TREATMENT:                                                                                                                              DATE: 10/24/22   In the //bars:  TE  High knee march in // bars, CGA, down and back x 6  Step up (from airex pad to 6" step) x 12 reps each LE   Standing ham curl walk - down and back x 4  Standing toe walk- down and back x 4   NMR:   Carioca - down and back without UE support x 6 (VC for technique and mild LOB)   Sit to stand without UE support then singe UE reach overhead  with calf raise "Reach for stars" x 15 reps each LE   Forward lunge walk- in // bars No UE support down and back x 5 CGA,   Walking in tandem  -No UE support x 6 lengths of // bars- required intermittent UE support  Cone activities- Snake around 6 cones- down and back x 4; side step over cones; squat and tap cone x 12,  step toward cone (single leg stance  while opp LE performs hip circle round cone x6 each LE; Pick up cones x 6.     Negative splits (walking without cane)  1) 14.23sec (instructed to walk slower than normal)  2) 11.59 sec (normal pace)  3) 10.58 sec (little quicker than normal pace)  4) 9.89 sec (instructed to walk fast)    PATIENT EDUCATION: Education details: Pt educated throughout session about proper posture and technique with exercises. Improved exercise technique, movement at target joints, use of target muscles after min to mod verbal, visual, tactile cues.  Person educated: Patient Education method: Explanation, Demonstration, Tactile cues, and Verbal cues Education comprehension: verbalized understanding, returned demonstration, verbal cues required, tactile cues required, and needs further education  HOME EXERCISE PROGRAM: Access Code: [email or web address] URL: [email or web address] Date: 10/17/2022 Prepared by: [name]  Exercises - Standing Marching  - 1 x daily - 7 x weekly - 1 sets - 20 reps - Side Stepping with Counter Support  - 1 x daily - 7 x weekly - 1 sets - 20 reps - Standing Tandem Balance with Counter Support  - 1 x daily - 7 x weekly -  2 sets - seconds  hold - Sit to Stand with Arms Crossed  - 1 x daily - 7 x weekly - 2 sets - 10 reps - Heel Raises with Counter Support  - 1 x daily - 7 x weekly - 2 sets - 20 reps  GOALS: Goals reviewed with patient? Yes  SHORT TERM GOALS: Target date: 07/04/2022  Patient will be independent in home exercise program to improve strength/mobility for better functional independence with ADLs  Baseline: EVAL: Patient reports mostly just walking; 07/09/22: Pt reports her exercises are going well, to be updated future date Goal status: IN PROGRESS  LONG TERM GOALS: Target date: 11/05/2022   Pt will decrease 5TSTS by at least 6 seconds in order to demonstrate clinically significant improvement in LE strength. Baseline: EVAL= 23.62 sec without UE;  07/09/22:  13 seconds hands-free  Goal status: MET  2. Pt will improve FOTO to target score of 66 to display perceived improvements in ability to complete ADL's.  Baseline: Eval= 60; 07/09/22: 65 5/21: 59% Goal status: PARTIALLY MET  3.  Pt will decrease TUG to below 14 seconds/decrease in order to demonstrate decreased fall risk. Baseline: Eval= 20.14 sec without AD; 07/09/22: 11 seconds no AD Goal status: MET  4.  Patient will increase six minute walk test distance to >1200 ft for improve gait ability and return to PLOF to complete her walks in her neighborhood.  Baseline: EVAL= 915 feet without an AD; 07/09/22: 1184 ft without AD 5/21: 1060 ft  Goal status: Partially MET  5. Pt will increase by at least 0.13 m/s in order to demonstrate clinically significant improvement in community ambulation.    Baseline: Eval = 0.70 m/s; 07/09/2022: 1.04 m/s without AD   Goal status: MET   6. Pt will report that she has not hit LLE/LUE on doorframe/walls/furniture while ambulating in the past two weeks to indicate improved ability to scan environment and to decrease injury risk with gait.  Baseline: currently has hit furniture/doorframe 2x/week 5/21: hitting doorframe less frequently. 08/23/2022- Patient reports no bumping arms into any walls or furniture since last tested on 08/13/2022. 09/27/2022= Patient reports no bumping arms into any walls or furniture since last tested on 08/23/2022.   Goal status: MET  7. Patient will increase Functional Gait Assessment score to >20/30 as to reduce fall risk and improve dynamic gait safety with community ambulation.  Baseline:5/21: 13/30; 09/27/2022= 19/30   Goal status: PROGRESSING   ASSESSMENT:  CLINICAL IMPRESSION: Patient was challenged today with cone activities and coordination tasks including dynamic activities like Carioca. Each round she improved with confidence and accuracy demonstrating good learning. She did have some difficulty negotiating around  cones but again improved with practice.  The pt will benefit from skilled PT services to improve her overall left LE muscle strength and improve her mobility to decrease her fall risk and improve her overall quality of life.     OBJECTIVE IMPAIRMENTS: Abnormal gait, decreased activity tolerance, decreased balance, decreased coordination, decreased endurance, decreased mobility, difficulty walking, and decreased strength.   ACTIVITY LIMITATIONS: carrying, lifting, bending, standing, squatting, stairs, and transfers  PARTICIPATION LIMITATIONS: cleaning, laundry, driving, shopping, community activity, and yard work  PERSONAL FACTORS: 3+ comorbidities: OA, HTN, hx of previous CVA  are also affecting patient's functional outcome.   REHAB POTENTIAL: Good  CLINICAL DECISION MAKING: Evolving/moderate complexity  EVALUATION COMPLEXITY: Moderate  PLAN:  PT FREQUENCY: 1-2x/week  PT DURATION: 12 weeks  PLANNED INTERVENTIONS: Therapeutic exercises, Therapeutic activity, Neuromuscular  re-education, Balance training, Gait training, Patient/Family education, Self Care, Joint mobilization, Joint manipulation, Stair training, Vestibular training, Canalith repositioning, DME instructions, Dry Needling, Electrical stimulation, Spinal manipulation, Spinal mobilization, Cryotherapy, Moist heat, Taping, and Manual therapy  PLAN FOR NEXT SESSION:  Progress gait training to include dual-cog tasks, BLE endurance & dynamic balance.    Lenda Kelp PT  [facility]  10/24/2022, 11:49 AM   Physical Therapist - [facility]  Outpatient Physical Therapy- Main Campus [phone]

## 2022-10-29 ENCOUNTER — Ambulatory Visit: Payer: Medicare HMO

## 2022-10-29 DIAGNOSIS — R278 Other lack of coordination: Secondary | ICD-10-CM

## 2022-10-29 DIAGNOSIS — R2689 Other abnormalities of gait and mobility: Secondary | ICD-10-CM | POA: Diagnosis not present

## 2022-10-29 DIAGNOSIS — R2681 Unsteadiness on feet: Secondary | ICD-10-CM | POA: Diagnosis not present

## 2022-10-29 DIAGNOSIS — I63511 Cerebral infarction due to unspecified occlusion or stenosis of right middle cerebral artery: Secondary | ICD-10-CM | POA: Diagnosis not present

## 2022-10-29 DIAGNOSIS — R262 Difficulty in walking, not elsewhere classified: Secondary | ICD-10-CM

## 2022-10-29 DIAGNOSIS — M6281 Muscle weakness (generalized): Secondary | ICD-10-CM | POA: Diagnosis not present

## 2022-10-29 NOTE — Therapy (Signed)
OUTPATIENT PHYSICAL THERAPY NEURO TREATMENT    Patient Name: Teresa [name] MRN: [id number] DOB:[birth year], 82 y.o., female Today's Date: 10/29/2022   PCP: Dr. Aram Beecham REFERRING PROVIDER: Dr. Aram Beecham  END OF SESSION:  PT End of Session - 10/29/22 1149     Visit Number 37    Number of Visits 41    Date for PT Re-Evaluation 11/05/22    Authorization Type [employer] PPO-    Authorization Time Period 05/23/2022- 08/15/2022    Progress Note Due on Visit 40    PT Start Time 1151    PT Stop Time 1227    PT Time Calculation (min) 36 min    Equipment Utilized During Treatment Gait belt    Activity Tolerance Patient tolerated treatment well;No increased pain    Behavior During Therapy WFL for tasks assessed/performed                                Past Medical History:  Diagnosis Date   Cancer (HCC)    skin   Hypothyroidism    Raynaud's disease    Stroke [facility])    Past Surgical History:  Procedure Laterality Date   ABDOMINAL HYSTERECTOMY  1991   APPENDECTOMY  1991   BACK SURGERY  2010   Cervical fusion C4-5-6-7   CATARACT EXTRACTION, BILATERAL Bilateral 10/2016   CHOLECYSTECTOMY  1995   COLONOSCOPY WITH PROPOFOL N/A 12/08/2018   Procedure: COLONOSCOPY WITH PROPOFOL;  Surgeon: Toledo, Boykin Nearing, MD;  Location: [facility] ENDOSCOPY;  Service: Gastroenterology;  Laterality: N/A;   EYE SURGERY     Patient Active Problem List   Diagnosis Date Noted   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   Stroke (HCC) 02/27/2021   History of nonmelanoma skin cancer 05/23/2014    ONSET DATE: 04/16/2022  REFERRING DIAG: 69.354 (ICD-10-CM) - Hemiplegia and hemiparesis following cerebral infarction affecting left non-dominant side   THERAPY DIAG:  Unsteadiness on feet  Difficulty in walking, not elsewhere classified  Other lack of coordination  Rationale for Evaluation and Treatment: Rehabilitation  SUBJECTIVE:                                                                                                                                                                                              SUBJECTIVE STATEMENT: Pt reports no stumbles/falls, no bumping into things with gait. Pt reports headache, thinks it has to do with change in the weather. Pt reports walked in [employer] the other day pushing a cart and completed 3000 steps.  Pt accompanied by:  self  PERTINENT HISTORY: Pt is a 82 y.o. female with referral for hemiplegia with left sided weakness -original CVA on 02/26/21 and new onset of left sided weakness on 04/16/2022.  R MCA CVA on 02/26/21. Pt completed outpatient PT on 01/17/2022 with majority of goals met. PMH includes: skin cancer, hypothyroidism, and Raynaud's disease, history of cervical fusion in 2010 C4-C7.   PAIN:  Are you having pain?  no  PRECAUTIONS: Fall  WEIGHT BEARING RESTRICTIONS: No  FALLS: Has patient fallen in last 6 months? No    PATIENT GOALS: I want to walk without this cane and no falls and enjoy going out to grandkids sporting event  OBJECTIVE:    TODAY'S TREATMENT:                                                                                                                              DATE: 10/29/22   Gait belt donned throughout to ensure pt safety, CGA provided unless specified otherwise  NMR:   High knee march for SLB progression at support surface 20x alt LE  - easy  High knee march over 10 meters 2x - easy, cuing for decreased speed  Tandem gait over 10 meters with and without dual cog task x multiple reps of each - very difficult with dual cog task, CGA-min a but pt is able to consistently catch herself with step-strategy   Multiple reps of the following over 10 meters: Gait with EC Retro-gait  Gait with vertical head turns Gait with horizontal head turns  Ambulating around cones (6)  Comments: pt reports only challenging one was completing gait with EC    Ambulating around 6 cones with and without dual cog task - only displaces two cones with last round with increased LE fatigue  Ambulating with toe tap onto cones x 2 rounds each LE    PATIENT EDUCATION: Education details: Pt educated throughout session about proper posture and technique with exercises. Improved exercise technique, movement at target joints, use of target muscles after min to mod verbal, visual, tactile cues.  Person educated: Patient Education method: Explanation, Demonstration, Tactile cues, and Verbal cues Education comprehension: verbalized understanding, returned demonstration, verbal cues required, tactile cues required, and needs further education  HOME EXERCISE PROGRAM: Access Code: [email or web address] URL: [email or web address] Date: 10/17/2022 Prepared by: [name]  Exercises - Standing Marching  - 1 x daily - 7 x weekly - 1 sets - 20 reps - Side Stepping with Counter Support  - 1 x daily - 7 x weekly - 1 sets - 20 reps - Standing Tandem Balance with Counter Support  - 1 x daily - 7 x weekly - 2 sets - seconds  hold - Sit to Stand with Arms Crossed  - 1 x daily - 7 x weekly - 2 sets - 10 reps - Heel Raises with Counter Support  - 1 x daily - 7 x weekly - 2 sets - 20 reps  GOALS: Goals reviewed with patient? Yes  SHORT TERM GOALS: Target date: 07/04/2022  Patient will be independent in home exercise program to improve strength/mobility for better functional independence with ADLs  Baseline: EVAL: Patient reports mostly just walking; 07/09/22: Pt reports her exercises are going well, to be updated future date Goal status: IN PROGRESS  LONG TERM GOALS: Target date: 11/05/2022   Pt will decrease 5TSTS by at least 6 seconds in order to demonstrate clinically significant improvement in LE strength. Baseline: EVAL= 23.62 sec without UE; 07/09/22:  13 seconds hands-free  Goal status: MET  2. Pt will improve FOTO to target score of 66 to display  perceived improvements in ability to complete ADL's.  Baseline: Eval= 60; 07/09/22: 65 5/21: 59% Goal status: PARTIALLY MET  3.  Pt will decrease TUG to below 14 seconds/decrease in order to demonstrate decreased fall risk. Baseline: Eval= 20.14 sec without AD; 07/09/22: 11 seconds no AD Goal status: MET  4.  Patient will increase six minute walk test distance to >1200 ft for improve gait ability and return to PLOF to complete her walks in her neighborhood.  Baseline: EVAL= 915 feet without an AD; 07/09/22: 1184 ft without AD 5/21: 1060 ft  Goal status: Partially MET  5. Pt will increase by at least 0.13 m/s in order to demonstrate clinically significant improvement in community ambulation.    Baseline: Eval = 0.70 m/s; 07/09/2022: 1.04 m/s without AD   Goal status: MET   6. Pt will report that she has not hit LLE/LUE on doorframe/walls/furniture while ambulating in the past two weeks to indicate improved ability to scan environment and to decrease injury risk with gait.  Baseline: currently has hit furniture/doorframe 2x/week 5/21: hitting doorframe less frequently. 08/23/2022- Patient reports no bumping arms into any walls or furniture since last tested on 08/13/2022. 09/27/2022= Patient reports no bumping arms into any walls or furniture since last tested on 08/23/2022.   Goal status: MET  7. Patient will increase Functional Gait Assessment score to >20/30 as to reduce fall risk and improve dynamic gait safety with community ambulation.  Baseline:5/21: 13/30; 09/27/2022= 19/30   Goal status: PROGRESSING   ASSESSMENT:  CLINICAL IMPRESSION: Session focused on completing FGA activities to challenge pt's dynamic balance. Pt generally rates majority of interventions as easy with good ability to self-correct for decrease in postural stability. Pt has most trouble with tandem gait on this date. The pt will benefit from skilled PT services to improve her overall left LE muscle strength and improve  her mobility to decrease her fall risk and improve her overall quality of life.     OBJECTIVE IMPAIRMENTS: Abnormal gait, decreased activity tolerance, decreased balance, decreased coordination, decreased endurance, decreased mobility, difficulty walking, and decreased strength.   ACTIVITY LIMITATIONS: carrying, lifting, bending, standing, squatting, stairs, and transfers  PARTICIPATION LIMITATIONS: cleaning, laundry, driving, shopping, community activity, and yard work  PERSONAL FACTORS: 3+ comorbidities: OA, HTN, hx of previous CVA  are also affecting patient's functional outcome.   REHAB POTENTIAL: Good  CLINICAL DECISION MAKING: Evolving/moderate complexity  EVALUATION COMPLEXITY: Moderate  PLAN:  PT FREQUENCY: 1-2x/week  PT DURATION: 12 weeks  PLANNED INTERVENTIONS: Therapeutic exercises, Therapeutic activity, Neuromuscular re-education, Balance training, Gait training, Patient/Family education, Self Care, Joint mobilization, Joint manipulation, Stair training, Vestibular training, Canalith repositioning, DME instructions, Dry Needling, Electrical stimulation, Spinal manipulation, Spinal mobilization, Cryotherapy, Moist heat, Taping, and Manual therapy  PLAN FOR NEXT SESSION:  Progress gait training to include dual-cog tasks, BLE endurance &  dynamic balance.    Baird Kay PT  [facility]  10/29/2022, 12:45 PM   Physical Therapist - [facility]  Outpatient Physical Therapy- Main Campus [phone]

## 2022-10-30 DIAGNOSIS — Z8673 Personal history of transient ischemic attack (TIA), and cerebral infarction without residual deficits: Secondary | ICD-10-CM | POA: Diagnosis not present

## 2022-10-30 DIAGNOSIS — I69354 Hemiplegia and hemiparesis following cerebral infarction affecting left non-dominant side: Secondary | ICD-10-CM | POA: Diagnosis not present

## 2022-10-30 DIAGNOSIS — G8194 Hemiplegia, unspecified affecting left nondominant side: Secondary | ICD-10-CM | POA: Diagnosis not present

## 2022-10-31 ENCOUNTER — Ambulatory Visit: Payer: Medicare HMO

## 2022-10-31 DIAGNOSIS — R2681 Unsteadiness on feet: Secondary | ICD-10-CM

## 2022-10-31 DIAGNOSIS — R262 Difficulty in walking, not elsewhere classified: Secondary | ICD-10-CM | POA: Diagnosis not present

## 2022-10-31 DIAGNOSIS — R278 Other lack of coordination: Secondary | ICD-10-CM

## 2022-10-31 DIAGNOSIS — R2689 Other abnormalities of gait and mobility: Secondary | ICD-10-CM | POA: Diagnosis not present

## 2022-10-31 DIAGNOSIS — M6281 Muscle weakness (generalized): Secondary | ICD-10-CM

## 2022-10-31 DIAGNOSIS — I63511 Cerebral infarction due to unspecified occlusion or stenosis of right middle cerebral artery: Secondary | ICD-10-CM | POA: Diagnosis not present

## 2022-10-31 NOTE — Therapy (Signed)
OUTPATIENT PHYSICAL THERAPY TREATMENT    Patient Name: Teresa [name] MRN: [id number] DOB:01-09-1941, 82 y.o., female Today's Date: 10/31/2022   PCP: Dr. Aram Beecham REFERRING PROVIDER: Dr. Aram Beecham  END OF SESSION:  PT End of Session - 10/31/22 1110     Visit Number 38    Number of Visits 41    Date for PT Re-Evaluation 11/05/22    Authorization Type [employer] PPO-    Authorization Time Period 08/13/22-11/05/22    Progress Note Due on Visit 40    PT Start Time 1100    PT Stop Time 1140    PT Time Calculation (min) 40 min    Equipment Utilized During Treatment Gait belt    Activity Tolerance Patient tolerated treatment well;No increased pain    Behavior During Therapy WFL for tasks assessed/performed              Past Medical History:  Diagnosis Date   Cancer (HCC)    skin   Hypothyroidism    Raynaud's disease    Stroke [facility])    Past Surgical History:  Procedure Laterality Date   ABDOMINAL HYSTERECTOMY  1991   APPENDECTOMY  1991   BACK SURGERY  2010   Cervical fusion C4-5-6-7   CATARACT EXTRACTION, BILATERAL Bilateral 10/2016   CHOLECYSTECTOMY  1995   COLONOSCOPY WITH PROPOFOL N/A 12/08/2018   Procedure: COLONOSCOPY WITH PROPOFOL;  Surgeon: Toledo, Boykin Nearing, MD;  Location: [facility] ENDOSCOPY;  Service: Gastroenterology;  Laterality: N/A;   EYE SURGERY     Patient Active Problem List   Diagnosis Date Noted   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   Stroke (HCC) 02/27/2021   History of nonmelanoma skin cancer 05/23/2014    ONSET DATE: 04/16/2022  REFERRING DIAG: 69.354 (ICD-10-CM) - Hemiplegia and hemiparesis following cerebral infarction affecting left non-dominant side   THERAPY DIAG:  Unsteadiness on feet  Difficulty in walking, not elsewhere classified  Muscle weakness (generalized)  Other lack of coordination  Rationale for Evaluation and Treatment: Rehabilitation  SUBJECTIVE:                                                                                                                                                                                              SUBJECTIVE STATEMENT: Doing well. Saw Dr. Neuro yesterday, he wants her to keep coming to PT/OT.   Pt accompanied by: self  PERTINENT HISTORY: Pt is a 82 y.o. female with referral for hemiplegia with left sided weakness -original CVA on 02/26/21 and new onset of left sided weakness on 04/16/2022.  R MCA CVA on 02/26/21. Pt completed  outpatient PT on 01/17/2022 with majority of goals met. PMH includes: skin cancer, hypothyroidism, and Raynaud's disease, history of cervical fusion in 2010 C4-C7.   PAIN:  Are you having pain?  no  PRECAUTIONS: Fall  WEIGHT BEARING RESTRICTIONS: No  FALLS: Has patient fallen in last 6 months? No   PATIENT GOALS: I want to walk without this cane and no falls and enjoy going out to grandkids sporting event  OBJECTIVE:    TODAY'S TREATMENT:                                                                                                                              DATE: 10/31/22  -C 3lb AW bilat and a yellow TB at thighs: alterante fwd high knees and backward stepping 5x in //bars, then side stepping in bars 5x bilat  08/13/22-11/05/22  -Standing toe taps forward, lateral and medial x25 bilat  -12.5lb cable restricted AMB 2x each of 4 directions, challenge with speed control in eccentric phase for all   -85ft AMB obstacle course c 3lb AW bilat and 8 object transfer (variable weight, variable height)    PATIENT EDUCATION: Education details: Pt educated throughout session about proper posture and technique with exercises. Improved exercise technique, movement at target joints, use of target muscles after min to mod verbal, visual, tactile cues.  Person educated: Patient Education method: Explanation, Demonstration, Tactile cues, and Verbal cues Education comprehension: verbalized understanding, returned  demonstration, verbal cues required, tactile cues required, and needs further education  HOME EXERCISE PROGRAM: Access Code: [email or web address] URL: [email or web address] Date: 10/17/2022 Prepared by: [name]  Exercises - Standing Marching  - 1 x daily - 7 x weekly - 1 sets - 20 reps - Side Stepping with Counter Support  - 1 x daily - 7 x weekly - 1 sets - 20 reps - Standing Tandem Balance with Counter Support  - 1 x daily - 7 x weekly - 2 sets - seconds  hold - Sit to Stand with Arms Crossed  - 1 x daily - 7 x weekly - 2 sets - 10 reps - Heel Raises with Counter Support  - 1 x daily - 7 x weekly - 2 sets - 20 reps  GOALS: Goals reviewed with patient? Yes  SHORT TERM GOALS: Target date: 07/04/2022  Patient will be independent in home exercise program to improve strength/mobility for better functional independence with ADLs  Baseline: EVAL: Patient reports mostly just walking; 07/09/22: Pt reports her exercises are going well, to be updated future date Goal status: IN PROGRESS  LONG TERM GOALS: Target date: 11/05/2022   Pt will decrease 5TSTS by at least 6 seconds in order to demonstrate clinically significant improvement in LE strength. Baseline: EVAL= 23.62 sec without UE; 07/09/22:  13 seconds hands-free  Goal status: MET  2. Pt will improve FOTO to target score of 66 to display perceived improvements in ability to complete ADL's.  Baseline: Eval= 60; 07/09/22: 65  5/21: 59% Goal status: PARTIALLY MET  3.  Pt will decrease TUG to below 14 seconds/decrease in order to demonstrate decreased fall risk. Baseline: Eval= 20.14 sec without AD; 07/09/22: 11 seconds no AD Goal status: MET  4.  Patient will increase six minute walk test distance to >1200 ft for improve gait ability and return to PLOF to complete her walks in her neighborhood.  Baseline: EVAL= 915 feet without an AD; 07/09/22: 1184 ft without AD 5/21: 1060 ft  Goal status: Partially MET  5. Pt will increase  by at least 0.13 m/s in order to demonstrate clinically significant improvement in community ambulation.    Baseline: Eval = 0.70 m/s; 07/09/2022: 1.04 m/s without AD   Goal status: MET   6. Pt will report that she has not hit LLE/LUE on doorframe/walls/furniture while ambulating in the past two weeks to indicate improved ability to scan environment and to decrease injury risk with gait.  Baseline: currently has hit furniture/doorframe 2x/week 5/21: hitting doorframe less frequently. 08/23/2022- Patient reports no bumping arms into any walls or furniture since last tested on 08/13/2022. 09/27/2022= Patient reports no bumping arms into any walls or furniture since last tested on 08/23/2022.   Goal status: MET  7. Patient will increase Functional Gait Assessment score to >20/30 as to reduce fall risk and improve dynamic gait safety with community ambulation.  Baseline:5/21: 13/30; 09/27/2022= 19/30   Goal status: PROGRESSING   ASSESSMENT:  CLINICAL IMPRESSION: Session heavy in multi-system, dual to triple tasking interventions. Pt rises to occasion, more LOB. Cues given to use stepping righting strategy when able. The pt will benefit from skilled PT services to improve her overall left LE muscle strength and improve her mobility to decrease her fall risk and improve her overall quality of life.     OBJECTIVE IMPAIRMENTS: Abnormal gait, decreased activity tolerance, decreased balance, decreased coordination, decreased endurance, decreased mobility, difficulty walking, and decreased strength.   ACTIVITY LIMITATIONS: carrying, lifting, bending, standing, squatting, stairs, and transfers  PARTICIPATION LIMITATIONS: cleaning, laundry, driving, shopping, community activity, and yard work  PERSONAL FACTORS: 3+ comorbidities: OA, HTN, hx of previous CVA  are also affecting patient's functional outcome.   REHAB POTENTIAL: Good  CLINICAL DECISION MAKING: Evolving/moderate complexity  EVALUATION  COMPLEXITY: Moderate  PLAN:  PT FREQUENCY: 1-2x/week  PT DURATION: 12 weeks  PLANNED INTERVENTIONS: Therapeutic exercises, Therapeutic activity, Neuromuscular re-education, Balance training, Gait training, Patient/Family education, Self Care, Joint mobilization, Joint manipulation, Stair training, Vestibular training, Canalith repositioning, DME instructions, Dry Needling, Electrical stimulation, Spinal manipulation, Spinal mobilization, Cryotherapy, Moist heat, Taping, and Manual therapy  PLAN FOR NEXT SESSION:  Progress gait training to include dual-cog tasks, BLE endurance & dynamic balance.    Rosamaria Lints PT   11:47 AM, 10/31/22 Rosamaria Lints, PT, DPT Physical Therapist - [facility]  [phone] [facility])   [facility]  10/31/2022, 11:14 AM   Physical Therapist - [facility]  Outpatient Physical Therapy- Main Campus [phone]

## 2022-11-05 ENCOUNTER — Ambulatory Visit: Payer: Medicare HMO

## 2022-11-05 DIAGNOSIS — I63511 Cerebral infarction due to unspecified occlusion or stenosis of right middle cerebral artery: Secondary | ICD-10-CM | POA: Diagnosis not present

## 2022-11-05 DIAGNOSIS — R2681 Unsteadiness on feet: Secondary | ICD-10-CM

## 2022-11-05 DIAGNOSIS — M6281 Muscle weakness (generalized): Secondary | ICD-10-CM

## 2022-11-05 DIAGNOSIS — R262 Difficulty in walking, not elsewhere classified: Secondary | ICD-10-CM

## 2022-11-05 DIAGNOSIS — R2689 Other abnormalities of gait and mobility: Secondary | ICD-10-CM | POA: Diagnosis not present

## 2022-11-05 DIAGNOSIS — R278 Other lack of coordination: Secondary | ICD-10-CM | POA: Diagnosis not present

## 2022-11-05 NOTE — Therapy (Incomplete)
OUTPATIENT PHYSICAL THERAPY TREATMENT/RECERT    Patient Name: Teresa [name] MRN: [id number] DOB:1940-06-29, 82 y.o., female Today's Date: 11/06/2022   PCP: Dr. Aram Beecham REFERRING PROVIDER: Dr. Aram Beecham  END OF SESSION:  PT End of Session - 11/05/22 1153     Visit Number 39    Number of Visits 63    Date for PT Re-Evaluation 01/28/23    Authorization Type [employer] PPO-    Authorization Time Period 08/13/22-11/05/22    Progress Note Due on Visit 40    PT Start Time 1147    PT Stop Time 1228    PT Time Calculation (min) 41 min    Equipment Utilized During Treatment Gait belt    Activity Tolerance Patient tolerated treatment well;No increased pain    Behavior During Therapy WFL for tasks assessed/performed               Past Medical History:  Diagnosis Date   Cancer (HCC)    skin   Hypothyroidism    Raynaud's disease    Stroke [facility])    Past Surgical History:  Procedure Laterality Date   ABDOMINAL HYSTERECTOMY  1991   APPENDECTOMY  1991   BACK SURGERY  2010   Cervical fusion C4-5-6-7   CATARACT EXTRACTION, BILATERAL Bilateral 10/2016   CHOLECYSTECTOMY  1995   COLONOSCOPY WITH PROPOFOL N/A 12/08/2018   Procedure: COLONOSCOPY WITH PROPOFOL;  Surgeon: Toledo, Boykin Nearing, MD;  Location: [facility] ENDOSCOPY;  Service: Gastroenterology;  Laterality: N/A;   EYE SURGERY     Patient Active Problem List   Diagnosis Date Noted   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   Stroke (HCC) 02/27/2021   History of nonmelanoma skin cancer 05/23/2014    ONSET DATE: 04/16/2022  REFERRING DIAG: 69.354 (ICD-10-CM) - Hemiplegia and hemiparesis following cerebral infarction affecting left non-dominant side   THERAPY DIAG:  Unsteadiness on feet  Difficulty in walking, not elsewhere classified  Muscle weakness (generalized)  Other lack of coordination  Other abnormalities of gait and mobility  Rationale for Evaluation and Treatment:  Rehabilitation  SUBJECTIVE:                                                                                                                                                                                             SUBJECTIVE STATEMENT: Patient reports she feels like she still has some room for improvement in her overall strength and balance- States when done with PT would like to join gym but would need some guidance.   Pt accompanied by: self  PERTINENT HISTORY: Pt is a 82 y.o. female with referral  for hemiplegia with left sided weakness -original CVA on 02/26/21 and new onset of left sided weakness on 04/16/2022.  R MCA CVA on 02/26/21. Pt completed outpatient PT on 01/17/2022 with majority of goals met. PMH includes: skin cancer, hypothyroidism, and Raynaud's disease, history of cervical fusion in 2010 C4-C7.   PAIN:  Are you having pain?  no  PRECAUTIONS: Fall  WEIGHT BEARING RESTRICTIONS: No  FALLS: Has patient fallen in last 6 months? No   PATIENT GOALS: I want to walk without this cane and no falls and enjoy going out to grandkids sporting event  OBJECTIVE:    TODAY'S TREATMENT:                                                                                                                              DATE: 11/05/2022  Physical therapy treatment session today consisted of completing assessment of goals and administration of testing as demonstrated and documented in flow sheet, treatment, and goals section of this note. Addition treatments may be found below.    PATIENT EDUCATION: Education details: Pt educated throughout session about proper posture and technique with exercises. Improved exercise technique, movement at target joints, use of target muscles after min to mod verbal, visual, tactile cues.  Person educated: Patient Education method: Explanation, Demonstration, Tactile cues, and Verbal cues Education comprehension: verbalized understanding, returned demonstration,  verbal cues required, tactile cues required, and needs further education  HOME EXERCISE PROGRAM: Access Code: [email or web address] URL: [email or web address] Date: 10/17/2022 Prepared by: [name]  Exercises - Standing Marching  - 1 x daily - 7 x weekly - 1 sets - 20 reps - Side Stepping with Counter Support  - 1 x daily - 7 x weekly - 1 sets - 20 reps - Standing Tandem Balance with Counter Support  - 1 x daily - 7 x weekly - 2 sets - seconds  hold - Sit to Stand with Arms Crossed  - 1 x daily - 7 x weekly - 2 sets - 10 reps - Heel Raises with Counter Support  - 1 x daily - 7 x weekly - 2 sets - 20 reps  GOALS: Goals reviewed with patient? Yes  SHORT TERM GOALS: Target date: 07/04/2022  Patient will be independent in home exercise program to improve strength/mobility for better functional independence with ADLs  Baseline: EVAL: Patient reports mostly just walking; 07/09/22: Pt reports her exercises are going well, to be updated future date; 11/05/2022- Will now work with patient on HEP in gym setting Goal status: IN PROGRESS  LONG TERM GOALS: Target date: 11/05/2022   Pt will decrease 5TSTS by at least 6 seconds in order to demonstrate clinically significant improvement in LE strength. Baseline: EVAL= 23.62 sec without UE; 07/09/22:  13 seconds hands-free  Goal status: MET  2. Pt will improve FOTO to target score of 66 to display perceived improvements in ability to complete ADL's.  Baseline: Eval= 60; 07/09/22: 65 5/21: 59% 11/05/2022=  72 Goal status:  MET  3.  Pt will decrease TUG to below 14 seconds/decrease in order to demonstrate decreased fall risk. Baseline: Eval= 20.14 sec without AD; 07/09/22: 11 seconds no AD Goal status: MET  4.  Patient will increase six minute walk test distance to >1200 ft for improve gait ability and return to PLOF to complete her walks in her neighborhood.  Baseline: EVAL= 915 feet without an AD; 07/09/22: 1184 ft without AD 5/21: 1060 ft;  11/05/2022= 1175 feet without an AD Goal status: Partially MET  5. Pt will increase by at least 0.13 m/s in order to demonstrate clinically significant improvement in community ambulation.    Baseline: Eval = 0.70 m/s; 07/09/2022: 1.04 m/s without AD   Goal status: MET   6. Pt will report that she has not hit LLE/LUE on doorframe/walls/furniture while ambulating in the past two weeks to indicate improved ability to scan environment and to decrease injury risk with gait.  Baseline: currently has hit furniture/doorframe 2x/week 5/21: hitting doorframe less frequently. 08/23/2022- Patient reports no bumping arms into any walls or furniture since last tested on 08/13/2022. 09/27/2022= Patient reports no bumping arms into any walls or furniture since last tested on 08/23/2022.   Goal status: MET  7. Patient will increase Functional Gait Assessment score to >20/30 as to reduce fall risk and improve dynamic gait safety with community ambulation.  Baseline:5/21: 13/30; 09/27/2022= 19/30; 11/05/2022= 26/30   Goal status: MET  8. Patient will transition from skilled PT services to independent with gym based exercise program to continue with her gains made in PT and perform at an independent level for optimal safety and improved quality of life.   Baseline: 11/05/2022= Patient with no knowledge of Strengthening or balance exercises in gym based  Setting.   Goal status: INITIAL  ASSESSMENT:  CLINICAL IMPRESSION: Patient presents with good motivation for today's recert visit. She again demonstrated good progress during this certification and now achieved her established balance goal as well as FOTO goal- exhibiting improved self perceived ability. She still presents with some unsteadiness at times and does not always clear her foot well as observed in six min walk test. She could benefit from further LE strengthening and expressed desire to transition to local gym setting. She will benefit from another  certification period focusing on higher level balance training and instruction in gym exercises/equipment to promote continued strengthening and safety to decrease risk of possible injury. Patient's condition has the potential to improve in response to therapy. Maximum improvement is yet to be obtained. The anticipated improvement is attainable and reasonable in a generally predictable time. The pt will benefit from skilled PT services to improve her overall left LE muscle strength and improve her mobility to decrease her fall risk and improve her overall quality of life.         OBJECTIVE IMPAIRMENTS: Abnormal gait, decreased activity tolerance, decreased balance, decreased coordination, decreased endurance, decreased mobility, difficulty walking, and decreased strength.   ACTIVITY LIMITATIONS: carrying, lifting, bending, standing, squatting, stairs, and transfers  PARTICIPATION LIMITATIONS: cleaning, laundry, driving, shopping, community activity, and yard work  PERSONAL FACTORS: 3+ comorbidities: OA, HTN, hx of previous CVA  are also affecting patient's functional outcome.   REHAB POTENTIAL: Good  CLINICAL DECISION MAKING: Evolving/moderate complexity  EVALUATION COMPLEXITY: Moderate  PLAN:  PT FREQUENCY: 1-2x/week  PT DURATION: 12 weeks  PLANNED INTERVENTIONS: Therapeutic exercises, Therapeutic activity, Neuromuscular re-education, Balance training, Gait training, Patient/Family education, Self Care, Joint mobilization, Joint  manipulation, Stair training, Vestibular training, Canalith repositioning, DME instructions, Dry Needling, Electrical stimulation, Spinal manipulation, Spinal mobilization, Cryotherapy, Moist heat, Taping, and Manual therapy  PLAN FOR NEXT SESSION:  Progress BLE strength & dynamic balance. Initiate training in well zone next visit.    Lenda Kelp PT   12:51 PM, 11/06/22 Louis Meckel, PT Physical Therapist - [facility]  [phone] [facility])   [facility]  11/06/2022, 12:51 PM   Physical Therapist - [facility]  Outpatient Physical Therapy- Main Campus [phone]

## 2022-11-07 ENCOUNTER — Ambulatory Visit: Payer: Medicare HMO

## 2022-11-07 DIAGNOSIS — E039 Hypothyroidism, unspecified: Secondary | ICD-10-CM | POA: Diagnosis not present

## 2022-11-07 DIAGNOSIS — M6281 Muscle weakness (generalized): Secondary | ICD-10-CM | POA: Diagnosis not present

## 2022-11-07 DIAGNOSIS — I63511 Cerebral infarction due to unspecified occlusion or stenosis of right middle cerebral artery: Secondary | ICD-10-CM | POA: Diagnosis not present

## 2022-11-07 DIAGNOSIS — R2681 Unsteadiness on feet: Secondary | ICD-10-CM

## 2022-11-07 DIAGNOSIS — E782 Mixed hyperlipidemia: Secondary | ICD-10-CM | POA: Diagnosis not present

## 2022-11-07 DIAGNOSIS — I10 Essential (primary) hypertension: Secondary | ICD-10-CM | POA: Diagnosis not present

## 2022-11-07 DIAGNOSIS — R262 Difficulty in walking, not elsewhere classified: Secondary | ICD-10-CM

## 2022-11-07 DIAGNOSIS — R278 Other lack of coordination: Secondary | ICD-10-CM

## 2022-11-07 DIAGNOSIS — R2689 Other abnormalities of gait and mobility: Secondary | ICD-10-CM | POA: Diagnosis not present

## 2022-11-07 DIAGNOSIS — Z79899 Other long term (current) drug therapy: Secondary | ICD-10-CM | POA: Diagnosis not present

## 2022-11-07 NOTE — Therapy (Signed)
OUTPATIENT PHYSICAL THERAPY TREATMENT/Physical Therapy Progress Note   Dates of reporting period  09/27/2022   to   11/07/2022     Patient Name: Teresa [name] MRN: [id number] DOB:03/03/1941, 82 y.o., female Today's Date: 11/07/2022   PCP: Dr. Aram Beecham REFERRING PROVIDER: Dr. Aram Beecham  END OF SESSION:  PT End of Session - 11/07/22 1105     Visit Number 40    Number of Visits 63    Date for PT Re-Evaluation 01/28/23    Authorization Type [employer] PPO-    Authorization Time Period 08/13/22-11/05/22    Progress Note Due on Visit 40    PT Start Time 1100    Equipment Utilized During Treatment Gait belt    Activity Tolerance Patient tolerated treatment well;No increased pain    Behavior During Therapy WFL for tasks assessed/performed               Past Medical History:  Diagnosis Date   Cancer (HCC)    skin   Hypothyroidism    Raynaud's disease    Stroke [facility])    Past Surgical History:  Procedure Laterality Date   ABDOMINAL HYSTERECTOMY  1991   APPENDECTOMY  1991   BACK SURGERY  2010   Cervical fusion C4-5-6-7   CATARACT EXTRACTION, BILATERAL Bilateral 10/2016   CHOLECYSTECTOMY  1995   COLONOSCOPY WITH PROPOFOL N/A 12/08/2018   Procedure: COLONOSCOPY WITH PROPOFOL;  Surgeon: Toledo, Boykin Nearing, MD;  Location: [facility] ENDOSCOPY;  Service: Gastroenterology;  Laterality: N/A;   EYE SURGERY     Patient Active Problem List   Diagnosis Date Noted   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   Stroke (HCC) 02/27/2021   History of nonmelanoma skin cancer 05/23/2014    ONSET DATE: 04/16/2022  REFERRING DIAG: 69.354 (ICD-10-CM) - Hemiplegia and hemiparesis following cerebral infarction affecting left non-dominant side   THERAPY DIAG:  Unsteadiness on feet  Difficulty in walking, not elsewhere classified  Muscle weakness (generalized)  Other lack of coordination  Other abnormalities of gait and mobility  Right middle cerebral  artery stroke (HCC)  Rationale for Evaluation and Treatment: Rehabilitation  SUBJECTIVE:                                                                                                                                                                                             SUBJECTIVE STATEMENT: Patient reports doing well without any new issues. Husband reports he noticed she is having difficulty getting out of car.  Pt accompanied by: Husband  PERTINENT HISTORY: Pt is a 82 y.o. female with referral for hemiplegia with left sided  weakness -original CVA on 02/26/21 and new onset of left sided weakness on 04/16/2022.  R MCA CVA on 02/26/21. Pt completed outpatient PT on 01/17/2022 with majority of goals met. PMH includes: skin cancer, hypothyroidism, and Raynaud's disease, history of cervical fusion in 2010 C4-C7.   PAIN:  Are you having pain?  no  PRECAUTIONS: Fall  WEIGHT BEARING RESTRICTIONS: No  FALLS: Has patient fallen in last 6 months? No   PATIENT GOALS: I want to walk without this cane and no falls and enjoy going out to grandkids sporting event  OBJECTIVE:    TODAY'S TREATMENT:                                                                                                                              DATE: 11/07/2022  Instruction in well zone exercises  Leg press- L2 x 12 reps BLE, single leg at L4 x 12 reps x 2 set ea LE Calf press -L2 x 12 reps BLE, single leg at L4 x 12 reps x 2 set ea LE Knee flex- L2 x 12 reps x 3 sets (single leg) x each LE Knee ext- L2 x 12 reps x 3 sets (single leg) x each LE   PATIENT EDUCATION: Education details: Pt educated throughout session about proper posture and technique with exercises. Improved exercise technique, movement at target joints, use of target muscles after min to mod verbal, visual, tactile cues.  Person educated: Patient Education method: Explanation, Demonstration, Tactile cues, and Verbal cues Education comprehension:  verbalized understanding, returned demonstration, verbal cues required, tactile cues required, and needs further education  HOME EXERCISE PROGRAM: Access Code: [email or web address] URL: [email or web address] Date: 10/17/2022 Prepared by: [name]  Exercises - Standing Marching  - 1 x daily - 7 x weekly - 1 sets - 20 reps - Side Stepping with Counter Support  - 1 x daily - 7 x weekly - 1 sets - 20 reps - Standing Tandem Balance with Counter Support  - 1 x daily - 7 x weekly - 2 sets - seconds  hold - Sit to Stand with Arms Crossed  - 1 x daily - 7 x weekly - 2 sets - 10 reps - Heel Raises with Counter Support  - 1 x daily - 7 x weekly - 2 sets - 20 reps  GOALS: Goals reviewed with patient? Yes  SHORT TERM GOALS: Target date: 07/04/2022  Patient will be independent in home exercise program to improve strength/mobility for better functional independence with ADLs  Baseline: EVAL: Patient reports mostly just walking; 07/09/22: Pt reports her exercises are going well, to be updated future date; 11/05/2022- Will now work with patient on HEP in gym setting Goal status: IN PROGRESS  LONG TERM GOALS: Target date: 11/05/2022   Pt will decrease 5TSTS by at least 6 seconds in order to demonstrate clinically significant improvement in LE strength. Baseline: EVAL= 23.62 sec without UE; 07/09/22:  13 seconds hands-free  Goal  status: MET  2. Pt will improve FOTO to target score of 66 to display perceived improvements in ability to complete ADL's.  Baseline: Eval= 60; 07/09/22: 65 5/21: 59% 11/05/2022= 72 Goal status:  MET  3.  Pt will decrease TUG to below 14 seconds/decrease in order to demonstrate decreased fall risk. Baseline: Eval= 20.14 sec without AD; 07/09/22: 11 seconds no AD Goal status: MET  4.  Patient will increase six minute walk test distance to >1200 ft for improve gait ability and return to PLOF to complete her walks in her neighborhood.  Baseline: EVAL= 915 feet without an  AD; 07/09/22: 1184 ft without AD 5/21: 1060 ft; 11/05/2022= 1175 feet without an AD Goal status: Partially MET  5. Pt will increase by at least 0.13 m/s in order to demonstrate clinically significant improvement in community ambulation.    Baseline: Eval = 0.70 m/s; 07/09/2022: 1.04 m/s without AD   Goal status: MET   6. Pt will report that she has not hit LLE/LUE on doorframe/walls/furniture while ambulating in the past two weeks to indicate improved ability to scan environment and to decrease injury risk with gait.  Baseline: currently has hit furniture/doorframe 2x/week 5/21: hitting doorframe less frequently. 08/23/2022- Patient reports no bumping arms into any walls or furniture since last tested on 08/13/2022. 09/27/2022= Patient reports no bumping arms into any walls or furniture since last tested on 08/23/2022.   Goal status: MET  7. Patient will increase Functional Gait Assessment score to >20/30 as to reduce fall risk and improve dynamic gait safety with community ambulation.  Baseline:5/21: 13/30; 09/27/2022= 19/30; 11/05/2022= 26/30   Goal status: MET  8. Patient will transition from skilled PT services to independent with gym based exercise program to continue with her gains made in PT and perform at an independent level for optimal safety and improved quality of life.   Baseline: 11/05/2022= Patient with no knowledge of Strengthening or balance exercises in gym based  Setting.   Goal status: INITIAL  ASSESSMENT:  CLINICAL IMPRESSION: Patient presents with good motivation. Today was technically progress note visit but all goals just assessed in last visit (recert). Did focus on remaining goal of instructing patient in gym based exercises today. She responded very well overall- able to follow all instruction for optimal safety. She will benefit from review next 1-2 visit and continued training on other gym equipment/exercises to make her as independent as possible.  Patient's condition  has the potential to improve in response to therapy. Maximum improvement is yet to be obtained. The anticipated improvement is attainable and reasonable in a generally predictable time. The pt will benefit from skilled PT services to improve her overall left LE muscle strength and improve her mobility to decrease her fall risk and improve her overall quality of life.         OBJECTIVE IMPAIRMENTS: Abnormal gait, decreased activity tolerance, decreased balance, decreased coordination, decreased endurance, decreased mobility, difficulty walking, and decreased strength.   ACTIVITY LIMITATIONS: carrying, lifting, bending, standing, squatting, stairs, and transfers  PARTICIPATION LIMITATIONS: cleaning, laundry, driving, shopping, community activity, and yard work  PERSONAL FACTORS: 3+ comorbidities: OA, HTN, hx of previous CVA  are also affecting patient's functional outcome.   REHAB POTENTIAL: Good  CLINICAL DECISION MAKING: Evolving/moderate complexity  EVALUATION COMPLEXITY: Moderate  PLAN:  PT FREQUENCY: 1-2x/week  PT DURATION: 12 weeks  PLANNED INTERVENTIONS: Therapeutic exercises, Therapeutic activity, Neuromuscular re-education, Balance training, Gait training, Patient/Family education, Self Care, Joint mobilization, Joint manipulation, Stair training,  Vestibular training, Canalith repositioning, DME instructions, Dry Needling, Electrical stimulation, Spinal manipulation, Spinal mobilization, Cryotherapy, Moist heat, Taping, and Manual therapy  PLAN FOR NEXT SESSION:  Progress BLE strength & dynamic balance. Initiate training in well zone next visit.    Merry Lofty Madisin Hasan PT   12:00 PM, 11/07/22 Louis Meckel, PT Physical Therapist - [facility]  [phone] [facility])   [facility]  11/07/2022, 12:00 PM   Physical Therapist - [facility]  Outpatient Physical Therapy- Main  Campus [phone]

## 2022-11-08 LAB — CUP PACEART REMOTE DEVICE CHECK
Date Time Interrogation Session: 20240816093337
Implantable Pulse Generator Implant Date: 19440924

## 2022-11-11 ENCOUNTER — Ambulatory Visit (INDEPENDENT_AMBULATORY_CARE_PROVIDER_SITE_OTHER): Payer: Medicare HMO

## 2022-11-11 DIAGNOSIS — I639 Cerebral infarction, unspecified: Secondary | ICD-10-CM

## 2022-11-12 ENCOUNTER — Ambulatory Visit: Payer: Medicare HMO

## 2022-11-14 ENCOUNTER — Ambulatory Visit: Payer: Medicare HMO

## 2022-11-14 DIAGNOSIS — R262 Difficulty in walking, not elsewhere classified: Secondary | ICD-10-CM | POA: Diagnosis not present

## 2022-11-14 DIAGNOSIS — N1832 Chronic kidney disease, stage 3b: Secondary | ICD-10-CM | POA: Diagnosis not present

## 2022-11-14 DIAGNOSIS — E039 Hypothyroidism, unspecified: Secondary | ICD-10-CM | POA: Diagnosis not present

## 2022-11-14 DIAGNOSIS — I63511 Cerebral infarction due to unspecified occlusion or stenosis of right middle cerebral artery: Secondary | ICD-10-CM | POA: Diagnosis not present

## 2022-11-14 DIAGNOSIS — I129 Hypertensive chronic kidney disease with stage 1 through stage 4 chronic kidney disease, or unspecified chronic kidney disease: Secondary | ICD-10-CM | POA: Diagnosis not present

## 2022-11-14 DIAGNOSIS — Z87891 Personal history of nicotine dependence: Secondary | ICD-10-CM | POA: Diagnosis not present

## 2022-11-14 DIAGNOSIS — M6281 Muscle weakness (generalized): Secondary | ICD-10-CM

## 2022-11-14 DIAGNOSIS — R278 Other lack of coordination: Secondary | ICD-10-CM | POA: Diagnosis not present

## 2022-11-14 DIAGNOSIS — R2681 Unsteadiness on feet: Secondary | ICD-10-CM | POA: Diagnosis not present

## 2022-11-14 DIAGNOSIS — E782 Mixed hyperlipidemia: Secondary | ICD-10-CM | POA: Diagnosis not present

## 2022-11-14 DIAGNOSIS — I69354 Hemiplegia and hemiparesis following cerebral infarction affecting left non-dominant side: Secondary | ICD-10-CM | POA: Diagnosis not present

## 2022-11-14 DIAGNOSIS — R2689 Other abnormalities of gait and mobility: Secondary | ICD-10-CM

## 2022-11-14 NOTE — Therapy (Signed)
OUTPATIENT PHYSICAL THERAPY TREATMENT   Patient Name: Teresa [name] MRN: [id number] DOB:29-Oct-1940, 82 y.o., female Today's Date: 11/14/2022   PCP: Dr. Aram Beecham REFERRING PROVIDER: Dr. Aram Beecham  END OF SESSION:  PT End of Session - 11/14/22 1103     Visit Number 41    Number of Visits 63    Date for PT Re-Evaluation 01/28/23    Authorization Type [employer] PPO-    Authorization Time Period 08/13/22-11/05/22    Progress Note Due on Visit 40    PT Start Time 1104    Equipment Utilized During Treatment Gait belt    Activity Tolerance Patient tolerated treatment well;No increased pain    Behavior During Therapy WFL for tasks assessed/performed                Past Medical History:  Diagnosis Date   Cancer (HCC)    skin   Hypothyroidism    Raynaud's disease    Stroke [facility])    Past Surgical History:  Procedure Laterality Date   ABDOMINAL HYSTERECTOMY  1991   APPENDECTOMY  1991   BACK SURGERY  2010   Cervical fusion C4-5-6-7   CATARACT EXTRACTION, BILATERAL Bilateral 10/2016   CHOLECYSTECTOMY  1995   COLONOSCOPY WITH PROPOFOL N/A 12/08/2018   Procedure: COLONOSCOPY WITH PROPOFOL;  Surgeon: Toledo, Boykin Nearing, MD;  Location: [facility] ENDOSCOPY;  Service: Gastroenterology;  Laterality: N/A;   EYE SURGERY     Patient Active Problem List   Diagnosis Date Noted   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   Stroke (HCC) 02/27/2021   History of nonmelanoma skin cancer 05/23/2014    ONSET DATE: 04/16/2022  REFERRING DIAG: 69.354 (ICD-10-CM) - Hemiplegia and hemiparesis following cerebral infarction affecting left non-dominant side   THERAPY DIAG:  Unsteadiness on feet  Difficulty in walking, not elsewhere classified  Muscle weakness (generalized)  Other lack of coordination  Other abnormalities of gait and mobility  Rationale for Evaluation and Treatment: Rehabilitation  SUBJECTIVE:                                                                                                                                                                                              SUBJECTIVE STATEMENT: Patient reports doing well without any new issues. Husband reports he noticed she is having difficulty getting out of car.  Pt accompanied by: Husband  PERTINENT HISTORY: Pt is a 82 y.o. female with referral for hemiplegia with left sided weakness -original CVA on 02/26/21 and new onset of left sided weakness on 04/16/2022.  R MCA CVA on 02/26/21. Pt completed outpatient PT on  01/17/2022 with majority of goals met. PMH includes: skin cancer, hypothyroidism, and Raynaud's disease, history of cervical fusion in 2010 C4-C7.   PAIN:  Are you having pain?  no  PRECAUTIONS: Fall  WEIGHT BEARING RESTRICTIONS: No  FALLS: Has patient fallen in last 6 months? No   PATIENT GOALS: I want to walk without this cane and no falls and enjoy going out to grandkids sporting event  OBJECTIVE:    TODAY'S TREATMENT:                                                                                                                              DATE: 11/07/2022  Review- well zone exercises  Knee ext- L2 x 12 reps BLE x 3 sets.  single leg at L4 x 12 reps  Knee flex- L2 x 12 reps x 3 sets (single leg) x each LE Leg press- L2 x 12 reps (BLE - warm-up) then L2 single LE x  Calf press -L2 x 12 reps BLE, single leg at L4 x 12 reps x 2 set ea LE  Instructed in use of Nustep BUE (level 7) and Seat Level 7 x 6 min at varying intensities- L1-4. VC on how to get on/off and use equipment including arm position.    PATIENT EDUCATION: Education details: Pt educated throughout session about proper posture and technique with exercises. Improved exercise technique, movement at target joints, use of target muscles after min to mod verbal, visual, tactile cues.  Person educated: Patient Education method: Explanation, Demonstration, Tactile cues, and Verbal  cues Education comprehension: verbalized understanding, returned demonstration, verbal cues required, tactile cues required, and needs further education  HOME EXERCISE PROGRAM: Access Code: [email or web address] URL: [email or web address] Date: 10/17/2022 Prepared by: [name]  Exercises - Standing Marching  - 1 x daily - 7 x weekly - 1 sets - 20 reps - Side Stepping with Counter Support  - 1 x daily - 7 x weekly - 1 sets - 20 reps - Standing Tandem Balance with Counter Support  - 1 x daily - 7 x weekly - 2 sets - seconds  hold - Sit to Stand with Arms Crossed  - 1 x daily - 7 x weekly - 2 sets - 10 reps - Heel Raises with Counter Support  - 1 x daily - 7 x weekly - 2 sets - 20 reps  GOALS: Goals reviewed with patient? Yes  SHORT TERM GOALS: Target date: 07/04/2022  Patient will be independent in home exercise program to improve strength/mobility for better functional independence with ADLs  Baseline: EVAL: Patient reports mostly just walking; 07/09/22: Pt reports her exercises are going well, to be updated future date; 11/05/2022- Will now work with patient on HEP in gym setting Goal status: IN PROGRESS  LONG TERM GOALS: Target date: 11/05/2022   Pt will decrease 5TSTS by at least 6 seconds in order to demonstrate clinically significant improvement in LE strength. Baseline: EVAL= 23.62 sec without  UE; 07/09/22:  13 seconds hands-free  Goal status: MET  2. Pt will improve FOTO to target score of 66 to display perceived improvements in ability to complete ADL's.  Baseline: Eval= 60; 07/09/22: 65 5/21: 59% 11/05/2022= 72 Goal status:  MET  3.  Pt will decrease TUG to below 14 seconds/decrease in order to demonstrate decreased fall risk. Baseline: Eval= 20.14 sec without AD; 07/09/22: 11 seconds no AD Goal status: MET  4.  Patient will increase six minute walk test distance to >1200 ft for improve gait ability and return to PLOF to complete her walks in her neighborhood.   Baseline: EVAL= 915 feet without an AD; 07/09/22: 1184 ft without AD 5/21: 1060 ft; 11/05/2022= 1175 feet without an AD Goal status: Partially MET  5. Pt will increase by at least 0.13 m/s in order to demonstrate clinically significant improvement in community ambulation.    Baseline: Eval = 0.70 m/s; 07/09/2022: 1.04 m/s without AD   Goal status: MET   6. Pt will report that she has not hit LLE/LUE on doorframe/walls/furniture while ambulating in the past two weeks to indicate improved ability to scan environment and to decrease injury risk with gait.  Baseline: currently has hit furniture/doorframe 2x/week 5/21: hitting doorframe less frequently. 08/23/2022- Patient reports no bumping arms into any walls or furniture since last tested on 08/13/2022. 09/27/2022= Patient reports no bumping arms into any walls or furniture since last tested on 08/23/2022.   Goal status: MET  7. Patient will increase Functional Gait Assessment score to >20/30 as to reduce fall risk and improve dynamic gait safety with community ambulation.  Baseline:5/21: 13/30; 09/27/2022= 19/30; 11/05/2022= 26/30   Goal status: MET  8. Patient will transition from skilled PT services to independent with gym based exercise program to continue with her gains made in PT and perform at an independent level for optimal safety and improved quality of life.   Baseline: 11/05/2022= Patient with no knowledge of Strengthening or balance exercises in gym based  Setting.   Goal status: INITIAL  ASSESSMENT:  CLINICAL IMPRESSION: Treatment focused on LE strengthening and learning how to use the gym equipment properly. She was motivated and performed better overall without any significant difficulty. The pt will benefit from skilled PT services to improve her overall left LE muscle strength and improve her mobility to decrease her fall risk and improve her overall quality of life.         OBJECTIVE IMPAIRMENTS: Abnormal gait, decreased  activity tolerance, decreased balance, decreased coordination, decreased endurance, decreased mobility, difficulty walking, and decreased strength.   ACTIVITY LIMITATIONS: carrying, lifting, bending, standing, squatting, stairs, and transfers  PARTICIPATION LIMITATIONS: cleaning, laundry, driving, shopping, community activity, and yard work  PERSONAL FACTORS: 3+ comorbidities: OA, HTN, hx of previous CVA  are also affecting patient's functional outcome.   REHAB POTENTIAL: Good  CLINICAL DECISION MAKING: Evolving/moderate complexity  EVALUATION COMPLEXITY: Moderate  PLAN:  PT FREQUENCY: 1-2x/week  PT DURATION: 12 weeks  PLANNED INTERVENTIONS: Therapeutic exercises, Therapeutic activity, Neuromuscular re-education, Balance training, Gait training, Patient/Family education, Self Care, Joint mobilization, Joint manipulation, Stair training, Vestibular training, Canalith repositioning, DME instructions, Dry Needling, Electrical stimulation, Spinal manipulation, Spinal mobilization, Cryotherapy, Moist heat, Taping, and Manual therapy  PLAN FOR NEXT SESSION:  Progress BLE strength & dynamic balance. Continue training in well zone next visit.    Lenda Kelp PT   11:39 AM, 11/14/22 Louis Meckel, PT Physical Therapist - [facility]  [phone]  [facility])   [facility]  11/14/2022, 11:39 AM   Physical Therapist - [facility] [place]  Outpatient Physical Therapy- Main Campus [phone]

## 2022-11-18 DIAGNOSIS — I639 Cerebral infarction, unspecified: Secondary | ICD-10-CM | POA: Diagnosis not present

## 2022-11-19 ENCOUNTER — Ambulatory Visit: Payer: Medicare PPO

## 2022-11-19 ENCOUNTER — Ambulatory Visit: Payer: Medicare HMO

## 2022-11-19 DIAGNOSIS — H35033 Hypertensive retinopathy, bilateral: Secondary | ICD-10-CM | POA: Diagnosis not present

## 2022-11-19 DIAGNOSIS — I10 Essential (primary) hypertension: Secondary | ICD-10-CM | POA: Diagnosis not present

## 2022-11-19 DIAGNOSIS — D3131 Benign neoplasm of right choroid: Secondary | ICD-10-CM | POA: Diagnosis not present

## 2022-11-19 DIAGNOSIS — H524 Presbyopia: Secondary | ICD-10-CM | POA: Diagnosis not present

## 2022-11-19 DIAGNOSIS — Z961 Presence of intraocular lens: Secondary | ICD-10-CM | POA: Diagnosis not present

## 2022-11-19 DIAGNOSIS — H532 Diplopia: Secondary | ICD-10-CM | POA: Diagnosis not present

## 2022-11-20 ENCOUNTER — Emergency Department: Payer: Medicare HMO

## 2022-11-20 ENCOUNTER — Encounter: Payer: Self-pay | Admitting: Emergency Medicine

## 2022-11-20 ENCOUNTER — Other Ambulatory Visit: Payer: Self-pay

## 2022-11-20 ENCOUNTER — Emergency Department
Admission: EM | Admit: 2022-11-20 | Discharge: 2022-11-20 | Disposition: A | Discharge status: Home / Self Care | Payer: Medicare HMO | Attending: Emergency Medicine | Admitting: Emergency Medicine

## 2022-11-20 DIAGNOSIS — Z8673 Personal history of transient ischemic attack (TIA), and cerebral infarction without residual deficits: Secondary | ICD-10-CM | POA: Insufficient documentation

## 2022-11-20 DIAGNOSIS — R29810 Facial weakness: Secondary | ICD-10-CM | POA: Diagnosis not present

## 2022-11-20 DIAGNOSIS — I6782 Cerebral ischemia: Secondary | ICD-10-CM | POA: Diagnosis not present

## 2022-11-20 DIAGNOSIS — E039 Hypothyroidism, unspecified: Secondary | ICD-10-CM | POA: Insufficient documentation

## 2022-11-20 DIAGNOSIS — R4781 Slurred speech: Secondary | ICD-10-CM | POA: Insufficient documentation

## 2022-11-20 DIAGNOSIS — G9389 Other specified disorders of brain: Secondary | ICD-10-CM | POA: Diagnosis not present

## 2022-11-20 DIAGNOSIS — R531 Weakness: Secondary | ICD-10-CM | POA: Diagnosis not present

## 2022-11-20 DIAGNOSIS — Z719 Counseling, unspecified: Secondary | ICD-10-CM | POA: Diagnosis not present

## 2022-11-20 DIAGNOSIS — R9431 Abnormal electrocardiogram [ECG] [EKG]: Secondary | ICD-10-CM | POA: Diagnosis not present

## 2022-11-20 DIAGNOSIS — G8194 Hemiplegia, unspecified affecting left nondominant side: Secondary | ICD-10-CM | POA: Diagnosis not present

## 2022-11-20 LAB — COMPREHENSIVE METABOLIC PANEL
ALT: 15 U/L (ref 0–44)
AST: 21 U/L (ref 15–41)
Albumin: 3.8 g/dL (ref 3.5–5.0)
Alkaline Phosphatase: 63 U/L (ref 38–126)
Anion gap: 5 (ref 5–15)
BUN: 23 mg/dL (ref 8–23)
CO2: 25 mmol/L (ref 22–32)
Calcium: 8.8 mg/dL — ABNORMAL LOW (ref 8.9–10.3)
Chloride: 106 mmol/L (ref 98–111)
Creatinine, Ser: 1.4 mg/dL — ABNORMAL HIGH (ref 0.44–1.00)
GFR, Estimated: 38 mL/min — ABNORMAL LOW (ref >60)
Glucose, Bld: 101 mg/dL — ABNORMAL HIGH (ref 70–99)
Potassium: 3.8 mmol/L (ref 3.5–5.1)
Sodium: 136 mmol/L (ref 135–145)
Total Bilirubin: 0.7 mg/dL (ref 0.3–1.2)
Total Protein: 7.4 g/dL (ref 6.5–8.1)

## 2022-11-20 LAB — DIFFERENTIAL
Abs Immature Granulocytes: 0.02 10*3/uL (ref 0.00–0.07)
Basophils Absolute: 0 10*3/uL (ref 0.0–0.1)
Basophils Relative: 1 %
Eosinophils Absolute: 0.1 10*3/uL (ref 0.0–0.5)
Eosinophils Relative: 2 %
Immature Granulocytes: 0 %
Lymphocytes Relative: 25 %
Lymphs Abs: 1.6 10*3/uL (ref 0.7–4.0)
Monocytes Absolute: 0.5 10*3/uL (ref 0.1–1.0)
Monocytes Relative: 8 %
Neutro Abs: 4 10*3/uL (ref 1.7–7.7)
Neutrophils Relative %: 64 %

## 2022-11-20 LAB — CBG MONITORING, ED: Glucose-Capillary: 100 mg/dL — ABNORMAL HIGH (ref 70–99)

## 2022-11-20 LAB — CBC
HCT: 36.3 % (ref 36.0–46.0)
Hemoglobin: 12.6 g/dL (ref 12.0–15.0)
MCH: 31.2 pg (ref 26.0–34.0)
MCHC: 34.7 g/dL (ref 30.0–36.0)
MCV: 89.9 fL (ref 80.0–100.0)
Platelets: 193 10*3/uL (ref 150–400)
RBC: 4.04 MIL/uL (ref 3.87–5.11)
RDW: 13 % (ref 11.5–15.5)
WBC: 6.2 10*3/uL (ref 4.0–10.5)
nRBC: 0 % (ref 0.0–0.2)

## 2022-11-20 LAB — ETHANOL: Alcohol, Ethyl (B): <10 mg/dL (ref <10)

## 2022-11-20 LAB — PROTIME-INR
INR: 1 (ref 0.8–1.2)
Prothrombin Time: 13.8 seconds (ref 11.4–15.2)

## 2022-11-20 LAB — APTT: aPTT: 32 seconds (ref 24–36)

## 2022-11-20 MED ORDER — VALACYCLOVIR HCL 1 G PO TABS: 1000.0000 mg | ORAL_TABLET | Freq: Three times a day (TID) | ORAL | 0 refills | Status: AC

## 2022-11-20 MED ORDER — PREDNISONE 20 MG PO TABS: 40.0000 mg | ORAL_TABLET | Freq: Every day | ORAL | 0 refills | Status: AC

## 2022-11-20 MED ORDER — SODIUM CHLORIDE 0.9% FLUSH: 3.0000 mL | Freq: Once | INTRAVENOUS | Status: DC

## 2022-11-20 NOTE — ED Triage Notes (Signed)
Pt to ED via POV. Pt states that her neurologist sent her here for left sided weakness and left facial droop, as well as slurred speech. Pt states that he last known well is 11/15/2022 around 11 pm. Pt woke up with symptoms on 11/16/2022 around 0800. Pt states that she saw her eye doctor yesterday who noticed that she had left sided facial droop. Pt is in NAD at this time.

## 2022-11-20 NOTE — ED Notes (Signed)
See triage note.  Pt state HX of CVA int he past. Pt denies injury or trauma to head. Pt states she was sent by eye MD due to "my left eye not working with my right eye'. Pt does have some minimal ataxia to L side, pt states deficits from old stroke to L side and states weakness is the same in L arm since stroke. Pt does have a L sided facial droop. Pt is A&Ox4. Pt states new change in BP meds but denies any issues with this, states this change was made because of expense of old BP meds.   Pt was ambulatory to RM 11 from triage with steady gait but does endorse she feels like she leaning more to the left side". Pt is A&Ox4 at this time.

## 2022-11-20 NOTE — Discharge Instructions (Signed)
You have been seen in the emergency department for left facial droop.  Your MRI does not appear to show any new stroke.  You could be suffering from a condition known as Bell's palsy.  Please take your medications as prescribed for their entire course.  Please follow-up with your neurologist as soon as possible for recheck/reevaluation.  Return to the emergency department for any weakness or numbness of any arm or leg any difficulty speaking walking or thinking or any other symptom personally concerning to yourself.

## 2022-11-20 NOTE — ED Provider Notes (Signed)
[facility] Provider Note    Event Date/Time   First MD Initiated Contact with Patient 11/20/22 1017     (approximate)  History   Chief Complaint: Weakness and Facial Droop  HPI  Teresa [name] is a 82 y.o. female with a past medical history of prior CVA, hypothyroidism, presents to the emergency department for left facial droop and left arm weakness.  According to the patient she has a history of a prior CVA, she states she has never noted left facial droop before but does have significant left arm weakness at baseline.  Patient states she saw her ophthalmologist yesterday and they were concerned that the patient could have had a new CVA and sent her to her neurologist today.  Patient saw her neurologist, Dr. Sherryll Burger today who sent her to the emergency department for a stroke evaluation.  Patient states she went to her doctor on the 23rd and they did not note a left facial droop so she believes that could be the last known normal.  Patient does not recognize a facial droop in herself but states others have noted it.  Patient does have left arm weakness but states this is chronic and unchanged per patient.  Physical Exam   Triage Vital Signs: ED Triage Vitals  Encounter Vitals Group     BP 11/20/22 0945 (!) 142/66     Systolic BP Percentile --      Diastolic BP Percentile --      Pulse Rate 11/20/22 0945 66     Resp 11/20/22 0945 16     Temp 11/20/22 0945 97.7 F (36.5 C)     Temp Source 11/20/22 0945 Oral     SpO2 11/20/22 0945 100 %     Weight 11/20/22 0946 159 lb (72.1 kg)     Height 11/20/22 0946 5\' 2"  (1.575 m)     Head Circumference --      Peak Flow --      Pain Score 11/20/22 0946 0     Pain Loc --      Pain Education --      Exclude from Growth Chart --     Most recent vital signs: Vitals:   11/20/22 0945  BP: (!) 142/66  Pulse: 66  Resp: 16  Temp: 97.7 F (36.5 C)  SpO2: 100%    General: Awake, no distress.  Patient does have a  mild left facial droop. CV:  Good peripheral perfusion.  Regular rate and rhythm  Resp:  Normal effort.  Equal breath sounds bilaterally.  Abd:  No distention.  Soft, nontender.  No rebound or guarding. Other:  Patient has significant left arm weakness compared to the right as well as a drift in left upper extremity.  Patient states this appears unchanged to her.  Does have a mild left facial droop.   ED Results / Procedures / Treatments   EKG  EKG viewed and interpreted by myself shows a normal sinus rhythm at 61 bpm with a narrow QRS, left axis deviation, largely normal intervals with no concerning ST changes.  RADIOLOGY  I have reviewed and interpreted CT head images.  Patient appears to have a older right-sided infarct I do not see any obvious bleed. Radiology states no acute abnormality stable right MCA territory infarct.   MEDICATIONS ORDERED IN ED: Medications  sodium chloride flush (NS) 0.9 % injection 3 mL (has no administration in time range)     IMPRESSION / MDM / ASSESSMENT  AND PLAN / ED COURSE  I reviewed the triage vital signs and the nursing notes.  Patient's presentation is most consistent with acute presentation with potential threat to life or bodily function.  Patient presents to the emergency department for left facial droop noted by her ophthalmologist, sent by her neurologist for further evaluation.  Patient does have a mild left droop on my exam also has left upper extremity weakness however she states this is chronic and unchanged per patient.  CT scan shows older right MCA infarct known to the patient.  Patient's lab work is reassuring with a reassuring CBC reassuring chemistry.  Patient last had an MRI in January.  Given the patient's symptoms we will repeat an MRI of the brain.  Patient states she did have a loop recorder placed last April however she has the [employer] card stating that it is MRI compatible this is not a pacemaker or defibrillator.  We will  proceed with MR imaging to evaluate for CVA.  Differential would include Bell's palsy, CVA, exacerbation of prior CVA deficits.  I reviewed the patient's neurology note from today the main concern being the left facial droop as well as left arm weakness.  Patient states the left arm has always been weak and this feels no different than it typically does.  Patient's MRI today shows no acute infarct.  Lab work is reassuring with a normal CBC reassuring chemistry.  Given the patient feels as if her symptoms are baseline for her I believe the patient will be safe for discharge home given her negative MRI.  It is possible that the patient is suffering from Bell's palsy in addition to her known prior CVA.  We will cover with prednisone and valacyclovir.  Will have the patient follow-up with her neurologist.  FINAL CLINICAL IMPRESSION(S) / ED DIAGNOSES   Facial droop   Note:  This document was prepared using Dragon voice recognition software and may include unintentional dictation errors.   Minna Antis, MD 11/20/22 1300

## 2022-11-21 ENCOUNTER — Ambulatory Visit: Payer: Medicare HMO

## 2022-11-21 DIAGNOSIS — R2681 Unsteadiness on feet: Secondary | ICD-10-CM

## 2022-11-21 DIAGNOSIS — M6281 Muscle weakness (generalized): Secondary | ICD-10-CM | POA: Diagnosis not present

## 2022-11-21 DIAGNOSIS — R262 Difficulty in walking, not elsewhere classified: Secondary | ICD-10-CM

## 2022-11-21 DIAGNOSIS — R2689 Other abnormalities of gait and mobility: Secondary | ICD-10-CM | POA: Diagnosis not present

## 2022-11-21 DIAGNOSIS — R278 Other lack of coordination: Secondary | ICD-10-CM | POA: Diagnosis not present

## 2022-11-21 DIAGNOSIS — I63511 Cerebral infarction due to unspecified occlusion or stenosis of right middle cerebral artery: Secondary | ICD-10-CM

## 2022-11-21 NOTE — Progress Notes (Signed)
Carelink Summary Report / Loop Recorder 

## 2022-11-21 NOTE — Therapy (Signed)
OUTPATIENT PHYSICAL THERAPY TREATMENT   Patient Name: Teresa [name] MRN: [id number] DOB:08/16/1940, 82 y.o., female Today's Date: 11/21/2022   PCP: Dr. Aram Beecham REFERRING PROVIDER: Dr. Aram Beecham  END OF SESSION:  PT End of Session - 11/21/22 1110     Visit Number 42    Number of Visits 63    Date for PT Re-Evaluation 01/28/23    Authorization Type [employer] PPO-    Authorization Time Period 08/13/22-11/05/22    Progress Note Due on Visit 40    PT Start Time 1100    PT Stop Time 1140    PT Time Calculation (min) 40 min    Equipment Utilized During Treatment Gait belt    Activity Tolerance Patient tolerated treatment well;No increased pain    Behavior During Therapy WFL for tasks assessed/performed                 Past Medical History:  Diagnosis Date   Cancer (HCC)    skin   Hypothyroidism    Raynaud's disease    Stroke 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   Stroke (HCC) 02/27/2021   History of nonmelanoma skin cancer 05/23/2014    ONSET DATE: 04/16/2022  REFERRING DIAG: 69.354 (ICD-10-CM) - Hemiplegia and hemiparesis following cerebral infarction affecting left non-dominant side   THERAPY DIAG:  Unsteadiness on feet  Difficulty in walking, not elsewhere classified  Muscle weakness (generalized)  Other lack of coordination  Other abnormalities of gait and mobility  Right middle cerebral artery stroke (HCC)  Rationale  for Evaluation and Treatment: Rehabilitation  SUBJECTIVE:                                                                                                                                                                                             SUBJECTIVE STATEMENT: Patient reports she was seen in ED yesterday for facial drop and some increased left sided weakness. She reported feeling sluggish today.   Pt accompanied by: Husband  PERTINENT HISTORY: Pt is a 82 y.o. female with referral for hemiplegia with  left sided weakness -original CVA on 02/26/21 and new onset of left sided weakness on 04/16/2022.  R MCA CVA on 02/26/21. Pt completed outpatient PT on 01/17/2022 with majority of goals met. PMH includes: skin cancer, hypothyroidism, and Raynaud's disease, history of cervical fusion in 2010 C4-C7.   PAIN:  Are you having pain?  no  PRECAUTIONS: Fall  WEIGHT BEARING RESTRICTIONS: No  FALLS: Has patient fallen in last 6 months? No   PATIENT GOALS: I want to walk without this cane and no falls and enjoy going out to grandkids sporting event  OBJECTIVE:    TODAY'S TREATMENT:                                                                                                                              DATE: 11/21/2022  Patient reassessed after most recent ED visit-  + facial droop/Left side facial weakness  BP= 133/83   MMT- Left Hip flex = 4-/5 and knee ext= 4-/5  Resistive therex seated hip flex= 12 reps each RTB; 10 reps GTB; 10 reps BTB Resistive knee ext 3# AW 2 sets of 120 Resistive Hip flex/Abd/add 3# x 10 reps - increased difficulty with left LE  Gait in clinic approx 400 feet - slower than normal and decreased step length.           PATIENT EDUCATION: Education details: Pt educated throughout session about proper posture and technique with exercises. Improved exercise technique, movement at target joints, use of target muscles after min to mod verbal, visual, tactile  cues.  Person educated: Patient Education method: Explanation, Demonstration, Tactile cues, and Verbal cues Education comprehension: verbalized understanding, returned demonstration, verbal cues required, tactile cues required, and needs further education  HOME EXERCISE PROGRAM: Access Code: [email or web address] URL: [email or web address] Date: 10/17/2022 Prepared by: [name]  Exercises - Standing Marching  - 1 x daily - 7 x weekly - 1 sets - 20 reps - Side Stepping with Counter Support  - 1 x daily - 7 x weekly - 1 sets - 20 reps - Standing Tandem Balance with Counter Support  - 1 x daily - 7 x weekly - 2 sets - seconds  hold - Sit to Stand with Arms Crossed  - 1 x daily - 7 x weekly - 2 sets - 10 reps - Heel Raises with Counter Support  - 1 x daily - 7 x weekly - 2 sets - 20 reps  GOALS: Goals reviewed with patient? Yes  SHORT TERM GOALS: Target date: 07/04/2022  Patient will be independent in home exercise program to improve strength/mobility for better functional independence with ADLs  Baseline: EVAL: Patient reports mostly just walking; 07/09/22: Pt reports her exercises are going well, to be updated future date; 11/05/2022- Will now work with patient on HEP in gym setting Goal status: IN PROGRESS  LONG TERM GOALS: Target date: 11/05/2022   Pt will decrease 5TSTS by at least 6 seconds in order to  demonstrate clinically significant improvement in LE strength. Baseline: EVAL= 23.62 sec without UE; 07/09/22:  13 seconds hands-free  Goal status: MET  2. Pt will improve FOTO to target score of 66 to display perceived improvements in ability to complete ADL's.  Baseline: Eval= 60; 07/09/22: 65 5/21: 59% 11/05/2022= 72 Goal status:  MET  3.  Pt will decrease TUG to below 14 seconds/decrease in order to demonstrate decreased fall risk. Baseline: Eval= 20.14 sec without AD; 07/09/22: 11 seconds no AD Goal status: MET  4.  Patient will increase six minute walk test distance  to >1200 ft for improve gait ability and return to PLOF to complete her walks in her neighborhood.  Baseline: EVAL= 915 feet without an AD; 07/09/22: 1184 ft without AD 5/21: 1060 ft; 11/05/2022= 1175 feet without an AD Goal status: Partially MET  5. Pt will increase by at least 0.13 m/s in order to demonstrate clinically significant improvement in community ambulation.    Baseline: Eval = 0.70 m/s; 07/09/2022: 1.04 m/s without AD   Goal status: MET   6. Pt will report that she has not hit LLE/LUE on doorframe/walls/furniture while ambulating in the past two weeks to indicate improved ability to scan environment and to decrease injury risk with gait.  Baseline: currently has hit furniture/doorframe 2x/week 5/21: hitting doorframe less frequently. 08/23/2022- Patient reports no bumping arms into any walls or furniture since last tested on 08/13/2022. 09/27/2022= Patient reports no bumping arms into any walls or furniture since last tested on 08/23/2022.   Goal status: MET  7. Patient will increase Functional Gait Assessment score to >20/30 as to reduce fall risk and improve dynamic gait safety with community ambulation.  Baseline:5/21: 13/30; 09/27/2022= 19/30; 11/05/2022= 26/30   Goal status: MET  8. Patient will transition from skilled PT services to independent with gym based exercise program to continue with her gains made in PT and perform at an independent level for optimal safety and improved quality of life.   Baseline: 11/05/2022= Patient with no knowledge of Strengthening or balance exercises in gym based  Setting.   Goal status: INITIAL  ASSESSMENT:  CLINICAL IMPRESSION:  Patient re-evaluated due to recent symptoms of CVA- She does present with increased overall left sided weakness. No worsening since ED visit yesteday. Spent increased time reviewing signs of CVA and instructed patient to return to ED if her symptoms worsened. She verbalized understanding. The pt will benefit from skilled  PT services to improve her overall left LE muscle strength and improve her mobility to decrease her fall risk and improve her overall quality of life.         OBJECTIVE IMPAIRMENTS: Abnormal gait, decreased activity tolerance, decreased balance, decreased coordination, decreased endurance, decreased mobility, difficulty walking, and decreased strength.   ACTIVITY LIMITATIONS: carrying, lifting, bending, standing, squatting, stairs, and transfers  PARTICIPATION LIMITATIONS: cleaning, laundry, driving, shopping, community activity, and yard work  PERSONAL FACTORS: 3+ comorbidities: OA, HTN, hx of previous CVA  are also affecting patient's functional outcome.   REHAB POTENTIAL: Good  CLINICAL DECISION MAKING: Evolving/moderate complexity  EVALUATION COMPLEXITY: Moderate  PLAN:  PT FREQUENCY: 1-2x/week  PT DURATION: 12 weeks  PLANNED INTERVENTIONS: Therapeutic exercises, Therapeutic activity, Neuromuscular re-education, Balance training, Gait training, Patient/Family education, Self Care, Joint mobilization, Joint manipulation, Stair training, Vestibular training, Canalith repositioning, DME instructions, Dry Needling, Electrical stimulation, Spinal manipulation, Spinal mobilization, Cryotherapy, Moist heat, Taping, and Manual therapy  PLAN FOR NEXT SESSION:  Progress BLE strength & dynamic balance. Continue  training in well zone next visit.    Merry Lofty Duncan Alejandro PT   1:10 PM, 11/21/22 Louis Meckel, PT Physical Therapist - [facility]  [phone] [facility])   [facility]  11/21/2022, 1:10 PM   Physical Therapist - [facility]  Outpatient Physical Therapy- Main Campus [phone]

## 2022-11-26 ENCOUNTER — Ambulatory Visit: Payer: Medicare HMO

## 2022-11-26 ENCOUNTER — Ambulatory Visit: Payer: Medicare PPO

## 2022-11-28 ENCOUNTER — Ambulatory Visit: Payer: Medicare HMO

## 2022-11-28 ENCOUNTER — Ambulatory Visit: Payer: Medicare HMO | Attending: Internal Medicine

## 2022-11-28 DIAGNOSIS — R2689 Other abnormalities of gait and mobility: Secondary | ICD-10-CM | POA: Insufficient documentation

## 2022-11-28 DIAGNOSIS — I63511 Cerebral infarction due to unspecified occlusion or stenosis of right middle cerebral artery: Secondary | ICD-10-CM | POA: Insufficient documentation

## 2022-11-28 DIAGNOSIS — M6281 Muscle weakness (generalized): Secondary | ICD-10-CM | POA: Insufficient documentation

## 2022-11-28 DIAGNOSIS — R278 Other lack of coordination: Secondary | ICD-10-CM | POA: Insufficient documentation

## 2022-11-28 DIAGNOSIS — R262 Difficulty in walking, not elsewhere classified: Secondary | ICD-10-CM | POA: Insufficient documentation

## 2022-11-28 DIAGNOSIS — R2681 Unsteadiness on feet: Secondary | ICD-10-CM | POA: Insufficient documentation

## 2022-11-28 NOTE — Therapy (Signed)
OUTPATIENT PHYSICAL THERAPY TREATMENT   Patient Name: Teresa [name] MRN: [id number] DOB:08-09-1940, 82 y.o., female Today's Date: 11/28/2022   PCP: Dr. Aram Beecham REFERRING PROVIDER: Dr. Aram Beecham  END OF SESSION:  PT End of Session - 11/28/22 1109     Visit Number 43    Number of Visits 63    Date for PT Re-Evaluation 01/28/23    Authorization Type [employer] PPO-    Authorization Time Period 08/13/22-11/05/22    Progress Note Due on Visit 40    PT Start Time 1100    PT Stop Time 1125    PT Time Calculation (min) 25 min    Equipment Utilized During Treatment --    Activity Tolerance Patient tolerated treatment well    Behavior During Therapy WFL for tasks assessed/performed                 Past Medical History:  Diagnosis Date   Cancer (HCC)    skin   Hypothyroidism    Raynaud's disease    Stroke [facility])    Past Surgical History:  Procedure Laterality Date   ABDOMINAL HYSTERECTOMY  1991   APPENDECTOMY  1991   BACK SURGERY  2010   Cervical fusion C4-5-6-7   CATARACT EXTRACTION, BILATERAL Bilateral 10/2016   CHOLECYSTECTOMY  1995   COLONOSCOPY WITH PROPOFOL N/A 12/08/2018   Procedure: COLONOSCOPY WITH PROPOFOL;  Surgeon: Toledo, Boykin Nearing, MD;  Location: [facility] ENDOSCOPY;  Service: Gastroenterology;  Laterality: N/A;   EYE SURGERY     Patient Active Problem List   Diagnosis Date Noted   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   Stroke (HCC) 02/27/2021   History of nonmelanoma skin cancer 05/23/2014    ONSET DATE: 04/16/2022  REFERRING DIAG: 69.354 (ICD-10-CM) - Hemiplegia and hemiparesis following cerebral infarction affecting left non-dominant side   THERAPY DIAG:  Unsteadiness on feet  Difficulty in walking, not elsewhere classified  Muscle weakness (generalized)  Other lack of coordination  Other abnormalities of gait and mobility  Right middle cerebral artery stroke (HCC)  Rationale for Evaluation and  Treatment: Rehabilitation  SUBJECTIVE:                                                                                                                                                                                             SUBJECTIVE STATEMENT:  Patient reports still feeling "loopy" and "all I want to do is sleep."   Pt accompanied by: Husband  PERTINENT HISTORY: Pt is a 82 y.o. female with referral for hemiplegia with left sided weakness -original CVA on 02/26/21 and new onset of  left sided weakness on 04/16/2022.  R MCA CVA on 02/26/21. Pt completed outpatient PT on 01/17/2022 with majority of goals met. PMH includes: skin cancer, hypothyroidism, and Raynaud's disease, history of cervical fusion in 2010 C4-C7.   PAIN:  Are you having pain?  no  PRECAUTIONS: Fall  WEIGHT BEARING RESTRICTIONS: No  FALLS: Has patient fallen in last 6 months? No   PATIENT GOALS: I want to walk without this cane and no falls and enjoy going out to grandkids sporting event  OBJECTIVE:    TODAY'S TREATMENT:                                                                                                                              DATE: 11/28/2022  *NON billable visit BP= 153/87   Patient reports symptoms of "loopy" and wanting to sleep all the time Patient reports weakness and no appetite  Phone call to Dr. Sherryll Burger Neurology office and spoke with Marchelle Folks- informed her of patient symptoms since returning home and recommendation that patient be seen by Neurology sooner than scheduled visit in October. Marchelle Folks stated she would relay info to Dr. Sherryll Burger and they would work her in to schedule by next week. Instructed/Review symptoms of CVA and to go to ED if any worsening symptoms. Patient verbalized understanding.        PATIENT EDUCATION: Education details: Pt educated throughout session about proper posture and technique with exercises. Improved exercise technique, movement at target joints, use of  target muscles after min to mod verbal, visual, tactile cues.  Person educated: Patient Education method: Explanation, Demonstration, Tactile cues, and Verbal cues Education comprehension: verbalized understanding, returned demonstration, verbal cues required, tactile cues required, and needs further education  HOME EXERCISE PROGRAM: Access Code: [email or web address] URL: [email or web address] Date: 10/17/2022 Prepared by: [name]  Exercises - Standing Marching  - 1 x daily - 7 x weekly - 1 sets - 20 reps - Side Stepping with Counter Support  - 1 x daily - 7 x weekly - 1 sets - 20 reps - Standing Tandem Balance with Counter Support  - 1 x daily - 7 x weekly - 2 sets - seconds  hold - Sit to Stand with Arms Crossed  - 1 x daily - 7 x weekly - 2 sets - 10 reps - Heel Raises with Counter Support  - 1 x daily - 7 x weekly - 2 sets - 20 reps  GOALS: Goals reviewed with patient? Yes  SHORT TERM GOALS: Target date: 07/04/2022  Patient will be independent in home exercise program to improve strength/mobility for better functional independence with ADLs  Baseline: EVAL: Patient reports mostly just walking; 07/09/22: Pt reports her exercises are going well, to be updated future date; 11/05/2022- Will now work with patient on HEP in gym setting Goal status: IN PROGRESS  LONG TERM GOALS: Target date: 11/05/2022   Pt will decrease 5TSTS by at least 6 seconds in order to demonstrate  clinically significant improvement in LE strength. Baseline: EVAL= 23.62 sec without UE; 07/09/22:  13 seconds hands-free  Goal status: MET  2. Pt will improve FOTO to target score of 66 to display perceived improvements in ability to complete ADL's.  Baseline: Eval= 60; 07/09/22: 65 5/21: 59% 11/05/2022= 72 Goal status:  MET  3.  Pt will decrease TUG to below 14 seconds/decrease in order to demonstrate decreased fall risk. Baseline: Eval= 20.14 sec without AD; 07/09/22: 11 seconds no AD Goal status:  MET  4.  Patient will increase six minute walk test distance to >1200 ft for improve gait ability and return to PLOF to complete her walks in her neighborhood.  Baseline: EVAL= 915 feet without an AD; 07/09/22: 1184 ft without AD 5/21: 1060 ft; 11/05/2022= 1175 feet without an AD Goal status: Partially MET  5. Pt will increase by at least 0.13 m/s in order to demonstrate clinically significant improvement in community ambulation.    Baseline: Eval = 0.70 m/s; 07/09/2022: 1.04 m/s without AD   Goal status: MET   6. Pt will report that she has not hit LLE/LUE on doorframe/walls/furniture while ambulating in the past two weeks to indicate improved ability to scan environment and to decrease injury risk with gait.  Baseline: currently has hit furniture/doorframe 2x/week 5/21: hitting doorframe less frequently. 08/23/2022- Patient reports no bumping arms into any walls or furniture since last tested on 08/13/2022. 09/27/2022= Patient reports no bumping arms into any walls or furniture since last tested on 08/23/2022.   Goal status: MET  7. Patient will increase Functional Gait Assessment score to >20/30 as to reduce fall risk and improve dynamic gait safety with community ambulation.  Baseline:5/21: 13/30; 09/27/2022= 19/30; 11/05/2022= 26/30   Goal status: MET  8. Patient will transition from skilled PT services to independent with gym based exercise program to continue with her gains made in PT and perform at an independent level for optimal safety and improved quality of life.   Baseline: 11/05/2022= Patient with no knowledge of Strengthening or balance exercises in gym based  Setting.   Goal status: INITIAL  ASSESSMENT:  CLINICAL IMPRESSION:  Non-billable visit today. Patient still not feeling well and PT called Dr. Margaretmary Eddy office to notify of ongoing symptoms. Spent increased time reviewing signs of CVA and instructed patient to return to ED if her symptoms worsened. She verbalized understanding.  Per conversation with Marchelle Folks from [facility] neurology she will have the office work her in to be seen by next week vs. Waiting til scheduled visit in Oct.  The pt will benefit from skilled PT services to improve her overall left LE muscle strength and improve her mobility to decrease her fall risk and improve her overall quality of life.         OBJECTIVE IMPAIRMENTS: Abnormal gait, decreased activity tolerance, decreased balance, decreased coordination, decreased endurance, decreased mobility, difficulty walking, and decreased strength.   ACTIVITY LIMITATIONS: carrying, lifting, bending, standing, squatting, stairs, and transfers  PARTICIPATION LIMITATIONS: cleaning, laundry, driving, shopping, community activity, and yard work  PERSONAL FACTORS: 3+ comorbidities: OA, HTN, hx of previous CVA  are also affecting patient's functional outcome.   REHAB POTENTIAL: Good  CLINICAL DECISION MAKING: Evolving/moderate complexity  EVALUATION COMPLEXITY: Moderate  PLAN:  PT FREQUENCY: 1-2x/week  PT DURATION: 12 weeks  PLANNED INTERVENTIONS: Therapeutic exercises, Therapeutic activity, Neuromuscular re-education, Balance training, Gait training, Patient/Family education, Self Care, Joint mobilization, Joint manipulation, Stair training, Vestibular training, Canalith repositioning, DME instructions, Dry Needling, Electrical stimulation,  Spinal manipulation, Spinal mobilization, Cryotherapy, Moist heat, Taping, and Manual therapy  PLAN FOR NEXT SESSION:  Ask about MD visit. Progress BLE strength & dynamic balance. Continue training in well zone next visit.    Lenda Kelp PT   11:54 AM, 11/28/22 Louis Meckel, PT Physical Therapist - [facility]  [phone] [facility])   [facility]  11/28/2022, 11:54 AM   Physical Therapist - [facility]  Outpatient Physical Therapy- Main Campus [phone]

## 2022-12-03 ENCOUNTER — Ambulatory Visit: Payer: Medicare HMO

## 2022-12-03 ENCOUNTER — Ambulatory Visit: Payer: Medicare PPO

## 2022-12-03 DIAGNOSIS — Z23 Encounter for immunization: Secondary | ICD-10-CM | POA: Diagnosis not present

## 2022-12-03 DIAGNOSIS — R531 Weakness: Secondary | ICD-10-CM | POA: Diagnosis not present

## 2022-12-05 ENCOUNTER — Ambulatory Visit: Payer: Medicare HMO

## 2022-12-05 DIAGNOSIS — R262 Difficulty in walking, not elsewhere classified: Secondary | ICD-10-CM | POA: Diagnosis not present

## 2022-12-05 DIAGNOSIS — M6281 Muscle weakness (generalized): Secondary | ICD-10-CM | POA: Diagnosis not present

## 2022-12-05 DIAGNOSIS — R278 Other lack of coordination: Secondary | ICD-10-CM

## 2022-12-05 DIAGNOSIS — R2689 Other abnormalities of gait and mobility: Secondary | ICD-10-CM | POA: Diagnosis not present

## 2022-12-05 DIAGNOSIS — I63511 Cerebral infarction due to unspecified occlusion or stenosis of right middle cerebral artery: Secondary | ICD-10-CM | POA: Diagnosis not present

## 2022-12-05 DIAGNOSIS — R2681 Unsteadiness on feet: Secondary | ICD-10-CM | POA: Diagnosis not present

## 2022-12-05 NOTE — Therapy (Signed)
OUTPATIENT PHYSICAL THERAPY TREATMENT   Patient Name: Teresa [name] MRN: [id number] DOB:1940/04/13, 82 y.o., female Today's Date: 12/05/2022   PCP: Dr. Aram Beecham REFERRING PROVIDER: Dr. Aram Beecham  END OF SESSION:  PT End of Session - 12/05/22 1109     Visit Number 44    Number of Visits 63    Date for PT Re-Evaluation 01/28/23    Authorization Type [employer] PPO-    Authorization Time Period 08/13/22-11/05/22    Progress Note Due on Visit 50    PT Start Time 1102    PT Stop Time 1144    PT Time Calculation (min) 42 min    Equipment Utilized During Treatment Gait belt    Activity Tolerance Patient tolerated treatment well    Behavior During Therapy WFL for tasks assessed/performed                 Past Medical History:  Diagnosis Date   Cancer (HCC)    skin   Hypothyroidism    Raynaud's disease    Stroke [facility])    Past Surgical History:  Procedure Laterality Date   ABDOMINAL HYSTERECTOMY  1991   APPENDECTOMY  1991   BACK SURGERY  2010   Cervical fusion C4-5-6-7   CATARACT EXTRACTION, BILATERAL Bilateral 10/2016   CHOLECYSTECTOMY  1995   COLONOSCOPY WITH PROPOFOL N/A 12/08/2018   Procedure: COLONOSCOPY WITH PROPOFOL;  Surgeon: Toledo, Boykin Nearing, MD;  Location: [facility] ENDOSCOPY;  Service: Gastroenterology;  Laterality: N/A;   EYE SURGERY     Patient Active Problem List   Diagnosis Date Noted   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   Stroke (HCC) 02/27/2021   History of nonmelanoma skin cancer 05/23/2014    ONSET DATE: 04/16/2022  REFERRING DIAG: 69.354 (ICD-10-CM) - Hemiplegia and hemiparesis following cerebral infarction affecting left non-dominant side   THERAPY DIAG:  Unsteadiness on feet  Muscle weakness (generalized)  Difficulty in walking, not elsewhere classified  Other lack of coordination  Other abnormalities of gait and mobility  Rationale for Evaluation and Treatment: Rehabilitation  SUBJECTIVE:                                                                                                                                                                                              SUBJECTIVE STATEMENT:  Patient reports she went to see Dr. Sherryll Burger and he discontinued doxycycline and added another med. She reports feeling better overall.   Pt accompanied by: Husband  PERTINENT HISTORY: Pt is a 82 y.o. female with referral for hemiplegia with left sided weakness -original CVA on 02/26/21 and new  onset of left sided weakness on 04/16/2022.  R MCA CVA on 02/26/21. Pt completed outpatient PT on 01/17/2022 with majority of goals met. PMH includes: skin cancer, hypothyroidism, and Raynaud's disease, history of cervical fusion in 2010 C4-C7.   PAIN:  Are you having pain?  no  PRECAUTIONS: Fall  WEIGHT BEARING RESTRICTIONS: No  FALLS: Has patient fallen in last 6 months? No   PATIENT GOALS: I want to walk without this cane and no falls and enjoy going out to grandkids sporting event  OBJECTIVE:    TODAY'S TREATMENT:                                                                                                                              DATE: 12/05/22  BP= 153/74 mmHg Right arm sitting   Nustep- L2 BUE/LE x 5 min. VC to keep left arm on handle. Total distance= 0.43mi  Step up 3#  2 sets of 10 reps Side stepping in // bars- 3# down and back x6 (my hips are tired) Forward walk 3# (working on increasing step length) down and back x 6 Retro walk 3# (working on larger posterior steps)  down and back in // bars x 6 Sit to stand 2 sets of 10 reps without UE support.  Dynamic hip march walk in //  bars - 3# AW alt LE - down and back x 5      PATIENT EDUCATION: Education details: Pt educated throughout session about proper posture and technique with exercises. Improved exercise technique, movement at target joints, use of target muscles after min to mod verbal, visual, tactile cues.  Person  educated: Patient Education method: Explanation, Demonstration, Tactile cues, and Verbal cues Education comprehension: verbalized understanding, returned demonstration, verbal cues required, tactile cues required, and needs further education  HOME EXERCISE PROGRAM: Access Code: [email or web address] URL: [email or web address] Date: 10/17/2022 Prepared by: [name]  Exercises - Standing Marching  - 1 x daily - 7 x weekly - 1 sets - 20 reps - Side Stepping with Counter Support  - 1 x daily - 7 x weekly - 1 sets - 20 reps - Standing Tandem Balance with Counter Support  - 1 x daily - 7 x weekly - 2 sets - seconds  hold - Sit to Stand with Arms Crossed  - 1 x daily - 7 x weekly - 2 sets - 10 reps - Heel Raises with Counter Support  - 1 x daily - 7 x weekly - 2 sets - 20 reps  GOALS: Goals reviewed with patient? Yes  SHORT TERM GOALS: Target date: 07/04/2022  Patient will be independent in home exercise program to improve strength/mobility for better functional independence with ADLs  Baseline: EVAL: Patient reports mostly just walking; 07/09/22: Pt reports her exercises are going well, to be updated future date; 11/05/2022- Will now work with patient on HEP in gym setting Goal status: IN PROGRESS  LONG TERM GOALS: Target date: 01/28/2023  Pt will decrease 5TSTS by at least 6 seconds in order to demonstrate clinically significant improvement in LE strength. Baseline: EVAL= 23.62 sec without UE; 07/09/22:  13 seconds hands-free  Goal status: MET  2. Pt will improve FOTO to target score of 66 to display perceived improvements in ability to complete ADL's.  Baseline: Eval= 60; 07/09/22: 65 5/21: 59% 11/05/2022= 72 Goal status:  MET  3.  Pt will decrease TUG to below 14 seconds/decrease in order to demonstrate decreased fall risk. Baseline: Eval= 20.14 sec without AD; 07/09/22: 11 seconds no AD Goal status: MET  4.  Patient will increase six minute walk test distance to >1200 ft for  improve gait ability and return to PLOF to complete her walks in her neighborhood.  Baseline: EVAL= 915 feet without an AD; 07/09/22: 1184 ft without AD 5/21: 1060 ft; 11/05/2022= 1175 feet without an AD Goal status: Partially MET  5. Pt will increase by at least 0.13 m/s in order to demonstrate clinically significant improvement in community ambulation.    Baseline: Eval = 0.70 m/s; 07/09/2022: 1.04 m/s without AD   Goal status: MET   6. Pt will report that she has not hit LLE/LUE on doorframe/walls/furniture while ambulating in the past two weeks to indicate improved ability to scan environment and to decrease injury risk with gait.  Baseline: currently has hit furniture/doorframe 2x/week 5/21: hitting doorframe less frequently. 08/23/2022- Patient reports no bumping arms into any walls or furniture since last tested on 08/13/2022. 09/27/2022= Patient reports no bumping arms into any walls or furniture since last tested on 08/23/2022.   Goal status: MET  7. Patient will increase Functional Gait Assessment score to >20/30 as to reduce fall risk and improve dynamic gait safety with community ambulation.  Baseline:5/21: 13/30; 09/27/2022= 19/30; 11/05/2022= 26/30   Goal status: MET  8. Patient will transition from skilled PT services to independent with gym based exercise program to continue with her gains made in PT and perform at an independent level for optimal safety and improved quality of life.   Baseline: 11/05/2022= Patient with no knowledge of Strengthening or balance exercises in gym based  Setting.   Goal status: INITIAL  ASSESSMENT:  CLINICAL IMPRESSION: Patient presents with improving abilities and able to resume some LE strengthening and coordination exercises today. She presented with some fatigue after performing mostly 2 sets of activities today yet no worsening of coordination or balance. She continues to respond well to VC to improve her technique and no obvious LOB with  activities today. The pt will benefit from skilled PT services to improve her overall left LE muscle strength and improve her mobility to decrease her fall risk and improve her overall quality of life.         OBJECTIVE IMPAIRMENTS: Abnormal gait, decreased activity tolerance, decreased balance, decreased coordination, decreased endurance, decreased mobility, difficulty walking, and decreased strength.   ACTIVITY LIMITATIONS: carrying, lifting, bending, standing, squatting, stairs, and transfers  PARTICIPATION LIMITATIONS: cleaning, laundry, driving, shopping, community activity, and yard work  PERSONAL FACTORS: 3+ comorbidities: OA, HTN, hx of previous CVA  are also affecting patient's functional outcome.   REHAB POTENTIAL: Good  CLINICAL DECISION MAKING: Evolving/moderate complexity  EVALUATION COMPLEXITY: Moderate  PLAN:  PT FREQUENCY: 1-2x/week  PT DURATION: 12 weeks  PLANNED INTERVENTIONS: Therapeutic exercises, Therapeutic activity, Neuromuscular re-education, Balance training, Gait training, Patient/Family education, Self Care, Joint mobilization, Joint manipulation, Stair training, Vestibular training, Canalith repositioning, DME instructions, Dry Needling, Electrical stimulation, Spinal manipulation, Spinal  mobilization, Cryotherapy, Moist heat, Taping, and Manual therapy  PLAN FOR NEXT SESSION:  Ask about MD visit. Progress BLE strength & dynamic balance. Continue training in well zone next visit.    Lenda Kelp PT   2:54 PM, 12/05/22 Louis Meckel, PT Physical Therapist - [facility]  [phone] [facility])   [facility]  12/05/2022, 2:54 PM   Physical Therapist - [facility]  Outpatient Physical Therapy- Main Campus [phone]

## 2022-12-10 ENCOUNTER — Ambulatory Visit: Payer: Medicare HMO

## 2022-12-10 ENCOUNTER — Ambulatory Visit: Payer: Medicare PPO

## 2022-12-12 ENCOUNTER — Ambulatory Visit: Payer: Medicare HMO

## 2022-12-12 DIAGNOSIS — R2689 Other abnormalities of gait and mobility: Secondary | ICD-10-CM | POA: Diagnosis not present

## 2022-12-12 DIAGNOSIS — R278 Other lack of coordination: Secondary | ICD-10-CM

## 2022-12-12 DIAGNOSIS — R2681 Unsteadiness on feet: Secondary | ICD-10-CM

## 2022-12-12 DIAGNOSIS — I63511 Cerebral infarction due to unspecified occlusion or stenosis of right middle cerebral artery: Secondary | ICD-10-CM | POA: Diagnosis not present

## 2022-12-12 DIAGNOSIS — M6281 Muscle weakness (generalized): Secondary | ICD-10-CM

## 2022-12-12 DIAGNOSIS — R262 Difficulty in walking, not elsewhere classified: Secondary | ICD-10-CM | POA: Diagnosis not present

## 2022-12-12 NOTE — Therapy (Signed)
OUTPATIENT PHYSICAL THERAPY TREATMENT   Patient Name: Teresa [name] MRN: [id number] DOB:05/03/1940, 82 y.o., female Today's Date: 12/12/2022   PCP: Dr. Aram Beecham REFERRING PROVIDER: Dr. Aram Beecham  END OF SESSION:  PT End of Session - 12/12/22 1131     Visit Number 45    Number of Visits 63    Date for PT Re-Evaluation 01/28/23    Authorization Type [employer] PPO-    Authorization Time Period 08/13/22-11/05/22    Progress Note Due on Visit 50    PT Start Time 1100    PT Stop Time 1145    PT Time Calculation (min) 45 min    Equipment Utilized During Treatment Gait belt    Activity Tolerance Patient tolerated treatment well    Behavior During Therapy WFL for tasks assessed/performed                 Past Medical History:  Diagnosis Date   Cancer (HCC)    skin   Hypothyroidism    Raynaud's disease    Stroke [facility])    Past Surgical History:  Procedure Laterality Date   ABDOMINAL HYSTERECTOMY  1991   APPENDECTOMY  1991   BACK SURGERY  2010   Cervical fusion C4-5-6-7   CATARACT EXTRACTION, BILATERAL Bilateral 10/2016   CHOLECYSTECTOMY  1995   COLONOSCOPY WITH PROPOFOL N/A 12/08/2018   Procedure: COLONOSCOPY WITH PROPOFOL;  Surgeon: Toledo, Boykin Nearing, MD;  Location: [facility] ENDOSCOPY;  Service: Gastroenterology;  Laterality: N/A;   EYE SURGERY     Patient Active Problem List   Diagnosis Date Noted   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   Stroke (HCC) 02/27/2021   History of nonmelanoma skin cancer 05/23/2014    ONSET DATE: 04/16/2022  REFERRING DIAG: 69.354 (ICD-10-CM) - Hemiplegia and hemiparesis following cerebral infarction affecting left non-dominant side   THERAPY DIAG:  Unsteadiness on feet  Muscle weakness (generalized)  Difficulty in walking, not elsewhere classified  Other lack of coordination  Other abnormalities of gait and mobility  Right middle cerebral artery stroke (HCC)  Rationale for Evaluation and  Treatment: Rehabilitation  SUBJECTIVE:                                                                                                                                                                                             SUBJECTIVE STATEMENT:  Patient reports feeling better this week and states no further issues. She reports doing well trying to pick her left foot up.   Pt accompanied by: Husband  PERTINENT HISTORY: Pt is a 82 y.o. female with referral for hemiplegia with left  sided weakness -original CVA on 02/26/21 and new onset of left sided weakness on 04/16/2022.  R MCA CVA on 02/26/21. Pt completed outpatient PT on 01/17/2022 with majority of goals met. PMH includes: skin cancer, hypothyroidism, and Raynaud's disease, history of cervical fusion in 2010 C4-C7.   PAIN:  Are you having pain?  no  PRECAUTIONS: Fall  WEIGHT BEARING RESTRICTIONS: No  FALLS: Has patient fallen in last 6 months? No   PATIENT GOALS: I want to walk without this cane and no falls and enjoy going out to grandkids sporting event  OBJECTIVE:    TODAY'S TREATMENT:                                                                                                                              DATE: 12/12/22    TA:   Ambulation - in hospital on level surfaces using cane- focusing on reciprocal step and no foot drag. Performed some horizontal head turns- calling out words/labels on hospital walls and no significant LOB. She also performed ascending/descending steps 20+ in hospital stairway. She was able to perform with reciprocal steps and only 1 UE support on railing.  No LOB.  Side step up/over 1/2 foam x 20 reps without UE support Heel to toe activity- swinging Leg from toe off position to heel strike using 1/2 foam for feedback- Landing toe box on foam so heel makes contact with floor.  Forward step up/over 1/2 foam x 20 reps each way Sit to stand 2 sets of 10 reps without UE support.  Dynamic hip march  walk in //  bars -  down and back x 5 Dynamic walking in hallway with horizontal and verical head turning - no LOB- able to stay in middle of hallway well overall today.       PATIENT EDUCATION: Education details: Pt educated throughout session about proper posture and technique with exercises. Improved exercise technique, movement at target joints, use of target muscles after min to mod verbal, visual, tactile cues.  Person educated: Patient Education method: Explanation, Demonstration, Tactile cues, and Verbal cues Education comprehension: verbalized understanding, returned demonstration, verbal cues required, tactile cues required, and needs further education  HOME EXERCISE PROGRAM: Access Code: [email or web address] URL: [email or web address] Date: 10/17/2022 Prepared by: [name]  Exercises - Standing Marching  - 1 x daily - 7 x weekly - 1 sets - 20 reps - Side Stepping with Counter Support  - 1 x daily - 7 x weekly - 1 sets - 20 reps - Standing Tandem Balance with Counter Support  - 1 x daily - 7 x weekly - 2 sets - seconds  hold - Sit to Stand with Arms Crossed  - 1 x daily - 7 x weekly - 2 sets - 10 reps - Heel Raises with Counter Support  - 1 x daily - 7 x weekly - 2 sets - 20 reps  GOALS: Goals reviewed with patient? Yes  SHORT TERM  GOALS: Target date: 07/04/2022  Patient will be independent in home exercise program to improve strength/mobility for better functional independence with ADLs  Baseline: EVAL: Patient reports mostly just walking; 07/09/22: Pt reports her exercises are going well, to be updated future date; 11/05/2022- Will now work with patient on HEP in gym setting Goal status: IN PROGRESS  LONG TERM GOALS: Target date: 01/28/2023   Pt will decrease 5TSTS by at least 6 seconds in order to demonstrate clinically significant improvement in LE strength. Baseline: EVAL= 23.62 sec without UE; 07/09/22:  13 seconds hands-free  Goal status: MET  2. Pt  will improve FOTO to target score of 66 to display perceived improvements in ability to complete ADL's.  Baseline: Eval= 60; 07/09/22: 65 5/21: 59% 11/05/2022= 72 Goal status:  MET  3.  Pt will decrease TUG to below 14 seconds/decrease in order to demonstrate decreased fall risk. Baseline: Eval= 20.14 sec without AD; 07/09/22: 11 seconds no AD Goal status: MET  4.  Patient will increase six minute walk test distance to >1200 ft for improve gait ability and return to PLOF to complete her walks in her neighborhood.  Baseline: EVAL= 915 feet without an AD; 07/09/22: 1184 ft without AD 5/21: 1060 ft; 11/05/2022= 1175 feet without an AD Goal status: Partially MET  5. Pt will increase by at least 0.13 m/s in order to demonstrate clinically significant improvement in community ambulation.    Baseline: Eval = 0.70 m/s; 07/09/2022: 1.04 m/s without AD   Goal status: MET   6. Pt will report that she has not hit LLE/LUE on doorframe/walls/furniture while ambulating in the past two weeks to indicate improved ability to scan environment and to decrease injury risk with gait.  Baseline: currently has hit furniture/doorframe 2x/week 5/21: hitting doorframe less frequently. 08/23/2022- Patient reports no bumping arms into any walls or furniture since last tested on 08/13/2022. 09/27/2022= Patient reports no bumping arms into any walls or furniture since last tested on 08/23/2022.   Goal status: MET  7. Patient will increase Functional Gait Assessment score to >20/30 as to reduce fall risk and improve dynamic gait safety with community ambulation.  Baseline:5/21: 13/30; 09/27/2022= 19/30; 11/05/2022= 26/30   Goal status: MET  8. Patient will transition from skilled PT services to independent with gym based exercise program to continue with her gains made in PT and perform at an independent level for optimal safety and improved quality of life.   Baseline: 11/05/2022= Patient with no knowledge of Strengthening or  balance exercises in gym based  Setting.   Goal status: INITIAL  ASSESSMENT:  CLINICAL IMPRESSION: Patient walking into clinic with some left foot drag. Treatment focused on walking around hospital focusing on good steps- landing on left heel as able and some dynamic head motions to challenge her balance. She responded well with no significant LOB. She was challenged with coordination of step over- but much improved with practice. Some left leg drag remains most limiting factor to increased risk of falling.  The pt will benefit from skilled PT services to improve her overall left LE muscle strength and improve her mobility to decrease her fall risk and improve her overall quality of life.         OBJECTIVE IMPAIRMENTS: Abnormal gait, decreased activity tolerance, decreased balance, decreased coordination, decreased endurance, decreased mobility, difficulty walking, and decreased strength.   ACTIVITY LIMITATIONS: carrying, lifting, bending, standing, squatting, stairs, and transfers  PARTICIPATION LIMITATIONS: cleaning, laundry, driving, shopping, community activity, and yard  work  PERSONAL FACTORS: 3+ comorbidities: OA, HTN, hx of previous CVA  are also affecting patient's functional outcome.   REHAB POTENTIAL: Good  CLINICAL DECISION MAKING: Evolving/moderate complexity  EVALUATION COMPLEXITY: Moderate  PLAN:  PT FREQUENCY: 1-2x/week  PT DURATION: 12 weeks  PLANNED INTERVENTIONS: Therapeutic exercises, Therapeutic activity, Neuromuscular re-education, Balance training, Gait training, Patient/Family education, Self Care, Joint mobilization, Joint manipulation, Stair training, Vestibular training, Canalith repositioning, DME instructions, Dry Needling, Electrical stimulation, Spinal manipulation, Spinal mobilization, Cryotherapy, Moist heat, Taping, and Manual therapy  PLAN FOR NEXT SESSION:  Progress BLE strength & dynamic balance. Continue training in well zone next visit.     Lenda Kelp PT   12:04 PM, 12/12/22 Louis Meckel, PT Physical Therapist - [facility]  [phone] [facility])   [facility]  12/12/2022, 12:04 PM   Physical Therapist - [facility]  Outpatient Physical Therapy- Main Campus [phone]

## 2022-12-16 ENCOUNTER — Ambulatory Visit (INDEPENDENT_AMBULATORY_CARE_PROVIDER_SITE_OTHER): Payer: Medicare HMO

## 2022-12-16 DIAGNOSIS — I639 Cerebral infarction, unspecified: Secondary | ICD-10-CM

## 2022-12-17 ENCOUNTER — Ambulatory Visit: Payer: Medicare HMO

## 2022-12-17 ENCOUNTER — Ambulatory Visit: Payer: Medicare PPO

## 2022-12-19 ENCOUNTER — Ambulatory Visit: Payer: Medicare HMO

## 2022-12-23 DIAGNOSIS — I639 Cerebral infarction, unspecified: Secondary | ICD-10-CM

## 2022-12-24 ENCOUNTER — Ambulatory Visit: Payer: Medicare PPO

## 2022-12-24 ENCOUNTER — Ambulatory Visit: Payer: Medicare HMO

## 2022-12-26 ENCOUNTER — Ambulatory Visit: Payer: Medicare HMO

## 2022-12-30 NOTE — Progress Notes (Signed)
Carelink Summary Report / Loop Recorder 

## 2022-12-31 ENCOUNTER — Ambulatory Visit: Payer: Medicare PPO

## 2022-12-31 ENCOUNTER — Ambulatory Visit: Payer: Medicare HMO

## 2023-01-02 ENCOUNTER — Ambulatory Visit: Payer: Medicare HMO

## 2023-01-02 ENCOUNTER — Ambulatory Visit: Payer: Medicare HMO | Attending: Internal Medicine

## 2023-01-02 DIAGNOSIS — M6281 Muscle weakness (generalized): Secondary | ICD-10-CM | POA: Insufficient documentation

## 2023-01-02 DIAGNOSIS — R278 Other lack of coordination: Secondary | ICD-10-CM | POA: Insufficient documentation

## 2023-01-02 DIAGNOSIS — R2681 Unsteadiness on feet: Secondary | ICD-10-CM | POA: Diagnosis not present

## 2023-01-02 DIAGNOSIS — R262 Difficulty in walking, not elsewhere classified: Secondary | ICD-10-CM | POA: Diagnosis not present

## 2023-01-02 DIAGNOSIS — I63511 Cerebral infarction due to unspecified occlusion or stenosis of right middle cerebral artery: Secondary | ICD-10-CM | POA: Insufficient documentation

## 2023-01-02 DIAGNOSIS — R2689 Other abnormalities of gait and mobility: Secondary | ICD-10-CM | POA: Insufficient documentation

## 2023-01-02 NOTE — Therapy (Signed)
OUTPATIENT PHYSICAL THERAPY TREATMENT   Patient Name: Teresa [name] MRN: [id number] DOB:1940-05-24, 82 y.o., female Today's Date: 01/03/2023   PCP: Dr. Aram Beecham REFERRING PROVIDER: Dr. Aram Beecham  END OF SESSION:  PT End of Session - 01/02/23 1108     Visit Number 46    Number of Visits 63    Date for PT Re-Evaluation 01/28/23    Authorization Type [employer] PPO-    Authorization Time Period 08/13/22-11/05/22    Progress Note Due on Visit 50    PT Start Time 1100    PT Stop Time 1143    PT Time Calculation (min) 43 min    Equipment Utilized During Treatment Gait belt    Activity Tolerance Patient tolerated treatment well    Behavior During Therapy WFL for tasks assessed/performed                 Past Medical History:  Diagnosis Date   Cancer (HCC)    skin   Hypothyroidism    Raynaud's disease    Stroke [facility])    Past Surgical History:  Procedure Laterality Date   ABDOMINAL HYSTERECTOMY  1991   APPENDECTOMY  1991   BACK SURGERY  2010   Cervical fusion C4-5-6-7   CATARACT EXTRACTION, BILATERAL Bilateral 10/2016   CHOLECYSTECTOMY  1995   COLONOSCOPY WITH PROPOFOL N/A 12/08/2018   Procedure: COLONOSCOPY WITH PROPOFOL;  Surgeon: Toledo, Boykin Nearing, MD;  Location: [facility] ENDOSCOPY;  Service: Gastroenterology;  Laterality: N/A;   EYE SURGERY     Patient Active Problem List   Diagnosis Date Noted   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   Stroke (HCC) 02/27/2021   History of nonmelanoma skin cancer 05/23/2014    ONSET DATE: 04/16/2022  REFERRING DIAG: 69.354 (ICD-10-CM) - Hemiplegia and hemiparesis following cerebral infarction affecting left non-dominant side   THERAPY DIAG:  Unsteadiness on feet  Muscle weakness (generalized)  Difficulty in walking, not elsewhere classified  Other lack of coordination  Other abnormalities of gait and mobility  Right middle cerebral artery stroke (HCC)  Rationale for Evaluation  and Treatment: Rehabilitation  SUBJECTIVE:                                                                                                                                                                                             SUBJECTIVE STATEMENT:  Patient reports having a good time in [place]. Reports tired from all her travel but no further symptoms.  Pt accompanied by: Husband  PERTINENT HISTORY: Pt is a 82 y.o. female with referral for hemiplegia with left sided weakness -original CVA  on 02/26/21 and new onset of left sided weakness on 04/16/2022.  R MCA CVA on 02/26/21. Pt completed outpatient PT on 01/17/2022 with majority of goals met. PMH includes: skin cancer, hypothyroidism, and Raynaud's disease, history of cervical fusion in 2010 C4-C7.   PAIN:  Are you having pain?  no  PRECAUTIONS: Fall  WEIGHT BEARING RESTRICTIONS: No  FALLS: Has patient fallen in last 6 months? No   PATIENT GOALS: I want to walk without this cane and no falls and enjoy going out to grandkids sporting event  OBJECTIVE:    TODAY'S TREATMENT:                                                                                                                              DATE: 01/01/2023    Therex:   Resumed therex in [facility]:   Knee ext- L3 x 20 reps BLE x 1 set (Pateint reports as tiring)  Knee ext- L2 x 12 reps BLE x 1 set.   Knee flex- L2 x 12 reps x 3 sets (single leg) x each LE Leg press- L2 x 12 reps,  then L2 single LE x 2 sets Calf press -L2 x 12 reps BLE, single leg at L3 x 12 reps x 2 set L LE Standing hip abd (cable) 7# 2 sets of 10 reps each LE Standing hip ext (cable) 7# 2 sets of 10 reps each LE         PATIENT EDUCATION: Education details: Pt educated throughout session about proper posture and technique with exercises. Improved exercise technique, movement at target joints, use of target muscles after min to mod verbal, visual, tactile cues.  Person educated:  Patient Education method: Explanation, Demonstration, Tactile cues, and Verbal cues Education comprehension: verbalized understanding, returned demonstration, verbal cues required, tactile cues required, and needs further education  HOME EXERCISE PROGRAM: Access Code: [email or web address] URL: [email or web address] Date: 10/17/2022 Prepared by: [name]  Exercises - Standing Marching  - 1 x daily - 7 x weekly - 1 sets - 20 reps - Side Stepping with Counter Support  - 1 x daily - 7 x weekly - 1 sets - 20 reps - Standing Tandem Balance with Counter Support  - 1 x daily - 7 x weekly - 2 sets - seconds  hold - Sit to Stand with Arms Crossed  - 1 x daily - 7 x weekly - 2 sets - 10 reps - Heel Raises with Counter Support  - 1 x daily - 7 x weekly - 2 sets - 20 reps  GOALS: Goals reviewed with patient? Yes  SHORT TERM GOALS: Target date: 07/04/2022  Patient will be independent in home exercise program to improve strength/mobility for better functional independence with ADLs  Baseline: EVAL: Patient reports mostly just walking; 07/09/22: Pt reports her exercises are going well, to be updated future date; 11/05/2022- Will now work with patient on HEP in gym setting Goal status: IN PROGRESS  LONG TERM GOALS: Target date: 01/28/2023   Pt will decrease 5TSTS by at least 6 seconds in order to demonstrate clinically significant improvement in LE strength. Baseline: EVAL= 23.62 sec without UE; 07/09/22:  13 seconds hands-free  Goal status: MET  2. Pt will improve FOTO to target score of 66 to display perceived improvements in ability to complete ADL's.  Baseline: Eval= 60; 07/09/22: 65 5/21: 59% 11/05/2022= 72 Goal status:  MET  3.  Pt will decrease TUG to below 14 seconds/decrease in order to demonstrate decreased fall risk. Baseline: Eval= 20.14 sec without AD; 07/09/22: 11 seconds no AD Goal status: MET  4.  Patient will increase six minute walk test distance to >1200 ft for improve  gait ability and return to PLOF to complete her walks in her neighborhood.  Baseline: EVAL= 915 feet without an AD; 07/09/22: 1184 ft without AD 5/21: 1060 ft; 11/05/2022= 1175 feet without an AD Goal status: Partially MET  5. Pt will increase by at least 0.13 m/s in order to demonstrate clinically significant improvement in community ambulation.    Baseline: Eval = 0.70 m/s; 07/09/2022: 1.04 m/s without AD   Goal status: MET   6. Pt will report that she has not hit LLE/LUE on doorframe/walls/furniture while ambulating in the past two weeks to indicate improved ability to scan environment and to decrease injury risk with gait.  Baseline: currently has hit furniture/doorframe 2x/week 5/21: hitting doorframe less frequently. 08/23/2022- Patient reports no bumping arms into any walls or furniture since last tested on 08/13/2022. 09/27/2022= Patient reports no bumping arms into any walls or furniture since last tested on 08/23/2022.   Goal status: MET  7. Patient will increase Functional Gait Assessment score to >20/30 as to reduce fall risk and improve dynamic gait safety with community ambulation.  Baseline:5/21: 13/30; 09/27/2022= 19/30; 11/05/2022= 26/30   Goal status: MET  8. Patient will transition from skilled PT services to independent with gym based exercise program to continue with her gains made in PT and perform at an independent level for optimal safety and improved quality of life.   Baseline: 11/05/2022= Patient with no knowledge of Strengthening or balance exercises in gym based  Setting.   Goal status: INITIAL  ASSESSMENT:  CLINICAL IMPRESSION: Patient presents with good motivation for today's session. She was able to return to the wellzone today and some assistance required on how to negotiate equipment. She responded well to instruction in learning new Hip resistive strengthening and other than fatigue no complaints. The pt will benefit from skilled PT services to improve her overall  left LE muscle strength and improve her mobility to decrease her fall risk and improve her overall quality of life.         OBJECTIVE IMPAIRMENTS: Abnormal gait, decreased activity tolerance, decreased balance, decreased coordination, decreased endurance, decreased mobility, difficulty walking, and decreased strength.   ACTIVITY LIMITATIONS: carrying, lifting, bending, standing, squatting, stairs, and transfers  PARTICIPATION LIMITATIONS: cleaning, laundry, driving, shopping, community activity, and yard work  PERSONAL FACTORS: 3+ comorbidities: OA, HTN, hx of previous CVA  are also affecting patient's functional outcome.   REHAB POTENTIAL: Good  CLINICAL DECISION MAKING: Evolving/moderate complexity  EVALUATION COMPLEXITY: Moderate  PLAN:  PT FREQUENCY: 1-2x/week  PT DURATION: 12 weeks  PLANNED INTERVENTIONS: Therapeutic exercises, Therapeutic activity, Neuromuscular re-education, Balance training, Gait training, Patient/Family education, Self Care, Joint mobilization, Joint manipulation, Stair training, Vestibular training, Canalith repositioning, DME instructions, Dry Needling, Electrical stimulation, Spinal manipulation, Spinal mobilization, Cryotherapy, Moist  heat, Taping, and Manual therapy  PLAN FOR NEXT SESSION:  Progress BLE strength & dynamic balance. Continue training in well zone next visit.    Lenda Kelp PT   7:46 AM, 01/03/23 Louis Meckel, PT Physical Therapist - [facility]  [phone] [facility])   [facility]  01/03/2023, 7:46 AM   Physical Therapist - [facility]  Outpatient Physical Therapy- Main Campus [phone]

## 2023-01-07 ENCOUNTER — Ambulatory Visit: Payer: Medicare PPO

## 2023-01-07 ENCOUNTER — Ambulatory Visit: Payer: Medicare HMO

## 2023-01-08 DIAGNOSIS — H4922 Sixth [abducent] nerve palsy, left eye: Secondary | ICD-10-CM | POA: Diagnosis not present

## 2023-01-08 DIAGNOSIS — H532 Diplopia: Secondary | ICD-10-CM | POA: Diagnosis not present

## 2023-01-09 ENCOUNTER — Ambulatory Visit: Payer: Medicare HMO

## 2023-01-09 DIAGNOSIS — M6281 Muscle weakness (generalized): Secondary | ICD-10-CM | POA: Diagnosis not present

## 2023-01-09 DIAGNOSIS — R2681 Unsteadiness on feet: Secondary | ICD-10-CM

## 2023-01-09 DIAGNOSIS — R2689 Other abnormalities of gait and mobility: Secondary | ICD-10-CM

## 2023-01-09 DIAGNOSIS — R262 Difficulty in walking, not elsewhere classified: Secondary | ICD-10-CM | POA: Diagnosis not present

## 2023-01-09 DIAGNOSIS — R278 Other lack of coordination: Secondary | ICD-10-CM | POA: Diagnosis not present

## 2023-01-09 DIAGNOSIS — I63511 Cerebral infarction due to unspecified occlusion or stenosis of right middle cerebral artery: Secondary | ICD-10-CM | POA: Diagnosis not present

## 2023-01-09 NOTE — Therapy (Signed)
OUTPATIENT PHYSICAL THERAPY TREATMENT   Patient Name: Teresa [name] MRN: [id number] DOB:06-10-1940, 82 y.o., female Today's Date: 01/09/2023   PCP: Dr. Aram Beecham REFERRING PROVIDER: Dr. Aram Beecham  END OF SESSION:  PT End of Session - 01/09/23 1108     Visit Number 47    Number of Visits 63    Date for PT Re-Evaluation 01/28/23    Authorization Type [employer] PPO-    Authorization Time Period 08/13/22-11/05/22    Progress Note Due on Visit 50    PT Start Time 1102    PT Stop Time 1144    PT Time Calculation (min) 42 min    Equipment Utilized During Treatment Gait belt    Activity Tolerance Patient tolerated treatment well    Behavior During Therapy WFL for tasks assessed/performed                 Past Medical History:  Diagnosis Date   Cancer (HCC)    skin   Hypothyroidism    Raynaud's disease    Stroke [facility])    Past Surgical History:  Procedure Laterality Date   ABDOMINAL HYSTERECTOMY  1991   APPENDECTOMY  1991   BACK SURGERY  2010   Cervical fusion C4-5-6-7   CATARACT EXTRACTION, BILATERAL Bilateral 10/2016   CHOLECYSTECTOMY  1995   COLONOSCOPY WITH PROPOFOL N/A 12/08/2018   Procedure: COLONOSCOPY WITH PROPOFOL;  Surgeon: Toledo, Boykin Nearing, MD;  Location: [facility] ENDOSCOPY;  Service: Gastroenterology;  Laterality: N/A;   EYE SURGERY     Patient Active Problem List   Diagnosis Date Noted   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   Stroke (HCC) 02/27/2021   History of nonmelanoma skin cancer 05/23/2014    ONSET DATE: 04/16/2022  REFERRING DIAG: 69.354 (ICD-10-CM) - Hemiplegia and hemiparesis following cerebral infarction affecting left non-dominant side   THERAPY DIAG:  Unsteadiness on feet  Muscle weakness (generalized)  Difficulty in walking, not elsewhere classified  Other lack of coordination  Other abnormalities of gait and mobility  Right middle cerebral artery stroke (HCC)  Rationale for Evaluation  and Treatment: Rehabilitation  SUBJECTIVE:                                                                                                                                                                                             SUBJECTIVE STATEMENT:  Patient reports doing well overall. States undecided on which gym she may join  Pt accompanied by: Husband  PERTINENT HISTORY: Pt is a 82 y.o. female with referral for hemiplegia with left sided weakness -original CVA on 02/26/21 and new onset of  left sided weakness on 04/16/2022.  R MCA CVA on 02/26/21. Pt completed outpatient PT on 01/17/2022 with majority of goals met. PMH includes: skin cancer, hypothyroidism, and Raynaud's disease, history of cervical fusion in 2010 C4-C7.   PAIN:  Are you having pain?  no  PRECAUTIONS: Fall  WEIGHT BEARING RESTRICTIONS: No  FALLS: Has patient fallen in last 6 months? No   PATIENT GOALS: I want to walk without this cane and no falls and enjoy going out to grandkids sporting event  OBJECTIVE:    TODAY'S TREATMENT:                                                                                                                              DATE: 01/09/23   Therex:    Blake Divine: continued assist to use equipment properly- reminders on frequency, counting, breathing and purpose of each exercise.   Knee ext- L2x 12 reps  each LE x 1 set  Knee ext- L3 x 10 reps each LE Knee flex- L3 x 12 reps x 3 sets (single leg) x each LE Leg press- L3 x 12 reps, BLE,   then L2 single LE x 2 sets x 12 reps Calf press -L2 x 12 reps single leg at L3 x 12 reps x 2 set L LE Standing hip ext (cable) 10# 2 sets of 10 reps each LE and 1 set on right LE at 23#         PATIENT EDUCATION: Education details: Pt educated throughout session about proper posture and technique with exercises. Improved exercise technique, movement at target joints, use of target muscles after min to mod verbal, visual, tactile  cues.  Person educated: Patient Education method: Explanation, Demonstration, Tactile cues, and Verbal cues Education comprehension: verbalized understanding, returned demonstration, verbal cues required, tactile cues required, and needs further education  HOME EXERCISE PROGRAM: Access Code: [email or web address] URL: [email or web address] Date: 10/17/2022 Prepared by: [name]  Exercises - Standing Marching  - 1 x daily - 7 x weekly - 1 sets - 20 reps - Side Stepping with Counter Support  - 1 x daily - 7 x weekly - 1 sets - 20 reps - Standing Tandem Balance with Counter Support  - 1 x daily - 7 x weekly - 2 sets - seconds  hold - Sit to Stand with Arms Crossed  - 1 x daily - 7 x weekly - 2 sets - 10 reps - Heel Raises with Counter Support  - 1 x daily - 7 x weekly - 2 sets - 20 reps  GOALS: Goals reviewed with patient? Yes  SHORT TERM GOALS: Target date: 07/04/2022  Patient will be independent in home exercise program to improve strength/mobility for better functional independence with ADLs  Baseline: EVAL: Patient reports mostly just walking; 07/09/22: Pt reports her exercises are going well, to be updated future date; 11/05/2022- Will now work with patient on HEP in gym setting Goal status: IN PROGRESS  LONG TERM GOALS: Target date: 01/28/2023   Pt will decrease 5TSTS by at least 6 seconds in order to demonstrate clinically significant improvement in LE strength. Baseline: EVAL= 23.62 sec without UE; 07/09/22:  13 seconds hands-free  Goal status: MET  2. Pt will improve FOTO to target score of 66 to display perceived improvements in ability to complete ADL's.  Baseline: Eval= 60; 07/09/22: 65 5/21: 59% 11/05/2022= 72 Goal status:  MET  3.  Pt will decrease TUG to below 14 seconds/decrease in order to demonstrate decreased fall risk. Baseline: Eval= 20.14 sec without AD; 07/09/22: 11 seconds no AD Goal status: MET  4.  Patient will increase six minute walk test distance  to >1200 ft for improve gait ability and return to PLOF to complete her walks in her neighborhood.  Baseline: EVAL= 915 feet without an AD; 07/09/22: 1184 ft without AD 5/21: 1060 ft; 11/05/2022= 1175 feet without an AD Goal status: Partially MET  5. Pt will increase by at least 0.13 m/s in order to demonstrate clinically significant improvement in community ambulation.    Baseline: Eval = 0.70 m/s; 07/09/2022: 1.04 m/s without AD   Goal status: MET   6. Pt will report that she has not hit LLE/LUE on doorframe/walls/furniture while ambulating in the past two weeks to indicate improved ability to scan environment and to decrease injury risk with gait.  Baseline: currently has hit furniture/doorframe 2x/week 5/21: hitting doorframe less frequently. 08/23/2022- Patient reports no bumping arms into any walls or furniture since last tested on 08/13/2022. 09/27/2022= Patient reports no bumping arms into any walls or furniture since last tested on 08/23/2022.   Goal status: MET  7. Patient will increase Functional Gait Assessment score to >20/30 as to reduce fall risk and improve dynamic gait safety with community ambulation.  Baseline:5/21: 13/30; 09/27/2022= 19/30; 11/05/2022= 26/30   Goal status: MET  8. Patient will transition from skilled PT services to independent with gym based exercise program to continue with her gains made in PT and perform at an independent level for optimal safety and improved quality of life.   Baseline: 11/05/2022= Patient with no knowledge of Strengthening or balance exercises in gym based  Setting.   Goal status: INITIAL  ASSESSMENT:  CLINICAL IMPRESSION: Patient continues to require some verbal prompts and physical  on how to negotiate equipment. She was able to add resistance today without report of pain and only min difficulty. She is progressing with knowledge of exercises including purpose and rationale of frequency and duration but will benefit from further training  The pt will benefit from skilled PT services to improve her overall left LE muscle strength and improve her mobility to decrease her fall risk and improve her overall quality of life.         OBJECTIVE IMPAIRMENTS: Abnormal gait, decreased activity tolerance, decreased balance, decreased coordination, decreased endurance, decreased mobility, difficulty walking, and decreased strength.   ACTIVITY LIMITATIONS: carrying, lifting, bending, standing, squatting, stairs, and transfers  PARTICIPATION LIMITATIONS: cleaning, laundry, driving, shopping, community activity, and yard work  PERSONAL FACTORS: 3+ comorbidities: OA, HTN, hx of previous CVA  are also affecting patient's functional outcome.   REHAB POTENTIAL: Good  CLINICAL DECISION MAKING: Evolving/moderate complexity  EVALUATION COMPLEXITY: Moderate  PLAN:  PT FREQUENCY: 1-2x/week  PT DURATION: 12 weeks  PLANNED INTERVENTIONS: Therapeutic exercises, Therapeutic activity, Neuromuscular re-education, Balance training, Gait training, Patient/Family education, Self Care, Joint mobilization, Joint manipulation, Stair training, Vestibular training, Canalith repositioning, DME instructions, Dry Needling,  Electrical stimulation, Spinal manipulation, Spinal mobilization, Cryotherapy, Moist heat, Taping, and Manual therapy  PLAN FOR NEXT SESSION:  Progress BLE strength & dynamic balance. Continue training in well zone next visit.    Merry Lofty Miracle Mongillo PT   1:26 PM, 01/09/23 Louis Meckel, PT Physical Therapist - [facility]  [phone] [facility])   [facility]  01/09/2023, 1:26 PM   Physical Therapist - [facility]  Outpatient Physical Therapy- Main Campus [phone]

## 2023-01-14 ENCOUNTER — Ambulatory Visit: Payer: Medicare PPO

## 2023-01-14 ENCOUNTER — Ambulatory Visit: Payer: Medicare HMO

## 2023-01-16 ENCOUNTER — Ambulatory Visit: Payer: Medicare HMO

## 2023-01-18 DIAGNOSIS — R569 Unspecified convulsions: Secondary | ICD-10-CM | POA: Diagnosis not present

## 2023-01-20 ENCOUNTER — Ambulatory Visit (INDEPENDENT_AMBULATORY_CARE_PROVIDER_SITE_OTHER): Payer: Medicare HMO

## 2023-01-20 DIAGNOSIS — I639 Cerebral infarction, unspecified: Secondary | ICD-10-CM | POA: Diagnosis not present

## 2023-01-21 ENCOUNTER — Ambulatory Visit: Payer: Medicare HMO

## 2023-01-21 LAB — CUP PACEART REMOTE DEVICE CHECK
Date Time Interrogation Session: 20241027231309
Implantable Pulse Generator Implant Date: 19440924

## 2023-01-22 DIAGNOSIS — G8194 Hemiplegia, unspecified affecting left nondominant side: Secondary | ICD-10-CM | POA: Diagnosis not present

## 2023-01-23 ENCOUNTER — Ambulatory Visit: Payer: Medicare HMO

## 2023-01-23 DIAGNOSIS — R278 Other lack of coordination: Secondary | ICD-10-CM | POA: Diagnosis not present

## 2023-01-23 DIAGNOSIS — R262 Difficulty in walking, not elsewhere classified: Secondary | ICD-10-CM

## 2023-01-23 DIAGNOSIS — R2689 Other abnormalities of gait and mobility: Secondary | ICD-10-CM | POA: Diagnosis not present

## 2023-01-23 DIAGNOSIS — I63511 Cerebral infarction due to unspecified occlusion or stenosis of right middle cerebral artery: Secondary | ICD-10-CM | POA: Diagnosis not present

## 2023-01-23 DIAGNOSIS — M6281 Muscle weakness (generalized): Secondary | ICD-10-CM

## 2023-01-23 DIAGNOSIS — R2681 Unsteadiness on feet: Secondary | ICD-10-CM

## 2023-01-23 NOTE — Therapy (Signed)
Electrical stimulation, Spinal manipulation, Spinal mobilization, Cryotherapy, Moist heat, Taping, and Manual therapy  PLAN FOR NEXT SESSION:  Progress BLE strength & dynamic balance. Continue training in well zone next visit.    Lenda Kelp PT   4:58 PM, 01/23/23 Louis Meckel, PT Physical Therapist - [facility]  [phone] [facility]   [facility]  01/23/2023, 4:58 PM   Physical Therapist - [facility]  Outpatient Physical Therapy- [facility] [phone]    OUTPATIENT PHYSICAL THERAPY TREATMENT   Patient Name: Teresa [name] MRN: [id number] DOB:[birth year], 82 y.o., female Today's Date: 01/23/2023   PCP: Dr.  Aram Beecham REFERRING PROVIDER: Dr. Aram Beecham  END OF SESSION:  PT End of Session - 01/23/23 1200     Visit Number 48    Number of Visits 63    Date for PT Re-Evaluation 01/28/23    Authorization Type [employer] PPO-    Authorization Time Period 08/13/22-11/05/22    Progress Note Due on Visit 50    PT Start Time 1148    PT Stop Time 1228    PT Time Calculation (min) 40 min    Equipment Utilized During Treatment Gait belt    Activity Tolerance Patient tolerated treatment well    Behavior During Therapy WFL for tasks assessed/performed                  Past Medical History:  Diagnosis Date   Cancer (HCC)    skin   Hypothyroidism    Raynaud's disease    Stroke [facility])    Past Surgical History:  Procedure Laterality Date   ABDOMINAL HYSTERECTOMY  1991   APPENDECTOMY  1991   BACK SURGERY  2010   Cervical fusion C4-5-6-7   CATARACT EXTRACTION, BILATERAL Bilateral 10/2016   CHOLECYSTECTOMY  1995   COLONOSCOPY WITH PROPOFOL N/A 12/08/2018   Procedure: COLONOSCOPY WITH PROPOFOL;  Surgeon: Toledo, Boykin Nearing, MD;  Location: [facility] ENDOSCOPY;  Service: Gastroenterology;  Laterality: N/A;   EYE SURGERY     Patient Active Problem List   Diagnosis Date Noted   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  Electrical stimulation, Spinal manipulation, Spinal mobilization, Cryotherapy, Moist heat, Taping, and Manual therapy  PLAN FOR NEXT SESSION:  Progress BLE strength & dynamic balance. Continue training in well zone next visit.    Lenda Kelp PT   4:58 PM, 01/23/23 Louis Meckel, PT Physical Therapist - [facility]  [phone] [facility]   [facility]  01/23/2023, 4:58 PM   Physical Therapist - [facility]  Outpatient Physical Therapy- [facility] [phone]    OUTPATIENT PHYSICAL THERAPY TREATMENT   Patient Name: Teresa [name] MRN: [id number] DOB:[birth year], 82 y.o., female Today's Date: 01/23/2023   PCP: Dr.  Aram Beecham REFERRING PROVIDER: Dr. Aram Beecham  END OF SESSION:  PT End of Session - 01/23/23 1200     Visit Number 48    Number of Visits 63    Date for PT Re-Evaluation 01/28/23    Authorization Type [employer] PPO-    Authorization Time Period 08/13/22-11/05/22    Progress Note Due on Visit 50    PT Start Time 1148    PT Stop Time 1228    PT Time Calculation (min) 40 min    Equipment Utilized During Treatment Gait belt    Activity Tolerance Patient tolerated treatment well    Behavior During Therapy WFL for tasks assessed/performed                  Past Medical History:  Diagnosis Date   Cancer (HCC)    skin   Hypothyroidism    Raynaud's disease    Stroke [facility])    Past Surgical History:  Procedure Laterality Date   ABDOMINAL HYSTERECTOMY  1991   APPENDECTOMY  1991   BACK SURGERY  2010   Cervical fusion C4-5-6-7   CATARACT EXTRACTION, BILATERAL Bilateral 10/2016   CHOLECYSTECTOMY  1995   COLONOSCOPY WITH PROPOFOL N/A 12/08/2018   Procedure: COLONOSCOPY WITH PROPOFOL;  Surgeon: Toledo, Boykin Nearing, MD;  Location: [facility] ENDOSCOPY;  Service: Gastroenterology;  Laterality: N/A;   EYE SURGERY     Patient Active Problem List   Diagnosis Date Noted   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  Electrical stimulation, Spinal manipulation, Spinal mobilization, Cryotherapy, Moist heat, Taping, and Manual therapy  PLAN FOR NEXT SESSION:  Progress BLE strength & dynamic balance. Continue training in well zone next visit.    Lenda Kelp PT   4:58 PM, 01/23/23 Louis Meckel, PT Physical Therapist - [facility]  [phone] [facility]   [facility]  01/23/2023, 4:58 PM   Physical Therapist - [facility]  Outpatient Physical Therapy- [facility] [phone]    OUTPATIENT PHYSICAL THERAPY TREATMENT   Patient Name: Teresa [name] MRN: [id number] DOB:[birth year], 82 y.o., female Today's Date: 01/23/2023   PCP: Dr.  Aram Beecham REFERRING PROVIDER: Dr. Aram Beecham  END OF SESSION:  PT End of Session - 01/23/23 1200     Visit Number 48    Number of Visits 63    Date for PT Re-Evaluation 01/28/23    Authorization Type [employer] PPO-    Authorization Time Period 08/13/22-11/05/22    Progress Note Due on Visit 50    PT Start Time 1148    PT Stop Time 1228    PT Time Calculation (min) 40 min    Equipment Utilized During Treatment Gait belt    Activity Tolerance Patient tolerated treatment well    Behavior During Therapy WFL for tasks assessed/performed                  Past Medical History:  Diagnosis Date   Cancer (HCC)    skin   Hypothyroidism    Raynaud's disease    Stroke [facility])    Past Surgical History:  Procedure Laterality Date   ABDOMINAL HYSTERECTOMY  1991   APPENDECTOMY  1991   BACK SURGERY  2010   Cervical fusion C4-5-6-7   CATARACT EXTRACTION, BILATERAL Bilateral 10/2016   CHOLECYSTECTOMY  1995   COLONOSCOPY WITH PROPOFOL N/A 12/08/2018   Procedure: COLONOSCOPY WITH PROPOFOL;  Surgeon: Toledo, Boykin Nearing, MD;  Location: [facility] ENDOSCOPY;  Service: Gastroenterology;  Laterality: N/A;   EYE SURGERY     Patient Active Problem List   Diagnosis Date Noted   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  OUTPATIENT PHYSICAL THERAPY TREATMENT   Patient Name: Teresa [name] MRN: [id number] DOB:Mar 31, 1940, 82 y.o., female Today's Date: 01/23/2023   PCP: Dr. Aram Beecham REFERRING PROVIDER: Dr. Aram Beecham  END OF SESSION:  PT End of Session - 01/23/23 1200     Visit Number 48    Number of Visits 63    Date for PT Re-Evaluation 01/28/23    Authorization Type [employer] PPO-    Authorization Time Period 08/13/22-11/05/22    Progress Note Due on Visit 50    PT Start Time 1148    PT Stop Time 1228    PT Time Calculation (min) 40 min    Equipment Utilized During Treatment Gait belt    Activity Tolerance Patient tolerated treatment well    Behavior During Therapy WFL for tasks assessed/performed                 Past Medical History:  Diagnosis Date   Cancer (HCC)    skin   Hypothyroidism    Raynaud's disease    Stroke [facility])    Past Surgical History:  Procedure Laterality Date   ABDOMINAL HYSTERECTOMY  1991   APPENDECTOMY  1991   BACK SURGERY  2010   Cervical fusion C4-5-6-7   CATARACT EXTRACTION, BILATERAL Bilateral 10/2016   CHOLECYSTECTOMY  1995   COLONOSCOPY WITH PROPOFOL N/A 12/08/2018   Procedure: COLONOSCOPY WITH PROPOFOL;  Surgeon: Toledo, Boykin Nearing, MD;  Location: [facility] ENDOSCOPY;  Service: Gastroenterology;  Laterality: N/A;   EYE SURGERY     Patient Active Problem List   Diagnosis Date Noted   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   Stroke (HCC) 02/27/2021   History of nonmelanoma skin cancer 05/23/2014    ONSET DATE: 04/16/2022  REFERRING DIAG: 69.354 (ICD-10-CM) - Hemiplegia and hemiparesis following cerebral infarction affecting left non-dominant side   THERAPY DIAG:  Unsteadiness on feet  Muscle weakness (generalized)  Difficulty in walking, not elsewhere classified  Other lack of coordination  Other abnormalities of gait and mobility  Right middle cerebral artery stroke (HCC)  Rationale for Evaluation  and Treatment: Rehabilitation  SUBJECTIVE:                                                                                                                                                                                             SUBJECTIVE STATEMENT:  Patient reports doing well overall. States undecided on which gym she may join  Pt accompanied by: Husband  PERTINENT HISTORY: Pt is a 82 y.o. female with referral for hemiplegia with left sided weakness -original CVA on 02/26/21 and new onset of  Electrical stimulation, Spinal manipulation, Spinal mobilization, Cryotherapy, Moist heat, Taping, and Manual therapy  PLAN FOR NEXT SESSION:  Progress BLE strength & dynamic balance. Continue training in well zone next visit.    Lenda Kelp PT   4:58 PM, 01/23/23 Louis Meckel, PT Physical Therapist - [facility]  [phone] [facility]   [facility]  01/23/2023, 4:58 PM   Physical Therapist - [facility]  Outpatient Physical Therapy- [facility] [phone]    OUTPATIENT PHYSICAL THERAPY TREATMENT   Patient Name: Teresa [name] MRN: [id number] DOB:[birth year], 82 y.o., female Today's Date: 01/23/2023   PCP: Dr.  Aram Beecham REFERRING PROVIDER: Dr. Aram Beecham  END OF SESSION:  PT End of Session - 01/23/23 1200     Visit Number 48    Number of Visits 63    Date for PT Re-Evaluation 01/28/23    Authorization Type [employer] PPO-    Authorization Time Period 08/13/22-11/05/22    Progress Note Due on Visit 50    PT Start Time 1148    PT Stop Time 1228    PT Time Calculation (min) 40 min    Equipment Utilized During Treatment Gait belt    Activity Tolerance Patient tolerated treatment well    Behavior During Therapy WFL for tasks assessed/performed                  Past Medical History:  Diagnosis Date   Cancer (HCC)    skin   Hypothyroidism    Raynaud's disease    Stroke [facility])    Past Surgical History:  Procedure Laterality Date   ABDOMINAL HYSTERECTOMY  1991   APPENDECTOMY  1991   BACK SURGERY  2010   Cervical fusion C4-5-6-7   CATARACT EXTRACTION, BILATERAL Bilateral 10/2016   CHOLECYSTECTOMY  1995   COLONOSCOPY WITH PROPOFOL N/A 12/08/2018   Procedure: COLONOSCOPY WITH PROPOFOL;  Surgeon: Toledo, Boykin Nearing, MD;  Location: [facility] ENDOSCOPY;  Service: Gastroenterology;  Laterality: N/A;   EYE SURGERY     Patient Active Problem List   Diagnosis Date Noted   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  OUTPATIENT PHYSICAL THERAPY TREATMENT   Patient Name: Teresa [name] MRN: [id number] DOB:Mar 31, 1940, 82 y.o., female Today's Date: 01/23/2023   PCP: Dr. Aram Beecham REFERRING PROVIDER: Dr. Aram Beecham  END OF SESSION:  PT End of Session - 01/23/23 1200     Visit Number 48    Number of Visits 63    Date for PT Re-Evaluation 01/28/23    Authorization Type [employer] PPO-    Authorization Time Period 08/13/22-11/05/22    Progress Note Due on Visit 50    PT Start Time 1148    PT Stop Time 1228    PT Time Calculation (min) 40 min    Equipment Utilized During Treatment Gait belt    Activity Tolerance Patient tolerated treatment well    Behavior During Therapy WFL for tasks assessed/performed                 Past Medical History:  Diagnosis Date   Cancer (HCC)    skin   Hypothyroidism    Raynaud's disease    Stroke [facility])    Past Surgical History:  Procedure Laterality Date   ABDOMINAL HYSTERECTOMY  1991   APPENDECTOMY  1991   BACK SURGERY  2010   Cervical fusion C4-5-6-7   CATARACT EXTRACTION, BILATERAL Bilateral 10/2016   CHOLECYSTECTOMY  1995   COLONOSCOPY WITH PROPOFOL N/A 12/08/2018   Procedure: COLONOSCOPY WITH PROPOFOL;  Surgeon: Toledo, Boykin Nearing, MD;  Location: [facility] ENDOSCOPY;  Service: Gastroenterology;  Laterality: N/A;   EYE SURGERY     Patient Active Problem List   Diagnosis Date Noted   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   Stroke (HCC) 02/27/2021   History of nonmelanoma skin cancer 05/23/2014    ONSET DATE: 04/16/2022  REFERRING DIAG: 69.354 (ICD-10-CM) - Hemiplegia and hemiparesis following cerebral infarction affecting left non-dominant side   THERAPY DIAG:  Unsteadiness on feet  Muscle weakness (generalized)  Difficulty in walking, not elsewhere classified  Other lack of coordination  Other abnormalities of gait and mobility  Right middle cerebral artery stroke (HCC)  Rationale for Evaluation  and Treatment: Rehabilitation  SUBJECTIVE:                                                                                                                                                                                             SUBJECTIVE STATEMENT:  Patient reports doing well overall. States undecided on which gym she may join  Pt accompanied by: Husband  PERTINENT HISTORY: Pt is a 82 y.o. female with referral for hemiplegia with left sided weakness -original CVA on 02/26/21 and new onset of  Electrical stimulation, Spinal manipulation, Spinal mobilization, Cryotherapy, Moist heat, Taping, and Manual therapy  PLAN FOR NEXT SESSION:  Progress BLE strength & dynamic balance. Continue training in well zone next visit.    Lenda Kelp PT   4:58 PM, 01/23/23 Louis Meckel, PT Physical Therapist - [facility]  [phone] [facility]   [facility]  01/23/2023, 4:58 PM   Physical Therapist - [facility]  Outpatient Physical Therapy- [facility] [phone]    OUTPATIENT PHYSICAL THERAPY TREATMENT   Patient Name: Teresa [name] MRN: [id number] DOB:[birth year], 82 y.o., female Today's Date: 01/23/2023   PCP: Dr.  Aram Beecham REFERRING PROVIDER: Dr. Aram Beecham  END OF SESSION:  PT End of Session - 01/23/23 1200     Visit Number 48    Number of Visits 63    Date for PT Re-Evaluation 01/28/23    Authorization Type [employer] PPO-    Authorization Time Period 08/13/22-11/05/22    Progress Note Due on Visit 50    PT Start Time 1148    PT Stop Time 1228    PT Time Calculation (min) 40 min    Equipment Utilized During Treatment Gait belt    Activity Tolerance Patient tolerated treatment well    Behavior During Therapy WFL for tasks assessed/performed                  Past Medical History:  Diagnosis Date   Cancer (HCC)    skin   Hypothyroidism    Raynaud's disease    Stroke [facility])    Past Surgical History:  Procedure Laterality Date   ABDOMINAL HYSTERECTOMY  1991   APPENDECTOMY  1991   BACK SURGERY  2010   Cervical fusion C4-5-6-7   CATARACT EXTRACTION, BILATERAL Bilateral 10/2016   CHOLECYSTECTOMY  1995   COLONOSCOPY WITH PROPOFOL N/A 12/08/2018   Procedure: COLONOSCOPY WITH PROPOFOL;  Surgeon: Toledo, Boykin Nearing, MD;  Location: [facility] ENDOSCOPY;  Service: Gastroenterology;  Laterality: N/A;   EYE SURGERY     Patient Active Problem List   Diagnosis Date Noted   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

## 2023-01-28 ENCOUNTER — Ambulatory Visit: Payer: Medicare HMO

## 2023-01-29 ENCOUNTER — Encounter: Payer: Medicare PPO | Admitting: Dermatology

## 2023-01-30 ENCOUNTER — Ambulatory Visit: Payer: Medicare HMO | Attending: Internal Medicine

## 2023-01-30 DIAGNOSIS — R2681 Unsteadiness on feet: Secondary | ICD-10-CM | POA: Insufficient documentation

## 2023-01-30 DIAGNOSIS — R278 Other lack of coordination: Secondary | ICD-10-CM | POA: Diagnosis not present

## 2023-01-30 DIAGNOSIS — R262 Difficulty in walking, not elsewhere classified: Secondary | ICD-10-CM | POA: Diagnosis not present

## 2023-01-30 DIAGNOSIS — R2689 Other abnormalities of gait and mobility: Secondary | ICD-10-CM | POA: Insufficient documentation

## 2023-01-30 DIAGNOSIS — M6281 Muscle weakness (generalized): Secondary | ICD-10-CM | POA: Insufficient documentation

## 2023-01-30 NOTE — Therapy (Signed)
OUTPATIENT PHYSICAL THERAPY TREATMENT/RECERT/PT DISCHARGE SUMMARY   Patient Name: Teresa [name] MRN: [id number] DOB:1940/09/28, 82 y.o., female Today's Date: 01/30/2023   PCP: Dr. Aram Beecham REFERRING PROVIDER: Dr. Aram Beecham  END OF SESSION:  PT End of Session - 01/30/23 1021     Visit Number 49    Number of Visits 49    Date for PT Re-Evaluation 01/30/23    Authorization Type [employer] PPO-    Authorization Time Period 08/13/22-11/05/22    Progress Note Due on Visit 50    PT Start Time 1015    PT Stop Time 1045    PT Time Calculation (min) 30 min    Equipment Utilized During Treatment Gait belt    Activity Tolerance Patient tolerated treatment well    Behavior During Therapy WFL for tasks assessed/performed                  Past Medical History:  Diagnosis Date   Cancer (HCC)    skin   Hypothyroidism    Raynaud's disease    Stroke [facility])    Past Surgical History:  Procedure Laterality Date   ABDOMINAL HYSTERECTOMY  1991   APPENDECTOMY  1991   BACK SURGERY  2010   Cervical fusion C4-5-6-7   CATARACT EXTRACTION, BILATERAL Bilateral 10/2016   CHOLECYSTECTOMY  1995   COLONOSCOPY WITH PROPOFOL N/A 12/08/2018   Procedure: COLONOSCOPY WITH PROPOFOL;  Surgeon: Toledo, Boykin Nearing, MD;  Location: [facility] ENDOSCOPY;  Service: Gastroenterology;  Laterality: N/A;   EYE SURGERY     Patient Active Problem List   Diagnosis Date Noted   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   Stroke (HCC) 02/27/2021   History of nonmelanoma skin cancer 05/23/2014    ONSET DATE: 04/16/2022  REFERRING DIAG: 69.354 (ICD-10-CM) - Hemiplegia and hemiparesis following cerebral infarction affecting left non-dominant side   THERAPY DIAG:  Unsteadiness on feet  Muscle weakness (generalized)  Difficulty in walking, not elsewhere classified  Other lack of coordination  Other abnormalities of gait and mobility  Rationale for Evaluation and Treatment:  Rehabilitation  SUBJECTIVE:                                                                                                                                                                                             SUBJECTIVE STATEMENT:  Patient reports doing okay- having some right hip pain today but overall doing well- going to continue working out at the [facility]  Pt accompanied by: Husband  PERTINENT HISTORY: Pt is a 82 y.o. female with referral for hemiplegia with left sided weakness -original CVA  on 02/26/21 and new onset of left sided weakness on 04/16/2022.  R MCA CVA on 02/26/21. Pt completed outpatient PT on 01/17/2022 with majority of goals met. PMH includes: skin cancer, hypothyroidism, and Raynaud's disease, history of cervical fusion in 2010 C4-C7.   PAIN:  Are you having pain?  no  PRECAUTIONS: Fall  WEIGHT BEARING RESTRICTIONS: No  FALLS: Has patient fallen in last 6 months? No   PATIENT GOALS: I want to walk without this cane and no falls and enjoy going out to grandkids sporting event  OBJECTIVE:    TODAY'S TREATMENT:                                                                                                                              DATE: 01/30/23     Therex:    VERBAL review of all gym exercises that the patient is going to perform at [facility] center.  Knee ext Knee ext Knee flex Leg press Calf press   NMR: Review of exercises to be performed at home  Static stand with narrowed feet - hold 60 sec x 2 Static stand with horizontal and vertical head turns x 10 reps each Tandem standing- hold 30 sec x 2 each LE Tandem gait in // bars - down and back x 4 Single leg stance- hold 5-10 sec x 10 sec      PATIENT EDUCATION: Education details: Pt educated throughout session about proper posture and technique with exercises. Improved exercise technique, movement at target joints, use of target muscles after min to mod verbal, visual,  tactile cues.  Person educated: Patient Education method: Explanation, Demonstration, Tactile cues, and Verbal cues Education comprehension: verbalized understanding, returned demonstration, verbal cues required, tactile cues required, and needs further education  HOME EXERCISE PROGRAM: Access Code: [email or web address] URL: [email or web address] Date: 10/17/2022 Prepared by: [name]  Exercises - Standing Marching  - 1 x daily - 7 x weekly - 1 sets - 20 reps - Side Stepping with Counter Support  - 1 x daily - 7 x weekly - 1 sets - 20 reps - Standing Tandem Balance with Counter Support  - 1 x daily - 7 x weekly - 2 sets - seconds  hold - Sit to Stand with Arms Crossed  - 1 x daily - 7 x weekly - 2 sets - 10 reps - Heel Raises with Counter Support  - 1 x daily - 7 x weekly - 2 sets - 20 reps  GOALS: Goals reviewed with patient? Yes  SHORT TERM GOALS: Target date: 07/04/2022  Patient will be independent in home exercise program to improve strength/mobility for better functional independence with ADLs  Baseline: EVAL: Patient reports mostly just walking; 07/09/22: Pt reports her exercises are going well, to be updated future date; 11/05/2022- Will now work with patient on HEP in gym setting Goal status: IN PROGRESS  LONG TERM GOALS: Target date: 01/28/2023   Pt will decrease 5TSTS by  at least 6 seconds in order to demonstrate clinically significant improvement in LE strength. Baseline: EVAL= 23.62 sec without UE; 07/09/22:  13 seconds hands-free  Goal status: MET  2. Pt will improve FOTO to target score of 66 to display perceived improvements in ability to complete ADL's.  Baseline: Eval= 60; 07/09/22: 65 5/21: 59% 11/05/2022= 72 Goal status:  MET  3.  Pt will decrease TUG to below 14 seconds/decrease in order to demonstrate decreased fall risk. Baseline: Eval= 20.14 sec without AD; 07/09/22: 11 seconds no AD Goal status: MET  4.  Patient will increase six minute walk test  distance to >1200 ft for improve gait ability and return to PLOF to complete her walks in her neighborhood.  Baseline: EVAL= 915 feet without an AD; 07/09/22: 1184 ft without AD 5/21: 1060 ft; 11/05/2022= 1175 feet without an AD Goal status: Partially MET  5. Pt will increase by at least 0.13 m/s in order to demonstrate clinically significant improvement in community ambulation.    Baseline: Eval = 0.70 m/s; 07/09/2022: 1.04 m/s without AD   Goal status: MET   6. Pt will report that she has not hit LLE/LUE on doorframe/walls/furniture while ambulating in the past two weeks to indicate improved ability to scan environment and to decrease injury risk with gait.  Baseline: currently has hit furniture/doorframe 2x/week 5/21: hitting doorframe less frequently. 08/23/2022- Patient reports no bumping arms into any walls or furniture since last tested on 08/13/2022. 09/27/2022= Patient reports no bumping arms into any walls or furniture since last tested on 08/23/2022.   Goal status: MET  7. Patient will increase Functional Gait Assessment score to >20/30 as to reduce fall risk and improve dynamic gait safety with community ambulation.  Baseline:5/21: 13/30; 09/27/2022= 19/30; 11/05/2022= 26/30   Goal status: MET  8. Patient will transition from skilled PT services to independent with gym based exercise program to continue with her gains made in PT and perform at an independent level for optimal safety and improved quality of life.   Baseline: 11/05/2022= Patient with no knowledge of Strengthening or balance exercises in gym based  Setting. 01/30/2023- Patient presents with good knowledge and training to continue her LE strengthening in gym based setting.   Goal status: MET  ASSESSMENT:  CLINICAL IMPRESSION: Patient arrived for her last scheduled visit for final review of HEP. She presented with much improved concept of how to incorporate both strengthening and balance activities. She lead the discussion  about the gym exercises and has decided to go  [employer] center. She was pleased with her progress and walking in clinic with no device. She has now met all her goals and appropriate for discharge with plan to continue in gym setting for LE strengthening and home for balance exercises.     OBJECTIVE IMPAIRMENTS: Abnormal gait, decreased activity tolerance, decreased balance, decreased coordination, decreased endurance, decreased mobility, difficulty walking, and decreased strength.   ACTIVITY LIMITATIONS: carrying, lifting, bending, standing, squatting, stairs, and transfers  PARTICIPATION LIMITATIONS: cleaning, laundry, driving, shopping, community activity, and yard work  PERSONAL FACTORS: 3+ comorbidities: OA, HTN, hx of previous CVA  are also affecting patient's functional outcome.   REHAB POTENTIAL: Good  CLINICAL DECISION MAKING: Evolving/moderate complexity  EVALUATION COMPLEXITY: Moderate  PLAN:  PT FREQUENCY: 1-2x/week  PT DURATION: 12 weeks  PLANNED INTERVENTIONS: Therapeutic exercises, Therapeutic activity, Neuromuscular re-education, Balance training, Gait training, Patient/Family education, Self Care, Joint mobilization, Joint manipulation, Stair training, Vestibular training, Canalith repositioning, DME instructions, Dry Needling,  Electrical stimulation, Spinal manipulation, Spinal mobilization, Cryotherapy, Moist heat, Taping, and Manual therapy  PLAN FOR NEXT SESSION:  Discharge patient today with goals met   Lenda Kelp PT   11:28 AM, 01/30/23 Louis Meckel, PT Physical Therapist - [facility]  [phone] [facility])   [facility]  01/30/2023, 11:28 AM   Physical Therapist - [facility]  Outpatient Physical Therapy- Main Campus [phone]    OUTPATIENT PHYSICAL THERAPY TREATMENT   Patient Name: Teresa [name] MRN: [id number] DOB:03-22-1941, 82  y.o., female Today's Date: 01/30/2023   PCP: Dr. Aram Beecham REFERRING PROVIDER: Dr. Aram Beecham  END OF SESSION:  PT End of Session - 01/30/23 1021     Visit Number 49    Number of Visits 49    Date for PT Re-Evaluation 01/30/23    Authorization Type [employer] PPO-    Authorization Time Period 08/13/22-11/05/22    Progress Note Due on Visit 50    PT Start Time 1015    PT Stop Time 1045    PT Time Calculation (min) 30 min    Equipment Utilized During Treatment Gait belt    Activity Tolerance Patient tolerated treatment well    Behavior During Therapy WFL for tasks assessed/performed                   Past Medical History:  Diagnosis Date   Cancer (HCC)    skin   Hypothyroidism    Raynaud's disease    Stroke [facility])    Past Surgical History:  Procedure Laterality Date   ABDOMINAL HYSTERECTOMY  1991   APPENDECTOMY  1991   BACK SURGERY  2010   Cervical fusion C4-5-6-7   CATARACT EXTRACTION, BILATERAL Bilateral 10/2016   CHOLECYSTECTOMY  1995   COLONOSCOPY WITH PROPOFOL N/A 12/08/2018   Procedure: COLONOSCOPY WITH PROPOFOL;  Surgeon: Toledo, Boykin Nearing, MD;  Location: [facility] ENDOSCOPY;  Service: Gastroenterology;  Laterality: N/A;   EYE SURGERY     Patient Active Problem List   Diagnosis Date Noted   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 Sherryll Burger. States may join the [employer] center for gym. States she lost her balance cleaning her closet and hit her back into corner of wall. Some bruising to Right hip region and some right back pain- 3-4/10.  Pt accompanied by: Husband  PERTINENT HISTORY: Pt is a 82 y.o. female with referral for hemiplegia with left sided weakness -original CVA on 02/26/21 and new onset of left sided weakness on 04/16/2022.  R MCA CVA on 02/26/21. Pt completed outpatient PT on 01/17/2022 with majority of goals met. PMH includes: skin cancer, hypothyroidism, and Raynaud's disease, history of cervical fusion in 2010 C4-C7.   PAIN:  Are you having pain?  Yes- right side back- 3-4/10  PRECAUTIONS: Fall  WEIGHT BEARING RESTRICTIONS: No  FALLS: Has patient fallen in last 6 months? No   PATIENT GOALS: I want to walk without this cane and no falls and enjoy going out to grandkids sporting event  OBJECTIVE:    TODAY'S TREATMENT:                                                                                                                              DATE: 01/30/23   Therex:    Lumbar stretching:  Supine Knee to chest - 30sec hold x 4 each LE Lower trunk rotation- 30 sec hold x 4 each LE Hamstring stretch - 30 sec hold x 4 each   Ambulation with spc - 1600 feet with some report of LBP- 3/10. She did demonstrate good balance no unsteadiness.   Seated lumbar flex in chair - hold 30 sec x 4 Seated figure 4 stretch - hold 30 sec x 4 RLE Seated hamstring stretch- Hold 30 sec x 3  Ambulation with SPC - approx 160 feet _ patient reports pain is there but not any worse.           PATIENT EDUCATION: Education details: Pt educated throughout session about proper posture and technique with exercises. Improved exercise technique, movement at target joints, use  of target muscles after min to mod verbal, visual, tactile cues.  Person educated: Patient Education method: Explanation, Demonstration, Tactile cues, and Verbal cues Education comprehension: verbalized understanding, returned demonstration, verbal cues required, tactile cues required, and needs further education  HOME EXERCISE PROGRAM: Access Code: [email or web address] URL: [email or web address] Date: 10/17/2022 Prepared by: [name]  Exercises - Standing Marching  - 1 x daily - 7 x weekly - 1 sets - 20 reps - Side Stepping with Counter Support  - 1 x daily - 7 x weekly - 1 sets - 20 reps - Standing Tandem Balance with Counter Support  - 1 x daily - 7 x weekly - 2 sets - seconds  hold - Sit to Stand with Arms Crossed  - 1 x daily - 7 x weekly - 2 sets - 10 reps - Heel Raises with Counter Support  - 1 x daily -  7 x weekly - 2 sets - 20 reps  GOALS: Goals reviewed with patient? Yes  SHORT TERM GOALS: Target date: 07/04/2022  Patient will be independent in home exercise program to improve strength/mobility for better functional independence with ADLs  Baseline: EVAL: Patient reports mostly just walking; 07/09/22: Pt reports her exercises are going well, to be updated future date; 11/05/2022- Will now work with patient on HEP in gym setting Goal status: IN PROGRESS  LONG TERM GOALS: Target date: 01/30/2023   Pt will decrease 5TSTS by at least 6 seconds in order to demonstrate clinically significant improvement in LE strength. Baseline: EVAL= 23.62 sec without UE; 07/09/22:  13 seconds hands-free  Goal status: MET  2. Pt will improve FOTO to target score of 66 to display perceived improvements in ability to complete ADL's.  Baseline: Eval= 60; 07/09/22: 65 5/21: 59% 11/05/2022= 72 Goal status:  MET  3.  Pt will decrease TUG to below 14 seconds/decrease in order to demonstrate decreased fall risk. Baseline: Eval= 20.14 sec without AD; 07/09/22: 11 seconds no AD Goal status:  MET  4.  Patient will increase six minute walk test distance to >1200 ft for improve gait ability and return to PLOF to complete her walks in her neighborhood.  Baseline: EVAL= 915 feet without an AD; 07/09/22: 1184 ft without AD 5/21: 1060 ft; 11/05/2022= 1175 feet without an AD Goal status: Partially MET  5. Pt will increase by at least 0.13 m/s in order to demonstrate clinically significant improvement in community ambulation.    Baseline: Eval = 0.70 m/s; 07/09/2022: 1.04 m/s without AD   Goal status: MET   6. Pt will report that she has not hit LLE/LUE on doorframe/walls/furniture while ambulating in the past two weeks to indicate improved ability to scan environment and to decrease injury risk with gait.  Baseline: currently has hit furniture/doorframe 2x/week 5/21: hitting doorframe less frequently. 08/23/2022- Patient reports no bumping arms into any walls or furniture since last tested on 08/13/2022. 09/27/2022= Patient reports no bumping arms into any walls or furniture since last tested on 08/23/2022.   Goal status: MET  7. Patient will increase Functional Gait Assessment score to >20/30 as to reduce fall risk and improve dynamic gait safety with community ambulation.  Baseline:5/21: 13/30; 09/27/2022= 19/30; 11/05/2022= 26/30   Goal status: MET  8. Patient will transition from skilled PT services to independent with gym based exercise program to continue with her gains made in PT and perform at an independent level for optimal safety and improved quality of life.   Baseline: 11/05/2022= Patient with no knowledge of Strengthening or balance exercises in gym based  Setting.   Goal status: INITIAL  ASSESSMENT:  CLINICAL IMPRESSION: Treatment limited secondary to patient with some acute low back pain. Instructed her in some gentle stretching today and she responded well. Informed her that since pain is > 1 week s/p injury that she really should go get check out by medical to ensure no  issues. She stated she would go. She is scheduled for 1 more visit with plan to discharge to home program next visit. Patient in agreement with plan.          OBJECTIVE IMPAIRMENTS: Abnormal gait, decreased activity tolerance, decreased balance, decreased coordination, decreased endurance, decreased mobility, difficulty walking, and decreased strength.   ACTIVITY LIMITATIONS: carrying, lifting, bending, standing, squatting, stairs, and transfers  PARTICIPATION LIMITATIONS: cleaning, laundry, driving, shopping, community activity, and yard work  PERSONAL FACTORS: 3+ comorbidities: OA,  HTN, hx of previous CVA  are also affecting patient's functional outcome.   REHAB POTENTIAL: Good  CLINICAL DECISION MAKING: Evolving/moderate complexity  EVALUATION COMPLEXITY: Moderate  PLAN:  PT FREQUENCY: 1 visit  PT DURATION: 1 visit  PLANNED INTERVENTIONS: Therapeutic exercises, Therapeutic activity, Neuromuscular re-education, Balance training, Gait training, Patient/Family education, Self Care, Joint mobilization, Joint manipulation, Stair training, Vestibular training, Canalith repositioning, DME instructions, Dry Needling, Electrical stimulation, Spinal manipulation, Spinal mobilization, Cryotherapy, Moist heat, Taping, and Manual therapy  PLAN FOR NEXT SESSION:  Discharge patient next visit after final HEP review.    Lenda Kelp PT   11:28 AM, 01/30/23 Louis Meckel, PT Physical Therapist - [facility]  [phone] [facility])   [facility]  01/30/2023, 11:28 AM   Physical Therapist - [facility]  Outpatient Physical Therapy- Main Campus [phone]

## 2023-02-04 ENCOUNTER — Ambulatory Visit: Payer: Medicare HMO

## 2023-02-06 ENCOUNTER — Ambulatory Visit: Payer: Medicare HMO

## 2023-02-10 NOTE — Progress Notes (Signed)
Carelink Summary Report / Loop Recorder 

## 2023-02-11 ENCOUNTER — Ambulatory Visit: Payer: Medicare HMO

## 2023-02-13 ENCOUNTER — Ambulatory Visit: Payer: Medicare HMO

## 2023-02-14 DIAGNOSIS — M25551 Pain in right hip: Secondary | ICD-10-CM | POA: Diagnosis not present

## 2023-02-14 DIAGNOSIS — I69354 Hemiplegia and hemiparesis following cerebral infarction affecting left non-dominant side: Secondary | ICD-10-CM | POA: Diagnosis not present

## 2023-02-14 DIAGNOSIS — E782 Mixed hyperlipidemia: Secondary | ICD-10-CM | POA: Diagnosis not present

## 2023-02-14 DIAGNOSIS — I129 Hypertensive chronic kidney disease with stage 1 through stage 4 chronic kidney disease, or unspecified chronic kidney disease: Secondary | ICD-10-CM | POA: Diagnosis not present

## 2023-02-14 DIAGNOSIS — M1611 Unilateral primary osteoarthritis, right hip: Secondary | ICD-10-CM | POA: Diagnosis not present

## 2023-02-14 DIAGNOSIS — N1832 Chronic kidney disease, stage 3b: Secondary | ICD-10-CM | POA: Diagnosis not present

## 2023-02-14 DIAGNOSIS — E039 Hypothyroidism, unspecified: Secondary | ICD-10-CM | POA: Diagnosis not present

## 2023-02-14 DIAGNOSIS — Z79899 Other long term (current) drug therapy: Secondary | ICD-10-CM | POA: Diagnosis not present

## 2023-02-14 DIAGNOSIS — Z87891 Personal history of nicotine dependence: Secondary | ICD-10-CM | POA: Diagnosis not present

## 2023-02-14 DIAGNOSIS — M25559 Pain in unspecified hip: Secondary | ICD-10-CM | POA: Diagnosis not present

## 2023-02-18 ENCOUNTER — Ambulatory Visit: Payer: Medicare HMO

## 2023-02-23 LAB — CUP PACEART REMOTE DEVICE CHECK
Date Time Interrogation Session: 20241129230541
Implantable Pulse Generator Implant Date: 19440924

## 2023-02-24 ENCOUNTER — Ambulatory Visit (INDEPENDENT_AMBULATORY_CARE_PROVIDER_SITE_OTHER): Payer: Medicare HMO

## 2023-02-24 DIAGNOSIS — I639 Cerebral infarction, unspecified: Secondary | ICD-10-CM

## 2023-02-25 ENCOUNTER — Ambulatory Visit: Payer: Medicare HMO

## 2023-02-27 ENCOUNTER — Ambulatory Visit: Payer: Medicare HMO

## 2023-03-04 ENCOUNTER — Ambulatory Visit: Payer: Medicare HMO

## 2023-03-06 ENCOUNTER — Ambulatory Visit: Payer: Medicare HMO

## 2023-03-31 ENCOUNTER — Ambulatory Visit (INDEPENDENT_AMBULATORY_CARE_PROVIDER_SITE_OTHER): Payer: Medicare PPO

## 2023-03-31 DIAGNOSIS — I639 Cerebral infarction, unspecified: Secondary | ICD-10-CM

## 2023-03-31 DIAGNOSIS — H4922 Sixth [abducent] nerve palsy, left eye: Secondary | ICD-10-CM | POA: Diagnosis not present

## 2023-03-31 DIAGNOSIS — H532 Diplopia: Secondary | ICD-10-CM | POA: Diagnosis not present

## 2023-04-01 LAB — CUP PACEART REMOTE DEVICE CHECK
Date Time Interrogation Session: 20250105231216
Implantable Pulse Generator Implant Date: 19440924

## 2023-04-23 ENCOUNTER — Encounter: Payer: Self-pay | Admitting: Internal Medicine

## 2023-05-05 ENCOUNTER — Ambulatory Visit (INDEPENDENT_AMBULATORY_CARE_PROVIDER_SITE_OTHER): Payer: Medicare PPO

## 2023-05-05 DIAGNOSIS — I639 Cerebral infarction, unspecified: Secondary | ICD-10-CM

## 2023-05-05 LAB — CUP PACEART REMOTE DEVICE CHECK
Date Time Interrogation Session: 20250209231416
Implantable Pulse Generator Implant Date: 19440924

## 2023-05-12 NOTE — Progress Notes (Signed)
 [facility] Summary Report / Loop Recorder

## 2023-05-12 NOTE — Addendum Note (Signed)
Addended by: Geralyn Flash D on: 05/12/2023 11:18 AM   Modules accepted: Orders

## 2023-05-21 DIAGNOSIS — G629 Polyneuropathy, unspecified: Secondary | ICD-10-CM | POA: Diagnosis not present

## 2023-05-21 DIAGNOSIS — N1832 Chronic kidney disease, stage 3b: Secondary | ICD-10-CM | POA: Diagnosis not present

## 2023-05-21 DIAGNOSIS — I69354 Hemiplegia and hemiparesis following cerebral infarction affecting left non-dominant side: Secondary | ICD-10-CM | POA: Diagnosis not present

## 2023-05-21 DIAGNOSIS — I10 Essential (primary) hypertension: Secondary | ICD-10-CM | POA: Diagnosis not present

## 2023-05-21 DIAGNOSIS — E039 Hypothyroidism, unspecified: Secondary | ICD-10-CM | POA: Diagnosis not present

## 2023-05-21 DIAGNOSIS — E663 Overweight: Secondary | ICD-10-CM | POA: Diagnosis not present

## 2023-05-21 DIAGNOSIS — I7300 Raynaud's syndrome without gangrene: Secondary | ICD-10-CM | POA: Diagnosis not present

## 2023-05-21 DIAGNOSIS — I129 Hypertensive chronic kidney disease with stage 1 through stage 4 chronic kidney disease, or unspecified chronic kidney disease: Secondary | ICD-10-CM | POA: Diagnosis not present

## 2023-05-21 DIAGNOSIS — G63 Polyneuropathy in diseases classified elsewhere: Secondary | ICD-10-CM | POA: Diagnosis not present

## 2023-05-21 DIAGNOSIS — E038 Other specified hypothyroidism: Secondary | ICD-10-CM | POA: Diagnosis not present

## 2023-05-21 DIAGNOSIS — E785 Hyperlipidemia, unspecified: Secondary | ICD-10-CM | POA: Diagnosis not present

## 2023-05-21 DIAGNOSIS — G8929 Other chronic pain: Secondary | ICD-10-CM | POA: Diagnosis not present

## 2023-05-26 ENCOUNTER — Encounter: Payer: Self-pay | Admitting: Internal Medicine

## 2023-06-05 DIAGNOSIS — R569 Unspecified convulsions: Secondary | ICD-10-CM | POA: Diagnosis not present

## 2023-06-05 DIAGNOSIS — G8194 Hemiplegia, unspecified affecting left nondominant side: Secondary | ICD-10-CM | POA: Diagnosis not present

## 2023-06-05 DIAGNOSIS — R531 Weakness: Secondary | ICD-10-CM | POA: Diagnosis not present

## 2023-06-05 DIAGNOSIS — G4733 Obstructive sleep apnea (adult) (pediatric): Secondary | ICD-10-CM | POA: Diagnosis not present

## 2023-06-09 ENCOUNTER — Ambulatory Visit (INDEPENDENT_AMBULATORY_CARE_PROVIDER_SITE_OTHER): Payer: Medicare PPO

## 2023-06-09 DIAGNOSIS — I639 Cerebral infarction, unspecified: Secondary | ICD-10-CM | POA: Diagnosis not present

## 2023-06-10 ENCOUNTER — Ambulatory Visit: Payer: Medicare PPO | Admitting: Dermatology

## 2023-06-10 ENCOUNTER — Encounter: Payer: Self-pay | Admitting: Dermatology

## 2023-06-10 DIAGNOSIS — W908XXA Exposure to other nonionizing radiation, initial encounter: Secondary | ICD-10-CM

## 2023-06-10 DIAGNOSIS — D692 Other nonthrombocytopenic purpura: Secondary | ICD-10-CM | POA: Diagnosis not present

## 2023-06-10 DIAGNOSIS — Z85828 Personal history of other malignant neoplasm of skin: Secondary | ICD-10-CM

## 2023-06-10 DIAGNOSIS — L821 Other seborrheic keratosis: Secondary | ICD-10-CM | POA: Diagnosis not present

## 2023-06-10 DIAGNOSIS — D229 Melanocytic nevi, unspecified: Secondary | ICD-10-CM

## 2023-06-10 DIAGNOSIS — L814 Other melanin hyperpigmentation: Secondary | ICD-10-CM | POA: Diagnosis not present

## 2023-06-10 DIAGNOSIS — L738 Other specified follicular disorders: Secondary | ICD-10-CM | POA: Diagnosis not present

## 2023-06-10 DIAGNOSIS — D1801 Hemangioma of skin and subcutaneous tissue: Secondary | ICD-10-CM

## 2023-06-10 DIAGNOSIS — L820 Inflamed seborrheic keratosis: Secondary | ICD-10-CM

## 2023-06-10 DIAGNOSIS — Z1283 Encounter for screening for malignant neoplasm of skin: Secondary | ICD-10-CM

## 2023-06-10 DIAGNOSIS — L578 Other skin changes due to chronic exposure to nonionizing radiation: Secondary | ICD-10-CM | POA: Diagnosis not present

## 2023-06-10 LAB — CUP PACEART REMOTE DEVICE CHECK
Date Time Interrogation Session: 20250316231118
Implantable Pulse Generator Implant Date: 19440924

## 2023-06-10 NOTE — Patient Instructions (Addendum)
 Cryotherapy Aftercare  Wash gently with soap and water everyday.   Apply Vaseline Jelly daily until healed.    Recommend daily broad spectrum sunscreen SPF 30+ to sun-exposed areas, reapply every 2 hours as needed. Call for new or changing lesions.  Staying in the shade or wearing long sleeves, sun glasses (UVA+UVB protection) and wide brim hats (4-inch brim around the entire circumference of the hat) are also recommended for sun protection.    Melanoma ABCDEs  Melanoma is the most dangerous type of skin cancer, and is the leading cause of death from skin disease.  You are more likely to develop melanoma if you: Have light-colored skin, light-colored eyes, or red or blond hair Spend a lot of time in the sun Tan regularly, either outdoors or in a tanning bed Have had blistering sunburns, especially during childhood Have a close family member who has had a melanoma Have atypical moles or large birthmarks  Early detection of melanoma is key since treatment is typically straightforward and cure rates are extremely high if we catch it early.   The first sign of melanoma is often a change in a mole or a new dark spot.  The ABCDE system is a way of remembering the signs of melanoma.  A for asymmetry:  The two halves do not match. B for border:  The edges of the growth are irregular. C for color:  A mixture of colors are present instead of an even brown color. D for diameter:  Melanomas are usually (but not always) greater than 6mm - the size of a pencil eraser. E for evolution:  The spot keeps changing in size, shape, and color.  Please check your skin once per month between visits. You can use a small mirror in front and a large mirror behind you to keep an eye on the back side or your body.   If you see any new or changing lesions before your next follow-up, please call to schedule a visit.  Please continue daily skin protection including broad spectrum sunscreen SPF 30+ to sun-exposed  areas, reapplying every 2 hours as needed when you're outdoors.   Staying in the shade or wearing long sleeves, sun glasses (UVA+UVB protection) and wide brim hats (4-inch brim around the entire circumference of the hat) are also recommended for sun protection.     Due to recent changes in healthcare laws, you may see results of your pathology and/or laboratory studies on MyChart before the doctors have had a chance to review them. We understand that in some cases there may be results that are confusing or concerning to you. Please understand that not all results are received at the same time and often the doctors may need to interpret multiple results in order to provide you with the best plan of care or course of treatment. Therefore, we ask that you please give [place] 2 business days to thoroughly review all your results before contacting the office for clarification. Should we see a critical lab result, you will be contacted sooner.   If You Need Anything After Your Visit  If you have any questions or concerns for your doctor, please call our main line at [phone] and press option 4 to reach your doctor's medical assistant. If no one answers, please leave a voicemail as directed and we will return your call as soon as possible. Messages left after 4 pm will be answered the following business day.   You may also send [place] a message via  MyChart. We typically respond to MyChart messages within 1-2 business days.  For prescription refills, please ask your pharmacy to contact our office. Our fax number is [phone].  If you have an urgent issue when the clinic is closed that cannot wait until the next business day, you can page your doctor at the number below.    Please note that while we do our best to be available for urgent issues outside of office hours, we are not available 24/7.   If you have an urgent issue and are unable to reach [place], you may choose to seek medical care at your doctor's  office, retail clinic, urgent care center, or emergency room.  If you have a medical emergency, please immediately call 911 or go to the emergency department.  Pager Numbers  - Dr. Gwen Pounds: [phone]  - Dr. Roseanne Reno: [phone]  - Dr. Katrinka Blazing: [phone]   In the event of inclement weather, please call our main line at [phone] for an update on the status of any delays or closures.  Dermatology Medication Tips: Please keep the boxes that topical medications come in in order to help keep track of the instructions about where and how to use these. Pharmacies typically print the medication instructions only on the boxes and not directly on the medication tubes.   If your medication is too expensive, please contact our office at [phone] option 4 or send [place] a message through MyChart.   We are unable to tell what your co-pay for medications will be in advance as this is different depending on your insurance coverage. However, we may be able to find a substitute medication at lower cost or fill out paperwork to get insurance to cover a needed medication.   If a prior authorization is required to get your medication covered by your insurance company, please allow [place] 1-2 business days to complete this process.  Drug prices often vary depending on where the prescription is filled and some pharmacies may offer cheaper prices.  The website www.goodrx.com contains coupons for medications through different pharmacies. The prices here do not account for what the cost may be with help from insurance (it may be cheaper with your insurance), but the website can give you the price if you did not use any insurance.  - You can print the associated coupon and take it with your prescription to the pharmacy.  - You may also stop by our office during regular business hours and pick up a GoodRx coupon card.  - If you need your prescription sent electronically to a different pharmacy, notify our  office through [facility] or by phone at [phone] option 4.     Si Usted Necesita Algo Despus de Su Visita  Tambin puede enviarnos un mensaje a travs de Clinical cytogeneticist. Por lo general respondemos a los mensajes de MyChart en el transcurso de 1 a 2 das hbiles.  Para renovar recetas, por favor pida a su farmacia que se ponga en contacto con nuestra oficina. Annie Sable de fax es [place] [phone].  Si tiene un asunto urgente cuando la clnica est cerrada y que no puede esperar hasta el siguiente da hbil, puede llamar/localizar a su doctor(a) al nmero que aparece a continuacin.   Por favor, tenga en cuenta que aunque hacemos todo lo posible para estar disponibles para asuntos urgentes fuera del horario de [place], no estamos disponibles las 24 horas del da, los 7 [address]  [address] de la [place].   Si tiene un problema urgente y  no puede comunicarse con nosotros, puede optar por buscar atencin mdica  en el consultorio de su doctor(a), en una clnica privada, en un centro de atencin urgente o en una sala de emergencias.  Si tiene Engineer, drilling, por favor llame inmediatamente al 911 o vaya a la sala de emergencias.  Nmeros de bper  - Dr. Gwen Pounds: [phone]  - Dra. Roseanne Reno: [phone]  - Dr. Katrinka Blazing: [phone]   En caso de inclemencias del tiempo, por favor llame a Lacy Duverney principal al [phone] para una actualizacin sobre el [place] de cualquier retraso o cierre.  Consejos para la medicacin en dermatologa: Por favor, guarde las cajas en las que vienen los medicamentos de uso tpico para ayudarle a seguir las instrucciones sobre dnde y cmo usarlos. Las farmacias generalmente imprimen las instrucciones del medicamento slo en las cajas y no directamente en los tubos del [place].   Si su medicamento es muy caro, por favor, pngase en contacto con Rolm Gala llamando al [phone] y presione la opcin 4 o envenos un mensaje a travs de Clinical cytogeneticist.    No podemos decirle cul ser su copago por los medicamentos por adelantado ya que esto es diferente dependiendo de la cobertura de su seguro. Sin embargo, es posible que podamos encontrar un medicamento sustituto a Audiological scientist un formulario para que el seguro cubra el medicamento que se considera necesario.   Si se requiere una autorizacin previa para que su compaa de seguros [place] su medicamento, por favor permtanos de 1 a 2 das hbiles para completar [address]  Los precios de los medicamentos varan con frecuencia dependiendo del Environmental consultant de dnde se surte la receta y alguna farmacias pueden ofrecer precios ms baratos.  El sitio web www.goodrx.com tiene cupones para medicamentos de Health and safety inspector. Los precios aqu no tienen en cuenta lo que podra costar con la ayuda del seguro (puede ser ms barato con su seguro), pero el sitio web puede darle el precio si no utiliz Tourist information centre manager.  - Puede imprimir el cupn correspondiente y llevarlo con su receta a la farmacia.  - Tambin puede pasar por nuestra oficina durante el horario de atencin regular y Education officer, museum una tarjeta de cupones de GoodRx.  - Si necesita que su receta se enve electrnicamente a una farmacia diferente, informe a nuestra oficina a travs de MyChart de [place] o por telfono llamando al [phone] y presione la opcin 4.

## 2023-06-10 NOTE — Progress Notes (Signed)
 Follow-Up Visit   Subjective  Teresa [name] is a 83 y.o. female who presents for the following: Skin Cancer Screening and Full Body Skin Exam. Hx of skin cancer. C/O rough bumpy areas at left neck and left temple. Itching area at left occipital scalp.   The patient presents for Total-Body Skin Exam (TBSE) for skin cancer screening and mole check. The patient has spots, moles and lesions to be evaluated, some may be new or changing and the patient may have concern these could be cancer.    The following portions of the chart were reviewed this encounter and updated as appropriate: medications, allergies, medical history  Review of Systems:  No other skin or systemic complaints except as noted in HPI or Assessment and Plan.  Objective  Well appearing patient in no apparent distress; mood and affect are within normal limits.  A full examination was performed including scalp, head, eyes, ears, nose, lips, neck, chest, axillae, abdomen, back, buttocks, bilateral upper extremities, bilateral lower extremities, hands, feet, fingers, toes, fingernails, and toenails. All findings within normal limits unless otherwise noted below.   Relevant physical exam findings are noted in the Assessment and Plan.  Exam of nails limited by presence of nail polish.   Left Occipital Hair Line x1, L upper chest x2 (3) Erythematous keratotic or waxy stuck-on papule    Assessment & Plan   SKIN CANCER SCREENING PERFORMED TODAY.  HISTORY OF SKIN CANCER. Right forearm. Tx ~30 years ago.  - Clear. Observe for recurrence.  - Call clinic for new or changing lesions.   - Recommend regular skin exams, daily broad-spectrum spf 30+ sunscreen use, and photoprotection.      ACTINIC DAMAGE - Chronic condition, secondary to cumulative UV/sun exposure - diffuse scaly erythematous macules with underlying dyspigmentation - Recommend daily broad spectrum sunscreen SPF 30+ to sun-exposed areas, reapply every 2 hours as  needed.  - Staying in the shade or wearing long sleeves, sun glasses (UVA+UVB protection) and wide brim hats (4-inch brim around the entire circumference of the hat) are also recommended for sun protection.  - Call for new or changing lesions.  LENTIGINES, SEBORRHEIC KERATOSES, HEMANGIOMAS - Benign normal skin lesions - Benign-appearing - Call for any changes  MELANOCYTIC NEVI - Tan-brown and/or pink-flesh-colored symmetric macules and papules - Benign appearing on exam today - Observation - Call clinic for new or changing moles - Recommend daily use of broad spectrum spf 30+ sunscreen to sun-exposed areas.  - Check nails when remove polish.   Purpura - Chronic; persistent and recurrent.  Treatable, but not curable. - Violaceous macules and patches at arms, left mid eyebrow, posterior legs. - Benign - Related to trauma, age, sun damage and/or use of blood thinners, chronic use of topical and/or oral steroids - Observe - Can use OTC arnica containing moisturizer such as Dermend Bruise Formula if desired - Call for worsening or other concerns  Sebaceous Hyperplasia - Small yellow papules with a central dell at left lateral canthus - Benign-appearing - Observe. Call for changes.   SEBORRHEIC KERATOSIS - Stuck-on, waxy, tan-brown papules at left upper eyelid canthus and left chest - Benign-appearing - Discussed benign etiology and prognosis. - Observe - Call for any changes  INFLAMED SEBORRHEIC KERATOSIS (3) Left Occipital Hair Line x1, L upper chest x2 (3) Symptomatic, irritating, patient would like treated. Destruction of lesion - Left Occipital Hair Line x1, L upper chest x2 (3)  Destruction method: cryotherapy   Informed consent: discussed and consent obtained  Lesion destroyed using liquid nitrogen: Yes   Region frozen until ice ball extended beyond lesion: Yes   Outcome: patient tolerated procedure well with no complications   Post-procedure details: wound care  instructions given   Additional details:  Prior to procedure, discussed risks of blister formation, small wound, skin dyspigmentation, or rare scar following cryotherapy. Recommend Vaseline ointment to treated areas while healing.   Return in about 1 year (around 06/09/2024) for TBSE, Hx of skin cancer.  I, Lawson Radar, CMA, am acting as scribe for Willeen Niece, MD.   Documentation: I have reviewed the above documentation for accuracy and completeness, and I agree with the above.  Willeen Niece, MD

## 2023-06-12 NOTE — Progress Notes (Signed)
 [facility] Summary Report / Loop Recorder

## 2023-06-16 ENCOUNTER — Other Ambulatory Visit: Payer: Self-pay | Admitting: Internal Medicine

## 2023-06-16 DIAGNOSIS — Z1231 Encounter for screening mammogram for malignant neoplasm of breast: Secondary | ICD-10-CM

## 2023-06-24 DIAGNOSIS — Z0000 Encounter for general adult medical examination without abnormal findings: Secondary | ICD-10-CM | POA: Diagnosis not present

## 2023-06-24 DIAGNOSIS — Z1231 Encounter for screening mammogram for malignant neoplasm of breast: Secondary | ICD-10-CM | POA: Diagnosis not present

## 2023-06-24 DIAGNOSIS — I10 Essential (primary) hypertension: Secondary | ICD-10-CM | POA: Diagnosis not present

## 2023-06-24 DIAGNOSIS — R7309 Other abnormal glucose: Secondary | ICD-10-CM | POA: Diagnosis not present

## 2023-06-24 DIAGNOSIS — E039 Hypothyroidism, unspecified: Secondary | ICD-10-CM | POA: Diagnosis not present

## 2023-06-24 DIAGNOSIS — N1832 Chronic kidney disease, stage 3b: Secondary | ICD-10-CM | POA: Diagnosis not present

## 2023-06-24 DIAGNOSIS — E782 Mixed hyperlipidemia: Secondary | ICD-10-CM | POA: Diagnosis not present

## 2023-06-24 DIAGNOSIS — Z79899 Other long term (current) drug therapy: Secondary | ICD-10-CM | POA: Diagnosis not present

## 2023-06-24 DIAGNOSIS — I69354 Hemiplegia and hemiparesis following cerebral infarction affecting left non-dominant side: Secondary | ICD-10-CM | POA: Diagnosis not present

## 2023-06-25 DIAGNOSIS — E039 Hypothyroidism, unspecified: Secondary | ICD-10-CM | POA: Diagnosis not present

## 2023-06-25 DIAGNOSIS — E782 Mixed hyperlipidemia: Secondary | ICD-10-CM | POA: Diagnosis not present

## 2023-06-25 DIAGNOSIS — I10 Essential (primary) hypertension: Secondary | ICD-10-CM | POA: Diagnosis not present

## 2023-06-25 DIAGNOSIS — Z79899 Other long term (current) drug therapy: Secondary | ICD-10-CM | POA: Diagnosis not present

## 2023-06-25 DIAGNOSIS — R7309 Other abnormal glucose: Secondary | ICD-10-CM | POA: Diagnosis not present

## 2023-06-26 DIAGNOSIS — E782 Mixed hyperlipidemia: Secondary | ICD-10-CM | POA: Diagnosis not present

## 2023-06-26 DIAGNOSIS — E039 Hypothyroidism, unspecified: Secondary | ICD-10-CM | POA: Diagnosis not present

## 2023-06-26 DIAGNOSIS — Z79899 Other long term (current) drug therapy: Secondary | ICD-10-CM | POA: Diagnosis not present

## 2023-06-26 DIAGNOSIS — R7309 Other abnormal glucose: Secondary | ICD-10-CM | POA: Diagnosis not present

## 2023-06-26 DIAGNOSIS — I10 Essential (primary) hypertension: Secondary | ICD-10-CM | POA: Diagnosis not present

## 2023-07-07 ENCOUNTER — Encounter: Payer: Self-pay | Admitting: Internal Medicine

## 2023-07-14 ENCOUNTER — Ambulatory Visit (INDEPENDENT_AMBULATORY_CARE_PROVIDER_SITE_OTHER): Payer: Medicare PPO

## 2023-07-14 DIAGNOSIS — I639 Cerebral infarction, unspecified: Secondary | ICD-10-CM

## 2023-07-15 LAB — CUP PACEART REMOTE DEVICE CHECK
Date Time Interrogation Session: 20250420231154
Implantable Pulse Generator Implant Date: 19440924

## 2023-07-27 ENCOUNTER — Encounter: Payer: Self-pay | Admitting: Cardiology

## 2023-07-28 NOTE — Progress Notes (Signed)
 [facility] Summary Report / Loop Recorder

## 2023-07-28 NOTE — Addendum Note (Signed)
 Addended by: Edra Govern D on: 07/28/2023 02:33 PM   Modules accepted: Orders

## 2023-07-30 ENCOUNTER — Ambulatory Visit
Admission: RE | Admit: 2023-07-30 | Discharge: 2023-07-30 | Disposition: A | Discharge status: Home / Self Care | Source: Ambulatory Visit | Attending: Internal Medicine | Admitting: Internal Medicine

## 2023-07-30 DIAGNOSIS — Z1231 Encounter for screening mammogram for malignant neoplasm of breast: Secondary | ICD-10-CM | POA: Diagnosis not present

## 2023-08-13 DIAGNOSIS — U071 COVID-19: Secondary | ICD-10-CM | POA: Diagnosis not present

## 2023-08-13 DIAGNOSIS — Z8673 Personal history of transient ischemic attack (TIA), and cerebral infarction without residual deficits: Secondary | ICD-10-CM | POA: Diagnosis not present

## 2023-08-13 DIAGNOSIS — J208 Acute bronchitis due to other specified organisms: Secondary | ICD-10-CM | POA: Diagnosis not present

## 2023-08-14 ENCOUNTER — Ambulatory Visit (INDEPENDENT_AMBULATORY_CARE_PROVIDER_SITE_OTHER)

## 2023-08-14 DIAGNOSIS — I639 Cerebral infarction, unspecified: Secondary | ICD-10-CM | POA: Diagnosis not present

## 2023-08-14 LAB — CUP PACEART REMOTE DEVICE CHECK
Date Time Interrogation Session: 20250521231256
Implantable Pulse Generator Implant Date: 19440924

## 2023-08-20 ENCOUNTER — Ambulatory Visit: Payer: Self-pay | Admitting: Cardiovascular Disease

## 2023-09-03 NOTE — Addendum Note (Signed)
 Addended by: Edra Govern D on: 09/03/2023 09:42 AM   Modules accepted: Orders

## 2023-09-03 NOTE — Progress Notes (Signed)
 [facility] Summary Report / Loop Recorder

## 2023-09-08 DIAGNOSIS — R5383 Other fatigue: Secondary | ICD-10-CM | POA: Diagnosis not present

## 2023-09-08 DIAGNOSIS — R4189 Other symptoms and signs involving cognitive functions and awareness: Secondary | ICD-10-CM | POA: Diagnosis not present

## 2023-09-08 DIAGNOSIS — F32A Depression, unspecified: Secondary | ICD-10-CM | POA: Diagnosis not present

## 2023-09-08 DIAGNOSIS — Z8673 Personal history of transient ischemic attack (TIA), and cerebral infarction without residual deficits: Secondary | ICD-10-CM | POA: Diagnosis not present

## 2023-09-08 DIAGNOSIS — G8194 Hemiplegia, unspecified affecting left nondominant side: Secondary | ICD-10-CM | POA: Diagnosis not present

## 2023-09-13 DIAGNOSIS — G4733 Obstructive sleep apnea (adult) (pediatric): Secondary | ICD-10-CM | POA: Diagnosis not present

## 2023-09-15 ENCOUNTER — Ambulatory Visit (INDEPENDENT_AMBULATORY_CARE_PROVIDER_SITE_OTHER)

## 2023-09-15 DIAGNOSIS — I639 Cerebral infarction, unspecified: Secondary | ICD-10-CM | POA: Diagnosis not present

## 2023-09-15 LAB — CUP PACEART REMOTE DEVICE CHECK
Date Time Interrogation Session: 20250622232757
Implantable Pulse Generator Implant Date: 19440924

## 2023-09-22 ENCOUNTER — Ambulatory Visit: Payer: Medicare PPO

## 2023-09-24 DIAGNOSIS — Z79899 Other long term (current) drug therapy: Secondary | ICD-10-CM | POA: Diagnosis not present

## 2023-09-24 DIAGNOSIS — I739 Peripheral vascular disease, unspecified: Secondary | ICD-10-CM | POA: Diagnosis not present

## 2023-09-24 DIAGNOSIS — N1832 Chronic kidney disease, stage 3b: Secondary | ICD-10-CM | POA: Diagnosis not present

## 2023-09-24 DIAGNOSIS — E039 Hypothyroidism, unspecified: Secondary | ICD-10-CM | POA: Diagnosis not present

## 2023-09-24 DIAGNOSIS — E782 Mixed hyperlipidemia: Secondary | ICD-10-CM | POA: Diagnosis not present

## 2023-09-24 DIAGNOSIS — I10 Essential (primary) hypertension: Secondary | ICD-10-CM | POA: Diagnosis not present

## 2023-09-24 DIAGNOSIS — I69354 Hemiplegia and hemiparesis following cerebral infarction affecting left non-dominant side: Secondary | ICD-10-CM | POA: Diagnosis not present

## 2023-10-01 NOTE — Progress Notes (Signed)
 [facility] Summary Report / Loop Recorder

## 2023-10-10 DIAGNOSIS — R630 Anorexia: Secondary | ICD-10-CM | POA: Diagnosis not present

## 2023-10-10 DIAGNOSIS — R300 Dysuria: Secondary | ICD-10-CM | POA: Diagnosis not present

## 2023-10-10 DIAGNOSIS — R531 Weakness: Secondary | ICD-10-CM | POA: Diagnosis not present

## 2023-10-16 ENCOUNTER — Ambulatory Visit (INDEPENDENT_AMBULATORY_CARE_PROVIDER_SITE_OTHER)

## 2023-10-16 DIAGNOSIS — I639 Cerebral infarction, unspecified: Secondary | ICD-10-CM

## 2023-10-16 LAB — CUP PACEART REMOTE DEVICE CHECK
Date Time Interrogation Session: 20250723232407
Implantable Pulse Generator Implant Date: 19440924

## 2023-10-17 DIAGNOSIS — G4733 Obstructive sleep apnea (adult) (pediatric): Secondary | ICD-10-CM | POA: Diagnosis not present

## 2023-10-19 ENCOUNTER — Ambulatory Visit: Payer: Self-pay | Admitting: Cardiology

## 2023-10-20 NOTE — Progress Notes (Signed)
 [facility] Summary Report / Loop Recorder

## 2023-10-22 DIAGNOSIS — R899 Unspecified abnormal finding in specimens from other organs, systems and tissues: Secondary | ICD-10-CM | POA: Diagnosis not present

## 2023-10-22 DIAGNOSIS — R8290 Unspecified abnormal findings in urine: Secondary | ICD-10-CM | POA: Diagnosis not present

## 2023-10-22 DIAGNOSIS — N1832 Chronic kidney disease, stage 3b: Secondary | ICD-10-CM | POA: Diagnosis not present

## 2023-10-22 DIAGNOSIS — E7800 Pure hypercholesterolemia, unspecified: Secondary | ICD-10-CM | POA: Diagnosis not present

## 2023-10-22 DIAGNOSIS — I69354 Hemiplegia and hemiparesis following cerebral infarction affecting left non-dominant side: Secondary | ICD-10-CM | POA: Diagnosis not present

## 2023-10-22 DIAGNOSIS — Z79899 Other long term (current) drug therapy: Secondary | ICD-10-CM | POA: Diagnosis not present

## 2023-10-22 DIAGNOSIS — I10 Essential (primary) hypertension: Secondary | ICD-10-CM | POA: Diagnosis not present

## 2023-10-22 DIAGNOSIS — E039 Hypothyroidism, unspecified: Secondary | ICD-10-CM | POA: Diagnosis not present

## 2023-10-27 ENCOUNTER — Ambulatory Visit: Payer: Medicare PPO

## 2023-10-29 DIAGNOSIS — Z961 Presence of intraocular lens: Secondary | ICD-10-CM | POA: Diagnosis not present

## 2023-10-29 DIAGNOSIS — I10 Essential (primary) hypertension: Secondary | ICD-10-CM | POA: Diagnosis not present

## 2023-10-29 DIAGNOSIS — H532 Diplopia: Secondary | ICD-10-CM | POA: Diagnosis not present

## 2023-10-29 DIAGNOSIS — H35033 Hypertensive retinopathy, bilateral: Secondary | ICD-10-CM | POA: Diagnosis not present

## 2023-10-29 DIAGNOSIS — H4922 Sixth [abducent] nerve palsy, left eye: Secondary | ICD-10-CM | POA: Diagnosis not present

## 2023-10-29 DIAGNOSIS — H524 Presbyopia: Secondary | ICD-10-CM | POA: Diagnosis not present

## 2023-10-29 DIAGNOSIS — D3131 Benign neoplasm of right choroid: Secondary | ICD-10-CM | POA: Diagnosis not present

## 2023-10-31 DIAGNOSIS — R399 Unspecified symptoms and signs involving the genitourinary system: Secondary | ICD-10-CM | POA: Diagnosis not present

## 2023-11-17 ENCOUNTER — Ambulatory Visit (INDEPENDENT_AMBULATORY_CARE_PROVIDER_SITE_OTHER)

## 2023-11-17 DIAGNOSIS — I639 Cerebral infarction, unspecified: Secondary | ICD-10-CM | POA: Diagnosis not present

## 2023-11-17 DIAGNOSIS — G4733 Obstructive sleep apnea (adult) (pediatric): Secondary | ICD-10-CM | POA: Diagnosis not present

## 2023-11-18 LAB — CUP PACEART REMOTE DEVICE CHECK
Date Time Interrogation Session: 20250823231950
Implantable Pulse Generator Implant Date: 19440924

## 2023-11-20 ENCOUNTER — Ambulatory Visit: Admitting: Dermatology

## 2023-11-23 ENCOUNTER — Ambulatory Visit: Payer: Self-pay | Admitting: Cardiology

## 2023-12-01 ENCOUNTER — Ambulatory Visit: Payer: Medicare PPO

## 2023-12-02 ENCOUNTER — Ambulatory Visit: Admitting: Dermatology

## 2023-12-02 ENCOUNTER — Encounter: Payer: Self-pay | Admitting: Dermatology

## 2023-12-02 DIAGNOSIS — L578 Other skin changes due to chronic exposure to nonionizing radiation: Secondary | ICD-10-CM

## 2023-12-02 DIAGNOSIS — L299 Pruritus, unspecified: Secondary | ICD-10-CM | POA: Diagnosis not present

## 2023-12-02 DIAGNOSIS — W908XXA Exposure to other nonionizing radiation, initial encounter: Secondary | ICD-10-CM

## 2023-12-02 DIAGNOSIS — L304 Erythema intertrigo: Secondary | ICD-10-CM

## 2023-12-02 MED ORDER — KETOCONAZOLE 2 % EX CREA: TOPICAL_CREAM | CUTANEOUS | 1 refills | Status: AC

## 2023-12-02 MED ORDER — HYDROCORTISONE 2.5 % EX CREA: TOPICAL_CREAM | CUTANEOUS | 1 refills | Status: AC

## 2023-12-02 MED ORDER — LIDOCAINE 5 % EX OINT: TOPICAL_OINTMENT | CUTANEOUS | 1 refills | Status: AC

## 2023-12-02 NOTE — Patient Instructions (Signed)

## 2023-12-02 NOTE — Progress Notes (Signed)
 Follow Up Visit   Subjective  Teresa [name] is a 83 y.o. female who presents for the following: Rash - on the neck that wraps the chest and inframammary areas that started a few months ago, is present all the time, and can be itchy. She did have rash appear on the L side of her face and still has some residual rash on the eyelid. Pt hasn't used anything topically, she does try to avoid scratching. Pt also c/o itchy scalp that started within the last month and she would like to discussed treatment options. Currently she uses Treseme shampoo and conditioner.  The following portions of the chart were reviewed this encounter and updated as appropriate: medications, allergies, medical history  Review of Systems:  No other skin or systemic complaints except as noted in HPI or Assessment and Plan.  Objective  Well appearing patient in no apparent distress; mood and affect are within normal limits.  A focused examination was performed of the following areas: The face, scalp, trunk, and neck  Relevant exam findings are noted in the Assessment and Plan.    Assessment & Plan  ACTINIC ELASTOSIS   INTERTRIGO   Related Medications ketoconazole  (NIZORAL ) 2 % cream Apply to rash on the chest BID PRN. hydrocortisone  2.5 % cream Apply BID PRN rash on the chest. SCALP PRURITUS   Related Medications lidocaine  (XYLOCAINE ) 5 % ointment Apply to itchy areas on and around the scalp BID PRN. PRURITUS   Related Medications lidocaine  (XYLOCAINE ) 5 % ointment Apply to itchy areas on and around the scalp BID PRN.  Chronic itching of the scalp, suspicion for neck arthritis as etiology Exam: scalp largely clear. Mild irritation at occipital scalp. Patient reports pruritus more diffusely  Plan: Recommend lidocaine  ointment to itchy areas of the scalp as needed or lidocaine  patches to aa's around surgery site.  Open wound on the ears - crusted papules R mid helix: pt states due to curling iron,  no tx needed.    INTERTRIGO Exam: pink slightly macerated patches in L inframammary  Chronic and persistent condition with duration or expected duration over one year. Condition is symptomatic/ bothersome to patient. Not currently at goal.  Intertrigo is a chronic recurrent rash that occurs in skin fold areas that may be associated with friction; heat; moisture; yeast; fungus; and bacteria.  It is exacerbated by increased movement / activity; sweating; and higher atmospheric temperature.  Use of an absorbant powder such as Zeasorb AF powder or other OTC antifungal powder to the area daily can prevent rash recurrence. Other options to help keep the area dry include blow drying the area after bathing or using antiperspirant products such as Duradry sweat minimizing gel.  Treatment Plan: Start Hydrocortisone  2.5% cream mix with ketoconazole  2% cream BID to aa's chest and inframammary areas.   ACTINIC DAMAGE with pruritus - chronic, secondary to cumulative UV radiation exposure/sun exposure over time on chest. Can cause pruritus - diffuse scaly erythematous macules with underlying dyspigmentation - Recommend daily broad spectrum sunscreen SPF 30+ to sun-exposed areas, reapply every 2 hours as needed.  - Recommend staying in the shade or wearing long sleeves, sun glasses (UVA+UVB protection) and wide brim hats (4-inch brim around the entire circumference of the hat). - Call for new or changing lesions. - lidocaine  ointment BID prn  Return for appointment as scheduled.  LILLETTE Rosina Mayans, CMA, am acting as scribe for Boneta Sharps, MD .  Documentation: I have reviewed the above documentation for accuracy and  completeness, and I agree with the above.  Boneta Sharps, MD

## 2023-12-10 NOTE — Progress Notes (Signed)
 Remote Loop Recorder Transmission

## 2023-12-18 ENCOUNTER — Ambulatory Visit (INDEPENDENT_AMBULATORY_CARE_PROVIDER_SITE_OTHER)

## 2023-12-18 DIAGNOSIS — G4733 Obstructive sleep apnea (adult) (pediatric): Secondary | ICD-10-CM | POA: Diagnosis not present

## 2023-12-18 DIAGNOSIS — I639 Cerebral infarction, unspecified: Secondary | ICD-10-CM

## 2023-12-18 LAB — CUP PACEART REMOTE DEVICE CHECK
Date Time Interrogation Session: 20250924231816
Implantable Pulse Generator Implant Date: 19440924

## 2023-12-20 ENCOUNTER — Ambulatory Visit: Payer: Self-pay | Admitting: Cardiology

## 2023-12-22 NOTE — Progress Notes (Signed)
 Remote Loop Recorder Transmission

## 2023-12-25 NOTE — Progress Notes (Signed)
 Remote Loop Recorder Transmission

## 2024-01-05 ENCOUNTER — Ambulatory Visit: Payer: Medicare PPO

## 2024-01-06 DIAGNOSIS — G4733 Obstructive sleep apnea (adult) (pediatric): Secondary | ICD-10-CM | POA: Diagnosis not present

## 2024-01-06 DIAGNOSIS — Z8673 Personal history of transient ischemic attack (TIA), and cerebral infarction without residual deficits: Secondary | ICD-10-CM | POA: Diagnosis not present

## 2024-01-06 DIAGNOSIS — G8194 Hemiplegia, unspecified affecting left nondominant side: Secondary | ICD-10-CM | POA: Diagnosis not present

## 2024-01-19 ENCOUNTER — Ambulatory Visit (INDEPENDENT_AMBULATORY_CARE_PROVIDER_SITE_OTHER)

## 2024-01-19 ENCOUNTER — Ambulatory Visit: Payer: Self-pay | Admitting: Cardiology

## 2024-01-19 DIAGNOSIS — I639 Cerebral infarction, unspecified: Secondary | ICD-10-CM | POA: Diagnosis not present

## 2024-01-19 LAB — CUP PACEART REMOTE DEVICE CHECK
Date Time Interrogation Session: 20251026232014
Implantable Pulse Generator Implant Date: 19440924

## 2024-01-27 NOTE — Progress Notes (Signed)
 Remote Loop Recorder Transmission

## 2024-01-28 DIAGNOSIS — Z79899 Other long term (current) drug therapy: Secondary | ICD-10-CM | POA: Diagnosis not present

## 2024-01-28 DIAGNOSIS — E782 Mixed hyperlipidemia: Secondary | ICD-10-CM | POA: Diagnosis not present

## 2024-01-28 DIAGNOSIS — R8290 Unspecified abnormal findings in urine: Secondary | ICD-10-CM | POA: Diagnosis not present

## 2024-01-28 DIAGNOSIS — E039 Hypothyroidism, unspecified: Secondary | ICD-10-CM | POA: Diagnosis not present

## 2024-01-28 DIAGNOSIS — I10 Essential (primary) hypertension: Secondary | ICD-10-CM | POA: Diagnosis not present

## 2024-01-28 DIAGNOSIS — N1832 Chronic kidney disease, stage 3b: Secondary | ICD-10-CM | POA: Diagnosis not present

## 2024-01-28 DIAGNOSIS — Z1211 Encounter for screening for malignant neoplasm of colon: Secondary | ICD-10-CM | POA: Diagnosis not present

## 2024-01-28 DIAGNOSIS — I69354 Hemiplegia and hemiparesis following cerebral infarction affecting left non-dominant side: Secondary | ICD-10-CM | POA: Diagnosis not present

## 2024-01-29 DIAGNOSIS — Z79899 Other long term (current) drug therapy: Secondary | ICD-10-CM | POA: Diagnosis not present

## 2024-01-29 DIAGNOSIS — I10 Essential (primary) hypertension: Secondary | ICD-10-CM | POA: Diagnosis not present

## 2024-01-29 DIAGNOSIS — N1832 Chronic kidney disease, stage 3b: Secondary | ICD-10-CM | POA: Diagnosis not present

## 2024-01-29 DIAGNOSIS — E039 Hypothyroidism, unspecified: Secondary | ICD-10-CM | POA: Diagnosis not present

## 2024-01-29 DIAGNOSIS — I69354 Hemiplegia and hemiparesis following cerebral infarction affecting left non-dominant side: Secondary | ICD-10-CM | POA: Diagnosis not present

## 2024-01-29 DIAGNOSIS — Z1211 Encounter for screening for malignant neoplasm of colon: Secondary | ICD-10-CM | POA: Diagnosis not present

## 2024-01-29 DIAGNOSIS — E782 Mixed hyperlipidemia: Secondary | ICD-10-CM | POA: Diagnosis not present

## 2024-01-29 DIAGNOSIS — R8290 Unspecified abnormal findings in urine: Secondary | ICD-10-CM | POA: Diagnosis not present

## 2024-01-30 ENCOUNTER — Ambulatory Visit: Payer: Self-pay | Admitting: Cardiology

## 2024-01-30 DIAGNOSIS — Z1211 Encounter for screening for malignant neoplasm of colon: Secondary | ICD-10-CM | POA: Diagnosis not present

## 2024-02-09 ENCOUNTER — Ambulatory Visit: Payer: Medicare PPO

## 2024-02-19 ENCOUNTER — Ambulatory Visit

## 2024-02-20 ENCOUNTER — Ambulatory Visit

## 2024-02-20 DIAGNOSIS — I639 Cerebral infarction, unspecified: Secondary | ICD-10-CM | POA: Diagnosis not present

## 2024-02-21 LAB — CUP PACEART REMOTE DEVICE CHECK
Date Time Interrogation Session: 20251127231818
Implantable Pulse Generator Implant Date: 19440924

## 2024-02-23 ENCOUNTER — Ambulatory Visit: Payer: Self-pay | Admitting: Cardiology

## 2024-02-25 NOTE — Progress Notes (Signed)
 Remote Loop Recorder Transmission

## 2024-03-15 ENCOUNTER — Ambulatory Visit: Payer: Medicare PPO

## 2024-03-22 ENCOUNTER — Ambulatory Visit

## 2024-03-22 DIAGNOSIS — I639 Cerebral infarction, unspecified: Secondary | ICD-10-CM | POA: Diagnosis not present

## 2024-03-23 LAB — CUP PACEART REMOTE DEVICE CHECK
Date Time Interrogation Session: 20251228231915
Implantable Pulse Generator Implant Date: 19440924

## 2024-03-27 ENCOUNTER — Ambulatory Visit: Payer: Self-pay | Admitting: Cardiology

## 2024-03-31 NOTE — Progress Notes (Signed)
 Remote Loop Recorder Transmission

## 2024-04-19 ENCOUNTER — Ambulatory Visit: Payer: Medicare PPO

## 2024-04-22 ENCOUNTER — Ambulatory Visit

## 2024-04-22 DIAGNOSIS — I639 Cerebral infarction, unspecified: Secondary | ICD-10-CM

## 2024-04-22 LAB — CUP PACEART REMOTE DEVICE CHECK
Date Time Interrogation Session: 20260128231231
Implantable Pulse Generator Implant Date: 19440924

## 2024-04-29 NOTE — Progress Notes (Signed)
 Remote Loop Recorder Transmission

## 2024-05-23 ENCOUNTER — Ambulatory Visit

## 2024-05-24 ENCOUNTER — Ambulatory Visit: Payer: Medicare PPO

## 2024-05-24 ENCOUNTER — Ambulatory Visit

## 2024-06-22 ENCOUNTER — Encounter: Admitting: Dermatology

## 2024-06-23 ENCOUNTER — Ambulatory Visit

## 2024-06-24 ENCOUNTER — Ambulatory Visit

## 2024-06-28 ENCOUNTER — Ambulatory Visit: Payer: Medicare PPO

## 2024-07-24 ENCOUNTER — Ambulatory Visit

## 2024-07-26 ENCOUNTER — Ambulatory Visit

## 2024-08-02 ENCOUNTER — Ambulatory Visit: Payer: Medicare PPO

## 2024-08-24 ENCOUNTER — Ambulatory Visit

## 2024-08-26 ENCOUNTER — Ambulatory Visit

## 2024-09-06 ENCOUNTER — Ambulatory Visit: Payer: Medicare PPO

## 2024-09-24 ENCOUNTER — Ambulatory Visit

## 2024-09-27 ENCOUNTER — Ambulatory Visit

## 2024-10-11 ENCOUNTER — Ambulatory Visit: Payer: Medicare PPO

## 2024-10-25 ENCOUNTER — Ambulatory Visit

## 2024-11-15 ENCOUNTER — Ambulatory Visit: Payer: Medicare PPO

## 2024-11-25 ENCOUNTER — Ambulatory Visit

## 2024-12-20 ENCOUNTER — Ambulatory Visit: Payer: Medicare PPO

## 2024-12-26 ENCOUNTER — Ambulatory Visit

## 2025-01-26 ENCOUNTER — Ambulatory Visit

## 2025-02-26 ENCOUNTER — Ambulatory Visit
